# Patient Record
Sex: Female | Born: 1965
Health system: Western US, Academic
[De-identification: ages and names within clinical notes are randomized; demographics above are authoritative.]

---

## 2018-12-03 ENCOUNTER — Ambulatory Visit: Payer: PRIVATE HEALTH INSURANCE

## 2018-12-03 ENCOUNTER — Inpatient Hospital Stay
Admit: 2018-12-03 | Discharge: 2018-12-03 | Disposition: A | Discharge status: Home / Self Care | Payer: PRIVATE HEALTH INSURANCE | Source: Home / Self Care

## 2018-12-03 DIAGNOSIS — R1013 Epigastric pain: Secondary | ICD-10-CM

## 2018-12-03 LAB — Lipase: LIPASE: 38 U/L (ref 9–63)

## 2018-12-03 LAB — Basic Metabolic Panel
ANION GAP: 14 mmol/L (ref 8–19)
GFR ESTIMATE FOR NON-AFRICAN AMERICAN: 54 mL/min/{1.73_m2} (ref 135–146)

## 2018-12-03 LAB — Hepatic Funct Panel: ALBUMIN FORHEPFUNCTPNL: 4.4 g/dL (ref 3.9–5.0)

## 2018-12-03 LAB — B-Type Natriuretic Peptide: BNP: 20 pg/mL (ref <100)

## 2018-12-03 LAB — CK, Total: CREATINE PHOSPHOKINASE, TOTAL: 123 U/L (ref 38–282)

## 2018-12-03 LAB — Differential Automated: LYMPHOCYTE PERCENT, AUTO: 37.8 (ref 0.00–0.10)

## 2018-12-03 LAB — Troponin I: TROPONIN I: <0.04 ng/mL (ref <0.1)

## 2018-12-03 LAB — Prothrombin Time Panel: PROTHROMBIN TIME: 12.5 s (ref 11.5–14.4)

## 2018-12-03 LAB — APTT: APTT: 32.2 s (ref 24.4–36.2)

## 2018-12-03 LAB — CBC: PLATELET COUNT, AUTO: 464 10*3/uL — ABNORMAL HIGH (ref 143–398)

## 2018-12-03 MED ORDER — ALUM & MAG HYDROXIDE-SIMETH 400-400-40 MG/5ML PO SUSP
10 mL | Freq: Four times a day (QID) | ORAL | 0 refills | Status: SS | PRN
Start: 2018-12-03 — End: 2019-06-22

## 2018-12-03 MED ADMIN — ONDANSETRON HCL 4 MG/2ML IJ SOLN: 4 mg | INTRAVENOUS | @ 16:00:00 | Stop: 2018-12-03 | NDC 00641607825

## 2018-12-03 MED ADMIN — ALUM & MAG HYDROXIDE-SIMETH 400-400-40 MG/5ML PO SUSP: 30 mL | ORAL | @ 16:00:00 | Stop: 2018-12-03 | NDC 00121176230

## 2018-12-03 MED ADMIN — LIDOCAINE VISCOUS HCL 2 % MT SOLN: 15 mL | ORAL | @ 16:00:00 | Stop: 2018-12-03 | NDC 50383077517

## 2018-12-03 MED ADMIN — MORPHINE SULFATE (PF) 4 MG/ML IV SOLN: 4 mg | INTRAVENOUS | @ 16:00:00 | Stop: 2018-12-03 | NDC 00641612525

## 2018-12-03 NOTE — ED Notes
COLLECTIVE?NOTIFICATION?12/03/2018 06:57?Donna Gross?MRN: [id number]    [name]:   AVW:[id number]  Account [id number]  Billing Account [id number]      Criteria Met      6 Visits in 180 Days    2 Visits in 30 Days    Security and Safety  No recent Security Events currently on file    ED Care Guidelines  There are currently no ED Care Guidelines for this patient. Please check your facility's medical records system.            E.D. Visit Count (12 mo.)  Facility Visits   [facility] [place] 6   [name] 10   [facility] 1   [place] [place] 15   Total 32   Note: Visits indicate total known visits.      Recent Emergency Department Visit Summary  Showing 10 most recent visits out of 32 in the past 12 months  Date Facility [facility] Type Diagnoses or Chief Complaint   Dec 03, 2018 [facility] [place] [facility]     Dec 01, 2018 [name] [place] Emergency  Chief Complaint: CP, SOB, BACK PAIN    Nov 20, 2018 [facility]. [place] [facility]  Chief Complaint: CHEST PAIN    Oct 29, 2018 [facility] [name] [place] Emergency  Chief Complaint: CHEST, STOMACH, AND BACK PAIN    Sep 22, 2018 [facility] [place]. [place] [facility]  Chief Complaint: CHEST PAIN,SOB    Aug 02, 2018 [facility] [name] [place] Emergency  Chief Complaint: CHEST [facility] PAIN    Jul 01, 2018 [facility] [place] [facility]      Chronic obstructive pulmonary disease w (acute) exacerbation      Hyperlipidemia, unspecified      Ileostomy status      Athscl heart disease of native coronary artery w/o ang pctrs      Nicotine dependence, cigarettes, uncomplicated      Pure hypercholesterolemia, unspecified      Chest pain, unspecified      Jun 21, 2018 [name] [place] Emergency  Chief Complaint: CHEST PAIN    Jun 17, 2018 [facility] [place]. [place] Emergency Allergy status to analgesic agent status      Other chronic pain      Gastro-esophageal reflux disease without esophagitis      Other specified postprocedural states      Other chest pain      Nicotine dependence, cigarettes, uncomplicated      Chronic obstructive pulmonary disease, unspecified      Fibromyalgia      Personal history of peptic ulcer disease      Anxiety disorder, unspecified      Jun 09, 2018 [facility] [place] [facility]      Chronic obstructive pulmonary disease, unspecified      Anxiety disorder, unspecified      Nicotine dependence, cigarettes, uncomplicated      Chest pain, unspecified      Dyspnea, unspecified      Fibromyalgia      Acquired absence of other specified parts of digestive tract      Other chest pain      Allergy status to analgesic agent status      Colostomy status  Recent Inpatient Visit Summary  Date [facility] Type Diagnoses or Chief Complaint   Nov 20, 2018 [facility] [name] [place] Telemetry      Other chest pain      Colostomy status      Gastro-esophageal reflux disease without esophagitis      Major depressive disorder, single episode, unspecified      Other specified counseling      Chronic obstructive pulmonary disease, unspecified      Nicotine dependence, cigarettes, uncomplicated      Gastric ulcer, unsp as acute or chronic, w/o hemor or perf      Allergy status to oth drug/meds/biol subst status      Anxiety disorder, unspecified      Sep 22, 2018 [facility] [place]. [place] Tele[facility]      Essential (primary) hypertension      Major depressive disorder, single episode, unspecified      Generalized anxiety disorder      Emphysema, unspecified      Fibromyalgia      Unspecified asthma, uncomplicated      Pure hypercholesterolemia, unspecified      Nicotine dependence, cigarettes, uncomplicated      Gastro-esophageal reflux disease without esophagitis      Other chest pain Aug 02, 2018 [facility] [place] [place] Tele[facility]      Adult hypertrophic pyloric stenosis      Personal history of peptic ulcer disease      Diaphragmatic hernia without obstruction or gangrene      Nicotine dependence, cigarettes, uncomplicated      Colostomy status      Chronic obstructive pulmonary disease w (acute) exacerbation      Unspecified abdominal pain      Essential (primary) hypertension      Gastritis, unspecified, without bleeding      Gastro-esophageal reflux disease without esophagitis      Apr 17, 2018 [facility] [place] [place] Tele[facility]      Oth disorders of electrolyte and fluid balance, NEC      Anxiety disorder, unspecified      Gastritis, unspecified, without bleeding      Major depressive disorder, single episode, unspecified      Essential (primary) hypertension      Peptic ulc, site unsp, unsp as ac or chr, w/o hemor or perf      Chest pain, unspecified      Other chronic pain      Gastro-esophageal reflux disease without esophagitis      Acute ischemic heart disease, unspecified      Feb 18, 2018 [facility] [place] [facility]      Acute kidney failure, unspecified      Anemia, unspecified      Alcohol-induced chronic pancreatitis      Unspecified chronic gastritis without bleeding      Dehydration      Deficiency of other specified B group vitamins      Acute pancreatitis without necrosis or infection, unsp      Personal history of peptic ulcer disease      Dvrtclos of sm int w/o perforation or abscess w/o bleeding          Care Team  [facility] Phone Fax Service Dates   Coy Saunas , M.D. Family Medicine   Current    Brain Hilts , M.D. Family Medicine   Current    [facility] [phone]  [phone] Aug 21, 2018 - Current    [facility] Primary [facility]   Current    [facility] Primary Care [phone] Aug 21, 2018 - Current      Collective Portal This patient has registered at the [facility] Emergency Department   For more information visit: [email or web address]   PLEASE NOTE:     1.   Any care recommendations and other clinical information are provided as guidelines or for historical purposes only, and providers should exercise their own clinical judgment when providing care.    2.   You may only use this information for purposes of treatment, payment or health care operations activities, and subject to the limitations of applicable Collective Policies.    3.   You should consult directly with the organization that provided a care guideline or other clinical history with any questions about additional information or accuracy or completeness of information provided.    ? 2019 [place], [employer]. - [email or web address]

## 2018-12-04 NOTE — ED Provider Notes
[facility]  Emergency Department Service Report    Donna Gross 52 y.o. female , presents with Chest Pain and Abdominal Pain      Triage   Arrived on 12/03/2018 at 6:57 AM   Arrived by Walk-in [14]    ED Triage Vitals [12/03/18 0700]   Temp Temp Source BP Heart Rate Resp SpO2 O2 Device Pain Score Weight   36.8 ???C (98.2 ???F) Oral 118/92 86 16 98 % None (Room air) Ten --       Pre hospital care:       Allergies   Allergen Reactions   ??? Ibuprofen      GI discomfort         History   The history is provided by the patient and medical records.   Chest Pain   The chest pain began 5 - 7 days ago. Chest pain occurs intermittently. The chest pain is unchanged. At its most intense, the chest pain is at 5/10. The chest pain is currently at 5/10. The severity of the chest pain is moderate. The quality of the pain is described as aching. The pain radiates to the epigastrium. Exacerbated by: eating. Primary symptoms include abdominal pain. Pertinent negatives for primary symptoms include no fever, no fatigue, no syncope, no shortness of breath, no cough, no wheezing, no palpitations, no nausea, no vomiting, no dizziness and no altered mental status.   Pertinent negatives for associated symptoms include no claudication, no diaphoresis, no lower extremity edema, no near-syncope, no numbness, no orthopnea, no paroxysmal nocturnal dyspnea and no weakness. She tried nothing for the symptoms. Risk factors include smoking/tobacco exposure.   Her past medical history is significant for COPD, hypertension and stimulant use.   Procedure history is positive for echocardiogram and exercise treadmill test.              Past Medical History:   Diagnosis Date   ??? Anxiety    ??? Depression    ??? Emphysema, unspecified (HCC/RAF)    ??? Fibromyalgia    ??? Gastritis    ??? GERD (gastroesophageal reflux disease)    ??? Pancreatitis         History reviewed. No pertinent surgical history.     Past Family History family history includes Diabetes in her brother and father; Heart disease in her mother.     Past Social History   she reports that she has been smoking. She does not have any smokeless tobacco history on file. She reports that she does not drink alcohol, use drugs, or engage in sexual activity.     Review of Systems   Constitutional: Negative for diaphoresis, fatigue and fever.   Respiratory: Negative for cough, shortness of breath and wheezing.    Cardiovascular: Positive for chest pain. Negative for palpitations, orthopnea, claudication, syncope and near-syncope.   Gastrointestinal: Positive for abdominal pain. Negative for nausea and vomiting.   Neurological: Negative for dizziness, weakness and numbness.   All other systems reviewed and are negative.      Physical Exam   Physical Exam  Vitals signs and nursing note reviewed.   Constitutional:       General: She is in acute distress.      Appearance: She is well-developed.   HENT:      Head: Normocephalic and atraumatic.   Eyes:      Conjunctiva/sclera: Conjunctivae normal.   Neck:      Musculoskeletal: Normal range of motion and neck supple.  Cardiovascular:      Rate and Rhythm: Normal rate and regular rhythm.   Pulmonary:      Effort: Pulmonary effort is normal. No respiratory distress.   Abdominal:      Palpations: Abdomen is soft.      Tenderness: There is no abdominal tenderness.      Comments: Abdominal surgical scars     Musculoskeletal: Normal range of motion.   Skin:     General: Skin is warm and dry.   Neurological:      Mental Status: She is alert.      Comments: Moving all four extremities         ED Course          Laboratory Results     Labs Reviewed   CBC (PERFORMABLE) - Abnormal; Notable for the following components:       Result Value    Red Cell Distribution Width-SD 51.7 (*)     Platelet Count, Auto 464 (*)     Mean Platelet Volume 8.7 (*)     All other components within normal limits   BNP - Normal   PROTHROMBIN TIME PANEL - Normal APTT - Normal   CK, TOTAL - Normal   LIPASE - Normal   HEPATIC FUNCT PANEL - Normal   CBC & AUTO DIFFERENTIAL    Narrative:     The following orders were created for panel order CBC & Plt & Diff.  Procedure                               Abnormality         Status                     ---------                               -----------         ------                     HYQ[[id number]]                          Abnormal            Final result               Differential, Automated[[id number]]                          Final result                 Please view results for these tests on the individual orders.   BASIC METABOLIC PANEL   TROPONIN I   DIFFERENTIAL, AUTOMATED (PERFORMABLE)       Imaging Results     XR chest ap portable (1 view)   Final Result by Albin Fischer, MD (12/14 0837)   IMPRESSION:   Heart size is normal. Mediastinal contours are unremarkable.   Mild hyperinflation. No acute airspace consolidation or edema.   No pleural effusion or pneumothorax.   No acute osseous abnormality.      Signed by: Albin Fischer   12/03/2018 8:37 AM          Administered Medications     Medication Administration from 12/03/2018 0657 to 12/03/2018 1610  Date/Time Order Dose Route Action Action by Comments     12/03/2018 0740 aluminum-magnesium hydroxide-simethicone 400-400-40 mg/5 mL susp 30 mL 30 mL Oral Given Dalia Heading, RN      12/03/2018 0740 lidocaine viscous 2% oral soln 15 mL 15 mL Oral Given Dalia Heading, RN      12/03/2018 0740 morphine PF 4 mg/mL inj 4 mg 4 mg IV Push Given Dalia Heading, RN      12/03/2018 0740 ondansetron 4 mg/2 mL inj 4 mg 4 mg IV Push Given Dalia Heading, RN           Procedures   Procedures    MDM     My differential diagnosis for Epigastric abdominal pain includes Anginal equivalent, Bowel Obstruction, Cholecystitis, Pancreatitis, Gastritis, Peptic Ulcer Disease, Abdominal Aortic Aneurysm, etc.    Cardiac work up does not demonstrate any evidence of arrhythmias and coronary ischemia.  Trop x 1 was normal.  EKG read by me in real time as no arrhythmias.    Heart Score 0-3 presents low risk for adverse cardiac event (.9-1.7%).     BMP is without electrolyte abnormalities.  No e/o infection.  Afebrile. No significant leukocytosis.  Hemoglobin is stable.  No evidence of active hemorrhage.    LFT's and lipase are normal arguing against hepatobiliary disease and pancreatitis.    CXR read by me in real time as no acute disease.    Patient has a non-surgical abdomen on serial exams. CT Scan of the abdomen and pelvis is not indicated on ER visit to exclude emergent disease process.    All laboratories and tests have been reviewed by me.  This patient has been serially re-evaluated and examined by me during their time in the ER.  The patient is pain free, ambulatory, and tolerating PO on discharge.     I believe that this patient is appropriate for timely outpatient follow up with a primary doctor.  This patient has agreed to adhere to the follow up plan.  They have also agreed to return to the ER immediately if any concerns or worsening symptoms.    Data Reviewed/Counseling: I have reviewed the patient's vital signs, nursing notes, old medical records and outside studies. I had a detailed discussion with the patient regarding the historical points, exam findings, and any diagnostic results supporting the discharge diagnosis. I also discussed lab results, radiology results, the need for outpatient follow-up and the need to return to the ED if symptoms worsen or if there are any questions or concerns that arise at home.          Clinical Impression     1. Abdominal pain, acute, epigastric        Prescriptions     Discharge Medication List as of 12/03/2018  9:27 AM      START taking these medications    Details   aluminum-magnesium hydroxide-simethicone 400-400-40 mg/5 mL suspension Take 10 mLs by mouth every six (6) hours as needed., Starting Sat 12/03/2018, Print Disposition and Follow-up   Disposition: Discharge [1]    No future appointments.    Follow up with:  [facility]  [address] [place] [address]  [phone]    Call in 2 days  for follow up      Return precautions are specified on After Visit Summary.                 Fonnie Mu., MD  12/03/18 [phone]

## 2019-03-09 ENCOUNTER — Ambulatory Visit: Payer: PRIVATE HEALTH INSURANCE

## 2019-03-09 ENCOUNTER — Inpatient Hospital Stay
Admit: 2019-03-09 | Discharge: 2019-03-10 | Disposition: A | Discharge status: Other Acute Inpatient Hospital | Payer: PRIVATE HEALTH INSURANCE | Source: Home / Self Care

## 2019-03-09 DIAGNOSIS — K267 Chronic duodenal ulcer without hemorrhage or perforation: Secondary | ICD-10-CM

## 2019-03-09 DIAGNOSIS — K269 Duodenal ulcer, unspecified as acute or chronic, without hemorrhage or perforation: Secondary | ICD-10-CM

## 2019-03-09 LAB — CBC: NUCLEATED RBC%, AUTOMATED: 0 (ref 0.00–0.00)

## 2019-03-09 LAB — Magnesium: MAGNESIUM: 2.2 meq/L — ABNORMAL HIGH (ref 1.4–1.9)

## 2019-03-09 LAB — Basic Metabolic Panel
ANION GAP: 17 mmol/L (ref 8–19)
CHLORIDE: 98 mmol/L (ref 96–106)

## 2019-03-09 LAB — Extra Red Top (Plastic)

## 2019-03-09 LAB — Troponin I: TROPONIN INTERPRETATION: NEGATIVE ng/mL (ref <0.1)

## 2019-03-09 LAB — Phosphorus: PHOSPHORUS: 4.4 mg/dL (ref 2.3–4.4)

## 2019-03-09 LAB — Hepatic Funct Panel: TOTAL PROTEIN: 7.7 g/dL (ref 6.1–8.2)

## 2019-03-09 LAB — Extra Light Blue Top

## 2019-03-09 LAB — Lipase: LIPASE: 26 U/L (ref 9–63)

## 2019-03-09 MED ORDER — SUCRALFATE 1 G PO TABS
1 g | ORAL_TABLET | Freq: Four times a day (QID) | ORAL | 1 refills | Status: SS
Start: 2019-03-09 — End: 2019-06-24

## 2019-03-09 MED ORDER — PANTOPRAZOLE SODIUM 40 MG PO TBEC
40 mg | ORAL_TABLET | Freq: Two times a day (BID) | ORAL | 1 refills | Status: AC
Start: 2019-03-09 — End: ?

## 2019-03-09 MED ADMIN — LACTATED RINGERS IV BOLUS: 1000 mL | INTRAVENOUS | @ 18:00:00 | Stop: 2019-03-09 | NDC 00338011704

## 2019-03-09 MED ADMIN — ONDANSETRON HCL 4 MG/2ML IJ SOLN: 4 mg | INTRAVENOUS | @ 18:00:00 | Stop: 2019-03-09 | NDC 00641607825

## 2019-03-09 MED ADMIN — PANTOPRAZOLE SODIUM 40 MG IV SOLR: 80 mg | INTRAVENOUS | @ 18:00:00 | Stop: 2019-03-09

## 2019-03-09 MED ADMIN — HYDROMORPHONE HCL 1 MG/ML IJ SOLN: .5 mg | INTRAVENOUS | @ 21:00:00 | Stop: 2019-03-09 | NDC 00409128331

## 2019-03-09 MED ADMIN — LIDOCAINE VISCOUS HCL 2 % MT SOLN: 15 mL | ORAL | @ 18:00:00 | Stop: 2019-03-09 | NDC 50383077517

## 2019-03-09 MED ADMIN — ALUM & MAG HYDROXIDE-SIMETH 400-400-40 MG/5ML PO SUSP: 30 mL | ORAL | @ 18:00:00 | Stop: 2019-03-09 | NDC 00121176230

## 2019-03-09 MED ADMIN — HYDROMORPHONE HCL 1 MG/ML IJ SOLN: .5 mg | INTRAVENOUS | @ 19:00:00 | Stop: 2019-03-09 | NDC 00409128331

## 2019-03-09 MED ADMIN — OXYCODONE HCL 5 MG PO TABS: 10 mg | ORAL | @ 23:00:00 | Stop: 2019-03-09

## 2019-03-09 MED ADMIN — HYDROMORPHONE HCL 1 MG/ML IJ SOLN: .5 mg | INTRAVENOUS | @ 23:00:00 | Stop: 2019-03-09 | NDC 00409128331

## 2019-03-09 MED ADMIN — OXYCODONE HCL 5 MG/5ML PO SOLN: 10 mg | ORAL | @ 23:00:00 | Stop: 2019-03-10

## 2019-03-09 MED ADMIN — HYDROMORPHONE HCL 1 MG/ML IJ SOLN: .5 mg | INTRAVENOUS | @ 18:00:00 | Stop: 2019-03-09 | NDC 00409128331

## 2019-03-09 NOTE — ED Notes
Report from [place], RN for one hour lunch coverage.

## 2019-03-09 NOTE — ED Notes
COLLECTIVE?NOTIFICATION?03/09/2019 10:21?Donna Gross?MRN: [id number]    [facility] Donna Gross's patient encounter information:   AVW:?[id number]  Account [id number]  Billing Account [id number]      Criteria Met      6 Visits in 180 Days    History of Sepsis Dx    2 Visits in 30 Days    Security and Safety  No recent Security Events currently on file    ED Care Guidelines  There are currently no ED Care Guidelines for this patient. Please check your facility's medical records system.    Flags      History of Sepsis - Patient has received Gross diagnosis of Sepsis from an acute or post-acute setting. Apply appropriate clinical planning practices; to learn more visit [email or web address] / Attributed By: [employer] / Attributed On: 12/22/2018           E.D. Visit Count (12 mo.)  Facility Visits   Donna Gross - [place] 2   Donna Gross 11   Donna Gross 2   Donna Gross 5   Total 20   Note: Visits indicate total known visits.      Recent Emergency Department Visit Summary  Showing 10 most recent visits out of 20 in the past 12 months  Date Facility [facility] Type Diagnoses or Chief Complaint   Mar 09, 2019 [facility]. [place] Emergency     Feb 18, 2019 Donna [facility] Gross Donna Gross. [place] Emergency  Chief Complaint: CHEST PAIN,STOMACH PAIN, BACK PAIN    Feb 05, 2019 Donna Gross [facility] [place]. [place] Emergency      Other long term (current) drug therapy      Peptic ulc, site unsp, unsp as ac or chr, w/o hemor or perf      Allergy status to analgesic agent status      Personal history of other diseases of the digestive system      Colostomy status      Epigastric pain      Gastro-esophageal reflux disease without esophagitis      Chronic obstructive pulmonary disease, unspecified      Jan 14, 2019 Donna Gross [place] Emergency  Chief Complaint: ABD PAIN    Dec 31, 2018 Donna Gross Emergency Unspecified abdominal pain      Peptic ulc, site unsp, unsp as ac or chr, w/o hemor or perf      Chronic obstructive pulmonary disease, unspecified      Chest pain, unspecified      Dec 03, 2018 Donna Gross [place] Emergency      1. Epigastric pain      2. Chest Pain      3. 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  Donna Gross Emergency  Chief Complaint: CHEST PAIN    Oct 29, 2018 Donna Gross [facility] Donna Gross. [place] Emergency      Personal history of peptic ulcer disease      Colostomy status      Other specified postprocedural states      Allergy status to analgesic agent status      Nicotine dependence, cigarettes, uncomplicated      Other chest pain      Unspecified abdominal pain      Low back pain      Gastro-esophageal reflux disease without esophagitis      Heart failure, unspecified      Sep 22, 2018 Donna Gross [facility] Emergency  Chief Complaint: CHEST PAIN,SOB        Recent Inpatient Visit Summary  Date Facility [facility] Type Diagnoses or Chief Complaint   Feb 18, 2019 Donna Gross [facility] Donna Gross. [place] Tele[facility]      Anemia, unspecified      Hyperlipidemia, unspecified      Chronic pain syndrome      Acute ischemic heart disease, unspecified      Other long term (current) drug therapy      Nicotine dependence, cigarettes, uncomplicated      Other specified disorders of bladder      Colostomy status      Atherosclerosis of aorta      Bipolar disorder, unspecified Nov 20, 2018 Donna Gross [facility] [place]. [place] [facility]      Other chest pain      Colostomy status      Gastro-esophageal reflux disease without esophagitis      Major depressive disorder, single episode, unspecified      Other specified counseling      Chronic obstructive pulmonary disease, unspecified      Nicotine dependence, cigarettes, uncomplicated      Gastric ulcer, unsp as acute or chronic, w/o hemor or perf      Allergy status to oth drug/meds/biol subst status      Anxiety disorder, unspecified      Sep 22, 2018 [facility] [place]. [place] Tele[facility]      Essential (primary) hypertension      Major depressive disorder, single episode, unspecified      Generalized anxiety disorder      Emphysema, unspecified      Fibromyalgia      Unspecified asthma, uncomplicated      Pure hypercholesterolemia, unspecified      Nicotine dependence, cigarettes, uncomplicated      Gastro-esophageal reflux disease without esophagitis      Other chest pain      Aug 02, 2018 Donna Gross [facility] [place]. [place] Telemetry      Adult hypertrophic pyloric stenosis      Personal history of peptic ulcer disease      Diaphragmatic hernia without obstruction or gangrene      Nicotine dependence, cigarettes, uncomplicated      Colostomy status      Chronic obstructive pulmonary disease w (acute) exacerbation      Unspecified abdominal pain      Essential (primary) hypertension      Gastritis, unspecified, without bleeding      Gastro-esophageal reflux disease without esophagitis      Apr 17, 2018 Donna Gross [facility] [place] [facility]      Oth disorders of electrolyte and fluid balance, NEC      Anxiety disorder, unspecified  Gastritis, unspecified, without bleeding      Major depressive disorder, single episode, unspecified      Essential (primary) hypertension      Peptic ulc, site unsp, unsp as ac or chr, w/o hemor or perf      Chest pain, unspecified      Other chronic pain Gastro-esophageal reflux disease without esophagitis      Acute ischemic heart disease, unspecified          Care Team  [facility] Phone Fax Service Dates   Coy Saunas , M.D. Family Medicine [phone] Nov 20, 2018 - Current    Brain Hilts , M.D. Family Medicine   Current    [facility] Primary Care [phone] Nov 20, 2018 - Current    [facility] Primary Care   Current    [place] [facility] Primary Care   Current      Collective Portal  This patient has registered at the [facility] Emergency Department   For more information visit: [email or web address]   PLEASE NOTE:     1.   Any care recommendations and other clinical information are provided as guidelines or for historical purposes only, and providers should exercise their own clinical judgment when providing care.    2.   You may only use this information for purposes of treatment, payment or health care operations activities, and subject to the limitations of applicable Collective Policies.    3.   You should consult directly with the organization that provided Gross care guideline or other clinical history with any questions about additional information or accuracy or completeness of information provided.    ? 2020 [place], [employer]. - [email or web address]

## 2019-03-09 NOTE — ED Notes
Patient transported to CT 

## 2019-03-09 NOTE — ED Provider Notes
[facility] [place]  Emergency Department Service Report    Donna Gross 53 y.o. female , presents with Abdominal Pain      Triage   Arrived on 03/09/2019 at 10:21 AM   Arrived by Car [5]    ED Triage Vitals [03/09/19 1027]   Temp Temp Source BP Heart Rate Resp SpO2 O2 Device Pain Score Weight   36.4 ???C (97.5 ???F) Oral 100/89 (!) 106 20 100 % None (Room air) Ten 59.4 kg (131 lb)       Pre hospital care:       Allergies   Allergen Reactions   ??? Hydrocodone Other (See Comments)     ''burns a hole in stomach''   ??? Ibuprofen Other (See Comments)     GI discomfort    ''hurts my stomach''       History   53 yo F with duodenal ulcer presents with 1.5 weeks of acute on chronic constant diffuse abdominal pain particularly after taking PO.  Unable to take even liquids given pain and nausea/vomiting.  + NBNB vomiting.  Denies cough, fever, CP, presyncope/syncnope, dysuria, melena, hematochezia.  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  Recently discharged from [facility] where EGD was performed but unable to perform surgery.  Referred to [facility] for surgical intervention.  Seen by Dr. Ave Filter of [facility] who referred her to [facility] ED for admission.      EGD 01/23/19 [facility] hospital)  1. Chronic duodenal  Bulb ulcer  2. Erythema and deformity of the pylorus  3. Antral gastritis  4. Hiatal hernia               Past Medical History:   Diagnosis Date   ??? Anxiety    ??? Depression    ??? Emphysema, unspecified (HCC/RAF)    ??? Fibromyalgia    ??? Gastritis    ??? GERD (gastroesophageal reflux disease)    ??? Pancreatitis         History reviewed. No pertinent surgical history.     Past Family History   family history includes Diabetes in her brother and father; Heart disease in her mother.                 Past Social History   she reports that she has been smoking. She has never used smokeless tobacco. She reports that she does not drink alcohol, use drugs, or engage in sexual activity.     Review of Systems   Constitutional: Negative for chills, diaphoresis, fatigue and fever.   Respiratory: Negative for apnea, cough, choking, chest tightness, shortness of breath, wheezing and stridor.    Cardiovascular: Negative for chest pain, palpitations and leg swelling.   Gastrointestinal: Positive for abdominal pain and nausea. Negative for abdominal distention, blood in stool, constipation, diarrhea and vomiting.   Genitourinary: Negative for decreased urine volume, difficulty urinating, dysuria, flank pain, frequency, hematuria and urgency.   Musculoskeletal: Negative for arthralgias, back pain, gait problem, joint swelling, myalgias, neck pain and neck stiffness.   Skin: Negative for color change, pallor, rash and wound.   Neurological: Negative for dizziness, tremors, seizures, syncope, facial asymmetry, speech difficulty, weakness, light-headedness, numbness and headaches.       Physical Exam   Physical Exam  Vitals signs and nursing note reviewed.   Constitutional:       General: She is in acute distress.  Appearance: Normal appearance. She is well-developed. She is not ill-appearing, toxic-appearing or diaphoretic.   HENT:      Head: Normocephalic and atraumatic.   Eyes:      General: No scleral icterus.        Right eye: No discharge.         Left eye: No discharge.      Conjunctiva/sclera: Conjunctivae normal.   Neck:      Musculoskeletal: Normal range of motion and neck supple.      Thyroid: No thyromegaly.      Vascular: No JVD.      Trachea: No tracheal deviation.   Cardiovascular:      Rate and Rhythm: Normal rate and regular rhythm.      Pulses: Normal pulses.      Heart sounds: Normal heart sounds. No murmur. No friction rub. No gallop.    Pulmonary:      Effort: Pulmonary effort is normal. No respiratory distress.      Breath sounds: Normal breath sounds. No stridor. No wheezing, rhonchi or rales.   Chest:      Chest wall: No tenderness.   Abdominal:      General: Bowel sounds are normal. There is no distension.      Palpations: Abdomen is soft. There is no mass.      Tenderness: There is abdominal tenderness. There is no right CVA tenderness, left CVA tenderness, guarding or rebound.      Hernia: No hernia is present.      Comments: Diffuse tenderness   Musculoskeletal: Normal range of motion.         General: No swelling, tenderness, deformity or signs of injury.      Right lower leg: No edema.      Left lower leg: No edema.   Skin:     General: Skin is warm and dry.      Capillary Refill: Capillary refill takes less than 2 seconds.      Coloration: Skin is not jaundiced or pale.      Findings: No bruising, erythema, lesion or rash.   Neurological:      General: No focal deficit present.      Mental Status: She is alert and oriented to person, place, and time. Mental status is at baseline.         ED Course     ED Course as of Mar 08 1642   Thu Mar 09, 2019   1249 Discussed with surgery - changed CT to without IV contrast and with PO only given AKI.    [GL]   1438 normal sinus rhythm without evidence of ischemia.   ECG 12 lead [GL]   1438 Discussed with surgery whether surgery is technically too complicated for community hospital which would be a likely indication for admission to his hospital.  [facility] unable to do surgery due to complicated nature of surgery.     [GL]   1600 Per surgery, no indication for emergent surgery during this hospitalization.     [GL]   1642 Plan to transfer for likely pre-renal AKI and pain control.     [GL]      ED Course User Index  [GL] Leonides Schanz., MD       Laboratory Results     Labs Reviewed   BASIC METABOLIC PANEL - Abnormal; Notable for the following components:       Result Value    Creatinine 1.65 (*)  All other components within normal limits   CBC - Abnormal; Notable for the following components:    White Blood Cell Count 11.57 (*) MCH Concentration 30.9 (*)     Red Cell Distribution Width-SD 50.2 (*)     Platelet Count, Auto 532 (*)     Mean Platelet Volume 8.3 (*)     All other components within normal limits   HEPATIC FUNCT PANEL - Abnormal; Notable for the following components:    Alanine Aminotransferase 7 (*)     All other components within normal limits   MAGNESIUM - Abnormal; Notable for the following components:    Magnesium 2.2 (*)     All other components within normal limits   LIPASE - Normal   PHOSPHORUS - Normal   TROPONIN I   EXTRA TUBES    Narrative:     The following orders were created for panel order Extra Tubes.  Procedure                               Abnormality         Status                     ---------                               -----------         ------                     Extra Velda Shell QMV[[id number]]                             Final result               Extra Red Top (Plastic)[[id number]]                          Final result                 Please view results for these tests on the individual orders.   EXTRA LIGHT BLUE TOP   EXTRA RED TOP (PLASTIC)   TYPE AND SCREEN       Imaging Results     CT abd+pelvis wo contrast   Final Result by Cephus Shelling., MD (03/19 1304)   IMPRESSION:      No acute findings in the abdomen or pelvis. No evidence for free air or contrast extravasation from bowel.            Signed by: Burman Nieves   03/09/2019 1:04 PM      XR chest ap portable (1 view)   Final Result by Cephus Shelling., MD (03/19 1215)   IMPRESSION:        No large pneumoperitoneum.   Normal cardiomediastinal silhouette.   Hyperinflated lungs. Bronchial wall thickening likely secondary to airways disease. No consolidation.   No pleural effusions. No pneumothorax.    No acute osseous abnormalities.      Dictated by: Jerline Pain   03/09/2019 11:20 AM      I, Burman Nieves, M.D., have reviewed this radiological study personally and I am in full agreement with the findings of the report presented here. Signed by: Burman Nieves   03/09/2019 12:15 PM  Administered Medications     Medication Administration from 03/09/2019 1021 to 03/09/2019 1643       Date/Time Order Dose Route Action Action by Comments     03/09/2019 1059 HYDROmorphone 1 mg/mL inj 0.5 mg 0.5 mg IV Push Given Malissa Hippo, RN      03/09/2019 1355 lactated ringers IV soln bolus 1,000 mL 0 mL Intravenous Stopped Malissa Hippo, RN      03/09/2019 1100 lactated ringers IV soln bolus 1,000 mL 1,000 mL Intravenous New Bag/ Syringe/ Cartridge Malissa Hippo, RN      03/09/2019 1100 pantoprazole inj 80 mg 80 mg IV Push Given Malissa Hippo, RN      03/09/2019 1100 ondansetron 4 mg/2 mL inj 4 mg 4 mg IV Push Given Malissa Hippo, RN      03/09/2019 1100 aluminum-magnesium hydroxide-simethicone 400-400-40 mg/5 mL susp 30 mL 30 mL Oral Given Malissa Hippo, RN      03/09/2019 1100 lidocaine viscous 2% oral soln 15 mL 15 mL Oral Given Malissa Hippo, RN      03/09/2019 1154 HYDROmorphone 1 mg/mL inj 0.5 mg 0.5 mg IV Push Given Malissa Hippo, RN      03/09/2019 1411 HYDROmorphone 1 mg/mL inj 0.5 mg 0.5 mg IV Push Given Malissa Hippo, RN      03/09/2019 1615 oxyCODONE tab 10 mg 10 mg Oral Not Given Joya Gaskins, RN      03/09/2019 1615 oxyCODONE 5 mg/5 mL soln 10 mg 10 mg Oral Not Given Joya Gaskins, RN      03/09/2019 1625 HYDROmorphone 1 mg/mL inj 0.5 mg 0.5 mg IV Push Given Joya Gaskins, RN           Procedures   Procedures    MDM  Number of Diagnoses or Management Options  Duodenal ulcer:   Diagnosis management comments: Given epigastric/diffuse abdominal pain and clinical history, symptoms likely related to duodenal ulcer.  But given patient's comorbidities and epigastric pain, will rule out atypical ACS with trop/EKG.  General surgery already bedside.    After discussion with general surgery who is aware patient unable to be admitted here if stable given her insurance, plan for labs, imaging and symptom control prior to disposition decision.      Data Reviewed/Counseling: I have reviewed the patient's vital signs, nursing notes and old medical records. I had a detailed discussion with the patient regarding the historical points, exam findings, and any diagnostic results supporting the admit diagnosis. I also discussed lab results, radiology results, the need for further workup and treatment in the hospital and the need for transfer to another facility.        Consult: I consulted with surgery regarding consult and need to see patient in the ER and they will see the patient in the ER.      Clinical Impression     1. Duodenal ulcer        Prescriptions     New Prescriptions    No medications on file       Disposition and Follow-up   Disposition: Transfer to Another Facility [2]    No future appointments.    Follow up with:  No follow-up provider specified.    Return precautions are specified on After Visit Summary.                 Leonides Schanz., MD  03/09/19 [phone]

## 2019-03-09 NOTE — ED Notes
Pt provided oral contrast and given instruction to drink 1 cup/ 15 mins.

## 2019-03-09 NOTE — Progress Notes
GENERAL SURGERY CLINIC NOTE    PATIENT: Donna Gross  MRN: [id number]  DOB: July 08, 1966  DATE OF SERVICE: 03/09/2019     PRIMARY CARE PROVIDER: [facility], [place]  REFERRING PROVIDER: Shelly Flatten., MD    Subjective:     History of Present Illness:  Donna Gross is a 53 y.o. 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   She is here for eval of peptic ulcer after recent DC from outside hospital.     # Abdominal pain  - has had ulcers for 7 years, recently worse  - recently admitted to [facility] hospital, for pain with eating/moving, pain wakes her up at night, vomiting, has lost 22 lb weight loss in 2 months  - admitted for IV pain control, DC'd 3/3  - EGD with duodenal ulcer (results below), H.pylori negative  - was told ulcer repair couldn't be down at [facility], so was referred to [facility] for surgical eval  - has been trying omeprazole and pantoprazole, viscous lidocaine, mylanta, bismuth - hasn't been helping  - reports hasn't eaten in 4 days  - says pain is constant, is in distress    Past History:  Past Medical History:   Diagnosis Date   ??? Anxiety    ??? Depression    ??? Emphysema, unspecified (HCC/RAF)    ??? Fibromyalgia    ??? Gastritis    ??? GERD (gastroesophageal reflux disease)    ??? Pancreatitis         Past Surgical History:  No past surgical history on file.    Family History:   Family History   Problem Relation Age of Onset   ??? Heart disease Mother    ??? Diabetes Brother    ??? Diabetes Father        Allergies:   Allergies   Allergen Reactions   ??? Hydrocodone Other (See Comments)     ''burns a hole in stomach''   ??? Ibuprofen Other (See Comments)     GI discomfort    ''hurts my stomach''        Medications:   Current Outpatient Medications   Medication Sig   ??? aluminum-magnesium hydroxide-simethicone 400-400-40 mg/5 mL suspension Take 10 mLs by mouth every six (6) hours as needed.   ??? atorvastatin 40 mg tablet Take 1 tablet (40 mg total) by mouth daily.   ??? bismuth subsalicylate 262 mg chewable tablet Chew 2 tablets (524 mg total) by mouth three (3) times daily before meals and at bedtime.   ??? fluoxetine 10 mg capsule Take 1 capsule (10 mg total) by mouth daily.   ??? hydromorphone 4 mg tablet Take 1 tablet (4 mg total) by mouth every four (4) hours as needed (pain).  Max Daily Amount: 24 mg   ??? metronidazole 500 mg tablet Take 1 tablet (500 mg total) by mouth every six (6) hours.   ??? nicotine 7 mg/24 hr patch Place 1 patch (7 mg total) onto the skin daily.   ??? ondansetron 4 mg tablet Take 1 tablet (4 mg total) by mouth every eight (8) hours as needed for Nausea.   ??? pantoprazole 40 mg DR tablet Take 1 tablet (40 mg total) by mouth two (2) times daily.   ??? quetiapine 100 mg tablet Take 1 tablet (100 mg total) by mouth two (2) times daily.   ??? simethicone 80 mg chewable  tablet Chew 1 tablet (80 mg total) by mouth four (4) times daily as needed for Flatulence.   ??? tetracycline 500 mg capsule Take 1 capsule (500 mg total) by mouth four (4) times daily.     No current facility-administered medications for this visit.        Review of Systems: Review of systems was negative except as noted per HPI.    Objective:     Physical Exam:    Vital Signs: BP 103/74  ~ Pulse (!) 112  ~ Temp 36.2 ???C (97.1 ???F) (Oral)  ~ Ht 5' 4'' (1.626 m)  ~ Wt 131 lb (59.4 kg)  ~ SpO2 95%  ~ BMI 22.49 kg/m???       Gen: alert, appropriate. Appears uncomfortable  HEENT: NCAT, EOMI, MMM  RESP: Non labored respirations, no audible wheezes, no accessory muscle use  ABD: soft, non-distended. +epigastric tenderness. Vertical midline scar from prior surgery.   EXTR: warm and well perfused  NEURO: grossly intact, ambulating hunched over due to pain  PSYCH: normal mood and affect    Studies:  EGD 01/23/19 [facility] hospital)  1. Chronic duodenal  Bulb ulcer 2. Erythema and deformity of the pylorus  3. Antral gastritis  4. Hiatal hernia    H.pylori biopsy negative    Assessment/Plan:     # persistent abdominal pain 2/2 chronic duodenal ulcer  # PO intolerance and unintentional weight loss 2/2 pain  - check gastrin today (currently fasting) to rule out Earna Coder syndrome  - refilled pantoprazole today  - trial sucralfate 1g QID for active ulcer  - pt to obtain prior operative reports from previous ulcer surgeries  - urgent clinic follow up to be coordinated with Dr. [name]    - pt in moderate pain, tachycardia with borderline hypotension, hasn't eaten in 4 days, will refer to ED for IV hydration and pain control.   - please consult general surgery for evaluation   - if drawing labs, please check gastrin level today if patient has not already obtained lab draw prior to ED arrival.     Patient seen and discussed with attending physician Dr. Ave Filter, who agrees with the note above unless otherwise stated in addendum.    Author: Edilia Bo, MD 03/09/2019 9:06 AM , PGY2

## 2019-03-09 NOTE — ED Notes
Pt not agreeable to transfer.  MD notified. Per MD Dr. Shayne Alken, we should wait for surgery Recommendation before transfer.  Transfer AF not signed.  FC aware

## 2019-03-09 NOTE — ED Notes
Pt drank 2 cups oral contrast. Unable to tolerate more due to abdominal pain. CT tech and MD notified.

## 2019-03-09 NOTE — ED Notes
Patient Transfer acknowledgment Form signed  Peer to Peer complete, accepted by Dr. Caryn Section  Pending repatriation to [facility]  Bed#515. ETA [id number]  Clinicals sent to [phone]  RN/ [facility] notified

## 2019-03-10 MED ADMIN — HYDROMORPHONE HCL 1 MG/ML IJ SOLN: .5 mg | INTRAVENOUS | @ 03:00:00 | Stop: 2019-03-10 | NDC 00409128331

## 2019-03-10 MED ADMIN — ONDANSETRON HCL 4 MG/2ML IJ SOLN: 4 mg | INTRAVENOUS | @ 03:00:00 | Stop: 2019-03-10 | NDC 00641607825

## 2019-03-10 NOTE — ED Notes
Report given to Sandra, RN.

## 2019-03-10 NOTE — ED Notes
Report given to Rose, RN

## 2019-03-10 NOTE — ED Notes
Pt updated on plan of care

## 2019-03-10 NOTE — ED Notes
Report to [place], RN [facility].

## 2019-03-10 NOTE — ED Notes
Pt reports tolerable amount of pain at this time. Pt updated on plan of care (awaiting ambulance transport to [facility])

## 2019-03-14 ENCOUNTER — Telehealth: Payer: PRIVATE HEALTH INSURANCE

## 2019-03-14 NOTE — Telephone Encounter
Call Back Request    MD:  Dr. Ave Filter    Reason for call back: Pt states Dr. Ave Filter sent her to ED because of the pain she was in on 03/09/19. The ED could not admit her due to her insurance coverage. Pt states our office needs to send an authorization to her insurance plan stating she needs surgery and hospitalization.     Thank You!    Any Symptoms:  [x]  Yes  []  No      ? If yes, what symptoms are you experiencing:  pain  o Duration of symptoms (how long):  ongoing  o Have you taken medication for symptoms (OTC or Rx):      Patient or caller has been notified of the 24-48 hour turnaround time.

## 2019-04-05 ENCOUNTER — Ambulatory Visit: Payer: PRIVATE HEALTH INSURANCE

## 2019-05-12 ENCOUNTER — Telehealth: Payer: PRIVATE HEALTH INSURANCE

## 2019-05-12 NOTE — Telephone Encounter
Good Morning,  Call Back Request    MD:  Dr. Ave Filter    Reason for call back: Pt is following up on her auth for sx. Please give pt a CB with status.     Thank you.  CBN: [phone]    Any Symptoms:  []  Yes  [x]  No      ? If yes, what symptoms are you experiencing:    o Duration of symptoms (how long):    o Have you taken medication for symptoms (OTC or Rx):      Patient or caller has been notified of the 24-48 hour turnaround time.

## 2019-05-12 NOTE — Telephone Encounter
Call Back Request    MD:  Dr. Ave Filter    Reason for call back: Patient requesting a call back sooner rather than later. Per patient, she really needs to have her surgery. Please assist.    Thank you      Any Symptoms:  []  Yes  [x]  No      ? If yes, what symptoms are you experiencing:    o Duration of symptoms (how long):    o Have you taken medication for symptoms (OTC or Rx):      Patient or caller has been notified of the 24-48 hour turnaround time.

## 2019-05-16 NOTE — Telephone Encounter
Call Back Request    MD:  Dr. Ave Filter    Reason for call back: Pt is calling to follow up on this request. Per Christie Beckers., she is still waiting on information and will call the pt when Berkley Harvey is approved.    Thank You!    Any Symptoms:  []  Yes  [x]  No      ? If yes, what symptoms are you experiencing:    o Duration of symptoms (how long):    o Have you taken medication for symptoms (OTC or Rx):      Patient or caller has been notified of the 24-48 hour turnaround time.

## 2019-05-18 NOTE — Telephone Encounter
Message to Practice/Provider    MD: Ave Filter    Message: pt wants to know status of sx autho. She was info autho request had been sent to insurance, but insurance info pt that is was not. Pt will call Victorino Dike directly.     Thank you    Return call is not being requested by the patient or caller.    Patient or caller has been notified of the 24-48 hour processing turnaround time if applicable.

## 2019-05-18 NOTE — Telephone Encounter
Spoke with patient advised she doesn't want to come in for another office visit to consult with Dr.Chen she is suffering. I advised Victorino Dike is out until Monday 05/22/19, However I would double check with her and I my self or Victorino Dike will call patient back.

## 2019-05-22 ENCOUNTER — Telehealth: Payer: PRIVATE HEALTH INSURANCE

## 2019-05-22 NOTE — Telephone Encounter
Message to Practice/Provider    MD: Dr. [name]    Message: Pt called back with insurance info, phone # [phone] push #[phone] for referral.    Thank You!    Return call is not being requested by the patient or caller.    Patient or caller has been notified of the 24-48 hour processing turnaround time if applicable.

## 2019-05-22 NOTE — Telephone Encounter
Call Back Request    MD:  Dr. [name]    Reason for call back: Pt states she is throwing up and can't eat or sleep and really needs this surgery. Pt spoke with her insurance company and they state they have not received an authorization request for surgery with Dr. [name]. Pt is following up. CBN- [phone].    Thank You!    Any Symptoms:  [x]  Yes  []  No      o If yes, what symptoms are you experiencing:  Vomiting, loss of appetite  o Duration of symptoms (how long):  ongoing  o Have you taken medication for symptoms (OTC or Rx):      Patient or caller has been notified of the 24-48 hour turnaround time.

## 2019-05-25 NOTE — Telephone Encounter
Call Back Request    MD: Dr. [name]    Reason for call back: Patient requesting to speak Victorino Dike in regards to her authorization for her consult. Please assist    Any Symptoms:  []  Yes  [x]  No      ? If yes, what symptoms are you experiencing:    o Duration of symptoms (how long):    o Have you taken medication for symptoms (OTC or Rx):      Patient or caller has been notified of the 24-48 hour turnaround time.

## 2019-05-25 NOTE — Telephone Encounter
auth submitted for Consult with Dr. [name] to [facility]

## 2019-06-01 IMAGING — MG MAMMO BREAST SCREENING BILATERAL
8 of 12 series · 8 of 28 positions shown · non-contrast
Comparison: none

This is a summary report. The complete report is available in the patient's medical record. If you cannot access the medical record, please contact the sending organization for a detailed fax or copy.
EXAM:
Mammogram breast screening tomosynthesis bilateral
REASON FOR EXAM: Breast cancer screening, avg risk, <15% lifetime risk
HISTORY: Patient is 53 y.o. Family medical history includes diabetes in father, heart disease in father, and hypertension in father. No relevant hormone history has been documented for this patient. Surgical history includes appendectomy and hernia repair. Medical history includes thyroid disease.
LMP: No LMP recorded. Patient is postmenopausal.
BREAST COMPOSITION:
The breasts have scattered areas of fibroglandular density.

[R CC]
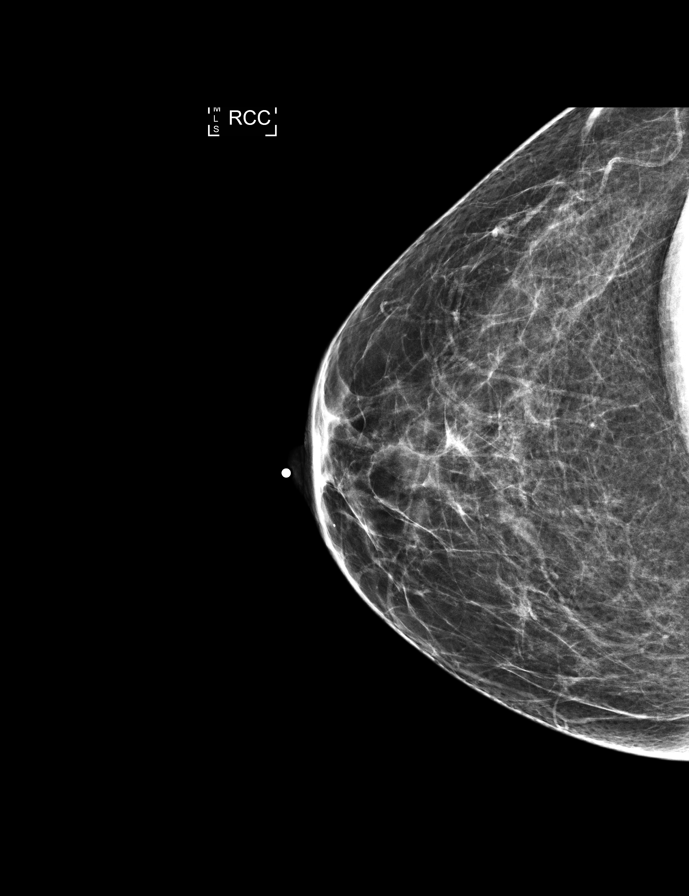

[L CC synth-2D]
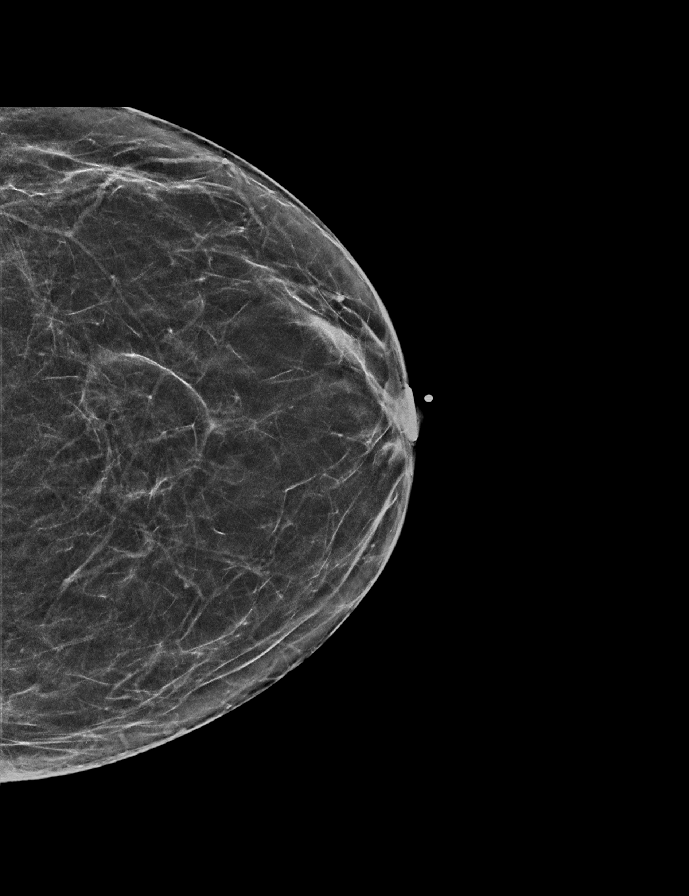

[R MLO]
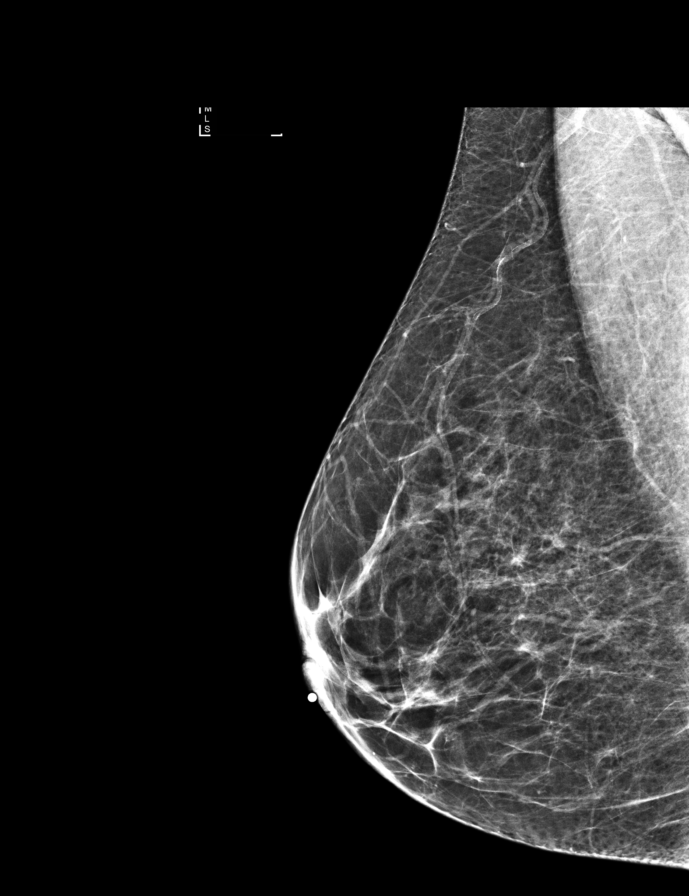

[R MLO synth-2D]
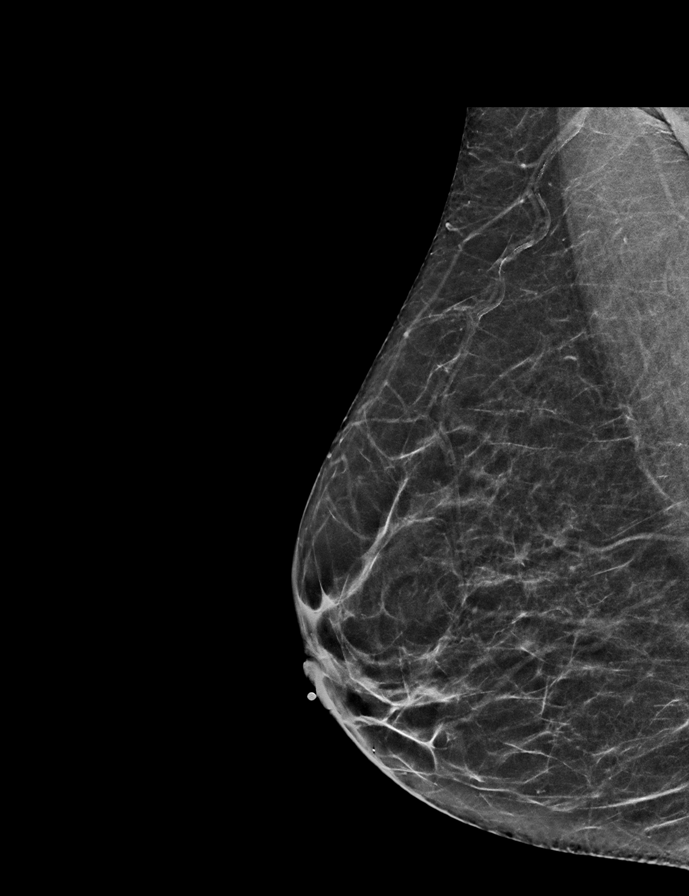

[L CC]
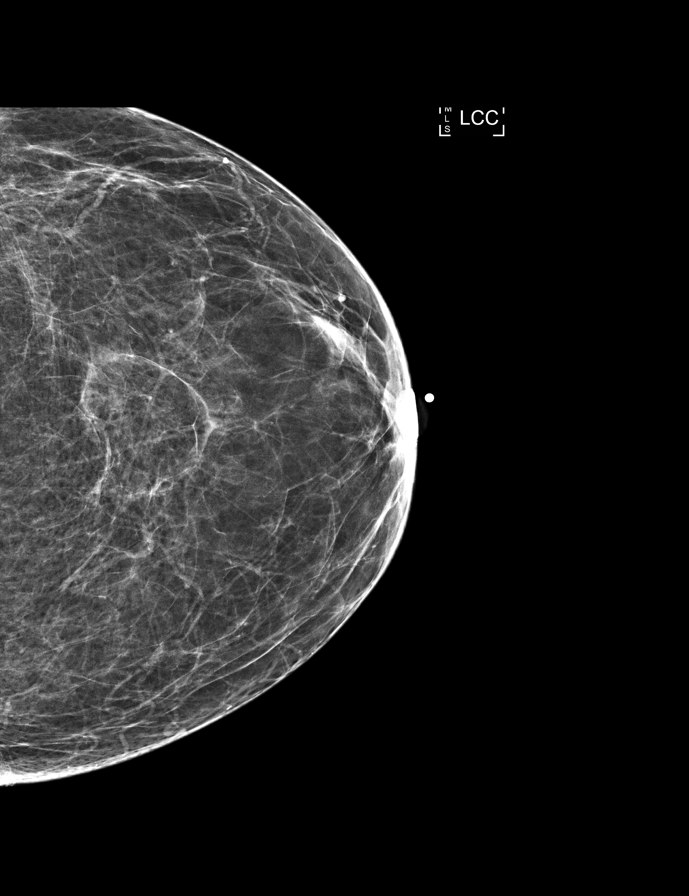

[L MLO synth-2D]
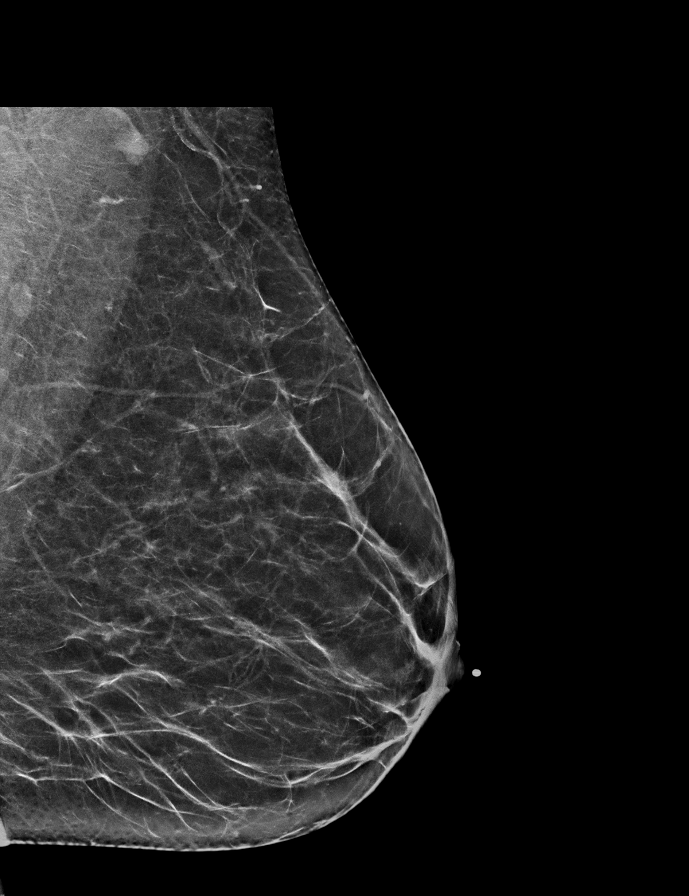

[R CC synth-2D]
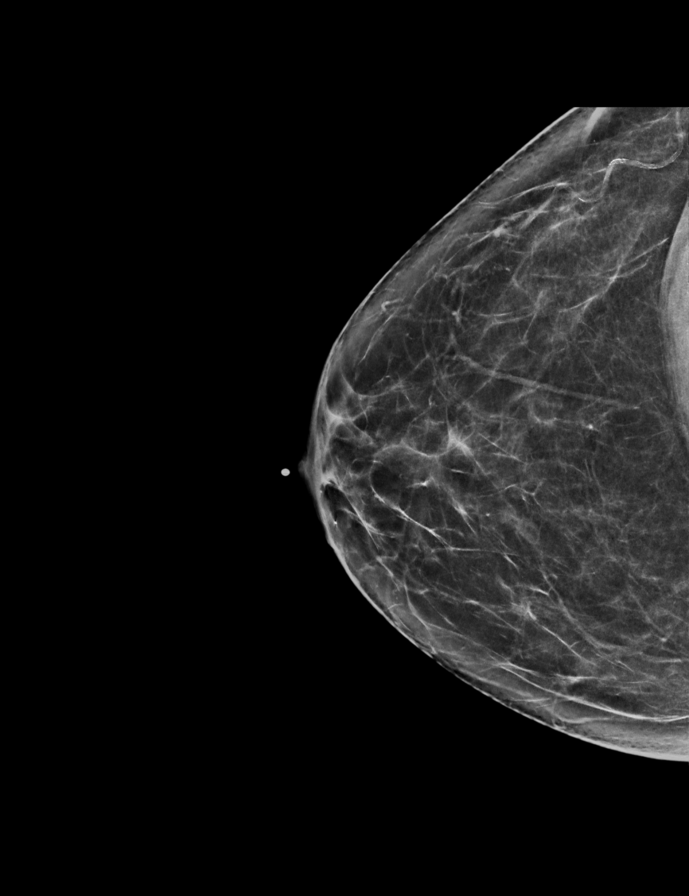

[L MLO]
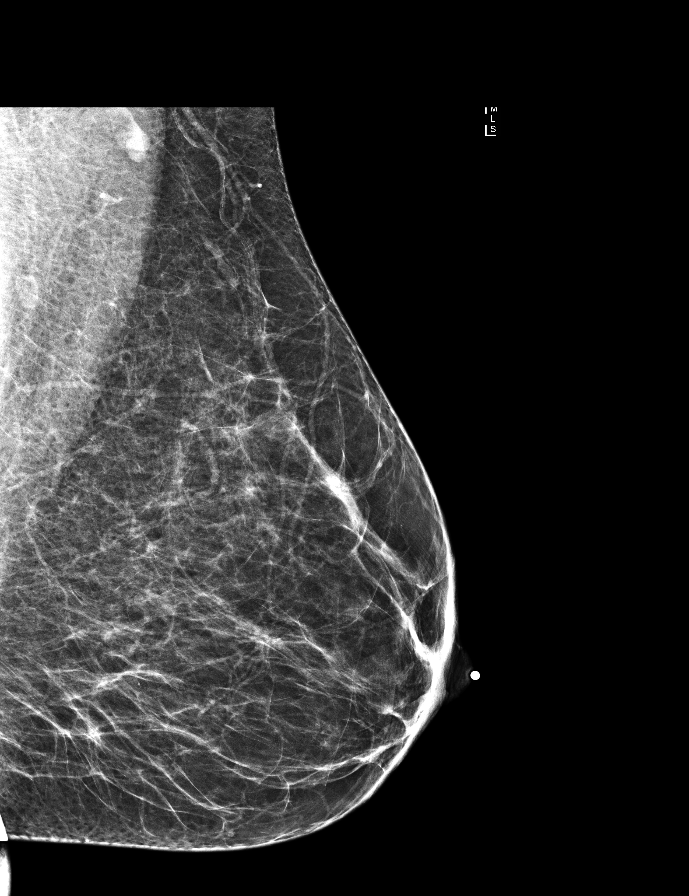

[8 of 28 positions shown; findings below may reference images not displayed]

FINDINGS: Computer-aided detection was utilized by the radiologist in the interpretation of this examination. This procedure was performed using Tomosynthesis.
There are no suspicious masses, calcifications or areas of architectural distortion. No evidence of malignancy.
FILMS COMPARED:
02/24/2017 DIGITAL MAMMO DIAGNOSTIC [id number]
02/24/2017 Mammogram breast diagnostic tomosynthesis bilateral
03/08/2018 SCREEN MAMMO DIR DIGITAL BIALT
IMPRESSION: No mammographic evidence of malignancy.
BI-RADS CATEGORY:
Overall: 1 - Negative
RECOMMENDATION:
- Routine Screening Mammogram in 1 Yr.
LOCATION CODE: 1
Is the patient pregnant?
No

## 2019-06-02 ENCOUNTER — Ambulatory Visit: Payer: PRIVATE HEALTH INSURANCE

## 2019-06-02 DIAGNOSIS — K267 Chronic duodenal ulcer without hemorrhage or perforation: Secondary | ICD-10-CM

## 2019-06-02 NOTE — Consults
GENERAL SURGERY CONSULTATION     PATIENT: Donna Gross  MRN: [id number]  DOB: Jan 10, 1966  DATE OF SERVICE: 06/02/2019     PRIMARY CARE PROVIDER: [facility]    Subjective:     History of Present Illness:  Donna Gross is a 53 y.o. female with complex peptic ulcer history. 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 Went to the OR with Dr. Ave Filter for ex lap, takedown of colocutaneous fistula, takedown of ileostomy with illeal-colic anastomosis, external component release. She was discharged after a prolonged hospital stay - major issues included severe pain requiring PCEA and PCA, c-diff, seroma - to a nursing facility.     She has since been lost to follow-up in our system.     She has had persistent epigastric pain and underwent EGD in February that showed a chronic ulcer in the duodenal bulb.     The patient is now here to discuss possible surgical intervention.      Past Medical History:   Past Medical History:   Diagnosis Date   ??? Anxiety    ??? Depression    ??? Emphysema, unspecified (HCC/RAF)    ??? Fibromyalgia    ??? Gastritis    ??? GERD (gastroesophageal reflux disease)    ??? Pancreatitis        Past Surgical History: No past surgical history on file.    Scheduled Meds:  Current Outpatient Medications   Medication Sig   ??? aluminum-magnesium hydroxide-simethicone 400-400-40 mg/5 mL suspension Take 10 mLs by mouth every six (6) hours as needed.   ??? atorvastatin 40 mg tablet Take 1 tablet (40 mg total) by mouth daily.   ??? bismuth subsalicylate 262 mg chewable tablet Chew 2 tablets (524 mg total) by mouth three (3) times daily before meals and at bedtime.   ??? fluoxetine 10 mg capsule Take 1 capsule (10 mg total) by mouth daily.   ??? hydromorphone 4 mg tablet Take 1 tablet (4 mg total) by mouth every four (4) hours as needed (pain).  Max Daily Amount: 24 mg   ??? metronidazole 500 mg tablet Take 1 tablet (500 mg total) by mouth every six (6) hours.   ??? nicotine 7 mg/24 hr patch Place 1 patch (7 mg total) onto the skin daily.   ??? ondansetron 4 mg tablet Take 1 tablet (4 mg total) by mouth every eight (8) hours as needed for Nausea.   ??? quetiapine 100 mg tablet Take 1 tablet (100 mg total) by mouth two (2) times daily.   ??? simethicone 80 mg chewable tablet Chew 1 tablet (80 mg total) by mouth four (4) times daily as needed for Flatulence.   ??? sucralfate 1 g tablet Take 1 tablet (1 g total) by mouth four (4) times daily.   ??? tetracycline 500 mg capsule Take 1 capsule (500 mg total) by mouth four (4) times daily.     No current facility-administered medications for this visit.        Allergies: Hydrocodone and Ibuprofen    Family History:   Family History   Problem Relation Age of Onset   ???  Heart disease Mother    ??? Diabetes Brother    ??? Diabetes Father        Social History:   Social History     Socioeconomic History   ??? Marital status: Single     Spouse name: Not on file   ??? Number of children: Not on file   ??? Years of education: Not on file   ??? Highest education level: Not on file   Occupational History   ??? Not on file   Social Needs   ??? Financial resource strain: Not on file   ??? Food insecurity:     Worry: Not on file     Inability: Not on file   ??? Transportation needs:     Medical: Not on file     Non-medical: Not on file   Tobacco Use   ??? Smoking status: Current Every Day Smoker   ??? Smokeless tobacco: Never Used   Substance and Sexual Activity   ??? Alcohol use: No   ??? Drug use: No   ??? Sexual activity: Never   Lifestyle   ??? Physical activity:     Days per week: Not on file Minutes per session: Not on file   ??? Stress: Not on file   Relationships   ??? Social connections:     Talks on phone: Not on file     Gets together: Not on file     Attends religious service: Not on file     Active member of club or organization: Not on file     Attends meetings of clubs or organizations: Not on file     Relationship status: Not on file   Other Topics Concern   ??? Not on file   Social History Narrative   ??? Not on file       Review of Systems: A 14 point review of systems was reviewed with the patient. In addition to the pertinent positives noted above. The remainder of the review of systems is negative.      Objective:     Physical Exam:  Last Recorded Vital Signs:    06/02/19 1017   BP: 105/73   Pulse: 88   Temp: 36.8 ???C (98.2 ???F)   SpO2: 93%     Body mass index is 24.22 kg/m???.   Vitals:    06/02/19 1017   Weight: 141 lb 1.6 oz (64 kg)   Height: 5' 4'' (1.626 m)       Vitals:    06/02/19 1017   Weight: 141 lb 1.6 oz (64 kg)   Height: 5' 4'' (1.626 m)       General: The pateint is a well-appearing, well-nourished, in no acute distress.  Head: Normocephalic, atraumatic.   Eyes: Extraocular movements are intact.   Nose: The nose is midline. Mouth and lips: There are no intraoral or cutaneous lesions. Neck: Trachea midline.  Heart: SR  Chest:  There are no adventitious breath sounds.  Abdomen: Multiple prior incisions, feeding tube sites, drain sites; she describes pain with gentle palpation over the left upper quadrant  Extremities:  No clubbing, cyanosis, or edema.    Laboratory Review:  Lab Results   Component Value Date    WBC 11.57 (H) 03/09/2019    HGB 12.9 03/09/2019    HCT 41.8 03/09/2019    MCV 92.7 03/09/2019    PLT 532 (H) 03/09/2019     No results found for this or any previous visit (from the past  24 hour(s)).    Imaging Review: CT abdomen/pelvis in March of this year without acute process    Assessment/Plan:     Donna Gross is a 53 y.o. female with multiple prior surgeries that stemmed from recurrent peptic ulcer disease. She has still not had an actual ulcer procedure. She is taking her PPI consistently. It is unclear if her pain on exam today is related to her ulcer vs. A function of her multiple prior surgeries.     - After lengthy discussion, we have offered the patient an antrectomy, vagotomy, with reconstructive procedure (B2 vs. RNY, to be determined intraoperative based on anatomy), and possible hiatal hernia repair. While this may be achievable laparoscopically there is a high probability it will need to be an open operation.  - Will need acute pain consultation for epidural anesthesia +/- additional adjuncts such as PCA, ketamine gtt etc.        Author: Earl Lites. Merlene Morse, MD 06/02/2019 10:58 AM  General Surgery  [facility]      Addendum: I have seen and examined the patient. I discussed in detail indications and treatment options. I attended the patient's consultation visit with the resident physician. I have reviewed the resident's report and agree with the documented findings and plan of care.     Discussed implications of surgery, complexity, and risks at length. Has had truly refractory ulcer disease and would benefit from a vagotomy and antrectomy along with a hiatal hernia repair if needed. Would attempt to access this from LUQ lap to facilitate the hiatal repair and vagotomy but have a low threshold to reoperate open if needed. We discussed clearly and in detail that pain and left abdominal wall pain may not be consistent with ulcer disease. However, the constellation of symptoms and findings and not inconsistent.   By:  Dineen Kid [name], M.D.

## 2019-06-05 ENCOUNTER — Ambulatory Visit: Payer: PRIVATE HEALTH INSURANCE

## 2019-06-05 ENCOUNTER — Inpatient Hospital Stay: Payer: PRIVATE HEALTH INSURANCE

## 2019-06-05 DIAGNOSIS — K267 Chronic duodenal ulcer without hemorrhage or perforation: Secondary | ICD-10-CM

## 2019-06-06 ENCOUNTER — Ambulatory Visit: Payer: PRIVATE HEALTH INSURANCE

## 2019-06-14 ENCOUNTER — Ambulatory Visit: Payer: PRIVATE HEALTH INSURANCE

## 2019-06-14 DIAGNOSIS — K267 Chronic duodenal ulcer without hemorrhage or perforation: Secondary | ICD-10-CM

## 2019-06-19 ENCOUNTER — Institutional Professional Consult (permissible substitution): Payer: PRIVATE HEALTH INSURANCE

## 2019-06-19 DIAGNOSIS — K267 Chronic duodenal ulcer without hemorrhage or perforation: Secondary | ICD-10-CM

## 2019-06-20 ENCOUNTER — Inpatient Hospital Stay: Admit: 2019-06-20 | Payer: PRIVATE HEALTH INSURANCE | Source: Home / Self Care

## 2019-06-20 ENCOUNTER — Ambulatory Visit: Payer: PRIVATE HEALTH INSURANCE

## 2019-06-20 DIAGNOSIS — R1013 Epigastric pain: Secondary | ICD-10-CM

## 2019-06-20 LAB — COVID-19 PCR: COVID-19 PCR: NOT DETECTED

## 2019-06-20 LAB — Hepatic Funct Panel: ALBUMIN FORHEPFUNCTPNL: 4.4 g/dL (ref 3.9–5.0)

## 2019-06-20 LAB — Amylase: AMYLASE: 75 U/L (ref 31–124)

## 2019-06-20 LAB — Prothrombin Time Panel: INR: 1 s (ref 11.5–14.4)

## 2019-06-20 LAB — Lipase: LIPASE: 16 U/L (ref 9–63)

## 2019-06-20 LAB — Extra Light Green Top

## 2019-06-20 LAB — UA,Dipstick: BILIRUBIN: NEGATIVE (ref 5.0–8.0)

## 2019-06-20 LAB — UA,Microscopic: WBCS: 1 {cells}/uL (ref 0–22)

## 2019-06-20 LAB — Basic Metabolic Panel
UREA NITROGEN: 15 mg/dL (ref 7–22)
UREA NITROGEN: 15 mg/dL (ref 7–22)

## 2019-06-20 LAB — Differential Automated: IMMATURE GRANULOCYTES%: 0.4 (ref 0.00–0.04)

## 2019-06-20 LAB — Troponin I: TROPONIN INTERPRETATION: NEGATIVE ng/mL (ref <0.1)

## 2019-06-20 LAB — CBC: HEMATOCRIT: 38.7 (ref 34.9–45.2)

## 2019-06-20 MED ADMIN — ALUM & MAG HYDROXIDE-SIMETH 400-400-40 MG/5ML PO SUSP: 30 mL | ORAL | @ 19:00:00 | Stop: 2019-06-20 | NDC 00121176230

## 2019-06-20 MED ADMIN — HYDROMORPHONE HCL 1 MG/ML IJ SOLN: 1 mg | INTRAVENOUS | @ 22:00:00 | Stop: 2019-06-20 | NDC 00409128331

## 2019-06-20 MED ADMIN — ONDANSETRON HCL 4 MG/2ML IJ SOLN: 4 mg | INTRAVENOUS | @ 19:00:00 | Stop: 2019-06-20 | NDC 60505613005

## 2019-06-20 MED ADMIN — LIDOCAINE VISCOUS HCL 2 % MT SOLN: 15 mL | ORAL | @ 19:00:00 | Stop: 2019-06-20 | NDC 50383077517

## 2019-06-20 MED ADMIN — HYDROMORPHONE HCL 1 MG/ML IJ SOLN: 1 mg | INTRAVENOUS | @ 19:00:00 | Stop: 2019-06-20 | NDC 00409128331

## 2019-06-20 MED ADMIN — PANTOPRAZOLE SODIUM 40 MG IV SOLR: 40 mg | INTRAVENOUS | @ 19:00:00 | Stop: 2019-06-20 | NDC 00143928410

## 2019-06-20 MED ADMIN — ONDANSETRON HCL 4 MG/2ML IJ SOLN: 4 mg | INTRAVENOUS | @ 22:00:00 | Stop: 2019-06-20 | NDC 60505613005

## 2019-06-20 MED ADMIN — SODIUM CHLORIDE 0.9 % IV BOLUS: 1000 mL | INTRAVENOUS | @ 19:00:00 | Stop: 2019-06-20 | NDC 00338004904

## 2019-06-20 NOTE — ED Notes
Report received from Saralyn Pilar, [place]. Care assumed for lunch relief.

## 2019-06-20 NOTE — ED Notes
I introduced myself to patient/family. Notified of ED delays and plan of care. Armband checked for positive identification. Pt placed in hospital gown. Side rails up. Call light within reach. Pt informed to notify RN of any changes or worsening of symptoms.  Lights dimmed, no additional needs at this time.

## 2019-06-20 NOTE — ED Notes
COLLECTIVE?NOTIFICATION?06/20/2019 11:02?Donna Gross?MRN: [id number]    [name]:   YQM:?[id number]  Account [id number]  Billing Account [id number]      Criteria Met      6 Visits in 180 Days    History of Sepsis Dx    Security and Safety  No recent Security Events currently on file    ED Care Guidelines  There are currently no ED Care Guidelines for this patient. Please check your facility's medical records system.    Flags      History of Sepsis - Patient has received a diagnosis of Sepsis from an acute or post-acute setting. Apply appropriate clinical planning practices; to learn more visit [email or web address] / Attributed By: [facility] / Attributed On: 12/22/2018           E.D. Visit Count (12 mo.)  Facility Visits   [facility] [place] 2   [name] 7   [facility] 3   [facility] 2   Total 14   Note: Visits indicate total known visits.      Recent Emergency Department Visit Summary  Showing 10 most recent visits out of 14 in the past 12 months  Date Facility [facility] Type Diagnoses or Chief Complaint   Jun 20, 2019 [facility] [place] [facility]     Mar 09, 2019 [facility] [place] [facility]      1. Duodenal ulcer, unspecified as acute or chronic, without hemo      2. Abdominal Pain      Feb 18, 2019 [name] [place] [facility]  Chief Complaint: CHEST PAIN,STOMACH PAIN, BACK PAIN    Feb 05, 2019 [facility] [place] [place] [facility]      Other long term (current) drug therapy      Peptic ulcer, site unspecified, unspecified as acute or chron      Allergy status to analgesic agent status      Personal history of other diseases of the digestive system      Colostomy status      Epigastric pain      Gastro-esophageal reflux disease without esophagitis      Chronic obstructive pulmonary disease, unspecified Jan 14, 2019 [name] [place] [facility]  Chief Complaint: ABD PAIN    Dec 31, 2018 [facility] [place] [facility]      Unspecified abdominal pain      Peptic ulcer, site unspecified, unspecified as acute or chron      Chronic obstructive pulmonary disease, unspecified      Chest pain, unspecified      Dec 03, 2018 [facility] [place] [place] [facility]      1. Epigastric pain      2. Chest Pain      3. 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 [place] [facility]  Chief Complaint: CHEST PAIN    Oct 29, 2018 [name] [place] [facility]      Personal history of peptic ulcer disease      Colostomy status      Other specified postprocedural states      Allergy status to analgesic agent status      Nicotine dependence, cigarettes, uncomplicated      Other chest pain      Unspecified abdominal pain      Low back pain      Gastro-esophageal reflux disease without esophagitis      Heart failure, unspecified          Recent Inpatient Visit Summary  Date Facility [facility] Type Diagnoses or Chief Complaint   Feb 18, 2019 [facility] [place] [facility]      Anemia, unspecified      Hyperlipidemia, unspecified      Chronic pain syndrome      Acute ischemic heart disease, unspecified      Other long term (current) drug therapy      Nicotine dependence, cigarettes, uncomplicated      Other specified disorders of bladder      Colostomy status      Atherosclerosis of aorta Bipolar disorder, unspecified      Nov 20, 2018 [facility] [place]. [place] [facility]      Other chest pain      Colostomy status      Gastro-esophageal reflux disease without esophagitis      Major depressive disorder, single episode, unspecified      Other specified counseling      Chronic obstructive pulmonary disease, unspecified      Nicotine dependence, cigarettes, uncomplicated      Gastric ulcer, unspecified as acute or chronic, without hemor      Allergy status to other drugs, medicaments and biological sub      Anxiety disorder, unspecified      Sep 22, 2018 [facility] [place]. [place] [facility]      Essential (primary) hypertension      Major depressive disorder, single episode, unspecified      Generalized anxiety disorder      Emphysema, unspecified      Fibromyalgia      Unspecified asthma, uncomplicated      Pure hypercholesterolemia, unspecified      Nicotine dependence, cigarettes, uncomplicated      Gastro-esophageal reflux disease without esophagitis      Other chest pain      Aug 02, 2018 [facility] [place]. [place] [facility]      Adult hypertrophic pyloric stenosis      Personal history of peptic ulcer disease      Diaphragmatic hernia without obstruction or gangrene      Nicotine dependence, cigarettes, uncomplicated      Colostomy status      Chronic obstructive pulmonary disease with (acute) exacerbati      Unspecified abdominal pain      Essential (primary) hypertension      Gastritis, unspecified, without bleeding      Gastro-esophageal reflux disease without esophagitis          Care Team  [facility] Phone Fax Service Dates   Coy Saunas , M.D. Family Medicine [phone] Current    PEREZ, JOSE , M.D.  Family Medicine   Current    [facility] Saunas [facility]  [phone] Nov 20, 2018 - Current    [facility] Primary Care   Current    [name] Primary Care   Current [facility] Primary Care [phone] Aug 21, 2018 - Current      [facility] Portal  This patient has registered at the [facility] Emergency Department   For more information visit: [email or web address]   PLEASE NOTE:     1.   Any care recommendations and other clinical information are provided as guidelines or for historical purposes only, and providers should exercise their own clinical judgment when providing care.    2.   You may only use this information for purposes of treatment, payment or health care operations activities, and subject to the limitations of applicable [employer] Policies.    3.   You should consult directly with the organization that provided a care guideline or other clinical history with any questions about additional information or accuracy or completeness of information provided.    ? 2020 [place], [employer]. - [email or web address]

## 2019-06-20 NOTE — ED Provider Notes
[facility]  Emergency Department Service Report    Donna Gross 53 y.o. female , presents with Abdominal Pain      Triage   Arrived on 06/20/2019 at 11:02 AM   Arrived by Walk-in [14]    ED Triage Vitals   Temp Temp Source BP Heart Rate Resp SpO2 O2 Device Pain Score Weight   06/20/19 1103 06/20/19 1103 06/20/19 1103 06/20/19 1103 06/20/19 1103 06/20/19 1103 -- 06/20/19 1109 --   36.7 ???C (98 ???F) Forehead 141/87 78 20 98 %  Ten        Pre hospital care:       Allergies   Allergen Reactions   ??? Hydrocodone Other (See Comments)     ''burns a hole in stomach''   ??? Ibuprofen Other (See Comments)     GI discomfort    ''hurts my stomach''       History   Donna Gross is a 53 y.o. female who presents to the ED for evaluation of abdominal pain. Patient has ulcer disease and is scheduled for surgery tomorrow. Patient is unable to take any medications for pain due to surgery scheduled. Reports that her pain is in the upper quadrant, described as pressure, radiates to back and chest and has worsened. Associated brown diarrhea, nausea and vomiting yesterday and this morning. Patient is unable to tolerate any food. Denies fever. Patient is requesting admittance to hospital for pain.       The history is provided by the patient. No language interpreter was used.            Past Medical History:   Diagnosis Date   ??? Anxiety    ??? Depression    ??? Emphysema, unspecified (HCC/RAF)    ??? Fibromyalgia    ??? Gastritis    ??? GERD (gastroesophageal reflux disease)    ??? Pancreatitis         Past Surgical History:   Procedure Laterality Date   ??? ABDOMINAL SURGERY     ??? COLON SURGERY          Past Family History   family history includes Diabetes in her brother and father; Heart disease in her mother.                 Past Social History   she reports that she has been smoking cigarettes. She has been smoking about 0.25 packs per day. She uses smokeless tobacco. She reports that she does not drink alcohol, use drugs, or engage in sexual activity.     Review of Systems   Constitutional: Negative for fever.   HENT: Negative for sore throat.    Eyes: Negative for visual disturbance.   Respiratory: Negative for cough and shortness of breath.    Cardiovascular: Positive for chest pain.   Gastrointestinal: Positive for abdominal pain, diarrhea, nausea and vomiting.   Genitourinary: Negative for dysuria.   Musculoskeletal: Positive for back pain.   Skin: Negative for rash.   Neurological: Negative for headaches.       Physical Exam   Physical Exam  Vitals signs and nursing note reviewed.   Constitutional:       Appearance: She is well-developed.   HENT:      Head: Normocephalic and atraumatic.   Eyes:      Conjunctiva/sclera: Conjunctivae normal.   Neck:      Musculoskeletal: Neck supple.   Pulmonary:      Effort: Pulmonary effort is normal.   Abdominal:  Tenderness: There is no abdominal tenderness.   Musculoskeletal: Normal range of motion.   Skin:     General: Skin is warm and dry.   Neurological:      Mental Status: She is alert and oriented to person, place, and time.         ED Course          Laboratory Results   Labs Reviewed - No data to display    Imaging Results     No orders to display       Administered Medications     Medication Administration from 06/20/2019 1102 to 06/20/2019 1152     None          Procedures   Procedures    MDM  Donna Gross presented with ***  Review of records reveals: ***  ED Course and plan of care: ***      Clinical Impression     1. Acute epigastric pain        Prescriptions     New Prescriptions    No medications on file       Disposition and Follow-up   Disposition: Admit [3]    No future appointments.    Follow up with:  No follow-up provider specified.    Return precautions are specified on After Visit Summary.      The documentation on this chart was performed by Georgia Baria, scribed for Gwyndolyn Kaufman., MD    06/20/2019 11:57 AM     ***

## 2019-06-20 NOTE — H&P
INPATIENT H&P / GENERAL SURGERY / [facility] [phone]    PATIENT:  Donna Gross  MRN:  [id number]  DOB:  [birth year]  DATE OF SERVICE:  06/20/2019    GENERAL SURGERY ATTENDING PHYSICIAN:  Dr. [name]  PRIMARY CARE PHYSICIAN:  [facility], MD    HPI:  Donna Gross is a 53 y.o. female with complex peptic ulcer history who presents to ED with 3 days of N/V and abdominal pain.  She is scheduled for surgery tomorrow with Drs. Chen and [place].    Per patient she has had acute on chronic abdominal pain since 2014.  Most recently the last few days she started to have nausea and emesis that associated with upper quadrant, described as pressure, radiates to back and chest and has worsened.  Also complaints of diarrhea and unable to tolerate solid food however is staying hydrated.    The patients complex surgical hx includes:   ???  08/26/2013: Ex lap, repair of duodenal perf (between 2nd/3rd portions), graham patch  ???  09/01/2014: Ex lap, primary repair of pyloric ulcer with graham patch, g-tube, j-tube  ???  09/10/2014: Ex lap, identification of hole in the distal transverse colon, diverting ileostomy, replacement of J tube  ???  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 Went to the OR with Dr. Ave Filter for ex lap, takedown of colocutaneous fistula, takedown of ileostomy with illeal-colic anastomosis, external component release. She was discharged after a prolonged hospital stay - major issues included severe pain requiring PCEA and PCA, c-diff, seroma - to a nursing facility.   ???  She has since been lost to follow-up in our system.   ???  She has had persistent epigastric pain and underwent EGD in February that showed a chronic ulcer in the duodenal bulb.     She has still not had an actual ulcer procedure. She is taking her PPI consistently. She currently denies fever, chills, constipation, chest pain, SOB, recent illnesses.      ALLERGIES:   Allergies Allergen Reactions   ??? Hydrocodone Other (See Comments)     ''burns a hole in stomach''   ??? Ibuprofen Other (See Comments)     GI discomfort    ''hurts my stomach''       MEDICATIONS:  No current facility-administered medications for this encounter.      Current Outpatient Medications   Medication Sig   ??? aluminum-magnesium hydroxide-simethicone 400-400-40 mg/5 mL suspension Take 10 mLs by mouth every six (6) hours as needed.   ??? atorvastatin 40 mg tablet Take 1 tablet (40 mg total) by mouth daily.   ??? bismuth subsalicylate 262 mg chewable tablet Chew 2 tablets (524 mg total) by mouth three (3) times daily before meals and at bedtime.   ??? fluoxetine 10 mg capsule Take 1 capsule (10 mg total) by mouth daily.   ??? hydromorphone 4 mg tablet Take 1 tablet (4 mg total) by mouth every four (4) hours as needed (pain).  Max Daily Amount: 24 mg   ??? metronidazole 500 mg tablet Take 1 tablet (500 mg total) by mouth every six (6) hours.   ??? nicotine 7 mg/24 hr patch Place 1 patch (7 mg total) onto the skin daily.   ??? ondansetron 4 mg tablet Take 1 tablet (4 mg total) by mouth every eight (8) hours as needed for Nausea.   ??? quetiapine 100 mg tablet Take 1 tablet (100 mg total) by mouth two (  2) times daily.   ??? simethicone 80 mg chewable tablet Chew 1 tablet (80 mg total) by mouth four (4) times daily as needed for Flatulence.   ??? sucralfate 1 g tablet Take 1 tablet (1 g total) by mouth four (4) times daily.   ??? tetracycline 500 mg capsule Take 1 capsule (500 mg total) by mouth four (4) times daily.       PAST MEDICAL HISTORY:   Past Medical History:   Diagnosis Date   ??? Anxiety    ??? Depression    ??? Emphysema, unspecified (HCC/RAF)    ??? Fibromyalgia    ??? Gastritis    ??? GERD (gastroesophageal reflux disease)    ??? Pancreatitis        PAST SURGICAL HISTORY:  Past Surgical History:   Procedure Laterality Date   ??? ABDOMINAL SURGERY     ??? COLON SURGERY         FAMILY HISTORY:  Family History   Problem Relation Age of Onset ??? Heart disease Mother    ??? Diabetes Brother    ??? Diabetes Father    ??? Anesthesia problems Neg Hx    ??? Malignant hypertension Neg Hx    ??? Hypotension Neg Hx    ??? Malignant hyperthermia Neg Hx    ??? Pseudochol deficiency Neg Hx        SOCIAL HISTORY:  Social History     Tobacco Use   ??? Smoking status: Current Every Day Smoker     Packs/day: 0.25     Types: Cigarettes   ??? Smokeless tobacco: Current User   ??? Tobacco comment: 30 years    Substance Use Topics   ??? Alcohol use: No   ??? Drug use: No       REVIEW OF SYSTEMS: A 14 point review of systems has been completed and are negative, except for what is noted in the HPI.     PHYSICAL EXAM:  Vital Signs:  Temp:  [36.7 ???C (98 ???F)] 36.7 ???C (98 ???F)  Heart Rate:  [72-79] 72  Resp:  [16-20] 16  BP: (105-141)/(85-89) 105/85  SpO2:  [96 %-98 %] 96 %  There were no vitals filed for this visit.      GENERAL: Comfortable, NAD.  HEENT: Normocephalic, atraumatic, conjunctiva/corneas clear, moist mucous membranes, no oral ulcerations/lesions.  CARDIOVASCULAR: Regular rate and rhythm  PULMONARY: Normal respiratory excursions, not tachypneic, no labored breathing.   GI: Multiple prior incisions, feeding tube sites, drain sites; she describes pain with gentle palpation over the left upper quadrant  EXT: WWP, no c/c/e  NEURO: A&Ox3, moving all ext.    DETAILED I&O:  No intake/output data recorded.    Intake/Output Summary (Last 24 hours) at 06/20/2019 1655  Last data filed at 06/20/2019 1529  Gross per 24 hour   Intake 1000 ml   Output ???   Net 1000 ml        LAB REVIEW:    WBC/Hgb/Hct/Plts:  8.34/12.4/38.7/478 (06/30 1149)   Na/K/Cl/CO2/BUN/Cr/glu:  137/4.5/101/22/15/1.19/97 (06/30 1149)   Lab Results   Component Value Date    ALT 13 06/20/2019    AST 23 06/20/2019    ALKPHOS 95 06/20/2019    BILITOT <0.2 06/20/2019     PT/INR/APTT/Fib:  13.0/1.0/--/-- (06/30 1150)         MICRO:  Recent Labs     06/20/19  1149   WBC 8.34     Recent Results (from the past 336 hour(s)) COVID-19 PCR, Nasopharyngeal  Collection Time: 06/19/19  3:47 PM   Result Value Ref Range    Specimen Type Respiratory, Upper     COVID-19 PCR Not Detected Not Detected       IMAGING/STUDIES:   Below images were reviewed with surgical team.  Xr Kub Portable (1 View)    Result Date: 06/20/2019  XR KUB 1V PORTABLE CLINICAL HISTORY: pt feel distended, n/v. COMPARISON: CT abdomen and pelvis dated March 09, 2019.     IMPRESSION: Moderate stool burden. Normal bowel gas pattern. No intraperitoneal free air. No acute osseous abnormality. I, Quentin Cornwall, M.D., have reviewed this radiological study personally and I am in full agreement with the findings of the report presented here. Dictated by: Carolina Cellar   06/20/2019 4:12 PM Signed by: Quentin Cornwall   06/20/2019 4:31 PM    ASSESSMENT/PLAN:  Maikayla Beggs is a 53 y.o. female with multiple prior surgeries that stemmed from recurrent peptic ulcer disease who presents to ED with 3 days of N/V and abdominal pain.  She is scheduled for surgery tomorrow with Drs. Chen and [place].  She has had acute on chronic abdominal pain and has truly refractory ulcer disease .    Given scheduled surgery tomorrow, patient will be admitted for observation and hydrated.      - Patient to admit to surgery for observation  - Plan for OR tomorrow: laparoscopic hiatal repair and vagotomy, possible open.  - Clear liquid diet  - NPO at midnight  - Restart home medications  - Please page [phone] (surgery, [place] team) with any questions.     The patient was examined and discussed with General Surgery team & Surgical Attending Dr. [name] who agrees with assessment and plans listed above.    Author:  Katrinka Blazing. Tinslee Klare, NP  4:55 PM 06/20/2019  Division of General Surgery  [facility]

## 2019-06-21 ENCOUNTER — Ambulatory Visit: Payer: PRIVATE HEALTH INSURANCE

## 2019-06-21 LAB — Basic Metabolic Panel
ANION GAP: 11 mmol/L (ref 8–19)
CALCIUM: 9.2 mg/dL (ref 8.6–10.4)

## 2019-06-21 LAB — Magnesium: MAGNESIUM: 1.9 meq/L (ref 1.4–1.9)

## 2019-06-21 LAB — Phosphorus: PHOSPHORUS: 3.2 mg/dL (ref 2.3–4.4)

## 2019-06-21 MED ADMIN — SODIUM CHLORIDE 0.9 % IR SOLN: @ 22:00:00 | Stop: 2019-06-21 | NDC 00338004804

## 2019-06-21 MED ADMIN — CEFAZOLIN SODIUM 1 G IJ SOLR: @ 17:00:00 | Stop: 2019-06-21 | NDC 00143992490

## 2019-06-21 MED ADMIN — KETAMINE HCL 10 MG/ML IJ SOLN: @ 17:00:00 | Stop: 2019-06-21 | NDC 42023011310

## 2019-06-21 MED ADMIN — SODIUM CHLORIDE 0.9 % IV SOLN: @ 18:00:00 | Stop: 2019-06-21 | NDC 08252000014

## 2019-06-21 MED ADMIN — ROCURONIUM BROMIDE 50 MG/5ML IV SOLN: INTRAVENOUS | @ 17:00:00 | Stop: 2019-06-21 | NDC 63323042605

## 2019-06-21 MED ADMIN — METHYLENE BLUE 0.5 % IV SOLN: @ 22:00:00 | Stop: 2019-06-21 | NDC 00517037405

## 2019-06-21 MED ADMIN — ONDANSETRON HCL 4 MG/2ML IJ SOLN: INTRAVENOUS | @ 22:00:00 | Stop: 2019-06-21 | NDC 60505613005

## 2019-06-21 MED ADMIN — FENTANYL CITRATE (PF) 100 MCG/2ML IJ SOLN: INTRAVENOUS | @ 22:00:00 | Stop: 2019-06-21 | NDC 00641602725

## 2019-06-21 MED ADMIN — HYDROMORPHONE HCL 1 MG/ML IJ SOLN: INTRAVENOUS | @ 18:00:00 | Stop: 2019-06-21 | NDC 00409128331

## 2019-06-21 MED ADMIN — LIDOCAINE HCL (CARDIAC) PF 100 MG/5ML IV SOSY: INTRAVENOUS | @ 17:00:00 | Stop: 2019-06-21 | NDC 00409132305

## 2019-06-21 MED ADMIN — PROPOFOL 200 MG/20ML IV EMUL: @ 23:00:00 | Stop: 2019-06-21 | NDC 63323026929

## 2019-06-21 MED ADMIN — PCEA FENTANYL-BUPIVACAINE: 250 mL | EPIDURAL | @ 23:00:00 | Stop: 2019-06-22 | NDC 63323047237

## 2019-06-21 MED ADMIN — ROCURONIUM BROMIDE 50 MG/5ML IV SOLN: INTRAVENOUS | @ 19:00:00 | Stop: 2019-06-21 | NDC 63323042605

## 2019-06-21 MED ADMIN — HYDROMORPHONE HCL 1 MG/ML IJ SOLN: .4 mg | INTRAVENOUS | @ 13:00:00 | Stop: 2019-06-21 | NDC 00409128331

## 2019-06-21 MED ADMIN — MIDAZOLAM HCL 10 MG/10ML IJ SOLN: INTRAVENOUS | @ 17:00:00 | Stop: 2019-06-21 | NDC 00409258705

## 2019-06-21 MED ADMIN — CEFAZOLIN SODIUM 1 G IJ SOLR: @ 19:00:00 | Stop: 2019-06-21 | NDC 00143992490

## 2019-06-21 MED ADMIN — BUPIVACAINE .1%/FENTANYL 2MCG EPIDURAL ANES: 250 mL | EPIDURAL | @ 22:00:00 | Stop: 2019-06-21 | NDC 63323047237

## 2019-06-21 MED ADMIN — PLASMA-LYTE A IV SOLN: INTRAVENOUS | @ 17:00:00 | Stop: 2019-06-21 | NDC 00338022104

## 2019-06-21 MED ADMIN — HYDROMORPHONE HCL 1 MG/ML IJ SOLN: INTRAVENOUS | @ 22:00:00 | Stop: 2019-06-21 | NDC 00409128331

## 2019-06-21 MED ADMIN — CYCLOBENZAPRINE HCL 5 MG PO TABS: 10 mg | ORAL | @ 04:00:00 | Stop: 2019-06-21 | NDC 68084075325

## 2019-06-21 MED ADMIN — HYDROMORPHONE HCL 1 MG/ML IJ SOLN: .4 mg | INTRAVENOUS | @ 09:00:00 | Stop: 2019-06-21 | NDC 00409128331

## 2019-06-21 MED ADMIN — HYDROMORPHONE HCL 1 MG/ML IJ SOLN: .2 mg | INTRAVENOUS | @ 07:00:00 | Stop: 2019-06-21 | NDC 00409128331

## 2019-06-21 MED ADMIN — HYDROMORPHONE HCL 1 MG/ML IJ SOLN: .2 mg | INTRAVENOUS | @ 05:00:00 | Stop: 2019-06-21 | NDC 00409128331

## 2019-06-21 MED ADMIN — HYDROMORPHONE HCL 1 MG/ML IJ SOLN: .2 mg | INTRAVENOUS | @ 03:00:00 | Stop: 2019-06-21 | NDC 00409128331

## 2019-06-21 MED ADMIN — ROCURONIUM BROMIDE 50 MG/5ML IV SOLN: INTRAVENOUS | @ 20:00:00 | Stop: 2019-06-21 | NDC 63323042605

## 2019-06-21 MED ADMIN — PROPOFOL 200 MG/20ML IV EMUL: @ 17:00:00 | Stop: 2019-06-21 | NDC 63323026929

## 2019-06-21 MED ADMIN — ROCURONIUM BROMIDE 50 MG/5ML IV SOLN: INTRAVENOUS | @ 18:00:00 | Stop: 2019-06-21 | NDC 63323042605

## 2019-06-21 MED ADMIN — SUGAMMADEX SODIUM 200 MG/2ML IV SOLN: @ 22:00:00 | Stop: 2019-06-21 | NDC 00006542312

## 2019-06-21 MED ADMIN — DEXTROSE-SODIUM CHLORIDE 5-0.45 % IV SOLN: 100 mL/h | INTRAVENOUS | @ 01:00:00 | Stop: 2019-06-21 | NDC 00338008504

## 2019-06-21 MED ADMIN — LIDOCAINE-EPINEPHRINE 1.5 %-1:200000 IJ SOLN: @ 17:00:00 | Stop: 2019-06-21 | NDC 63323048837

## 2019-06-21 MED ADMIN — HYDROMORPHONE HCL 1 MG/ML IJ SOLN: INTRAVENOUS | @ 17:00:00 | Stop: 2019-06-21 | NDC 00409128331

## 2019-06-21 MED ADMIN — HYDROMORPHONE HCL 1 MG/ML IJ SOLN: .2 mg | INTRAVENOUS | @ 01:00:00 | Stop: 2019-06-21 | NDC 00409128331

## 2019-06-21 MED ADMIN — PROPOFOL 200 MG/20ML IV EMUL: @ 22:00:00 | Stop: 2019-06-21 | NDC 63323026929

## 2019-06-21 MED ADMIN — DEXTROSE-SODIUM CHLORIDE 5-0.45 % IV SOLN: 100 mL/h | INTRAVENOUS | @ 11:00:00 | Stop: 2019-06-21 | NDC 00338008504

## 2019-06-21 MED ADMIN — HYDROMORPHONE HCL 1 MG/ML IJ SOLN: .2 mg | INTRAVENOUS | @ 15:00:00 | Stop: 2019-06-21 | NDC 00409128331

## 2019-06-21 MED ADMIN — DEXTROSE 5 %/LACTATED RINGERS: 75 mL/h | INTRAVENOUS | Stop: 2019-06-26 | NDC 00338012504

## 2019-06-21 MED ADMIN — CEFAZOLIN SODIUM 1 G IJ SOLR: @ 21:00:00 | Stop: 2019-06-21 | NDC 00143992490

## 2019-06-21 MED ADMIN — BUPIVACAINE-EPINEPHRINE (PF) 0.5% -1:200000 IJ SOLN: @ 22:00:00 | Stop: 2019-06-21 | NDC 63323046237

## 2019-06-21 MED ADMIN — DEXAMETHASONE SODIUM PHOSPHATE 4 MG/ML IJ SOLN: INTRAVENOUS | @ 19:00:00 | Stop: 2019-06-21 | NDC 67457042312

## 2019-06-21 MED ADMIN — FENTANYL CITRATE (PF) 100 MCG/2ML IJ SOLN: INTRAVENOUS | @ 17:00:00 | Stop: 2019-06-21 | NDC 00641602725

## 2019-06-21 MED ADMIN — HEPARIN (PORCINE) IN NACL 25000-0.45 UT/250ML-% IV SOLN: @ 20:00:00 | Stop: 2019-06-21 | NDC 63323051774

## 2019-06-21 MED ADMIN — ROCURONIUM BROMIDE 50 MG/5ML IV SOLN: INTRAVENOUS | @ 21:00:00 | Stop: 2019-06-21 | NDC 63323042605

## 2019-06-21 MED ADMIN — QUETIAPINE FUMARATE 100 MG PO TABS: 100 mg | ORAL | @ 04:00:00 | Stop: 2019-07-03 | NDC 00904664061

## 2019-06-21 MED ADMIN — HYDROMORPHONE HCL 1 MG/ML IJ SOLN: .4 mg | INTRAVENOUS | @ 11:00:00 | Stop: 2019-06-21 | NDC 00409128331

## 2019-06-21 MED ADMIN — HYDROMORPHONE HCL 1 MG/ML IJ SOLN: INTRAVENOUS | @ 21:00:00 | Stop: 2019-06-21 | NDC 00409128331

## 2019-06-21 MED ADMIN — ONDANSETRON HCL 4 MG/2ML IJ SOLN: 4 mg | INTRAVENOUS | @ 07:00:00 | Stop: 2019-07-03 | NDC 60505613005

## 2019-06-21 NOTE — Progress Notes
INPATIENT H&P / GENERAL SURGERY / [place] [facility] [phone]    PATIENT:  Donna Gross  MRN:  [id number]  DOB:  22-Nov-1966  DATE OF SERVICE:  06/21/2019    GENERAL SURGERY ATTENDING PHYSICIAN:  Dr. [name]  PRIMARY CARE PHYSICIAN:  [facility] MD    HPI:  Donna Gross is a 53 y.o. female with complex peptic ulcer history who presents to ED with 3 days of N/V and abdominal pain.  She is scheduled for surgery tomorrow with Drs. Chen and [place].    Per patient she has had acute on chronic abdominal pain since 2014.  Most recently the last few days she started to have nausea and emesis that associated with upper quadrant, described as pressure, radiates to back and chest and has worsened.  Also complaints of diarrhea and unable to tolerate solid food however is staying hydrated.    The patients complex surgical hx includes:   ?  08/26/2013: Ex lap, repair of duodenal perf (between 2nd/3rd portions), graham patch  ?  09/01/2014: Ex lap, primary repair of pyloric ulcer with graham patch, g-tube, j-tube  ?  09/10/2014: Ex lap, identification of hole in the distal transverse colon, diverting ileostomy, replacement of J tube  ?  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 Went to the OR with Dr. Ave Filter for ex lap, takedown of colocutaneous fistula, takedown of ileostomy with illeal-colic anastomosis, external component release. She was discharged after a prolonged hospital stay - major issues included severe pain requiring PCEA and PCA, c-diff, seroma - to a nursing facility.   ?  She has since been lost to follow-up in our system.   ?  She has had persistent epigastric pain and underwent EGD in February that showed a chronic ulcer in the duodenal bulb.     She has still not had an actual ulcer procedure. She is taking her PPI consistently. She currently denies fever, chills, constipation, chest pain, SOB, recent illnesses.      Interval Events/Subjective: 06/21/19: Pt with nausea but no emesis overnight.  Has c/o of constant abdominal pain. Pt has been NPO for surgery.    Temp:  [36 ?C (96.8 ?F)-36.8 ?C (98.2 ?F)] 36.3 ?C (97.4 ?F)  Heart Rate:  [66-91] 66  Resp:  [16-20] 16  BP: (105-141)/(73-89) 124/84  NBP Mean:  [88-95] 94  SpO2:  [92 %-99 %] 92 %       I/O last 3 completed shifts:  In: 1725 [P.O.:480; I.V.:245; IV Piggyback:1000]  Out: -     Diets/Supplements/Feeds   Diet    Diet NPO     Start Date/Time: 06/21/19 0000      Number of Occurrences: Until Specified       Scheduled Meds:  ? plasma-lyte-A       ? [MAR Hold] atorvastatin  40 mg Oral Daily   ? [MAR Hold] QUEtiapine  100 mg Oral BID       PRN Meds:  [MAR Hold] acetaminophen, [MAR Hold] albuterol, [MAR Hold] HYDROmorphone, [MAR Hold] HYDROmorphone, [MAR Hold] ondansetron    Exam:  Current BP 124/84  ~ Pulse 66  ~ Temp 36.3 ?C (97.4 ?F) (Forehead)  ~ Resp 16  ~ SpO2 (!) 92%   Gen: NAD  HEENT: NCAT, EOMI, MMM, neck supple  CV: RRR, nl s1 s2  Pulm: Normal respiratory excursions, not tachypneic, no labored breathing.   Abd: Multiple prior incisions, feeding tube sites, drain sites; she describes  pain with gentle palpation over the left upper quadrant  GU: Voiding  Ext: warm, well perfused, no cyanosis, clubbing, or edema  Neuro: A&Ox3  Drains: none  Output by Drain (mL) 06/19/19 0701 - 06/19/19 1900 06/19/19 1901 - 06/20/19 0700 06/20/19 0701 - 06/20/19 1900 06/20/19 1901 - 06/21/19 0700 06/21/19 0701 - 06/21/19 7829   Patient has no LDAs of requested type attached.        Labs:  Recent Labs     06/20/19  1149   HGB 12.4   HCT 38.7   PLT 478*     Recent Labs     06/20/19  1150   PT 13.0   INR 1.0       Recent Labs     06/21/19  0441 06/20/19  1149   CREAT 1.10 1.19   BUN 13 15   NA 138 137   K 4.0 4.5   CL 101 101   CO2 26 22   GLUCOSE 97 97   CALCIUM 9.2 10.0     Recent Labs     06/21/19  0441   MG 1.9   PHOS 3.2     Recent Labs     06/21/19  0441 06/20/19  1149   GLUCOSE 97 97       Recent Labs     06/20/19 1149   TOTPRO 7.7   ALBUMIN 4.4     No results for input(s): PREALBUMIN in the last 72 hours.   Recent Labs     06/20/19  1149   BILITOT <0.2   BILICON <0.2   AST 23   ALT 13   ALKPHOS 95     Recent Labs     06/20/19  1150   INR 1.0     Recent Labs     06/20/19  1149   AMYLASE 75   LIPASE 16       Micro:   Recent Labs     06/20/19  1149   WBC 8.34       Recent Results (from the past 336 hour(s))   COVID-19 PCR, Nasopharyngeal    Collection Time: 06/19/19  3:47 PM   Result Value Ref Range    Specimen Type Respiratory, Upper     COVID-19 PCR Not Detected Not Detected     Imaging:  Xr Kub Portable (1 View)    Result Date: 06/20/2019  IMPRESSION: Moderate stool burden. Normal bowel gas pattern. No intraperitoneal free air. No acute osseous abnormality. I, Quentin Cornwall, M.D., have reviewed this radiological study personally and I am in full agreement with the findings of the report presented here. Dictated by: Carolina Cellar   06/20/2019 4:12 PM Signed by: Quentin Cornwall   06/20/2019 4:31 PM    Assessment/Plan:  Donna Gross is a 53 y.o. female with multiple prior surgeries that stemmed from recurrent peptic ulcer disease who presents to ED with 3 days of N/V and abdominal pain.  She is scheduled for surgery on 7/1 with Drs. Chen and [place].  She has had acute on chronic abdominal pain and has truly refractory ulcer disease.  She is presently Day of Surgery.     #Neuro  - Pain Management: PRN tylenol, dilaudid IVP  - Activity as tolerated    #Pulmonary   - Pulm Toileting: Incentive spirometer     #GI  - Bowel regimen: on hold while NPO/pre-op  - Nutrition orders: Diet NPO, maintain IVF     #GU:   -  Voiding    #ID/ Heme  - WBC and HCT stable  - ABX: none     #PPx   - SCDs pre-op    #Disposition  - D/C home when tolerating regular diet and pain is controlled on PO pain regimen.  - No home health needs anticipated.  - Please page [phone] (surgery, [place] team) with any questions.  - Code Status: Full Code Patient seen with General Surgery Team and plan of care discussed with Attending, Alonna Buckler., MD, who is in agreement.     Author:    Katrinka Blazing. Tayli Buch, NP  8:42 AM 06/21/2019  Division of General Surgery  [facility]

## 2019-06-21 NOTE — ED Notes
Report given to melanie rn 

## 2019-06-21 NOTE — Other
Patients Clinical Goal:   Clinical Goal(s) for the Shift: VSS, safety, pain control, NPO at midnight  Identify possible barriers to advancing the care plan: n/a  Stability of the patient: Moderately Stable - low risk of patient condition declining or worsening   End of Shift Summary:     Review of Systems:  Gen: calm, cooperative; NAD  Neuro: aox4  CV: BP/HR WNL  Resp: Remains on RA with O2 sat >93%.  GI: c/o constant ABD pain. Medicated w/ dilaudid IVP q2h. Also had n/v last night. Medicated w/ zofran x1.  GU: Voids spontaneously.  Diet: NPO since midnight    BP 120/73  ~ Pulse 73  ~ Temp 36 C (96.8 F) (Axillary)  ~ Resp 20  ~ SpO2 97%     Plan of Care:  IP Preop checklist started. CHG bath done this AM.  Pt going for surgery at 0830.   Pain mgmt. Nausea mgmt.   Otherwise, VSS  Pt free from falls and any injuries. Hourly rounding done.  Will continue to monitor and continue plan of care. Will endorse to oncoming RN.

## 2019-06-21 NOTE — H&P
UPDATED H&P REQUIREMENT    For [facility] [place] [facility] and [name]    WHAT IS THE STATUS OF THE PATIENT'S MOST CURRENT HISTORY AND PHYSICAL?   - The most current H&P was performed within the past 24 hours. No additional updated H&P documentation is necessary.     REFER TO MEDICAL STAFF POLICIES REGARDING PRE-PROCEDURE HISTORY AND PHYSICAL EXAMINATION AND UPDATED H&P REQUIREMENTS BELOW:    [place] [facility] and [name] Medical Staff Policy 200 - For Patients Undergoing Procedures Requiring Moderate or Deep Sedation, General Anesthesia or Regional Anesthesia    Contents of a History and Physical Examination (H&P):    The H&P shall consist of chief complaint, history of present illness, allergies and medications, relevant social and family history, past medical history, review of systems and physical examination, and assessment and plan appropriate to the patient's age.    For Patients Undergoing Procedures Requiring Moderate or Deep Sedation, General Anesthesia or Regional Anesthesia:    1. An H&P shall be performed within 24 hours prior to the procedure by a qualified member of the medical staff or designee with appropriate privileges, except as noted in item 2 below.    2. If a complete history and physical was performed within thirty (30) calendar days prior to the patient's admission to the Medical Center for elective surgery, a member of the medical staff assumes the responsibility for the accuracy of the clinical information and will need to document in the medical record within twenty-four (24) hours of admission and prior to surgery or major invasive procedure, that they either attest that the history and physical has been reviewed and accepted, or document an update of the original history and physical relevant to the patient's current clinical status.    3. Providing an H&P for  patients undergoing surgery under local anesthesia is at the discretion of the Attending Physician.     4. When a procedure is performed by a dentist, podiatrist or other practitioner who is not privileged to perform an H&P, the anesthesiologist's assessment immediately prior to the procedure will constitute the 24 hour re-assessment.The dentist, podiatrist or other practitioner who is not privileged to perform an H&P will document the history and physical relevant to the procedure.    5. If the H&P and the written informed consent for the surgery or procedure are not recorded in the patient's medical record prior to surgery, the operation shall not be performed unless the attending physician states in writing that such a delay could lead to an adverse event or irreversible damage to the patient.    6. The above requirements shall not preclude the rendering of emergency medical or surgical care to a patient in dire circumstances.

## 2019-06-21 NOTE — OR Nursing
The patient's daughter was called and updated at the start of the procedure.

## 2019-06-21 NOTE — OR Nursing
Patient's daughter Corporate treasurer) updated by phone call.

## 2019-06-21 NOTE — H&P
UPDATED H&P REQUIREMENT    For [facility] [place] [facility] and [name]    WHAT IS THE STATUS OF THE PATIENT'S MOST CURRENT HISTORY AND PHYSICAL?   - The most current H&P was performed within the past 24 hours. No additional updated H&P documentation is necessary.     REFER TO MEDICAL STAFF POLICIES REGARDING PRE-PROCEDURE HISTORY AND PHYSICAL EXAMINATION AND UPDATED H&P REQUIREMENTS BELOW:    [place] [facility] and [name] Medical Staff Policy 200 - For Patients Undergoing Procedures Requiring Moderate or Deep Sedation, General Anesthesia or Regional Anesthesia    Contents of a History and Physical Examination (H&P):    The H&P shall consist of chief complaint, history of present illness, allergies and medications, relevant social and family history, past medical history, review of systems and physical examination, and assessment and plan appropriate to the patient's age.    For Patients Undergoing Procedures Requiring Moderate or Deep Sedation, General Anesthesia or Regional Anesthesia:    1. An H&P shall be performed within 24 hours prior to the procedure by a qualified member of the medical staff or designee with appropriate privileges, except as noted in item 2 below.    2. If a complete history and physical was performed within thirty (30) calendar days prior to the patient's admission to the Medical Center for elective surgery, a member of the medical staff assumes the responsibility for the accuracy of the clinical information and will need to document in the medical record within twenty-four (24) hours of admission and prior to surgery or major invasive procedure, that they either attest that the history and physical has been reviewed and accepted, or document an update of the original history and physical relevant to the patient's current clinical status.    3. Providing an H&P for  patients undergoing surgery under local anesthesia is at the discretion of the Attending Physician.     4. When a procedure is performed by a dentist, podiatrist or other practitioner who is not privileged to perform an H&P, the anesthesiologist's assessment immediately prior to the procedure will constitute the 24 hour re-assessment.The dentist, podiatrist or other practitioner who is not privileged to perform an H&P will document the history and physical relevant to the procedure.    5. If the H&P and the written informed consent for the surgery or procedure are not recorded in the patient's medical record prior to surgery, the operation shall not be performed unless the attending physician states in writing that such a delay could lead to an adverse event or irreversible damage to the patient.    6. The above requirements shall not preclude the rendering of emergency medical or surgical care to a patient in dire circumstances.

## 2019-06-21 NOTE — Interdisciplinary
Safe Transition Progression Note  utilizing BOOST (Better Outcomes by Optimizing Safe Transitions)    Name: Donna Gross  MRN: [id number]  DOB: 1966/07/31    Date of Admit:     Anticipated Date of Discharge: 06/22/2019    Anticipated Disposition:      Primary Care Provider: [facility], [facility]  Attending Physician: Alonna Buckler., MD  Physician Team: Surgery    LACE+ Score: LACE+ Score: 30    Total Score Risk Stratification   0-28 Minimal Risk   29-58 Moderate Risk   59-78 High Risk   79-90 Highest Risk     BOOST Elements Best Practice / Interventions Status   RN   P1 # Problems with Medications Teach back needs indicated for high risk medication(s) [x]  N/A  []  Insulin   []  Anticoagulants    []  Opioids  []  Immunosuppressive drugs    Teach back completed for [x]  N/A  []  Insulin   []  Anticoagulants: ____  []  Opioids:____  []  Immunosuppressive drugs: ____  []  Pending    Requested for DM educator consultation [x]  N/A  []  Yes  []  No    Discharge medication received  [x]  N/A  []  Yes  []  Pending    Provided patient education on high risk medication for discharge [x]  N/A  []  Yes  []  Pending    Completed assessment of primary learner's understanding on high risk discharge medications [x]  N/A  []  Yes  []  No   P2 # Psychological [place] Suicide Risk Assessment No risk    Requested for SW consultation for other needs: [x]  N/A  []  Patient: _____    []  Insufficient care giver   []  Meals at home  []  Financial needs    []  Transportation to appointments    []  Higher care needs   P3 # Principal Diagnosis SpO2 post ambulation off oxygen documented (w/in 48hrs of discharge) [x]  N/A   []  Yes: ___% at ___ (date) ____ (time)    []  No    Educated provided for [x]  N/A         []  Drain: _____    []  Enteral feeding    []  Foley catheter  []  Leg bag    []  PICC      []  PortACath   []  Oxygen  []   PleurX  []  Wound Vac  []  No   P4 # Physical Limitations Requested for PT consultation from MD [x]  N/A  []  Yes  []  No Requested for SLP consultation from MD [x]  N/A  []  Yes  []  No    Confirmed discharge DME delivery to bedside [x]  N/A  []  FWW  []  Wheel chair  []  Cane  []  Crutches  []  AVAP  []  Nebulizer   []  Portable oxygen  []  Wound Vac  []  No    Identified transportation for discharge [x]  Family/friend  []  Taxi/Uber   []  Ambulance []  ACLS  []  RN  []  RT   []  Non-emergent transportation   []  Other: _____   P5 # Health Literacy Primary learner is  []  Patient  []  Other: Name: __________  Relationship: __________  Contact number: ________   P6 # Patient Support Identified primary caregiver []  Patient []  Name:______& relationship of other: ______   MD/NP   P1 # Problems with Medications Preferred Pharmacy   [facility] [place], [place] - [id number]  [phone] [place] [address]  Pre-authorization for discharge medication ordered  []  N/A []  Yes for ______      Verified pre-authorization approval []  N/A []  Yes for ______    []  No _______   P3 # Principal Diagnosis Anticipated discharge destination []  Home []  Facility    Completed orders for medical care needs []  N/A  []  HH RN    []  HH Med Rec  []  Home IV antibiotics   []  HH IV care     []  HH labs    []  HH TPN  []  HH enteral feeding  []  HH drain care   []  HH urinary catheter  []  HH wound care []  PT/OT  []  Other:_______  []  Pending    U Breathe Navigator consultation requested for []  N/A  []  COPD  []  Pneumonia    []  Bronchiectasis  []  Cystic fibrosis  []  Asthma  []  Reactive airway disease  []  Other chronic lung disease  []  Pending    Entered orders for DME []  N/A   []  Portable oxygen /concentrator    []  Nebulizer            []  AVAP    []  Suction equipment  []  PleurX supplies  []  Wound Vac    []  Other: _______  []  Pending    Hospice consultation requested []  N/A  []  Yes  []  No ______   P4 # Physical Limitations DME order filled for []  N/A  []  FWW  []  Wheel chair  []  Cane    []  Crutches  []  3-in-1 Bedside commode []  Tub bench  []  Bed  []  Other:______  []  Pending   P6 # Patient Support Order placed for HH SW []  N/A  []  Yes  []  No _____    Order placed for Home Health Aide []  N/A  []  Yes    P7 # Prior Hospitalizations Identified unplanned readmission within 30 days []  Yes  []  No    Submitted referral for Ambulatory Care Coordination for patients with [place] PCP []  N/A  []  Yes    P8 # Palliative Care Submitted referral for Palliative Care team for  []  N/A  []  Uncontrolled symptoms    []  Need of discussion for care goal   []  No ____   Inpatient Clinical Pharmacist   P1 # Problems with Medications Consultation provided   (Polypharmacy >15 or high risk medications upon discharge) []  Yes for high risk medication: ___ (name)  []  Medication reconciliation upon admission  []  Discharge medication reconciliation  []  Pending    Case Management   P3 # Principal Diagnosis & Disposition place identified []  Home  []  Family: ____________    []  Friend: __________   []  Acute rehab  []  Drug rehab  []  Rehab    []  LTAC  []  SNF  []  Subacute   []  Recuperative care []  Shelter  []  Hospital: ____  []   RR  []  NPH   P4 # Physical Limitations Submitted for HH services for []  N/A   []  HH RN         []  HH Med Rec  []  Home IV antibiotics   []  HH IV care   []  HH labs                     []  HH TPN     []  HH enteral feeding  []  HH drain care   []   HH urinary catheter  []  HH wound care   []  PT ? OT    []   Other _____  []  No    Arranged outpatient dialysis center []  N/A  []  Setup  []  Resume    []  Chair time location  []  No    Submitted DME for approval for []  N/A  []  Portable oxygen /concentrator   []  Nebulizer     []  AVAP    []  Suction equipment []  PleurX supplies   []  Wound Vac  []  Other _____  []  No     Requested DME for delivery to home []  N/A []  FWW ? Wheel chair  []  Cane   []  Crutches  []  3-in-1 Bedside commode  []  Tub bench  []  Bed   []  Oxygen concentrator  []  PleurX supplies  []  No     Requested DME for delivery to hospital []  N/A  []  FWW  []  Wheel chair  []  Cane  []  Crutches    []  3-in-1 Bedside commode   []  Tub bench   []  Portable oxygen   []  Wound Vac  []  No     Submitted approval for discharge transportation by []  N/A  []  Ambulance  []  ACLS  []  RT    []  RN   []  Non-emergent transportation   []  No   P3 # Principal Diagnosis &   P4 # Physical Limitations  P6 # Patient Support Identified barriers for care at home []  N/A  []  Patient _____   []  Family refusal _____  []  Insufficient care giver   []  Conservatorship  []  Meals at home  []  Financial needs    []  Stairs at home  []  Transportation to appointments  []  Transportation for discharge  []  Higher care needs    []  Other:_____   P8 # Palliative Care Verified consent for hospice []  N/A  []  Yes  []  No   Designer, fashion/clothing # Psychological Consultation provided []  N/A  []  Yes  []  Pending    P6 # Patient Support Consulted for []  N/A  []  Housing  []  Financial concerns   []  Other:_____   RT   P3 #Principal Diagnosis Consultation provided []  N/A  []  COPD  []  Pneumonia    []  Bronchiectasis  []  Cystic fibrosis  []  Asthma  []  Reactive airway disease  []  Other chronic lung disease  []  Pending    Discharge DME requested []  AVAP  []  Nebulizer  []  Pending   P6 # Patient Support U Breathe Navigator indicated for []  N/A  []  Breathing treatment needs upon discharge  []  DME for breathing treatment upon discharge   PT/OT   P4 # Physical Limitations Discharge recommendation   ,      Discharge DME recommended

## 2019-06-21 NOTE — Progress Notes
[facility] worker, international aid - Emergency Department Medication History Review    I reviewed the home medication list for Donna Gross, (MRN: [id number]) which has been updated by Tylene Fantasia, PhT (pharmacy technician). Medication names, doses, and instructions were reviewed for accuracy. Additional medication notes were added to the home medication list for clarification and are documented in yellow under each medication entry. The final medication reconciliation will be completed by the floor clinical pharmacist upon admission to the inpatient unit.    Kathi Ludwig, PharmD. 06/20/2019, 8:13 PM  Clinical Pharmacist - Emergency Department  Ext. [phone]

## 2019-06-22 DIAGNOSIS — K279 Peptic ulcer, site unspecified, unspecified as acute or chronic, without hemorrhage or perforation: Secondary | ICD-10-CM

## 2019-06-22 LAB — Magnesium: MAGNESIUM: 2.3 meq/L — ABNORMAL HIGH (ref 1.4–1.9)

## 2019-06-22 LAB — CBC: NUCLEATED RBC%, AUTOMATED: 0 (ref 31.5–35.5)

## 2019-06-22 LAB — Basic Metabolic Panel
ANION GAP: 13 mmol/L (ref 8–19)
GFR ESTIMATE FOR NON-AFRICAN AMERICAN: 38 mL/min/{1.73_m2} (ref 8.6–10.4)

## 2019-06-22 LAB — Differential Automated: ABSOLUTE NEUT COUNT: 10.37 10*3/uL — ABNORMAL HIGH (ref 1.80–6.90)

## 2019-06-22 LAB — Phosphorus: PHOSPHORUS: 3.6 mg/dL (ref 2.3–4.4)

## 2019-06-22 MED ADMIN — SODIUM CHLORIDE 0.9 % IV BOLUS: 500 mL | INTRAVENOUS | @ 23:00:00 | Stop: 2019-06-22 | NDC 00338954304

## 2019-06-22 MED ADMIN — ACETAMINOPHEN 10 MG/ML IV SOLN: 1000 mg | INTRAVENOUS | @ 15:00:00 | Stop: 2019-06-23

## 2019-06-22 MED ADMIN — PANTOPRAZOLE SODIUM 40 MG IV SOLR: 40 mg | INTRAVENOUS | @ 04:00:00 | Stop: 2019-06-28 | NDC 00143928410

## 2019-06-22 MED ADMIN — HYDROMORPHONE HCL 1 MG/ML IJ SOLN: .4 mg | INTRAVENOUS | @ 17:00:00 | Stop: 2019-06-22 | NDC 00409128331

## 2019-06-22 MED ADMIN — HYDROMORPHONE HCL 1 MG/ML IJ SOLN: .4 mg | INTRAVENOUS | @ 13:00:00 | Stop: 2019-06-22 | NDC 00409128331

## 2019-06-22 MED ADMIN — HYDROMORPHONE HCL 1 MG/ML IJ SOLN: .4 mg | INTRAVENOUS | @ 15:00:00 | Stop: 2019-06-22 | NDC 00409128331

## 2019-06-22 MED ADMIN — PCEA FENTANYL-BUPIVACAINE: 250 mL | EPIDURAL | @ 20:00:00 | Stop: 2019-06-25 | NDC 63323047237

## 2019-06-22 MED ADMIN — PCEA FENTANYL-BUPIVACAINE: 250 mL | EPIDURAL | @ 02:00:00 | Stop: 2019-06-22

## 2019-06-22 MED ADMIN — HYDROMORPHONE HCL 1 MG/ML IJ SOLN: .4 mg | INTRAVENOUS | @ 10:00:00 | Stop: 2019-06-22 | NDC 00409128331

## 2019-06-22 MED ADMIN — QUETIAPINE FUMARATE 100 MG PO TABS: 100 mg | ORAL | @ 17:00:00 | Stop: 2019-07-03

## 2019-06-22 MED ADMIN — HYDROMORPHONE HCL 1 MG/ML IJ SOLN: .4 mg | INTRAVENOUS | @ 08:00:00 | Stop: 2019-06-22 | NDC 00409128331

## 2019-06-22 MED ADMIN — QUETIAPINE FUMARATE 100 MG PO TABS: 100 mg | ORAL | @ 04:00:00 | Stop: 2019-07-03

## 2019-06-22 MED ADMIN — HYDROMORPHONE HCL 1 MG/ML IJ SOLN: .4 mg | INTRAVENOUS | @ 20:00:00 | Stop: 2019-06-22 | NDC 00409128331

## 2019-06-22 MED ADMIN — SODIUM CHLORIDE 0.9 % IV BOLUS: 500 mL | INTRAVENOUS | @ 19:00:00 | Stop: 2019-06-22 | NDC 00338954304

## 2019-06-22 MED ADMIN — PIPERACILLIN-TAZOBACTAM 3.375 G/50 ML RTU: 3.375 g | INTRAVENOUS | @ 03:00:00 | Stop: 2019-06-22 | NDC 00206886102

## 2019-06-22 MED ADMIN — DEXTROSE 5 %/LACTATED RINGERS: 75 mL/h | INTRAVENOUS | @ 11:00:00 | Stop: 2019-06-26 | NDC 00338012504

## 2019-06-22 MED ADMIN — HYDROMORPHONE 10 MG/50 ML PCA SYRINGE (503B): 50 mL | INTRAVENOUS | @ 22:00:00 | Stop: 2019-06-25 | NDC 70092111450

## 2019-06-22 MED ADMIN — HYDROMORPHONE HCL 1 MG/ML IJ SOLN: .4 mg | INTRAVENOUS | @ 06:00:00 | Stop: 2019-06-22 | NDC 00409128331

## 2019-06-22 MED ADMIN — ACETAMINOPHEN 10 MG/ML IV SOLN: 1000 mg | INTRAVENOUS | @ 23:00:00 | Stop: 2019-06-23 | NDC 43825010201

## 2019-06-22 MED ADMIN — PIPERACILLIN-TAZOBACTAM 3.375 GM/50 ML RTU (EXT 4HR): 3.375 g | INTRAVENOUS | @ 15:00:00 | Stop: 2019-06-23 | NDC 00206886102

## 2019-06-22 MED ADMIN — PIPERACILLIN-TAZOBACTAM 3.375 GM/50 ML RTU (EXT 4HR): 3.375 g | INTRAVENOUS | @ 07:00:00 | Stop: 2019-06-23 | NDC 00206886102

## 2019-06-22 NOTE — Other
Patients Clinical Goal:   Clinical Goal(s) for the Shift: Monitor vss, comfort, safety, pain management   Identify possible barriers to advancing the care plan: None  Stability of the patient: Moderately Stable - low risk of patient condition declining or worsening   End of Shift Summary:   Pt is a 53 yo female w/hx of multiple prior surgeries d/t PUD, presenting with N/V and abdominal pain,    Pt is A/Ox4   Pt on 2L of O2, satting in the mid 90's    Pt is BMAT 2    Plan of Care Response and Outcomes (explanation of POC effectiveness or alterations made):   Continue to monitor JP drain output and NGT output    Continue abx for possible aspiration PNA    Encourage incentive spirometer    Pain management    Maintain IVF    Change in Status or Condition (concerning observations, events or assessments, and interventions):   N/A    Interdisciplinary Communication (team member communication):    Dr. Einar Crow regarding pain management     Psychosocial Communication (family of patient issues potentially affecting care):   N/A    Problem/Goal Progression (in relation to specific goals of care or hospitalization):   Laparoscopic hiatal repair and vagotomy done 06/21/19   JP drains x2 present, bloody output noted    4 lap sites noted   Foley in place   NGT in place w/LCS, 150 ml output, dark. CXR ordered to confirm placement    PCEA in place, pt c/o insufficient pain relief, Dr. Einar Crow notified, Dilaudid ordered and administered with some relief, endorsed to surgery that pain management is not sufficient     BOOST 8 P's Complete Or Pending BOOST #s  Refer to D.A.N. for specific details and progress.

## 2019-06-22 NOTE — Other
Patients Clinical Goal:   Clinical Goal(s) for the Shift: Safety, pain control, stable vital signs.  Identify possible barriers to advancing the care plan: none  Stability of the patient: Moderately Stable - low risk of patient condition declining or worsening   End of Shift Summary:      Received on bed at time of report, awake and alert x 4.   No signs of distress noted.   Side rails x 2 in place, call light within reach.   NSR on the cardiac monitor. 95% to 96% on 3 liters oxygen via nasal cannula.   PCEA in progress via pump.   NGT in place to low continuous wall suction, brown output noted in small amounts.   Kept NPO. Oral hygiene encouraged.   Assisted with her hygiene needs.   IV antibiotics given per order.   IV fluids continue at 75 ml/hour.   Seen by the Medical Resident, new orders noted.   Refused IV Tylenol, requesting an additional IV Dilaudid, MD notified.   Report given to [place] at 1100 H

## 2019-06-22 NOTE — Other
Patients Clinical Goal:   Clinical Goal(s) for the Shift: VSS, safety, prepare for Sx  Identify possible barriers to advancing the care plan: N/A  Stability of the patient: Moderately Unstable - medium risk of patient condition declining or worsening    End of Shift Summary:     During this shift:  0730 A&Ox4, calm and cooperative, able to make needs known. SpO2 >93% on RA. Abdomen soft and non distended. IVF running per order. Voiding. Skin intact. Last BM 6/30 . BMAT 3. Pt left to Sx via gurney in stable condition    1830 Received Pt from PACU in stable condition. SpO2 >93% on 2L per NC. Pt controlled with epidural, catheter c/d/i. PT remains NPO. Lt nare NGT to on LCWS. Rt and Lt abdomen JP drains in place, c/d/i.  Denies any N/V, or constipation. Received ABX Zosyn. Voiding Via F/C. Otherwise, no acute events during this shift, VSS, all needs met, will endorse to oncoming RN.   Pending Labs/Tests/Procedures/Treatments: Xray in AM    POC: Fall precautions, wound care, pain management, aspiration precautions,  DCP: D/C home pending clinical improvement. Pending workup.

## 2019-06-22 NOTE — Nursing Note
Patient endorse to Donna Gross, patient endore that patient in lcws via ngt, old blood dark red  noted as output, patient on PCEA , dressing dry,intact and clean, wpatient with 4 lap sited, dressing intact and clean, with bilateral jp left and right of the abdomen.Marland Kitchen

## 2019-06-22 NOTE — Progress Notes
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]  POSTOPERATIVE DAY #:1    Subjective:     Analgesic Therapy:    Catheter type: thoracic epidural catheter (T8-9)                                                                                                                                  Systemic analgesics:    Pain Meds (240h ago, onward)     Start     Ordered    06/22/19 1215  bupivacaine PF 0.1 %, fentaNYL 4 mcg/mL in sodium chloride 0.9% 250 mL epidural  (fentanyl-bupivacaine epidural)  Continuous     Question Answer Comment   If analgesia inadequate/pain >4/10, give clinical dose of ___ (mL) 4    And raise basal infusion by ___ mL/hr; MR x1 in 1 hour 1        06/22/19 1147    06/22/19 0815  acetaminophen IV inj 1,000 mg  Every 8 hours provider specified     Question Answer Comment   Is this patient currently on the Cystectomy ERAS protocol? No    Is the patient NPO? Yes    Does the patient have respiratory depression from opioids? No    Does the patient have postoperative ileus or opioid-induced ileus? Yes    Is the patient at least 54 years old? No    Does the patient have severe hepatic impairment or severe active liver disease? No        06/22/19 0747    06/21/19 2252  HYDROmorphone 1 mg/mL inj 0.4 mg  Every 2 hours PRN      06/21/19 2253    06/21/19 1609  HYDROmorphone 1 mg/mL inj 0.1 mg  (Hydromorphone IV)  Every 10 min PRN,   Status:  Discontinued      06/21/19 1609    06/21/19 1609  HYDROmorphone 1 mg/mL inj 0.2 mg  (Hydromorphone IV)  Every 10 min PRN,   Status:  Discontinued      06/21/19 1609    06/21/19 1609  HYDROmorphone 1 mg/mL inj 0.4 mg  (Hydromorphone IV) Every 10 min PRN,   Status:  Discontinued      06/21/19 1609    06/21/19 1522  bupivacaine-EPINEPHrine PF 0.5%-1:200000 inj  As needed for,   Status:  Discontinued      06/21/19 1522    06/21/19 0945  fentaNYL (PF) 4 mcg/mL, bupivacaine PF 0.1 % in sodium chloride 0.9% 250 mL epidural  (fentanyl-bupivacaine epidural)  Continuous,   Status:  Discontinued     Question Answer Comment   If  analgesia inadequate/pain >4/10, give clinical dose of ___ (mL) 4    And raise basal infusion by ___ mL/hr; MR x1 in 1 hour 1        06/21/19 0912    06/21/19 0945  fentaNYL 4 mcg/mL, bupivacaine PF 0.1 % in sodium chloride 0.9% 250 mL epidural  (fentanyl-bupivacaine epidural)  Continuous,   Status:  Discontinued     Question Answer Comment   If analgesia inadequate/pain >4/10, give clinical dose of ___ (mL) 4    And raise basal infusion by ___ mL/hr; MR x1 in 1 hour 1        06/21/19 0916    06/21/19 0830  HYDROmorphone 1 mg/mL inj 0.2 mg  (Hydromorphone Bolus (Bag or Injection))  Once      06/21/19 0800    06/21/19 0021  HYDROmorphone 1 mg/mL inj 0.2 mg  (Hydromorphone Bolus (Bag or Injection))  Every 2 hours PRN,   Status:  Discontinued      06/21/19 0022    06/21/19 0021  HYDROmorphone 1 mg/mL inj 0.4 mg  Every 2 hours PRN,   Status:  Discontinued      06/21/19 0022    06/20/19 1703  acetaminophen tab 500 mg  Every 4 hours PRN,   Status:  Discontinued      06/20/19 1703    06/20/19 1702  HYDROmorphone 1 mg/mL inj 0.2 mg  (Hydromorphone Bolus (Bag or Injection))  Every 2 hours PRN,   Status:  Discontinued      06/20/19 1702    06/20/19 1515  HYDROmorphone 1 mg/mL inj 1 mg  STAT      06/20/19 1504    06/20/19 1145  HYDROmorphone 1 mg/mL inj 1 mg  STAT      06/20/19 1140    Signed and Held  acetaminophen IV inj 1,000 mg  Once     Question Answer Comment   Is this patient currently on the Cystectomy ERAS protocol? No    Is the patient NPO? Yes    Does the patient have respiratory depression from opioids? Yes Does the patient have postoperative ileus or opioid-induced ileus? Yes    Is the patient at least 54 years old? No    Does the patient have severe hepatic impairment or severe active liver disease? Yes        Signed and Held                                                                                           Anticoagulants: none    Recent events: Yes Patient having pain overnight, started on dilaudid 0.4mg  IVP q 2 h    History of Present Illness:    Pain    Pain location: Abdomen and Back pain    Pain level at Rest: 10       Pain level with Activity: 10    Description: Sharp     Worse with: Movement     Review of Systems:     Itching: 0   Nausea: 0  (Itching and Nausea: 0=none, 1=present, not treated;2=relieved by med;3=treated, not relieved)    Urination:  Foley in        Ability to Sleep: Poor     Ambulation: Not attempting      Diet:  NPO     Evidence of orthostasis: No    Flatus: No    Bowel Movement: No    Objective:     Physical Exam:    BP 102/71 (Patient Position: Lying)  ~ Pulse 85  ~ Temp 37 ???C (98.6 ???F) (Temporal)  ~ Resp 16  ~ SpO2 97%     I/O last 3 completed shifts:  In: 2185 [P.O.:240; I.V.:1945]  Out: 1510 [Urine:1245; Emesis/GI output:150; Drains:115]  No intake/output data recorded.    Catheter site:  Clean, Dry and Intact    Catheter site depth at skin: 13 cm    Catheter site tender to palpation: No    Neuro/MSK:       Sensory block present to cold/sharp on bilaterally T10 -T3 level    Gait and station: Unable to examine    Lower extremity strength:  Normal     Sedation: 0  (0 = alert, 1 = drowsy, eyes open, 2 = drowsy, eyes closed, arousable, 3 = unarousable    Mood/Affect:  Agitated from pain    Laboratory Data:      Lab Results   Component Value Date    WBC 13.32 (H) 06/22/2019    HGB 10.3 (L) 06/22/2019    HCT 33.4 (L) 06/22/2019    MCV 92.5 06/22/2019    PLT 364 06/22/2019       Assessment:     Davielle Lingelbach is a 53 y.o. female. The patient  has a past medical history of Anxiety, Depression, Emphysema, unspecified (HCC/RAF), Fibromyalgia, Gastritis, GERD (gastroesophageal reflux disease), and Pancreatitis..     S/p Procedure(s):  LAPAROSCOPY ; VAGOTOMY AND ANTRECTOMY, GASTROJEJUNOSTOMY AND HIATAL HERNIA REPAIR     Patient reports poor pain control, epidural pump noted to be stopped and alarming due to empty bag, patient reports she had notified her nurse of this around 9 am. We asked new nurse to bring new bag from the pharmacy. Bolus of lidocaine 1% with epi 6 mL in divided doses given. Patient reported much improved pain control after lidocaine with good level. BP dropped to 70's/40's with infusion so will give 500 mL fluid bolus as ordered by surgery team.    Plan/ Recommendation:   > Epidural infusion increased to 8 mL/hr.  > Continue thoracic epidural catheter until POD 4-5 or until patient taking PO meds.  > recommend increasing dilaudid for breakthrough to every 1 hr.  > Encourage PO/IV supplemental analgesia   > Encourage OOB and ambulation  > All questions answered  > Further management per primary  > Thank you for allowing [place] to participate in the care of this patient. Please call pager [phone] / phone (7AM- 3PM) [phone] for any questions/concerns.      Author:  Tye Savoy. Leavy Cella, MD 06/22/2019 11:48 AM

## 2019-06-22 NOTE — Progress Notes
INPATIENT H&P / GENERAL SURGERY / [facility] [phone]    PATIENT:  Donna Gross  MRN:  [id number]  DOB:  July 11, 1966  DATE OF SERVICE:  06/22/2019    GENERAL SURGERY ATTENDING PHYSICIAN:  Dr. [name]  PRIMARY CARE PHYSICIAN:  [facility] MD    HPI:  Donna Gross is a 53 y.o. female with complex peptic ulcer history who presented to ED with 3 days of N/V and abdominal pain on 06/20/19. She was admitted the night before scheduled surgery. She is now POD1 s/p scheduled laparoscopic vagotomy and antrectomy, gastrojejunostomy, and hiatal hernia repair (06/21/19).     Interval Events/Subjective:    06/21/19: Pt with nausea but no emesis overnight.  Has c/o of constant abdominal pain. Pt has been NPO for surgery.  06/22/19: NGT left in place after surgery. Patient enduring moderate post-operative pain. Was evaluated without evidence of acute pathology, bleed, or infection. Started on Dilaudid prn for post-operative pain (has chronic pain and moderate opioid requirements at home) in addition to existing PCEA. Patient continues to have pain this morning that is under improved control. Denies nausea or vomiting.    Temp:  [36.6 ???C (97.8 ???F)-37.7 ???C (99.8 ???F)] 37 ???C (98.6 ???F)  Heart Rate:  [71-99] 85  Resp:  [14-25] 16  BP: (94-151)/(62-96) 102/71  NBP Mean:  [73-110] 82  SpO2:  [93 %-99 %] 97 %  PEEP (setting):  [5 cmH2O-10 cmH2O] 5 cmH2O    I/O last 3 completed shifts:  In: 2185 [P.O.:240; I.V.:1945]  Out: 1510 [Urine:1245; Emesis/GI output:150; Drains:115]    Diets/Supplements/Feeds   Diet    Diet NPO     Start Date/Time: 06/21/19 0000      Number of Occurrences: Until Specified       Scheduled Meds:  ??? acetaminophen  1,000 mg Intravenous Q8H   ??? pantoprazole  40 mg IV Push Q24H   ??? piperacillin/tazobactam  3.375 g Intravenous Q8H   ??? QUEtiapine  100 mg Oral BID       PRN Meds:  albuterol, HYDROmorphone, naloxone, ondansetron    Exam:  Current BP 102/71 (Patient Position: Lying)  ~ Pulse 85  ~ Temp 37 ???C (98.6 ???F) (Temporal)  ~ Resp 16  ~ SpO2 97%   Gen: NAD  HEENT: NCAT, EOMI, MMM, neck supple. NGT in place, secured, with bilious output.   CV: RRR, nl s1 s2  Pulm: Normal respiratory excursions, not tachypneic, no labored breathing.   Abd: Soft, nondistended. Multiple prior incisions, feeding tube sites, drain sites. Has mild expected abdominal tenderness especially around incision sites. Incisions clean/dry/intact with dressings in place. Has two abdominal JP drains with scant serosanguineous output.  GU: Foley catheter in place draining clear yellow urine.  Ext: warm, well perfused, no cyanosis, clubbing, or edema  Neuro: A&Ox3  Drains: abdominal JPx2. Both with scant serosanguineous output.  Output by Drain (mL) 06/20/19 0701 - 06/20/19 1900 06/20/19 1901 - 06/21/19 0700 06/21/19 0701 - 06/21/19 1900 06/21/19 1901 - 06/22/19 0700 06/22/19 0701 - 06/22/19 0934   Surgical Drain 1 Right Abdomen JP   40 25    Surgical Drain 2 Left Abdomen JP   10 40         Labs:  Recent Labs     06/20/19  1149   HGB 12.4   HCT 38.7   PLT 478*     Recent Labs     06/20/19  1150   PT 13.0   INR 1.0  Recent Labs     06/21/19  0441 06/20/19  1149   CREAT 1.10 1.19   BUN 13 15   NA 138 137   K 4.0 4.5   CL 101 101   CO2 26 22   GLUCOSE 97 97   CALCIUM 9.2 10.0     Recent Labs     06/21/19  0441   MG 1.9   PHOS 3.2     Recent Labs     06/21/19  0441 06/20/19  1149   GLUCOSE 97 97       Recent Labs     06/20/19  1149   TOTPRO 7.7   ALBUMIN 4.4     No results for input(s): PREALBUMIN in the last 72 hours.   Recent Labs     06/20/19  1149   BILITOT <0.2   BILICON <0.2   AST 23   ALT 13   ALKPHOS 95     Recent Labs     06/20/19  1150   INR 1.0     Recent Labs     06/20/19  1149   AMYLASE 75   LIPASE 16       Micro:   Recent Labs     06/20/19  1149   WBC 8.34       Recent Results (from the past 336 hour(s))   COVID-19 PCR, Nasopharyngeal    Collection Time: 06/19/19  3:47 PM   Result Value Ref Range    Specimen Type Respiratory, Upper COVID-19 PCR Not Detected Not Detected     Imaging:  Xr Kub Portable (1 View)    Result Date: 06/20/2019  IMPRESSION: Moderate stool burden. Normal bowel gas pattern. No intraperitoneal free air. No acute osseous abnormality. I, Quentin Cornwall, M.D., have reviewed this radiological study personally and I am in full agreement with the findings of the report presented here. Dictated by: Carolina Cellar   06/20/2019 4:12 PM Signed by: Quentin Cornwall   06/20/2019 4:31 PM    Xr Chest For Feeding Tube Placement (1 View)    Result Date: 06/22/2019  IMPRESSION: Epidural catheter is present. An enteric tube coursing below diaphragm with the tip projecting over mid gastric body. Upper abdominal surgical drains. Allowing for low lung volume the cardiomediastinal silhouette is stable. Mild interstitial pulmonary edema and vascular congestion. Left chest and abdominal wall moderate subcutaneous emphysema. Signed by: Benjamine Sprague   06/22/2019 9:12 AM    Assessment/Plan:  Donna Gross is a 53 y.o. female with multiple prior surgeries that stemmed from recurrent peptic ulcer disease for which she now presents. She is now 1 Day Post-Op s/p scheduled laparoscopic vagotomy and antrectomy, gastrojejunostomy, and hiatal hernia repair (06/21/19).      #Neuro  - Pain Management: IV Tylenol 1000mg  q8h, Fentanyl PCEA, Dilaudid 0.4mg  q2h prn  - Acute Pain consulted, appreciate recs  - Activity as tolerated    #Pulmonary   - Pulm Toileting: Incentive spirometer     #GI  - Bowel regimen: on hold while NPO  - Ulcer ppx: Protonix 40mg  IV QDay  - Nutrition orders: Diet NPO, maintain IVF  - F/u AM BMP     #GU:   - Foley catheter in place    #ID/ Heme: Pt had large aspiration event during induction of anesthesia prior to surgery. She is being treated with Zosyn empirically to prevent pneumonitis/pneumonia.  - f/u AM CBC  - ABX: Zosyn IV    #PPx   - SCDs    #  Disposition  - D/C home when tolerating regular diet and pain is controlled on PO pain regimen. - No home health needs anticipated.  - Please page [phone] (surgery, [place] team) with any questions.  - Code Status: Prior    Patient seen with General Surgery Team and plan of care discussed with Attending, Alonna Buckler., MD, who is in agreement.     Author:    Remi Deter, MD  9:34 AM 06/22/2019  Division of General Surgery  [facility]

## 2019-06-22 NOTE — Op Note
DATE OF OPERATION:  06/21/2019      PREOPERATIVE DIAGNOSIS:  1. Refractory duodenal ulcer.  2. Status post prior exploratory laparotomy and Graham patch.  3. Status post prior laparotomy for colonic fistula.  4. Status post diverting ileostomy.  5. Status post prior abdominal wall component separation.  6. Hiatal hernia.  7. Chronic abdominal pain.    POSTOPERATIVE DIAGNOSIS:  1. Refractory duodenal ulcer.  2. Status post prior exploratory laparotomy and Graham patch.  3. Status post prior laparotomy for colonic fistula.  4. Status post diverting ileostomy.  5. Status post prior abdominal wall component separation.  6. Hiatal hernia.  7. Chronic abdominal pain.  8. Posterior perforated duodenal ulcer with perforation into the head of the pancreas.  9. Extensive adhesions of the entirety of the visceral block to the anterior abdominal wall.    NAME OF OPERATION:  1. Exploratory laparoscopy.  2. Extensive adhesiolysis of the entirety of the visceral block to the anterior abdominal wall including the entirety of the small bowel, stomach, colon, liver, gallbladder with intra-loop adhesions.  3. Atonic, dilated, obstructed stomach with gastric outlet obstruction from a posterior perforated duodenal ulcer.  4. Laparoscopic complete Kocherization of the duodenum and laparoscopic mobilization of the right hepatic flexure.      SURGEON:  Dineen Kid. [name], MD [phone])      COSURGEON:  Dr. Lenis Dickinson.    ASSISTANT:  Dr. Emeline General    INDICATIONS:  Donna Gross is a very pleasant 53 year old woman. She is well known to the General Surgery Service at [facility]. She has had a very complex abdominal operative history with multiple laparotomies, perforated ulcer, longstanding chronic ulcer disease, prior complications related to this, including a transverse colonic fistula, subsequent transverse colectomy and fistula takedown, prior diverting ostomy, and a priorclosure of the abdominal wall with anterior component separation, presenting with refractory peptic ulcer disease. This has been very symptomatic with chronic abdominal pain. It is uncertain whether this is due to the ulcer disease or due to the multiple prior operations and hostile abdomen with extensive adhesions, but endoscopy has demonstrated nonresolution of a significant duodenal ulcer. She has had p.o. intolerance and has been admitted several times for pain and p.o. intolerance. We had scheduled her for surgery with the understanding that addressing her ulcer would be high risk, but given her quality of life, inability to eat and maintain her weight, and extensive pain, it was reasonable to perform a definitive acid-reducing operation. We discussed performing a vagotomy antrectomy. She had a likely component of a hiatal hernia and we discussed repairing this at the same time. We discussed the risks associated with this, including the extensive risk of damage to adjacent viscera, especially given that she had a prior colonic fistula, multiple prior operations, adherence to the portal structures, a prior Graham patch and closure of an anterior perforated duodenal ulcer, and otherwise a hostile abdomen; however, she was in need of definitive management of this ulcer disease. We discussed performing a vagotomy and antrectomy, likely repair her hiatal hernia at the same time. We discussed the possibility of anastomotic leakage, bile reflux, dumping syndrome, and ongoing abdominal pain. She was admitted the day before surgery with a gastric outlet obstruction. She had vomiting, nausea and p.o. intolerance, as well as pain. On induction of anesthesia, despite being n.p.o., she still vomited with rapid sequence induction of 1 L of bilious fluid with clear evidence of stasis. After this, we were able to suction an additional liter of  fluid with a nasogastric tube, consistent with her gastric outlet obstruction. She understood all of the risks associated with this operation and consented for surgery.    OPERATIVE FINDINGS:  The patient had extensive adhesions of the entirety of the visceral block to the abdominal wall. There were extensive adhesions of the stomach, duodenum, colon, transverse colon, small bowel to the entirety of the undersurface of the abdominal wall, and then intra-loop adhesions to themselves. We, however, were able to lyse these adhesions in the entirety of the visceral block laparoscopically. The duodenum was clearly narrowed with evidence of firm scarring in this area. The perforation, however, was posterior and perforated into the head of the pancreas with a chronic ulcer. The back wall of the ulcer was the head of the pancreas. The stomach was also found to be very boggy, over double the size with clear evidence of atony of the distal body, antrum, and pylorus. We performed a subtotal gastrectomy, resection of the first portion of the duodenum all the way to the transition to the second portion of the duodenum. We then performed a Billroth II gastrojejunostomy and hiatal hernia repair at the time of the truncal vagotomy.    DESCRIPTION OF PROCEDURE:  After informed consent was obtained, the patient was taken to the operating room and placed supine on the operating room table. Rapid sequence induction was performed, but even with these measures, the patient still vomited over a liter of chronic bilious output. She was rapidly intubated and her airway suctioned through the endotracheal tube with minimal overt aspiration. Anesthesia then placed a nasogastric tube and decompressed an additional liter. We then prepped and draped this patient in standard fashion. A dose of cefoxitin was administered prior to the incision. A Foley catheter was placed for bladder decompression. We began with a 5 mm incision in the left upper abdomen through the rectus complex immediately subcostal. We immediately encountered adhesions, however, these were quite filmy in the left upper quadrant and we were able to drop this down laterally toward the sidewall. We could visualize the small bowel loops adherent to the peritoneum. These were taken down with filmy adhesions to the left upper quadrant lateral wall, allowing [place] to place an additional 5 mm trocar. Then, using the LigaSure and a combination of LigaSure and sharp dissection, we then meticulously lysed the undersurface of the abdominal wall. The entirety of the visceral block was stuck to the anterior abdominal wall, but millimeter by millimeter, we were able to safely drop down the small bowel and transverse colon from the undersurface of the anterior abdominal wall. We placed additional trocars in the right upper subcostal quadrant and in the left upper subcostal position, allowing [place] to then dissect in the caudad direction. We cleared off the undersurface of the abdominal wall toward the midline. This became quite tenacious in the right lower quadrant. There were extensive adhesions to the prior ostomy site. We, however, were able to completely free this, releasing the undersurface of the wall. We placed an additional port to the right of the umbilicus and then placed an additional port in the left lateral abdominal wall. We continued our dissection all the way down to the pelvis, dropping down adhesions from the loops of small bowel to the anterior abdominal wall. On the right side, the right colon and appendix were released from the peritoneum, and on the left side, the sigmoid colon was dropped down. There were multiple interloop adhesions between these loops and eventually these were  all freed in their entirety to facilitate the small bowel reconstruction. We then turned our attention to the hiatus. The stomach was visualized and was very enlarged. The proximal stomach was relatively normal-appearing. The mid to distal stomach, however, was very dilated and boggy. The pylorus and duodenum were identified and were quite firm with evidence of a gastric outlet obstruction. The nasogastric tube had decompressed the stomach and this revealed that this distal stomach was boggy from chronic obstruction. The area of the prior duodenal perforation had been patched with omentum. We then took down the lesser curvature, opening up the pars flaccida. This was continued up to the right crus of the diaphragm. The common hepatic artery was visualized and then the hepatic artery proper and porta. The duodenum was adhesed to the porta with multiple dense bands, likely from the prior [place] patch to the porta. The gallbladder was very enlarged, but decompressed. This was densely fused to the duodenum, also likely from the prior perforation. We meticulously dissected the right crus of the diaphragm. The proximal stomach was otherwise relatively normal in appearance. On the greater curvature, we took down the short gastrics. The spleen was plastered to the lateral abdominal wall. The entirety of the splenic artery including its upper arcade vessels were preserved. The short gastrics were taken, allowing [place] to expose the left crus. The peritoneum overlying the left and right crus were opened, exposing the hiatus and allowing [place] to enter into the hiatus. Hernia sac was present and this was reduced down. We then identified the anterior vagus nerve. We isolated this for approximately 2 cm, well separating this from the esophagus and then this was divided and sent to Pathology. The posterior vagus was identified adherent to the posterior wall of the esophagus along the left crus. This was isolated. Any potential branches including a potential criminal nerve of Gretta Arab were interrogated, dissected well up into the hiatus. There was a branch coming off this posterior vagus and both of these were divided with the LigaSure for a segment of 2 cm and sent to Pathology for confirmation. Once this was completed, the esophagus was completely circumferentially mobilized and released from the pleura and pericardium and aorta. We then ensured adequate mobilization of the esophagus and then closed the hiatus with 0 Ethibond pledgeted sutures with 2 sequential sutures to reapproximate the hiatus. Once this was completed, we then dissected out the celiac   axis. The celiac axis was able to be skeletonized completely. The posterior wall of the stomach was dissected off the pancreas. The greater curvature had been dissected free. The left gastric artery was carefully preserved. The right gastric artery was divided in the lesser curvature mesenteric supply. We then upsized the left lateral trocar to a 12 mm port. An Endo-GIA purple load stapler was used to transect the stomach at the angularis at a point that the stomach was clearly still vascularized and viable without the atonic distention noted in the distal body and antrum. We then dissected this distally toward the pylorus. Around the pylorus this became densely scarred from the prior [facility] patch and was also fused to the head of the pancreas. We dissected directly along the head of the pancreas and off the porta itself on the lateral aspect. This required dissecting the gallbladder free from the duodenum. This was densely fused. We then dissected laterally Kocherizing the duodenum. The right hepatic flexure was also dissected. We separated the duodenum from the right hepatic flexure. The hepatic flexure and transverse  colon were dissected away from the pylorus and greater curvature of the stomach. This was fused and we encountered scar from the prior transverse colectomy. The C curve of the duodenum was visualized. The second portion of the duodenum and third portion of the duodenum were tethered into the first portion of the duodenum, which was very firm and scarred with overlying scarring. In order to safely identify this, we dissected from the fourth portion of the duodenum to the ligament of Treitz. We then skeletonized the superior mesenteric artery and vein, passing over the third portion of the duodenum. The third portion and second portion of the duodenum were then identified and there was a conglomerate mass of the first portion of the duodenum with the head of the pancreas. We then separated this from the gallbladder completely, dissecting the gallbladder neck and cystic duct from the scarred pylorus. The prior omental patch confounded some of this anatomy. We then used the argon beam coagulator to help dissect through some of this scar. On the cephalad aspect, we were able to separate this from the gallbladder and porta on the inferior aspect. This was densely fused to the head of the pancreas. We therefore lifted up the antrum and we could visualize the hepatic artery proper and then the course of the GDA. We dissected in the interface between the head of the pancreas and the posterior wall of the first portion of the duodenum, just above the plane of the PDA. We used the argon beam coagulator to help separate this, erring on the side of the duodenal side to not injure the head of the pancreas or the common bile duct. We then lifted this off and entered into the posterior wall where the first portion of the duodenum had a very large ulcer measuring at least 2 cm. The posterior wall of the ulcer was the head of the pancreas itself. We were able to traverse through this, dissecting from distal and proximal until we lifted this off the head of the pancreas itself. There was a 2 cm hole in the posterior wall of the duodenum with extensive scarring with the ulcer bed being the head of the pancreas itself. Once this was freed and elevated the 1st portion of the duodenum, we then used the Endo-GIA purple load 45 mm stapler to transect the duodenum. There was a separate small duodenal defect in the posterior lip of the first to second portion of the duodenum from the area of adherence to the pancreatic head. This was freed to obtain an adequate posterior wall and then a 3-0 PDS V-Loc suture was used to suture the duodenal stump closed in running fashion. We then used an additional 3-0 PDS V-Loc suture to Lembert the entirety of the duodenal stump suture line with viable edges and an adequate closure away from the ulcer bed. The antrectomy specimen was then moved out of the field. We then irrigated the area. The head of the pancreas was preserved. The plane of the common bile duct was below. The portal structures were then examined and the cystic duct and neck of the gallbladder were all able to be well delineated. We then turned to the ligament of Treitz. We mobilized the small bowel all the way distally down into the pelvis, completely mobilizing over 100 cm of proximal small bowel. The remaining distal small bowel had been overtly freed, but only the portions that were adherent to the anterior abdominal wall to minimize overt risk of enterotomy. We then  brought the jejunum up to the stomach in an isoperistaltic fashion. This was approximated with 2-0 Vicryl suture. A gastrotomy was made and an enterotomy was made, and an Endo-GIA purple load stapler was used to create a side-to-side stapled gastrojejunostomy anastomosis on the posterior wall of the stomach. We then used 3-0 Vicryl sutures to close the common channel and elevate this up. An additional fire of the Endo-GIA purple load stapler was used to close   this common enterotomy. 3-0 PDS V-Loc suture was used to Lembert the staple line. Once this was completed, we then occluded the afferent and efferent limbs and performed a leak test using methylene blue instilled through the nasogastric tube. There was no evidence of any leak with insufflation and distention of the stomach to 200 cc. There was no evidence of any leakage. Once this was completed, we had upsized the right lateral abdominal port to a 12 mm port to facilitate stapling of the common channel. We then passed a 19 round Jackson-Pratt drain from this port site and placed this against the duodenal stump and over the port and underneath the left lobe of the liver. We passed another 19 round Jackson-Pratt drain from the left lateral port site, placed behind the gastrojejunostomy. Once this was completed, we then made an incision using the 5 mm transrectus left-sided port site. We carried this down through the subcutaneous tissue. The anterior rectus sheath was identified. The rectus muscle split. The posterior sheath was opened. Alexis wound protector was placed in the stomach remnant. The stomach antrum and pylorus and duodenum were extracted. There was a clear ulcer in the posterior wall of the duodenum with this area of perforation. We then sent this to Pathology. We irrigated the abdomen. The posterior rectus sheath was closed with 2-0 PDS suture. The anterior rectus sheath was separately closed with 2-0 PDS suture. Skin was closed with 4-0 Monocryl suture. We then returned laparoscopically. The undersurface of this extraction site was completely closed. Abdominal wall was entirely freed throughout. Both Jackson-Pratt drains were in excellent position. There was excellent hemostasis. We irrigated. We suctioned out all irrigation. We then removed all ports under direct visualization. Skin incisions were closed with 4-0 Monocryl suture in interrupted subcuticular fashion. The sponge, needle, and instrument counts were all correct at the end the case. The patient was awakened from anesthesia, taken to recovery room in stable condition.    COMPLICATIONS:  None.    ESTIMATED BLOOD LOSS:  Minimal.    SPECIMENS:  1. Anterior vagus.  2. Posterior vagus. 3. Antrum and perforated posterior duodenum with chronic ulcer.     Please include a complexity modifier 22. This operation required significantly more operative time and complexity, requiring 6 hours of operative time with 4 hours devoted to the complexity with extensive adhesions throughout the entirety of the abdomen, the multiple prior surgeries, the scarring to the head of the pancreas and porta, all requiring significant operative time and complexity. Dr. Charlane Ferretti served as a co-surgeon for this case given the absence of a qualified resident for this operation.      Dineen Kid [name], MD [phone])        DCC/MODL CONF#: [id number]  D: 06/22/2019 07:56:07 T: 06/22/2019 09:00:27 DOCUMENT: [id number]

## 2019-06-23 LAB — Basic Metabolic Panel
GFR ESTIMATE FOR AFRICAN AMERICAN: 69 mL/min/{1.73_m2} (ref 96–106)
GLUCOSE: 92 mg/dL (ref 65–99)

## 2019-06-23 LAB — Differential Automated: ABSOLUTE IMMATURE GRAN COUNT: 0.14 10*3/uL — ABNORMAL HIGH (ref 0.00–0.04)

## 2019-06-23 LAB — CBC: RED BLOOD CELL COUNT: 2.89 x10E6/uL — ABNORMAL LOW (ref 3.96–5.09)

## 2019-06-23 LAB — Magnesium: MAGNESIUM: 1.8 meq/L (ref 1.4–1.9)

## 2019-06-23 LAB — Phosphorus: PHOSPHORUS: 2.4 mg/dL (ref 2.3–4.4)

## 2019-06-23 MED ADMIN — QUETIAPINE FUMARATE 100 MG PO TABS: 100 mg | ORAL | @ 03:00:00 | Stop: 2019-07-03

## 2019-06-23 MED ADMIN — ACETAMINOPHEN 10 MG/ML IV SOLN: 1000 mg | INTRAVENOUS | @ 21:00:00 | Stop: 2019-06-24 | NDC 43825010201

## 2019-06-23 MED ADMIN — PIPERACILLIN-TAZOBACTAM 3.375 GM/50 ML RTU (EXT 4HR): 3.375 g | INTRAVENOUS | @ 16:00:00 | Stop: 2019-06-23 | NDC 00206886102

## 2019-06-23 MED ADMIN — QUETIAPINE FUMARATE 100 MG PO TABS: 100 mg | ORAL | @ 15:00:00 | Stop: 2019-07-03

## 2019-06-23 MED ADMIN — ACETAMINOPHEN 10 MG/ML IV SOLN: 1000 mg | INTRAVENOUS | @ 07:00:00 | Stop: 2019-06-23

## 2019-06-23 MED ADMIN — PANTOPRAZOLE SODIUM 40 MG IV SOLR: 40 mg | INTRAVENOUS | @ 04:00:00 | Stop: 2019-06-28

## 2019-06-23 MED ADMIN — PCEA FENTANYL-BUPIVACAINE: 250 mL | EPIDURAL | @ 13:00:00 | Stop: 2019-06-25 | NDC 63323047237

## 2019-06-23 MED ADMIN — DEXTROSE 5 %/LACTATED RINGERS: 75 mL/h | INTRAVENOUS | @ 03:00:00 | Stop: 2019-06-26 | NDC 00338012504

## 2019-06-23 MED ADMIN — PIPERACILLIN-TAZOBACTAM 3.375 GM/50 ML RTU (EXT 4HR): 3.375 g | INTRAVENOUS | @ 08:00:00 | Stop: 2019-06-23 | NDC 00206886102

## 2019-06-23 MED ADMIN — PIPERACILLIN-TAZOBACTAM 3.375 GM/50 ML RTU (EXT 4HR): 3.375 g | INTRAVENOUS | Stop: 2019-06-23 | NDC 00206886102

## 2019-06-23 MED ADMIN — HYDROMORPHONE 10 MG/50 ML PCA SYRINGE (503B): 50 mL | INTRAVENOUS | @ 13:00:00 | Stop: 2019-06-25 | NDC 70092111450

## 2019-06-23 MED ADMIN — DEXTROSE 5 %/LACTATED RINGERS: 75 mL/h | INTRAVENOUS | @ 16:00:00 | Stop: 2019-06-26 | NDC 00338012504

## 2019-06-23 MED ADMIN — ACETAMINOPHEN 10 MG/ML IV SOLN: 1000 mg | INTRAVENOUS | @ 15:00:00 | Stop: 2019-06-24 | NDC 43825010201

## 2019-06-23 MED ADMIN — DIPHENHYDRAMINE HCL 50 MG/ML IJ SOLN: 12.5 mg | INTRAVENOUS | @ 23:00:00 | Stop: 2019-06-23 | NDC 63323066401

## 2019-06-23 NOTE — Progress Notes
INPATIENT H&P / GENERAL SURGERY / [facility] [phone]    PATIENT:  Donna Gross  MRN:  [id number]  DOB:  1966/07/11  DATE OF SERVICE:  06/23/2019    GENERAL SURGERY ATTENDING PHYSICIAN:  Dr. [name]  PRIMARY CARE PHYSICIAN:  [facility], MD    HPI:  Donna Gross is a 53 y.o. female with complex peptic ulcer history who presented to ED with 3 days of N/V and abdominal pain on 06/20/19. She was admitted the night before scheduled surgery. She is now POD1 s/p scheduled laparoscopic vagotomy and antrectomy, gastrojejunostomy, and hiatal hernia repair (06/21/19).     Interval Events/Subjective:    06/21/19: Pt with nausea but no emesis overnight.  Has c/o of constant abdominal pain. Pt has been NPO for surgery.  06/22/19: NGT left in place after surgery. Patient enduring moderate post-operative pain. Was evaluated without evidence of acute pathology, bleed, or infection. Started on Dilaudid prn for post-operative pain (has chronic pain and moderate opioid requirements at home) in addition to existing PCEA. Patient continues to have pain this morning that is under improved control. Denies nausea or vomiting.  06/23/19: Required 500cc NS bolus x 2 yesterday for rising Cr and SBPs to low 100s. Also started IV tylenol and Dilaudid PCA for pain control. Pt reports pain well controlled this morning. Denies N/V, fevers, or chills. Requiring nasal cannula to maintain O2 sats>92%, but pt denies SOB, respiratory distress, or chest pain.    Temp:  [36.8 ?C (98.3 ?F)-37.7 ?C (99.9 ?F)] 37.1 ?C (98.8 ?F)  Heart Rate:  [78-117] 95  Resp:  [15-18] 18  BP: (93-127)/(62-89) 107/73  NBP Mean:  [73-96] 84  SpO2:  [90 %-99 %] 93 %       I/O last 3 completed shifts:  In: 650 [I.V.:50; IV Piggyback:600]  Out: 2595 [Urine:1800; Emesis/GI output:630; Drains:165]    Diets/Supplements/Feeds   Diet    Diet NPO     Start Date/Time: 06/21/19 0000      Number of Occurrences: Until Specified       Scheduled Meds: ? acetaminophen  1,000 mg Intravenous Q8H   ? pantoprazole  40 mg IV Push Q24H   ? piperacillin/tazobactam  3.375 g Intravenous Q8H   ? QUEtiapine  100 mg Oral BID       PRN Meds:  albuterol, HYDROmorphone **AND** sodium chloride **AND** naloxone, ondansetron    Exam:  Current BP 107/73 (Patient Position: Sitting)  ~ Pulse 95  ~ Temp 37.1 ?C (98.8 ?F) (Oral)  ~ Resp 18  ~ SpO2 93%   Gen: NAD  HEENT: NCAT, EOMI, MMM, neck supple. NGT in place, secured, to LCWS with bilious output.   CV: RRR, nl s1 s2  Pulm: Normal respiratory excursions, not tachypneic, no labored breathing.   Abd: Soft, nondistended. Multiple prior incisions, feeding tube sites, drain sites. Has mild expected incisional tenderness, no abdominal tenderness. Incisions clean/dry/intact with dressings in place. Has two abdominal JP drains with scant serosanguineous output.  GU: Foley catheter in place draining clear yellow urine.  Ext: warm, well perfused, no cyanosis, clubbing, or edema  Neuro: A&Ox3  Drains: abdominal JPx2. Both with scant serosanguineous output.  Output by Drain (mL) 06/21/19 0701 - 06/21/19 1900 06/21/19 1901 - 06/22/19 0700 06/22/19 0701 - 06/22/19 1900 06/22/19 1901 - 06/23/19 0700 06/23/19 0701 - 06/23/19 1154   Surgical Drain 1 Right Abdomen JP 40 25 30 25     Surgical Drain 2 Left Abdomen JP 10 40 25  20         Labs:  Recent Labs     06/23/19  0603 06/22/19  0913   HGB 8.3* 10.3*   HCT 26.7* 33.4*   PLT 322 364     No results for input(s): APTT, PT, INR in the last 72 hours.    Recent Labs     06/23/19  0603 06/22/19  0913 06/21/19  0441   CREAT 1.06 1.54* 1.10   BUN 14 21 13    NA 137 138 138   K 4.6 4.6 4.0   CL 104 102 101   CO2 20 23 26    GLUCOSE 92 93 97   CALCIUM 8.4* 8.2* 9.2     Recent Labs     06/23/19  0603 06/22/19  0913 06/21/19  0441   MG 1.8 2.3* 1.9   PHOS 2.4 3.6 3.2     Recent Labs     06/23/19  0603 06/22/19  0913 06/21/19  0441   GLUCOSE 92 93 97 No results for input(s): TOTPRO, ALBUMIN in the last 72 hours.  No results for input(s): PREALBUMIN in the last 72 hours.   No results for input(s): BILITOT, BILICON, AST, ALT, ALKPHOS, LDH in the last 72 hours.  No results for input(s): INR in the last 72 hours.  No results for input(s): AMYLASE, LIPASE in the last 72 hours.    Micro:   Recent Labs     06/23/19  0603 06/22/19  0913   WBC 14.21* 13.32*       Recent Results (from the past 336 hour(s))   COVID-19 PCR, Nasopharyngeal    Collection Time: 06/19/19  3:47 PM   Result Value Ref Range    Specimen Type Respiratory, Upper     COVID-19 PCR Not Detected Not Detected     Imaging:  Xr Kub Portable (1 View)    Result Date: 06/20/2019  IMPRESSION: Moderate stool burden. Normal bowel gas pattern. No intraperitoneal free air. No acute osseous abnormality. I, Quentin Cornwall, M.D., have reviewed this radiological study personally and I am in full agreement with the findings of the report presented here. Dictated by: Carolina Cellar   06/20/2019 4:12 PM Signed by: Quentin Cornwall   06/20/2019 4:31 PM    Xr Chest For Feeding Tube Placement (1 View)    Result Date: 06/22/2019  IMPRESSION: Epidural catheter is present. An enteric tube coursing below diaphragm with the tip projecting over mid gastric body. Upper abdominal surgical drains. Allowing for low lung volume the cardiomediastinal silhouette is stable. Mild interstitial pulmonary edema and vascular congestion. Left chest and abdominal wall moderate subcutaneous emphysema. Signed by: Benjamine Sprague   06/22/2019 9:12 AM    Assessment/Plan:  Donna Gross is a 53 y.o. female with multiple prior surgeries that stemmed from recurrent peptic ulcer disease for which she now presents. She is now 2 Days Post-Op s/p scheduled laparoscopic vagotomy and antrectomy, gastrojejunostomy, and hiatal hernia repair (06/21/19) c/b aspiration during induction of anesthesia for which patient is on empiric IV abx. Mild AKI noted yesterday now resolved. Patient is otherwise recovering well without acute issues.     #Neuro  - Pain Management: IV Tylenol 1000mg  q8h, Fentanyl PCEA, Dilaudid PCA  - Acute Pain consulted, appreciate recs  - Activity as tolerated    #Pulmonary   - Pulm Toileting: Incentive spirometer     #GI  - Continue NGT to LCWS  - Bowel regimen: on hold while NPO  - Ulcer ppx: Protonix  40mg  IV QDay  - Nutrition orders: Diet NPO, maintain IVF  - Daily BMP     #GU:   - AKI resolved in response to NS bolus x2, likely pre-renal from peri-operative underresuscitation  - Foley catheter in place      #ID/ Heme: Pt had large aspiration event during induction of anesthesia prior to surgery. She is being treated with Zosyn empirically to prevent pneumonitis/pneumonia.  - Daily CBC  - ABX: Zosyn IV x 5 days (7/1-7/6)    #PPx   - SCDs  - OOB to chair today and walking as tolerated    #Disposition  - D/C home when tolerating regular diet and pain is controlled on PO pain regimen.  - No home health needs anticipated.  - Please page [phone] (surgery, [place] team) with any questions.  - Code Status: Prior    Patient seen with General Surgery Team and plan of care discussed with Attending, Alonna Buckler., MD, who is in agreement.     Author:    Remi Deter, MD  11:54 AM 06/23/2019  Division of General Surgery  [facility]

## 2019-06-23 NOTE — Other
Patients Clinical Goal:   Clinical Goal(s) for the Shift: VSS, pain management, drain care, foley care, treat/prevent infection, safety and comfort, patient education  Identify possible barriers to advancing the care plan: none  Stability of the patient: Moderately Stable - low risk of patient condition declining or worsening   End of Shift Summary:     Hx: multiple prior surgeries d/t PUD    Problems: N/V and abdominal pain s/p  s/p scheduled laparoscopic vagotomy and antrectomy, gastrojejunostomy, and hiatal hernia repair (06/21/19), possible Aspiration PNA       Plan of Care Response and Outcomes (explanation of POC effectiveness or alterations made):        PCA and PCEA in progress via pump.   Patinet refused Tylenol IV, states it does not work, wants to speak with MD about it - will endorse to day RN    Abdomina R drain output 25 cc, bloody    Abdominal L drain output 20 cc, bloody    NSR to ST on cardiac monitor.    NGT in place to low continuous wall suction, brown output, 230 cc brown/black output overnight   Kept NPO. Oral hygiene encouraged.   Foley in place - foley care provided   Continuous pulse oxymetry - 90% to 96% overnight requiring 4L-5L NC   Encouraged use of IS   Continued Zosyn IVPB for possible aspiration PNA     PT eval is pending     Change in Status or Condition (concerning observations, events or assessments, and interventions):     None    Interdisciplinary Communication (team member communication):     None    Psychosocial Communication (family of patient issues potentially affecting care):    None    Problem/Goal Progression (in relation to specific goals of care or hospitalization):    D/c home when tolerating diet and pain is well managed     BOOST 8 P's Complete Or Pending BOOST #s  Refer to D.A.N. for specific details and progress.

## 2019-06-23 NOTE — Other
Patients Clinical Goal:   Clinical Goal(s) for the Shift: Safety, pain control, stable vital signs.  Identify possible barriers to advancing the care plan: None  Stability of the patient: Moderately Stable - low risk of patient condition declining or worsening   End of Shift Summary:     ??? Vss, a/o x4, no sign of distress noted throughout shift  ??? Remains NPO, oral care encouraged  ??? Pain managed with PCEA and PCA pump, see MAR  ??? Per patient pain is well controlled  ??? Foley catheter in place draining to gravity  ??? mIVF D5LR @ 78ml/hr  ??? On 3L via NC sating between 93% - 99%; remains on continuous pulse ox   ??? JP drain x2, sanguineous output   ??? NGT to low intermittent suction; dark brown-green output  ??? POC: d/c home when tolerating diet and pain is controlled on po pain regimen,

## 2019-06-23 NOTE — Progress Notes
NUTRITION IN-DEPTH SCREEN (Adult)    Admit Date: 06/22/2019     Date of Birth: 1966-05-03 Gender: female MRN: [id number]     Date of Screening 06/23/2019   Subjective: Pt.is NPO.Per pt/RN, she has no N/V or diarrhea.now.Pt.has an NGT for suction and may have surgery.Per pt.,she has no food allergy and takes regular diet at home.   Problems: Active Problems:    Acute epigastric pain POA: Yes    Peptic ulcer disease POA: Yes       Past Medical History:   Diagnosis Date    Anxiety     Depression     Emphysema, unspecified (HCC/RAF)     Fibromyalgia     Gastritis     GERD (gastroesophageal reflux disease)     Pancreatitis     Past Surgical History:   Procedure Laterality Date    ABDOMINAL SURGERY      COLON SURGERY           Anthropometrics     Height: 162.6 cm (5' 4'')(on 06/02/2019)  Admit Weight: (No adm wt.) (06/23/19 1558) Last 5 recorded weights:  Weights 12/26/2015 03/09/2019 03/09/2019 06/02/2019 06/23/2019   Weight 49.9 kg 59.4 kg 59.4 kg 64 kg (No Data)                       Usual Weight: 59.4 kg (131 lb)(on 03/09/2019 per record.)         Allergies   Hydrocodone and Ibuprofen     Cultural / Religious / Ethnic Food Preferences   None       Nutrition Prior to Admission   Follows a regular diet per pt.     Nutrition Risk Factors      Acuity Level: 1-No risk identified        Diet Orders     Diets/Supplements/Feeds   Diet    Diet NPO     Start Date/Time: 06/21/19 0000      Number of Occurrences: Until Specified        Impression   PO % consumed: NPO  Impression: Pt currently NPO     Diet Education   No diet education needs at this time  Is NPO.    FDI Target Drugs: No          Nutrition Care Plan   Plan: Trend weights, Monitor days NPO    Comments: Monitor advancement of diet If NPO is 5 days,will refer to the RD.      Next Follow-up by 06/28/19    Author:  Clotilde Dieter, DTR, pager [phone]  06/23/2019 4:03 PM

## 2019-06-23 NOTE — Progress Notes
Pharmaceutical Services ? Admission Medication Reconciliation Note      Patient Name: Donna Gross  Medical Record Number: [id number]  Admit date: 06/22/2019 11:44 AM    Age: 53 y.o.  Sex: female  Allergies: Hydrocodone and Ibuprofen  Height:   Most recent documented height   06/02/19 1.626 m (5' 4'')     Actual Weight:   Most recent documented weight   06/02/19 64 kg   03/09/19 59.4 kg     Diagnosis: The patient is currently admitted with the following concerns/issues: Active Problems:    Acute epigastric pain POA: Yes    Peptic ulcer disease POA: Yes      Reported Medication History   Pharmacy technician, Lorenda Ishihara, spoke with the patient and utilized outside records activity to update the home medication list for this hospital admission..     PTA Medication List (discrepancies are noted)   Prior to Admission medications as of 06/23/19 1530   Medication Sig Notes Last Dose   albuterol (2.5 mg/84mL) 0.083% nebulizer solution 2.5 mg, Nebulization, Every 8 hours PRN  continued inhaler 06/18/2019   albuterol 90 mcg/act inhaler 2 puffs, Inhalation, Every 8 hours PRN  not re-ordered 06/19/2019   aluminum-magnesium hydroxide-simethicone (ALMACONE) 200-200-20 mg/5 mL suspension 5 mLs, Oral, 3 times daily  not re-ordered    clobetasol 0.05% cream Topical, 3 times daily PRN  not re-ordered 06/19/2019   cyclobenzaprine 10 mg tablet 10 mg, Oral, 2 times daily PRN  not re-ordered    fluticasone-salmeterol (WIXELA INHUB) 250-50 mcg/dose diskus 1 puff, Inhalation, 2 times daily PRN   06/18/2019   ipratropium-albuterol 20-100 mcg/act inhaler 2 puffs, Inhalation, 3 times daily PRN  continued albuterol     lidocaine viscous 2% oral solution 5 mLs, Swish & Spit, 3 times daily not re-ordered    PANTOPRAZOLE SODIUM PO 40 mg, Oral, Daily  re-ordered IV push  06/20/2019   promethazine 6.25 mg/5 mL solution 6.25 mg, Oral, Every 6 hours PRN Ordered ondansetron     quetiapine 100 mg tablet 100 mg, Oral, 2 times daily Re-ordered Discharge Prescription Preference:   [facility] [place], [place] - [phone]  [phone] [place] [address]    Medication Changes   The following medication changes were made when updating the medication list and are included in above PTA home medication list:  Medications added:  1. Albuterol 2.5mg  neb q8h prn SOB   2. Albuterol 59mcg/act - 2 puff q8h prn SOB   3. Almacone + Lidocaine TID   4. Clobetasol 0.05% cream TID prn rash   5. Cyclobenzaprine 10mg  BID prn muscle spasm   6. Wixela 1 puff BID   7. Combivent 2 puff BID prn SOB   8. Pantoprazole 40mg  daily   9. Promethazine 6.25mg  q6h prn cough  Medication removed:    10. Atorvastatin 40mg  daily - pt not taking, last prescribed in 2017   11. Bismuth subsalicylate - per pt not taking   12. Fluoxetine 10mg  daily - per pt not taking, no recent pickup history   13. Hydromorphone 4mg  q4n prn pain - not taking, not picked up in last 12 months per cures   14. Metronidazole 500mg  q6h - prescribed in 2017, therapy likely completed   15. Nicotine patch -  Per pt not using   16. Ondansetron 4mg  q8h prn nausea - per pt not taking   17. Simethicone 80mg  - per pt not taking   18.  Sucralfate 1 gram qid - per pt not taking   19. Tetracycline qid - last prescribed in 2017       Reconciliation  The assessment and reconciliation of admission and inpatient orders with PTA medication list is complete. Consider re-ordering the following home medications which differ from inpatient orders if clinically indicated:  20. Advair 1 puff BID - pt was compliant at home, please resume if pt is clinically indicated      The patient's medications have been reviewed and the PTA med list is updated.     Slevin Gunby Orson Slick Annice Pih) Albin Felling, PharmD. BCPS  Clinical Pharmacist, Med Surg       Deloras Reichard Kennon Rounds, PharmD, 06/23/2019, 3:18 PM

## 2019-06-23 NOTE — Other
Patients Clinical Goal:   Clinical Goal(s) for the Shift: VSS, pain management, respiratory hygiene, OOB to chair  Identify possible barriers to advancing the care plan: n/a  Stability of the patient: Moderately Unstable - medium risk of patient condition declining or worsening    End of Shift Summary:     Review of Systems:   A&Ox4, calm and cooperative, able to make needs known. SpO2 >93% on RA. Abdomen softand non distended. IVF running per order. Voiding. Skin intact. Last BM 6/30. BMAT 3. Pt left to Sx via gurney in stable condition    During this shift:  Pt c/o abdominal, back pain controlled with Tylenol IV, PCEA, and Dilaudid PCA with relief.  Epidural catheter c/d/i. Encouraged to use incentive spironmeter=1013ms. PT remains NPO. Lt nare NGT to on LCWS with greenish brown output. Rt and Lt abdomen JP drains in place, c/d/i. Received IV ABX Zosyn. Voiding Via F/C. Pt requested to get up OOB later in the afternoon. Benadryl IVP administered for generalized itching. Now resting in bed stable condition, otherwise, no acute events during this shift, VSS, all needs met, will endorse to oncoming RN.   Pending Labs/Tests/Procedures/Treatments: N/A    POC: Fall precautions, pain management, Strict I/Os, Incentive spironmeter  DCP: D/C home pending clinical improvement. Pending workup.

## 2019-06-23 NOTE — Progress Notes
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 [K26.7]  POSTOPERATIVE DAY #:2    Subjective:     Analgesic Therapy:    Catheter type: thoracic epidural catheter (T8-9)                                                                                                                                  Systemic analgesics:    Pain Meds (240h ago, onward)     Start     Ordered    06/22/19 1215  bupivacaine PF 0.1 %, fentaNYL 4 mcg/mL in sodium chloride 0.9% 250 mL epidural  (fentanyl-bupivacaine epidural)  Continuous     Question Answer Comment   If analgesia inadequate/pain >4/10, give clinical dose of ___ (mL) 4    And raise basal infusion by ___ mL/hr; MR x1 in 1 hour 1        06/22/19 1147    06/22/19 0815  acetaminophen IV inj 1,000 mg  Every 8 hours provider specified     Question Answer Comment   Is this patient currently on the Cystectomy ERAS protocol? No    Is the patient NPO? Yes    Does the patient have respiratory depression from opioids? No    Does the patient have postoperative ileus or opioid-induced ileus? Yes    Is the patient at least 54 years old? No    Does the patient have severe hepatic impairment or severe active liver disease? No        06/22/19 0747    06/21/19 2252  HYDROmorphone 1 mg/mL inj 0.4 mg  Every 2 hours PRN      06/21/19 2253    06/21/19 1609  HYDROmorphone 1 mg/mL inj 0.1 mg  (Hydromorphone IV)  Every 10 min PRN,   Status:  Discontinued      06/21/19 1609    06/21/19 1609  HYDROmorphone 1 mg/mL inj 0.2 mg  (Hydromorphone IV)  Every 10 min PRN,   Status:  Discontinued      06/21/19 1609    06/21/19 1609  HYDROmorphone 1 mg/mL inj 0.4 mg  (Hydromorphone IV) Every 10 min PRN,   Status:  Discontinued      06/21/19 1609    06/21/19 1522  bupivacaine-EPINEPHrine PF 0.5%-1:200000 inj  As needed for,   Status:  Discontinued      06/21/19 1522    06/21/19 0945  fentaNYL (PF) 4 mcg/mL, bupivacaine PF 0.1 % in sodium chloride 0.9% 250 mL epidural  (fentanyl-bupivacaine epidural)  Continuous,   Status:  Discontinued     Question Answer Comment   If  analgesia inadequate/pain >4/10, give clinical dose of ___ (mL) 4    And raise basal infusion by ___ mL/hr; MR x1 in 1 hour 1        06/21/19 0912    06/21/19 0945  fentaNYL 4 mcg/mL, bupivacaine PF 0.1 % in sodium chloride 0.9% 250 mL epidural  (fentanyl-bupivacaine epidural)  Continuous,   Status:  Discontinued     Question Answer Comment   If analgesia inadequate/pain >4/10, give clinical dose of ___ (mL) 4    And raise basal infusion by ___ mL/hr; MR x1 in 1 hour 1        06/21/19 0916    06/21/19 0830  HYDROmorphone 1 mg/mL inj 0.2 mg  (Hydromorphone Bolus (Bag or Injection))  Once      06/21/19 0800    06/21/19 0021  HYDROmorphone 1 mg/mL inj 0.2 mg  (Hydromorphone Bolus (Bag or Injection))  Every 2 hours PRN,   Status:  Discontinued      06/21/19 0022    06/21/19 0021  HYDROmorphone 1 mg/mL inj 0.4 mg  Every 2 hours PRN,   Status:  Discontinued      06/21/19 0022    06/20/19 1703  acetaminophen tab 500 mg  Every 4 hours PRN,   Status:  Discontinued      06/20/19 1703    06/20/19 1702  HYDROmorphone 1 mg/mL inj 0.2 mg  (Hydromorphone Bolus (Bag or Injection))  Every 2 hours PRN,   Status:  Discontinued      06/20/19 1702    06/20/19 1515  HYDROmorphone 1 mg/mL inj 1 mg  STAT      06/20/19 1504    06/20/19 1145  HYDROmorphone 1 mg/mL inj 1 mg  STAT      06/20/19 1140    Signed and Held  acetaminophen IV inj 1,000 mg  Once     Question Answer Comment   Is this patient currently on the Cystectomy ERAS protocol? No    Is the patient NPO? Yes    Does the patient have respiratory depression from opioids? Yes Does the patient have postoperative ileus or opioid-induced ileus? Yes    Is the patient at least 54 years old? No    Does the patient have severe hepatic impairment or severe active liver disease? Yes        Signed and Held                                                                                           Anticoagulants: none    Recent events: Patient doing much better today, able to move around in bed easily, much more comfortable.    History of Present Illness:    Pain    Pain location: Abdomen and Back pain    Pain level at Rest: 4       Pain level with Activity:6    Description: Sharp     Worse with: Movement     Review of Systems:     Itching: 0   Nausea: 0  (Itching and Nausea: 0=none, 1=present, not treated;2=relieved by med;3=treated, not relieved)  Urination: Foley in        Ability to Sleep: Good    Ambulation: Not attempting      Diet:  ice chips     Evidence of orthostasis: No    Flatus: No    Bowel Movement: No    Objective:     Physical Exam:    BP 107/73 (Patient Position: Sitting)  ~ Pulse 95  ~ Temp 37.1 ?C (98.8 ?F) (Oral)  ~ Resp 18  ~ SpO2 93%     I/O last 3 completed shifts:  In: 650 [I.V.:50; IV Piggyback:600]  Out: 2595 [Urine:1800; Emesis/GI output:630; Drains:165]  I/O this shift:  In: -   Out: 450 [Urine:450]    Catheter site:  Clean, Dry and Intact    Catheter site depth at skin: 13 cm    Catheter site tender to palpation: No    Neuro/MSK:       Sensory block present to cold/sharp on bilaterally T10 -T3 level    Gait and station: Unable to examine    Lower extremity strength:  Normal     Sedation: 0  (0 = alert, 1 = drowsy, eyes open, 2 = drowsy, eyes closed, arousable, 3 = unarousable    Mood/Affect:  Agitated from pain    Laboratory Data:      Lab Results   Component Value Date    WBC 14.21 (H) 06/23/2019    HGB 8.3 (L) 06/23/2019    HCT 26.7 (L) 06/23/2019    MCV 92.4 06/23/2019    PLT 322 06/23/2019       Assessment: Donna Gross is a 53 y.o. female. The patient  has a past medical history of Anxiety, Depression, Emphysema, unspecified (HCC/RAF), Fibromyalgia, Gastritis, GERD (gastroesophageal reflux disease), and Pancreatitis..     S/p Procedure(s):  LAPAROSCOPY ; VAGOTOMY AND ANTRECTOMY, GASTROJEJUNOSTOMY AND HIATAL HERNIA REPAIR     Patient reports good pain control.    Plan/ Recommendation:     > Continue thoracic epidural catheter at current settings until POD 4-5 or until patient taking PO meds.  > Continue dilaudid PCA  > Encourage PO/IV supplemental analgesia   > Encourage OOB and ambulation  > All questions answered  > Further management per primary  > Thank you for allowing [place] to participate in the care of this patient. Please call pager [phone] / phone (7AM- 3PM) [phone] for any questions/concerns.      Author:  Tye Savoy. Leavy Cella, MD 06/23/2019 11:47 AM

## 2019-06-24 ENCOUNTER — Ambulatory Visit: Payer: PRIVATE HEALTH INSURANCE

## 2019-06-24 LAB — Basic Metabolic Panel
CHLORIDE: 100 mmol/L (ref 96–106)
CHLORIDE: 100 mmol/L (ref 96–106)

## 2019-06-24 LAB — Differential Automated: IMMATURE GRANULOCYTES%: 0.9 (ref 0.00–0.10)

## 2019-06-24 LAB — CBC: RED BLOOD CELL COUNT: 2.91 x10E6/uL — ABNORMAL LOW (ref 3.96–5.09)

## 2019-06-24 LAB — Magnesium: MAGNESIUM: 1.3 meq/L — ABNORMAL LOW (ref 1.4–1.9)

## 2019-06-24 LAB — Phosphorus: PHOSPHORUS: 2.7 mg/dL (ref 2.3–4.4)

## 2019-06-24 MED ADMIN — ONDANSETRON HCL 4 MG/2ML IJ SOLN: 4 mg | INTRAVENOUS | @ 23:00:00 | Stop: 2019-06-24 | NDC 60505613005

## 2019-06-24 MED ADMIN — PCEA FENTANYL-BUPIVACAINE: 250 mL | EPIDURAL | @ 09:00:00 | Stop: 2019-06-25 | NDC 63323047237

## 2019-06-24 MED ADMIN — DIPHENHYDRAMINE IVPB: 25 mg | INTRAVENOUS | @ 05:00:00 | Stop: 2019-06-24 | NDC 63323066401

## 2019-06-24 MED ADMIN — PIPERACILLIN-TAZOBACTAM 3.375 GM/50 ML RTU (EXT 4HR): 3.375 g | INTRAVENOUS | @ 16:00:00 | Stop: 2019-06-27 | NDC 00206886102

## 2019-06-24 MED ADMIN — ACETAMINOPHEN 10 MG/ML IV SOLN: 1000 mg | INTRAVENOUS | @ 15:00:00 | Stop: 2019-06-25 | NDC 43825010201

## 2019-06-24 MED ADMIN — QUETIAPINE FUMARATE 100 MG PO TABS: 100 mg | ORAL | @ 15:00:00 | Stop: 2019-07-03

## 2019-06-24 MED ADMIN — SODIUM CHLORIDE 0.9 % IV SOLN: 10 mL/h | INTRAVENOUS | @ 09:00:00 | Stop: 2019-06-25

## 2019-06-24 MED ADMIN — ACETAMINOPHEN 10 MG/ML IV SOLN: 1000 mg | INTRAVENOUS | @ 05:00:00 | Stop: 2019-06-24 | NDC 43825010201

## 2019-06-24 MED ADMIN — DEXTROSE 5 %/LACTATED RINGERS: 75 mL/h | INTRAVENOUS | @ 09:00:00 | Stop: 2019-06-26 | NDC 00338012504

## 2019-06-24 MED ADMIN — PIPERACILLIN-TAZOBACTAM 3.375 GM/50 ML RTU (EXT 4HR): 3.375 g | INTRAVENOUS | @ 02:00:00 | Stop: 2019-06-27 | NDC 00206886102

## 2019-06-24 MED ADMIN — MAGNESIUM SULFATE 4 GM/100ML IV SOLN: 4 g | INTRAVENOUS | @ 15:00:00 | Stop: 2019-06-24 | NDC 00409672923

## 2019-06-24 MED ADMIN — ACETAMINOPHEN 10 MG/ML IV SOLN: 1000 mg | INTRAVENOUS | @ 15:00:00 | Stop: 2019-06-24

## 2019-06-24 MED ADMIN — PCEA FENTANYL-BUPIVACAINE: 250 mL | EPIDURAL | @ 12:00:00 | Stop: 2019-06-25

## 2019-06-24 MED ADMIN — QUETIAPINE FUMARATE 100 MG PO TABS: 100 mg | ORAL | @ 04:00:00 | Stop: 2019-07-03

## 2019-06-24 MED ADMIN — PANTOPRAZOLE SODIUM 40 MG IV SOLR: 40 mg | INTRAVENOUS | @ 05:00:00 | Stop: 2019-06-28 | NDC 00143928410

## 2019-06-24 MED ADMIN — ACETAMINOPHEN 10 MG/ML IV SOLN: 1000 mg | INTRAVENOUS | @ 23:00:00 | Stop: 2019-06-25 | NDC 43825010201

## 2019-06-24 MED ADMIN — HYDROMORPHONE 10 MG/50 ML PCA SYRINGE (503B): 50 mL | INTRAVENOUS | @ 11:00:00 | Stop: 2019-06-25 | NDC 70092111450

## 2019-06-24 MED ADMIN — PIPERACILLIN-TAZOBACTAM 3.375 GM/50 ML RTU (EXT 4HR): 3.375 g | INTRAVENOUS | @ 08:00:00 | Stop: 2019-06-27 | NDC 00206886102

## 2019-06-24 NOTE — Progress Notes
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 [K26.7]  POSTOPERATIVE DAY #:3    Subjective:     Analgesic Therapy:    Catheter type: thoracic epidural catheter (T8-9)                                                                                                                                  Systemic analgesics:    Pain Meds (240h ago, onward)     Start     Ordered    06/22/19 1215  bupivacaine PF 0.1 %, fentaNYL 4 mcg/mL in sodium chloride 0.9% 250 mL epidural  (fentanyl-bupivacaine epidural)  Continuous     Question Answer Comment   If analgesia inadequate/pain >4/10, give clinical dose of ___ (mL) 4    And raise basal infusion by ___ mL/hr; MR x1 in 1 hour 1        06/22/19 1147    06/22/19 0815  acetaminophen IV inj 1,000 mg  Every 8 hours provider specified     Question Answer Comment   Is this patient currently on the Cystectomy ERAS protocol? No    Is the patient NPO? Yes    Does the patient have respiratory depression from opioids? No    Does the patient have postoperative ileus or opioid-induced ileus? Yes    Is the patient at least 54 years old? No    Does the patient have severe hepatic impairment or severe active liver disease? No        06/22/19 0747    06/21/19 2252  HYDROmorphone 1 mg/mL inj 0.4 mg  Every 2 hours PRN      06/21/19 2253    06/21/19 1609  HYDROmorphone 1 mg/mL inj 0.1 mg  (Hydromorphone IV)  Every 10 min PRN,   Status:  Discontinued      06/21/19 1609    06/21/19 1609  HYDROmorphone 1 mg/mL inj 0.2 mg  (Hydromorphone IV)  Every 10 min PRN,   Status:  Discontinued      06/21/19 1609    06/21/19 1609  HYDROmorphone 1 mg/mL inj 0.4 mg  (Hydromorphone IV) Every 10 min PRN,   Status:  Discontinued      06/21/19 1609    06/21/19 1522  bupivacaine-EPINEPHrine PF 0.5%-1:200000 inj  As needed for,   Status:  Discontinued      06/21/19 1522    06/21/19 0945  fentaNYL (PF) 4 mcg/mL, bupivacaine PF 0.1 % in sodium chloride 0.9% 250 mL epidural  (fentanyl-bupivacaine epidural)  Continuous,   Status:  Discontinued     Question Answer Comment   If  analgesia inadequate/pain >4/10, give clinical dose of ___ (mL) 4    And raise basal infusion by ___ mL/hr; MR x1 in 1 hour 1        06/21/19 0912    06/21/19 0945  fentaNYL 4 mcg/mL, bupivacaine PF 0.1 % in sodium chloride 0.9% 250 mL epidural  (fentanyl-bupivacaine epidural)  Continuous,   Status:  Discontinued     Question Answer Comment   If analgesia inadequate/pain >4/10, give clinical dose of ___ (mL) 4    And raise basal infusion by ___ mL/hr; MR x1 in 1 hour 1        06/21/19 0916    06/21/19 0830  HYDROmorphone 1 mg/mL inj 0.2 mg  (Hydromorphone Bolus (Bag or Injection))  Once      06/21/19 0800    06/21/19 0021  HYDROmorphone 1 mg/mL inj 0.2 mg  (Hydromorphone Bolus (Bag or Injection))  Every 2 hours PRN,   Status:  Discontinued      06/21/19 0022    06/21/19 0021  HYDROmorphone 1 mg/mL inj 0.4 mg  Every 2 hours PRN,   Status:  Discontinued      06/21/19 0022    06/20/19 1703  acetaminophen tab 500 mg  Every 4 hours PRN,   Status:  Discontinued      06/20/19 1703    06/20/19 1702  HYDROmorphone 1 mg/mL inj 0.2 mg  (Hydromorphone Bolus (Bag or Injection))  Every 2 hours PRN,   Status:  Discontinued      06/20/19 1702    06/20/19 1515  HYDROmorphone 1 mg/mL inj 1 mg  STAT      06/20/19 1504    06/20/19 1145  HYDROmorphone 1 mg/mL inj 1 mg  STAT      06/20/19 1140    Signed and Held  acetaminophen IV inj 1,000 mg  Once     Question Answer Comment   Is this patient currently on the Cystectomy ERAS protocol? No    Is the patient NPO? Yes    Does the patient have respiratory depression from opioids? Yes Does the patient have postoperative ileus or opioid-induced ileus? Yes    Is the patient at least 54 years old? No    Does the patient have severe hepatic impairment or severe active liver disease? Yes        Signed and Held                                                                                           Anticoagulants: none    Recent events: Patient doing much better today, able to move around in bed easily, much more comfortable.    History of Present Illness:    Pain    Pain location: Abdomen and Back pain    Pain level at Rest: 4       Pain level with Activity:6    Description: Sharp     Worse with: Movement     Review of Systems:     Itching: 0   Nausea: 0  (Itching and Nausea: 0=none, 1=present, not treated;2=relieved by med;3=treated, not relieved)  Urination: Normal       Ability to Sleep: Good    Ambulation: Able      Diet:  ice chips     Evidence of orthostasis: No    Flatus: No    Bowel Movement: No    Objective:     Physical Exam:    BP 118/84  ~ Pulse (!) 103  ~ Temp 36.9 ???C (98.4 ???F) (Oral)  ~ Resp 18  ~ Ht 1.626 m (5' 4'') Comment: on 06/02/2019 ~ SpO2 (!) 92%  ~ BMI 24.22 kg/m???     I/O last 3 completed shifts:  In: 1173.1 [I.V.:1153.1; Other:20]  Out: 4654 [Urine:3550; Emesis/GI output:980; Drains:124]  I/O this shift:  In: -   Out: 410 [Urine:350; Emesis/GI output:50; Drains:10]    Catheter site:  Clean, Dry and Intact    Catheter site depth at skin: 13 cm    Catheter site tender to palpation: No    Neuro/MSK:       Sensory block present to cold/sharp on bilaterally T10 -T3 level    Gait and station: Unable to examine    Lower extremity strength:  Normal     Sedation: 0  (0 = alert, 1 = drowsy, eyes open, 2 = drowsy, eyes closed, arousable, 3 = unarousable    Mood/Affect:  Bright     Laboratory Data:      Lab Results   Component Value Date    WBC 13.70 (H) 06/24/2019    HGB 8.3 (L) 06/24/2019    HCT 26.6 (L) 06/24/2019    MCV 91.4 06/24/2019    PLT 325 06/24/2019       Assessment: Donna Gross is a 53 y.o. female. The patient  has a past medical history of Anxiety, Depression, Emphysema, unspecified (HCC/RAF), Fibromyalgia, Gastritis, GERD (gastroesophageal reflux disease), and Pancreatitis..     S/p Procedure(s):  LAPAROSCOPY ; VAGOTOMY AND ANTRECTOMY, GASTROJEJUNOSTOMY AND HIATAL HERNIA REPAIR     Patient reports good pain control.    Plan/ Recommendation:     > Continue thoracic epidural catheter at current settings until POD 4-5 or until patient taking PO meds.  > Continue dilaudid PCA  > Encourage PO/IV supplemental analgesia   > Encourage OOB and ambulation  > All questions answered  > Further management per primary  > Thank you for allowing [place] to participate in the care of this patient. Please call pager [phone] / phone (7AM- 3PM) [phone] for any questions/concerns.      Author:  Tye Savoy. Leavy Cella, MD 06/24/2019 10:54 AM

## 2019-06-24 NOTE — Other
Patients Clinical Goal: ''Being able to sleep''  Clinical Goal(s) for the Shift: VSS, cardiac monitoring and pulse ox monitoring, PCEA/PCA, drain care, foley care, comfort, rest, safety, sleep hygiene  Identify possible barriers to advancing the care plan: None  Stability of the patient: Moderately Unstable - medium risk of patient condition declining or worsening    End of Shift Summary: Received pt A/Ox4, on cardiac monitor showing SR. Pt on continuous o2 monitoring sating above 93% on 4L. Pt did not tolerate titration to RA and desats to low 80s. Pain controlled w/ PCEA bupivacaine/fentanyl 07/23/09/3, PCA dilaudid 0/0.4/10/2. IVF D5/LR infusing @ 80ml/hr. JPs on R and L to bulb suction, dressing on R JP changed due to drainage on previous R dressing. Pt voiding clear yellow urine via foley. Last BM 06/20/2019, pt not passing gas. NGT to LCWS, bilious drainage noted. Pt tolerated sitting up in bed, plan is to ambulate today. Safety precautions maintained. All pt needs attended to. Call light within reach. Plan of care for ambulation today. Will endorse plan of care.

## 2019-06-24 NOTE — Progress Notes
INPATIENT H&P / GENERAL SURGERY / [place] [facility] [phone]    PATIENT:  Donna Gross  MRN:  [id number]  DOB:  06/24/1966  DATE OF SERVICE:  06/24/2019    GENERAL SURGERY ATTENDING PHYSICIAN:  Dr. [name]  PRIMARY CARE PHYSICIAN:  [facility], MD    HPI:  Donna Gross is a 53 y.o. female with complex peptic ulcer history who presented to ED with 3 days of N/V and abdominal pain on 06/20/19. She was admitted the night before scheduled surgery. She is now POD1 s/p scheduled laparoscopic vagotomy and antrectomy, gastrojejunostomy, and hiatal hernia repair (06/21/19).     Interval Events/Subjective:    06/21/19: Pt with nausea but no emesis overnight.  Has c/o of constant abdominal pain. Pt has been NPO for surgery.  06/22/19: NGT left in place after surgery. Patient enduring moderate post-operative pain. Was evaluated without evidence of acute pathology, bleed, or infection. Started on Dilaudid prn for post-operative pain (has chronic pain and moderate opioid requirements at home) in addition to existing PCEA. Patient continues to have pain this morning that is under improved control. Denies nausea or vomiting.  06/23/19: Required 500cc NS bolus x 2 yesterday for rising Cr and SBPs to low 100s. Also started IV tylenol and Dilaudid PCA for pain control. Pt reports pain well controlled this morning. Denies N/V, fevers, or chills. Requiring nasal cannula to maintain O2 sats>92%, but pt denies SOB, respiratory distress, or chest pain.  06/24/19: No acute events. Pulled NGT this morning herself confusing it for nasal cannula. NGT not replaced. Denies passing gas or having BM. O2 requirements decreasing, on 1L NC as of this AM. Complained of itching yesterdayrequiring IV Benadryl x 2. Denies fevers, chills, N/V, or new/worsening abdominal pain other than incisional tenderness.    Temp:  [36.7 ???C (98 ???F)-37.4 ???C (99.4 ???F)] 36.7 ???C (98 ???F)  Heart Rate:  [90-104] 98  Resp:  [18-23] 20  BP: (90-146)/(74-86) 114/74 NBP Mean:  [81-103] 87  SpO2:  [92 %-93 %] 93 %       I/O last 3 completed shifts:  In: 1173.1 [I.V.:1153.1; Other:20]  Out: 4654 [Urine:3550; Emesis/GI output:980; Drains:124]    Diets/Supplements/Feeds   Diet    Diet NPO     Start Date/Time: 06/21/19 0000      Number of Occurrences: Until Specified       Scheduled Meds:  ? acetaminophen  1,000 mg Intravenous Q8H   ? pantoprazole  40 mg IV Push Q24H   ? piperacillin/tazobactam  3.375 g Intravenous Q8H   ? QUEtiapine  100 mg Oral BID       PRN Meds:  albuterol, HYDROmorphone **AND** sodium chloride **AND** naloxone, ondansetron    Exam:  Current BP 114/74  ~ Pulse 98  ~ Temp 36.7 ?C (98 ?F) (Oral)  ~ Resp 20  ~ Ht 1.626 m (5' 4'') Comment: on 06/02/2019 ~ SpO2 93%  ~ BMI 24.22 kg/m?   Gen: NAD  HEENT: NCAT, EOMI, MMM, neck supple. NGT in place initially on rounds, later noted to be removed. NGT output bilious though noted to be lighter and more yellow than days prior.   CV: RRR, nl s1 s2  Pulm: Normal respiratory excursions, not tachypneic, no labored breathing.   Abd: Soft, nondistended. Multiple prior incisions, feeding tube sites, drain sites. Has mild expected incisional tenderness, no abdominal tenderness. Incisions clean/dry/intact with dressings in place. Has two abdominal JP drains with scant serosanguineous output.  GU: Foley catheter in place  draining clear yellow urine.  Ext: warm, well perfused, no cyanosis, clubbing, or edema  Neuro: A&Ox3  Drains: abdominal JPx2. Both with scant serosanguineous output.  Output by Drain (mL) 06/22/19 0701 - 06/22/19 1900 06/22/19 1901 - 06/23/19 0700 06/23/19 0701 - 06/23/19 1900 06/23/19 1901 - 06/24/19 0700 06/24/19 0701 - 06/24/19 1353   Surgical Drain 1 Right Abdomen JP 30 25 25 19 15    Surgical Drain 2 Left Abdomen JP 25 20 20 15 15         Labs:  Recent Labs     06/24/19  0524 06/23/19  0603 06/22/19  0913   HGB 8.3* 8.3* 10.3*   HCT 26.6* 26.7* 33.4*   PLT 325 322 364 No results for input(s): APTT, PT, INR in the last 72 hours.    Recent Labs     06/24/19  0524 06/23/19  0603 06/22/19  0913   CREAT 0.84 1.06 1.54*   BUN 7 14 21    NA 135 137 138   K 4.1 4.6 4.6   CL 100 104 102   CO2 26 20 23    GLUCOSE 84 92 93   CALCIUM 8.9 8.4* 8.2*     Recent Labs     06/24/19  0524 06/23/19  0603 06/22/19  0913   MG 1.3* 1.8 2.3*   PHOS 2.7 2.4 3.6     Recent Labs     06/24/19  0524 06/23/19  0603 06/22/19  0913   GLUCOSE 84 92 93       No results for input(s): TOTPRO, ALBUMIN in the last 72 hours.  No results for input(s): PREALBUMIN in the last 72 hours.   No results for input(s): BILITOT, BILICON, AST, ALT, ALKPHOS, LDH in the last 72 hours.  No results for input(s): INR in the last 72 hours.  No results for input(s): AMYLASE, LIPASE in the last 72 hours.    Micro:   Recent Labs     06/24/19  0524 06/23/19  0603 06/22/19  0913   WBC 13.70* 14.21* 13.32*       Recent Results (from the past 336 hour(s))   COVID-19 PCR, Nasopharyngeal    Collection Time: 06/19/19  3:47 PM   Result Value Ref Range    Specimen Type Respiratory, Upper     COVID-19 PCR Not Detected Not Detected     Imaging:  Xr Chest Ap Portable (1 View)    Result Date: 06/24/2019  IMPRESSION:  Epidural catheter is present. NG/OG is removed. Upper abdominal surgical drains. Allowing for low lung volume the cardiomediastinal silhouette is stable. Mild interstitial pulmonary edema and vascular congestion. Interval increase in small left more than right bilateral pleural effusions with associated atelectasis/consolidation. Decrease in left lateral chest wall subcutaneous emphysema. Signed by: Floy Sabina   06/24/2019 11:42 AM    Xr Kub Portable (1 View)    Result Date: 06/20/2019  IMPRESSION: Moderate stool burden. Normal bowel gas pattern. No intraperitoneal free air. No acute osseous abnormality. I, Quentin Cornwall, M.D., have reviewed this radiological study personally and I am in full agreement with the findings of the report presented here. Dictated by: Carolina Cellar   06/20/2019 4:12 PM Signed by: Quentin Cornwall   06/20/2019 4:31 PM    Xr Chest For Feeding Tube Placement (1 View)    Result Date: 06/22/2019  IMPRESSION: Epidural catheter is present. An enteric tube coursing below diaphragm with the tip projecting over mid gastric body. Upper abdominal surgical drains. Allowing for low  lung volume the cardiomediastinal silhouette is stable. Mild interstitial pulmonary edema and vascular congestion. Left chest and abdominal wall moderate subcutaneous emphysema. Signed by: Benjamine Sprague   06/22/2019 9:12 AM    Assessment/Plan:  Jasmon Mattice is a 54 y.o. female with multiple prior surgeries that stemmed from recurrent peptic ulcer disease for which she now presents. She is now 3 Days Post-Op s/p scheduled laparoscopic vagotomy and antrectomy, gastrojejunostomy, and hiatal hernia repair (06/21/19) c/b aspiration during induction of anesthesia for which patient is on empiric IV abx. Pts O2 requirements have continued to decrease in post-operative setting, on 1L NC as of this AM. CXR (06/24/19) shows mild pleural effusions with atelectasis as well as pulmonary congestion, but no focal consolidations to suggest pneumonia/pneumonitis. Mild post-op AKI resolved. Patient is otherwise recovering well without acute issues.      #Neuro  - Pain Management: IV Tylenol 1000mg  q8h, Fentanyl PCEA, Dilaudid PCA  - Acute Pain consulted, appreciate recs  - Activity as tolerated    #Pulmonary   - Pulm Toileting: Incentive spirometer   - CXR done to evaluate shortness of breath    #GI  - D/c NGT  - Bowel regimen: on hold while NPO  - Ulcer ppx: Protonix 40mg  IV QDay  - Nutrition orders: Diet NPO, maintain IVF  - Daily BMP   - Replete electrolytes prn    #GU:   - AKI resolved in response to NS bolus x2, likely pre-renal from peri-operative underresuscitation  - Foley catheter in place #ID/ Heme: Pt had large aspiration event during induction of anesthesia prior to surgery. She is being treated with Zosyn empirically to prevent pneumonitis/pneumonia.  - Daily CBC  - ABX: Zosyn IV x 5 days (7/1-7/6)    #PPx   - SCDs  - OOB to chair today and walking as tolerated    #Disposition  - D/C home when tolerating regular diet and pain is controlled on PO pain regimen.  - No home health needs anticipated.  - Please page [phone] (surgery, [place] team) with any questions.  - Code Status: Prior    Patient seen with General Surgery Team and plan of care discussed with Attending, Alonna Buckler., MD, who is in agreement.     Author:    Remi Deter, MD  1:53 PM 06/24/2019  Division of General Surgery  [facility]

## 2019-06-24 NOTE — Other
Patients Clinical Goal:   Clinical Goal(s) for the Shift: vss, cardiac and spO2 monitoring, pain management, NGT care, JP drain care, foley care, rest, comfort and safety  Identify possible barriers to advancing the care plan:   Stability of the patient: Moderately Stable - low risk of patient condition declining or worsening   End of Shift Summary:   Received pt A&O x 4.  VSS.  SR-ST on the monitor.  On cont pulse ox, saturation >92% on 4LNC.  Encouraged use of incentive spirometer and aerobika device.  Surgical sites with steri strips, CDI.  JP drains in place with serosanguinous output.  D5LR at 42ml/hr.  Pt accidentally removed NGT, thinking it was nasal cannula, MD Dann informed with no new orders for ngt insertion.  PCEA fentanyl-bupivicaine and PCA dilaudid for pain management.  Educated pt use of PCA pump with understanding.  Tolerating ice chips.  Foley catheter in place, foley care provided.  Pt was seen by PT.  BMAT 3 with FWW.  Encouraged ambulation however pt states she is tired and weak despite education.  Continue with pain management, JP drain care and ambulation.  Safety measures in place.  Call light within reach.     1730-Pt c/o back itchiness, assessed area and saw that PCEA catheter is out.  Spoke to MD Luciana Axe and said to apply tegaderm to tip of catheter and let surgical team know.  Paged MD Fernanda Drum, awaiting response.  Endorsed to [employer] to resume care.     BP 116/76  ~ Pulse 92  ~ Temp 36.8 C (98.2 F) (Oral)  ~ Resp 16  ~ Ht 1.626 m (5' 4'') Comment: on 06/02/2019 ~ SpO2 (!) 92%  ~ BMI 24.22 kg/m

## 2019-06-24 NOTE — Consults
Physical Therapy Evaluation      PATIENT: Donna Gross  MRN: [id number]  DOB: 1966-04-05    ADMIT DATE: 06/22/2019       Date of Evaluation: 06/24/2019    Problems: Active Problems:    Acute epigastric pain POA: Yes    Peptic ulcer disease POA: Yes       Past Medical History:   Diagnosis Date   ??? Anxiety    ??? Depression    ??? Emphysema, unspecified (HCC/RAF)    ??? Fibromyalgia    ??? Gastritis    ??? GERD (gastroesophageal reflux disease)    ??? Pancreatitis     Past Surgical History:   Procedure Laterality Date   ??? ABDOMINAL SURGERY     ??? COLON SURGERY          Relevant Hospital Course: Patient is a 53 y/o female with complex peptic ulcer history. 09/05/2013 - Ex. Lap., repair of duodenal perf., 09/01/2014 - ex. Lap., primary repair of pyloric ulcer, 09/10/2014 - ex. Lap., identify hole in distal transverse colon, diverting ileostomy, 11/2015 - ostomy prolapse, 01/2019 - EGD showed chronic ulcer in duodenal bulb. Patient presented to [facility] on 06/20/2019 with 3 days of N/V and abdominal pain and surgery on 06/21/2019 (ex. Lap, extensive adhesiolysis of the visceral block).    Patient Stated Goal: to go home     Living Arrangements   Type of Home: House  Home Layout: One level, Stairs to enter without rails  # Stairs to enter: 2  Bathroom Shower/Tub: Medical sales representative: Midwife: Grab bars in shower  Home Equipment: 4-wheeled walker    Prior Level of Function   Level of Independence: Independent, Community ambulation, 4-wheeled walker(Does not use 4WW all the time)  Lives With: Daughter, Tax adviser)  Support Available: Family  # of hours available: Family able to help after work  ADL Assistance: Independent  Homemaking Assistance: Independent  Vocation: On disability  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, [place], Distance  Hearing: Within Education administrator: Driven by Others    Precautions   Precautions: Dance movement psychotherapist;Check Labs  Orthotic: None  Current Activity Order: Activity as tolerated Weight Bearing Status: Not Applicable      GENERAL EVALUATION   Patient Presentation: In bed;IV;PCEA;PCA;SCDs;Foley catheter;Cardiac monitor;Oxygen;Pulse ox;Drain    Bed Mobility   Supine Scooting: Not Performed(HOB elevated)  Rolling: Not Performed  Supine to Sit: Stand by Assist;to Left  Sit to Supine: Stand by Assist    Functional Mobility   Sit to Stand: Stand by Assist  Ambulation: Contact Guard Assist;Verbal Cueing  Ambulation Distance (Feet): 51ft  Gait Pattern: Shuffled;Decreased weight shift;Decreased stride length;Decreased pace;Narrow Base of Support  Assistive Device: Front wheeled walker  Wheelchair: Not Performed  Stairs: Not Performed         LE Assessment   RLE Assessment: Within Functional Limits                 LLE Assessment: Within Functional Limits                 Sensation   Sensation: Grossly intact    Cognition   Cognition: Within Defined Limits  Safety Awareness: Good awareness of safety precautions  Barriers to Learning: None    Neurological Evaluation (if indicated)   Neuro Deficits: No    Pain Assessment   Patient complains of pain: Yes  Pain Quality: Aching  Pain Scale Used: Numeric Pain Scale  Pain Intensity: 8/10;at rest  Pain Location:  Abdomen;Incision  Action Taken: Nursing notified;Patient premedicated                             Patient Status   Activity Tolerance: Fair  Oxygen Needs: Nasal cannula  Flow (& FiO2): 5L/min  Response to Treatment: Dizzy;with activity;Fatigued;Pain;Resolved with rest;Nursing notified(1st BP 103/92 (supine), 2nd BP 114/85 (seated), 3rd BP 114/74 (end of session, supine))  Compliance with Precautions: Good  Call light in reach: Yes  Presentation post treatment: In bed;Lines/drains intact;On oxygen;On cardiac monitor;Bed alarm on;Pulse Ox  Comments: Patient demonstrated fair overall mobility. Patient reported ''feeling whoozy'' when seated at the EOB and resolved when educated with diaphramtic breathing.  Patient demonstrated slow gait speed. Patient's SpO2 desat to 68% when ambulating and returned to sitting at EOB and performed diaphramtatic breathing. SpO2 resat to 92%. Patient demonstrated decrease activity tolerance and gait endurance. Patient reported feeling tired at the end of the session and returned to bed.    Interdisciplinary Communication   Interdisciplinary Communication: Nurse(Joan)      ASSESSMENT   Rehab Potential: Fair  Inpatient Recommendation: PT treatment  Problem List: Decreased bed mobility;Decreased transfers;Decreased gait;Stairs;Decreased strength;Decreased endurance;Decreased activity tolerance;Fall risk;Discharge needs  Treatment Plan: Bed mobility training;Transfer training;Gait training;Stair training;Therapeutic exercise;Balance training;Discharge planning  Frequency: 3-5 x/week  Duration (days): 30  Progress Note Due Date: 07/01/19      Goals Discussed With: Patient    Short Term Goals to be achieved in: 7 days  Pt will perform bed mobility: with supervision  Pt will ambulate: 11-30 feet;with FWW;with stand by assist;with verbal cues  Pt will go up/down stairs: 1-2 stairs;with stand by assist    Long Term Goals to be achieved in: 30 days  Pt will perform bed mobility: independently  Pt will ambulate: 101-150 feet;with FWW;with supervision    PT Recommendations   Discharge Recommendation: Physical Therapy;1-3 x/week  Type of therapy: Home Health  Supervision Recommended on Discharge: 12-24 hrs/day;Initially, wean supervision as tolerated  Discharge Equipment Recommended: Agricultural consultant ordered: Not applicable    AM-PAC   AM-PAC Basic Mobility t-Scale Score: 39.67  AM-PAC Basic Mobility CMS 'G Code' Modifier: CK    Evaluation Completed by: Eligha Bridegroom, PT,  06/24/2019

## 2019-06-25 LAB — Phosphorus: PHOSPHORUS: 3.2 mg/dL (ref 2.3–4.4)

## 2019-06-25 LAB — Differential Automated: ABSOLUTE IMMATURE GRAN COUNT: 0.04 10*3/uL (ref 0.00–0.04)

## 2019-06-25 LAB — CBC: RED BLOOD CELL COUNT: 3.1 x10E6/uL — ABNORMAL LOW (ref 3.96–5.09)

## 2019-06-25 LAB — Basic Metabolic Panel
GFR ESTIMATE FOR AFRICAN AMERICAN: 89 mL/min/{1.73_m2} (ref 65–99)
TOTAL CO2: 28 mmol/L (ref 20–30)

## 2019-06-25 LAB — Magnesium: MAGNESIUM: 1.4 meq/L (ref 1.4–1.9)

## 2019-06-25 MED ADMIN — SODIUM CHLORIDE 0.9 % IV SOLN: 10 mL/h | INTRAVENOUS | @ 08:00:00 | Stop: 2019-06-25 | NDC 00338004904

## 2019-06-25 MED ADMIN — HYDROMORPHONE 10 MG/50 ML PCA SYRINGE (503B): 50 mL | INTRAVENOUS | @ 21:00:00 | Stop: 2019-06-27 | NDC 70092111450

## 2019-06-25 MED ADMIN — AQUAPHOR EX OINT: TOPICAL | @ 18:00:00 | Stop: 2019-07-03

## 2019-06-25 MED ADMIN — HYDROMORPHONE 10 MG/50 ML PCA SYRINGE (503B): 50 mL | INTRAVENOUS | @ 17:00:00 | Stop: 2019-06-27 | NDC 70092111450

## 2019-06-25 MED ADMIN — ACETAMINOPHEN 10 MG/ML IV SOLN: 1000 mg | INTRAVENOUS | @ 15:00:00 | Stop: 2019-06-25 | NDC 43825010201

## 2019-06-25 MED ADMIN — DIPHENHYDRAMINE IVPB: 25 mg | INTRAVENOUS | @ 10:00:00 | Stop: 2019-06-25 | NDC 63323066401

## 2019-06-25 MED ADMIN — HYDROXYZINE HCL 25 MG PO TABS: 25 mg | ORAL | @ 09:00:00 | Stop: 2019-06-25

## 2019-06-25 MED ADMIN — PIPERACILLIN-TAZOBACTAM 3.375 GM/50 ML RTU (EXT 4HR): 3.375 g | INTRAVENOUS | @ 16:00:00 | Stop: 2019-06-27 | NDC 00206886102

## 2019-06-25 MED ADMIN — DEXTROSE 5 %/LACTATED RINGERS: 75 mL/h | INTRAVENOUS | @ 01:00:00 | Stop: 2019-06-26 | NDC 00338012504

## 2019-06-25 MED ADMIN — DEXTROSE 5 %/LACTATED RINGERS: 75 mL/h | INTRAVENOUS | @ 13:00:00 | Stop: 2019-06-26 | NDC 00338012504

## 2019-06-25 MED ADMIN — QUETIAPINE FUMARATE 100 MG PO TABS: 100 mg | ORAL | @ 15:00:00 | Stop: 2019-07-03 | NDC 00904664061

## 2019-06-25 MED ADMIN — PIPERACILLIN-TAZOBACTAM 3.375 GM/50 ML RTU (EXT 4HR): 3.375 g | INTRAVENOUS | @ 01:00:00 | Stop: 2019-06-27 | NDC 00206886102

## 2019-06-25 MED ADMIN — HYDROMORPHONE 10 MG/50 ML PCA SYRINGE (503B): 50 mL | INTRAVENOUS | @ 01:00:00 | Stop: 2019-06-25 | NDC 70092111450

## 2019-06-25 MED ADMIN — QUETIAPINE FUMARATE 100 MG PO TABS: 100 mg | ORAL | @ 03:00:00 | Stop: 2019-07-03

## 2019-06-25 MED ADMIN — PANTOPRAZOLE SODIUM 40 MG IV SOLR: 40 mg | INTRAVENOUS | @ 04:00:00 | Stop: 2019-06-28

## 2019-06-25 MED ADMIN — ACETAMINOPHEN 10 MG/ML IV SOLN: 1000 mg | INTRAVENOUS | @ 05:00:00 | Stop: 2019-06-25 | NDC 43825010201

## 2019-06-25 MED ADMIN — AQUAPHOR EX OINT: TOPICAL | @ 23:00:00 | Stop: 2019-07-03

## 2019-06-25 MED ADMIN — PIPERACILLIN-TAZOBACTAM 3.375 GM/50 ML RTU (EXT 4HR): 3.375 g | INTRAVENOUS | @ 08:00:00 | Stop: 2019-06-27 | NDC 00206886102

## 2019-06-25 NOTE — Progress Notes
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 [K26.7]  POSTOPERATIVE DAY #:4    Subjective:     Analgesic Therapy:    Catheter type: thoracic epidural catheter (T8-9)                                                                                                                                  Systemic analgesics:    Pain Meds (240h ago, onward)     Start     Ordered    06/22/19 1215  bupivacaine PF 0.1 %, fentaNYL 4 mcg/mL in sodium chloride 0.9% 250 mL epidural  (fentanyl-bupivacaine epidural)  Continuous     Question Answer Comment   If analgesia inadequate/pain >4/10, give clinical dose of ___ (mL) 4    And raise basal infusion by ___ mL/hr; MR x1 in 1 hour 1        06/22/19 1147    06/22/19 0815  acetaminophen IV inj 1,000 mg  Every 8 hours provider specified     Question Answer Comment   Is this patient currently on the Cystectomy ERAS protocol? No    Is the patient NPO? Yes    Does the patient have respiratory depression from opioids? No    Does the patient have postoperative ileus or opioid-induced ileus? Yes    Is the patient at least 54 years old? No    Does the patient have severe hepatic impairment or severe active liver disease? No        06/22/19 0747    06/21/19 2252  HYDROmorphone 1 mg/mL inj 0.4 mg  Every 2 hours PRN      06/21/19 2253    06/21/19 1609  HYDROmorphone 1 mg/mL inj 0.1 mg  (Hydromorphone IV)  Every 10 min PRN,   Status:  Discontinued      06/21/19 1609    06/21/19 1609  HYDROmorphone 1 mg/mL inj 0.2 mg  (Hydromorphone IV)  Every 10 min PRN,   Status:  Discontinued      06/21/19 1609    06/21/19 1609  HYDROmorphone 1 mg/mL inj 0.4 mg  (Hydromorphone IV) Every 10 min PRN,   Status:  Discontinued      06/21/19 1609    06/21/19 1522  bupivacaine-EPINEPHrine PF 0.5%-1:200000 inj  As needed for,   Status:  Discontinued      06/21/19 1522    06/21/19 0945  fentaNYL (PF) 4 mcg/mL, bupivacaine PF 0.1 % in sodium chloride 0.9% 250 mL epidural  (fentanyl-bupivacaine epidural)  Continuous,   Status:  Discontinued     Question Answer Comment   If  analgesia inadequate/pain >4/10, give clinical dose of ___ (mL) 4    And raise basal infusion by ___ mL/hr; MR x1 in 1 hour 1        06/21/19 0912    06/21/19 0945  fentaNYL 4 mcg/mL, bupivacaine PF 0.1 % in sodium chloride 0.9% 250 mL epidural  (fentanyl-bupivacaine epidural)  Continuous,   Status:  Discontinued     Question Answer Comment   If analgesia inadequate/pain >4/10, give clinical dose of ___ (mL) 4    And raise basal infusion by ___ mL/hr; MR x1 in 1 hour 1        06/21/19 0916    06/21/19 0830  HYDROmorphone 1 mg/mL inj 0.2 mg  (Hydromorphone Bolus (Bag or Injection))  Once      06/21/19 0800    06/21/19 0021  HYDROmorphone 1 mg/mL inj 0.2 mg  (Hydromorphone Bolus (Bag or Injection))  Every 2 hours PRN,   Status:  Discontinued      06/21/19 0022    06/21/19 0021  HYDROmorphone 1 mg/mL inj 0.4 mg  Every 2 hours PRN,   Status:  Discontinued      06/21/19 0022    06/20/19 1703  acetaminophen tab 500 mg  Every 4 hours PRN,   Status:  Discontinued      06/20/19 1703    06/20/19 1702  HYDROmorphone 1 mg/mL inj 0.2 mg  (Hydromorphone Bolus (Bag or Injection))  Every 2 hours PRN,   Status:  Discontinued      06/20/19 1702    06/20/19 1515  HYDROmorphone 1 mg/mL inj 1 mg  STAT      06/20/19 1504    06/20/19 1145  HYDROmorphone 1 mg/mL inj 1 mg  STAT      06/20/19 1140    Signed and Held  acetaminophen IV inj 1,000 mg  Once     Question Answer Comment   Is this patient currently on the Cystectomy ERAS protocol? No    Is the patient NPO? Yes    Does the patient have respiratory depression from opioids? Yes Does the patient have postoperative ileus or opioid-induced ileus? Yes    Is the patient at least 54 years old? No    Does the patient have severe hepatic impairment or severe active liver disease? Yes        Signed and Held                                                                                           Anticoagulants: none    Recent events: Epidural catheter noted by the nurse to be disconnected last night. Infusion stopped and epidural capped.    History of Present Illness:    Pain    Pain location: Abdomen and Back pain    Pain level at Rest: 5       Pain level with Activity:7    Description: Sharp     Worse with: Movement     Review of Systems:     Itching: 2   Nausea: 0  (Itching and Nausea: 0=none, 1=present, not treated;2=relieved by med;3=treated, not relieved)  Urination: Normal       Ability to Sleep: Good    Ambulation: Able      Diet:  ice chips     Evidence of orthostasis: No    Flatus: No    Bowel Movement: No    Objective:     Physical Exam:    BP 108/68  ~ Pulse 76  ~ Temp 37.3 ???C (99.2 ???F) (Axillary)  ~ Resp 18  ~ Ht 1.626 m (5' 4'') Comment: on 06/02/2019 ~ SpO2 95%  ~ BMI 24.22 kg/m???     I/O last 3 completed shifts:  In: 3216.5 [P.O.:300; I.V.:2796.5; Other:20; IV Piggyback:100]  Out: 5399 [Urine:5050; Emesis/GI output:250; Drains:99]  I/O this shift:  In: 350 [I.V.:350]  Out: -     Catheter site:  Noted to be out of the skin    Catheter site depth at skin: 0    Catheter site tender to palpation: No    Neuro/MSK:       Sensory block not assessed    Gait and station: Unable to examine    Lower extremity strength:  Normal     Sedation: 0  (0 = alert, 1 = drowsy, eyes open, 2 = drowsy, eyes closed, arousable, 3 = unarousable    Mood/Affect:  Bright     Laboratory Data:      Lab Results   Component Value Date    WBC 9.80 06/25/2019    HGB 8.8 (L) 06/25/2019    HCT 28.2 (L) 06/25/2019    MCV 91.0 06/25/2019    PLT 355 06/25/2019       Assessment: Donna Gross is a 53 y.o. female. The patient  has a past medical history of Anxiety, Depression, Emphysema, unspecified (HCC/RAF), Fibromyalgia, Gastritis, GERD (gastroesophageal reflux disease), and Pancreatitis..     S/p Procedure(s):  LAPAROSCOPY ; VAGOTOMY AND ANTRECTOMY, GASTROJEJUNOSTOMY AND HIATAL HERNIA REPAIR     Patient reports good pain control.    Plan/ Recommendation:   1. Epidural noted to be no longer in back, tip intact, no erythema or bleeding.  2. Continue dilaudid PCA, recommend transitioning to oxycodone 5/10/15mg  q4hrs when patient able to take PO meds.  3. Nurse advised.  4. Thank you for the consult.> Further management per primary  > Thank you for allowing [place] to participate in the care of this patient. Please call pager [phone] / phone (7AM- 3PM) [phone] for any questions/concerns.      Author:  Tye Savoy. Leavy Cella, MD 06/25/2019 10:54 AM

## 2019-06-25 NOTE — Nursing Note
@  03:00 Took over patient care from [place], [place]  @06 :00 Pain level 8/10, sleepy, instructed on pain mgt, use of pca, oxygen 4 liters via n/c, 02 saturation 97%, no nausea,  iv fluids  infusing at 75 ml/hr,  right jp drain to bulb suction - 10 ml output, left jp drain to bulb suction, 5 ml output, foley output 2300 ml yellow urine, abdominal incision-steri strips dry/intact ,fall precautions in place, bed alarm on, call light within reach

## 2019-06-25 NOTE — Other
Patients Clinical Goal: ''Find out when I can eat something''  Clinical Goal(s) for the Shift: VSS, cardiac monitoring, o2 monitoring, safety, sleep hygiene, safety, rest  Identify possible barriers to advancing the care plan:   Stability of the patient: Moderately Unstable - medium risk of patient condition declining or worsening    End of Shift Summary:  Pt A/Ox4, on cardiac monitoring showing SR to ST in the low 100s. Pt on 4L on continuous pulse ox on nasal cannula. PIV patent, infusing TKO and PCA dilaudid on R arm and IVF on L arm. Dressing over R JP CDI and L JP showing previous drainage. Incision sites on abd closed to steri strips. Pt voiding clear yellow urine via foley. Paged MD to dc foley since PCEA was pulled out by pt on AM shift. Per Surgery team, will place order to dc foley upon assessment this morning. Paged anesthesia team regarding pt removing PCEA partially, per MD will remove line this AM, MD ok w/ PCEA capped, placed new tegaderm over site. Last BM 06/20/2019, passing gas. Tolerating 50ccs of ice chips hourly. BMAT 3, ambulates w/ a walker. Safety precautions maintained. All pt needs attended to. Call light within reach. Plan of care for PCEA line removal today and ambulation. Endorsed care to RN Dita at 0300.

## 2019-06-25 NOTE — Other
Patients Clinical Goal:   Clinical Goal(s) for the Shift: VSS, Safety/Fall Prevention, Pain Mgmt, Comfort/Rest  Identify possible barriers to advancing the care plan: None.  Stability of the patient: Moderately Stable - low risk of patient condition declining or worsening   End of Shift Summary:      Pt AOx4, calm and cooperative, resting in bed with no signs of distress.    PIV CDI, infusing IVF at 33mL/hr.   Pt ambulating with steady gait, stand by assist. Ambulated to bathroom and then around unit.   Epidural removed by MD Luciana Axe in am.   PCA Hydromorphone in place for pain mgmt. Settings 0 basal, 0.4 bolus, interval of 20 minutes, max of 3x/hr.   Foley catheter dc'd. Pt voiding well.   Tele monitoring, NSR. ST with activity.  Pulse ox monitoring, 02 Sat WNL on 4L supplemental 02.  2 JPs to L and R abdomen, draining minimal serosanguinous output. Lap sites with steristrips CDI. Skin intact other than sx sites.  VSS BP 94/63  ~ Pulse 92  ~ Temp 37.1 C (98.8 F) (Oral)  ~ Resp 18  ~ Ht 1.626 m (5' 4'') Comment: on 06/02/2019 ~ SpO2 98%  ~ BMI 24.22 kg/m   Call light in reach, will endorse care to oncoming RN.

## 2019-06-25 NOTE — Progress Notes
INPATIENT H&P / GENERAL SURGERY / [place] [facility] [phone]    PATIENT:  Donna Gross  MRN:  [id number]  DOB:  Apr 22, 1966  DATE OF SERVICE:  06/25/2019    GENERAL SURGERY ATTENDING PHYSICIAN:  Dr. [name]  PRIMARY CARE PHYSICIAN:  [facility], MD    HPI:  Donna Gross is a 53 y.o. female with complex peptic ulcer history who presented to ED with 3 days of N/V and abdominal pain on 06/20/19. She was admitted the night before scheduled surgery. She is now POD4 s/p scheduled laparoscopic vagotomy and antrectomy, gastrojejunostomy, and hiatal hernia repair (06/21/19).     Interval Events/Subjective:    06/21/19: Pt with nausea but no emesis overnight.  Has c/o of constant abdominal pain. Pt has been NPO for surgery.  06/22/19: NGT left in place after surgery. Patient enduring moderate post-operative pain. Was evaluated without evidence of acute pathology, bleed, or infection. Started on Dilaudid prn for post-operative pain (has chronic pain and moderate opioid requirements at home) in addition to existing PCEA. Patient continues to have pain this morning that is under improved control. Denies nausea or vomiting.  06/23/19: Required 500cc NS bolus x 2 yesterday for rising Cr and SBPs to low 100s. Also started IV tylenol and Dilaudid PCA for pain control. Pt reports pain well controlled this morning. Denies N/V, fevers, or chills. Requiring nasal cannula to maintain O2 sats>92%, but pt denies SOB, respiratory distress, or chest pain.  06/24/19: No acute events. Pulled NGT this morning herself confusing it for nasal cannula. NGT not replaced. Denies passing gas or having BM. O2 requirements decreasing, on 1L NC as of this AM. Complained of itching yesterdayrequiring IV Benadryl x 2. Denies fevers, chills, N/V, or new/worsening abdominal pain other than incisional tenderness.  06/25/19: No acute events. Denies N/V. Denies BM or flatus. Reports abdominal pain remains severe but is improving overall. Reports sleeping for 2 hrs at a time overnight, waking in 10/10 pain, and pushing pain button. Reports bilateral shoulder and calf pruritis. Reports extremely painful bump on midline upper back. Reports new onset numbness on back of L hand beginning this morning, located adjacent to sites of antibiotic injections. Reports feeling hungry and is enthusiastic about returning to eating.    Temp:  [36.7 ?C (98 ?F)-37.3 ?C (99.2 ?F)] 37.3 ?C (99.2 ?F)  Heart Rate:  [80-98] 90  Resp:  [16-20] 18  BP: (114-139)/(72-91) 135/72  NBP Mean:  [87-104] 92  SpO2:  [92 %-96 %] 94 %       I/O last 3 completed shifts:  In: 3216.5 [P.O.:300; I.V.:2796.5; Other:20; IV Piggyback:100]  Out: 5399 [Urine:5050; Emesis/GI output:250; Drains:99]    Diets/Supplements/Feeds   Diet    Diet NPO Except for: Ice Chips and Meds, Sips     Start Date/Time: 06/24/19 1440      Number of Occurrences: Until Specified       Scheduled Meds:  ? enoxaparin  40 mg Subcutaneous QHS   ? mineral oil-hydrophilic petrolatum   Topical 6x Daily   ? pantoprazole  40 mg IV Push Q24H   ? piperacillin/tazobactam  3.375 g Intravenous Q8H   ? QUEtiapine  100 mg Oral BID       PRN Meds:  albuterol, HYDROmorphone **AND** sodium chloride **AND** naloxone, ondansetron    Exam:  Current BP 135/72  ~ Pulse 90  ~ Temp 37.3 ?C (99.2 ?F) (Axillary)  ~ Resp 18  ~ Ht 5' 4'' (1.626 m) Comment: on 06/02/2019 ~  SpO2 94%  ~ BMI 24.22 kg/m?   Gen: NAD  HEENT: NCAT, EOMI, MMM, neck supple. No NGT in place.   CV: RRR, nl s1 s2  Pulm: Normal respiratory excursions, not tachypneic, no labored breathing.   Abd: Soft, non-distended. Multiple prior incisions, feeding tube sites, drain sites. Has mild expected incisional tenderness, and diffuse abdominal tenderness. Incisions clean/dry/intact with dressings in place. Has two abdominal JP drains with scant serosanguineous output.  GU: Foley catheter in place draining clear yellow urine.  Ext: warm, well perfused, no cyanosis, clubbing, or edema. Raised, extremely tender, non-erythematous 2cm diameter bump with central scab on upper midline back at site of previous attempted epidural placement. No rash or excoriations on shoulders. Dry skin and non-bleeding excoriations on bilateral calves. Numbness on back of L hand, between MCP joint line and wrist, extending full width of hand. Moving legs well bilaterally.  Neuro: A&Ox3  Drains: abdominal JPx2. Both with scant serosanguineous output.  Output by Drain (mL) 06/23/19 0701 - 06/23/19 1900 06/23/19 1901 - 06/24/19 0700 06/24/19 0701 - 06/24/19 1900 06/24/19 1901 - 06/25/19 0700 06/25/19 0701 - 06/25/19 1020   Surgical Drain 1 Right Abdomen JP 25 19 20 15     Surgical Drain 2 Left Abdomen JP 20 15 20 10          Labs:  Recent Labs     06/25/19  1610 06/24/19  0524 06/23/19  0603   HGB 8.8* 8.3* 8.3*   HCT 28.2* 26.6* 26.7*   PLT 355 325 322     No results for input(s): APTT, PT, INR in the last 72 hours.    Recent Labs     06/25/19  0639 06/24/19  0524 06/23/19  0603   CREAT 0.86 0.84 1.06   BUN 4* 7 14   NA 141 135 137   K 4.6 4.1 4.6   CL 101 100 104   CO2 28 26 20    GLUCOSE 84 84 92   CALCIUM 9.3 8.9 8.4*     Recent Labs     06/25/19  0639 06/24/19  0524 06/23/19  0603   MG 1.4 1.3* 1.8   PHOS 3.2 2.7 2.4     Recent Labs     06/25/19  0639 06/24/19  0524 06/23/19  0603   GLUCOSE 84 84 92       No results for input(s): TOTPRO, ALBUMIN in the last 72 hours.  No results for input(s): PREALBUMIN in the last 72 hours.   No results for input(s): BILITOT, BILICON, AST, ALT, ALKPHOS, LDH in the last 72 hours.  No results for input(s): INR in the last 72 hours.  No results for input(s): AMYLASE, LIPASE in the last 72 hours.    Micro:   Recent Labs     06/25/19  0639 06/24/19  0524 06/23/19  0603   WBC 9.80 13.70* 14.21*       Recent Results (from the past 336 hour(s))   COVID-19 PCR, Nasopharyngeal    Collection Time: 06/19/19  3:47 PM   Result Value Ref Range    Specimen Type Respiratory, Upper COVID-19 PCR Not Detected Not Detected     Imaging:  Xr Chest Ap Portable (1 View)    Result Date: 06/24/2019  IMPRESSION:  Epidural catheter is present. NG/OG is removed. Upper abdominal surgical drains. Allowing for low lung volume the cardiomediastinal silhouette is stable. Mild interstitial pulmonary edema and vascular congestion. Interval increase in small left more than right bilateral pleural  effusions with associated atelectasis/consolidation. Decrease in left lateral chest wall subcutaneous emphysema. Signed by: Floy Sabina   06/24/2019 11:42 AM    Xr Kub Portable (1 View)    Result Date: 06/20/2019  IMPRESSION: Moderate stool burden. Normal bowel gas pattern. No intraperitoneal free air. No acute osseous abnormality. I, Quentin Cornwall, M.D., have reviewed this radiological study personally and I am in full agreement with the findings of the report presented here. Dictated by: Carolina Cellar   06/20/2019 4:12 PM Signed by: Quentin Cornwall   06/20/2019 4:31 PM    Xr Chest For Feeding Tube Placement (1 View)    Result Date: 06/22/2019  IMPRESSION: Epidural catheter is present. An enteric tube coursing below diaphragm with the tip projecting over mid gastric body. Upper abdominal surgical drains. Allowing for low lung volume the cardiomediastinal silhouette is stable. Mild interstitial pulmonary edema and vascular congestion. Left chest and abdominal wall moderate subcutaneous emphysema. Signed by: Benjamine Sprague   06/22/2019 9:12 AM    Assessment/Plan:  Donna Gross is a 53 y.o. female with multiple prior surgeries that stemmed from recurrent peptic ulcer disease for which she now presents. She is now 4 Days Post-Op s/p scheduled laparoscopic vagotomy and antrectomy, gastrojejunostomy, and hiatal hernia repair (06/21/19) c/b aspiration during induction of anesthesia for which patient is on empiric IV abx. Pts O2 requirements have continued to decrease in post-operative setting, on 1L NC as of this AM. CXR (06/24/19) shows mild pleural effusions with atelectasis as well as pulmonary congestion, but no focal consolidations to suggest pneumonia/pneumonitis. Mild post-op AKI resolved. Patient is otherwise recovering well without acute issues.      #Neuro  - Pain Management: IV Tylenol 1000mg  q8h, Fentanyl PCEA, Dilaudid PCA  - Acute Pain consulted, appreciate recs  - Activity as tolerated    #Pulmonary   - Pulm Toileting: Incentive spirometer   - CXR done to evaluate shortness of breath    #GI  - Bowel regimen: Sips of clears as tolerated   - Ulcer ppx: Protonix 40mg  IV QDay  - Nutrition orders: Diet NPO Except for: Ice Chips and Meds, Sips, maintain IVF  - Daily BMP   - Replete electrolytes prn    #GU:   - AKI resolved in response to NS bolus x2, likely pre-renal from peri-operative underresuscitation  - Foley catheter in place    #ID/ Heme: Pt had large aspiration event during induction of anesthesia prior to surgery. She is being treated with Zosyn empirically to prevent pneumonitis/pneumonia.  - Daily CBC  - ABX: Zosyn IV x 5 days (7/1-7/6)    #Ext:  - monitor raised, painful bump on midline upper back  - give Aquaphor for pruritic zones on shoulders and calves  - monitor numbness on back of L hand    #PPx   - SCDs  - OOB to chair today and walking as tolerated    #Disposition  - D/C home when tolerating regular diet and pain is controlled on PO pain regimen.  - No home health needs anticipated.  - Please page [phone] (surgery, [place] team) with any questions.  - Code Status: Prior    Patient seen with General Surgery Team and plan of care discussed with Attending, Alonna Buckler., MD, who is in agreement.     AuthorClaris Gower Anayla Giannetti  10:20 AM 06/25/2019  Division of General Surgery  [facility]

## 2019-06-26 LAB — Magnesium: MAGNESIUM: 1.2 meq/L — ABNORMAL LOW (ref 1.4–1.9)

## 2019-06-26 LAB — Basic Metabolic Panel
ANION GAP: 6 mmol/L — ABNORMAL LOW (ref 8–19)
GLUCOSE: 86 mg/dL (ref 65–99)

## 2019-06-26 LAB — CBC: MEAN CORPUSCULAR HEMOGLOBIN: 28.4 pg (ref 26.4–33.4)

## 2019-06-26 LAB — Phosphorus: PHOSPHORUS: 3.9 mg/dL (ref 2.3–4.4)

## 2019-06-26 LAB — Differential Automated: LYMPHOCYTE PERCENT, AUTO: 19.5 (ref 0.00–0.50)

## 2019-06-26 MED ADMIN — AQUAPHOR EX OINT: TOPICAL | @ 04:00:00 | Stop: 2019-07-03

## 2019-06-26 MED ADMIN — AQUAPHOR EX OINT: TOPICAL | @ 23:00:00 | Stop: 2019-07-03

## 2019-06-26 MED ADMIN — AQUAPHOR EX OINT: TOPICAL | @ 13:00:00 | Stop: 2019-07-03

## 2019-06-26 MED ADMIN — AQUAPHOR EX OINT: TOPICAL | @ 15:00:00 | Stop: 2019-07-03

## 2019-06-26 MED ADMIN — AQUAPHOR EX OINT: TOPICAL | @ 19:00:00 | Stop: 2019-07-03

## 2019-06-26 MED ADMIN — PIPERACILLIN-TAZOBACTAM 3.375 GM/50 ML RTU (EXT 4HR): 3.375 g | INTRAVENOUS | Stop: 2019-06-27 | NDC 00206886102

## 2019-06-26 MED ADMIN — QUETIAPINE FUMARATE 100 MG PO TABS: 100 mg | ORAL | @ 15:00:00 | Stop: 2019-07-03

## 2019-06-26 MED ADMIN — HYDROMORPHONE 10 MG/50 ML PCA SYRINGE (503B): 50 mL | INTRAVENOUS | @ 18:00:00 | Stop: 2019-06-27 | NDC 70092111450

## 2019-06-26 MED ADMIN — PIPERACILLIN-TAZOBACTAM 3.375 GM/50 ML RTU (EXT 4HR): 3.375 g | INTRAVENOUS | @ 07:00:00 | Stop: 2019-06-27 | NDC 00206886102

## 2019-06-26 MED ADMIN — MAGNESIUM SULFATE 5-6 GM IVPB: 6 g | INTRAVENOUS | @ 13:00:00 | Stop: 2019-06-26 | NDC 63323006411

## 2019-06-26 MED ADMIN — PIPERACILLIN-TAZOBACTAM 3.375 GM/50 ML RTU (EXT 4HR): 3.375 g | INTRAVENOUS | @ 15:00:00 | Stop: 2019-06-27 | NDC 00206886102

## 2019-06-26 MED ADMIN — QUETIAPINE FUMARATE 100 MG PO TABS: 100 mg | ORAL | @ 04:00:00 | Stop: 2019-07-03 | NDC 00904664061

## 2019-06-26 MED ADMIN — DEXTROSE 5 %/LACTATED RINGERS: 75 mL/h | INTRAVENOUS | @ 14:00:00 | Stop: 2019-06-26 | NDC 00338012504

## 2019-06-26 MED ADMIN — AQUAPHOR EX OINT: TOPICAL | Stop: 2019-07-03

## 2019-06-26 MED ADMIN — QUETIAPINE FUMARATE 100 MG PO TABS: 100 mg | ORAL | @ 04:00:00 | Stop: 2019-07-03

## 2019-06-26 MED ADMIN — HYDROMORPHONE 10 MG/50 ML PCA SYRINGE (503B): 50 mL | INTRAVENOUS | @ 07:00:00 | Stop: 2019-06-27 | NDC 70092111450

## 2019-06-26 MED ADMIN — ENOXAPARIN SODIUM 40 MG/0.4ML SC SOLN: 40 mg | SUBCUTANEOUS | @ 04:00:00 | Stop: 2019-06-29 | NDC 60505079204

## 2019-06-26 MED ADMIN — DEXTROSE 5 %/LACTATED RINGERS: 75 mL/h | INTRAVENOUS | Stop: 2019-06-26 | NDC 00338012504

## 2019-06-26 MED ADMIN — PANTOPRAZOLE SODIUM 40 MG IV SOLR: 40 mg | INTRAVENOUS | @ 04:00:00 | Stop: 2019-06-28 | NDC 00143928410

## 2019-06-26 NOTE — Progress Notes
06/26/19 1103   Time Calculation   Start Time 1103   Doc Time (min) 12 min   Patient not seen due to Other (Comment)       Attempted to see pt for PT treatment, pt received in bed, daughter at bedside.  Pt requested to defer PT as daughter with limited time to visit.  Plan to follow up later today as able.  Recommend pt be OOB to chair daily and up ambulating in hallway with RN staff TID.

## 2019-06-26 NOTE — Other
Patients Clinical Goal:   Clinical Goal(s) for the Shift: VSS, Safety/Fall Prevention, Pain Mgmt, Comfort/Rest  Identify possible barriers to advancing the care plan: None.  Stability of the patient: Moderately Stable - low risk of patient condition declining or worsening   End of Shift Summary:      Pt AOx4, calm and cooperative, resting in bed with no signs of distress.    PIV CDI, infusing PCA for pain mgmt. Settings 0 basal, 0.4 bolus, interval of 20 minutes, max of 3x/hr.   Pt ambulating with steady gait, stand by assist. Ambulated around unit with PT.   Pt voiding well.   Tele monitoring dc'd. Pulse ox monitoring, 02 Sat WNL on 2L supplemental 02.   2 JPs to L and R abdomen, draining minimal serosanguinous output. Lap sites with steristrips CDI.   Pt advanced to clear liquid diet, tolerating well, no N/V.   VSS BP 143/98  ~ Pulse 89  ~ Temp 37.1 C (98.8 F) (Oral)  ~ Resp 16  ~ Ht 1.626 m (5' 4'') Comment: on 06/02/2019 ~ SpO2 100%  ~ BMI 24.22 kg/m    Call light in reach, will endorse care to oncoming RN.

## 2019-06-26 NOTE — Progress Notes
INPATIENT H&P / GENERAL SURGERY / [facility] [phone]    PATIENT:  Donna Gross  MRN:  [id number]  DOB:  Dec 21, 1966  DATE OF SERVICE:  06/26/2019    GENERAL SURGERY ATTENDING PHYSICIAN:  Dr. [name]  PRIMARY CARE PHYSICIAN:  [facility], MD    HPI:  Vincenzina Jagoda is a 53 y.o. female with complex peptic ulcer history who presented to ED with 3 days of N/V and abdominal pain on 06/20/19. She was admitted the night before scheduled surgery. She is now POD4 s/p scheduled laparoscopic vagotomy and antrectomy, gastrojejunostomy, and hiatal hernia repair (06/21/19).     Interval Events/Subjective:    06/21/19: Pt with nausea but no emesis overnight.  Has c/o of constant abdominal pain. Pt has been NPO for surgery.  06/22/19: NGT left in place after surgery. Patient enduring moderate post-operative pain. Was evaluated without evidence of acute pathology, bleed, or infection. Started on Dilaudid prn for post-operative pain (has chronic pain and moderate opioid requirements at home) in addition to existing PCEA. Patient continues to have pain this morning that is under improved control. Denies nausea or vomiting.  06/23/19: Required 500cc NS bolus x 2 yesterday for rising Cr and SBPs to low 100s. Also started IV tylenol and Dilaudid PCA for pain control. Pt reports pain well controlled this morning. Denies N/V, fevers, or chills. Requiring nasal cannula to maintain O2 sats>92%, but pt denies SOB, respiratory distress, or chest pain.  06/24/19: No acute events. Pulled NGT this morning herself confusing it for nasal cannula. NGT not replaced. Denies passing gas or having BM. O2 requirements decreasing, on 1L NC as of this AM. Complained of itching yesterdayrequiring IV Benadryl x 2. Denies fevers, chills, N/V, or new/worsening abdominal pain other than incisional tenderness.  06/25/19: No acute events. Denies N/V. Denies BM or flatus. Reports abdominal pain remains severe but is improving overall and well controlled with current PCA. Reports sleeping for 2 hrs at a time overnight, waking in 10/10 pain, using PCA with relief. Reports bilateral shoulder and calf pruritis. Reports extremely painful bump on midline upper back. Reports new onset numbness on back of L hand beginning this morning, located around area of IV trials. Reports feeling hungry and is enthusiastic about returning to eating.  06/26/19: No acute events. Denies N/V. Tolerated 500cc/shift clears yesterday without issue. Now passing gas, up and walking, no bowel movements. Continues to have abdominal pain, improved overall. Remains on Dilaudid PCA.    Temp:  [36.2 ???C (97.2 ???F)-37.1 ???C (98.8 ???F)] 37.1 ???C (98.8 ???F)  Heart Rate:  [90-95] 93  Resp:  [16-18] 16  BP: (94-131)/(63-89) 131/82  NBP Mean:  [73-102] 98  SpO2:  [94 %-98 %] 98 %       I/O last 3 completed shifts:  In: 2848.6 [P.O.:950; I.V.:1898.6]  Out: 4520.5 [Urine:4475; Drains:45.5]    Diets/Supplements/Feeds   Diet    Diet clear liquid     Start Date/Time: 06/26/19 0820      Number of Occurrences: Until Specified       Scheduled Meds:  ??? enoxaparin  40 mg Subcutaneous QHS   ??? mineral oil-hydrophilic petrolatum   Topical 6x Daily   ??? pantoprazole  40 mg IV Push Q24H   ??? piperacillin/tazobactam  3.375 g Intravenous Q8H   ??? QUEtiapine  100 mg Oral BID       PRN Meds:  albuterol, HYDROmorphone **AND** sodium chloride **AND** naloxone, ondansetron    Exam:  Current BP 131/82  ~  Pulse 93  ~ Temp 37.1 ???C (98.8 ???F) (Oral)  ~ Resp 16  ~ Ht 1.626 m (5' 4'') Comment: on 06/02/2019 ~ SpO2 98%  ~ BMI 24.22 kg/m???   Gen: NAD  HEENT: NCAT, EOMI, MMM, neck supple. No NGT in place.   CV: RRR, nl s1 s2  Pulm: Normal respiratory excursions, not tachypneic, no labored breathing.   Abd: Soft, non-distended. Multiple prior incisions, feeding tube sites, drain sites. Has mild expected incisional tenderness, and diffuse mild abdominal tenderness. Incisions clean/dry/intact with dressings in place. Has two abdominal JP drains with scant serosanguineous output.  GU: Voiding spontaneously  Ext: warm, well perfused, no cyanosis, clubbing, or edema. Raised, extremely tender, non-erythematous 2cm diameter bump with central scab on upper midline back at site of previous attempted epidural placement. No rash or excoriations on shoulders. Dry skin and non-bleeding excoriations on bilateral calves. Numbness on back of L hand, between MCP joint line and wrist, extending full width of hand. Moving legs well bilaterally.  Neuro: A&Ox3  Drains: abdominal JPx2. Both with scant serosanguineous output.  Output by Drain (mL) 06/24/19 0701 - 06/24/19 1900 06/24/19 1901 - 06/25/19 0700 06/25/19 0701 - 06/25/19 1900 06/25/19 1901 - 06/26/19 0700 06/26/19 0701 - 06/26/19 1549   Surgical Drain 1 Right Abdomen JP 20 15 5  7.5    Surgical Drain 2 Left Abdomen JP 20 10 3 5          Labs:  Recent Labs     06/26/19  0247 06/25/19  0639 06/24/19  0524   HGB 7.8* 8.8* 8.3*   HCT 24.8* 28.2* 26.6*   PLT 365 355 325     No results for input(s): APTT, PT, INR in the last 72 hours.    Recent Labs     06/26/19  0247 06/25/19  0639 06/24/19  0524   CREAT 0.81 0.86 0.84   BUN 5* 4* 7   NA 137 141 135   K 3.7 4.6 4.1   CL 100 101 100   CO2 31* 28 26   GLUCOSE 86 84 84   CALCIUM 8.5* 9.3 8.9     Recent Labs     06/26/19  0247 06/25/19  0639 06/24/19  0524   MG 1.2* 1.4 1.3*   PHOS 3.9 3.2 2.7     Recent Labs     06/26/19  0247 06/25/19  0639 06/24/19  0524   GLUCOSE 86 84 84       No results for input(s): TOTPRO, ALBUMIN in the last 72 hours.  No results for input(s): PREALBUMIN in the last 72 hours.   No results for input(s): BILITOT, BILICON, AST, ALT, ALKPHOS, LDH in the last 72 hours.  No results for input(s): INR in the last 72 hours.  No results for input(s): AMYLASE, LIPASE in the last 72 hours.    Micro:   Recent Labs     06/26/19  0247 06/25/19  0639 06/24/19  0524   WBC 9.25 9.80 13.70*       Recent Results (from the past 336 hour(s)) COVID-19 PCR, Nasopharyngeal    Collection Time: 06/19/19  3:47 PM   Result Value Ref Range    Specimen Type Respiratory, Upper     COVID-19 PCR Not Detected Not Detected     Imaging:  Xr Chest Ap Portable (1 View)    Result Date: 06/24/2019  IMPRESSION:  Epidural catheter is present. NG/OG is removed. Upper abdominal surgical drains. Allowing for low lung volume  the cardiomediastinal silhouette is stable. Mild interstitial pulmonary edema and vascular congestion. Interval increase in small left more than right bilateral pleural effusions with associated atelectasis/consolidation. Decrease in left lateral chest wall subcutaneous emphysema. Signed by: Floy Sabina   06/24/2019 11:42 AM    Xr Kub Portable (1 View)    Result Date: 06/20/2019  IMPRESSION: Moderate stool burden. Normal bowel gas pattern. No intraperitoneal free air. No acute osseous abnormality. I, Quentin Cornwall, M.D., have reviewed this radiological study personally and I am in full agreement with the findings of the report presented here. Dictated by: Carolina Cellar   06/20/2019 4:12 PM Signed by: Quentin Cornwall   06/20/2019 4:31 PM    Xr Chest For Feeding Tube Placement (1 View)    Result Date: 06/22/2019  IMPRESSION: Epidural catheter is present. An enteric tube coursing below diaphragm with the tip projecting over mid gastric body. Upper abdominal surgical drains. Allowing for low lung volume the cardiomediastinal silhouette is stable. Mild interstitial pulmonary edema and vascular congestion. Left chest and abdominal wall moderate subcutaneous emphysema. Signed by: Benjamine Sprague   06/22/2019 9:12 AM    Assessment/Plan:  Ayat Drenning is a 53 y.o. female with multiple prior surgeries that stemmed from recurrent peptic ulcer disease for which she now presents. She is now 5 Days Post-Op s/p scheduled laparoscopic vagotomy and antrectomy, gastrojejunostomy, and hiatal hernia repair (06/21/19) c/b aspiration during induction of anesthesia for which patient is on empiric IV abx. Pts O2 requirements have continued to decrease in post-operative setting. CXR (06/24/19) shows mild pleural effusions with atelectasis as well as pulmonary congestion, but no focal consolidations or infiltrates to suggest pneumonia/pneumonitis. Mild post-op AKI resolved. Patient is otherwise recovering well without acute issues.     #Neuro  - Pain Management: IV Tylenol 1000mg  q8h, Dilaudid PCA (0.4 max q20 min)  - Acute Pain consulted, appreciate recs  -- removal of fentanyl PCEA by patient, acute pain to remove catheter today  - Activity as tolerated    #Pulmonary   - Pulm Toileting: Incentive spirometer     #GI  - Bowel regimen: Sips of clears as tolerated   - Ulcer ppx: Protonix 40mg  IV QDay  - Nutrition orders: Diet clear liquid  - Daily BMP   - Replete electrolytes prn    #GU:   - AKI resolved in response to NS bolus x2, likely pre-renal from peri-operative underresuscitation  - Foley catheter in place- removed today    #ID/ Heme: Pt had large aspiration event during induction of anesthesia prior to surgery. She is being treated with Zosyn empirically to prevent pneumonitis/pneumonia.  - Daily CBC  - ABX: Zosyn IV x 5 days (7/1-7/6)    #Ext:  - monitor raised, painful bump on midline upper back  - give Aquaphor for pruritic zones on shoulders and calves  - monitor numbness on back of L hand  - zofran PRN itching as regular Nalbuphine for itching is not available due to shortage    #PPx   - SCDs  - OOB to chair today and walking as tolerated    #Disposition  - D/C home when tolerating regular diet and pain is controlled on PO pain regimen.  - No home health needs anticipated.  - Please page [phone] (surgery, [place] [facility]) with any questions.  - Code Status: Full Code    Patient seen with General Surgery Team and plan of care discussed with Attending, Alonna Buckler., MD, who is in agreement.     Author:  Remi Deter, MD 3:49 PM 06/26/2019  Division of General Surgery  [facility]

## 2019-06-26 NOTE — Progress Notes
Physical Therapy Treatment #2      PATIENT: Donna Gross  MRN: [id number]    Treatment Date: 06/26/2019    Patient Presentation: In bed;IV;PCA;Oxygen;Pulse ox;Bed alarm on;Drain(JP drain x2 abdomen)    Pertinent Updates: PCEA removed this morning; Labs today: H/H 7.8/24.8    Precautions   Precautions: Fall risk;Monitor Vitals;Check Labs  Orthotic: None  Current Activity Order: Activity as tolerated  Weight Bearing Status: Not Applicable  Additional Weight Bearing Status: Not Applicable    Cognition   Cognition: Within Defined Limits  Safety Awareness: Good awareness of safety precautions  Barriers to Learning: None    Bed Mobility   Supine Scooting: Not Performed  Rolling: Independent;to Right;Side Rails;Verbal Cueing  Supine to Sit: Stand by Assist  Sit to Supine: Not Performed(2/2 pt left sitting up in chair post- PT)    Functional Mobility   Sit to Stand: Stand by Assist  Ambulation: Stand by Assist  Ambulation Distance (Feet): >32feet  Gait Pattern: Decreased stride length;Decreased arm swing;Decreased pace;Decreased heel-toe  Assistive Device: None  Wheelchair: Not Performed  Stairs: Not Performed;Unable(2/2 multiple lines including PCA and O2)   Pain Assessment   Patient complains of pain: Yes  Pain Quality: Aching  Pain Scale Used: Numeric Pain Scale  Pain Intensity: 8/10  Pain Location: Abdomen;Incision  Action Taken: Pain mgmt education;Positioning                        Patient Status   Activity Tolerance: Good  Oxygen Needs: Nasal cannula  Flow (& FiO2): 4L  Response to Treatment: Tolerated treatment well(O2 sats 93-96% on 4LNC post- ambulation)  Compliance with Precautions: Good  Call light in reach: Yes  Presentation post treatment: Up in chair;On oxygen;Lines/drains intact;Pulse Ox  Comments: Pt agreeable for therapy on second attempt, demonstrating much improved endurance and overall activity tolerance.  Pt ambulating well without AD, slow but steady gait.  Recommend pt be OOB to chair with assist for all meals and up ambulating in hallway with RN staff TID.  Discussed practicing stairs at next session, pt reporting not concerned with stairs and will have help upon dc.  Pt reports that she feels she is moving well, more concerned about lowering O2 needs and pain med doses.    Interdisciplinary Communication   Interdisciplinary Communication: Nurse([place])    Treatment Plan   Continue PT Treatment Plan with Focus on: Gait training;Stair training;Therapeutic exercise    PT Recommendations   Discharge Recommendation: Physical Therapy;1-3 x/week  Type of therapy: Home Health  Supervision Recommended on Discharge: 12-24 hrs/day;Initially, wean supervision as tolerated  Discharge Equipment Recommended: None  Equipment ordered: Not applicable    Treatment Completed by: Rise Mu, PT

## 2019-06-26 NOTE — Other
Patients Clinical Goal:   Clinical Goal(s) for the Shift: pain control and comfort  Identify possible barriers to advancing the care plan:   Stability of the patient: Moderately Unstable - medium risk of patient condition declining or worsening    End of Shift Summary: pt remained alert and oriented for the rest of this shift. VSS, NSR/ST on the monitor. Respirations are even and unlabored, on 4LNC sating at 94%. Pain well controlled by pca dilaudid with settings of 0.4/20/3. IV site is CDI and patent. IV abx infusing well. NPO with sips of 500cc last night; tolerating well. Voiding w/o difficulty; last BM 06/25/2019 and passing gas. JP drains in place with serosang output. BmAT 4 with standby assistance. Lap sites are CDI, no acute events occurred overnight; pt remained stable. Call light within reach, will endorse plan of care to the upcoming shift.    BP 109/76  ~ Pulse 90  ~ Temp 36.2 C (97.2 F) (Oral)  ~ Resp 16  ~ Ht 1.626 m (5' 4'') Comment: on 06/02/2019 ~ SpO2 98%  ~ BMI 24.22 kg/m

## 2019-06-27 LAB — Magnesium: MAGNESIUM: 1.6 meq/L (ref 1.4–1.9)

## 2019-06-27 LAB — Basic Metabolic Panel
ANION GAP: 13 mmol/L (ref 8–19)
POTASSIUM: 3.5 mmol/L — ABNORMAL LOW (ref 3.6–5.3)

## 2019-06-27 LAB — Differential Automated: IMMATURE GRANULOCYTES%: 0.5 (ref 1.30–3.40)

## 2019-06-27 LAB — CBC: WHITE BLOOD CELL COUNT: 7.86 10*3/uL (ref 4.16–9.95)

## 2019-06-27 LAB — Phosphorus: PHOSPHORUS: 3.2 mg/dL (ref 2.3–4.4)

## 2019-06-27 MED ADMIN — HYDROMORPHONE HCL 1 MG/ML IJ SOLN: .2 mg | INTRAVENOUS | @ 20:00:00 | Stop: 2019-06-28 | NDC 00409128331

## 2019-06-27 MED ADMIN — POTASSIUM CHLORIDE ER 8 MEQ PO TBCR: 8 meq | ORAL | @ 20:00:00 | Stop: 2019-06-27 | NDC 00245531501

## 2019-06-27 MED ADMIN — HYDROMORPHONE 10 MG/50 ML PCA SYRINGE (503B): 50 mL | INTRAVENOUS | @ 12:00:00 | Stop: 2019-06-27 | NDC 70092111450

## 2019-06-27 MED ADMIN — AQUAPHOR EX OINT: TOPICAL | @ 03:00:00 | Stop: 2019-07-03

## 2019-06-27 MED ADMIN — AQUAPHOR EX OINT: TOPICAL | @ 16:00:00 | Stop: 2019-07-03

## 2019-06-27 MED ADMIN — AQUAPHOR EX OINT: TOPICAL | @ 12:00:00 | Stop: 2019-07-03

## 2019-06-27 MED ADMIN — CYCLOBENZAPRINE HCL 5 MG PO TABS: 5 mg | ORAL | @ 20:00:00 | Stop: 2019-06-28 | NDC 68084075325

## 2019-06-27 MED ADMIN — QUETIAPINE FUMARATE 100 MG PO TABS: 100 mg | ORAL | @ 04:00:00 | Stop: 2019-07-03 | NDC 00904664061

## 2019-06-27 MED ADMIN — QUETIAPINE FUMARATE 100 MG PO TABS: 100 mg | ORAL | @ 03:00:00 | Stop: 2019-07-03

## 2019-06-27 MED ADMIN — OXYCODONE HCL 5 MG PO TABS: 15 mg | ORAL | @ 23:00:00 | Stop: 2019-06-27 | NDC 00406055262

## 2019-06-27 MED ADMIN — QUETIAPINE FUMARATE 100 MG PO TABS: 100 mg | ORAL | @ 16:00:00 | Stop: 2019-07-03

## 2019-06-27 MED ADMIN — ACETAMINOPHEN 325 MG PO TABS: 650 mg | ORAL | @ 20:00:00 | Stop: 2019-06-28 | NDC 00904677361

## 2019-06-27 MED ADMIN — AQUAPHOR EX OINT: TOPICAL | Stop: 2019-07-03

## 2019-06-27 MED ADMIN — AQUAPHOR EX OINT: TOPICAL | @ 22:00:00 | Stop: 2019-07-03

## 2019-06-27 MED ADMIN — ACETAMINOPHEN 325 MG PO TABS: 650 mg | ORAL | @ 13:00:00 | Stop: 2019-06-28

## 2019-06-27 MED ADMIN — HYDROMORPHONE HCL 1 MG/ML IJ SOLN: .2 mg | INTRAVENOUS | @ 22:00:00 | Stop: 2019-06-28 | NDC 00409128331

## 2019-06-27 MED ADMIN — PANTOPRAZOLE SODIUM 40 MG IV SOLR: 40 mg | INTRAVENOUS | @ 03:00:00 | Stop: 2019-06-28

## 2019-06-27 MED ADMIN — ENOXAPARIN SODIUM 40 MG/0.4ML SC SOLN: 40 mg | SUBCUTANEOUS | @ 03:00:00 | Stop: 2019-06-29 | NDC 60505079204

## 2019-06-27 MED ADMIN — OXYCODONE HCL 5 MG PO TABS: 15 mg | ORAL | @ 18:00:00 | Stop: 2019-06-27 | NDC 00406055262

## 2019-06-27 NOTE — Other
Patients Clinical Goal:   Clinical Goal(s) for the Shift: pain control and comfort  Identify possible barriers to advancing the care plan:   Stability of the patient: Moderately Stable - low risk of patient condition declining or worsening   End of Shift Summary: pt remained alert and oriented for the rest of this shift. VSS. NSR/ST on the monitor. Respirations even and unlabored. O2 sats at 95% on 2LNC. Tolerating a CLD, voiding w/o difficulty. Passing gas but no BM during this shift. Right and left JP drains in place with sero sang output. Lap sites are CDI. BMAT 4. No acute events occurred overnight; pt remained stable. Call light within reach, will endorse plan of care to the upcoming shift.    BP 144/83  ~ Pulse 80  ~ Temp 37.7 C (99.8 F) (Oral)  ~ Resp 17  ~ Ht 1.626 m (5' 4'') Comment: on 06/02/2019 ~ SpO2 93%  ~ BMI 24.22 kg/m

## 2019-06-27 NOTE — Other
Patients Clinical Goal:   Clinical Goal(s) for the Shift: will remain hemodynamically stable, pain control, ambulate, wean off O2   Identify possible barriers to advancing the care plan: none  Stability of the patient: Moderately Stable - low risk of patient condition declining or worsening   End of Shift Summary:     Pre procedure Diagnose: N/V and abdominal pain on 06/20/19.    Allergies: Hydrocodone, Ibuprofen     Procedure: POD # 6 of  laparoscopic vagotomy and antrectomy, gastrojejunostomy, and hiatal hernia repair on 06/21/19 by D. Chen     Review of Systems:  Gen: NAD. Denies fever or chills  Neuro: AOx4. Follow commands.   CV: Nonmonitored. Denies any CP, palpitations, dizziness, light headedness  Resp:  O2 sat >92% on 1L. Desat to 86-87% on RA. Hx of COPD and smoker. Encouraged to use IS. Lungs CTA. Denies any SOB, wheezing, cough.  GI: Diet advanced to mechanical soft diet, tolerating. Denies any abd pain, n/v/d/c. + flatus. LBM ?Marland Kitchen Continent.   GU: Voids spontaneously without difficulty. Denies any dysuria, frequency, urgency, difficulty voiding.  Pain:dPCA discontinued this AM. Pain moderately controlled with Dilaudid 0.2 prn and Oxycodone 15 mg PO.  BMAT:  4, standby assist. Encouraged to ambulate in hallway  Skin: lap site with steri strip. R JP in place.   Safety: All needs met. Call light within reach. Bed in low position. Bed alarm on. Safety precaution reinforced.     IV/Drain:  #L FA #22  # R abdominal JP output: 10 ml      Procedures/Test/Consults  none       Overview of Vitals/Critical Labs  .BP 122/61  ~ Pulse 95  ~ Temp 36.6 C (97.8 F) (Oral)  ~ Resp 16  ~ Ht 1.626 m (5' 4'')  ~ Wt 67.4 kg (148 lb 9.4 oz)  ~ SpO2 (!) 92%  ~ BMI 25.51 kg/m     Review of Discharge Planning  Will d/c home when hemodynamically stable      Teaching Patient  #Pain regimen   #ambulation and IS usage     Plan of Care    #Pain management    1900: Nursing care endorsed to night shift RN

## 2019-06-27 NOTE — Progress Notes
Physical Therapy Treatment #3      PATIENT: Donna Gross  MRN: [id number]    Treatment Date: 06/27/2019    Patient Presentation: In bed;HLIV;Oxygen;Pulse ox;Drain;Bed alarm on(JP drain x2 abdomen)    Pertinent Updates: PCA discontinued just prior to PT, O2 being weaned    Precautions   Precautions: Fall risk;Monitor Vitals;Check Labs  Orthotic: None  Current Activity Order: Activity as tolerated  Weight Bearing Status: Not Applicable  Additional Weight Bearing Status: Not Applicable    Cognition   Cognition: Within Defined Limits  Safety Awareness: Good awareness of safety precautions  Barriers to Learning: None    Bed Mobility   Supine Scooting: Independent  Rolling: Independent;to Left;Side Rails;Verbal Cueing  Supine to Sit: Independent;to Left  Sit to Supine: Independent    Functional Mobility   Sit to Stand: Supervised  Ambulation: Stand by Assist;Supervised  Ambulation Distance (Feet): >360feet  Gait Pattern: Decreased stride length;Decreased arm swing;Decreased pace;Decreased heel-toe  Assistive Device: None  Wheelchair: Not Performed  Stairs: Minimum Assist;Contact Guard Assist  Stair Management Technique: Step to pattern;Forwards  Number of Stairs: 4(stairs in stairwell)     Pain Assessment   Patient complains of pain: Yes  Pain Quality: Aching  Pain Scale Used: Numeric Pain Scale  Pain Intensity: 9/10  Pain Location: Abdomen;Incision  Action Taken: Patient premedicated;Positioning                        Patient Status   Activity Tolerance: Good  Oxygen Needs: Nasal cannula  Flow (& FiO2): 1L- increased to 1.5L for ambulation  Response to Treatment: Tolerated treatment well(O2 sats 78-80% upon return to room but noted O2 tank out of oxygen upon return to room- RN notified; HR 140s post- ambulation)  Compliance with Precautions: Good  Call light in reach: Yes  Presentation post treatment: In bed;On oxygen;Lines/drains intact;Pulse Ox;Bed alarm on Comments: Pt requiring some encouragement for participation, voicing frustration regarding O2 requirements and having to wear NC.  Pt encouraged to mobilize by RN and PT.  Pt demonstrating excellent overall mobility, ambulating well without AD.  Reiterated recommendation for chair for all meals and ambulation in hallway with RN staff at least TID.    Interdisciplinary Communication   Interdisciplinary Communication: Nurse(Liz)    Treatment Plan   Continue PT Treatment Plan with Focus on: Gait training;Stair training;Therapeutic exercise    PT Recommendations   Discharge Recommendation: Physical Therapy;1-3 x/week  Type of therapy: Home Health  Supervision Recommended on Discharge: 12-24 hrs/day;Initially, wean supervision as tolerated  Discharge Equipment Recommended: None  Equipment ordered: Not applicable    Treatment Completed by: Rise Mu, PT

## 2019-06-27 NOTE — Progress Notes
INPATIENT H&P / GENERAL SURGERY / [facility] [id number]    PATIENT:  Donna Gross  MRN:  [id number]  DOB:  May 08, 1966  DATE OF SERVICE:  06/27/2019    GENERAL SURGERY ATTENDING PHYSICIAN:  Dr. [name]  PRIMARY CARE PHYSICIAN:  [facility]    HPI:  Kaede Clendenen is a 53 y.o. female with complex peptic ulcer history who presented to ED with 3 days of N/V and abdominal pain on 06/20/19. She was admitted the night before scheduled surgery. She is s/p scheduled laparoscopic vagotomy and antrectomy, gastrojejunostomy, and hiatal hernia repair (06/21/19).     Interval Events/Subjective:    06/21/19: Pt with nausea but no emesis overnight.  Has c/o of constant abdominal pain. Pt has been NPO for surgery.  06/22/19: NGT left in place after surgery. Patient enduring moderate post-operative pain. Was evaluated without evidence of acute pathology, bleed, or infection. Started on Dilaudid prn for post-operative pain (has chronic pain and moderate opioid requirements at home) in addition to existing PCEA. Patient continues to have pain this morning that is under improved control. Denies nausea or vomiting.  06/23/19: Required 500cc NS bolus x 2 yesterday for rising Cr and SBPs to low 100s. Also started IV tylenol and Dilaudid PCA for pain control. Pt reports pain well controlled this morning. Denies N/V, fevers, or chills. Requiring nasal cannula to maintain O2 sats>92%, but pt denies SOB, respiratory distress, or chest pain.  06/24/19: No acute events. Pulled NGT this morning herself confusing it for nasal cannula. NGT not replaced. Denies passing gas or having BM. O2 requirements decreasing, on 1L NC as of this AM. Complained of itching yesterdayrequiring IV Benadryl x 2. Denies fevers, chills, N/V, or new/worsening abdominal pain other than incisional tenderness.  06/25/19: No acute events. Denies N/V. Denies BM or flatus. Reports abdominal pain remains severe but is improving overall and well controlled with current PCA. Reports sleeping for 2 hrs at a time overnight, waking in 10/10 pain, using PCA with relief. Reports bilateral shoulder and calf pruritis. Reports extremely painful bump on midline upper back. Reports new onset numbness on back of L hand beginning this morning, located around area of IV trials. Reports feeling hungry and is enthusiastic about returning to eating.  06/26/19: No acute events. Denies N/V. Tolerated 500cc/shift clears yesterday without issue. Now passing gas, up and walking, no bowel movements. Continues to have abdominal pain, improved overall. Remains on Dilaudid PCA.  06/27/19: NAEON. Pt slept well with restart of home seroquel. Walking with PT. Passing gas. No BM yesterday. Pain is stable. Pt continues to be on nasal cannula, reports she does not use oxygen at home. Does use nebulized breathing treatments at home prn.    Temp:  [36.6 ???C (97.8 ???F)-37.7 ???C (99.8 ???F)] 36.6 ???C (97.8 ???F)  Heart Rate:  [80-109] 95  Resp:  [16-17] 16  BP: (117-144)/(61-98) 122/61  NBP Mean:  [80-111] 80  SpO2:  [92 %-100 %] 92 %       I/O last 3 completed shifts:  In: 2312 [P.O.:1130; I.V.:1182]  Out: 3907.5 [Urine:3875; Drains:32.5]    Diets/Supplements/Feeds   Diet    Diet mechanical soft     Start Date/Time: 06/27/19 0820      Number of Occurrences: Until Specified       Scheduled Meds:  ??? acetaminophen  650 mg Oral Q6H   ??? cyclobenzaprine  5 mg Oral TID   ??? enoxaparin  40 mg Subcutaneous QHS   ???  mineral oil-hydrophilic petrolatum   Topical 6x Daily   ??? pantoprazole  40 mg IV Push Q24H   ??? QUEtiapine  100 mg Oral BID       PRN Meds:  albuterol, HYDROmorphone, [DISCONTINUED] HYDROmorphone **AND** [DISCONTINUED] sodium chloride **AND** naloxone, ondansetron, oxyCODONE **OR** oxyCODONE **OR** oxyCODONE    Exam:  Current BP 122/61  ~ Pulse 95  ~ Temp 36.6 ???C (97.8 ???F) (Oral)  ~ Resp 16  ~ Ht 1.626 m (5' 4'') Comment: on 06/02/2019 ~ SpO2 (!) 92%  ~ BMI 24.22 kg/m???   Gen: NAD HEENT: NCAT, EOMI, MMM, neck supple. No NGT in place.   CV: RRR, nl s1 s2  Pulm: Normal respiratory excursions, not tachypneic, no labored breathing.   Abd: Soft, non-distended. Multiple prior incisions, feeding tube sites, drain sites. Has mild expected incisional tenderness, and diffuse mild abdominal tenderness. Incisions clean/dry/intact. Has two abdominal JP drains with scant serosanguineous output.  GU: Voiding spontaneously  Ext: warm, well perfused, no cyanosis, clubbing, or edema. Raised, extremely tender, non-erythematous 2cm diameter bump with central scab on upper midline back at site of previous attempted epidural placement. No rash or excoriations on shoulders. Dry skin and non-bleeding excoriations on bilateral calves. Numbness on back of L hand, between MCP joint line and wrist, extending full width of hand. Moving legs well bilaterally.  Neuro: A&Ox3  Drains: abdominal JPx2. Both with scant serosanguineous output.  Output by Drain (mL) 06/25/19 0701 - 06/25/19 1900 06/25/19 1901 - 06/26/19 0700 06/26/19 0701 - 06/26/19 1900 06/26/19 1901 - 06/27/19 0700 06/27/19 0701 - 06/27/19 1432   Surgical Drain 1 Right Abdomen JP 5 7.5 7.5 5    Surgical Drain 2 Left Abdomen JP 3 5 5  2.5         Labs:  Recent Labs     06/27/19  0727 06/26/19  0247 06/25/19  0639   HGB 8.2* 7.8* 8.8*   HCT 25.9* 24.8* 28.2*   PLT 440* 365 355     No results for input(s): APTT, PT, INR in the last 72 hours.    Recent Labs     06/27/19  0727 06/26/19  0247 06/25/19  0639   CREAT 0.73 0.81 0.86   BUN 5* 5* 4*   NA 141 137 141   K 3.5* 3.7 4.6   CL 102 100 101   CO2 26 31* 28   GLUCOSE 80 86 84   CALCIUM 8.8 8.5* 9.3     Recent Labs     06/27/19  0727 06/26/19  0247 06/25/19  0639   MG 1.6 1.2* 1.4   PHOS 3.2 3.9 3.2     Recent Labs     06/27/19  0727 06/26/19  0247 06/25/19  0639   GLUCOSE 80 86 84       No results for input(s): TOTPRO, ALBUMIN in the last 72 hours.  No results for input(s): PREALBUMIN in the last 72 hours. No results for input(s): BILITOT, BILICON, AST, ALT, ALKPHOS, LDH in the last 72 hours.  No results for input(s): INR in the last 72 hours.  No results for input(s): AMYLASE, LIPASE in the last 72 hours.    Micro:   Recent Labs     06/27/19  0727 06/26/19  0247 06/25/19  0639   WBC 7.86 9.25 9.80       Recent Results (from the past 336 hour(s))   COVID-19 PCR, Nasopharyngeal    Collection Time: 06/19/19  3:47 PM  Result Value Ref Range    Specimen Type Respiratory, Upper     COVID-19 PCR Not Detected Not Detected     Imaging:  Xr Chest Ap Portable (1 View)    Result Date: 06/24/2019  IMPRESSION:  Epidural catheter is present. NG/OG is removed. Upper abdominal surgical drains. Allowing for low lung volume the cardiomediastinal silhouette is stable. Mild interstitial pulmonary edema and vascular congestion. Interval increase in small left more than right bilateral pleural effusions with associated atelectasis/consolidation. Decrease in left lateral chest wall subcutaneous emphysema. Signed by: Floy Sabina   06/24/2019 11:42 AM    Xr Kub Portable (1 View)    Result Date: 06/20/2019  IMPRESSION: Moderate stool burden. Normal bowel gas pattern. No intraperitoneal free air. No acute osseous abnormality. I, Quentin Cornwall, M.D., have reviewed this radiological study personally and I am in full agreement with the findings of the report presented here. Dictated by: Carolina Cellar   06/20/2019 4:12 PM Signed by: Quentin Cornwall   06/20/2019 4:31 PM    Xr Chest For Feeding Tube Placement (1 View)    Result Date: 06/22/2019  IMPRESSION: Epidural catheter is present. An enteric tube coursing below diaphragm with the tip projecting over mid gastric body. Upper abdominal surgical drains. Allowing for low lung volume the cardiomediastinal silhouette is stable. Mild interstitial pulmonary edema and vascular congestion. Left chest and abdominal wall moderate subcutaneous emphysema. Signed by: Benjamine Sprague   06/22/2019 9:12 AM Assessment/Plan:  Saleen Peden is a 53 y.o. female with multiple prior surgeries that stemmed from recurrent peptic ulcer disease for which she now presents. She is now 6 Days Post-Op s/p scheduled laparoscopic vagotomy and antrectomy, gastrojejunostomy, and hiatal hernia repair (06/21/19) c/b aspiration during induction of anesthesia for which patient is on empiric IV abx. Pts O2 requirements have continued to decrease in post-operative setting. CXR (06/24/19) shows mild pleural effusions with atelectasis as well as pulmonary congestion, but no focal consolidations or infiltrates to suggest pneumonia/pneumonitis. Mild post-op AKI resolved. Patient is otherwise recovering well without acute issues.     #Neuro  - Pain Management: PO tylenol, oxy s/s, dilaudid breakthrough  - Acute Pain consulted, appreciate recs  - Activity as tolerated    #Pulmonary   - Pulm Toileting: Incentive spirometer   - Consult RT to evaluate given ongoing O2 requirements and pt does not use O2 at home    #GI  - Bowel regimen: Sips of clears as tolerated   - Ulcer ppx: Protonix 40mg  IV QDay  - Nutrition orders: Diet mechanical soft  - Daily BMP   - Replete electrolytes prn    #GU:   - voiding sponataneously    #ID/ Heme: Pt had large aspiration event during induction of anesthesia prior to surgery. She was treated with Zosyn (7/1-6) empirically to prevent pneumonitis/pneumonia.  - Daily CBC    #Ext:  - monitor raised, painful bump on midline upper back  - give Aquaphor for pruritic zones on shoulders and calves  - monitor numbness on back of L hand  - zofran PRN itching as regular Nalbuphine for itching is not available due to shortage    #PPx   - SCDs  - OOB to chair today and walking as tolerated    #Disposition  - D/C home when tolerating regular diet and pain is controlled on PO pain regimen.  - No home health needs anticipated.  - Please page [phone] (surgery, [place] team) with any questions.  - Code Status: Full Code Patient seen  with General Surgery Team and plan of care discussed with Attending, Alonna Buckler., MD, who is in agreement.     Author:    Remi Deter, MD  2:32 PM 06/27/2019  Division of General Surgery  [facility]

## 2019-06-28 LAB — Basic Metabolic Panel
POTASSIUM: 3.9 mmol/L (ref 3.6–5.3)
SODIUM: 141 mmol/L (ref 135–146)

## 2019-06-28 LAB — Phosphorus: PHOSPHORUS: 3.7 mg/dL (ref 2.3–4.4)

## 2019-06-28 LAB — Magnesium: MAGNESIUM: 1.6 meq/L (ref 1.4–1.9)

## 2019-06-28 LAB — Differential Automated: MONOCYTE PERCENT, AUTO: 10.8 (ref 0.20–0.80)

## 2019-06-28 LAB — CBC: WHITE BLOOD CELL COUNT: 8.3 10*3/uL (ref 4.16–9.95)

## 2019-06-28 MED ADMIN — ACETAMINOPHEN 500 MG PO TABS: 1000 mg | ORAL | @ 23:00:00 | Stop: 2019-06-29 | NDC 00904673061

## 2019-06-28 MED ADMIN — ACETAMINOPHEN 325 MG PO TABS: 650 mg | ORAL | @ 07:00:00 | Stop: 2019-06-28 | NDC 00904677361

## 2019-06-28 MED ADMIN — CYCLOBENZAPRINE HCL 5 MG PO TABS: 5 mg | ORAL | @ 12:00:00 | Stop: 2019-06-28 | NDC 68084075325

## 2019-06-28 MED ADMIN — POLYETHYLENE GLYCOL 3350 17 G PO PACK: 17 g | ORAL | @ 15:00:00 | Stop: 2019-06-29 | NDC 00904693181

## 2019-06-28 MED ADMIN — ONDANSETRON HCL 4 MG/2ML IJ SOLN: 4 mg | INTRAVENOUS | @ 22:00:00 | Stop: 2019-07-03 | NDC 60505613005

## 2019-06-28 MED ADMIN — AQUAPHOR EX OINT: TOPICAL | @ 21:00:00 | Stop: 2019-07-03

## 2019-06-28 MED ADMIN — OXYCODONE HCL 5 MG PO TABS: 20 mg | ORAL | Stop: 2019-06-29 | NDC 00406055262

## 2019-06-28 MED ADMIN — HYDROMORPHONE HCL 1 MG/ML IJ SOLN: .2 mg | INTRAVENOUS | Stop: 2019-06-28 | NDC 00409128331

## 2019-06-28 MED ADMIN — HYDROMORPHONE HCL 1 MG/ML IJ SOLN: .4 mg | INTRAVENOUS | @ 07:00:00 | Stop: 2019-06-28 | NDC 00409128331

## 2019-06-28 MED ADMIN — METHOCARBAMOL 500 MG PO TABS: 1000 mg | ORAL | @ 21:00:00 | Stop: 2019-06-29 | NDC 70010075401

## 2019-06-28 MED ADMIN — CYCLOBENZAPRINE HCL 5 MG PO TABS: 5 mg | ORAL | @ 18:00:00 | Stop: 2019-06-28 | NDC 68084075325

## 2019-06-28 MED ADMIN — AQUAPHOR EX OINT: TOPICAL | @ 03:00:00 | Stop: 2019-07-03

## 2019-06-28 MED ADMIN — PANTOPRAZOLE SODIUM 40 MG IV SOLR: 40 mg | INTRAVENOUS | @ 03:00:00 | Stop: 2019-06-28 | NDC 00143928410

## 2019-06-28 MED ADMIN — AQUAPHOR EX OINT: TOPICAL | @ 12:00:00 | Stop: 2019-07-03

## 2019-06-28 MED ADMIN — OXYCODONE HCL 5 MG PO TABS: 20 mg | ORAL | @ 03:00:00 | Stop: 2019-06-29 | NDC 00406055262

## 2019-06-28 MED ADMIN — OXYCODONE HCL 5 MG PO TABS: 20 mg | ORAL | @ 18:00:00 | Stop: 2019-06-29 | NDC 00406055262

## 2019-06-28 MED ADMIN — HYDROMORPHONE HCL 1 MG/ML IJ SOLN: .4 mg | INTRAVENOUS | @ 12:00:00 | Stop: 2019-06-28 | NDC 00409128331

## 2019-06-28 MED ADMIN — OXYCODONE HCL 5 MG PO TABS: 10 mg | ORAL | @ 21:00:00 | Stop: 2019-06-29 | NDC 00406055262

## 2019-06-28 MED ADMIN — PANTOPRAZOLE SODIUM 40 MG PO TBEC: 40 mg | ORAL | @ 15:00:00 | Stop: 2019-06-29 | NDC 68084081309

## 2019-06-28 MED ADMIN — QUETIAPINE FUMARATE 100 MG PO TABS: 100 mg | ORAL | @ 03:00:00 | Stop: 2019-07-03 | NDC 00904664061

## 2019-06-28 MED ADMIN — HYDROMORPHONE HCL 1 MG/ML IJ SOLN: .4 mg | INTRAVENOUS | @ 22:00:00 | Stop: 2019-06-29 | NDC 00409128331

## 2019-06-28 MED ADMIN — QUETIAPINE FUMARATE 100 MG PO TABS: 100 mg | ORAL | @ 15:00:00 | Stop: 2019-07-03 | NDC 00904664061

## 2019-06-28 MED ADMIN — HYDROMORPHONE HCL 1 MG/ML IJ SOLN: .4 mg | INTRAVENOUS | @ 15:00:00 | Stop: 2019-06-29 | NDC 00409128331

## 2019-06-28 MED ADMIN — OXYCODONE HCL 5 MG PO TABS: 20 mg | ORAL | @ 14:00:00 | Stop: 2019-06-29 | NDC 00406055262

## 2019-06-28 MED ADMIN — LIDOCAINE 5 % EX PTCH: 1 | TRANSDERMAL | Stop: 2019-07-03 | NDC 00591352530

## 2019-06-28 MED ADMIN — ACETAMINOPHEN 325 MG PO TABS: 650 mg | ORAL | @ 03:00:00 | Stop: 2019-06-28

## 2019-06-28 MED ADMIN — AQUAPHOR EX OINT: TOPICAL | @ 22:00:00 | Stop: 2019-07-03

## 2019-06-28 MED ADMIN — OXYCODONE HCL 5 MG PO TABS: 20 mg | ORAL | @ 09:00:00 | Stop: 2019-06-29 | NDC 00406055262

## 2019-06-28 MED ADMIN — DOCUSATE SODIUM 100 MG PO CAPS: 100 mg | ORAL | @ 15:00:00 | Stop: 2019-06-29 | NDC 00904645561

## 2019-06-28 MED ADMIN — ACETAMINOPHEN 325 MG PO TABS: 650 mg | ORAL | @ 12:00:00 | Stop: 2019-06-28 | NDC 00904677361

## 2019-06-28 MED ADMIN — AQUAPHOR EX OINT: TOPICAL | @ 15:00:00 | Stop: 2019-07-03

## 2019-06-28 MED ADMIN — ACETAMINOPHEN 325 MG PO TABS: 650 mg | ORAL | @ 18:00:00 | Stop: 2019-06-28 | NDC 00904677361

## 2019-06-28 MED ADMIN — LIDOCAINE 5 % EX PTCH: 1 | TRANSDERMAL | @ 12:00:00 | Stop: 2019-07-03

## 2019-06-28 MED ADMIN — HYDROMORPHONE HCL 1 MG/ML IJ SOLN: .4 mg | INTRAVENOUS | @ 03:00:00 | Stop: 2019-06-28 | NDC 00409128331

## 2019-06-28 MED ADMIN — ENOXAPARIN SODIUM 40 MG/0.4ML SC SOLN: 40 mg | SUBCUTANEOUS | @ 03:00:00 | Stop: 2019-06-29 | NDC 60505079204

## 2019-06-28 MED ADMIN — CYCLOBENZAPRINE HCL 5 MG PO TABS: 5 mg | ORAL | @ 03:00:00 | Stop: 2019-06-28 | NDC 68084075325

## 2019-06-28 NOTE — Nursing Note
Rounded patient around 2000,patient breathing slightly shallow, encouraged her deep breathing exercise and  incentive spirometer use , patient stated she can not do the spirometer becauses she is  in pain,and feeling upset for stopping her PCA.  explained to patient risk and benefits and use and purpose of the incentive spirometer , Patient thinks nurses forcing her to do the spirometer and continue to refuse.

## 2019-06-28 NOTE — Consults
Ubreathe Consult Initial Evaluation and Enrollment    Patient Info    Name:  Donna Gross, Donna Gross     DOB: 05-28-1966   MRN: [id number]   Admission Date: 06/22/2019     Unit: 4NW  Primary Learner(s): patient                      Current Insurance Provider: [employer]  PCP: [facility]   Pulmonologist: unknown  Reason for Consult:  evaluate patient with COPD for post-operative oxygen requirements    History     Chief Complaint   Patient presents with   ??? Abdominal Pain     Pt reports worsening epigastric pain since last night. Pt is scheduled to have vagotomy, antrectomy, and gastojejunostomy with Dr [name] tomorrow with possible hiatal and ventral hernia repair      LACE+ Score: 35  Active Problems: s/p laparoscopic vagotomy and antrectomy, gastrojejunostomy, and hiatal hernia repair (06/21/19). Patient requiring O2 after surgery, possibly due to aspiration.  Past Medical History:   Diagnosis Date   ??? Anxiety    ??? Depression    ??? Emphysema, unspecified (HCC/RAF)    ??? Fibromyalgia    ??? Gastritis    ??? GERD (gastroesophageal reflux disease)    ??? Pancreatitis        Past Surgical History:   Procedure Laterality Date   ??? ABDOMINAL SURGERY     ??? COLON SURGERY         Current Facility-Administered Medications   Medication Dose Route Frequency   ??? acetaminophen tab 1,000 mg  1,000 mg Oral Q8H   ??? [EXPIRED] albuterol 90 mcg/act inh 2 puff  2 puff Inhalation Q4H PRN   ??? docusate cap 100 mg  100 mg Oral BID   ??? enoxaparin 40 mg/0.4 mL inj 40 mg  40 mg Subcutaneous QHS   ??? HYDROmorphone 1 mg/mL inj 0.4 mg  0.4 mg IV Push Q6H PRN   ??? lidocaine 5% patch 1 patch  1 patch Transdermal Q24H   ??? methocarbamol tab 1,000 mg  1,000 mg Oral QID   ??? mineral oil-hydrophilic petrolatum (Aquaphor) oint   Topical 6x Daily   ??? naloxone 0.4 mg/mL inj 0.1 mg  0.1 mg IV Push PRN   ??? ondansetron 4 mg/2 mL inj 4 mg  4 mg IV Push Q8H PRN   ??? oxyCODONE tab 10 mg  10 mg Oral Q4H PRN    Or   ??? oxyCODONE tab 15 mg  15 mg Oral Q4H PRN    Or ??? oxyCODONE tab 20 mg  20 mg Oral Q4H PRN   ??? oxyCODONE tab 10 mg  10 mg Oral Q4H   ??? pantoprazole DR tab 40 mg  40 mg Oral Daily   ??? polyethylene glycol pwd pkt 17 g  17 g Oral BID   ??? QUEtiapine tab 100 mg  100 mg Oral BID   ??? senna tab 1 tablet  1 tablet Oral QHS   ??? [DISCONTINUED] acetaminophen tab 650 mg  650 mg Oral Q6H   ??? [DISCONTINUED] cyclobenzaprine tab 5 mg  5 mg Oral TID   ??? [DISCONTINUED] HYDROmorphone 1 mg/mL inj 0.2 mg  0.2 mg IV Push Q2H PRN   ??? [DISCONTINUED] HYDROmorphone 1 mg/mL inj 0.4 mg  0.4 mg IV Push Q2H PRN   ??? [DISCONTINUED] naloxone 0.4 mg/mL inj 0.1 mg  0.1 mg IV Push PRN   ??? [DISCONTINUED] oxyCODONE tab 10  mg  10 mg Oral Q6H PRN   ??? [DISCONTINUED] oxyCODONE tab 15 mg  15 mg Oral Q6H PRN   ??? [DISCONTINUED] oxyCODONE tab 20 mg  20 mg Oral Q8H   ??? [DISCONTINUED] oxyCODONE tab 20 mg  20 mg Oral Q8H   ??? [DISCONTINUED] oxyCODONE tab 5 mg  5 mg Oral Q6H PRN   ??? [DISCONTINUED] pantoprazole inj 40 mg  40 mg IV Push Q24H   ??? [DISCONTINUED] polyethylene glycol pwd pkt 17 g  17 g Oral Daily   ??? [DISCONTINUED] senna tab 1 tablet  1 tablet Oral QHS PRN        Allergies   Allergen Reactions   ??? Hydrocodone Other (See Comments)     ''burns a hole in stomach''   ??? Ibuprofen Other (See Comments)     GI discomfort    ''hurts my stomach''        reports that she has been smoking cigarettes. She has been smoking about 0.25 packs per day. She uses smokeless tobacco. She reports that she does not drink alcohol or use drugs.    Family History   Problem Relation Age of Onset   ??? Heart disease Mother    ??? Diabetes Brother    ??? Diabetes Father    ??? Anesthesia problems Neg Hx    ??? Malignant hypertension Neg Hx    ??? Hypotension Neg Hx    ??? Malignant hyperthermia Neg Hx    ??? Pseudochol deficiency Neg Hx         Pulmonary History: Patient has history of COPD/emphysema, but no PFTs in chart.  Patient unable to remember if she has had PFTs. Reports that she used to smoke a pack a day for 30 years up until 6 years ago, but smokes 1 or 2 cigarettes a day since then.     Immunizations   There is no immunization history on file for this patient.    Smoking History:   Social History     Tobacco Use   Smoking Status Current Every Day Smoker   ??? Packs/day: 0.25   ??? Types: Cigarettes   Smokeless Tobacco Current User   Tobacco Comment    30 years         Home O2: none    Previous Hospital Admissions in Past 12 Months: multiple hospitalizations for abdominal surgeries  Number of Exacerbations in Past 24 Months: patient is a poor historian, but reports she has been hospitalized many times for COPD exacerbations.  Last PFT:   PFT Results     No lab values to display.         COPD GOLD Stage: unknown  History of Asthma? no  Attended Pulmonary Rehab? no    Current Home Respiratory Therapy Regimen    Home Respiratory Medications:   ? Wixela (fluticasone-salmeterol 250-50) one puff BID  ? Combivent respimat PRN  ? Duonebs PRN via home nebulizer  ? Albuterol HFA PRN for shortness of breath    How often do you use your rescue inhaler per week, on average? daily  How quickly does your rescue inhaler medication work for you? Pretty quickly  Do you use a spacer? No    Health Literacy Assessment    Prior Education on Disease Pathology and Respiratory Medications: patient has minimal understanding of COPD and management  Health Confidence Score (???How confident are you that you can control and manage most of your health problems????): 8/10  If your HCS is <  7, what would it take to increase your score? n/a            Physical Examination    Vitals:    06/28/19 0347 06/28/19 0559 06/28/19 0729 06/28/19 1345   BP: 114/75  107/67 97/68   Patient Position:       Pulse: 88 78 90 78   Resp: 17  18 18    Temp: 36.8 ???C (98.2 ???F)  36.9 ???C (98.5 ???F) 37.1 ???C (98.8 ???F)   TempSrc: Oral  Oral Oral   SpO2: 94% 96% 94% 94%   Weight:       Height: Lung Sounds: diminished bilaterally, some fine crackles in the bases         Cough Strength: strong  Secretions: none  VBG/ABG: none  Current O2 requirements: 2 LPM NC  Recent Imaging: 06/24/2019:   XR CHEST AP 1V PORTABLE    ???  CLINICAL HISTORY: Shortness of breath.  ???  COMPARISON: 06/21/2019.  ???  ???  IMPRESSION:    ???  Epidural catheter is present.  NG/OG is removed.  Upper abdominal surgical drains.  Allowing for low lung volume the cardiomediastinal silhouette is stable.  Mild interstitial pulmonary edema and vascular congestion.  Interval increase in small left more than right bilateral pleural effusions with associated atelectasis/consolidation. Decrease in left lateral chest wall subcutaneous emphysema.  ???  PLAN  -recommend starting patient on Advair (250-50) while in hospital: 1 puff BID, as patient recently started on Wixela at home  -patient may benefit from duonebs or combivent on a frequency while here (patient uses a few times a day at home to control symptoms)  -provide education to improve patient's knowledge of home COPD management      Alfonzo Beers, BSRT, RRT, RCP  [place] Navigator, RT III  Any questions, feel free to call [phone] or page [phone]

## 2019-06-28 NOTE — Other
Patients Clinical Goal: rest, safety ,comfort  Clinical Goal(s) for the Shift: rest, safety , comfort  Identify possible barriers to advancing the care plan: none  Stability of the patient: Moderately Stable - low risk of patient condition declining or worsening   End of Shift Summary: received patient from day shift RN. Patient is alert and oriented x4. 1-2 L NC. BMAT 4. Ambulates to bathroom . Voids with no difficulty. Receives Dilaudid IV and oxycodone 20 mg prn for pain. Pain managed throughout the shift. Tolerating mechanical soft diet. Last BM 7/ 5. Passing gas. Cont pulse ox. Left JP drain 3 ml output. Steri strips to abd. Right dry dressing noted from old JP removal. Bed in lowest position. Call light in reach will cont to monitor .

## 2019-06-28 NOTE — Progress Notes
NUTRITION IN-DEPTH SCREEN (Adult)    Admit Date: 06/22/2019     Date of Birth: 1966/05/18 Gender: female MRN: [id number]     Date of Screening 06/28/2019   Subjective: Pt resting at time of visit, reports tolerating po's, denies N/V/diarrhea, last BM on 7/5. Pt requesting a blueberry muffin and jello for a snack. Reports no other nutritional issues or concerns at this time.   Problems: Active Problems:    Acute epigastric pain POA: Yes    Peptic ulcer disease POA: Yes       Past Medical History:   Diagnosis Date   ??? Anxiety    ??? Depression    ??? Emphysema, unspecified (HCC/RAF)    ??? Fibromyalgia    ??? Gastritis    ??? GERD (gastroesophageal reflux disease)    ??? Pancreatitis     Past Surgical History:   Procedure Laterality Date   ??? ABDOMINAL SURGERY     ??? COLON SURGERY           Anthropometrics     Height: 162.6 cm (5' 4'')(on 06/02/2019)  Admit Weight: (No adm wt.) (06/23/19 1558) Last 5 recorded weights:  Weights 03/09/2019 03/09/2019 06/02/2019 06/23/2019 06/27/2019   Weight 59.4 kg 59.4 kg 64 kg (No Data) 67.4 kg            IBW: 54.4 kg (120 lb)  % Ideal Body Weight: 124 %  BMI (Calculated): 25.6    Usual Weight: 59.4 kg (131 lb)(Per chart)  % Usual Weight: 113 %     Wt Readings from Last 10 Encounters:   06/27/19 67.4 kg (148 lb 9.4 oz)   06/02/19 64 kg (141 lb 1.6 oz)   03/09/19 59.4 kg (131 lb)   03/09/19 59.4 kg (131 lb)   12/26/15 49.9 kg (110 lb)        Allergies   Hydrocodone and Ibuprofen     Cultural / Religious / Ethnic Food Preferences   None       Nutrition Prior to Admission   Follows a regular diet per pt.     Nutrition Risk Factors      Acuity Level: 1-No risk identified        Diet Orders     Diets/Supplements/Feeds   Diet    Diet mechanical soft     Start Date/Time: 06/27/19 0820      Number of Occurrences: Until Specified        Impression   PO % consumed: 76 to 100%x2, 26-50%  Impression: Variable intake, Diet tolerated well with fair intake, Pt/family requesting snacks and supplements Fair po's, denies N/V/diarrhea, last BM on 7/5. Wt gain noted from previous visit   Diet Education   No diet education needs at this time      FDI Target Drugs: No          Nutrition Care Plan   Plan: Continue with diet as ordered, Trend weights, Monitor tolerance to diet    Comments: Monitor advancement of diet If NPO is 5 days,will refer to the RD.  Will follow per policy.    Next Follow-up by 07/03/19    Author:  Orlin Hilding, DTR, pager [phone]  06/28/2019 11:25 AM

## 2019-06-28 NOTE — Progress Notes
INPATIENT H&P / GENERAL SURGERY / [facility] [phone]    PATIENT:  Donna Gross  MRN:  [id number]  DOB:  [birth year]  DATE OF SERVICE:  06/28/2019    GENERAL SURGERY ATTENDING PHYSICIAN:  Dr. [name]  PRIMARY CARE PHYSICIAN:  [facility], [facility]    HPI:  Donna Gross is a 53 y.o. female with complex peptic ulcer history who presented to ED with 3 days of N/V and abdominal pain on 06/20/19. She was admitted the night before scheduled surgery. She is s/p scheduled laparoscopic vagotomy and antrectomy, gastrojejunostomy, and hiatal hernia repair (06/21/19).     Interval Events/Subjective:    06/21/19: Pt with nausea but no emesis overnight.  Has c/o of constant abdominal pain. Pt has been NPO for surgery.  06/22/19: NGT left in place after surgery. Patient enduring moderate post-operative pain. Was evaluated without evidence of acute pathology, bleed, or infection. Started on Dilaudid prn for post-operative pain (has chronic pain and moderate opioid requirements at home) in addition to existing PCEA. Patient continues to have pain this morning that is under improved control. Denies nausea or vomiting.  06/23/19: Required 500cc NS bolus x 2 yesterday for rising Cr and SBPs to low 100s. Also started IV tylenol and Dilaudid PCA for pain control. Pt reports pain well controlled this morning. Denies N/V, fevers, or chills. Requiring nasal cannula to maintain O2 sats>92%, but pt denies SOB, respiratory distress, or chest pain.  06/24/19: No acute events. Pulled NGT this morning herself confusing it for nasal cannula. NGT not replaced. Denies passing gas or having BM. O2 requirements decreasing, on 1L NC as of this AM. Complained of itching yesterdayrequiring IV Benadryl x 2. Denies fevers, chills, N/V, or new/worsening abdominal pain other than incisional tenderness.  06/25/19: No acute events. Denies N/V. Denies BM or flatus. Reports abdominal pain remains severe but is improving overall and well controlled with current PCA. Reports sleeping for 2 hrs at a time overnight, waking in 10/10 pain, using PCA with relief. Reports bilateral shoulder and calf pruritis. Reports extremely painful bump on midline upper back. Reports new onset numbness on back of L hand beginning this morning, located around area of IV trials. Reports feeling hungry and is enthusiastic about returning to eating.  06/26/19: No acute events. Denies N/V. Tolerated 500cc/shift clears yesterday without issue. Now passing gas, up and walking, no bowel movements. Continues to have abdominal pain, improved overall. Remains on Dilaudid PCA.  06/27/19: NAEON. Pt slept well with restart of home seroquel. Walking with PT. Passing gas. No BM yesterday. Pain is stable. Pt continues to be on nasal cannula, reports she does not use oxygen at home. Does use nebulized breathing treatments at home prn.  06/28/19: NAEON. Pt denies N/V. Tolerating mech soft diet.  Passing flatus. No BM last 24 hours.  Cont to endorse incisional pain.     Temp:  [36.6 ???C (97.8 ???F)-36.9 ???C (98.5 ???F)] 36.9 ???C (98.5 ???F)  Heart Rate:  [78-95] 90  Resp:  [16-18] 18  BP: (107-138)/(61-89) 107/67  NBP Mean:  [80-104] 80  SpO2:  [92 %-96 %] 94 %       I/O last 3 completed shifts:  In: 752.8 [P.O.:750; I.V.:2.8]  Out: 3125.5 [Urine:3100; Drains:25.5]    Diets/Supplements/Feeds   Diet    Diet mechanical soft     Start Date/Time: 06/27/19 0820      Number of Occurrences: Until Specified       Scheduled Meds:  ??? acetaminophen  650 mg Oral Q6H   ??? cyclobenzaprine  5 mg Oral TID   ??? docusate  100 mg Oral BID   ??? enoxaparin  40 mg Subcutaneous QHS   ??? lidocaine  1 patch Transdermal Q24H   ??? mineral oil-hydrophilic petrolatum   Topical 6x Daily   ??? oxyCODONE  20 mg Oral Q8H   ??? pantoprazole  40 mg Oral Daily   ??? polyethylene glycol  17 g Oral BID   ??? QUEtiapine  100 mg Oral BID   ??? senna  1 tablet Oral QHS       PRN Meds:  HYDROmorphone, [DISCONTINUED] HYDROmorphone **AND** [DISCONTINUED] sodium chloride **AND** naloxone, ondansetron, oxyCODONE **OR** oxyCODONE **OR** oxyCODONE    Exam:  Current BP 107/67  ~ Pulse 90  ~ Temp 36.9 ???C (98.5 ???F) (Oral)  ~ Resp 18  ~ Ht 1.626 m (5' 4'')  ~ Wt 67.4 kg (148 lb 9.4 oz)  ~ SpO2 94%  ~ BMI 25.51 kg/m???   Gen: NAD  HEENT: NCAT, EOMI, MMM, neck supple. No NGT in place.   CV: RRR, nl s1 s2  Pulm: Normal respiratory excursions, not tachypneic, no labored breathing.   Abd: Soft, non-distended. Multiple prior incisions, feeding tube sites, drain sites. Has mild expected incisional tenderness, and diffuse mild abdominal tenderness. Incisions clean/dry/intact. Has two abdominal JP drains with scant serosanguineous output.  GU: Voiding spontaneously  Ext: warm, well perfused, no cyanosis, clubbing, or edema. Raised, extremely tender, non-erythematous 2cm diameter bump with central scab on upper midline back at site of previous attempted epidural placement. No rash or excoriations on shoulders. Dry skin and non-bleeding excoriations on bilateral calves. Numbness on back of L hand, between MCP joint line and wrist, extending full width of hand. Moving legs well bilaterally.  Neuro: A&Ox3  Drains: abdominal JPx2. Both with serosanguineous output.  Output by Drain (mL) 06/26/19 0701 - 06/26/19 1900 06/26/19 1901 - 06/27/19 0700 06/27/19 0701 - 06/27/19 1900 06/27/19 1901 - 06/28/19 0700 06/28/19 0701 - 06/28/19 0805   Surgical Drain 1 Right Abdomen JP 7.5 5 10 3          Labs:  Recent Labs     06/28/19  0444 06/27/19  0727 06/26/19  0247   HGB 8.1* 8.2* 7.8*   HCT 25.9* 25.9* 24.8*   PLT 513* 440* 365     No results for input(s): APTT, PT, INR in the last 72 hours.    Recent Labs     06/28/19  0444 06/27/19  0727 06/26/19  0247   CREAT 0.86 0.73 0.81   BUN 9 5* 5*   NA 141 141 137   K 3.9 3.5* 3.7   CL 102 102 100   CO2 25 26 31*   GLUCOSE 77 80 86   CALCIUM 8.8 8.8 8.5*     Recent Labs     06/28/19  0444 06/27/19  0727 06/26/19  0247   MG 1.6 1.6 1.2*   PHOS 3.7 3.2 3.9 Recent Labs     06/28/19  0444 06/27/19  0727 06/26/19  0247   GLUCOSE 77 80 86       No results for input(s): TOTPRO, ALBUMIN in the last 72 hours.  No results for input(s): PREALBUMIN in the last 72 hours.   No results for input(s): BILITOT, BILICON, AST, ALT, ALKPHOS, LDH in the last 72 hours.  No results for input(s): INR in the last 72 hours.  No results for input(s): AMYLASE, LIPASE in  the last 72 hours.    Micro:   Recent Labs     06/28/19  0444 06/27/19  0727 06/26/19  0247   WBC 8.30 7.86 9.25       Recent Results (from the past 336 hour(s))   COVID-19 PCR, Nasopharyngeal    Collection Time: 06/19/19  3:47 PM   Result Value Ref Range    Specimen Type Respiratory, Upper     COVID-19 PCR Not Detected Not Detected     Imaging:  Xr Chest Ap Portable (1 View)    Result Date: 06/24/2019  IMPRESSION:  Epidural catheter is present. NG/OG is removed. Upper abdominal surgical drains. Allowing for low lung volume the cardiomediastinal silhouette is stable. Mild interstitial pulmonary edema and vascular congestion. Interval increase in small left more than right bilateral pleural effusions with associated atelectasis/consolidation. Decrease in left lateral chest wall subcutaneous emphysema. Signed by: Floy Sabina   06/24/2019 11:42 AM    Xr Kub Portable (1 View)    Result Date: 06/20/2019  IMPRESSION: Moderate stool burden. Normal bowel gas pattern. No intraperitoneal free air. No acute osseous abnormality. I, Quentin Cornwall, M.D., have reviewed this radiological study personally and I am in full agreement with the findings of the report presented here. Dictated by: Carolina Cellar   06/20/2019 4:12 PM Signed by: Quentin Cornwall   06/20/2019 4:31 PM    Xr Chest For Feeding Tube Placement (1 View)    Result Date: 06/22/2019  IMPRESSION: Epidural catheter is present. An enteric tube coursing below diaphragm with the tip projecting over mid gastric body. Upper abdominal surgical drains. Allowing for low lung volume the cardiomediastinal silhouette is stable. Mild interstitial pulmonary edema and vascular congestion. Left chest and abdominal wall moderate subcutaneous emphysema. Signed by: Benjamine Sprague   06/22/2019 9:12 AM    Assessment/Plan:  Donna Gross is a 53 y.o. female with multiple prior surgeries that stemmed from recurrent peptic ulcer disease for which she now presents. She is now 7 Days Post-Op s/p scheduled laparoscopic vagotomy and antrectomy, gastrojejunostomy, and hiatal hernia repair (06/21/19) c/b aspiration during induction of anesthesia for which patient is on empiric IV abx. Pts O2 requirements have continued to decrease in post-operative setting. CXR (06/24/19) shows mild pleural effusions with atelectasis as well as pulmonary congestion, but no focal consolidations or infiltrates to suggest pneumonia/pneumonitis. Mild post-op AKI resolved. Patient is otherwise recovering well without acute issues.     #Neuro  - Pain Management: Scheduled oxy 20 mg q8h, oxy sliding scale 10/05/19 q4h, Dilaudid 0.4mg  q6h prn.  - Consult chronic pain if patient not plugged in for assistance with outpatient pain management/weaning  - Activity as tolerated    #Pulmonary   - Pulm Toileting: Incentive spirometer   - Consult RT to evaluate given ongoing O2 requirements and pt does not use O2 at home    #GI  - Bowel regimen: colace BID, miralax BID, senna qPM  - Ulcer ppx: Protonix 40mg  PO QDay  - Nutrition orders: Diet mechanical soft    #GU:   - voiding sponataneously  - Daily BMP   - Replete electrolytes prn    #ID/ Heme: Pt had large aspiration event during induction of anesthesia prior to surgery. She was treated with Zosyn (7/1-6) empirically to prevent pneumonitis/pneumonia.  - Daily CBC    #Ext:  - monitor raised, painful bump on midline upper back  - give Aquaphor for pruritic zones on shoulders and calves  - monitor numbness on back of L hand -  zofran PRN itching as regular Nalbuphine for itching is not available due to shortage    #PPx   - SCDs  - OOB to chair today and walking as tolerated    #Disposition  - D/C home when tolerating regular diet and pain is controlled on PO pain regimen.  - No home health needs anticipated.  - Please page [phone] (surgery, [place] [facility]) with any questions.  - Code Status: Full Code    Patient seen with General Surgery Team and plan of care discussed with Attending, Alonna Buckler., MD, who is in agreement.     Author:    Katrinka Blazing. Ryonna Cimini, NP  8:05 AM 06/28/2019  Division of General Surgery  [facility]

## 2019-06-29 ENCOUNTER — Ambulatory Visit: Payer: PRIVATE HEALTH INSURANCE

## 2019-06-29 LAB — Magnesium: MAGNESIUM: 1.7 meq/L (ref 1.4–1.9)

## 2019-06-29 LAB — Basic Metabolic Panel
CREATININE: 1.15 mg/dL (ref 0.60–1.30)
GFR ESTIMATE FOR AFRICAN AMERICAN: 62 mL/min/{1.73_m2} (ref 3.6–5.3)

## 2019-06-29 LAB — Phosphorus: PHOSPHORUS: 4.2 mg/dL (ref 2.3–4.4)

## 2019-06-29 LAB — CBC: ABSOLUTE NUCLEATED RBC COUNT: 0 10*3/uL (ref 0.00–0.00)

## 2019-06-29 LAB — Tissue Exam

## 2019-06-29 MED ADMIN — IPRATROPIUM-ALBUTEROL 20-100 MCG/ACT IN AERS: 1 | RESPIRATORY_TRACT | @ 19:00:00 | Stop: 2019-06-29

## 2019-06-29 MED ADMIN — IPRATROPIUM BROMIDE 0.02 % IN SOLN: 500 ug | RESPIRATORY_TRACT | @ 16:00:00 | Stop: 2019-06-30 | NDC 00487980101

## 2019-06-29 MED ADMIN — ACETAMINOPHEN 500 MG PO TABS: 1000 mg | ORAL | @ 16:00:00 | Stop: 2019-06-29

## 2019-06-29 MED ADMIN — AQUAPHOR EX OINT: TOPICAL | @ 17:00:00 | Stop: 2019-07-03

## 2019-06-29 MED ADMIN — IPRATROPIUM BROMIDE 0.02 % IN SOLN: 500 ug | RESPIRATORY_TRACT | Stop: 2019-06-30 | NDC 00487980101

## 2019-06-29 MED ADMIN — QUETIAPINE FUMARATE 100 MG PO TABS: 100 mg | ORAL | @ 16:00:00 | Stop: 2019-07-03

## 2019-06-29 MED ADMIN — HYDROMORPHONE HCL 1 MG/ML IJ SOLN: .2 mg | INTRAVENOUS | @ 15:00:00 | Stop: 2019-06-29 | NDC 00409128331

## 2019-06-29 MED ADMIN — IPRATROPIUM-ALBUTEROL 20-100 MCG/ACT IN AERS: 1 | RESPIRATORY_TRACT | Stop: 2019-07-03

## 2019-06-29 MED ADMIN — AQUAPHOR EX OINT: TOPICAL | @ 11:00:00 | Stop: 2019-07-03

## 2019-06-29 MED ADMIN — METHOCARBAMOL 500 MG PO TABS: 1000 mg | ORAL | @ 06:00:00 | Stop: 2019-06-29 | NDC 70010075401

## 2019-06-29 MED ADMIN — ALBUTEROL SULFATE 2.5 MG/0.5ML IN NEBU: 2.5 mg | RESPIRATORY_TRACT | @ 11:00:00 | Stop: 2019-06-30 | NDC 00487990130

## 2019-06-29 MED ADMIN — PANTOPRAZOLE SODIUM 40 MG PO TBEC: 40 mg | ORAL | @ 16:00:00 | Stop: 2019-06-29

## 2019-06-29 MED ADMIN — ALBUTEROL SULFATE 2.5 MG/0.5ML IN NEBU: 2.5 mg | RESPIRATORY_TRACT | @ 19:00:00 | Stop: 2019-06-30

## 2019-06-29 MED ADMIN — OXYCODONE HCL 5 MG PO TABS: 10 mg | ORAL | @ 06:00:00 | Stop: 2019-06-29

## 2019-06-29 MED ADMIN — ALUM & MAG HYDROXIDE-SIMETH 400-400-40 MG/5ML PO SUSP: 10 mL | ORAL | @ 20:00:00 | Stop: 2019-06-29 | NDC 00121176230

## 2019-06-29 MED ADMIN — IPRATROPIUM BROMIDE 0.02 % IN SOLN: 500 ug | RESPIRATORY_TRACT | @ 19:00:00 | Stop: 2019-06-30

## 2019-06-29 MED ADMIN — OXYCODONE HCL 5 MG PO TABS: 20 mg | ORAL | @ 04:00:00 | Stop: 2019-06-29 | NDC 00406055262

## 2019-06-29 MED ADMIN — IPRATROPIUM-ALBUTEROL 20-100 MCG/ACT IN AERS: 1 | RESPIRATORY_TRACT | @ 01:00:00 | Stop: 2019-06-29 | NDC 00597002402

## 2019-06-29 MED ADMIN — LIDOCAINE 5 % EX PTCH: 1 | TRANSDERMAL | @ 01:00:00 | Stop: 2019-07-03 | NDC 00591352530

## 2019-06-29 MED ADMIN — AQUAPHOR EX OINT: TOPICAL | @ 01:00:00 | Stop: 2019-07-03

## 2019-06-29 MED ADMIN — LIDOCAINE VISCOUS HCL 2 % MT SOLN: 10 mL | ORAL | @ 20:00:00 | Stop: 2019-06-29 | NDC 50383077517

## 2019-06-29 MED ADMIN — LIDOCAINE 5 % EX PTCH: 1 | TRANSDERMAL | @ 13:00:00 | Stop: 2019-07-03

## 2019-06-29 MED ADMIN — OXYCODONE HCL 5 MG PO TABS: 10 mg | ORAL | @ 06:00:00 | Stop: 2019-06-29 | NDC 00406055262

## 2019-06-29 MED ADMIN — OXYCODONE HCL 5 MG PO TABS: 20 mg | ORAL | @ 10:00:00 | Stop: 2019-06-29 | NDC 00406055262

## 2019-06-29 MED ADMIN — HYDROMORPHONE HCL 1 MG/ML IJ SOLN: .4 mg | INTRAVENOUS | @ 23:00:00 | Stop: 2019-07-02 | NDC 00409128331

## 2019-06-29 MED ADMIN — IOHEXOL 350 MG/ML IV SOLN: 125 mL | INTRAVENOUS | @ 16:00:00 | Stop: 2019-06-29 | NDC 00407141476

## 2019-06-29 MED ADMIN — DOCUSATE SODIUM 100 MG PO CAPS: 100 mg | ORAL | @ 04:00:00 | Stop: 2019-06-29 | NDC 00904645561

## 2019-06-29 MED ADMIN — OXYCODONE HCL 5 MG/5ML PO SOLN: 10 mg | ORAL | @ 20:00:00 | Stop: 2019-06-29

## 2019-06-29 MED ADMIN — ALBUTEROL SULFATE 2.5 MG/0.5ML IN NEBU: 2.5 mg | RESPIRATORY_TRACT | Stop: 2019-06-30 | NDC 00487990130

## 2019-06-29 MED ADMIN — AQUAPHOR EX OINT: TOPICAL | @ 21:00:00 | Stop: 2019-07-03

## 2019-06-29 MED ADMIN — HYDROMORPHONE HCL 1 MG/ML IJ SOLN: .4 mg | INTRAVENOUS | @ 11:00:00 | Stop: 2019-06-29 | NDC 00409128331

## 2019-06-29 MED ADMIN — POLYETHYLENE GLYCOL 3350 17 G PO PACK: 17 g | ORAL | @ 04:00:00 | Stop: 2019-06-29 | NDC 00904693181

## 2019-06-29 MED ADMIN — SENNOSIDES 8.6 MG PO TABS: 1 | ORAL | @ 04:00:00 | Stop: 2019-06-29 | NDC 00904652261

## 2019-06-29 MED ADMIN — MENTHOL 5 MG MT LOZG: 5 mg | OROMUCOSAL | @ 15:00:00 | Stop: 2019-07-03 | NDC 41528000066

## 2019-06-29 MED ADMIN — IPRATROPIUM BROMIDE 0.02 % IN SOLN: 500 ug | RESPIRATORY_TRACT | @ 06:00:00 | Stop: 2019-06-30 | NDC 00487980101

## 2019-06-29 MED ADMIN — ALBUTEROL SULFATE 2.5 MG/0.5ML IN NEBU: 2.5 mg | RESPIRATORY_TRACT | @ 16:00:00 | Stop: 2019-06-30 | NDC 00487990130

## 2019-06-29 MED ADMIN — ACETAMINOPHEN 10 MG/ML IV SOLN: 1000 mg | INTRAVENOUS | @ 23:00:00 | Stop: 2019-06-30 | NDC 43825010201

## 2019-06-29 MED ADMIN — DEXTROSE 5 %/0.45 % NACL: 50 mL/h | INTRAVENOUS | @ 23:00:00 | Stop: 2019-07-03 | NDC 00338008504

## 2019-06-29 MED ADMIN — OXYCODONE HCL 5 MG/5ML PO SOLN: 10 mg | ORAL | @ 16:00:00 | Stop: 2019-06-29

## 2019-06-29 MED ADMIN — POLYETHYLENE GLYCOL 3350 17 G PO PACK: 17 g | ORAL | @ 16:00:00 | Stop: 2019-06-29

## 2019-06-29 MED ADMIN — QUETIAPINE FUMARATE 100 MG PO TABS: 100 mg | ORAL | @ 04:00:00 | Stop: 2019-07-03 | NDC 00904664061

## 2019-06-29 MED ADMIN — ACETAMINOPHEN 500 MG PO TABS: 1000 mg | ORAL | @ 07:00:00 | Stop: 2019-06-29

## 2019-06-29 MED ADMIN — METHOCARBAMOL 500 MG PO TABS: 1000 mg | ORAL | @ 11:00:00 | Stop: 2019-06-29

## 2019-06-29 MED ADMIN — DOCUSATE SODIUM 100 MG PO CAPS: 100 mg | ORAL | @ 16:00:00 | Stop: 2019-06-29

## 2019-06-29 MED ADMIN — PANTOPRAZOLE SODIUM 40 MG IV SOLR: 40 mg | INTRAVENOUS | @ 23:00:00 | Stop: 2019-06-30 | NDC 00143928410

## 2019-06-29 MED ADMIN — IPRATROPIUM-ALBUTEROL 20-100 MCG/ACT IN AERS: 1 | RESPIRATORY_TRACT | @ 12:00:00 | Stop: 2019-06-29

## 2019-06-29 MED ADMIN — METHOCARBAMOL 500 MG PO TABS: 1000 mg | ORAL | @ 21:00:00 | Stop: 2019-06-29

## 2019-06-29 MED ADMIN — FLUTICASONE-SALMETEROL 250-50 MCG/DOSE IN AEPB: 1 | RESPIRATORY_TRACT | @ 05:00:00 | Stop: 2019-07-03

## 2019-06-29 MED ADMIN — OXYCODONE HCL 5 MG/5ML PO SOLN: 20 mg | ORAL | @ 15:00:00 | Stop: 2019-06-29 | NDC 66689040150

## 2019-06-29 MED ADMIN — HYDROMORPHONE HCL 1 MG/ML IJ SOLN: .4 mg | INTRAVENOUS | @ 05:00:00 | Stop: 2019-06-29 | NDC 00409128331

## 2019-06-29 MED ADMIN — OXYCODONE HCL 5 MG PO TABS: 10 mg | ORAL | @ 01:00:00 | Stop: 2019-06-29 | NDC 00406055262

## 2019-06-29 MED ADMIN — FLUTICASONE-SALMETEROL 250-50 MCG/DOSE IN AEPB: 1 | RESPIRATORY_TRACT | @ 16:00:00 | Stop: 2019-07-03 | NDC 00378932132

## 2019-06-29 MED ADMIN — HYDROMORPHONE HCL 1 MG/ML IJ SOLN: .4 mg | INTRAVENOUS | @ 17:00:00 | Stop: 2019-06-29 | NDC 00409128331

## 2019-06-29 MED ADMIN — METHOCARBAMOL 500 MG PO TABS: 1000 mg | ORAL | @ 01:00:00 | Stop: 2019-06-29 | NDC 70010075401

## 2019-06-29 MED ADMIN — AQUAPHOR EX OINT: TOPICAL | @ 20:00:00 | Stop: 2019-07-03

## 2019-06-29 MED ADMIN — ENOXAPARIN SODIUM 40 MG/0.4ML SC SOLN: 40 mg | SUBCUTANEOUS | @ 04:00:00 | Stop: 2019-06-29 | NDC 60505079204

## 2019-06-29 MED ADMIN — ALBUTEROL SULFATE 2.5 MG/0.5ML IN NEBU: 2.5 mg | RESPIRATORY_TRACT | @ 06:00:00 | Stop: 2019-06-30 | NDC 00487990130

## 2019-06-29 MED ADMIN — IPRATROPIUM BROMIDE 0.02 % IN SOLN: 500 ug | RESPIRATORY_TRACT | @ 11:00:00 | Stop: 2019-06-30 | NDC 00487980101

## 2019-06-29 MED ADMIN — OXYCODONE HCL 5 MG PO TABS: 10 mg | ORAL | @ 11:00:00 | Stop: 2019-06-29 | NDC 00406055262

## 2019-06-29 MED ADMIN — AQUAPHOR EX OINT: TOPICAL | @ 04:00:00 | Stop: 2019-07-03

## 2019-06-29 MED ADMIN — OXYCODONE HCL 5 MG PO TABS: 10 mg | ORAL | @ 11:00:00 | Stop: 2019-06-29

## 2019-06-29 NOTE — Progress Notes
06/28/19 1753   Time Calculation   Start Time 1130   Patient not seen due to Patient deferred treatment   Pt declined PT treatment citing that this was not a good time.  Encouraged pt to participate and explained importance of mobility but pt continued to decline.  Will follow up as schedule permits.

## 2019-06-29 NOTE — Progress Notes
INPATIENT H&P / GENERAL SURGERY / [facility] [phone]    PATIENT:  Donna Gross  MRN:  [id number]  DOB:  08-26-1966  DATE OF SERVICE:  06/29/2019    GENERAL SURGERY ATTENDING PHYSICIAN:  Dr. [name]  PRIMARY CARE PHYSICIAN:  [facility]    HPI:  Deisha Mosby is a 53 y.o. female with complex peptic ulcer history who presented to ED with 3 days of N/V and abdominal pain on 06/20/19. She was admitted the night before scheduled surgery. She is s/p scheduled laparoscopic vagotomy and antrectomy, gastrojejunostomy, and hiatal hernia repair (06/21/19).     Interval Events/Subjective:    06/21/19: Pt with nausea but no emesis overnight.  Has c/o of constant abdominal pain. Pt has been NPO for surgery.  06/22/19: NGT left in place after surgery. Patient enduring moderate post-operative pain. Was evaluated without evidence of acute pathology, bleed, or infection. Started on Dilaudid prn for post-operative pain (has chronic pain and moderate opioid requirements at home) in addition to existing PCEA. Patient continues to have pain this morning that is under improved control. Denies nausea or vomiting.  06/23/19: Required 500cc NS bolus x 2 yesterday for rising Cr and SBPs to low 100s. Also started IV tylenol and Dilaudid PCA for pain control. Pt reports pain well controlled this morning. Denies N/V, fevers, or chills. Requiring nasal cannula to maintain O2 sats>92%, but pt denies SOB, respiratory distress, or chest pain.  06/24/19: No acute events. Pulled NGT this morning herself confusing it for nasal cannula. NGT not replaced. Denies passing gas or having BM. O2 requirements decreasing, on 1L NC as of this AM. Complained of itching yesterdayrequiring IV Benadryl x 2. Denies fevers, chills, N/V, or new/worsening abdominal pain other than incisional tenderness.  06/25/19: No acute events. Denies N/V. Denies BM or flatus. Reports abdominal pain remains severe but is improving overall and well controlled with current PCA. Reports sleeping for 2 hrs at a time overnight, waking in 10/10 pain, using PCA with relief. Reports bilateral shoulder and calf pruritis. Reports extremely painful bump on midline upper back. Reports new onset numbness on back of L hand beginning this morning, located around area of IV trials. Reports feeling hungry and is enthusiastic about returning to eating.  06/26/19: No acute events. Denies N/V. Tolerated 500cc/shift clears yesterday without issue. Now passing gas, up and walking, no bowel movements. Continues to have abdominal pain, improved overall. Remains on Dilaudid PCA.  06/27/19: NAEON. Pt slept well with restart of home seroquel. Walking with PT. Passing gas. No BM yesterday. Pain is stable. Pt continues to be on nasal cannula, reports she does not use oxygen at home. Does use nebulized breathing treatments at home prn.  06/28/19: NAEON. Pt denies N/V. Tolerating mech soft diet.  Passing flatus. No BM last 24 hours.  Cont to endorse incisional pain.   06/29/19: NAEON. AFVSS on 2L NC. No respiratory distress. New complaint of difficulty swallowing, pain with swallowing and chest/epigastric pain. Reports only occurs with swallowing, particularly food/liquids but not saliva. Denies chest pressure, burning, radiation of pain, regurgitation, acid reflux symptoms. Refusing PO meds and only wants IV meds and IVF. Crying, frustrated. Otherwise, abdominal pain continues to improve.     Temp:  [36.9 ???C (98.4 ???F)-37.7 ???C (99.8 ???F)] 37.5 ???C (99.5 ???F)  Heart Rate:  [78-108] 108  Resp:  [16-19] 16  BP: (90-122)/(66-70) 122/66  NBP Mean:  [75-82] 82  SpO2:  [93 %-95 %] 93 %  I/O last 3 completed shifts:  In: 600 [P.O.:600]  Out: 1860 [Urine:1850; Drains:10]    Diets/Supplements/Feeds   Diet    Diet blenderized liquid     Start Date/Time: 06/29/19 4540      Number of Occurrences: Until Specified       Scheduled Meds:  ??? acetaminophen  1,000 mg Oral Q8H   ??? albuterol  2.5 mg Nebulization Q4H ??? docusate  100 mg Oral BID   ??? enoxaparin  40 mg Subcutaneous QHS   ??? fluticasone-salmeterol  1 puff Inhalation BID   ??? [COMPLETED] iohexol  125 mL Intravenous Once   ??? ipratropium  0.5 mg Nebulization Q4H   ??? ipratropium-albuterol  1 puff Inhalation QID   ??? lidocaine  1 patch Transdermal Q24H   ??? methocarbamol  1,000 mg Oral QID   ??? mineral oil-hydrophilic petrolatum   Topical 6x Daily   ??? oxyCODONE  10 mg Oral Q4H   ??? pantoprazole  40 mg Oral Daily   ??? polyethylene glycol  17 g Oral BID   ??? QUEtiapine  100 mg Oral BID   ??? senna  1 tablet Oral QHS       PRN Meds:  HYDROmorphone, menthol, [DISCONTINUED] HYDROmorphone **AND** [DISCONTINUED] sodium chloride **AND** naloxone, ondansetron, oxyCODONE **OR** oxyCODONE **OR** oxyCODONE    Exam:  Current BP 122/66  ~ Pulse (!) 108  ~ Temp 37.5 ???C (99.5 ???F) (Axillary)  ~ Resp 16  ~ Ht 1.626 m (5' 4'')  ~ Wt 67.4 kg (148 lb 9.4 oz)  ~ SpO2 93%  ~ BMI 25.51 kg/m???   Gen: NAD  HEENT: NCAT, EOMI, MMM, neck supple. No NGT in place.   CV: RRR, nl s1 s2  Pulm: Normal respiratory excursions, not tachypneic, no labored breathing.   Abd: Soft, non-distended. Multiple prior incisions, feeding tube sites, drain sites. Has mild expected incisional tenderness, and diffuse mild abdominal tenderness. Incisions clean/dry/intact. Has right abdominal JP drain with scant serosanguineous output.  GU: Voiding spontaneously  Ext: warm, well perfused, no cyanosis, clubbing, or edema. Raised, extremely tender, non-erythematous 2cm diameter bump with central scab on upper midline back at site of previous attempted epidural placement. No rash or excoriations on shoulders. Dry skin and non-bleeding excoriations on bilateral calves. Numbness on back of L hand, between MCP joint line and wrist, extending full width of hand. Moving legs well bilaterally.  Neuro: A&Ox3  Drains: abdominal JPx1 on right with serosanguineous output.  Output by Drain (mL) 06/27/19 0701 - 06/27/19 1900 06/27/19 1901 - 06/28/19 0700 06/28/19 0701 - 06/28/19 1900 06/28/19 1901 - 06/29/19 0700 06/29/19 0701 - 06/29/19 0931   Surgical Drain 1 Right Abdomen JP 10 3  7          Labs:  Recent Labs     06/29/19  0458 06/28/19  0444 06/27/19  0727   HGB 8.1* 8.1* 8.2*   HCT 26.0* 25.9* 25.9*   PLT 593* 513* 440*     No results for input(s): APTT, PT, INR in the last 72 hours.    Recent Labs     06/29/19  0458 06/28/19  0444 06/27/19  0727   CREAT 1.15 0.86 0.73   BUN 14 9 5*   NA 140 141 141   K 4.6 3.9 3.5*   CL 101 102 102   CO2 23 25 26    GLUCOSE 107* 77 80   CALCIUM 8.9 8.8 8.8     Recent Labs     06/29/19  1610 06/28/19  0444 06/27/19  0727   MG 1.7 1.6 1.6   PHOS 4.2 3.7 3.2     Recent Labs     06/29/19  0458 06/28/19  0444 06/27/19  0727   GLUCOSE 107* 77 80       No results for input(s): TOTPRO, ALBUMIN in the last 72 hours.  No results for input(s): PREALBUMIN in the last 72 hours.   No results for input(s): BILITOT, BILICON, AST, ALT, ALKPHOS, LDH in the last 72 hours.  No results for input(s): INR in the last 72 hours.  No results for input(s): AMYLASE, LIPASE in the last 72 hours.    Micro:   Recent Labs     06/29/19  0458 06/28/19  0444 06/27/19  0727   WBC 11.94* 8.30 7.86       Recent Results (from the past 336 hour(s))   COVID-19 PCR, Nasopharyngeal    Collection Time: 06/19/19  3:47 PM   Result Value Ref Range    Specimen Type Respiratory, Upper     COVID-19 PCR Not Detected Not Detected     Imaging:  Ct Abd+pelvis W Contrast    Result Date: 06/29/2019  IMPRESSION: Postsurgical changes. No drainable fluid collections demonstrated. No bowel obstruction. There is a small globular hyperdensity noted adjacent to the gastroesophageal junction, indeterminate origin but probably postsurgical material. Signed by: Billee Cashing   06/29/2019 9:26 AM    Xr Chest Ap Portable (1 View)    Result Date: 06/24/2019  IMPRESSION:  Epidural catheter is present. NG/OG is removed. Upper abdominal surgical drains. Allowing for low lung volume the cardiomediastinal silhouette is stable. Mild interstitial pulmonary edema and vascular congestion. Interval increase in small left more than right bilateral pleural effusions with associated atelectasis/consolidation. Decrease in left lateral chest wall subcutaneous emphysema. Signed by: Floy Sabina   06/24/2019 11:42 AM    Xr Kub Portable (1 View)    Result Date: 06/20/2019  IMPRESSION: Moderate stool burden. Normal bowel gas pattern. No intraperitoneal free air. No acute osseous abnormality. I, Quentin Cornwall, M.D., have reviewed this radiological study personally and I am in full agreement with the findings of the report presented here. Dictated by: Carolina Cellar   06/20/2019 4:12 PM Signed by: Quentin Cornwall   06/20/2019 4:31 PM    Xr Chest For Feeding Tube Placement (1 View)    Result Date: 06/22/2019  IMPRESSION: Epidural catheter is present. An enteric tube coursing below diaphragm with the tip projecting over mid gastric body. Upper abdominal surgical drains. Allowing for low lung volume the cardiomediastinal silhouette is stable. Mild interstitial pulmonary edema and vascular congestion. Left chest and abdominal wall moderate subcutaneous emphysema. Signed by: Benjamine Sprague   06/22/2019 9:12 AM    Assessment/Plan:  Phoenix Wilkie is a 53 y.o. female with multiple prior surgeries that stemmed from recurrent peptic ulcer disease for which she now presents. She is now 8 Days Post-Op s/p scheduled laparoscopic vagotomy and antrectomy, gastrojejunostomy, and hiatal hernia repair (06/21/19) c/b aspiration during induction of anesthesia for which patient is on empiric IV abx. Pts O2 requirements have continued to decrease in post-operative setting. CXR (06/24/19) shows mild pleural effusions with atelectasis as well as pulmonary congestion, but no focal consolidations or infiltrates to suggest pneumonia/pneumonitis. Mild post-op AKI resolved. Pt has been recovering well but with new chest/epigastric pain with swallowing that is of unclear etiology and slight leukocytosis that is new.     #Neuro  - Pain Management: Scheduled oxy 10  mg q4h, oxy sliding scale 10/05/19 q4h, Dilaudid 0.4mg  q6h prn.  - Consult chronic pain if patient not plugged in for assistance with outpatient pain management/weaning  - Activity as tolerated    #Cardiac  - ECG for new onset chest pain    #Pulmonary   - Pulm Toileting: Incentive spirometer   - duonebs, combivent QID, per RT    #GI  - Bowel regimen: colace BID, miralax BID, senna qPM  - Ulcer ppx: Protonix 40mg  PO QDay  - Nutrition orders: Diet blenderized liquid   - CT Abd/pelvis w/contrast this AM to evaluate new abdominal pain and rising mild leukocytosis    #GU:   - voiding sponataneously  - Daily BMP   - Replete electrolytes prn    #ID/ Heme:  #Acute blood loss anemia, No POA   Pt had large aspiration event during induction of anesthesia prior to surgery. She was treated with Zosyn (7/1-6) empirically to prevent pneumonitis/pneumonia.  - Daily CBC    #Ext:  - monitor raised, painful bump on midline upper back  - give Aquaphor for pruritic zones on shoulders and calves  - monitor numbness on back of L hand  - zofran PRN itching as regular Nalbuphine for itching is not available due to shortage    #PPx   - SCDs  - OOB to chair today and walking as tolerated    #Disposition  - D/C home when tolerating regular diet and pain is controlled on PO pain regimen.  - No home health needs anticipated.  - Please page [phone] (surgery, [place] team) with any questions.  - Code Status: Full Code    Patient seen with General Surgery Team and plan of care discussed with Attending, Alonna Buckler., MD, who is in agreement.     Author:    Remi Deter, MD  9:31 AM 06/29/2019  Division of General Surgery  [facility]

## 2019-06-29 NOTE — Other
Patients Clinical Goal: pain management  Clinical Goal(s) for the Shift: VSS, hemodynamic stability, fall prevention, wound care, drain care, fall prevention, pain management, rest, anxiety management  Identify possible barriers to advancing the care plan: pain management  Stability of the patient: Moderately Stable - low risk of patient condition declining or worsening   End of Shift Summary:   Diagnosis  LAPAROSCOPY ; VAGOTOMY AND ANTRECTOMY, GASTROJEJUNOSTOMY AND HIATAL HERNIA REPAIR     Procedures/Test/Consults  8.30>8.1/25.9<513  K3.9  BUN 9  Cr 0.86  Mg 1.6    Review of Systems:     Neuro: A/O x 4, PERLA, no neuro deficits   HEENT:wdl  CV: Nonmonitored. Denies CP/vertigo/palpitations pulses +2, V/S wnl  Resp: 2L O2 via NC sat >95%.  Denies any SOB, course lungs bilaterally       OVERVIEW OF VITALS:BP 90/67  ~ Pulse 82  ~ Temp 36.9 C (98.4 F) (Oral)  ~ Resp 18  ~ Ht 1.626 m (5' 4'')  ~ Wt 67.4 kg (148 lb 9.4 oz)  ~ SpO2 93%  ~ BMI 25.51 kg/m     GI: Continent. Last BM _07/04/2019  Abdominal pain uncontrolled, c/o nausea occasionally nausea, Tolerating PO intake        Diet; Mechanical soft   GU: Continent, UOP wdl  Musc: generalized weakness  Pain: pain managed by BTP IVP Dilaudid (patient prefers), oxycodone 10mg /methocarbamol/tylenol scheduled and prn oxycodone po  BMAT: 4  Skin: WDL    Lines/Drains  PIV 22g RW  JP R abdomen 5cc output       Plan of care DW pt/family  Fall prevention, pain management, wound care, toileting, encourage activity

## 2019-06-29 NOTE — Other
Patients Clinical Goal:   Clinical Goal(s) for the Shift: VSS: Pain well Controlled.   Identify possible barriers to advancing the care plan:Pain  Stability of the patient: Moderately Unstable - medium risk of patient condition declining or worsening    End of Shift Summary: Received patient from day shift RN. Patient is currently in no acute distress, pain moderately controlled on currently pain regimen. Patient started complaining of pain in chest area while swallowing when taking her PO medications this AM. MD notified and orders placed for lozenge. JP drain with minimal output. Continuous pulse oximetry. Started on nebs this shift.Will continue to monitor the patient for safety and stability.

## 2019-06-30 ENCOUNTER — Telehealth: Payer: PRIVATE HEALTH INSURANCE

## 2019-06-30 LAB — Basic Metabolic Panel
GFR ESTIMATE FOR AFRICAN AMERICAN: >89 mL/min/{1.73_m2} (ref 8–19)
GLUCOSE: 83 mg/dL (ref 65–99)

## 2019-06-30 LAB — Magnesium: MAGNESIUM: 1.6 meq/L (ref 1.4–1.9)

## 2019-06-30 LAB — CBC: WHITE BLOOD CELL COUNT: 8.92 10*3/uL (ref 4.16–9.95)

## 2019-06-30 LAB — Phosphorus: PHOSPHORUS: 3.8 mg/dL (ref 2.3–4.4)

## 2019-06-30 MED ADMIN — ALBUTEROL SULFATE 2.5 MG/0.5ML IN NEBU: 2.5 mg | RESPIRATORY_TRACT | @ 06:00:00 | Stop: 2019-06-30

## 2019-06-30 MED ADMIN — ALUM & MAG HYDROXIDE-SIMETH 400-400-40 MG/5ML PO SUSP: 10 mL | ORAL | @ 18:00:00 | Stop: 2019-07-03 | NDC 00121176230

## 2019-06-30 MED ADMIN — ACETAMINOPHEN 10 MG/ML IV SOLN: 1000 mg | INTRAVENOUS | @ 16:00:00 | Stop: 2019-06-30 | NDC 43825010201

## 2019-06-30 MED ADMIN — ACETAMINOPHEN 10 MG/ML IV SOLN: 1000 mg | INTRAVENOUS | @ 08:00:00 | Stop: 2019-06-30 | NDC 43825010201

## 2019-06-30 MED ADMIN — LIDOCAINE VISCOUS HCL 2 % MT SOLN: 10 mL | ORAL | @ 18:00:00 | Stop: 2019-06-30 | NDC 50383077517

## 2019-06-30 MED ADMIN — ACETAMINOPHEN 10 MG/ML IV SOLN: 1000 mg | INTRAVENOUS | @ 23:00:00 | Stop: 2019-07-01 | NDC 43825010201

## 2019-06-30 MED ADMIN — IPRATROPIUM BROMIDE 0.02 % IN SOLN: 500 ug | RESPIRATORY_TRACT | @ 06:00:00 | Stop: 2019-06-30

## 2019-06-30 MED ADMIN — HYDROMORPHONE HCL 1 MG/ML IJ SOLN: .4 mg | INTRAVENOUS | @ 20:00:00 | Stop: 2019-07-02 | NDC 00409128331

## 2019-06-30 MED ADMIN — LIDOCAINE 5 % EX PTCH: 1 | TRANSDERMAL | @ 12:00:00 | Stop: 2019-07-03

## 2019-06-30 MED ADMIN — HYDROMORPHONE HCL 1 MG/ML IJ SOLN: .4 mg | INTRAVENOUS | @ 08:00:00 | Stop: 2019-07-02 | NDC 00409128331

## 2019-06-30 MED ADMIN — HYDROMORPHONE HCL 1 MG/ML IJ SOLN: .4 mg | INTRAVENOUS | @ 16:00:00 | Stop: 2019-07-02 | NDC 00409128331

## 2019-06-30 MED ADMIN — HYDROMORPHONE HCL 1 MG/ML IJ SOLN: .4 mg | INTRAVENOUS | @ 04:00:00 | Stop: 2019-07-02 | NDC 00409128331

## 2019-06-30 MED ADMIN — FLUTICASONE-SALMETEROL 250-50 MCG/DOSE IN AEPB: 1 | RESPIRATORY_TRACT | @ 06:00:00 | Stop: 2019-07-03 | NDC 00378932132

## 2019-06-30 MED ADMIN — AQUAPHOR EX OINT: TOPICAL | @ 16:00:00 | Stop: 2019-07-03

## 2019-06-30 MED ADMIN — OXYCODONE HCL 5 MG/5ML PO SOLN: 20 mg | ORAL | @ 13:00:00 | Stop: 2019-07-03 | NDC 66689040150

## 2019-06-30 MED ADMIN — AQUAPHOR EX OINT: TOPICAL | @ 12:00:00 | Stop: 2019-07-03

## 2019-06-30 MED ADMIN — FLUTICASONE-SALMETEROL 250-50 MCG/DOSE IN AEPB: 1 | RESPIRATORY_TRACT | @ 18:00:00 | Stop: 2019-07-03 | NDC 00378932132

## 2019-06-30 MED ADMIN — QUETIAPINE FUMARATE 100 MG PO TABS: 100 mg | ORAL | @ 16:00:00 | Stop: 2019-07-03 | NDC 00904664061

## 2019-06-30 MED ADMIN — ONDANSETRON HCL 4 MG/2ML IJ SOLN: 4 mg | INTRAVENOUS | @ 23:00:00 | Stop: 2019-07-03 | NDC 60505613005

## 2019-06-30 MED ADMIN — LIDOCAINE 5 % EX PTCH: 1 | TRANSDERMAL | Stop: 2019-07-03 | NDC 00591352530

## 2019-06-30 MED ADMIN — AQUAPHOR EX OINT: TOPICAL | @ 22:00:00 | Stop: 2019-07-03

## 2019-06-30 MED ADMIN — AQUAPHOR EX OINT: TOPICAL | Stop: 2019-07-03

## 2019-06-30 MED ADMIN — AQUAPHOR EX OINT: TOPICAL | @ 19:00:00 | Stop: 2019-07-03

## 2019-06-30 MED ADMIN — PANTOPRAZOLE SODIUM 40 MG IV SOLR: 40 mg | INTRAVENOUS | @ 16:00:00 | Stop: 2019-07-03 | NDC 00143928410

## 2019-06-30 MED ADMIN — QUETIAPINE FUMARATE 100 MG PO TABS: 100 mg | ORAL | @ 05:00:00 | Stop: 2019-07-03

## 2019-06-30 MED ADMIN — HYDROMORPHONE HCL 1 MG/ML IJ SOLN: .4 mg | INTRAVENOUS | @ 12:00:00 | Stop: 2019-07-02 | NDC 00409128331

## 2019-06-30 MED ADMIN — OXYCODONE HCL 5 MG/5ML PO SOLN: 20 mg | ORAL | @ 18:00:00 | Stop: 2019-07-03 | NDC 66689040150

## 2019-06-30 MED ADMIN — METHOCARBAMOL IVPB: 750 mg | INTRAVENOUS | @ 16:00:00 | Stop: 2019-07-01 | NDC 55150022310

## 2019-06-30 MED ADMIN — OXYCODONE HCL 5 MG/5ML PO SOLN: 20 mg | ORAL | @ 22:00:00 | Stop: 2019-07-03 | NDC 66689040150

## 2019-06-30 MED ADMIN — CITRATE OF MAGNESIA PO SOLN: 148 mL | ORAL | @ 19:00:00 | Stop: 2019-06-30 | NDC 00869016638

## 2019-06-30 MED ADMIN — BENZOCAINE 20 % MT AERO: 1 | OROMUCOSAL | @ 22:00:00 | Stop: 2019-07-03 | NDC 00699310002

## 2019-06-30 MED ADMIN — IPRATROPIUM-ALBUTEROL 20-100 MCG/ACT IN AERS: 1 | RESPIRATORY_TRACT | @ 18:00:00 | Stop: 2019-07-03 | NDC 00597002402

## 2019-06-30 MED ADMIN — DEXTROSE 5 %/0.45 % NACL: 50 mL/h | INTRAVENOUS | @ 12:00:00 | Stop: 2019-07-03 | NDC 00338008504

## 2019-06-30 MED ADMIN — AQUAPHOR EX OINT: TOPICAL | @ 06:00:00 | Stop: 2019-07-03

## 2019-06-30 MED ADMIN — IPRATROPIUM-ALBUTEROL 20-100 MCG/ACT IN AERS: 1 | RESPIRATORY_TRACT | @ 06:00:00 | Stop: 2019-07-03 | NDC 00597002402

## 2019-06-30 MED ADMIN — BISACODYL 10 MG RE SUPP: 10 mg | RECTAL | @ 18:00:00 | Stop: 2019-07-01

## 2019-06-30 MED ADMIN — ENOXAPARIN SODIUM 40 MG/0.4ML SC SOLN: 40 mg | SUBCUTANEOUS | @ 03:00:00 | Stop: 2019-07-03 | NDC 60505079204

## 2019-06-30 NOTE — Telephone Encounter
Post Op Follow up Phone Call     Patient is s/p surgery. The patient's pain is well controlled with oxycodone, weaning off Narcotics. Eating and drinking today. Denies Nausea or emesis. Passing flatus, last BM today. Urinating without any problems. Afebrile.    Incisions: Clean, dry and intact.     Drains: n/a    Concerns: The patients concerns were addressed and will call back for any additional questions.     Future Appointments   Date Time Provider [place]   07/14/2019  1:30 PM Mardene Sayer., MD SUR GEN 102 SURGERY       All questions and concerns were answered, and the patient verbalized understanding.       Peterson [place], [place]  General Surgery

## 2019-06-30 NOTE — Progress Notes
06/29/19 1729   Time Calculation   Start Time 1435   Pt declined initial tx visit at 11:30am.  She stated that she was not benefiting from her tx sessions and having more pain as a result of it but he agreed to consider session in the afternoon.  When we returned in the afternoon, she refused to participate.  She also noted that the MD was aware of her pain.

## 2019-06-30 NOTE — Nursing Note
Pt A&Ox4  Unable to wean 02, sats 88% on RA, 92-95%on 2L 02  Encouraged IS use  Not monitored, tachy at times  Not wanting to eat meals today due to pain in throat / chest, given lidocaine and antacid elixir as ordered. Pt states minimal relief from this, declining PO meds.   EKG and CT abdo / pelvis done  Voiding no issues  Last BM 06/27/19  Lap sites to abdomen + JP x 1 with no output, otherwise skin intact  Requesting IV dilaudid often, frequency changed from q6hr to q4hr    Visited by sister today

## 2019-06-30 NOTE — Progress Notes
INPATIENT H&P / GENERAL SURGERY / [facility] [phone]    PATIENT:  Donna Gross  MRN:  [id number]  DOB:  1966/09/24  DATE OF SERVICE:  06/30/2019    GENERAL SURGERY ATTENDING PHYSICIAN:  Dr. [name]  PRIMARY CARE PHYSICIAN:  [facility], MD    HPI:  Donna Gross is a 53 y.o. female with complex peptic ulcer history who presented to ED with 3 days of N/V and abdominal pain on 06/20/19. She was admitted the night before scheduled surgery. She is s/p scheduled laparoscopic vagotomy and antrectomy, gastrojejunostomy, and hiatal hernia repair (06/21/19).     Interval Events/Subjective:    06/21/19: Pt with nausea but no emesis overnight.  Has c/o of constant abdominal pain. Pt has been NPO for surgery.  06/22/19: NGT left in place after surgery. Patient enduring moderate post-operative pain. Was evaluated without evidence of acute pathology, bleed, or infection. Started on Dilaudid prn for post-operative pain (has chronic pain and moderate opioid requirements at home) in addition to existing PCEA. Patient continues to have pain this morning that is under improved control. Denies nausea or vomiting.  06/23/19: Required 500cc NS bolus x 2 yesterday for rising Cr and SBPs to low 100s. Also started IV tylenol and Dilaudid PCA for pain control. Pt reports pain well controlled this morning. Denies N/V, fevers, or chills. Requiring nasal cannula to maintain O2 sats>92%, but pt denies SOB, respiratory distress, or chest pain.  06/24/19: No acute events. Pulled NGT this morning herself confusing it for nasal cannula. NGT not replaced. Denies passing gas or having BM. O2 requirements decreasing, on 1L NC as of this AM. Complained of itching yesterdayrequiring IV Benadryl x 2. Denies fevers, chills, N/V, or new/worsening abdominal pain other than incisional tenderness.  06/25/19: No acute events. Denies N/V. Denies BM or flatus. Reports abdominal pain remains severe but is improving overall and well controlled with current PCA. Reports sleeping for 2 hrs at a time overnight, waking in 10/10 pain, using PCA with relief. Reports bilateral shoulder and calf pruritis. Reports extremely painful bump on midline upper back. Reports new onset numbness on back of L hand beginning this morning, located around area of IV trials. Reports feeling hungry and is enthusiastic about returning to eating.  06/26/19: No acute events. Denies N/V. Tolerated 500cc/shift clears yesterday without issue. Now passing gas, up and walking, no bowel movements. Continues to have abdominal pain, improved overall. Remains on Dilaudid PCA.  06/27/19: NAEON. Pt slept well with restart of home seroquel. Walking with PT. Passing gas. No BM yesterday. Pain is stable. Pt continues to be on nasal cannula, reports she does not use oxygen at home. Does use nebulized breathing treatments at home prn.  06/28/19: NAEON. Pt denies N/V. Tolerating mech soft diet.  Passing flatus. No BM last 24 hours.  Cont to endorse incisional pain.   06/29/19: NAEON. AFVSS on 2L NC. No respiratory distress. New complaint of difficulty swallowing, pain with swallowing and chest/epigastric pain. Reports only occurs with swallowing, particularly food/liquids but not saliva. Denies chest pressure, burning, radiation of pain, regurgitation, acid reflux symptoms. Refusing PO meds and only wants IV meds and IVF. Crying, frustrated. Otherwise, abdominal pain continues to improve.   06/30/19: NAEO. Pt c/o of continued pain with swallowing with mild improvement with viscous lidocaine and hurricane spray.  Pt is overwhelmed and frustrated with care, nursing, and PT.      Temp:  [36.4 ???C (97.6 ???F)-37.6 ???C (99.6 ???F)] 36.7 ???C (98 ???F)  Heart Rate:  [68-109] 80  Resp:  [15-17] 16  BP: (116-143)/(77-86) 125/79  NBP Mean:  [89-104] 94  SpO2:  [89 %-97 %] 93 %       I/O last 3 completed shifts:  In: 668.3 [I.V.:668.3]  Out: 2307 [Urine:2300; Drains:7]    Diets/Supplements/Feeds   Diet Diet blenderized liquid     Start Date/Time: 06/29/19 1610      Number of Occurrences: Until Specified       Scheduled Meds:  ??? acetaminophen  1,000 mg Intravenous Q8H   ??? acetaminophen  1,000 mg Intravenous Q8H   ??? enoxaparin  40 mg Subcutaneous QHS   ??? fluticasone-salmeterol  1 puff Inhalation BID   ??? ipratropium-albuterol  1 puff Inhalation QID   ??? lidocaine  1 patch Transdermal Q24H   ??? mineral oil-hydrophilic petrolatum   Topical 6x Daily   ??? pantoprazole  40 mg IV Push Daily   ??? QUEtiapine  100 mg Oral BID       PRN Meds:  albuterol, benzocaine, HYDROmorphone, ipratropium, menthol, [DISCONTINUED] HYDROmorphone **AND** [DISCONTINUED] sodium chloride **AND** naloxone, ondansetron, oxyCODONE **OR** oxyCODONE **OR** oxyCODONE    Exam:  Current BP 125/79  ~ Pulse 80  ~ Temp 36.7 ???C (98 ???F) (Oral)  ~ Resp 16  ~ Ht 1.626 m (5' 4'')  ~ Wt 62.4 kg (137 lb 9.6 oz)  ~ SpO2 93%  ~ BMI 23.62 kg/m???   Gen: NAD  HEENT: NCAT, EOMI, MMM, neck supple. No NGT in place.   CV: RRR, nl s1 s2  Pulm: Normal respiratory excursions, not tachypneic, no labored breathing.   Abd: Soft, non-distended. Multiple prior incisions, feeding tube sites, drain sites. Has mild expected incisional tenderness, and diffuse mild abdominal tenderness. Incisions clean/dry/intact. Has right abdominal JP drain with scant serosanguineous output.  GU: Voiding spontaneously  Ext: warm, well perfused, no cyanosis, clubbing, or edema. Raised, extremely tender, non-erythematous 2cm diameter bump with central scab on upper midline back at site of previous attempted epidural placement. No rash or excoriations on shoulders. Dry skin and non-bleeding excoriations on bilateral calves. Numbness on back of L hand, between MCP joint line and wrist, extending full width of hand. Moving legs well bilaterally.  Neuro: A&Ox3  Drains: abdominal JPx1 on right with serosanguineous output.  Output by Drain (mL) 06/28/19 0701 - 06/28/19 1900 06/28/19 1901 - 06/29/19 0700 06/29/19 0701 - 06/29/19 1900 06/29/19 1901 - 06/30/19 0700 06/30/19 0701 - 06/30/19 0816   Surgical Drain 1 Right Abdomen JP  7 0 0         Labs:  Recent Labs     06/30/19  0644 06/29/19  0458 06/28/19  0444   HGB 8.1* 8.1* 8.1*   HCT 26.3* 26.0* 25.9*   PLT 664* 593* 513*     No results for input(s): APTT, PT, INR in the last 72 hours.    Recent Labs     06/29/19  0458 06/28/19  0444   CREAT 1.15 0.86   BUN 14 9   NA 140 141   K 4.6 3.9   CL 101 102   CO2 23 25   GLUCOSE 107* 77   CALCIUM 8.9 8.8     Recent Labs     06/29/19  0458 06/28/19  0444   MG 1.7 1.6   PHOS 4.2 3.7     Recent Labs     06/29/19  0458 06/28/19  0444   GLUCOSE 107* 77  No results for input(s): TOTPRO, ALBUMIN in the last 72 hours.  No results for input(s): PREALBUMIN in the last 72 hours.   No results for input(s): BILITOT, BILICON, AST, ALT, ALKPHOS, LDH in the last 72 hours.  No results for input(s): INR in the last 72 hours.  No results for input(s): AMYLASE, LIPASE in the last 72 hours.    Micro:   Recent Labs     06/30/19  0644 06/29/19  0458 06/28/19  0444   WBC 8.92 11.94* 8.30       Recent Results (from the past 336 hour(s))   COVID-19 PCR, Nasopharyngeal    Collection Time: 06/19/19  3:47 PM   Result Value Ref Range    Specimen Type Respiratory, Upper     COVID-19 PCR Not Detected Not Detected     Imaging:  Ct Abd+pelvis W Contrast    Result Date: 06/29/2019  IMPRESSION: Postsurgical changes. No drainable fluid collections demonstrated. No bowel obstruction. There is a small globular hyperdensity noted adjacent to the gastroesophageal junction, indeterminate origin but probably postsurgical material. Signed by: Billee Cashing   06/29/2019 9:26 AM    Xr Chest Ap Portable (1 View)    Result Date: 06/29/2019  IMPRESSION: Normal cardiac silhouette. Improved aeration within the lung parenchyma with improved bibasilar opacities, likely atelectasis. Small left greater than right pleural effusions. Abdominal surgical drain. No significant osseous abnormalities. Signed by: Huel Coventry   06/29/2019 8:18 PM    Xr Chest Ap Portable (1 View)    Result Date: 06/24/2019  IMPRESSION:  Epidural catheter is present. NG/OG is removed. Upper abdominal surgical drains. Allowing for low lung volume the cardiomediastinal silhouette is stable. Mild interstitial pulmonary edema and vascular congestion. Interval increase in small left more than right bilateral pleural effusions with associated atelectasis/consolidation. Decrease in left lateral chest wall subcutaneous emphysema. Signed by: Floy Sabina   06/24/2019 11:42 AM    Xr Kub Portable (1 View)    Result Date: 06/20/2019  IMPRESSION: Moderate stool burden. Normal bowel gas pattern. No intraperitoneal free air. No acute osseous abnormality. I, Quentin Cornwall, M.D., have reviewed this radiological study personally and I am in full agreement with the findings of the report presented here. Dictated by: Carolina Cellar   06/20/2019 4:12 PM Signed by: Quentin Cornwall   06/20/2019 4:31 PM    Xr Chest For Feeding Tube Placement (1 View)    Result Date: 06/22/2019  IMPRESSION: Epidural catheter is present. An enteric tube coursing below diaphragm with the tip projecting over mid gastric body. Upper abdominal surgical drains. Allowing for low lung volume the cardiomediastinal silhouette is stable. Mild interstitial pulmonary edema and vascular congestion. Left chest and abdominal wall moderate subcutaneous emphysema. Signed by: Benjamine Sprague   06/22/2019 9:12 AM    Assessment/Plan:  Donna Gross is a 53 y.o. female with multiple prior surgeries that stemmed from recurrent peptic ulcer disease for which she now presents. She is now 9 Days Post-Op s/p scheduled laparoscopic vagotomy and antrectomy, gastrojejunostomy, and hiatal hernia repair (06/21/19) c/b aspiration during induction of anesthesia for which patient is on empiric IV abx. Pts O2 requirements have continued to decrease in post-operative setting. CXR (06/24/19) shows mild pleural effusions with atelectasis as well as pulmonary congestion, but no focal consolidations or infiltrates to suggest pneumonia/pneumonitis. Mild post-op AKI resolved. Pt has been recovering well but with new chest/epigastric pain with swallowing that is of unclear etiology and slight leukocytosis that is new.     #Neuro  -  Pain Management:     - Chronic Pain to see patient. Goal to transition to oral regimen by tomorrow.    - Patient is currently on IV tylenol and IV robaxin ATC, lidocaine patch daily, Oxy solution SS PRN, dilaudid IVP q4 hours    - Pain with swallowing: Hurricane spray and viscous lidocaine spray PRN   - Activity as tolerated  - PT/OT eval  - SW eval    #Pulmonary   - Pulm Toileting: Incentive spirometer   - duonebs, combivent QID, per RT  - Wean O2 as tolerated    #GI  - Bowel regimen: colace BID, miralax BID, senna qPM  - Ulcer ppx: Protonix 40mg  QDay  - Nutrition orders: Diet blenderized liquid  Dietary nutrition supplements Breakfast, Lunch, Dinner       #GU:   - voiding sponataneously  - Daily BMP   - Replete electrolytes prn    #ID/ Heme:  #Acute blood loss anemia, No POA   Pt had large aspiration event during induction of anesthesia prior to surgery. She was treated with Zosyn (7/1-6) empirically to prevent pneumonitis/pneumonia.  - Daily CBC    #Ext:  - monitor raised, painful bump on midline upper back  - give Aquaphor for pruritic zones on shoulders and calves  - monitor numbness on back of L hand  - zofran PRN itching as regular Nalbuphine for itching is not available due to shortage    #PPx   - SCDs  - OOB to chair today and walking as tolerated    #Disposition  - D/C home vs SNF when tolerating regular diet and pain is controlled on PO pain regimen.  - No home health needs anticipated.  - Please page [phone] (surgery, [place] team) with any questions.  - Code Status: Full Code    Patient seen with General Surgery Team and plan of care discussed with Attending, Alonna Buckler., MD, who is in agreement.     Author:    Katrinka Blazing. Sherea Liptak, NP  8:16 AM 06/30/2019  Division of General Surgery  [facility]

## 2019-06-30 NOTE — Progress Notes
Physical Therapy Treatment      PATIENT: Donna Gross  MRN: [id number]    Treatment Date: 06/30/2019    Patient Presentation: In bed;Oxygen;IV;Bed alarm on;Pulse ox(RN present to [facility] for PT session)    Pertinent Updates: Per chart, plan for d/c home when tolerating reg diet and pain controlled on PO pain regimen.  CT A/P yesterday:  ''No drainable fluid collections demonstrated. No bowel obstruction.''    Precautions   Precautions: Fall risk;Monitor Vitals;Check Labs  Orthotic: None  Current Activity Order: Ambulate;in hall  Weight Bearing Status: Not Applicable  Additional Weight Bearing Status: Not Applicable    Cognition   Cognition: Within Defined Limits  Safety Awareness: Good awareness of safety precautions  Barriers to Learning: None    Bed Mobility   Supine Scooting: Not Performed  Rolling: Independent;to Left  Supine to Sit: Independent;to Left  Sit to Supine: Independent    Functional Mobility   Sit to Stand: Independent  Ambulation: Supervised  Ambulation Distance (Feet): 350 ft x2  Gait Pattern: Within Functional Limits  Assistive Device: None  Wheelchair: Not Performed  Stairs: Declined     Neurological (if indicated)   Neuro Deficits: No      Pain Assessment   Patient complains of pain: Yes  Pain Quality: Aching;Burning  Pain Scale Used: Numeric Pain Scale  Pain Intensity: 8/10  Pain Location: Throat  Action Taken: Nursing notified       Patient Status   Activity Tolerance: Good  Oxygen Needs: Nasal cannula  Flow (& FiO2): 1L/min when initially ambulating.  SaO2 dropped to 88%.  Increased 2L/min and SaO2 stabilized to low 90s.  We reduced O2 to 1L/min for last 160 ft.  SaO2 dropped to 85-86% with ambulation, recovered low 90s with rest break and cues for breathing.     Response to Treatment: Tolerated treatment well;Vital signs changes (Comment);with activity;Resolved with rest;Nursing notified(During ambulation pt was mildly tachycardic with intermittent HR increased to 130s.) Compliance with Precautions: Good  Call light in reach: Yes  Presentation post treatment: In bed;Side rails up;Bed alarm on;Pulse Ox;Lines/drains intact  Comments: Pt readily accepted encouragement to walk 2-3x/day with RN and supp O2,  and to sit in chair at least 1x/day.  She agreed to this plan.     Interdisciplinary Communication   Interdisciplinary Communication: Nurse    Treatment Plan   Continue PT Treatment Plan with Focus on: Stair training;Discharge planning;Gait training;Therapeutic exercise    PT Recommendations   Discharge Recommendation: Physical Therapy;1-3 x/week  Type of therapy: Home Health  Supervision Recommended on Discharge: 8-12 hrs/day;Initially, wean supervision as tolerated  Discharge concerns: Requires supervision for mobility  Discharge Equipment Recommended: None  Equipment ordered: Not applicable      Treatment Completed by: Stan Head    I have reviewed and agree with the treatment performed, the context of this note and the PT charges entered.    Jacklynn Ganong, PT

## 2019-06-30 NOTE — Consults
IP CM ACTIVE DISCHARGE PLANNING  Department of Care Coordination      Admit UEAV:[id number]  Anticipated Date of Discharge: 07/01/2019    Following BJ:YNWG, Dineen Kid., MD        Disposition     Home with Home Health  [address] [place] [address]     Dispo: Per surgery team, plan to DC with [facility] (SN;PT;OT;SW; Med Management) pending medical stability    Home Health Coordination Status (if applicable)     Referral sent-out to providers (via [facility]) (3/6), Order written (2/6)    Referral sent to [place] [facility] contracted agency:     [facility]  Phone: [phone]  Fax: [phone]    Mcneil Sober, RN, BSN, PHN,  06/30/2019

## 2019-06-30 NOTE — Other
Patients Clinical Goal:   Clinical Goal(s) for the Shift: VSS, Pain mgnt, Safety and Comfort  Identify possible barriers to advancing the care plan:   Stability of the patient: Moderately Unstable - medium risk of patient condition declining or worsening    End of Shift Summary:   No acute events overnight.     Vital signs stable with 1L NC and RA after 6AM, BMATx4.  Neuro vascular status intact. Pupils equal and reactive, extraocular eye movement intact, left and right eye both normal. Voiding adequate amount and flatulence present.    Pt continuously refuses to take any PO meds due to pain in throat and chest. Pain managed with hydromorphone 0.4mg  IVP, and after 2100, pt states pain still at 9/10 but no obvious visible singes of distress or resting comfortably eyes closed in bed. JP output -- none     Plan for ''D/C home when tolerating regular diet and pain is controlled on PO pain regimen.''  Educated patient on the medication prescribed. Explained the purpose, side effects, and administration schedule. Provided medication handout for [employer] for better pain management.    Plan of care and safety discussed and verbalized understanding. All needs attended, pt safety measures maintained, bed in lowest position, bed alarm on, call light in reach at all times, report given to next shift.Blood pressure 125/78, pulse 68, temperature 36.4 C (97.6 F), temperature source Oral, resp. rate 16, height 1.626 m (5' 4''), weight 62.4 kg (137 lb 9.6 oz), SpO2 97 %.

## 2019-06-30 NOTE — Consults
Contacted pt to discuss reports she has been upset with nursing, overall care.  Pt stated other than a couple nurses who she felt pushed her too hard care has been very good.  She added that her physicians have been exceptional and that she would ''recommend [place] to anyone''.  Pt reported she hopes to return home where her mother, sister and daughter are poised to assist her as needed.  Also pointed out that SNF placement is also being discussed by the team as a possible dispo given her slow recovery progression.  Again pt reported her desire to discharge home and plans to ''walk twice today'' and increase PO intake to allow for a home dispo.  Encouragement and supportive counseling provided to pt.  Discussed the above with NP Melissa and CM Cleon Gustin

## 2019-07-01 LAB — Basic Metabolic Panel
CREATININE: 0.85 mg/dL (ref 0.60–1.30)
POTASSIUM: 4.4 mmol/L (ref 3.6–5.3)

## 2019-07-01 LAB — Magnesium: MAGNESIUM: 1.7 meq/L (ref 1.4–1.9)

## 2019-07-01 LAB — CBC: MCH CONCENTRATION: 31.1 g/dL — ABNORMAL LOW (ref 31.5–35.5)

## 2019-07-01 LAB — Phosphorus: PHOSPHORUS: 3.3 mg/dL (ref 2.3–4.4)

## 2019-07-01 MED ADMIN — OXYCODONE HCL 5 MG/5ML PO SOLN: 20 mg | ORAL | @ 18:00:00 | Stop: 2019-07-03 | NDC 66689040150

## 2019-07-01 MED ADMIN — FLUTICASONE-SALMETEROL 250-50 MCG/DOSE IN AEPB: 1 | RESPIRATORY_TRACT | @ 18:00:00 | Stop: 2019-07-03 | NDC 00378932132

## 2019-07-01 MED ADMIN — AQUAPHOR EX OINT: TOPICAL | @ 19:00:00 | Stop: 2019-07-03

## 2019-07-01 MED ADMIN — HYDROMORPHONE HCL 1 MG/ML IJ SOLN: .4 mg | INTRAVENOUS | @ 21:00:00 | Stop: 2019-07-02 | NDC 00409128331

## 2019-07-01 MED ADMIN — BENZOCAINE 20 % MT AERO: 1 | OROMUCOSAL | @ 18:00:00 | Stop: 2019-07-03 | NDC 00699310002

## 2019-07-01 MED ADMIN — PANTOPRAZOLE SODIUM 40 MG IV SOLR: 40 mg | INTRAVENOUS | @ 15:00:00 | Stop: 2019-07-03

## 2019-07-01 MED ADMIN — ENOXAPARIN SODIUM 40 MG/0.4ML SC SOLN: 40 mg | SUBCUTANEOUS | @ 04:00:00 | Stop: 2019-07-03 | NDC 60505079204

## 2019-07-01 MED ADMIN — HYDROMORPHONE HCL 1 MG/ML IJ SOLN: .4 mg | INTRAVENOUS | @ 02:00:00 | Stop: 2019-07-02 | NDC 00409128331

## 2019-07-01 MED ADMIN — METHOCARBAMOL IVPB: 750 mg | INTRAVENOUS | @ 07:00:00 | Stop: 2019-07-01

## 2019-07-01 MED ADMIN — LIDOCAINE 5 % EX PTCH: 1 | TRANSDERMAL | @ 01:00:00 | Stop: 2019-07-03 | NDC 00591352530

## 2019-07-01 MED ADMIN — METHOCARBAMOL IVPB: 750 mg | INTRAVENOUS | @ 13:00:00 | Stop: 2019-07-01 | NDC 55150022310

## 2019-07-01 MED ADMIN — IPRATROPIUM-ALBUTEROL 20-100 MCG/ACT IN AERS: 1 | RESPIRATORY_TRACT | @ 13:00:00 | Stop: 2019-07-03 | NDC 00597002402

## 2019-07-01 MED ADMIN — AQUAPHOR EX OINT: TOPICAL | @ 13:00:00 | Stop: 2019-07-03

## 2019-07-01 MED ADMIN — METHOCARBAMOL IVPB: 750 mg | INTRAVENOUS | @ 02:00:00 | Stop: 2019-07-01 | NDC 55150022310

## 2019-07-01 MED ADMIN — IPRATROPIUM-ALBUTEROL 20-100 MCG/ACT IN AERS: 1 | RESPIRATORY_TRACT | @ 01:00:00 | Stop: 2019-07-03 | NDC 00597002402

## 2019-07-01 MED ADMIN — FLUTICASONE-SALMETEROL 250-50 MCG/DOSE IN AEPB: 1 | RESPIRATORY_TRACT | @ 05:00:00 | Stop: 2019-07-03

## 2019-07-01 MED ADMIN — IPRATROPIUM-ALBUTEROL 20-100 MCG/ACT IN AERS: 1 | RESPIRATORY_TRACT | @ 05:00:00 | Stop: 2019-07-03

## 2019-07-01 MED ADMIN — QUETIAPINE FUMARATE 100 MG PO TABS: 100 mg | ORAL | @ 15:00:00 | Stop: 2019-07-03 | NDC 00904664061

## 2019-07-01 MED ADMIN — ACETAMINOPHEN 10 MG/ML IV SOLN: 1000 mg | INTRAVENOUS | @ 08:00:00 | Stop: 2019-07-01 | NDC 43825010201

## 2019-07-01 MED ADMIN — METHOCARBAMOL IVPB: 750 mg | INTRAVENOUS | @ 07:00:00 | Stop: 2019-07-01 | NDC 55150022310

## 2019-07-01 MED ADMIN — OXYCODONE HCL 5 MG/5ML PO SOLN: 20 mg | ORAL | @ 13:00:00 | Stop: 2019-07-03 | NDC 66689040150

## 2019-07-01 MED ADMIN — OXYCODONE HCL 5 MG/5ML PO SOLN: 20 mg | ORAL | @ 04:00:00 | Stop: 2019-07-03 | NDC 66689040150

## 2019-07-01 MED ADMIN — AQUAPHOR EX OINT: TOPICAL | @ 04:00:00 | Stop: 2019-07-03

## 2019-07-01 MED ADMIN — HYDROMORPHONE HCL 1 MG/ML IJ SOLN: .4 mg | INTRAVENOUS | @ 11:00:00 | Stop: 2019-07-02 | NDC 00409128331

## 2019-07-01 MED ADMIN — OXYCODONE HCL 5 MG/5ML PO SOLN: 20 mg | ORAL | @ 22:00:00 | Stop: 2019-07-03 | NDC 66689040150

## 2019-07-01 MED ADMIN — IPRATROPIUM-ALBUTEROL 20-100 MCG/ACT IN AERS: 1 | RESPIRATORY_TRACT | @ 18:00:00 | Stop: 2019-07-03 | NDC 00597002402

## 2019-07-01 MED ADMIN — QUETIAPINE FUMARATE 100 MG PO TABS: 100 mg | ORAL | @ 04:00:00 | Stop: 2019-07-03 | NDC 00904664061

## 2019-07-01 MED ADMIN — HYDROMORPHONE HCL 1 MG/ML IJ SOLN: .4 mg | INTRAVENOUS | @ 15:00:00 | Stop: 2019-07-02 | NDC 00409128331

## 2019-07-01 MED ADMIN — LIDOCAINE 5 % EX PTCH: 1 | TRANSDERMAL | @ 13:00:00 | Stop: 2019-07-03

## 2019-07-01 MED ADMIN — AQUAPHOR EX OINT: TOPICAL | @ 15:00:00 | Stop: 2019-07-03

## 2019-07-01 MED ADMIN — HYDROMORPHONE HCL 1 MG/ML IJ SOLN: .4 mg | INTRAVENOUS | @ 07:00:00 | Stop: 2019-07-02 | NDC 00409128331

## 2019-07-01 MED ADMIN — AQUAPHOR EX OINT: TOPICAL | @ 01:00:00 | Stop: 2019-07-03

## 2019-07-01 MED ADMIN — DEXTROSE 5 %/0.45 % NACL: 50 mL/h | INTRAVENOUS | @ 20:00:00 | Stop: 2019-07-03 | NDC 00338008504

## 2019-07-01 MED ADMIN — AQUAPHOR EX OINT: TOPICAL | @ 21:00:00 | Stop: 2019-07-03

## 2019-07-01 MED ADMIN — ALUM & MAG HYDROXIDE-SIMETH 400-400-40 MG/5ML PO SUSP: 10 mL | ORAL | @ 04:00:00 | Stop: 2019-07-03 | NDC 00121176230

## 2019-07-01 MED ADMIN — HYDROMORPHONE HCL 1 MG/ML IJ SOLN: .4 mg | INTRAVENOUS | @ 07:00:00 | Stop: 2019-07-02

## 2019-07-01 MED ADMIN — SUCRALFATE 1 GM/10ML PO SUSP: 1 g | ORAL | @ 21:00:00 | Stop: 2019-07-03 | NDC 68094004361

## 2019-07-01 MED ADMIN — SUCRALFATE 1 GM/10ML PO SUSP: 1 g | ORAL | @ 18:00:00 | Stop: 2019-07-01 | NDC 68094004361

## 2019-07-01 MED ADMIN — ACETAMINOPHEN 10 MG/ML IV SOLN: 1000 mg | INTRAVENOUS | @ 15:00:00 | Stop: 2019-07-01 | NDC 43825010201

## 2019-07-01 MED ADMIN — OXYCODONE HCL 5 MG/5ML PO SOLN: 20 mg | ORAL | @ 09:00:00 | Stop: 2019-07-03 | NDC 66689040150

## 2019-07-01 NOTE — Other
Patients Clinical Goal:   Clinical Goal(s) for the Shift: VSS, continuous pulse ox, comfort, safety, pain management  Identify possible barriers to advancing the care plan: none  Stability of the patient: Moderately Stable - low risk of patient condition declining or worsening   End of Shift Summary: Pt A/Ox4. CPOx on 1L NC sating >92%. Desat to 88% on RA, attempted to titrate x2. BMAT 4. Pt complaining of abdominal pain, administered prn oxycodone and dilaudid IV per order. Pt able to tolerate majority of blenderized diet, experienced some nausea, gave zofran x1. Pt burping, pt did not have a BM, passed gas after administering mag citrate. Will continue to monitor. Pt voiding clear, yellow urine with adequate output. Encouraged incentive spirometer. PT completed and tolerated well. Call light within reach, bed alarm on, will endorse to oncoming nurse.    BP 106/77  ~ Pulse 98  ~ Temp 37.1 C (98.8 F) (Oral)  ~ Resp 16  ~ Ht 1.626 m (5' 4'')  ~ Wt 62.4 kg (137 lb 9.6 oz)  ~ SpO2 95%  ~ BMI 23.62 kg/m

## 2019-07-01 NOTE — Other
Patients Clinical Goal:   Clinical Goal(s) for the Shift: vss, pain mgmt, cont spO2, rest, comfort & safety  Identify possible barriers to advancing the care plan:   Stability of the patient: Moderately Stable - low risk of patient condition declining or worsening   End of Shift Summary:    A&O x4, resting comfortably with no signs of distress noted.   Tolerating diet, no c/o nausea.   VSS, on cont pulse ox monitoring - 1L NC sating wnl.   BMAT x4, voiding clear yellow urine, Pt experiencing watery loose stools d/t mag citrate.   Pain managed w/PRN - oxycodone & dilaudid.    PIV infusing, cdi   Call light w/in reach, all needs met; will endorse plan to oncoming nurse.   BP 116/71  ~ Pulse 90  ~ Temp 36.8 C (98.2 F) (Oral)  ~ Resp 16  ~ Ht 1.626 m (5' 4'')  ~ Wt 62.4 kg (137 lb 9.6 oz)  ~ SpO2 97%  ~ BMI 23.62 kg/m

## 2019-07-01 NOTE — Progress Notes
SURGERY / [place] [facility] [phone]  Patient: Donna Gross  MRN: [id number]  Date of Service: 07/01/2019  Hospital Day: 9  PCP: [facility], [place]    HPI: Donna Gross is a 53 y.o. female with complex peptic ulcer history who presented to ED with 3 days of N/V and abdominal pain on 06/20/19. She was admitted the night before scheduled surgery. She is s/p scheduled laparoscopic vagotomy and antrectomy, gastrojejunostomy, and hiatal hernia repair (06/21/19).   ???  Interval Events/Subjective:    06/21/19: Pt with nausea but no emesis overnight.  Has c/o of constant abdominal pain. Pt has been NPO for surgery.  06/22/19: NGT left in place after surgery. Patient enduring moderate post-operative pain. Was evaluated without evidence of acute pathology, bleed, or infection. Started on Dilaudid prn for post-operative pain (has chronic pain and moderate opioid requirements at home) in addition to existing PCEA. Patient continues to have pain this morning that is under improved control. Denies nausea or vomiting.  06/23/19: Required 500cc NS bolus x 2 yesterday for rising Cr and SBPs to low 100s. Also started IV tylenol and Dilaudid PCA for pain control. Pt reports pain well controlled this morning. Denies N/V, fevers, or chills. Requiring nasal cannula to maintain O2 sats>92%, but pt denies SOB, respiratory distress, or chest pain.  06/24/19: No acute events. Pulled NGT this morning herself confusing it for nasal cannula. NGT not replaced. Denies passing gas or having BM. O2 requirements decreasing, on 1L NC as of this AM. Complained of itching yesterdayrequiring IV Benadryl x 2. Denies fevers, chills, N/V, or new/worsening abdominal pain other than incisional tenderness.  06/25/19: No acute events. Denies N/V. Denies BM or flatus. Reports abdominal pain remains severe but is improving overall and well controlled with current PCA. Reports sleeping for 2 hrs at a time overnight, waking in 10/10 pain, using PCA with relief. Reports bilateral shoulder and calf pruritis. Reports extremely painful bump on midline upper back. Reports new onset numbness on back of L hand beginning this morning, located around area of IV trials. Reports feeling hungry and is enthusiastic about returning to eating.  06/26/19: No acute events. Denies N/V. Tolerated 500cc/shift clears yesterday without issue. Now passing gas, up and walking, no bowel movements. Continues to have abdominal pain, improved overall. Remains on Dilaudid PCA.  06/27/19: NAEON. Pt slept well with restart of home seroquel. Walking with PT. Passing gas. No BM yesterday. Pain is stable. Pt continues to be on nasal cannula, reports she does not use oxygen at home. Does use nebulized breathing treatments at home prn.  06/28/19: NAEON. Pt denies N/V. Tolerating mech soft diet.  Passing flatus. No BM last 24 hours.  Cont to endorse incisional pain.   06/29/19: NAEON. AFVSS on 2L NC. No respiratory distress. New complaint of difficulty swallowing, pain with swallowing and chest/epigastric pain. Reports only occurs with swallowing, particularly food/liquids but not saliva. Denies chest pressure, burning, radiation of pain, regurgitation, acid reflux symptoms. Refusing PO meds and only wants IV meds and IVF. Crying, frustrated. Otherwise, abdominal pain continues to improve.   06/30/19: NAEO. Pt c/o of continued pain with swallowing with mild improvement with viscous lidocaine and hurricane spray.  Pt is overwhelmed and frustrated with care, nursing, and PT.  07/01/19: NAEO. Patient continues to have throat pain, pain elsewhere improving and requiring less narcotics.     Interval Events/Subjective:      Temp:  [36.3 ???C (97.3 ???F)-37.1 ???C (98.8 ???F)] 36.3 ???C (97.4 ???F)  Heart Rate:  [77-99] 94  Resp:  [16-17] 16  BP: (97-116)/(61-77) 108/67  NBP Mean:  [73-86] 81  SpO2:  [94 %-100 %] 94 %       I/O last 3 completed shifts:  In: 2256.3 [P.O.:360; I.V.:1368.3; Other:528] Out: 3325 [Urine:3325]    Diets/Supplements/Feeds   Diet    Diet mechanical soft     Start Date/Time: 07/01/19 1340      Number of Occurrences: Until Specified   Nourishments    Dietary nutrition supplements Breakfast, Lunch, Dinner     Start Date/Time: 06/30/19 0850      Number of Occurrences: Until Specified       Scheduled Meds:  ??? enoxaparin  40 mg Subcutaneous QHS   ??? fluticasone-salmeterol  1 puff Inhalation BID   ??? ipratropium-albuterol  1 puff Inhalation QID   ??? lidocaine  1 patch Transdermal Q24H   ??? mineral oil-hydrophilic petrolatum   Topical 6x Daily   ??? pantoprazole  40 mg IV Push Daily   ??? QUEtiapine  100 mg Oral BID   ??? sucralfate  1 g Oral Q6H       PRN Meds:  albuterol, aluminum-magnesium hydroxide-simethicone **AND** [COMPLETED] lidocaine viscous, benzocaine, HYDROmorphone, ipratropium, menthol, [DISCONTINUED] HYDROmorphone **AND** [DISCONTINUED] sodium chloride **AND** naloxone, ondansetron, oxyCODONE **OR** oxyCODONE **OR** oxyCODONE    Exam:  Current BP 108/67  ~ Pulse 94  ~ Temp 36.3 ???C (97.4 ???F) (Oral)  ~ Resp 16  ~ Ht 5' 4'' (1.626 m)  ~ Wt 137 lb 9.6 oz (62.4 kg)  ~ SpO2 94%  ~ BMI 23.62 kg/m???   Gen: NAD  HEENT: NCAT, EOMI, MMM, neck supple. No NGT in place.   CV: RRR, nl s1 s2  Pulm: Normal respiratory excursions, not tachypneic, no labored breathing.   Abd: Soft, non-distended. Multiple prior incisions, feeding tube sites, drain sites. Has mild expected incisional tenderness, and diffuse mild abdominal tenderness. Incisions clean/dry/intact.  GU: Voiding spontaneously  Ext: warm, well perfused, no cyanosis, clubbing, or edema. Raised, extremely tender, non-erythematous 2cm diameter bump with central scab on upper midline back at site of previous attempted epidural placement. No rash or excoriations on shoulders. Dry skin and non-bleeding excoriations on bilateral calves. Numbness on back of L hand, between MCP joint line and wrist, extending full width of hand. Moving legs well bilaterally.  Neuro: A&Ox3  Drains: D/C'd  Output by Drain (mL) 06/29/19 0701 - 06/29/19 1900 06/29/19 1901 - 06/30/19 0700 06/30/19 0701 - 06/30/19 1900 06/30/19 1901 - 07/01/19 0700 07/01/19 0701 - 07/01/19 1548   Patient has no LDAs of requested type attached.        Labs:  Recent Labs     07/01/19  0019 06/30/19  0644 06/29/19  0458   HGB 8.7* 8.1* 8.1*   HCT 28.0* 26.3* 26.0*   PLT 676* 664* 593*     No results for input(s): APTT, PT, INR in the last 72 hours.    Recent Labs     07/01/19  0019 06/30/19  0644 06/29/19  0458   CREAT 0.85 0.77 1.15   BUN 7 8 14    NA 138 141 140   K 4.4 4.4 4.6   CL 101 101 101   CO2 20 26 23    GLUCOSE 71 83 107*   CALCIUM 9.4 9.2 8.9     Recent Labs     07/01/19  0019 06/30/19  0644 06/29/19  0458   MG 1.7 1.6 1.7   PHOS 3.3  3.8 4.2     Recent Labs     07/01/19  0019 06/30/19  0644 06/29/19  0458   GLUCOSE 71 83 107*       No results for input(s): TOTPRO, ALBUMIN in the last 72 hours.  No results for input(s): PREALBUMIN in the last 72 hours.   No results for input(s): BILITOT, BILICON, AST, ALT, ALKPHOS, LDH in the last 72 hours.  No results for input(s): INR in the last 72 hours.  No results for input(s): AMYLASE, LIPASE in the last 72 hours.    Micro:   Recent Labs     07/01/19  0019 06/30/19  0644 06/29/19  0458   WBC 12.83* 8.92 11.94*       Recent Results (from the past 336 hour(s))   COVID-19 PCR, Nasopharyngeal    Collection Time: 06/19/19  3:47 PM   Result Value Ref Range    Specimen Type Respiratory, Upper     COVID-19 PCR Not Detected Not Detected     Imaging:  Ct Abd+pelvis W Contrast    Result Date: 06/29/2019  IMPRESSION: Postsurgical changes. No drainable fluid collections demonstrated. No bowel obstruction. There is a small globular hyperdensity noted adjacent to the gastroesophageal junction, indeterminate origin but probably postsurgical material. Signed by: Billee Cashing   06/29/2019 9:26 AM Xr Chest Ap Portable (1 View)    Result Date: 06/29/2019  IMPRESSION: Normal cardiac silhouette. Improved aeration within the lung parenchyma with improved bibasilar opacities, likely atelectasis. Small left greater than right pleural effusions. Abdominal surgical drain. No significant osseous abnormalities. Signed by: Huel Coventry   06/29/2019 8:18 PM    Xr Chest Ap Portable (1 View)    Result Date: 06/24/2019  IMPRESSION:  Epidural catheter is present. NG/OG is removed. Upper abdominal surgical drains. Allowing for low lung volume the cardiomediastinal silhouette is stable. Mild interstitial pulmonary edema and vascular congestion. Interval increase in small left more than right bilateral pleural effusions with associated atelectasis/consolidation. Decrease in left lateral chest wall subcutaneous emphysema. Signed by: Floy Sabina   06/24/2019 11:42 AM    Xr Kub Portable (1 View)    Result Date: 06/20/2019  IMPRESSION: Moderate stool burden. Normal bowel gas pattern. No intraperitoneal free air. No acute osseous abnormality. I, Quentin Cornwall, M.D., have reviewed this radiological study personally and I am in full agreement with the findings of the report presented here. Dictated by: Carolina Cellar   06/20/2019 4:12 PM Signed by: Quentin Cornwall   06/20/2019 4:31 PM    Xr Chest For Feeding Tube Placement (1 View)    Result Date: 06/22/2019  IMPRESSION: Epidural catheter is present. An enteric tube coursing below diaphragm with the tip projecting over mid gastric body. Upper abdominal surgical drains. Allowing for low lung volume the cardiomediastinal silhouette is stable. Mild interstitial pulmonary edema and vascular congestion. Left chest and abdominal wall moderate subcutaneous emphysema. Signed by: Benjamine Sprague   06/22/2019 9:12 AM      Assessment/Plan:  Donna Gross is a 53 y.o. female with multiple prior surgeries that stemmed from recurrent peptic ulcer disease for which she now presents. She is now 9 Days Post-Op s/p scheduled laparoscopic vagotomy and antrectomy, gastrojejunostomy, and hiatal hernia repair (06/21/19) c/b aspiration during induction of anesthesia for which patient is on empiric IV abx. Pts O2 requirements have continued to decrease in post-operative setting. CXR (06/24/19) shows mild pleural effusions with atelectasis as well as pulmonary congestion, but no focal consolidations or infiltrates to suggest pneumonia/pneumonitis. Mild post-op  AKI resolved. Pt has been recovering well but with new chest/epigastric pain with swallowing that is of unclear etiology and slight leukocytosis that is new.    -Advanced to mechanical soft today.     #Neuro  -???Pain Management:     - Chronic Pain to see patient. Goal to transition to oral regimen by tomorrow.    - Patient is currently on IV tylenol and IV robaxin ATC, lidocaine patch daily, Oxy solution SS PRN, dilaudid IVP q4 hours    - Pain with swallowing: Hurricane spray and viscous lidocaine spray PRN   - Activity as tolerated  - PT/OT eval  - SW eval  ???  #Pulmonary   - Pulm Toileting: Incentive spirometer   - duonebs, combivent QID, per RT  - Wean O2 as tolerated, currently on 1LNC.  ???  #GI  -Started sucralfate to attempt at alleviating throat pain.  - Bowel regimen: colace BID, miralax BID, senna qPM  - Ulcer ppx: Protonix 40mg  QDay  - Nutrition orders: Diet blenderized liquid  Dietary nutrition supplements Breakfast, Lunch, Dinner   ???  ???  #GU:   - voiding sponataneously  - Daily BMP   - Replete electrolytes prn  ???  #ID/ Heme:  #Acute blood loss anemia, No POA   Pt had large aspiration event during induction of anesthesia prior to surgery. She was treated with Zosyn (7/1-6) empirically to prevent pneumonitis/pneumonia.  - Daily CBC  ???  #Ext:  - monitor raised, painful bump on midline upper back  - give Aquaphor for pruritic zones on shoulders and calves  - monitor numbness on back of L hand - zofran PRN itching as regular Nalbuphine for itching is not available due to shortage  ???  #PPx   -???SCDs  - OOB to chair today and walking as tolerated  ???  #Disposition  -???D/C home vs SNF when tolerating regular diet and pain is controlled on PO pain regimen.  - No home health needs anticipated.  - Please page [phone] (surgery, [place] team) with any questions.  - Code Status: Full Code    Patient seen with General Surgery Team and plan of care discussed with Attending, Alonna Buckler., MD, who is in agreement.     Author:    Quenton Fetter. Vonita Moss, MD  3:48 PM 07/01/2019  Division of General Surgery  [facility]

## 2019-07-01 NOTE — Consults
LCSW received a SW consult for a 53 year old female.  Pt spoke with the SWer on service yesterday and denied any additional issues at this time -Please review note dated 7.10 .   LCSW will remain available as needed.

## 2019-07-02 LAB — CBC: MEAN CORPUSCULAR VOLUME: 93.1 fL (ref 79.3–98.6)

## 2019-07-02 LAB — Phosphorus: PHOSPHORUS: 4.5 mg/dL — ABNORMAL HIGH (ref 2.3–4.4)

## 2019-07-02 LAB — Basic Metabolic Panel
GFR ESTIMATE FOR NON-AFRICAN AMERICAN: 63 mL/min/{1.73_m2} (ref 7–22)
UREA NITROGEN: 5 mg/dL — ABNORMAL LOW (ref 7–22)

## 2019-07-02 LAB — Magnesium: MAGNESIUM: 1.7 meq/L (ref 1.4–1.9)

## 2019-07-02 MED ORDER — SUCRALFATE 1 GM/10ML PO SUSP
1 g | Freq: Three times a day (TID) | ORAL | 3 refills | 8.00000 days | Status: AC
Start: 2019-07-02 — End: ?
  Filled 2019-07-03: qty 300, 8d supply, fill #0

## 2019-07-02 MED ORDER — LIDOCAINE 5 % EX PTCH
1 | MEDICATED_PATCH | Freq: Every day | TRANSDERMAL | 0 refills | 30.00 days | Status: AC
Start: 2019-07-02 — End: ?

## 2019-07-02 MED ORDER — MENTHOL 5 MG MT LOZG
1 | LOZENGE | OROMUCOSAL | 0 refills | 5.00 days | Status: AC | PRN
Start: 2019-07-02 — End: ?

## 2019-07-02 MED ORDER — HYDROMORPHONE HCL 2 MG PO TABS
2 mg | ORAL_TABLET | ORAL | 0 refills | 5.00 days | Status: SS | PRN
Start: 2019-07-02 — End: 2019-07-15
  Filled 2019-07-03: qty 30, 5d supply, fill #0

## 2019-07-02 MED ORDER — OXYCODONE HCL 5 MG/5ML PO SOLN
20 mg | ORAL | 0 refills | 2 days | Status: AC | PRN
Start: 2019-07-02 — End: 2019-07-11
  Filled 2019-07-03: qty 240, 2d supply, fill #0

## 2019-07-02 MED ORDER — ALUM & MAG HYDROXIDE-SIMETH 400-400-40 MG/5ML PO SUSP
10 mL | ORAL | 3 refills | 6.00 days | Status: AC | PRN
Start: 2019-07-02 — End: ?

## 2019-07-02 MED ORDER — NALOXONE HCL 4 MG/0.1ML NA LIQD
2 refills | 30.00 days | Status: AC
Start: 2019-07-02 — End: ?

## 2019-07-02 MED ADMIN — IPRATROPIUM-ALBUTEROL 20-100 MCG/ACT IN AERS: 1 | RESPIRATORY_TRACT | @ 18:00:00 | Stop: 2019-07-03 | NDC 00597002402

## 2019-07-02 MED ADMIN — DEXTROSE 5 %/0.45 % NACL: 50 mL/h | INTRAVENOUS | @ 11:00:00 | Stop: 2019-07-03 | NDC 00338008504

## 2019-07-02 MED ADMIN — IPRATROPIUM-ALBUTEROL 20-100 MCG/ACT IN AERS: 1 | RESPIRATORY_TRACT | @ 14:00:00 | Stop: 2019-07-03

## 2019-07-02 MED ADMIN — OXYCODONE HCL 5 MG/5ML PO SOLN: 20 mg | ORAL | @ 19:00:00 | Stop: 2019-07-03 | NDC 66689040150

## 2019-07-02 MED ADMIN — OXYCODONE HCL 5 MG/5ML PO SOLN: 20 mg | ORAL | @ 03:00:00 | Stop: 2019-07-03 | NDC 66689040150

## 2019-07-02 MED ADMIN — AQUAPHOR EX OINT: TOPICAL | @ 05:00:00 | Stop: 2019-07-03

## 2019-07-02 MED ADMIN — SUCRALFATE 1 GM/10ML PO SUSP: 1 g | ORAL | @ 19:00:00 | Stop: 2019-07-03 | NDC 68094004361

## 2019-07-02 MED ADMIN — HYDROMORPHONE HCL 2 MG PO TABS: 2 mg | ORAL | @ 17:00:00 | Stop: 2019-07-03 | NDC 42858030125

## 2019-07-02 MED ADMIN — SUCRALFATE 1 GM/10ML PO SUSP: 1 g | ORAL | @ 08:00:00 | Stop: 2019-07-03 | NDC 68094004361

## 2019-07-02 MED ADMIN — FLUTICASONE-SALMETEROL 250-50 MCG/DOSE IN AEPB: 1 | RESPIRATORY_TRACT | @ 04:00:00 | Stop: 2019-07-03 | NDC 00378932132

## 2019-07-02 MED ADMIN — MENTHOL 5 MG MT LOZG: 5 mg | OROMUCOSAL | @ 19:00:00 | Stop: 2019-07-03 | NDC 41528000066

## 2019-07-02 MED ADMIN — SUCRALFATE 1 GM/10ML PO SUSP: 1 g | ORAL | @ 14:00:00 | Stop: 2019-07-03 | NDC 68094004361

## 2019-07-02 MED ADMIN — IPRATROPIUM-ALBUTEROL 20-100 MCG/ACT IN AERS: 1 | RESPIRATORY_TRACT | @ 04:00:00 | Stop: 2019-07-03 | NDC 00597002402

## 2019-07-02 MED ADMIN — OXYCODONE HCL 5 MG/5ML PO SOLN: 20 mg | ORAL | @ 08:00:00 | Stop: 2019-07-03 | NDC 66689040150

## 2019-07-02 MED ADMIN — QUETIAPINE FUMARATE 100 MG PO TABS: 100 mg | ORAL | @ 03:00:00 | Stop: 2019-07-03 | NDC 00904664061

## 2019-07-02 MED ADMIN — HYDROMORPHONE HCL 1 MG/ML IJ SOLN: .4 mg | INTRAVENOUS | @ 05:00:00 | Stop: 2019-07-02 | NDC 00409128331

## 2019-07-02 MED ADMIN — FLUTICASONE-SALMETEROL 250-50 MCG/DOSE IN AEPB: 1 | RESPIRATORY_TRACT | @ 16:00:00 | Stop: 2019-07-03 | NDC 00378932132

## 2019-07-02 MED ADMIN — AQUAPHOR EX OINT: TOPICAL | @ 14:00:00 | Stop: 2019-07-03

## 2019-07-02 MED ADMIN — LIDOCAINE 5 % EX PTCH: 1 | TRANSDERMAL | @ 01:00:00 | Stop: 2019-07-03 | NDC 00591352530

## 2019-07-02 MED ADMIN — HYDROMORPHONE HCL 1 MG/ML IJ SOLN: .4 mg | INTRAVENOUS | @ 01:00:00 | Stop: 2019-07-02 | NDC 00409128331

## 2019-07-02 MED ADMIN — ALUM & MAG HYDROXIDE-SIMETH 400-400-40 MG/5ML PO SUSP: 10 mL | ORAL | @ 20:00:00 | Stop: 2019-07-03 | NDC 00121176230

## 2019-07-02 MED ADMIN — HYDROMORPHONE HCL 1 MG/ML IJ SOLN: .4 mg | INTRAVENOUS | @ 11:00:00 | Stop: 2019-07-02 | NDC 00409128331

## 2019-07-02 MED ADMIN — OXYCODONE HCL 5 MG/5ML PO SOLN: 20 mg | ORAL | @ 15:00:00 | Stop: 2019-07-03 | NDC 66689040150

## 2019-07-02 MED ADMIN — LIDOCAINE 5 % EX PTCH: 1 | TRANSDERMAL | @ 14:00:00 | Stop: 2019-07-03

## 2019-07-02 MED ADMIN — AQUAPHOR EX OINT: TOPICAL | @ 01:00:00 | Stop: 2019-07-03

## 2019-07-02 MED ADMIN — PANTOPRAZOLE SODIUM 40 MG IV SOLR: 40 mg | INTRAVENOUS | @ 15:00:00 | Stop: 2019-07-03 | NDC 00143928410

## 2019-07-02 MED ADMIN — QUETIAPINE FUMARATE 100 MG PO TABS: 100 mg | ORAL | @ 15:00:00 | Stop: 2019-07-03 | NDC 00904664061

## 2019-07-02 MED ADMIN — MENTHOL 5 MG MT LOZG: 5 mg | OROMUCOSAL | @ 01:00:00 | Stop: 2019-07-03 | NDC 41528000066

## 2019-07-02 MED ADMIN — SUCRALFATE 1 GM/10ML PO SUSP: 1 g | ORAL | @ 01:00:00 | Stop: 2019-07-03 | NDC 68094004361

## 2019-07-02 MED ADMIN — AQUAPHOR EX OINT: TOPICAL | @ 19:00:00 | Stop: 2019-07-03

## 2019-07-02 MED ADMIN — MENTHOL 5 MG MT LOZG: 5 mg | OROMUCOSAL | @ 11:00:00 | Stop: 2019-07-03 | NDC 41528000066

## 2019-07-02 MED ADMIN — ONDANSETRON HCL 4 MG/2ML IJ SOLN: 4 mg | INTRAVENOUS | @ 03:00:00 | Stop: 2019-07-03 | NDC 60505613005

## 2019-07-02 MED ADMIN — IPRATROPIUM-ALBUTEROL 20-100 MCG/ACT IN AERS: 1 | RESPIRATORY_TRACT | @ 01:00:00 | Stop: 2019-07-03 | NDC 00597002402

## 2019-07-02 MED ADMIN — HYDROMORPHONE HCL 2 MG PO TABS: 2 mg | ORAL | @ 22:00:00 | Stop: 2019-07-03 | NDC 42858030125

## 2019-07-02 MED ADMIN — ENOXAPARIN SODIUM 40 MG/0.4ML SC SOLN: 40 mg | SUBCUTANEOUS | @ 03:00:00 | Stop: 2019-07-03 | NDC 60505079204

## 2019-07-02 MED ADMIN — AQUAPHOR EX OINT: TOPICAL | @ 17:00:00 | Stop: 2019-07-03

## 2019-07-02 NOTE — Other
Patients Clinical Goal:   Clinical Goal(s) for the Shift: painmgmt, vss, o2 sats, tolerate diet, comfort/safety/fall/sleep hygiene measures  Identify possible barriers to advancing the care plan:   Stability of the patient: Moderately Stable - low risk of patient condition declining or worsening   End of Shift Summary: pt a/o x4, pain managed with po oxycodone 20 mg q4hrs and ivp dilaudid 0.4mg  prn q4hrs, ivf@50ml /hr, zofran given x1,  poor appetite due to throat pain, o2 sats>93% on RA but did desat@88 % but during sleep, up to void bm x 1, call light within reach, comfort/safety/fall/sleep hygiene measures provided, will cont' to monitor until endorsing poc to oncoming RN BP 101/64 (Patient Position: Lying)  ~ Pulse 94  ~ Temp 37.1 C (98.8 F) (Oral)  ~ Resp 16  ~ Ht 1.626 m (5' 4'')  ~ Wt 62.4 kg (137 lb 9.6 oz)  ~ SpO2 93%  ~ BMI 23.62 kg/m

## 2019-07-02 NOTE — Discharge Summary
DISCHARGE SUMMARY/ GENERAL SURGERY  PATIENT:  Donna Gross MRN:  [id number]  DOB:  1966-10-19    Attending Physician: Alonna Buckler., MD  Primary Care Physician:  [facility], [place]    Admission Date:  06/22/2019  Discharge Date:  No discharge date for patient encounter.     Admission Diagnosis:    Acute epigastric pain [R10.13]   Peptic ulcer disease  Chronic pain  Discharge Diagnosis:   Acute epigastric pain [R10.13]   Peptic ulcer disease  Chronic pain    Surgery Performed:   scheduled laparoscopic vagotomy and antrectomy, gastrojejunostomy, and hiatal hernia repair (06/21/19)    Complications:   Aspiration event prior to surgery    Consultations:   Acute Pain Service     Chronic Conditions:  Past Medical History:   Diagnosis Date   ??? Anxiety    ??? Depression    ??? Emphysema, unspecified (HCC/RAF)    ??? Fibromyalgia    ??? Gastritis    ??? GERD (gastroesophageal reflux disease)    ??? Pancreatitis        HPI: Donna Gross is a 53 y.o. female with a past medical history of     Brief Hospital Course:     On 06/21/2019 the patient was taken to the operating room at [facility] and underwent above mentioned general surgery. Please refer to operation report for details. The patient tolerated the operation well and was admitted for standard postoperative management and monitoring.  The pt had an epidural placed with additional PCA for post-operative pain control. The patient was transferred to 4NW unit in stable condition. Perioperative course was complicated by aspiration event during induction of anesthesia prior to surgery. She was treated with Zosyn (7/1-6) empirically to prevent pneumonitis/pneumonia. She was started on DVT prophylaxis, a bowel regimen, and appropriate home medications were restarted. Pt was initially NPO with NGT in place, however, pulled out own NGT on POD4. Diet was slowly advanced from clears to regular diet, which patient was tolerating by POD 7. Hospital course complicated by mild post operative AKI that resolved with fluids and persistent pain requirements as well as the development of a sore throat. Patient initially refused all po medications, however, we able to transition to primarily liquid po pain medications. Per patient preference patient remained on mechanical soft due to sore throat which did improve with benzocaine spray, sucralfate, and viscous lidocaine. Dilaudid PCA was discontinued and patient was transitioned to dilaudid q4 IV and oxycodone 20 mg q4. On POD 11 patient was weaned from dilaudid IV and transitioned to dilaudid po q4 with oxycodone q4. Patient was breathing easily on RA, was walking on own, eating mechanical soft diet, with well healing abdominal incisions and improved abdominal pain. Patient and team were in agreement that she was stable for discharge home.     Physical Exam:  BP 98/66  ~ Pulse 72  ~ Temp 35.7 ???C (96.3 ???F) (Oral)  ~ Resp 18  ~ Ht 5' 4'' (1.626 m)  ~ Wt 137 lb 9.6 oz (62.4 kg)  ~ SpO2 93%  ~ BMI 23.62 kg/m???   HEENT: NCAT, EOMI, MMM, neck supple.   CV: RRR, nl s1 s2  Pulm: Normal respiratory excursions, not tachypneic, no labored breathing.   Abd: Soft, non-distended. Multiple prior incisions, feeding tube sites, drain sites. Has mild expected incisional tenderness, and diffuse mild abdominal tenderness. Incisions clean/dry/intact.  GU: Voiding spontaneously  Ext: warm, well perfused, no cyanosis, clubbing, or edema.  No rash or excoriations on shoulders. Dry skin and non-bleeding excoriations on bilateral calves improved with aquaphor. Numbness on back of L hand, between MCP joint line and wrist, extending full width of hand. Moving legs well bilaterally.  Neuro: A&Ox3    Discharge Labs:  WBC/Hgb/Hct/Plts:  10.14/7.9/27.1/799 (07/12 0050)   Na/K/Cl/CO2/BUN/Cr/glu:  140/5.0/105/21/5/1.02/129 (07/12 0050)  Lab Results   Component Value Date    ALT 13 06/20/2019 AST 23 06/20/2019    ALKPHOS 95 06/20/2019    BILITOT <0.2 06/20/2019       Imaging/Studies:  Ct Abd+pelvis W Contrast    Result Date: 06/29/2019  CT ABD+PELVIS W CONTRAST CLINICAL HISTORY: recent abdominal surgery, new leukocytosis, Post-op. COMPARISON: March 09, 2019 December 20, 2015 TECHNIQUE: On a multirow-detector CT scanner, a volumetric contrast enhanced scan was performed through the abdomen and pelvis. CONTRAST: iohexol (Omnipaque) 350 mg/mL inj 125 mL EXAM DOSAGE: The patient received the following exposure event(s) during this study, and the dose reference values for each are as shown (CTDIvol in mGy, DLP in mGy-cm). Note that the values are not patient dose but numbers generated from scan acquisition factors based on 32 cm (L) and/or 16 cm (S) phantoms and may substantially under-estimate or over-estimate actual patient dose based on patient size and other factors. PreMonitoring, CTDI(L): 4, DLP: 3.9;Monitoring, CTDI(L): 4, DLP: 3.9;Abd/Pel wc, CTDI(L): 14.6, DLP: 738.1 FINDINGS: Lung bases: Small left and trace right pleural effusion, with atelectasis at the lung bases.. Liver: Unremarkable. Gallbladder and bile ducts: Distended gallbladder without pericholecystic inflammatory change. Mild intrahepatic biliary dilation with common bile duct tapering to the ampulla, not significant change from CT December 20, 2015.Marland Kitchen Spleen: Unremarkable. Pancreas: Unremarkable. Adrenals: Unremarkable. Kidneys and ureters: Unremarkable. Bowel: Status post antrectomy, gastrojejunostomy, and hiatal hernia repair. Surgical drain adjacent to the stomach from right lower quadrant approach. Globular hyperdensity noted at the gastroesophageal junction (5-28) no fluid collections. Nondilated bowel. The distal esophagus is mildly thickened.. Bladder: Unremarkable. Reproductive organs: Unremarkable. Lymph nodes: Unremarkable. Peritoneum: Mild mesenteric edema. Vessels: Moderate atherosclerotic vascular calcifications including coronary calcifications. Abdominal wall: Postsurgical changes in the anterior abdominal wall and mild subcutaneous edema. Bones: No suspicious osseous lesions..     IMPRESSION: Postsurgical changes. No drainable fluid collections demonstrated. No bowel obstruction. There is a small globular hyperdensity noted adjacent to the gastroesophageal junction, indeterminate origin but probably postsurgical material. Signed by: Billee Cashing   06/29/2019 9:26 AM    Xr Chest Ap Portable (1 View)    Result Date: 06/29/2019  XR CHEST AP 1V PORTABLE   06/29/2019 6:10 AM COMPARISON: 06/24/2019 0851 hours HISTORY: difficulty swallowing s/p hiatal hernia repair     IMPRESSION: Normal cardiac silhouette. Improved aeration within the lung parenchyma with improved bibasilar opacities, likely atelectasis. Small left greater than right pleural effusions. Abdominal surgical drain. No significant osseous abnormalities. Signed by: Huel Coventry   06/29/2019 8:18 PM    Xr Chest Ap Portable (1 View)    Result Date: 06/24/2019  XR CHEST AP 1V PORTABLE  CLINICAL HISTORY: Shortness of breath. COMPARISON: 06/21/2019.     IMPRESSION:  Epidural catheter is present. NG/OG is removed. Upper abdominal surgical drains. Allowing for low lung volume the cardiomediastinal silhouette is stable. Mild interstitial pulmonary edema and vascular congestion. Interval increase in small left more than right bilateral pleural effusions with associated atelectasis/consolidation. Decrease in left lateral chest wall subcutaneous emphysema. Signed by: Floy Sabina   06/24/2019 11:42 AM    Xr Kub Portable (1 View)    Result Date: 06/20/2019  XR KUB 1V PORTABLE CLINICAL HISTORY: pt feel distended, n/v. COMPARISON: CT abdomen and pelvis dated March 09, 2019.     IMPRESSION: Moderate stool burden. Normal bowel gas pattern. No intraperitoneal free air. No acute osseous abnormality. I, Quentin Cornwall, M.D., have reviewed this radiological study personally and I am in full agreement with the findings of the report presented here. Dictated by: Carolina Cellar   06/20/2019 4:12 PM Signed by: Quentin Cornwall   06/20/2019 4:31 PM    Xr Chest For Feeding Tube Placement (1 View)    Result Date: 06/22/2019  EXAM:  XR CHEST FOR FEEDING TUBE PLACEMENT 1V  06/21/2019 COMPARISON: March 09, 2019 INDICATION:   Tube Position     IMPRESSION: Epidural catheter is present. An enteric tube coursing below diaphragm with the tip projecting over mid gastric body. Upper abdominal surgical drains. Allowing for low lung volume the cardiomediastinal silhouette is stable. Mild interstitial pulmonary edema and vascular congestion. Left chest and abdominal wall moderate subcutaneous emphysema. Signed by: Benjamine Sprague   06/22/2019 9:12 AM      Discharge Medications:   Current Discharge Medication List      START taking these medications    Details   lidocaine 5% patch Place 1 patch onto the skin daily 12 hours on 12 hours off.Rob Bunting: 30 patch, Refills: 0      menthol 5 mg lozenge Use as directed 1 lozenge (5 mg total) in the mouth or throat every four (4) hours as needed for Cough or Sore Throat.  Qty: 30 lozenge, Refills: 0         CONTINUE these medications which have NOT CHANGED    Details   albuterol (2.5 mg/61mL) 0.083% nebulizer solution Take 2.5 mg by nebulization every eight (8) hours as needed (SOB) .      albuterol 90 mcg/act inhaler Inhale 2 puffs every eight (8) hours as needed (SOB).      aluminum-magnesium hydroxide-simethicone (ALMACONE) 200-200-20 mg/5 mL suspension Take 5 mLs by mouth three (3) times daily.      clobetasol 0.05% cream Apply topically three (3) times daily as needed (rash).      cyclobenzaprine 10 mg tablet Take 10 mg by mouth two (2) times daily as needed for Muscle spasms.      fluticasone-salmeterol (WIXELA INHUB) 250-50 mcg/dose diskus Inhale 1 puff two (2) times daily as needed (SOB). ipratropium-albuterol 20-100 mcg/act inhaler Inhale 2 puffs three (3) times daily as needed (SOB).      lidocaine viscous 2% oral solution Swish and spit 5 mLs three (3) times daily.      PANTOPRAZOLE SODIUM PO Take 40 mg by mouth daily.      promethazine 6.25 mg/5 mL solution Take 6.25 mg by mouth every six (6) hours as needed (cough).      quetiapine 100 mg tablet Take 1 tablet (100 mg total) by mouth two (2) times daily.            Also given:  Oxycodone 20 mg q4 hours PRN severe pain  Dilaudid 2 mg po q4 hours PRN breakthrough pain  Naloxone nasal spray PRN    Disposition: Home.    Disposition: Home.      Discharge Condition: Stable    Diet: Regular (Ok for mechanical soft as tolerated/ per patient preference)    Activity/ Restrictions: Ambulate as tolerated.  No heavy lifting over 5-10 pounds for 4-6 weeks, and may increase activity as tolerated.  May shower once dressings are removed  but do not submerge your incisions in a bath or pool.    Return Precautions: As listed on After Visit Summary (AVS), but also including:      - Onset of severe, persistent pain not relieved by medication and rest.      - Difficulty obtaining medications      - Any new onset of or increased weakness, numbness or tingling      - Persistent chills; new onset of fever > 101 degrees F, or night sweats      - Any redness, swelling, drainage, heat, or pain around your incision      - Any new onset of chest pain or shortness of breath      - If applicable, any clogging or dislodgement of your surgical drain.    Post-Discharge Referrals: None    Post-Discharge Appointments:   The patient was asked to follow up with surgeon for a postoperative visit at [facility] clinic within 1-2 weeks.  Please, call [phone] to set up a follow up appointment.      Additional appointments should be made with:   Follow up with your primary care doctor in 2-3 weeks.    The patient was seen and examined by the General Surgery team. Above plan was discussed with chief resident and attending physician, Alonna Buckler., MD, who were in agreement with the plan.    Author: Malachi Carl. O'Neil-Tennant, MD  07/02/2019 1:17 PM  Division of General Surgery

## 2019-07-02 NOTE — Other
Patients Clinical Goal:   Clinical Goal(s) for the Shift: VSS, continuous pulse ox, rest, comfort, safety, control loose stools  Identify possible barriers to advancing the care plan: none  Stability of the patient: Moderately Stable - low risk of patient condition declining or worsening   End of Shift Summary: Pt A/Ox4. BMAT 4. On continuous pulse ox at 1L NC at 91%. Pt on mechanical soft diet with no n/v. Pt voiding clear yellow urine. Pt had 5/6 episodes of loose stools during shift, Dr. Ward Chatters aware, no need to collect stool due to pt receiving mag sulfate yesterday. PIV infusing D5 1/2NS at 23ml/hr. Pt continues to have abdominal and throat pain, administered prn oxycodone and dilaudid IV per order. Try to weep pt off IV dilaudid and plan for DC tomorrow. Call light within reach, bed alarm on, will endorse to oncoming nurse.    BP 109/76  ~ Pulse (!) 107  ~ Temp 37.1 C (98.8 F) (Oral)  ~ Resp 16  ~ Ht 1.626 m (5' 4'')  ~ Wt 62.4 kg (137 lb 9.6 oz)  ~ SpO2 95%  ~ BMI 23.62 kg/m

## 2019-07-03 ENCOUNTER — Ambulatory Visit: Payer: PRIVATE HEALTH INSURANCE

## 2019-07-03 DIAGNOSIS — G8918 Other acute postprocedural pain: Secondary | ICD-10-CM

## 2019-07-03 NOTE — Other
Patients Clinical Goal:   Clinical Goal(s) for the Shift: vss, safety, pain management, plan for dc home   Identify possible barriers to advancing the care plan:   Stability of the patient: Moderately Stable - low risk of patient condition declining or worsening   End of Shift Summary:     AAOx4, calm, cooperative  Pain managed adequately (per the pt) with oral dilaudid and oxycodone throughout this shift, no IVP dilaudid given  Pt c/o sore throat, PRNs given with minimal relief. Pt states it has improved since surgery and she believes it will continue to get better after discharge  Pt able to ambulate freely in room, BMAT 4 and appears steady  Medications sent to [place], daughter picked up prior to picking pt up at [address] lobby.  All questions addressed at this time, PIV removed without issues, and all valuables with the pt  BP 98/66  ~ Pulse 72  ~ Temp 35.7 C (96.3 F) (Oral)  ~ Resp 18  ~ Ht 1.626 m (5' 4'')  ~ Wt 62.4 kg (137 lb 9.6 oz)  ~ SpO2 93%  ~ BMI 23.62 kg/m

## 2019-07-05 NOTE — Progress Notes
Physical Therapy  Discharge Summary    PATIENT: Donna Gross  MRN: [id number]  DOB: 1966-03-30      Date:  07/05/2019   Therapist: Adriana Reams, PT          Patient has been seen for:  Bed mobility training;Transfer training;Gait training;Stair training;Patient and/or family education;Discharge planning    Objective     See Daily Progress Notes for functional levels     Patient showing progress in: Bed mobility training;Transfer training;Gait training;Stair training;Patient and/or family education;Discharge planning    Assessment     Goals met: No(2/3 goals met)    Reason Goal(s) Not Met: Weakness         Goals:  Short Term Goals to be achieved in: 7 days  Pt will perform bed mobility: with supervision  Pt will ambulate: 11-30 feet, with FWW, with stand by assist, with verbal cues  Pt will go up/down stairs: 1-2 stairs, with stand by assist    Continue present treatment plan: No         Discontinue PT at this time (Comment);Pt discharged from hospital    Updated Discharge Recommendations:  Discharge Recommendation: Physical Therapy;1-3 x/week  Type of therapy: Home Health  Supervision Recommended on Discharge: 8-12 hrs/day;Initially, wean supervision as tolerated  Discharge concerns: Requires supervision for mobility  Discharge Equipment Recommended: None  Equipment ordered: Not applicable    AM-PAC   AM-PAC Basic Mobility t-Scale Score: 47.4  AM-PAC Basic Mobility CMS 'G Code' Modifier: CJ

## 2019-07-10 MED ORDER — OXYCODONE HCL 5 MG/5ML PO SOLN
20 mg | ORAL | 0 refills | Status: AC | PRN
Start: 2019-07-10 — End: 2019-07-15

## 2019-07-12 ENCOUNTER — Inpatient Hospital Stay
Admit: 2019-07-12 | Discharge: 2019-07-12 | Disposition: A | Discharge status: Home / Self Care | Payer: PRIVATE HEALTH INSURANCE | Source: Home / Self Care

## 2019-07-12 ENCOUNTER — Inpatient Hospital Stay: Admit: 2019-07-12 | Payer: PRIVATE HEALTH INSURANCE | Source: Home / Self Care

## 2019-07-12 ENCOUNTER — Ambulatory Visit: Payer: PRIVATE HEALTH INSURANCE

## 2019-07-12 ENCOUNTER — Telehealth: Payer: PRIVATE HEALTH INSURANCE

## 2019-07-12 DIAGNOSIS — R1310 Dysphagia, unspecified: Secondary | ICD-10-CM

## 2019-07-12 DIAGNOSIS — R109 Unspecified abdominal pain: Secondary | ICD-10-CM

## 2019-07-12 LAB — Lipase: LIPASE: 114 U/L — ABNORMAL HIGH (ref 9–63)

## 2019-07-12 LAB — Extra Light Green Top

## 2019-07-12 LAB — Basic Metabolic Panel
ANION GAP: 16 mmol/L (ref 8–19)
CHLORIDE: 102 mmol/L (ref 96–106)

## 2019-07-12 LAB — Prothrombin Time Panel: INR: 1 s (ref 11.5–14.4)

## 2019-07-12 LAB — Hepatic Funct Panel: ALKALINE PHOSPHATASE: 107 U/L (ref 37–113)

## 2019-07-12 LAB — Amylase: AMYLASE: 184 U/L — ABNORMAL HIGH (ref 31–124)

## 2019-07-12 LAB — Differential Automated: LYMPHOCYTE PERCENT, AUTO: 40.6 (ref 0.00–0.50)

## 2019-07-12 LAB — CBC: RED BLOOD CELL COUNT: 3.82 x10E6/uL — ABNORMAL LOW (ref 3.96–5.09)

## 2019-07-12 MED ORDER — ONDANSETRON HCL 4 MG PO TABS
4 mg | ORAL_TABLET | Freq: Four times a day (QID) | ORAL | 0 refills | 8.00000 days | Status: SS | PRN
Start: 2019-07-12 — End: ?

## 2019-07-12 MED ORDER — DICYCLOMINE HCL 20 MG PO TABS
10 mg | ORAL_TABLET | Freq: Two times a day (BID) | ORAL | 0 refills | 23.00000 days | Status: SS
Start: 2019-07-12 — End: ?

## 2019-07-12 MED ADMIN — LIDOCAINE VISCOUS HCL 2 % MT SOLN: 20 mL | ORAL | @ 18:00:00 | Stop: 2019-07-12 | NDC 50383077517

## 2019-07-12 MED ADMIN — SODIUM CHLORIDE 0.9 % IV BOLUS: 1000 mL | INTRAVENOUS | @ 22:00:00 | Stop: 2019-07-12 | NDC 00338004904

## 2019-07-12 MED ADMIN — SODIUM CHLORIDE 0.9 % IV BOLUS: 1000 mL | INTRAVENOUS | @ 18:00:00 | Stop: 2019-07-12 | NDC 00338004904

## 2019-07-12 MED ADMIN — FENTANYL CITRATE (PF) 100 MCG/2ML IJ SOLN: 50 ug | INTRAVENOUS | @ 18:00:00 | Stop: 2019-07-12 | NDC 00641602725

## 2019-07-12 MED ADMIN — ONDANSETRON HCL 4 MG/2ML IJ SOLN: 4 mg | INTRAVENOUS | @ 18:00:00 | Stop: 2019-07-12 | NDC 60505613005

## 2019-07-12 MED ADMIN — HYDROMORPHONE HCL 1 MG/ML IJ SOLN: .5 mg | INTRAVENOUS | @ 22:00:00 | Stop: 2019-07-12 | NDC 00409128331

## 2019-07-12 MED ADMIN — IOHEXOL 350 MG/ML IV SOLN: 100 mL | INTRAVENOUS | Stop: 2019-07-13 | NDC 00407141491

## 2019-07-12 NOTE — ED Provider Notes
[facility]  Emergency Department Service Report    Jennie Hannay 53 y.o. female , presents with Post-op Problem      Triage   Arrived on 07/12/2019 at 10:50 AM   Arrived by Walk-in [14]    ED Triage Vitals   Temp Temp Source BP Heart Rate Resp SpO2 O2 Device Pain Score Weight   07/12/19 1050 07/12/19 1050 07/12/19 1059 07/12/19 1059 07/12/19 1059 07/12/19 1059 07/12/19 1059 07/12/19 1059 --   36 ???C (96.8 ???F) Temporal 126/86 91 18 99 % None (Room air) Ten        Pre hospital care:       Allergies   Allergen Reactions   ??? Hydrocodone Other (See Comments)     ''burns a hole in stomach''   ??? Ibuprofen Other (See Comments)     GI discomfort    ''hurts my stomach''       History   Patient is a 53 y.o. female with hx of anxiety, emphysema, fibromyalgia, gastritis, GERD and pancreatitis who presents to the ED with complaint of acute onset throat pain for 1 week. Pain is described as feeling like something is stuck in her throat and is worsened with eating. Associated diarrhea, emesis and abdominal pain. Patient reports she had surgery 20 days ago for hernia repair and a stomach ulcer. Patient has been eating soft foot with no relief. Denies DM and HTN.     The history is provided by the patient. No language interpreter was used.   Sore Throat   This is a new problem. The current episode started in the past 7 days. The problem occurs constantly. The problem has been unchanged. Associated symptoms include abdominal pain, nausea and vomiting. The symptoms are aggravated by eating.            Past Medical History:   Diagnosis Date   ??? Anxiety    ??? Depression    ??? Emphysema, unspecified (HCC/RAF)    ??? Fibromyalgia    ??? Gastritis    ??? GERD (gastroesophageal reflux disease)    ??? Pancreatitis         Past Surgical History:   Procedure Laterality Date   ??? ABDOMINAL SURGERY     ??? COLON SURGERY          Past Family History   family history includes Diabetes in her brother and father; Heart disease in her mother. Past Social History   she reports that she has been smoking cigarettes. She has been smoking about 0.25 packs per day. She uses smokeless tobacco. She reports that she does not drink alcohol, use drugs, or engage in sexual activity.     Review of Systems   Gastrointestinal: Positive for abdominal pain, nausea and vomiting.   All other systems reviewed and are negative.      Physical Exam   Physical Exam  PHYSICAL EXAM:   VITAL SIGNS: Reviewed by me.  GENERAL: Well developed and appropriate, well appearing, in mild distress.  HEENT: Atraumatic. Pupils reactive. EOMI. Oropharynx is clear. No nystagmus or photophobia.  NECK: Soft and supple.   CHEST: Good clear breath sounds bilaterally.   HEART: Regular rate and rhythm, no murmurs.  ABDOMEN: Soft, diffusely tender, non-distended. Good bowel sounds. No rebound or guarding.  BACK:  No tenderness.  EXTREMITIES: Good pulses and cap refill. Motor intact. Grossly neurovascularly intact.  NEURO: Awake and Alert. Appropriate. Nonfocal.  SKIN: No rashes. Warm. Dry.  PSYCH: Normal Mood, normal  affect.      ED Course          Laboratory Results   Labs Reviewed - No data to display    Imaging Results     No orders to display       Administered Medications     Medication Administration from 07/12/2019 1050 to 07/12/2019 1109     None          Procedures   Procedures    MDM    Clinical Impression   No diagnosis found.    Prescriptions     New Prescriptions    No medications on file       Disposition and Follow-up   Disposition:     Future Appointments   Date Time Provider Department Center   07/14/2019  1:30 PM Alonna Buckler., MD SUR GEN 102 SURGERY       Follow up with:  No follow-up provider specified.    Return precautions are specified on After Visit Summary.      The documentation on this chart was performed by Antwain Caliendo, scribed for Delma Officer., MD    07/12/2019 11:13 AM     ***

## 2019-07-12 NOTE — ED Notes
COLLECTIVE?NOTIFICATION?07/12/2019 10:50?Donna Gross?MRN: [id number]    [name]:   XBJ:[id number]  Account [id number]  Billing Account [id number]      Criteria Met      History of Sepsis Dx    Security and Safety  No recent Security Events currently on file    ED Care Guidelines  There are currently no ED Care Guidelines for this patient. Please check your facility's medical records system.    Flags      History of Sepsis - Patient has received a diagnosis of Sepsis from an acute or post-acute setting. Apply appropriate clinical planning practices; to learn more visit [email or web address] / Attributed By: [facility] / Attributed On: 12/22/2018           E.D. Visit Count (12 mo.)  Facility Visits   [facility] [place] 1   [name] 7   [facility] 3   [facility] [facility] 1   Total 12   Note: Visits indicate total known visits.      Recent Emergency Department Visit Summary  Showing 10 most recent visits out of 12 in the past 12 months  Date Facility [facility] Type Diagnoses or Chief Complaint   Jul 12, 2019 [facility] [place] [facility]     Mar 09, 2019 [facility] [place] [facility]      1. Duodenal ulcer, unspecified as acute or chronic, without hemo      2. Abdominal Pain      Feb 18, 2019 [name] [place] [facility]  Chief Complaint: CHEST PAIN,STOMACH PAIN, BACK PAIN    Feb 05, 2019 [facility] [place] [place] [facility]      Other long term (current) drug therapy      Peptic ulcer, site unspecified, unspecified as acute or chron      Allergy status to analgesic agent status      Personal history of other diseases of the digestive system      Colostomy status      Epigastric pain      Gastro-esophageal reflux disease without esophagitis      Chronic obstructive pulmonary disease, unspecified      Jan 14, 2019 [name] [place] [facility]  Chief Complaint: ABD PAIN    Dec 31, 2018 [facility] [place] [facility]      Unspecified abdominal pain      Peptic ulcer, site unspecified, unspecified as acute or chron      Chronic obstructive pulmonary disease, unspecified      Chest pain, unspecified      Dec 03, 2018 [facility]. [place] [facility]      1. Epigastric pain      2. Chest Pain      3. 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 [place] [facility]  Chief Complaint: CHEST PAIN    Oct 29, 2018 [name] [place] [facility]      Personal history of peptic ulcer disease      Colostomy status      Other specified postprocedural states      Allergy status to analgesic agent status      Nicotine dependence, cigarettes, uncomplicated      Other chest pain      Unspecified abdominal pain      Low back pain      Gastro-esophageal reflux disease without esophagitis      Heart failure, unspecified          Recent Inpatient Visit Summary  Date Facility [facility] Type Diagnoses or Chief Complaint   Jun 20, 2019 [facility] [place] [place] [facility]      2. 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 Peptic ulcer, site unspecified, unspecified as acute or chron      3. Epigastric pain      4. Abdominal Pain      Feb 18, 2019 [name] [place] Tele[facility]      Anemia, unspecified      Hyperlipidemia, unspecified      Chronic pain syndrome      Acute ischemic heart disease, unspecified Other long term (current) drug therapy      Nicotine dependence, cigarettes, uncomplicated      Other specified disorders of bladder      Colostomy status      Atherosclerosis of aorta      Bipolar disorder, unspecified      Nov 20, 2018 [facility] [place] [place] Tele[facility]      Other chest pain      Colostomy status      Gastro-esophageal reflux disease without esophagitis      Major depressive disorder, single episode, unspecified      Other specified counseling      Chronic obstructive pulmonary disease, unspecified      Nicotine dependence, cigarettes, uncomplicated      Gastric ulcer, unspecified as acute or chronic, without hemor      Allergy status to other drugs, medicaments and biological sub      Anxiety disorder, unspecified      Sep 22, 2018 [facility] [place] [place] [facility]      Essential (primary) hypertension      Major depressive disorder, single episode, unspecified      Generalized anxiety disorder      Emphysema, unspecified      Fibromyalgia      Unspecified asthma, uncomplicated      Pure hypercholesterolemia, unspecified      Nicotine dependence, cigarettes, uncomplicated      Gastro-esophageal reflux disease without esophagitis      Other chest pain      Aug 02, 2018 [facility] [place]. [place] Tele[facility]      Adult hypertrophic pyloric stenosis      Personal history of peptic ulcer disease      Diaphragmatic hernia without obstruction or gangrene      Nicotine dependence, cigarettes, uncomplicated      Colostomy status      Chronic obstructive pulmonary disease with (acute) exacerbati      Unspecified abdominal pain      Essential (primary)  hypertension      Gastritis, unspecified, without bleeding      Gastro-esophageal reflux disease without esophagitis          Care Team  [facility] Phone Fax Service Dates   Coy Saunas , M.D. Family Medicine [phone] Current    Brain Hilts , M.D. Family Medicine   Current Coy Saunas [facility]  [phone] Nov 20, 2018 - Current    [facility] Primary Care   Current    [facility]   Current    [facility] Primary Care [phone] Aug 21, 2018 - Current      Collective Portal  This patient has registered at the [facility] Emergency Department   For more information visit: [email or web address]   PLEASE NOTE:     1.   Any care recommendations and other clinical information are provided as guidelines or for historical purposes only, and providers should exercise their own clinical judgment when providing care.    2.   You may only use this information for purposes of treatment, payment or health care operations activities, and subject to the limitations of applicable [employer] Policies.    3.   You should consult directly with the organization that provided a care guideline or other clinical history with any questions about additional information or accuracy or completeness of information provided.    ? 2020 [place], [employer]. - [email or web address]

## 2019-07-12 NOTE — H&P
INPATIENT CONSULT/ GENERAL SURGERY / [place] [facility]ICE [phone]    PATIENT:  Donna Gross  MRN:  [id number]  DOB:  Aug 20, 1966  DATE OF SERVICE:  07/12/2019    GENERAL SURGERY ATTENDING PHYSICIAN:  Prentice Docker, MD  REFERRING PHYSICIAN:  Elsie Stain., MD        LOS: 0 days     REASON FOR CONSULT: neck pain, emesis, recently post-operative    HPI:  Donna Gross is a 53 y.o. female with a past medical history complex peptic ulcer disease s/p multiple surgeries now s/p scheduled laparoscopic vagotomy and antrectomy, gastrojejunostomy, and hiatal hernia repair (06/21/19; POD21). Patient was discharged 07/02/19 in good condition after resolution of swallowing pain that patient first reported on POD8 (06/29/19). She presents now with complaints of right neck pain and emesis.    Pt reports being discharged 10 days ago. Reports she has pain in her right lateral neck since immediately after surgery and prior to discharge that have caused pain with swallowing and sensation like food gets stuck in her right neck. Knows food is not stuck there but it feels like it is. The pain is dull and present at all times but is worsened by eating or drinking. Does not describe sensation of food feeling like it gets stuck in her throat/chest. She is able to swallow and get food down but then it ''comes back up''. Her emesis is usually just the food she has swallowed. Patient reports pain was getting better in the hospital prior to discharge to the point she was comfortable getting home. Since discharge, pt reports only eating soup, jello, and other limited chew items at home. Pt thinks the pain now is the same as what she was experiencing during her post-operative hospitalization that caused pain with swallowing.     Has had prior EGD to diagnose PUD, but has never had endoscopy to look at her throat. No interventions or study since recent discharge.    Patient also reports still having pain in LUQ near her stomach, though no pain to touch. This has been improving significantly since surgery and patient feels this is tolerable and is not of concern to her. Denies fevers, chills. Stools are soft, occasionally runny. No constipation. No issues with voiding. Has recently started walking on her block short distances and is tolerating this well. No other complaints at this time.    Patient reports taking recently prescribed oxycodone, dilaudid and sucralfate in addition to taking regular home meds: protonix, inhaler, muscle relaxer.    General surgery consulted to evaluate patient given recently post-operative with similar complaint.    ALLERGIES:   Allergies   Allergen Reactions   ??? Hydrocodone Other (See Comments)     ''burns a hole in stomach''   ??? Ibuprofen Other (See Comments)     GI discomfort    ''hurts my stomach''       MEDICATIONS:  No current facility-administered medications for this encounter.      Current Outpatient Medications   Medication Sig   ??? albuterol (2.5 mg/65mL) 0.083% nebulizer solution Take 2.5 mg by nebulization every eight (8) hours as needed (SOB) .   ??? albuterol 90 mcg/act inhaler Inhale 2 puffs every eight (8) hours as needed (SOB).   ??? aluminum-magnesium hydroxide-simethicone (ALMACONE) 200-200-20 mg/5 mL suspension Take 5 mLs by mouth three (3) times daily.   ??? aluminum-magnesium hydroxide-simethicone 400-400-40 mg/5 mL suspension Take 10 mLs by mouth every four (4) hours as needed.   ??? clobetasol 0.05%  cream Apply topically three (3) times daily as needed (rash).   ??? cyclobenzaprine 10 mg tablet Take 10 mg by mouth two (2) times daily as needed for Muscle spasms.   ??? dicyclomine 20 mg tablet Take 0.5 tablets (10 mg total) by mouth two (2) times daily.   ??? fluticasone-salmeterol (WIXELA INHUB) 250-50 mcg/dose diskus Inhale 1 puff two (2) times daily as needed (SOB).   ??? HYDROmorphone 2 mg tablet Take 1 tablet (2 mg total) by mouth every four (4) hours as needed for Severe Pain (Pain Scale 7-10) (please try oxycodone first (wait at least 1 hour after oxycodone administration), only if not yet due for next oxycodone. 2nd line). Max Daily Amount: 12 mg   ??? ipratropium-albuterol 20-100 mcg/act inhaler Inhale 2 puffs three (3) times daily as needed (SOB).   ??? lidocaine 5% patch Place 1 patch onto the skin daily 12 hours on 12 hours off.Marland Kitchen   ??? lidocaine viscous 2% oral solution Swish and spit 5 mLs three (3) times daily.   ??? menthol 5 mg lozenge Use as directed 1 lozenge (5 mg total) in the mouth or throat every four (4) hours as needed for Cough or Sore Throat.   ??? Naloxone HCl 4 MG/0.1ML LIQD Call 911. Administer a single spray intranasally into one nostril for opioid overdose. May repeat in 3 minutes if patient is not breathing.Marland Kitchen   ??? ondansetron 4 mg tablet Take 1 tablet (4 mg total) by mouth every six (6) hours as needed for Nausea.   ??? oxyCODONE 5 mg/5 mL solution Take 20 mLs (20 mg total) by mouth every four (4) hours as needed. Max Daily Amount: 120 mg   ??? PANTOPRAZOLE SODIUM PO Take 40 mg by mouth daily.   ??? promethazine 6.25 mg/5 mL solution Take 6.25 mg by mouth every six (6) hours as needed (cough).   ??? quetiapine 100 mg tablet Take 1 tablet (100 mg total) by mouth two (2) times daily.   ??? sucralfate 1 g/10 mL suspension Take 10 mLs (1 g total) by mouth three (3) times daily with meals and at bedtime.       PAST MEDICAL HISTORY:   Past Medical History:   Diagnosis Date   ??? Anxiety    ??? Depression    ??? Emphysema, unspecified (HCC/RAF)    ??? Fibromyalgia    ??? Gastritis    ??? GERD (gastroesophageal reflux disease)    ??? Pancreatitis        PAST SURGICAL HISTORY:  Past Surgical History:   Procedure Laterality Date   ??? ABDOMINAL SURGERY     ??? COLON SURGERY         FAMILY HISTORY:  Family History   Problem Relation Age of Onset   ??? Heart disease Mother    ??? Diabetes Brother    ??? Diabetes Father    ??? Anesthesia problems Neg Hx    ??? Malignant hypertension Neg Hx    ??? Hypotension Neg Hx    ??? Malignant hyperthermia Neg Hx ??? Pseudochol deficiency Neg Hx        SOCIAL HISTORY:  Social History     Tobacco Use   ??? Smoking status: Current Every Day Smoker     Packs/day: 0.25     Types: Cigarettes   ??? Smokeless tobacco: Current User   ??? Tobacco comment: 30 years    Substance Use Topics   ??? Alcohol use: No   ??? Drug use: No  REVIEW OF SYSTEMS: A 14 point review of systems has been completed and are negative, except for what is noted in the HPI.     PHYSICAL EXAM:  Vital Signs:  Temp:  [36 ???C (96.8 ???F)-37.1 ???C (98.7 ???F)] 36.8 ???C (98.2 ???F)  Heart Rate:  [63-91] 71  Resp:  [18-23] 18  BP: (126-134)/(75-102) 134/75  NBP Mean:  [110-112] 110  SpO2:  [98 %-100 %] 100 %  Vitals:    07/12/19 1439   Weight: 137 lb (62.1 kg)         GENERAL: Comfortable, NAD.  HEENT: Normocephalic, atraumatic, conjunctiva/corneas clear, moist mucous membranes, no oral ulcerations/lesions. Neck is supple, soft, no masses or lesions, and nontender to palpation.  CARDIOVASCULAR: Regular rate and rhythm, no ectopy on monitor.  PULMONARY: Normal respiratory excursions, not tachypneic, no labored breathing. No adventitious breath sounds.  GI: Non-distended, soft, nontender, no guarding, no rebound. Incisions from prior surgery clean/dry/intact, nontender to palpation.  EXT: WWP, no c/c/e  NEURO: A&Ox3, moving all ext.  CENTRAL LINE:  None  DRAINS: None    DETAILED I&O:  No intake/output data recorded.  No intake or output data in the 24 hours ending 07/12/19 1506     LAB REVIEW:    WBC/Hgb/Hct/Plts:  6.51/10.5/33.6/810 (07/22 1118)   Na/K/Cl/CO2/BUN/Cr/glu:  136/5.0/102/18/11/0.93/80 (07/22 1118)   Lab Results   Component Value Date    ALT 14 07/12/2019    AST 31 07/12/2019    ALKPHOS 107 07/12/2019    BILITOT <0.2 07/12/2019     PT/INR/APTT/Fib:  12.8/1.0/--/-- (07/22 1118)         MICRO:  Recent Labs     07/12/19  1118   WBC 6.51     No results found for this or any previous visit (from the past 336 hour(s)).    IMAGING/STUDIES: Below images were reviewed with surgical team.  Xr Abdomen Ap+erect+supine+pa Chest (3 Views)    Result Date: 07/12/2019  XR ABD AP ERECT SUPINE W PA CHEST CLINICAL HISTORY: pain. COMPARISON: Chest radiograph from 06/29/2019. FINDINGS: Bowel sutures noted in the upper abdomen. Bowel gas pattern is nonobstructive. No intraperitoneal free air. Normal cardiomediastinal silhouette. Atherosclerotic calcification of the thoracic aorta. Linear atelectasis in the left base. Nodular opacity projecting the left base, most consistent with nipple shadow. No consolidation. Interval resolution of left pleural effusion. No pneumothorax. No acute osseous abnormalities. Atherosclerotic vascular calcification in the abdominal aorta and iliac vessels      IMPRESSION: Postsurgical changes in the upper abdomen. Normal abdominal bowel gas pattern. No intraperitoneal free air. No acute cardiopulmonary disease. Signed by: Albin Fischer   07/12/2019 12:05 PM    Ct Abd+pelvis W Contrast    Result Date: 06/29/2019  CT ABD+PELVIS W CONTRAST CLINICAL HISTORY: recent abdominal surgery, new leukocytosis, Post-op. COMPARISON: March 09, 2019 December 20, 2015 TECHNIQUE: On a multirow-detector CT scanner, a volumetric contrast enhanced scan was performed through the abdomen and pelvis. CONTRAST: iohexol (Omnipaque) 350 mg/mL inj 125 mL EXAM DOSAGE: The patient received the following exposure event(s) during this study, and the dose reference values for each are as shown (CTDIvol in mGy, DLP in mGy-cm). Note that the values are not patient dose but numbers generated from scan acquisition factors based on 32 cm (L) and/or 16 cm (S) phantoms and may substantially under-estimate or over-estimate actual patient dose based on patient size and other factors. PreMonitoring, CTDI(L): 4, DLP: 3.9;Monitoring, CTDI(L): 4, DLP: 3.9;Abd/Pel wc, CTDI(L): 14.6, DLP: 738.1 FINDINGS: Lung bases: Small left  and trace right pleural effusion, with atelectasis at the lung bases.. Liver: Unremarkable. Gallbladder and bile ducts: Distended gallbladder without pericholecystic inflammatory change. Mild intrahepatic biliary dilation with common bile duct tapering to the ampulla, not significant change from CT December 20, 2015.Marland Kitchen Spleen: Unremarkable. Pancreas: Unremarkable. Adrenals: Unremarkable. Kidneys and ureters: Unremarkable. Bowel: Status post antrectomy, gastrojejunostomy, and hiatal hernia repair. Surgical drain adjacent to the stomach from right lower quadrant approach. Globular hyperdensity noted at the gastroesophageal junction (5-28) no fluid collections. Nondilated bowel. The distal esophagus is mildly thickened.. Bladder: Unremarkable. Reproductive organs: Unremarkable. Lymph nodes: Unremarkable. Peritoneum: Mild mesenteric edema. Vessels: Moderate atherosclerotic vascular calcifications including coronary calcifications. Abdominal wall: Postsurgical changes in the anterior abdominal wall and mild subcutaneous edema. Bones: No suspicious osseous lesions..     IMPRESSION: Postsurgical changes. No drainable fluid collections demonstrated. No bowel obstruction. There is a small globular hyperdensity noted adjacent to the gastroesophageal junction, indeterminate origin but probably postsurgical material. Signed by: Billee Cashing   06/29/2019 9:26 AM    Xr Chest Ap Portable (1 View)    Result Date: 06/29/2019  XR CHEST AP 1V PORTABLE   06/29/2019 6:10 AM COMPARISON: 06/24/2019 0851 hours HISTORY: difficulty swallowing s/p hiatal hernia repair     IMPRESSION: Normal cardiac silhouette. Improved aeration within the lung parenchyma with improved bibasilar opacities, likely atelectasis. Small left greater than right pleural effusions. Abdominal surgical drain. No significant osseous abnormalities. Signed by: Huel Coventry   06/29/2019 8:18 PM    Xr Chest Ap Portable (1 View)    Result Date: 06/24/2019  XR CHEST AP 1V PORTABLE  CLINICAL HISTORY: Shortness of breath. COMPARISON: 06/21/2019.     IMPRESSION:  Epidural catheter is present. NG/OG is removed. Upper abdominal surgical drains. Allowing for low lung volume the cardiomediastinal silhouette is stable. Mild interstitial pulmonary edema and vascular congestion. Interval increase in small left more than right bilateral pleural effusions with associated atelectasis/consolidation. Decrease in left lateral chest wall subcutaneous emphysema. Signed by: Floy Sabina   06/24/2019 11:42 AM    Xr Kub Portable (1 View)    Result Date: 06/20/2019  XR KUB 1V PORTABLE CLINICAL HISTORY: pt feel distended, n/v. COMPARISON: CT abdomen and pelvis dated March 09, 2019.     IMPRESSION: Moderate stool burden. Normal bowel gas pattern. No intraperitoneal free air. No acute osseous abnormality. I, Quentin Cornwall, M.D., have reviewed this radiological study personally and I am in full agreement with the findings of the report presented here. Dictated by: Carolina Cellar   06/20/2019 4:12 PM Signed by: Quentin Cornwall   06/20/2019 4:31 PM    Xr Chest For Feeding Tube Placement (1 View)    Result Date: 06/22/2019  EXAM:  XR CHEST FOR FEEDING TUBE PLACEMENT 1V  06/21/2019 COMPARISON: March 09, 2019 INDICATION:   Tube Position     IMPRESSION: Epidural catheter is present. An enteric tube coursing below diaphragm with the tip projecting over mid gastric body. Upper abdominal surgical drains. Allowing for low lung volume the cardiomediastinal silhouette is stable. Mild interstitial pulmonary edema and vascular congestion. Left chest and abdominal wall moderate subcutaneous emphysema. Signed by: Benjamine Sprague   06/22/2019 9:12 AM      ASSESSMENT/PLAN:  Porcia Baldonado is a 53 y.o. female with past medical history complex peptic ulcer disease s/p multiple surgeries now s/p scheduled laparoscopic vagotomy and antrectomy, gastrojejunostomy, and hiatal hernia repair (06/21/19; POD21). She presents now with complaints of right neck pain and emesis which patient also reported 8 days after surgery but  which resolved prior to discharge. This is of unclear etiology with no clear relationship to prior surgery and cannot be elicited on exam.    We recommend the following:   -  ***  - Please page [phone] (surgery, [place] team) with any questions.     The patient was examined and discussed with General Surgery team & Surgical Attending [name] who agrees with assessment and plans listed above.    Author:  Remi Deter, MD  3:06 PM 07/12/2019  Division of General Surgery  [facility]

## 2019-07-12 NOTE — Telephone Encounter
Called the patient back - she was in the ED this am for nausea and vomiting - unable to tolerate PO because she feels food is getting stuck. She was given Zofran and Lidocaine and discharged - patient called the office and in severe pain 10/10 and cannot eat or drink - advised her to go back to the ED and have surgery evaluate her - informed the team and ED.

## 2019-07-12 NOTE — ED Provider Notes
[facility]  Emergency Department Service Report    Donna Gross 53 y.o. female , presents with Emesis      Triage   Arrived on 07/12/2019 at 2:35 PM   Arrived by Walk-in [14]    ED Triage Vitals   Temp Temp Source BP Heart Rate Resp SpO2 O2 Device Pain Score Weight   07/12/19 1436 07/12/19 1436 07/12/19 1436 07/12/19 1436 07/12/19 1436 07/12/19 1436 07/12/19 1436 07/12/19 1440 07/12/19 1439   36.8 ???C (98.2 ???F) Oral 134/75 71 18 100 % None (Room air) Ten 62.1 kg (137 lb)       Pre hospital care:       Allergies   Allergen Reactions   ??? Hydrocodone Other (See Comments)     ''burns a hole in stomach''   ??? Ibuprofen Other (See Comments)     GI discomfort    ''hurts my stomach''       History   Patient is a 53 y.o. female with hx of anxiety, depression, emphysema, fibromyalgia, gastritis, GERD, pancreatitis, and ulcer and hernia repair 21 days ago (discharged 10 days ago) who presents to the ED with complaint of acute onset non-bloody, non-bilious vomiting for the past 2 days. Vomiting is frequent, preventing the patient from eating or drinking and no alleviating or exacerbating factors are noted. Associated sx include pain when swallowing, LUQ abdominal pain, and R sided anterior neck pain. Patient denies fever, cough, and SOB. Patient was seen here this morning and discharged but sent back by surgery due to inability to tolerate POs.      The history is provided by the patient. No language interpreter was used.   Emesis    This is a new problem. The current episode started 2 days ago. The problem occurs 5 to 10 times per day. The problem has not changed since onset.The emesis has an appearance of stomach contents. There has been no fever. Associated symptoms include abdominal pain (LUQ). Pertinent negatives include no fever.            Past Medical History:   Diagnosis Date   ??? Anxiety    ??? Depression    ??? Emphysema, unspecified (HCC/RAF)    ??? Fibromyalgia    ??? Gastritis ??? GERD (gastroesophageal reflux disease)    ??? Pancreatitis         Past Surgical History:   Procedure Laterality Date   ??? ABDOMINAL SURGERY     ??? COLON SURGERY          Past Family History   family history includes Diabetes in her brother and father; Heart disease in her mother.                 Past Social History   she reports that she has been smoking cigarettes. She has been smoking about 0.25 packs per day. She uses smokeless tobacco. She reports that she does not drink alcohol, use drugs, or engage in sexual activity.     Review of Systems   Constitutional: Negative for fever.   Gastrointestinal: Positive for abdominal pain (LUQ), nausea and vomiting.   Musculoskeletal: Positive for neck pain (R side).   All other systems reviewed and are negative.      Physical Exam   Physical Exam  Vitals signs and nursing note reviewed.   Constitutional:       General: She is not in acute distress.     Appearance: She is well-developed.   HENT:  Head: Normocephalic and atraumatic.   Eyes:      Conjunctiva/sclera: Conjunctivae normal.   Neck:      Musculoskeletal: Normal range of motion and neck supple.   Cardiovascular:      Rate and Rhythm: Normal rate and regular rhythm.   Pulmonary:      Effort: Pulmonary effort is normal. No respiratory distress.      Breath sounds: No stridor.      Comments: Able to swallow and tolerate secretions  Abdominal:      Palpations: Abdomen is soft.      Tenderness: There is abdominal tenderness (epigastric). There is no guarding or rebound.      Comments: Multiple surgical scars   Musculoskeletal: Normal range of motion.   Lymphadenopathy:      Cervical: No cervical adenopathy.   Skin:     General: Skin is warm and dry.   Neurological:      Mental Status: She is alert.      Sensory: No sensory deficit.      Comments: Moving all four extremities.   Psychiatric:         Behavior: Behavior normal.         ED Course          Laboratory Results   Labs Reviewed - No data to display Imaging Results     No orders to display       Administered Medications     Medication Administration from 07/12/2019 1436 to 07/12/2019 1606       Date/Time Order Dose Route Action Action by Comments     07/12/2019 1526 sodium chloride 0.9% IV soln bolus 1,000 mL 1,000 mL Intravenous New Bag/ Syringe/ Cartridge Avie Echevaria [place], [place]      07/12/2019 1528 HYDROmorphone 1 mg/mL inj 0.5 mg 0.5 mg IV Push Given Myrtie Hawk, RN           Procedures   Procedures    MDM  Number of Diagnoses or Management Options     Amount and/or Complexity of Data Reviewed  Decide to obtain previous medical records or to obtain history from someone other than the patient: yes  Review and summarize past medical records: yes  Discuss the patient with other providers: yes    ED Course:  Nursing note reviewed.  Previous medical records were obtained and reviewed by myself. They reveal a history of anxiety, depression, emphysema, fibromyalgia, gastritis, GERD, pancreatitis, and ulcer and hernia repair 21 days ago (discharged 10 days ago).    1501: I visited the patient to obtain history and perform the physical exam. IV access was secured.  Orders placed for CT neck w/ contrast.  Patient will be administered HYDROmorphone 1 mg/mL inj 0.5 mg and IV fluids 1L.    Radiology Results (as interpreted by me):   CT chest w/ contrast shows ***    1620: On reassessment, the patient remains grossly hemodynamically stable.    3:21 PM Consult with general surgery. Spoke to Dr. Vonita Moss about the patient???s presenting symptoms and exam findings.     3:34 PM Consult with ENT. Spoke to Dr. Katrinka Blazing about the patient???s presenting symptoms and exam findings.     Medical Decision Making:  Donna Gross presents to the ED today with chief complaint of emesis.  On exam, the patient exhibits epigastric abdominal tenderness, otherwise normal exam.     Given the patient's physical exam, laboratory analysis, and imaging studies, my impression is the patient is experiencing an  episode of odynophagia and peptic ulcer disease. Given the above findings and discussion, I strongly believe the patient will greatly benefit from an inpatient admission for further evaluation and treatment.    Data Reviewed/Counseling: I have reviewed the patient's vital signs, nursing notes and old medical records. I had a detailed discussion with the patient regarding the historical points, exam findings, and any diagnostic results supporting the admit diagnosis. I also discussed radiology results and the need for further workup and treatment in the hospital.          Clinical Impression     1. Odynophagia    2. Peptic ulcer disease        Prescriptions     New Prescriptions    No medications on file       Disposition and Follow-up   Disposition: Observation [10]    Future Appointments   Date Time Provider Department Center   07/14/2019  1:30 PM Alonna Buckler., MD SUR GEN 102 SURGERY       Follow up with:  No follow-up provider specified.    Return precautions are specified on After Visit Summary.          The documentation on this chart was performed by Pam Drown, scribed for Elsie Stain., MD    07/12/2019 3:11 PM     ***

## 2019-07-12 NOTE — ED Notes
Pt to CT

## 2019-07-12 NOTE — ED Notes
COLLECTIVE?NOTIFICATION?07/12/2019 14:35?Alethia Berthold?MRN: [id number]    [name]:   IHK:[id number]  Account [id number]  Billing Account [id number]      Criteria Met      6 Visits in 180 Days    History of Sepsis Dx    2 Visits in 30 Days    Security and Safety  No recent Security Events currently on file    ED Care Guidelines  There are currently no ED Care Guidelines for this patient. Please check your facility's medical records system.    Flags      History of Sepsis - Patient has received a diagnosis of Sepsis from an acute or post-acute setting. Apply appropriate clinical planning practices; to learn more visit [email or web address] / Attributed By: [facility] / Attributed On: 12/22/2018           E.D. Visit Count (12 mo.)  Facility Visits   [facility] [place] 1   [name] 7   [facility] 4   [facility] [place] 1   Total 13   Note: Visits indicate total known visits.      Recent Emergency Department Visit Summary  Showing 10 most recent visits out of 13 in the past 12 months  Date Facility [facility] Type Diagnoses or Chief Complaint   Jul 12, 2019 [facility] [place] [facility]      1. Emesis      Jul 12, 2019 [facility] [place]. [place] [facility]      1. Post-op Problem      1. Unspecified abdominal pain      Mar 09, 2019 [facility]. [place] [facility]      1. Duodenal ulcer, unspecified as acute or chronic, without hemo      2. Abdominal Pain      Feb 18, 2019 [name] [place] [facility]  Chief Complaint: CHEST PAIN,STOMACH PAIN, BACK PAIN    Feb 05, 2019 [facility] [place]. [place] [facility]      Other long term (current) drug therapy      Peptic ulcer, site unspecified, unspecified as acute or chron      Allergy status to analgesic agent status      Personal history of other diseases of the digestive system      Colostomy status      Epigastric pain Gastro-esophageal reflux disease without esophagitis      Chronic obstructive pulmonary disease, unspecified      Jan 14, 2019 [name] [place] [facility]  Chief Complaint: ABD PAIN    Dec 31, 2018 [facility] [place] [facility]      Unspecified abdominal pain      Peptic ulcer, site unspecified, unspecified as acute or chron      Chronic obstructive pulmonary disease, unspecified      Chest pain, unspecified      Dec 03, 2018 [facility] [place] [facility]      1. Epigastric pain      2. Chest Pain      3. 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 [place] [facility]  Chief Complaint: CHEST PAIN        Recent Inpatient Visit Summary  Date Facility [facility] Type Diagnoses or Chief Complaint   Jun 20, 2019 [facility] [place] [facility]      2. 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 Peptic ulcer, site unspecified, unspecified as acute or chron      3. Epigastric pain      4. Abdominal Pain      Feb 18, 2019 [name] [place] [facility]      Anemia, unspecified      Hyperlipidemia, unspecified      Chronic pain syndrome      Acute ischemic heart disease, unspecified      Other long term (current) drug therapy      Nicotine dependence, cigarettes, uncomplicated      Other specified disorders of bladder      Colostomy status      Atherosclerosis of aorta      Bipolar disorder, unspecified Nov 20, 2018 [facility] [place]. [place] [facility]      Other chest pain      Colostomy status      Gastro-esophageal reflux disease without esophagitis      Major depressive disorder, single episode, unspecified      Other specified counseling      Chronic obstructive pulmonary disease, unspecified      Nicotine dependence, cigarettes, uncomplicated      Gastric ulcer, unspecified as acute or chronic, without hemor      Allergy status to other drugs, medicaments and biological sub      Anxiety disorder, unspecified      Sep 22, 2018 [facility] [place]. [place] [facility]      Essential (primary) hypertension      Major depressive disorder, single episode, unspecified      Generalized anxiety disorder      Emphysema, unspecified      Fibromyalgia      Unspecified asthma, uncomplicated      Pure hypercholesterolemia, unspecified      Nicotine dependence, cigarettes, uncomplicated      Gastro-esophageal reflux disease without esophagitis      Other chest pain      Aug 02, 2018 [facility] [place]. [place] [facility]      Adult hypertrophic pyloric stenosis      Personal history of peptic ulcer disease      Diaphragmatic hernia without obstruction or gangrene      Nicotine dependence, cigarettes, uncomplicated      Colostomy status      Chronic obstructive pulmonary disease with (acute) exacerbati      Unspecified abdominal pain      Essential (primary) hypertension      Gastritis, unspecified, without bleeding      Gastro-esophageal reflux disease without esophagitis          Care Team  [facility] Phone Fax Service Dates   Coy Saunas , M.D. Family Medicine [phone] Current    PEREZ, JOSE , M.D.  Family Medicine   Current    [facility] Saunas [facility]  [phone] Nov 20, 2018 - Current    [facility] Primary Care   Current    [facility] Primary [facility]   Current    [facility] Primary Care [phone] Aug 21, 2018 - Current      [facility] Portal  This patient has registered at the [facility] Emergency Department   For more information visit: [email or web address]   PLEASE NOTE:     1.   Any care recommendations and other clinical information are provided as guidelines or for historical purposes only, and providers should exercise their own clinical judgment when providing care.    2.   You may only use this information for purposes of treatment, payment or health care operations activities, and subject to the limitations of applicable [employer] Policies.    3.   You should consult directly with the organization that provided a care guideline or other clinical history with any questions about additional information or accuracy or completeness of information provided.    ? 2020 [place], [employer] - [employer]

## 2019-07-12 NOTE — Telephone Encounter
Call Back Request    MD:  Bridgett Larsson    Reason for call back:  Pt is requesting to discuss symptoms with Dr. Bridgett Larsson. Pt is aware she has a scheduled appt for Friday, 07/14/19. Please assist and advise. Thank you.    Any Symptoms:  [x]  Yes  []  No      o If yes, what symptoms are you experiencing:  Difficulty swallowing / unable to eat / pain and weakness - pain scale: 10/10  o Duration of symptoms (how long):  X 10 days  o Have you taken medication for symptoms (OTC or Rx):  Medications given by Dr. Bridgett Larsson.     Patient or caller has been notified of the 24-48 hour turnaround time.

## 2019-07-12 NOTE — ED Notes
Initial contact:    Pt to bed 8 with C/O post op problem   Had surgery for hernia and ulcer on 7/1  Was DC'd on 7/12  Has been having pain in throat for a few days now  States she has been unable to keep any food or liquids down, vomits everytime after eating   Also reports abd and back pain   Rates pain 10/10  Does endorse SOB   Denies any cough or fever, last tested for COVID on 7/1, negative   Denis any diarrhea   Pt A/Ox4 and in NAD  VSS

## 2019-07-12 NOTE — ED Notes
Pt returned after being Bent earlier today   Reports still being unable to eat or drink W/O vomiting   Was told by surgeon to come back into ED

## 2019-07-12 NOTE — ED Notes
Patient was given detailed verbal and written discharge instructions concerning their stay in the emergency room. Patient was encouraged to read the hand-outs that were given to them and to refer to them for information concerning their diagnosis and treatment. Patient educated on prescriptions provided in ED and instructed to take all medications as prescribed. Patient verbalizes understanding and is agreeable to discharge with no further questions. Patient instructed to return to the emergency room for any new or worsening symptoms. Patient departed ED in no apparent distress.

## 2019-07-12 NOTE — ED Notes
Report received from Ashley RN for 1 hour lunch.

## 2019-07-13 ENCOUNTER — Ambulatory Visit: Payer: PRIVATE HEALTH INSURANCE

## 2019-07-13 LAB — CBC: RED CELL DISTRIBUTION WIDTH-CV: 15.7 — ABNORMAL HIGH (ref 11.1–15.5)

## 2019-07-13 LAB — Basic Metabolic Panel
ANION GAP: 15 mmol/L (ref 8–19)
CHLORIDE: 108 mmol/L — ABNORMAL HIGH (ref 96–106)
GFR ESTIMATE FOR AFRICAN AMERICAN: 77 mL/min/{1.73_m2} (ref 8–19)

## 2019-07-13 LAB — COVID-19 PCR: COVID-19 PCR: NOT DETECTED

## 2019-07-13 MED ADMIN — HYDROMORPHONE HCL 2 MG PO TABS: 2 mg | ORAL | @ 21:00:00 | Stop: 2019-07-15 | NDC 42858030125

## 2019-07-13 MED ADMIN — BARIUM SULFATE 40 % PO PSTE: 230 mL | ORAL | @ 18:00:00 | Stop: 2019-07-13 | NDC 32909012522

## 2019-07-13 MED ADMIN — LIDOCAINE HCL 4 % EX SOLN: 50 mL | TOPICAL | @ 14:00:00 | Stop: 2019-07-13 | NDC 00054350547

## 2019-07-13 MED ADMIN — BARIUM SULFATE 60 % PO CREA: ORAL | @ 18:00:00 | Stop: 2019-07-13 | NDC 32909077001

## 2019-07-13 MED ADMIN — SUCRALFATE 1 GM/10ML PO SUSP: 1 g | ORAL | @ 17:00:00 | Stop: 2019-07-15 | NDC 68094004361

## 2019-07-13 MED ADMIN — ZZ IMS TEMPLATE: 150 mg | ORAL | @ 16:00:00 | Stop: 2019-07-15 | NDC 00904664061

## 2019-07-13 MED ADMIN — SUCRALFATE 1 GM/10ML PO SUSP: 1 g | ORAL | Stop: 2019-07-15 | NDC 68094004361

## 2019-07-13 MED ADMIN — HYDROMORPHONE HCL 2 MG PO TABS: 2 mg | ORAL | @ 17:00:00 | Stop: 2019-07-15 | NDC 42858030125

## 2019-07-13 MED ADMIN — LACTATED RINGERS IV BOLUS: 500 mL | INTRAVENOUS | @ 14:00:00 | Stop: 2019-07-13 | NDC 00338011704

## 2019-07-13 MED ADMIN — OXYCODONE HCL 5 MG/5ML PO SOLN: 20 mg | ORAL | @ 22:00:00 | Stop: 2019-07-15 | NDC 66689040150

## 2019-07-13 MED ADMIN — OXYCODONE HCL 5 MG/5ML PO SOLN: 20 mg | ORAL | @ 03:00:00 | Stop: 2019-07-15 | NDC 66689040150

## 2019-07-13 MED ADMIN — LACTATED RINGERS IV BOLUS: 500 mL | INTRAVENOUS | @ 08:00:00 | Stop: 2019-07-13 | NDC 00338011704

## 2019-07-13 MED ADMIN — OXYCODONE HCL 5 MG/5ML PO SOLN: 20 mg | ORAL | @ 09:00:00 | Stop: 2019-07-15 | NDC 66689040150

## 2019-07-13 MED ADMIN — LIDOCAINE 5 % EX PTCH: 1 | TRANSDERMAL | Stop: 2019-07-15 | NDC 00591352530

## 2019-07-13 MED ADMIN — OXYMETAZOLINE HCL 0.05 % NA SOLN: 2 | NASAL | @ 14:00:00 | Stop: 2019-07-13 | NDC 11523116706

## 2019-07-13 MED ADMIN — LIDOCAINE 5 % EX PTCH: 1 | TRANSDERMAL | @ 03:00:00 | Stop: 2019-07-15 | NDC 00591352530

## 2019-07-13 MED ADMIN — PANTOPRAZOLE SODIUM 40 MG IV SOLR: 40 mg | INTRAVENOUS | @ 04:00:00 | Stop: 2019-07-15 | NDC 00143928410

## 2019-07-13 MED ADMIN — LIDOCAINE 5 % EX PTCH: 1 | TRANSDERMAL | @ 16:00:00 | Stop: 2019-07-15 | NDC 00591352530

## 2019-07-13 MED ADMIN — BARIUM SULFATE 40 % PO SUSR: ORAL | @ 18:00:00 | Stop: 2019-07-13 | NDC 32909010510

## 2019-07-13 MED ADMIN — LIDOCAINE 5 % EX PTCH: 1 | TRANSDERMAL | @ 13:00:00 | Stop: 2019-07-15

## 2019-07-13 MED ADMIN — SUCRALFATE 1 GM/10ML PO SUSP: 1 g | ORAL | @ 02:00:00 | Stop: 2019-07-15 | NDC 68094004361

## 2019-07-13 MED ADMIN — SUCRALFATE 1 GM/10ML PO SUSP: 1 g | ORAL | @ 04:00:00 | Stop: 2019-07-15 | NDC 68094004361

## 2019-07-13 MED ADMIN — LIDOCAINE 5 % EX PTCH: 1 | TRANSDERMAL | @ 16:00:00 | Stop: 2019-07-15

## 2019-07-13 MED ADMIN — OXYCODONE HCL 5 MG/5ML PO SOLN: 20 mg | ORAL | @ 16:00:00 | Stop: 2019-07-15 | NDC 66689040150

## 2019-07-13 MED ADMIN — OXYCODONE HCL 5 MG/5ML PO SOLN: 10 mg | ORAL | Stop: 2019-07-13 | NDC 66689040150

## 2019-07-13 MED ADMIN — ZZ IMS TEMPLATE: 150 mg | ORAL | @ 04:00:00 | Stop: 2019-07-15 | NDC 00904664061

## 2019-07-13 NOTE — ED Notes
Verbal report given to [facility] RN for continuity of pt care at [place] has been made aware of any overdue medications or outstanding orders, and denies any further questions

## 2019-07-13 NOTE — Nursing Note
BP77/55 (63), HR 84, SpO2 94% RA. Asymptomatics. Paged Dr.Zheng.

## 2019-07-13 NOTE — Nursing Note
Pt is alert and oriented X 4, ambulates with a stand by assist, gait steady, no dizziness. 1L LR bolus given for low BP. Pt voided 550 ml without difficulty. Pt stated that she has less pain when taking liquids. She prefers liquid diet.     0646- LR 500 ml bolus given.     Head and Neck MD at the bedside examines the pt, lidocaine and oxymetazoline given for the procedure.     Per MD pt can eat breakfast after 0730.         BP 93/55  ~ Pulse 85  ~ Temp 36 C (96.8 F) (Oral)  ~ Resp 16  ~ Wt 62.1 kg (137 lb)  ~ SpO2 96%  ~ BMI 23.52 kg/m

## 2019-07-13 NOTE — Consults
SPRITUAL CARE CONSULTATION NOTE    PATIENT:  Donna Gross  MRN:  [id number]     Patient Info        Religious/Spiritual Identity:        Catholic       Last Anointed Date:                 Baptised:                 Spiritual Care Visit Details              Date of Visit:  07/13/19  Time of Visit:  1335  Visited with Patient   Visit length 30 Minutes   Referral source Self-initiated   Reason for visit Initial visit/assessment      Spiritual Assessment     Spiritual practices & resources Family/Friends, Personal faith/Spiritual beliefs, Meaningful personal beliefs and values, Prayer, Spiritual reflection/discussion(Pt enjoys spiritual and theological discussions)   Areas of spiritual/emotional distress Adjustment to illness/hospitalization, Emotional/Spiritual weariness/fatigue, Concerns for health and healing, Need for processing feelings/emotions, Grief and/or Loss(Pt shared her challenges with on-going medical issues and the sense of her loss of 'normal'.)   Distressful feelings     Indicators of spiritual wellbeing Able to receive love and support, Takes initiative in own spiritual life, Trust in [place]...   Expressions of spiritual wellbeing Expresses desire to get well, Expresses gratitude      Plan     Spiritual care intervention Active Listening, Addressed emotional concerns/distress, Building trust, Addressed spiritual concerns/distress, Blessing offered, Explored feelings about God/the Transcendent, Explored feelings related to present illness, Introduction to chaplain services, Life review, Ministry of presence, Prayer, Offered words of comfort/encouragement, Pastoral Conversation   Outcomes (per patient/family) Appreciated visit   Spiritual care plans Continue to visit as needed   Additional comments        Recommendation       Author:  Sharol Given 07/13/2019 3:05 PM  Contact info: SM pager: [phone] ext: [phone]

## 2019-07-13 NOTE — Consults
Speech Language Pathology  Modified Barium Swallow Study (MBSS)    PATIENT: Donna Gross  MRN: [id number]  DOB: 06-26-1966     Date of Evaluation: 07/13/2019  Evaluation Start Time: 11:10 a.m.    Problems: Principal Problem:    Odynophagia POA: Yes       Past Medical History:   Diagnosis Date   ??? Anxiety    ??? Depression    ??? Emphysema, unspecified (HCC/RAF)    ??? Fibromyalgia    ??? Gastritis    ??? GERD (gastroesophageal reflux disease)    ??? Pancreatitis     Past Surgical History:   Procedure Laterality Date   ??? ABDOMINAL SURGERY     ??? COLON SURGERY            Patient Status     Background Relevant to Treatment Diagnosis: 53 year old female with a history of complex peptic ulcer???disease s/p multiple surgeries,???now???s/p laparoscopic vagotomy and antrectomy, gastrojejunostomy, and hiatal hernia repair (06/21/19; POD21).???Patient was discharged 07/02/19 in good condition???after resolution of swallowing pain that patient first reported on POD8 (06/29/19).???She presents now with complaints of right-sided???neck pain, pain with swallowing, and mild nausea. An MBSS was recommended per H&N for post-operative odynophagia. H&N notes are currently not available, pending flexible laryngoscopy.   ???  Previous Level of Function: The pt reported, ''Since the surgery I have pain in my chest and my throat. I have an achy and burning sensation there too. They told me I vomited when I was intubated, so they thought the burning sensation was from the vomiting. So at home I was eating just soup and jello, on my own, no one told me to do that. This morning I ate some oatmeal and it felt stuck in my throat (pointing to the right side pharyngeal recesses) and some of it came back up from my esophagus into my mouth.''    Nutrition/Hydration Intake: Mechanical soft textures;Thin liquids    Pulmonary Status: Room air.  CT Chest 07/12/19: ''Lungs: No consolidation or suspicious pulmonary nodules. Diffuse moderate centrilobular and paraseptal emphysema. Bilateral diffuse reticular opacities with basilar predominance consistent with scarring/fibrotic changes. Bilateral cylindrical and varicoid bronchiectasis. Bibasilar atelectasis. Stable mild distal esophageal thickening query inflammation.'' (See MD report for full details).    Patient Presentation: Alert;Responsive    Pain Assessment     Patient complains of pain: No. Pt denied odynophagia during this study.   Pt reported she received pain meds, but without meds she rates her odynphagia as a 9.    Materials Administered     Materials Administered: Thin liquid;Puree;Solid  Thin Liquid: Sip;Serial sips via cup  Puree: Teaspoon amounts  Solid: Lorna Doone cookie  Solid Portion Size: 1/2 piece  The pt fed herself.     Pt. Position During Exam     Seated upright in chair.     Results     Oral Peripheral Exam     Performed with six feet physical distancing due to current COVID-19 pandemic guidelines.     Oral Preparatory Stage     Oral Preparatory Stage: Abnormal findings  Findings: Reduced bolus formation with difficulty holding the bolus resulted with some premature spillage of liquids into the vallecula that spilled down towards the pyriform sinuses.    Oral Phase     Oral Phase: Abnormal findings  Findings: During lingual initiation of the swallow, some of the puree bolus spilled over the base of tongue into the vallecula.   Oral cavity residue: Trace amounts occurred with all  consistencies.    Pharyngeal Phase     Pharyngeal Phase: Notable findings  Delayed Pharyngeal Response: No  Reduced Tongue Base Contraction: No  Epiglottis Fully Deflected: Yes  Vallecular Residue: Yes, scant amount with serial sips of thin liquids.  Pyriform Sinus Residue: Scant amount with thin liquids. (Difficult to visualize the pyriform sinus space at rest)  Reduced Laryngeal Elevation: No  Apparent Prominent Cricopharyngeus: No  Reduced UES Opening: No  Penetration During the Swallow Occurred with: Thin liquid with serial sips of thin liquids in scant amounts.  Other: There is a small protrusion arising on the anterior esophagus at the C5 level and was non-obstructive to bolus flow.    Esophageal Screen: Barium contrast was visualized in the esophagus 1 minute after the last swallow. (Image sent to PACS).     Anterior Posterior View     Anterior-Posterior View: Performed with thin liquids, chin elevated.  Findings: Unremarkable    Behavioral Adaptations     Not performed.    Impressions     Lower Airway Protection:   ??? Materials aspirated: None during this study.  ??? Materials swallowed WITHOUT aspiration: Thin liquids, puree and solid food.    Swallow Efficiency:  ??? Efficient oral pharyngeal phase of swallow with liquids and food.    Sensation:  ??? No indication of sensory issues.    Esophageal Screen:   ??? The esophagus was screened during this MBSS. A considerable amount of contrast was visualized in the esophagus 1 minute after swallowing. This test cannot rule out aspiration that may occur from material backing up from the esophagus into the pharynx and larynx that might occur during the course of a meal, because the number of swallows is limited to reduced radiation exposure. Additionally, inpatients may be positioned in a supine position after a meal, which could facilitate backflow to the pharynx, from material that has not cleared the esophagus.    Treatment Plan     Patient is a Candidate for Swallowing Treatment: No.    Recommendations     ??? From an oropharyngeal point of view only, recommend regular textures, thin liquids.  ??? However, esophageal dysfunction is indicated given that contrast remained in the esophagus one minute after the last swallow. GI work-up of esophageal function is recommended.     Discussed with Dr. Sherrilee Gilles and RN Cammy Copa.    Education     Patient Education: Yes. Topic: Evaluation results and Evaluation recommendations  Person Taught: patient  Barrier: None  Needs: Knows well Teaching Method: Verbal instruction; Audiovisual Outcome: Able to repeat information and/or demonstrate what was taught.    G-codes (if applicable)   N/A      Evaluation Completed by: Shella Maxim, SLP [phone],  07/13/2019

## 2019-07-13 NOTE — ED Notes
Per Dr. Emmit Alexanders patient is not stable for transfer, needs continuity of care. Dx: odynophagia. FC aware.  Faxed faesheet and clinicals to [place] at [phone].  Received a call from [facility] and clinicals given. She gave auth/tracking # for OBS - [id number]. FC updated.

## 2019-07-13 NOTE — Consults
PATIENT:  Donna Gross  MRN:  [id number]  DOB:  11-16-1966  DATE OF SERVICE:  07/13/2019    Reason for consultation: Odynophagia, dysphagia    HPI: Evolett Somarriba is a 53 y.o. female with PMH of complex peptic ulcer disease with multiple surgeries recently s/p laparoscopic vagotomy and antrectomy, gastrojejunostomy, and hiatal hernia repair (06/21/19). She reported new odynophagia and R neck pain after surgery about 1 week after surgery and presented to ED on 7/22 due to persistent symptoms.    Of note, patient had episode of large-volume emesis after RSI induction for her recent surgery (7/1) with subsequent atraumatic intubation without complication.    She endorses R neck pain is worse when swallowing. Also endorses sticking sensation when swallowing anything except thin liquids. Endorses mild dysphonia since surgery which has been improving. Denies weight loss. Denies otalgia. Denies new head/neck masses. Endorses chronic reflux symptoms including heartburn, tasting acid.    PMH:   Past Medical History:   Diagnosis Date   ??? Anxiety    ??? Depression    ??? Emphysema, unspecified (HCC/RAF)    ??? Fibromyalgia    ??? Gastritis    ??? GERD (gastroesophageal reflux disease)    ??? Pancreatitis      PSH:   Past Surgical History:   Procedure Laterality Date   ??? ABDOMINAL SURGERY     ??? COLON SURGERY       Family history:   Family History   Problem Relation Age of Onset   ??? Heart disease Mother    ??? Diabetes Brother    ??? Diabetes Father    ??? Anesthesia problems Neg Hx    ??? Malignant hypertension Neg Hx    ??? Hypotension Neg Hx    ??? Malignant hyperthermia Neg Hx    ??? Pseudochol deficiency Neg Hx      Current medications:   Scheduled Meds:  ??? lidocaine  1 patch Transdermal Daily   ??? lidocaine  1 patch Transdermal Q24H   ??? pantoprazole  40 mg IV Push Q24H   ??? QUEtiapine  150 mg Oral BID   ??? sucralfate  1 g Oral TID w/meals & QHS     Continuous Infusions:  PRN Meds:.acetaminophen, albuterol, aluminum-magnesium hydroxide-simethicone, HYDROmorphone, menthol, ondansetron, oxyCODONE **OR** oxyCODONE, prochlorperazine  Allergies: Hydrocodone and Ibuprofen   Social history:   Social History     Socioeconomic History   ??? Marital status: Single     Spouse name: Not on file   ??? Number of children: Not on file   ??? Years of education: Not on file   ??? Highest education level: Not on file   Occupational History   ??? Not on file   Social Needs   ??? Financial resource strain: Not on file   ??? Food insecurity:     Worry: Not on file     Inability: Not on file   ??? Transportation needs:     Medical: Not on file     Non-medical: Not on file   Tobacco Use   ??? Smoking status: Current Every Day Smoker     Packs/day: 0.25     Types: Cigarettes   ??? Smokeless tobacco: Current User   ??? Tobacco comment: 30 years    Substance and Sexual Activity   ??? Alcohol use: No   ??? Drug use: No   ??? Sexual activity: Never   Lifestyle   ??? Physical activity:     Days per week: Not on file  Minutes per session: Not on file   ??? Stress: Not on file   Relationships   ??? Social connections:     Talks on phone: Not on file     Gets together: Not on file     Attends religious service: Not on file     Active member of club or organization: Not on file     Attends meetings of clubs or organizations: Not on file     Relationship status: Not on file   Other Topics Concern   ??? Not on file   Social History Narrative   ??? Not on file       ROS: Negative except as per HPI  Objective:    Physical Examination:  Vitals:  BP (!) 89/66 Comment: RN, Abby notified, ~ Pulse 66  ~ Temp 37 ???C (98.6 ???F) (Oral)  ~ Resp 16  ~ Wt 137 lb (62.1 kg)  ~ SpO2 97%  ~ BMI 23.52 kg/m???    General: NAD  Cranial Nerves:    I: Deferred    II: Intact    III/IV/VI: EOM grossly intact    V: Deferred    VII: HB 1/6    VIII: Hearing grossly intact    IX/X: Palate raise symmetric, voice strong    XI: Deferred    XII: Tongue midline  Ears: No external abnormalities, no otorrhea  Nose: Dorsum midline Oral Cavity/Oropharynx: MMM, FOM soft, BOT soft, no oropharyngeal masses/lesions  Neck: Trachea midline, neck soft no LAD  Cardiovascular: No cyanosis/pallor  Respiratory: Breathing comfortably on RA  Neuro: AAO    Flexible fiberoptic nasopharyngolaryngoscopy: bilateral nasal cavities, oropharynx, hypopharynx, and larynx were examined and positive findings are as follows. Slight L septal deviation with bony spur, nasopharynx, oropharynx, hypopharynx within normal limits, No glottic masses/lesions, piriform sinuses/vallecula clear, Bilateral vocal cords are mobile.     CT Neck w/ con 07/12/19  IMPRESSION:  ???  No neck mass or cervical lymphadenopathy.   ???  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   ???  Partially imaged nonspecific right mastoid effusion.     MBSS 07/13/19  Impressions   ???  Lower Airway Protection:   ??? Materials aspirated: None during this study.  ??? Materials swallowed WITHOUT aspiration: Thin liquids, puree and solid food.  ???  Swallow Efficiency:  ??? Efficient oral pharyngeal phase of swallow with liquids and food.  ???  Sensation:  ??? No indication of sensory issues.  ???  Esophageal Screen:   ??? The esophagus was screened during this MBSS. A considerable amount of contrast was visualized in the esophagus 1 minute after swallowing. This test cannot rule out aspiration that may occur from material backing up from the esophagus into the pharynx and larynx that might occur during the course of a meal, because the number of swallows is limited to reduced radiation exposure. Additionally, inpatients may be positioned in a supine position after a meal, which could facilitate backflow to the pharynx, from material that has not cleared the esophagus.      Labs:  Recent Labs     07/13/19  0852 07/12/19  1118   WBC 6.88 6.51   HGB 8.4* 10.5*   HCT 28.0* 33.6*   PLT 601* 810*   INR  --  1.0       Assessment/Plan: Aileana Hodder is a 53 y.o. female with complex hx of recurrent duodenal ulcers requiring multiple surgeries -  last on 06/21/19 with episode of large volume emesis at time of induction. She reports new R sided neck/throat pain and associated odynophagia/dysphagia.     No evidence of upper aerodigestive abnormality on imaging/examination which would account for pain/odynophagia. Further work-up regarding retained esophageal contrast visualized on MBSS per primary team.     Recommend conservative management of symptoms.    The plan has been discussed with the attending physician, Dr. Beverly Milch, who is in agreement.    Author:  Colan Neptune, MD 07/13/2019 2:14 PM  [phone] (RR) [phone] [facility])

## 2019-07-13 NOTE — ED Notes
Consent/acknowledgement of patient's Non Medicare Rights was obtained on 7.22.20 1928.    The Non-Medicare Outpatient Observation Notice was signed by the patient after explanation. A copy was provided to patient after verbal understanding of notice.    ''You're a hospital outpatient receiving observation services. You are not an inpatient because you may have an unstable or uncertain condition potentially serious enough to warrant close observation, but not so serious as to warrant inpatient admission to the hospital. Observation services may include the use of a bed, monitoring by nursing and other staff, and any other services that are reasonable and necessary to safely evaluate your condition or determine the need for a possible inpatient admission to the hospital.    Being an outpatient may affect what you pay in a hospital:  * When you're a hospital outpatient, your observation stay is processed by your insurance carrier as part of your outpatient benefits, even though you may stay overnight in the hospital.  * You  may contact your insurance your insurance carrier for additional information about your outpatient coverage.    Observation services may affect coverage and payment of your care after you leave the hospital:  * While on observation status, your care is being provided on an outpatient basis which may affect your health care coverage reimbursement.  * If you have any questions about your observation services, ask the hospital staff member giving you this notice or the doctor providing your hospital care. You can ask to speak with someone from the Department of Care Coordination as well.''

## 2019-07-13 NOTE — Nursing Note
BP 79/54 (62), HR 70 after LR 500 ml bolus. Paged surgery team. Pt denies dizziness, chest pain or discomfort, no SOB. Encourage PO intakes.

## 2019-07-13 NOTE — ED Notes
Pt rates pain 9/10   States that pain medication that are ordered are not sufficient   Reports taking 20mg  Oxy at home   Requesting PRN Dilaudid     Pt given medication per [facility]  Tolerated well

## 2019-07-13 NOTE — ED Notes
Pt updated on plan of care  Surgery came to bedside to discuss pain management plan with pt  Pt given medication per [facility]  Tolerated well   Denies any further needs

## 2019-07-13 NOTE — ED Notes
Pt updated on new medication orders   Does not agree with oral Dilaudid   MD Cabri paged to discuss pain management plan with pt

## 2019-07-13 NOTE — Progress Notes
SURGERY / [place] [facility] [phone]  Patient: Donna Gross  MRN: [id number]  Date of Service: 07/13/2019  Hospital Day: 0  PCP: [facility]    HPI: 53 y.o. female with a history of complex peptic ulcer disease s/p multiple surgeries, now s/p laparoscopic vagotomy and antrectomy, gastrojejunostomy, and hiatal hernia repair (06/21/19; POD21). Patient was discharged 07/02/19 in good condition after resolution of swallowing pain that patient first reported on POD8 (06/29/19). She presents now with complaints of right-sided neck pain, pain with swallowing, and mild nausea.    Interval Events/Subjective:    Patient was hypotensive to 70s/50s, but asx. Patient s/p 1L bolus w/ good response.    Otherwise, continues to endorse R throat pain.     Temp:  [36 ???C (96.8 ???F)-37.1 ???C (98.7 ???F)] 36 ???C (96.8 ???F)  Heart Rate:  [57-91] 85  Resp:  [16-23] 16  BP: (74-150)/(54-102) 93/55  NBP Mean:  [62-112] 68  SpO2:  [95 %-100 %] 96 %       I/O last 3 completed shifts:  In: 2340 [P.O.:840; IV Piggyback:1500]  Out: 1000 [Urine:1000]    Diets/Supplements/Feeds   Diet    Diet mechanical soft     Start Date/Time: 07/12/19 2040      Number of Occurrences: Until Specified       Scheduled Meds:  ??? lactated ringers       ??? lidocaine  1 patch Transdermal Daily   ??? lidocaine  1 patch Transdermal Q24H   ??? pantoprazole  40 mg IV Push Q24H   ??? QUEtiapine  150 mg Oral BID   ??? sodium chloride       ??? sucralfate  1 g Oral TID w/meals & QHS       PRN Meds:  acetaminophen, albuterol, aluminum-magnesium hydroxide-simethicone, HYDROmorphone, menthol, ondansetron, oxyCODONE **OR** oxyCODONE, prochlorperazine    Exam:  Current BP 93/55  ~ Pulse 85  ~ Temp 36 ???C (96.8 ???F) (Oral)  ~ Resp 16  ~ Wt 137 lb (62.1 kg)  ~ SpO2 96%  ~ BMI 23.52 kg/m???   Gen: NAD  HEENT: NCAT, EOMI, MMM, neck supple w/ mild tender to palpation over R lateral neck  CV: RRR on monitor  Pulm: nonlabored breathing  GU: Voiding Ext: warm, well perfused, no cyanosis, clubbing, or edema  Neuro: A&Ox3  Drains: N/A  Output by Drain (mL) 07/11/19 0701 - 07/11/19 1900 07/11/19 1901 - 07/12/19 0700 07/12/19 0701 - 07/12/19 1900 07/12/19 1901 - 07/13/19 0700 07/13/19 0701 - 07/13/19 0815   Patient has no LDAs of requested type attached.        Labs:  Recent Labs     07/12/19  1118   HGB 10.5*   HCT 33.6*   PLT 810*     Recent Labs     07/12/19  1118   PT 12.8   INR 1.0       Recent Labs     07/12/19  1118   CREAT 0.93   BUN 11   NA 136   K 5.0   CL 102   CO2 18*   GLUCOSE 80   CALCIUM 9.8     No results for input(s): ICALCOR, MG, PHOS in the last 72 hours.  Recent Labs     07/12/19  1118   GLUCOSE 80       Recent Labs     07/12/19  1118   TOTPRO 8.4*   ALBUMIN 4.2  No results for input(s): PREALBUMIN in the last 72 hours.   Recent Labs     07/12/19  1118   BILITOT <0.2   BILICON <0.2   AST 31   ALT 14   ALKPHOS 107     Recent Labs     07/12/19  1118   INR 1.0     Recent Labs     07/12/19  1118   AMYLASE 184*   LIPASE 114*       Micro:   Recent Labs     07/12/19  1118   WBC 6.51       Recent Results (from the past 336 hour(s))   COVID-19 PCR, Nasopharyngeal    Collection Time: 07/12/19  8:03 PM   Result Value Ref Range    Specimen Type Respiratory, Upper     COVID-19 PCR Not Detected Not Detected     Imaging:  Xr Abdomen Ap+erect+supine+pa Chest (3 Views)    Result Date: 07/12/2019  IMPRESSION: Postsurgical changes in the upper abdomen. Normal abdominal bowel gas pattern. No intraperitoneal free air. No acute cardiopulmonary disease. Signed by: Albin Fischer   07/12/2019 12:05 PM    Ct Neck W Contrast    Result Date: 07/12/2019  IMPRESSION: No neck mass or cervical lymphadenopathy. 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 Partially imaged nonspecific right mastoid effusion. I, Kerry Fort, M.D., have personally reviewed this radiological study and agree with the report above. Dictated by: Jackelyn Hoehn   07/12/2019 5:18 PM Signed by: Kerry Fort   07/12/2019 5:38 PM    Ct Chest W Contrast    Result Date: 07/12/2019  IMPRESSION: 1. No acute findings within the chest. There is an aberrant right subclavian artery which does not appear to have mass effect on the esophagus. 2. Diffuse centrilobular and paraseptal emphysema with bibasilar predominant reticular opacities, consistent with scarring/fibrotic changes. 3. Status post antrectomy, gastrojejunostomy, and hiatal hernia repair. Stable mild distal esophageal thickening query inflammation. Interval removal of left upper quadrant drainage catheter. 4. Redemonstration of distended gallbladder with biliary dilatation, grossly stable. 5. Triple-vessel severe atherosclerotic coronary artery disease. 6. Interval resolution of bilateral pleural effusions. I, Lane Hacker, M.D., have reviewed this radiological study personally and I am in full agreement with the findings of the report presented here. Dictated by: Donnelly Angelica   07/12/2019 5:16 PM Signed by: Lane Hacker   07/12/2019 5:39 PM    Ct Abd+pelvis W Contrast    Result Date: 06/29/2019  IMPRESSION: Postsurgical changes. No drainable fluid collections demonstrated. No bowel obstruction. There is a small globular hyperdensity noted adjacent to the gastroesophageal junction, indeterminate origin but probably postsurgical material. Signed by: Billee Cashing   06/29/2019 9:26 AM    Xr Chest Ap Portable (1 View)    Result Date: 06/29/2019  IMPRESSION: Normal cardiac silhouette. Improved aeration within the lung parenchyma with improved bibasilar opacities, likely atelectasis. Small left greater than right pleural effusions. Abdominal surgical drain. No significant osseous abnormalities. Signed by: Huel Coventry   06/29/2019 8:18 PM    Xr Chest Ap Portable (1 View)    Result Date: 06/24/2019  IMPRESSION:  Epidural catheter is present. NG/OG is removed. Upper abdominal surgical drains. Allowing for low lung volume the cardiomediastinal silhouette is stable. Mild interstitial pulmonary edema and vascular congestion. Interval increase in small left more than right bilateral pleural effusions with associated atelectasis/consolidation. Decrease in left lateral chest wall subcutaneous emphysema. Signed by:  Burtis Junes Bedayat   06/24/2019 11:42 AM    Xr Kub Portable (1 View)    Result Date: 06/20/2019  IMPRESSION: Moderate stool burden. Normal bowel gas pattern. No intraperitoneal free air. No acute osseous abnormality. I, Quentin Cornwall, M.D., have reviewed this radiological study personally and I am in full agreement with the findings of the report presented here. Dictated by: Carolina Cellar   06/20/2019 4:12 PM Signed by: Quentin Cornwall   06/20/2019 4:31 PM    Xr Chest For Feeding Tube Placement (1 View)    Result Date: 06/22/2019  IMPRESSION: Epidural catheter is present. An enteric tube coursing below diaphragm with the tip projecting over mid gastric body. Upper abdominal surgical drains. Allowing for low lung volume the cardiomediastinal silhouette is stable. Mild interstitial pulmonary edema and vascular congestion. Left chest and abdominal wall moderate subcutaneous emphysema. Signed by: Benjamine Sprague   06/22/2019 9:12 AM      Assessment/Plan:  Donna Gross is a 53 y.o. female with a history of complex peptic ulcer disease s/p multiple surgeries, now s/p laparoscopic vagotomy and antrectomy, gastrojejunostomy, and hiatal hernia repair (06/21/19). Patient was discharged home 7/12 in stable condition. She now presents with several weeks of right-sided neck pain, pain with swallowing, and mild nausea.  She is presently  continue endorse R sided neck pain; thus here for w/u and mgmt of her odynophagia.  - Mechanical soft diet, continue home medications including PPI and symptomatic relief for odynophagia  - Flexible laryngoscopy by [facility] Neck in the AM  - MBSS by SLP ordered - Pain Management:    - Chronic pain consulted given continued post-operative opioid use, will appreciate recs.   - Tylenol 325mg  q6H PRN   - Dilaudid 2mg  q6H PRN   - Oxy 15/20 q6H PRN   - Maalox q6H PRN   - Menthol lozenge 5mg  q4H PRN   - Lidocaine patch  - Nausea mgmt:   - Zofran 4mg  q8H PRN   - Prochlorperazine 5mg  q4H PRN    #PPx   - SCDs    #Disposition  - D/C home when pain is controlled on PO pain regimen and pending work-up of odynophagia  - No home health needs anticipated.  - Please page [phone] (surgery, [place] team) with any questions.  - Code Status: Full Code    Patient seen with General Surgery Team and plan of care discussed with Attending, Alonna Buckler., MD, who is in agreement.     Author:    Carmin Richmond, MD  8:15 AM 07/13/2019  Division of General Surgery  [facility]

## 2019-07-13 NOTE — ED Notes
Pt departed ED in NAD   All belongings to unit with pt   Pt transported by tech, non monitored   VSS prior to depature

## 2019-07-14 ENCOUNTER — Ambulatory Visit: Payer: PRIVATE HEALTH INSURANCE

## 2019-07-14 MED ORDER — OXYCODONE HCL 5 MG/5ML PO SOLN
0 refills | Status: AC
Start: 2019-07-14 — End: 2019-07-15
  Filled 2019-07-15: qty 60, 5d supply, fill #0

## 2019-07-14 MED ORDER — OXYCODONE HCL 5 MG PO TABS
ORAL_TABLET | 0 refills | 5.00000 days | Status: AC
Start: 2019-07-14 — End: ?

## 2019-07-14 MED ORDER — OXYCODONE HCL 5 MG PO TABS
ORAL_TABLET | 0 refills | 10.00 days | Status: AC
Start: 2019-07-14 — End: 2019-07-15

## 2019-07-14 MED ORDER — HYDROMORPHONE HCL 2 MG PO TABS
2 mg | ORAL_TABLET | Freq: Four times a day (QID) | ORAL | 0 refills | 5.00 days | Status: AC | PRN
Start: 2019-07-14 — End: ?
  Filled 2019-07-15: qty 20, 5d supply, fill #0

## 2019-07-14 MED ADMIN — LIDOCAINE 5 % EX PTCH: 1 | TRANSDERMAL | @ 04:00:00 | Stop: 2019-07-15

## 2019-07-14 MED ADMIN — HYDROMORPHONE HCL 2 MG PO TABS: 2 mg | ORAL | @ 19:00:00 | Stop: 2019-07-15 | NDC 42858030125

## 2019-07-14 MED ADMIN — HYDROMORPHONE HCL 2 MG PO TABS: 2 mg | ORAL | @ 13:00:00 | Stop: 2019-07-15 | NDC 42858030125

## 2019-07-14 MED ADMIN — DIATRIZOATE MEGLUMINE & SODIUM 66-10 % PO SOLN: 120 mL | ORAL | @ 16:00:00 | Stop: 2019-07-14 | NDC 00270044540

## 2019-07-14 MED ADMIN — OXYCODONE HCL 5 MG/5ML PO SOLN: 20 mg | ORAL | @ 10:00:00 | Stop: 2019-07-15 | NDC 66689040150

## 2019-07-14 MED ADMIN — OXYCODONE HCL 5 MG/5ML PO SOLN: 20 mg | ORAL | @ 04:00:00 | Stop: 2019-07-15 | NDC 66689040150

## 2019-07-14 MED ADMIN — HYDROMORPHONE HCL 2 MG PO TABS: 2 mg | ORAL | @ 07:00:00 | Stop: 2019-07-15 | NDC 42858030125

## 2019-07-14 MED ADMIN — HYDROMORPHONE HCL 2 MG PO TABS: 2 mg | ORAL | @ 01:00:00 | Stop: 2019-07-15 | NDC 42858030125

## 2019-07-14 MED ADMIN — SUCRALFATE 1 GM/10ML PO SUSP: 1 g | ORAL | @ 04:00:00 | Stop: 2019-07-15 | NDC 68094004361

## 2019-07-14 MED ADMIN — ZZ IMS TEMPLATE: 150 mg | ORAL | @ 16:00:00 | Stop: 2019-07-15 | NDC 00904664061

## 2019-07-14 MED ADMIN — SUCRALFATE 1 GM/10ML PO SUSP: 1 g | ORAL | @ 16:00:00 | Stop: 2019-07-15 | NDC 68094004361

## 2019-07-14 MED ADMIN — PANTOPRAZOLE SODIUM 40 MG IV SOLR: 40 mg | INTRAVENOUS | @ 04:00:00 | Stop: 2019-07-15

## 2019-07-14 MED ADMIN — SUCRALFATE 1 GM/10ML PO SUSP: 1 g | ORAL | @ 02:00:00 | Stop: 2019-07-15

## 2019-07-14 MED ADMIN — DEXTROSE-NACL 5-0.9 % IV SOLN: 75 mL/h | INTRAVENOUS | @ 07:00:00 | Stop: 2019-07-14 | NDC 00338008904

## 2019-07-14 MED ADMIN — OXYCODONE HCL 5 MG/5ML PO SOLN: 20 mg | ORAL | @ 16:00:00 | Stop: 2019-07-15 | NDC 66689040150

## 2019-07-14 MED ADMIN — SUCRALFATE 1 GM/10ML PO SUSP: 1 g | ORAL | @ 20:00:00 | Stop: 2019-07-15 | NDC 68094004361

## 2019-07-14 MED ADMIN — LIDOCAINE 5 % EX PTCH: 1 | TRANSDERMAL | @ 16:00:00 | Stop: 2019-07-15

## 2019-07-14 MED ADMIN — ZZ IMS TEMPLATE: 150 mg | ORAL | @ 04:00:00 | Stop: 2019-07-15 | NDC 00904664061

## 2019-07-14 NOTE — Consults
Contacted pt to discuss CSW consult stating pt unable to pay for pain meds.  Pt reported this isn't a problem as her [employer] insurance covers them.  She stated the issue is that she only received ''half'' her Oxy prescription at [facility] following a recent admission there and wants to make sure she receives pain medication upon dc from this current [facility] hospitalization .  Pt also requesting ''drops for (my) throat'' which were apparently also promised to her at some point.  Provided the above info to surgical NP Melissa who will follow up.  Pt's daughter helps her pick up her prescriptions normally when she is at home.  Encouragement and supportive counseling offered to pt.  Will follow as needed

## 2019-07-14 NOTE — Progress Notes
NUTRITION IN-DEPTH SCREEN (Adult)    Admit Date:      Date of Birth: 07/06/1966 Gender: female MRN: [id number]     Date of Screening 07/14/2019   Subjective: Pt resting upon visit and reports a good appetite, tolerating po's, denies N/V/diarrhea. States she likes to eat her meals slowly throughout the day. Reports she tolerated oatmeal, muffin, coffee and a grilled cheese sandwich for breakfast this am. States she had her procedure today waiting to hear from surgery. Requested assistance with lunch order selecting [place] on white, with L&T, Ensure chocolate ONS, apple and grape juice, and beef broth.    Problems: Principal Problem:    Odynophagia POA: Yes       Past Medical History:   Diagnosis Date   ??? Anxiety    ??? Depression    ??? Emphysema, unspecified (HCC/RAF)    ??? Fibromyalgia    ??? Gastritis    ??? GERD (gastroesophageal reflux disease)    ??? Pancreatitis     Past Surgical History:   Procedure Laterality Date   ??? ABDOMINAL SURGERY     ??? COLON SURGERY           Anthropometrics     Height: 162.6 cm (5' 4'')(Obtained from chart)  Admit Weight: 62.1 kg (137 lb) (07/12/19 1439) Last 5 recorded weights:  Weights 06/02/2019 06/23/2019 06/27/2019 06/30/2019 07/12/2019   Weight 64 kg (No Data) 67.4 kg 62.4 kg 62.1 kg            IBW: 54.4 kg (120 lb)  % Ideal Body Weight: 114 %  BMI (Calculated): 23.6    Usual Weight: (fluctuates per pt)        Wt Readings from Last 10 Encounters:   07/12/19 62.1 kg (137 lb)   06/30/19 62.4 kg (137 lb 9.6 oz)   06/02/19 64 kg (141 lb 1.6 oz)   03/09/19 59.4 kg (131 lb)   03/09/19 59.4 kg (131 lb)   12/26/15 49.9 kg (110 lb)        Allergies   Hydrocodone and Ibuprofen     Cultural / Religious / Ethnic Food Preferences   None       Nutrition Prior to Admission   Pt follows regular diet at home, likes to eat slowly     Nutrition Risk Factors   Moderate Nutrition Risk Factors: < 50% usual oral intake x 4 days  Acuity Level: 2-Moderate risk        Diet Orders     Diets/Supplements/Feeds   Diet Diet regular     Start Date/Time: 07/14/19 1030      Number of Occurrences: Until Specified        Impression   PO % consumed: 76 to 100%, 51-75%  Impression: Variable intake, Diet tolerated well with fair intake, Pt/family requesting snacks and supplements  Fair po's recorded, tolerating po's, denies N/V/diarrhea, MBSS complete with recommendations for regular textures, thin liquids, and GI workup of esophageal workup noted.   Diet Education   No diet education needs at this time      FDI Target Drugs: No          Nutrition Care Plan   Plan: Continue with diet as ordered, Trend weights, Monitor tolerance to diet     Monitor po's   Will follow per policy.       Next Follow-up by 07/19/19    Author:  Orlin Hilding, DTR, pager [phone]  07/14/2019 1:00 PM

## 2019-07-14 NOTE — Other
Patients Clinical Goal:   Clinical Goal(s) for the Shift:  pain mgmt, vss, npo after MN, ivf, comfort/sag/fall/sleep hygiene measures  Identify possible barriers to advancing the care plan:   Stability of the patient: Moderately Stable - low risk of patient condition declining or worsening   End of Shift Summary: pt a/o x4, vss, pain managed with po oxycodone & dilaudid alternating, pt is npo after mn with IVF infusing at 41ml/hr,  Up to bathroom with standby assist, call light within reach, comfort/saftey/fall/sleep hygiene measures provided, will cont' to monitor until ensdorsin gpoc toonocming RN at bedside report, pending this am procedure BP 120/74  ~ Pulse 77  ~ Temp 36.6 C (97.8 F) (Oral)  ~ Resp 20  ~ Wt 62.1 kg (137 lb)  ~ SpO2 98%  ~ BMI 23.52 kg/m

## 2019-07-14 NOTE — Other
Patients Clinical Goal:   Clinical Goal(s) for the Shift: vss, safety, barium swallow, pain management  Identify possible barriers to advancing the care plan:   Stability of the patient: Moderately Stable - low risk of patient condition declining or worsening   End of Shift Summary:      AAOx4, calm, cooperative   Modified barium swallow test done today, GI consult done and pending [place] upper gi tomorrow am. Will be NPO at midnight, pt educated   Pain managed with oxy and dilaudid PO ATC, same as her home regimen   Ambulated freely to restroom with minimal standby assist   All questions addressed at this time, all care will be endorsed to night RN   BP 107/76  ~ Pulse 78  ~ Temp 36.8 C (98.2 F) (Oral)  ~ Resp 16  ~ Wt 62.1 kg (137 lb)  ~ SpO2 96%  ~ BMI 23.52 kg/m

## 2019-07-14 NOTE — Progress Notes
SURGERY / [place] [facility] [phone]  Patient: Donna Gross  MRN: [id number]  Date of Service: 07/14/2019  Hospital Day: 0  PCP: [facility]    HPI: 53 y.o. female with a history of complex peptic ulcer disease s/p multiple surgeries, now s/p laparoscopic vagotomy and antrectomy, gastrojejunostomy, and hiatal hernia repair (06/21/19; POD21). Patient was discharged 07/02/19 in good condition after resolution of swallowing pain that patient first reported on POD8 (06/29/19). She presents now with complaints of right-sided neck pain, pain with swallowing, and mild nausea.     Interval Events/Subjective:    7/23: Patient was hypotensive to 70s/50s, but asx. Patient s/p 1L bolus w/ good response.  7/24: No BP issues overnight. AFVSS. Will get upper GI series today, probable discharge after that pending chronic pain reccs.    Temp:  [36.4 ???C (97.6 ???F)-37 ???C (98.6 ???F)] 36.6 ???C (97.8 ???F)  Heart Rate:  [66-80] 77  Resp:  [16-20] 20  BP: (85-120)/(63-78) 120/74  NBP Mean:  [89] 89  SpO2:  [94 %-98 %] 98 %       I/O last 3 completed shifts:  In: 2060 [P.O.:1560; IV Piggyback:500]  Out: 1700 [Urine:1700]    Diets/Supplements/Feeds   Diet    Diet NPO     Start Date/Time: 07/14/19 0000      Number of Occurrences: 1 Days       Scheduled Meds:  ??? lidocaine  1 patch Transdermal Daily   ??? lidocaine  1 patch Transdermal Q24H   ??? pantoprazole  40 mg IV Push Q24H   ??? QUEtiapine  150 mg Oral BID   ??? sucralfate  1 g Oral TID w/meals & QHS       PRN Meds:  acetaminophen, albuterol, aluminum-magnesium hydroxide-simethicone, HYDROmorphone, menthol, ondansetron, oxyCODONE **OR** oxyCODONE, prochlorperazine    Exam:  Current BP 120/74  ~ Pulse 77  ~ Temp 36.6 ???C (97.8 ???F) (Oral)  ~ Resp 20  ~ Wt 62.1 kg (137 lb)  ~ SpO2 98%  ~ BMI 23.52 kg/m???   Gen: NAD  HEENT: NCAT, EOMI, MMM, neck supple w/ mild tender to palpation over R lateral neck  CV: RRR on monitor  Pulm: nonlabored breathing  GU: Voiding Ext: warm, well perfused, no cyanosis, clubbing, or edema  Neuro: A&Ox3  Drains: N/A  Output by Drain (mL) 07/12/19 0701 - 07/12/19 1900 07/12/19 1901 - 07/13/19 0700 07/13/19 0701 - 07/13/19 1900 07/13/19 1901 - 07/14/19 0700 07/14/19 0701 - 07/14/19 0725   Patient has no LDAs of requested type attached.        Labs:  Recent Labs     07/13/19  0852 07/12/19  1118   HGB 8.4* 10.5*   HCT 28.0* 33.6*   PLT 601* 810*     Recent Labs     07/12/19  1118   PT 12.8   INR 1.0       Recent Labs     07/13/19  0852 07/12/19  1118   CREAT 0.97 0.93   BUN 10 11   NA 142 136   K 4.2 5.0   CL 108* 102   CO2 19* 18*   GLUCOSE 96 80   CALCIUM 8.8 9.8     No results for input(s): ICALCOR, MG, PHOS in the last 72 hours.  Recent Labs     07/13/19  0852 07/12/19  1118   GLUCOSE 96 80       Recent Labs     07/12/19  1118   TOTPRO 8.4*   ALBUMIN 4.2     No results for input(s): PREALBUMIN in the last 72 hours.   Recent Labs     07/12/19  1118   BILITOT <0.2   BILICON <0.2   AST 31   ALT 14   ALKPHOS 107     Recent Labs     07/12/19  1118   INR 1.0     Recent Labs     07/12/19  1118   AMYLASE 184*   LIPASE 114*       Micro:   Recent Labs     07/13/19  0852 07/12/19  1118   WBC 6.88 6.51       Recent Results (from the past 336 hour(s))   COVID-19 PCR, Nasopharyngeal    Collection Time: 07/12/19  8:03 PM   Result Value Ref Range    Specimen Type Respiratory, Upper     COVID-19 PCR Not Detected Not Detected     Imaging:  Xr Abdomen Ap+erect+supine+pa Chest (3 Views)    Result Date: 07/12/2019  IMPRESSION: Postsurgical changes in the upper abdomen. Normal abdominal bowel gas pattern. No intraperitoneal free air. No acute cardiopulmonary disease. Signed by: Albin Fischer   07/12/2019 12:05 PM    Ct Neck W Contrast    Result Date: 07/12/2019  IMPRESSION: No neck mass or cervical lymphadenopathy. 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 Partially imaged nonspecific right mastoid effusion. I, Kerry Fort, M.D., have personally reviewed this radiological study and agree with the report above. Dictated by: Jackelyn Hoehn   07/12/2019 5:18 PM Signed by: Kerry Fort   07/12/2019 5:38 PM    Ct Chest W Contrast    Result Date: 07/12/2019  IMPRESSION: 1. No acute findings within the chest. There is an aberrant right subclavian artery which does not appear to have mass effect on the esophagus. 2. Diffuse centrilobular and paraseptal emphysema with bibasilar predominant reticular opacities, consistent with scarring/fibrotic changes. 3. Status post antrectomy, gastrojejunostomy, and hiatal hernia repair. Stable mild distal esophageal thickening query inflammation. Interval removal of left upper quadrant drainage catheter. 4. Redemonstration of distended gallbladder with biliary dilatation, grossly stable. 5. Triple-vessel severe atherosclerotic coronary artery disease. 6. Interval resolution of bilateral pleural effusions. I, Lane Hacker, M.D., have reviewed this radiological study personally and I am in full agreement with the findings of the report presented here. Dictated by: Donnelly Angelica   07/12/2019 5:16 PM Signed by: Lane Hacker   07/12/2019 5:39 PM    Ct Abd+pelvis W Contrast    Result Date: 06/29/2019  IMPRESSION: Postsurgical changes. No drainable fluid collections demonstrated. No bowel obstruction. There is a small globular hyperdensity noted adjacent to the gastroesophageal junction, indeterminate origin but probably postsurgical material. Signed by: Billee Cashing   06/29/2019 9:26 AM    Xr Chest Ap Portable (1 View)    Result Date: 06/29/2019  IMPRESSION: Normal cardiac silhouette. Improved aeration within the lung parenchyma with improved bibasilar opacities, likely atelectasis. Small left greater than right pleural effusions. Abdominal surgical drain. No significant osseous abnormalities. Signed by: Huel Coventry   06/29/2019 8:18 PM    Xr Chest Ap Portable (1 View)    Result Date: 06/24/2019  IMPRESSION:  Epidural catheter is present. NG/OG is removed. Upper abdominal surgical drains. Allowing for low lung volume the cardiomediastinal silhouette is stable. Mild interstitial pulmonary edema and vascular congestion. Interval increase in small left more than  right bilateral pleural effusions with associated atelectasis/consolidation. Decrease in left lateral chest wall subcutaneous emphysema. Signed by: Floy Sabina   06/24/2019 11:42 AM    Xr Kub Portable (1 View)    Result Date: 06/20/2019  IMPRESSION: Moderate stool burden. Normal bowel gas pattern. No intraperitoneal free air. No acute osseous abnormality. I, Quentin Cornwall, M.D., have reviewed this radiological study personally and I am in full agreement with the findings of the report presented here. Dictated by: Carolina Cellar   06/20/2019 4:12 PM Signed by: Quentin Cornwall   06/20/2019 4:31 PM    Xr Chest For Feeding Tube Placement (1 View)    Result Date: 06/22/2019  IMPRESSION: Epidural catheter is present. An enteric tube coursing below diaphragm with the tip projecting over mid gastric body. Upper abdominal surgical drains. Allowing for low lung volume the cardiomediastinal silhouette is stable. Mild interstitial pulmonary edema and vascular congestion. Left chest and abdominal wall moderate subcutaneous emphysema. Signed by: Benjamine Sprague   06/22/2019 9:12 AM      Assessment/Plan:  Federica Allport is a 53 y.o. female with a history of complex peptic ulcer disease s/p multiple surgeries, now s/p laparoscopic vagotomy and antrectomy, gastrojejunostomy, and hiatal hernia repair (06/21/19). Patient was discharged home 7/12 in stable condition. She now presents with several weeks of right-sided neck pain, pain with swallowing, and mild nausea.  She is presently  continue endorse R sided neck pain; thus here for w/u and mgmt of her odynophagia.  - Mechanical soft diet, continue home medications including PPI and symptomatic relief for odynophagia  - Flexible laryngoscopy was negative  - MBSS by SLP showed retained barium contents, ordered upper GI study for toay  - Pain Management:    - Chronic pain consulted given continued post-operative opioid use, will appreciate recs.   - Tylenol 325mg  q6H PRN   - Dilaudid 2mg  q6H PRN   - Oxy 15/20 q6H PRN   - Maalox q6H PRN   - Menthol lozenge 5mg  q4H PRN   - Lidocaine patch  - Nausea mgmt:   - Zofran 4mg  q8H PRN   - Prochlorperazine 5mg  q4H PRN    #PPx   - SCDs    #Disposition  - D/C home when pain is controlled on PO pain regimen and pending work-up of odynophagia  - No home health needs anticipated.  - Please page [phone] (surgery, [place] team) with any questions.  - Code Status: Full Code    Patient seen with General Surgery Team and plan of care discussed with Attending, Alonna Buckler., MD, who is in agreement.     Author:    Veryl Speak. Rica Records, MD  7:25 AM 07/14/2019  Division of General Surgery  [facility]

## 2019-07-14 NOTE — Consults
PATIENT:  Donna Gross  MRN:  [id number]  DOB:  1966/01/25  DATE OF SERVICE:  07/14/2019  .  ATTENDING PHYSICIAN: No att. providers found   PRIMARY CARE PROVIDER: [facility], [facility]    Chief complaint:   Chief Complaint   Patient presents with   ??? Emesis     Pt was seen here earlier and discharged. Pt was sent back to ED by surgery due to N/V and unable to keep anything down. Please page surgery [phone]. Pt reports LUQ pain. Pt had ulcer & hernia repair on 7/1        BACKGROUND PAIN HISTORY:    Donna Gross is a 53 y.o. female with a PMH of PUD s/p multiple surgeries, now s/p lap vagotomy and antrectomy, gastrojejunostomy, and hiatal hernia repair on 06/21/2019, admitted with odynophagia.    We are consulted by the primary team (attending: Dr. No att. providers found) for an opinion regarding the management of this patient's acute on chronic pain.    CURRENT PAIN PRESENTATION (last 24 hours):    The pain is in her right neck and throat. It is worse with swallowing. A/w mild nausea. Tolerating a mechanical soft diet, but difficult to feed due to pain. Continues to have pain despite neg upper GI series and no acute findings on CT chest/neck and unable to wean opioid medications.    CURRENT PAIN REGIMEN (inpatient):    Lidocaine 5% patch  Tylenol 325 mg Q 6 hours PRN  Dilaudid 2 mg Q6 hours PRN  Oxycodone soln 15/20 mg Q6 hours PRN     Home Regimen:    None    Past Treatment History:    Opioids:    NSAIDs:    Muscle relaxants:    Neuropathic pain medications:    Other:    CURES report (last checked on 07/14/2019):    _________________________________    Past Medical History:   Diagnosis Date   ??? Anxiety    ??? Depression    ??? Emphysema, unspecified (HCC/RAF)    ??? Fibromyalgia    ??? Gastritis    ??? GERD (gastroesophageal reflux disease)    ??? Pancreatitis         Past Surgical History:   Procedure Laterality Date   ??? ABDOMINAL SURGERY     ??? COLON SURGERY            Family History   Problem Relation Age of Onset ??? Heart disease Mother    ??? Diabetes Brother    ??? Diabetes Father    ??? Anesthesia problems Neg Hx    ??? Malignant hypertension Neg Hx    ??? Hypotension Neg Hx    ??? Malignant hyperthermia Neg Hx    ??? Pseudochol deficiency Neg Hx          Social History     Socioeconomic History   ??? Marital status: Single     Spouse name: Not on file   ??? Number of children: Not on file   ??? Years of education: Not on file   ??? Highest education level: Not on file   Occupational History   ??? Not on file   Social Needs   ??? Financial resource strain: Not on file   ??? Food insecurity:     Worry: Not on file     Inability: Not on file   ??? Transportation needs:     Medical: Not on file     Non-medical: Not on file  Tobacco Use   ??? Smoking status: Current Every Day Smoker     Packs/day: 0.25     Types: Cigarettes   ??? Smokeless tobacco: Current User   ??? Tobacco comment: 30 years    Substance and Sexual Activity   ??? Alcohol use: No   ??? Drug use: No   ??? Sexual activity: Never   Lifestyle   ??? Physical activity:     Days per week: Not on file     Minutes per session: Not on file   ??? Stress: Not on file   Relationships   ??? Social connections:     Talks on phone: Not on file     Gets together: Not on file     Attends religious service: Not on file     Active member of club or organization: Not on file     Attends meetings of clubs or organizations: Not on file     Relationship status: Not on file   Other Topics Concern   ??? Not on file   Social History Narrative   ??? Not on file          Current Facility-Administered Medications   Medication Dose Route Frequency   ??? acetaminophen 32 mg/ml liquid 325 mg  325 mg Oral Q6H PRN   ??? albuterol 90 mcg/act inh 2 puff  2 puff Inhalation Q8H PRN   ??? aluminum-magnesium hydroxide-simethicone 400-400-40 mg/5 mL susp 10 mL  10 mL Oral Q6H PRN   ??? HYDROmorphone tab 2 mg  2 mg Oral Q6H PRN   ??? lidocaine 5% patch 1 patch  1 patch Transdermal Daily   ??? lidocaine 5% patch 1 patch  1 patch Transdermal Q24H ??? menthol lozenge 5 mg  1 lozenge Mouth/Throat Q4H PRN   ??? ondansetron 4 mg/2 mL inj 4 mg  4 mg IV Push Q8H PRN   ??? oxyCODONE 5 mg/5 mL soln 15 mg  15 mg Oral Q6H PRN    Or   ??? oxyCODONE 5 mg/5 mL soln 20 mg  20 mg Oral Q6H PRN   ??? pantoprazole inj 40 mg  40 mg IV Push Q24H   ??? prochlorperazine 10 mg/2 mL inj 5 mg  5 mg IV Push Q4H PRN   ??? QUEtiapine tab 150 mg  150 mg Oral BID   ??? sucralfate 1 g/10 mL susp 1 g  1 g Oral TID w/meals & QHS     Current Outpatient Medications   Medication Sig   ??? albuterol (2.5 mg/28mL) 0.083% nebulizer solution Take 2.5 mg by nebulization every eight (8) hours as needed (SOB) .   ??? albuterol 90 mcg/act inhaler Inhale 2 puffs every eight (8) hours as needed (SOB).   ??? aluminum-magnesium hydroxide-simethicone (ALMACONE) 200-200-20 mg/5 mL suspension Take 5 mLs by mouth three (3) times daily.   ??? aluminum-magnesium hydroxide-simethicone 400-400-40 mg/5 mL suspension Take 10 mLs by mouth every four (4) hours as needed.   ??? clobetasol 0.05% cream Apply topically three (3) times daily as needed (rash).   ??? cyclobenzaprine 10 mg tablet Take 10 mg by mouth two (2) times daily as needed for Muscle spasms.   ??? dicyclomine 20 mg tablet Take 0.5 tablets (10 mg total) by mouth two (2) times daily.   ??? fluticasone-salmeterol (WIXELA INHUB) 250-50 mcg/dose diskus Inhale 1 puff two (2) times daily as needed (SOB).   ??? HYDROmorphone 2 mg tablet Take 1 tablet (2 mg total) by mouth every six (6)  hours as needed for Severe Pain (Pain Scale 7-10) (please try oxycodone first (wait at least 1 hour after oxycodone administration), only if not yet due for next oxycodone. 2nd line). Max Daily Amount: 8 mg   ??? ipratropium-albuterol 20-100 mcg/act inhaler Inhale 2 puffs three (3) times daily as needed (SOB).   ??? lidocaine 5% patch Place 1 patch onto the skin daily 12 hours on 12 hours off.Marland Kitchen   ??? lidocaine viscous 2% oral solution Swish and spit 5 mLs three (3) times daily. ??? menthol 5 mg lozenge Use as directed 1 lozenge (5 mg total) in the mouth or throat every four (4) hours as needed for Cough or Sore Throat.   ??? Naloxone HCl 4 MG/0.1ML LIQD Call 911. Administer a single spray intranasally into one nostril for opioid overdose. May repeat in 3 minutes if patient is not breathing.Marland Kitchen   ??? ondansetron 4 mg tablet Take 1 tablet (4 mg total) by mouth every six (6) hours as needed for Nausea.   ??? oxyCODONE 5 mg tablet Take 1-3 tabs (5-15 mg total) by mouth every 6-8 hours as needed for pain.Marland Kitchen   ??? PANTOPRAZOLE SODIUM PO Take 40 mg by mouth daily.   ??? promethazine 6.25 mg/5 mL solution Take 6.25 mg by mouth every six (6) hours as needed (cough).   ??? quetiapine 100 mg tablet Take 1 tablet (100 mg total) by mouth two (2) times daily.   ??? sucralfate 1 g/10 mL suspension Take 10 mLs (1 g total) by mouth three (3) times daily with meals and at bedtime.       Allergies    Hydrocodone and Ibuprofen      Review of Systems:    General ROS: negative for chills, fatigue or fever  Psychological ROS: negative for anxiety, depression and insomnia  Ophthalmic ROS: negative for blurry vision, decreased vision or double vision  ENT ROS: negative for vertigo, visual changes or vocal changes  Allergy and Immunology ROS: negative for nasal congestion, nasal discharge or postnasal drip  Hematological and Lymphatic ROS: negative for bleeding problems, blood clots or bruising  Endocrine ROS: negative for mood swings, palpitations or polydipsia/polyuria  Breast ROS: negative for lumps, discharge or erythema  Respiratory ROS: no cough, shortness of breath, or wheezing  Cardiovascular ROS: no chest pain, palpitations or dyspnea on exertion  Gastrointestinal ROS: no abdominal pain, change in bowel habits, or black or bloody stools  Genito-Urinary ROS: no dysuria, trouble voiding, or hematuria  Musculoskeletal ROS: positive for pain in neck. No joint erythema or swelling Neurological ROS: no dizziness, fainting, seizures, tremors, TIA or stroke symptoms  Dermatological ROS: negative for hair changes, nail changes and pruritus      Objective:     Vitals: BP 128/77  ~ Pulse 91  ~ Temp 36 ???C (96.8 ???F) (Oral)  ~ Resp 16  ~ Ht 1.626 m (5' 4'') Comment: Obtained from chart ~ Wt 62.1 kg (137 lb)  ~ SpO2 94%  ~ BMI 23.52 kg/m???  Body mass index is 23.52 kg/m???.       Constitutional: alert and  not in acute distress  Mental Status: Alert and oriented to person, place, and time.  Recent and remote memory are intact.   Eyes: Orbits, eyelids, conjunctivae and sclera are normal in appearance. Pupils are equal, round, reactive to light with extraocular movements intact.  ENT: mucous membranes moist, oral and nasal cavities without lesions, uvula in midline  Cardiac: regular rate and rhythm and peripheral pulses  strong and equal  Chest: clear to auscultation bilaterally  Abdomen: soft, nontender, nondistended  Lymph nodes: no enlarged lymph nodes in bilaterally axillae  Skin: Inspection of the head and neck, trunk and extremities is normal.  Extremity: Examination of the bilateral elbows, wrists, knees and ankles reveals no crepitus or swelling. Joints are stable and range of motion is normal.  Neurologic: Cranial nerves II-XII grossly intact.      Labs:     Lab Results   Component Value Date    WBC 6.88 07/13/2019    HGB 8.4 (L) 07/13/2019    HCT 28.0 (L) 07/13/2019    MCV 90.3 07/13/2019    PLT 601 (H) 07/13/2019       Lab Results   Component Value Date    CREAT 0.97 07/13/2019    BUN 10 07/13/2019    NA 142 07/13/2019    K 4.2 07/13/2019    CL 108 (H) 07/13/2019    CO2 19 (L) 07/13/2019           Imaging:     All available imaging and studies were reviewed.    07/14/2019 [place] upper GI:  Scout radiograph findings: Normal bowel gas pattern. Previously administered oral contrast is in the colon and rectum. Surgical sutures in the left upper quadrant.  ??? Esophageal caliber: Normal with no focal narrowing.  Esophageal motility: Normal.  Hiatal hernia: None. Status post hiatal hernia repair.  Gastroesophageal reflux: None observed.  ???  Stomach: Post surgical changes from gastrojejunostomy.  Visualized proximal small bowel: Normally distended with normal fold pattern. Rapid appropriate transit of contrast.  ???  ???  IMPRESSION:   ???  Status post gastrojejunostomy and hiatal hernia repair with normal esophageal motility and transit of contrast into the gastric remnant and jejunum.    07/12/2019 CT chest:  Lungs: No consolidation or suspicious pulmonary nodules. Diffuse moderate centrilobular and paraseptal emphysema. Bilateral diffuse reticular opacities with basilar predominance consistent with scarring/fibrotic changes. Bilateral cylindrical and   varicoid bronchiectasis. Bibasilar atelectasis.  ???  Lymph nodes: No mediastinal, hilar, or axillary lymphadenopathy.  ???  Pleura: Interval resolution of left small pleural effusion. No residual effusions. No pneumothorax.  ???  Cardiovascular: Normal heart size. Normal pericardium. No pericardial effusion. Triple-vessel severe atherosclerotic coronary artery disease. Moderate calcifications of the aorta and branch vessels. Aortic valve calcifications. There is an aberrant right   subclavian artery which does not appear to have mass effect on the esophagus.  ???  Osseous: No suspicious osseous lesions.  ???  Upper abdomen: Status post antrectomy, gastrojejunostomy, and hiatal hernia repair. Stable distal esophageal thickening. Lobular hyperdensity is again noted at the GE junction, stable. Redemonstration of distended gallbladder with stable, partially   visualized pericholecystic changes. Mild intrahepatic biliary dilatation, stable. Interval removal of left upper quadrant drainage catheter. Bilateral extrarenal pelvises of the kidneys. Midline anterior laparotomy scar. Atherosclerotic calcifications   narrow the  proximal SMA.  ???  ??? IMPRESSION:  ???  1. No acute findings within the chest. There is an aberrant right subclavian artery which does not appear to have mass effect on the esophagus.  2. Diffuse centrilobular and paraseptal emphysema with bibasilar predominant reticular opacities, consistent with scarring/fibrotic changes.  3. Status post antrectomy, gastrojejunostomy, and hiatal hernia repair. Stable mild distal esophageal thickening query inflammation. Interval removal of left upper quadrant drainage catheter.  4. Redemonstration of distended gallbladder with biliary dilatation, grossly stable.   5. Triple-vessel severe atherosclerotic coronary artery disease.  6. Interval resolution  of bilateral pleural effusions.    CT neck 07/12/2019:  The airway including the nasopharynx, oropharynx, larynx and trachea is unremarkable. The epiglottis is normal, not enlarged. The vocal cords are symmetric.   ???  The thyroid, submandibular and parotid glands are unremarkable. The visualized brain and orbits appear normal. The paranasal sinuses are clear. Partially imaged nonspecific right mastoid effusion.   ???  No neck masses. There is no lymphadenopathy by CT size criteria.  ???  Vascular calcifications involving the bilateral carotid bulbs without high-grade stenosis.   ???  Aberrant origin of the right subclavian artery.   ???  There are mild degenerative changes of the cervical spine without high-grade canal or neural foraminal stenosis.  ???  ???  See concurrently dictated CT chest for additional findings below the thoracic inlet.  ???  Pulmonary apices demonstrate centrilobular and paraseptal emphysema.  ???  ???  IMPRESSION:  ???  No neck mass or cervical lymphadenopathy.   ???  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   ???  Partially imaged nonspecific right mastoid effusion.       ???  Assessment & Plan:     Haeli Gerlich is a 53 y.o. female with a PMH of PUD s/p multiple surgeries, now s/p lap vagotomy and antrectomy, gastrojejunostomy, and hiatal hernia repair on 06/21/2019, admitted with odynophagia.     Patient is 23 days postop with negative work up so far. Opioids medications are not indicated for chronic pain. Recommend short course at discharge. If patient continues to be unable to wean opioids, recommend starting buprenorphine in the outpatient setting.  Recommendations:  - Continue non-opioid adjuvants including Lidocaine 5% patch and Tylenol 325 mg Q 6 hours PRN  - Consider starting Robaxin 500 mg TID  - Continue Dilaudid 2 mg Q6 hours PRN. Wean as tolerated  - Continue Oxycodone soln 15/20 mg Q6 hours PRN. Wean as tolerated  - Recommend outpatient referral with pain management and addiction medicine if pt continues to have post-surgical pain requiring opioids    - Recommend holding parameters for opioids: RR<12 or sedation.  - Please contact Chronic Pain pager [phone] for questions.  Our recommendations for plan of care were reviewed with the primary team.  This patient was seen and discussed with attending Dr. Carmin Richmond.  Author:  Harle Battiest. Evette Cristal, MD 07/14/2019 2:27 PM

## 2019-07-14 NOTE — Discharge Summary
DISCHARGE SUMMARY/ GENERAL SURGERY / [name]  PATIENT:  Donna Gross MRN:  [id number]  DOB:  Nov 15, 1966    Attending Physician: Alonna Buckler., MD  Primary Care Physician:  [facility]    Admission Date:  07/12/19    Discharge Date:  07/15/19     Admission Diagnosis:    Peptic ulcer disease [K27.9]  Odynophagia [R13.10]     Discharge Diagnosis:    Peptic ulcer disease [K27.9]  Odynophagia [R13.10]    Surgery Performed:   None    Consultations:   Acute Pain Service    Chronic Conditions:  Past Medical History:   Diagnosis Date   ??? Anxiety    ??? Depression    ??? Emphysema, unspecified (HCC/RAF)    ??? Fibromyalgia    ??? Gastritis    ??? GERD (gastroesophageal reflux disease)    ??? Pancreatitis        HPI: Donna Gross is a 53 y.o. female with history of complex peptic ulcer disease s/p multiple surgeries, now s/p laparoscopic vagotomy and antrectomy, gastrojejunostomy, and hiatal hernia repair (06/21/19; POD21). Patient was discharged 07/02/19 in good condition after resolution of swallowing pain that patient first reported on POD8 (06/29/19). She presents now with complaints of right-sided neck pain, pain with swallowing, and mild nausea.  ???  Patient describes developing pain in her right lateral neck immediately after surgery. This pain is dull, constant, and exacerbated with swallowing solids or liquids; she also has the sensation of food getting stuck in her mid-neck after PO intake. Her initial reflux symptoms have been well-controlled since surgery and she does not having the feeling of food being stuck in her chest. With effort she is able to swallow solids and liquids, but they sometimes are regurgitated after swallowing. Her emesis typically appears like the food she had just ingested. Her symptoms had been improving up until discharge; since then she has had worsening symptoms necessitating intake of only limited-chew items such as soup, Jello, and mashed potatoes. She has undergone EGD in the past but never laryngoscopy or any other evaluation for her throat symptoms. With regards to her abdomen, her pain has been improving since surgery and remains tolerable on oral opioids (oxycodone, Dilaudid, and sucralfate). She has not had fevers/chills and is having consistent soft (occasionally runny) BMs. She is voiding without issue. She has recently started walking around her block at home without issue. She is also taking her regular home PPI, albuterol inhaler, and muscle relaxant.  ???  In ED, vitals and labs were WNL aside from mildly elevated amylase/lipase. Abdominal XR was WNL.    Brief Hospital Course:     Patient was admitted to hospital on 07/12/19 for odynophagia, right sided neck pain, and mild nausea that returned and worsened after her previous discharge. She was scoped by ENT which showed no abnormalities. She had a modified barium swallow which only showed retained contents in esophagus. Finally she underwent an upper GI follow-through which again did not show any abnormalities.  Overnight on 07/12/19 she had two episodes of hypotension to 70's/50's which resolved with IVF bolus of 1L and 500cc. Blood pressure was stable since 07/13/19.  She had significant opioid needs throughout the hospital stay, and chronic pain was consulted to set up outpatient f/u and give recommendations for discharge.  On 7/24 after all study results came back as normal the patient reported improvement in her symptoms and a desire to leave. As she was medically stable  with no objective abnormalities, and pain controlled on the inpatient regimen, she was deemed safe for discharge.    Physical Exam:  BP 128/77  ~ Pulse 91  ~ Temp 36 ???C (96.8 ???F) (Oral)  ~ Resp 16  ~ Ht 1.626 m (5' 4'') Comment: Obtained from chart ~ Wt 62.1 kg (137 lb)  ~ SpO2 94%  ~ BMI 23.52 kg/m???   General: No acute distress.   Neuro: Alert & oriented x 4.   HEENT: Oropharynx moist without exudates. Cardiovascular: Regular rate and rhythm.     Chest: Clear to auscultation bilaterally.  GI: Abdomen soft, non-tender, non-distended  GU:  Voiding without difficulty.   Extremities: Moving all four extremities, no peripheral edema noted.   Incisions c/d/i.       Discharge Medications:   Current Discharge Medication List      CONTINUE these medications which have CHANGED    Details   oxyCODONE 5 mg/5 mL solution Take 5 to15 ml (5-15mg ) by mouth every 4-8 hours as needed for pain.Rob Bunting: 473 mL, Refills: 0         CONTINUE these medications which have NOT CHANGED    Details   albuterol (2.5 mg/76mL) 0.083% nebulizer solution Take 2.5 mg by nebulization every eight (8) hours as needed (SOB) .      albuterol 90 mcg/act inhaler Inhale 2 puffs every eight (8) hours as needed (SOB).      aluminum-magnesium hydroxide-simethicone (ALMACONE) 200-200-20 mg/5 mL suspension Take 5 mLs by mouth three (3) times daily.      aluminum-magnesium hydroxide-simethicone 400-400-40 mg/5 mL suspension Take 10 mLs by mouth every four (4) hours as needed.  Qty: 355 mL, Refills: 3      clobetasol 0.05% cream Apply topically three (3) times daily as needed (rash).      cyclobenzaprine 10 mg tablet Take 10 mg by mouth two (2) times daily as needed for Muscle spasms.      dicyclomine 20 mg tablet Take 0.5 tablets (10 mg total) by mouth two (2) times daily.  Qty: 10 tablet, Refills: 0      fluticasone-salmeterol (WIXELA INHUB) 250-50 mcg/dose diskus Inhale 1 puff two (2) times daily as needed (SOB).      HYDROmorphone 2 mg tablet Take 1 tablet (2 mg total) by mouth every four (4) hours as needed for Severe Pain (Pain Scale 7-10) (please try oxycodone first (wait at least 1 hour after oxycodone administration), only if not yet due for next oxycodone. 2nd line). Max Daily Amount: 12 mg  Qty: 30 tablet, Refills: 0    Associated Diagnoses: Acute epigastric pain      ipratropium-albuterol 20-100 mcg/act inhaler Inhale 2 puffs three (3) times daily as needed (SOB).      lidocaine 5% patch Place 1 patch onto the skin daily 12 hours on 12 hours off.Rob Bunting: 30 patch, Refills: 0      lidocaine viscous 2% oral solution Swish and spit 5 mLs three (3) times daily.      menthol 5 mg lozenge Use as directed 1 lozenge (5 mg total) in the mouth or throat every four (4) hours as needed for Cough or Sore Throat.  Qty: 30 lozenge, Refills: 0      Naloxone HCl 4 MG/0.1ML LIQD Call 911. Administer a single spray intranasally into one nostril for opioid overdose. May repeat in 3 minutes if patient is not breathing.Rob Bunting: 2 each, Refills: 2    Comments: Pharmacist may  substitute non-preferred auto-injector or self-injector formulations, if needed.      ondansetron 4 mg tablet Take 1 tablet (4 mg total) by mouth every six (6) hours as needed for Nausea.  Qty: 12 tablet, Refills: 0      PANTOPRAZOLE SODIUM PO Take 40 mg by mouth daily.      promethazine 6.25 mg/5 mL solution Take 6.25 mg by mouth every six (6) hours as needed (cough).      quetiapine 100 mg tablet Take 1 tablet (100 mg total) by mouth two (2) times daily.      sucralfate 1 g/10 mL suspension Take 10 mLs (1 g total) by mouth three (3) times daily with meals and at bedtime.  Qty: 420 mL, Refills: 3    Associated Diagnoses: Acute epigastric pain              Disposition: Home.      Discharge Condition: Stable    Diet: Regular      Activity/ Restrictions: Ambulate as tolerated.     Return Precautions: As listed on After Visit Summary (AVS), but also including:      - Onset of severe, persistent pain not relieved by medication and rest.      - Difficulty obtaining medications      - Any new onset of or increased weakness, numbness or tingling      - Persistent chills; new onset of fever > 101 degrees F, or night sweats      - Any redness, swelling, drainage, heat, or pain around your incision      - Any new onset of chest pain or shortness of breath       Post-Discharge Referrals: None Post-Discharge Appointments:   The patient was asked to follow up for a clinic visit at [facility] in 2 weeks.      Additional appointments should be made with:   Follow up with your primary care doctor in 2 weeks.     The patient was seen and examined by the General Surgery team.  Above plan was discussed with chief resident and attending physician, Alonna Buckler., MD, who were in agreement with the plan.    Author: Veryl Speak. [employer]   07/14/2019 1:13 PM  Division of Thoracic Surgery  [facility]

## 2019-07-14 NOTE — Other
Patients Clinical Goal:   Clinical Goal(s) for the Shift: stable vital signs, pain management, safety and comfort  Identify possible barriers to advancing the care plan: underlying condition  Stability of the patient: Moderately Stable - low risk of patient condition declining or worsening   End of Shift Summary: Patient is alert and oriented x4, BMAT 4 with steady gait, tolerating her diet, voiding well, BM today, pain controlled, discharge ordered, patient given instructions, she verbalized understanding, IV removed, patient ambulated to daughter's car accompanied by care partner per her request, patient will pick up her discharge meds at [address] [facility] per her request.

## 2019-08-09 ENCOUNTER — Inpatient Hospital Stay
Admit: 2019-08-09 | Discharge: 2019-08-09 | Disposition: A | Discharge status: Home / Self Care | Payer: PRIVATE HEALTH INSURANCE | Source: Home / Self Care

## 2019-08-09 ENCOUNTER — Ambulatory Visit: Payer: PRIVATE HEALTH INSURANCE

## 2019-08-09 DIAGNOSIS — K921 Melena: Secondary | ICD-10-CM

## 2019-08-09 DIAGNOSIS — R109 Unspecified abdominal pain: Secondary | ICD-10-CM

## 2019-08-09 LAB — Prothrombin Time Panel: PROTHROMBIN TIME: 12.5 s (ref 11.5–14.4)

## 2019-08-09 LAB — Differential Automated: EOSINOPHIL PERCENT, AUTO: 2.4 (ref 1.30–3.40)

## 2019-08-09 LAB — Basic Metabolic Panel
POTASSIUM: 4.5 mmol/L (ref 3.6–5.3)
UREA NITROGEN: 12 mg/dL (ref 7–22)

## 2019-08-09 LAB — CBC: ABSOLUTE NUCLEATED RBC COUNT: 0 10*3/uL (ref 0.00–0.00)

## 2019-08-09 LAB — Extra Light Green Top

## 2019-08-09 LAB — Amylase: AMYLASE: 142 U/L — ABNORMAL HIGH (ref 31–124)

## 2019-08-09 LAB — Hepatic Funct Panel: BILIRUBIN,TOTAL: 0.2 mg/dL (ref 0.1–1.2)

## 2019-08-09 LAB — Lipase: LIPASE: 76 U/L — ABNORMAL HIGH (ref 9–63)

## 2019-08-09 MED ORDER — LANSOPRAZOLE 30 MG PO CPDR
30 mg | ORAL_CAPSULE | Freq: Every day | ORAL | 0 refills | Status: AC
Start: 2019-08-09 — End: ?

## 2019-08-09 MED ORDER — OXYCODONE-ACETAMINOPHEN 5-325 MG PO TABS
1 | ORAL_TABLET | ORAL | 0 refills | Status: AC | PRN
Start: 2019-08-09 — End: ?

## 2019-08-09 MED ADMIN — MORPHINE SULFATE (PF) 4 MG/ML IV SOLN: 4 mg | INTRAVENOUS | @ 19:00:00 | Stop: 2019-08-09 | NDC 00641612525

## 2019-08-09 MED ADMIN — SODIUM CHLORIDE 0.9 % IV BOLUS: 1000 mL | INTRAVENOUS | @ 19:00:00 | Stop: 2019-08-09 | NDC 00338004904

## 2019-08-09 MED ADMIN — ACETAMINOPHEN 325 MG PO TABS: 650 mg | ORAL | @ 22:00:00 | Stop: 2019-08-10

## 2019-08-09 MED ADMIN — CETIRIZINE HCL 10 MG PO TABS: 10 mg | ORAL | @ 20:00:00 | Stop: 2019-08-10 | NDC 51079059720

## 2019-08-09 MED ADMIN — IOHEXOL 350 MG/ML IV SOLN: 125 mL | INTRAVENOUS | @ 21:00:00 | Stop: 2019-08-09 | NDC 00407141476

## 2019-08-09 MED ADMIN — ONDANSETRON HCL 4 MG/2ML IJ SOLN: 4 mg | INTRAVENOUS | @ 19:00:00 | Stop: 2019-08-09 | NDC 60505613005

## 2019-08-09 NOTE — ED Notes
Pt reporting itching to left forearm, states '' I think it's the morphine.'' Pt scratching at IV site.  Redness noted to left forearm/ IV site. MD Dabby notified. Will medicate per MD order.

## 2019-08-09 NOTE — Progress Notes
DATE OF SERVICE: 08/09/2019    ATTENDING PHYSICIAN: Stana Bunting. Carmin Richmond, MD    REFERRING PHYSICIAN: Kelle Darting,*    CHIEF COMPLAINT: abdominal pain and back pain    BACKGROUND PAIN HISTORY: The patient is a 53 y.o. female with a past medical history significant for anxiety, depression, emphysema, CAD, and PUD s/p multiple surgeries, now s/p lap vagotomy and antrectomy, gastrojejunostomy, and hiatal hernia repair on 06/21/2019, recently admitted with right neck pain, odynophagia, and nausea 07/12/19. During that admission, patient was scoped by ENT which showed no abnormalities. She had a modified barium swallow which only showed retained contents in esophagus. Finally she underwent an upper GI follow-through which again did not show any abnormalities. Patient's pain improved and she was discharged 7/24.  Dr. Charm Barges, Josem Kaufmann,* has requested that Dr. Stana Bunting. Carmin Richmond, MD of the [facility] render an opinion regarding further evaluation and possible management of abdominal pain.     Today, patient reports worsening diffuse abdominal pain that started over 7 years ago and has progressively worsened since her last surgery 07/12/19. Patient also reports longstanding history (10+ years) of mid to lower back pain without radiation.    CURRENT PAIN PRESENTATION:    Patient reports onset of diffuse abdominal pain over 7 years ago, associated with a very complex abdominal operative history with multiple laparotomies, perforated ulcer, longstanding chronic ulcer disease, prior complications related to this, including a transverse colonic fistula, subsequent transverse colectomy and fistula takedown, prior diverting ostomy, and a prior closure of the abdominal wall with anterior component separation. Patient still with chronic abdominal pain - uncertain whether due to refractory PUD vs hostile abdomen with extensive adhesions. Given nonresolution of significant duodenal ulcer on last endoscopy, patient underwent definitive acid-reducing operation and is s/p lap vagotomy and antrectomy, gastrojejunostomy, and hiatal hernia repair on 06/21/2019. Patient discharged 7/12 and readmitted 7/22 with right neck pain, odynophagia, and nausea, for which workup with CT neck, CT chest, and barium swallow was unremarkable.    Today, patient reports worsening diffuse abdominal pain since discharge that she describes as constant and sharp in nature. Pain score on VAS is a 10/10. Food worsens the abdominal pain, and she has been unable to keep solids and liquids down since discharge. She reports the feeling of food getting ''stuck'' in her esophagus. 1-2 days ago, patient also noted blood in her stool, which is a new finding for her. She reports associated fatigue but denies lightheadedness and dizziness. Patient reports that the pain medication she was discharged with (oxycodone and dilaudid) helped the pain a little bit but did not solve the problem.    Regarding patient's back pain, patient reports chronic bilateral low back pain without radiation over the last 10 years. This pain is a constant pain, described as sharp in nature, rated 8-9/10 on VAS in severity. The pain is worse with all movement, only improved with rest.    Current pain treatment:  none    Treatment history includes:     Procedure history: no history injections    Opioid medications: oxycodone, dilaudid    NSAIDs: contraindicated in setting of PUD    Muscle Relaxants: flexeril 10 mg BID prn    Neuropathic pain medications: none    Other pharmacologic treatment: none    Physical Therapy: none    Home exercise program: none    Chiropractor: none    Acupuncture: none    Other non-pharmacologic treatment: none    CURES Report:  Last reviewed on 08/09/2019         Report is consistent with stated usage.    PAST MEDICAL HISTORY:    Past Medical History:   Diagnosis Date   ??? Anxiety ??? Depression    ??? Emphysema, unspecified (HCC/RAF)    ??? Fibromyalgia    ??? Gastritis    ??? GERD (gastroesophageal reflux disease)    ??? Pancreatitis        PAST SURGICAL HISTORY:     Past Surgical History:   Procedure Laterality Date   ??? ABDOMINAL SURGERY     ??? COLON SURGERY         CURRENT MEDICATIONS:    Current Outpatient Medications   Medication Sig   ??? albuterol (2.5 mg/26mL) 0.083% nebulizer solution Take 2.5 mg by nebulization every eight (8) hours as needed (SOB) .   ??? albuterol 90 mcg/act inhaler Inhale 2 puffs every eight (8) hours as needed (SOB).   ??? aluminum-magnesium hydroxide-simethicone (ALMACONE) 200-200-20 mg/5 mL suspension Take 5 mLs by mouth three (3) times daily.   ??? aluminum-magnesium hydroxide-simethicone 400-400-40 mg/5 mL suspension Take 10 mLs by mouth every four (4) hours as needed.   ??? clobetasol 0.05% cream Apply topically three (3) times daily as needed (rash).   ??? cyclobenzaprine 10 mg tablet Take 10 mg by mouth two (2) times daily as needed for Muscle spasms.   ??? dicyclomine 20 mg tablet Take 0.5 tablets (10 mg total) by mouth two (2) times daily.   ??? fluticasone-salmeterol (WIXELA INHUB) 250-50 mcg/dose diskus Inhale 1 puff two (2) times daily as needed (SOB).   ??? HYDROmorphone 2 mg tablet Take 1 tablet (2 mg total) by mouth every six (6) hours as needed for Severe Pain (Pain Scale 7-10) (please try oxycodone first (wait at least 1 hour after oxycodone administration), only if not yet due for next oxycodone. 2nd line). Max Daily Amount: 8 mg   ??? ipratropium-albuterol 20-100 mcg/act inhaler Inhale 2 puffs three (3) times daily as needed (SOB).   ??? lidocaine 5% patch Place 1 patch onto the skin daily 12 hours on 12 hours off.Marland Kitchen   ??? lidocaine viscous 2% oral solution Swish and spit 5 mLs three (3) times daily.   ??? menthol 5 mg lozenge Use as directed 1 lozenge (5 mg total) in the mouth or throat every four (4) hours as needed for Cough or Sore Throat. ??? Naloxone HCl 4 MG/0.1ML LIQD Call 911. Administer a single spray intranasally into one nostril for opioid overdose. May repeat in 3 minutes if patient is not breathing.Marland Kitchen   ??? ondansetron 4 mg tablet Take 1 tablet (4 mg total) by mouth every six (6) hours as needed for Nausea.   ??? oxyCODONE 5 mg tablet Take 1-3 tabs (5-15 mg total) by mouth every 6-8 hours as needed for pain.Marland Kitchen   ??? PANTOPRAZOLE SODIUM PO Take 40 mg by mouth daily.   ??? promethazine 6.25 mg/5 mL solution Take 6.25 mg by mouth every six (6) hours as needed (cough).   ??? sucralfate 1 g/10 mL suspension Take 10 mLs (1 g total) by mouth three (3) times daily with meals and at bedtime.   ??? quetiapine 100 mg tablet Take 1 tablet (100 mg total) by mouth two (2) times daily.     No current facility-administered medications for this visit.         ALLERGIES:     Allergies   Allergen Reactions   ??? Hydrocodone Other (See Comments)     ''  burns a hole in stomach''   ??? Ibuprofen Other (See Comments)     GI discomfort    ''hurts my stomach''       FAMILY HISTORY:    Family History   Problem Relation Age of Onset   ??? Heart disease Mother    ??? Diabetes Brother    ??? Diabetes Father    ??? Anesthesia problems Neg Hx    ??? Malignant hypertension Neg Hx    ??? Hypotension Neg Hx    ??? Malignant hyperthermia Neg Hx    ??? Pseudochol deficiency Neg Hx        SOCIAL HISTORY:    Social History     Socioeconomic History   ??? Marital status: Single     Spouse name: Not on file   ??? Number of children: Not on file   ??? Years of education: Not on file   ??? Highest education level: Not on file   Occupational History   ??? Not on file   Social Needs   ??? Financial resource strain: Not on file   ??? Food insecurity:     Worry: Not on file     Inability: Not on file   ??? Transportation needs:     Medical: Not on file     Non-medical: Not on file   Tobacco Use   ??? Smoking status: Current Every Day Smoker     Packs/day: 0.25     Types: Cigarettes   ??? Smokeless tobacco: Current User ??? Tobacco comment: 30 years    Substance and Sexual Activity   ??? Alcohol use: No   ??? Drug use: No   ??? Sexual activity: Never   Lifestyle   ??? Physical activity:     Days per week: Not on file     Minutes per session: Not on file   ??? Stress: Not on file   Relationships   ??? Social connections:     Talks on phone: Not on file     Gets together: Not on file     Attends religious service: Not on file     Active member of club or organization: Not on file     Attends meetings of clubs or organizations: Not on file     Relationship status: Not on file   Other Topics Concern   ??? Not on file   Social History Narrative   ??? Not on file        REVIEW OF SYSTEMS:    General ROS: negative for fatigue; negative for chills, fever  Psychological ROS: negative for anxiety, depression and insomnia  Ophthalmic ROS: negative for blurry vision, decreased vision or double vision  ENT ROS: negative for vertigo, visual changes or vocal changes  Allergy and Immunology ROS: negative for nasal congestion, nasal discharge or postnasal drip  Hematological and Lymphatic ROS: negative for bleeding problems, blood clots or bruising  Endocrine ROS: negative for mood swings, palpitations or polydipsia/polyuria  Breast ROS: negative for lumps, discharge or erythema  Respiratory ROS: no cough, shortness of breath, or wheezing  Cardiovascular ROS: no chest pain, palpitations or dyspnea on exertion  Gastrointestinal ROS: no abdominal pain, change in bowel habits, or black or bloody stools  Genito-Urinary ROS: no dysuria, trouble voiding, or hematuria  Musculoskeletal ROS: positive for pain in back and abdomen. No joint erythema or swelling  Neurological ROS: no dizziness, fainting, seizures, tremors, TIA or stroke symptoms  Dermatological ROS: negative for hair changes, nail changes  and pruritus      PHYSICAL EXAM:    Vitals:    08/09/19 0941   BP: 144/79   BP Location: Left arm   Patient Position: Sitting   Cuff Size: Regular   Pulse: 84 Temp: 36.2 ???C (97.1 ???F)   TempSrc: Forehead   SpO2: 98%   Weight: 64 kg (141 lb)   Height: 1.626 m (5' 4'')        Constitutional: healthy,  alert and  not in acute distress  Mental Status: Alert and oriented to time, place and person.  Recent and remote memory are intact. Mood and affect are normal.  Eyes: Orbits, eyelids, conjunctivae and sclera are normal in appearance. Pupils are equal, round, reactive to light with extraocular movements intact.  ENT: mucous membranes moist, oral and nasal cavities without lesions, uvula in midline  Cardiac: regular rate and rhythm and peripheral pulses strong and equal.  Abdomen: soft, diffusely tender, voluntary guarding. No masses or hepatosplenomegaly noted. Bowel sounds active.  Skin: Inspection of the head and neck, trunk and extremities is normal.  Lymphatic: negative for enlarged lymph nodes in the axillae bilaterally.    Musculoskeletal:     Gait and station are normal.   Inspection of the lumbar spine reveals no scoliosis.   Pain with palpation of the thoracic facets and thoracic intervertebral spaces.    Pain with palpation with lumbar facets and lumbar intervertebral spaces. Facet loading is positive bilaterally.    No pain with palpation of the bilateral sacroiliac joints. Patrick???s, Gaenslen???s, and Yeoman's tests are negative.   No pain with palpation of the bilateral trochanteric bursae.   No pain with palpation of the bilateral piriformis muscles.  Negative piriformis stretch bilaterally.  No pain with internal and external rotation of bilateral hips.    Straight leg raising was negative at 70 degrees bilaterally.    Range of motion of the lumbar spine is 90 degrees in anterior flexion without pain, 20 degrees in extension with pain, 20 degrees of left lateral rotation with pain, 20 degrees of right lateral rotation with pain.   There are no palpable trigger points in the muscles of the low back.     Head/Neck: Inspection for the neck reveals a supple, nontender cervical spine without pain on palpation of the cervical intervertebral spaces and facets.   Range of motion of the cervical spine is 40 degrees in anterior flexion without pain, 60 degrees in extension without pain, 80 degrees of left lateral rotation without pain, 80 degrees of right lateral rotation without pain.    Facet loading negative bilaterally.    Spurling's test negative bilaterally.  There are no palpable trigger points in the muscles of the head and neck.     Extremity: Examination of the bilateral elbows, wrists, knees and ankles reveals no crepitus or swelling. Joints are stable and range of motion is normal.    Neurologic: Cranial nerves II-XII grossly intact. Deep tendon reflexes are 2+/4 at the bilateral biceps, triceps, brachioradialis, patellar and Achilles tendons. Motor strength is 5/5 at the bilateral upper and lower extremity flexors and extensors. Sensation is normal in bilateral upper and lower extremities.    IMAGING/STUDIES:     07/14/2019 [place] upper GI:  [facility] radiograph findings: Normal bowel gas pattern. Previously administered oral contrast is in the colon and rectum. Surgical sutures in the left upper quadrant.  ???  Esophageal caliber: Normal with no focal narrowing.  Esophageal motility: Normal.  Hiatal hernia: None. Status post  hiatal hernia repair.  Gastroesophageal reflux: None observed.  ???  Stomach: Post surgical changes from gastrojejunostomy.  Visualized proximal small bowel: Normally distended with normal fold pattern. Rapid appropriate transit of contrast.  ???  IMPRESSION:   ???  Status post gastrojejunostomy and hiatal hernia repair with normal esophageal motility and transit of contrast into the gastric remnant and jejunum.  ???    07/12/2019 CT chest:  Lungs: No consolidation or suspicious pulmonary nodules. Diffuse moderate centrilobular and paraseptal emphysema. Bilateral diffuse reticular opacities with basilar predominance consistent with scarring/fibrotic changes. Bilateral cylindrical and   varicoid bronchiectasis. Bibasilar atelectasis.  ???  Lymph nodes: No mediastinal, hilar, or axillary lymphadenopathy.  ???  Pleura: Interval resolution of left small pleural effusion. No residual effusions. No pneumothorax.  ???  Cardiovascular: Normal heart size. Normal pericardium. No pericardial effusion. Triple-vessel severe atherosclerotic coronary artery disease. Moderate calcifications of the aorta and branch vessels. Aortic valve calcifications. There is an aberrant right  ???subclavian artery which does not appear to have mass effect on the esophagus.  ???  Osseous: No suspicious osseous lesions.  ???  Upper abdomen: Status post antrectomy, gastrojejunostomy, and hiatal hernia repair. Stable distal esophageal thickening. Lobular hyperdensity is again noted at the GE junction, stable. Redemonstration of distended gallbladder with stable, partially   visualized pericholecystic changes. Mild intrahepatic biliary dilatation, stable. Interval removal of left upper quadrant drainage catheter. Bilateral extrarenal pelvises of the kidneys. Midline anterior laparotomy scar. Atherosclerotic calcifications   narrow the ???proximal SMA.  ???  ???  IMPRESSION:  ???  1. No acute findings within the chest. There is an aberrant right subclavian artery which does not appear to have mass effect on the esophagus.  2. Diffuse centrilobular and paraseptal emphysema with bibasilar predominant reticular opacities, consistent with scarring/fibrotic changes.  3. Status post antrectomy, gastrojejunostomy, and hiatal hernia repair. Stable mild distal esophageal thickening query inflammation. Interval removal of left upper quadrant drainage catheter.  4. Redemonstration of distended gallbladder with biliary dilatation, grossly stable.   5. Triple-vessel severe atherosclerotic coronary artery disease. 6. Interval resolution of bilateral pleural effusions.  ???    CT neck 07/12/2019:  The airway including the nasopharynx, oropharynx, larynx and trachea is unremarkable. The epiglottis is normal, not enlarged. The vocal cords are symmetric.   ???  The thyroid, submandibular and parotid glands are unremarkable. The visualized brain and orbits appear normal. The paranasal sinuses are clear. Partially imaged nonspecific right mastoid effusion.   ???  No neck masses. There is no lymphadenopathy by CT size criteria.  ???  Vascular calcifications involving the bilateral carotid bulbs without high-grade stenosis.   ???  Aberrant origin of the right subclavian artery.   ???  There are mild degenerative changes of the cervical spine without high-grade canal or neural foraminal stenosis.  ???  ???  See concurrently dictated CT chest for additional findings below the thoracic inlet.  ???  Pulmonary apices demonstrate centrilobular and paraseptal emphysema.  ???  ???  IMPRESSION:  ???  No neck mass or cervical lymphadenopathy.   ???  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   ???  Partially imaged nonspecific right mastoid effusion.     All available studies and imaging have been reviewed.    IMPRESSION:     The patient is a 53 y.o. female with a past medical history significant for anxiety, depression, emphysema, CAD, and PUD s/p multiple surgeries, now s/p lap vagotomy  and antrectomy, gastrojejunostomy, and hiatal hernia repair on 06/21/2019, with recent admission for right neck pain, odynophagia, and nausea 7/22-7/24/20 with unremarkable workup (CT neck, CT chest, barium swallow), now presenting with diffuse abdominal pain, inability to tolerate po due to nausea, vomiting, and regurgitation, and clay-colored stools and hematochezia.     Differential diagnosis for patient's diffuse abdominal pain includes perforated ulcer, peptic ulcer disease, SBO secondary to known adhesions, acute cholecystitis, pancreatitis. Patient may be candidate for sympathetic blockade in future, but need to rule out acute pathology first.    Differential diagnosis for clay-colored stool and hematochezia includes lower GI bleed, hemorrhoids, anal fissure, upper GI bleed.    Differential diagnosis for dysphagia, odynophagia includes achalasia, esophageal dysmotility, esophagitis, esophageal stricture.    Recommend patient be seen in ED for workup of dysphagia, odynophagia, food intolerance, diffuse abdominal pain, possible GI bleed.    PLAN:     Interventions:    - none indicated at this time    Imaging:    - none indicated    Medications:    - no new medications indicated    Referrals:    - ED    Follow-up:    - Return to clinic after ED workup    [place] M. Aeschlimann, MD   Date: 08/09/2019  Time: 10:03 AM    ______________________________________________________________________    I performed a history and physical exam of the patient and discussed the management with the fellow. I reviewed the fellow's notes and agree with the documented findings and the plan of care.     Stana Bunting. Carmin Richmond, MD   Date: 08/09/2019  Time: 2:57 PM

## 2019-08-09 NOTE — ED Notes
Patient reports not true allergy to norco. Per patient ''I have had it before''. Patient request prescription for norco or oxycodone for pain. Patient presented nurse with empty bottle of dilaudid and norco.

## 2019-08-09 NOTE — ED Notes
Patient transported to CT In no acute distress.

## 2019-08-09 NOTE — ED Provider Notes
[facility]  Emergency Department Service Report    Donna Gross 53 y.o. female , presents with Blood In Stool and Achalasia      Triage   Arrived on 08/09/2019 at 11:05 AM   Arrived by Car [5]    ED Triage Vitals   Temp Temp Source BP Heart Rate Resp SpO2 O2 Device Pain Score Weight   08/09/19 1106 08/09/19 1106 08/09/19 1106 08/09/19 1106 08/09/19 1106 08/09/19 1106 08/09/19 1106 08/09/19 1120 08/09/19 1106   36.5 ???C (97.7 ???F) Oral 131/79 72 16 100 % None (Room air) Ten 63.5 kg (140 lb)       Pre hospital care:       Allergies   Allergen Reactions   ??? Hydrocodone Other (See Comments)     ''burns a hole in stomach''   ??? Ibuprofen Other (See Comments)     GI discomfort    ''hurts my stomach''       History   Patient is a 53 y.o. 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 Blood in stool is intermittent, streaking, bright red, and no alleviating or exacerbating factors are noted. Associated sx include diffuse abdominal pain, difficulty swallowing, nausea, non-bloody, non-bilious vomiting, and difficulty tolerating any POs. Patient denies hematemesis, fever. Patient was referred to the ED from pain management.      The history is provided by the patient. No language interpreter was used.   Rectal Bleeding    The current episode started 2 days ago. The onset was sudden. The problem occurs rarely. The problem has been unchanged. The pain is moderate. There was no prior successful therapy. There was no prior unsuccessful therapy. Associated symptoms include abdominal pain (diffuse), nausea and vomiting. Pertinent negatives include no fever and no diarrhea. She has been behaving normally.            Past Medical History:   Diagnosis Date   ??? Anxiety    ??? Depression ??? Emphysema, unspecified (HCC/RAF)    ??? Fibromyalgia    ??? Gastritis    ??? GERD (gastroesophageal reflux disease)    ??? Pancreatitis         Past Surgical History:   Procedure Laterality Date   ??? ABDOMINAL SURGERY     ??? COLON SURGERY          Past Family History   family history includes Diabetes in her brother and father; Heart disease in her mother.                 Past Social History   she reports that she has been smoking cigarettes. She has been smoking about 0.25 packs per day. She uses smokeless tobacco. She reports that she does not drink alcohol, use drugs, or engage in sexual activity.     Review of Systems   Constitutional: Negative for fever.   HENT: Positive for trouble swallowing.    Gastrointestinal: Positive for abdominal pain (diffuse), blood in stool, hematochezia, nausea and vomiting. Negative for diarrhea.   All other systems reviewed and are negative.      Physical Exam   Physical Exam  Vitals signs and nursing note reviewed.   Constitutional:       Appearance: Normal appearance.   HENT:  Head: Normocephalic.   Eyes:      Extraocular Movements: Extraocular movements intact.      Conjunctiva/sclera: Conjunctivae normal.   Cardiovascular:      Rate and Rhythm: Normal rate and regular rhythm.   Pulmonary:      Effort: Pulmonary effort is normal.      Breath sounds: Normal breath sounds.   Abdominal:      Palpations: Abdomen is soft.      Tenderness: There is abdominal tenderness (diffuse).   Musculoskeletal: Normal range of motion.   Skin:     General: Skin is warm.   Neurological:      General: No focal deficit present.      Mental Status: She is alert.   Psychiatric:         Mood and Affect: Mood normal.         Behavior: Behavior normal.         ED Course          Laboratory Results     Labs Reviewed   BASIC METABOLIC PANEL - Abnormal; Notable for the following components:       Result Value    Total CO2 19 (*)     All other components within normal limits LIPASE - Abnormal; Notable for the following components:    Lipase 76 (*)     All other components within normal limits   AMYLASE - Abnormal; Notable for the following components:    Amylase 142 (*)     All other components within normal limits   CBC (PERFORMABLE) - Abnormal; Notable for the following components:    Hemoglobin 11.3 (*)     Mean Corpuscular Hemoglobin 26.3 (*)     MCH Concentration 31.1 (*)     Red Cell Distribution Width-SD 48.5 (*)     Red Cell Distribution Width-CV 15.9 (*)     Platelet Count, Auto 452 (*)     Mean Platelet Volume 8.6 (*)     All other components within normal limits   PROTHROMBIN TIME PANEL - Normal   HEPATIC FUNCT PANEL - Normal   RAINBOW DRAW TO LABORATORY    Narrative:     The following orders were created for panel order [place] Draw to Laboratory.  Procedure                               Abnormality         Status                     ---------                               -----------         ------                     Extra Burna Mortimer ZOX[[id number]]                            Final result                 Please view results for these tests on the individual orders.   CBC & AUTO DIFFERENTIAL    Narrative:     The following orders were created for panel order CBC & Plt & Diff.  Procedure  Abnormality         Status                     ---------                               -----------         ------                     ZOX[[id number]]                          Abnormal            Final result               Differential, Automated[[id number]]                          Final result                 Please view results for these tests on the individual orders.   EXTRA LIGHT GREEN TOP   DIFFERENTIAL, AUTOMATED (PERFORMABLE)   POCT URINALYSIS DIPSTICK       Imaging Results     CT abd+pelvis w contrast   Preliminary Result by Raelyn Ensign., MD (08/19 1427)   IMPRESSION:      1.  No evidence of bowel obstruction. 2.  Stable gallbladder distention with trace pericholecystic fluid. Stable mild central intrahepatic biliary dilatation.         THIS IS A PRELIMINARY REPORT THAT HAS NOT BEEN REVIEWED BY AN ATTENDING RADIOLOGIST.      Dictated by: Corlis Hove   08/09/2019 2:23 PM          Administered Medications     Medication Administration from 08/09/2019 1106 to 08/09/2019 1429       Date/Time Order Dose Route Action Action by Comments     08/09/2019 1206 sodium chloride 0.9% IV soln bolus 1,000 mL 1,000 mL Intravenous New Bag/ Syringe/ Cartridge Bonita Quin Stanton, Isabela      08/09/2019 1206 ondansetron 4 mg/2 mL inj 4 mg 4 mg IV Push Given Dan Humphreys, RN      08/09/2019 1206 morphine PF 4 mg/mL inj 4 mg 4 mg IV Push Given Dan Humphreys, RN      08/09/2019 1244 cetirizine tab 10 mg 10 mg Oral Given Malissa Hippo, RN      08/09/2019 1400 iohexol (Omnipaque) 350 mg/mL inj 125 mL 125 mL Intravenous Given Bradly Chris L           Procedures   Procedures      MDM    Patient progress: improved    Patient presenting with abdominal pain and streaks of blood in stool ?ulcer vs hemorrhoids. Patient is hemodynamically stable, hemoglobin improved compared to baseline. Patient seen by case manager who will arrange for outpatient followup for EGD and colonoscopy. No e/o pancreatitis, hepatitis, cholecystitis on metabolic panel. No e/o UTI on urinalysis. No e/o appendicitis, diverticulitis, perforation or obstruction on CT. Patient improved with IV medications and hydration. Patient tol po's and pain is controled. Outpatient follow up with pmd.      I have reviewed the patient's vital signs and nursing notes and any labs or imaging studies that were performed in the ER. I had a detailed discussion with the patient  regarding the historical points, exam findings and any diagnostic results supporting the discharge diagnosis. I also discussed the need for outpatient follow up and the need to return to the ED if symptoms worsen or if there are any questions or concerns that arise at home.  The patient is well appearing, tolerating PO's and will follow up with PMD.    Consults: N/A    Clinical Impression     1. Abdominal pain, acute    2. Blood in stool        Prescriptions     New Prescriptions    LANSOPRAZOLE 30 MG DR CAPSULE    Take 1 capsule (30 mg total) by mouth daily.       Disposition and Follow-up   Disposition: Discharge [1]    No future appointments.    Follow up with:  GI    In 3 days  for further evaluation and treatment      Return precautions are specified on After Visit Summary.          The documentation on this chart was performed by Pam Drown, scribed for Tera Mater., MD    08/09/2019 11:35 AM     All scribe entries and documentation made by the scribe were entered at my direction.  I have reviewed this medical record and agree to the accuracy and completeness of the content entered by the scribe.  The documentation recorded by the scribe accurately reflects the service I personally performed and the decisions made by me.    Tera Mater., MD 2:31 PM 08/09/2019             Tera Mater., MD  08/09/19 [phone]

## 2019-08-09 NOTE — ED Notes
Report given to  Ashleigh RN for lunch break. Patient introduced to nurse and notified of plan of care. Patient in no acute distress.

## 2019-08-09 NOTE — ED Notes
IV insertion attempted x 1 on left AC and x 1 on right AC without success.

## 2019-08-09 NOTE — ED Notes
COLLECTIVE?NOTIFICATION?08/09/2019 11:05?Alethia Berthold?MRN: [id number]    [facility] [name]:   JXB:[id number]  Account [id number]  Billing Account [id number]      Criteria Met      History of Sepsis Dx    2 Visits in 30 Days    Security and Safety  No recent Security Events currently on file    ED Care Guidelines  There are currently no ED Care Guidelines for this patient. Please check your facility's medical records system.    Flags      History of Sepsis - Patient has received a diagnosis of Sepsis from an acute or post-acute setting. Apply appropriate clinical planning practices; to learn more visit [email or web address] / Attributed By: [employer] / Attributed On: 12/22/2018           E.D. Visit Count (12 mo.)  Facility Visits   [facility] [place] 1   [name] 7   [facility] 4   [facility] 1   Total 13   Note: Visits indicate total known visits.      Recent Emergency Department Visit Summary  Showing 10 most recent visits out of 13 in the past 12 months  Date Facility [facility] Type Diagnoses or Chief Complaint   Aug 09, 2019 [facility] [place] [facility]     Jul 31, 2019 [name] [place] [facility]  Chief Complaint: CHEST PAIN/STOMACH PAIN/ BACK PAIN    Jul 12, 2019 [facility]. [place] [facility]      1. Post-op Problem      1. Unspecified abdominal pain      Mar 09, 2019 [facility] [place]. [place] [facility]      1. Duodenal ulcer, unspecified as acute or chronic, without hemo      2. Abdominal Pain      Feb 18, 2019 [name] [place] [facility]  Chief Complaint: CHEST PAIN,STOMACH PAIN, BACK PAIN    Feb 05, 2019 [facility] [place] [facility]      Other long term (current) drug therapy      Peptic ulcer, site unspecified, unspecified as acute or chron      Allergy status to analgesic agent status Personal history of other diseases of the digestive system      Colostomy status      Epigastric pain      Gastro-esophageal reflux disease without esophagitis      Chronic obstructive pulmonary disease, unspecified      Jan 14, 2019 [name] [place] [facility]  Chief Complaint: ABD PAIN    Dec 31, 2018 [facility] [place] [facility]      Unspecified abdominal pain      Peptic ulcer, site unspecified, unspecified as acute or chron      Chronic obstructive pulmonary disease, unspecified      Chest pain, unspecified      Dec 03, 2018 [facility]. [place] [facility]      1. Epigastric pain      2. Chest Pain      3. 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 [place] [facility]      2. 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 Peptic ulcer, site unspecified, unspecified as acute or chron      3. Epigastric pain      4. Abdominal Pain      Feb 18, 2019 [name] [place] Tele[facility]      Anemia, unspecified      Hyperlipidemia, unspecified      Chronic pain syndrome      Acute ischemic heart disease, unspecified      Other long term (current) drug therapy      Nicotine dependence, cigarettes, uncomplicated      Other specified disorders of bladder      Colostomy status      Atherosclerosis of aorta      Bipolar disorder, unspecified Nov 20, 2018 [facility] [place]. [place] [facility]      Other chest pain      Colostomy status      Gastro-esophageal reflux disease without esophagitis      Major depressive disorder, single episode, unspecified      Other specified counseling      Chronic obstructive pulmonary disease, unspecified      Nicotine dependence, cigarettes, uncomplicated      Gastric ulcer, unspecified as acute or chronic, without hemor      Allergy status to other drugs, medicaments and biological sub      Anxiety disorder, unspecified      Sep 22, 2018 [facility]. [place] [facility]      Essential (primary) hypertension      Major depressive disorder, single episode, unspecified      Generalized anxiety disorder      Emphysema, unspecified      Fibromyalgia      Unspecified asthma, uncomplicated      Pure hypercholesterolemia, unspecified      Nicotine dependence, cigarettes, uncomplicated      Gastro-esophageal reflux disease without esophagitis      Other chest pain          Care Team  [facility] Phone Fax Service Dates   Coy Saunas , M.D. Family Medicine [phone] Nov 20, 2018 - Current    Brain Hilts , M.D. Family Medicine   Current    [facility] Primary Care [phone] Nov 20, 2018 - Current    [facility] Primary Care   Current    [facility] Primary Care   Current      Collective Portal  This patient has registered at the [facility] Emergency Department   For more information visit: [email or web address]   PLEASE NOTE:     1.   Any care recommendations and other clinical information are provided as guidelines or for historical purposes only, and providers should exercise their own clinical judgment when providing care.    2.  You may only use this information for purposes of treatment, payment or health care operations activities, and subject to the limitations of applicable [employer].    3.   You should consult directly with the organization that provided a care guideline or other clinical history with any questions about additional information or accuracy or completeness of information provided.    ? 2020 [employer] - https://craig.com/

## 2019-08-09 NOTE — ED Notes
ultrasound guided IV requested at this time.

## 2019-08-09 NOTE — ED Notes
Patient transported back to room In no acute distress.

## 2019-08-09 NOTE — ED Notes
Referred by Dr Mosie Lukes re: GI. Spoke with patient at bedside, checked ID and confirmed demographics. Verbalized understanding re: referral process based on insurance coverage. Patient given CM contact info for further assistance.     Called out to [facility], spoke with Freda Munro. Clinicals faxed to Discharge Planner fax # [phone] to assist in auth and scheduling of OPT GI. Email sent out to facilitate scheduling of MD appointment to [facility] 913-[email or web address] Referral in progress.    MD Referral      Patient Name: Donna, Gross   MRN: [id number]   Request: GI   [place]   Diagnosis: Blood In Stool and Achalasia   Contact Number: [phone]   Time Frame: 3 days    Note: Pt states, recent [place] admission   related to GI issues

## 2019-08-11 ENCOUNTER — Telehealth: Payer: PRIVATE HEALTH INSURANCE

## 2019-08-11 NOTE — Telephone Encounter
PDL Call to Practice    Reason for Call: Patient is asking to speak with doctor or doctor's nurse regarding her symptoms.  MD: Bridgett Larsson    Appointment Related?  []  Yes  [x]  No     If yes; n/a  Date:n/a  Time: n/a    Call warm transferred to PDL: [x]  Yes  []  No    Call Received by Practice Representative: Peter Congo

## 2019-08-11 NOTE — Telephone Encounter
Unable to eat - immediate emesis - burping then emesis. With liquid and food - constantly nauseated - only able to tolerate water - taking Protonix BID and she is also having bright red  bloody bowel movements 2x/day -     The patient is losing weight, and very weak. Advised her to follow up with her GI doctor for work up and potential EGD colonoscopy    She denies hemorrhoids or constipation - Will discuss with Dr. Bridgett Larsson

## 2019-08-17 ENCOUNTER — Telehealth: Payer: PRIVATE HEALTH INSURANCE

## 2019-08-17 NOTE — Telephone Encounter
Donna Gross,     RE: Donna Gross, Donna Gross [id number]    [facility] ~ PMD on 08/16/19 @ 3:15 pm. [ previously scheduled ]   [address], [place] [address]  [phone]    Casimiro Needle. [facility] Office   P: [phone]   F: [phone]   ------------------------------------------------------------------------------------------------------------------  From: Gholson, Ma Linda [email or web address]     MD Referral      Patient Name: Donna Gross, Donna Gross   MRN: [id number]   Request: GI   [place]   Diagnosis: Blood In Stool and Achalasia   Contact Number: [phone]   Time Frame: 3 days    Note: Pt states, recent [place] admission   related to GI issues      Thank you!       Kind Regards,  [place], [place]  Team Lead Emergency Dept Senior Case Manager  Department of Care Coordination and Clinical Social Work  [facility]  [place] [address]     Phone        [phone]   Email        [phone]  Pager       [phone]

## 2019-10-02 IMAGING — RF FL ESOPHAGUS BARIUM SWALLOW
8 series · 15 of 24 positions shown · non-contrast
Comparison: none

Reflux,chokes on solids and liquids,dysphagia,pharyngo-esophageal phase,ENT Dr Ferienhaus ordered barium swallow,negative endoscopy and colonoscopy 4 yrs ago
Exam:Barium swallow.
REASON FOR EXAM: Dysphagia.

[Series 1: esophogr 1 · 2 of 13 frames shown]
[frame 1/13]
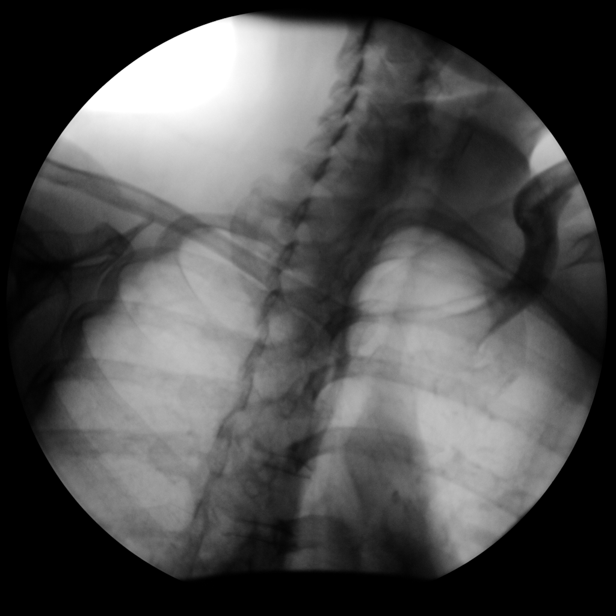
[frame 7/13]
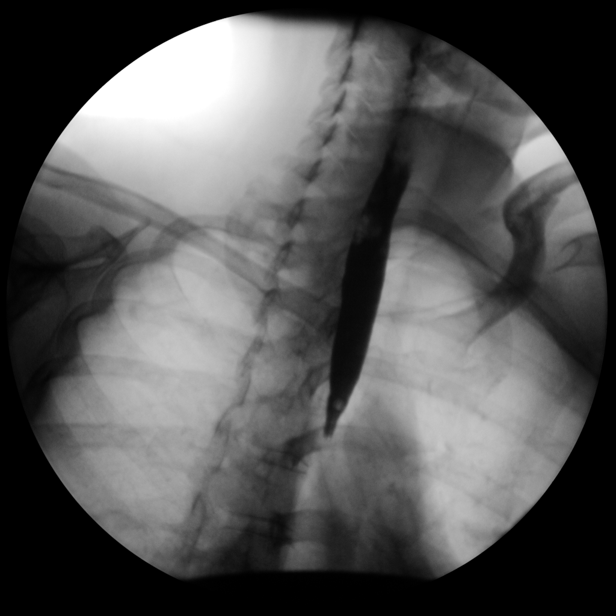

[Series 2: esophogr 2 · 2 of 13 frames shown]
[frame 7/13]
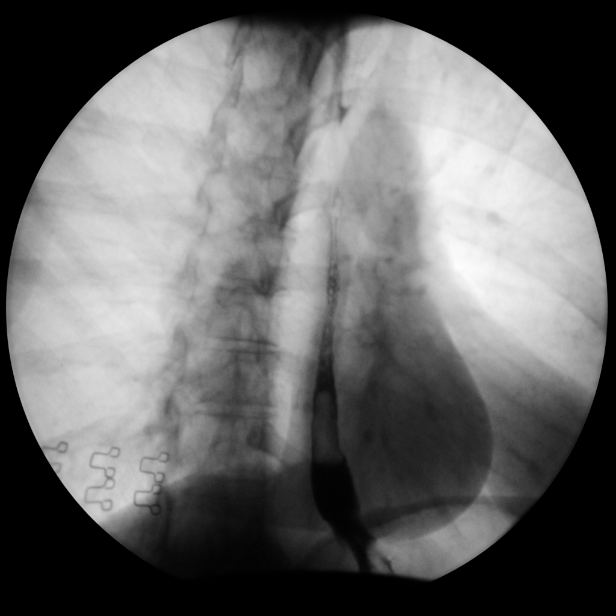
[frame 12/13]
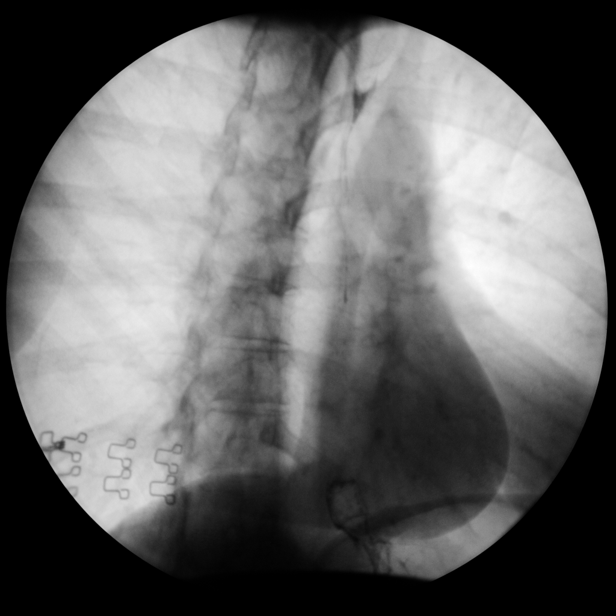

[Series 3: esophogr 3 · 1 of 2 frames shown]
[frame 2/2]
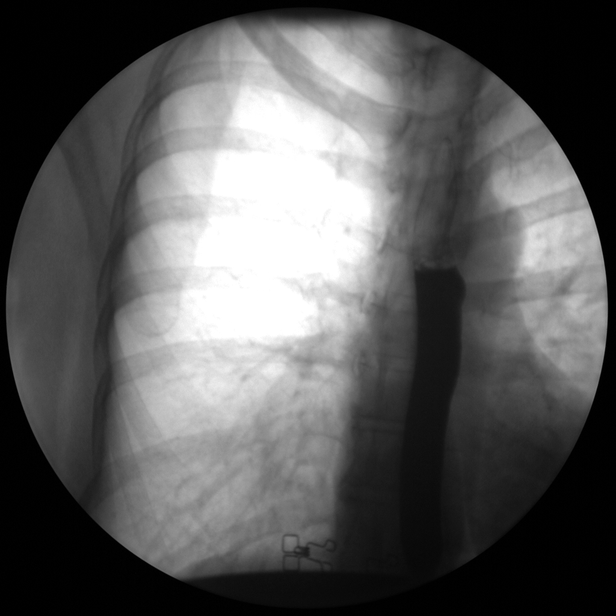

[Series 4: esophogr 4 · 2 acquisitions, 2 frames shown]
[im 1/2]
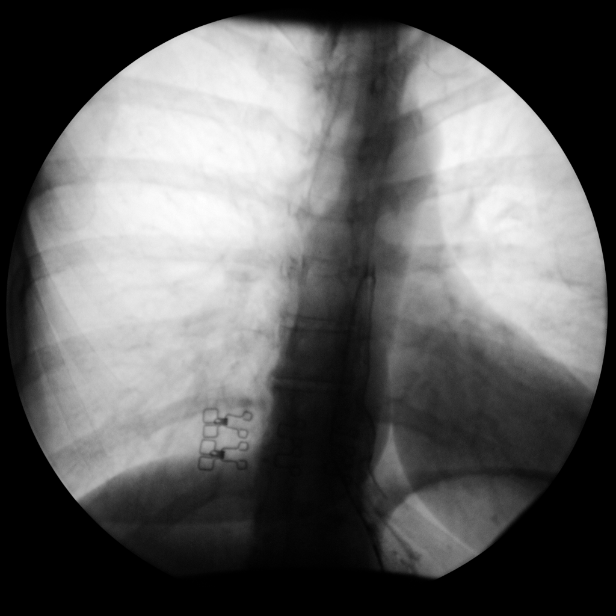
[im 2/2]
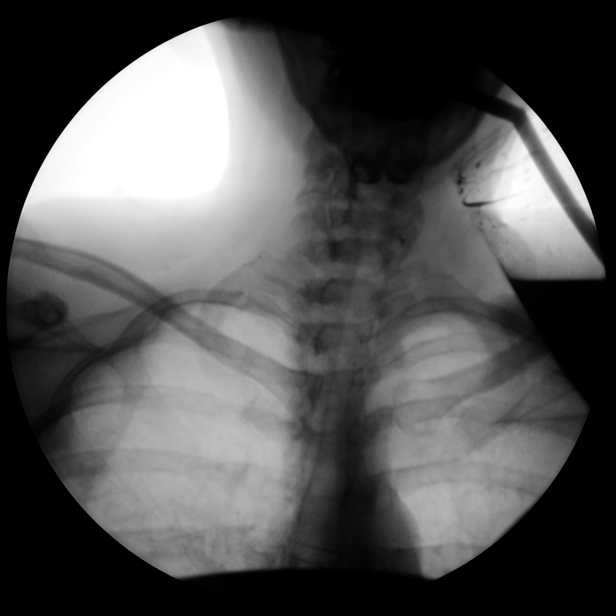

[Series 5: esophogr 5 · 3 of 8 frames shown]
[frame 2/8]
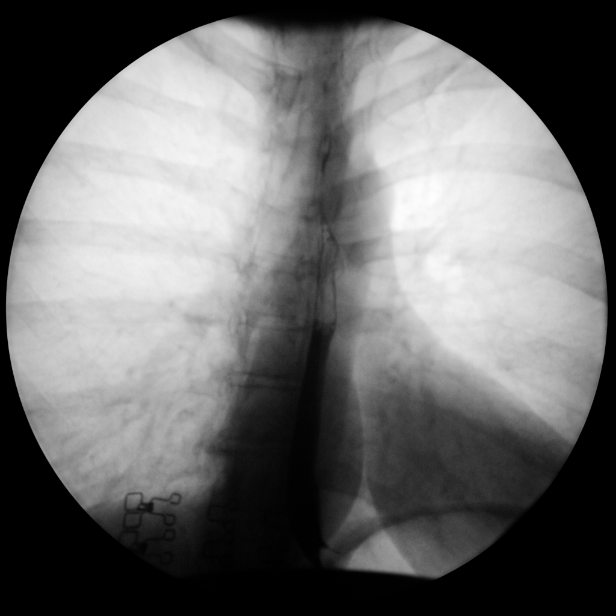
[frame 4/8]
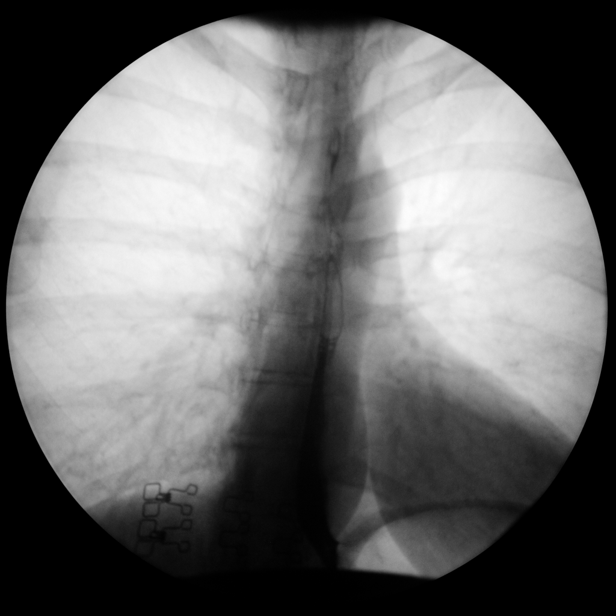
[frame 7/8]
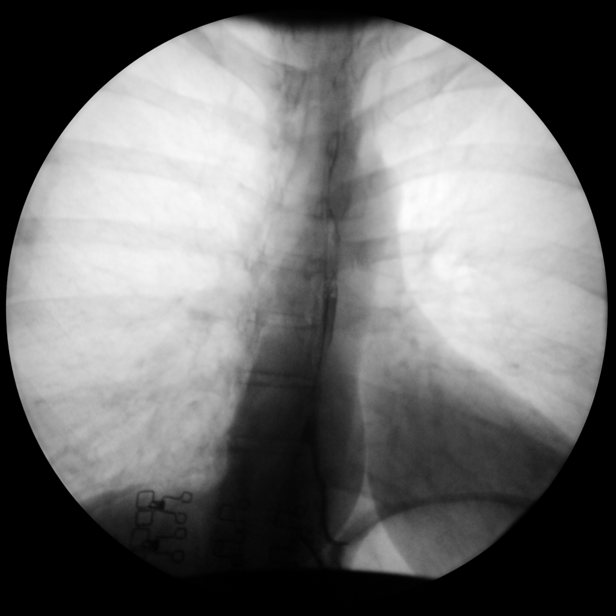

[Series 6: esophogr 6 · 1 of 12 frames shown]
[frame 2/12]
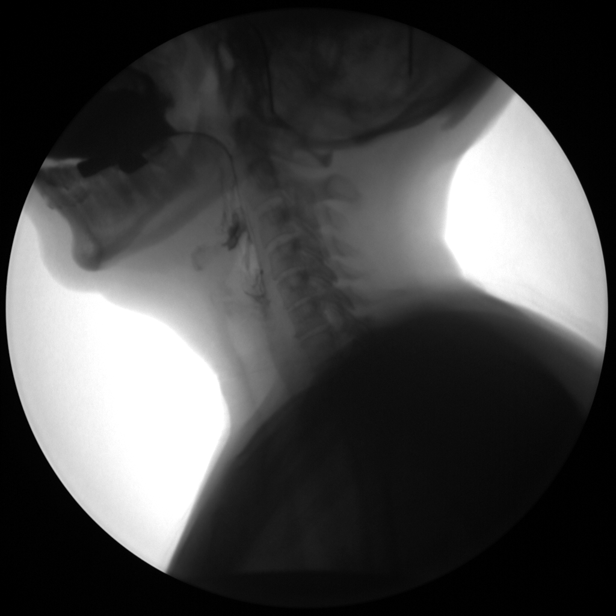

[Series 7: esophogr 7 · 2 of 3 frames shown]
[frame 1/3]
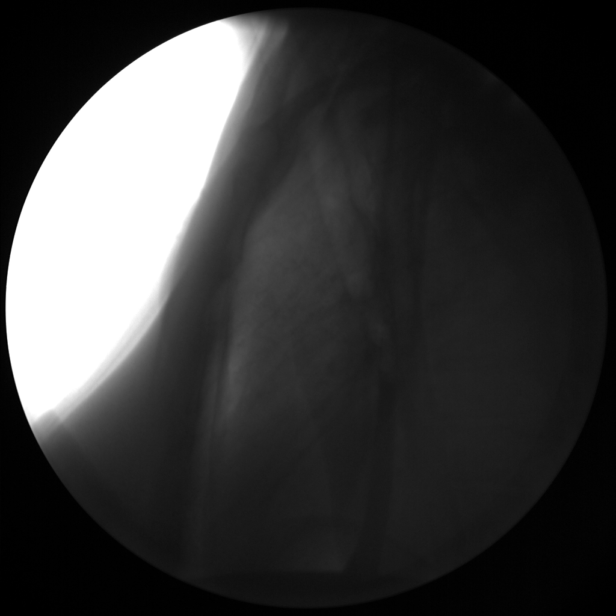
[frame 3/3]
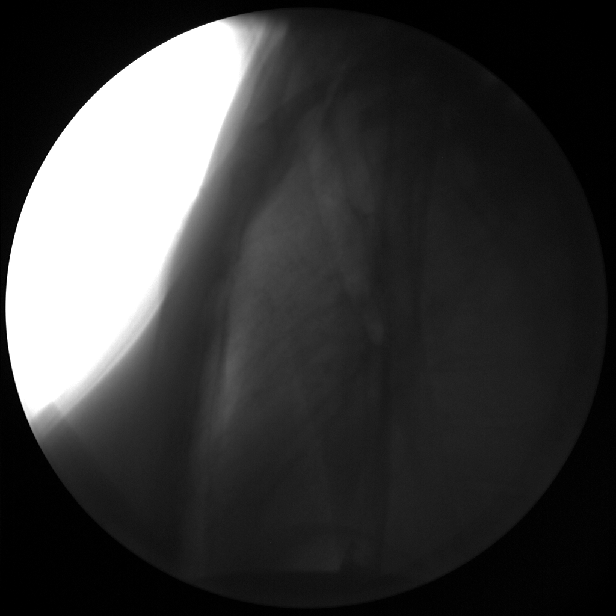

[Series 8: esophogr 8 · 2 of 16 frames shown]
[frame 8/16]
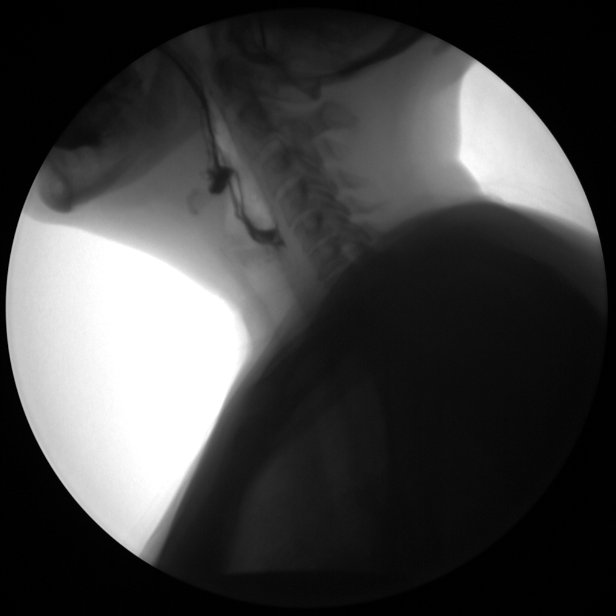
[frame 14/16]
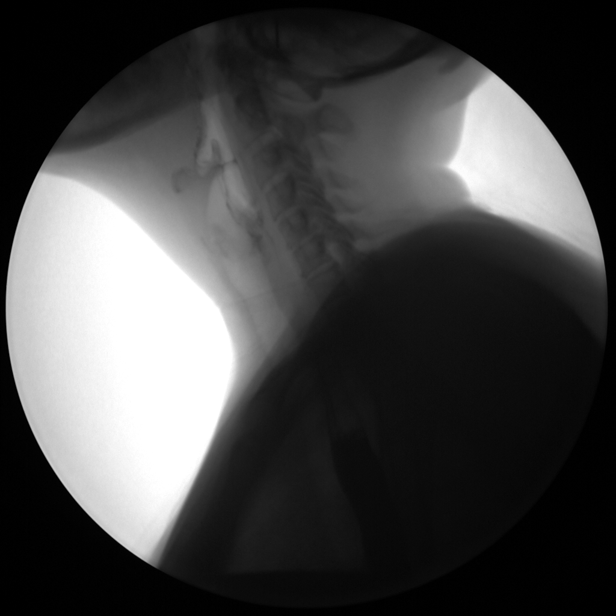

[15 of 24 positions shown; findings below may reference images not displayed]

FINDINGS: An air contrast barium swallow was performed. There is normal esophageal peristalsis. No mucosal or submucosal mass is identified. There is normal ending through the gastroesophageal junction. No esophageal diverticulum is noted.
IMPRESSION: Normal air-contrast barium swallow
Location:1
Is the patient pregnant?
No

## 2019-10-17 ENCOUNTER — Telehealth: Payer: PRIVATE HEALTH INSURANCE

## 2019-10-17 NOTE — Telephone Encounter
[facility] Flu Sx Appointment Message    Patient has confirmed having the flu like symptoms:       ? Cough []       ? Respiratory issues []      ? Fatigue []      ? Fever or chills []      ? Muscle or body aches []      ? Headache []      ? New loss of taste or smell []      ? Sore throat []      ? Congestion []      ? Nausea or Vomiting [x]      ? Diarrhea [x]        Message: Pt is requesting to be seen asap. Pt has been experiencing the following symptom since surgery back in July; pain when eating, nausea, vomiting, diarrhea and bloody stool. Pt advises she has been tested for COVID-19 and has tested negative.          Routed to practice admin pool

## 2019-10-17 NOTE — Telephone Encounter
Good Morning,  Call Back Request    MD:  Bridgett Larsson    Reason for call back: Constance Holster from pt's PCP office is requesting for a CPT code for a follow up apt with the dr so she can request a referral for pt to see dr. Please assist and advise.    *Please reach out to her ASAP since she is working on the referral right now.    Thank you.  CBN: [phone] ext: [phone]    Any Symptoms:  []  Yes  [x]  No      ? If yes, what symptoms are you experiencing:    o Duration of symptoms (how long):    o Have you taken medication for symptoms (OTC or Rx):      Patient or caller has been notified of the 24-48 hour turnaround time.

## 2019-10-23 NOTE — Telephone Encounter
PDL Call to Practice    Reason for Call: pt calling about scheduling a return visit, due to symptoms since sx in July.   MD: Bridgett Larsson    Appointment Related?  []  Yes  [x]  No     If yes;  Date:  Time:    Call warm transferred to PDL: []  Yes  [x]  No    Call Received by Practice Representative: Vicky, she info autho is in system. Also to message office for NP to review & schedule.    Thank you

## 2019-10-24 NOTE — Telephone Encounter
Hi Vicky,    There are her symptoms from surgery - she has been having nausea and diarrhea - we can bring her in :) Thank you   Reply by: Lindell Noe

## 2019-10-31 ENCOUNTER — Ambulatory Visit: Payer: PRIVATE HEALTH INSURANCE

## 2019-11-14 ENCOUNTER — Ambulatory Visit: Payer: PRIVATE HEALTH INSURANCE

## 2019-11-24 ENCOUNTER — Ambulatory Visit: Payer: PRIVATE HEALTH INSURANCE

## 2019-11-24 DIAGNOSIS — R1310 Dysphagia, unspecified: Secondary | ICD-10-CM

## 2019-11-25 NOTE — Progress Notes
DATE OF SERVICE:  11/24/2019     Donna Gross is a very pleasant 53 year old woman. She is very well known to me. 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 She had a prior exploratory laparotomy and Phillip Heal patch, prior laparotomy for colonic fistula, prior diverting ileostomy, prior abdominal wall component separation. We found a posterior perforated duodenal ulcer with perforation into the head of the pancreas. We performed a partial gastrectomy, Billroth-II reconstruction. We Kocherized the duodenum and hepatic flexure. We performed extensive adhesiolysis after performing a vagotomy and antrectomy. She recovered from this operation. She, over the past few months, thought, has dealt with her issues of chronic pain. She, however, has had also some dysphagia. She reports that she is losing weight and although food goes down and she has bowel movements and she does not have any significant digestive issues or pain, she feels like food hangs up in her upper esophagus in the upper esophageal sphincter pharyngeal region.    PHYSICAL EXAMINATION:  Abdomen: Otherwise benign. General: She is otherwise well appearing but is under underweight with a BMI of 20.    PLAN:  I ordered miss mesh and upper GI fluoroscopy, as well as esophageal manometry. This will help [place] to see if there is any underlying motility issue that can account for some of her symptoms. Once these studies are completed, she will follow up with me.      Monia Sabal Bridgett Larsson, MD [phone])        DCC/MODL CONF#: [id number]  D: 11/24/2019 12:38:11 T: 11/25/2019 04:08:49 DOCUMENT: [id number]

## 2019-12-13 IMAGING — DX XR CHEST 2 VIEWS
1 series · 2 of 2 positions shown · non-contrast
Comparison: none

Dyspnea,started methotrexate 5 wks ago
TWO VIEW CHEST.
INDICATION: dyspnea

[Series 6934: PA · U · 0.19mm/px · 2 of 2 slices shown]
[im 1/2]
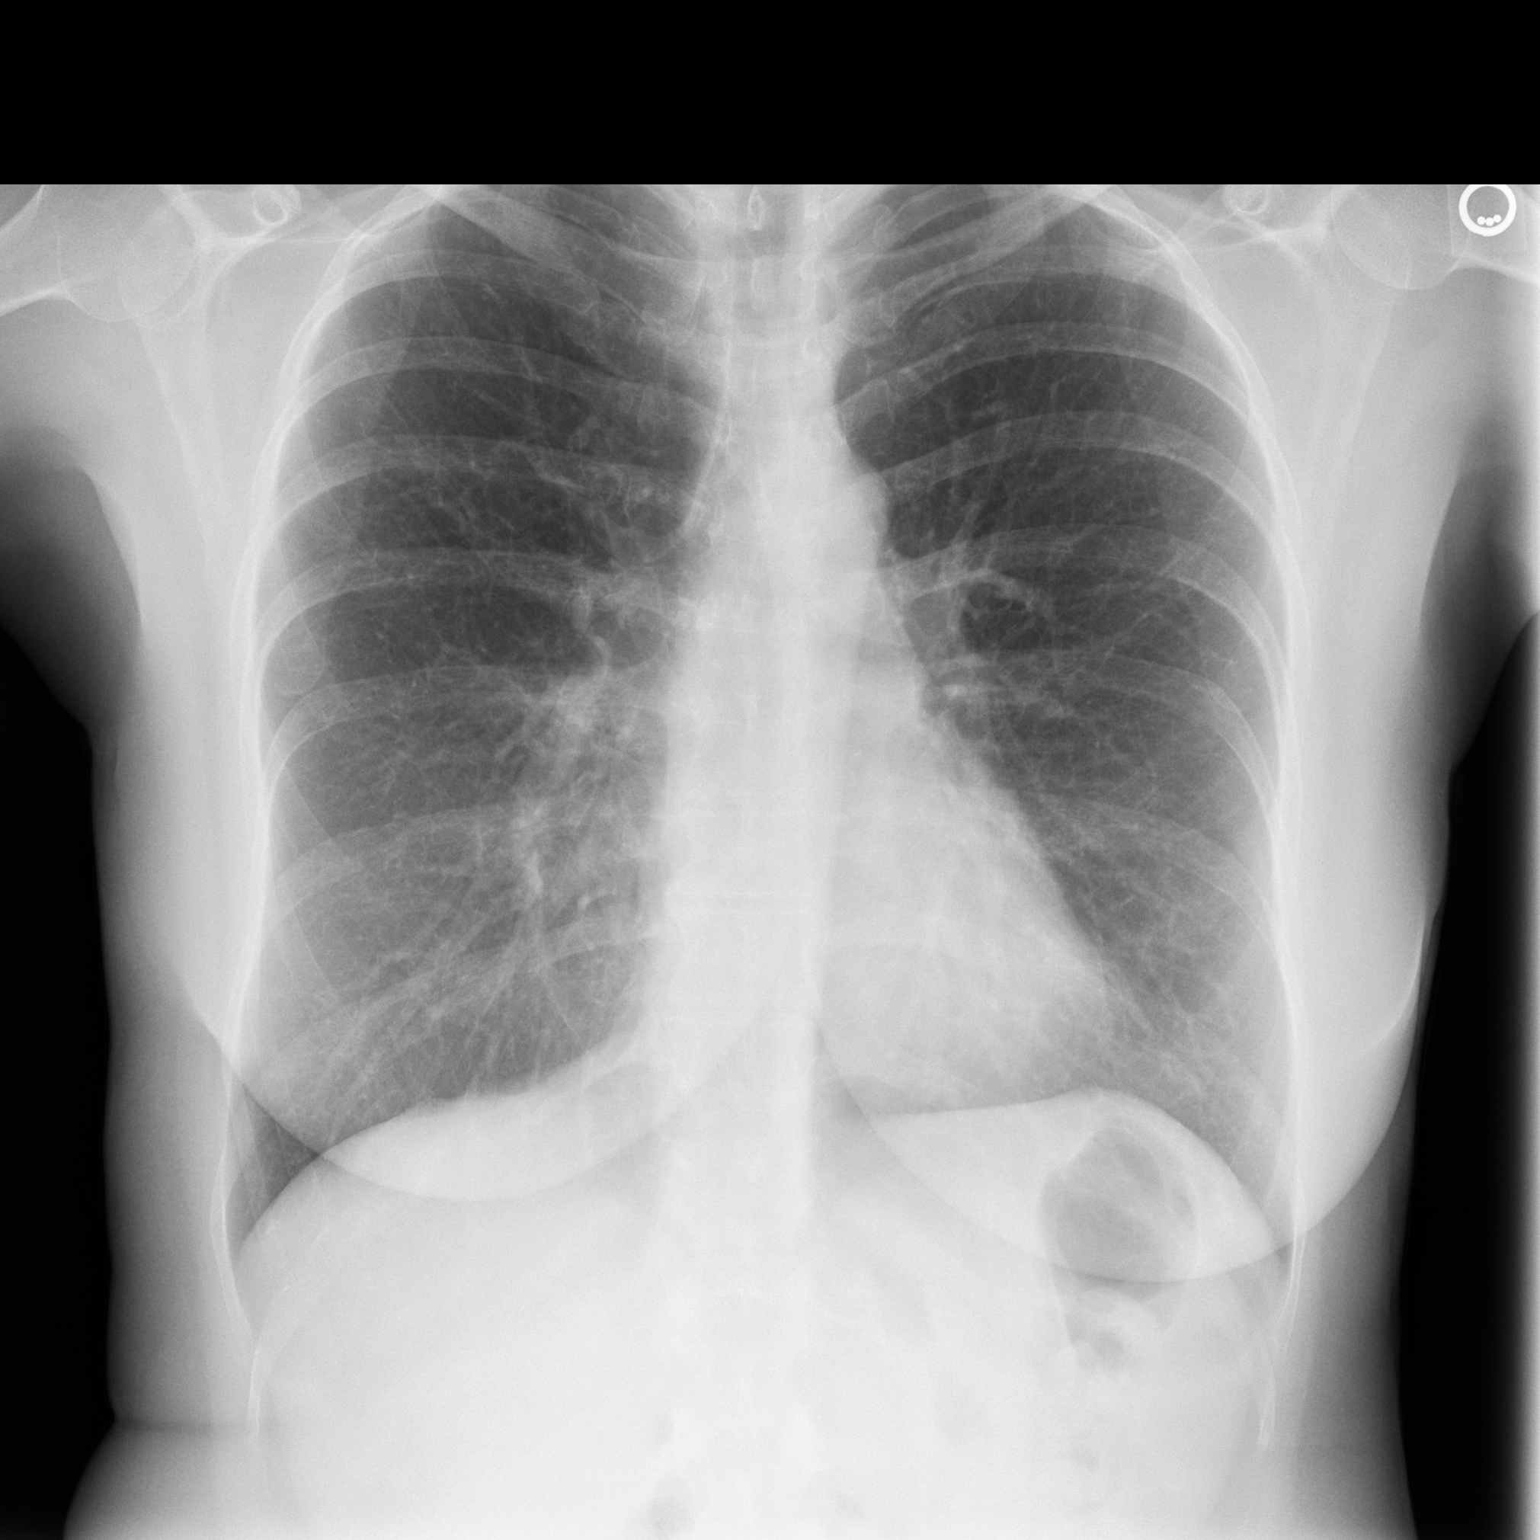
[im 2/2]
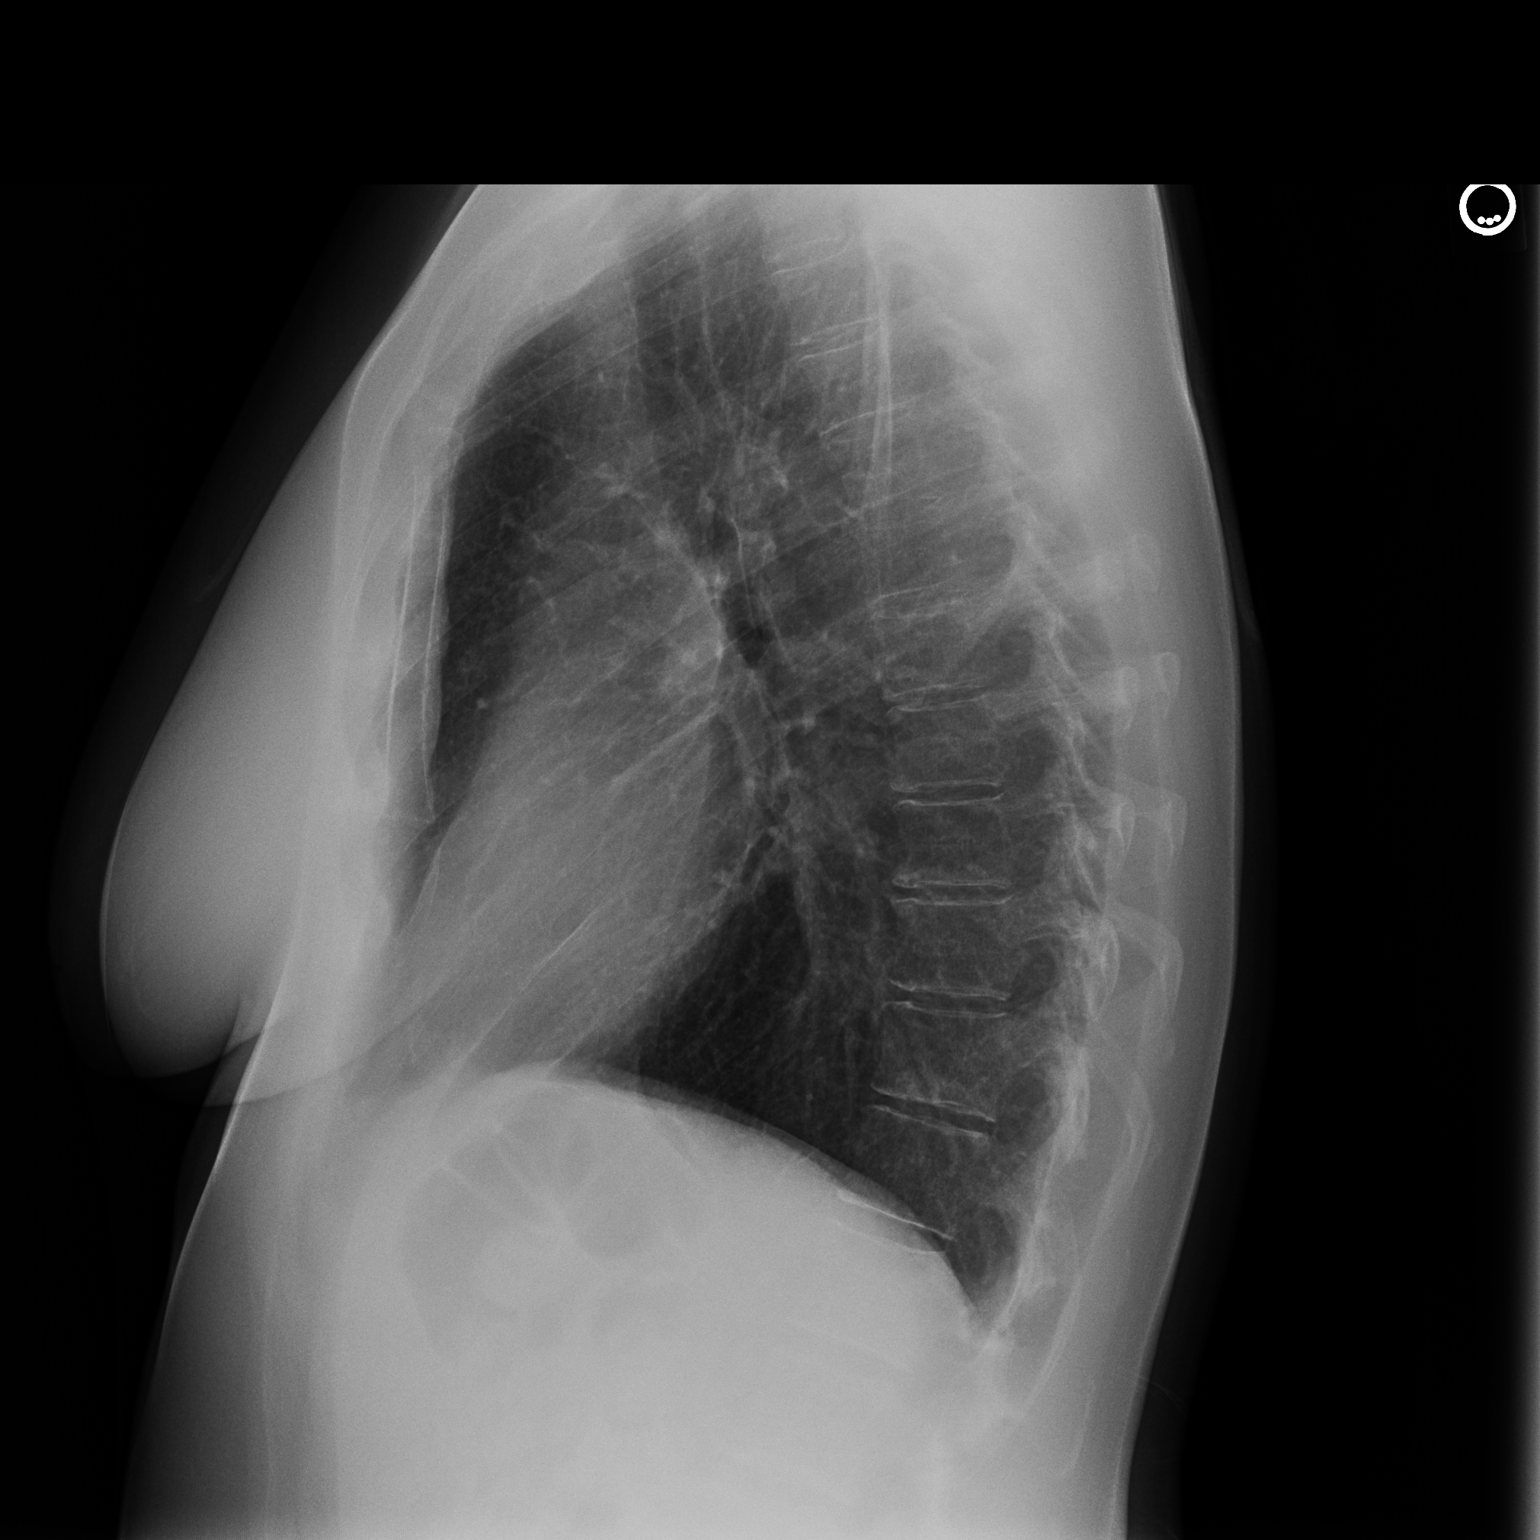

[2 of 2 positions shown; findings below may reference images not displayed]

FINDINGS: Frontal and lateral chest radiographs are compared to prior study 11/30/2017.
Heart size and mediastinal contours are stable.  Lungs and pleural spaces remain clear.  No pneumothorax.  Osseous structures are intact.
IMPRESSION: 
IMPRESSION: No acute cardiopulmonary process or significant interval change from previous study.
Location 12
Is the patient pregnant?
No

## 2019-12-25 ENCOUNTER — Telehealth: Payer: PRIVATE HEALTH INSURANCE

## 2019-12-25 DIAGNOSIS — Z01812 Encounter for preprocedural laboratory examination: Secondary | ICD-10-CM

## 2020-01-05 ENCOUNTER — Ambulatory Visit: Payer: PRIVATE HEALTH INSURANCE

## 2020-01-09 ENCOUNTER — Ambulatory Visit: Payer: PRIVATE HEALTH INSURANCE

## 2020-01-19 ENCOUNTER — Telehealth: Payer: PRIVATE HEALTH INSURANCE

## 2020-01-19 NOTE — Telephone Encounter
Message to Practice/Provider    MD: Prentice Docker C.,MD    Message: PCP office is requesting last progress note to be faxed pt has a follow up with PCP office this upcoming Tuesday 01/23/20.CB# [phone]  FX#1 [phone]  FX # [phone]      Return call is not being requested by the patient or caller.    Patient or caller has been notified of the 24-48 hour processing turnaround time if applicable.

## 2020-01-19 NOTE — Telephone Encounter
faxed

## 2020-01-22 ENCOUNTER — Telehealth: Payer: PRIVATE HEALTH INSURANCE

## 2020-01-22 DIAGNOSIS — Z01818 Encounter for other preprocedural examination: Secondary | ICD-10-CM

## 2020-01-22 NOTE — Telephone Encounter
MOTILITY CONFIRMATIONS    TEST: ESOPHAGEAL MANOMETRY   DOS: 01/30/20 at 9:30 am-Confirmed      [x]  CALLED PATIENT TWO DAYS PRIOR, DATE: 01/22/20  [x]  FEMALE NURSE OK  [x]  ORDER/REFERRAL ATTACHED TO MOTILITY APPOINTMENT & REFERRING MD INDICATED  [x]  VERIFY MOTILITY TEST SCHEDULED MATCH TEST(S) ORDERED  [x]  VERIFY PATIENT HAS BEEN FINANCIALLY CLEARED  [x]  VERIFY COVID TEST SCHEDULED/PENDED/PATIENT CLEARED    [x]  REVIEWED/VERIFIED  PATIENT HAS INSTRUCTIONS-2/1 read instr's & sent via airmail    []  24 pH - if OFF acid suppression, must be off medications as instructed by ordering physician and NPO after midnight   [x]  Esophageal Manometry - NPO after midnight   []  ADULT - Anorectal Manometry (2 saline fleet enemas at least 2 hrs prior to procedure; may have a light breakfast)  []  PEDS - For children under 14 years old - Administer (1) 4.5 oz Fleet mineral oil enema the night before the appointment and (1) mineral oil enema early in the morning on the day of the appointment  []  PEDS - For children 10 years or older  Administer (1) SALINE enema the night before and 1 SALINE enema the morning of the appointment    [x] Appointment notes entered (TEST/COVID CLEAR/COVID QV/INSTR REVIEWED/EMAILED/FINANCIAL CEARANCE

## 2020-01-23 ENCOUNTER — Ambulatory Visit: Payer: PRIVATE HEALTH INSURANCE

## 2020-01-23 ENCOUNTER — Inpatient Hospital Stay: Admit: 2020-01-23 | Payer: PRIVATE HEALTH INSURANCE | Source: Home / Self Care

## 2020-01-23 DIAGNOSIS — R112 Nausea with vomiting, unspecified: Secondary | ICD-10-CM

## 2020-01-23 DIAGNOSIS — E162 Hypoglycemia, unspecified: Secondary | ICD-10-CM

## 2020-01-23 LAB — UA,Microscopic: RBCS: 4 {cells}/uL (ref 0–11)

## 2020-01-23 LAB — Glucose,POC
GLUCOSE,POC: 65 mg/dL (ref 65–99)
GLUCOSE,POC: 140 mg/dL — ABNORMAL HIGH (ref 65–99)

## 2020-01-23 LAB — Amylase: AMYLASE: 117 U/L (ref 31–124)

## 2020-01-23 LAB — Basic Metabolic Panel
CHLORIDE: 109 mmol/L — ABNORMAL HIGH (ref 96–106)
TOTAL CO2: 21 mmol/L (ref 20–30)

## 2020-01-23 LAB — Prothrombin Time Panel: INR: 0.9 s (ref 11.5–14.4)

## 2020-01-23 LAB — Expedited COVID-19 and Influenza A B PCR: INFLUENZA B PCR: NOT DETECTED

## 2020-01-23 LAB — Hepatic Funct Panel: ALANINE AMINOTRANSFERASE: 18 U/L (ref 8–64)

## 2020-01-23 LAB — Differential Automated: ABSOLUTE BASO COUNT: 0.02 10*3/uL (ref 0.00–0.10)

## 2020-01-23 LAB — Lipase: LIPASE: 55 U/L (ref 9–63)

## 2020-01-23 LAB — Extra Light Green Top

## 2020-01-23 LAB — UA,Dipstick

## 2020-01-23 LAB — CBC: RED BLOOD CELL COUNT: 3.73 x10E6/uL — ABNORMAL LOW (ref 3.96–5.09)

## 2020-01-23 MED ADMIN — ONDANSETRON HCL 4 MG/2ML IJ SOLN: 4 mg | INTRAVENOUS | @ 19:00:00 | Stop: 2020-01-23 | NDC 60505613005

## 2020-01-23 MED ADMIN — DEXTROSE-NACL 5-0.9 % IV SOLN: 100 mL/h | INTRAVENOUS | @ 23:00:00 | Stop: 2020-02-22 | NDC 00338008904

## 2020-01-23 MED ADMIN — DIPHENHYDRAMINE HCL 50 MG/ML IJ SOLN: 25 mg | INTRAVENOUS | @ 17:00:00 | Stop: 2020-01-23 | NDC 63323066401

## 2020-01-23 MED ADMIN — PANTOPRAZOLE SODIUM 40 MG IV SOLR: 40 mg | INTRAVENOUS | @ 16:00:00 | Stop: 2020-01-23 | NDC 00008092355

## 2020-01-23 MED ADMIN — MORPHINE SULFATE (PF) 4 MG/ML IV SOLN: 4 mg | INTRAVENOUS | @ 16:00:00 | Stop: 2020-01-23 | NDC 00641612525

## 2020-01-23 MED ADMIN — ONDANSETRON HCL 4 MG/2ML IJ SOLN: 4 mg | INTRAVENOUS | @ 23:00:00 | Stop: 2020-02-22 | NDC 60505613005

## 2020-01-23 MED ADMIN — SODIUM CHLORIDE 0.9 % IV BOLUS: 1000 mL | INTRAVENOUS | @ 16:00:00 | Stop: 2020-01-23 | NDC 00338004904

## 2020-01-23 MED ADMIN — MORPHINE SULFATE (PF) 2 MG/ML IV SOLN: 1 mg | INTRAVENOUS | @ 23:00:00 | Stop: 2020-01-24 | NDC 00409189001

## 2020-01-23 MED ADMIN — DIPHENHYDRAMINE HCL 25 MG PO CAPS: 50 mg | ORAL | Stop: 2020-02-22 | NDC 00904530661

## 2020-01-23 MED ADMIN — ONDANSETRON HCL 4 MG/2ML IJ SOLN: 4 mg | INTRAVENOUS | @ 16:00:00 | Stop: 2020-01-23 | NDC 60505613005

## 2020-01-23 MED ADMIN — HYDROMORPHONE HCL 1 MG/ML IJ SOLN: 1 mg | INTRAVENOUS | @ 19:00:00 | Stop: 2020-01-23 | NDC 00409128331

## 2020-01-23 NOTE — ED Notes
Pt. Blood sugar noted to bed 40. Pt. Awake, alert and oriented. Pt. Reports she has not been able to eat because it feels like food gets stuck in her throat. Pt. Reports she is able to tolerate and swallow liquids. Will repeat sugar after ingestion of apple juice and continue to monitor.

## 2020-01-23 NOTE — ED Notes
COLLECTIVE?NOTIFICATION?01/23/2020 07:53?Donna Gross?MRN: [id number]    [name]:   QMV:[id number]  Account [id number]  Billing Account [id number]      Criteria Met      2 Visits in 30 Days    History of Sepsis Dx    6 Visits in 180 Days    Security and Safety  No recent Security Events currently on file    ED Care Guidelines  There are currently no ED Care Guidelines for this patient. Please check your facility's medical records system.    Flags      History of Sepsis - Patient has received a diagnosis of Sepsis from an acute or post-acute setting. Apply appropriate clinical planning practices; to learn more visit [email or web address] / Attributed By: [facility] / Attributed On: 12/22/2018           E.D. Visit Count (12 mo.)  Facility Visits   Donna Gross [facility] 8   [facility] 4   [address] [place] 1   Total 13   Note: Visits indicate total known visits.     Recent Emergency Department Visit Summary  Showing 10 most recent visits out of 13 in the past 12 months  Date Facility [facility] Type Diagnoses or Chief Complaint   Jan 23, 2020 [facility] [place] [facility]     Jan 20, 2020 [facility] [place] [place] [facility]  Chief Complaint: chest pain, and stomach pain, back pain    Dec 31, 2019 [name]  Chief Complaint: ABD PAIN    Dec 10, 2019 [facility] - [facility] Donna Gross. [place] [facility]      Major depressive disorder, single episode, unspecified      Nicotine dependence, other tobacco product, uncomplicated      Other long term (current) drug therapy      Acute gastritis without bleeding      Anemia, unspecified      Generalized abdominal pain      Chronic obstructive pulmonary disease, unspecified      Allergy status to analgesic agent      Oct 08, 2019 [facility] - [facility] [place] [place] [facility]      Nausea with vomiting, unspecified      Essential (primary) hypertension Chronic obstructive pulmonary disease, unspecified      Epigastric pain      Other long term (current) drug therapy      Allergy status to analgesic agent      Nicotine dependence, other tobacco product, uncomplicated      Sep 24, 2019 [facility] - [facility] [place] [place] [facility]  Chief Complaint: CHEST PAIN / BACK PAIN    Aug 12, 2019 Donna - [facility] Donna Gross. [place] [facility]      Chronic obstructive pulmonary disease, unspecified      Allergy status to analgesic agent      Epigastric pain      Other specified postprocedural states      Aug 09, 2019 [facility] [place] [facility]      1. Unspecified abdominal pain      1. Blood In Stool      2. Achalasia      2. Melena      Jul 31, 2019 [facility] - [facility]      Nausea with vomiting, unspecified      Other specified disorders of bladder  Atherosclerosis of aorta      Elevated blood-pressure reading, without diagnosis of hypertension      Other chest pain      Generalized abdominal pain      Diarrhea, unspecified      Jul 12, 2019 [facility] [place] [place] [facility]      1. Post-op Problem      1. Unspecified abdominal pain          Recent Inpatient Visit Summary  Date Facility [facility] Type Diagnoses or Chief Complaint   Jan 20, 2020 [facility] - [facility] Donna [facility]. [place] Tele[facility]      Procedure and treatment not carried out because of patient's decision for other reasons      Long term (current) use of opiate analgesic      Major depressive disorder, single episode, unspecified      Acute pancreatitis without necrosis or infection, unspecified      Long term (current) use of aspirin      Other long term (current) drug therapy      Gastro-esophageal reflux disease without esophagitis      Family history of ischemic heart disease and other diseases of the circulatory system      Allergy status to analgesic agent      Chronic obstructive pulmonary disease, unspecified      Sep 24, 2019 Donna - [facility] [place]. Donna Gross Telemetry Anemia, unspecified      Chest pain, unspecified      Spondylolisthesis, lumbosacral region      Left lower quadrant pain      Peptic ulcer, site unspecified, unspecified as acute or chronic, without hemorrhage or perforation      Other disorders of electrolyte and fluid balance, not elsewhere classified      Hyperlipidemia, unspecified      Other chest pain      Essential (primary) hypertension      Chronic obstructive pulmonary disease, unspecified      Jun 20, 2019 [facility] [place] [facility]      2. 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 Peptic ulcer, site unspecified, unspecified as acute or chronic, without hemorrhage or perforation      3. Epigastric pain      4. Abdominal Pain      Feb 18, 2019 Donna - [facility] Donna Gross. [place] [facility]      Anemia, unspecified      Hyperlipidemia, unspecified      Chronic pain syndrome      Acute ischemic heart disease, unspecified      Other long term (current) drug therapy      Nicotine dependence, cigarettes, uncomplicated      Other specified disorders of bladder      Colostomy status      Atherosclerosis of aorta      Bipolar disorder, unspecified          Care Team  [facility] Phone Fax Service Dates   BUTLER, Josem Kaufmann, M.D. Family Medicine [phone] Nov 20, 2018 - Current    PEREZ, [name], M.D. Family Medicine   Current    [facility]  [phone] Nov 20, 2018 - Current    [facility] Primary Care   Current     [facility] Primary Care   Current    [facility] Primary Care [phone] Aug 21, 2018 - Current      Collect[facility] Portal  This patient has registered at the [facility] Emergency Department   For more information visit: [email or web address]   PLEASE NOTE: 1.   Any care recommendations and other clinical information are provided as guidelines or for historical purposes only, and providers should exercise their own clinical judgment when providing care.    2.   You may only use this information for purposes of treatment, payment or health care operations activities, and subject to the limitations of applicable [employer] Policies.    3.   You should consult directly with the organization that provided a care guideline or other clinical history with any questions about additional information or accuracy or completeness of information provided.    ? 2021 [place], [employer]. - [email or web address]

## 2020-01-23 NOTE — H&P
PATIENT:Donna Gross  ZOX:[id number]  DOB:01-20-1966  DATE OF SERVICE:01/23/2020    CC: Abdominal pain, regurgitation, dysphagia    HPI:  Donna Gross is a 54 y.o. female with a history of refractory duodenal ulcer. Donna Gross had a prior exploratory laparotomy and Cheree Ditto patch, prior laparotomy for colonic fistula, prior diverting ileostomy, prior abdominal wall component separation. We found a posterior perforated duodenal ulcer with perforation into the head of the pancreas. We performed a partial gastrectomy, Billroth-II reconstruction. We Kocherized the duodenum and hepatic flexure. We performed extensive adhesiolysis after performing a vagotomy and antrectomy. Donna Gross recovered from this operation. Donna Gross, over the past few months, thought, has dealt with her issues of chronic pain. Donna Gross, however, has had also some dysphagia. Donna Gross reports that Donna Gross is losing weight and although food goes down and Donna Gross has bowel movements and Donna Gross does not have any significant digestive issues or pain, Donna Gross feels like food hangs up in her upper esophagus in the upper esophageal sphincter pharyngeal region.     Past Medical History:  Past Medical History:   Diagnosis Date   ? Anxiety    ? Depression    ? Emphysema, unspecified (HCC/RAF)    ? Fibromyalgia    ? Gastritis    ? GERD (gastroesophageal reflux disease)    ? Pancreatitis         Past Surgical History:  Past Surgical History:   Procedure Laterality Date   ? ABDOMINAL SURGERY     ? COLON SURGERY          Medications:    Current Facility-Administered Medications:   ?  acetaminophen IV inj 1,000 mg, 1,000 mg, Intravenous, Q8H, Belachew, Wossen, MD  ?  dextrose 5%/0.9% NaCl IV soln, 100 mL/hr, Intravenous, Continuous, Belachew, Wossen, MD, Last Rate: 100 mL/hr at 01/23/20 1436, 100 mL/hr at 01/23/20 1436  ?  diphenhydrAMINE cap 50 mg, 50 mg, Oral, Q6H PRN, Belachew, Wossen, MD, 50 mg at 01/23/20 1552 ?  morphine PF 2 mg/mL inj 1 mg, 1 mg, IV Push, Q4H PRN, Bridgette Habermann, MD, 1 mg at 01/23/20 1437  ?  ondansetron ODT tab disolv 4 mg, 4 mg, Oral, Q6H PRN **OR** ondansetron 4 mg/2 mL inj 4 mg, 4 mg, Intravenous, Q6H PRN, Belachew, Wossen, MD, 4 mg at 01/23/20 1437    Current Outpatient Medications:   ?  albuterol (2.5 mg/32mL) 0.083% nebulizer solution, Take 2.5 mg by nebulization every eight (8) hours as needed (SOB) ., Disp: , Rfl:   ?  albuterol 90 mcg/act inhaler, Inhale 2 puffs every eight (8) hours as needed (SOB)., Disp: , Rfl:   ?  aluminum-magnesium hydroxide-simethicone (ALMACONE) 200-200-20 mg/5 mL suspension, Take 5 mLs by mouth three (3) times daily., Disp: , Rfl:   ?  aluminum-magnesium hydroxide-simethicone 400-400-40 mg/5 mL suspension, Take 10 mLs by mouth every four (4) hours as needed., Disp: 355 mL, Rfl: 3  ?  clobetasol 0.05% cream, Apply topically three (3) times daily as needed (rash)., Disp: , Rfl:   ?  cyclobenzaprine 10 mg tablet, Take 10 mg by mouth two (2) times daily as needed for Muscle spasms., Disp: , Rfl:   ?  dicyclomine 20 mg tablet, Take 0.5 tablets (10 mg total) by mouth two (2) times daily., Disp: 10 tablet, Rfl: 0  ?  fluticasone-salmeterol (WIXELA INHUB) 250-50 mcg/dose diskus, Inhale 1 puff two (2) times daily as needed (SOB)., Disp: , Rfl:   ?  HYDROmorphone 2 mg tablet, Take 1 tablet (2 mg total) by  mouth every six (6) hours as needed for Severe Pain (Pain Scale 7-10) (please try oxycodone first (wait at least 1 hour after oxycodone administration), only if not yet due for next oxycodone. 2nd line). Max Daily Amount: 8 mg, Disp: 20 tablet, Rfl: 0  ?  ipratropium-albuterol 20-100 mcg/act inhaler, Inhale 2 puffs three (3) times daily as needed (SOB)., Disp: , Rfl:   ?  lansoprazole 30 mg DR capsule, Take 1 capsule (30 mg total) by mouth daily., Disp: 30 capsule, Rfl: 0 ?  lidocaine 5% patch, Place 1 patch onto the skin daily 12 hours on 12 hours off.., Disp: 30 patch, Rfl: 0  ?  lidocaine viscous 2% oral solution, Swish and spit 5 mLs three (3) times daily., Disp: , Rfl:   ?  menthol 5 mg lozenge, Use as directed 1 lozenge (5 mg total) in the mouth or throat every four (4) hours as needed for Cough or Sore Throat., Disp: 30 lozenge, Rfl: 0  ?  Naloxone HCl 4 MG/0.1ML LIQD, Call 911. Administer a single spray intranasally into one nostril for opioid overdose. May repeat in 3 minutes if patient is not breathing.., Disp: 2 each, Rfl: 2  ?  ondansetron 4 mg tablet, Take 1 tablet (4 mg total) by mouth every six (6) hours as needed for Nausea., Disp: 12 tablet, Rfl: 0  ?  oxyCODONE 5 mg tablet, Take 1-3 tabs (5-15 mg total) by mouth every 6-8 hours as needed for pain.., Disp: 60 tablet, Rfl: 0  ?  oxyCODONE-acetaminophen 5-325 mg tablet, Take 1 tablet by mouth every four (4) hours as needed. Max Daily Amount: 6 tablets, Disp: 5 tablet, Rfl: 0  ?  PANTOPRAZOLE SODIUM PO, Take 40 mg by mouth daily., Disp: , Rfl:   ?  promethazine 6.25 mg/5 mL solution, Take 6.25 mg by mouth every six (6) hours as needed (cough)., Disp: , Rfl:   ?  quetiapine 100 mg tablet, Take 1 tablet (100 mg total) by mouth two (2) times daily., Disp: , Rfl:   ?  sucralfate 1 g/10 mL suspension, Take 10 mLs (1 g total) by mouth three (3) times daily with meals and at bedtime., Disp: 420 mL, Rfl: 3    Allergies:  Hydrocodone and Ibuprofen     Family History  Family History   Problem Relation Age of Onset   ? Heart disease Mother    ? Diabetes Brother    ? Diabetes Father    ? Anesthesia problems Neg Hx    ? Malignant hypertension Neg Hx    ? Hypotension Neg Hx    ? Malignant hyperthermia Neg Hx    ? Pseudochol deficiency Neg Hx         Social History:  Social History     Socioeconomic History   ? Marital status: Single     Spouse name: Not on file   ? Number of children: Not on file   ? Years of education: Not on file ? Highest education level: Not on file   Occupational History   ? Not on file   Social Needs   ? Financial resource strain: Not on file   ? Food insecurity     Worry: Not on file     Inability: Not on file   ? Transportation needs     Medical: Not on file     Non-medical: Not on file   Tobacco Use   ? Smoking status: Current Every Day Smoker  Packs/day: 0.25     Types: Cigarettes   ? Smokeless tobacco: Current User   ? Tobacco comment: 30 years    Substance and Sexual Activity   ? Alcohol use: No   ? Drug use: No   ? Sexual activity: Never   Lifestyle   ? Physical activity     Days per week: Not on file     Minutes per session: Not on file   ? Stress: Not on file   Relationships   ? Social Wellsite geologist on phone: Not on file     Gets together: Not on file     Attends religious service: Not on file     Active member of club or organization: Not on file     Attends meetings of clubs or organizations: Not on file     Relationship status: Not on file   Other Topics Concern   ? Not on file   Social History Narrative   ? Not on file        Review of Systems:  Pertinent items are noted in HPI.    Vitals:  Last Recorded Vital Signs:    01/23/20 1500   BP: 122/75   Pulse: 68   Resp: 20   Temp:    SpO2: 97%       Physical Exam:  GENERAL: Well appearing in no apparent distress  RESP: Breathing comfortably on room air  ABD: Abdomen is soft, non distended, mildly tender without rebound or guarding  EXT: Extremities warm and well perfused without edema.    Labs:  BMP  Recent Labs     01/23/20  0828   NA 141   K 4.6   CL 109*   CO2 21   BUN 16   CREAT 1.15   GLUCOSE 40*   CALCIUM 9.4     LFT  Recent Labs     01/23/20  0828   TOTPRO 7.2   ALBUMIN 4.4   BILITOT <0.2   BILICON <0.2   ALT 18   AST 26   ALKPHOS 112   AMYLASE 117   LIPASE 55     CBC  Recent Labs     01/23/20  0828   WBC 6.22   HGB 9.1*   HCT 30.5*   MCV 81.8   PLT 480*     Coags  Recent Labs     01/23/20  0828   INR 0.9   PT 12.0       Imaging: XR chest ap portable (1 view)   Final Result by Olene Floss., MD [id number])   IMPRESSION:        A streaky left basilar opacity is favored to be atelectatic.            Signed by: Rubye Oaks   01/23/2020 9:43 AM           Microbiology:  Recent Results (from the past 120 hour(s))   Expedited COVID-19 and Influenza A B PCR, Respiratory Upper    Collection Time: 01/23/20 12:46 PM    Specimen: Nasopharyngeal; Respiratory, Upper   Result Value Ref Range    Specimen Type Respiratory, Upper     COVID-19 PCR Detected (A) Not Detected    Influenza A PCR Not Detected Not Detected    Influenza B PCR Not Detected Not Detected       Assessment/Plan:  Antonae Ports is a 54 y.o. female with a history of  peptic ulcer?disease s/p multiple surgeries,?now?s/p laparoscopic vagotomy and antrectomy, gastrojejunostomy, and hiatal hernia repair (06/21/19)    Recommend esophagus burger study, this was ordered to be completed as an outpatient, but can have it performed inpatient to expedite workup.  GI consult for endoscopy.    Author:    Madie Reno, MD  PGY-5 ~ General Surgery  01/23/2020 3:55 PM

## 2020-01-23 NOTE — ED Notes
Pt. Tolerating PO intake of juice well.  Blood sugar rechecked, value is 65.  Pt. Provided additional juice.  Will updated Dr. Barbaraann Barthel for additional orders.

## 2020-01-23 NOTE — H&P
HOSPITALIST HISTORY AND PHYSICAL    DATE OF SERVICE: 01/23/2020  ADMISSION DATE:       ADMITTING FACILITY:  [facility] Emergency Department  [address]  [place] [address]  Phone: [phone]    ADMITTING TEAM: Medicine Hospitalist  ATTENDING PHYSICIAN: Bridgette Habermann, MD  PMD: [facility]    CC: Abdominal Pain (N/V, and CP, stating she had ulcer surgery in july here, and ever since then she is not well, unbale to tolerate POs, throat swells after eating annd hard to breathe )    HPI:   Donna Gross is a 54 y.o. female presenting with ***    ED COURSE:  ED Triage Vitals [01/23/20 0756]   Temp Temp Source BP Heart Rate Resp SpO2 O2 Device Pain Score Weight   36.9 ?C (98.5 ?F) Oral 139/73 86 18 96 % None (Room air) Ten --       REVIEW OF SYSTEMS:  A complete review of 14 systems was performed. Additional symptoms were otherwise negative and/or non-contributory, except as listed above.    PREVIOUS RECORDS:  {records review:21682::''I have reviewed the patient's prior records in [facility], and incorporated details as relevant in the HPI and note as below.''}    PAST MEDICAL HISTORY:  She has a past medical history of Anxiety, Depression, Emphysema, unspecified (HCC/RAF), Fibromyalgia, Gastritis, GERD (gastroesophageal reflux disease), and Pancreatitis.    PAST SURGICAL HISTORY:  She has a past surgical history that includes Abdominal surgery and Colon surgery.    IMMUNIZATIONS:    There is no immunization history on file for this patient.    SOCIAL HISTORY:  She reports that she has been smoking cigarettes. She has been smoking about 0.25 packs per day. She uses smokeless tobacco. She reports that she does not drink alcohol or use drugs.    FAMILY HISTORY:  Her family history includes Diabetes in her brother and father; Heart disease in her mother.    ALLERGIES:  Hydrocodone and Ibuprofen    MEDICATIONS: No outpatient medications have been marked as taking for the 01/23/20 encounter [facility] Encounter).       VITALS:  Temp:  [36.9 ?C (98.5 ?F)] 36.9 ?C (98.5 ?F)  Heart Rate:  [62-86] 69  Resp:  [16-18] 16  BP: (125-139)/(72-79) 125/79  NBP Mean:  [88-92] 92  SpO2:  [95 %-98 %] 95 %     Weight:   Oxygen Therapy  SpO2: 95 %  O2 Device: None (Room air)     No intake/output data recorded.    PHYSICAL EXAM:  General: {appearance:315021::''alert, well appearing, and in no distress''}.  Head: {pe head:310326::''Atraumatic, normocephalic''}  Eyes: {pe eye exam abnormal findings:315209::''pupils equal and reactive, extraocular eye movements intact'',''sclera anicteric''}.  Oropharynx: {mouth:315326::''mucous membranes moist, pharynx normal without lesions''}.  Neck: {pe neck:315327::''supple, no significant adenopathy''}.  Heart: {heart exam:315510::''normal rate, regular rhythm, normal S1, S2, no murmurs, rubs, clicks or gallops''}. Peripheral pulses: {pe heart pulses peds:310345::''normal''}  Lungs: {chest:315033::''normal work of breathing'',''clear to auscultation, no wheezes, rales or rhonchi, symmetric air entry''}.  Abdomen: {abd exam:315920::''soft, nontender, nondistended, no masses or organomegaly''}  MSK: {msk exam:315950::''no joint tenderness, deformity or swelling''}.  Skin: {skin exam:315960::''normal coloration and turgor, no rashes, no suspicious skin lesions noted''}.  Neuro: {neuro:315902::''alert, oriented, normal speech, no focal findings or movement disorder noted''}    LABS:  I have reviewed the clinically relevant labs.    IMAGING:  I have reviewed the clinically relevant imaging.  ***  STUDIES:  I have reviewed the clinically relevant studies.  ***    ASSESSMENT:  Donna Gross is a 54 y.o. female presenting with ***    The following problems are being addressed in this hospitalization:  Current Hospital Problems           POA    Intractable nausea and vomiting Yes          1. ***    PLAN:  *** VTE Prophylaxis: {Blank single:19197::''warfarin'',''sequential compression devices (SCDs)'',''heparin subcu'',''ambulation'',''enoxaparin''}  Central Lines: {Central Lines:24299::''none''}. Tubes/Drains: {tube type:21611::''none''}. Airways: {Blank single:19197::''endotracheal tube'',''tracheostomy ([place] ***)'',''tracheostomy ([place] ***)'',''none''}  Diet: Diet NPO  Activity: {Blank multiple:19196::''out of bed TID with assist'',''inspiratory spirometer 10 times per hours while awake'',''as tolerated''}  Precautions: {Blank single:19197::''Contact isolation'',''Droplet isolation'',''Negative pressure isolation'',''Spore/C. diff isolation'',''No infectious isolation''}, {Blank multiple:19196::''standard'',''seizure'',''aspiration'',''fall''} precautions.    CODE STATUS:  Prior, Primary Emergency Contact: Donna Gross,Donna Gross, Home Phone: [phone]    DISPOSITION:  {admit:19197::''Observation status.'',''Admit to inpatient. I expect the patient will need to be hospitalized for at least two midnight based on the issues discussed above.''} Expected post-hospitalization disposition will be to {disposition:18248}.    I spent a cumulative *** minutes involved in direct patient care (''face to face time''), when I started caring for the patient at ***, until ***.    AUTHOR:  Bridgette Habermann, MD  01/23/2020 at 2:15 PM    CC:  [facility]  [address] / [place] [address]  Office: [phone]  Fax: None

## 2020-01-23 NOTE — ED Provider Notes
[facility]  Emergency Department Service Report    Crosby Bevan 54 y.o. female , presents with Abdominal Pain      Triage   Arrived on 01/23/2020 at 7:53 AM   Arrived by Walk-in [14]    ED Triage Vitals [01/23/20 0756]   Temp Temp Source BP Heart Rate Resp SpO2 O2 Device Pain Score Weight   36.9 ?C (98.5 ?F) Oral 139/73 86 18 96 % None (Room air) Ten --       Pre hospital care:       Allergies   Allergen Reactions   ? Hydrocodone Other (See Comments)     ''burns a hole in stomach''   ? Ibuprofen Other (See Comments)     GI discomfort    ''hurts my stomach''       History   Ladaja Yusupov is a 54 y.o. female who presents to the ED for evaluation of abd pain, chest pain, dyspnea, regurgitation of food and vomiting everytime she tries to eat for the past 6 months since her laparoscopic abdominal surgery.  She has been in and out of different EDs for the same symptoms but here today because it's ''overwhelming'', she can't eat, only water stays down.  Her doctor called her about doing a procedure to look down her throat but doesn't know the status of that.  She is taking is protonix.  Symptoms are intermittent only when she swallows food but are moderate.  Also hx of COPD.                 Past Medical History:   Diagnosis Date   ? Anxiety    ? Depression    ? Emphysema, unspecified (HCC/RAF)    ? Fibromyalgia    ? Gastritis    ? GERD (gastroesophageal reflux disease)    ? Pancreatitis         Past Surgical History:   Procedure Laterality Date   ? ABDOMINAL SURGERY     ? COLON SURGERY          Past Family History   family history includes Diabetes in her brother and father; Heart disease in her mother.                 Past Social History   she reports that she has been smoking cigarettes. She has been smoking about 0.25 packs per day. She uses smokeless tobacco. She reports that she does not drink alcohol, use drugs, or engage in sexual activity.     Review of Systems Constitutional: Positive for unexpected weight change (lost 25 pounds in 6 months). Negative for fever.   HENT: Negative for congestion and sore throat.    Eyes: Negative for visual disturbance.   Respiratory: Positive for cough and shortness of breath. Negative for chest tightness.    Cardiovascular: Positive for chest pain.   Gastrointestinal: Positive for abdominal pain, diarrhea, nausea and vomiting.   Genitourinary: Negative for dysuria.   Musculoskeletal: Negative for back pain and neck pain.   Skin: Negative for rash.   Neurological: Negative for dizziness.   Psychiatric/Behavioral: Positive for dysphoric mood.       Physical Exam   Physical Exam  Vitals signs and nursing note reviewed.   Constitutional:       Appearance: Normal appearance. She is well-developed.   HENT:      Head: Normocephalic and atraumatic.   Eyes:      Conjunctiva/sclera: Conjunctivae normal.   Neck:  Musculoskeletal: Neck supple.   Cardiovascular:      Rate and Rhythm: Normal rate.   Pulmonary:      Effort: Pulmonary effort is normal.   Abdominal:      General: There is no distension.      Tenderness: There is abdominal tenderness (mild diffuse, soft and nondistended).   Musculoskeletal: Normal range of motion.   Skin:     General: Skin is warm and dry.   Neurological:      General: No focal deficit present.      Mental Status: She is alert and oriented to person, place, and time.   Psychiatric:         Mood and Affect: Mood normal.         Behavior: Behavior normal.         ED Course          Laboratory Results     Labs Reviewed   BASIC METABOLIC PANEL - Abnormal; Notable for the following components:       Result Value    Chloride 109 (*)     Glucose 40 (*)     All other components within normal limits   UA,DIPSTICK - Abnormal; Notable for the following components:    Specific Gravity 1.033 (*)     Ketones Trace (*)     Protein Trace (*)     All other components within normal limits UA,MICROSCOPIC - Abnormal; Notable for the following components:    Hyaline Casts 3-5/LPF (*)     All other components within normal limits   CBC (PERFORMABLE) - Abnormal; Notable for the following components:    Red Blood Cell Count 3.73 (*)     Hemoglobin 9.1 (*)     Hematocrit 30.5 (*)     Mean Corpuscular Hemoglobin 24.4 (*)     MCH Concentration 29.8 (*)     Red Cell Distribution Width-SD 50.1 (*)     Red Cell Distribution Width-CV 17.1 (*)     Platelet Count, Auto 480 (*)     Mean Platelet Volume 8.7 (*)     All other components within normal limits   POCT GLUCOSE - Abnormal; Notable for the following components:    Glucose, Point of Care 140 (*)     All other components within normal limits   PROTHROMBIN TIME PANEL - Normal   HEPATIC FUNCT PANEL - Normal   LIPASE - Normal   AMYLASE - Normal   POCT GLUCOSE - Normal   RAINBOW DRAW TO LABORATORY    Narrative:     The following orders were created for panel order [place] Draw to Laboratory.  Procedure                               Abnormality         Status                     ---------                               -----------         ------                     Extra Burna Mortimer WGN[[id number]]  Final result                 Please view results for these tests on the individual orders.   CBC & AUTO DIFFERENTIAL    Narrative:     The following orders were created for panel order CBC & Plt & Diff.  Procedure                               Abnormality         Status                     ---------                               -----------         ------                     ZOX[[id number]]                          Abnormal            Final result               Differential, Automated[[id number]]                          Final result                 Please view results for these tests on the individual orders.   URINALYSIS W/REFLEX TO CULTURE    Narrative:     The following orders were created for panel order Urinalysis w/Reflex to Culture. Procedure                               Abnormality         Status                     ---------                               -----------         ------                     UA,Dipstick[[id number]]                  Abnormal            Final result               UA,Microscopic[[id number]]               Abnormal            Final result                 Please view results for these tests on the individual orders.   EXTRA LIGHT GREEN TOP   DIFFERENTIAL, AUTOMATED (PERFORMABLE)   POCT URINE PREGNANCY       Imaging Results     XR chest ap portable (1 view)   Final Result by Olene Floss., MD [phone])   IMPRESSION:        A streaky left basilar opacity is favored to be  atelectatic.            Signed by: Rubye Oaks   01/23/2020 9:43 AM          Administered Medications     Medication Administration from 01/23/2020 0753 to 01/23/2020 1223       Date/Time Order Dose Route Action Action by Comments     01/23/2020 0828 ondansetron 4 mg/2 mL inj 4 mg 4 mg Intravenous Given Waynetta Pean, RN      01/23/2020 1053 sodium chloride 0.9% IV soln bolus 1,000 mL 0 mL Intravenous Stopped Delora Fuel, RN      01/23/2020 [id number] sodium chloride 0.9% IV soln bolus 1,000 mL 1,000 mL Intravenous New Bag/ Syringe/ [address] Waynetta Pean, [place]      01/23/2020 6213 pantoprazole inj 40 mg 40 mg IV Push Given Waynetta Pean, RN      01/23/2020 0865 morphine PF 4 mg/mL inj 4 mg 4 mg IV Push Given Waynetta Pean, RN      01/23/2020 7846 diphenhydrAMINE 50 mg/mL inj 25 mg 25 mg IV Push Given Alyssa Grove, RN      01/23/2020 1107 HYDROmorphone 1 mg/mL inj 1 mg 1 mg IV Push Given Waynetta Pean, RN      01/23/2020 1107 ondansetron 4 mg/2 mL inj 4 mg 4 mg Intravenous Given Corder, Christi, RN           Procedures   Procedures    MDM    Alethia Berthold presented with vomiting and dysphagia Review of records reveals: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   ED Course and plan of care: patient evaluated by surgery Dr. [name] 11/24/2019 regarding these symptoms and he ordered upper GI fluoroscopy as well as esophageal manometry with plan to f/u with the patient after the tests are done.  Patient here today because symptoms are getting worse and she can't keep anything down.  Labs done and show hypoglycemia with gluc 40, wbc 6, hgb 9.1, cr 1.1, LFTS normal, UA without infection but with tr ketones.  She was given IV protonix, NS bolus, morphine and dilaudid for pain.  On reassessement patient still has a lot of pain and not able to tolerate solid foods.  Will admit for treatment of n/v.        Clinical Impression     1. Intractable nausea and vomiting    2. Hypoglycemia        Prescriptions     New Prescriptions    No medications on file       Disposition and Follow-up   Disposition:     Future Appointments   Date Time Provider Department Center   01/30/2020  9:30 AM [facility], NURSE/CLINICAL SUPPORT [facility] MEDICINE       Follow up with:  No follow-up provider specified.    Return precautions are specified on After Visit Summary.                 Vernard Gambles A., MD  01/23/20 1459

## 2020-01-23 NOTE — ED Notes
Pt taken to [place]. States pain is controlled and is ok to go to [place] at this time, requesting to be medicated for pain upon return.

## 2020-01-23 NOTE — ED Notes
Phone provided to pt. To speak with daughter [name].

## 2020-01-23 NOTE — ED Notes
Report called to Irene RN

## 2020-01-23 NOTE — ED Notes
Pt reports itchiness around IV site after Morphine. Pt requesting Benadryl. Dr Barbaraann Barthel notified.

## 2020-01-23 NOTE — ED Notes
Pt. In [place] when returned from 1 hour lunch break.  Plan to start IV fluids and complete EKG on pt. Return. Pt. Declined pain medications prior to transport to [place].

## 2020-01-23 NOTE — ED Notes
Pt c/o increase itchiness post morphine administration, hospitalist made aware, pending new orders

## 2020-01-24 ENCOUNTER — Ambulatory Visit: Payer: PRIVATE HEALTH INSURANCE

## 2020-01-24 LAB — D-Dimer
D-DIMER STAGO: 0.37 ug{FEU}/mL (ref <0.60)
D-DIMER STAGO: 0.34 ug{FEU}/mL (ref <0.60)

## 2020-01-24 LAB — Lactate Dehydrogenase
LACTATE DEHYDROGENASE: 218 U/L (ref 125–256)
LACTATE DEHYDROGENASE: 177 U/L (ref 125–256)

## 2020-01-24 LAB — CBC: MEAN CORPUSCULAR HEMOGLOBIN: 24 pg — ABNORMAL LOW (ref 26.4–33.4)

## 2020-01-24 LAB — Glucose,POC
GLUCOSE,POC: 117 mg/dL — ABNORMAL HIGH (ref 65–99)
GLUCOSE,POC: 108 mg/dL — ABNORMAL HIGH (ref 65–99)
GLUCOSE,POC: 108 mg/dL — ABNORMAL HIGH (ref 65–99)
GLUCOSE,POC: 87 mg/dL (ref 65–99)

## 2020-01-24 LAB — Phosphorus: PHOSPHORUS: 3.8 mg/dL (ref 2.3–4.4)

## 2020-01-24 LAB — Magnesium: MAGNESIUM: 1.5 meq/L (ref 1.4–1.9)

## 2020-01-24 LAB — Differential Automated: MONOCYTE PERCENT, AUTO: 7.3 (ref 0.00–0.04)

## 2020-01-24 LAB — Comprehensive Metabolic Panel
ALKALINE PHOSPHATASE: 90 U/L (ref 37–113)
POTASSIUM: 4.6 mmol/L (ref 3.6–5.3)

## 2020-01-24 LAB — C-Reactive Protein: C-REACTIVE PROTEIN: <0.3 mg/dL (ref <0.8)

## 2020-01-24 MED ADMIN — NICOTINE 14 MG/24HR TD PT24: 14 mg | TRANSDERMAL | @ 01:00:00 | Stop: 2020-02-22

## 2020-01-24 MED ADMIN — ACETAMINOPHEN 10 MG/ML IV SOLN: 1000 mg | INTRAVENOUS | @ 01:00:00 | Stop: 2020-01-24 | NDC 24201010024

## 2020-01-24 MED ADMIN — LIDOCAINE 5 % EX PTCH: 1 | TRANSDERMAL | @ 02:00:00 | Stop: 2020-02-23 | NDC 00591352530

## 2020-01-24 MED ADMIN — QUETIAPINE FUMARATE 100 MG PO TABS: 100 mg | ORAL | @ 07:00:00 | Stop: 2020-02-23 | NDC 00904664061

## 2020-01-24 MED ADMIN — ENOXAPARIN SODIUM 40 MG/0.4ML SC SOLN: 40 mg | SUBCUTANEOUS | @ 16:00:00 | Stop: 2020-01-31 | NDC 60505079204

## 2020-01-24 MED ADMIN — DICYCLOMINE HCL 10 MG PO CAPS: 10 mg | ORAL | @ 07:00:00 | Stop: 2020-01-31 | NDC 60687036901

## 2020-01-24 MED ADMIN — DEXTROSE-NACL 5-0.9 % IV SOLN: 100 mL/h | INTRAVENOUS | @ 07:00:00 | Stop: 2020-02-22 | NDC 00338008904

## 2020-01-24 MED ADMIN — HYDROMORPHONE HCL 1 MG/ML IJ SOLN: .4 mg | INTRAVENOUS | @ 02:00:00 | Stop: 2020-01-24 | NDC 00409128331

## 2020-01-24 MED ADMIN — OXYCODONE HCL 5 MG PO TABS: 5 mg | ORAL | @ 20:00:00 | Stop: 2020-01-31 | NDC 00406055262

## 2020-01-24 MED ADMIN — ONDANSETRON HCL 4 MG/2ML IJ SOLN: 4 mg | INTRAVENOUS | @ 04:00:00 | Stop: 2020-02-22 | NDC 60505613005

## 2020-01-24 MED ADMIN — PANTOPRAZOLE SODIUM 40 MG PO TBEC: 40 mg | ORAL | @ 17:00:00 | Stop: 2020-01-31 | NDC 50268063915

## 2020-01-24 MED ADMIN — OXYCODONE HCL 5 MG PO TABS: 5 mg | ORAL | @ 14:00:00 | Stop: 2020-01-31 | NDC 00406055262

## 2020-01-24 MED ADMIN — SUCRALFATE 1 GM/10ML PO SUSP: 1 g | ORAL | @ 07:00:00 | Stop: 2020-02-23 | NDC 68094004361

## 2020-01-24 MED ADMIN — ACETAMINOPHEN 10 MG/ML IV SOLN: 1000 mg | INTRAVENOUS | @ 07:00:00 | Stop: 2020-01-24 | NDC 24201010024

## 2020-01-24 MED ADMIN — METHOCARBAMOL 500 MG PO TABS: 500 mg | ORAL | @ 04:00:00 | Stop: 2020-02-23 | NDC 60687055901

## 2020-01-24 NOTE — Interdisciplinary
Safe Transition Progression Note  utilizing BOOST (Better Outcomes by Optimizing Safe Transitions)    Name: Donna Gross  MRN: [id number]  DOB: September 20, 1966    Date of Admit:   Anticipated Date of Discharge: 01/25/2020    Anticipated Disposition:  TBD    Primary Care Provider: [facility]  Attending Physician: Bridgette Habermann, MD  Physician Team: Surgery    LACE+ Score: LACE+ Score: 47    Total Score Risk Stratification   0-28 Minimal Risk   29-58 Moderate Risk   59-78 High Risk   79-90 Highest Risk     BOOST Elements Best Practice / Interventions Status   RN   P1 # Problems with Medications Teach back needs indicated for high risk medication(s) []  N/A  []  Insulin   []  Anticoagulants    [x]  Opioids  []  Immunosuppressive drugs    Teach back completed for []  N/A  []  Insulin   []  Anticoagulants: ____  [x]  Opioids:Oxycodone, Dilaudid   []  Immunosuppressive drugs: ____  []  Pending    Requested for DM educator consultation [x]  N/A  []  Yes  []  No    Discharge medication received  [x]  N/A  []  Yes  []  Pending    Provided patient education on high risk medication for discharge [x]  N/A  []  Yes  []  Pending    Completed assessment of primary learner's understanding on high risk discharge medications [x]  N/A  []  Yes  []  No   P2 # Psychological [place] Suicide Risk Assessment No risk    Requested for SW consultation for other needs: [x]  N/A  []  Patient: _____    []  Insufficient care giver   []  Meals at home  []  Financial needs    []  Transportation to appointments    []  Higher care needs   P3 # Principal Diagnosis SpO2 post ambulation off oxygen documented (w/in 48hrs of discharge) [x]  N/A   []  Yes: ___% at ___ (date) ____ (time)    []  No    Educated provided for [x]  N/A         []  Drain: _____    []  Enteral feeding    []  Foley catheter  []  Leg bag    []  PICC      []  PortACath   []  Oxygen  []   PleurX  []  Wound Vac  []  No P4 # Physical Limitations Requested for PT consultation from MD [x]  N/A  []  Yes  []  No    Requested for SLP consultation from MD [x]  N/A  []  Yes  []  No    Confirmed discharge DME delivery to bedside [x]  N/A  []  FWW  []  Wheel chair  []  Cane  []  Crutches  []  AVAP  []  Nebulizer   []  Portable oxygen  []  Wound Vac  []  No    Identified transportation for discharge [x]  Family/friend  []  Taxi/Uber   []  Ambulance []  ACLS  []  RN  []  RT   []  Non-emergent transportation   []  Other: _____   P5 # Health Literacy Primary learner is  [x]  Patient  []  Other: Name: __________  Relationship: __________  Contact number: ________   P6 # Patient Support Identified primary caregiver [x]  Patient []  Name:______& relationship of other: ______   MD/NP   P1 # Problems with Medications Preferred Pharmacy   [facility] [place], [place] - [phone]  [phone] [place] [address]  Pre-authorization for discharge medication ordered  []  N/A []  Yes for ______      Verified pre-authorization approval []  N/A []  Yes for ______    []  No _______   P3 # Principal Diagnosis Anticipated discharge destination []  Home []  Facility    Completed orders for medical care needs []  N/A  []  HH RN    []  HH Med Rec  []  Home IV antibiotics   []  HH IV care     []  HH labs    []  HH TPN  []  HH enteral feeding  []  HH drain care   []  HH urinary catheter  []  HH wound care []  PT/OT  []  Other:_______  []  Pending    U Breathe Navigator consultation requested for []  N/A  []  COPD  []  Pneumonia    []  Bronchiectasis  []  Cystic fibrosis  []  Asthma  []  Reactive airway disease  []  Other chronic lung disease  []  Pending    Entered orders for DME []  N/A   []  Portable oxygen /concentrator    []  Nebulizer            []  AVAP    []  Suction equipment  []  PleurX supplies  []  Wound Vac    []  Other: _______  []  Pending    Hospice consultation requested []  N/A  []  Yes  []  No ______ P4 # Physical Limitations DME order filled for []  N/A  []  FWW  []  Wheel chair  []  Cane    []  Crutches  []  3-in-1 Bedside commode   []  Tub bench  []  Bed  []  Other:______  []  Pending   P6 # Patient Support Order placed for [facility] []  N/A  []  Yes  []  No _____    Order placed for Home Health Aide []  N/A  []  Yes    P7 # Prior Hospitalizations Identified unplanned readmission within 30 days []  Yes  []  No    Submitted referral for Ambulatory Care Coordination for patients with [place] PCP []  N/A  []  Yes    P8 # Palliative Care Submitted referral for Palliative Care team for  []  N/A  []  Uncontrolled symptoms    []  Need of discussion for care goal   []  No ____   Inpatient Clinical Pharmacist   P1 # Problems with Medications Consultation provided   (Polypharmacy >15 or high risk medications upon discharge) []  Yes for high risk medication: ___ (name)  []  Medication reconciliation upon admission  []  Discharge medication reconciliation  []  Pending    Case Management   P3 # Principal Diagnosis & Disposition place identified []  Home  []  Family: ____________    []  Friend: __________   []  Acute rehab  []  Drug rehab  []  Rehab    []  LTAC  []  SNF  []  Subacute   []  Recuperative care []  Shelter  []  Hospital: ____  []   RR  []  NPH   P4 # Physical Limitations Submitted for [facility] services for []  N/A   []  HH RN         []  HH Med Rec  []  Home IV antibiotics   []  HH IV care   []  HH labs                     []  HH TPN     []  HH enteral feeding  []  HH drain care   []   HH urinary catheter  []  HH wound care   []  PT ? OT    []   Other _____  []  No    Arranged outpatient dialysis center []  N/A  []  Setup  []  Resume    []  Chair time location  []  No    Submitted DME for approval for []  N/A  []  Portable oxygen /concentrator   []  Nebulizer     []  AVAP    []  Suction equipment []  PleurX supplies   []  Wound Vac  []  Other _____  []  No     Requested DME for delivery to home []  N/A []  FWW ? Wheel chair  []  Cane []  Crutches  []  3-in-1 Bedside commode  []  Tub bench  []  Bed   []  Oxygen concentrator  []  PleurX supplies  []  No     Requested DME for delivery to hospital []  N/A  []  FWW  []  Wheel chair  []  Cane  []  Crutches    []  3-in-1 Bedside commode   []  Tub bench   []  Portable oxygen   []  Wound Vac  []  No     Submitted approval for discharge transportation by []  N/A  []  Ambulance  []  ACLS  []  RT    []  RN   []  Non-emergent transportation   []  No   P3 # Principal Diagnosis &   P4 # Physical Limitations  P6 # Patient Support Identified barriers for care at home []  N/A  []  Patient _____   []  Family refusal _____  []  Insufficient care giver   []  Conservatorship  []  Meals at home  []  Financial needs    []  Stairs at home  []  Transportation to appointments  []  Transportation for discharge  []  Higher care needs    []  Other:_____   P8 # Palliative Care Verified consent for hospice []  N/A  []  Yes  []  No   Designer, fashion/clothing # Psychological Consultation provided []  N/A  []  Yes  []  Pending    P6 # Patient Support Consulted for []  N/A  []  Housing  []  Financial concerns   []  Other:_____   RT   P3 #Principal Diagnosis Consultation provided []  N/A  []  COPD  []  Pneumonia    []  Bronchiectasis  []  Cystic fibrosis  []  Asthma  []  Reactive airway disease  []  Other chronic lung disease  []  Pending    Discharge DME requested []  AVAP  []  Nebulizer  []  Pending   P6 # Patient Support U Breathe Navigator indicated for []  N/A  []  Breathing treatment needs upon discharge  []  DME for breathing treatment upon discharge   PT/OT   P4 # Physical Limitations Discharge recommendation   ,      Discharge DME recommended

## 2020-01-24 NOTE — Progress Notes
Received patient from ED. Throat/chest/Abdominal pain still persistent. Morphine not effective and gets itchy. Requested dilaudid from MD; pending order. Tolerating clear liquids. BMAT 4. Aware to call RN for assistance.

## 2020-01-24 NOTE — Consults
Inpatient Gastroenterology Consultation    Date of service: 01/24/2020  Primary attending: Ozzie Hoyle., MD   Consult attending: Dr. Elita Boone    Reason for consult: dysphagia    History of present illness:  Donna Gross is a 54 y.o. female with h/o refractory and perforated duodenal ulcer s/p transverse colectomy, colocutaneous fistula and diverting ileostomy and more recent lap vagotomy, partial gastrectomy, antrectomy, Billroth II reconstruction, and hiatal hernia repair (06/2019) admitted with N/V and dysphagia    Pt states that since surgery in 06/2019 has had issues with solid food getting stuck in the upper throat leading to vomiting and tachycardia. Occurs with eating solids but not liquids. Hungry but doesn't eat d/t these episodes. Weight has been stable 135-140 lbs since surgery. Did not have these issues prior to surgery. Has chronic abdominal pain, which is unchanged. BMs are brown, formed.    Past medical history:  Past Medical History:   Diagnosis Date   ? Anxiety    ? Depression    ? Emphysema, unspecified (HCC/RAF)    ? Fibromyalgia    ? Gastritis    ? GERD (gastroesophageal reflux disease)    ? Pancreatitis        Past surgical history:  Past Surgical History:   Procedure Laterality Date   ? ABDOMINAL SURGERY     ? COLON SURGERY         Allergies: Hydrocodone, Ibuprofen, and Morphine    Home medications:   Prior to Admission medications    Medication Sig Start Date End Date Taking? Authorizing Provider   albuterol 90 mcg/act inhaler Inhale 2 puffs every eight (8) hours as needed (SOB).   Yes [provider]   albuterol (2.5 mg/68mL) 0.083% nebulizer solution Take 2.5 mg by nebulization every eight (8) hours as needed (SOB) .    [provider]   aluminum-magnesium hydroxide-simethicone (ALMACONE) 200-200-20 mg/5 mL suspension Take 5 mLs by mouth three (3) times daily.    [provider] aluminum-magnesium hydroxide-simethicone 400-400-40 mg/5 mL suspension Take 10 mLs by mouth every four (4) hours as needed. 07/02/19   Modesto Charon., MD   clobetasol 0.05% cream Apply topically three (3) times daily as needed (rash).    [provider]   cyclobenzaprine 10 mg tablet Take 10 mg by mouth two (2) times daily as needed for Muscle spasms.    [provider]   dicyclomine 20 mg tablet Take 0.5 tablets (10 mg total) by mouth two (2) times daily. 07/12/19   Delma Officer., MD   fluticasone-salmeterol [facility] IN[facility]UB) 250-50 mcg/dose diskus Inhale 1 puff two (2) times daily as needed (SOB).    [provider]   HYDROmorphone 2 mg tablet Take 1 tablet (2 mg total) by mouth every six (6) hours as needed for Severe Pain (Pain Scale 7-10) (please try oxycodone first (wait at least 1 hour after oxycodone administration), only if not yet due for next oxycodone. 2nd line). Max Daily Amount: 8 mg 07/14/19   Dejesus, Katrinka Blazing., NP   ipratropium-albuterol 20-100 mcg/act inhaler Inhale 2 puffs three (3) times daily as needed (SOB).    [provider]   lansoprazole 30 mg DR capsule Take 1 capsule (30 mg total) by mouth daily. 08/09/19   Dabby, Jamison Oka., MD   lidocaine 5% patch Place 1 patch onto the skin daily 12 hours on 12 hours off.. 07/02/19   Alonna Buckler., MD   lidocaine viscous 2% oral solution Swish and  spit 5 mLs three (3) times daily.    [provider]   menthol 5 mg lozenge Use as directed 1 lozenge (5 mg total) in the mouth or throat every four (4) hours as needed for Cough or Sore Throat. 07/02/19   Alonna Buckler., MD   Naloxone HCl 4 MG/0.1ML LIQD Call 911. Administer a single spray intranasally into one nostril for opioid overdose. May repeat in 3 minutes if patient is not breathing.. 07/02/19   Modesto Charon., MD   ondansetron 4 mg tablet Take 1 tablet (4 mg total) by mouth every six (6) hours as needed for Nausea. 07/12/19   Delma Officer., MD oxyCODONE 5 mg tablet Take 1-3 tabs (5-15 mg total) by mouth every 6-8 hours as needed for pain.. 07/14/19   Dejesus, Katrinka Blazing., NP   oxyCODONE-acetaminophen 5-325 mg tablet Take 1 tablet by mouth every four (4) hours as needed. Max Daily Amount: 6 tablets 08/09/19   Dabby, Jamison Oka., MD   PANTOPRAZOLE SODIUM PO Take 40 mg by mouth daily.    [provider]   promethazine 6.25 mg/5 mL solution Take 6.25 mg by mouth every six (6) hours as needed (cough).    [provider]   quetiapine 100 mg tablet Take 1 tablet (100 mg total) by mouth two (2) times daily. 12/27/15   Deniece Ree, NP   sucralfate 1 g/10 mL suspension Take 10 mLs (1 g total) by mouth three (3) times daily with meals and at bedtime. 07/02/19   Modesto Charon., MD        Current medications:  acetaminophen, 1,000 mg, Intravenous, Q8H  dicyclomine, 10 mg, Oral, BID  enoxaparin, 40 mg, Subcutaneous, Daily  lidocaine, 1 patch, Transdermal, Q24H  methocarbamol, 500 mg, Oral, TID  nicotine, 1 patch, Transdermal, Daily  pantoprazole, 40 mg, Oral, BID  QUEtiapine, 100 mg, Oral, BID  sucralfate, 1 g, Oral, TID w/meals & QHS  ? dextrose 5%/0.9% NaCl 100 mL/hr (01/24/20 0540)     PRN medications: aluminum-magnesium hydroxide-simethicone, cyclobenzaprine, diphenhydrAMINE, fluticasone-salmeterol, HYDROmorphone, HYDROmorphone, ipratropium-albuterol, ondansetron ODT **OR** ondansetron injection/IVPB, oxyCODONE    Family History:  Family History   Problem Relation Age of Onset   ? Heart disease Mother    ? Diabetes Brother    ? Diabetes Father    ? Anesthesia problems Neg Hx    ? Malignant hypertension Neg Hx    ? Hypotension Neg Hx    ? Malignant hyperthermia Neg Hx    ? Pseudochol deficiency Neg Hx        Social history:  Social History     Tobacco Use   ? Smoking status: Current Every Day Smoker     Packs/day: 0.25     Types: Cigarettes   ? Smokeless tobacco: Current User   ? Tobacco comment: 30 years    Substance Use Topics   ? Alcohol use: No ? Drug use: No       Review of systems:  A 14- point review of systems was negative except where noted in the HPI.    Physical exam:  Vitals:  Temp:  [36.1 ?C (97 ?F)-37.1 ?C (98.7 ?F)] 36.4 ?C (97.5 ?F)   BP: (104-155)/(63-82) 104/63   Heart Rate:  [58-69] 69   Resp:  [16-24] 18   SpO2:  [93 %-98 %] 97 %     General: multiple episodes of vomiting up food, uncomfortable-appearing  HEENT: anicteric sclera, MMM, no thrush  Pulm: CTAB, no wheezes  or rhonchi  CV: RRR no murmurs  Abd: soft, diffuse mild TTP, nondistended, no rebound or guarding  Ext: no peripheral edema, no clubbing or cyanosis   Neuro: alert and oriented, no focal neurologic deficits  Skin: wwp, no rashes    Laboratory data:  Recent Labs     01/23/20  0828   WBC 6.22   NEUTPCT 66.0   HGB 9.1*   HCT 30.5*   PLT 480*   MCV 81.8     Recent Labs     01/23/20  0828   NA 141   K 4.6   CL 109*   CO2 21   ANIONGAP 11   BUN 16   CREAT 1.15     Recent Labs     01/23/20  0828   MG 1.5   CALCIUM 9.4   PHOS 3.8      Recent Labs     01/23/20  0828   TOTPRO 7.2   ALBUMIN 4.4   BILITOT <0.2   BILICON <0.2   ALT 18   AST 26   ALKPHOS 112     Lab Results   Component Value Date    PT 12.0 01/23/2020    INR 0.9 01/23/2020    APTT 32.2 12/03/2018     No results found for: FE, TIBC, TRNSFERR, FEBINDSAT, FERRITIN  No results found for: VITAMINB12, FOLATE    Radiology:  CT Neck 06/2019  IMPRESSION:  No neck mass or cervical lymphadenopathy.   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   Partially imaged nonspecific right mastoid effusion.     Esophagram 06/2019  IMPRESSION:   Status post gastrojejunostomy and hiatal hernia repair with normal esophageal motility and transit of contrast into the gastric remnant and jejunum.    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 Nondilated bowel with no wall thickening Normal appendix.   IMPRESSION:  1.  No evidence of bowel obstruction. 2.  Stable gallbladder distention with trace pericholecystic fluid. Stable mild central intrahepatic biliary dilatation.    Abdominal US 01/23/20  IMPRESSION:  1.  As compared to the CT of 08/09/2019, common bile duct distention appears more pronounced, wall gallbladder distention and mild pancreatic ductal enlargement do not appear significantly changed.  2.  A small amount of nonobstructive sludge layers in the gallbladder neck. No visible debris is seen within the common bile duct, however the distal duct is not well-visualized.    Endoscopy:  EGD 01/2019        Impression:  Kaiesha Masud is a 54 y.o. female with h/o refractory and perforated duodenal ulcer s/p transverse colectomy, colocutaneous fistula and diverting ileostomy and more recent lap vagotomy, partial gastrectomy, antrectomy, Billroth II reconstruction, and hiatal hernia repair (06/2019) admitted with N/V and dysphagia    # dysphagia  # N/V  # aberrant right subclavian artery  # s/p vagotomy  Pt p/w upper esophageal dysphagia to solids only with associated N/V after eating that started after vagotomy, partial gastrectomy, antrectomy and hiatal hernia repair in 06/2019. Also found to have an aberrant right subclavian artery with a retroesophageal course, which is a rare cause of dysphagia known as dysphagia lusoria. Although this extrinsic compression was not seen on esophagram in 06/2019. DDx also includes esophageal dysmotility or gastroparesis 2/2 vagotomy or further vagal nerve injury or anastomotic ulcer(s) or inflammation causing obstruction, although the latter less likely with unremarkable imaging. Recommend  further evaluation with esophagram (burger study if possible given dysphagia w/ solids only). Can consider EGD to evaluate for structural esophageal causes, although unlikely to be revealing.    Recommendations:  ? Mechanical soft diet  ? Nutrition consult, likely will need Boost/Ensure TID  ? Esophagram burger study if able ? Tentatively plan for EGD Friday  ? PPI IV BID  ? Trial of reglan 10mg  q8h  ? We will continue to follow. Please call if questions.    Recommendations discussed with primary team. Patient seen and discussed with my attending Dr. Elita Boone who agrees with above impressions and recommendations.     Janene Harvey. Agnes Lawrence, MD  GI Fellow, [phone] (if after 5pm or weekend, page on-call GI fellow)  01/24/2020 8:30 AM

## 2020-01-24 NOTE — Progress Notes
Medicine Progress Note     PMD: [facility], MD  DATE OF SERVICE: 01/24/2020  HOSPITAL DAY: 0  CHIEF COMPLAINT: Abdominal Pain (N/V, and CP, stating she had ulcer surgery in july here, and ever since then she is not well, unbale to tolerate POs, throat swells after eating annd hard to breathe )    Last 24 Hour/Overnight Events:   None    Subjective/Review of Systems:   Pt reports long standing N/V, PO intolerance since her last surgery 7/20 that has been worsening. She has been to multiple ERs recently for these symptoms. Liquids mostly ok. Solids go down, then she feels chest pressure and palpitations followed by a sensation of swelling in her upper esophagus, pharynx and then generally vomits thick white contents. Occasionally she will have sudden diarrhea with eating.     Regarding COVID, she notes that her 2 daughters (1 she lives with, 1 she is around frequently) are both negative. She doesn't believe she has COVID. Her last exposure was going to [facility] on Saturday. Denies fever, chills, myalgias, congestion, sore through, cough, SOB, new GI symptoms, rash.     Medications:   Scheduled:  dicyclomine, 10 mg, Oral, BID  enoxaparin, 40 mg, Subcutaneous, Daily  lidocaine, 1 patch, Transdermal, Q24H  methocarbamol, 500 mg, Oral, TID  metoclopramide, 10 mg, IV Push, TID AC  nicotine, 1 patch, Transdermal, Daily  pantoprazole, 40 mg, Oral, BID  QUEtiapine, 100 mg, Oral, BID  sucralfate, 1 g, Oral, TID w/meals & QHS  PRN:  aluminum-magnesium hydroxide-simethicone, cyclobenzaprine, diphenhydrAMINE, fluticasone-salmeterol, HYDROmorphone, HYDROmorphone, ipratropium-albuterol, ondansetron ODT **OR** ondansetron injection/IVPB, oxyCODONE  Infusions:  ? dextrose 5%/0.9% NaCl 100 mL/hr (01/24/20 0540)       Physical Exam:   Temp:  [36.1 ?C (97 ?F)-37.1 ?C (98.7 ?F)] 36.8 ?C (98.2 ?F)  Heart Rate:  [63-69] 68  Resp:  [16-18] 18  BP: (104-155)/(61-82) 123/66  NBP Mean:  [77-102] 83 SpO2:  [93 %-97 %] 95 %  I/O: I/O last 2 completed shifts:  In: 3466.7 [P.O.:960; I.V.:1506.7; IV Piggyback:1000]  Out: 1500 [Urine:1500]  Const: NAD  Eyes: Sclerae anicteric  ENMT: No oral lesions, MMM  CV: RRR, no murmurs, no edema  Resp: CTAB, no accessory muscle use  GI: Soft, TTP throughout worse in epigastrium. Non distended. normoactive BS. Multiple old scars present  Skin: No rashes or nodules  Psych: A+Ox3    Data:   I have reviewed all of the labs from today. Pertinent labs include:   CBC  Recent Labs     01/24/20  1407 01/23/20  0828   WBC 5.77 6.22   HGB 7.6* 9.1*   HCT 25.3* 30.5*   MCV 79.8 81.8   PLT 413* 480*     BMP  Recent Labs     01/24/20  1406 01/23/20  0828   NA 142 141   K 4.6 4.6   CL 109* 109*   CO2 23 21   BUN 7 16   CREAT 1.05 1.15   CALCIUM 8.8 9.4   MG  --  1.5   PHOS  --  3.8   Glucose: 40->83    LFT  Recent Labs     01/24/20  1406 01/23/20  0828   TOTPRO 5.9* 7.2   ALBUMIN 3.6* 4.4   BILITOT 0.2 <0.2   BILICON  --  <0.2   ALT 67* 18   AST 82* 26   ALKPHOS 90 112   AMYLASE  --  117   LIPASE  --  55     Coags  Recent Labs     01/23/20  0828   INR 0.9   PT 12.0       I have reviewed the studies from today. Pertinent study results include:   2/2: RUQ [place]  1.  As compared to the CT of 08/09/2019, common bile duct distention appears more pronounced, wall gallbladder distention and mild pancreatic ductal enlargement do not appear significantly changed.  2.  A small amount of nonobstructive sludge layers in the gallbladder neck. No visible debris is seen within the common bile duct, however the distal duct is not well-visualized.    2/2 KUB: Normal exam    2/2 CXR: Streaky left basilar opacity is favored to be atelectatic    07/12/19: CTneck w contrast  No neck mass or cervical lymphadenopathy.   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   Partially imaged nonspecific right mastoid effusion. I have reviewed the electrocardiogram from today. It showed   2/2: Sinus, HR 63, nl axix, short PR, no ischemic changes  Problem-Based Assessment and Plan:   Donna Gross is a 54 y.o. female with history of emphysema and h/o complex peptic ulcer disease s/p multiple surgeries, last in July 2020 (laparoscopic vagotomy and antrectomy, gastrojejunostomy, and hiatal hernia repair c/b ongoing nausea, vomiting, dysphagia, poor po tolerance for which follows with general surgery  (last seen 12/20) who presents for same found to be hypoglycemic to 40 and COVID positive.    #Intractable N/V, dysphagia, PO intolerance after multiple abdominal surgeries for peptic ulcer disease. Ddx includes structural causes such as result of hiatal hernia repair, dysphagia lusoria (2/2 aberrant retroesophageal right subclavian, noted by GI team), motility disorder as result of vagotomy, autonomic dysfunction after vagotomy (given sensation of swelling and sweating after taking food), ongoing ulcerative disease/gastritis  -Per GI and Gen Surg, pt would benefit from esophagus burger study (given symptoms occur with solids mainly). Will reorder for tomorrow  -GI will work to coordinate endoscopy for likely Friday  -Nutrition consult  -Mechanical soft diet, boost supplements  -Reglan 10mg  IV Q8 for motility  -Continue home GI regimen  -Appreciate GI and gen surg consult    #COVID Positive. Risk factors include ongoing smoking and h/o emphysema  Symptom onset: NA  Test positive: 2/2  Admit: 2/2  -Pt asymptomatic, not requiring oxygen with no infiltrate on CXR. No COVID specific therapies indicated. Will continue to monitor    #Palpitations with eating  -Place on tele to monitor while eating    #Acute on Chronic Anemia. Borderline microcytic  -Will send iron studies, retic, B12, Folate    #Transaminitis. New. Continue to monitor. F/u hep panel    #COPD. Advair BID, ipratropium-albuterol MDI prn #Tobacco use. Nicotine patch. 5 minutes spent on smoking cessation counseling. Pt has been cutting down, not ready to quit    #Chronic pain on chronic narcotics, POA  #Fibromyalgia  - Tylenol PO  - Methocarbamol, cyclobenzaprine prn  - pt reported pain regimen: oxy 5mg  q6h mod pain, hydromorphone 2mg  q6h prn for severe pain  - IV dilaudid for BTP    #Depression/Anxiety. Seroquel. Clarify if patient taking fluoxetine  #GERD/Gastritis. PPI, Sucralfate    Anxiety, Depression, Emphysema, unspecified (HCC/RAF), Fibromyalgia, Gastritis, GERD (gastroesophageal reflux disease), and Pancreatitis.    Inpatient Checklist  Orders Placed This Encounter      Diet mechanical soft  Dietary nutrition supplements Breakfast, Lunch, Dinner; Boost Glucose Control Chocolate, Boost Breeze [employer], Boost Glucose Control Vanilla, Boost Pudding Chocolate    DVT Prophylaxis: enoxaparin  GI Prophylaxis: indicated due to patient is on chronic acid suppression therapy as an outpatient: Proton pump inhibitor  Central Lines: none  Tubes/Drains: none    Disposition  ? Expected date of discharge: < 3 days  ? [place] status: [place] [employer]  ? Dispo destination: Home  ? DC medication needs: NA  ? DME needs: None    Code Status  Full Code,  Primary Emergency Contact: [name],[name], Home Phone: [phone]    Author  Ozzie Hoyle, MD  01/24/2020 at 3:42 PM

## 2020-01-24 NOTE — Progress Notes
PATIENT:Donna Gross  ZOX:[id number]  DOB:Sep 16, 1966  DATE OF SERVICE:01/24/2020    CC: Abdominal pain, regurgitation, dysphagia    HPI:  Donna Gross is a 54 y.o. female with a history of refractory duodenal ulcer. She had a prior exploratory laparotomy and Donna Gross patch, prior laparotomy for colonic fistula, prior diverting ileostomy, prior abdominal wall component separation. We found a posterior perforated duodenal ulcer with perforation into the head of the pancreas. We performed a partial gastrectomy, Billroth-II reconstruction. We Kocherized the duodenum and hepatic flexure. We performed extensive adhesiolysis after performing a vagotomy and antrectomy. She recovered from this operation. She, over the past few months, thought, has dealt with her issues of chronic pain. She, however, has had also some dysphagia. She reports that she is losing weight and although food goes down and she has bowel movements and she does not have any significant digestive issues or pain, she feels like food hangs up in her upper esophagus in the upper esophageal sphincter pharyngeal region.     Interval Events;  01/24/20; Took in 900cc po clears. Awaiting burger study today.    Past Medical History:  Past Medical History:   Diagnosis Date   ? Anxiety    ? Depression    ? Emphysema, unspecified (HCC/RAF)    ? Fibromyalgia    ? Gastritis    ? GERD (gastroesophageal reflux disease)    ? Pancreatitis         Past Surgical History:  Past Surgical History:   Procedure Laterality Date   ? ABDOMINAL SURGERY     ? COLON SURGERY          Medications:    Current Facility-Administered Medications:   ?  aluminum-magnesium hydroxide-simethicone 400-400-40 mg/5 mL susp 15 mL, 15 mL, Oral, Q6H PRN, Belachew, Wossen, MD  ?  cyclobenzaprine tab 10 mg, 10 mg, Oral, BID PRN, Belachew, Wossen, MD  ?  dextrose 5%/0.9% NaCl IV soln, 100 mL/hr, Intravenous, Continuous, Belachew, Wossen, MD, Last Rate: 100 mL/hr at 01/24/20 0540, 100 mL/hr at 01/24/20 0540 ?  dicyclomine cap 10 mg, 10 mg, Oral, BID, Belachew, Wossen, MD, 10 mg at 01/24/20 0839  ?  diphenhydrAMINE cap 50 mg, 50 mg, Oral, Q6H PRN, Belachew, Wossen, MD, 50 mg at 01/23/20 1552  ?  enoxaparin 40 mg/0.4 mL inj 40 mg, 40 mg, Subcutaneous, Daily, Belachew, Wossen, MD, 40 mg at 01/24/20 0826  ?  fluticasone-salmeterol 250-50 mcg/dose diskus 1 puff, 1 puff, Inhalation, BID PRN, Belachew, Wossen, MD  ?  HYDROmorphone 1 mg/mL inj 0.2 mg, 0.2 mg, IV Push, Q6H PRN, Belachew, Wossen, MD  ?  HYDROmorphone tab 2 mg, 2 mg, Oral, Q6H PRN, Bridgette Habermann, MD, 2 mg at 01/24/20 0839  ?  ipratropium-albuterol 20-100 mcg/act inh 1 puff, 1 puff, Inhalation, QID PRN, Belachew, Wossen, MD  ?  lidocaine 5% patch 1 patch, 1 patch, Transdermal, Q24H, Bridgette Habermann, MD, Stopped at 01/24/20 0528  ?  methocarbamol tab 500 mg, 500 mg, Oral, TID, Belachew, Wossen, MD, 500 mg at 01/24/20 0524  ?  nicotine 14 mg/24 hr patch 14 mg, 1 patch, Transdermal, Daily, Belachew, Wossen, MD, 14 mg at 01/24/20 0981  ?  ondansetron ODT tab disolv 4 mg, 4 mg, Oral, Q6H PRN **OR** ondansetron 4 mg/2 mL inj 4 mg, 4 mg, Intravenous, Q6H PRN, Belachew, Wossen, MD, 4 mg at 01/23/20 2002  ?  oxyCODONE tab 5 mg, 5 mg, Oral, Q6H PRN, Bridgette Habermann, MD, 5 mg at 01/24/20 0552  ?  pantoprazole DR tab 40 mg, 40 mg, Oral, BID, Belachew, Wossen, MD, 40 mg at 01/24/20 1610  ?  QUEtiapine tab 100 mg, 100 mg, Oral, BID, Belachew, Wossen, MD, 100 mg at 01/24/20 9604  ?  sucralfate 1 g/10 mL susp 1 g, 1 g, Oral, TID w/meals & QHS, Belachew, Wossen, MD, 1 g at 01/24/20 5409    Allergies:  Hydrocodone, Ibuprofen, and Morphine     Family History  Family History   Problem Relation Age of Onset   ? Heart disease Mother    ? Diabetes Brother    ? Diabetes Father    ? Anesthesia problems Neg Hx    ? Malignant hypertension Neg Hx    ? Hypotension Neg Hx    ? Malignant hyperthermia Neg Hx    ? Pseudochol deficiency Neg Hx         Social History:  Social History Socioeconomic History   ? Marital status: Single     Spouse name: Not on file   ? Number of children: Not on file   ? Years of education: Not on file   ? Highest education level: Not on file   Occupational History   ? Not on file   Social Needs   ? Financial resource strain: Not on file   ? Food insecurity     Worry: Not on file     Inability: Not on file   ? Transportation needs     Medical: Not on file     Non-medical: Not on file   Tobacco Use   ? Smoking status: Current Every Day Smoker     Packs/day: 0.25     Types: Cigarettes   ? Smokeless tobacco: Current User   ? Tobacco comment: 30 years    Substance and Sexual Activity   ? Alcohol use: No   ? Drug use: No   ? Sexual activity: Never   Lifestyle   ? Physical activity     Days per week: Not on file     Minutes per session: Not on file   ? Stress: Not on file   Relationships   ? Social Wellsite geologist on phone: Not on file     Gets together: Not on file     Attends religious service: Not on file     Active member of club or organization: Not on file     Attends meetings of clubs or organizations: Not on file     Relationship status: Not on file   Other Topics Concern   ? Not on file   Social History Narrative   ? Not on file        Review of Systems:  Pertinent items are noted in HPI.    Vitals:  Last Recorded Vital Signs:    01/24/20 0845   BP: 111/61   Pulse: 65   Resp: 16   Temp: 36.4 ?C (97.5 ?F)   SpO2: 95%       Physical Exam:  GENERAL: Well appearing in no apparent distress  RESP: Breathing comfortably on room air  ABD: Abdomen is soft, non distended, mildly tender without rebound or guarding  EXT: Extremities warm and well perfused without edema.    Labs:  BMP  Recent Labs     01/23/20  0828   NA 141   K 4.6   CL 109*   CO2 21   BUN 16   CREAT 1.15   GLUCOSE  40*   CALCIUM 9.4   MG 1.5   PHOS 3.8     LFT  Recent Labs     01/23/20  0828   TOTPRO 7.2   ALBUMIN 4.4   BILITOT <0.2   BILICON <0.2   ALT 18   AST 26   ALKPHOS 112   AMYLASE 117 LIPASE 55     CBC  Recent Labs     01/23/20  0828   WBC 6.22   HGB 9.1*   HCT 30.5*   MCV 81.8   PLT 480*     Coags  Recent Labs     01/23/20  0828   INR 0.9   PT 12.0       Imaging:  [place] abd right upper quadrant portable   Final Result by Olene Floss., MD (02/02 1400)   IMPRESSION:   1.  As compared to the CT of 08/09/2019, common bile duct distention appears more pronounced, wall gallbladder distention and mild pancreatic ductal enlargement do not appear significantly changed.   2.  A small amount of nonobstructive sludge layers in the gallbladder neck. No visible debris is seen within the common bile duct, however the distal duct is not well-visualized.      Signed by: Rubye Oaks   01/23/2020 2:00 PM      XR abdomen ap+supine+erect (2 views)   Final Result by Olene Floss., MD (02/02 1242)   IMPRESSION:     Normal examination               Signed by: Rubye Oaks   01/23/2020 12:42 PM      XR chest ap portable (1 view)   Final Result by Olene Floss., MD [phone])   IMPRESSION:        A streaky left basilar opacity is favored to be atelectatic.            Signed by: Rubye Oaks   01/23/2020 9:43 AM           Microbiology:  Recent Results (from the past 120 hour(s))   Expedited COVID-19 and Influenza A B PCR, Respiratory Upper    Collection Time: 01/23/20 12:46 PM    Specimen: Nasopharyngeal; Respiratory, Upper   Result Value Ref Range    Specimen Type Respiratory, Upper     COVID-19 PCR Detected (A) Not Detected    Influenza A PCR Not Detected Not Detected    Influenza B PCR Not Detected Not Detected       Assessment/Plan:  Donna Gross is a 54 y.o. female with a history of peptic ulcer?disease s/p multiple surgeries,?now?s/p laparoscopic vagotomy and antrectomy, gastrojejunostomy, and hiatal hernia repair (06/21/19) Recommend esophagus burger study, this was ordered to be completed as an outpatient, but can have it performed inpatient to expedite workup.  GI consult for endoscopy.    Author:    Madie Reno, MD  PGY-5 ~ General Surgery  01/24/2020 9:23 AM

## 2020-01-24 NOTE — Other
Patients Clinical Goal:   Clinical Goal(s) for the Shift: VSS, Pain, NV Control, NPO after MN   Identify possible barriers to advancing the care plan: none  Stability of the patient: Moderately Stable - low risk of patient condition declining or worsening   End of Shift Summary:     Patient stable overnight on room air, VSS. Complains of nausea managed with Zofran 4 mg IV x 1 dose. No emesis this shift. Tolerated a clear liquid diet with crackers. NPO except medications since midnight for a planned esophagus burger study. D5NS infusing at 100 mL/hr. Complains of abdomen/chest/throat pains managed with scheduled Robaxin, Tylenol, and Lidocaine patch. Patient slept for most of the night. Had Oxycodone 5 mg this am. No acute changes or events overnight. BP 104/63  ~ Pulse 69  ~ Temp 36.4 C (97.5 F) (Oral)  ~ Resp 18  ~ Wt 61.2 kg (135 lb)  ~ SpO2 97%  ~ BMI 23.17 kg/m

## 2020-01-25 ENCOUNTER — Ambulatory Visit: Payer: PRIVATE HEALTH INSURANCE

## 2020-01-25 LAB — Glucose,POC
GLUCOSE,POC: 107 mg/dL — ABNORMAL HIGH (ref 65–99)
GLUCOSE,POC: 101 mg/dL — ABNORMAL HIGH (ref 65–99)
GLUCOSE,POC: 100 mg/dL — ABNORMAL HIGH (ref 65–99)
GLUCOSE,POC: 128 mg/dL — ABNORMAL HIGH (ref 65–99)

## 2020-01-25 LAB — Ferritin
FERRITIN: 21 ng/mL (ref 8–180)
FERRITIN: 25 ng/mL (ref 8–180)

## 2020-01-25 LAB — CBC
HEMOGLOBIN: 7.6 g/dL — ABNORMAL LOW (ref 11.6–15.2)
HEMOGLOBIN: 8.3 g/dL — ABNORMAL LOW (ref 11.6–15.2)

## 2020-01-25 LAB — Folate,Serum: FOLATE,SERUM: 15.5 ng/mL (ref 8.1–30.4)

## 2020-01-25 LAB — Comprehensive Metabolic Panel
ASPARTATE AMINOTRANSFERASE: 203 U/L — ABNORMAL HIGH (ref 13–47)
UREA NITROGEN: 10 mg/dL (ref 7–22)

## 2020-01-25 LAB — CK, Total: CREATINE PHOSPHOKINASE, TOTAL: 96 U/L (ref 38–282)

## 2020-01-25 LAB — Iron & Iron Binding Capacity: IRON BINDING CAPACITY: 466 ug/dL (ref 262–502)

## 2020-01-25 LAB — Vitamin B12: VITAMIN B12: 301 pg/mL (ref 254–1060)

## 2020-01-25 LAB — COVID-19, Influenza A/B, RSV PCR: RSV PCR: NOT DETECTED

## 2020-01-25 LAB — Differential Automated: ABSOLUTE IMMATURE GRAN COUNT: 0.01 10*3/uL (ref 0.00–0.04)

## 2020-01-25 LAB — Acute Hepatitis Panel: HEPATITIS B SURFACE ANTIGEN: NONREACTIVE

## 2020-01-25 LAB — Reticulocyte Count: RETICULOCYTE COUNT, AUTO: 1.39 (ref 2.6–15.8)

## 2020-01-25 MED ADMIN — METOCLOPRAMIDE HCL 5 MG/ML IJ SOLN: 10 mg | INTRAVENOUS | @ 02:00:00 | Stop: 2020-02-24 | NDC 00409341401

## 2020-01-25 MED ADMIN — TRAZODONE HCL 100 MG PO TABS: 100 mg | ORAL | @ 08:00:00 | Stop: 2020-02-24 | NDC 60687045401

## 2020-01-25 MED ADMIN — OXYCODONE HCL 5 MG PO TABS: 5 mg | ORAL | @ 06:00:00 | Stop: 2020-01-31 | NDC 00406055262

## 2020-01-25 MED ADMIN — SODIUM CHLORIDE 0.9 % IV BOLUS: 1000 mL | INTRAVENOUS | @ 08:00:00 | Stop: 2021-01-24 | NDC 00338004904

## 2020-01-25 MED ADMIN — CYANOCOBALAMIN 1000 MCG/ML IJ SOLN: 1000 ug | INTRAMUSCULAR | @ 20:00:00 | Stop: 2020-02-29 | NDC 00517003125

## 2020-01-25 MED ADMIN — SOD BICARB-CITRIC AC-SIMETH 2.21-1.53-0.04 G PO PACK: ORAL | @ 20:00:00 | Stop: 2020-01-25 | NDC 10361079301

## 2020-01-25 MED ADMIN — BARIUM SULFATE 98 % PO SUSR: 135 mL | ORAL | @ 20:00:00 | Stop: 2020-01-25 | NDC 32909076401

## 2020-01-25 MED ADMIN — DEXTROSE 5 %/0.45 % NACL: 75 mL/h | INTRAVENOUS | @ 01:00:00 | Stop: 2020-01-27 | NDC 00338008504

## 2020-01-25 MED ADMIN — PANTOPRAZOLE SODIUM 40 MG PO TBEC: 40 mg | ORAL | @ 04:00:00 | Stop: 2020-01-31 | NDC 50268063915

## 2020-01-25 NOTE — Other
Patients Clinical Goal:   Clinical Goal(s) for the Shift: VSS, Pain Control, N/V Management, NPO after MN   Identify possible barriers to advancing the care plan: none  Stability of the patient: Moderately Unstable - medium risk of patient condition declining or worsening    End of Shift Summary:     Patient remained stable overnight. Slight hypotensive 85/56 (66), s/p 1L NS, 94/60 (72), asymptomatic. Afebrile. Remains on room air. Complains of nausea managed with scheduled Reglan Q8H and Zofran PRN. Continues to have abdomen pain managed with Oxycodone 5 mg X 2, and scheduled Robaxin, and heat therapy. Made NPO except meds at midnight for a planned burger study/barium swallow. D5 1/2 NS infusing at 75 mL/hr. No acute changes or events overnight. BP 124/68  ~ Pulse 56  ~ Temp 36.3 C (97.3 F) (Axillary)  ~ Resp 18  ~ Wt 61.2 kg (135 lb)  ~ SpO2 97%  ~ BMI 23.17 kg/m

## 2020-01-25 NOTE — Progress Notes
Pharmaceutical Services  Admission Medication Reconciliation Note      Patient Name: Donna Gross  Medical Record Number: [id number]  Admit date:        Age: 54 y.o.  Sex: female  Allergies: Hydrocodone, Ibuprofen, and Morphine  Height:   Most recent documented height   11/24/19 1.626 m (5' 4'')     Actual Weight:   Most recent documented weight   01/24/20 61.2 kg   11/24/19 55.3 kg     Diagnosis: The patient is currently admitted with the following concerns/issues: Active Problems:    Intractable nausea and vomiting POA: Yes      Reported Medication History   I spoke with the patient and utilized outside records activity to update the home medication list for this hospital admission.    PTA Medication List (discrepancies are noted)   Prior to Admission medications as of 01/25/20 0856   Medication Sig Notes Last Dose   albuterol (2.5 mg/60mL) 0.083% nebulizer solution 2.5 mg, Nebulization, Every 8 hours PRN  Continued on Combivent    albuterol 90 mcg/act inhaler 2 puffs, Inhalation, Every 8 hours PRN  continued    aluminum-magnesium hydroxide-simethicone 400-400-40 mg/5 mL suspension Take 10 mLs by mouth every four (4) hours as needed for Indigestion .  continued    clobetasol 0.05% cream Topical, 3 times daily PRN  PRN med not reordered    FLUoxetine 40 mg capsule 40 mg, Oral, Daily  continued    fluticasone-salmeterol (WIXELA INHUB) 250-50 mcg/dose diskus 1 puff, Inhalation, 2 times daily PRN  continued    HYDROcodone-acetaminophen 5-325 mg tablet 1 tablet, Oral, Every 6 hours PRN  Pain managed with hydromorphone and oxycodone     ipratropium-albuterol 20-100 mcg/act inhaler 2 puffs, Inhalation, 3 times daily PRN  Continued as QID    lidocaine viscous 2% oral solution 5 mLs, Swish & Spit, Every 4 hours PRN  Not reordered - please reorder if needed Naloxone HCl 4 MG/0.1ML LIQD Call 911. Administer a single spray intranasally into one nostril for opioid overdose. May repeat in 3 minutes if patient is not breathing. PRN med not reordered    ondansetron 4 mg tablet 4 mg, Oral, Every 6 hours PRN continued    pantoprazole 40 mg DR tablet 40 mg, Oral, Daily  continued    promethazine 6.25 mg/5 mL solution 6.25 mg, Oral, Every 6 hours PRN  PRN med not reordered    quetiapine 100 mg tablet 100 mg, Oral, 2 times daily  continued    traZODone 100 mg tablet 100 mg, Oral, Every night at bedtime  continued    sucralfate 1 g/10 mL suspension 1 g, Oral, 3 times daily with meals & at bedtime  continued          Discharge Prescription Preference:   [facility] [place], [place] - [phone]  [phone] [place] [address]    Medication Changes   The following medication changes were made when updating the medication list and are included in above PTA home medication list:  Removed Almacone (pt using max strength instead)   Removed cyclobenzaprine (per pt not taking )   Removed dicyclomine (per pt, not taking)  Removed hydromorphone 2mg  (old rx, not found on recent CURES)  Removed lansoprazole (taking pantoprazole)   Removed lidocaine 5% patch (pt does not use, last filled 06/2019)  Removed menthol lozenges (pt does not use, last filled 06/2019)   Removed oxycodone 5mg  (  old rx, not found on recent CURES)   Removed oxycodone-acetaminophen (old rx, 5 days supply in 09/2019)  Added fluoxetine 40mg  daily (Added per pt)   Added trazodone 100mg  daily at bedtime (Added per pt)     Reconciliation  The assessment and reconciliation of admission and inpatient orders with PTA medication list is complete. Consider re-ordering the following home medications which differ from inpatient orders if clinically indicated:  Discontinue cyclobenzaprine 10mg  BID PRN muscle spasm - pt not using PTA, and has not needed during hospital stay Regarding patient's request for ''benzodiazepines'', we have confirmed outside pharmacy record and CURES that pt did not take any benzodiazepines PTA. The only medications that provide anxiolytics effects are trazodone, quetiapine and fluoxetine, which all of them have been resumed upon admission.       Lilyan Punt, CPhT   Medication Reconciliation Pharmacy Technician   [phone]    Giavanni Zeitlin Freddi Che) Albin Felling, PharmD. BCPS  Clinical Pharmacist, Med Surg  Ext: [phone]

## 2020-01-25 NOTE — Consults
NVG, EGD tomorrow, dc weekend, TBD

## 2020-01-25 NOTE — Consults
NUTRITION ASSESSMENT (Adult)    Admit Date: 01/24/2020     Date of Birth: Feb 27, 1966 Gender: female MRN: [id number]     Date of Assessment: 01/25/2020   Status: Assessment   Indication: Uncontrolled nausea/vomiting for 4 days or more   Subjective: Pt reports no chewing difficulty, she explains when she swallows the food it feels stuck in her esophagus. Afterwards she will cough and vomit the food. This began after her surgery in July 2020. She's able to tolerate water/liquids. She did not consume oral supplements PTA and has not tried any in-house yet.  During that time she was 142-145lb then lost wt to the 120s lb, but she's uncertain of the timeframe.    Problems: Active Problems:    Intractable nausea and vomiting POA: Yes    Dysphagia POA: Yes       Per MD note 2/02: Donna Gross is a 54 y.o. female with history of complex peptic ulcer disease s/p multiple surgeries, last in July 2020 (laparoscopic vagotomy and antrectomy, gastrojejunostomy, and hiatal hernia repair c/b ongoing nausea, vomiting, dysphagia, poor po tolerance for which follows with general surgery  (last seen 12/20) who presents for same found to be hypoglycemic to 40 and COVID positive.    Past Medical History:   Diagnosis Date   ? Anxiety    ? Depression    ? Emphysema, unspecified (HCC/RAF)    ? Fibromyalgia    ? Gastritis    ? GERD (gastroesophageal reflux disease)    ? Pancreatitis     Past Surgical History:   Procedure Laterality Date   ? ABDOMINAL SURGERY     ? COLON SURGERY             Data   Intake/Outputs: I/O last 2 completed shifts:  In: 2836.3 [P.O.:840; I.V.:996.3; IV Piggyback:1000]  Out: -     Pertinent Medications:   Scheduled Meds:  ? cyanocobalamin  1,000 mcg Intramuscular Q7 Days   ? dicyclomine  10 mg Oral BID   ? enoxaparin  40 mg Subcutaneous Daily   ? FLUoxetine  40 mg Oral Daily   ? lidocaine  1 patch Transdermal Q24H   ? methocarbamol  500 mg Oral TID   ? metoclopramide  10 mg IV Push TID AC ? nicotine  1 patch Transdermal Daily   ? pantoprazole  40 mg Oral BID   ? QUEtiapine  100 mg Oral BID   ? sucralfate  1 g Oral TID w/meals & QHS   ? traZODone  100 mg Oral QHS     Continuous Infusions:  ? dextrose 5%/0.45% NaCl 75 mL/hr (01/25/20 0809)     PRN Meds:.acetaminophen, aluminum-magnesium hydroxide-simethicone, cyclobenzaprine, diphenhydrAMINE, fluticasone-salmeterol, HYDROmorphone, HYDROmorphone, ipratropium-albuterol, melatonin, ondansetron ODT **OR** ondansetron injection/IVPB, oxyCODONE     FDI Target Drugs: No      Pertinent Labs:   Recent Labs     01/25/20  0717 01/24/20  1407 01/24/20  1407 01/24/20  1406 01/23/20  0828 01/23/20  0828   NA 140  --   --  142  --  141   K 4.7  --   --  4.6  --  4.6   BUN 10  --   --  7  --  16   CREAT 1.06  --   --  1.05  --  1.15   GLUCOSE 88  --   --  83  --  40*   MG  --   --   --   --   --  1.5   CALCIUM 8.4*  --   --  8.8  --  9.4   PHOS  --   --   --   --   --  3.8   ALBUMIN 3.6*  --   --  3.6*   < > 4.4   CRP  --   --  <0.3  --   --   --    WBC 6.60   < > 5.77  --   --  6.22   HGB 8.3*   < > 7.6*  --   --  9.1*   HCT 28.6*   < > 25.3*  --   --  30.5*   FE  --   --   --   --   --  20*   VITAMINB12  --   --   --  301  --   --    BILITOT <0.2  --   --  0.2  --  <0.2   AST 203*  --   --  82*  --  26   ALT 145*  --   --  67*  --  18   ALKPHOS 115*  --   --  90  --  112   LIPASE  --   --   --   --   --  55   AMYLASE  --   --   --   --   --  117    < > = values in this interval not displayed.     01/24/20: vitamin B12 301(wnl)     Accu-Chek:   Recent Labs     01/23/20  2139 01/24/20  0523 01/24/20  1229 01/24/20  1724 01/24/20  2129 01/25/20  0537   GLUCOSEPOC 108* 108* 87 107* 101* 100*       Respiratory Status / O2 Device: None (Room air)    Pressure Injury:   none    Additional data:  N/A     Diet Info   ? Allergies:   Hydrocodone, Ibuprofen, and Morphine  ? Cultural/Ethnic/Religious/Other Food Preferences:  None ? Nutrition prior to admit:  Able to tolerate liquids, difficulty tolerating solids. Poor PO intake.  ? Current diet order:     Diets/Supplements/Feeds   Diet    Diet NPO Except for: [employer] and Meds     Start Date/Time: 01/25/20 0010      Number of Occurrences: Until Specified     Order Questions:     ? Except for Ice Chips and Meds   Nourishments    Dietary nutrition supplements Breakfast, Lunch, Dinner; Boost Glucose Control Chocolate, Boost Breeze [place], Boost Glucose Control [place], Boost Pudding Chocolate     Start Date/Time: 01/24/20 1550      Number of Occurrences: Until Specified     Order Questions:     ? Meal: Breakfast     ? Meal: Lunch     ? Meal: Dinner     ? Supplement: Boost Glucose Control Chocolate     ? Supplement: Boost Breeze [employer]     ? Supplement: Boost Glucose Control Vanilla     ? Supplement: Boost Pudding Chocolate     ? PO % consumed: NPO  ? Parenteral Nutrition: N/A  ? Enteral Nutrition: N/A  ? Other caloric sources: D5 NS @ 73ml/hr provides 306kcal/day    Anthropometrics:  Height:    Admit Weight: 59.4 kg (131 lb) (01/23/20 1617) Last  5 recorded weights:  Weights 08/09/2019 08/09/2019 11/24/2019 01/23/2020 01/24/2020   Weight 64 kg 63.5 kg 55.3 kg 59.4 kg 61.2 kg            IBW: 54.4 kg (120 lb)  % Ideal Body Weight: 113 %       Usual Weight: 65.8 kg (145 lb)(Per pt report.)  % Usual Weight: 93 %     Wt Readings from Last 15 Encounters:   01/24/20 61.2 kg (135 lb)   11/24/19 55.3 kg (122 lb)   08/09/19 63.5 kg (140 lb)   08/09/19 64 kg (141 lb)   07/12/19 62.1 kg (137 lb)   06/30/19 62.4 kg (137 lb 9.6 oz)   06/02/19 64 kg (141 lb 1.6 oz)   03/09/19 59.4 kg (131 lb)   03/09/19 59.4 kg (131 lb)   12/26/15 49.9 kg (110 lb)      Estimated Nutrition Needs   1836 kcal/day (30 kcal/kg)  73-86g/day protein (1.2-1.4g/kg)  Based on wt 61.2kg     Diet Education   Not appropriate at present   Pt NPO.        Malnutrition Assessment Malnutrition in the Context of: Chronic Illness/Mild-Moderate Inflammation   Energy Intake: < or = 75% of estimated energy requirement for > or =1 month  Weight Loss: Unable to assess    Nutrition-Focused Physical Exam: 01/25/2020  Not performed: in an effort to limit contact and mitigate the spread of COVID-19.        Patient meets criteria for: Unable to complete Malnutrition assessment at this time    Nutrition Assessment   Anthropometrics: Body mass index is 23.17 kg/m?Marland Kitchen Normal. Pt reports ~20lb wt loss but unable to specify timeframe. Wts per EMR are variable.    Nutrition: NPO. Pt order for esophageal burger study.    Tolerance/GI: Per RN flowsheet: Abdomen Inspection: Nondistended. Pt complains of intractable vomiting.    Labs: Elevated LFTs    Meds: cyanocobalamin, reglan, D5 NS @ 71ml/hr provides 306kcal/day     Recommendations / Care Plan      -When medically feasible, recommend advance to regular diet.  -Recommend add multivitamin with minerals to ensure micronutrient adequacy.   -Recommend d/c Boost Glucose Control. Add Ensure Enlive (350kcal and 20g protein per serving) x3/day for greater kcal/protein. Continue trial of Boost Pudding upon diet advancement.    RD to follow.    Author:  Sherley Bounds, RD, pager [phone]   01/25/2020 10:38 AM

## 2020-01-25 NOTE — Progress Notes
Inpatient Gastroenterology Progress Note    Date of service: 01/25/2020  Primary attending: Ozzie Hoyle., MD    Reason for consult: oropharyngeal dysphagia    Interval Events:  - switched to mechanical soft diet  - seen by nutrition who recommended Ensure supplementation  - burger esophagram unrevealing    Subjective:  Had the sensation of food getting stuck in the upper throat during the burger esophagram.    14 point ROS performed with pertinent positives/negatives listed above.    Medications:  cyanocobalamin, 1,000 mcg, Intramuscular, Q7 Days  dicyclomine, 10 mg, Oral, BID  enoxaparin, 40 mg, Subcutaneous, Daily  ferric gluconate, 125 mg, Intravenous, Daily  FLUoxetine, 40 mg, Oral, Daily  lidocaine, 1 patch, Transdermal, Q24H  methocarbamol, 500 mg, Oral, TID  metoclopramide, 10 mg, IV Push, TID AC  nicotine, 1 patch, Transdermal, Daily  pantoprazole, 40 mg, Oral, BID  QUEtiapine, 100 mg, Oral, BID  sucralfate, 1 g, Oral, TID w/meals & QHS  traZODone, 100 mg, Oral, QHS  ? dextrose 5%/0.45% NaCl 75 mL/hr (01/25/20 0809)       PRN medications: acetaminophen, aluminum-magnesium hydroxide-simethicone, cyclobenzaprine, diphenhydrAMINE, fluticasone-salmeterol, HYDROmorphone, HYDROmorphone, ipratropium-albuterol, melatonin, ondansetron ODT **OR** ondansetron injection/IVPB, oxyCODONE Diet: Dietary nutrition supplements Breakfast, Lunch, Dinner; Boost Glucose Control Chocolate, Boost Breeze [employer], Boost Glucose Control Vanilla, Boost Pudding Chocolate  Diet mechanical soft    Allergies: Hydrocodone, Ibuprofen, and Morphine     Objective:  Vitals:  Temp:  [35.9 ?C (96.7 ?F)-37 ?C (98.6 ?F)] 35.9 ?C (96.7 ?F)   BP: (85-124)/(56-73) 97/66   Heart Rate:  [56-84] 72   Resp:  [16-18] 16   SpO2:  [93 %-97 %] 97 %  I/O last 3 completed shifts:  In: 4822.9 [P.O.:1320; I.V.:2502.9; IV Piggyback:1000]  Out: 1500 [Urine:1500]     General: NAD, well-appearing  HEENT: anicteric sclera, MMM, no thrush Pulm: normal respiratory effort  Abd: soft, diffuse mild TTP, nondistended, no rebound or guarding  Ext: no peripheral edema, no clubbing or cyanosis   Neuro: alert and oriented, no focal neurologic deficits  Skin: wwp, no rashes    Labs:  Recent Labs     01/25/20  0717 01/25/20  0008 01/24/20  1407 01/24/20  1406 01/23/20  0828   WBC 6.60 8.36 5.77  --  6.22   HGB 8.3* 7.6* 7.6*  --  9.1*   HCT 28.6* 26.0* 25.3*  --  30.5*   MCV 83.6 82.0 79.8  --  81.8   PLT 368 376 413*  --  480*   NA 140  --   --  142 141   K 4.7  --   --  4.6 4.6   CL 109*  --   --  109* 109*   CO2 22  --   --  23 21   BUN 10  --   --  7 16   CREAT 1.06  --   --  1.05 1.15   ALBUMIN 3.6*  --   --  3.6* 4.4   BILITOT <0.2  --   --  0.2 <0.2   ALKPHOS 115*  --   --  90 112   AST 203*  --   --  82* 26   ALT 145*  --   --  67* 18   INR  --   --   --   --  0.9       Radiology:  CT Neck 06/2019  IMPRESSION:  No neck mass or  cervical lymphadenopathy.   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   Partially imaged nonspecific right mastoid effusion.   ?  Esophagram 06/2019  IMPRESSION:   Status post gastrojejunostomy and hiatal hernia repair with normal esophageal motility and transit of contrast into the gastric remnant and jejunum.  ?  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 Nondilated bowel with no wall thickening Normal appendix.   IMPRESSION:  1. ?No evidence of bowel obstruction.  2. ?Stable gallbladder distention with trace pericholecystic fluid. Stable mild central intrahepatic biliary dilatation.  ?  Abdominal US 01/23/20  IMPRESSION:  1. ?As compared to the CT of 08/09/2019, common bile duct distention appears more pronounced, wall gallbladder distention and mild pancreatic ductal enlargement do not appear significantly changed. 2. ?A small amount of nonobstructive sludge layers in the gallbladder neck. No visible debris is seen within the common bile duct, however the distal duct is not well-visualized.    Esophagus Donna Gross 01/25/20  IMPRESSION:    Ingestion of barium contrast demonstrated normal motility without stasis. Adherent remnant barium sandwich coated the esophagus, which cleared easily following ingestion of water. Small hiatal hernia present.  ?  Endoscopy:  EGD 01/2019        Impression:  Donna Gross is a 54 y.o. female with h/o refractory and perforated duodenal ulcer s/p transverse colectomy, colocutaneous fistula and diverting ileostomy and more recent lap vagotomy, partial gastrectomy, antrectomy, Billroth II reconstruction, and hiatal hernia repair (06/2019) admitted with N/V and dysphagia  ?  # oropharyngeal dysphagia  # N/V  # aberrant right subclavian artery  # s/p vagotomy  Pt p/w throat/upper esophageal dysphagia to solids only with associated N/V after eating that started after vagotomy, partial gastrectomy, antrectomy and hiatal hernia repair in 06/2019. Also found to have an aberrant right subclavian artery with a retroesophageal course, which is a rare cause of dysphagia known as dysphagia lusoria, although this extrinsic compression was not seen on burger esophagram. DDx also includes esophageal dysmotility or gastroparesis 2/2 vagotomy or further vagal nerve injury or anastomotic ulcer(s) or inflammation causing obstruction, although the latter less likely with unremarkable imaging. Also consideration is functional dysphagia given symptoms during relatively normal burger esophagram, although this is a diagnosis of exclusion. Plan for EGD tomorrow to evaluate for structural esophageal causes, although unlikely to be revealing.    Recommendations:  ? EGD tomorrow  ? NPO at midnight  ? Ensure Enlive TIDWM  ? PPI IV BID  ? Trial of reglan 10mg  IV TID before meals ? We will continue to follow. Please call if questions.    Recommendations discussed with primary team. Patient seen and discussed with my attending Dr. Elita Boone who agrees with above impressions and recommendations.     Donna Gross. Agnes Lawrence, MD  GI Fellow, [phone] (if after 5pm or weekend, page on-call GI fellow)  01/25/2020 3:27 PM

## 2020-01-26 ENCOUNTER — Ambulatory Visit: Payer: PRIVATE HEALTH INSURANCE

## 2020-01-26 ENCOUNTER — Telehealth: Payer: PRIVATE HEALTH INSURANCE

## 2020-01-26 DIAGNOSIS — Z01818 Encounter for other preprocedural examination: Secondary | ICD-10-CM

## 2020-01-26 LAB — Comprehensive Metabolic Panel
ALBUMIN: 3.6 g/dL — ABNORMAL LOW (ref 3.9–5.0)
CHLORIDE: 106 mmol/L (ref 96–106)

## 2020-01-26 LAB — CBC: WHITE BLOOD CELL COUNT: 4.52 10*3/uL (ref 4.16–9.95)

## 2020-01-26 LAB — B-Type Natriuretic Peptide: BNP: 368 pg/mL — ABNORMAL HIGH (ref <100)

## 2020-01-26 LAB — Differential Automated: ABSOLUTE IMMATURE GRAN COUNT: 0.01 10*3/uL (ref 0.00–0.04)

## 2020-01-26 LAB — Lactate Dehydrogenase: LACTATE DEHYDROGENASE: 360 U/L — ABNORMAL HIGH (ref 125–256)

## 2020-01-26 MED ADMIN — FERRIC GLUCONATE IVPB 100 ML: 125 mg | INTRAVENOUS | @ 01:00:00 | Stop: 2020-01-30 | NDC 00338004918

## 2020-01-26 MED ADMIN — GADOBUTROL 1 MMOL/ML IV SOLN: 7.5 mL | INTRAVENOUS | @ 07:00:00 | Stop: 2020-01-26 | NDC 50419032511

## 2020-01-26 MED ADMIN — DEXTROSE 5 %/0.45 % NACL: 75 mL/h | INTRAVENOUS | @ 07:00:00 | Stop: 2020-01-26 | NDC 00338008504

## 2020-01-26 MED ADMIN — HYDROMORPHONE HCL 1 MG/ML IJ SOLN: .2 mg | INTRAVENOUS | @ 04:00:00 | Stop: 2020-01-31 | NDC 00409128331

## 2020-01-26 MED ADMIN — MULTI-VITAMINS PO TABS: 1 | ORAL | @ 01:00:00 | Stop: 2020-02-24 | NDC 00904053961

## 2020-01-26 NOTE — Consults
SPRITUAL CARE CONSULTATION NOTE    PATIENT:  Donna Gross  MRN:  [id number]     Patient Info        Religious/Spiritual Identity:        Catholic       Last Anointed Date:                 Baptised:                 Spiritual Care Visit Details              Date of Visit:  01/26/20  Time of Visit:  1508  Visited with Patient(Phone Visit)   Visit length 15 Minutes   Referral source Self-initiated   Reason for visit Spiritual/Emotional support, Initial visit/assessment      Spiritual Assessment     Spiritual practices & resources Family/Friends   Areas of spiritual/emotional distress Concerns about suffering   Distressful feelings     Indicators of spiritual wellbeing Expresses...   Expressions of spiritual wellbeing Expresses gratitude, Expresses courage      Plan     Spiritual care intervention Blessing offered, Offered words of comfort/encouragement, Introduction to chaplain services, Explored feelings related to present illness   Outcomes (per patient/family) Appreciated visit   Spiritual care plans Continue to visit as needed   Additional comments        Recommendation          Author:  Melinda Crutch 01/26/2020 3:27 PM  Contact info: SM pager: [phone] ext: [phone]

## 2020-01-26 NOTE — Progress Notes
Medicine Progress Note     PMD: [facility], [facility]  DATE OF SERVICE: 01/26/2020  HOSPITAL DAY: 2  CHIEF COMPLAINT: Abdominal Pain (N/V, and CP, stating she had ulcer surgery in july here, and ever since then she is not well, unbale to tolerate POs, throat swells after eating annd hard to breathe )    Last 24 Hour/Overnight Events:   None    Subjective/Review of Systems:   Pt tolerated broth, crackers and Ensure yesterday without significant N/V. Chronic abdominal pain stable. Denies fever, chills, loss of taste, smell, cough, SOB    Medications:   Scheduled:  cyanocobalamin, 1,000 mcg, Intramuscular, Q7 Days  ferric gluconate, 125 mg, Intravenous, Daily  FLUoxetine, 40 mg, Oral, Daily  lidocaine, 1 patch, Transdermal, Q24H  metoclopramide, 10 mg, IV Push, TID AC  multivitamin, 1 tablet, Oral, Daily  nicotine, 1 patch, Transdermal, Daily  pantoprazole, 40 mg, Oral, BID  sucralfate, 1 g, Oral, TID w/meals & QHS  traZODone, 100 mg, Oral, QHS  PRN:  acetaminophen, aluminum-magnesium hydroxide-simethicone, diphenhydrAMINE, fluticasone-salmeterol, HYDROmorphone, ipratropium-albuterol, lidocaine viscous, melatonin, ondansetron ODT **OR** ondansetron injection/IVPB, oxyCODONE  Infusions:      Physical Exam:   Temp:  [35.8 ?C (96.5 ?F)-36.8 ?C (98.3 ?F)] 36 ?C (96.8 ?F)  Heart Rate:  [60-89] 60  Resp:  [16-18] 18  BP: (103-126)/(52-72) 126/70  NBP Mean:  [71-87] 87  SpO2:  [90 %-92 %] 92 %  I/O: I/O last 2 completed shifts:  In: 1247.5 [P.O.:680; I.V.:567.5]  Out: 1900 [Urine:1900]  Const: NAD  Eyes: Sclerae anicteric  ENMT: No oral lesions, MMM  CV: RRR, no murmurs, no edema  Resp: CTAB, no accessory muscle use  GI: Soft, TTP throughout worse in epigastrium. Non distended. normoactive BS. Multiple old scars present  Skin: No rashes or nodules  Psych: A+Ox3    Data:   I have reviewed all of the labs from today. Pertinent labs include:   CBC  Recent Labs     01/26/20  0726 01/25/20  0717 01/25/20  0008 WBC 4.52 6.60 8.36   HGB 7.4* 8.3* 7.6*   HCT 24.7* 28.6* 26.0*   MCV 80.2 83.6 82.0   PLT 368 368 376     BMP  Recent Labs     01/26/20  0726 01/25/20  0717 01/24/20  1406   NA 140 140 142   K 4.5 4.7 4.6   CL 106 109* 109*   CO2 24 22 23    BUN 11 10 7    CREAT 1.06 1.06 1.05   CALCIUM 8.7 8.4* 8.8   Glucose: 80    LFT  Recent Labs     01/26/20  0726 01/25/20  0717 01/24/20  1406   TOTPRO 5.9* 6.0* 5.9*   ALBUMIN 3.6* 3.6* 3.6*   BILITOT <0.2 <0.2 0.2   ALT 232* 145* 67*   AST 288* 203* 82*   ALKPHOS 146* 115* 90     Coags  No results for input(s): INR, PT, APTT in the last 72 hours.    I have reviewed the studies from today. Pertinent study results include:   CXR pending    2/5 MRCP  Mild biliary and pancreatic ductal dilatation, similar in appearance dating back to 2016, without a detectable cause of obstruction.    2/4: Burger esophagram  Esophageal caliber: Normal with no focal narrowing.  Intraluminal filling defects: None.  Esophageal motility: Normal.  Swallowing of barium coated sandwich: Normal with no stasis. There was adherent  remnant barium sandwich coating the esophagus, which cleared rapidly following ingestion of water.  Hiatal hernia: Small.  Gastroesophageal reflux: None observed.  IMPRESSION:    Ingestion of barium contrast demonstrated normal motility without stasis. Adherent remnant barium sandwich coated the esophagus, which cleared easily following ingestion of water. Small hiatal hernia present..    2/2: RUQ [place]  1.  As compared to the CT of 08/09/2019, common bile duct distention appears more pronounced, wall gallbladder distention and mild pancreatic ductal enlargement do not appear significantly changed.  2.  A small amount of nonobstructive sludge layers in the gallbladder neck. No visible debris is seen within the common bile duct, however the distal duct is not well-visualized.    2/2 CXR: Streaky left basilar opacity is favored to be atelectatic    07/12/19: CTneck w contrast No neck mass or cervical lymphadenopathy.   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   Partially imaged nonspecific right mastoid effusion.     I have reviewed the electrocardiogram from today. It showed   2/2: Sinus, HR 63, nl axix, short PR, no ischemic changes  Problem-Based Assessment and Plan:   Donna Gross is a 54 y.o. female with history of emphysema, active smoker and h/o complex peptic ulcer disease s/p multiple surgeries, last in July 2020 (laparoscopic vagotomy and antrectomy, gastrojejunostomy, and hiatal hernia repair) c/b ongoing nausea, vomiting, dysphagia, poor po tolerance presents for N/V PO intolerance and found to be COVID positive.     #Intractable N/V, dysphagia, PO intolerance after multiple abdominal surgeries for peptic ulcer disease. Ddx includes structural causes such as result of hiatal hernia repair, dysphagia lusoria (2/2 aberrant retroesophageal right subclavian, noted by GI team), motility disorder as result of vagotomy, autonomic dysfunction after vagotomy (given sensation of swelling and sweating after taking food), ongoing ulcerative disease/gastritis  -Burger esophagram without obvious structural or motility abnormalities  -Plan for EGD today  -Work up increasing LFTs below  -Appreciate Nutrition consult: MVI, Ensure Enlive  -Mechanical soft diet, ADAT  -Reglan 10mg  IV Q8 for motility, monitor Qtc intermittently  -Continue home GI regimen  -Appreciate GI and gen surg consult    #COVID Positive. Risk factors for severe disease include ongoing smoking and h/o emphysema. Discussed repeat COVID test result negative with microbiology. Likely low level colonization detected on first test. Would not consider first test to be a false positive. Discussed this with patient. Today O2 sat in low 90s, pt still asymptomatic from symptom perspective. LFTs also increasing, possible DILI, but possibly 2/2 COVID Symptom onset: Query 2/5 if hypoxia COVID related  Test positive: 2/2  Admit: 2/2  -Given hypoxia, will work up below. Will resend COVID and inflammatory labs and CXR  -Would consider Dex given hypoxia and convalescent plasma. Would likely withold remdesivir given acutely rising LFTs for now    #Transaminitis. Per GI, suspect Dili. MRCP negative for structural etiology. Also consider COVID however pt otherwise asymptomatic.  -Hep panel negative  -Will DC Seroquel, methocarbamol for now. Can consider stopping fluoxetine temporarily (pt started this over the last two months)    #Mild hypoxia. Query 2/2 COVID. Will also evaluate for other causes such as volume overload, aspiration  -check BNP, repeat CXR, send procal  -TTE if BNP elevated  -Will stop maintenance fluids, can consider trial of gentle diuresis    #Palpitations with eating  -Place on tele to monitor while eating    #Acute on Chronic  Anemia. Iron deficient. B12 low normal, Folate wnl. Hypoproliferative  -f/u MMA, homocysteine  -B12 IM x1 2/4  -Ferric Gluconate 125 IV daily x 5 days (2/4-)    #COPD. Advair BID, ipratropium-albuterol MDI prn    #Tobacco use. Nicotine patch.      #Chronic pain on chronic narcotics, POA  #Fibromyalgia  - Tylenol PO  - oxycodone, dilaudid     #Depression/Anxiety. Hold seroquel. Continue fluoxetine for now  #GERD/Gastritis. PPI, Sucralfate    #Insomnia. Trazodone, melatonin prn    Inpatient Checklist  Orders Placed This Encounter      Diet NPO Except for: Ice Chips and Meds      Dietary nutrition supplements Breakfast, Lunch, Dinner; Ensure [employer], Ensure [employer], Ensure [employer] Strawberry    DVT Prophylaxis: enoxaparin    GI Prophylaxis: indicated due to patient is on chronic acid suppression therapy as an outpatient: Proton pump inhibitor  Central Lines: none  Tubes/Drains: none    Disposition  ? Expected date of discharge: < 3 days  ? Insurance status: [place] [employer] [place] [employer]  ? Dispo destination: Home ? DC medication needs: NA  ? DME needs: None    Code Status  Full Code,  Primary Emergency Contact: [name],[name], Home Phone: [phone]    Author  Ozzie Hoyle, MD  01/26/2020 at 2:20 PM

## 2020-01-26 NOTE — Progress Notes
Medicine Progress Note     PMD: [facility]  DATE OF SERVICE: 01/25/2020  HOSPITAL DAY: 1  CHIEF COMPLAINT: Abdominal Pain (N/V, and CP, stating she had ulcer surgery in july here, and ever since then she is not well, unbale to tolerate POs, throat swells after eating annd hard to breathe )    Last 24 Hour/Overnight Events:   None    Subjective/Review of Systems:   Pt tolerated some PO yesterday with ongoing N/V. Chronic abdominal pain. No fever, chills, loss of taste, smell, myalgias. Discussed discrepant covid results.      Medications:   Scheduled:  cyanocobalamin, 1,000 mcg, Intramuscular, Q7 Days  ferric gluconate, 125 mg, Intravenous, Daily  FLUoxetine, 40 mg, Oral, Daily  lidocaine, 1 patch, Transdermal, Q24H  metoclopramide, 10 mg, IV Push, TID AC  nicotine, 1 patch, Transdermal, Daily  pantoprazole, 40 mg, Oral, BID  sucralfate, 1 g, Oral, TID w/meals & QHS  traZODone, 100 mg, Oral, QHS  PRN:  acetaminophen, aluminum-magnesium hydroxide-simethicone, diphenhydrAMINE, fluticasone-salmeterol, HYDROmorphone, ipratropium-albuterol, lidocaine viscous, melatonin, ondansetron ODT **OR** ondansetron injection/IVPB, oxyCODONE  Infusions:  ? dextrose 5%/0.45% NaCl         Physical Exam:   Temp:  [35.9 ?C (96.7 ?F)-37 ?C (98.6 ?F)] 36.6 ?C (97.8 ?F)  Heart Rate:  [56-84] 78  Resp:  [16-18] 16  BP: (85-124)/(56-73) 104/60  NBP Mean:  [66-85] 74  SpO2:  [92 %-97 %] 92 %  I/O: I/O last 2 completed shifts:  In: 2836.3 [P.O.:840; I.V.:996.3; IV Piggyback:1000]  Out: -   Const: NAD  Eyes: Sclerae anicteric  ENMT: No oral lesions, MMM  CV: RRR, no murmurs, no edema  Resp: CTAB, no accessory muscle use  GI: Soft, TTP throughout worse in epigastrium. Non distended. normoactive BS. Multiple old scars present  Skin: No rashes or nodules  Psych: A+Ox3    Data:   I have reviewed all of the labs from today. Pertinent labs include:   CBC  Recent Labs     01/25/20  0717 01/25/20  0008 01/24/20  1407 WBC 6.60 8.36 5.77   HGB 8.3* 7.6* 7.6*   HCT 28.6* 26.0* 25.3*   MCV 83.6 82.0 79.8   PLT 368 376 413*     BMP  Recent Labs     01/25/20  0717 01/24/20  1406 01/23/20  0828   NA 140 142 141   K 4.7 4.6 4.6   CL 109* 109* 109*   CO2 22 23 21    BUN 10 7 16    CREAT 1.06 1.05 1.15   CALCIUM 8.4* 8.8 9.4   MG  --   --  1.5   PHOS  --   --  3.8   Glucose: 40->83    LFT  Recent Labs     01/25/20  0717 01/24/20  1406 01/23/20  0828   TOTPRO 6.0* 5.9* 7.2   ALBUMIN 3.6* 3.6* 4.4   BILITOT <0.2 0.2 <0.2   BILICON  --   --  <0.2   ALT 145* 67* 18   AST 203* 82* 26   ALKPHOS 115* 90 112   AMYLASE  --   --  117   LIPASE  --   --  55     Coags  Recent Labs     01/23/20  0828   INR 0.9   PT 12.0       I have reviewed the studies from today. Pertinent study results include:  2/4: Burger esophagram  Esophageal caliber: Normal with no focal narrowing.  Intraluminal filling defects: None.  Esophageal motility: Normal.  Swallowing of barium coated sandwich: Normal with no stasis. There was adherent remnant barium sandwich coating the esophagus, which cleared rapidly following ingestion of water.  Hiatal hernia: Small.  Gastroesophageal reflux: None observed.  IMPRESSION:    Ingestion of barium contrast demonstrated normal motility without stasis. Adherent remnant barium sandwich coated the esophagus, which cleared easily following ingestion of water. Small hiatal hernia present..    2/2: RUQ [place]  1.  As compared to the CT of 08/09/2019, common bile duct distention appears more pronounced, wall gallbladder distention and mild pancreatic ductal enlargement do not appear significantly changed.  2.  A small amount of nonobstructive sludge layers in the gallbladder neck. No visible debris is seen within the common bile duct, however the distal duct is not well-visualized.    2/2 KUB: Normal exam    2/2 CXR: Streaky left basilar opacity is favored to be atelectatic    07/12/19: CTneck w contrast  No neck mass or cervical lymphadenopathy. 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   Partially imaged nonspecific right mastoid effusion.     I have reviewed the electrocardiogram from today. It showed   2/2: Sinus, HR 63, nl axix, short PR, no ischemic changes  Problem-Based Assessment and Plan:   Donna Gross is a 54 y.o. female with history of emphysema and h/o complex peptic ulcer disease s/p multiple surgeries, last in July 2020 (laparoscopic vagotomy and antrectomy, gastrojejunostomy, and hiatal hernia repair) c/b ongoing nausea, vomiting, dysphagia, poor po tolerance for which follows with general surgery  (last seen 12/20) who presents for same found to be hypoglycemic to 40 and COVID positive.     #Intractable N/V, dysphagia, PO intolerance after multiple abdominal surgeries for peptic ulcer disease. Ddx includes structural causes such as result of hiatal hernia repair, dysphagia lusoria (2/2 aberrant retroesophageal right subclavian, noted by GI team), motility disorder as result of vagotomy, autonomic dysfunction after vagotomy (given sensation of swelling and sweating after taking food), ongoing ulcerative disease/gastritis  -Burger esophagram without obvious structural or motility abnormalities  -Plan for EGD tomorrow, NPO at midnight  -Work up increasing LFTs below  -Appreciate Nutrition consult: Add MVI, Ensure Enlive  -Mechanical soft diet, boost supplements  -Reglan 10mg  IV Q8 for motility, monitor Qtc intermittently  -Continue home GI regimen  -Appreciate GI and gen surg consult    #COVID Positive. Risk factors for severe disease include ongoing smoking and h/o emphysema. Discussed repeat COVID test result negative with microbiology. Likely low level colonization detected on first test. Would not consider first test to be a false positive. Discussed this with patient. Will continue to monitor for symptoms.   Symptom onset: NA  Test positive: 2/2  Admit: 2/2 -Pt asymptomatic, not requiring oxygen with no infiltrate on CXR. No COVID specific therapies indicated. Will continue to monitor    #Palpitations with eating  -Place on tele to monitor while eating    #Acute on Chronic Anemia. Iron deficient. B12 low normal  -f/u MMA, homocysteine  -B12 IM x1 2/4  -Ferric Gluconate 125 IV daily x 5 days    #Transaminitis. Suspect Dili. Less likely obstructive but with worsening CBD dilation. Also consider COVID however pt otherwise asymptomatic.  -F/u hep panel  -Will DC Seroquel, methocarbamol for now. Review meds with GI. Obtain MRCP given increased  CBD dilation    #COPD. Advair BID, ipratropium-albuterol MDI prn    #Tobacco use. Nicotine patch.      #Chronic pain on chronic narcotics, POA  #Fibromyalgia  - Tylenol PO  - oxycodone, dilaudid     #Depression/Anxiety. Hold seroquel. Continue fluoxetine for now  #GERD/Gastritis. PPI, Sucralfate    #Insomnia. Trazodone, melatonin prn    Inpatient Checklist  Orders Placed This Encounter      Diet mechanical soft      Diet NPO Except for: Ice Chips and Meds      Dietary nutrition supplements Breakfast, Lunch, Dinner; Boost Glucose Control Chocolate, Boost Breeze [employer], Boost Glucose Control Vanilla, Boost Pudding Chocolate    DVT Prophylaxis: enoxaparin (hold in AM for endoscopy)  GI Prophylaxis: indicated due to patient is on chronic acid suppression therapy as an outpatient: Proton pump inhibitor  Central Lines: none  Tubes/Drains: none    Disposition  ? Expected date of discharge: < 3 days  ? Insurance status: [place] [employer] [place] [employer]  ? Dispo destination: Home  ? DC medication needs: NA  ? DME needs: None    Code Status  Full Code,  Primary Emergency Contact: [name],[name], Home Phone: [phone]    Author  Ozzie Hoyle, MD  01/25/2020 at 4:44 PM

## 2020-01-26 NOTE — Other
Patients Clinical Goal:   Clinical Goal(s) for the Shift: Pain management, comfort and rest.  Identify possible barriers to advancing the care plan: None.  Stability of the patient: Moderately Unstable - medium risk of patient condition declining or worsening    End of Shift Summary:   Pt. Alert and oriented, BMAT 4, room air, able to voice concerns and needs.  Pt VSS, afebrile, no reports of any chest pain or N/V during shift.  Pt reported abdominal pain, pain was managed with IV Dilaudid.  Pt remained safe during shift, all needs were met, all precautions remained in place.

## 2020-01-27 ENCOUNTER — Ambulatory Visit: Payer: PRIVATE HEALTH INSURANCE

## 2020-01-27 LAB — D-Dimer: D-DIMER STAGO: 0.35 ug{FEU}/mL (ref <0.60)

## 2020-01-27 LAB — C-Reactive Protein: C-REACTIVE PROTEIN: 3 mg/dL — ABNORMAL HIGH (ref <0.8)

## 2020-01-27 LAB — Procalcitonin: PROCALCITONIN: 1 ug/L — ABNORMAL HIGH (ref <0.10)

## 2020-01-27 LAB — Ferritin: FERRITIN: 65 ng/mL (ref 8–180)

## 2020-01-27 LAB — Prothrombin Time Panel: INR: 1 s (ref 11.5–14.4)

## 2020-01-27 LAB — Homocysteine, Total: HOMOCYSTEINE, TOTAL: 18 umol/L — ABNORMAL HIGH (ref 5–14)

## 2020-01-27 LAB — HIV-1/2 Ag/Ab 4th Generation with Reflex Confirmation: HIV-1/2 AG/AB 4TH GENERATION WITH REFLEX CONFIRMATION: NONREACTIVE

## 2020-01-27 LAB — COVID-19, Influenza A/B, RSV PCR: INFLUENZA A PCR: NOT DETECTED

## 2020-01-27 MED ADMIN — SUCCINYLCHOLINE CHLORIDE 20 MG/ML IJ SOLN: Stop: 2020-01-27 | NDC 00409662902

## 2020-01-27 MED ADMIN — PLASMA-LYTE A IV SOLN: INTRAVENOUS | @ 02:00:00 | Stop: 2020-01-27

## 2020-01-27 MED ADMIN — PLASMA-LYTE A IV SOLN: INTRAVENOUS | Stop: 2020-01-27 | NDC 00338022104

## 2020-01-27 MED ADMIN — DEXAMETHASONE SODIUM PHOSPHATE 4 MG/ML IJ SOLN: INTRAVENOUS | Stop: 2020-01-27 | NDC 67457042312

## 2020-01-27 MED ADMIN — PROPOFOL 200 MG/20ML IV EMUL: Stop: 2020-01-27 | NDC 63323026929

## 2020-01-27 MED ADMIN — HYDROMORPHONE HCL 1 MG/ML IJ SOLN: .5 mg | INTRAVENOUS | @ 07:00:00 | Stop: 2020-01-27 | NDC 00409128331

## 2020-01-27 MED ADMIN — LIDOCAINE HCL (PF) 1 % IJ SOLN: Stop: 2020-01-27 | NDC 63323049257

## 2020-01-27 MED ADMIN — HYDROMORPHONE HCL 1 MG/ML IJ SOLN: .2 mg | INTRAVENOUS | @ 18:00:00 | Stop: 2020-01-27 | NDC 00409128331

## 2020-01-27 NOTE — Consults
IP CM ACTIVE DISCHARGE PLANNING  Department of Care Coordination    Admit [id number]  Anticipated Date of Discharge: 01/29/2020    Following LN:ZVJK, Barry Dienes., MD    COVID 19   Date of Symptom Onset: N/A  Date of First Positive Test: 01/23/2020  Per IDR: nausea, vomiting, PO intolerance, endoscopy today, LFTs worsening, hypoxic today  DCP: home, DME O2?  EDD: this weekend vs early next week    Disposition     Home, Durable Medical Equipment  [address] [place] [address], [place] [address]     DME or RT Equipment Status (if applicable)     Order pending (1/6)    Non-medical Transportation Arrangement Status (if applicable)     Patient/family secured    Lyn Records,  01/26/2020

## 2020-01-27 NOTE — Other
Patients Clinical Goal:   Clinical Goal(s) for the Shift: Pain control, VSS, nausea/vomiting control, EGD, safety   Identify possible barriers to advancing the care plan: None   Stability of the patient: Moderately Unstable - medium risk of patient condition declining or worsening    End of Shift Summary:   Patient aox4, BMAT 4, 2LNC post procedure. VSS, c/o 9/10 constant abdominal pain. Given PRN Dilaudid and lidocaine patch. Confirms nausea, but no vomiting this shift. No falls. Covid swab sent to lab, EGD complete, awaiting echo and CXR. Patient updated on POC, no BM today. Now on mechanical soft diet, voiding regularly. Tele d/c'd cont. Pulse ox.

## 2020-01-27 NOTE — Progress Notes
Inpatient Gastroenterology Progress Note    Date of service: 01/27/2020  Primary attending: Earlean Polka, MD    Reason for consult: oropharyngeal dysphagia    Interval Events:  - EGD unrevealing  - seroquel, vitamin B12, dicyclomine d/c'd  - AST 288 -> 75, ALT 232 -> 168, AP stable at 146    Subjective:  After EGD, able to finish plate of food but had diffuse abdominal pain and bloating. Has been eating mechanic soft diet, which doesn't cause dysphagia. Also tolerating nutrient supplements.    14 point ROS performed with pertinent positives/negatives listed above.    Medications:  enoxaparin, 40 mg, Subcutaneous, Daily  ferric gluconate, 125 mg, Intravenous, Daily  FLUoxetine, 40 mg, Oral, Daily  HYDROmorphone, 0.2 mg, IV Push, Once  lidocaine, 1 patch, Transdermal, Q24H  metoclopramide, 10 mg, IV Push, TID AC  multivitamin, 1 tablet, Oral, Daily  nicotine, 1 patch, Transdermal, Daily  pantoprazole, 40 mg, Oral, BID  sucralfate, 1 g, Oral, TID w/meals & QHS  traZODone, 100 mg, Oral, QHS      PRN medications: acetaminophen, aluminum-magnesium hydroxide-simethicone, diphenhydrAMINE, fluticasone-salmeterol, HYDROmorphone, ipratropium-albuterol, lidocaine viscous, melatonin, ondansetron ODT **OR** ondansetron injection/IVPB, oxyCODONE Diet: Dietary nutrition supplements Breakfast, Lunch, Dinner; Ensure [employer], Ensure [employer], Ensure [employer] Strawberry  Diet mechanical soft    Allergies: Hydrocodone, Ibuprofen, and Morphine     Objective:  Vitals:  Temp:  [35.6 ?C (96 ?F)-36.3 ?C (97.3 ?F)] 35.6 ?C (96 ?F)   BP: (102-160)/(63-98) 134/97   Heart Rate:  [58-83] 66   Resp:  [14-18] 16   SpO2:  [92 %-99 %] 95 %  I/O last 3 completed shifts:  In: 1547.5 [P.O.:680; I.V.:867.5]  Out: 2650 [Urine:2650]     General: NAD, well-appearing  HEENT: anicteric sclera, MMM, no thrush  Pulm: normal respiratory effort  Abd: soft, diffuse mild TTP, mildly distended, no rebound or guarding Ext: no peripheral edema, no clubbing or cyanosis   Neuro: alert and oriented, no focal neurologic deficits  Skin: wwp, no rashes    Labs:  Recent Labs     01/26/20  0726 01/25/20  0717 01/25/20  0008 01/24/20  1406 01/24/20  1406   WBC 4.52 6.60 8.36   < >  --    HGB 7.4* 8.3* 7.6*   < >  --    HCT 24.7* 28.6* 26.0*   < >  --    MCV 80.2 83.6 82.0   < >  --    PLT 368 368 376   < >  --    NA 140 140  --   --  142   K 4.5 4.7  --   --  4.6   CL 106 109*  --   --  109*   CO2 24 22  --   --  23   BUN 11 10  --   --  7   CREAT 1.06 1.06  --   --  1.05   ALBUMIN 3.6* 3.6*  --   --  3.6*   BILITOT <0.2 <0.2  --   --  0.2   ALKPHOS 146* 115*  --   --  90   AST 288* 203*  --   --  82*   ALT 232* 145*  --   --  67*    < > = values in this interval not displayed.       Radiology:  CT Neck 06/2019  IMPRESSION:  No neck mass or cervical  lymphadenopathy.   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   Partially imaged nonspecific right mastoid effusion.   ?  Esophagram 06/2019  IMPRESSION:   Status post gastrojejunostomy and hiatal hernia repair with normal esophageal motility and transit of contrast into the gastric remnant and jejunum.  ?  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 Nondilated bowel with no wall thickening Normal appendix.   IMPRESSION:  1. ?No evidence of bowel obstruction.  2. ?Stable gallbladder distention with trace pericholecystic fluid. Stable mild central intrahepatic biliary dilatation.  ?  Abdominal US 01/23/20  IMPRESSION:  1. ?As compared to the CT of 08/09/2019, common bile duct distention appears more pronounced, wall gallbladder distention and mild pancreatic ductal enlargement do not appear significantly changed.  2. ?A small amount of nonobstructive sludge layers in the gallbladder neck. No visible debris is seen within the common bile duct, however the distal duct is not well-visualized. Esophagus [name] 01/25/20  IMPRESSION:    Ingestion of barium contrast demonstrated normal motility without stasis. Adherent remnant barium sandwich coated the esophagus, which cleared easily following ingestion of water. Small hiatal hernia present.  ?  Endoscopy:  EGD 01/2019        EGD 01/26/20  Anatomy consistent with Billroth II with patent anastomosis, no ulcers    Impression:  Donna Gross is a 54 y.o. female with h/o refractory and perforated duodenal ulcer s/p transverse colectomy, colocutaneous fistula and diverting ileostomy and more recent lap vagotomy, partial gastrectomy, antrectomy, Billroth II reconstruction, and hiatal hernia repair (06/2019) admitted with N/V and dysphagia  ?  # oropharyngeal dysphagia  # N/V  # aberrant right subclavian artery  # s/p vagotomy  Pt p/w throat/upper esophageal dysphagia to solids with associated N/V only after eating that began after surgery in 06/2019. 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 Also unlikely to be a structural cause given neg EGD and esophageal burger study. DDx also includes esophageal dysmotility or gastroparesis 2/2 vagotomy or further vagal nerve injury. Also consideration is functional dysphagia given symptoms during relatively normal burger esophagram, although this is a diagnosis of exclusion. Overall, patient appears to be tolerating mechanical soft diet with improved symptoms of dysphagia. Rest of w/u can be deferred as an outpatient as well as treatment options, such as TCA, given elevated LFTs most likely 2/2 DILI (dicyclomine > seroquel).    Recommendations:  ? Daily LFTs  ? If discharges, needs repeat LFTs in 1-2 wks with PCP  ? Ensure Enlive TIDWM  ? Decrease to PPI daily ? Trial of reglan 10mg  TID before meals - if no improvement in symptoms, ok to d/c  ? Outpatient GI f/u with Dr. Elita Boone in 2-4 wks for consideration of manometry  ? We will sign off. Please call if questions.    Recommendations discussed with primary team. Patient seen and discussed with my attending Dr. Elita Boone who agrees with above impressions and recommendations.     Janene Harvey. Agnes Lawrence, MD  GI Fellow, [phone] (if after 5pm or weekend, page on-call GI fellow)  01/27/2020 9:21 AM

## 2020-01-27 NOTE — Other
Patients Clinical Goal:   Clinical Goal(s) for the Shift: Monitor vss, comfort, safety  Identify possible barriers to advancing the care plan: None  Stability of the patient: Moderately Stable - low risk of patient condition declining or worsening   End of Shift Summary:   Pt is a 54 yo female w/hx of emphysema, smoking, PUD s/p multiple surgeries, ongoing N/V, dysphagia, poor PO intake, p/w N/V, PO intolerance and found to be COVID positive    Pt is A/Ox4   Pt is on 2L of O2 via NC, satting in the low-to-mid 90's    Plan of Care Response and Outcomes (explanation of POC effectiveness or alterations made):   Continue with pain and nausea management    Possible remdesivir and dexamethasone if pt becomes hypoxic    GI consult ongoing   Echo pending today      Change in Status or Condition (concerning observations, events or assessments, and interventions):   N/A    Interdisciplinary Communication (team member communication):    N/A    Psychosocial Communication (family of patient issues potentially affecting care):   N/A    Problem/Goal Progression (in relation to specific goals of care or hospitalization):   EGD done 2/5   Pt tolerated meal post EGD, though reported pain, states this occurs after every meal, addressed with Oxycodone    Pt reported that the current pain regimen is not adequate for her pain, requested increase in frequency of medication, notified Dr. Serafina Mitchell, spot dose provided     BOOST 8 P's Complete Or Pending BOOST #s  Refer to D.A.N. for specific details and progress.

## 2020-01-27 NOTE — Progress Notes
Patient in [facility] for EGD. This case is treated as Positive Covid 19. She was Covid (+) on 01/23/20 and Covid (-) on 01/24/20. Alert and oriented x 4. Able to signed consent for EGD and Anesthesia. Patient is intubated will proceed with scheduled EGD.

## 2020-01-27 NOTE — Progress Notes
EGD completed, no biopsy taken. Normal EGD. Verbal report given to floor nurse Jetta Lout)

## 2020-01-27 NOTE — Procedures
PATIENT NAME:       Donna Gross, Donna Gross  DATE OF BIRTH:       01/20/1966  RECORD NUMBER:      [id number]  DATE/TIME OF PROCEDURE:     01/26/2020 / 03:14:00 PM  ENDOSCOPIST:       Riccardo Dubin, MD  REFERRING PHYSICIAN:        FELLOW:           INDICATIONS FOR EXAMINATION:      Dysphagia               PROCEDURE PERFORMED:             UPPER GI ENDOSCOPY - standard EGD    MEDICATIONS:    General anesthesia    PROCEDURE TECHNIQUE:  Patient's medications, allergies, past medical, surgical, social and family histories were reviewed and updated as appropriate. A discussion of informed consent was had with the patient and/or the patient's family prior to the procedure, including   sedation.  The alternatives, benefits and risks of  the procedure including but not limited to perforation, hemorrhage, infection, adverse drug reaction and aspiration were discussed    Procedure Details:     Informed consent was obtained for the procedure, including sedation.  Risks of perforation, hemorrhage, infection, adverse drug reaction and aspiration were discussed. The patient was placed in position.  Based on the pre-procedure assessment, including   review of the patient's medical history, medications, allergies, and review of systems, the patient had been deemed to be an appropriate candidate for conscious sedation; the patient was therefore sedated with the medications listed.  The patient was   monitored continuously with pulse oximetry, blood pressure monitoring, and direct observations.      The #[id number] (E-6) was introduced and passed without difficulty to third part of duodenum. A careful inspection was made as the endoscope was withdrawn.    Findings and interventions are described below:    TOTAL WITHDRAWL TIME:    TOTAL INSERTION TIME:      EXTENT OF EXAM:  third part of duodenum            INSTRUMENTS:   #[id number] (E-6)  TECHNICAL DIFFICULTY:  No   LIMITATIONS:      None  TOLERANCE:   Good  VISUALIZATION:  Good    FINDINGS: Esophagus: normal mucosa, no mass, no ulcer, no esophagitis, no stricture.   Stomach: anatomy consistent with Billroth II, patent anastomosis  Duodenum: normal    ESTIMATED BLOOD LOSS:   None     DIAGNOSIS:  Esophagus: normal mucosa, no mass, no ulcer, no esophagitis, no stricture.   Stomach: anatomy consistent with Billroth II, patent anastomosis  Duodenum: normal    RECOMMENDATIONS:  Return to floor          This electronic signature authenticates all electronic and/or handwritten documentation, including orders, generated by the signer during the episode of care contained in this record.  01/27/2020 11:48:47 AM By Riccardo Dubin

## 2020-01-27 NOTE — Other
Patients Clinical Goal:   Clinical Goal(s) for the Shift: safety  Identify possible barriers to advancing the care plan: none  Stability of the patient: Moderately Stable - low risk of patient condition declining or worsening   End of Shift Summary:   AAO x4, in no apparent distress.  Reported 9-10/10 abdominal pain. Medicated with IV Dilaudid/ po Oxycodone.  Afebrile, vitals stable. Denies any shortness of breath. O2 sat 95-97% on 1lnc.  H/H 7.4/24.7, Ferric Gluconate administered.  On mechanical soft diet. Denies any swallowing problems.  Ambulated ad lib with steady gait.  Vitals:    01/27/20 0536 01/27/20 0841 01/27/20 1250 01/27/20 1630   BP: 109/63 134/97 109/76 102/65   Pulse: 59 66 68 73   Resp: 16 16 16 16    Temp: 36 C (96.8 F) (!) 35.6 C (96 F) 36.1 C (96.9 F) 36.2 C (97.2 F)   TempSrc: Axillary  Axillary Axillary   SpO2: 96% 95% 97% 97%   Weight:

## 2020-01-27 NOTE — Progress Notes
Medicine Progress Note     PMD: [facility], MD  DATE OF SERVICE: 01/27/2020  HOSPITAL DAY: 3  CHIEF COMPLAINT: Abdominal Pain (N/V, and CP, stating she had ulcer surgery in july here, and ever since then she is not well, unbale to tolerate POs, throat swells after eating annd hard to breathe )    Last 24 Hour/Overnight Events:   Underwent EGD on 2/05 which revealed normal esophagus, stomach anatomy consistent with Billroth II, patent anastomosis, and normal esophagus.    Subjective/Review of Systems:   Patient reports persistent, chronic abdominal pain, as well as nausea but no vomiting. No fever, chills, shortness of breath. No new constitutional, cardiac, respiratory, or GI complaints.    Medications:   Scheduled:  enoxaparin, 40 mg, Subcutaneous, Daily  ferric gluconate, 125 mg, Intravenous, Daily  FLUoxetine, 40 mg, Oral, Daily  lidocaine, 1 patch, Transdermal, Q24H  metoclopramide, 10 mg, IV Push, TID AC  multivitamin, 1 tablet, Oral, Daily  nicotine, 1 patch, Transdermal, Daily  pantoprazole, 40 mg, Oral, BID  sucralfate, 1 g, Oral, TID w/meals & QHS  traZODone, 100 mg, Oral, QHS  PRN:  acetaminophen, aluminum-magnesium hydroxide-simethicone, diphenhydrAMINE, fluticasone-salmeterol, HYDROmorphone, ipratropium-albuterol, lidocaine viscous, melatonin, ondansetron ODT **OR** ondansetron injection/IVPB, oxyCODONE  Infusions:      Physical Exam:   Temp:  [35.6 ?C (96 ?F)-36.3 ?C (97.3 ?F)] 36.1 ?C (96.9 ?F)  Heart Rate:  [58-83] 68  Resp:  [14-18] 16  BP: (102-160)/(63-98) 109/76  NBP Mean:  [76-109] 87  SpO2:  [95 %-99 %] 97 %  I/O: I/O last 2 completed shifts:  In: 300 [I.V.:300]  Out: 1850 [Urine:1850]  Const: NAD  Eyes: Sclerae anicteric  ENMT: No oral lesions, MMM  CV: RRR, no murmurs, no edema  Resp: CTAB, no accessory muscle use  GI: Soft, TTP throughout worse in epigastrium. Non distended. normoactive BS. Multiple old scars present  Skin: No rashes or nodules  Psych: A+Ox3 Data:   I have reviewed all of the labs from today. Pertinent labs include:   CBC  Recent Labs     01/26/20  0726 01/25/20  0717 01/25/20  0008   WBC 4.52 6.60 8.36   HGB 7.4* 8.3* 7.6*   HCT 24.7* 28.6* 26.0*   MCV 80.2 83.6 82.0   PLT 368 368 376     BMP  Recent Labs     01/26/20  0726 01/25/20  0717   NA 140 140   K 4.5 4.7   CL 106 109*   CO2 24 22   BUN 11 10   CREAT 1.06 1.06   CALCIUM 8.7 8.4*   Glucose: 80    LFT  Recent Labs     01/26/20  0726 01/25/20  0717   TOTPRO 5.9* 6.0*   ALBUMIN 3.6* 3.6*   BILITOT <0.2 <0.2   ALT 232* 145*   AST 288* 203*   ALKPHOS 146* 115*     Coags  No results for input(s): INR, PT, APTT in the last 72 hours.    I have reviewed the studies from today. Pertinent study results include:   CXR pending    2/5 MRCP  Mild biliary and pancreatic ductal dilatation, similar in appearance dating back to 2016, without a detectable cause of obstruction.    2/4: Burger esophagram  Esophageal caliber: Normal with no focal narrowing.  Intraluminal filling defects: None.  Esophageal motility: Normal.  Swallowing of barium coated sandwich: Normal with no stasis. There was adherent  remnant barium sandwich coating the esophagus, which cleared rapidly following ingestion of water.  Hiatal hernia: Small.  Gastroesophageal reflux: None observed.  IMPRESSION:    Ingestion of barium contrast demonstrated normal motility without stasis. Adherent remnant barium sandwich coated the esophagus, which cleared easily following ingestion of water. Small hiatal hernia present..    2/2: RUQ [place]  1.  As compared to the CT of 08/09/2019, common bile duct distention appears more pronounced, wall gallbladder distention and mild pancreatic ductal enlargement do not appear significantly changed.  2.  A small amount of nonobstructive sludge layers in the gallbladder neck. No visible debris is seen within the common bile duct, however the distal duct is not well-visualized. 2/2 CXR: Streaky left basilar opacity is favored to be atelectatic    07/12/19: CTneck w contrast  No neck mass or cervical lymphadenopathy.   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   Partially imaged nonspecific right mastoid effusion.     I have reviewed the electrocardiogram from today. It showed   2/2: Sinus, HR 63, nl axix, short PR, no ischemic changes  Problem-Based Assessment and Plan:   Donna Gross is a 54 y.o. female with history of emphysema, active smoker and h/o complex peptic ulcer disease s/p multiple surgeries, last in July 2020 (laparoscopic vagotomy and antrectomy, gastrojejunostomy, and hiatal hernia repair) c/b ongoing nausea, vomiting, dysphagia, poor po tolerance presents for N/V PO intolerance and found to be COVID positive.     #Intractable N/V, dysphagia, PO intolerance after multiple abdominal surgeries for peptic ulcer disease. Ddx includes structural causes such as result of hiatal hernia repair, dysphagia lusoria (2/2 aberrant retroesophageal right subclavian, noted by GI team), motility disorder as result of vagotomy, autonomic dysfunction after vagotomy (given sensation of swelling and sweating after taking food), ongoing ulcerative disease/gastritis  -Burger esophagram without obvious structural or motility abnormalities  -EGD 2/05 revealed normal esophagus, stomach anatomy consistent with Billroth II, patent anastomosis, and normal esophagus  -Work up increasing LFTs below  -Appreciate Nutrition consult: MVI, Ensure Enlive  -Mechanical soft diet- to remain on this diet on discharge per GI recs  -Reglan 10mg  IV Q8 for motility, monitor Qtc intermittently  -Continue home GI regimen  -Appreciate GI and gen surg consult #COVID Positive. Risk factors for severe disease include ongoing smoking and h/o emphysema. Discussed repeat COVID test result negative with microbiology. Likely low level colonization detected on first test. Third test negative now as well.  Symptom onset: Query 2/5 if hypoxia COVID related  Test positive: 2/2  Admit: 2/2  -2nd repeat COVID-19 pcr negative. Afebrile, SpO2 97% on 1L- can likely be weaned to room air today. Hold COVID-directed therapies.    #Transaminitis. Per GI, suspect Dili. MRCP negative for structural etiology. Also consider COVID however pt otherwise asymptomatic.  -Hep panel negative  -Will DC Seroquel, methocarbamol for now. Can consider stopping fluoxetine temporarily (pt started this over the last two months)  -Repeat LFTs daily     #Mild hypoxia. Query 2/2 COVID, though 2nd and 3rd covid-19 pcr's negative. Will also evaluate for other causes such as volume overload, aspiration  -BNP elevated, TTE pending.  -CXR with atelectasis but no consolidations, encourage IS  -Will consider trial of gentle diuresis pending TTE results    #Palpitations with eating  -Place on tele to monitor while eating    #Acute on Chronic Anemia. Iron deficient. B12 low normal, Folate wnl. Hypoproliferative  -f/u  MMA, homocysteine  -B12 IM x1 2/4  -Ferric Gluconate 125 IV daily x 5 days (2/4-)    #COPD. Advair BID, ipratropium-albuterol MDI prn    #Tobacco use. Nicotine patch.      #Chronic pain on chronic narcotics, POA  #Fibromyalgia  - Tylenol PO  - oxycodone, dilaudid     #Depression/Anxiety. Hold seroquel. Continue fluoxetine for now  #GERD/Gastritis. PPI, Sucralfate    #Insomnia. Trazodone, melatonin prn    Inpatient Checklist  Orders Placed This Encounter      Diet mechanical soft      Dietary nutrition supplements Breakfast, Lunch, Dinner; Ensure [employer], Ensure [employer], Ensure [employer] Strawberry    DVT Prophylaxis: enoxaparin GI Prophylaxis: indicated due to patient is on chronic acid suppression therapy as an outpatient: Proton pump inhibitor  Central Lines: none  Tubes/Drains: none    Disposition  ? Expected date of discharge: < 3 days  ? Insurance status: [place] [employer]  ? Dispo destination: Home  ? DC medication needs: NA  ? DME needs: None    Code Status  Full Code,  Primary Emergency Contact: [name],[name], Home Phone: [phone]     Total time spent 40 minutes, including > 50% of this time evaluating patient and discussing plan of care with patient at bedside, discussing/coordinating care with nurse and case manager and consultants.  ?  Author  Earlean Polka, MD  01/27/2020 at 3:21 PM

## 2020-01-27 NOTE — Progress Notes
Patient: Donna Gross  MRN: [id number]  Date of Service: 01/27/2020  Hospital Day: 3  PCP: [facility]    ID: Donna Gross is a 54 y.o. female with a history of peptic ulcer?disease s/p multiple surgeries as follows:  1. Ex lap, diverting ileostomy at [facility] 07/2014 for duodenal/colonic perforation  2. Ex lap, colocutaneous fistula takedown, ileostomy takedown, abdominal wall reconstruction 12/13/2015 for prolapse  3. Laparoscopic vagotomy and antrectomy, gastrojejunostomy, and hiatal hernia repair 06/21/19 for medically refractor peptic ulcer disease    She recovered from this operation. She, over the past few months, thought, has dealt with her issues of chronic pain. She, however, has had also some dysphagia. She reports that she is losing weight and although food goes down and she has bowel movements and she does not have any significant digestive issues or pain, she feels like food hangs up in her upper esophagus in the upper esophageal sphincter pharyngeal region.    Admitted 01/23/2020 for persistent dysphagia, acute on chronic abdominal pain.    Interval events:  [place] w distended gallbladder, +sludge  MRCP dilated CBD and pancreatic duct, persistent since 2016  Burger study normal  EGD unremarkable    Medications:  ? enoxaparin  40 mg Subcutaneous Daily   ? ferric gluconate  125 mg Intravenous Daily   ? FLUoxetine  40 mg Oral Daily   ? lidocaine  1 patch Transdermal Q24H   ? metoclopramide  10 mg IV Push TID AC   ? multivitamin  1 tablet Oral Daily   ? nicotine  1 patch Transdermal Daily   ? pantoprazole  40 mg Oral BID   ? sucralfate  1 g Oral TID w/meals & QHS   ? traZODone  100 mg Oral QHS     acetaminophen, aluminum-magnesium hydroxide-simethicone, diphenhydrAMINE, fluticasone-salmeterol, HYDROmorphone, ipratropium-albuterol, lidocaine viscous, melatonin, ondansetron ODT **OR** ondansetron injection/IVPB, oxyCODONE    Vitals: Temp:  [36 ?C (96.8 ?F)-36.6 ?C (97.8 ?F)] 36 ?C (96.8 ?F)  Heart Rate:  [58-86] 59  Resp:  [14-18] 16  BP: (102-160)/(63-98) 109/63  NBP Mean:  [76-103] 76  SpO2:  [90 %-99 %] 96 %  PEEP (setting):  [5 cmH2O] 5 cmH2O    I/Os:    Intake/Output Summary (Last 24 hours) at 01/27/2020 0621  Last data filed at 01/26/2020 1730  Gross per 24 hour   Intake 867.5 ml   Output 2650 ml   Net -1782.5 ml       Labs:  BMP  Recent Labs     01/26/20  0726 01/25/20  0717 01/24/20  1406   NA 140 140 142   K 4.5 4.7 4.6   CL 106 109* 109*   CO2 24 22 23    BUN 11 10 7    CREAT 1.06 1.06 1.05   GLUCOSE 80 88 83   CALCIUM 8.7 8.4* 8.8     LFTs  Recent Labs     01/26/20  0726 01/25/20  0717 01/24/20  1406   TOTPRO 5.9* 6.0* 5.9*   ALBUMIN 3.6* 3.6* 3.6*   BILITOT <0.2 <0.2 0.2   ALT 232* 145* 67*   AST 288* 203* 82*   ALKPHOS 146* 115* 90     CBC  Recent Labs     01/26/20  0726 01/25/20  0717 01/25/20  0008   WBC 4.52 6.60 8.36   HGB 7.4* 8.3* 7.6*   HCT 24.7* 28.6* 26.0*   MCV 80.2 83.6 82.0   PLT  368 368 376     Coags  No results for input(s): INR, PT, APTT in the last 72 hours.    Imaging:  Reviewed    Micro:  Reviewed    Assessment/Plan:  Donna Gross is a 54 y.o. female with a history of peptic ulcer?disease s/p multiple surgeries as follows:  1. Ex lap, diverting ileostomy at [facility] 07/2014 for duodenal/colonic perforation  2. Ex lap, colocutaneous fistula takedown, ileostomy takedown, abdominal wall reconstruction 12/13/2015 for prolapse  3. Laparoscopic vagotomy and antrectomy, gastrojejunostomy, and hiatal hernia repair 06/21/19 for medically refractor peptic ulcer disease    Reviewed imaging.  No clear source of symptoms on extensive workup.  Consider nutrition consult to counsel patient about post-gastrectomy diet and dumping syndrome.  Follow up with Dr. [name] as an outpatient.  General surgery will sign off.    Author:    Madie Reno, MD  PGY-5 ~ General Surgery  [phone]  01/27/2020 6:21 AM The patient was examined and discussed with Surgical Attending who agrees with assessment and plans listed above.

## 2020-01-28 LAB — Differential Automated: MONOCYTE PERCENT, AUTO: 8 (ref 0.00–0.04)

## 2020-01-28 LAB — CBC: RED BLOOD CELL COUNT: 3.26 x10E6/uL — ABNORMAL LOW (ref 3.96–5.09)

## 2020-01-28 LAB — Comprehensive Metabolic Panel
BILIRUBIN,TOTAL: <0.2 mg/dL (ref 0.1–1.2)
TOTAL PROTEIN: 6.1 g/dL (ref 6.1–8.2)

## 2020-01-28 MED ORDER — PROMETHAZINE HCL 6.25 MG/5ML PO SOLN
6.25 mg | Freq: Four times a day (QID) | ORAL | PRN
Start: 2020-01-28 — End: ?

## 2020-01-28 MED ORDER — OXYCODONE HCL 5 MG PO TABS
5 mg | ORAL_TABLET | Freq: Four times a day (QID) | ORAL | 0 refills | PRN
Start: 2020-01-28 — End: ?
  Filled 2020-01-30: qty 20, 5d supply, fill #0

## 2020-01-28 MED ORDER — NICOTINE 14 MG/24HR TD PT24
1 | MEDICATED_PATCH | Freq: Every day | TRANSDERMAL | 0 refills
Start: 2020-01-28 — End: ?

## 2020-01-28 MED ORDER — QUETIAPINE FUMARATE 100 MG PO TABS
100 mg | Freq: Two times a day (BID) | ORAL
Start: 2020-01-28 — End: ?

## 2020-01-28 MED ORDER — MULTI-VITAMINS PO TABS
1 | ORAL_TABLET | Freq: Every day | ORAL | 0 refills | 30.00000 days
Start: 2020-01-28 — End: ?

## 2020-01-28 MED ORDER — LANSOPRAZOLE 30 MG PO CPDR
30 mg | ORAL_CAPSULE | Freq: Every day | ORAL | 0 refills | 45.00000 days
Start: 2020-01-28 — End: ?

## 2020-01-28 MED ORDER — ONDANSETRON 4 MG PO TBDP
4 mg | ORAL_TABLET | Freq: Four times a day (QID) | ORAL | 0 refills | 8.00000 days | PRN
Start: 2020-01-28 — End: ?

## 2020-01-28 MED ADMIN — OXYCODONE HCL 5 MG PO TABS: 10 mg | ORAL | @ 22:00:00 | Stop: 2020-02-04 | NDC 00406055262

## 2020-01-28 MED ADMIN — HYDROMORPHONE HCL 1 MG/ML IJ SOLN: .5 mg | INTRAVENOUS | @ 04:00:00 | Stop: 2020-01-28 | NDC 00409128331

## 2020-01-28 MED ADMIN — OXYCODONE HCL 5 MG PO TABS: 10 mg | ORAL | @ 17:00:00 | Stop: 2020-02-04 | NDC 00406055262

## 2020-01-28 MED ADMIN — HYDROMORPHONE HCL 1 MG/ML IJ SOLN: .5 mg | INTRAVENOUS | @ 20:00:00 | Stop: 2020-02-04 | NDC 00409128331

## 2020-01-28 NOTE — Other
Patients Clinical Goal:   Clinical Goal(s) for the Shift: monitor vss, comfort, safety  Identify possible barriers to advancing the care plan: None  Stability of the patient: Moderately Stable - low risk of patient condition declining or worsening   End of Shift Summary:   Pt is a 54 yo female w/hx of emphysema, smoking, PUD s/p multiple surgeries, ongoing N/V, dysphagia, poor PO intake, p/w N/V, PO intolerance and found to be COVID positive    Pt is A/Ox4   Pt is on 2L of O2 via NC, satting in the low-to-mid 90's    Plan of Care Response and Outcomes (explanation of POC effectiveness or alterations made):   Continue with pain and nausea management    GI consult ongoing, to be DCd on mechanical soft diet     Change in Status or Condition (concerning observations, events or assessments, and interventions):   N/A    Interdisciplinary Communication (team member communication):    N/A    Psychosocial Communication (family of patient issues potentially affecting care):   N/A    Problem/Goal Progression (in relation to specific goals of care or hospitalization):   Pt reported that the current pain regimen is not adequate for her pain, requested increase in frequency of medication, notified Dr. Wesley Blas, spot dose provided     BOOST 8 P's Complete Or Pending BOOST #s  Refer to D.A.N. for specific details and progress.

## 2020-01-28 NOTE — Progress Notes
Medicine Progress Note     PMD: [facility]  DATE OF SERVICE: 01/28/2020  HOSPITAL DAY: 4  CHIEF COMPLAINT: Abdominal Pain (N/V, and CP, stating she had ulcer surgery in july here, and ever since then she is not well, unbale to tolerate POs, throat swells after eating annd hard to breathe )    Last 24 Hour/Overnight Events:   - LFTs downtrending.  - Weaned off of supplemental O2 at rest, SpO2 92-95% on room air. Reports dyspnea when ambulating from bed to bathroom.    Subjective/Review of Systems:   Patient reports persistent, chronic abdominal pain, as well as nausea but no vomiting. No fever, chills. No new constitutional, cardiac, respiratory, or GI complaints.    Medications:   Scheduled:  enoxaparin, 40 mg, Subcutaneous, Daily  ferric gluconate, 125 mg, Intravenous, Daily  FLUoxetine, 40 mg, Oral, Daily  lidocaine, 1 patch, Transdermal, Q24H  metoclopramide, 10 mg, IV Push, TID AC  multivitamin, 1 tablet, Oral, Daily  nicotine, 1 patch, Transdermal, Daily  pantoprazole, 40 mg, Oral, BID  sucralfate, 1 g, Oral, TID w/meals & QHS  traZODone, 100 mg, Oral, QHS  PRN:  acetaminophen, aluminum-magnesium hydroxide-simethicone, fluticasone-salmeterol, HYDROmorphone, ipratropium-albuterol, lidocaine viscous, melatonin, ondansetron ODT **OR** ondansetron injection/IVPB, oxyCODONE, oxyCODONE  Infusions:      Physical Exam:   Temp:  [36.1 ?C (96.9 ?F)-36.6 ?C (97.8 ?F)] 36.4 ?C (97.6 ?F)  Heart Rate:  [55-77] 70  Resp:  [16-18] 16  BP: (97-109)/(60-89) 97/60  NBP Mean:  [72-96] 72  SpO2:  [92 %-97 %] 96 %  I/O: I/O last 2 completed shifts:  In: 680 [P.O.:680]  Out: 1200 [Urine:1200]  Const: NAD  Eyes: Sclerae anicteric  ENMT: No oral lesions, MMM  CV: RRR, no murmurs, no edema  Resp: normal work of breathing, no accessory muscle use  GI: Soft, TTP throughout worse in epigastrium. Non distended. normoactive BS. Multiple old scars present  Skin: No rashes or nodules  Psych: A+Ox3    Data: I have reviewed all of the labs from today. Pertinent labs include:   CBC  Recent Labs     01/28/20  0615 01/26/20  0726   WBC 9.10 4.52   HGB 7.9* 7.4*   HCT 26.7* 24.7*   MCV 81.9 80.2   PLT 357 368     BMP  Recent Labs     01/28/20  0615 01/27/20  1458 01/26/20  0726   NA 141 138 140   K 4.3 4.4 4.5   CL 105 101 106   CO2 24 26 24    BUN 10 13 11    CREAT 0.99 1.15 1.06   CALCIUM 9.2 9.6 8.7   Glucose: 80    LFT  Recent Labs     01/28/20  0615 01/27/20  1458 01/26/20  0726   TOTPRO 6.1 6.9 5.9*   ALBUMIN 3.6* 4.3 3.6*   BILITOT <0.2 <0.2 <0.2   ALT 109* 168* 232*   AST 41 75* 288*   ALKPHOS 116* 146* 146*     Coags  Recent Labs     01/27/20  1458   INR 1.0   PT 12.9       I have reviewed the studies from today. Pertinent study results include:     2/05 ERCP:  FINDINGS:   Esophagus: normal mucosa, no mass, no ulcer, no esophagitis, no stricture.   Stomach: anatomy consistent with Billroth II, patent anastomosis  Duodenum: normal    2/5 MRCP  Mild biliary and pancreatic ductal dilatation, similar in appearance dating back to 2016, without a detectable cause of obstruction.    2/4: Burger esophagram  Esophageal caliber: Normal with no focal narrowing.  Intraluminal filling defects: None.  Esophageal motility: Normal.  Swallowing of barium coated sandwich: Normal with no stasis. There was adherent remnant barium sandwich coating the esophagus, which cleared rapidly following ingestion of water.  Hiatal hernia: Small.  Gastroesophageal reflux: None observed.  IMPRESSION:    Ingestion of barium contrast demonstrated normal motility without stasis. Adherent remnant barium sandwich coated the esophagus, which cleared easily following ingestion of water. Small hiatal hernia present..    2/2: RUQ [place]  1.  As compared to the CT of 08/09/2019, common bile duct distention appears more pronounced, wall gallbladder distention and mild pancreatic ductal enlargement do not appear significantly changed. 2.  A small amount of nonobstructive sludge layers in the gallbladder neck. No visible debris is seen within the common bile duct, however the distal duct is not well-visualized.    2/2 CXR: Streaky left basilar opacity is favored to be atelectatic    07/12/19: CTneck w contrast  No neck mass or cervical lymphadenopathy.   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   Partially imaged nonspecific right mastoid effusion.     I have reviewed the electrocardiogram from today. It showed   2/2: Sinus, HR 63, nl axix, short PR, no ischemic changes    Problem-Based Assessment and Plan:   Donna Gross is a 54 y.o. female with history of emphysema, active smoker and h/o complex peptic ulcer disease s/p multiple surgeries, last in July 2020 (laparoscopic vagotomy and antrectomy, gastrojejunostomy, and hiatal hernia repair) c/b ongoing nausea, vomiting, dysphagia, poor po tolerance presents for N/V PO intolerance and found to be COVID positive.     #Intractable N/V, dysphagia, PO intolerance after multiple abdominal surgeries for peptic ulcer disease. Ddx includes structural causes such as result of hiatal hernia repair, dysphagia lusoria (2/2 aberrant retroesophageal right subclavian, noted by GI team), motility disorder as result of vagotomy, autonomic dysfunction after vagotomy (given sensation of swelling and sweating after taking food), ongoing ulcerative disease/gastritis  -Burger esophagram without obvious structural or motility abnormalities  -EGD 2/05 revealed normal esophagus, stomach anatomy consistent with Billroth II, patent anastomosis, and normal esophagus  -Work up of transaminitis below, now downtrending  -Appreciate Nutrition consult: MVI, Ensure Enlive  -Mechanical soft diet- to remain on this diet on discharge per GI recs  -Reglan 10mg  IV Q8 for motility, monitor Qtc intermittently  -Continue home GI regimen -Appreciate GI and gen surg consult    #COVID Positive. Risk factors for severe disease include ongoing smoking and h/o emphysema. Discussed repeat COVID test result negative with microbiology. Likely low level colonization detected on first test. Third test negative now as well.  Symptom onset: Query 2/5 if hypoxia COVID related  Test positive: 2/2  Admit: 2/2  -2nd and 3rd repeat COVID-19 pcr negative. Afebrile, no hypoxia at this time. Hold COVID-directed therapies.    #Transaminitis. Per GI, suspect Dili. MRCP negative for structural etiology. Also consider COVID however pt otherwise asymptomatic. Dowtnrending.  -Hep panel negative  -Will DC Seroquel, methocarbamol for now.   -Repeat LFTs daily   -LFTs 1-2 weeks after discharge    #Mild hypoxia. Query 2/2 COVID, though 2nd and 3rd covid-19 pcr's negative. TTE shows normal EF and diastolic function, IVC without evidence of overload.   -  Encourage IS, ambualtion    #Palpitations with eating  -Place on tele to monitor while eating    #Acute on Chronic Anemia. Iron deficient. B12 low normal, Folate wnl. Hypoproliferative  -f/u MMA, homocysteine high  -B12 IM x1 2/4  -Ferric Gluconate 125 IV daily x 5 days (2/4-)    #COPD. Advair BID, ipratropium-albuterol MDI prn    #Tobacco use. Nicotine patch.      #Chronic pain on chronic narcotics, POA  #Fibromyalgia  - Tylenol PO  - oxycodone, dilaudid     #Depression/Anxiety. Hold seroquel. Continue fluoxetine for now  #GERD/Gastritis. PPI, Sucralfate    #Insomnia. Trazodone, melatonin prn    Inpatient Checklist  Orders Placed This Encounter      Diet mechanical soft      Dietary nutrition supplements Breakfast, Lunch, Dinner; Ensure [employer], Ensure [employer], Ensure [employer] Strawberry    DVT Prophylaxis: enoxaparin    GI Prophylaxis: indicated due to patient is on chronic acid suppression therapy as an outpatient: Proton pump inhibitor  Central Lines: none  Tubes/Drains: none    Disposition ? Expected date of discharge: < 3 days  ? [place] [employer] [place] [employer]  ? Dispo destination: Home  ? DC medication needs: NA  ? DME needs: None    Code Status  Full Code,  Primary Emergency Contact: [name],[name], Home Phone: [phone]     Total time spent 40 minutes, including > 50% of this time evaluating patient and discussing plan of care with patient at bedside, discussing/coordinating care with nurse and case manager and consultants.  ?  Author  Earlean Polka, MD  01/28/2020 at 2:26 PM

## 2020-01-29 ENCOUNTER — Telehealth: Payer: PRIVATE HEALTH INSURANCE

## 2020-01-29 LAB — Comprehensive Metabolic Panel
ALBUMIN: 4 g/dL (ref 3.9–5.0)
SODIUM: 137 mmol/L (ref 135–146)

## 2020-01-29 LAB — Differential Automated: NEUTROPHIL PERCENT, AUTO: 59.3 (ref 0.20–0.80)

## 2020-01-29 LAB — Cocci IgG/IgM EIA: COCCIDIOIDES IGM EIA: <0.15 (ref <0.150)

## 2020-01-29 LAB — CBC: WHITE BLOOD CELL COUNT: 9.84 10*3/uL (ref 4.16–9.95)

## 2020-01-29 MED ORDER — METOCLOPRAMIDE HCL 10 MG PO TABS
10 mg | ORAL_TABLET | Freq: Three times a day (TID) | ORAL | 0 refills | 30.00000 days | Status: AC
Start: 2020-01-29 — End: ?
  Filled 2020-01-30: qty 90, 30d supply, fill #0

## 2020-01-29 MED ORDER — OXYCODONE HCL 5 MG PO TABS
5 mg | ORAL_TABLET | Freq: Four times a day (QID) | ORAL | 0 refills | 5.00000 days | Status: AC | PRN
Start: 2020-01-29 — End: ?

## 2020-01-29 MED ORDER — MULTI-VITAMINS PO TABS
1 | ORAL_TABLET | Freq: Every day | ORAL | 0 refills | 30.00000 days | Status: AC
Start: 2020-01-29 — End: ?
  Filled 2020-01-30: qty 30, 30d supply, fill #0

## 2020-01-29 MED ORDER — ONDANSETRON 4 MG PO TBDP
4 mg | ORAL_TABLET | Freq: Four times a day (QID) | ORAL | 0 refills | 5.00000 days | Status: AC | PRN
Start: 2020-01-29 — End: ?
  Filled 2020-01-30: qty 20, 5d supply, fill #0

## 2020-01-29 MED ADMIN — OXYCODONE HCL 5 MG PO TABS: 10 mg | ORAL | @ 07:00:00 | Stop: 2020-02-04 | NDC 00406055262

## 2020-01-29 MED ADMIN — FLUTICASONE-SALMETEROL 250-50 MCG/DOSE IN AEPB: 1 | RESPIRATORY_TRACT | @ 06:00:00 | Stop: 2020-02-03 | NDC 00378932132

## 2020-01-29 MED ADMIN — HYDROMORPHONE HCL 1 MG/ML IJ SOLN: .5 mg | INTRAVENOUS | @ 02:00:00 | Stop: 2020-02-04 | NDC 00409128331

## 2020-01-29 MED ADMIN — HYDROMORPHONE HCL 1 MG/ML IJ SOLN: .2 mg | INTRAVENOUS | @ 21:00:00 | Stop: 2020-01-29 | NDC 00409128331

## 2020-01-29 NOTE — Other
Patients Clinical Goal:   Clinical Goal(s) for the Shift: VSS, Pain N/V management   Identify possible barriers to advancing the care plan: none   Stability of the patient: Moderately Unstable - medium risk of patient condition declining or worsening    End of Shift Summary:     Problems: Dysphagia, Intractable nausea and vomiting.      A/Ox4, VSS, Afebrile    1L NC Desat 85-88% on room air while sleeping, mid 90's when awake     Chronic abdomen pain managed with Oxycodone 10 mg X 1 and Dilaudid 0.5 mg X 1 IVP for BTP    Scheduled Reglan Q8H for motility   No emesis overnight    No acute changes or events overnight    BP 94/62  ~ Pulse 75  ~ Temp 36.3 C (97.3 F) (Axillary)  ~ Resp 12  ~ Wt 61.2 kg (135 lb)  ~ SpO2 93%  ~ BMI 23.17 kg/m    Possible D/C home today or tomorrow with plans to follow up with Dr. [name] outpatient

## 2020-01-29 NOTE — Progress Notes
Pharmaceutical Services  Discharge Medication Reconciliation Note    Patient Name: Donna Gross  Medical Record Number: [id number]  Admit date: 01/24/2020 3:53 PM    Age: 54 y.o.  Sex: female  Allergies: Hydrocodone, Ibuprofen, and Morphine    Discharge Medication List:     Changes To My Medications        START taking these medications        Dose Instructions   metoclopramide 10 mg tablet  Commonly known as: Reglan   10 mg   Take 1 tablet (10 mg total) by mouth three (3) times daily before meals.     multivitamin tablet   1 tablet   Take 1 tablet by mouth daily.     ondansetron ODT 4 mg disintegrating tablet  Commonly known as: Zofran ODT   4 mg   Dissolve 1 tablet (4 mg total) on or under the tongue every six (6) hours as needed for Nausea or Vomiting.            CHANGE how you take these medications        Dose Instructions   aluminum-magnesium hydroxide-simethicone 400-400-40 mg/5 mL suspension  What changed: reasons to take this   10 mL   Take 10 mLs by mouth every four (4) hours as needed.     oxyCODONE 5 mg tablet  What changed:   how much to take  how to take this  when to take this  reasons to take this  additional instructions   5 mg   Take 1 tablet (5 mg total) by mouth every six (6) hours as needed for Moderate Pain (Pain Scale 4-6) or Severe Pain (Pain Scale 7-10). Max Daily Amount: 20 mg            CONTINUE taking these medications        Dose Instructions   clobetasol 0.05% cream  Commonly known as: Temovate    Apply topically three (3) times daily as needed (rash).     FLUoxetine 40 mg capsule  Commonly known as: PROzac   40 mg   Take 40 mg by mouth daily.     HYDROcodone-acetaminophen 5-325 mg tablet  Commonly known as: Norco   1 tablet   Take 1 tablet by mouth every six (6) hours as needed for Severe Pain (Pain Scale 7-10).     ipratropium-albuterol 20-100 mcg/act inhaler  Commonly known as: Respimat   2 puff   Inhale 2 puffs three (3) times daily as needed (SOB). lidocaine viscous 2% oral solution  Commonly known as: Xylocaine   5 mL   Swish and spit 5 mLs every four (4) hours as needed (indigestions) .     Naloxone HCl 4 MG/0.1ML Liqd    Call 911. Administer a single spray intranasally into one nostril for opioid overdose. May repeat in 3 minutes if patient is not breathing..     pantoprazole 40 mg DR tablet  Commonly known as: Protonix   40 mg   Take 40 mg by mouth daily .     promethazine 6.25 mg/5 mL solution  Commonly known as: Phenergan   6.25 mg   Take 6.25 mg by mouth every six (6) hours as needed (cough).     QUEtiapine 100 mg tablet  Commonly known as: SEROquel   100 mg   Take 1 tablet (100 mg total) by mouth two (2) times daily.     sucralfate 1 g/10 mL suspension  Commonly known as: Carafate  1 g   Take 10 mLs (1 g total) by mouth three (3) times daily with meals and at bedtime.     traZODone 100 mg tablet   100 mg   Take 100 mg by mouth at bedtime.     WIXELA INHUB 250-50 mcg/dose diskus  Generic drug: fluticasone-salmeterol   1 puff   Inhale 1 puff two (2) times daily as needed (SOB).            STOP taking these medications        STOP taking these medications   albuterol (2.5 mg/9mL) 0.083% nebulizer solution  Commonly known as: Proventil, Ventolin                Prescriptions        These medications were sent to [facility] ([facility]) [phone])  [address], [place] [address]      Hours: Mon-Fri 8AM-6PM, Saturdays & Holidays 8AM-5PM (Closed 1PM-2PM for lunch); Closed Sundays Phone: [phone]   metoclopramide 10 mg tablet  multivitamin tablet  ondansetron ODT 4 mg disintegrating tablet  oxyCODONE 5 mg tablet           Reconciliation  I have reviewed and reconciled the discharge medication orders with both inpatient orders and PTA medication list. No issues requiring follow up at this time.    Alberteen Spindle, PharmD  01/29/2020, 12:41 PM

## 2020-01-29 NOTE — Other
Patients Clinical Goal:   Clinical Goal(s) for the Shift: vss, safety, comfort, n/v and pain management  Identify possible barriers to advancing the care plan: None  Stability of the patient: Moderately Stable - low risk of patient condition declining or worsening   End of Shift Summary:      A/o x4, no signs of sob or distress    Continuous pulse ox monitoring sating above 93% on room air while resting   Pulse oximeter w/probes and mask delivered to bedside, explained to patient how to use it   Assisted pt with downloading the app and creating an account on mychart   Discharge medications given to patient upon discharge    All belonging sent home with patient   Discharge instructions provided and all questions answered

## 2020-01-29 NOTE — Telephone Encounter
Donna Gross- [id number] has been scheduled     PCP- 2/11 at 12:45pm  Dr. Donella Stade   Telephone encounter     General Surgery- 3/2 at 3:30pm Dr. Prentice Docker   Video Visit     Gastro  Dr. Riccardo Dubin (pending Auth)  [place] under [place] [employer] [place], referral request has been faxed to [facility]    Confirmed w/pt at [phone] activation code was sent via text; she will sign up for [facility]   Appt letters will be mailed out       [facility]

## 2020-01-29 NOTE — Other
Patients Clinical Goal:   Clinical Goal(s) for the Shift: safe mobility, O2 sat >93%, monitor s/s of infection, pain mgmt  Identify possible barriers to advancing the care plan: None  Stability of the patient: Moderately Unstable - medium risk of patient condition declining or worsening    End of Shift Summary:   PRECAUTIONS:  None  VS:  Stable, afebrile. Off covid meds.  PAIN:  Abd pain (greatest after eating) 7-10/10 managed with oxycodone 10mg  x 2 and dilaudid 0.5 IV x 2  NEURO:  A&O x 4 with good affect, able to mobilize all extremities with full sensation.  CARDIO:  Non-monitored, stable HR, regular rhythm  RESP:  O2 sat 94-95% on RA while at rest and 89-93% on RA while ambulating. Pt continues to c/o DOE. Lung sounds clear/diminished.   GI / GU:  LBM 2/6. Tolerating MS diet with fair appetite, though c/o pain after eating. Voiding with no issues  SKIN:  No issues  AMB:  BMAT 4, ambulatory in room  LINES/DRAINS:  Right arm PIV flushed and saline locked.    Endorsements For Oncoming Shift   Pt with 1 + covid test on 2/2 and 2 negative after. Per protocol, pt to remain on enhanced droplet/contact precautions x 10 days from last + test (until 2/12).    Plan Of Care  Continue to monitor respiratory function with goal >92% on RA. Continue pain management. DC home when medically stable.

## 2020-01-29 NOTE — Consults
INPATIENT CLINICAL CASE MANAGER COVID19 PROGRESS NOTE      Pt's clinical course and dispo plan reviewed during Interdisciplinary Rounds.      Plan: Continue to monitor the patient.    Stable, anticipated dc today, pending removal of O2 and ambulatory O2 sat documented.       COVID Assessment:      Date tested COVID-19 POSITIVE:  01/23/2020     (2/3 & 2/5 negative)         Onset of Symptoms: 01/23/2020                                      Risk factors for exposure include:              []  No risks identified                [x]  Chronic illness/ immunocompromised                      []  Homeless                []  Any Travel, especially high risk area's within the last 2 months,  []  Research scientist (physical sciences).  []  Recent contact with confirmed COVID(+) patient, family members, and/ or private caregiver.   []  Resides at [facility] university [facility] with other residents/ healthcare staff /students who tested positive for COVID 19 or lives in a community (high risk) environment.     Last Fever:      afebrile                                                                   Last administration of an Antipyretic:   []  N/A    [x]  Acetaminophen 01/27/2020    []  Ibuprophen   []  Ketoralac   []  ASA                                                                                         Oxygenation status:    97% 1L N/C            Therapies:     []  Remdesivir  []  Leronlimab  [x]  Corticosteroids ~  [x]  Anticoagulants []  Anti-infectives                                                                                                    DISCHARGE FACILITY TRANSFER/PLACEMENT  INFORMATION:        Living Environment and Community Support/Resources:   [x]  Home                        [x]  Family   []  Facility                      []  Friends  []  Conserved or DPOA          Anticipated DC Date /Location:      Home HHA:  [x] No      []  Yes:    DME: [x] Pulse Ox              []  O2 Portable bedside delivery            []   Home O2 Concentrator delivered to home            []   Other:    [x]   Afebrile for 24 hours w/o use of antipyretics, resolving symptoms, medical clearance to transfer,     [x]  Patients with severe to critical illness or who are severely immunocompromised  ? At least 10 days and up to 20 days have passed since symptoms first appeared and  ? At least 24 hours have passed since last fever without the use of fever-reducing medications and  ? Symptoms (e.g., cough, shortness of breath) have improved  ? Consider consultation with infection control experts  Severe Illness: Those with a respiratory frequency >30 breaths per minute, SpO2 <94% on room air at sea level (or, for patients with chronic hypoxemia, a decrease from baseline of >3%), ratio of arterial partial pressure of oxygen to fraction of inspired oxygen (PaO2/FiO2) <300 mmHg, or lung infiltrates >50%    Transportation:    [x]  Case Manager verified Enhanced Droplet Precautions:   [x]  Required    []  NOT required     [x]  Private vehicle      Magazine features editor MSN  Inpatient Clinical Case Manager   Department of Clinical Care Coordination and Social Services  [facility]   [address]  [place], [place] [address]    M: [phone]   O: #[id number]  E:  [email or web address]

## 2020-01-29 NOTE — Telephone Encounter
01/29/20-I called patient because I did not see she her COVID test appt. Per pt, she was admitted to [facility] on 01/23/20. She stated she did call and spoke with a female. We never got the message. Patient will call back to reschedule when better.

## 2020-01-29 NOTE — Consults
FINAL DISCHARGE MULTIDISCIPLINARY NOTE  Department of Care Coordination      Admit [phone]  Anticipated Date of Discharge: 01/29/2020    Following WU:[id number], Lindwood Qua, MD    Home [address] [place] [address]  [place] [address]      DISCHARGE INFORMATION:     Discharge Address: [address], [place] [address]    Individual(s) notified of discharge plan:  Contact Name: Bernise, Sylvain Relationship: Self   Contact Number(s): [phone]      Is patient/family informed of discharge?: Yes Is patient/family agreeable of discharge destination?: Yes     Support Systems: Family     Final Discharge Needs: Pulse Oximeter  Pulse Oximeter Provided: Yes  Current Living Situation: Lives w/Family    Patient Transfer Criteria from [facility] Discharge Rules for Patients with Laboratory Confirmed COVID-19    (home' includes non-congregate/non-healthcare settings)  At least 10 days have passed since symptom onset AND no fever x 24 hours without the use of fever reducing medications AND symptoms have improved.  ? No restrictions. Patient is considered non-infectious for the purpose of discharge with continued home isolation for a total of 20 days since the onset of symptoms (if known) or FIRST positive COVID test result.    Less than 10 days since symptom onset, still febrile, or no improvement in symptoms  ? The patient is considered infectious for the purpose of discharge with continued home isolation for a total of 20 days since the onset of symptoms (if known) or the FIRST positive COVID test result.  ? Provide home isolation instructions   ? Advise any non-hospitalized household members to self-quarantine for at least 7-10 days after last contact with this patient.   ? Nursing to provide home quarantine instructions, face masks, and gloves prior to discharge.    If the patient lives with others and is not able to adequately self-isolate, advise: ? The quarantine period of all household members will be extended to 14 days after the end of the patient?s isolation period  Transportation to home by private conveyance or non-medical transport (do not use public transportation, no rideshare/taxi)   ? Patient should wear a surgical mask   ? The driver should wear a surgical mask (isolation gown is optional, if available)   ? Patient and driver should maintain a 6 ft distance at all times   ? Vehicle should be disinfected with approved disinfectant (e.g. disinfectant wipes) after transport and not used/aired out for 2 hours.  HitProtect.dk, 2020.   Zou et al. NEJM March 08, 2019, Karl Pock et al (2020) International Journal of Antimicrobial Agents, March 2020                                                                                                                                         Current evidence suggests that SARS-CoV-2 may  remain viable for hours to days on surfaces made from a variety of materials.                   Use of alcohol-based wipes or spray containing at least 70% alcohol to disinfect touch screens.                                  ?Prepare a bleach solution by mixing:                                         ?5 tablespoons (1/3rd cup) bleach per gallon of room temperature water or                                        ?4 teaspoons bleach per quart of room temperature water                                   ?Bleach solutions will be effective for disinfection up to 24 hours.                                     [facility] ([facility]), Division of Viral Diseases, July 09/2019                                                                    https://harris-mcgee.org/.html Disposition of Patients with COVID-19:  Patients can be discharged from the healthcare facility whenever clinically indicated.  If discharged to home:  ? Isolation should be maintained at home if the patient returns home before discontinuation of Transmission-Based Precautions.   ? The decision to send the patient home should be made in consultation with the patient?s clinical care team and local or state public health departments.   ? It should include considerations of the home?s suitability for and patient?s ability to adhere to home isolation recommendations.   ? Guidance on implementing home care of persons who do not require hospitalization and the discontinuation of home isolation for persons with COVID-19 is available.      Zou et al. NEJM March 08, 2019, Gautret et al [phone]). International Journal of Antimicrobial Agents, March NoiseShare.com.ee      Holli Humbles RN MSN  Inpatient Clinical Case Manager   Department of Clinical Care Coordination and Social Services  [facility]   [address]  [place], [place] [address]    M: [phone]   O: #[id number]  E:  [email or web address]

## 2020-01-30 ENCOUNTER — Ambulatory Visit: Payer: PRIVATE HEALTH INSURANCE

## 2020-01-30 LAB — MTB-Quantiferon Gold Plus ELISA: MTB-QUANTIFERON GOLD PLUS ELISA: NEGATIVE

## 2020-01-31 NOTE — Discharge Summary
Discharge Summary           Name: Donna Gross MRN: [id number] DOB: [birth year]   Admit Date:   01/24/2020   D/C Date: 01/29/2020 LOS:  LOS: 5 days    AdmittingAttending Bridgette Habermann, MD Discharge Attending Earlean Polka, MD    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 female with history of complex peptic ulcer disease s/p multiple surgeries, last in July 2020 (laparoscopic vagotomy and antrectomy, gastrojejunostomy, and hiatal hernia repair c/b ongoing nausea, vomiting, dysphagia, poor po tolerance for which follows with general surgery  (last seen 11/24/19) who presents for same. Pt reports she's has chronic abdominal pain which she states worsened since her surgery in July 2020. Pt states she feels food gets stuck in her esophagus worsening her nausea and often causing nbnb vomiting which limits her po intake and is causing her to loose weight. She states she can mainly tolerate liquids. She is passing gas and having BMs. She notes she was recently at [facility] for same. She denies any infectious s/s. No fever, chills, dyspnea, any dysuria or diarrhea.   ?  In ED, avss, sao2 98% on RA. Labs significant for glucose 40. COVID pcr positive. CXR with streaky LLL opacity likely atelectasis. 2 view abd xray normal. RUQ [place] with GB distention, similar to 07/2019; new small amount of sludge layers at gb neck, increased CBD 1.1, prior 7mm. Gen surg consulted, no surgical intervention indicated, recommend advance diet as tolerated, inpatient esophagus burger study and GI consult for endoscopy.[facility] Course: General Surgery and GI teams consulted on admission. Patient underwent Burger esophagram without obvious structural or motility abnormalities. MRCP was performed and showed mild biliary and pancreatic ductal dilatation, similar in appearance dating back to 2016, without a detectable cause of obstruction. EGD was performed and revealed normal esophagus, stomach anatomy consistent with Billroth II, patent anastomosis, and normal duodenum. Patient developed transaminitis, which was thought to be DILI. Methocarbamol was discontinued, seroquel held- Hep panel negative, LFTs downtrended. Patient tolerated mechanical soft diet, and her nausea and pain were well controlled.    Patient's 2/02 covid-19 pcr was positive. She was asymptomatic. COVID-19 pcr repeated on 2/03 and 2/05 and were negative. First test though to represent low level colonization. She was advised on 10-day quarantine for mild disease.    TTE was performed and showed normal EF, normal diastolic function. Patient's home inhalers were continued. SpO2 at rest and with exertion normal on room air prior to discharge.     Procedures & Operations  Performed:     2/05 ERCP:  FINDINGS:   Esophagus: normal mucosa, no mass, no ulcer, no esophagitis, no stricture.   Stomach: anatomy consistent with Billroth II, patent anastomosis  Duodenum: normal    Relevant Imaging Studies (most recent results only):     2/5 MRCP  Mild biliary and pancreatic ductal dilatation, similar in appearance dating back to 2016, without a detectable cause of obstruction.  ?  2/4: Burger esophagram  Esophageal caliber: Normal with no focal narrowing.  Intraluminal filling defects: None.  Esophageal motility: Normal.  Swallowing of barium coated sandwich: Normal with no stasis. There was adherent remnant barium sandwich coating the esophagus, which cleared rapidly following ingestion of water.  Hiatal hernia: Small.  Gastroesophageal reflux: None observed.  IMPRESSION: ? Ingestion of barium contrast demonstrated normal motility without stasis. Adherent remnant barium sandwich coated the esophagus, which cleared easily following ingestion of water. Small hiatal hernia present..  ?  2/2: RUQ [place]  1. ?As compared to the CT of 08/09/2019, common bile duct distention appears more pronounced, wall gallbladder distention and mild pancreatic ductal enlargement do not appear significantly changed.  2. ?A small amount of nonobstructive sludge layers in the gallbladder neck. No visible debris is seen within the common bile duct, however the distal duct is not well-visualized.  ?  2/2 CXR: Streaky left basilar opacity is favored to be atelectatic    Relevant labs:   BMP:   Lab Results   Component Value Date/Time    NA 137 01/29/2020 01:42 PM    K 4.3 01/29/2020 01:42 PM    CL 101 01/29/2020 01:42 PM    CO2 27 01/29/2020 01:42 PM    BUN 13 01/29/2020 01:42 PM    CREAT 1.03 01/29/2020 01:42 PM    CALCIUM 9.3 01/29/2020 01:42 PM    GLUCOSE 88 01/29/2020 01:42 PM       CBC:   Lab Results   Component Value Date/Time    WBC 9.84 01/29/2020 01:42 PM    HGB 8.5 (L) 01/29/2020 01:42 PM    HCT 29.2 (L) 01/29/2020 01:42 PM    PLT 448 (H) 01/29/2020 01:42 PM        Micro  2/02 COVID-19 pcr positive  2/03 COVID-19 pcr negative  2/05 COVID-19 pcr negative    Physical Exam: BP 100/66  ~ Pulse 78  ~ Temp 36.3 ?C (97.4 ?F) (Axillary)  ~ Resp 17  ~ Wt 61.2 kg (135 lb)  ~ SpO2 94%  ~ BMI 23.17 kg/m?     General appearance: alert, chronically ill appearing, in no distress  Neck: no JVD and supple, symmetrical, trachea midline  Lungs: normal work of breathing, no accessory muscle use  Heart: regular rate and rhythm, S1, S2 normal, no murmur, click, rub or gallop  Abdomen:soft, multiple old scars present, mild diffuse tenderness without rebound or guarding  Extremities: extremities normal, atraumatic, no cyanosis or edema  Skin: Skin color, texture, turgor normal. No rashes or lesions  Neurologic: Grossly normal Outpatient Provider To-Do List   - LFTs in 1-2 weeks  - Follow up with GI in 2-4 weeks for consideration of manometry  - Follow up with General Surgery Dr. [name]       Pending labs   Unresulted Labs (From admission, onward)     Start     Ordered    01/25/20 0820  Methylmalonic Acid,Serum  Once,   Routine      01/25/20 0815  Discharge medications High-risk medications      Medication List      START taking these medications    metoclopramide 10 mg tablet  Commonly known as: Reglan  Take 1 tablet (10 mg total) by mouth three (3) times daily before meals.     multivitamin tablet  Take 1 tablet by mouth daily.     ondansetron ODT 4 mg disintegrating tablet  Commonly known as: Zofran ODT  Dissolve 1 tablet (4 mg total) on or under the tongue every six (6) hours as needed for Nausea or Vomiting.        CHANGE how you take these medications    aluminum-magnesium hydroxide-simethicone 400-400-40 mg/5 mL suspension  Take 10 mLs by mouth every four (4) hours as needed.  What changed: reasons to take this     oxyCODONE 5 mg tablet  Take 1 tablet (5 mg total) by mouth every six (6) hours as needed for Moderate Pain (Pain Scale 4-6) or Severe Pain (Pain Scale 7-10). Max Daily Amount: 20 mg  What changed:   ? how much to take  ? how to take this  ? when to take this  ? reasons to take this  ? additional instructions        CONTINUE taking these medications    clobetasol 0.05% cream  Commonly known as: Temovate     FLUoxetine 40 mg capsule  Commonly known as: PROzac     HYDROcodone-acetaminophen 5-325 mg tablet  Commonly known as: Norco     ipratropium-albuterol 20-100 mcg/act inhaler  Commonly known as: Respimat     lidocaine viscous 2% oral solution  Commonly known as: Xylocaine     Naloxone HCl 4 MG/0.1ML Liqd  Call 911. Administer a single spray intranasally into one nostril for opioid overdose. May repeat in 3 minutes if patient is not breathing..     pantoprazole 40 mg DR tablet  Commonly known as: Protonix promethazine 6.25 mg/5 mL solution  Commonly known as: Phenergan     QUEtiapine 100 mg tablet  Commonly known as: SEROquel  Take 1 tablet (100 mg total) by mouth two (2) times daily.     sucralfate 1 g/10 mL suspension  Commonly known as: Carafate  Take 10 mLs (1 g total) by mouth three (3) times daily with meals and at bedtime.     traZODone 100 mg tablet     WIXELA INHUB 250-50 mcg/dose diskus  Generic drug: fluticasone-salmeterol        STOP taking these medications    albuterol (2.5 mg/36mL) 0.083% nebulizer solution  Commonly known as: Proventil, Ventolin           Where to Get Your Medications      These medications were sent to [facility] ([facility]) [phone])  [address] Room [place] [address]    Hours: Mon-Fri 8AM-6PM, Saturdays & Holidays 8AM-5PM (Closed 1PM-2PM for lunch); Closed Sundays Phone: [phone] ?   metoclopramide 10 mg tablet  ? multivitamin tablet  ? ondansetron ODT 4 mg disintegrating tablet  ? oxyCODONE 5 mg tablet      This patient does not have coumadin on their discharge med list    Controlled meds:   Controlled Medications     Opioid Agonists Disp Start End     oxyCODONE 5 mg tablet    20 tablet 01/29/2020     Sig - Route: Take 1 tablet (5 mg total) by mouth every six (6) hours as  needed for Moderate Pain (Pain Scale 4-6) or Severe Pain (Pain Scale 7-10). Max Daily Amount: 20 mg - Oral    Earliest Fill Date: 01/29/2020    Opioid Combinations Disp Start End     HYDROcodone-acetaminophen 5-325 mg tablet          Sig - Route: Take 1 tablet by mouth every six (6) hours as needed for Severe Pain (Pain Scale 7-10). - Oral    Class: Historical Med           Nutrition recommendations & malnutrition assessment   -When medically feasible, recommend advance to regular diet.  -Recommend add multivitamin with minerals to ensure micronutrient adequacy. -Recommend d/c Boost Glucose Control. Add Ensure Enlive (350kcal and 20g protein per serving) x3/day for greater kcal/protein. Continue trial of Boost Pudding upon diet advancement.    RD to follow.    Author:  Sherley Bounds, RD, pager [phone]   01/25/2020 10:38 AM    Level of Malnutrition: Unable to complete Malnutrition assessment at this time           Discharge diet orders    Diet mechanical soft   As directed           Rehab assessment   No PT evaluation this encounter             Discharge activity orders    Activity as tolerated   As directed           Requested appointments Scheduled appointments      We will schedule follow up appointment(s) and call you with appointment   time(s)   As directed      Clinic/Test/Procedure to be scheduled: PCP  Doctor Preference (If applicable):[facility], [facility]  Time Frame:1 week, Covid +, isolate until 02/02/20  Reason for Appointment/Procedure: post-hospitalization follow-up for   abdominal pain    Clinic/Test/Procedure to be scheduled:GS  Doctor Preference (If applicable):Chen  Time Frame:1-2 weeks  Reason for Appointment/Procedure:post-hospitalization follow-up for s/p ex   lap, abd pain    Clinic/Test/Procedure to be scheduled:GI  Doctor Preference (If applicable):sassi  Time Frame:Blank single:2-3 weeks  Reason for Appointment/Procedure:post-hospitalization follow-up for   dysphagia, manometry planning     Future Appointments   Date Time Provider Department Center   02/20/2020  3:30 PM Alonna Buckler., MD SUR GEN 102 SURGERY        Case Manager/Home Health assessment   Discharge Information  Discharge Address: [address] [place], [place] [address] [id number]/05/21 1809)  Contact Name: Porscha, Axley (01/29/20 1311)  Relationship: Self (01/29/20 1311)  Contact Number(s): [phone] (01/29/20 1311)  Is patient/family informed of discharge?: Yes (01/29/20 1311)  Is patient/family agreeable of discharge destination?: Yes (01/29/20 1311) Support Systems: Family (01/29/20 1311)  Final Discharge Needs: Pulse Oximeter (01/29/20 1311)  Pulse Oximeter Provided: Yes (01/29/20 1311)                         Home Health orders   There are no active discharge home health orders for this encounter.      Goals of Care Note (if new for this encounter)         LACE+ Risk Stratification    Total Score Risk Stratification   0-28 Minimal Risk   29-58 Moderate Risk   59-78 High Risk   79-90 Highest Risk     Readmission Score  53       Total Score        15 Urgent Admission    6 Length of Stay    3 ED Visits in Previous 6 Months    29 Charlson Score (by age & number of urgent admissions)        Criteria that do not apply:    Female Patient    Discharge Institution    Alternative Level of Care Status    Elective Admission in Previous Year          I have seen and examined the patient on their discharge day. I have reviewed, edited, and agree with the above discharge summary. More than 30 minutes was personally spent on discharge planning on the day of discharge in coordination of care, counseling the patient and preparation of discharge.    Earlean Polka, MD  01/30/2020 4:15 PM

## 2020-02-01 ENCOUNTER — Ambulatory Visit: Payer: PRIVATE HEALTH INSURANCE

## 2020-02-01 DIAGNOSIS — Z23 Encounter for immunization: Secondary | ICD-10-CM

## 2020-02-06 IMAGING — NM Imaging study
1 series · 6 of 6 positions shown · non-contrast
Comparison: none

Exam: Nuclear medicine gastric emptying study.
REASON FOR EXAM: Esophageal reflux
TECHNIQUE: A conventional nuclear medicine gastric imaging study was performed after the oral administration of 3.1 millicuries of sulfur colloid in scrambled eggs.  The T1 half emptying time was subsequently measured.

[Series 1000: gastric emptying · 2.70mm/px · 6 of 90 frames shown]
[frame 8/90]
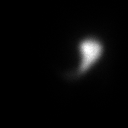
[frame 23/90]
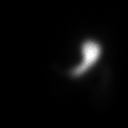
[frame 38/90]
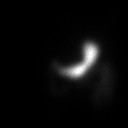
[frame 53/90]
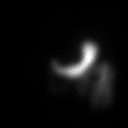
[frame 68/90]
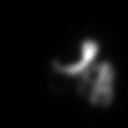
[frame 83/90]
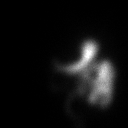

[6 of 6 positions shown; findings below may reference images not displayed]

FINDINGS: A T1 half time of 69 minutes is calculated.
IMPRESSION: Normal study.
Location:1
Is the patient pregnant?
Unknown

## 2020-02-20 ENCOUNTER — Telehealth: Payer: PRIVATE HEALTH INSURANCE

## 2020-02-20 DIAGNOSIS — R1310 Dysphagia, unspecified: Secondary | ICD-10-CM

## 2020-03-12 NOTE — Progress Notes
DATE OF SERVICE:  02/20/2020     Donna Gross is a very pleasant 55 year old woman. She is very well-known to me. She had refractory ulcer disease and in July of 2020, I performed a laparoscopic vagotomy and antrectomy with a gastrojejunostomy and hiatal hernia repair. She had extensive adhesions of her intraabdominal cavity with complete encasement of the entirety of her visceral block, we meticulously adhesiolysed every last bit of her small intestine and large intestine in order to perform this vagotomy and antrectomy for this severe refractory ulcer. She has had ongoing complaints of nausea, vomiting, and dysphagia along with chronic abdominal pain. She does feel that food gets stuck in her esophagus from time to time. She otherwise has been eating. She has been admitted to the hospital several times, each time with no evidence of any specific obstruction. We have followed her on each of these admissions. She has no anatomic source for her workups and for her symptoms and most of this has been deemed to be functional. We had discussed nutrition consultation for a diet, but otherwise there are no further surgical issues that we identified. She did have an upper GI swallow in 02/04, demonstrating normal motility without stasis. There was no evidence of any hiatal hernia. No reflux and otherwise no dysmotility noted. She was referred to me for a followup appointment given her discharge from the hospital. She otherwise reports that she is doing relatively fine with regard to eating. She does continue to have ongoing nausea and abdominal complaints. These are consistent with her chronic issues, but much-improved from her preoperative state with her peptic ulcer disease. I discussed with [name] that, at this point, she does not have any specific anatomic abnormalities that we are concerned about. She definitely has had multiple operations and has dealt with chronic abdominal pain for several years. Her studies demonstrate, though, that she has adequate passage of these and, overall, she reports that her symptoms have been improved.     I discussed with her that she should continue to follow up with her gastroenterology team and otherwise may contact me should any issues arise.      Dineen Kid [name], MD [phone])        DCC/MODL CONF#: [id number]  D: 03/11/2020 17:30:30 T: 03/12/2020 08:33:31 DOCUMENT: [id number]

## 2020-03-12 NOTE — Progress Notes
Dictated.   Upper GI with no problems with passage. Given symptoms, will try carafate for symptoms. Formal manometry may evaluate for esophageal dysmotility.

## 2020-06-03 IMAGING — MG MAMMO BREAST SCREENING BILATERAL
8 of 12 series · 8 of 28 positions shown · non-contrast
Comparison: none

This is a summary report. The complete report is available in the patient's medical record. If you cannot access the medical record, please contact the sending organization for a detailed fax or copy.
EXAM:
Mammogram breast screening tomosynthesis bilateral
REASON FOR EXAM: screening
HISTORY: Patient is 54 y.o. Family medical history includes diabetes in father, heart disease in father, and hypertension in father. Hormone history includes estrogen replacement therapy. Surgical history includes appendectomy and hernia repair. Medical history includes thyroid disease.
LMP: No LMP recorded. Patient is postmenopausal.
BREAST COMPOSITION:
The breasts have scattered areas of fibroglandular density.

[L CC synth-2D]
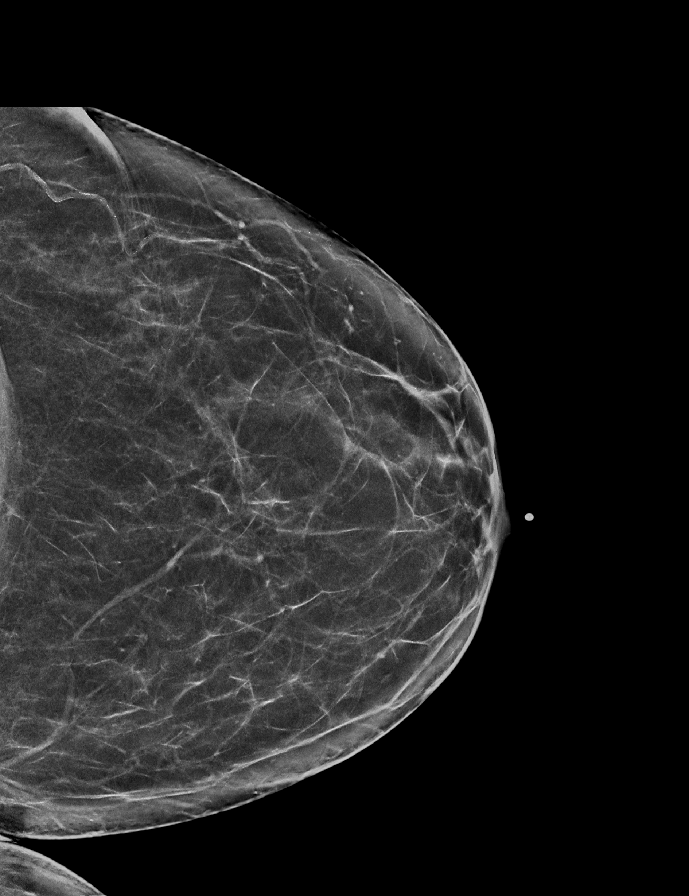

[R CC]
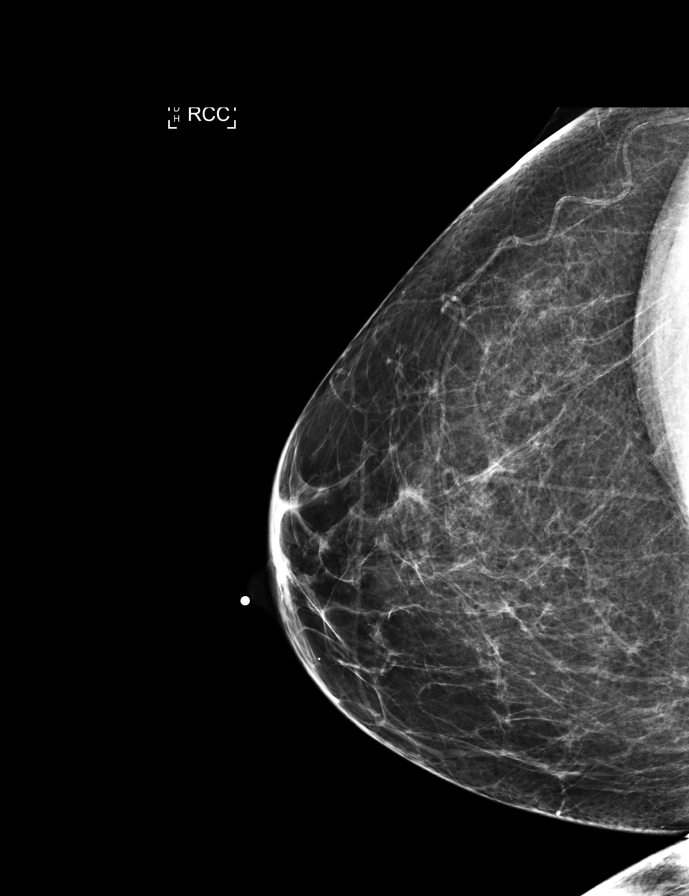

[L CC]
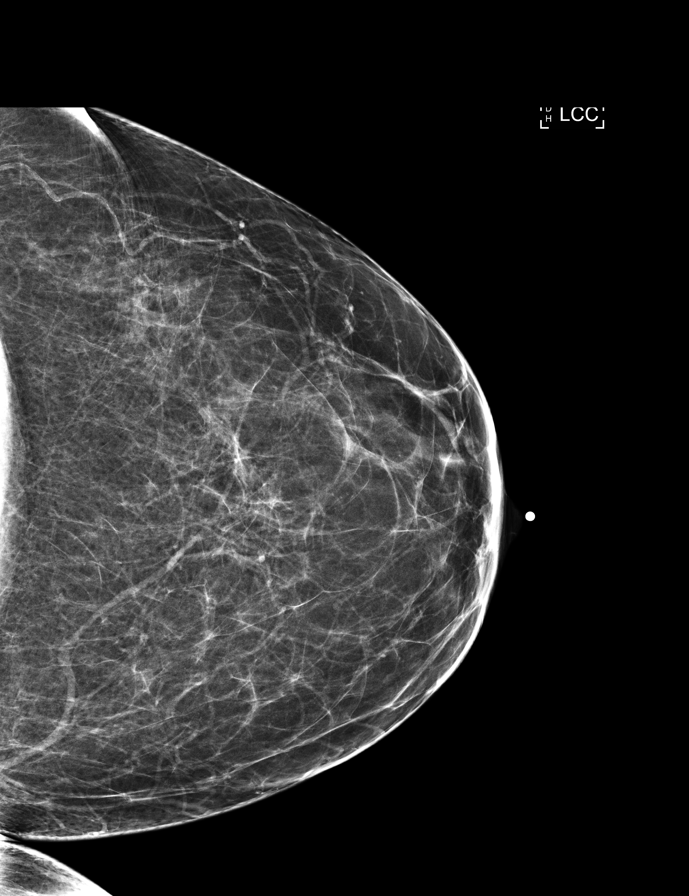

[R CC synth-2D]
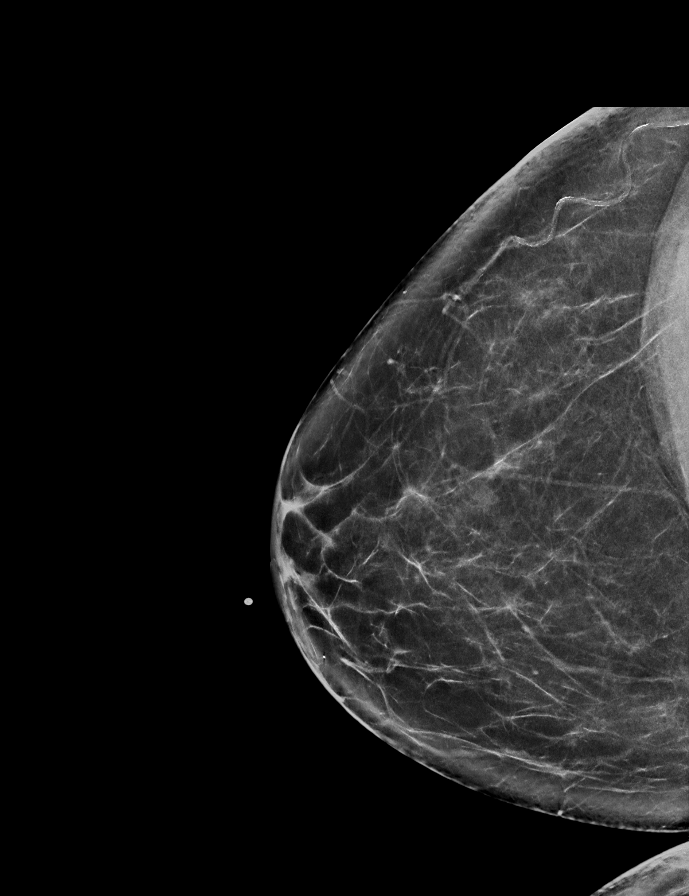

[R MLO synth-2D]
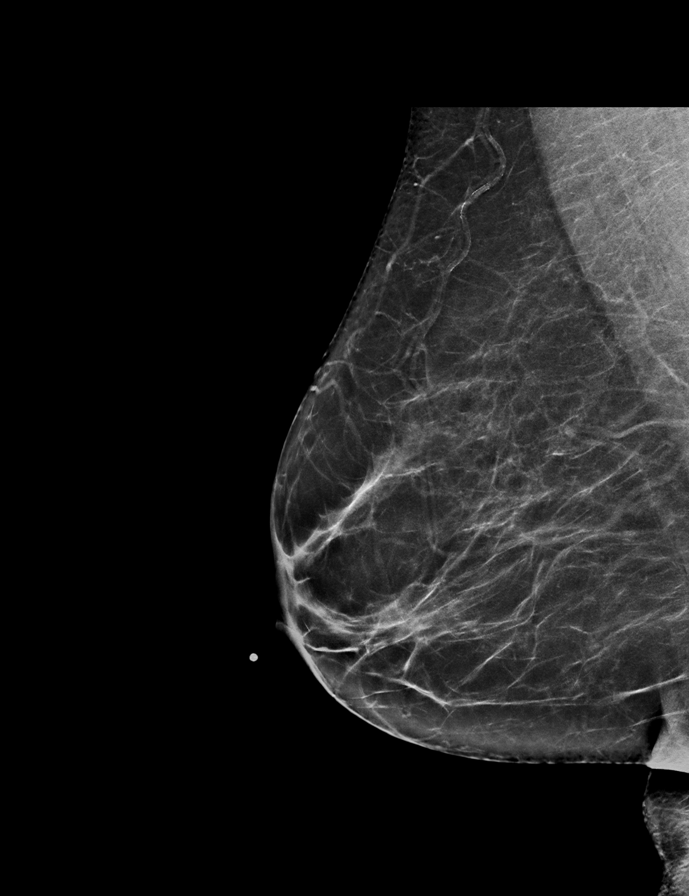

[L MLO synth-2D]
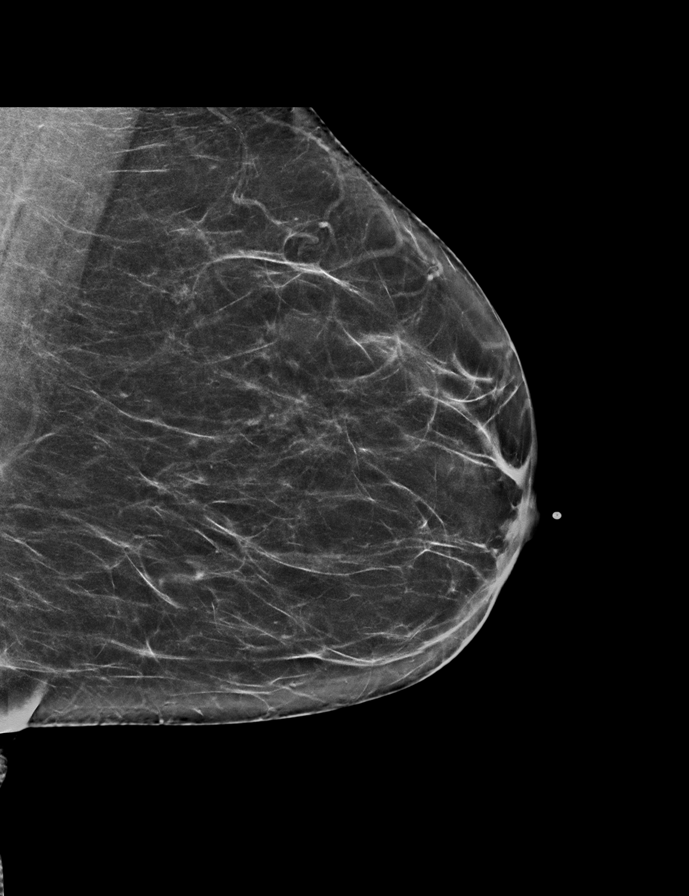

[L MLO]
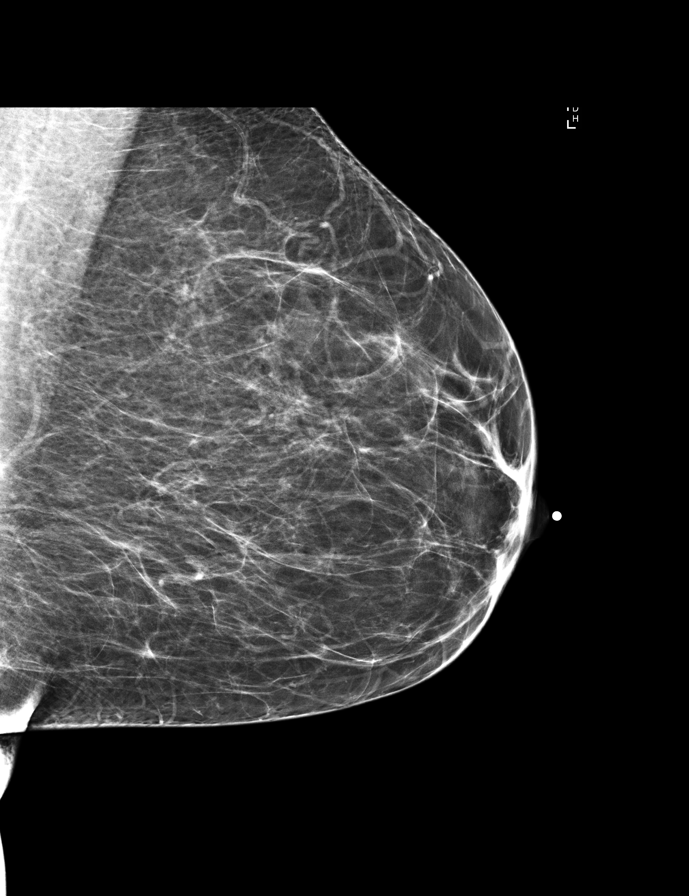

[R MLO]
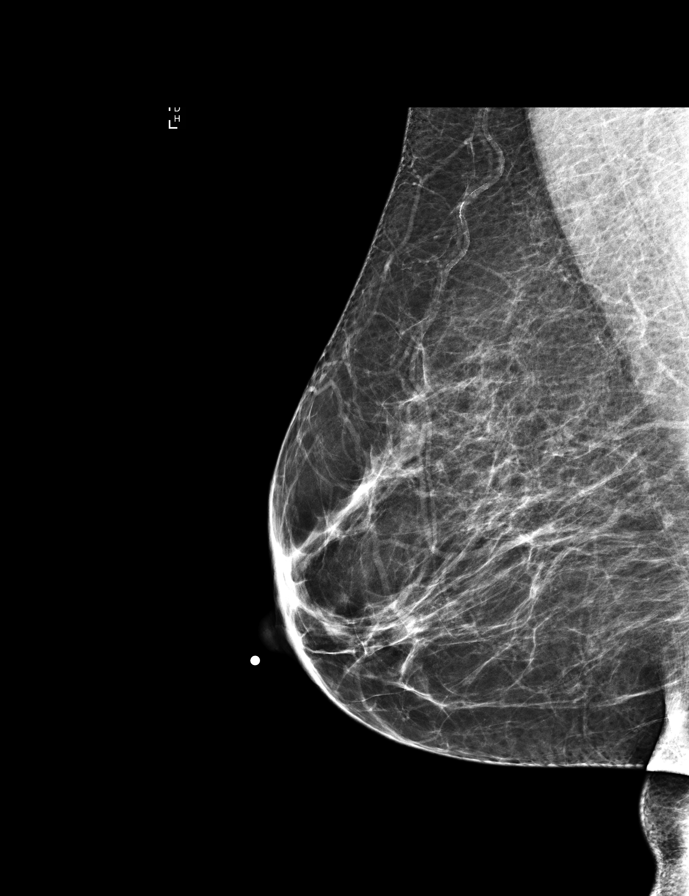

[8 of 28 positions shown; findings below may reference images not displayed]

FINDINGS: Computer-aided detection was utilized by the radiologist in the interpretation of this examination. This procedure was performed using Tomosynthesis.
There is no evidence of suspicious masses, calcifications, or other abnormal findings.
FILMS COMPARED:
02/24/2017 DIGITAL MAMMO DIAGNOSTIC S5HST
02/24/2017 Mammogram breast diagnostic tomosynthesis bilateral
03/08/2018 SCREEN MAMMO DIR DIGITAL BIALT
06/01/2019 Mammogram breast screening tomosynthesis bilateral
IMPRESSION: No mammographic evidence of malignancy.
BI-RADS CATEGORY:
Overall: 2 - Benign
RECOMMENDATION:
- Routine Screening Mammogram in 1 Yr.
LOCATION CODE: 1
Is the patient pregnant?
No

## 2020-11-23 ENCOUNTER — Inpatient Hospital Stay
Admit: 2020-11-23 | Discharge: 2020-11-23 | Disposition: A | Discharge status: Home / Self Care | Payer: PRIVATE HEALTH INSURANCE | Source: Home / Self Care

## 2020-11-23 DIAGNOSIS — R1319 Other dysphagia: Secondary | ICD-10-CM

## 2020-11-23 DIAGNOSIS — R1013 Epigastric pain: Secondary | ICD-10-CM

## 2020-11-23 LAB — Differential Automated: ABSOLUTE NEUT COUNT: 2.57 10*3/uL (ref 1.80–6.90)

## 2020-11-23 LAB — CBC: ABSOLUTE NUCLEATED RBC COUNT: 0 10*3/uL (ref 0.00–0.00)

## 2020-11-23 LAB — Amylase: AMYLASE: 84 U/L (ref 31–124)

## 2020-11-23 LAB — Bilirubin,Conjugated: BILIRUBIN,CONJUGATED: <0.2 mg/dL (ref <=0.3)

## 2020-11-23 LAB — Basic Metabolic Panel
CREATININE: 1.08 mg/dL (ref 0.60–1.30)
GLUCOSE: 98 mg/dL (ref 65–99)

## 2020-11-23 LAB — Troponin I: TROPONIN I: <0.04 ng/mL (ref <0.05)

## 2020-11-23 LAB — Extra Light Green Top

## 2020-11-23 LAB — Aspartate Aminotransferase: ASPARTATE AMINOTRANSFERASE: 44 U/L (ref 13–62)

## 2020-11-23 LAB — Alanine Aminotransferase: ALANINE AMINOTRANSFERASE: 50 U/L (ref 8–70)

## 2020-11-23 LAB — Lipase: LIPASE: 29 U/L (ref 13–69)

## 2020-11-23 LAB — Albumin: ALBUMIN: 4.7 g/dL (ref 3.9–5.0)

## 2020-11-23 LAB — Alkaline Phosphatase: ALKALINE PHOSPHATASE: 135 U/L — ABNORMAL HIGH (ref 37–113)

## 2020-11-23 MED ORDER — ONDANSETRON 4 MG PO TBDP
4 mg | ORAL_TABLET | Freq: Four times a day (QID) | ORAL | 0 refills | Status: AC | PRN
Start: 2020-11-23 — End: ?

## 2020-11-23 MED ADMIN — ONDANSETRON HCL 4 MG/2ML IJ SOLN: 4 mg | INTRAVENOUS | @ 15:00:00 | Stop: 2020-11-23 | NDC 60505613005

## 2020-11-23 MED ADMIN — HYDROMORPHONE HCL 1 MG/ML IJ SOLN: .5 mg | INTRAVENOUS | @ 15:00:00 | Stop: 2020-11-23 | NDC 00409128331

## 2020-11-23 MED ADMIN — SODIUM CHLORIDE 0.9 % IV BOLUS: 500 mL | INTRAVENOUS | @ 15:00:00 | Stop: 2020-11-23 | NDC 00338954304

## 2020-11-23 NOTE — ED Provider Notes
[facility]  Emergency Department Service Report    Donna Gross 54 y.o. female , presents with Dysphagia      Triage   Arrived on 11/23/2020 at 6:17 AM   Arrived by Walk-in [14]    ED Triage Vitals [11/23/20 0621]   Temp Temp src BP Heart Rate Resp SpO2 O2 Device Pain Score Weight   36.8 ???C (98.3 ???F) -- 124/91 63 16 97 % None (Room air) Ten --       Pre hospital care:       Allergies   Allergen Reactions   ??? Hydrocodone Other (See Comments)     ''burns a hole in stomach''  Tolerates hydromorphone   ??? Ibuprofen Other (See Comments)     GI discomfort    ''hurts my stomach''   ??? Morphine Itching     Tolerates hydromorphone       History   Patient with difficulty swallowing; ongoing for more than one year; has had previous surgeries due to complicated peptic ulcers; now endorsing persistent epigastric abdominal pain, throbbing, moderate, worse with eating; better previously with oxycodone; no fevers or chills noted    The history is provided by the patient.            Past Medical History:   Diagnosis Date   ??? Anxiety    ??? Depression    ??? Emphysema, unspecified (HCC/RAF)    ??? Fibromyalgia    ??? Gastritis    ??? GERD (gastroesophageal reflux disease)    ??? Pancreatitis         Past Surgical History:   Procedure Laterality Date   ??? ABDOMINAL SURGERY     ??? COLON SURGERY          Past Family History   family history includes Diabetes in her brother and father; Heart disease in her mother.                 Past Social History   she reports that she has been smoking cigarettes. She has been smoking about 0.25 packs per day. She uses smokeless tobacco. She reports that she does not drink alcohol, does not use drugs, and does not engage in sexual activity.     Review of Systems   All other systems reviewed and are negative.      Physical Exam   Physical Exam  Vitals and nursing note reviewed.   Constitutional:       Appearance: Normal appearance. She is normal weight.   HENT:      Head: Normocephalic and atraumatic.      Right Ear: Tympanic membrane, ear canal and external ear normal.      Left Ear: Tympanic membrane, ear canal and external ear normal.      Nose: Nose normal.      Mouth/Throat:      Mouth: Mucous membranes are moist.      Pharynx: Oropharynx is clear.   Eyes:      Extraocular Movements: Extraocular movements intact.      Conjunctiva/sclera: Conjunctivae normal.      Pupils: Pupils are equal, round, and reactive to light.   Cardiovascular:      Rate and Rhythm: Normal rate and regular rhythm.      Pulses: Normal pulses.      Heart sounds: Normal heart sounds.   Pulmonary:      Effort: Pulmonary effort is normal.      Breath sounds: Normal breath sounds.  Abdominal:      General: Abdomen is flat. Bowel sounds are normal.      Palpations: Abdomen is soft.      Tenderness: There is abdominal tenderness (Epigastric).   Musculoskeletal:         General: Normal range of motion.      Cervical back: Normal range of motion and neck supple.   Skin:     General: Skin is warm.      Capillary Refill: Capillary refill takes less than 2 seconds.   Neurological:      General: No focal deficit present.      Mental Status: She is alert and oriented to person, place, and time. Mental status is at baseline.   Psychiatric:         Mood and Affect: Mood normal.         Behavior: Behavior normal.         Thought Content: Thought content normal.         Judgment: Judgment normal.         ED Course          Laboratory Results     Labs Reviewed   ALKALINE PHOSPHATASE - Abnormal; Notable for the following components:       Result Value    Alkaline Phosphatase 135 (*)     All other components within normal limits   CBC (PERFORMABLE) - Abnormal; Notable for the following components:    Red Cell Distribution Width-SD 52.2 (*)     Red Cell Distribution Width-CV 16.0 (*)     Mean Platelet Volume 8.4 (*)     All other components within normal limits   ALT (SGPT) - Normal   AST (SGOT) - Normal   BILIRUBIN,CONJ - Normal   ALBUMIN - Normal   LIPASE - Normal   AMYLASE - Normal   TROPONIN I - Normal   CBC & AUTO DIFFERENTIAL    Narrative:     The following orders were created for panel order CBC & Plt & Diff.  Procedure                               Abnormality         Status                     ---------                               -----------         ------                     ZOX[[id number]]                          Abnormal            Final result               Differential, Automated[[id number]]                          Final result                 Please view results for these tests on the individual orders.   BASIC METABOLIC PANEL   DIFFERENTIAL, AUTOMATED (PERFORMABLE)   RAINBOW DRAW TO LABORATORY  Narrative:     The following orders were created for panel order Rainbow Draw to Laboratory Clinton Gallant).  Procedure                               Abnormality         Status                     ---------                               -----------         ------                     Extra Burna Mortimer ZOX[[id number]]                            In process                   Please view results for these tests on the individual orders.   EXTRA LIGHT GREEN TOP       Imaging Results     No orders to display       Administered Medications     Medication Administration from 11/23/2020 0617 to 11/23/2020 8119       Date/Time Order Dose Route Action Action by Comments     11/23/2020 0726 ondansetron 4 mg/2 mL inj 4 mg 4 mg Intravenous Given Tempie Hoist, RN      11/23/2020 0726 HYDROmorphone 1 mg/mL inj 0.5 mg 0.5 mg IV Push Given Tempie Hoist, RN      11/23/2020 0726 sodium chloride 0.9% IV soln bolus 500 mL 500 mL Intravenous New Bag/ Syringe/ Cartridge Tempie Hoist, RN           Procedures   Procedural Sedation  Procedures    MDM  Data Reviewed/Counseling: I have reviewed the patient's vital signs and nursing notes. I had a detailed discussion with the patient regarding the historical points, exam findings, and any diagnostic results supporting the discharge diagnosis. I also discussed lab results, the need for outpatient follow-up and the need to return to the ED if symptoms worsen or if there are any questions or concerns that arise at home.    Patient presents with c/o dysphagia; extensive h/o the same; previously seen by Dr. [name]; notes reviewed; suspected to be functional; I ordered a CBC on this patient to assess for acute anemia and to determine the WBC as possible evidence of acute bacterial infection.   I ordered a basic metabolic panel on this patient to assess for hyperglycemia, acute renal failure, e/o dehydration, and to assess for possible other metabolic abnormalities such as hyponatremia, hyperkalemia, amongst others.   I ordered a hepatic panel and a lipase to assess for acute hepatitis, pancreatitis, biliary colic, biliary obstruction, amongst others. Given IVFs, IV zofran and IV dilaudid and is feeling moderately better; normal serum WBC noted; normal lipase; No evidence of a surgical abdomen noted at this time and while I considered a CT abdomen/pelvis, I do not currently feel it is necessary based on the patient's physical exam and clinical history and review of any labs that were ordered.  Patient is otherwise well appearing; feel it is reasonable to discharge the patient home at this time with close  outpatient follow up given and strict return precautions provided.       Clinical Impression     1. Abdominal pain, acute, epigastric    2. Esophageal dysphagia        Prescriptions     New Prescriptions    ONDANSETRON ODT 4 MG DISINTEGRATING TABLET    Take 1 tablet (4 mg total) by mouth every six (6) hours as needed for Nausea or Vomiting.       Disposition and Follow-up   Disposition: Discharge [1]    No future appointments.    Follow up with:  [facility]  [address] [place] [address]  [phone]    In 1 week        Return precautions are specified on After Visit Summary.                 Pablo Ledger, MD  11/23/20 [phone]

## 2020-11-23 NOTE — ED Notes
COLLECTIVE?NOTIFICATION?11/23/2020 06:17?Donna Gross?MRN: [id number]    [facility] [name]:   BMW:[id number]  Account [id number]  Billing Account [id number]      Criteria Met      2 Visits in 30 Days    Security and Safety  No recent Security Events currently on file    ED Care Guidelines  There are currently no ED Care Guidelines for this patient. Please check your facility's medical records system.            E.D. Visit Count (12 mo.)  Facility Visits   [facility] 5   [facility] 1   [address] [facility] 3   Total 9   Note: Visits indicate total known visits.     Recent Emergency Department Visit Summary  Date Facility [facility] Type Diagnoses or Chief Complaint   Nov 23, 2020 [facility] [place] [facility]     Nov 19, 2020 [name] [place] [facility]  Chief Complaint: N/V/ABD PAIN/CHEST PAIN    Jul 29, 2020 [facility] Donna Gross. [place] [facility]  Chief Complaint: CHEST PAIN    Jul 28, 2020 [name] [place] [facility]  Chief Complaint: CHEST PAIN/BACK PAIN/BACK PAIN    May 11, 2020 [facility] Donna Gross. [place] [facility]  Chief Complaint: STOMACH PAIN; CHEST PAIN; BACK PAIN    Feb 18, 2020 [facility] Donna. [facility] Emergency      Major depressive disorder, single episode, unspecified      Other chest pain      Other long term (current) drug therapy      Gastro-esophageal reflux disease without esophagitis      Anxiety disorder, unspecified      Chronic obstructive pulmonary disease, unspecified      Unspecified abdominal pain      Jan 20, 2020 [facility] Donna. [facility] Emergency  Chief Complaint: chest pain, and stomach pain, back pain    Dec 31, 2019 [name] [place] [facility]  Chief Complaint: ABD PAIN    Dec 10, 2019 [facility] Gross Emergency      Major depressive disorder, single episode, unspecified      Nicotine dependence, other tobacco product, uncomplicated      Other long term (current) drug therapy      Acute gastritis without bleeding      Anemia, unspecified      Generalized abdominal pain      Chronic obstructive pulmonary disease, unspecified      Allergy status to analgesic agent          Recent Inpatient Visit Summary  Date Facility [facility] Type Diagnoses or Chief Complaint   Jan 25, 2020 [facility] XRay      1. Hypoglycemia, unspecified      2. Nausea with vomiting, unspecified      Jan 20, 2020 [facility] [place] Telemetry      Procedure and treatment not carried out because of patient's decision for other reasons      Long term (current) use of opiate analgesic      Major depressive disorder, single episode, unspecified      Acute pancreatitis without necrosis or infection, unspecified      Long term (current) use of aspirin      Other long term (current) drug therapy  Gastro-esophageal reflux disease without esophagitis      Family history of ischemic heart disease and other diseases of the circulatory system      Allergy status to analgesic agent      Chronic obstructive pulmonary disease, unspecified          Care Team  [facility] Phone Fax Service Dates   BUTLER, Josem Kaufmann, M.D. Family Medicine [phone] Current    [facility] Case Management [phone]  Current    PEREZ, [name], M.D. Family Medicine   Current      Collective Portal  This patient has registered at the [facility] Emergency Department   For more information visit: [email or web address]   PLEASE NOTE:     1.   Any care recommendations and other clinical information are provided as guidelines or for historical purposes only, and providers should exercise their own clinical judgment when providing care.    2.   You may only use this information for purposes of treatment, payment or health care operations activities, and subject to the limitations of applicable Collective Policies.    3.   You should consult directly with the organization that provided a care guideline or other clinical history with any questions about additional information or accuracy or completeness of information provided.    ? 2021 [place], [employer]. - [email or web address]

## 2020-11-23 NOTE — ED Notes
Report received from Karrie RN to assume care of Pt.

## 2020-11-23 NOTE — ED Notes
26F c/o dysphagia x5 days. Pt states she hasn't been eating well because she feels like vomiting everytime she swallows food. Denies CP, SOB.

## 2020-12-14 ENCOUNTER — Inpatient Hospital Stay
Admit: 2020-12-14 | Discharge: 2020-12-14 | Disposition: A | Discharge status: Home / Self Care | Payer: PRIVATE HEALTH INSURANCE | Source: Home / Self Care

## 2020-12-14 ENCOUNTER — Ambulatory Visit: Payer: PRIVATE HEALTH INSURANCE

## 2020-12-14 DIAGNOSIS — R112 Nausea with vomiting, unspecified: Secondary | ICD-10-CM

## 2020-12-14 DIAGNOSIS — R109 Unspecified abdominal pain: Secondary | ICD-10-CM

## 2020-12-14 LAB — Differential Automated: BASOPHIL PERCENT, AUTO: 0.6 (ref 1.30–3.40)

## 2020-12-14 LAB — Basic Metabolic Panel
CHLORIDE: 108 mmol/L — ABNORMAL HIGH (ref 96–106)
POTASSIUM: 5.1 mmol/L (ref 3.6–5.3)

## 2020-12-14 LAB — Amylase: AMYLASE: 97 U/L (ref 31–124)

## 2020-12-14 LAB — Prothrombin Time Panel: PROTHROMBIN TIME: 12.2 s (ref 11.5–14.4)

## 2020-12-14 LAB — Extra Light Green Top

## 2020-12-14 LAB — Hepatic Funct Panel: ALKALINE PHOSPHATASE: 100 U/L (ref 37–113)

## 2020-12-14 LAB — CBC: RED BLOOD CELL COUNT: 3.89 x10E6/uL — ABNORMAL LOW (ref 3.96–5.09)

## 2020-12-14 LAB — Troponin I: TROPONIN I: <0.04 ng/mL (ref <0.05)

## 2020-12-14 LAB — Lipase: LIPASE: 42 U/L (ref 13–69)

## 2020-12-14 MED ORDER — ONDANSETRON 4 MG PO TBDP
4 mg | ORAL_TABLET | Freq: Four times a day (QID) | ORAL | 0 refills | Status: AC | PRN
Start: 2020-12-14 — End: ?

## 2020-12-14 MED ORDER — OXYCODONE-ACETAMINOPHEN 5-325 MG PO TABS
1 | ORAL_TABLET | Freq: Three times a day (TID) | ORAL | 0 refills | Status: AC | PRN
Start: 2020-12-14 — End: ?

## 2020-12-14 MED ADMIN — HYDROMORPHONE HCL 2 MG/ML IJ SOLN: 1 mg | INTRAVENOUS | @ 13:00:00 | Stop: 2020-12-14 | NDC 00409336511

## 2020-12-14 MED ADMIN — SODIUM CHLORIDE 0.9 % IV BOLUS: 1000 mL | INTRAVENOUS | @ 13:00:00 | Stop: 2020-12-14 | NDC 00338004904

## 2020-12-14 MED ADMIN — OXYCODONE-ACETAMINOPHEN 5-325 MG PO TABS: 1 | ORAL | @ 15:00:00 | Stop: 2020-12-14 | NDC 00406051223

## 2020-12-14 MED ADMIN — IOHEXOL 350 MG/ML IV SOLN: 100 mL | INTRAVENOUS | @ 14:00:00 | Stop: 2020-12-14 | NDC 00407141491

## 2020-12-14 MED ADMIN — ONDANSETRON HCL 4 MG/2ML IJ SOLN: 4 mg | INTRAVENOUS | @ 12:00:00 | Stop: 2020-12-14 | NDC 60505613000

## 2020-12-14 NOTE — ED Notes
COLLECTIVE?NOTIFICATION?12/14/2020 03:43?Donna Gross?MRN: [id number]    [name]:   KZS:?[id number]  Account [id number]  Billing Account [id number]      Criteria Met      2 Visits in 30 Days    6 Visits in 180 Days    Security and Safety  No recent Security Events currently on file    ED Care Guidelines  There are currently no ED Care Guidelines for this patient. Please check your facility's medical records system.            E.D. Visit Count (12 mo.)  Facility Visits   [name] 4   [facility] [place] 2   [address] [facility] [facility] 4   Total 10   Note: Visits indicate total known visits.     Recent Emergency Department Visit Summary  Date Facility [facility] Type Diagnoses or Chief Complaint   Dec 14, 2020 [facility] [place] [facility]     Dec 06, 2020 [name] [place] [facility]  Chief Complaint: CHEST PAIN-ABD PAIN    Nov 23, 2020 [facility] [place] [facility]      1. Epigastric pain      2. Other dysphagia      3. Dysphagia      Nov 19, 2020 [name] [place] [facility]  Chief Complaint: N/V/ABD PAIN/CHEST PAIN    Jul 29, 2020 [name] [place] Emergency  Chief Complaint: CHEST PAIN    Jul 28, 2020 [name] [place] [facility]  Chief Complaint: CHEST PAIN/BACK PAIN/BACK PAIN    May 11, 2020 [name] [place] Emergency  Chief Complaint: STOMACH PAIN; CHEST PAIN; BACK PAIN    Feb 18, 2020 [facility] [place] [facility]      Major depressive disorder, single episode, unspecified      Other chest pain      Other long term (current) drug therapy      Gastro-esophageal reflux disease without esophagitis      Anxiety disorder, unspecified      Chronic obstructive pulmonary disease, unspecified      Unspecified abdominal pain      Jan 20, 2020 [facility] [place] [facility]  Chief Complaint: chest pain, and stomach pain, back pain Dec 31, 2019 [name] [place] [facility]  Chief Complaint: ABD PAIN        Recent Inpatient Visit Summary  Date Facility [facility] Type Diagnoses or Chief Complaint   Dec 06, 2020 [name] [place] [facility]  Chief Complaint: CHEST PAIN-ABD PAIN    Jan 25, 2020 [facility] XRay      1. Hypoglycemia, unspecified      2. Nausea with vomiting, unspecified      Jan 20, 2020 [facility] [place] Tele[facility]      Procedure and treatment not carried out because of patient's decision for other reasons      Long term (current) use of opiate analgesic      Major depressive disorder, single episode, unspecified      Acute pancreatitis without necrosis or infection, unspecified      Long term (current) use of aspirin      Other long term (current) drug therapy      Gastro-esophageal reflux  disease without esophagitis      Family history of ischemic heart disease and other diseases of the circulatory system      Allergy status to analgesic agent      Chronic obstructive pulmonary disease, unspecified          Care Team  [facility] Specialty Phone Fax Service Dates   BUTLER, Josem Kaufmann, M.D. Family Medicine   Current    [facility] Case Management [phone]  Current    PEREZ, [name], M.D. Family Medicine   Current      Collective Portal  This patient has registered at the [facility] Emergency Department   For more information visit: [email or web address]   PLEASE NOTE:     1.   Any care recommendations and other clinical information are provided as guidelines or for historical purposes only, and providers should exercise their own clinical judgment when providing care.    2.   You may only use this information for purposes of treatment, payment or health care operations activities, and subject to the limitations of applicable Collective Policies.    3.   You should consult directly with the organization that provided a care guideline or other clinical history with any questions about additional information or accuracy or completeness of information provided.    ? 2021 [place]. - [email or web address]

## 2020-12-14 NOTE — ED Provider Notes
[facility]  Emergency Department Service Report    Donna Gross   Emergency Provider Note:  12/14/2020 54 y.o. female presents with Nausea (pt with difficulty swallowing when eating, causing nausea vomitting and abd pain. ) and Abdominal Pain    Triage   Arrived on 12/14/2020 at 3:43 AM  Arrived by Walk-in [14]    History   Chief Complaint:   Nausea (pt with difficulty swallowing when eating, causing nausea vomitting and abd pain. ) and Abdominal Pain    History of Present Illness:   [name] is a 54 y.o. female who presents to the ED for evaluation of abdominal pain, R sided for 1 day, constant aching nonradiating no alleviating factors.  Assoc w/ vomiting nonbloody and nonbilious. No diarrhea. LBM yesterday. No fever/ cough/ CP /SOB.  No sick contacts/ travel.   Hx GERD/ fibromyalgia/ bipolar/ pancreatitis/ PUD.  Prior laparscopic vagotomy / antrectomy w/ gastrojejunustom and hiatal hernia repair.  Had lysis of adhesions performed by Dr. [name] 2020.  Chronic dysphagia.     Recent admission to [facility] from 12/17-12/19 for choledocholithiasis.  Had CT A/P performed there.  Covid/ flu negative on 12/18.  Had general surgery and GI consultations.    IMPRESSION:  Similar to findings on MRCP on 11/20/2020, the gallbladder is moderately distended with layering stones and sludge. There is moderate intrahepatic and extrahepatic biliary dilatation, increased compared to prior CT on 07/28/2020 but similar to MRI on 11/20/2020. The common bile duct is dilated, measuring up to 12 mm (it was measured as 14 mm on prior MRI 11/20/2020, however this may be due to difference in technique). There is diffuse prominence of the pancreatic duct to 4 mm without visualization of focal pancreatic lesion.    Past Medical History:  Past Medical History:   Diagnosis Date   ??? Anxiety    ??? Depression    ??? Emphysema, unspecified (HCC/RAF)    ??? Fibromyalgia    ??? Gastritis    ??? GERD (gastroesophageal reflux disease)    ??? Pancreatitis         Past Surgical History:  Past Surgical History:   Procedure Laterality Date   ??? ABDOMINAL SURGERY     ??? COLON SURGERY         Medications:   Previous Medications    ALUMINUM-MAGNESIUM HYDROXIDE-SIMETHICONE 400-400-40 MG/5 ML SUSPENSION    Take 10 mLs by mouth every four (4) hours as needed.    CLOBETASOL 0.05% CREAM    Apply topically three (3) times daily as needed (rash).    FLUOXETINE 40 MG CAPSULE    Take 40 mg by mouth daily.    FLUTICASONE-SALMETEROL (WIXELA INHUB) 250-50 MCG/DOSE DISKUS    Inhale 1 puff two (2) times daily as needed (SOB).    HYDROCODONE-ACETAMINOPHEN 5-325 MG TABLET    Take 1 tablet by mouth every six (6) hours as needed for Severe Pain (Pain Scale 7-10).    IPRATROPIUM-ALBUTEROL 20-100 MCG/ACT INHALER    Inhale 2 puffs three (3) times daily as needed (SOB).    LIDOCAINE VISCOUS 2% ORAL SOLUTION    Swish and spit 5 mLs every four (4) hours as needed (indigestions) .    METOCLOPRAMIDE 10 MG TABLET    Take 1 tablet (10 mg total) by mouth three (3) times daily before meals.    MULTIVITAMIN TABLET    Take 1 tablet by mouth daily.    NALOXONE HCL 4 MG/0.1ML LIQD    Call 911. Administer  a single spray intranasally into one nostril for opioid overdose. May repeat in 3 minutes if patient is not breathing.Marland Kitchen    ONDANSETRON ODT 4 MG DISINTEGRATING TABLET    Dissolve 1 tablet (4 mg total) on or under the tongue every six (6) hours as needed for Nausea or Vomiting.    ONDANSETRON ODT 4 MG DISINTEGRATING TABLET    Take 1 tablet (4 mg total) by mouth every six (6) hours as needed for Nausea or Vomiting.    OXYCODONE 5 MG TABLET    Take 1 tablet (5 mg total) by mouth every six (6) hours as needed for Moderate Pain (Pain Scale 4-6) or Severe Pain (Pain Scale 7-10). Max Daily Amount: 20 mg    PANTOPRAZOLE 40 MG DR TABLET    Take 40 mg by mouth daily .    PROMETHAZINE 6.25 MG/5 ML SOLUTION    Take 6.25 mg by mouth every six (6) hours as needed (cough).    QUETIAPINE 100 MG TABLET    Take 1 tablet (100 mg total) by mouth two (2) times daily.    SUCRALFATE 1 G/10 ML SUSPENSION    Take 10 mLs (1 g total) by mouth three (3) times daily with meals and at bedtime.    TRAZODONE 100 MG TABLET    Take 100 mg by mouth at bedtime.       Allergies:   Hydrocodone, Ibuprofen, and Morphine     Social History:   Social History     Socioeconomic History   ??? Marital status: Single     Spouse name: Not on file   ??? Number of children: Not on file   ??? Years of education: Not on file   ??? Highest education level: Not on file   Occupational History   ??? Not on file   Tobacco Use   ??? Smoking status: Current Every Day Smoker     Packs/day: 0.25     Types: Cigarettes   ??? Smokeless tobacco: Current User   ??? Tobacco comment: 30 years    Substance and Sexual Activity   ??? Alcohol use: No   ??? Drug use: No   ??? Sexual activity: Never   Other Topics Concern   ??? Not on file   Social History Narrative   ??? Not on file     Social Determinants of Health     Financial Resource Strain: Not on file   Physical Activity: Not on file   Stress: Not on file       Family History:  family history includes Diabetes in her brother and father; Heart disease in her mother.    Review of Systems:   Review of Systems  All systems reviewed, negative except as described above       Physical Exam   VITAL SIGNS: BP 110/72 (Patient Position: Sitting)  ~ Pulse 80  ~ Temp 36.6 ???C (97.8 ???F) (Oral)  ~ Resp 18  ~ Wt 66.7 kg (147 lb)  ~ SpO2 96%  ~ BMI 25.23 kg/m???   Pulse oximetry is interpreted by myself as normal   General: No acute distress. Alert and awake.  HEENT: Normocephalic. Atraumatic. Pupils equal  Neck:  Supple Trachea is midline              Chest: Lungs clear to auscultation without crackles/ wheezes  Cardiovascular: Regular rate and rhythm without murmurs, rubs or gallops.    Abdomen: Soft, nontender, prior scars of abdomen.    Skin:  Warm Pink and well-perfused extremities with brisk capillary refill.  Extremities: No cyanosis, clubbing or edema. Normal joint range of motion.  Neurologic: Alert &  No deficits noted.        RECORDS REVIEWED:   I have reviewed patient's available medical record, nursing notes and information.    Laboratory Results   Labs:  Labs Reviewed   BASIC METABOLIC PANEL - Abnormal; Notable for the following components:       Result Value    Chloride 108 (*)     Glucose 136 (*)     All other components within normal limits   CBC (PERFORMABLE) - Abnormal; Notable for the following components:    Red Blood Cell Count 3.89 (*)     Hemoglobin 10.9 (*)     MCH Concentration 30.7 (*)     Red Cell Distribution Width-SD 51.9 (*)     Platelet Count, Auto 403 (*)     Mean Platelet Volume 9.0 (*)     All other components within normal limits   PROTHROMBIN TIME PANEL - Normal   HEPATIC FUNCT PANEL - Normal   LIPASE - Normal   AMYLASE - Normal   TROPONIN I - Normal   RAINBOW DRAW TO LABORATORY    Narrative:     The following orders were created for panel order [place] Draw to Laboratory.  Procedure                               Abnormality         Status                     ---------                               -----------         ------                     Extra Burna Mortimer ZOX[[id number]]                            Final result                 Please view results for these tests on the individual orders.   CBC & AUTO DIFFERENTIAL    Narrative:     The following orders were created for panel order CBC & Plt & Diff.  Procedure                               Abnormality         Status                     ---------                               -----------         ------                     WJX[[id number]]                          Abnormal  Final result               Differential, Automated[[id number]]                          Final result                 Please view results for these tests on the individual orders.   EXTRA LIGHT GREEN TOP   DIFFERENTIAL, AUTOMATED (PERFORMABLE)   URINALYSIS W/REFLEX TO CULTURE    Narrative:     The following orders were created for panel order Urinalysis w/Reflex to Culture.  Procedure                               Abnormality         Status                     ---------                               -----------         ------                     UA,Dipstick[[id number]]                                                                 UA,Microscopic[[id number]]                                                                Please view results for these tests on the individual orders.   UA,DIPSTICK   UA,MICROSCOPIC         Imaging Results   ECG:  NSR 75 no ST elevations/ depressions, my interpretation    Rhythm Strip Interpretation  12/14/2020  5:51 AM  Normal sinus rhythm rate of 87 bpm.      Radiology:  No orders to display         Administered Medications     Medication Administration from 12/14/2020 0343 to 12/14/2020 0551       Date/Time Order Dose Route Action Action by Comments     12/14/2020 0429 HYDROmorphone 2 mg/mL inj 1 mg 1 mg IV Push Given Monico Hoar, RN      12/14/2020 0428 ondansetron 4 mg/2 mL inj 4 mg 4 mg Intravenous Given Monico Hoar, RN      12/14/2020 0429 sodium chloride 0.9% IV soln bolus 1,000 mL 1,000 mL Intravenous New Bag/ Syringe/ Cartridge Monico Hoar, RN             Procedures   Observation time:  Patient has no family history  that is relevant to this complaint.     Patient first seen at 31.     Observation began at 0400 and was necessary in order to determine improvement and to perform serial exams, labs, imaging, analgesics  Upon re-evaluation, observation  revealed that the patient should be discharged.  Medically cleared for discharge at ***      Total time of observation: 3+ hours.      ED Course and Medical Decision Making   Deshay Kirstein is a 54 y.o. female who presents with R sided abdominal pain and vomiting.    CBC was ordered to evaluate leukocytosis, leukopenia, presence of anemia, and quantitative evaluation of platelets.BMP was ordered to evaluate electrolyte abnormality such as hypo or hyper natremia, renal function, blood glucose level, and degree of acidosis or alkalosis. LFTs ordered to evaluate liver function, lipase, to check pancreatitis. Coags sent to evaluate coagulopathy.      Afebrile, No leukocytosis WBC 7.2   Nonspecific anemia Hb 10.  No significant hyperkalemia/ hypokalemia. No renal failure/ hepatic failure. Doubt ACS/ CVA.   No significant hypoglycemia/ hyperglycemia    IV dilaudid/ zofran/ IV fluids  No vomiting in ER.        Clinical Impression     1. Right sided abdominal pain    2. Nausea and vomiting, intractability of vomiting not specified, unspecified vomiting type          Disposition and Follow-up   Disposition:        Prescriptions     New Prescriptions    No medications on file             All scribe entries and documentation made by the scribe were entered at my direction.  I have reviewed this medical record and agree to the accuracy and completeness of the content entered by the scribe.  The documentation recorded by the scribe accurately reflects the service I personally performed and the decisions made by me.

## 2020-12-27 DIAGNOSIS — K8050 Calculus of bile duct without cholangitis or cholecystitis without obstruction: Secondary | ICD-10-CM

## 2020-12-27 IMAGING — US US PELVIS TRANSVAGINAL
1 series · 12 of 12 positions shown · non-contrast
Comparison: none

[Series 1: us pelvis transvaginal · 12 of 12 slices shown]
[im 1/12]
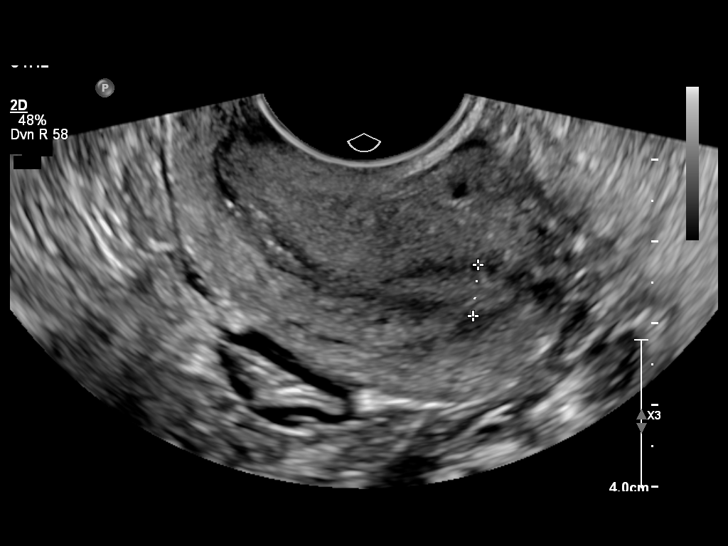
[im 2/12]
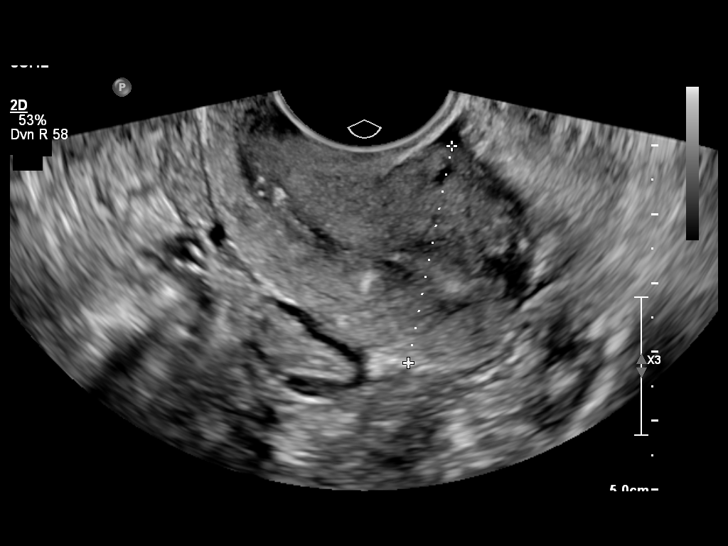
[im 3/12]
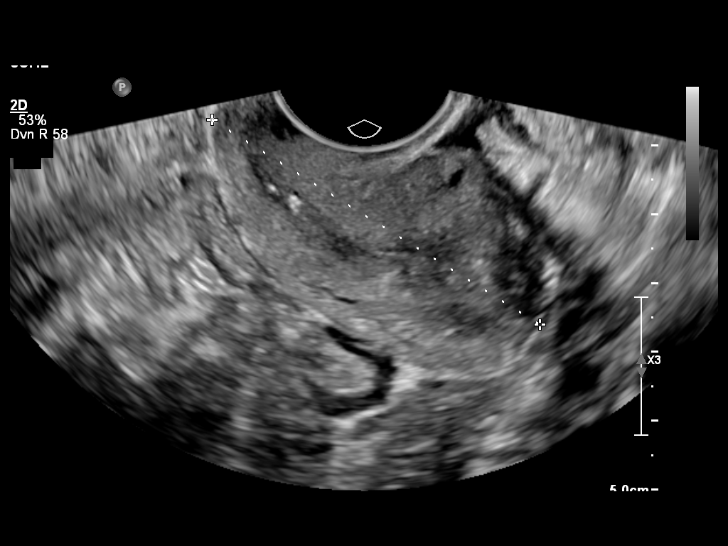
[im 4/12]
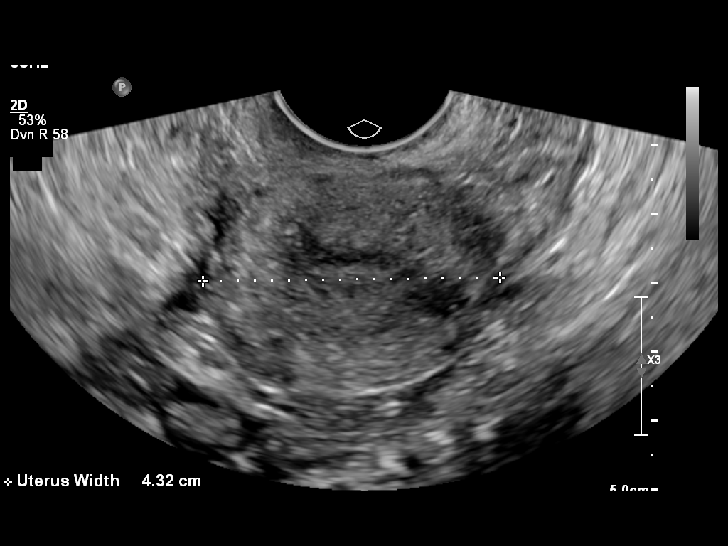
[im 5/12]
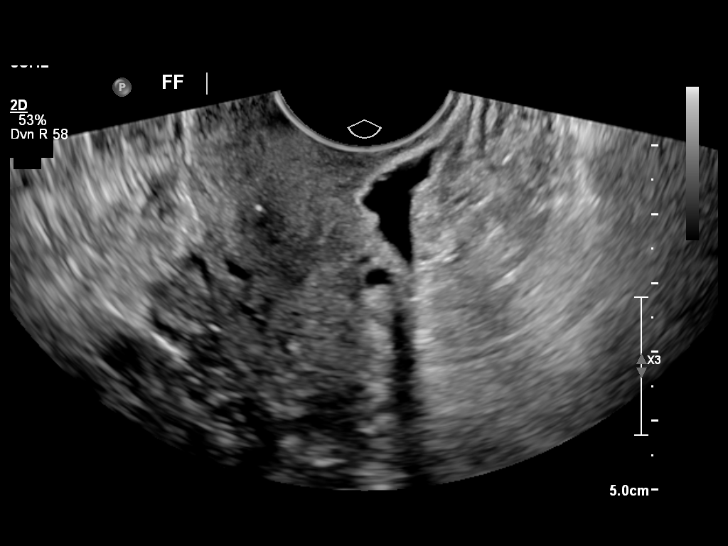
[im 6/12]
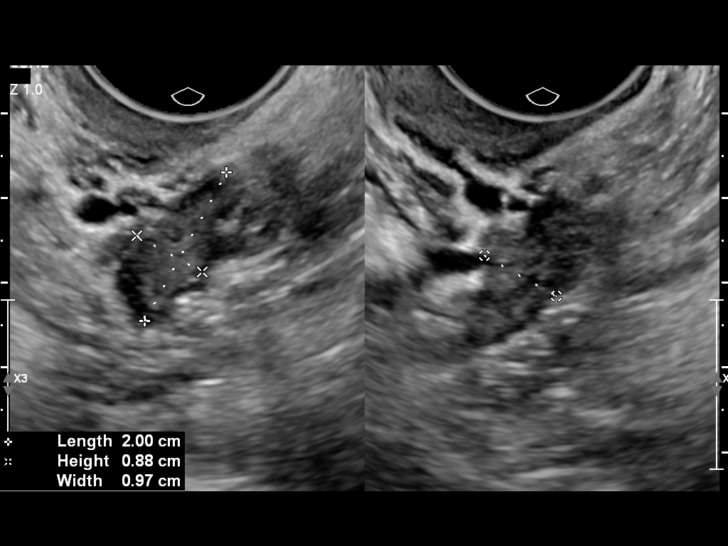
[im 7/12]
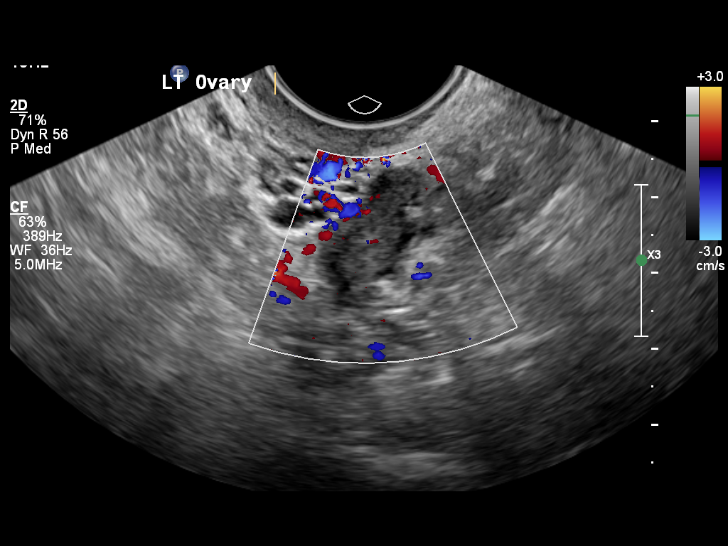
[im 8/12]
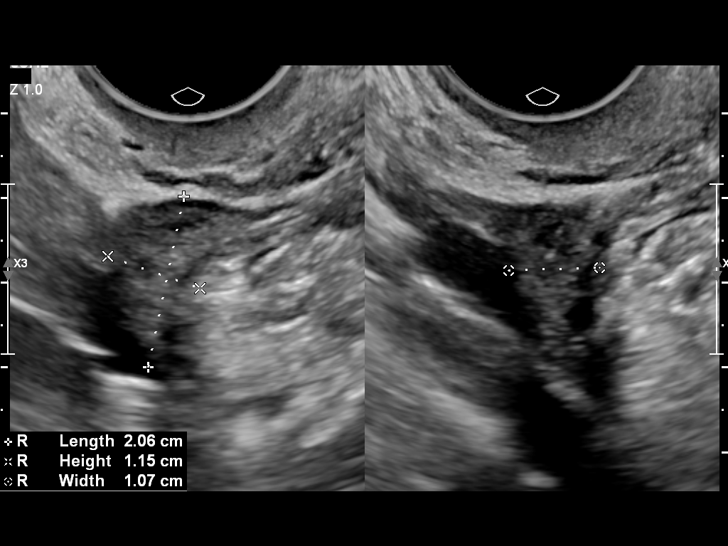
[im 9/12]
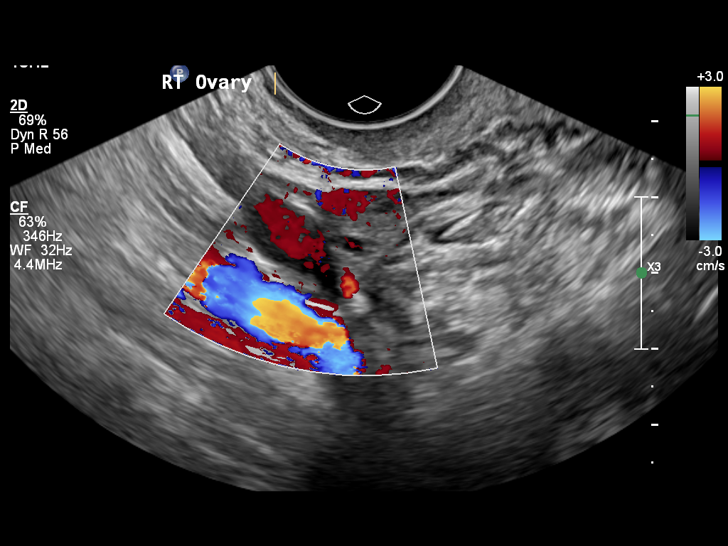
[im 10/12]
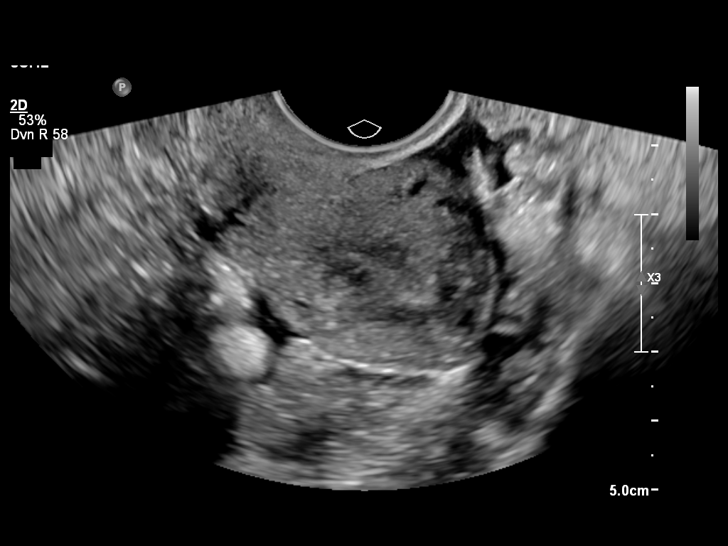
[im 11/12]
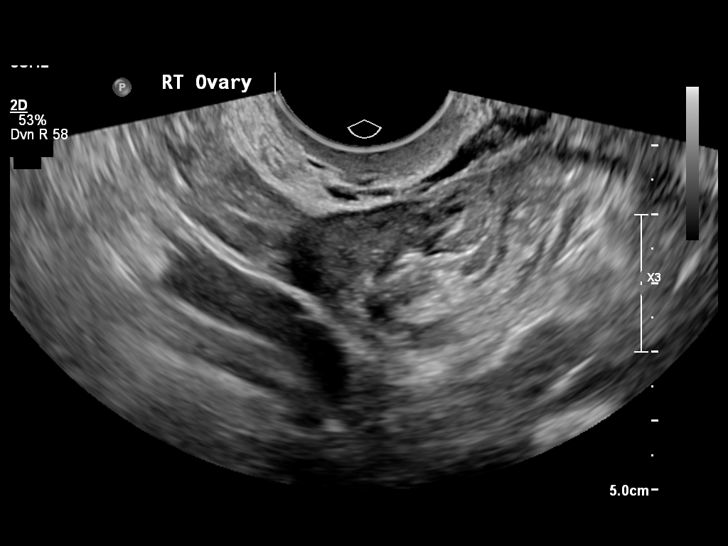
[im 12/12]
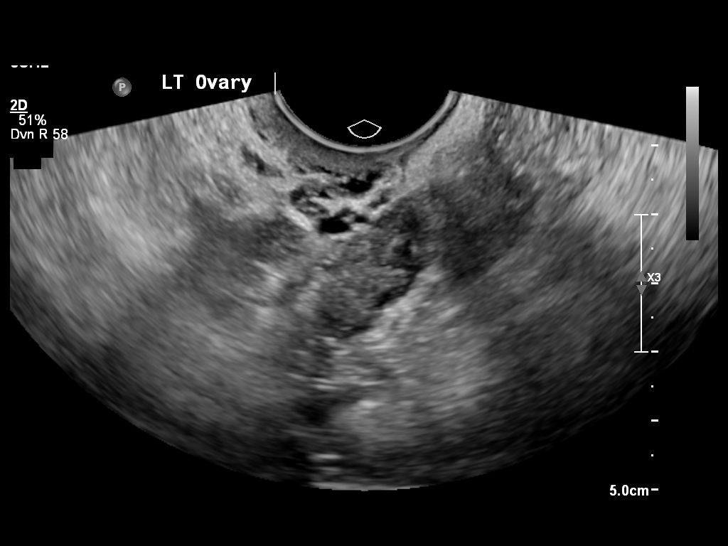

[12 of 12 positions shown; findings below may reference images not displayed]

This is a summary report. The complete report is available in the patient's medical record. If you cannot access the medical record, please contact the sending organization for a detailed fax or copy.
Uterus measures 5.6 cm long, 3.2 cm high, and 4.3 cm wide and is retroverted. Endometrium measures 0.63 cm, which is slightly thickened for a postmenopausal patient.  Right ovary measures 1.9 x 1 x 1.1 cm. Left ovary measures 2 x 0.97 x 0.88 cm. Blood flow noted in both ovaries. Transvaginal gyn ultrasound performed due to PMB.

## 2020-12-27 NOTE — ED Notes
COLLECTIVE?NOTIFICATION?12/27/2020 12:16?Alethia Berthold?MRN: [id number]    [name]:   LKG:?[id number]  Account [id number]  Billing Account [id number]      Criteria Met      2 Visits in 30 Days    6 Visits in 180 Days    Security and Safety  No recent Security Events currently on file    ED Care Guidelines  There are currently no ED Care Guidelines for this patient. Please check your facility's medical records system.            E.D. Visit Count (12 mo.)  Facility Visits   [name] 4   [facility] 3   [address] [facility] 4   Total 11   Note: Visits indicate total known visits.     Recent Emergency Department Visit Summary  Showing 10 most recent visits out of 11 in the past 12 months  Date Facility [facility] Type Diagnoses or Chief Complaint   Dec 27, 2020 [facility] [place] [facility]     Dec 14, 2020 [facility] [place] [facility]      1. Unspecified abdominal pain      2. Abdominal Pain      2. Nausea with vomiting, unspecified      3. Nausea      Dec 06, 2020 [name] [place] [facility]  Chief Complaint: CHEST PAIN-ABD PAIN    Nov 23, 2020 [facility] [place] [facility]      1. Epigastric pain      2. Other dysphagia      3. Dysphagia      Nov 19, 2020 [name] [place] [facility]  Chief Complaint: N/V/ABD PAIN/CHEST PAIN    Jul 29, 2020 [name] [place] [facility]  Chief Complaint: CHEST PAIN    Jul 28, 2020 [name] [place] [facility]  Chief Complaint: CHEST PAIN/BACK PAIN/BACK PAIN    May 11, 2020 [name] [place] [facility]  Chief Complaint: STOMACH PAIN; CHEST PAIN; BACK PAIN    Feb 18, 2020 [facility] [place] [place] [facility]      Major depressive disorder, single episode, unspecified      Other chest pain      Other long term (current) drug therapy      Gastro-esophageal reflux disease without esophagitis      Anxiety disorder, unspecified      Chronic obstructive pulmonary disease, unspecified      Unspecified abdominal pain      Jan 20, 2020 [facility] [place] [facility]  Chief Complaint: chest pain, and stomach pain, back pain        Recent Inpatient Visit Summary  Date Facility [facility] Type Diagnoses or Chief Complaint   Dec 06, 2020 [name]  Chief Complaint: CHEST PAIN-ABD PAIN    Jan 25, 2020 [facility] XRay      1. Hypoglycemia, unspecified      2. Nausea with vomiting, unspecified      Jan 20, 2020 [facility] [place] Telemetry      Procedure and treatment not carried out because of patient's decision for other reasons      Long term (current) use of opiate analgesic      Major depressive  disorder, single episode, unspecified      Acute pancreatitis without necrosis or infection, unspecified      Long term (current) use of aspirin      Other long term (current) drug therapy      Gastro-esophageal reflux disease without esophagitis      Family history of ischemic heart disease and other diseases of the circulatory system      Allergy status to analgesic agent      Chronic obstructive pulmonary disease, unspecified          Care Team  [facility] Phone Fax Service Dates   BUTLER, Josem Kaufmann, M.D. Family Medicine   Current    Sela Hilding, M.D. Family Medicine   Current      Collective Portal  This patient has registered at the [facility] Emergency Department   For more information visit: [email or web address]   PLEASE NOTE:     1.   Any care recommendations and other clinical information are provided as guidelines or for historical purposes only, and providers should exercise their own clinical judgment when providing care.    2.   You may only use this information for purposes of treatment, payment or health care operations activities, and subject to the limitations of applicable Collective Policies.    3.   You should consult directly with the organization that provided a care guideline or other clinical history with any questions about additional information or accuracy or completeness of information provided.    ? 2022 [place], [employer] - [email or web address]

## 2020-12-27 NOTE — ED Provider Notes
[facility]  Emergency Department Service Report    Donna Gross 55 y.o. female , presents with Abdominal Pain      Triage   Arrived on 12/27/2020 at 12:16 PM   Arrived by Car [5]    ED Triage Vitals   Temp Temp Source BP Heart Rate Resp SpO2 O2 Device Pain Score Weight   12/27/20 1225 12/27/20 1225 12/27/20 1225 12/27/20 1225 12/27/20 1225 12/27/20 1225 12/27/20 1225 12/27/20 1245 12/27/20 1245   36.6 ???C (97.9 ???F) Oral 125/80 61 16 97 % None (Room air) Ten 66.7 kg (147 lb)       Pre hospital care:       Allergies   Allergen Reactions   ??? Hydrocodone Other (See Comments)     ''burns a hole in stomach''  Tolerates hydromorphone   ??? Ibuprofen Other (See Comments)     GI discomfort    ''hurts my stomach''   ??? Morphine Itching     Tolerates hydromorphone       History   The history is provided by the patient. No language interpreter was used.   Abdominal Pain  The primary symptoms of the illness include abdominal pain and vomiting. The current episode started more than 2 days ago. The onset of the illness was sudden. The problem has been gradually worsening.   The abdominal pain began more than 2 days ago. The pain came on suddenly. The abdominal pain has been gradually worsening since its onset. The abdominal pain is generalized. The severity of the abdominal pain is 9/10. The abdominal pain is relieved by nothing. The abdominal pain is exacerbated by eating.   The emesis contains stomach contents.   The illness is associated with eating.          Patient is a 55 y.o. female with hx of gastritis, GERD, and pancreaitis who presents to the ED with complaint of constant, right sided abdominal pain with accompanied symptoms of swelling and vomiting for the past couple of weeks. Pt notices it primarily after eating and states it feels as if there something stuck in her throat. Pt has been seen twice already for same reasons. Pt states that she has gallstones and needs surgery. Was admitted twice for cholecystitis at [facility]  Past Medical History:   Diagnosis Date   ??? Anxiety    ??? Depression    ??? Emphysema, unspecified (HCC/RAF)    ??? Fibromyalgia    ??? Gastritis    ??? GERD (gastroesophageal reflux disease)    ??? Pancreatitis         Past Surgical History:   Procedure Laterality Date   ??? ABDOMINAL SURGERY     ??? COLON SURGERY          Past Family History   family history includes Diabetes in her brother and father; Heart disease in her mother.                 Past Social History   she reports that she has been smoking cigarettes. She has been smoking about 0.25 packs per day. She uses smokeless tobacco. She reports that she does not drink alcohol, does not use drugs, and does not engage in sexual activity.     Review of Systems   Constitutional: Positive for appetite change.   Gastrointestinal: Positive for abdominal pain and vomiting.   All other systems reviewed and are negative.      Physical Exam   Physical Exam  Vitals  and nursing note reviewed.   Constitutional:       General: She is not in acute distress.     Appearance: She is well-developed.   HENT:      Head: Normocephalic and atraumatic.   Eyes:      Conjunctiva/sclera: Conjunctivae normal.   Cardiovascular:      Rate and Rhythm: Normal rate and regular rhythm.   Pulmonary:      Effort: Pulmonary effort is normal. No respiratory distress.      Breath sounds: Normal breath sounds.   Abdominal:      Palpations: Abdomen is soft.      Tenderness: There is abdominal tenderness in the right upper quadrant. There is no guarding or rebound.   Musculoskeletal:         General: Normal range of motion.      Cervical back: Normal range of motion and neck supple.   Lymphadenopathy:      Cervical: No cervical adenopathy.   Skin:     General: Skin is warm and dry.   Neurological:      Mental Status: She is alert.      Sensory: No sensory deficit.      Comments: Moving all four extremities.   Psychiatric:         Behavior: Behavior normal.       ED Course          Laboratory Results     Labs Reviewed   BASIC METABOLIC PANEL - Abnormal; Notable for the following components:       Result Value    Creatinine 1.44 (*)     Urea Nitrogen 28 (*)     All other components within normal limits   CBC (PERFORMABLE) - Abnormal; Notable for the following components:    Red Cell Distribution Width-SD 50.3 (*)     Red Cell Distribution Width-CV 15.6 (*)     Platelet Count, Auto 425 (*)     Mean Platelet Volume 9.1 (*)     All other components within normal limits   DIFFERENTIAL, AUTOMATED (PERFORMABLE) - Abnormal; Notable for the following components:    Absolute Lymphocyte Count 3.97 (*)     All other components within normal limits   ALT (SGPT) - Normal   AST (SGOT) - Normal   BILIRUBIN,TOTAL - Normal   ALKALINE PHOSPHATASE - Normal   ALBUMIN - Normal   LIPASE - Normal   AMYLASE - Normal   APTT - Normal   PROTHROMBIN TIME PANEL - Normal   CBC & AUTO DIFFERENTIAL    Narrative:     The following orders were created for panel order CBC & Plt & Diff.  Procedure                               Abnormality         Status                     ---------                               -----------         ------                     XBJ[[id number]]  Abnormal            Final result               Differential, Automated[[id number]]      Abnormal            Final result                 Please view results for these tests on the individual orders.   RAINBOW DRAW TO LABORATORY    Narrative:     The following orders were created for panel order [facility], [place] Gallant).  Procedure                               Abnormality         Status                     ---------                               -----------         ------                     Extra Velda Shell ZOX[[id number]]                             Final result               Extra Burna Mortimer WJX[[id number]]                            Final result                 Please view results for these tests on the individual orders. EXTRA LIGHT BLUE TOP   EXTRA LIGHT GREEN TOP       Imaging Results     No orders to display       Administered Medications     Medication Administration from 12/27/2020 1216 to 12/28/2020 1435       Date/Time Order Dose Route Action Action by Comments     12/27/2020 1656 ondansetron 4 mg/2 mL inj 4 mg 4 mg Intravenous Given Juan Quam, [place]      12/27/2020 1656 HYDROmorphone 1 mg/mL inj 1 mg 1 mg IV Push Given Juan Quam, RN      12/27/2020 1908 sodium chloride 0.9% IV soln bolus 1,000 mL 0 mL Intravenous Stopped Audree Bane, RN      12/27/2020 1656 sodium chloride 0.9% IV soln bolus 1,000 mL 1,000 mL Intravenous New Bag/ Syringe/ Cartridge Fabio Asa, Isaac Bliss, RN           Procedures   Procedural Sedation  Procedures    MDM  ED Course:  Nursing note reviewed.  Previous medical records were obtained and reviewed by myself. They reveal a history of gastritis, GERD, and pancreaitis .    I visited the patient to obtain history and perform the physical exam.   IV access was secured.   Orders placed for CBC, BMP, and POCT.     Laboratory Results (as interpreted by me):      On reassessment, the patient remains grossly hemodynamically stable.      Medical Decision Making:  Donna Duster  Gross presents to the ED today with chief complaint of abdominal pan.  On exam, the patient exhibits RUQ abdominal tenderness.  Differential diagnosis includes .      Given the large differential diagnosis for Donna Gross, the decision making in this case is of high complexity.      After evaluating all of the data points in this case, the presentation of Donna Gross is NOT consistent with abdominal aortic aneurysm, aortic dissection, mesenteric ischemia, bowel perforation, small bowel obstruction, volvulus, acute coronary syndrome, appendicitis, cholecystitis, diverticulitis, pyelonephritis.    Patient presents with abdominal pain. Afebrile, normal pulse ox. Labs within normal limits. Urine shows no evidence of infection. Radiology studies shows no acute intra-abdominal pathology. Patient markedly improved in ER with IV pain medications, IV antiemetics, and IV hydration. Advise close follow up with primary physician.          FOR DISCHARGE Given the patient's physical exam, laboratory analysis, and imaging studies, my impression is the patient is experiencing an episode of .  Therefore, I feel comfortable discharging the patient home with close outpatient follow up.    Discharge Instructions:  I advised the patient to follow up with their PCP on an outpatient basis over the next 24-48 hours, and recommended prompt  return to the emergency department if they have additional concerns or note worsening symptoms.  The patient indicates understanding of these issues and agrees with the plan.  A copy of the ED  workup results was provided to the patient as well.    FOR ADMIT Given the patient's physical exam, laboratory analysis, and imaging studies, my impression is the patient is experiencing an episode of . Given the above findings and discussion, I strongly believe the patient will greatly benefit from an inpatient admission for further evaluation and treatment.  Clinical Impression     1. Biliary colic        Prescriptions     Discharge Medication List as of 12/27/2020  6:23 PM          Disposition and Follow-up   Disposition: Discharge [1]    No future appointments.    Follow up with:  Shelly Flatten., MD  [address], [address]  [place] [address]  [phone]            Return precautions are specified on After Visit Summary.    The documentation on this chart was performed by Pete Glatter, scribed for Elsie Stain., MD    12/27/2020 4:03 PM     All scribe entries and documentation made by the scribe were entered at my direction.  I have reviewed this medical record and agree to the accuracy and completeness of the content entered by the scribe.  The documentation recorded by the scribe accurately reflects the service I personally performed and the decisions made by me.    Odella Aquas, MD 2:36 PM 12/28/2020                 Elsie Stain., MD  12/28/20 [phone]

## 2020-12-28 ENCOUNTER — Inpatient Hospital Stay
Admit: 2020-12-28 | Discharge: 2020-12-28 | Disposition: A | Discharge status: Home / Self Care | Payer: PRIVATE HEALTH INSURANCE | Source: Home / Self Care

## 2020-12-28 LAB — Amylase: AMYLASE: 118 U/L (ref 31–124)

## 2020-12-28 LAB — Lipase: LIPASE: 37 U/L (ref 13–69)

## 2020-12-28 LAB — Differential Automated: IMMATURE GRANULOCYTES%: 0.1 (ref 1.80–6.90)

## 2020-12-28 LAB — Albumin: ALBUMIN: 4.6 g/dL (ref 3.9–5.0)

## 2020-12-28 LAB — CBC: MEAN CORPUSCULAR VOLUME: 87.8 fL (ref 79.3–98.6)

## 2020-12-28 LAB — Extra Light Blue Top

## 2020-12-28 LAB — Aspartate Aminotransferase: ASPARTATE AMINOTRANSFERASE: 27 U/L (ref 13–62)

## 2020-12-28 LAB — Alanine Aminotransferase: ALANINE AMINOTRANSFERASE: 15 U/L (ref 8–70)

## 2020-12-28 LAB — Basic Metabolic Panel
GFR ESTIMATE FOR AFRICAN AMERICAN: 47 mL/min/{1.73_m2} (ref 65–99)
TOTAL CO2: 20 mmol/L (ref 20–30)

## 2020-12-28 LAB — APTT: APTT: 33.6 s (ref 24.4–36.2)

## 2020-12-28 LAB — Extra Light Green Top

## 2020-12-28 LAB — Prothrombin Time Panel: INR: 1 s (ref 11.5–14.4)

## 2020-12-28 LAB — Bilirubin,Total: BILIRUBIN,TOTAL: <0.2 mg/dL (ref 0.1–1.2)

## 2020-12-28 LAB — Alkaline Phosphatase: ALKALINE PHOSPHATASE: 89 U/L (ref 37–113)

## 2020-12-28 MED ORDER — OXYCODONE HCL 5 MG PO TABS
5 mg | ORAL_TABLET | Freq: Four times a day (QID) | ORAL | 0 refills | Status: AC | PRN
Start: 2020-12-28 — End: ?

## 2020-12-28 MED ORDER — ONDANSETRON 4 MG PO TBDP
4 mg | ORAL_TABLET | Freq: Four times a day (QID) | ORAL | 0 refills | Status: AC | PRN
Start: 2020-12-28 — End: ?

## 2020-12-28 MED ADMIN — SODIUM CHLORIDE 0.9 % IV BOLUS: 1000 mL | INTRAVENOUS | @ 01:00:00 | Stop: 2020-12-28 | NDC 00338004904

## 2020-12-28 MED ADMIN — HYDROMORPHONE HCL 1 MG/ML IJ SOLN: 1 mg | INTRAVENOUS | @ 01:00:00 | Stop: 2020-12-28 | NDC 00409128303

## 2020-12-28 MED ADMIN — ONDANSETRON HCL 4 MG/2ML IJ SOLN: 4 mg | INTRAVENOUS | @ 01:00:00 | Stop: 2020-12-28 | NDC 60505613000

## 2020-12-31 ENCOUNTER — Telehealth: Payer: PRIVATE HEALTH INSURANCE

## 2020-12-31 NOTE — Telephone Encounter
Ccalled pt, informed her Berkley Harvey has been recieveed

## 2020-12-31 NOTE — Telephone Encounter
Call Back Request      Reason for call back: pt is asking if office has received her authorization for her appointment on 1/13?    Any Symptoms:  []  Yes  []  No       If yes, what symptoms are you experiencing:    o Duration of symptoms (how long):    o Have you taken medication for symptoms (OTC or Rx):      Patient or caller has been notified of the 24-48 hour turnaround time.

## 2021-01-01 DIAGNOSIS — K8050 Calculus of bile duct without cholangitis or cholecystitis without obstruction: Secondary | ICD-10-CM

## 2021-01-01 NOTE — Progress Notes
GENERAL SURGERY CONSULTATION     PATIENT: Donna Gross  MRN: [id number]  DOB: Aug 10, 1966  DATE OF SERVICE: 01/01/2021     PRIMARY CARE PROVIDER: [facility], [place]    Subjective:     History of Present Illness:  Donna Gross is a 55 y.o. female with a history of refractory ulcer disease s/p lap vagotomy and antrectomy with GJ and hiatal hernia repair with LOA, GERD, fibromyalgia, pancreatitis, who presents for right sided abdominal pain***.    ***    The patient is now here to discuss possible surgical intervention.      Past Medical History:   Past Medical History:   Diagnosis Date   ??? Anxiety    ??? Depression    ??? Emphysema, unspecified (HCC/RAF)    ??? Fibromyalgia    ??? Gastritis    ??? GERD (gastroesophageal reflux disease)    ??? Pancreatitis        Past Surgical History:   Past Surgical History:   Procedure Laterality Date   ??? ABDOMINAL SURGERY     ??? COLON SURGERY         Scheduled Meds:  Current Outpatient Medications   Medication Sig   ??? aluminum-magnesium hydroxide-simethicone 400-400-40 mg/5 mL suspension Take 10 mLs by mouth every four (4) hours as needed. (Patient taking differently: Take 10 mLs by mouth every four (4) hours as needed for Indigestion .)   ??? clobetasol 0.05% cream Apply topically three (3) times daily as needed (rash).   ??? FLUoxetine 40 mg capsule Take 40 mg by mouth daily.   ??? fluticasone-salmeterol (WIXELA INHUB) 250-50 mcg/dose diskus Inhale 1 puff two (2) times daily as needed (SOB).   ??? HYDROcodone-acetaminophen 5-325 mg tablet Take 1 tablet by mouth every six (6) hours as needed for Severe Pain (Pain Scale 7-10).   ??? ipratropium-albuterol 20-100 mcg/act inhaler Inhale 2 puffs three (3) times daily as needed (SOB).   ??? lidocaine viscous 2% oral solution Swish and spit 5 mLs every four (4) hours as needed (indigestions) .   ??? metoclopramide 10 mg tablet Take 1 tablet (10 mg total) by mouth three (3) times daily before meals.   ??? multivitamin tablet Take 1 tablet by mouth daily.   ??? Naloxone HCl 4 MG/0.1ML LIQD Call 911. Administer a single spray intranasally into one nostril for opioid overdose. May repeat in 3 minutes if patient is not breathing.Marland Kitchen   ??? ondansetron ODT 4 mg disintegrating tablet Dissolve 1 tablet (4 mg total) on or under the tongue every six (6) hours as needed for Nausea or Vomiting.   ??? ondansetron ODT 4 mg disintegrating tablet Take 1 tablet (4 mg total) by mouth every six (6) hours as needed for Nausea or Vomiting.   ??? ondansetron ODT 4 mg disintegrating tablet Take 1 tablet (4 mg total) by mouth every six (6) hours as needed for Nausea or Vomiting.   ??? oxyCODONE 5 mg tablet Take 1 tablet (5 mg total) by mouth every six (6) hours as needed for Moderate Pain (Pain Scale 4-6) or Severe Pain (Pain Scale 7-10). Max Daily Amount: 20 mg   ??? oxyCODONE-acetaminophen 5-325 mg tablet Take 1 tablet by mouth every eight (8) hours as needed for Severe Pain (Pain Scale 7-10). Max Daily Amount: 3 tablets   ??? pantoprazole 40 mg DR tablet Take 40 mg by mouth daily .   ??? promethazine 6.25 mg/5 mL solution Take 6.25 mg by mouth every six (6) hours as  needed (cough).   ??? quetiapine 100 mg tablet Take 1 tablet (100 mg total) by mouth two (2) times daily.   ??? sucralfate 1 g/10 mL suspension Take 10 mLs (1 g total) by mouth three (3) times daily with meals and at bedtime.   ??? traZODone 100 mg tablet Take 100 mg by mouth at bedtime.     No current facility-administered medications for this visit.       Allergies: Hydrocodone, Ibuprofen, and Morphine    Family History:   Family History   Problem Relation Age of Onset   ??? Heart disease Mother    ??? Diabetes Brother    ??? Diabetes Father    ??? Anesthesia problems Neg Hx    ??? Malignant hypertension Neg Hx    ??? Hypotension Neg Hx    ??? Malignant hyperthermia Neg Hx    ??? Pseudochol deficiency Neg Hx        Social History:   Social History     Socioeconomic History   ??? Marital status: Single     Spouse name: Not on file   ??? Number of children: Not on file   ??? Years of education: Not on file   ??? Highest education level: Not on file   Occupational History   ??? Not on file   Tobacco Use   ??? Smoking status: Current Every Day Smoker     Packs/day: 0.25     Types: Cigarettes   ??? Smokeless tobacco: Current User   ??? Tobacco comment: 30 years    Substance and Sexual Activity   ??? Alcohol use: No   ??? Drug use: No   ??? Sexual activity: Never   Other Topics Concern   ??? Not on file   Social History Narrative   ??? Not on file     Social Determinants of Health     Physical Activity: Not on file   Stress: Not on file   Financial Resource Strain: Not on file       Review of Systems: A 14 point review of systems was reviewed with the patient. In addition to the pertinent positives noted above. The remainder of the review of systems is negative.      Objective:     Physical Exam:  There were no vitals filed for this visit.  There is no height or weight on file to calculate BMI. There were no vitals filed for this visit.  There were no vitals filed for this visit.    General: The pateint is a well-appearing, well-nourished, in no acute distress.The patient is awake, alert, oriented, and answers questions appropriately.   Head: Normocephalic, atraumatic.   Eyes: Extraocular movements are intact. There is no scleral icterus.   Nose: The nose is midline. Mouth and lips: There are no intraoral or cutaneous lesions.   Neck: Trachea midline.  Heart: SR  Chest:  There are no adventitious breath sounds.  Abdomen: Soft and non-tender with no ventral hernia.   Extremities:  No clubbing, cyanosis, or edema.    Laboratory Review:  Lab Results   Component Value Date    WBC 8.97 12/27/2020    HGB 11.7 12/27/2020    HCT 36.7 12/27/2020    MCV 87.8 12/27/2020    PLT 425 (H) 12/27/2020     No results found for this or any previous visit (from the past 24 hour(s)).    Imaging Review: CT abd+pelvis w contrast    Result Date: 12/14/2020  IMPRESSION: No acute process in the abdomen or pelvis. Signed by: Karena Addison   12/14/2020 6:22 AM        Assessment/Plan:     Jeweliana Dudgeon is a 55 y.o. female with a history of refractory ulcer disease s/p lap vagotomy and antrectomy with GJ and hiatal hernia repair with LOA, GERD, fibromyalgia, pancreatitis, who presents for right sided abdominal pain***.    - ***  - ***    Author: Eda Paschal. Hargun Spurling, MD 01/01/2021 11:19 AM  General Surgery  [facility]    I spent 45 minutes face-to-face with the patient, care coordination, reviewing imaging, pertinent lab findings, and medical decision making. Over half in the discussion of the diagnosis and the discussed risks and benefits of surgery and alternative options.

## 2021-01-02 ENCOUNTER — Ambulatory Visit: Payer: PRIVATE HEALTH INSURANCE

## 2021-01-12 ENCOUNTER — Ambulatory Visit: Payer: PRIVATE HEALTH INSURANCE

## 2021-01-12 ENCOUNTER — Inpatient Hospital Stay
Admit: 2021-01-12 | Discharge: 2021-01-12 | Disposition: A | Discharge status: Home / Self Care | Payer: PRIVATE HEALTH INSURANCE | Source: Home / Self Care

## 2021-01-12 DIAGNOSIS — R109 Unspecified abdominal pain: Principal | ICD-10-CM

## 2021-01-12 LAB — Extra Light Green Top

## 2021-01-12 LAB — Basic Metabolic Panel
GLUCOSE: 100 mg/dL — ABNORMAL HIGH (ref 65–99)
POTASSIUM: 4.4 mmol/L (ref 3.6–5.3)

## 2021-01-12 LAB — Differential Automated: NEUTROPHIL PERCENT, AUTO: 63.3 (ref 0.20–0.80)

## 2021-01-12 LAB — Amylase: AMYLASE: 155 U/L — ABNORMAL HIGH (ref 31–124)

## 2021-01-12 LAB — Lipase: LIPASE: 27 U/L (ref 13–69)

## 2021-01-12 LAB — CBC: RED CELL DISTRIBUTION WIDTH-SD: 54.7 fL — ABNORMAL HIGH (ref 36.9–48.3)

## 2021-01-12 LAB — Hepatic Funct Panel: ALANINE AMINOTRANSFERASE: 16 U/L (ref 8–70)

## 2021-01-12 LAB — Prothrombin Time Panel: INR: 0.9 s (ref 11.5–14.4)

## 2021-01-12 MED ORDER — OXYCODONE-ACETAMINOPHEN 5-325 MG PO TABS
1 | ORAL_TABLET | ORAL | 0 refills | Status: AC | PRN
Start: 2021-01-12 — End: ?

## 2021-01-12 MED ORDER — ONDANSETRON 4 MG PO TBDP
4 mg | ORAL_TABLET | Freq: Four times a day (QID) | ORAL | 0 refills | Status: AC | PRN
Start: 2021-01-12 — End: ?

## 2021-01-12 MED ADMIN — ACETAMINOPHEN 325 MG PO TABS: 650 mg | ORAL | @ 17:00:00 | Stop: 2021-01-12

## 2021-01-12 MED ADMIN — FENTANYL CITRATE (PF) 100 MCG/2ML IJ SOLN: 50 ug | INTRAVENOUS | @ 17:00:00 | Stop: 2021-01-12 | NDC 00641602701

## 2021-01-12 MED ADMIN — SODIUM CHLORIDE 0.9 % IV BOLUS: 1000 mL | INTRAVENOUS | @ 16:00:00 | Stop: 2021-01-12 | NDC 00338004904

## 2021-01-12 MED ADMIN — IODIXANOL 320 MG/ML IV SOLN: 100 mL | INTRAVENOUS | @ 17:00:00 | Stop: 2021-01-12 | NDC 00407222317

## 2021-01-12 MED ADMIN — ONDANSETRON HCL 4 MG/2ML IJ SOLN: 4 mg | INTRAVENOUS | @ 16:00:00 | Stop: 2021-01-12 | NDC 60505613000

## 2021-01-12 NOTE — ED Notes
COLLECTIVE?NOTIFICATION?01/12/2021 07:02?Donna Gross?MRN: [id number]    [name]:   XBJ:?[id number]  Account [id number]  Billing Account [id number]      Criteria Met      6 Visits in 180 Days    2 Visits in 30 Days    Security and Safety  No recent Security Events currently on file    ED Care Guidelines  There are currently no ED Care Guidelines for this patient. Please check your facility's medical records system.            E.D. Visit Count (12 mo.)  Facility Visits   [facility] 4   [facility] 1   [facility] 4   [address] [place] 3   Total 12   Note: Visits indicate total known visits.     Recent Emergency Department Visit Summary  Showing 10 most recent visits out of 12 in the past 12 months  Date Facility [facility] Type Diagnoses or Chief Complaint   Jan 12, 2021 [facility] [place] [facility]     Jan 11, 2021 [facility] [place] [facility]  Chief Complaint: Seizure    Dec 27, 2020 [facility] [place] [facility]      1. Abdominal Pain      1. Calculus of bile duct without cholangitis or cholecystitis without obstruction      Dec 14, 2020 [facility] [place] [facility]      1. Unspecified abdominal pain      2. Abdominal Pain      2. Nausea with vomiting, unspecified      3. Nausea      Dec 06, 2020 [name] [place] [facility]  Chief Complaint: CHEST PAIN-ABD PAIN    Nov 23, 2020 [facility] [place] [facility]      1. Epigastric pain      2. Other dysphagia      3. Dysphagia      Nov 19, 2020 [name] [place] [facility]  Chief Complaint: N/V/ABD PAIN/CHEST PAIN    Jul 29, 2020 [name] [place] [facility]  Chief Complaint: CHEST PAIN    Jul 28, 2020 [name] [place] [facility]  Chief Complaint: CHEST PAIN/BACK PAIN/BACK PAIN    May 11, 2020 [name]  Chief Complaint: STOMACH PAIN; CHEST PAIN; BACK PAIN        Recent Inpatient Visit Summary  Date Facility [facility] Type Diagnoses or Chief Complaint   Dec 06, 2020 [name] [place] [facility]  Chief Complaint: CHEST PAIN-ABD PAIN    Jan 25, 2020 [facility] XRay      1. Hypoglycemia, unspecified      2. Nausea with vomiting, unspecified      Jan 20, 2020 [facility] [place] Tele[facility]      Procedure and treatment not carried out because of patient's decision for other reasons      Long term (current) use of opiate analgesic      Major depressive disorder, single episode, unspecified      Acute pancreatitis without necrosis or infection, unspecified      Long term (current) use of aspirin      Other long term (current) drug therapy  Gastro-esophageal reflux disease without esophagitis      Family history of ischemic heart disease and other diseases of the circulatory system      Allergy status to analgesic agent      Chronic obstructive pulmonary disease, unspecified          Care Team  [facility] Phone Fax Service Dates   BUTLER, Josem Kaufmann, M.D. Family Medicine   Current    Sela Hilding, M.D. Family Medicine   Current      Collective Portal  This patient has registered at the [facility] Emergency Department   For more information visit: [email or web address]   PLEASE NOTE:     1.   Any care recommendations and other clinical information are provided as guidelines or for historical purposes only, and providers should exercise their own clinical judgment when providing care.    2.   You may only use this information for purposes of treatment, payment or health care operations activities, and subject to the limitations of applicable Collect[employer] Policies.    3.   You should consult directly with the organization that provided a care guideline or other clinical history with any questions about additional information or accuracy or completeness of information provided.    ? 2022 [place], [employer]. - [email or web address]

## 2021-01-12 NOTE — ED Provider Notes
[facility]  Emergency Department Service Report    Donna Gross 55 y.o. female , presents with Abdominal Pain      Triage   Arrived on 01/12/2021 at 7:02 AM   Arrived by Walk-in [14]    ED Triage Vitals   Temp Temp Source BP Heart Rate Resp SpO2 O2 Device Pain Score Weight   01/12/21 0705 01/12/21 0705 01/12/21 0705 01/12/21 0705 01/12/21 0705 01/12/21 0705 01/12/21 0705 01/12/21 0707 01/12/21 0707   36.2 ???C (97.1 ???F) Oral 122/75 67 20 96 % None (Room air) Ten 67.6 kg (149 lb)       Pre hospital care:       Allergies   Allergen Reactions   ??? Hydrocodone Other (See Comments)     ''burns a hole in stomach''  Tolerates hydromorphone   ??? Ibuprofen Other (See Comments)     GI discomfort    ''hurts my stomach''   ??? Morphine Itching     Tolerates hydromorphone       History   Patient with h/o gallstones and hernia presenting with abdominal pain, nausea, worse after eating.    The history is provided by the patient.   Abdominal Pain  The primary symptoms of the illness include abdominal pain, nausea and vomiting. The primary symptoms of the illness do not include fever or diarrhea. The current episode started more than 2 days ago. The onset of the illness was gradual. The problem has not changed since onset.  The abdominal pain began more than 2 days ago. The pain came on gradually. The abdominal pain has been unchanged since its onset. The abdominal pain is generalized. The abdominal pain does not radiate. The severity of the abdominal pain is 3/10. The abdominal pain is relieved by nothing.   The patient has not had a change in bowel habit.            Past Medical History:   Diagnosis Date   ??? Anxiety    ??? Depression    ??? Emphysema, unspecified (HCC/RAF)    ??? Fibromyalgia    ??? Gastritis    ??? GERD (gastroesophageal reflux disease)    ??? Pancreatitis         Past Surgical History:   Procedure Laterality Date   ??? ABDOMINAL SURGERY     ??? COLON SURGERY          Past Family History   family history includes Diabetes in her brother and father; Heart disease in her mother.                 Past Social History   she reports that she has been smoking cigarettes. She has been smoking about 0.25 packs per day. She uses smokeless tobacco. She reports that she does not drink alcohol, does not use drugs, and does not engage in sexual activity.     Review of Systems   Constitutional: Negative for fever.   Gastrointestinal: Positive for abdominal pain, nausea and vomiting. Negative for diarrhea.   All other systems reviewed and are negative.      Physical Exam   Physical Exam  Vitals and nursing note reviewed.   Constitutional:       Appearance: Normal appearance.   HENT:      Head: Normocephalic.   Eyes:      Extraocular Movements: Extraocular movements intact.      Conjunctiva/sclera: Conjunctivae normal.   Cardiovascular:      Rate and Rhythm:  Normal rate and regular rhythm.   Pulmonary:      Effort: Pulmonary effort is normal.      Breath sounds: Normal breath sounds.   Abdominal:      Palpations: Abdomen is soft.      Tenderness: There is abdominal tenderness in the right upper quadrant and right lower quadrant.      Comments: Multiple scars, RUQ and RLQ tenderness   Musculoskeletal:         General: Normal range of motion.   Skin:     General: Skin is warm.   Neurological:      General: No focal deficit present.      Mental Status: She is alert.   Psychiatric:         Mood and Affect: Mood normal.         Behavior: Behavior normal.         ED Course          Laboratory Results     Labs Reviewed   BASIC METABOLIC PANEL - Abnormal; Notable for the following components:       Result Value    Chloride 108 (*)     Glucose 100 (*)     All other components within normal limits   AMYLASE - Abnormal; Notable for the following components:    Amylase 155 (*)     All other components within normal limits   CBC (PERFORMABLE) - Abnormal; Notable for the following components:    Red Blood Cell Count 3.86 (*)     Hemoglobin 10.6 (*)     MCH Concentration 29.5 (*)     Red Cell Distribution Width-SD 54.7 (*)     Red Cell Distribution Width-CV 15.9 (*)     Mean Platelet Volume 8.7 (*)     All other components within normal limits   PROTHROMBIN TIME PANEL - Normal   HEPATIC FUNCT PANEL - Normal   LIPASE - Normal   RAINBOW DRAW TO LABORATORY    Narrative:     The following orders were created for panel order [facility] Draw to Laboratory.  Procedure                               Abnormality         Status                     ---------                               -----------         ------                     Extra Burna Mortimer ZOX[[id number]]                            Final result                 Please view results for these tests on the individual orders.   CBC & AUTO DIFFERENTIAL    Narrative:     The following orders were created for panel order CBC & Plt & Diff.  Procedure  Abnormality         Status                     ---------                               -----------         ------                     ZOX[[id number]]                          Abnormal            Final result               Differential, Automated[[id number]]                          Final result                 Please view results for these tests on the individual orders.   EXTRA LIGHT GREEN TOP   DIFFERENTIAL, AUTOMATED (PERFORMABLE)   POCT URINALYSIS DIPSTICK       Imaging Results     CT abd+pelvis w contrast   Final Result by Cephus Shelling., MD (01/23 8119)   IMPRESSION:         1. Postsurgical changes of gastrojejunostomy without evidence for bowel obstruction.      2. Stable appearance of moderate intrahepatic biliary ductal dilatation and dilatation of the common bile duct since 2016.               Signed by: Burman Nieves   01/12/2021 9:38 AM          Administered Medications     Medication Administration from 01/12/2021 1478 to 01/12/2021 1302       Date/Time Order Dose Route Action Action by Comments     01/12/2021 0809 ondansetron 4 mg/2 mL inj 4 mg 4 mg Intravenous Given Tempie Hoist, RN      01/12/2021 0950 sodium chloride 0.9% IV soln bolus 1,000 mL 0 mL Intravenous Stopped Tempie Hoist, RN      01/12/2021 2956 sodium chloride 0.9% IV soln bolus 1,000 mL 1,000 mL Intravenous New Bag/ Syringe/ Cartridge Tempie Hoist, RN      01/12/2021 2130 acetaminophen tab 650 mg 650 mg Oral Not Given Jodi Mourning I, RN      01/12/2021 0845 fentaNYL (PF) 100 mcg/2 mL inj 50 mcg 50 mcg IV Push Given Tempie Hoist, RN      01/12/2021 0907 iodixanol (Visipaque) 320 mg/mL inj 100 mL 100 mL Intravenous Given [facility]           Procedures   Procedural Sedation  Procedures    MDM    Patient progress: improved    Patient presenting with abdominal pain. No e/o pancreatitis, hepatitis, cholecystitis on metabolic panel. Biliary dilatation stable on CT. No e/o UTI on urinalysis. No e/o appendicitis, diverticulitis, perforation or obstruction on CT. Patient improved with IV medications and hydration. Patient tol po's and pain is controled. Outpatient follow up with pmd.      I have reviewed the patient's vital signs and nursing notes and any labs or imaging studies that were performed in the ER. I had a detailed discussion with the patient regarding the  historical points, exam findings and any diagnostic results supporting the discharge diagnosis. I also discussed the need for outpatient follow up and the need to return to the ED if symptoms worsen or if there are any questions or concerns that arise at home.  The patient is well appearing, tolerating PO's and will follow up with PMD.    Consults: N/A    Clinical Impression     1. Abdominal pain, acute        Prescriptions     Discharge Medication List as of 01/12/2021 10:03 AM          Disposition and Follow-up   Disposition: Discharge [1]    Future Appointments   Date Time Provider Department Center   01/16/2021  9:45 AM Shelly Flatten., MD SUR GEN [facility] SURGERY       Follow up with:  Shelly Flatten., MD  [address]  [place] [address]  [phone]    In 1 week  for further evaluation and treatment      Return precautions are specified on After Visit Summary.                 Tera Mater., MD  01/12/21 1302

## 2021-01-14 NOTE — Progress Notes
GENERAL SURGERY CONSULTATION     PATIENT: Donna Gross  MRN: [id number]  DOB: Nov 12, 1966  DATE OF SERVICE: 01/13/2021     PRIMARY CARE PROVIDER: [facility]    Subjective:     History of Present Illness:  Donna Gross is a 55 y.o. female with a history of refractory ulcer disease s/p lap vagotomy and antrectomy with GJ and hiatal hernia repair with LOA, GERD, fibromyalgia, pancreatitis, who presents for right sided abdominal pain***.    ***    The patient is now here to discuss possible surgical intervention.      Past Medical History:   Past Medical History:   Diagnosis Date   ??? Anxiety    ??? Depression    ??? Emphysema, unspecified (HCC/RAF)    ??? Fibromyalgia    ??? Gastritis    ??? GERD (gastroesophageal reflux disease)    ??? Pancreatitis        Past Surgical History:   Past Surgical History:   Procedure Laterality Date   ??? ABDOMINAL SURGERY     ??? COLON SURGERY         Scheduled Meds:  Current Outpatient Medications   Medication Sig   ??? aluminum-magnesium hydroxide-simethicone 400-400-40 mg/5 mL suspension Take 10 mLs by mouth every four (4) hours as needed. (Patient taking differently: Take 10 mLs by mouth every four (4) hours as needed for Indigestion .)   ??? clobetasol 0.05% cream Apply topically three (3) times daily as needed (rash).   ??? FLUoxetine 40 mg capsule Take 40 mg by mouth daily.   ??? fluticasone-salmeterol (WIXELA INHUB) 250-50 mcg/dose diskus Inhale 1 puff two (2) times daily as needed (SOB).   ??? HYDROcodone-acetaminophen 5-325 mg tablet Take 1 tablet by mouth every six (6) hours as needed for Severe Pain (Pain Scale 7-10).   ??? ipratropium-albuterol 20-100 mcg/act inhaler Inhale 2 puffs three (3) times daily as needed (SOB).   ??? lidocaine viscous 2% oral solution Swish and spit 5 mLs every four (4) hours as needed (indigestions) .   ??? metoclopramide 10 mg tablet Take 1 tablet (10 mg total) by mouth three (3) times daily before meals.   ??? multivitamin tablet Take 1 tablet by mouth daily.   ??? Naloxone HCl 4 MG/0.1ML LIQD Call 911. Administer a single spray intranasally into one nostril for opioid overdose. May repeat in 3 minutes if patient is not breathing.Marland Kitchen   ??? ondansetron ODT 4 mg disintegrating tablet Take 1 tablet (4 mg total) by mouth every six (6) hours as needed for Nausea or Vomiting.   ??? oxyCODONE 5 mg tablet Take 1 tablet (5 mg total) by mouth every six (6) hours as needed for Moderate Pain (Pain Scale 4-6) or Severe Pain (Pain Scale 7-10). Max Daily Amount: 20 mg   ??? oxyCODONE-acetaminophen 5-325 mg tablet Take 1 tablet by mouth every four (4) hours as needed. Max Daily Amount: 6 tablets   ??? pantoprazole 40 mg DR tablet Take 40 mg by mouth daily .   ??? promethazine 6.25 mg/5 mL solution Take 6.25 mg by mouth every six (6) hours as needed (cough).   ??? quetiapine 100 mg tablet Take 1 tablet (100 mg total) by mouth two (2) times daily.   ??? sucralfate 1 g/10 mL suspension Take 10 mLs (1 g total) by mouth three (3) times daily with meals and at bedtime.   ??? traZODone 100 mg tablet Take 100 mg by mouth at bedtime.  No current facility-administered medications for this visit.       Allergies: Hydrocodone, Ibuprofen, and Morphine    Family History:   Family History   Problem Relation Age of Onset   ??? Heart disease Mother    ??? Diabetes Brother    ??? Diabetes Father    ??? Anesthesia problems Neg Hx    ??? Malignant hypertension Neg Hx    ??? Hypotension Neg Hx    ??? Malignant hyperthermia Neg Hx    ??? Pseudochol deficiency Neg Hx        Review of Systems: A 14 point review of systems was reviewed with the patient. In addition to the pertinent positives noted above. The remainder of the review of systems is negative.      Objective:     Physical Exam:  There were no vitals filed for this visit.  There is no height or weight on file to calculate BMI. There were no vitals filed for this visit.  There were no vitals filed for this visit.    General: NAD  Head: Normocephalic, atraumatic.   Eyes: EOMI  Nose: Wearing mask  Heart: Regular rate  Chest:  Normal WOB  Abdomen: Soft, ***.   Extremities:  No clubbing, cyanosis, or edema.    Laboratory Review:  Lab Results   Component Value Date    WBC 8.02 01/12/2021    HGB 10.6 (L) 01/12/2021    HCT 35.9 01/12/2021    MCV 93.0 01/12/2021    PLT 363 01/12/2021     No results found for this or any previous visit (from the past 24 hour(s)).    Imaging Review: CT abd+pelvis w contrast    Result Date: 01/12/2021  IMPRESSION: 1. Postsurgical changes of gastrojejunostomy without evidence for bowel obstruction. 2. Stable appearance of moderate intrahepatic biliary ductal dilatation and dilatation of the common bile duct since 2016. Signed by: Burman Nieves   01/12/2021 9:38 AM        Assessment/Plan:     Donna Gross is a 55 y.o. female with a history of refractory ulcer disease s/p lap vagotomy and antrectomy with GJ and hiatal hernia repair with LOA, GERD, fibromyalgia, pancreatitis, who presents for right sided abdominal pain***.    - ***  - ***    Author: Eda Paschal. Eriko Economos, MD 01/13/2021 6:14 PM  General Surgery  [facility]

## 2021-01-16 ENCOUNTER — Ambulatory Visit: Payer: PRIVATE HEALTH INSURANCE

## 2021-01-16 DIAGNOSIS — R1011 Right upper quadrant pain: Secondary | ICD-10-CM

## 2021-01-16 IMAGING — MR MRI BRAIN W WO CONTRAST
15 series · 46 of 48 positions shown · IV contrast (agent unspecified)
Comparison: none

EXAM: MRI BRAIN WITHOUT AND WITH CONTRAST
CLINICAL HISTORY: Disorientation, unspecified.
PROCEDURE: Multiplanar and multisequence MRI images of the brain were obtained prior to and after the administration of intravenous contrast material. Informed consent is obtained.

[Series 1: survey · sagittal · 1.6mm · 1.62mm/px · 10 of 128 slices shown]
[im 1/128]
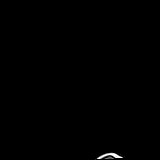
[im 12/128]
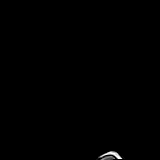
[im 24/128]
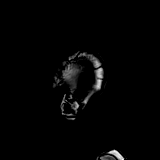
[im 35/128]
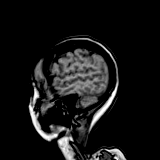
[im 47/128]
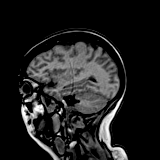
[im 58/128]
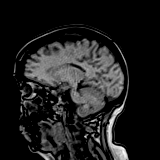
[im 70/128]
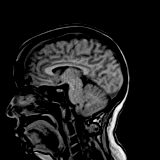
[im 93/128]
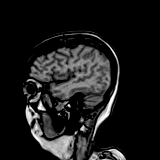
[im 104/128]
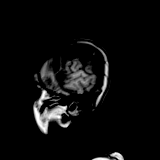
[im 128/128]
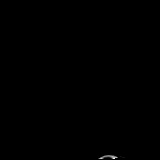

[Series 2: survey_mpr_sag · sagittal · 1.6mm · 1.60mm/px · 1 of 5 slices shown]
[im 1/5]
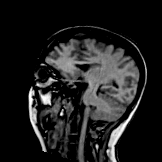

[Series 3: survey_mpr_cor · coronal · 1.6mm · 1.60mm/px · 1 of 3 slices shown]
[im 1/3]
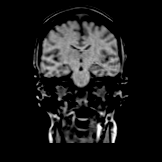

[Series 4: survey_mpr_tra · axial · 1.6mm · 1.60mm/px · 1 of 3 slices shown]
[im 1/3]
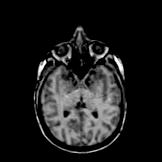

[Series 5: T1 · sagittal · 4.0mm · 0.72mm/px · 3 of 27 slices shown (1 of 2)]
[im 1/27]
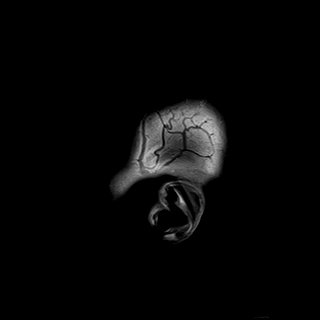
[im 14/27]
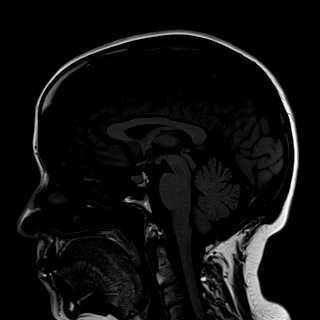
[im 27/27]
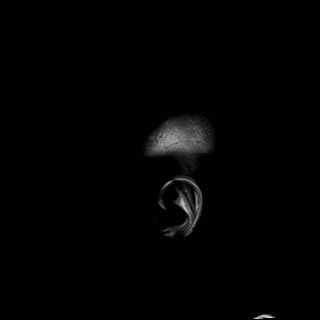

[Series 6: ep2d_diff_(id)_trace_p2_tracew · axial · 4.0mm · 0.60mm/px · z∈[-87,+60]mm · 3 of 32 slices shown]
[im 1/32]
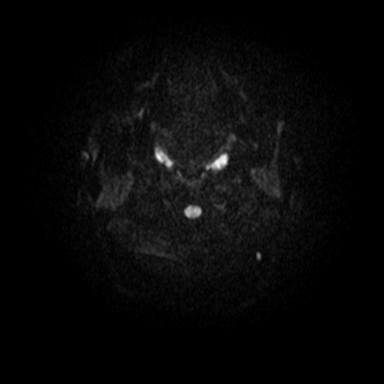
[im 16/32]
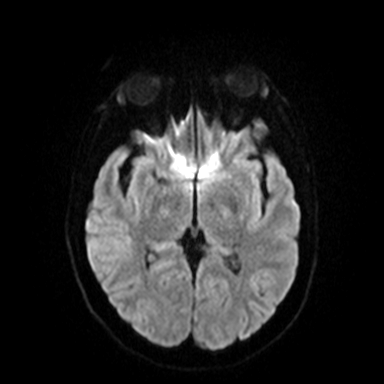
[im 32/32]
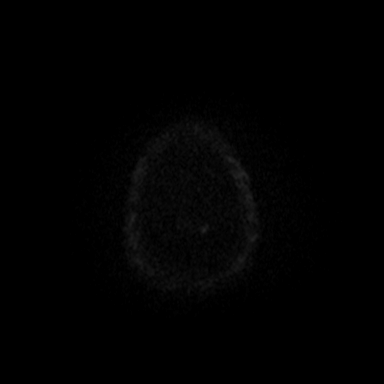

[Series 7: ep2d_diff_(id)_trace_p2_adc · axial · 4.0mm · 0.60mm/px · z∈[-87,+60]mm · 3 of 32 slices shown]
[im 1/32]
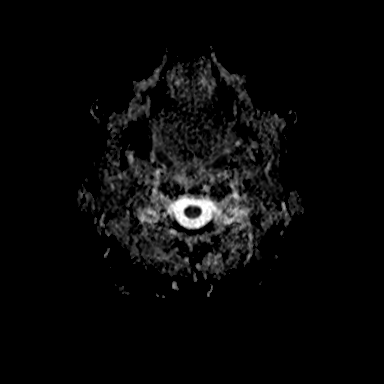
[im 16/32]
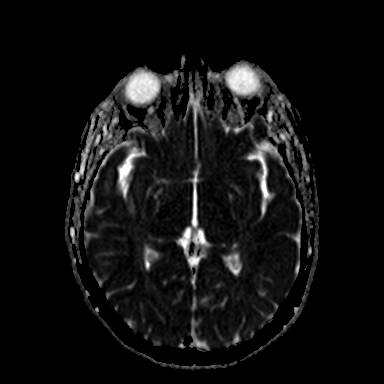
[im 32/32]
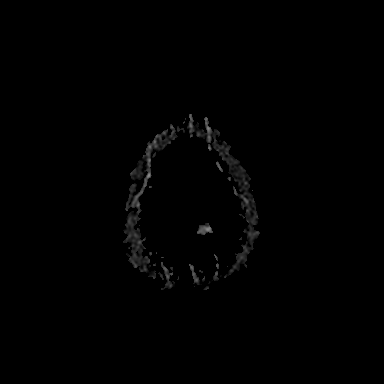

[Series 8: ep2d_diff_(id)_trace_p2_exp · axial · 4.0mm · 0.60mm/px · z∈[-87,+60]mm · 3 of 32 slices shown]
[im 1/32]
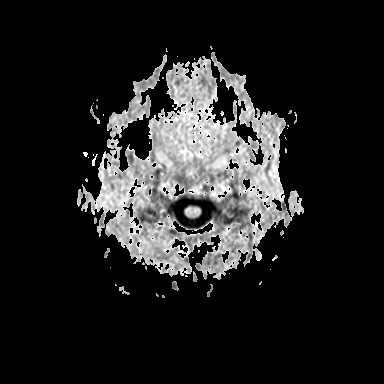
[im 16/32]
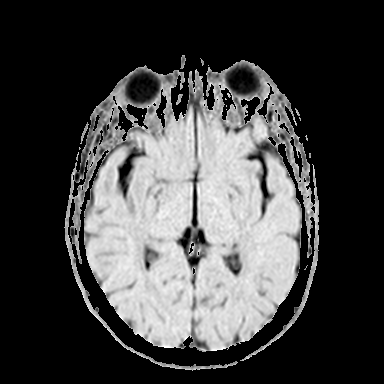
[im 32/32]
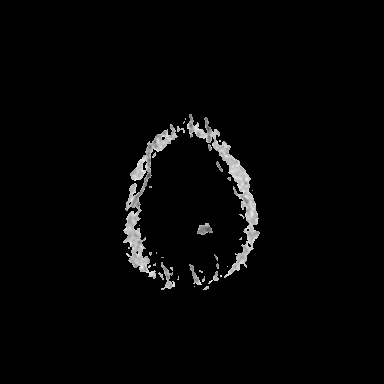

[Series 9: T2 · axial · 4.0mm · 0.51mm/px · z∈[-87,+61]mm · 3 of 32 slices shown]
[im 1/32]
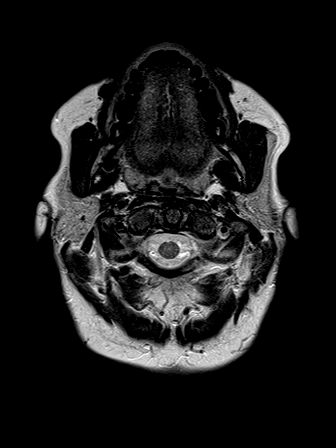
[im 16/32]
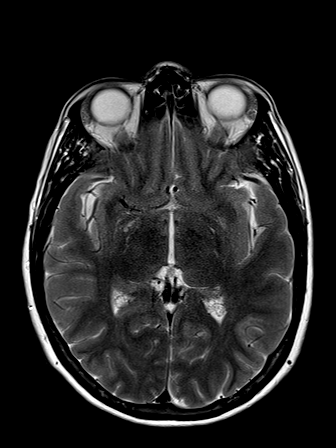
[im 32/32]
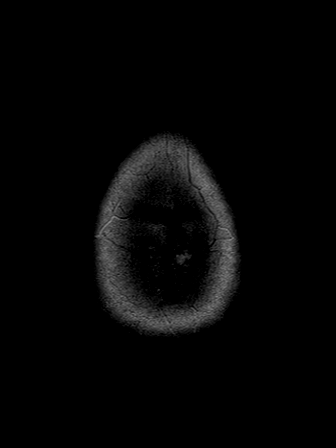

[Series 10: FLAIR · axial · 4.0mm · 0.72mm/px · z∈[-87,+60]mm · 3 of 32 slices shown]
[im 1/32]
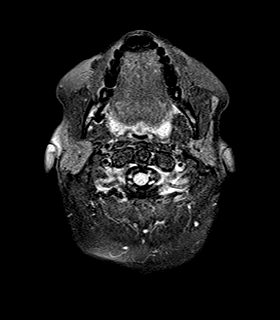
[im 16/32]
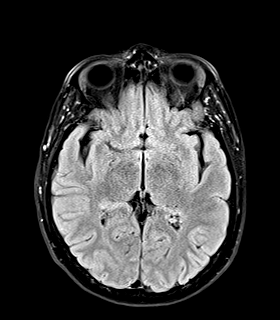
[im 32/32]
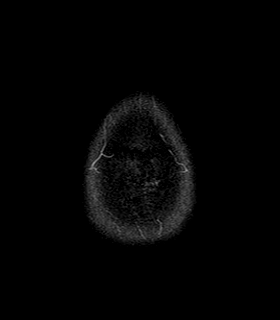

[Series 11: T1 · axial · 4.0mm · 0.72mm/px · z∈[-87,+60]mm · 3 of 32 slices shown (2 of 2)]
[im 1/32]
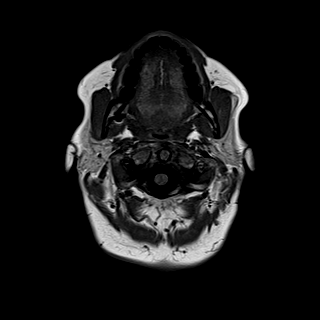
[im 16/32]
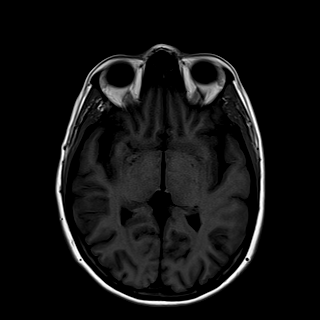
[im 32/32]
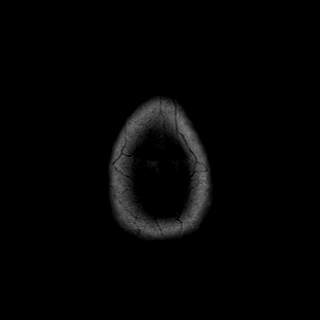

[Series 12: GRE · axial · 4.0mm · 0.45mm/px · z∈[-87,+60]mm · 3 of 32 slices shown]
[im 1/32]
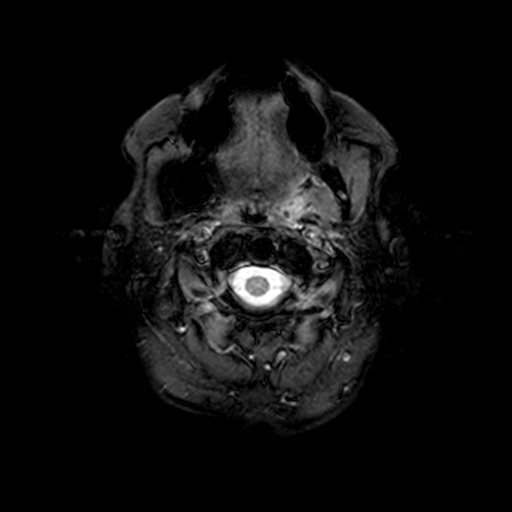
[im 16/32]
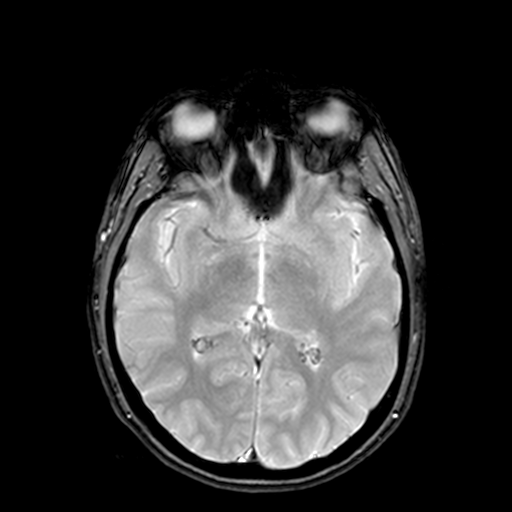
[im 32/32]
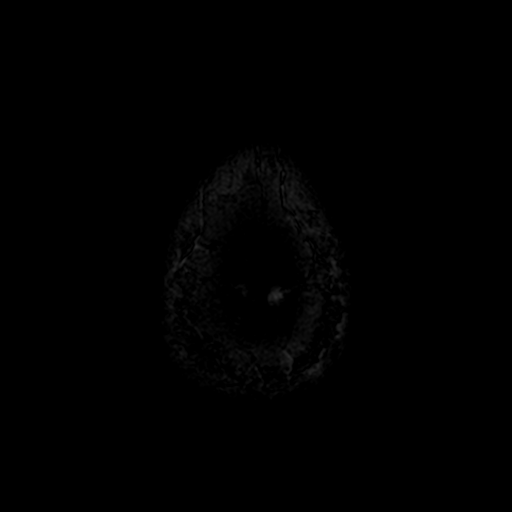

[Series 13: T1 post-contrast · axial · 4.0mm · 0.72mm/px · z∈[-87,+60]mm · 3 of 32 slices shown (1 of 3)]
[im 1/32]
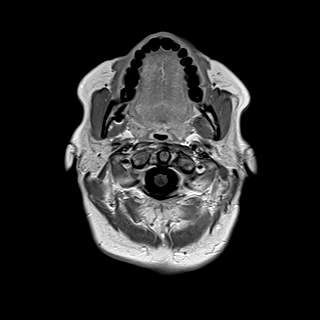
[im 16/32]
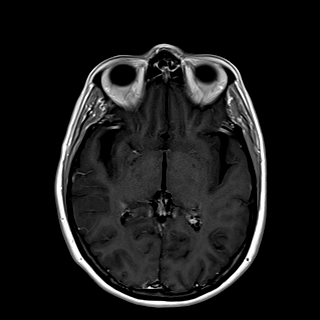
[im 32/32]
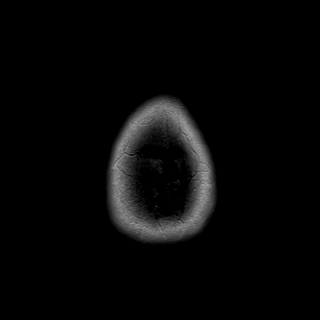

[Series 14: T1 post-contrast · coronal · 4.0mm · 0.72mm/px · 3 of 32 slices shown (2 of 3)]
[im 1/32]
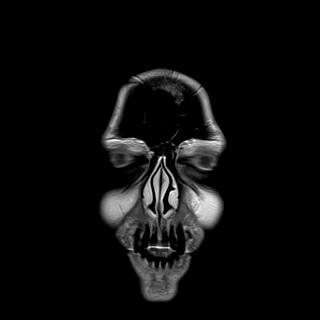
[im 16/32]
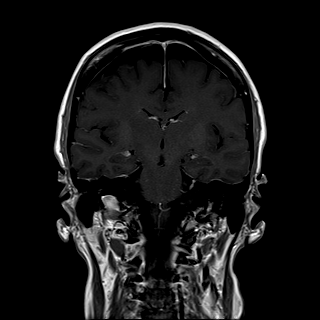
[im 32/32]
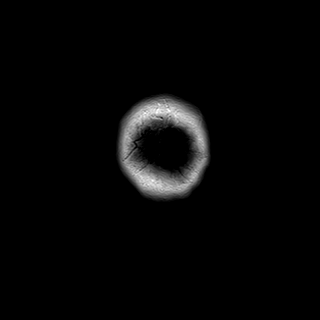

[Series 15: T1 post-contrast · sagittal · 4.0mm · 0.72mm/px · 3 of 27 slices shown (3 of 3)]
[im 1/27]
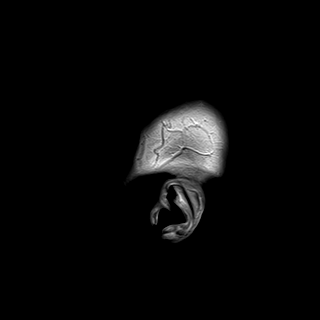
[im 14/27]
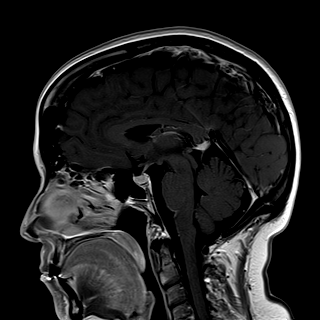
[im 27/27]
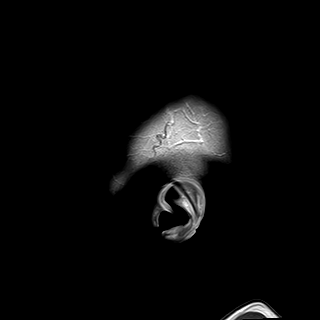

[46 of 48 positions shown; findings below may reference images not displayed]

FINDINGS: The ventricles, sulci and cisterns are within the range for normal taking into consideration patient's age. There is no evidence for a large mass or mass effect. There is no midline shift. Gray matter - white matter positions are normal. No abnormal extraaxial fluid collection is seen. No diffusion abnormality is noted to suggest acute to subacute infarction. Axial gradient echo images fail to demonstrate any abnormal areas of signal loss within the brain parenchyma.
No diffusion abnormality is noted to suggest acute to subacute infarction. Major intracranial vascular structures show a normal flow-related void. Cerebellar tonsils are normal in position. The pituitary gland is normal in size. No significant periventricular or subcortical white matter T2 or FLAIR signal hyperintensities are noted, entirely nonspecific, but in this age group, likely related to chronic small vessel changes. There is no evidence for acute to subacute infarction by diffusion imaging.
After contrast administration, no abnormal enhancement is seen within the brain parenchyma. No abnormal meningeal enhancement is demonstrated.
There is complete opacification of the left maxillary sinus. There is suggestion of fluid level. This can be seen with acute sinusitis. The remainder of the paranasal sinuses are grossly clear. The mastoid air cells are grossly clear. No calvarial abnormality.
IMPRESSION: 1. Essentially negative MRI evaluation of the brain as outlined above.
2. Left maxillary mucosal sinus disease. There is suggestion of fluid level. This can be seen with acute sinusitis.
Location 1
Is the patient pregnant?
No

## 2021-01-16 NOTE — Addendum Note
Addended by: Corinna Gab on: 01/16/2021 10:30 AM     Modules accepted: Orders

## 2021-01-17 ENCOUNTER — Ambulatory Visit: Payer: PRIVATE HEALTH INSURANCE | Attending: Acute Care

## 2021-01-18 ENCOUNTER — Inpatient Hospital Stay: Payer: PRIVATE HEALTH INSURANCE

## 2021-01-18 ENCOUNTER — Inpatient Hospital Stay
Admit: 2021-01-18 | Discharge: 2021-01-19 | Disposition: A | Discharge status: Home / Self Care | Payer: PRIVATE HEALTH INSURANCE | Source: Home / Self Care

## 2021-01-18 DIAGNOSIS — R1011 Right upper quadrant pain: Secondary | ICD-10-CM

## 2021-01-18 DIAGNOSIS — K828 Other specified diseases of gallbladder: Secondary | ICD-10-CM

## 2021-01-18 LAB — Prothrombin Time Panel: PROTHROMBIN TIME: 12.4 s (ref 11.5–14.4)

## 2021-01-18 LAB — Lipase: LIPASE: 30 U/L (ref 13–69)

## 2021-01-18 LAB — Differential Automated: EOSINOPHIL PERCENT, AUTO: 1.8 (ref 0.20–0.80)

## 2021-01-18 LAB — CBC: ABSOLUTE NUCLEATED RBC COUNT: 0 10*3/uL (ref 0.00–0.00)

## 2021-01-18 LAB — Basic Metabolic Panel
GFR ESTIMATE FOR AFRICAN AMERICAN: 69 mL/min/{1.73_m2} (ref 96–106)
SODIUM: 143 mmol/L (ref 135–146)

## 2021-01-18 LAB — Amylase: AMYLASE: 88 U/L (ref 31–124)

## 2021-01-18 LAB — Hepatic Funct Panel: BILIRUBIN,CONJUGATED: <0.2 mg/dL (ref <=0.3)

## 2021-01-18 LAB — Extra Light Green Top

## 2021-01-18 MED ADMIN — HYDROMORPHONE HCL 2 MG/ML IJ SOLN: .5 mg | INTRAVENOUS | @ 23:00:00 | Stop: 2021-01-18 | NDC 00409336511

## 2021-01-18 MED ADMIN — ONDANSETRON HCL 4 MG/2ML IJ SOLN: 4 mg | INTRAVENOUS | @ 23:00:00 | Stop: 2021-01-18 | NDC 60505613000

## 2021-01-18 MED ADMIN — SODIUM CHLORIDE 0.9 % IV BOLUS: 1000 mL | INTRAVENOUS | @ 23:00:00 | Stop: 2021-01-19 | NDC 00338004904

## 2021-01-18 NOTE — ED Notes
COLLECTIVE?NOTIFICATION?01/18/2021 09:55?Donna Gross?MRN: [id number]    [name]:   AVW:[id number]  Account [id number]  Billing Account [id number]      Criteria Met      2 Visits in 30 Days    6 Visits in 180 Days    Security and Safety  No recent Security Events currently on file    ED Care Guidelines  There are currently no ED Care Guidelines for this patient. Please check your facility's medical records system.            E.D. Visit Count (12 mo.)  Facility Visits   [facility] 4   [facility] 1   [facility] [place] 5   [address] 4   Total 14   Note: Visits indicate total known visits.     Recent Emergency Department Visit Summary  Showing 10 most recent visits out of 14 in the past 12 months  Date Facility [facility] Type Diagnoses or Chief Complaint   Jan 18, 2021 [facility] [place] [facility]     Jan 17, 2021 [name] [place] [facility]  Chief Complaint: CHEST PAIN; ABD PAIN; BACK PAIN    Jan 12, 2021 [facility] [place] [facility]      1. Unspecified abdominal pain      2. Abdominal Pain      Jan 11, 2021 [facility] [place] [facility]      -1. Headache, unspecified      -1. Dizziness and giddiness      0. Unspecified convulsions      5. Calculus of gallbladder without cholecystitis without obstruction      6. Dehydration      7. Acute kidney failure, unspecified      8. Peptic ulcer, site unspecified, unspecified as acute or chronic, without hemorrhage or perforation      9. Gastritis, unspecified, without bleeding      10. Gastro-esophageal reflux disease without esophagitis      11. Chronic pain syndrome      Dec 27, 2020 [facility] [place] [facility]      1. Abdominal Pain      1. Calculus of bile duct without cholangitis or cholecystitis without obstruction      Dec 14, 2020 [facility]. [place] [facility]      1. Unspecified abdominal pain 2. Abdominal Pain      2. Nausea with vomiting, unspecified      3. Nausea      Dec 06, 2020 [name] [place] [facility]  Chief Complaint: CHEST PAIN-ABD PAIN    Nov 23, 2020 [facility] [place] [facility]      1. Epigastric pain      2. Other dysphagia      3. Dysphagia      Nov 19, 2020 [name] [place] [facility]  Chief Complaint: N/V/ABD PAIN/CHEST PAIN    Jul 29, 2020 [name]  Chief Complaint: CHEST PAIN        Recent Inpatient Visit Summary  Date Facility [facility] Type Diagnoses or Chief Complaint   Dec 06, 2020 [name] [place] [facility]  Chief Complaint: CHEST PAIN-ABD PAIN    Jan 25, 2020 [facility]  1. Hypoglycemia, unspecified      2. Nausea with vomiting, unspecified      Jan 20, 2020 [facility] [place] [place] [facility]      Procedure and treatment not carried out because of patient's decision for other reasons      Long term (current) use of opiate analgesic      Major depressive disorder, single episode, unspecified      Acute pancreatitis without necrosis or infection, unspecified      Long term (current) use of aspirin      Other long term (current) drug therapy      Gastro-esophageal reflux disease without esophagitis      Family history of ischemic heart disease and other diseases of the circulatory system      Allergy status to analgesic agent      Chronic obstructive pulmonary disease, unspecified          Care Team  [facility] Special[facility] Phone Fax Service Dates   BUTLER, Josem Kaufmann, M.D. Family Medicine   Current    Sela Hilding, M.D. Family Medicine   Current      Collective Portal  This patient has registered at the [facility] Emergency Department   For more information visit: [email or web address]   PLEASE NOTE:     1.   Any care recommendations and other clinical information are provided as guidelines or for historical purposes only, and providers should exercise their own clinical judgment when providing care.    2.   You may only use this information for purposes of treatment, payment or health care operations activities, and subject to the limitations of applicable [employer] Policies.    3.   You should consult directly with the organization that provided a care guideline or other clinical history with any questions about additional information or accuracy or completeness of information provided.    ? 2022 [place], [employer] - [email or web address]

## 2021-01-18 NOTE — ED Provider Notes
[facility]  Emergency Department Service Report    Donna Gross 55 y.o. female , presents with Abdominal Pain      Triage   Arrived on 01/18/2021 at 9:55 AM   Arrived by Walk-in [14]    ED Triage Vitals   Temp Temp Source BP Heart Rate Resp SpO2 O2 Device Pain Score Weight   01/18/21 0958 01/18/21 0958 01/18/21 0958 01/18/21 0958 01/18/21 0958 01/18/21 0958 01/18/21 0958 01/18/21 1011 01/18/21 1012   36.1 ???C (96.9 ???F) Forehead 137/74 66 18 98 % None (Room air) Ten 67.5 kg (148 lb 13 oz)       Pre hospital care:       Allergies   Allergen Reactions   ??? Hydrocodone Other (See Comments)     ''burns a hole in stomach''  Tolerates hydromorphone   ??? Ibuprofen Other (See Comments)     GI discomfort    ''hurts my stomach''   ??? Morphine Itching     Tolerates hydromorphone       History   Patient has been previously diagnosed with gallstones now presenting with worsening right upper quadrant pain sharp nonradiating for the past 2 weeks.  Patient states he has been gradually worsening.  Patient also has nausea and vomiting daily denies any diarrhea or fever.  Patient states she is set up for surgery in a couple of weeks but the pain is persistent and worsening and wants to get checked to see if she can be evaluated sooner.               Past Medical History:   Diagnosis Date   ??? Anxiety    ??? Depression    ??? Emphysema, unspecified (HCC/RAF)    ??? Fibromyalgia    ??? Gastritis    ??? GERD (gastroesophageal reflux disease)    ??? Pancreatitis         Past Surgical History:   Procedure Laterality Date   ??? ABDOMINAL SURGERY     ??? COLON SURGERY          Past Family History   family history includes Diabetes in her brother and father; Heart disease in her mother.                 Past Social History   she reports that she has been smoking cigarettes. She has been smoking about 0.25 packs per day. She uses smokeless tobacco. She reports that she does not drink alcohol, does not use drugs, and does not engage in sexual activity.     Review of Systems   Constitutional: Negative for fever.   Gastrointestinal: Positive for abdominal pain, nausea and vomiting.   All other systems reviewed and are negative.      Physical Exam   Physical Exam  Vitals and nursing note reviewed.   Constitutional:       General: She is in acute distress.      Appearance: She is well-developed.   HENT:      Head: Normocephalic and atraumatic.   Eyes:      Conjunctiva/sclera: Conjunctivae normal.   Cardiovascular:      Rate and Rhythm: Normal rate and regular rhythm.   Pulmonary:      Effort: Pulmonary effort is normal. No respiratory distress.      Breath sounds: Normal breath sounds.   Abdominal:      Palpations: Abdomen is soft.      Tenderness: There is abdominal tenderness in the  right upper quadrant. There is no guarding or rebound. Negative signs include [name]'s sign.   Musculoskeletal:         General: Normal range of motion.      Cervical back: Normal range of motion and neck supple.   Lymphadenopathy:      Cervical: No cervical adenopathy.   Skin:     General: Skin is warm and dry.   Neurological:      Mental Status: She is alert.      Sensory: No sensory deficit.      Comments: Moving all four extremities.   Psychiatric:         Behavior: Behavior normal.         ED Course          Laboratory Results     Labs Reviewed   BASIC METABOLIC PANEL - Abnormal; Notable for the following components:       Result Value    Chloride 108 (*)     All other components within normal limits   CBC (PERFORMABLE) - Abnormal; Notable for the following components:    Red Blood Cell Count 3.90 (*)     Hemoglobin 10.8 (*)     MCH Concentration 30.9 (*)     Red Cell Distribution Width-SD 53.4 (*)     Red Cell Distribution Width-CV 16.3 (*)     Platelet Count, Auto 401 (*)     Mean Platelet Volume 8.9 (*)     All other components within normal limits   PROTHROMBIN TIME PANEL - Normal   HEPATIC FUNCT PANEL - Normal   LIPASE - Normal   AMYLASE - Normal   UA,DIPSTICK,POC - Normal   RAINBOW DRAW TO LABORATORY    Narrative:     The following orders were created for panel order [place] Draw to Laboratory.  Procedure                               Abnormality         Status                     ---------                               -----------         ------                     Extra Burna Mortimer ZOX[[id number]]                            Final result                 Please view results for these tests on the individual orders.   CBC & AUTO DIFFERENTIAL    Narrative:     The following orders were created for panel order CBC & Plt & Diff.  Procedure                               Abnormality         Status                     ---------                               -----------         ------  NFA[[id number]]                          Abnormal            Final result               Differential, Automated[[id number]]                          Final result                 Please view results for these tests on the individual orders.   EXTRA LIGHT GREEN TOP   DIFFERENTIAL, AUTOMATED (PERFORMABLE)   POCT URINALYSIS DIPSTICK       Imaging Results     [place] abd complete   Final Result by Joetta Manners E., DO (01/29 1648)   IMPRESSION:      1.  Findings are favored to represent tumefactive sludge in the gallbladder. No evidence of acute cholecystitis. However, while there is no color Doppler flow evident within the suspected sludge, would recommend follow-up MRCP to exclude possible    neoplastic process.   2.  Mild intra- and extrahepatic biliary ductal dilation, present since 2016. Common bile duct enlarged to 1.4 cm, previously 1.1 cm on January 23, 2020.   3.  Dilated main pancreatic duct measuring 3 mm, present since at least 2020.      I, Joetta Manners, M.D., have reviewed this radiological study personally and I am in full agreement with the findings of the report presented here.      Dictated by: Kelton Pillar   01/18/2021 4:32 PM      Signed by: Joetta Manners   01/18/2021 4:48 PM Administered Medications     Medication Administration from 01/18/2021 0955 to 01/18/2021 1701       Date/Time Order Dose Route Action Action by Comments     01/18/2021 1444 HYDROmorphone 2 mg/mL inj 0.5 mg 0.5 mg IV Push Given Kary Kos, RN      01/18/2021 1444 ondansetron 4 mg/2 mL inj 4 mg 4 mg Intravenous Given Kary Kos, RN      01/18/2021 1443 sodium chloride 0.9% IV soln bolus 1,000 mL 1,000 mL Intravenous New Bag/ Syringe/ Cartridge Kary Kos, RN      01/18/2021 1645 ondansetron 4 mg/2 mL inj 4 mg 4 mg Intravenous Given Ala Dach, RN      01/18/2021 1645 HYDROmorphone 2 mg/mL inj 0.5 mg 0.5 mg IV Push Given Ala Dach, RN           Procedures   Procedural Sedation  Procedures  Observation time:  Patient has no family history that is relevant to this complaint.   Patient first seen at 1046.   Observation began at 1056 and was necessary in order to determine etiology of abdominal pain.   Upon re-evaluation, observation revealed that the patient should be discharged.     Patient medically cleared for discharge at 1654  Total time of observation was greater than 3 hours.       MDM  Number of Diagnoses or Management Options     Amount and/or Complexity of Data Reviewed  Clinical lab tests: ordered and reviewed  Tests in the radiology section of CPT???: ordered and reviewed  Tests in the medicine section of CPT???: ordered and reviewed  Review and summarize past medical records: yes  Independent visualization of images, tracings, or  specimens: yes      Data Reviewed/Counseling: I have reviewed the patient's vital signs, nursing notes and old medical records. I had a detailed discussion with the patient regarding the historical points, exam findings, and any diagnostic results supporting the discharge diagnosis. I also discussed lab results, radiology results, the need for outpatient follow-up and the need to return to the ED if symptoms worsen or if there are any questions or concerns that arise at home.      Patient presents with abdominal pain. Afebrile, normal pulse ox. Labs within normal limits with no evidence of electrolyte abnormalities such as hyponatremia, hyperglycemia. Labs show no evidence of pancreatitis or hepatitis. Urine shows no evidence of infection. Radiology studies, ultrasound abdomen performed to evaluate for biliary pathology or cholecystitis and shows gallbladder sludge. Patient markedly improved in ER with IV pain medications, IV antiemetics, and IV hydration. Advise close follow up with primary physician     Clinical Impression     1. Abdominal pain, acute, right upper quadrant    2. Gallbladder sludge        Prescriptions     New Prescriptions    ONDANSETRON ODT 4 MG DISINTEGRATING TABLET    Take 1 tablet (4 mg total) by mouth every six (6) hours as needed for Nausea or Vomiting.    OXYCODONE-ACETAMINOPHEN 5-325 MG TABLET    Take 2 tablets by mouth every four (4) hours as needed. Max Daily Amount: 12 tablets       Disposition and Follow-up   Disposition: Discharge [1]    No future appointments.    Follow up with:  Kelle Darting, MD  [address]  [place] [address]  [phone]    In 1 week        Return precautions are specified on After Visit Summary.                 Karren Burly., MD  01/18/21 1701

## 2021-01-19 LAB — UA,Microscopic: WBCS HPF: 0 {cells}/[HPF] (ref 0–4)

## 2021-01-19 LAB — UA,Dipstick: BLOOD: NEGATIVE (ref 5.0–8.0)

## 2021-01-19 LAB — UA,Dipstick,POC: PROTEIN UR,POC: NEGATIVE (ref 5.0–8.0)

## 2021-01-19 MED ORDER — ONDANSETRON 4 MG PO TBDP
4 mg | ORAL_TABLET | Freq: Four times a day (QID) | ORAL | 0 refills | Status: AC | PRN
Start: 2021-01-19 — End: ?

## 2021-01-19 MED ORDER — ONDANSETRON 4 MG PO TBDP
4 mg | ORAL_TABLET | Freq: Four times a day (QID) | ORAL | 0 refills | 8.00000 days | Status: AC | PRN
Start: 2021-01-19 — End: ?

## 2021-01-19 MED ORDER — OXYCODONE-ACETAMINOPHEN 5-325 MG PO TABS
2 | ORAL_TABLET | ORAL | 0 refills | Status: AC | PRN
Start: 2021-01-19 — End: ?

## 2021-01-19 MED ADMIN — ONDANSETRON HCL 4 MG/2ML IJ SOLN: 4 mg | INTRAVENOUS | @ 01:00:00 | Stop: 2021-01-19 | NDC 60505613000

## 2021-01-19 MED ADMIN — HYDROMORPHONE HCL 2 MG/ML IJ SOLN: .5 mg | INTRAVENOUS | @ 01:00:00 | Stop: 2021-01-19 | NDC 00409336511

## 2021-01-19 NOTE — ED Notes
Pt advised to FU with PMD within 2 days and to return to ED for worsening symptoms. Pt Aox4. No work of breath or accessory muscle use noted. Speaks in full sentences, RR even/unlabored. Skin warm and dry. Denies CP, SOB and dizziness. +CSM. Pt provided AVS and RX and verbalized understanding. Pt stable at time of DC.

## 2021-01-19 NOTE — ED Notes
Report received from [facility]. Pt reports return of abdominal pain s/p [place]. Requesting pain medication. MD aware, awaiting orders. NS infusing into right hand IV, site unremarkable.

## 2021-01-24 ENCOUNTER — Telehealth: Payer: PRIVATE HEALTH INSURANCE

## 2021-01-24 NOTE — Telephone Encounter
Results Request - The patient would like to discuss the results of their recent tests.     1) What type of test(s)? MRI gallbladder    2) When was it performed? 02/3     3) Where was it performed? [facility]    If [facility], are results available in [place]? No  If outside facility, what is their phone number?   Phone [phone]  Fax [phone]    Patient or caller has been notified of the 24-48 hour turnaround time.

## 2021-01-24 NOTE — Telephone Encounter
Called pt and told her we haven't received any MRI results from [facility]. Pt stated she will call them and have them fax results over to [place].

## 2021-01-24 NOTE — Telephone Encounter
Message to Practice/Provider      Message: Pt called regarding message below, advises she reached out to the imaging center and they will be faxing over the report within 24 hours. Pt will call back next week to confirm its received.     Return call is not being requested by the patient or caller.    Patient or caller has been notified of the 24-48 hour processing turnaround time if applicable.    Sue Lush F, [place]     [place]   01/24/21 9:22 AM  Note  Called pt and told her we haven't received any MRI results from [facility]. Pt stated she will call them and have them fax results over to [place].

## 2021-02-03 NOTE — Progress Notes
GENERAL SURGERY CONSULTATION     PATIENT: Donna Gross  MRN: [id number]  DOB: [birth year]  DATE OF SERVICE: 02/06/2021     PRIMARY CARE PROVIDER: Kelle Darting, MD    Subjective:     History of Present Illness:  Donna Gross is a 55 y.o. female with a history of refractory ulcer disease s/p lap vagotomy and antrectomy with GJ and hiatal hernia repair with LOA, GERD, fibromyalgia, pancreatitis, who presents for right sided abdominal pain.    Patient reports going to [facility] twice in 11/2020 for RUQ abdominal pain an was diagnosed with choledocholithiasis each time. The surgeon there offered to operate on her, but she wanted to have her operation done at [facility] if she were to have a surgery to remove he gallbladder. Whenever she eats, she gets the pain, so she started eating just 1 meal a day. Then she suffered a seizure d/t hypoglycemia over the weekend, BG 64 per patient report.     The patient is now here to discuss possible surgical intervention.      2/17: Recent MRI 01/23/21 with cholelithiasis, no e/o cholecystitis or choledocholithiasis. Reports continued abdominal pain. She has pain with eating, and was only able to drink an Ensure this morning.     Past Medical History:   Past Medical History:   Diagnosis Date   ??? Anxiety    ??? Depression    ??? Emphysema, unspecified (HCC/RAF)    ??? Fibromyalgia    ??? Gastritis    ??? GERD (gastroesophageal reflux disease)    ??? Pancreatitis        Past Surgical History:   Past Surgical History:   Procedure Laterality Date   ??? ABDOMINAL SURGERY     ??? COLON SURGERY         Scheduled Meds:  Current Outpatient Medications   Medication Sig   ??? aluminum-magnesium hydroxide-simethicone 400-400-40 mg/5 mL suspension Take 10 mLs by mouth every four (4) hours as needed. (Patient taking differently: Take 10 mLs by mouth every four (4) hours as needed for Indigestion .)   ??? clobetasol 0.05% cream Apply topically three (3) times daily as needed (rash).   ??? FLUoxetine 40 mg capsule Take 40 mg by mouth daily.   ??? fluticasone-salmeterol (WIXELA INHUB) 250-50 mcg/dose diskus Inhale 1 puff two (2) times daily as needed (SOB).   ??? HYDROcodone-acetaminophen 5-325 mg tablet Take 1 tablet by mouth every six (6) hours as needed for Severe Pain (Pain Scale 7-10).   ??? ipratropium-albuterol 20-100 mcg/act inhaler Inhale 2 puffs three (3) times daily as needed (SOB).   ??? lidocaine viscous 2% oral solution Swish and spit 5 mLs every four (4) hours as needed (indigestions) .   ??? metoclopramide 10 mg tablet Take 1 tablet (10 mg total) by mouth three (3) times daily before meals.   ??? multivitamin tablet Take 1 tablet by mouth daily.   ??? Naloxone HCl 4 MG/0.1ML LIQD Call 911. Administer a single spray intranasally into one nostril for opioid overdose. May repeat in 3 minutes if patient is not breathing.Marland Kitchen   ??? ondansetron ODT 4 mg disintegrating tablet Take 1 tablet (4 mg total) by mouth every six (6) hours as needed for Nausea or Vomiting.   ??? ondansetron ODT 4 mg disintegrating tablet Take 1 tablet (4 mg total) by mouth every six (6) hours as needed for Nausea or Vomiting.   ??? ondansetron ODT 4 mg disintegrating tablet Take 1 tablet (4 mg total) by mouth every six (  6) hours as needed for Nausea or Vomiting.   ??? oxyCODONE 5 mg tablet Take 1 tablet (5 mg total) by mouth every six (6) hours as needed for Moderate Pain (Pain Scale 4-6) or Severe Pain (Pain Scale 7-10). Max Daily Amount: 20 mg   ??? oxyCODONE-acetaminophen 5-325 mg tablet Take 1 tablet by mouth every four (4) hours as needed. Max Daily Amount: 6 tablets   ??? oxyCODONE-acetaminophen 5-325 mg tablet Take 2 tablets by mouth every four (4) hours as needed. Max Daily Amount: 12 tablets   ??? oxyCODONE-acetaminophen 5-325 mg tablet Take 2 tablets by mouth every four (4) hours as needed. Max Daily Amount: 12 tablets   ??? pantoprazole 40 mg DR tablet Take 40 mg by mouth daily .   ??? promethazine 6.25 mg/5 mL solution Take 6.25 mg by mouth every six (6) hours as needed (cough). ??? quetiapine 100 mg tablet Take 1 tablet (100 mg total) by mouth two (2) times daily.   ??? sucralfate 1 g/10 mL suspension Take 10 mLs (1 g total) by mouth three (3) times daily with meals and at bedtime.   ??? traZODone 100 mg tablet Take 100 mg by mouth at bedtime.     No current facility-administered medications for this visit.       Allergies: Hydrocodone, Ibuprofen, and Morphine    Family History:   Family History   Problem Relation Age of Onset   ??? Heart disease Mother    ??? Diabetes Brother    ??? Diabetes Father    ??? Anesthesia problems Neg Hx    ??? Malignant hypertension Neg Hx    ??? Hypotension Neg Hx    ??? Malignant hyperthermia Neg Hx    ??? Pseudochol deficiency Neg Hx        Review of Systems: A 14 point review of systems was reviewed with the patient. In addition to the pertinent positives noted above. The remainder of the review of systems is negative.      Objective:     Physical Exam:  Last Recorded Vital Signs:    02/06/21 0905   BP: 129/81   Pulse: 68   Temp: 36.4 ???C (97.5 ???F)   SpO2: 96%     Body mass index is 25.29 kg/m???.   Vitals:    02/06/21 0905   Weight: 66.8 kg (147 lb 5.1 oz)   Height: 1.626 m (5' 4'')       Vitals:    02/06/21 0905   Weight: 66.8 kg (147 lb 5.1 oz)   Height: 1.626 m (5' 4'')       General: NAD  Head: Normocephalic, atraumatic.   Eyes: EOMI  Nose: Wearing mask  Heart: Regular rate  Chest:  Normal WOB  Abdomen: Soft, mildly distended on right side. Numerous well-healed scars - midline incision, prior laparoscopy scars. TTP in epigastrium. Negative [name]'s.   Extremities:  No clubbing, cyanosis, or edema.    Laboratory Review:  Lab Results   Component Value Date    WBC 7.33 01/18/2021    HGB 10.8 (L) 01/18/2021    HCT 35.0 01/18/2021    MCV 89.7 01/18/2021    PLT 401 (H) 01/18/2021     No results found for this or any previous visit (from the past 24 hour(s)).    Imaging Review: [place] abd complete    Result Date: 01/18/2021  IMPRESSION: 1.  Findings are favored to represent tumefactive sludge in the gallbladder. No evidence of acute cholecystitis. However, while  there is no color Doppler flow evident within the suspected sludge, would recommend follow-up MRCP to exclude possible neoplastic process. 2.  Mild intra- and extrahepatic biliary ductal dilation, present since 2016. Common bile duct enlarged to 1.4 cm, previously 1.1 cm on January 23, 2020. 3.  Dilated main pancreatic duct measuring 3 mm, present since at least 2020. I, Joetta Manners, M.D., have reviewed this radiological study personally and I am in full agreement with the findings of the report presented here. Dictated by: Kelton Pillar   01/18/2021 4:32 PM Signed by: Joetta Manners   01/18/2021 4:48 PM    CT abd+pelvis w contrast    Result Date: 01/12/2021  IMPRESSION: 1. Postsurgical changes of gastrojejunostomy without evidence for bowel obstruction. 2. Stable appearance of moderate intrahepatic biliary ductal dilatation and dilatation of the common bile duct since 2016. Signed by: Burman Nieves   01/12/2021 9:38 AM    MRI A/P W/WO CONTRAST 01/23/21        Assessment/Plan:     Donna Gross is a 55 y.o. female with a history of refractory ulcer disease s/p lap vagotomy and antrectomy with GJ and hiatal hernia repair with LOA, GERD, fibromyalgia, pancreatitis, who presents for right sided abdominal pain. Patient was given dx of choledocholithiasis at [facility] though on CT, no obvious stones.     Repeat MRCP w/ cholelithiasis, no e/o choledocholithiasis or cholecystisis. Continues to have abdominal pain, N/V, though more epigastric and negative [name]'s. Discussed that cholecystectomy may or may not alleviate these symptoms, based on imaging findings and exam; and that surgery could contribute to further adhesions which may worsen sx. Would recommend further workup to rule out alternative causes of abdominal pain prior to proceeding with operative management. Would recommend referral to gastroenterologist to further evaluate GI symptoms, consideration of repeat endoscopy. Will also order HIDA scan to evaluate gallbladder.    1. Right upper quadrant abdominal pain  - Recommend referral to GI for further workup of abdominal pain, consider repeat upper endoscopy   - NM Hepatobiliary Hida; Future  - F/u after imaging     Author: [name]. Ave Filter, MD 02/06/2021 9:46 AM  General Surgery  [facility]

## 2021-02-06 ENCOUNTER — Ambulatory Visit: Payer: PRIVATE HEALTH INSURANCE

## 2021-02-06 DIAGNOSIS — K8020 Calculus of gallbladder without cholecystitis without obstruction: Secondary | ICD-10-CM

## 2021-02-10 ENCOUNTER — Inpatient Hospital Stay
Admit: 2021-02-10 | Discharge: 2021-02-10 | Disposition: A | Discharge status: Home / Self Care | Payer: PRIVATE HEALTH INSURANCE | Source: Home / Self Care

## 2021-02-10 ENCOUNTER — Ambulatory Visit: Payer: PRIVATE HEALTH INSURANCE

## 2021-02-10 DIAGNOSIS — K828 Other specified diseases of gallbladder: Secondary | ICD-10-CM

## 2021-02-10 DIAGNOSIS — R1011 Right upper quadrant pain: Secondary | ICD-10-CM

## 2021-02-10 DIAGNOSIS — G8929 Other chronic pain: Secondary | ICD-10-CM

## 2021-02-10 LAB — Amylase: AMYLASE: 111 U/L (ref 31–124)

## 2021-02-10 LAB — Albumin: ALBUMIN: 4 g/dL (ref 3.9–5.0)

## 2021-02-10 LAB — Bilirubin,Conjugated: BILIRUBIN,CONJUGATED: <0.2 mg/dL (ref <=0.3)

## 2021-02-10 LAB — UA,Microscopic: RBCS HPF: 0 {cells}/[HPF] (ref 0–2)

## 2021-02-10 LAB — Aspartate Aminotransferase: ASPARTATE AMINOTRANSFERASE: 20 U/L (ref 13–62)

## 2021-02-10 LAB — Extra Light Green Top

## 2021-02-10 LAB — UA,Dipstick: PH,URINE: 5.5 (ref 5.0–8.0)

## 2021-02-10 LAB — Basic Metabolic Panel
CREATININE: 1.14 mg/dL (ref 0.60–1.30)
GLUCOSE: 130 mg/dL — ABNORMAL HIGH (ref 65–99)

## 2021-02-10 LAB — CBC: NEUTROPHILS ABS (PRELIM): 3.33 10*3/uL (ref 143–398)

## 2021-02-10 LAB — Lipase: LIPASE: 39 U/L (ref 13–69)

## 2021-02-10 LAB — Alkaline Phosphatase: ALKALINE PHOSPHATASE: 87 U/L (ref 37–113)

## 2021-02-10 LAB — Alanine Aminotransferase: ALANINE AMINOTRANSFERASE: 12 U/L (ref 8–70)

## 2021-02-10 LAB — Differential Automated: ABSOLUTE IMMATURE GRAN COUNT: 0.01 10*3/uL (ref 0.00–0.04)

## 2021-02-10 MED ORDER — ONDANSETRON 4 MG PO TBDP
4 mg | ORAL_TABLET | Freq: Four times a day (QID) | ORAL | 0 refills | Status: AC | PRN
Start: 2021-02-10 — End: ?

## 2021-02-10 MED ORDER — OXYCODONE-ACETAMINOPHEN 5-325 MG PO TABS
1 | ORAL_TABLET | Freq: Four times a day (QID) | ORAL | 0 refills | Status: AC | PRN
Start: 2021-02-10 — End: ?

## 2021-02-10 MED ADMIN — SODIUM CHLORIDE 0.9 % IV BOLUS: 1000 mL | INTRAVENOUS | @ 17:00:00 | Stop: 2021-02-10 | NDC 00338004904

## 2021-02-10 MED ADMIN — FENTANYL CITRATE (PF) 100 MCG/2ML IJ SOLN: 50 ug | INTRAVENOUS | @ 17:00:00 | Stop: 2021-02-10 | NDC 00641602701

## 2021-02-10 MED ADMIN — OXYCODONE-ACETAMINOPHEN 5-325 MG PO TABS: 1 | ORAL | @ 22:00:00 | Stop: 2021-02-10 | NDC 00406051262

## 2021-02-10 MED ADMIN — HYDROMORPHONE HCL 1 MG/ML IJ SOLN: 1 mg | INTRAVENOUS | @ 19:00:00 | Stop: 2021-02-10 | NDC 00409128331

## 2021-02-10 MED ADMIN — ONDANSETRON HCL 4 MG/2ML IJ SOLN: 4 mg | INTRAVENOUS | @ 17:00:00 | Stop: 2021-02-10 | NDC 60505613000

## 2021-02-10 NOTE — ED Notes
Report received from [facility].

## 2021-02-10 NOTE — ED Notes
Verbal report received from Susa Raring  for continuity of pt care in emergency department for lunch break

## 2021-02-10 NOTE — ED Notes
Surgery at bedside.

## 2021-02-10 NOTE — Consults
MD Referral/Dr. Bernadene Bell       Patient Name: Donna Gross, Donna Gross    MRN: [id number]   Request: GI - patient is connected to [place] (Dr. Ave Filter- general surgery). Would like [facility]    Insurance: [employer]    Diagnosis: Upper abdominal pain    Contact Number: [phone] or [phone]   Time Frame: one week       Thank you!     Confirmed patient demographics at bedside, all questions addressed. Patient verbalized understanding.Provided ED CM contact number.   Email sent to [facility] to arrange followup appointment.

## 2021-02-10 NOTE — Consults
GENERAL SURGERY CONSULTATION NOTE    PATIENT: Donna Gross  MRN: [id number]  DOB: 04-15-1966  DATE OF SERVICE: 02/10/2021   ATTENDING: Shelly Flatten., MD    Subjective:     Reason for Consult: Abdominal pain, query biliary etiology    History of Present Illness:  Donna Gross is a 55 y.o. female with a history of peptic ulcer disease, known cholelithiasis, and extensive past surgical history including Cheree Ditto patch, ileostomy, abdominal hernia repair, and most recently vagotomy, antrectomy, gastrojejunostomy, and extensive lysis of adhesions (06/2019) who presents with persistent abdominal pain. Donna Gross reports that the pain is postprandial with each meal and is primarily in the epigastrium and right abdomen (also has tenderness in the lower abdomen). The pain intense and unabating. The pain is associated with ''swelling'' and distention in the right abdomen. No significant nausea/vomiting. No fever or chills.     Patient was recently seen in general surgery clinic for evaluation of these symptoms and possible cholecystectomy. Donna Gross has had multiple studies that demonstrate cholelithiasis without evidence of cholecystitis. She also has a chronically dilated common bile duct and dilated intrahepatic bile ducts; however, multiple MRCPs have failed to demonstrate a stricture, choledocholithiasis, or other obstructive cause of the ductal dilation.    Past History:  Past Medical History:   Diagnosis Date    Anxiety     Depression     Emphysema, unspecified (HCC/RAF)     Fibromyalgia     Gastritis     GERD (gastroesophageal reflux disease)     Pancreatitis         Past Surgical History:   Procedure Laterality Date    ABDOMINAL SURGERY      COLON SURGERY         Family History:   Family History   Problem Relation Age of Onset    Heart disease Mother     Diabetes Brother     Diabetes Father     Anesthesia problems Neg Hx     Malignant hypertension Neg Hx     Hypotension Neg Hx     Malignant hyperthermia Neg Hx Pseudochol deficiency Neg Hx        Social History:   Social History     Socioeconomic History    Marital status: Single     Spouse name: Not on file    Number of children: Not on file    Years of education: Not on file    Highest education level: Not on file   Occupational History    Not on file   Tobacco Use    Smoking status: Current Every Day Smoker     Packs/day: 0.25     Types: Cigarettes    Smokeless tobacco: Current User    Tobacco comment: 30 years    Substance and Sexual Activity    Alcohol use: No    Drug use: No    Sexual activity: Never   Other Topics Concern    Not on file   Social History Narrative    Not on file     Social Determinants of Health     Financial Resource Strain: Not on file   Physical Activity: Not on file   Stress: Not on file       Allergies:   Allergies   Allergen Reactions    Hydrocodone Other (See Comments)     ''burns a hole in stomach''  Tolerates hydromorphone    Ibuprofen Other (See Comments)  GI discomfort    ''hurts my stomach''    Morphine Itching     Tolerates hydromorphone        Medications:   Home medications:   Prior to Admission medications    Medication Sig Start Date End Date Taking? Authorizing Provider   aluminum-magnesium hydroxide-simethicone 400-400-40 mg/5 mL suspension Take 10 mLs by mouth every four (4) hours as needed.  Patient taking differently: Take 10 mLs by mouth every four (4) hours as needed for Indigestion . 07/02/19   Modesto Charon., MD   clobetasol 0.05% cream Apply topically three (3) times daily as needed (rash).    [provider]   FLUoxetine 40 mg capsule Take 40 mg by mouth daily.    [provider]   fluticasone-salmeterol (WIXELA INHUB) 250-50 mcg/dose diskus Inhale 1 puff two (2) times daily as needed (SOB).    [provider]   HYDROcodone-acetaminophen 5-325 mg tablet Take 1 tablet by mouth every six (6) hours as needed for Severe Pain (Pain Scale 7-10).    [provider]   ipratropium-albuterol 20-100 mcg/act inhaler Inhale 2 puffs three (3) times daily as needed (SOB).    [provider]   lidocaine viscous 2% oral solution Swish and spit 5 mLs every four (4) hours as needed (indigestions) .    [provider]   metoclopramide 10 mg tablet Take 1 tablet (10 mg total) by mouth three (3) times daily before meals. 01/29/20   Earlean Polka, MD   multivitamin tablet Take 1 tablet by mouth daily. 01/29/20   Earlean Polka, MD   Naloxone HCl 4 MG/0.1ML LIQD Call 911. Administer a single spray intranasally into one nostril for opioid overdose. May repeat in 3 minutes if patient is not breathing.. 07/02/19   Modesto Charon., MD   ondansetron ODT 4 mg disintegrating tablet Take 1 tablet (4 mg total) by mouth every six (6) hours as needed for Nausea or Vomiting. 01/12/21   Dabby, Jamison Oka., MD   ondansetron ODT 4 mg disintegrating tablet Take 1 tablet (4 mg total) by mouth every six (6) hours as needed for Nausea or Vomiting. 01/18/21   Halem, Illene Silver., MD   ondansetron ODT 4 mg disintegrating tablet Take 1 tablet (4 mg total) by mouth every six (6) hours as needed for Nausea or Vomiting. 01/18/21   Halem, Illene Silver., MD   oxyCODONE 5 mg tablet Take 1 tablet (5 mg total) by mouth every six (6) hours as needed for Moderate Pain (Pain Scale 4-6) or Severe Pain (Pain Scale 7-10). Max Daily Amount: 20 mg 12/27/20   Elsie Stain., MD   oxyCODONE-acetaminophen 5-325 mg tablet Take 1 tablet by mouth every four (4) hours as needed. Max Daily Amount: 6 tablets 01/12/21   Dabby, Jamison Oka., MD   oxyCODONE-acetaminophen 5-325 mg tablet Take 2 tablets by mouth every four (4) hours as needed. Max Daily Amount: 12 tablets 01/18/21   Halem, Illene Silver., MD   oxyCODONE-acetaminophen 5-325 mg tablet Take 2 tablets by mouth every four (4) hours as needed. Max Daily Amount: 12 tablets 01/18/21   Halem, Illene Silver., MD   pantoprazole 40 mg DR tablet Take 40 mg by mouth daily .    [provider]   promethazine 6.25 mg/5 mL solution Take 6.25 mg by mouth every six (6) hours as needed (cough).    [provider]   quetiapine 100 mg tablet Take 1 tablet (100 mg total) by mouth  two (2) times daily. 12/27/15   Deniece Ree, NP   sucralfate 1 g/10 mL suspension Take 10 mLs (1 g total) by mouth three (3) times daily with meals and at bedtime. 07/02/19   Modesto Charon., MD   traZODone 100 mg tablet Take 100 mg by mouth at bedtime.    [provider]       Review of Systems: Review of systems was negative except as noted per HPI.    Objective:     Physical Exam:    Vital Signs: BP 116/77  ~ Pulse 68  ~ Temp 36.4 ???C (97.6 ???F) (Oral)  ~ Resp 18  ~ SpO2 93%       Gen: NAD, alert, appropriate  HEENT: NCAT, EOMI, oropharynx clear with MMM  CV: RRR  RESP: Breathing unlabored, CTAB  ABD: Soft, mild distention, mildly tender in the epigastrium, right, and lower abdomen. Negative [name]'s sign. Surgical incisions well-healed. No palpable fascial defects.  EXTR: WWP  Skin: no jaundice, rashes, or excoriations  Neuro: no gross focal deficits    Labs:    Recent Results (from the past 24 hour(s))   ED INFORMATION EXCHANGE    Collection Time: 02/10/21  7:23 AM   Result Value Ref Range    Emer. Dept. Info Exchange - Care Plan      Emer. Dept. Engineer, mining. Dept. Info Exchange - 30 day Visit Count 7     Emer. Dept. Info Exchange - 180 day Visit Count 13    Basic Metabolic Panel    Collection Time: 02/10/21  7:56 AM   Result Value Ref Range    Sodium 138 135 - 146 mmol/L    Potassium 4.6 3.6 - 5.3 mmol/L    Chloride 106 96 - 106 mmol/L    Total CO2 19 (L) 20 - 30 mmol/L    Anion Gap 13 8 - 19 mmol/L    Glucose 130 (H) 65 - 99 mg/dL    Creatinine 4.54 0.98 - 1.30 mg/dL    GFR Estimate for African American 63 See GFR Additional Information mL/min/1.81m2    GFR Estimate for Non-African American 54 See GFR Additional Information mL/min/1.80m2    GFR Additional Information See Comment     Urea Nitrogen 17 7 - 22 mg/dL Calcium 9.5 8.6 - 11.9 mg/dL   ALT (SGPT)    Collection Time: 02/10/21  7:56 AM   Result Value Ref Range    Alanine Aminotransferase 12 8 - 70 U/L   AST (SGOT)    Collection Time: 02/10/21  7:56 AM   Result Value Ref Range    Aspartate Aminotransferase 20 13 - 62 U/L   Bilirubin,Conj    Collection Time: 02/10/21  7:56 AM   Result Value Ref Range    Bilirubin,Conjugated <0.2 <=0.3 mg/dL   Alkaline Phosphatase    Collection Time: 02/10/21  7:56 AM   Result Value Ref Range    Alkaline Phosphatase 87 37 - 113 U/L   Albumin    Collection Time: 02/10/21  7:56 AM   Result Value Ref Range    Albumin 4.0 3.9 - 5.0 g/dL   Lipase    Collection Time: 02/10/21  7:56 AM   Result Value Ref Range    Lipase 39 13 - 69 U/L   Amylase    Collection Time: 02/10/21  7:56 AM   Result Value Ref Range    Amylase 111 31 - 124  U/L   Extra Burna Mortimer Top    Collection Time: 02/10/21  7:56 AM   Result Value Ref Range    Extra Tube Performed    CBC    Collection Time: 02/10/21  7:56 AM   Result Value Ref Range    White Blood Cell Count 6.19 4.16 - 9.95 x10E3/uL    Red Blood Cell Count 3.91 (L) 3.96 - 5.09 x10E6/uL    Hemoglobin 11.0 (L) 11.6 - 15.2 g/dL    Hematocrit 16.1 09.6 - 45.2 %    Mean Corpuscular Volume 89.8 79.3 - 98.6 fL    Mean Corpuscular Hemoglobin 28.1 26.4 - 33.4 pg    MCH Concentration 31.3 (L) 31.5 - 35.5 g/dL    Red Cell Distribution Width-SD 52.1 (H) 36.9 - 48.3 fL    Red Cell Distribution Width-CV 15.8 (H) 11.1 - 15.5 %    Platelet Count, Auto 367 143 - 398 x10E3/uL    Mean Platelet Volume 8.4 (L) 9.3 - 13.0 fL    Nucleated RBC%, automated 0.0 No Ref. Range %    Absolute Nucleated RBC Count 0.00 0.00 - 0.00 x10E3/uL    Neutrophil Abs (Prelim) 3.33 See Absolute Neut Ct. x10E3/uL   Differential, Automated    Collection Time: 02/10/21  7:56 AM   Result Value Ref Range    Neutrophil Percent, Auto 53.8 No Ref. Range %    Lymphocyte Percent, Auto 38.4 No Ref. Range %    Monocyte Percent, Auto 4.7 No Ref. Range %    Eosinophil Percent, Auto 2.4 No Ref. Range %    Basophil Percent, Auto 0.5 No Ref. Range %    Immature Granulocytes% 0.2 No Reference Range %    Absolute Neut Count 3.33 1.80 - 6.90 x10E3/uL    Absolute Lymphocyte Count 2.38 1.30 - 3.40 x10E3/uL    Absolute Mono Count 0.29 0.20 - 0.80 x10E3/uL    Absolute Eos Count 0.15 0.00 - 0.50 x10E3/uL    Absolute Baso Count 0.03 0.00 - 0.10 x10E3/uL    Absolute Immature Gran Count 0.01 0.00 - 0.04 x10E3/uL   UA,Dipstick    Collection Time: 02/10/21  8:02 AM    Specimen: Clean Catch, Midstream; Urine   Result Value Ref Range    Urine Color Light-Yellow      Specific Gravity 1.023 1.005 - 1.030    pH,Urine 5.5 5.0 - 8.0    Blood Negative Negative    Bilirubin Negative Negative    Ketones Negative Negative    Glucose Negative Negative    Protein Negative Negative    Leukocyte Esterase Negative Negative    Nitrite Negative Negative   UA,Microscopic    Collection Time: 02/10/21  8:02 AM    Specimen: Clean Catch, Midstream; Urine   Result Value Ref Range    RBC per uL 1 0 - 11 cells/uL    WBC per uL 4 0 - 22 cells/uL    RBC per HPF 0 0 - 2 cells/HPF    WBC per HPF 1 0 - 4 cells/HPF    Squamous Epi Cells 7 0 - 17 cells/uL       Studies:  [place] (2/21):  FINDINGS:     Pancreas: Partially visualized and unremarkable. Mild prominence of the main pancreatic duct measuring 2.8 mm.     Liver: Normal in size and homogeneous in echogenicity.      Focal liver lesions: None.     Portal vein: Normal, hepatopetal flow.     Gallbladder:  Normally distended, containing tumefactive sludge, similar in appearance to prior ultrasound. No gallbladder wall thickening. No sonographic [name]'s sign.     Bile ducts: Mild intra- and extrahepatic biliary ductal dilation. The common bile duct measures up to 9 mm, previously 14 mm.     Right kidney: Normal in size and cortical thickness. No hydronephrosis.     Ascites: None in the upper abdomen.     Visualized proximal aorta and IVC: Atherosclerosis of the abdominal aorta. Unremarkable IVC.     MEASUREMENTS:  Liver: 15.3 cm   Common Duct: 8.0 mm   Right Kidney: 10.1 cm   Aorta: 1.8 cm         IMPRESSION:     1. Similar appearance of the gallbladder with echogenic intraluminal substance, favored to represent tumefactive sludge. However, while is no Doppler color flow identified, follow-up MRCP is recommended to exclude gallbladder neoplasia. No sonographic   evidence for acute cholecystitis.     2. Mild intra and extrahepatic biliary ductal dilatation. Interval decrease in size of the common bile duct measuring 8 mm on today's exam, previously 14 mm on prior ultrasound of 01/18/2021.    MRCP (2/3):      Assessment/Plan:     Donna Gross is a 55 y.o. female with a history of refractory ulcer disease s/p lap vagotomy and antrectomy with GJ and hiatal hernia repair with LOA, GERD, fibromyalgia, pancreatitis, who presents with persistent postprandial abdominal pain.     Patient is afebrile, hemodynamically normal, and without leukocytosis. No evidence of cholecystitis on imaging, and she does not exhibit an acute abdomen on exam requiring urgent surgical intervention. The etiology of her pain is unclear. We recommend further workup of her symptoms prior to proceeding with any operative management.    - no acute surgical intervention indicated  - if patient is unable to tolerate oral intake or stay hydrated, recommend admission to medicine for further workup of her abdominal pain  - recommend gastroenterology referral (or consultation if admitted) for EGD, H pylori stool antigen, and HIDA scan to evaluate for gallbladder emptying/biliary dyskinesia    Patient seen and examined by and plan developed with the attending physician, Dr. Ave Filter.      Author: Tyrone Apple. , MD 02/10/2021 12:36 PM

## 2021-02-10 NOTE — ED Provider Notes
[facility]  Emergency Department Service Report    Donna Gross 55 y.o. female , presents with Abdominal Pain      Triage   Arrived on 02/10/2021 at 7:23 AM   Arrived by Walk-in [14]    ED Triage Vitals   Temp Temp Source BP Heart Rate Resp SpO2 O2 Device Pain Score Weight   02/10/21 0725 02/10/21 0900 02/10/21 0725 02/10/21 0725 02/10/21 0725 02/10/21 0725 02/10/21 0725 02/10/21 0900 --   36.5 ???C (97.7 ???F) Oral 124/76 70 18 98 % None (Room air) Ten        Pre hospital care:       Allergies   Allergen Reactions   ??? Hydrocodone Other (See Comments)     ''burns a hole in stomach''  Tolerates hydromorphone   ??? Ibuprofen Other (See Comments)     GI discomfort    ''hurts my stomach''   ??? Morphine Itching     Tolerates hydromorphone       History   Patient is a 55 y.o. female with hx of anxiety, depression, emphysema, fibromyalgia, gastritis, GERD, and pancreatitis who presents to the ED with complaint of right sided abdominal pain x several days. Sx is constant, gradually worsening, mild with no alleviating factors. Associated nausea. Reports that she is followed by Dr. Ave Filter for possible cholecystectomy.       The history is provided by the patient. No language interpreter was used.   Abdominal Pain  The primary symptoms of the illness include abdominal pain and nausea. Episode onset: several days. The onset of the illness was gradual. The problem has not changed since onset.           Past Medical History:   Diagnosis Date   ??? Anxiety    ??? Depression    ??? Emphysema, unspecified (HCC/RAF)    ??? Fibromyalgia    ??? Gastritis    ??? GERD (gastroesophageal reflux disease)    ??? Pancreatitis         Past Surgical History:   Procedure Laterality Date   ??? ABDOMINAL SURGERY     ??? COLON SURGERY          Past Family History   family history includes Diabetes in her brother and father; Heart disease in her mother.                 Past Social History   she reports that she has been smoking cigarettes. She has been smoking about 0.25 packs per day. She uses smokeless tobacco. She reports that she does not drink alcohol, does not use drugs, and does not engage in sexual activity.     Review of Systems   Gastrointestinal: Positive for abdominal pain and nausea.   All other systems reviewed and are negative.      Physical Exam   Physical Exam  Vitals and nursing note reviewed.   Constitutional:       General: She is in acute distress (mild).      Appearance: She is well-developed.   HENT:      Head: Normocephalic and atraumatic.   Eyes:      Conjunctiva/sclera: Conjunctivae normal.   Cardiovascular:      Rate and Rhythm: Normal rate and regular rhythm.   Pulmonary:      Effort: Pulmonary effort is normal. No respiratory distress.      Breath sounds: Normal breath sounds.   Abdominal:      Palpations: Abdomen  is soft.      Tenderness: There is abdominal tenderness (mild RUQ). There is no guarding or rebound.      Comments: Prior surgical incisions     Musculoskeletal:         General: Normal range of motion.      Cervical back: Normal range of motion and neck supple.   Lymphadenopathy:      Cervical: No cervical adenopathy.   Skin:     General: Skin is warm and dry.   Neurological:      Mental Status: She is alert.      Sensory: No sensory deficit.      Comments: Moving all four extremities.   Psychiatric:         Behavior: Behavior normal.         ED Course          Laboratory Results     Labs Reviewed   BASIC METABOLIC PANEL - Abnormal; Notable for the following components:       Result Value    Total CO2 19 (*)     Glucose 130 (*)     All other components within normal limits   CBC (PERFORMABLE) - Abnormal; Notable for the following components:    Red Blood Cell Count 3.91 (*)     Hemoglobin 11.0 (*)     MCH Concentration 31.3 (*)     Red Cell Distribution Width-SD 52.1 (*)     Red Cell Distribution Width-CV 15.8 (*)     Mean Platelet Volume 8.4 (*)     All other components within normal limits   ALT (SGPT) - Normal   AST (SGOT) - Normal   BILIRUBIN,CONJ - Normal   ALKALINE PHOSPHATASE - Normal   ALBUMIN - Normal   LIPASE - Normal   AMYLASE - Normal   UA,DIPSTICK - Normal   UA,MICROSCOPIC - Normal   RAINBOW DRAW TO LABORATORY    Narrative:     The following orders were created for panel order [facility] Draw to Laboratory Clinton Gallant).  Procedure                               Abnormality         Status                     ---------                               -----------         ------                     Extra Burna Mortimer NUU[[id number]]                            Final result                 Please view results for these tests on the individual orders.   CBC & AUTO DIFFERENTIAL    Narrative:     The following orders were created for panel order CBC & Plt & Diff.  Procedure                               Abnormality  Status                     ---------                               -----------         ------                     JYN[[id number]]                          Abnormal            Final result               Differential, Automated[[id number]]                          Final result                 Please view results for these tests on the individual orders.   URINALYSIS W/REFLEX TO CULTURE    Narrative:     The following orders were created for panel order Urinalysis w/Reflex to Culture.  Procedure                               Abnormality         Status                     ---------                               -----------         ------                     UA,Dipstick[[id number]]                  Normal              Final result               UA,Microscopic[[id number]]               Normal              Final result                 Please view results for these tests on the individual orders.   EXTRA LIGHT GREEN TOP   DIFFERENTIAL, AUTOMATED (PERFORMABLE)       Imaging Results     [place] abd right upper quadrant non-vascular   Final Result by Gean Maidens., MD (02/21 1032)   IMPRESSION:      1. Similar appearance of the gallbladder with echogenic intraluminal substance, favored to represent tumefactive sludge. However, while is no Doppler color flow identified, follow-up MRCP is recommended to exclude gallbladder neoplasia. No sonographic    evidence for acute cholecystitis.      2. Mild intra and extrahepatic biliary ductal dilatation. Interval decrease in size of the common bile duct measuring 8 mm on today's exam, previously 14 mm on prior ultrasound of 01/18/2021.      Signed by: Council Mechanic   02/10/2021 10:32 AM          Administered Medications     Medication Administration  from 02/10/2021 0723 to 02/10/2021 1750       Date/Time Order Dose Route Action Action by Comments     02/10/2021 1429 sodium chloride 0.9% IV soln bolus 1,000 mL 0 mL Intravenous Stopped Rinard, Reather Converse, RN      02/10/2021 0859 sodium chloride 0.9% IV soln bolus 1,000 mL 1,000 mL Intravenous New Bag/ Syringe/ Cartridge Harrietta Guardian, RN      02/10/2021 0858 fentaNYL (PF) 100 mcg/2 mL inj 50 mcg 50 mcg IV Push Given Harrietta Guardian, RN      02/10/2021 0853 ondansetron 4 mg/2 mL inj 4 mg 4 mg Intravenous Given Harrietta Guardian, RN      02/10/2021 1103 HYDROmorphone 1 mg/mL inj 1 mg 1 mg IV Push Given Harrietta Guardian, RN      02/10/2021 1420 oxyCODONE-acetaminophen 5-325 mg tab 1 tablet 1 tablet Oral Given Rinard, Reather Converse, RN           Procedures   Procedural Sedation  Procedures    MDM  ED Course:  Nursing note reviewed.  Previous medical records were obtained and reviewed by myself. They reveal a history of anxiety, depression, emphysema, fibromyalgia, gastritis, GERD, and pancreatitis.    1610: I visited the patient to obtain history and perform the physical exam.   IV access was secured.   Orders placed for [place] abd RUQ non-vascular and laboratory analysis.   Patient will be administered IV fluid bolus, fentanyl (PF) 100 mcg/2 mL inj 50 mcg, and ondansetron 4 mg/2 mL inj 4 mg.      Laboratory Results:  UA, dipstick: unremarkable  BMP: decreased total CO2 (19), increased glucose (130), otherwise wnl  CBC: decreased RBC (3.91), decreased HGB (11.0), decreased MCHC (31.3)      Consult with surgery. Spoke to [facility] about the patient???s presenting symptoms and exam findings. They request [place] and labs. They will see the patient in the ER.      Medical Decision Making:  Kathaleya Warrick presents to the ED today with chief complaint of right sided abdominal pain.  On exam, the patient exhibits RUQ ttp and mild distress.          Clinical Impression     1. Abdominal pain, chronic, right upper quadrant    2. Gallbladder sludge        Prescriptions     Discharge Medication List as of 02/10/2021  2:25 PM          Disposition and Follow-up   Disposition: Discharge [1]    Future Appointments   Date Time Provider Department Center   02/21/2021 10:00 AM SM NM 01-NUCLEAR MEDICINE NM [facility]       Follow up with:  Kelle Darting, MD  [address]  [place] [address]  [phone]    Schedule an appointment as soon as possible for a visit   CONSIDER GI REFERRAL    [facility] Emergency Department  [address]  [employer] [place] [address]  [phone]    As needed, If symptoms worsen      Return precautions are specified on After Visit Summary.    The documentation on this chart was performed by Loleta Rose, scribed for Lelon Mast., MD    02/10/2021 10:11 AM     All scribe entries and documentation made by the scribe were entered at my direction.  I have reviewed this medical record and agree to the accuracy and completeness of the content  entered by the scribe.  The documentation recorded by the scribe accurately reflects the service I personally performed and the decisions made by me.    Lelon Mast., MD 5:50 PM 02/10/2021                 Lelon Mast., MD  02/10/21 1750

## 2021-02-10 NOTE — ED Notes
COLLECTIVE?NOTIFICATION?02/10/2021 07:23?Alethia Berthold?MRN: [id number]    [name]:   AVW:[id number]  Account [id number]  Billing Account [id number]      Criteria Met      2 Visits in 30 Days    6 Visits in 180 Days    Security and Safety  No recent Security Events currently on file    ED Care Guidelines  There are currently no ED Care Guidelines for this patient. Please check your facility's medical records system.            E.D. Visit Count (12 mo.)  Facility Visits   [facility] 3   [facility] 1   [facility] 6   [facility]   Total 14   Note: Visits indicate total known visits.     Recent Emergency Department Visit Summary  Showing 10 most recent visits out of 14 in the past 12 months  Date Facility [facility] Type Diagnoses or Chief Complaint   Feb 10, 2021 [facility] [place] [facility]     Jan 18, 2021 [facility] [place] [facility]      1. Right upper quadrant pain      2. Other specified diseases of gallbladder      3. Abdominal Pain      Jan 17, 2021 [name]  Chief Complaint: CHEST PAIN; ABD PAIN; BACK PAIN    Jan 12, 2021 [facility] [place] [facility]      1. Unspecified abdominal pain      2. Abdominal Pain      Jan 11, 2021 [facility] [place] [facility]      -1. Headache, unspecified      -1. Dizziness and giddiness      0. Unspecified convulsions      5. Calculus of gallbladder without cholecystitis without obstruction      6. Dehydration      7. Acute kidney failure, unspecified      8. Peptic ulcer, site unspecified, unspecified as acute or chronic, without hemorrhage or perforation      9. Gastritis, unspecified, without bleeding      10. Gastro-esophageal reflux disease without esophagitis      11. Chronic pain syndrome      Dec 27, 2020 [facility]. [place] [facility]      1. Abdominal Pain      1. Calculus of bile duct without cholangitis or cholecystitis without obstruction      Dec 14, 2020 [facility]. [place] Emergency      1. Unspecified abdominal pain      2. Abdominal Pain      2. Nausea with vomiting, unspecified      3. Nausea      Dec 06, 2020 [name] [place] [facility]  Chief Complaint: CHEST PAIN-ABD PAIN    Nov 23, 2020 [facility]      1. Epigastric pain      2. Other dysphagia      3. Dysphagia      Nov 19, 2020 [name] [place] [facility]  Chief Complaint: N/V/ABD PAIN/CHEST PAIN        Recent Inpatient Visit Summary  Date Facility [facility] Type Diagnoses or Chief Complaint   Dec 06, 2020 [name] [place] [facility]  Chief Complaint: CHEST PAIN-ABD PAIN        Care Team  [facility] Phone Fax Service Dates   Kelle Darting, M.D. Family Medicine   Current    Sela Hilding, M.D. Family Medicine   Current      Collective Portal  This patient has registered at the [facility] Emergency Department   For more information visit: [email or web address]   PLEASE NOTE:     1.   Any care recommendations and other clinical information are provided as guidelines or for historical purposes only, and providers should exercise their own clinical judgment when providing care.    2.   You may only use this information for purposes of treatment, payment or health care operations activities, and subject to the limitations of applicable Collective Policies.    3.   You should consult directly with the organization that provided a care guideline or other clinical history with any questions about additional information or accuracy or completeness of information provided.    ? 2022 [place], [employer]. - [email or web address]

## 2021-02-10 NOTE — ED Notes
Patient transported to [facility] 

## 2021-02-10 NOTE — ED Notes
Pt requesting food   Pt informed that she is to remain NPO until a diet is ordered by MD  Unable eo page admitting team regarding pt's diet due to no MD being assigned  Pt made aware  VSS  Denies any further needs

## 2021-02-13 ENCOUNTER — Telehealth: Payer: PRIVATE HEALTH INSURANCE

## 2021-02-13 NOTE — Telephone Encounter
Good Morning,     RE: Jahdai, Padovano [id number]     PMD ~  Dola Argyle, NP on 02/14/21 @ 10:15 am. [ TELEPHONE ]   [address] [place], [place] [address]  [phone]     Called Ms. Riecke @ [phone] spoke w/her direclry she is confimred.    [employer]   P: [phone]   F: [phone]     ---------------------------------------------------------------------------------------------------  From: Tovar, Myaha A. @mednet .[email or web address]>   Sent: Monday, February 10, 2021 2:02 PM  To: DOM [phone]-Staff @[email or web address] .[email or web address]>  Cc: [facility] ED Case Coordination @[email or web address] .[email or web address]>  Subject: [facility] EMERGENCY DEPARTMENT/[place]     MD Referral/Dr. Bernadene Bell       Patient Name: Satia, Winger    MRN: [id number]   Request: GI - patient is connected to [facility] (Dr. Ave Filter- general surgery). Would like [facility]    Insurance: [place] [employer] /[employer]    Diagnosis: Upper abdominal pain    Contact Number: [phone] or [phone]   Time Frame: one week       Thank you!       Myaha Dagmar Hait, BSN, RN  Emergency Department Case Manager  Department of Care Coordination and Clinical Social Work   [facility]  [address] [place], [place] [address]  Phone: [phone]  Pager: [phone]

## 2021-02-21 ENCOUNTER — Inpatient Hospital Stay: Payer: PRIVATE HEALTH INSURANCE

## 2021-02-21 DIAGNOSIS — K8020 Calculus of gallbladder without cholecystitis without obstruction: Secondary | ICD-10-CM

## 2021-02-21 MED ADMIN — TECHNETIUM TC 99M MEBROFENIN IV KIT: 5 | INTRAVENOUS | @ 18:00:00 | Stop: 2021-02-21 | NDC 45567045501

## 2021-02-24 ENCOUNTER — Telehealth: Payer: PRIVATE HEALTH INSURANCE | Attending: Student in an Organized Health Care Education/Training Program

## 2021-02-24 NOTE — Progress Notes
Patient Consent to Telehealth   The patient agreed to participate in the video visit prior to joining the visit.      Gastroenterology Clinic Note  ATTENDING: Mariam Dollar, MD   PATIENT: Donna Gross  MRN: [id number]  DOB: 1966/11/27  DATE OF SERVICE: 02/24/2021  REFERRING PROVIDER: Shelly Flatten., MD  PRIMARY CARE PROVIDER: Kelle Darting, MD     REASON FOR REFERRAL/CHIEF COMPLAINT:   Abdominal pain, bloating.    History of Present Illness:  Donna Gross is a 55 y.o. female with history of peptic ulcer disease s/p multiple surgeries, last in July 2020 (laparoscopic vagotomy and antrectomy, gastrojejunostomy, and hiatal hernia repair c/b ongoing nausea, vomiting, dysphagia, poor po tolerance for which follows with general surgery ???(last seen 11/24/19) who presents for same who presents for evaluation of 3 months of right upper quadrant. She reports daily abdominal pain that is worsened by eating. She reports nausea. Occasional vomiting. She moves bowel daily. She reports a foul smell to her stool. No blood in her stool. She reports bloating after eating.     Prior work up 01/2020:   RUQ [place] with GB distention, similar to 07/2019; new small amount of sludge layers at gb neck, increased CBD 1.1, prior 7mm.  MRCP was performed and showed mild biliary and pancreatic ductal dilatation, similar in appearance dating back to 2016, without a detectable cause of obstruction.    EGD (? A year ago) was performed and revealed normal esophagus, stomach anatomy consistent with Billroth II, patent anastomosis, and normal duodenum  Patient developed transaminitis, which was thought to be DILI. Methocarbamol was discontinued, seroquel held- Hep panel negative, LFTs downtrended. Patient tolerated mechanical soft diet, and her nausea and pain were well controlled.    HIDA scan (02/2021): Incomplete study:???  1. Non-visualization of the gallbladder on initial 60 minute imaging. Non-specific, with cholecystitis not excluded. 2. Patient is allergic to morphine. Since morphine augmentation could not be used the patient was advised to return for delayed imaging to check for gallbladder visualization. However the patient did not return and no delayed images could be obtained.     Last colonoscopy - possibly 2020 . Per her report possibly had a small polyp but was told can repeat in 1- years.     She has mild anemia.       Past Medical & Surgical History:  Patient Active Problem List   Diagnosis   ??? Acute chest pain   ??? Intestinal stoma prolapse (HCC/RAF)   ??? Duodenal ulcer   ??? Acute epigastric pain   ??? Peptic ulcer disease   ??? Odynophagia   ??? Intractable nausea and vomiting   ??? Dysphagia      Past Medical History:   Diagnosis Date   ??? Anxiety    ??? Depression    ??? Emphysema, unspecified (HCC/RAF)    ??? Fibromyalgia    ??? Gastritis    ??? GERD (gastroesophageal reflux disease)    ??? Pancreatitis      Past Surgical History:   Procedure Laterality Date   ??? ABDOMINAL SURGERY     ??? COLON SURGERY         Relevant Family History:    []   Family history of colorectal cancer    []   Family history of GI cancer (not CRC)   []   Family history of IBD   []   Family history of celiac disease  [x]   No relevant family history of GI illness or malignancy  Relevant Social History:    Social History     Tobacco Use   ??? Smoking status: Current Every Day Smoker     Packs/day: 0.25     Types: Cigarettes   ??? Smokeless tobacco: Current User   ??? Tobacco comment: 30 years    Substance Use Topics   ??? Alcohol use: No   ??? Drug use: No          MEDICATIONS:     Current Outpatient Medications   Medication Sig   ??? aluminum-magnesium hydroxide-simethicone 400-400-40 mg/5 mL suspension Take 10 mLs by mouth every four (4) hours as needed. (Patient taking differently: Take 10 mLs by mouth every four (4) hours as needed for Indigestion .)   ??? clobetasol 0.05% cream Apply topically three (3) times daily as needed (rash).   ??? FLUoxetine 40 mg capsule Take 40 mg by mouth daily.   ??? fluticasone-salmeterol (WIXELA INHUB) 250-50 mcg/dose diskus Inhale 1 puff two (2) times daily as needed (SOB).   ??? HYDROcodone-acetaminophen 5-325 mg tablet Take 1 tablet by mouth every six (6) hours as needed for Severe Pain (Pain Scale 7-10).   ??? ipratropium-albuterol 20-100 mcg/act inhaler Inhale 2 puffs three (3) times daily as needed (SOB).   ??? lidocaine viscous 2% oral solution Swish and spit 5 mLs every four (4) hours as needed (indigestions) .   ??? metoclopramide 10 mg tablet Take 1 tablet (10 mg total) by mouth three (3) times daily before meals.   ??? multivitamin tablet Take 1 tablet by mouth daily.   ??? Naloxone HCl 4 MG/0.1ML LIQD Call 911. Administer a single spray intranasally into one nostril for opioid overdose. May repeat in 3 minutes if patient is not breathing.Marland Kitchen   ??? ondansetron ODT 4 mg disintegrating tablet Take 1 tablet (4 mg total) by mouth every six (6) hours as needed for Nausea or Vomiting.   ??? oxyCODONE 5 mg tablet Take 1 tablet (5 mg total) by mouth every six (6) hours as needed for Moderate Pain (Pain Scale 4-6) or Severe Pain (Pain Scale 7-10). Max Daily Amount: 20 mg   ??? oxyCODONE-acetaminophen 5-325 mg tablet Take 1 tablet by mouth every six (6) hours as needed for Severe Pain (Pain Scale 7-10). Max Daily Amount: 4 tablets   ??? pantoprazole 40 mg DR tablet Take 40 mg by mouth daily .   ??? promethazine 6.25 mg/5 mL solution Take 6.25 mg by mouth every six (6) hours as needed (cough).   ??? quetiapine 100 mg tablet Take 1 tablet (100 mg total) by mouth two (2) times daily.   ??? sucralfate 1 g/10 mL suspension Take 10 mLs (1 g total) by mouth three (3) times daily with meals and at bedtime.   ??? traZODone 100 mg tablet Take 100 mg by mouth at bedtime.     No current facility-administered medications for this visit.       Allergies :   Allergies   Allergen Reactions   ??? Hydrocodone Other (See Comments)     ''burns a hole in stomach''  Tolerates hydromorphone   ??? Ibuprofen Other (See Comments)     GI discomfort    ''hurts my stomach''   ??? Morphine Itching     Tolerates hydromorphone       PHYSICAL EXAM   There were no vitals filed for this visit.   Body mass index is 25.23 kg/m???.   Gen: No acute distress, answers questions appropriately.  HEENT: Anicteric sclera  Neck:  Trachea midline, good range of motion  Pulm: No respiratory distress  Neuro: Alert and oriented  Skin: Normal appearing  Full exam not possible due to the telehealth nature of the exam.      Lab Review:  Last CBC:   Results for orders placed or performed during the hospital encounter of 02/10/21   CBC   Result Value Ref Range    White Blood Cell Count 6.19 4.16 - 9.95 x10E3/uL    Red Blood Cell Count 3.91 (L) 3.96 - 5.09 x10E6/uL    Hemoglobin 11.0 (L) 11.6 - 15.2 g/dL    Hematocrit 16.1 09.6 - 45.2 %    Mean Corpuscular Volume 89.8 79.3 - 98.6 fL    Mean Corpuscular Hemoglobin 28.1 26.4 - 33.4 pg    MCH Concentration 31.3 (L) 31.5 - 35.5 g/dL    Red Cell Distribution Width-SD 52.1 (H) 36.9 - 48.3 fL    Red Cell Distribution Width-CV 15.8 (H) 11.1 - 15.5 %    Platelet Count, Auto 367 143 - 398 x10E3/uL    Mean Platelet Volume 8.4 (L) 9.3 - 13.0 fL    Nucleated RBC%, automated 0.0 No Ref. Range %    Absolute Nucleated RBC Count 0.00 0.00 - 0.00 x10E3/uL    Neutrophil Abs (Prelim) 3.33 See Absolute Neut Ct. x10E3/uL   Differential, Automated   Result Value Ref Range    Neutrophil Percent, Auto 53.8 No Ref. Range %    Lymphocyte Percent, Auto 38.4 No Ref. Range %    Monocyte Percent, Auto 4.7 No Ref. Range %    Eosinophil Percent, Auto 2.4 No Ref. Range %    Basophil Percent, Auto 0.5 No Ref. Range %    Immature Granulocytes% 0.2 No Reference Range %    Absolute Neut Count 3.33 1.80 - 6.90 x10E3/uL    Absolute Lymphocyte Count 2.38 1.30 - 3.40 x10E3/uL    Absolute Mono Count 0.29 0.20 - 0.80 x10E3/uL    Absolute Eos Count 0.15 0.00 - 0.50 x10E3/uL    Absolute Baso Count 0.03 0.00 - 0.10 x10E3/uL    Absolute Immature Gran Count 0.01 0.00 - 0.04 x10E3/uL CBC & Plt & Diff    Narrative    The following orders were created for panel order CBC & Plt & Diff.  Procedure                               Abnormality         Status                     ---------                               -----------         ------                     EAV[[id number]]                          Abnormal            Final result               Differential, Automated[[id number]]                          Final result  Please view results for these tests on the individual orders.      Results for orders placed or performed during the hospital encounter of 01/23/20   CBC   Result Value Ref Range    White Blood Cell Count 8.36 4.16 - 9.95 x10E3/uL    Red Blood Cell Count 3.17 (L) 3.96 - 5.09 x10E6/uL    Hemoglobin 7.6 (L) 11.6 - 15.2 g/dL    Hematocrit 16.1 (L) 34.9 - 45.2 %    Mean Corpuscular Volume 82.0 79.3 - 98.6 fL    Mean Corpuscular Hemoglobin 24.0 (L) 26.4 - 33.4 pg    MCH Concentration 29.2 (L) 31.5 - 35.5 g/dL    Red Cell Distribution Width-SD 50.9 (H) 36.9 - 48.3 fL    Red Cell Distribution Width-CV 17.1 (H) 11.1 - 15.5 %    Platelet Count, Auto 376 143 - 398 x10E3/uL    Mean Platelet Volume 8.6 (L) 9.3 - 13.0 fL    Nucleated RBC%, automated 0.0 No Ref. Range %    Absolute Nucleated RBC Count 0.00 0.00 - 0.00 x10E3/uL     Last CMP:   Results for orders placed or performed during the hospital encounter of 01/23/20   Comprehensive Metabolic Panel   Result Value Ref Range    Sodium 137 135 - 146 mmol/L    Potassium 4.3 3.6 - 5.3 mmol/L    Chloride 101 96 - 106 mmol/L    Total CO2 27 20 - 30 mmol/L    Anion Gap 9 8 - 19 mmol/L    Glucose 88 65 - 99 mg/dL    Creatinine 0.96 0.45 - 1.30 mg/dL    GFR Estimate for African American 71 See GFR Additional Information mL/min/1.69m2    GFR Estimate for Non-African American 62 See GFR Additional Information mL/min/1.82m2    GFR Additional Information See Comment     Urea Nitrogen 13 7 - 22 mg/dL    Calcium 9.3 8.6 - 40.9 mg/dL    Total Protein 6.8 6.1 - 8.2 g/dL    Albumin 4.0 3.9 - 5.0 g/dL    Bilirubin,Total <8.1 0.1 - 1.2 mg/dL    Alkaline Phosphatase 114 (H) 37 - 113 U/L    Aspartate Aminotransferase 26 13 - 47 U/L    Alanine Aminotransferase 82 (H) 8 - 64 U/L       I personally reviewed these labs today.     Imaging/GI Studies:  Last Colonoscopy:  , Last EGD:   Results for orders placed or performed during the hospital encounter of 01/23/20   UPPER GASTROINTESTINAL ENDOSCOPY    Transcription    PATIENT NAME:       Vertz, Yona  DATE OF BIRTH:       Nov 09, 1966  RECORD NUMBER:      [id number]  DATE/TIME OF PROCEDURE:     01/26/2020 / 03:14:00 PM  ENDOSCOPIST:       Riccardo Dubin, MD  REFERRING PHYSICIAN:        FELLOW:           INDICATIONS FOR EXAMINATION:      Dysphagia               PROCEDURE PERFORMED:             UPPER GI ENDOSCOPY - standard EGD    MEDICATIONS:    General anesthesia    PROCEDURE TECHNIQUE:  Patient's medications, allergies, past medical, surgical, social and family histories were reviewed and updated as appropriate. A discussion  of informed consent was had with the patient and/or the patient's family prior to the procedure, including   sedation.  The alternatives, benefits and risks of  the procedure including but not limited to perforation, hemorrhage, infection, adverse drug reaction and aspiration were discussed    Procedure Details:     Informed consent was obtained for the procedure, including sedation.  Risks of perforation, hemorrhage, infection, adverse drug reaction and aspiration were discussed. The patient was placed in position.  Based on the pre-procedure assessment, including   review of the patient's medical history, medications, allergies, and review of systems, the patient had been deemed to be an appropriate candidate for conscious sedation; the patient was therefore sedated with the medications listed.  The patient was   monitored continuously with pulse oximetry, blood pressure monitoring, and direct observations.      The #[id number] (E-6) was introduced and passed without difficulty to third part of duodenum. A careful inspection was made as the endoscope was withdrawn.    Findings and interventions are described below:    TOTAL WITHDRAWL TIME:    TOTAL INSERTION TIME:      EXTENT OF EXAM:  third part of duodenum            INSTRUMENTS:   #[id number] (E-6)  TECHNICAL DIFFICULTY:  No   LIMITATIONS:      None  TOLERANCE:   Good  VISUALIZATION:  Good    FINDINGS:   Esophagus: normal mucosa, no mass, no ulcer, no esophagitis, no stricture.   Stomach: anatomy consistent with Billroth II, patent anastomosis  Duodenum: normal    ESTIMATED BLOOD LOSS:   None     DIAGNOSIS:  Esophagus: normal mucosa, no mass, no ulcer, no esophagitis, no stricture.   Stomach: anatomy consistent with Billroth II, patent anastomosis  Duodenum: normal    RECOMMENDATIONS:  Return to floor          This electronic signature authenticates all electronic and/or handwritten documentation, including orders, generated by the signer during the episode of care contained in this record.  01/27/2020 11:48:47 AM By Riccardo Dubin      , Last CT abd/pelvis w/ contrast:   Results for orders placed or performed during the hospital encounter of 01/12/21   CT abd+pelvis w contrast    Narrative    CT ABD+PELVIS W CONTRAST    CLINICAL HISTORY: r/o obstruction / hernia.    COMPARISON: Prior CTs of the abdomen and pelvis dating back to 12/10/2015 and most recently 12/14/2020    TECHNIQUE: On a multirow-detector CT scanner, a volumetric contrast enhanced scan is reconstructed to 5 mm images through the abdomen and pelvis.    CONTRAST: iodixanol (Visipaque) 320 mg/mL inj 100 mL ;    EXAM DOSAGE: The patient received the following exposure event(s) during this study, and the dose reference values for each are as shown (CTDIvol in mGy, DLP in mGy-cm). Note that the values are not patient dose but numbers generated from scan   acquisition factors based on 32 cm (L) and/or 16 cm (S) phantoms and may substantially under-estimate or over-estimate actual patient dose based on patient size and other factors. PreMonitoring, CTDI(L): 0.8, DLP: 0.8;Monitoring, CTDI(L): 3.9, DLP:   3.9;[id number]_BMI_less_than_35, CTDI(L): 9.6, DLP: 449.6    FINDINGS:    Lung bases: Atelectasis of the lung bases..  Liver: Unremarkable.  Gallbladder and bile ducts: There is similar appearance of moderate intrahepatic biliary ductal dilatation and dilatation of the common bile duct. No evidence for common  bile duct stones..  Spleen: Unremarkable.  Pancreas: Stable appearance of mild dilatation of the pancreatic duct..  Adrenals: Unremarkable.  Kidneys and ureters: Mild hydronephrosis of both kidneys is likely secondary to a distended urinary bladder..  Bowel: Status post gastrojejunostomy and hernia repair. No evidence for bowel obstruction. Normal appendix..  Bladder: Unremarkable.  Reproductive organs: Unremarkable.  Lymph nodes: Unremarkable.  Peritoneum: Unremarkable.  Vessels: Unremarkable.  Abdominal wall: Atherosclerotic calcifications of the abdominal aorta without evidence for aneurysm.Marland Kitchen  Bones: Multilevel degenerative changes of the lumbar spine of the spine..      Impression    IMPRESSION:      1. Postsurgical changes of gastrojejunostomy without evidence for bowel obstruction.    2. Stable appearance of moderate intrahepatic biliary ductal dilatation and dilatation of the common bile duct since 2016.          Signed by: Burman Nieves   01/12/2021 9:38 AM    and Last abdominal ultrasound:   Results for orders placed or performed during the hospital encounter of 01/18/21   [place] abd complete    Narrative    [place] ABD COMPLETE     CLINICAL HISTORY: RUQ pain.    COMPARISON: CT abdomen/pelvis dated 01/12/2021.    TECHNIQUE: Real time grayscale and color Doppler images of the abdominal organs were obtained using a curved transducer.    FINDINGS:    Pancreas: Partially visualized. Dilated main pancreatic duct measuring 3 mm, present since at least 2020.    Liver: Normal in size and homogeneous in echogenicity.     Focal liver lesions: None.  1  Portal vein: Normal, hepatopetal flow.    Gallbladder: Suspected tumefactive sludge in the gallbladder. Per ultrasound technician this hyperechoic masslike area is mobile and there is no color Doppler flow present in the suspected sludge. No distension, no gallbladder wall thickening, or   sonographic [name]'s sign.    Bile ducts: Moderate intra- and extrahepatic biliary ductal dilation, present since 2016. Common bile duct enlarged to 1.4 cm, previously 1.1 cm on January 23, 2020.    Kidneys: Normal in size with normal cortical thickness and echogenicity. No hydronephrosis.    Spleen: Normal in size.    Ascites: None in the upper abdomen.    Visualized proximal aorta and IVC: Unremarkable.    MEASUREMENTS:  Liver: 12.6 cm   Common Duct: 1.4 cm  Right kidney: 10.1 cm   Left Kidney: 10.1 cm  Spleen: 6.0 cm   Aorta: 1.8 cm       Impression    IMPRESSION:    1.  Findings are favored to represent tumefactive sludge in the gallbladder. No evidence of acute cholecystitis. However, while there is no color Doppler flow evident within the suspected sludge, would recommend follow-up MRCP to exclude possible   neoplastic process.  2.  Mild intra- and extrahepatic biliary ductal dilation, present since 2016. Common bile duct enlarged to 1.4 cm, previously 1.1 cm on January 23, 2020.  3.  Dilated main pancreatic duct measuring 3 mm, present since at least 2020.    I, Joetta Manners, M.D., have reviewed this radiological study personally and I am in full agreement with the findings of the report presented here.    Dictated by: Kelton Pillar   01/18/2021 4:32 PM    Signed by: Joetta Manners   01/18/2021 4:48 PM       [x]  I independently reviewed and interpreted  Imaging/colonoscopy report.   []  No relevant prior imaging/colonoscopy.  VISIT DIAGNOSES/PROBLEMS ADDRESSED ON 02/24/2021: Encounter Diagnosis   Name Primary?   ??? Right upper quadrant abdominal pain Yes          Medical Decision Making addressed on 02/24/2021:      Number and Complexity of Problems Addressed at the Encounter:   Level 4:   [x]   1 or more chronic illness with exacerbation, progression, or side effects of treatment  []   2 or more stable chronic illness  [x]   1 undiagnosed new problem with uncertain diagnosis  []   1 acute illness with systemic symptoms  []   1 acute complicated injury   Level 5:   []   1 or more chronic illness with severe exacerbation, progression, or side effects of treatment  []   1 acute or chronic illness or injury that poses a threat to life or bodily function     Review of Data: I have (select 1 out of 3 categories for level 4; 2 out of 3 categories for level 5)  []  Reviewed/ordered []  1 []  2 [x]  ? 3 unique laboratory, radiology, and/or diagnostic tests noted below   []  I have reviewed tests, documents or independent historian(s):  [x]  Reviewed/ordered ? 3 unique laboratory, radiology, and/or diagnostic tests noted    []  Reviewed prior external notes and incorporated into patient assessment as noted  []  I have independently interpreted test performed by other physician(s) as noted   []  Discussed management or test interpretation with external provider(s) as noted       Risk of Complication and or Morbidity or Mortality of Patient Management including Social Determinants of Health:   [x]   I deem the above diagnoses to have a risk of complication, morbidity or mortality of []  Minimal   []  Low (3)     [x]  Moderate (4)   []  Severe (5)  []  The diagnosis or treatment of said conditions is significantly limited by social determinants of health as noted.     Assessment & Plan :   Sharea Guinther is a 55 y.o. female with history of peptic ulcer disease s/p multiple surgeries, last in July 2020 (laparoscopic vagotomy and antrectomy, gastrojejunostomy, and hiatal hernia repair c/b ongoing nausea, vomiting, dysphagia, poor po tolerance for which follows with general surgery ???(last seen 11/24/19) who presents for same who presents for evaluation of 3 months of right upper quadrant. She reports daily abdominal pain that is worsened by eating. She reports nausea. Occasional vomiting. She moves bowel daily. She reports a foul smell to her stool. No blood in her stool. She reports bloating after eating. DDX for her symptomatology is broad and includes anastomotic ulcers given prior surgery and PUD, biliary colic given post-prandial nature, musculoskeletal, functional, etc. Unfortunately, exam is very limited given telehealth nature of the visit. She is followed by surgery closely. She previously had a negative endoscopy a year ago but her symptoms are more recent.     - EGD for further evaluation of anemia and abdominal pain  - Check stool H pylori  - Get record of colonoscopy  - Repeat HIDA scan  - Follow up with surgery  - Next visit for in person visit     The above plan/recommendation(s) were discussed with the patient. The patient had all questions answered satisfactorily and is in agreement with this recommended plan of care.    Thank you for involving [place] in the care of this patient.  Please do not hesitate to contact us if you have any questions or concerns.  Sincerely,      Mariam Dollar, MD  02/24/2021 5:38 PM

## 2021-02-25 ENCOUNTER — Ambulatory Visit: Payer: PRIVATE HEALTH INSURANCE

## 2021-03-02 ENCOUNTER — Inpatient Hospital Stay
Admit: 2021-03-02 | Discharge: 2021-03-02 | Disposition: A | Discharge status: Home / Self Care | Payer: PRIVATE HEALTH INSURANCE | Source: Home / Self Care

## 2021-03-02 ENCOUNTER — Ambulatory Visit: Payer: PRIVATE HEALTH INSURANCE

## 2021-03-02 DIAGNOSIS — R1013 Epigastric pain: Secondary | ICD-10-CM

## 2021-03-02 LAB — Basic Metabolic Panel: CREATININE: 1.11 mg/dL (ref 0.60–1.30)

## 2021-03-02 LAB — Alanine Aminotransferase: ALANINE AMINOTRANSFERASE: 24 U/L (ref 8–70)

## 2021-03-02 LAB — Differential Automated: EOSINOPHIL PERCENT, AUTO: 2.5 (ref 1.30–3.40)

## 2021-03-02 LAB — Alkaline Phosphatase: ALKALINE PHOSPHATASE: 99 U/L (ref 37–113)

## 2021-03-02 LAB — Aspartate Aminotransferase: ASPARTATE AMINOTRANSFERASE: 30 U/L (ref 13–62)

## 2021-03-02 LAB — CBC: ABSOLUTE NUCLEATED RBC COUNT: 0 10*3/uL (ref 0.00–0.00)

## 2021-03-02 LAB — Lipase: LIPASE: 51 U/L (ref 13–69)

## 2021-03-02 LAB — Albumin: ALBUMIN: 4.2 g/dL (ref 3.9–5.0)

## 2021-03-02 LAB — Amylase: AMYLASE: 127 U/L — ABNORMAL HIGH (ref 31–124)

## 2021-03-02 LAB — Bilirubin,Conjugated: BILIRUBIN,CONJUGATED: <0.2 mg/dL (ref <=0.3)

## 2021-03-02 MED ADMIN — SODIUM CHLORIDE 0.9 % IV BOLUS: 1000 mL | INTRAVENOUS | @ 16:00:00 | Stop: 2021-03-02

## 2021-03-02 MED ADMIN — HYDROMORPHONE HCL 1 MG/ML IJ SOLN: 1 mg | INTRAMUSCULAR | @ 17:00:00 | Stop: 2021-03-02

## 2021-03-02 NOTE — ED Notes
Pt left unit ambulatory with steady gait, states she plans to have a friend pick her up and drive her to a different hospital for pain management, Pt states her pain has improved to 5/10 at a tolerable level for now but she knows from experience that it will wear off quickly. Pt remains agitated and upset that she was not provided a prescription for pain medicine. Pt encouraged to seek immediate followup with her PMD and surgeon for continuous pain management as needed.

## 2021-03-02 NOTE — ED Notes
Dr. Dyane Dustman at bedside speaking with pt regarding results and plan for discharge home. Pt upset and agitated regarding no pain medication while in ED and no prescriptions provided at discharge. Pt states she used to have percocet at home but was only given a small amount and is already out. Pt requesting refill, Dr. Dyane Dustman recommending follow up with outpatient doctors including Dr. Ave Filter (surgeon) whom she has been seeing for ongoing gallbladder issues

## 2021-03-02 NOTE — ED Notes
Per lab, green top (chem panel) tube hemolyzed and unable to be resulted. Will send phlebotomist for assistance with redraw. MD aware

## 2021-03-02 NOTE — ED Notes
Pt returned from [place], lab notified of need for phlebotomist for lab draw

## 2021-03-02 NOTE — ED Notes
Pt tearful, verbalizing frustration around pain not being treated since arrival to ED. States she has no medication to take at home for pain because her doctors won't prescribe anything more. Multiple meds seen on med list filled end of February. Refusing lab draw ''until someone gives her medication''. Plan of care discussed, reassurance provided and assured pt that I would remind Dr. Dyane Dustman about her reported pain but it would be appreciated if she cooperated with lab draw at this time for complete assessment. Pt agreed to cooperate, MD updated.

## 2021-03-02 NOTE — ED Notes
Pt ambulatory to bathroom with steady gait, requesting to speak with MD again prior to discharge, requesting prescription for pain medication  - Dr. Dyane Dustman aware

## 2021-03-02 NOTE — ED Provider Notes
[facility]  Emergency Department Service Report    Donna Gross 55 y.o. female , presents with Abdominal Pain      Triage   Arrived on 03/02/2021 at 7:15 AM   Arrived by Car [5]    ED Triage Vitals   Temp Temp Source BP Heart Rate Resp SpO2 O2 Device Pain Score Weight   03/02/21 0719 03/02/21 0719 03/02/21 0719 03/02/21 0719 03/02/21 0719 03/02/21 0719 03/02/21 0719 03/02/21 0730 --   36.4 ???C (97.5 ???F) Oral 128/74 58 18 99 % None (Room air) Eight        Pre hospital care:       Allergies   Allergen Reactions   ??? Hydrocodone Other (See Comments)     ''burns a hole in stomach''  Tolerates hydromorphone   ??? Ibuprofen Other (See Comments)     GI discomfort    ''hurts my stomach''   ??? Morphine Itching     Tolerates hydromorphone       History   The history is provided by the patient.   Abdominal Pain  The primary symptoms of the illness include abdominal pain. The current episode started 6 to 12 hours ago. The onset of the illness was gradual. The problem has not changed since onset.  The abdominal pain began 6 to 12 hours ago. The pain came on gradually. The abdominal pain is located in the epigastric region and RUQ. The abdominal pain radiates to the back.   The illness is associated with eating. The patient states that she believes she is currently not pregnant. The patient has not had a change in bowel habit. Symptoms associated with the illness do not include chills. Significant associated medical issues include PUD, GERD and gallstones.            Past Medical History:   Diagnosis Date   ??? Anxiety    ??? Depression    ??? Emphysema, unspecified (HCC/RAF)    ??? Fibromyalgia    ??? Gastritis    ??? GERD (gastroesophageal reflux disease)    ??? Pancreatitis         Past Surgical History:   Procedure Laterality Date   ??? ABDOMINAL SURGERY     ??? COLON SURGERY          Past Family History   family history includes Diabetes in her brother and father; Heart disease in her mother.                 Past Social History she reports that she has been smoking cigarettes. She has been smoking about 0.25 packs per day. She uses smokeless tobacco. She reports that she does not drink alcohol, does not use drugs, and does not engage in sexual activity.     Review of Systems   Constitutional: Negative for chills.   Gastrointestinal: Positive for abdominal pain.   All other systems reviewed and are negative.      Physical Exam   Physical Exam  Vitals and nursing note reviewed.   Constitutional:       Appearance: Normal appearance. She is normal weight.   HENT:      Head: Normocephalic and atraumatic.      Right Ear: Tympanic membrane, ear canal and external ear normal.      Left Ear: Tympanic membrane, ear canal and external ear normal.      Nose: Nose normal.      Mouth/Throat:      Mouth: Mucous membranes  are moist.      Pharynx: Oropharynx is clear.   Eyes:      Extraocular Movements: Extraocular movements intact.      Conjunctiva/sclera: Conjunctivae normal.      Pupils: Pupils are equal, round, and reactive to light.   Cardiovascular:      Rate and Rhythm: Normal rate and regular rhythm.      Pulses: Normal pulses.      Heart sounds: Normal heart sounds.   Pulmonary:      Effort: Pulmonary effort is normal.      Breath sounds: Normal breath sounds.   Abdominal:      General: Abdomen is flat. Bowel sounds are normal.      Palpations: Abdomen is soft.      Tenderness: There is abdominal tenderness.   Musculoskeletal:         General: Normal range of motion.      Cervical back: Normal range of motion and neck supple.   Skin:     General: Skin is warm.      Capillary Refill: Capillary refill takes less than 2 seconds.   Neurological:      General: No focal deficit present.      Mental Status: She is alert and oriented to person, place, and time. Mental status is at baseline.   Psychiatric:         Mood and Affect: Mood normal.         Behavior: Behavior normal.         Thought Content: Thought content normal.         Judgment: Judgment normal.         ED Course          Laboratory Results     Labs Reviewed   AMYLASE - Abnormal; Notable for the following components:       Result Value    Amylase 127 (*)     All other components within normal limits   CBC (PERFORMABLE) - Abnormal; Notable for the following components:    Hemoglobin 11.2 (*)     MCH Concentration 30.2 (*)     Red Cell Distribution Width-SD 52.4 (*)     Red Cell Distribution Width-CV 15.6 (*)     Platelet Count, Auto 402 (*)     Mean Platelet Volume 8.6 (*)     All other components within normal limits   ALT (SGPT) - Normal   AST (SGOT) - Normal   BILIRUBIN,CONJ - Normal   ALKALINE PHOSPHATASE - Normal   ALBUMIN - Normal   LIPASE - Normal   CBC & AUTO DIFFERENTIAL    Narrative:     The following orders were created for panel order CBC & Plt & Diff.  Procedure                               Abnormality         Status                     ---------                               -----------         ------                     NWG[[id number]]  Abnormal            Final result               Differential, Automated[[id number]]                          Final result                 Please view results for these tests on the individual orders.   BASIC METABOLIC PANEL   DIFFERENTIAL, AUTOMATED (PERFORMABLE)       Imaging Results     [place] abd right upper quadrant non-vascular   Final Result by Syliva Overman., MD (03/13 0900)   IMPRESSION:   Cholelithiasis. The common bile duct is dilated and there is mild intrahepatic ductal dilatation which may reflect a more distal obstructing stone.                  Signed by: Celso Amy   03/02/2021 9:00 AM          Administered Medications     Medication Administration from 03/02/2021 0715 to 03/03/2021 1445       Date/Time Order Dose Route Action Action by Comments     03/02/2021 0749 sodium chloride 0.9% IV soln bolus 1,000 mL 1,000 mL Intravenous Not Given Reesing, Sharion Settler, RN MD aware, no IV acesss     03/02/2021 1020 HYDROmorphone 1 mg/mL inj 1 mg 1 mg Intramuscular Not Given Reesing, Sharion Settler, RN documented on override pull. pt medicated with 1mg  IM dilaudid at 1020     03/02/2021 1020 HYDROmorphone 2 mg/mL inj 1 mg  Given Reesing, Sharion Settler, RN           Procedures   Procedural Sedation  Procedures    MDM  Data Reviewed/Counseling: I have reviewed the patient's vital signs and nursing notes. I had a detailed discussion with the patient regarding the historical points, exam findings, and any diagnostic results supporting the discharge diagnosis. I also discussed lab results, radiology results, the need for outpatient follow-up and the need to return to the ED if symptoms worsen or if there are any questions or concerns that arise at home.    I ordered a CBC on this patient to assess for acute anemia and to determine the WBC as possible evidence of acute bacterial infection.   I ordered a basic metabolic panel on this patient to assess for hyperglycemia, acute renal failure, e/o dehydration, and to assess for possible other metabolic abnormalities such as hyponatremia, hyperkalemia, amongst others.   I ordered a hepatic panel and a lipase to assess for acute hepatitis, pancreatitis, biliary colic, biliary obstruction, amongst others. RUQ [place] ordered and shows e/o gallstones, no e/o acute cholecystitis; normal LFTs noted; of note, patient with multiple ED visits for the same; given dilaudid for pain control; Patient is otherwise well appearing; feel it is reasonable to discharge the patient home at this time with close outpatient follow up given and strict return precautions provided.       Clinical Impression     1. Abdominal pain, acute, epigastric        Prescriptions     Discharge Medication List as of 03/02/2021 10:06 AM          Disposition and Follow-up   Disposition: Discharge [1]    No future appointments.    Follow up with:  Kelle Darting, MD  [address]  [place]  [place] [address]  [phone]    In 1 week        Return precautions are specified on After Visit Summary.                 Pablo Ledger, MD  03/03/21 1445

## 2021-03-02 NOTE — ED Notes
IV and lab draw attempt unsuccessful, Tiffany RN at bedside to assist. Pt states she is a difficult lab draw. Warm packs applied. MD aware

## 2021-03-02 NOTE — ED Notes
COLLECTIVE?NOTIFICATION?03/02/2021 07:15?Donna Gross?MRN: [id number]    [name]:   WJX:?[id number]  Account [id number]  Billing Account [id number]      Criteria Met      2 Visits in 30 Days    6 Visits in 180 Days    Security and Safety  No recent Security Events currently on file    ED Care Guidelines  There are currently no ED Care Guidelines for this patient. Please check your facility's medical records system.            E.D. Visit Count (12 mo.)  Facility Visits   [facility] 2   [facility] [place] 1   [facility] [place] 7   [address] [place] 4   Total 14   Note: Visits indicate total known visits.     Recent Emergency Department Visit Summary  Showing 10 most recent visits out of 14 in the past 12 months  Date Facility [facility] Type Diagnoses or Chief Complaint   Mar 02, 2021 [facility] [place] [facility]     Feb 10, 2021 [facility] [place] [facility]      1. Abdominal Pain      1. Right upper quadrant pain      2. Other chronic pain      Jan 18, 2021 [facility] [place] [facility]      1. Right upper quadrant pain      2. Other specified diseases of gallbladder      3. Abdominal Pain      Jan 17, 2021 [name] [place] [facility]  Chief Complaint: CHEST PAIN; ABD PAIN; BACK PAIN    Jan 12, 2021 [facility] [place] [facility]      1. Unspecified abdominal pain      2. Abdominal Pain      Jan 11, 2021 [facility] [place] [facility]      -1. Headache, unspecified      -1. Dizziness and giddiness      0. Unspecified convulsions      5. Calculus of gallbladder without cholecystitis without obstruction      6. Dehydration      7. Acute kidney failure, unspecified      8. Peptic ulcer, site unspecified, unspecified as acute or chronic, without hemorrhage or perforation      9. Gastritis, unspecified, without bleeding      10. Gastro-esophageal reflux disease without esophagitis      11. Chronic pain syndrome      Dec 27, 2020 [name] [place] [facility]      1. Abdominal Pain      1. Calculus of bile duct without cholangitis or cholecystitis without obstruction      Dec 14, 2020 [facility]      1. Unspecified abdominal pain      2. Abdominal Pain      2. Nausea with vomiting, unspecified      3. Nausea      Dec 06, 2020 [name] [place] [facility]  Chief Complaint: CHEST PAIN-ABD PAIN    Nov 23, 2020 [facility]      1. Epigastric pain      2. Other dysphagia      3. Dysphagia          Recent  Inpatient Visit Summary  Date Facility [facility] Type Diagnoses or Chief Complaint   Dec 06, 2020 [name] [facility]  Chief Complaint: CHEST PAIN-ABD PAIN        Care Team  [facility] Specialty Phone Fax Service Dates   Kelle Darting, M.D. Family Medicine   Current    Sela Hilding, M.D. Family Medicine   Current      Collective Portal  This patient has registered at the [facility]   For more information visit: [email or web address]   PLEASE NOTE:     1.   Any care recommendations and other clinical information are provided as guidelines or for historical purposes only, and providers should exercise their own clinical judgment when providing care.    2.   You may only use this information for purposes of treatment, payment or health care operations activities, and subject to the limitations of applicable Collective Policies.    3.   You should consult directly with the organization that provided a care guideline or other clinical history with any questions about additional information or accuracy or completeness of information provided.    ? 2022 [place], [employer]. - [email or web address]

## 2021-03-04 ENCOUNTER — Ambulatory Visit: Payer: PRIVATE HEALTH INSURANCE

## 2021-03-10 IMAGING — CT CT CHEST HIGH RESOLUTION
2 of 6 series · 7 of 36 positions shown, 9 images · non-contrast
Comparison: none

HIGH-RESOLUTION CT OF THE CHEST WITHOUT CONTRAST
CLINICAL INDICATION:  Sjogren syndrome with keratoconjunctivitis
Shortness of breath
Cough, unspecified
REFERENCE:  None
TECHNIQUE: Thin collimation noncontrast imaging was obtained through the chest during supine and prone positioning.

[Series 2: 1mm x 10mm · axial · 0.46mm/px · z∈[-264,-34]mm · 6 of 31 slices shown, 8 images]
[im 4/31  mediastinal]
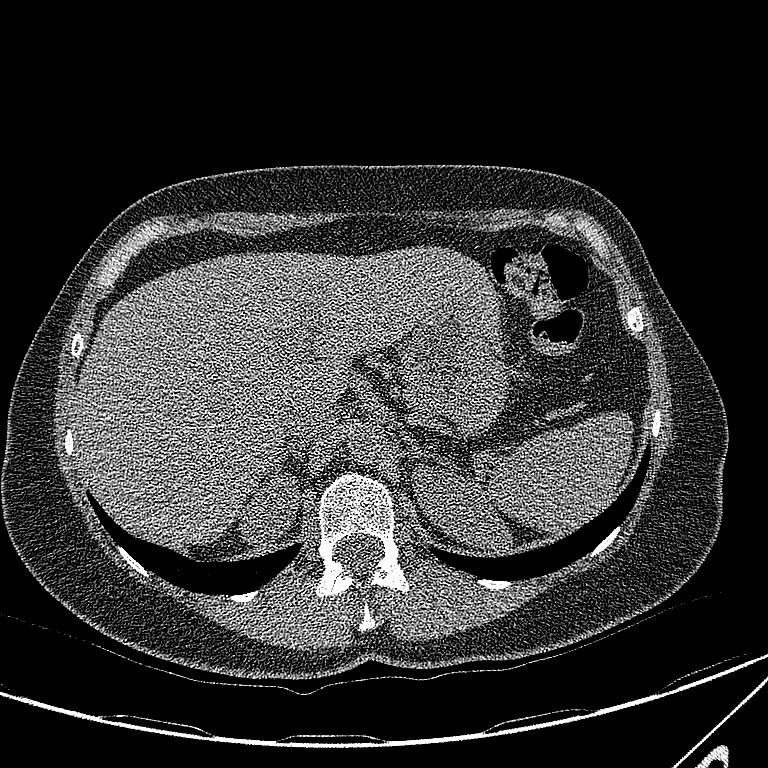
[im 4/31  lung]
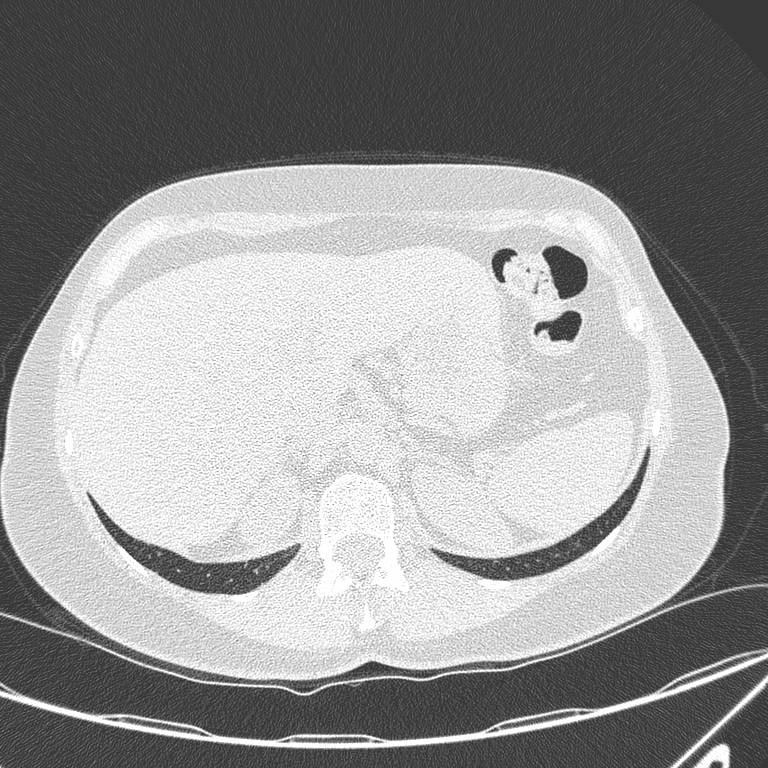
[im 9/31  lung]
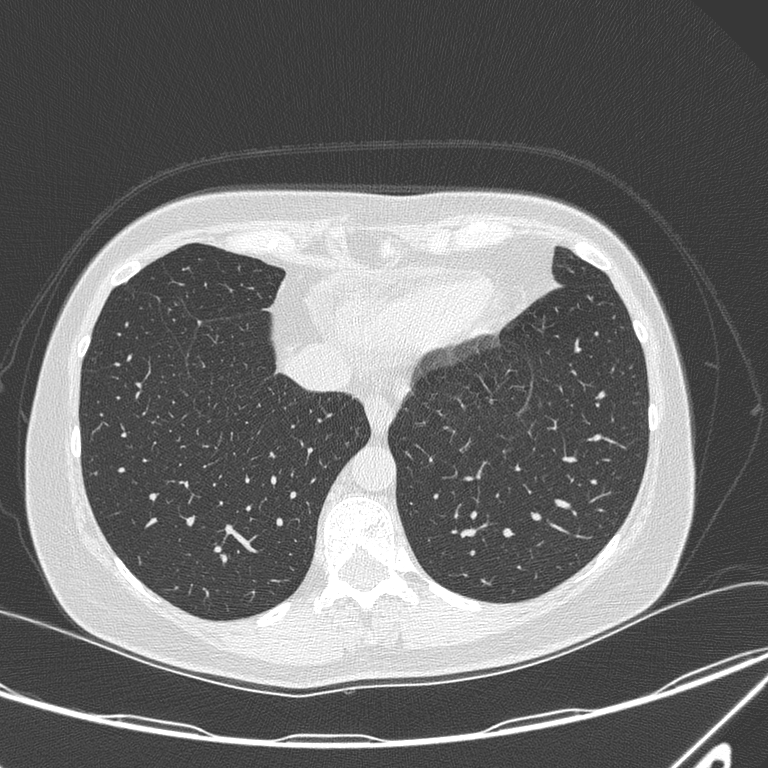
[im 13/31  lung]
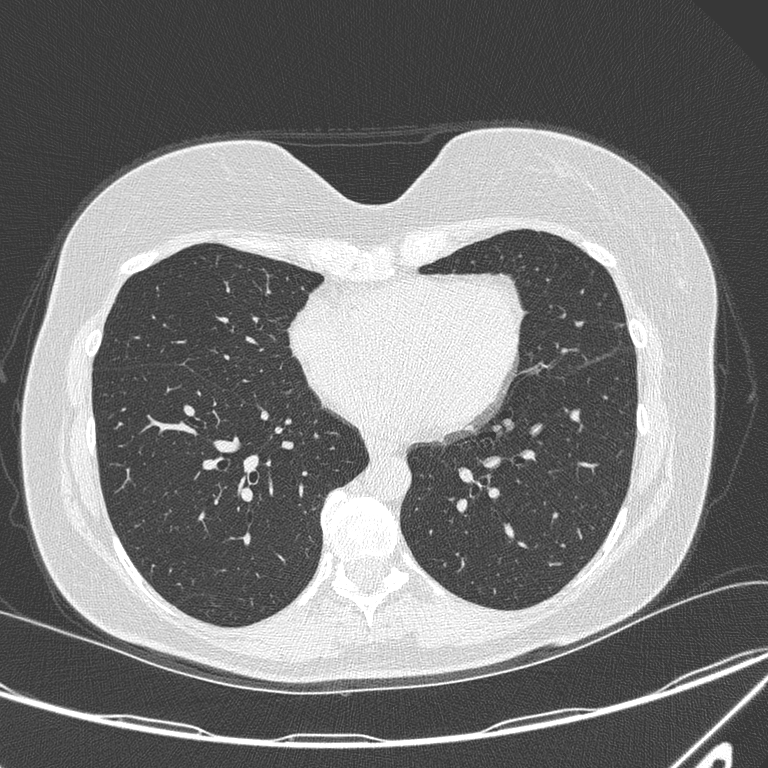
[im 18/31  lung]
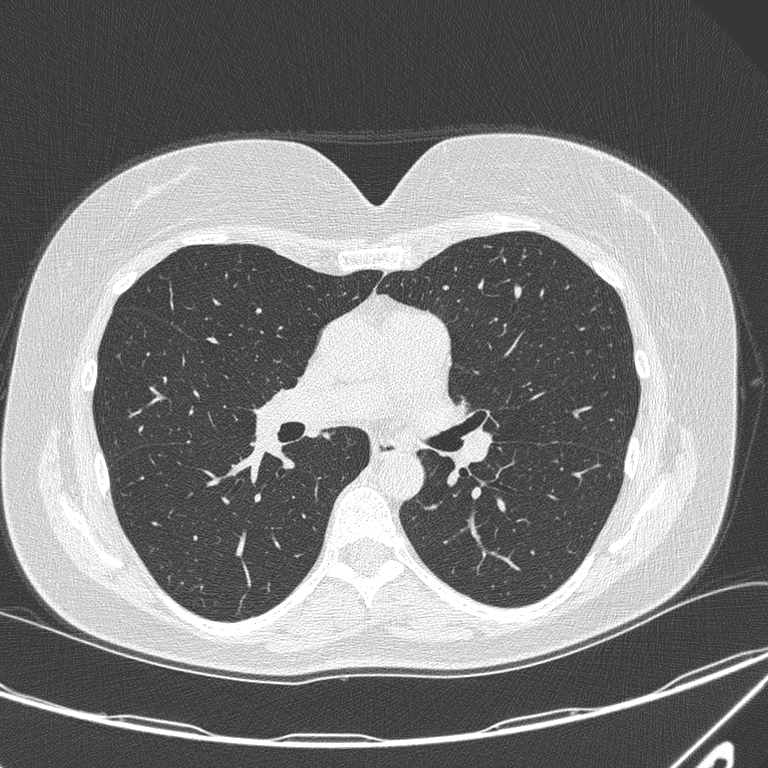
[im 23/31  mediastinal]
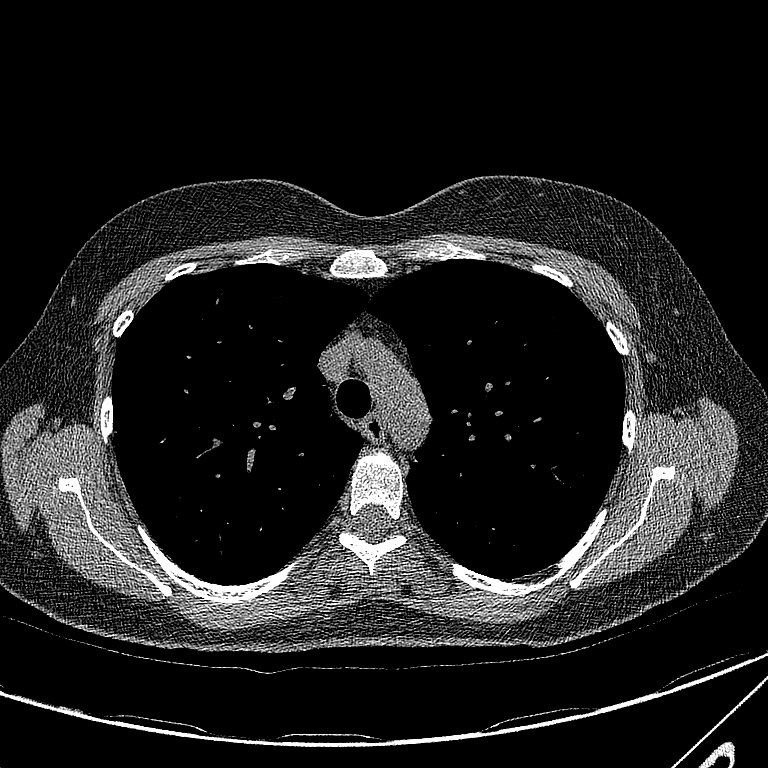
[im 23/31  lung]
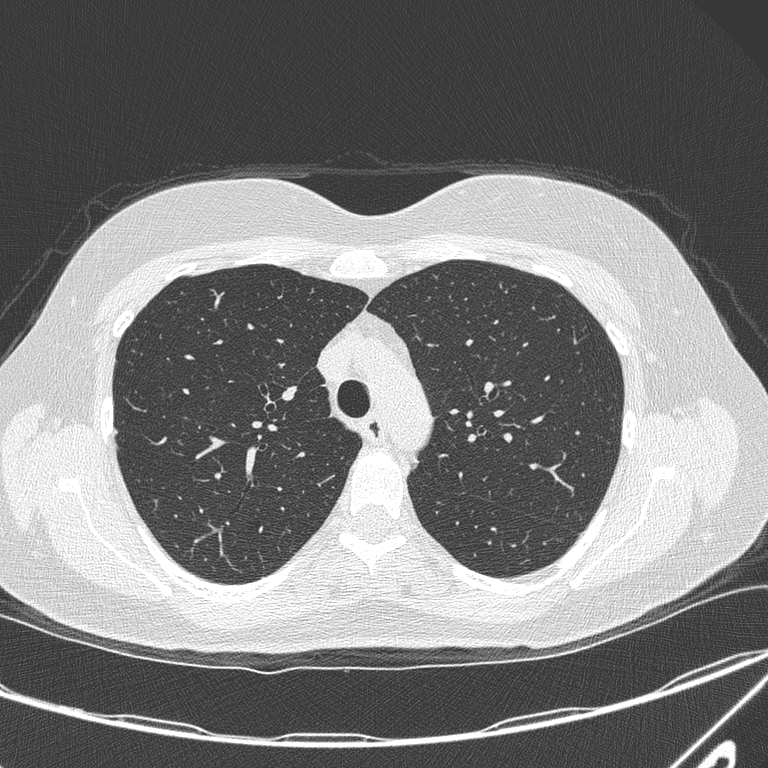
[im 27/31  lung]
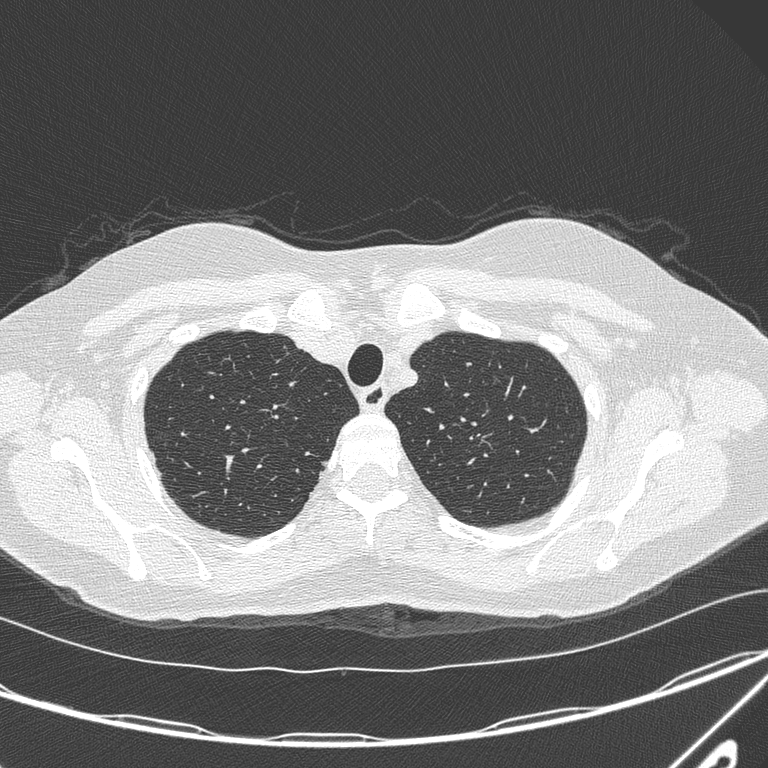

[mpr, coronal, coronal · coronal · 0.46mm/px · 1 of 111 slices shown]
[im 56/111  lung]
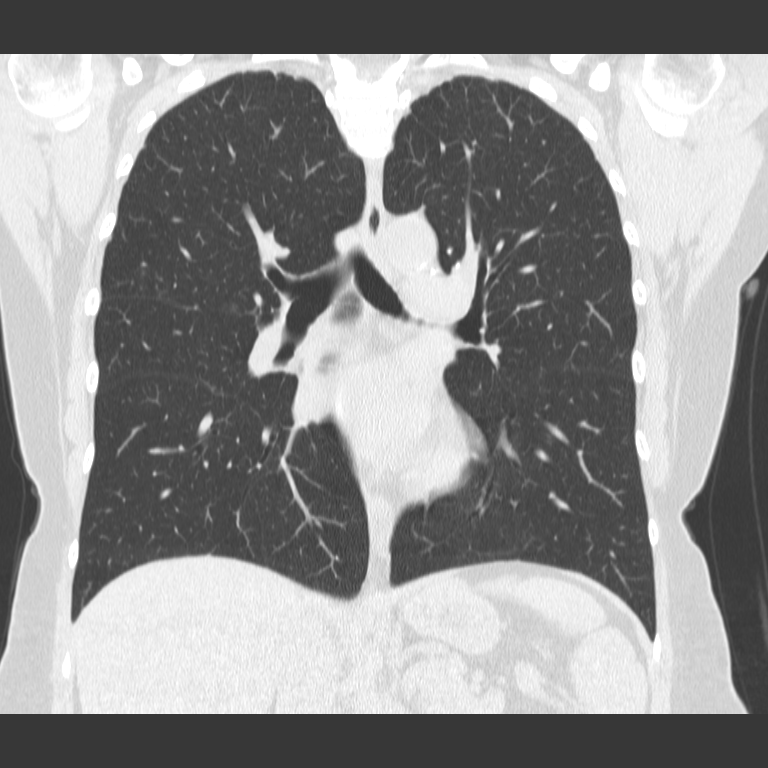

[7 of 36 positions shown; findings below may reference images not displayed]

FINDINGS: THORACIC INLET: Within normal limits.
HEART AND MEDIASTINUM: The heart is normal in size. Small pericardial effusion. Normal caliber great vessels. No pathologically enlarged mediastinal or hilar lymph nodes are detected.
AIRWAY: The tracheobronchial tree is well aerated throughout.
LUNGS:  The lungs demonstrate: 1.1 cm irregular nodular density within the lingula on a single transaxial image and poorly visualized on reformatted images.. There is pleural thickening of the fissure on the left. 2 mm nodule right lung base on image 21/31 No significant interstitial lung disease. No effusions or infiltrates.
UPPER ABDOMEN: Limited imaging of the upper abdomen demonstrates:
no acute abnormalities. Evaluation of the upper slightly limited by suboptimal resolution.
OSSEOUS STRUCTURES: Intact bony structures and surrounding soft tissue structures.  No soft tissue mass or axillary adenopathy is present.
IMPRESSION: No significant interstitial lung disease.
11 mm nodular density within the lingula could be due to breathing motion and misregistration. Standard CT scan of thorax with contrast is advised for further evaluation.
All CT scans at this facility use dose modulation, iterative reconstruction, and/or weight-based dosing when appropriate to reduce radiation dose to as low as reasonably achievable.
LOCATION CODE: 1
Is the patient pregnant?
No

## 2021-03-11 ENCOUNTER — Ambulatory Visit: Payer: PRIVATE HEALTH INSURANCE

## 2021-03-11 ENCOUNTER — Telehealth: Payer: PRIVATE HEALTH INSURANCE

## 2021-03-11 DIAGNOSIS — Z01812 Encounter for preprocedural laboratory examination: Secondary | ICD-10-CM

## 2021-03-11 NOTE — Telephone Encounter
MPU Procedure Checklist:     _0   COVID Order has been pended and sent to MD performing procedure to sign.     _1  Open access patient- Pharmacy on file has been confirmed and bowel prep medication has been pended.    Prep instructions have been delivered to patient via:  _2  Email to __________  _3  Fax to ____________  _4  Mail to Address on file  _5  Sent via letter on Mychart  _6  Preps provided by the office      Encounter sent to FCU team if the following applies:  _7  Procedure scheduled on: 03/18/21  _8  Changes have been made to current procedure scheduled.       MAC Script    Patient has been advised of method of anesthesia that will be used in this procedure.  If MAC used, MAC script has been advised.  If IVCS used, pt given option of MAC.     _9  Pt has been offered all options and is requesting to be scheduled with MAC. Patient will pay $200 if authorization is denied.    _10  Pt has been scheduled in [place] on sedation day (if insurance applicable).      _11  Patient has requested to be scheduled with conscious sedation in one of our community Medical procedure units.

## 2021-03-11 NOTE — Telephone Encounter
Good morning,    Pt has an EGD procedure scheduling on 03/18/21. Please send for authorization. Thank you.

## 2021-03-11 NOTE — Telephone Encounter
Pt requested to be scheduled for EGD.  Called MPU, scheduled on 03/18/21 at 9am, with dr. Lucia Estelle.    Called pt back, LVM informing of EGD schedule. Will send mychart msg with instructions.

## 2021-03-12 ENCOUNTER — Ambulatory Visit: Payer: PRIVATE HEALTH INSURANCE

## 2021-03-16 ENCOUNTER — Institutional Professional Consult (permissible substitution): Payer: PRIVATE HEALTH INSURANCE

## 2021-03-16 DIAGNOSIS — Z01812 Encounter for preprocedural laboratory examination: Secondary | ICD-10-CM

## 2021-03-17 LAB — COVID-19 PCR

## 2021-03-17 NOTE — Telephone Encounter
Reply by: Rowland Lathe  Auth request was submitted and approved

## 2021-03-18 ENCOUNTER — Inpatient Hospital Stay
Admit: 2021-03-18 | Discharge: 2021-03-18 | Disposition: A | Discharge status: Home / Self Care | Payer: PRIVATE HEALTH INSURANCE | Source: Home / Self Care | Attending: Gastroenterology

## 2021-03-18 DIAGNOSIS — R1013 Epigastric pain: Secondary | ICD-10-CM

## 2021-03-18 DIAGNOSIS — R1011 Right upper quadrant pain: Secondary | ICD-10-CM

## 2021-03-18 DIAGNOSIS — R112 Nausea with vomiting, unspecified: Secondary | ICD-10-CM

## 2021-03-18 MED ORDER — SUCRALFATE 1 GM/10ML PO SUSP
1 g | Freq: Three times a day (TID) | ORAL | 3 refills | Status: AC
Start: 2021-03-18 — End: ?

## 2021-03-18 MED ADMIN — PROPOFOL 200 MG/20ML IV EMUL (ANES): INTRAVENOUS | @ 17:00:00 | Stop: 2021-03-18

## 2021-03-18 MED ADMIN — LIDOCAINE HCL (PF) 1 % IJ SOLN: INTRAVENOUS | @ 17:00:00 | Stop: 2021-03-18 | NDC 63323049257

## 2021-03-18 MED ADMIN — PROPOFOL 200 MG/20ML IV EMUL: INTRAVENOUS | @ 17:00:00 | Stop: 2021-03-18 | NDC 63323026929

## 2021-03-18 MED ADMIN — SODIUM CHLORIDE 0.9 % IV SOLN: 10 mL/h | INTRAVENOUS | @ 17:00:00 | Stop: 2021-03-18 | NDC 00338004904

## 2021-03-18 NOTE — H&P
Out-Patient Gastroenterology Pre-Procedure Note      PATIENT: Donna Gross  MRN: [id number]  DOB: September 02, 1966  DATE OF SERVICE: 03/18/2021    PRIMARY CARE PROVIDER: Kelle Darting, MD      History of Present Illness/Chief Complaint:  Donna Gross is a 55 y.o. female who presents for egd for RUQ abd pain, hx of vagotomy/antrectomy/gastrojejunostomy for refractory PUD and HH repair c/b N/V, poor PO tolerance. Follows with GI Dr. Ladene Artist.       Review of Systems:  A complete ROS was performed. Pertinent positives and negatives are in the HPI.  Remainder of 14 systems is negative.    Past Medical History:    Past Medical History:   Diagnosis Date   ??? Anxiety    ??? Depression    ??? Emphysema, unspecified (HCC/RAF)    ??? Emphysema, unspecified (HCC/RAF)    ??? Fibromyalgia    ??? Gastritis    ??? GERD (gastroesophageal reflux disease)    ??? Pancreatitis        Past Surgical History:    Past Surgical History:   Procedure Laterality Date   ??? ABDOMINAL SURGERY     ??? COLON SURGERY         Social History:   Social History     Socioeconomic History   ??? Marital status: Single     Spouse name: Not on file   ??? Number of children: Not on file   ??? Years of education: Not on file   ??? Highest education level: Not on file   Occupational History   ??? Not on file   Tobacco Use   ??? Smoking status: Current Every Day Smoker     Years: 10.00     Types: Cigarettes   ??? Smokeless tobacco: Current User   ??? Tobacco comment: 1 cig per day   Substance and Sexual Activity   ??? Alcohol use: No   ??? Drug use: No   ??? Sexual activity: Never   Other Topics Concern   ??? Not on file   Social History Narrative   ??? Not on file     Social Determinants of Health     Financial Resource Strain: Not on file   Physical Activity: Not on file   Stress: Not on file       Family History:   Family History   Problem Relation Age of Onset   ??? Heart disease Mother    ??? Diabetes Brother    ??? Diabetes Father    ??? Anesthesia problems Neg Hx    ??? Malignant hypertension Neg Hx    ??? Hypotension Neg Hx    ??? Malignant hyperthermia Neg Hx    ??? Pseudochol deficiency Neg Hx        Medications:  Medications that the patient states to be currently taking   Medication Sig   ??? FLUoxetine 40 mg capsule Take 40 mg by mouth daily.   ??? ondansetron ODT 4 mg disintegrating tablet Take 1 tablet (4 mg total) by mouth every six (6) hours as needed for Nausea or Vomiting.   ??? pantoprazole 40 mg DR tablet Take 40 mg by mouth daily .       Allergies:   is allergic to hydrocodone, ibuprofen, and morphine.    Physical Exam:   Vitals: BP 122/79  ~ Pulse 63  ~ Temp 36.4 ???C (97.5 ???F) (Forehead)  ~ Resp 17  ~ Ht 1.626 m (5' 4'')  ~ Wt 66.7 kg (  147 lb)  ~ SpO2 96%  ~ BMI 25.23 kg/m???   Normal Exam                                     Additional Findings  [x]  General  [x]  HEENT  [x]  Heart / CVS  [x]  Lungs / Respiratory  [x]  Abdomen        Assessment / Plan :   Donna Gross is a 55 y.o. female who presents for egd for RUQ abd pain, hx of vagotomy/antrectomy/gastrojejunostomy for PUD and HH repair c/b N/V, poor PO tolerance. Follows with GI Dr. Ladene Artist.     Procedure: [x]  EGD  []  Colonoscopy  []  PEG  []  Enteroscopy  []  Flexible Sigmoidoscopy                     []  Other:    Level of sedation intended for procedure: []  Moderate  [x]  MAC  []  Anesthesia    Informed Consent Obtained Including Risks, Benefits & Alternatives:  [x]  Yes    Informed Consent Obtained For Sedation Including Risks, Benefits & Alternatives: [x]  Yes      Airway Assessment:    Uvula Visualized: [x]  Yes  []  Partially  []  Not at all  Neck ROM: [x]  Normal  []  Limited  Sleep apnea confirmed by sleep study or on CPAP at home: [x]  No  []  Yes      ASA Classification: ASA 3 - Patient with moderate systemic disease with functional limitations    I have reviewed the history and physical and have determined Donna Gross to be an appropriate candidate to undergo the planned procedure with sedation and analgesia.     I have reviewed the history and physical and have determined Donna Gross to be an appropriate candidate to undergo the planned procedure with sedation.  Patient is cleared for discharge once all discharge criteria is met.  Thank you for allowing me to follow your patient.  Please do not hesitate to call with any questions.    Author: Fransisco Beau, MD, MPH 03/18/2021 9:41 AM

## 2021-03-18 NOTE — H&P
UPDATED H&P REQUIREMENT    For [facility] and [name]    WHAT IS THE STATUS OF THE PATIENT'S MOST CURRENT HISTORY AND PHYSICAL?   - The most current H&P was performed within the past 24 hours. No additional updated H&P documentation is necessary.     REFER TO MEDICAL STAFF POLICIES REGARDING PRE-PROCEDURE HISTORY AND PHYSICAL EXAMINATION AND UPDATED H&P REQUIREMENTS BELOW:    [facility] and [name] Medical Staff Policy 200 - For Patients Undergoing Procedures Requiring Moderate or Deep Sedation, General Anesthesia or Regional Anesthesia    Contents of a History and Physical Examination (H&P):    The H&P shall consist of chief complaint, history of present illness, allergies and medications, relevant social and family history, past medical history, review of systems and physical examination, and assessment and plan appropriate to the patient's age.    For Patients Undergoing Procedures Requiring Moderate or Deep Sedation, General Anesthesia or Regional Anesthesia:    1. An H&P shall be performed within 24 hours prior to the procedure by a qualified member of the medical staff or designee with appropriate privileges, except as noted in item 2 below.    2. If a complete history and physical was performed within thirty (30) calendar days prior to the patient's admission to the Medical Center for elective surgery, a member of the medical staff assumes the responsibility for the accuracy of the clinical information and will need to document in the medical record within twenty-four (24) hours of admission and prior to surgery or major invasive procedure, that they either attest that the history and physical has been reviewed and accepted, or document an update of the original history and physical relevant to the patient's current clinical status.    3. Providing an H&P for  patients undergoing surgery under local anesthesia is at the discretion of the Attending Physician.     4. When a procedure is performed by a dentist, podiatrist or other practitioner who is not privileged to perform an H&P, the anesthesiologist's assessment immediately prior to the procedure will constitute the 24 hour re-assessment.The dentist, podiatrist or other practitioner who is not privileged to perform an H&P will document the history and physical relevant to the procedure.    5. If the H&P and the written informed consent for the surgery or procedure are not recorded in the patient's medical record prior to surgery, the operation shall not be performed unless the attending physician states in writing that such a delay could lead to an adverse event or irreversible damage to the patient.    6. The above requirements shall not preclude the rendering of emergency medical or surgical care to a patient in dire circumstances.

## 2021-03-18 NOTE — Procedures
PATIENT NAME:       Donna Gross, Donna Gross  DATE OF BIRTH:       12-13-1966  RECORD NUMBER:      [id number]  DATE/TIME OF PROCEDURE:     03/18/2021 / 09:33:00 AM  ENDOSCOPIST:       Earnestine Leys, MD  REFERRING PHYSICIAN:        FELLOW:           INDICATIONS FOR EXAMINATION:      egd for RUQ abd pain, hx of vagotomy/antrectomy/gastrojejunostomy for refractory PUD and HH repair c/b N/V, poor PO tolerance. Follows with GI Dr. Ladene Artist.               PROCEDURE PERFORMED:             UPPER GI ENDOSCOPY - diagnostic    MEDICATIONS:    MAC anesthesia    PROCEDURE TECHNIQUE:  Informed consent obtained for the procedure including risks, benefits and alternatives and the risk of those alternatives. Informed consent obtained for sedation including risks, benefits and alternatives and the risk of those alternatives.    EKG, pulse, pulse oximetry and blood pressure were monitored throughout the procedure.     The endoscope was passed with ease through the mouth under direct visualization; it was extended to the 2nd portion of the duodenum. The scope was withdrawn and the mucosa was carefully examined. The views were excellent. The patient's toleration of the   procedure was excellent.    Total Withdrawl Time:   Total Insertion Time:                              EXTENT OF EXAM:  jejunum            INSTRUMENTS:   GIF [id number]  TECHNICAL DIFFICULTY:  No   LIMITATIONS:      none  TOLERANCE:   Good  VISUALIZATION:  Good    FINDINGS:   Esophagus: No esophagitis. 3cm sliding hiatal hernia. EGF/Zline 36cm, Pinch at 39cm.  Stomach: Anatomy consistent with antrectomy and gastrojejunal anatomy. Gastric body with diffuse linear erythema/mild gastritis, No ulcers or erosions. Bile reflux noted during exam.  Gastrojejunal anastomosis without stricture, easily traversable. No ulcers.   Jejeunum: Jejunum just proximal to anastomosis with cobblestoning and blunting of mucosa, biopsied. More proximal jejunum after 10cm with normal mucosa.    ESTIMATED BLOOD LOSS:   None     DIAGNOSIS:  Esophagus: No esophagitis. 3cm sliding hiatal hernia. EGF/Zline 36cm, Pinch at 39cm.  Stomach: Anatomy consistent with antrectomy and gastrojejunal anatomy. Gastric body with diffuse linear erythema/mild gastritis, No ulcers or erosions. Bile reflux noted during exam.  Gastrojejunal anastomosis without stricture, easily traversable. No ulcers.   Jejeunum: Jejunum just proximal to anastomosis with cobblestoning and blunting of mucosa, biopsied. More proximal jejunum after 10cm with normal mucosa.    RECOMMENDATIONS:  Await biopsy results.  Continue PPI daily.  Take sucralfate four times a day for bile acid reflux - with meals and at bedtime.   Follow-up with referring GI physician Dr. Ladene Artist and surgeon Dr. Ave Filter.            This electronic signature authenticates all electronic and/or handwritten documentation, including orders, generated by the signer during the episode of care contained in this record.  03/18/2021 10:15:36 AM By Earnestine Leys

## 2021-03-20 LAB — Tissue Exam

## 2021-04-08 ENCOUNTER — Ambulatory Visit: Payer: PRIVATE HEALTH INSURANCE

## 2021-04-09 ENCOUNTER — Inpatient Hospital Stay
Admit: 2021-04-09 | Discharge: 2021-04-17 | Disposition: A | Discharge status: Skilled Nursing Facility | Payer: PRIVATE HEALTH INSURANCE | Source: Home / Self Care

## 2021-04-09 ENCOUNTER — Ambulatory Visit: Payer: PRIVATE HEALTH INSURANCE

## 2021-04-09 DIAGNOSIS — R7989 Other specified abnormal findings of blood chemistry: Secondary | ICD-10-CM

## 2021-04-09 DIAGNOSIS — E861 Hypovolemia: Secondary | ICD-10-CM

## 2021-04-09 DIAGNOSIS — R7401 Transaminitis: Secondary | ICD-10-CM

## 2021-04-09 DIAGNOSIS — Z720 Tobacco use: Secondary | ICD-10-CM

## 2021-04-09 DIAGNOSIS — S098XXA Other specified injuries of head, initial encounter: Secondary | ICD-10-CM

## 2021-04-09 DIAGNOSIS — I9589 Other hypotension: Secondary | ICD-10-CM

## 2021-04-09 LAB — CK, Total
CREATINE PHOSPHOKINASE, TOTAL: 111 U/L (ref 38–282)
CREATINE PHOSPHOKINASE, TOTAL: 115 U/L (ref 38–282)

## 2021-04-09 LAB — Blood Culture Detection
BLOOD CULTURE FINAL STATUS: NEGATIVE
BLOOD CULTURE FINAL STATUS: NEGATIVE
BLOOD CULTURE FINAL STATUS: POSITIVE — CR
BLOOD CULTURE FINAL STATUS: POSITIVE — CR
BLOOD CULTURE FINAL STATUS: POSITIVE — CR
BLOOD CULTURE FINAL STATUS: NEGATIVE
BLOOD CULTURE FINAL STATUS: NEGATIVE
BLOOD CULTURE FINAL STATUS: NEGATIVE
BLOOD CULTURE PRELIMINARY STATUS: NEGATIVE

## 2021-04-09 LAB — Drug Screen,serum: ACETAMINOPHEN,SERUM: 29 ug/mL — ABNORMAL HIGH (ref 10–20)

## 2021-04-09 LAB — APTT: APTT: 27.3 s (ref 24.4–36.2)

## 2021-04-09 LAB — Differential Automated
ABSOLUTE EOS COUNT: 0.04 10*3/uL (ref 0.00–0.50)
ABSOLUTE NEUT COUNT: 3.9 10*3/uL (ref 1.80–6.90)

## 2021-04-09 LAB — Basic Metabolic Panel
ANION GAP: 13 mmol/L (ref 8–19)
POTASSIUM: 3.9 mmol/L (ref 3.6–5.3)

## 2021-04-09 LAB — Prothrombin Time Panel: PROTHROMBIN TIME: 13.3 s (ref 11.5–14.4)

## 2021-04-09 LAB — CBC
MEAN CORPUSCULAR HEMOGLOBIN: 26.9 pg (ref 26.4–33.4)
NEUTROPHILS ABS (PRELIM): 8.9 10*3/uL (ref 143–398)

## 2021-04-09 LAB — Hepatic Funct Panel
ALBUMIN FORHEPFUNCTPNL: 3.8 g/dL — ABNORMAL LOW (ref 3.9–5.0)
TOTAL PROTEIN: 6.4 g/dL (ref 6.1–8.2)

## 2021-04-09 LAB — Blood Gases, venous,POC: PCO2,VENOUS,POC: 33 mmHg — ABNORMAL LOW (ref 37–65)

## 2021-04-09 LAB — Lipase: LIPASE: 51 U/L (ref 13–69)

## 2021-04-09 LAB — Sepsis Lactate Protocol: BLOOD LACTATE: 26 mg/dL — ABNORMAL HIGH (ref 5–18)

## 2021-04-09 LAB — Troponin I: TROPONIN I: <0.04 ng/mL (ref <0.04)

## 2021-04-09 LAB — Procalcitonin: PROCALCITONIN: 12.22 ug/L — CR (ref <0.10)

## 2021-04-09 LAB — Acute Hepatitis Panel: HCV ANTIBODY SCREEN: NONREACTIVE

## 2021-04-09 LAB — Sepsis Lactate Repeat: BLOOD LACTATE: 20 mg/dL — ABNORMAL HIGH (ref 5–18)

## 2021-04-09 LAB — Expedited COVID-19 and Influenza A B PCR: INFLUENZA A PCR: NOT DETECTED

## 2021-04-09 LAB — Comprehensive Metabolic Panel
POTASSIUM: 4.3 mmol/L (ref 3.6–5.3)
TOTAL PROTEIN: 6.5 g/dL (ref 6.1–8.2)

## 2021-04-09 LAB — UA,Microscopic: WBCS HPF: 1 {cells}/[HPF] (ref 0–4)

## 2021-04-09 LAB — Glucose,POC: GLUCOSE,POC: 127 mg/dL — ABNORMAL HIGH (ref 65–99)

## 2021-04-09 LAB — Amylase: AMYLASE: 64 U/L (ref 31–124)

## 2021-04-09 LAB — Bacterial Blood Culture Workup

## 2021-04-09 LAB — Blood Lactate: BLOOD LACTATE: 14 mg/dL (ref 5–18)

## 2021-04-09 LAB — UA,Dipstick

## 2021-04-09 MED ADMIN — METOCLOPRAMIDE HCL 10 MG PO TABS: 10 mg | ORAL | @ 19:00:00 | Stop: 2021-05-09 | NDC 51079088801

## 2021-04-09 MED ADMIN — AMPICILLIN-SULBACTAM 3 GM IVPB: 3 g | INTRAVENOUS | @ 11:00:00 | Stop: 2021-04-09 | NDC 00338004938

## 2021-04-09 MED ADMIN — QUETIAPINE FUMARATE 100 MG PO TABS: 100 mg | ORAL | @ 12:00:00 | Stop: 2021-04-09

## 2021-04-09 MED ADMIN — GADOBUTROL 1 MMOL/ML IV SOLN: 7.5 mL | INTRAVENOUS | @ 11:00:00 | Stop: 2021-04-09 | NDC 50419032511

## 2021-04-09 MED ADMIN — HYDROMORPHONE HCL 2 MG PO TABS: 4 mg | ORAL | @ 10:00:00 | Stop: 2021-04-09 | NDC 42858030125

## 2021-04-09 MED ADMIN — NICOTINE 14 MG/24HR TD PT24: 14 mg | TRANSDERMAL | @ 16:00:00 | Stop: 2021-04-12 | NDC 00536589588

## 2021-04-09 MED ADMIN — PANTOPRAZOLE SODIUM 40 MG PO TBEC: 40 mg | ORAL | @ 16:00:00 | Stop: 2021-04-12 | NDC 50268063911

## 2021-04-09 MED ADMIN — METOCLOPRAMIDE HCL 10 MG PO TABS: 10 mg | ORAL | @ 13:00:00 | Stop: 2021-04-12 | NDC 51079088801

## 2021-04-09 MED ADMIN — OXYCODONE HCL 5 MG PO TABS: 5 mg | ORAL | @ 16:00:00 | Stop: 2021-04-12 | NDC 68084035411

## 2021-04-09 MED ADMIN — OXYCODONE HCL 5 MG PO TABS: 5 mg | ORAL | @ 23:00:00 | Stop: 2021-04-12 | NDC 68084035411

## 2021-04-09 MED ADMIN — LACTATED RINGERS IV SOLN: 75 mL/h | INTRAVENOUS | @ 17:00:00 | Stop: 2021-04-12 | NDC 00338011704

## 2021-04-09 MED ADMIN — OXYCODONE HCL 5 MG PO TABS: 5 mg | ORAL | @ 10:00:00 | Stop: 2021-04-12 | NDC 68084035411

## 2021-04-09 MED ADMIN — TRAZODONE HCL 100 MG PO TABS: 100 mg | ORAL | @ 12:00:00 | Stop: 2021-04-12

## 2021-04-09 MED ADMIN — MULTI-VITAMINS PO TABS: 1 | ORAL | @ 19:00:00 | Stop: 2021-04-12

## 2021-04-09 MED ADMIN — AMPICILLIN-SULBACTAM 3 GM IVPB: 3 g | INTRAVENOUS | @ 17:00:00 | Stop: 2021-04-09 | NDC 00338004938

## 2021-04-09 MED ADMIN — ONDANSETRON 4 MG PO TBDP: 4 mg | ORAL | @ 10:00:00 | Stop: 2021-04-09 | NDC 57237007710

## 2021-04-09 MED ADMIN — QUETIAPINE FUMARATE 100 MG PO TABS: 100 mg | ORAL | Stop: 2021-04-09

## 2021-04-09 MED ADMIN — SODIUM CHLORIDE 0.9% IV SOLN (250 ML): 10 mL/h | INTRAVENOUS | @ 12:00:00 | Stop: 2021-04-17 | NDC 00338004902

## 2021-04-09 MED ADMIN — PIPERACILLIN-TAZOBACTAM 3.375 G/50 ML RTU: 3.375 g | INTRAVENOUS | @ 20:00:00 | Stop: 2021-04-09 | NDC 00206886101

## 2021-04-09 MED ADMIN — ONDANSETRON HCL 4 MG/2ML IJ SOLN: 4 mg | INTRAVENOUS | @ 19:00:00 | Stop: 2021-04-12 | NDC 60505613000

## 2021-04-09 MED ADMIN — SUCRALFATE 1 GM/10ML PO SUSP: 1 g | ORAL | @ 19:00:00 | Stop: 2021-04-12

## 2021-04-09 MED ADMIN — SODIUM CHLORIDE 0.9 % IV BOLUS (SEPSIS): 2030 mL | INTRAVENOUS | @ 06:00:00 | Stop: 2021-04-09 | NDC 00338004904

## 2021-04-09 MED ADMIN — HYDROMORPHONE HCL 1 MG/ML IJ SOLN: .5 mg | INTRAVENOUS | @ 13:00:00 | Stop: 2021-04-12 | NDC 00409128331

## 2021-04-09 MED ADMIN — HYDROMORPHONE HCL 1 MG/ML IJ SOLN: .5 mg | INTRAVENOUS | @ 19:00:00 | Stop: 2021-04-12 | NDC 00409128331

## 2021-04-09 NOTE — ED Notes
COLLECTIVE?NOTIFICATION?04/08/2021 21:10?Donna Gross?MRN: [id number]    Donna Gross's patient encounter information:   XBJ:[id number]  Account [id number]  Billing Account [id number]      Criteria Met      6 Visits in 180 Days    2 Visits in 30 Days    Security and Safety  No recent Security Events currently on file    ED Care Guidelines  There are currently no ED Care Guidelines for this patient. Please check your facility's medical records system.            E.D. Visit Count (12 mo.)  Facility Visits   [facility] 2   [facility] 1   Donna Gross 8   [name] [place] 6   Total 17   Note: Visits indicate total known visits.     Recent Emergency Department Visit Summary  Showing 10 most recent visits out of 17 in the past 12 months  Date Facility [facility] Type Diagnoses or Chief Complaint   Apr 08, 2021 Donna Gross. [place] [facility]     Apr 03, 2021 Donna Gross [facility] [facility]  Chief Complaint: CHEST PAIN, HEADACHE, VOMITING    Mar 09, 2021 Donna Gross [facility]  Chief Complaint: CHEST;ABD PAIN/ VOMITTING    Mar 02, 2021 [facility] [place] [facility]      1. Abdominal Pain      1. Epigastric pain      Feb 10, 2021 [facility] [place] [facility]      1. Abdominal Pain      1. Right upper quadrant pain      2. Other chronic pain      Jan 18, 2021 [facility] [place] [facility]      1. Right upper quadrant pain      2. Other specified diseases of gallbladder      3. Abdominal Pain      Jan 17, 2021 Donna Gross [place] [facility]  Chief Complaint: CHEST PAIN; ABD PAIN; BACK PAIN    Jan 12, 2021 [facility]      1. Unspecified abdominal pain      2. Abdominal Pain      Jan 11, 2021 [facility]      -1. Headache, unspecified      -1. Dizziness and giddiness      0. Unspecified convulsions      5. Calculus of gallbladder without cholecystitis without obstruction      6. Dehydration      7. Acute kidney failure, unspecified      8. Peptic ulcer, site unspecified, unspecified as acute or chronic, without hemorrhage or perforation      9. Gastritis, unspecified, without bleeding      10. Gastro-esophageal reflux disease without esophagitis      11. Chronic pain syndrome      Dec 27, 2020 [facility]      1. Abdominal Pain      1. Calculus of bile duct without cholangitis or cholecystitis without obstruction          Recent Inpatient Visit Summary  Date Facility [facility] Type Diagnoses or Chief Complaint   Dec 06, 2020 [name]  Chief Complaint: CHEST PAIN-ABD  PAIN        Care Team  [facility] Phone Fax Service Dates   BUTLER, Josem Kaufmann, M.D. Family Medicine   Current    Sela Hilding, M.D. Family Medicine   Current      Collective Portal  This patient has registered at the [facility] Emergency Department   For more information visit: [email or web address]   PLEASE NOTE:     1.   Any care recommendations and other clinical information are provided as guidelines or for historical purposes only, and providers should exercise their own clinical judgment when providing care.    2.   You may only use this information for purposes of treatment, payment or health care operations activities, and subject to the limitations of applicable Collective Policies.    3.   You should consult directly with the organization that provided a care guideline or other clinical history with any questions about additional information or accuracy or completeness of information provided.    ? 2022 [place], [employer] - [email or web address]
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
## 2021-04-09 NOTE — Other
Patients Clinical Goal:   Clinical Goal(s) for the Shift: Safety, nausea, and pain management  Identify possible barriers to advancing the care plan: none  Stability of the patient: Moderately Stable - low risk of patient condition declining or worsening   End of Shift Summary: Patient is alert an oriented x4, bmat 3, sat are in the low 90's on room air. Skin Intact. NPO except sips of water. Pain and nausea controlled

## 2021-04-09 NOTE — ED Notes
MD at bedside. 

## 2021-04-09 NOTE — ED Notes
Went to MRI via gurney.

## 2021-04-09 NOTE — Nursing Note
CRITICAL CARE TRANSPORT RN NOTE    4:19 AM  Patient transported from ED to on monitor (in SR). On room air. Vital signs stable. Tolerated transport well and was uneventful. All belongings with patient.  Settled into room.  Report given to Donna Gross, primary RN. All questions answered.    Astrid Drafts [place], [place]  04/09/2021  4:19 AM    s

## 2021-04-09 NOTE — ED Notes
Report given to [place],

## 2021-04-09 NOTE — ED Notes
Lab bedside. Pending blood cultures collected.

## 2021-04-09 NOTE — ED Notes
Pt in [place]. Covid PCR to be collected on return.

## 2021-04-10 DIAGNOSIS — K8050 Calculus of bile duct without cholangitis or cholecystitis without obstruction: Secondary | ICD-10-CM

## 2021-04-10 DIAGNOSIS — I9589 Other hypotension: Secondary | ICD-10-CM

## 2021-04-10 DIAGNOSIS — K279 Peptic ulcer, site unspecified, unspecified as acute or chronic, without hemorrhage or perforation: Secondary | ICD-10-CM

## 2021-04-10 DIAGNOSIS — A419 Sepsis, unspecified organism: Secondary | ICD-10-CM

## 2021-04-10 DIAGNOSIS — R7881 Bacteremia: Secondary | ICD-10-CM

## 2021-04-10 DIAGNOSIS — R6520 Severe sepsis without septic shock: Secondary | ICD-10-CM

## 2021-04-10 DIAGNOSIS — B9620 Unspecified Escherichia coli [E. coli] as the cause of diseases classified elsewhere: Secondary | ICD-10-CM

## 2021-04-10 LAB — Comprehensive Metabolic Panel
ALANINE AMINOTRANSFERASE: 314 U/L — ABNORMAL HIGH (ref 8–70)
ALANINE AMINOTRANSFERASE: 314 U/L — ABNORMAL HIGH (ref 8–70)

## 2021-04-10 LAB — CBC: HEMATOCRIT: 28.6 — ABNORMAL LOW (ref 34.9–45.2)

## 2021-04-10 LAB — Bacterial Blood Culture Workup

## 2021-04-10 LAB — Differential Automated: ABSOLUTE NEUT COUNT: 6.14 10*3/uL (ref 1.80–6.90)

## 2021-04-10 MED ADMIN — HYDROMORPHONE HCL 1 MG/ML IJ SOLN: .5 mg | INTRAVENOUS | @ 16:00:00 | Stop: 2021-04-12 | NDC 00409128331

## 2021-04-10 MED ADMIN — HYDROMORPHONE HCL 1 MG/ML IJ SOLN: .5 mg | INTRAVENOUS | @ 05:00:00 | Stop: 2021-04-12 | NDC 00409128331

## 2021-04-10 MED ADMIN — SUCRALFATE 1 GM/10ML PO SUSP: 1 g | ORAL | @ 21:00:00 | Stop: 2021-04-12 | NDC 50268073211

## 2021-04-10 MED ADMIN — SUCRALFATE 1 GM/10ML PO SUSP: 1 g | ORAL | @ 16:00:00 | Stop: 2021-04-12 | NDC 69339014819

## 2021-04-10 MED ADMIN — NICOTINE POLACRILEX 2 MG MT GUM: 2 mg | ORAL | @ 04:00:00 | Stop: 2021-04-12 | NDC 00536302923

## 2021-04-10 MED ADMIN — QUETIAPINE FUMARATE 100 MG PO TABS: 100 mg | ORAL | @ 16:00:00 | Stop: 2021-04-12 | NDC 00904664061

## 2021-04-10 MED ADMIN — ONDANSETRON HCL 4 MG/2ML IJ SOLN: 4 mg | INTRAVENOUS | @ 21:00:00 | Stop: 2021-04-12 | NDC 60505613000

## 2021-04-10 MED ADMIN — NICOTINE 14 MG/24HR TD PT24: 14 mg | TRANSDERMAL | @ 02:00:00 | Stop: 2021-04-12 | NDC 00536589588

## 2021-04-10 MED ADMIN — SUCRALFATE 1 GM/10ML PO SUSP: 1 g | ORAL | @ 01:00:00 | Stop: 2021-04-12

## 2021-04-10 MED ADMIN — METOCLOPRAMIDE HCL 10 MG PO TABS: 10 mg | ORAL | @ 21:00:00 | Stop: 2021-04-12 | NDC 51079088801

## 2021-04-10 MED ADMIN — ONDANSETRON HCL 4 MG/2ML IJ SOLN: 4 mg | INTRAVENOUS | @ 01:00:00 | Stop: 2021-04-12 | NDC 60505613000

## 2021-04-10 MED ADMIN — NICOTINE 14 MG/24HR TD PT24: 14 mg | TRANSDERMAL | @ 16:00:00 | Stop: 2021-04-12 | NDC 00536589588

## 2021-04-10 MED ADMIN — MULTI-VITAMINS PO TABS: 1 | ORAL | @ 16:00:00 | Stop: 2021-04-12 | NDC 00904053961

## 2021-04-10 MED ADMIN — QUETIAPINE FUMARATE 100 MG PO TABS: 100 mg | ORAL | @ 04:00:00 | Stop: 2021-04-12 | NDC 00904664061

## 2021-04-10 MED ADMIN — TRAZODONE HCL 100 MG PO TABS: 100 mg | ORAL | @ 04:00:00 | Stop: 2021-04-12 | NDC 00904686961

## 2021-04-10 MED ADMIN — ONDANSETRON HCL 4 MG/2ML IJ SOLN: 4 mg | INTRAVENOUS | @ 16:00:00 | Stop: 2021-04-12 | NDC 00641607801

## 2021-04-10 MED ADMIN — HYDROMORPHONE HCL 1 MG/ML IJ SOLN: .5 mg | INTRAVENOUS | @ 01:00:00 | Stop: 2021-04-12 | NDC 00409128331

## 2021-04-10 MED ADMIN — OXYCODONE HCL 5 MG PO TABS: 5 mg | ORAL | Stop: 2021-04-12 | NDC 68084035411

## 2021-04-10 MED ADMIN — PIPERACILLIN-TAZOBACTAM 3.375 GM/50 ML RTU (EXT 4HR): 3.375 g | INTRAVENOUS | @ 11:00:00 | Stop: 2021-04-10 | NDC 00206886101

## 2021-04-10 MED ADMIN — PIPERACILLIN-TAZOBACTAM 3.375 GM/50 ML RTU (EXT 4HR): 3.375 g | INTRAVENOUS | @ 04:00:00 | Stop: 2021-04-10 | NDC 00206886101

## 2021-04-10 MED ADMIN — METOCLOPRAMIDE HCL 10 MG PO TABS: 10 mg | ORAL | @ 13:00:00 | Stop: 2021-04-12 | NDC 51079088801

## 2021-04-10 MED ADMIN — PIPERACILLIN-TAZOBACTAM 3.375 GM/50 ML RTU (EXT 4HR): 3.375 g | INTRAVENOUS | @ 22:00:00 | Stop: 2021-04-10 | NDC 00206886101

## 2021-04-10 MED ADMIN — LACTATED RINGERS IV SOLN: 75 mL/h | INTRAVENOUS | @ 11:00:00 | Stop: 2021-04-12 | NDC 00338011704

## 2021-04-10 MED ADMIN — FLUOXETINE HCL 40 MG PO CAPS: 40 mg | ORAL | @ 22:00:00 | Stop: 2021-04-12 | NDC 68084010111

## 2021-04-10 MED ADMIN — FLUOXETINE HCL 40 MG PO CAPS: 40 mg | ORAL | @ 04:00:00 | Stop: 2021-04-12 | NDC 68084010111

## 2021-04-10 MED ADMIN — HYDROMORPHONE HCL 1 MG/ML IJ SOLN: .5 mg | INTRAVENOUS | @ 21:00:00 | Stop: 2021-04-12 | NDC 00409128331

## 2021-04-10 MED ADMIN — FLUOXETINE HCL 40 MG PO CAPS: 40 mg | ORAL | @ 16:00:00 | Stop: 2021-04-12

## 2021-04-10 MED ADMIN — SUCRALFATE 1 GM/10ML PO SUSP: 1 g | ORAL | @ 04:00:00 | Stop: 2021-04-12 | NDC 50268073211

## 2021-04-10 MED ADMIN — METOCLOPRAMIDE HCL 10 MG PO TABS: 10 mg | ORAL | Stop: 2021-04-12 | NDC 51079088801

## 2021-04-10 MED ADMIN — PANTOPRAZOLE SODIUM 40 MG PO TBEC: 40 mg | ORAL | @ 16:00:00 | Stop: 2021-04-12 | NDC 50268063911

## 2021-04-10 MED ADMIN — PROMETHAZINE HCL 12.5 MG PO TABS: 12.5 mg | ORAL | Stop: 2021-04-12 | NDC 68084015411

## 2021-04-10 NOTE — Other
Patients Clinical Goal:   Clinical Goal(s) for the Shift: pain management, safety, vss  Identify possible barriers to advancing the care plan:   Stability of the patient: Moderately Unstable - medium risk of patient condition declining or worsening    End of Shift Summary: Pt AOx4. Adequate O2 Sat on 2LNC. Cont pulse ox. SR on the monitor. VSS. Pain controlled with PRN dilaudid. BMAT 4. Up to toilet. Skin intact. No acute distress noted. Pending cdiff r/o. Call light within reach, bed in lowest position, bed alarm on. Will cont plan of care.    BP 102/63  ~ Pulse 68  ~ Temp 37.7 ???C (99.8 ???F) (Oral)  ~ Resp 13  ~ Wt 67.6 kg (149 lb)  ~ SpO2 96%  ~ BMI 25.58 kg/m???     GERIATRICS END OF SHIFT - DISCHARGE MARKERS of Instability Checklist  Nursing assessment:     Indicator   Cutoff Check if indicator needs to be addressed (meets cutoff)   Situation/Action/Comment   O2 requirement New since admission [x]     PO intake Less than 50% []     Urination None in past 8 hours/last shift []     Foley New since admission []     Pain Present  [x]     Bowel movements  <1 in 2 days or >3 in 24 hours []     Delirium Unable to follow simple commands or participate with PT/OT []     Mobility Unable to stand and/or walk to bathroom []         BOOST / SAFE TRANSITION - DISCHARGE INDICATORS    Indicator Check if indicator has red ''P''    Situation/Action/Comment     Problems with Medications  []     Psychological  []     Principal Diagnosis  []     Physical Limitations  []     Poor Health Literacy  []     Patient Support  []     Prior Hospitalizations  []     Palliative Care  []       DELIRIUM PREVENTION  Protocols Strategies Check if implemented Comments   Risk factors >3 present- High risk []     Purposeful orientation Reorient, purposeful orienting conversation  Familiar objects in room []   []     Therapeutic activities Volunteer visit  Cognitive stimulation activities: games, reading, music []   []     Vision & hearing Assistance with: Leisure centre manager  Assistance with: hearing aids/hearing amplifier []   []     Feeding & hydration Assistance with feeding  Assistance with dentures []   []     Sleep hygiene Shades/blinds/lights on during day, limit naps during day  Quiet environment, consolidate care []     Mobilization BMAT 3-4: Ambulate TID  BMAT 2: OOB daily to chair for meals ? 2 hours, each time  BMAT 1: OOB to cardiac chair daily for meals ? 2 hours, each time [x]   []   []     Pain Non-narcotics ATC  Non-pharmacological: oil/aromatherapy, massage, music []   []     Maintain safety Fall precautions, volunteer visit, family at bedside, tele sitter, constant observer [x]     Manage agitation Redirect with calm, gentle voice and avoid confrontation  Avoid restraints and use alternative to restraints  Doll, music or animal therapy, as appropriate  Volunteer for companionship if safe and appropriate []   []   []   []       DELIRIUM: CAM (+)  Protocols Strategies Check if implemented Comments   New-onset MD contacted  Delirium order-set initiated  Bladder scan to R/O retention  Assess stool impaction  Medication reviewed with pharmacist []   []   []   []   []  MD Name:   Existing Manage and prevent further delirium []         Report received from home prior to transfer.  Patient received from at time of 1950, accompanied by transporter.    On arrival, patient oriented x4, able to verbalize needs.  Vital signs noted Blood pressure 102/63, pulse 68, temperature 37.7 ???C (99.8 ???F), temperature source Oral, resp. rate 13, weight 67.6 kg (149 lb), SpO2 96 %.  Patient is on cardiac monitor.  Heart rhythm is normal sinus rhythm with none for ectopy.  Patient is on room air  Skin assessment done with 2nd RN Rose, noted skin intact  Patient is on isolation.    Patient has no home medications brought into hospital.  The patient was oriented/educated to call light, bed, and surroundings.  Call light in reach.  Fall prevention education provided.

## 2021-04-10 NOTE — Nursing Note
0800 pt a+ox4, bed in lowest position, call light within reach, bed alarm on. Denies pain or discomfort at this time,  Will continue to monitor.    0845 IV dilaudid given for pain to head and neck. Will continue to monitor.    1500 r/o cdiff isolation DC'd per MD Daphane Shepherd. Tele DC'd monitor room notified.

## 2021-04-10 NOTE — Nursing Note
1855--Pt to be transferred to 5[facility] #[id number].  Report called to [facility], RN.    1900--Paged and spoke with Lucious Groves, on call MD, regarding need for the team to remove the cardiac monitor order since Dr. Daphane Shepherd downgraded pt to nonmonitoring today.  MD to remove the CR monitoring order.

## 2021-04-10 NOTE — Progress Notes
Pharmaceutical Services - Admission Medication Reconciliation Note      Patient Name: Donna Gross  Medical Record Number: [id number]  Admit date: 04/09/2021 12:39 AM    Age: 55 y.o.  Sex: female  Allergies: Hydrocodone, Ibuprofen, and Morphine  Height:   Most recent documented height   03/18/21 1.626 m (5' 4'')     Actual Weight:   Most recent documented weight   04/08/21 67.6 kg   03/18/21 66.7 kg     Diagnosis: The patient is currently admitted with the following concerns/issues: Active Problems:    Arterial hypotension POA: Yes    Hypotension POA: Yes      Reported Medication History   I spoke with the patient at bedside, used Surescripts (outpatient Rx fill history database), and used the Outside Records Activity to update the home medication list for this hospital admission.    PTA Medication List (discrepancies are noted)   Prior to Admission medications as of 04/10/21 1524   Medication Sig Notes Last Dose   aluminum-magnesium hydroxide-simethicone 400-400-40 mg/5 mL suspension 10 mLs, Oral, Every 4 hours PRN  Patient taking differently: Take 10 mLs by mouth every four (4) hours as needed for Indigestion .      clobetasol 0.05% cream Topical, 3 times daily PRN      FLUoxetine 40 mg capsule 40 mg, Oral, Daily   Past Week at Unknown time   fluticasone-salmeterol (WIXELA INHUB) 250-50 mcg/dose diskus 1 puff, Inhalation, 2 times daily PRN      ipratropium-albuterol 20-100 mcg/act inhaler 2 puffs, Inhalation, 3 times daily PRN      lidocaine viscous 2% oral solution 5 mLs, Swish & Spit, Every 4 hours PRN      metoclopramide 10 mg tablet 10 mg, Oral, 3 times daily before meals      multivitamin tablet 1 tablet, Oral, Daily      Naloxone HCl 4 MG/0.1ML LIQD Call 911. Administer a single spray intranasally into one nostril for opioid overdose. May repeat in 3 minutes if patient is not breathing.      ondansetron ODT 4 mg disintegrating tablet 4 mg, Oral, Every 6 hours PRN      oxyCODONE 5 mg tablet 5 mg, Oral, Every 6 hours PRN      oxyCODONE-acetaminophen 5-325 mg tablet 1 tablet, Oral, Every 6 hours PRN      pantoprazole 40 mg DR tablet 40 mg, Oral, Daily      quetiapine 100 mg tablet 100 mg, Oral, 2 times daily      sucralfate 1 g/10 mL suspension 1 g, Oral, 3 times daily with meals & at bedtime      traZODone 100 mg tablet 100 mg, Oral, Every night at bedtime            Discharge Prescription Preference:   [id number] [place] - [address] AT corner of [address]  [address] [place] [address]        The patient's medications have been reviewed and the PTA med list is updated.     The reconciliation of admission orders with PTA med list is complete. There are no issues requiring follow up at this time.    Sosha Shepherd A. Fredric Mare, Ilda Basset.D., BCPS  Clinical Pharmacist, Geriatrics  Ext: [phone]    04/10/2021, 3:25 PM
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
## 2021-04-11 ENCOUNTER — Ambulatory Visit: Payer: PRIVATE HEALTH INSURANCE

## 2021-04-11 LAB — Comprehensive Metabolic Panel
ALBUMIN: 3.3 g/dL — ABNORMAL LOW (ref 3.9–5.0)
SODIUM: 137 mmol/L (ref 135–146)

## 2021-04-11 LAB — Bacterial Blood Culture Workup

## 2021-04-11 LAB — COVID-19 PCR: COVID-19 PCR/TMA: NOT DETECTED

## 2021-04-11 LAB — Blood Culture Detection
BLOOD CULTURE FINAL STATUS: POSITIVE — CR
BLOOD CULTURE FINAL STATUS: NEGATIVE
BLOOD CULTURE FINAL STATUS: NEGATIVE
BLOOD CULTURE FINAL STATUS: NEGATIVE
BLOOD CULTURE FINAL STATUS: NEGATIVE

## 2021-04-11 LAB — C difficile PCR: C DIFFICILE PCR: NEGATIVE

## 2021-04-11 LAB — CBC: MCH CONCENTRATION: 31.1 g/dL — ABNORMAL LOW (ref 31.5–35.5)

## 2021-04-11 LAB — Differential Automated: ABSOLUTE IMMATURE GRAN COUNT: 0.02 10*3/uL (ref 0.00–0.04)

## 2021-04-11 LAB — Magnesium: MAGNESIUM: 1.5 meq/L (ref 1.4–1.9)

## 2021-04-11 MED ADMIN — TRAZODONE HCL 100 MG PO TABS: 100 mg | ORAL | @ 03:00:00 | Stop: 2021-04-12 | NDC 60687045411

## 2021-04-11 MED ADMIN — ERTAPENEM IVPB: 1 g | INTRAVENOUS | @ 01:00:00 | Stop: 2021-04-12 | NDC 60505619600

## 2021-04-11 MED ADMIN — SUCRALFATE 1 GM/10ML PO SUSP: 1 g | ORAL | @ 03:00:00 | Stop: 2021-04-12 | NDC 50268073211

## 2021-04-11 MED ADMIN — MULTI-VITAMINS PO TABS: 1 | ORAL | @ 15:00:00 | Stop: 2021-04-12 | NDC 00904053961

## 2021-04-11 MED ADMIN — METOCLOPRAMIDE HCL 10 MG PO TABS: 10 mg | ORAL | @ 13:00:00 | Stop: 2021-04-12

## 2021-04-11 MED ADMIN — LACTATED RINGERS IV SOLN: 75 mL/h | INTRAVENOUS | @ 17:00:00 | Stop: 2021-04-12 | NDC 00338011704

## 2021-04-11 MED ADMIN — METOCLOPRAMIDE HCL 10 MG PO TABS: 10 mg | ORAL | @ 23:00:00 | Stop: 2021-04-12

## 2021-04-11 MED ADMIN — NICOTINE 14 MG/24HR TD PT24: 14 mg | TRANSDERMAL | @ 15:00:00 | Stop: 2021-04-12

## 2021-04-11 MED ADMIN — PANTOPRAZOLE SODIUM 40 MG PO TBEC: 40 mg | ORAL | @ 15:00:00 | Stop: 2021-04-12 | NDC 50268063911

## 2021-04-11 MED ADMIN — SUCRALFATE 1 GM/10ML PO SUSP: 1 g | ORAL | @ 15:00:00 | Stop: 2021-04-12 | NDC 50268073211

## 2021-04-11 MED ADMIN — OXYCODONE HCL 5 MG PO TABS: 5 mg | ORAL | @ 15:00:00 | Stop: 2021-04-12 | NDC 68084035411

## 2021-04-11 MED ADMIN — HYDROMORPHONE HCL 1 MG/ML IJ SOLN: .5 mg | INTRAVENOUS | @ 02:00:00 | Stop: 2021-04-12 | NDC 00409128331

## 2021-04-11 MED ADMIN — HYDROMORPHONE HCL 1 MG/ML IJ SOLN: .5 mg | INTRAVENOUS | @ 13:00:00 | Stop: 2021-04-12 | NDC 00409128331

## 2021-04-11 MED ADMIN — METOCLOPRAMIDE HCL 10 MG PO TABS: 10 mg | ORAL | @ 01:00:00 | Stop: 2021-04-12 | NDC 51079088801

## 2021-04-11 MED ADMIN — LACTATED RINGERS IV SOLN: 75 mL/h | INTRAVENOUS | @ 03:00:00 | Stop: 2021-04-12 | NDC 00338011704

## 2021-04-11 MED ADMIN — FLUOXETINE HCL 40 MG PO CAPS: 40 mg | ORAL | @ 15:00:00 | Stop: 2021-04-12 | NDC 68084010111

## 2021-04-11 MED ADMIN — HYDROMORPHONE HCL 1 MG/ML IJ SOLN: .5 mg | INTRAVENOUS | @ 21:00:00 | Stop: 2021-04-12 | NDC 00409128331

## 2021-04-11 MED ADMIN — ONDANSETRON HCL 4 MG/2ML IJ SOLN: 4 mg | INTRAVENOUS | @ 13:00:00 | Stop: 2021-04-12 | NDC 00641607801

## 2021-04-11 MED ADMIN — QUETIAPINE FUMARATE 100 MG PO TABS: 100 mg | ORAL | @ 03:00:00 | Stop: 2021-04-12 | NDC 00904664061

## 2021-04-11 MED ADMIN — HYDROMORPHONE HCL 1 MG/ML IJ SOLN: .5 mg | INTRAVENOUS | @ 17:00:00 | Stop: 2021-04-12 | NDC 00409128331

## 2021-04-11 MED ADMIN — ONDANSETRON HCL 4 MG/2ML IJ SOLN: 4 mg | INTRAVENOUS | @ 06:00:00 | Stop: 2021-04-12 | NDC 00641607801

## 2021-04-11 MED ADMIN — OXYCODONE HCL 5 MG PO TABS: 5 mg | ORAL | @ 03:00:00 | Stop: 2021-04-16 | NDC 68084035411

## 2021-04-11 MED ADMIN — SUCRALFATE 1 GM/10ML PO SUSP: 1 g | ORAL | @ 01:00:00 | Stop: 2021-04-12 | NDC 50268073211

## 2021-04-11 MED ADMIN — QUETIAPINE FUMARATE 100 MG PO TABS: 100 mg | ORAL | @ 15:00:00 | Stop: 2021-04-12 | NDC 00904664061

## 2021-04-11 MED ADMIN — SUCRALFATE 1 GM/10ML PO SUSP: 1 g | ORAL | @ 23:00:00 | Stop: 2021-04-12

## 2021-04-11 MED ADMIN — HYDROMORPHONE HCL 1 MG/ML IJ SOLN: .5 mg | INTRAVENOUS | @ 06:00:00 | Stop: 2021-04-12 | NDC 00409128331

## 2021-04-11 NOTE — Progress Notes
NUTRITION IN-DEPTH SCREEN (Adult)    Admit Date: 04/09/2021     Date of Birth: Mar 13, 1966 Gender: female MRN: [id number]     Date of Screening 04/11/2021   Subjective: Visited pt at bedside.  Pt observed sitting up in bed at time of RD visit.  States she has been doing well with meals since admission, however currently NPO for ERCP.  States she is hungry and eager to receive food after procedure is complete.  States she would like to receive snacks at 2 PM and 8 PM.  Reports good appetite and PO intake PTA.  Follows regular diet at home.  No problems chewing/swallowing reported.  Reports she suffers from nausea at times, takes zofran.  Reports last BM today.  NKFA reported.  UBW 145#, no significant  weight changes reported PTA.     Problems: Active Problems:    Arterial hypotension POA: Yes    Hypotension POA: Yes       Past Medical History:   Diagnosis Date   ??? Anxiety    ??? Depression    ??? Emphysema, unspecified (HCC/RAF)    ??? Emphysema, unspecified (HCC/RAF)    ??? Fibromyalgia    ??? Gastritis    ??? GERD (gastroesophageal reflux disease)    ??? Pancreatitis     Past Surgical History:   Procedure Laterality Date   ??? ABDOMINAL SURGERY     ??? COLON SURGERY           Anthropometrics     Height:    Admit Weight: 67.6 kg (149 lb) (04/08/21 2137) Last 5 recorded weights:  Weights 01/18/2021 02/06/2021 02/24/2021 03/18/2021 04/08/2021   Weight 67.5 kg 66.8 kg 66.7 kg 66.7 kg 67.6 kg     Adjusted Weight (kg): 57.8      IBW: 54.4 kg (120 lb)  % Ideal Body Weight: 124 %       Usual Weight: 65.8 kg (145 lb)  % Usual Weight: 103 %      Allergies   Hydrocodone, Ibuprofen, and Morphine     Cultural / Religious / Ethnic Food Preferences   None       Nutrition Prior to Admission   regular diet, good appetite and PO intake     Nutrition Risk Factors   Moderate Nutrition Risk Factors:  (N/V unknown length of time.)  Acuity Level: 2-Moderate risk        Diet Orders     Diets/Supplements/Feeds   Diet    Diet NPO Except for: Medications     Start Date/Time: 04/11/21 0840      Number of Occurrences: Until Specified     Order Questions:     ??? Except for Medications     % Meal Eaten (last 7 days)  Date/Time Percent of Meal Eaten (%)   04/10/21 1800 51-75  (regular diet)   04/10/21 1300 51-75  (regular diet)        Impression   PO % consumed: 51 to 75%    Impression: Diet tolerated well with good intake,Overweight (BMI 25-29),Pt selecting balanced meals,Pt/family requesting snacks and supplements     Diet Education   No diet education needs at this time      FDI Target Drugs: No          Nutrition Care Plan   Plan: Will provide snacks (Comment),Trend weights,Monitor adequacy of intake,Monitor tolerance to diet,Monitor days NPO         1.  Recommend resume regular diet when medically able  2.  Upon start of diet, snacks to be sent per pt request   -2 PM ([place] sandwich, potato chips, blueberry muffin)   -8 PM (chocolate chip cookie, blueberry muffin, apple juice)      Next Follow-up by 04/18/21    Author:  Fulton Mole, RD, pager [phone]  04/11/2021 12:47 PM

## 2021-04-11 NOTE — Other
Patients Clinical Goal:   Clinical Goal(s) for the Shift: Maintain safety, stable v/s  Identify possible barriers to advancing the care plan: None  Stability of the patient: Moderately Unstable - medium risk of patient condition declining or worsening    End of Shift Summary: Alert & oriented x 4. On RA. Desaturated to 86 %. Placed on O2 2 L NC/min. Non tele. IVF LR continued at 75 cc/hr.NPO post midnight for ERCP  Today. Pain controlled by oxycodone PO and Dilaudid IV. Slept well. Ambulated to bathroom independently.Will endorse to oncoming RN for continuity of care.  GERIATRICS END OF SHIFT - DISCHARGE MARKERS of Instability Checklist  Nursing assessment:     Indicator   Cutoff Check if indicator needs to be addressed (meets cutoff)   Situation/Action/Comment   O2 requirement New since admission []     PO intake Less than 50% []     Urination None in past 8 hours/last shift []     Foley New since admission []     Pain Present  []     Bowel movements  <1 in 2 days or >3 in 24 hours []  04/10/2021   Delirium Unable to follow simple commands or participate with PT/OT []     Mobility Unable to stand and/or walk to bathroom []  OOB to chair x 0  times.  Ambulated to bathroom..       BOOST / SAFE TRANSITION - DISCHARGE INDICATORS    Indicator Check if indicator has red ''P''    Situation/Action/Comment     Problems with Medications  []     Psychological  []     Principal Diagnosis  []     Physical Limitations  []     Poor Health Literacy  []     Patient Support  []     Prior Hospitalizations  []     Palliative Care  []       DELIRIUM PREVENTION  Protocols Strategies Check if implemented Comments   Risk factors >3 present- High risk []     Purposeful orientation Reorient, purposeful orienting conversation  Familiar objects in room []   []     Therapeutic activities Volunteer visit  Cognitive stimulation activities: games, reading, music []   []     Vision & hearing Assistance with: Leisure centre manager  Assistance with: hearing aids/hearing amplifier []   []     Feeding & hydration Assistance with feeding  Assistance with dentures []   []     Sleep hygiene Shades/blinds/lights on during day, limit naps during day  Quiet environment, consolidate care []     Mobilization BMAT 3-4: Ambulate TID  BMAT 2: OOB daily to chair for meals ? 2 hours, each time  BMAT 1: OOB to cardiac chair daily for meals ? 2 hours, each time []   []   []     Pain Non-narcotics ATC  Non-pharmacological: oil/aromatherapy, massage, music []   []     Maintain safety Fall precautions, volunteer visit, family at bedside, tele sitter, constant observer []     Manage agitation Redirect with calm, gentle voice and avoid confrontation  Avoid restraints and use alternative to restraints  Doll, music or animal therapy, as appropriate  Volunteer for companionship if safe and appropriate []   []   []   []       DELIRIUM: CAM (+)  Protocols Strategies Check if implemented Comments   New-onset MD contacted  Delirium order-set initiated  Bladder scan to R/O retention  Assess stool impaction  Medication reviewed with pharmacist []   []   []   []   []  MD Name:   Existing Manage and prevent further  delirium []

## 2021-04-11 NOTE — Nursing Note
Received pt on bed, alert and awake. On RA, saturating 92%. Non tele. IVF LR at 75 cc/hr.No apparent distress at present. Instructed to use call-light for asking help whenever needed. Call-light within reach. Bed alarm on.

## 2021-04-11 NOTE — Consults
CASE MANAGER ASSESSMENT      Admit [id number]    Date of Initial CM Assessment: 04/11/2021    Problems: Active Problems:    Arterial hypotension POA: Yes    Hypotension POA: Yes       Past Medical History:   Diagnosis Date   ??? Anxiety    ??? Depression    ??? Emphysema, unspecified (HCC/RAF)    ??? Emphysema, unspecified (HCC/RAF)    ??? Fibromyalgia    ??? Gastritis    ??? GERD (gastroesophageal reflux disease)    ??? Pancreatitis     Past Surgical History:   Procedure Laterality Date   ??? ABDOMINAL SURGERY     ??? COLON SURGERY            Primary Care Physician:Butler, Josem Kaufmann, MD  [phone]      NEEDS ASSESSMENT:     Level of Function Prior to Admit: Self Care/Indep. W ADLs    Primary Living Situation: Lives w/Family         Pre-admission Living Situation: Home/Apartment                  Primary Support Systems: Children    Contact Name: [name],[name] Daughter [phone]  [phone] Phone Number: [name],[name] Daughter [phone]  [phone]     DPOA?: Yes DPOA Type: Medical     Bathroom on Main Floor: Yes          Prior Treatments / Services: None                                                               Who is your PCP?: Kelle Darting, MD [phone]    Do you have your Primary Care Doctor's office number?: Yes    How often do you visit your doctor?: Annual    Do you need information/education regarding your medical condition?: No       Verbalized financial concerns?: No       Were you hospitalized in the last 30 days?: Yes        READMIT ASSESSMENT: (IF APPLICABLE)     Is this a planned readmission?: No         Interview is with: Patient        DISCHARGE ASSESSMENT:     Projected Date of Discharge: 04/13/2021    Anticipated Complex D/C?: No    Projected Discharge to: Home    Discharge Address: [address] [place] [address]    Projected Discharge Needs: None            Freedom of Choice: Educated and Provided           Support Identified at Discharge: Child  Name of Discharge Support PersonClemetine Marker Daughter [phone]  [phone] Phone Number: Clemetine Marker Daughter [phone]  [phone]     Who is available to transport you upon discharge?: Family Transportation             Darien Ramus,  04/11/2021

## 2021-04-11 NOTE — Other
Patients Clinical Goal:   Clinical Goal(s) for the Shift: safety, skin integrity, pain management  Identify possible barriers to advancing the care plan: intermittent pain  Stability of the patient: Moderately Unstable - medium risk of patient condition declining or worsening    End of Shift Summary: pt a+ox4, bed in lowest position, call light within reach, bed alarm on. C/o pain throughout shift; meds given; refer to [facility]. No BM this shift. Cdiff collection DC'd. Tele DC'd. covid swab pending for this evening for ERCP tomorrow; plan for NPO after midnight. o2 sat 89-92% on RA. LR at 75cc/hr ongoing. DC to home when stable.

## 2021-04-12 LAB — Antinuclear Ab: ANTINUCLEAR AB: <1:40 {titer}

## 2021-04-12 LAB — Comprehensive Metabolic Panel
ALBUMIN: 3.7 g/dL — ABNORMAL LOW (ref 3.9–5.0)
TOTAL CO2: 24 mmol/L (ref 20–30)

## 2021-04-12 LAB — Bacterial Enteric Pathogen Panel: CAMPYLOBACTER SPECIES: NOT DETECTED

## 2021-04-12 LAB — CBC: RED CELL DISTRIBUTION WIDTH-CV: 15.6 — ABNORMAL HIGH (ref 11.1–15.5)

## 2021-04-12 LAB — Differential Automated: EOSINOPHIL PERCENT, AUTO: 0 (ref 0.00–0.04)

## 2021-04-12 MED ADMIN — IPRATROPIUM-ALBUTEROL 20-100 MCG/ACT IN AERS: 1 | RESPIRATORY_TRACT | @ 20:00:00 | Stop: 2021-05-12

## 2021-04-12 MED ADMIN — OXYCODONE HCL 5 MG PO TABS: 5 mg | ORAL | @ 12:00:00 | Stop: 2021-04-14 | NDC 68084035411

## 2021-04-12 MED ADMIN — HYDROMORPHONE HCL 1 MG/ML IJ SOLN: .3 mg | INTRAVENOUS | @ 03:00:00 | Stop: 2021-04-12 | NDC 00409128331

## 2021-04-12 MED ADMIN — QUETIAPINE FUMARATE 100 MG PO TABS: 100 mg | ORAL | @ 06:00:00 | Stop: 2021-04-17 | NDC 00904664061

## 2021-04-12 MED ADMIN — SUGAMMADEX SODIUM 200 MG/2ML IV SOLN: INTRAVENOUS | @ 02:00:00 | Stop: 2021-04-12 | NDC 00006542312

## 2021-04-12 MED ADMIN — EPHEDRINE SULFATE 50 MG/ML IV SOLN: INTRAVENOUS | @ 02:00:00 | Stop: 2021-04-12 | NDC 51754420004

## 2021-04-12 MED ADMIN — INDOMETHACIN 50 MG RE SUPP: 100 mg | RECTAL | @ 01:00:00 | Stop: 2021-04-12 | NDC 69344010233

## 2021-04-12 MED ADMIN — IPRATROPIUM-ALBUTEROL 20-100 MCG/ACT IN AERS: 1 | RESPIRATORY_TRACT | @ 11:00:00 | Stop: 2021-04-15

## 2021-04-12 MED ADMIN — OXYCODONE HCL 5 MG PO TABS: 5 mg | ORAL | @ 17:00:00 | Stop: 2021-04-14 | NDC 68084035411

## 2021-04-12 MED ADMIN — METOCLOPRAMIDE HCL 10 MG PO TABS: 10 mg | ORAL | @ 05:00:00 | Stop: 2021-04-17 | NDC 51079088801

## 2021-04-12 MED ADMIN — SODIUM CHLORIDE 0.9% IV SOLN (250 ML): 10 mL/h | INTRAVENOUS | @ 05:00:00 | Stop: 2021-04-17 | NDC 00338004902

## 2021-04-12 MED ADMIN — HYDROMORPHONE HCL 1 MG/ML IJ SOLN: .5 mg | INTRAVENOUS | @ 13:00:00 | Stop: 2021-04-13 | NDC 00409128331

## 2021-04-12 MED ADMIN — LACTATED RINGERS IV SOLN: INTRAVENOUS | @ 01:00:00 | Stop: 2021-04-12 | NDC 00338011704

## 2021-04-12 MED ADMIN — DEXAMETHASONE SODIUM PHOSPHATE 4 MG/ML IJ SOLN: INTRAVENOUS | @ 01:00:00 | Stop: 2021-04-12 | NDC 67457042312

## 2021-04-12 MED ADMIN — METOCLOPRAMIDE HCL 10 MG PO TABS: 10 mg | ORAL | @ 13:00:00 | Stop: 2021-04-17 | NDC 51079088801

## 2021-04-12 MED ADMIN — METOCLOPRAMIDE HCL 10 MG PO TABS: 10 mg | ORAL | @ 19:00:00 | Stop: 2021-04-17 | NDC 51079088801

## 2021-04-12 MED ADMIN — QUETIAPINE FUMARATE 100 MG PO TABS: 100 mg | ORAL | @ 16:00:00 | Stop: 2021-04-17 | NDC 00904664061

## 2021-04-12 MED ADMIN — IPRATROPIUM-ALBUTEROL 20-100 MCG/ACT IN AERS: 1 | RESPIRATORY_TRACT | @ 20:00:00 | Stop: 2021-04-15 | NDC 00597002402

## 2021-04-12 MED ADMIN — FLUOXETINE HCL 40 MG PO CAPS: 40 mg | ORAL | @ 16:00:00 | Stop: 2021-04-17 | NDC 68084010111

## 2021-04-12 MED ADMIN — HYDROMORPHONE HCL 1 MG/ML IJ SOLN: .5 mg | INTRAVENOUS | @ 05:00:00 | Stop: 2021-04-13 | NDC 00409128331

## 2021-04-12 MED ADMIN — LIDOCAINE HCL (CARDIAC) 100 MG/5ML IV SOSY: INTRAVENOUS | @ 01:00:00 | Stop: 2021-04-12 | NDC 76329339001

## 2021-04-12 MED ADMIN — EPHEDRINE SULFATE 50 MG/ML IV SOLN: INTRAVENOUS | @ 01:00:00 | Stop: 2021-04-12 | NDC 51754420004

## 2021-04-12 MED ADMIN — SUCCINYLCHOLINE CHLORIDE 20 MG/ML IJ SOLN: INTRAVENOUS | @ 01:00:00 | Stop: 2021-04-12 | NDC 00781341195

## 2021-04-12 MED ADMIN — SUCRALFATE 1 GM/10ML PO SUSP: 1 g | ORAL | @ 13:00:00 | Stop: 2021-04-17 | NDC 69339014819

## 2021-04-12 MED ADMIN — MULTI-VITAMINS PO TABS: 1 | ORAL | @ 16:00:00 | Stop: 2021-04-17 | NDC 00904053961

## 2021-04-12 MED ADMIN — ROCURONIUM BROMIDE 50 MG/5ML IV SOLN: INTRAVENOUS | @ 01:00:00 | Stop: 2021-04-12 | NDC 43066000710

## 2021-04-12 MED ADMIN — HYDROMORPHONE HCL 1 MG/ML IJ SOLN: .5 mg | INTRAVENOUS | @ 19:00:00 | Stop: 2021-04-13 | NDC 00409128331

## 2021-04-12 MED ADMIN — LACTATED RINGERS IV BOLUS: 2000 mL | INTRAVENOUS | @ 03:00:00 | Stop: 2021-04-12 | NDC 00338011704

## 2021-04-12 MED ADMIN — PHENYLEPHRINE HCL 10 MG/ML IV SOLN: INTRAVENOUS | @ 01:00:00 | Stop: 2021-04-12 | NDC 00641618810

## 2021-04-12 MED ADMIN — HYDROMORPHONE HCL 1 MG/ML IJ SOLN: .3 mg | INTRAVENOUS | @ 04:00:00 | Stop: 2021-04-12 | NDC 00409128331

## 2021-04-12 MED ADMIN — SUCRALFATE 1 GM/10ML PO SUSP: 1 g | ORAL | @ 06:00:00 | Stop: 2021-04-17

## 2021-04-12 MED ADMIN — PROPOFOL 200 MG/20ML IV EMUL: INTRAVENOUS | @ 01:00:00 | Stop: 2021-04-12 | NDC 63323026929

## 2021-04-12 MED ADMIN — SUCRALFATE 1 GM/10ML PO SUSP: 1 g | ORAL | @ 19:00:00 | Stop: 2021-04-17 | NDC 50268073211

## 2021-04-12 MED ADMIN — TRAZODONE HCL 100 MG PO TABS: 100 mg | ORAL | @ 06:00:00 | Stop: 2021-04-17 | NDC 00904686961

## 2021-04-12 MED ADMIN — FENTANYL CITRATE (PF) 100 MCG/2ML IJ SOLN: INTRAVENOUS | @ 01:00:00 | Stop: 2021-04-12 | NDC 00409909422

## 2021-04-12 MED ADMIN — PANTOPRAZOLE SODIUM 40 MG PO TBEC: 40 mg | ORAL | @ 16:00:00 | Stop: 2021-05-12 | NDC 50268063911

## 2021-04-12 MED ADMIN — ERTAPENEM IVPB: 1 g | INTRAVENOUS | @ 05:00:00 | Stop: 2021-04-12 | NDC 60505619600

## 2021-04-12 MED ADMIN — ONDANSETRON HCL 4 MG/2ML IJ SOLN: INTRAVENOUS | @ 02:00:00 | Stop: 2021-04-12 | NDC 60505613005

## 2021-04-12 NOTE — Other
Patients Clinical Goal:   Clinical Goal(s) for the Shift: npo, safety   Identify possible barriers to advancing the care plan: safe housing placement, pain control  Stability of the patient: Moderately Stable - low risk of patient condition declining or worsening   End of Shift Summary: Pt axo x4, BP 109/76  ~ Pulse 69  ~ Temp 36.6 C (97.9 F) (Axillary)  ~ Resp 18  ~ Wt 67.6 kg (149 lb)  ~ SpO2 (!) 92%  ~ BMI 25.58 kg/m ,  returned from surgery approx 2130, on 2LNC post op, 02 sats 92%, weaned to [facility],  goal to wean off 02 for goal 88-92%,  with intermittent pain to abdomen and shoulder, controlled with Dilauded IVP x2, Oxycodone PO x1, encouraged PO oxycodone for pain control, ambulates to BR with 1 person standby assist, IV fluids completed, 2L urine output over the night, rec'd 2L bolus in recovery, mild nausea controlled with Reglan, tolerating Clears, wants to eat solids during the day, will endorse care to dayshift RN, fall and aspiration precautions in place.

## 2021-04-12 NOTE — Other
Patients Clinical Goal:   Clinical Goal(s) for the Shift: npo, safety  Identify possible barriers to advancing the care plan:   Stability of the patient: Moderately Unstable - medium risk of patient condition declining or worsening    End of Shift Summary: Patient still off the unit , went down for ERCP. Night shift signed off to give ertapenem when back to the floor. IVPB at bedside.Her pain was well controlled

## 2021-04-12 NOTE — Progress Notes
Patient came direct from the floor accompanied by transport., alert and oriented x 4, able to signed consent for Procedure. Skin is dry and intact. No pain noted.

## 2021-04-12 NOTE — Other
Patients Clinical Goal:   Clinical Goal(s) for the Shift: safety, skin integrity, pain management, tolerate POs  Identify possible barriers to advancing the care plan: intermittent pain  Stability of the patient: Moderately Unstable - medium risk of patient condition declining or worsening    End of Shift Summary: pt a+ox4, bed in lowest position, call light within reach, bed alarm on. Frequently c/o pain 8/10 to abdomen, radiating to L chest and neck. Requests IV dilaudid and PO oxycodone Q6hrs. Tolerated clears today; advanced to full liquids for dinner; if tolerates well, will advance to a soft diet tomorrow. Maintained o2 sat above 88%; placed on 1L NC intermittently as sometimes desats to 85% on RA. BMAT 4. Had BM today. Daughter visited. DC to home when stable.
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
## 2021-04-12 NOTE — Nursing Note
Patient transported to GI with x3 and iv LR@75 

## 2021-04-12 NOTE — H&P
UPDATED H&P REQUIREMENT    For [facility] and [name]    WHAT IS THE STATUS OF THE PATIENT'S MOST CURRENT HISTORY AND PHYSICAL?   - The most current H&P is >24 hours and <30 days, and having examined the patient, I attest that there have been no changes. (This suffices as an update to the H&P).      REFER TO MEDICAL STAFF POLICIES REGARDING PRE-PROCEDURE HISTORY AND PHYSICAL EXAMINATION AND UPDATED H&P REQUIREMENTS BELOW:    [facility] and [name] Medical Staff Policy 200 - For Patients Undergoing Procedures Requiring Moderate or Deep Sedation, General Anesthesia or Regional Anesthesia    Contents of a History and Physical Examination (H&P):    The H&P shall consist of chief complaint, history of present illness, allergies and medications, relevant social and family history, past medical history, review of systems and physical examination, and assessment and plan appropriate to the patient's age.    For Patients Undergoing Procedures Requiring Moderate or Deep Sedation, General Anesthesia or Regional Anesthesia:    1. An H&P shall be performed within 24 hours prior to the procedure by a qualified member of the medical staff or designee with appropriate privileges, except as noted in item 2 below.    2. If a complete history and physical was performed within thirty (30) calendar days prior to the patient's admission to the Medical Center for elective surgery, a member of the medical staff assumes the responsibility for the accuracy of the clinical information and will need to document in the medical record within twenty-four (24) hours of admission and prior to surgery or major invasive procedure, that they either attest that the history and physical has been reviewed and accepted, or document an update of the original history and physical relevant to the patient's current  clinical status.    3. Providing an H&P for patients undergoing surgery under local anesthesia is at the discretion of the Attending Physician.     4. When a procedure is performed by a dentist, podiatrist or other practitioner who is not privileged to perform an H&P, the anesthesiologist's assessment immediately prior to the procedure will constitute the 24 hour re-assessment.The dentist, podiatrist or other practitioner who is not privileged to perform an H&P will document the history and physical relevant to the procedure.    5. If the H&P and the written informed consent for the surgery or procedure are not recorded in the patient's medical record prior to surgery, the operation shall not be performed unless the attending physician states in writing that such a delay could lead to an adverse event or irreversible damage to the patient.    6. The above requirements shall not preclude the rendering of emergency medical or surgical care to a patient in dire circumstances.

## 2021-04-12 NOTE — Nursing Note
2135 Pt arrived from recovery via gurney, AxO x4, on 2LNC, 02 sats 92%, BP 122/91  ~ Pulse 77  ~ Temp 36 ???C (96.8 ???F) (Axillary)  ~ Resp 18  ~ Wt 67.6 kg (149 lb)  ~ SpO2 (!) 92%  ~ BMI 25.58 kg/m??? pain manageable at this time, MD Galoosian paged to release orders. Skin intact, skin check done with 2nd RN, Jilu.
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
## 2021-04-12 NOTE — Nursing Note
0800 pt a+ox4, bed in lowest position, call light within reach, bed alarm on. Will continue to monitor.    2878 C/o pain to R sided abdomen, radiating to chest and back. Declines non-pharmacological interventions at this time. States she will sleep until she can get her next dose of pain meds. Ate 100% of clears tray; no N/V. Will continue to monitor.
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
## 2021-04-12 NOTE — Procedures
[facility]ov[facility] report did not transfer to [facility]. Transcribed here:    PATIENT NAME:       Donna Gross, Donna Gross  DATE OF BIRTH:       1966-10-14  RECORD NUMBER:      [id number]  DATE/TIME OF PROCEDURE:     04/11/2021 / 03:45:00 PM  ENDOSCOPIST:       Mechele Claude, MD  REFERRING PHYSICIAN:        FELLOW:           INDICATIONS FOR EXAMINATION:      History of Billroth II; Choledocholithiasis               PROCEDURE PERFORMED:             ENDOSCOPIC RETROGRADE CHOLANGIOPANCREATOGRAPHY - Sphincterotomy  ENDOSCOPIC RETROGRADE CHOLANGIOPANCREATOGRAPHY - with removal of stone(s) from bile duct  ENDOSCOPIC RETROGRADE CHOLANGIOPANCREATOGRAPHY - Endoscopic catheterization of the biliary ductal system, radiological supervision and interpretation  PUSH / SMALL BOWEL ENTEROSCOPY    MEDICATIONS:    Indomethacin 100mg  PR  General anesthesia    PROCEDURE TECHNIQUE:  After the risks/benefits/alternatives of the procedure were explained in detail (including risks of bleeding, perforation, infection, pancreatitis), adequate sedation was induced.  The therapeutic gastroscope with distal cap was advanced through the mouth, down the esophagus, into the small bowel, and up the afferent (pancreaticobiliary limb) of the post-Billroth II anatomy to the cul-de-sac. It was slowly withdrawn until the ampulla was seen    TOTAL WITHDRAWL TIME:    TOTAL INSERTION TIME:      EXTENT OF EXAM:  Bile duct            INSTRUMENTS:     TECHNICAL DIFFICULTY:  No   LIMITATIONS:        TOLERANCE:   Good  VISUALIZATION:  Good    FINDINGS:   Scout films were unremarkable. The esophagus appeared to be normal. No evidence of esophagitis in the esophagus. The stomach had a partial resection with patent gastrojejunostomy, consistnet with Billroth II anatomy. The jejunum and duodenum mucosa appeared normal with no ulcers or erosions. The jejunum appeared normal. The efferent common limb was intubated >50 cm beyond the stomach and appeared normal. The pancreaticobiliary limb was then deeply intubated. The ampulla was identified and appeared to be at the edge of two medium to large periampullary diverticulae.    Biliary findings: The bile duct was selectively cannulated with a sphincterotome and .035'' jagwire with difficulty. Cholangiogram was obtained. The CBD was dilated measuring ~8-9 mm in diameter. A single moderate sized filling defect was seen in the biliary tree. An adequate sphincterotomy was performed using the B2 tome followed by multiple balloon sweeps with removal of a small gold colored stones and stone fragments. A post-stone removal occlusion cholangiogram showed no residual stones and otherwise normal cholangiogram. Cystic duct filling was seen. The pancreatic duct was not opacified. The procedure was then completed.    The procedure was then completed. The pancreatic duct/pancreaticojejunostomy was not entered.    My supervision was provided during the fluoroscopy used during the procedure. The fluoroscopy images of the bile duct anatomy were obtained, reviewed, and interpreted in real time during the procedure.    ESTIMATED BLOOD LOSS:   None     DIAGNOSIS:  1. Successful ERCP in patient with Billroth II anatomy.  2. Choledocholithiasis, s/p sphincterotomy and balloon sweeps with duct clearance as above.    RECOMMENDATIONS:  1. Watch for complications including bleeding, perforation, and pancreatitis.  2. Trend liver  tests.  3. Aggressive post-procedure hydration with lactated ringers (ordered) to help prevent post-ERCP pancreatitis.  4. Recommend surgical consultation for consideration of cholecystectomy to prevent future complications of gallstone disease.          This electronic signature authenticates all electronic and/or handwritten documentation, including orders, generated by the signer during the episode of care contained in this record.  04/11/2021 06:53:18 PM By Mechele Claude

## 2021-04-13 LAB — Differential Automated: BASOPHIL PERCENT, AUTO: 0.4 (ref 0.00–0.04)

## 2021-04-13 LAB — Comprehensive Metabolic Panel
ANION GAP: 14 mmol/L (ref 8–19)
BILIRUBIN,TOTAL: 0.3 mg/dL (ref 0.1–1.2)

## 2021-04-13 LAB — CBC: ABSOLUTE NUCLEATED RBC COUNT: 0 10*3/uL (ref 0.00–0.00)

## 2021-04-13 MED ORDER — NICOTINE 14 MG/24HR TD PT24
1 | MEDICATED_PATCH | Freq: Every day | TRANSDERMAL | PRN
Start: 2021-04-13 — End: ?

## 2021-04-13 MED ORDER — PROMETHAZINE HCL 12.5 MG PO TABS
12.5 mg | ORAL_TABLET | Freq: Four times a day (QID) | ORAL | 0 refills | PRN
Start: 2021-04-13 — End: ?

## 2021-04-13 MED ORDER — ERTAPENEM IVPB
1 g | Freq: Every day | INTRAVENOUS | 0 refills | 1.00000 days
Start: 2021-04-13 — End: ?

## 2021-04-13 MED ADMIN — IPRATROPIUM-ALBUTEROL 20-100 MCG/ACT IN AERS: 1 | RESPIRATORY_TRACT | @ 06:00:00 | Stop: 2021-04-15 | NDC 00597002402

## 2021-04-13 MED ADMIN — OXYCODONE HCL 5 MG PO TABS: 5 mg | ORAL | @ 16:00:00 | Stop: 2021-04-14 | NDC 68084035411

## 2021-04-13 MED ADMIN — OXYCODONE HCL 5 MG PO TABS: 5 mg | ORAL | @ 05:00:00 | Stop: 2021-04-14 | NDC 68084035411

## 2021-04-13 MED ADMIN — HYDROMORPHONE HCL 1 MG/ML IJ SOLN: .5 mg | INTRAVENOUS | @ 19:00:00 | Stop: 2021-04-15 | NDC 00409128331

## 2021-04-13 MED ADMIN — HYDROMORPHONE HCL 1 MG/ML IJ SOLN: .5 mg | INTRAVENOUS | @ 02:00:00 | Stop: 2021-04-13 | NDC 00409128331

## 2021-04-13 MED ADMIN — ERTAPENEM IVPB: 1 g | INTRAVENOUS | Stop: 2021-04-17 | NDC 60505619600

## 2021-04-13 MED ADMIN — SUCRALFATE 1 GM/10ML PO SUSP: 1 g | ORAL | @ 19:00:00 | Stop: 2021-05-12 | NDC 50268073211

## 2021-04-13 MED ADMIN — SUCRALFATE 1 GM/10ML PO SUSP: 1 g | ORAL | @ 03:00:00 | Stop: 2021-05-12 | NDC 69339014819

## 2021-04-13 MED ADMIN — QUETIAPINE FUMARATE 100 MG PO TABS: 100 mg | ORAL | @ 17:00:00 | Stop: 2021-04-17 | NDC 00904664061

## 2021-04-13 MED ADMIN — MULTI-VITAMINS PO TABS: 1 | ORAL | @ 16:00:00 | Stop: 2021-04-17 | NDC 00904053961

## 2021-04-13 MED ADMIN — LACTATED RINGERS IV BOLUS: 500 mL | INTRAVENOUS | @ 21:00:00 | Stop: 2021-04-13 | NDC 00338011704

## 2021-04-13 MED ADMIN — METOCLOPRAMIDE HCL 10 MG PO TABS: 10 mg | ORAL | @ 19:00:00 | Stop: 2021-05-11 | NDC 51079088801

## 2021-04-13 MED ADMIN — ONDANSETRON HCL 4 MG/2ML IJ SOLN: 4 mg | INTRAVENOUS | @ 05:00:00 | Stop: 2021-04-17 | NDC 60505613000

## 2021-04-13 MED ADMIN — FLUOXETINE HCL 40 MG PO CAPS: 40 mg | ORAL | @ 16:00:00 | Stop: 2021-04-17 | NDC 68084010111

## 2021-04-13 MED ADMIN — METOCLOPRAMIDE HCL 10 MG PO TABS: 10 mg | ORAL | @ 13:00:00 | Stop: 2021-04-17 | NDC 51079088801

## 2021-04-13 MED ADMIN — HYDROMORPHONE HCL 1 MG/ML IJ SOLN: .5 mg | INTRAVENOUS | @ 13:00:00 | Stop: 2021-04-13 | NDC 00409128331

## 2021-04-13 MED ADMIN — IPRATROPIUM-ALBUTEROL 20-100 MCG/ACT IN AERS: 1 | RESPIRATORY_TRACT | @ 14:00:00 | Stop: 2021-04-15

## 2021-04-13 MED ADMIN — IPRATROPIUM-ALBUTEROL 20-100 MCG/ACT IN AERS: 1 | RESPIRATORY_TRACT | @ 01:00:00 | Stop: 2021-05-12 | NDC 00597002402

## 2021-04-13 MED ADMIN — SUCRALFATE 1 GM/10ML PO SUSP: 1 g | ORAL | @ 13:00:00 | Stop: 2021-04-17 | NDC 50268073211

## 2021-04-13 MED ADMIN — IPRATROPIUM-ALBUTEROL 20-100 MCG/ACT IN AERS: 1 | RESPIRATORY_TRACT | @ 20:00:00 | Stop: 2021-04-15 | NDC 00597002402

## 2021-04-13 MED ADMIN — QUETIAPINE FUMARATE 100 MG PO TABS: 100 mg | ORAL | @ 03:00:00 | Stop: 2021-04-17 | NDC 00904664061

## 2021-04-13 MED ADMIN — TRAZODONE HCL 100 MG PO TABS: 100 mg | ORAL | @ 03:00:00 | Stop: 2021-04-17 | NDC 00904686961

## 2021-04-13 MED ADMIN — PANTOPRAZOLE SODIUM 40 MG PO TBEC: 40 mg | ORAL | @ 16:00:00 | Stop: 2021-04-17 | NDC 50268063911

## 2021-04-13 MED ADMIN — METOCLOPRAMIDE HCL 10 MG PO TABS: 10 mg | ORAL | Stop: 2021-04-17 | NDC 51079088801

## 2021-04-13 NOTE — Nursing Note
1920  Rounding on patient during shift change.  No s/s of distress.  Call light in reach.  Noted hydromorphone given at 1834, pt states pain coming down, now down to a 7/10.  Discussed with pt that oxycodone available later if needed.  Will continue to monitor.    1940  Evening medications given now per patient request.

## 2021-04-13 NOTE — Other
Patients Clinical Goal:   Clinical Goal(s) for the Shift: Safety, pain management, no SOB, good sleep  Identify possible barriers to advancing the care plan: Still with nausea  Stability of the patient: Moderately Stable - low risk of patient condition declining or worsening   End of Shift Summary: Patient mostly sleeping overnight. O2 sats dropped to low to mid 80's on room air, O2 sats maintained on 1-1.5 L N/C with O2 sats 88-91%.  Otherwise V/S stable.  No s/s of distress.  Pain controlled with x1 PRN oxycodone and x1 PRN hydromorphone IV.  One PRN dose of Zofran given for nausea overnight.  Pt had x1 soft stool last night.   Pt had slight bloody nose this AM.  Nasal cannula switched to humidified to prevent nose bleed.  Call light in reach.  Bed alarm on.    GERIATRICS END OF SHIFT - DISCHARGE MARKERS of Instability Checklist  Nursing assessment:     Indicator   Cutoff Check if indicator needs to be addressed (meets cutoff)   Situation/Action/Comment   O2 requirement New since admission []     PO intake Less than 50% []     Urination None in past 8 hours/last shift []     Foley New since admission []     Pain Present  []     Bowel movements  <1 in 2 days or >3 in 24 hours []     Delirium Unable to follow simple commands or participate with PT/OT []     Mobility Unable to stand and/or walk to bathroom []  OOB to chair 0 times.  Ambulated 0 feet.       BOOST / SAFE TRANSITION - DISCHARGE INDICATORS    Indicator Check if indicator has red ''P''    Situation/Action/Comment     Problems with Medications  []     Psychological  []     Principal Diagnosis  []     Physical Limitations  []     Poor Health Literacy  []     Patient Support  []     Prior Hospitalizations  []     Palliative Care  []       DELIRIUM PREVENTION  Protocols Strategies Check if implemented Comments   Risk factors >3 present- High risk []     Purposeful orientation Reorient, purposeful orienting conversation  Familiar objects in room []   []     Therapeutic activities Volunteer visit  Cognitive stimulation activities: games, reading, music []   []     Vision & hearing Assistance with: Leisure centre manager  Assistance with: hearing aids/hearing amplifier []   []     Feeding & hydration Assistance with feeding  Assistance with dentures []   []     Sleep hygiene Shades/blinds/lights on during day, limit naps during day  Quiet environment, consolidate care []     Mobilization BMAT 3-4: Ambulate TID  BMAT 2: OOB daily to chair for meals ? 2 hours, each time  BMAT 1: OOB to cardiac chair daily for meals ? 2 hours, each time []   []   []     Pain Non-narcotics ATC  Non-pharmacological: oil/aromatherapy, massage, music []   []     Maintain safety Fall precautions, volunteer visit, family at bedside, tele sitter, constant observer []     Manage agitation Redirect with calm, gentle voice and avoid confrontation  Avoid restraints and use alternative to restraints  Doll, music or animal therapy, as appropriate  Volunteer for companionship if safe and appropriate []   []   []   []       DELIRIUM: CAM (+)  Protocols Strategies Check if  implemented Comments   New-onset MD contacted  Delirium order-set initiated  Bladder scan to R/O retention  Assess stool impaction  Medication reviewed with pharmacist []  []  []  []  [] MD Name:   Existing Manage and prevent further delirium []

## 2021-04-13 NOTE — Nursing Note
0800 pt a+ox4, bed in lowest position, call light within reach, bed alarm on. Denies pain or discomfort at this time, will continue to monitor.    7473 c/o abdominal pain. PO Oxycodone given; will continue to monitor. Explained diet advanced to mech soft; menu given.    1000 rounded with MD Kizzie Ide.
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
## 2021-04-14 LAB — CBC: PLATELET COUNT, AUTO: 352 10*3/uL (ref 143–398)

## 2021-04-14 LAB — Blood Culture Detection
BLOOD CULTURE FINAL STATUS: NEGATIVE
BLOOD CULTURE FINAL STATUS: NEGATIVE
BLOOD CULTURE PRELIMINARY STATUS: NEGATIVE

## 2021-04-14 LAB — Comprehensive Metabolic Panel
ALBUMIN: 3.6 g/dL — ABNORMAL LOW (ref 3.9–5.0)
BILIRUBIN,TOTAL: <0.2 mg/dL (ref 0.1–1.2)

## 2021-04-14 LAB — Lipase: LIPASE: 40 U/L (ref 13–69)

## 2021-04-14 LAB — COVID-19 PCR: COVID-19 PCR/TMA: NOT DETECTED

## 2021-04-14 LAB — Differential Automated: LYMPHOCYTE PERCENT, AUTO: 50.3 (ref 1.30–3.40)

## 2021-04-14 MED ORDER — OXYCODONE HCL 5 MG PO TABS
5 mg | ORAL | 0 refills | PRN
Start: 2021-04-14 — End: ?

## 2021-04-14 MED ORDER — NICOTINE 14 MG/24HR TD PT24
1 | MEDICATED_PATCH | Freq: Every day | TRANSDERMAL | PRN
Start: 2021-04-14 — End: ?

## 2021-04-14 MED ORDER — ERTAPENEM IVPB
1 g | Freq: Every day | INTRAVENOUS | 0 refills | 1.00000 days
Start: 2021-04-14 — End: ?

## 2021-04-14 MED ORDER — PROMETHAZINE HCL 12.5 MG PO TABS
12.5 mg | ORAL_TABLET | Freq: Four times a day (QID) | ORAL | 0 refills | PRN
Start: 2021-04-14 — End: ?

## 2021-04-14 MED ADMIN — HYDROMORPHONE HCL 1 MG/ML IJ SOLN: .5 mg | INTRAVENOUS | @ 04:00:00 | Stop: 2021-04-15 | NDC 00409128331

## 2021-04-14 MED ADMIN — PANTOPRAZOLE SODIUM 40 MG PO TBEC: 40 mg | ORAL | @ 15:00:00 | Stop: 2021-04-17 | NDC 50268063911

## 2021-04-14 MED ADMIN — TRAZODONE HCL 100 MG PO TABS: 100 mg | ORAL | @ 04:00:00 | Stop: 2021-04-17 | NDC 60687045411

## 2021-04-14 MED ADMIN — ONDANSETRON HCL 4 MG/2ML IJ SOLN: 4 mg | INTRAVENOUS | @ 16:00:00 | Stop: 2021-04-17 | NDC 60505613000

## 2021-04-14 MED ADMIN — QUETIAPINE FUMARATE 100 MG PO TABS: 100 mg | ORAL | @ 04:00:00 | Stop: 2021-05-12 | NDC 00904664061

## 2021-04-14 MED ADMIN — HYDROMORPHONE HCL 1 MG/ML IJ SOLN: .5 mg | INTRAVENOUS | @ 15:00:00 | Stop: 2021-04-15 | NDC 00409128331

## 2021-04-14 MED ADMIN — MULTI-VITAMINS PO TABS: 1 | ORAL | @ 15:00:00 | Stop: 2021-04-17 | NDC 00904053961

## 2021-04-14 MED ADMIN — HYDROMORPHONE HCL 1 MG/ML IJ SOLN: .5 mg | INTRAVENOUS | Stop: 2021-04-15 | NDC 00409128331

## 2021-04-14 MED ADMIN — QUETIAPINE FUMARATE 100 MG PO TABS: 100 mg | ORAL | @ 15:00:00 | Stop: 2021-04-17 | NDC 00904664061

## 2021-04-14 MED ADMIN — OXYCODONE HCL 5 MG PO TABS: 5 mg | ORAL | @ 13:00:00 | Stop: 2021-04-14 | NDC 68084035411

## 2021-04-14 MED ADMIN — LACTATED RINGERS IV BOLUS: 500 mL | INTRAVENOUS | @ 07:00:00 | Stop: 2021-04-14

## 2021-04-14 MED ADMIN — ERTAPENEM IVPB: 1 g | INTRAVENOUS | @ 01:00:00 | Stop: 2021-04-17 | NDC 60505619600

## 2021-04-14 MED ADMIN — SUCRALFATE 1 GM/10ML PO SUSP: 1 g | ORAL | @ 19:00:00 | Stop: 2021-05-12 | NDC 50268073211

## 2021-04-14 MED ADMIN — SUCRALFATE 1 GM/10ML PO SUSP: 1 g | ORAL | @ 13:00:00 | Stop: 2021-04-17 | NDC 50268073211

## 2021-04-14 MED ADMIN — IPRATROPIUM-ALBUTEROL 20-100 MCG/ACT IN AERS: 1 | RESPIRATORY_TRACT | @ 05:00:00 | Stop: 2021-05-12 | NDC 00597002402

## 2021-04-14 MED ADMIN — HYDROMORPHONE HCL 1 MG/ML IJ SOLN: .5 mg | INTRAVENOUS | @ 18:00:00 | Stop: 2021-04-14 | NDC 00409128331

## 2021-04-14 MED ADMIN — METOCLOPRAMIDE HCL 10 MG PO TABS: 10 mg | ORAL | @ 13:00:00 | Stop: 2021-04-17 | NDC 51079088801

## 2021-04-14 MED ADMIN — IPRATROPIUM-ALBUTEROL 20-100 MCG/ACT IN AERS: 1 | RESPIRATORY_TRACT | @ 19:00:00 | Stop: 2021-04-15 | NDC 00597002402

## 2021-04-14 MED ADMIN — IPRATROPIUM-ALBUTEROL 20-100 MCG/ACT IN AERS: 1 | RESPIRATORY_TRACT | @ 02:00:00 | Stop: 2021-04-15 | NDC 00597002402

## 2021-04-14 MED ADMIN — METOCLOPRAMIDE HCL 10 MG PO TABS: 10 mg | ORAL | @ 19:00:00 | Stop: 2021-05-11 | NDC 51079088801

## 2021-04-14 MED ADMIN — FLUOXETINE HCL 40 MG PO CAPS: 40 mg | ORAL | @ 15:00:00 | Stop: 2021-04-17 | NDC 68084010111

## 2021-04-14 MED ADMIN — METOCLOPRAMIDE HCL 10 MG PO TABS: 10 mg | ORAL | @ 01:00:00 | Stop: 2021-04-17 | NDC 51079088801

## 2021-04-14 MED ADMIN — METOCLOPRAMIDE HCL 10 MG PO TABS: 10 mg | ORAL | Stop: 2021-04-17 | NDC 51079088801

## 2021-04-14 MED ADMIN — ERTAPENEM IVPB: 1 g | INTRAVENOUS | Stop: 2021-04-17 | NDC 60505619600

## 2021-04-14 MED ADMIN — IPRATROPIUM-ALBUTEROL 20-100 MCG/ACT IN AERS: 1 | RESPIRATORY_TRACT | @ 12:00:00 | Stop: 2021-04-15 | NDC 00597002402

## 2021-04-14 MED ADMIN — LACTATED RINGERS IV BOLUS: 500 mL | INTRAVENOUS | @ 19:00:00 | Stop: 2021-04-14 | NDC 00338011704

## 2021-04-14 MED ADMIN — SUCRALFATE 1 GM/10ML PO SUSP: 1 g | ORAL | @ 04:00:00 | Stop: 2021-04-17 | NDC 50268073211
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
## 2021-04-14 NOTE — Procedures
PATIENT NAME:       Donna Gross, Donna Gross  DATE OF BIRTH:       May 16, 1966  RECORD NUMBER:      [id number]  DATE/TIME OF PROCEDURE:     04/11/2021 / 03:45:00 PM  ENDOSCOPIST:       Mechele Claude, MD  REFERRING PHYSICIAN:        FELLOW:           INDICATIONS FOR EXAMINATION:      History of Billroth II; Choledocholithiasis               PROCEDURE PERFORMED:             ENDOSCOPIC RETROGRADE CHOLANGIOPANCREATOGRAPHY - Sphincterotomy  ENDOSCOPIC RETROGRADE CHOLANGIOPANCREATOGRAPHY - with removal of stone(s) from bile duct  ENDOSCOPIC RETROGRADE CHOLANGIOPANCREATOGRAPHY - Endoscopic catheterization of the biliary ductal system, radiological supervision and interpretation  PUSH / SMALL BOWEL ENTEROSCOPY    MEDICATIONS:    Indomethacin 100mg  PR  General anesthesia    PROCEDURE TECHNIQUE:  After the risks/benefits/alternatives of the procedure were explained in detail (including risks of bleeding, perforation, infection, pancreatitis), adequate sedation was induced.  The therapeutic gastroscope with distal cap was advanced through the   mouth, down the esophagus, into the small bowel, and up the afferent (pancreaticobiliary limb) of the post-Billroth II anatomy to the cul-de-sac. It was slowly withdrawn until the ampulla was seen    TOTAL WITHDRAWL TIME:    TOTAL INSERTION TIME:      EXTENT OF EXAM:  Bile duct            INSTRUMENTS:     TECHNICAL DIFFICULTY:  No   LIMITATIONS:        TOLERANCE:   Good  VISUALIZATION:  Good    FINDINGS:   Scout films were unremarkable. The esophagus appeared to be normal. No evidence of esophagitis in the esophagus. The stomach had a partial resection with patent gastrojejunostomy, consistnet with Billroth II anatomy. The jejunum and duodenum mucosa   appeared normal with no ulcers or erosions. The jejunum appeared normal. The efferent common limb was intubated >50 cm beyond the stomach and appeared normal. The pancreaticobiliary limb was then deeply intubated. The ampulla was identified and appeared to be at the edge of two medium to large periampullary diverticulae.    Biliary findings: The bile duct was selectively cannulated with a sphincterotome and .035'' jagwire with difficulty. Cholangiogram was obtained. The CBD was dilated measuring 8-9 mm in diameter. A single moderate sized filling defect was seen in the   biliary tree. An adequate sphincterotomy was performed using the B2 tome followed by multiple balloon sweeps with removal of a small gold colored stones and stone fragments. A post-stone removal occlusion cholangiogram showed no residual stones and   otherwise normal cholangiogram. Cystic duct filling was seen. The pancreatic duct was not opacified. The procedure was then completed.    The procedure was then completed. The pancreatic duct/pancreaticojejunostomy was not entered.    My supervision was provided during the fluoroscopy used during the procedure. The fluoroscopy images of the bile duct anatomy were obtained, reviewed, and interpreted in real time during the procedure.    ESTIMATED BLOOD LOSS:   None     DIAGNOSIS:  1. Successful ERCP in patient with Billroth II anatomy.  2. Choledocholithiasis, s/p sphincterotomy and balloon sweeps with duct clearance as above.    RECOMMENDATIONS:  1. Watch for complications including bleeding, perforation, and pancreatitis.  2. Trend liver tests.  3. Aggressive  post-procedure hydration with lactated ringers (ordered) to help prevent post-ERCP pancreatitis.  4. Recommend surgical consultation for consideration of cholecystectomy to prevent future complications of gallstone disease.          This electronic signature authenticates all electronic and/or handwritten documentation, including orders, generated by the signer during the episode of care contained in this record.  04/11/2021 06:53:18 PM By Mechele Claude

## 2021-04-14 NOTE — Other
Patients Clinical Goal:   Clinical Goal(s) for the Shift: safety, skin integrity, pain management, advance diet as tolerated  Identify possible barriers to advancing the care plan: intermittent pain  Stability of the patient: Moderately Unstable - medium risk of patient condition declining or worsening    End of Shift Summary: pt a+ox4, bed in lowest position, call light within reach. Bed alarm on. C/o abdominal and neck pain intermittently. Received IV dilaudid and PO oxycodone. Tolerating mech soft diet. No n/v. SBP low, into 80s. 500cc LR bolus given; resolved. No BM this shift. DC to home with daughter when stable.

## 2021-04-14 NOTE — Consults
IP CM ACTIVE DISCHARGE PLANNING  Department of Care Coordination      Admit UEAV:[id number]  Anticipated Date of Discharge: 04/14/2021    Following BJ:YNWG, Grayling Congress., MD    UPDATE:   per patient she was authed at [facility]. CM sent referral to [facility] via [facility]- pending response. CM called [facility] - talked to Dorene Sorrow- verified referral receipt via [employer]- will response if able to accept referral    Today's short update     per Melton Krebs at [facility]: able to accept the patient. CM sent a request to confirm bed assignment at [facility] via [facility]- pending response    Ronnie Doss de [place],  04/14/2021   Karren Cobble la [place] RN-BSN  COVERING Weekend Per Diem Case Manager  Department of Care Coordination and Clinical Social Work  Mobile# [phone]  pager ID number: [phone]
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
## 2021-04-14 NOTE — Other
Patients Clinical Goal:   Clinical Goal(s) for the Shift: Safety, stable v/s, no SOB, pain management, good sleep  Identify possible barriers to advancing the care plan: O2 needs at night, hypotensive episode overnight  Stability of the patient: Moderately Stable - low risk of patient condition declining or worsening   End of Shift Summary: Patient mostly sleeping overnight.  Last night, B/P dropped to  82/39, rechecked at 90/54.   500 LR bolus given and b/p came up to 121/68.  Otherwise V/S stable.   Pain controlled with x1 dose  of hydromorphone last night and x1 dose of oxycodone this morning.  No PRN needed/given for nausea.   Pt had  x1 large loose BM this morning.   AM labs pending.   Call light in reach.   Bed alarm on.      GERIATRICS END OF SHIFT - DISCHARGE MARKERS of Instability Checklist  Nursing assessment:     Indicator   Cutoff Check if indicator needs to be addressed (meets cutoff)   Situation/Action/Comment   O2 requirement New since admission []     PO intake Less than 50% []     Urination None in past 8 hours/last shift []     Foley New since admission []     Pain Present  []     Bowel movements  <1 in 2 days or >3 in 24 hours []     Delirium Unable to follow simple commands or participate with PT/OT []     Mobility Unable to stand and/or walk to bathroom []  OOB to chair 0 times.  Ambulated 0 feet.       BOOST / SAFE TRANSITION - DISCHARGE INDICATORS    Indicator Check if indicator has red ''P''    Situation/Action/Comment     Problems with Medications  []     Psychological  []     Principal Diagnosis  []     Physical Limitations  []     Poor Health Literacy  []     Patient Support  []     Prior Hospitalizations  []     Palliative Care  []       DELIRIUM PREVENTION  Protocols Strategies Check if implemented Comments   Risk factors >3 present- High risk []     Purposeful orientation Reorient, purposeful orienting conversation  Familiar objects in room []   []     Therapeutic activities Volunteer visit  Cognitive stimulation activities: games, reading, music []   []     Vision & hearing Assistance with: Leisure centre manager  Assistance with: hearing aids/hearing amplifier []   []     Feeding & hydration Assistance with feeding  Assistance with dentures []   []     Sleep hygiene Shades/blinds/lights on during day, limit naps during day  Quiet environment, consolidate care []     Mobilization BMAT 3-4: Ambulate TID  BMAT 2: OOB daily to chair for meals ? 2 hours, each time  BMAT 1: OOB to cardiac chair daily for meals ? 2 hours, each time []   []   []     Pain Non-narcotics ATC  Non-pharmacological: oil/aromatherapy, massage, music []   []     Maintain safety Fall precautions, volunteer visit, family at bedside, tele sitter, constant observer []     Manage agitation Redirect with calm, gentle voice and avoid confrontation  Avoid restraints and use alternative to restraints  Doll, music or animal therapy, as appropriate  Volunteer for companionship if safe and appropriate []   []   []   []       DELIRIUM: CAM (+)  Protocols Strategies Check if implemented Comments  New-onset MD contacted  Delirium order-set initiated  Bladder scan to R/O retention  Assess stool impaction  Medication reviewed with pharmacist []   []   []   []   []  MD Name:   Existing Manage and prevent further delirium []

## 2021-04-14 NOTE — Nursing Note
2330 pt hypotensive, asymptomatic, 82/55 (65) HR 64, then laid flat, went up to 90/54(66) HR 63, paged covering MD Jandali information, pending return call. Pt resting in no distress or pain.

## 2021-04-14 NOTE — Nursing Note
1910  Rounding on patient during shift change.  No s/s of distress.  Call light in reach.  Bed alarm on.    2325  Noted low b/p 82/55.  Covering RN Trula Ore to notify covering MD.      2345  Bolus infusing at 250 ml/hr to avoid IV infiltration.  Pt remains asymptomatic.    0150  B/P 121/68 after bolus completed.

## 2021-04-14 NOTE — Nursing Note
0981: received pt in bed, pt aox4, on 1L 02 via NC. Pt reports having pain in the abd, given IVP Dilaudid, BP stable at 126/85. POC reviewed. Call light within reach. Bed alarm on.     1043: BP 90/58, was checking before the pain med, this RN note able to give pain med due to low BP. pt is asymptomatic.. MD Kizzie Ide paged. Pt encouraged to drink more fluids.     1053: MD called back, ok to give pain meds, MD to order IVF.     1101: BP 118/62, pt given IVP dilaudid as ordered.     1208: started LR bolus X started as ordered.

## 2021-04-15 LAB — Differential Automated: BASOPHIL PERCENT, AUTO: 0.4 (ref 1.30–3.40)

## 2021-04-15 LAB — Comprehensive Metabolic Panel
SODIUM: 143 mmol/L (ref 135–146)
TOTAL PROTEIN: 6.5 g/dL (ref 6.1–8.2)

## 2021-04-15 LAB — CBC: HEMATOCRIT: 31 — ABNORMAL LOW (ref 34.9–45.2)

## 2021-04-15 MED ORDER — PROMETHAZINE HCL 12.5 MG PO TABS
12.5 mg | ORAL_TABLET | Freq: Four times a day (QID) | ORAL | 0 refills | PRN
Start: 2021-04-15 — End: ?

## 2021-04-15 MED ORDER — ERTAPENEM IVPB
1 g | Freq: Every day | INTRAVENOUS | 0 refills | 1.00000 days
Start: 2021-04-15 — End: ?

## 2021-04-15 MED ORDER — OXYCODONE HCL 5 MG PO TABS
5 mg | ORAL | 0 refills | PRN
Start: 2021-04-15 — End: ?

## 2021-04-15 MED ADMIN — METOCLOPRAMIDE HCL 10 MG PO TABS: 10 mg | ORAL | @ 14:00:00 | Stop: 2021-04-17 | NDC 51079088801

## 2021-04-15 MED ADMIN — QUETIAPINE FUMARATE 100 MG PO TABS: 100 mg | ORAL | @ 17:00:00 | Stop: 2021-04-17 | NDC 00904664061

## 2021-04-15 MED ADMIN — IPRATROPIUM BROMIDE 0.02 % IN SOLN: 500 ug | RESPIRATORY_TRACT | @ 17:00:00 | Stop: 2021-04-17 | NDC 00487980101

## 2021-04-15 MED ADMIN — IPRATROPIUM BROMIDE 0.02 % IN SOLN: 500 ug | RESPIRATORY_TRACT | @ 21:00:00 | Stop: 2021-04-17 | NDC 00487980101

## 2021-04-15 MED ADMIN — ALBUTEROL SULFATE 2.5 MG/0.5ML IN NEBU: 5 mg | RESPIRATORY_TRACT | @ 21:00:00 | Stop: 2021-04-16 | NDC 00487990130

## 2021-04-15 MED ADMIN — METOCLOPRAMIDE HCL 10 MG PO TABS: 10 mg | ORAL | @ 23:00:00 | Stop: 2021-04-17 | NDC 51079088801

## 2021-04-15 MED ADMIN — SUCRALFATE 1 GM/10ML PO SUSP: 1 g | ORAL | @ 03:00:00 | Stop: 2021-04-17 | NDC 50268073211

## 2021-04-15 MED ADMIN — METOCLOPRAMIDE HCL 10 MG PO TABS: 10 mg | ORAL | @ 18:00:00 | Stop: 2021-04-17 | NDC 51079088801

## 2021-04-15 MED ADMIN — IPRATROPIUM-ALBUTEROL 20-100 MCG/ACT IN AERS: 1 | RESPIRATORY_TRACT | @ 14:00:00 | Stop: 2021-04-15

## 2021-04-15 MED ADMIN — QUETIAPINE FUMARATE 100 MG PO TABS: 100 mg | ORAL | @ 03:00:00 | Stop: 2021-05-12 | NDC 00904664061

## 2021-04-15 MED ADMIN — SUCRALFATE 1 GM/10ML PO SUSP: 1 g | ORAL | @ 18:00:00 | Stop: 2021-05-12 | NDC 50268073211

## 2021-04-15 MED ADMIN — ALBUTEROL SULFATE 2.5 MG/0.5ML IN NEBU: 5 mg | RESPIRATORY_TRACT | @ 17:00:00 | Stop: 2021-04-16 | NDC 00487990130

## 2021-04-15 MED ADMIN — ERTAPENEM IVPB: 1 g | INTRAVENOUS | @ 23:00:00 | Stop: 2021-04-17 | NDC 60505619600

## 2021-04-15 MED ADMIN — TRAZODONE HCL 100 MG PO TABS: 100 mg | ORAL | @ 03:00:00 | Stop: 2021-04-17 | NDC 60687045411

## 2021-04-15 MED ADMIN — HYDROMORPHONE HCL 1 MG/ML IJ SOLN: .5 mg | INTRAVENOUS | @ 21:00:00 | Stop: 2021-04-17 | NDC 00409128331

## 2021-04-15 MED ADMIN — OXYCODONE HCL 5 MG PO TABS: 5 mg | ORAL | @ 03:00:00 | Stop: 2021-04-17 | NDC 68084035411

## 2021-04-15 MED ADMIN — HYDROMORPHONE HCL 1 MG/ML IJ SOLN: .5 mg | INTRAVENOUS | @ 14:00:00 | Stop: 2021-04-15 | NDC 00409128331

## 2021-04-15 MED ADMIN — IPRATROPIUM-ALBUTEROL 20-100 MCG/ACT IN AERS: 1 | RESPIRATORY_TRACT | Stop: 2021-05-12 | NDC 00597002402

## 2021-04-15 MED ADMIN — OXYCODONE HCL 5 MG PO TABS: 5 mg | ORAL | @ 18:00:00 | Stop: 2021-04-17 | NDC 68084035411

## 2021-04-15 MED ADMIN — PANTOPRAZOLE SODIUM 40 MG PO TBEC: 40 mg | ORAL | @ 17:00:00 | Stop: 2021-04-17 | NDC 50268063911

## 2021-04-15 MED ADMIN — FLUOXETINE HCL 40 MG PO CAPS: 40 mg | ORAL | @ 17:00:00 | Stop: 2021-04-17 | NDC 68084010111

## 2021-04-15 MED ADMIN — SUCRALFATE 1 GM/10ML PO SUSP: 1 g | ORAL | @ 14:00:00 | Stop: 2021-04-17 | NDC 50268073211

## 2021-04-15 MED ADMIN — IPRATROPIUM-ALBUTEROL 20-100 MCG/ACT IN AERS: 1 | RESPIRATORY_TRACT | @ 08:00:00 | Stop: 2021-04-15

## 2021-04-15 MED ADMIN — MULTI-VITAMINS PO TABS: 1 | ORAL | @ 17:00:00 | Stop: 2021-04-17 | NDC 00904053961

## 2021-04-15 NOTE — Consults
FINAL DISCHARGE MULTIDISCIPLINARY NOTE  Department of Care Coordination      Admit NFAO:[id number]  Anticipated Date of Discharge: 04/16/2021  Following HQ:IONG, Grayling Congress., MD    Home [address]  [place] [address]    Per MD, patient is medically stable for DC to SNF-Rehab today.  Patient/family (daughter) updated and is agreeable with DC Plan.    See below for details of DC Plan    DISCHARGE INFORMATION:     Discharge Address: HOME ADDRESS:   [address] [place] [address]    Individual(s) notified of discharge plan:  Contact Name: [name] [name] Relationship: Daughter   Contact Number(s): [phone]      Is patient/family informed of discharge?: Yes Is patient/family agreeable of discharge destination?: Yes     Support Systems: Family       Medicare Important Message Provided: Not Applicable             Freedom of Choice: Educated and Provided       Final Discharge Needs: Facility Printmaker (if applicable):   Accepting Facility - Level of Care: Extended Care Facility  SNF Facility Address :  [facility]: [facility]  Accepting Facility Address:   [address] [place],   [place]   [address]  Contact Person: ADMISSIONS COORDINATOR  Phone Number: [phone]  Fax Number: [phone]  Accepting MD: Marlyn Corporal, MD  Accepting MD Number: [id number]  Chart Copied?: Yes  Films Copied?: Not Required  Physician to Physician Communication Completed: Yes  Bedside Nurse to Accepting Nurse Communication Completed: Yes  Copy of the Interfacility Report given to patient/designee?: Yes  Comments: ROOM/BED 14-2;  FOR RN TO RN REPORT, PLEASE CALL:  [phone]     Homeless Discharge (if applicable):   Primary Living Situation: Lives w/Family    Transportation Arrangements (if applicable):   Transfer Date: Placed on Will Call  Time:    Transportation Type: Basic life support vehicle  Transport Company: [employer]  Phone Number: [phone]  Comments:CM to call back to confirm PU time    Other Arrangements (if applicable):    n/a    [facility] Lucill Mauck,  04/16/2021  Herbie Saxon, RN, BSN, MHA Pager: [phone]  Clinical Case Manager, Department of Care Coordination and Clinical Social Work

## 2021-04-15 NOTE — Nursing Note
0800 Pt in bed, AOx4, on 3lpm o2 per NC. Denies pain and discomfort a this time. Call light within reach.Fall precaution observed.    1100 Lowered o2 to 2lpm, saturating at 94%    1340 Pt c/o abdominal pain. IV dilaudid adminsitered. BP 111/69
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
## 2021-04-15 NOTE — Other
Patients Clinical Goal:   Clinical Goal(s) for the Shift: vss, safety, pain management  Identify possible barriers to advancing the care plan:   Stability of the patient: Moderately Unstable - medium risk of patient condition declining or worsening    End of Shift Summary: pt remains aox 4, on 2L 02 via NC. BP 150/77  ~ Pulse 75  ~ Temp 36.2 C (97.2 F) (Oral)  ~ Resp 18  ~ Wt 67.6 kg (149 lb)  ~ SpO2 94%  ~ BMI 25.58 kg/m  pt w/ low BP this AM given 500 cc LR olus. Pain controlled w/ IVP dilaudid. Pt had BM x 1 on this shift. Fair po intake. Call light within reach. Bed alarm on. Will endorse to oncoming RN.

## 2021-04-15 NOTE — Consults
SPRITUAL CARE CONSULTATION NOTE    PATIENT:  Donna Gross  MRN:  [id number]     Patient Info        Religious/Spiritual Identity:        None       Last Anointed Date:                 Baptised:                 Spiritual Care Visit Details              Date of Visit:  04/14/21  Time of Visit:  1600  Visited with Patient   Visit length 15 Minutes   Referral source Self-initiated   Reason for visit Initial visit/assessment      Spiritual Assessment     Spiritual practices & resources Prayer,Spiritual reflection/discussion   Areas of spiritual/emotional distress Adjustment to illness/hospitalization   Distressful feelings     Indicators of spiritual wellbeing Able to give love and support,Able to receive love and support,Trust in God/the Transcendent,Demonstrates resilience,Expresses...   Expressions of spiritual wellbeing Expresses desire to get well,Expresses hope      Plan     Spiritual care intervention Active Listening,Prayer,Addressed spiritual concerns/distress,Building trust   Outcomes (per patient/family) Appreciated visit   Spiritual care plans Continue to visit as needed   Additional comments n/a      Recommendation           Author:  Lerline Valdivia Mujahid-Deen 04/14/2021 5:04 PM  Contact info: SM pager: [phone] ext: [phone]

## 2021-04-15 NOTE — Other
Patients Clinical Goal:   Clinical Goal(s) for the Shift: pain management, BP monitoring  Identify possible barriers to advancing the care plan:   Stability of the patient: Moderately Unstable - medium risk of patient condition declining or worsening    End of Shift Summary: Pt AOx4, on 2lpm o2 per NC. Pain controlled by PO oxycodone or IV dilaudid. Pending DC tomorrow. Call light within reach. Fall precaution. Blood pressure 102/75, pulse 64, temperature 36.5 C (97.7 F), temperature source Oral, resp. rate 18, weight 67.6 kg (149 lb), SpO2 95 %.

## 2021-04-15 NOTE — Nursing Note
1920 - bedside report received from day shift RN. Patient is oriented x 4. NAD, sating 93% on 1L. NAD. Bed alarm on, bed in lowest and locked position, side rails up x 3. Call light in reach    2002 - oxycodone given PRN abdominal pain 8/10

## 2021-04-15 NOTE — Other
Patients Clinical Goal:   Clinical Goal(s) for the Shift: comfort, monitor BP, fall prevention  Identify possible barriers to advancing the care plan: SNF placement  Stability of the patient: Moderately Unstable - medium risk of patient condition declining or worsening    End of Shift Summary: patient is oriented x 4. Non monitored. sating 92% on 2.5 L. Oxycodone given once PRN abdominal pain. Fair po intake. OOB with standby assist. Fall prevention in place, bed alarm on,bed in lowest and locked position, side rails up x 3. Call light in reach    BP 96/69  ~ Pulse 53  ~ Temp 36.1 ???C (97 ???F) (Oral)  ~ Resp 20  ~ Wt 67.6 kg (149 lb)  ~ SpO2 99%  ~ BMI 25.58 kg/m???       Awaiting SNF placement. Will endorse to oncoming RN    GERIATRICS END OF SHIFT - DISCHARGE MARKERS of Instability Checklist  Nursing assessment:     Indicator   Cutoff Check if indicator needs to be addressed (meets cutoff)   Situation/Action/Comment   O2 requirement New since admission []     PO intake Less than 50% []     Urination None in past 8 hours/last shift []     Foley New since admission []     Pain Present  [x]     Bowel movements  <1 in 2 days or >3 in 24 hours []     Delirium Unable to follow simple commands or participate with PT/OT []     Mobility Unable to stand and/or walk to bathroom []  OOB to chair 1 times.  Ambulated  To bathroom       BOOST / SAFE TRANSITION - DISCHARGE INDICATORS    Indicator Check if indicator has red ''P''    Situation/Action/Comment     Problems with Medications  []     Psychological  []     Principal Diagnosis  []     Physical Limitations  []     Poor Health Literacy  []     Patient Support  []     Prior Hospitalizations  []     Palliative Care  []       DELIRIUM PREVENTION  Protocols Strategies Check if implemented Comments   Risk factors >3 present- High risk []     Purposeful orientation Reorient, purposeful orienting conversation  Familiar objects in room []   []     Therapeutic activities Volunteer visit  Cognitive stimulation activities: games, reading, music []   []     Vision & hearing Assistance with: Leisure centre manager  Assistance with: hearing aids/hearing amplifier []   []     Feeding & hydration Assistance with feeding  Assistance with dentures []   []     Sleep hygiene Shades/blinds/lights on during day, limit naps during day  Quiet environment, consolidate care []     Mobilization BMAT 3-4: Ambulate TID  BMAT 2: OOB daily to chair for meals ? 2 hours, each time  BMAT 1: OOB to cardiac chair daily for meals ? 2 hours, each time []   []   []     Pain Non-narcotics ATC  Non-pharmacological: oil/aromatherapy, massage, music []   []     Maintain safety Fall precautions, volunteer visit, family at bedside, tele sitter, constant observer []     Manage agitation Redirect with calm, gentle voice and avoid confrontation  Avoid restraints and use alternative to restraints  Doll, music or animal therapy, as appropriate  Volunteer for companionship if safe and appropriate []   []   []   []       DELIRIUM: CAM (+)  Protocols  Strategies Check if implemented Comments   New-onset MD contacted  Delirium order-set initiated  Bladder scan to R/O retention  Assess stool impaction  Medication reviewed with pharmacist []   []   []   []   []  MD Name:   Existing Manage and prevent further delirium []

## 2021-04-16 LAB — Blood Culture Detection
BLOOD CULTURE FINAL STATUS: NEGATIVE
BLOOD CULTURE FINAL STATUS: NEGATIVE

## 2021-04-16 LAB — Comprehensive Metabolic Panel
CREATININE: 1.1 mg/dL (ref 0.60–1.30)
TOTAL CO2: 25 mmol/L (ref 20–30)

## 2021-04-16 LAB — CBC: NUCLEATED RBC%, AUTOMATED: 0 (ref 11.1–15.5)

## 2021-04-16 LAB — Differential Automated: ABSOLUTE LYMPHOCYTE COUNT: 3.13 10*3/uL (ref 1.30–3.40)

## 2021-04-16 MED ADMIN — SUCRALFATE 1 GM/10ML PO SUSP: 1 g | ORAL | @ 13:00:00 | Stop: 2021-04-17 | NDC 50268073211

## 2021-04-16 MED ADMIN — QUETIAPINE FUMARATE 100 MG PO TABS: 100 mg | ORAL | @ 15:00:00 | Stop: 2021-04-17 | NDC 00904664061

## 2021-04-16 MED ADMIN — METOCLOPRAMIDE HCL 10 MG PO TABS: 10 mg | ORAL | @ 13:00:00 | Stop: 2021-04-17 | NDC 51079088801

## 2021-04-16 MED ADMIN — TRAZODONE HCL 100 MG PO TABS: 100 mg | ORAL | @ 03:00:00 | Stop: 2021-04-17 | NDC 00904686961

## 2021-04-16 MED ADMIN — HYDROMORPHONE HCL 1 MG/ML IJ SOLN: .5 mg | INTRAVENOUS | @ 19:00:00 | Stop: 2021-04-16 | NDC 00409128331

## 2021-04-16 MED ADMIN — IPRATROPIUM BROMIDE 0.02 % IN SOLN: 500 ug | RESPIRATORY_TRACT | @ 20:00:00 | Stop: 2021-04-17 | NDC 00487980101

## 2021-04-16 MED ADMIN — ALBUTEROL SULFATE 2.5 MG/0.5ML IN NEBU: 5 mg | RESPIRATORY_TRACT | @ 05:00:00 | Stop: 2021-04-16

## 2021-04-16 MED ADMIN — ALBUTEROL SULFATE (2.5 MG/3ML) 0.083% IN NEBU: 5 mg | RESPIRATORY_TRACT | @ 20:00:00 | Stop: 2021-04-17

## 2021-04-16 MED ADMIN — OXYCODONE HCL 5 MG PO TABS: 5 mg | ORAL | @ 01:00:00 | Stop: 2021-04-17 | NDC 68084035411

## 2021-04-16 MED ADMIN — OXYCODONE HCL 5 MG PO TABS: 5 mg | ORAL | @ 17:00:00 | Stop: 2021-04-17 | NDC 00406055262

## 2021-04-16 MED ADMIN — HYDROMORPHONE HCL 1 MG/ML IJ SOLN: .5 mg | INTRAVENOUS | @ 13:00:00 | Stop: 2021-04-17 | NDC 00409128331

## 2021-04-16 MED ADMIN — ALBUTEROL SULFATE 2.5 MG/0.5ML IN NEBU: 5 mg | RESPIRATORY_TRACT | @ 16:00:00 | Stop: 2021-04-16

## 2021-04-16 MED ADMIN — ALBUTEROL SULFATE (2.5 MG/3ML) 0.083% IN NEBU: 5 mg | RESPIRATORY_TRACT | @ 19:00:00 | Stop: 2021-04-17

## 2021-04-16 MED ADMIN — HYDROMORPHONE HCL 1 MG/ML IJ SOLN: .5 mg | INTRAVENOUS | @ 04:00:00 | Stop: 2021-04-16 | NDC 00409128331

## 2021-04-16 MED ADMIN — MULTI-VITAMINS PO TABS: 1 | ORAL | @ 15:00:00 | Stop: 2021-04-17 | NDC 00904053961

## 2021-04-16 MED ADMIN — PANTOPRAZOLE SODIUM 40 MG PO TBEC: 40 mg | ORAL | @ 15:00:00 | Stop: 2021-04-17 | NDC 50268063911

## 2021-04-16 MED ADMIN — QUETIAPINE FUMARATE 100 MG PO TABS: 100 mg | ORAL | @ 03:00:00 | Stop: 2021-04-17 | NDC 00904664061

## 2021-04-16 MED ADMIN — ONDANSETRON HCL 4 MG/2ML IJ SOLN: 4 mg | INTRAVENOUS | @ 03:00:00 | Stop: 2021-04-17 | NDC 60505613000

## 2021-04-16 MED ADMIN — FLUOXETINE HCL 40 MG PO CAPS: 40 mg | ORAL | @ 15:00:00 | Stop: 2021-04-17 | NDC 68084010111

## 2021-04-16 MED ADMIN — METOCLOPRAMIDE HCL 10 MG PO TABS: 10 mg | ORAL | @ 18:00:00 | Stop: 2021-04-17 | NDC 51079088801

## 2021-04-16 MED ADMIN — SUCRALFATE 1 GM/10ML PO SUSP: 1 g | ORAL | @ 19:00:00 | Stop: 2021-04-17 | NDC 50268073211

## 2021-04-16 MED ADMIN — SUCRALFATE 1 GM/10ML PO SUSP: 1 g | ORAL | @ 03:00:00 | Stop: 2021-05-12 | NDC 50268073211

## 2021-04-16 MED ADMIN — IPRATROPIUM BROMIDE 0.02 % IN SOLN: 500 ug | RESPIRATORY_TRACT | @ 05:00:00 | Stop: 2021-04-17

## 2021-04-16 MED ADMIN — OXYCODONE HCL 5 MG PO TABS: 5 mg | ORAL | @ 23:00:00 | Stop: 2021-04-17 | NDC 68084035411

## 2021-04-16 MED ADMIN — IPRATROPIUM BROMIDE 0.02 % IN SOLN: 500 ug | RESPIRATORY_TRACT | @ 16:00:00 | Stop: 2021-04-17 | NDC 00487980101

## 2021-04-16 NOTE — Other
Patients Clinical Goal:   Clinical Goal(s) for the Shift: comfort, VSS, pain management,   Identify possible barriers to advancing the care plan: none  Stability of the patient: Moderately Unstable - medium risk of patient condition declining or worsening    End of Shift Summary: patient is oriented x 4. On 2L, sating 94%. IV dilaudid given x 1 for abdominal pain. Fair PO intake. zofran given x 1. Fall prevention in place. Bed alarm on, bed in lowest and locked position, side rails up x 3. Call light in reach.     BP 136/86  ~ Pulse 76  ~ Temp 36.8 ???C (98.3 ???F) (Oral)  ~ Resp 18  ~ Wt 67.6 kg (149 lb)  ~ SpO2 96%  ~ BMI 25.58 kg/m???       Plan: d/c to [facility] today. Will endorse to oncoming RN    GERIATRICS END OF SHIFT - DISCHARGE MARKERS of Instability Checklist  Nursing assessment:     Indicator   Cutoff Check if indicator needs to be addressed (meets cutoff)   Situation/Action/Comment   O2 requirement New since admission []     PO intake Less than 50% []     Urination None in past 8 hours/last shift []     Foley New since admission []     Pain Present  [x]     Bowel movements  <1 in 2 days or >3 in 24 hours []     Delirium Unable to follow simple commands or participate with PT/OT []     Mobility Unable to stand and/or walk to bathroom []  OOB to chair 2 times.  Ambulated to bathroom       BOOST / SAFE TRANSITION - DISCHARGE INDICATORS    Indicator Check if indicator has red ''P''    Situation/Action/Comment     Problems with Medications  []     Psychological  []     Principal Diagnosis  []     Physical Limitations  []     Poor Health Literacy  []     Patient Support  []     Prior Hospitalizations  []     Palliative Care  []       DELIRIUM PREVENTION  Protocols Strategies Check if implemented Comments   Risk factors >3 present- High risk []     Purposeful orientation Reorient, purposeful orienting conversation  Familiar objects in room []   []     Therapeutic activities Volunteer visit  Cognitive stimulation activities: games, reading, music []   []     Vision & hearing Assistance with: Leisure centre manager  Assistance with: hearing aids/hearing amplifier []   []     Feeding & hydration Assistance with feeding  Assistance with dentures []   []     Sleep hygiene Shades/blinds/lights on during day, limit naps during day  Quiet environment, consolidate care []     Mobilization BMAT 3-4: Ambulate TID  BMAT 2: OOB daily to chair for meals ? 2 hours, each time  BMAT 1: OOB to cardiac chair daily for meals ? 2 hours, each time []   []   []     Pain Non-narcotics ATC  Non-pharmacological: oil/aromatherapy, massage, music []   []     Maintain safety Fall precautions, volunteer visit, family at bedside, tele sitter, constant observer []     Manage agitation Redirect with calm, gentle voice and avoid confrontation  Avoid restraints and use alternative to restraints  Doll, music or animal therapy, as appropriate  Volunteer for companionship if safe and appropriate []   []   []   []   DELIRIUM: CAM (+)  Protocols Strategies Check if implemented Comments   New-onset MD contacted  Delirium order-set initiated  Bladder scan to R/O retention  Assess stool impaction  Medication reviewed with pharmacist []   []   []   []   []  MD Name:   Existing Manage and prevent further delirium []

## 2021-04-16 NOTE — Nursing Note
2000 - patient reports abdominal pain 9/10. Oxycodone given @ 1830, and Dilaudid not due until 0100. MD paged.
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
## 2021-04-17 MED ADMIN — ERTAPENEM IVPB: 1 g | INTRAVENOUS | @ 02:00:00 | Stop: 2021-04-17 | NDC 60505619600

## 2021-04-17 MED ADMIN — HYDROMORPHONE HCL 1 MG/ML IJ SOLN: .5 mg | INTRAVENOUS | @ 01:00:00 | Stop: 2021-04-17 | NDC 00409128331

## 2021-04-17 MED ADMIN — METOCLOPRAMIDE HCL 10 MG PO TABS: 10 mg | ORAL | @ 02:00:00 | Stop: 2021-04-17 | NDC 51079088801

## 2021-04-17 NOTE — Other
Patients Clinical Goal:   Clinical Goal(s) for the Shift: comfort,vss  Identify possible barriers to advancing the care plan:   Stability of the patient: Patient stable  End of Shift Summary:     Report given to Sarah prior to discharge to [facility]. [facility].  Patient discharged on 04/16/2021 at time of 1930  Patient transported by EMS company   Verbal report and discharge packet (including MAR report) given to EMTs prior to transport.    Vitals signs upon discharge were Blood pressure 106/56, pulse 72, temperature 36.1 C (97 F), temperature source Oral, resp. rate 18, weight 67.6 kg (149 lb), SpO2 98 %.  Skin condition intact  Patient a&ox4    DPOA aware that patient discharged to facility.    Patient had no home medications that needed to be returned to the DPOA from the hospital pharmacy.    Belongings patient sent with EMT.    Patient stable upon discharge.

## 2021-04-18 ENCOUNTER — Telehealth: Payer: PRIVATE HEALTH INSURANCE

## 2021-04-18 NOTE — Telephone Encounter
Re: patient Donna Gross- [id number]     *spoke to [facility] from Dr. Reuel Derby office, states per MD no need to follow up with Interventional GI* Should we schedule with her regular GI Dr. Mariam Dollar at [facility]? Please advise,    Is the patient being discharged to SNF? Yes  Clinic/Test/Procedure to be scheduled:GI  Doctor Preferences (If applicable): Dr. Selena Batten  Time Frame: Within 1 week  Reason for Appointment/Procedure: Follow up recent hospitalization for Elevated liver enzymes:    Facility Transfer/Placement (if applicable):   Accepting Facility - Level of Care: Extended Care Facility  SNF Facility Address :  Accepting Facility Name: [facility]  Accepting Facility Address:   [address] [place],   [place]   [address]  Contact Person: ADMISSIONS COORDINATOR  Phone Number: [phone]  Fax Number: [phone]  Accepting MD: Marlyn Corporal, MD  Accepting MD Number: [id number]      Haze Justin    Hospitalist Services  P: [phone]

## 2021-04-29 ENCOUNTER — Ambulatory Visit: Payer: PRIVATE HEALTH INSURANCE

## 2021-04-29 NOTE — Progress Notes
General Surgery Progress Note    Subjective:     55 y.o. female with an extensive past surgical history including Donna Gross patch, ileostomy, abdominal hernia repair, and most recently vagotomy, antrectomy, gastrojejunostomy, and extensive lysis of adhesions (06/2019) in the setting of refractory peptic ulcer disease, GERD, fibromyalgia,  pancreatitis, and known symptomatic cholelithiasis and choledocholithiasis who was recently admitted to [facility] after she was found to have E. Coli bacteremia in the setting of transaminitis (AST 1849, ALT 714, ALP 280, Tbili 0.7), RUQ [place] with mild gallbladder thickening, mild extrahepatic biliary dilation with possible sludge/stones in CBD, and MRCP with multiple small filling defects in CBD, now s/p ERCP (04/11/21) who presents for outpatient discussion of elective cholecystectomy.    Objective:         Vitals:There were no vitals taken for this visit. There is no height or weight on file to calculate BMI. There were no vitals filed for this visit.  There were no vitals filed for this visit.  General: NAD, alert and oriented x3  Skin: Clear without evident jaundice.  HEENT: Normocephalic, atraumatic, wearing a mask  Neck: Supple, Trachea midline, no adenopathy  Lungs: Breathing nonlabored, symmetric chest rise, no audible wheeze or stridor.   Heart: Regular rate and rhythm   Abdomen: ***  Extremities: Warm and well perfused, no cyanosis or edema.       Path:  FINAL DIAGNOSIS   Date Value Ref Range Status   03/18/2021   Final      A. STOMACH (BIOPSY):  - Antral mucosa with chronic gastritis without activity  - Negative for Helicobacter pylori    - Negative for intestinal metaplasia    B. DUODENUM (BIOPSY):  - Duodenal mucosa with no reactive epithelial changes  - Negative for active duodenitis, Marsh lesion, eosinophilic infiltrates, granulomas, viral inclusions or parasites            Assessment:     Donna Gross is a 55 y.o. female s/p *** on ***. Patient is doing well. Pain is well controlled and tolerating diet without any issues.       Plan/Recommendations:     - D/C from clinic but may f/u prn ***    [name]. Ave Filter, MD  04/29/2021, 2:30 PM

## 2021-05-15 IMAGING — CT CT CHEST WITH CONTRAST
3 of 4 series · 17 of 29 positions shown, 19 images · IV contrast (agent unspecified)
Comparison: High-resolution chest CT, 03/10/2021.

FINAL REPORT:
CT CHEST WITH CONTRAST
INDICATION: Solitary pulmonary nodule
Sjogren syndrome with keratoconjunctivitis

[Series 2: routine chest · axial · 0.72mm/px · z∈[-222,-2]mm · 6 of 132 slices shown, 8 images]
[im 22/132  mediastinal]
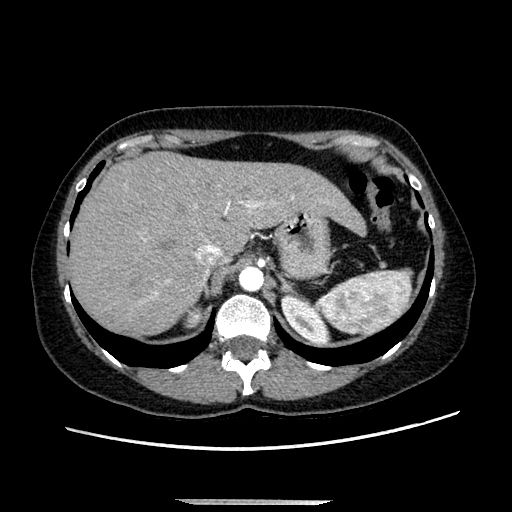
[im 22/132  lung]
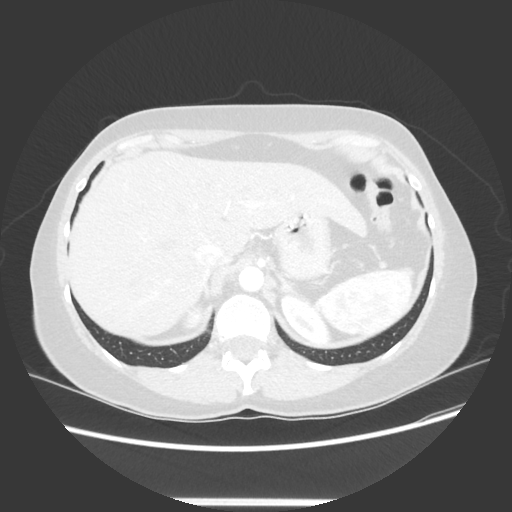
[im 44/132  lung]
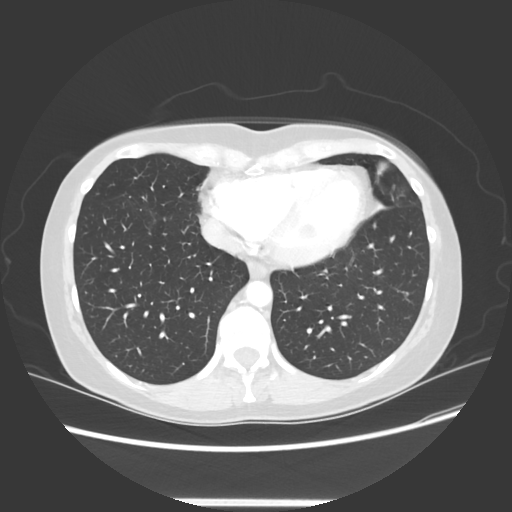
[im 66/132  lung]
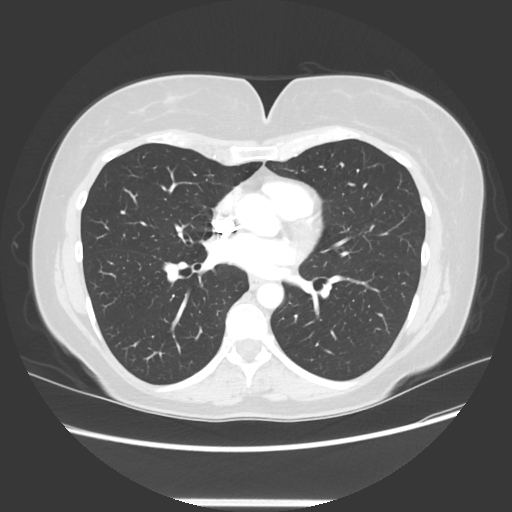
[im 67/132  lung]
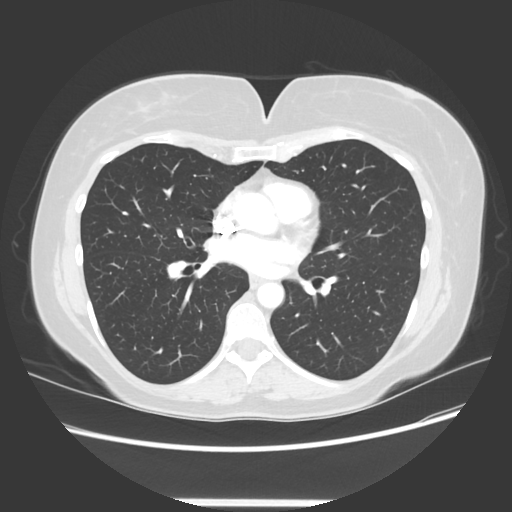
[im 88/132  mediastinal]
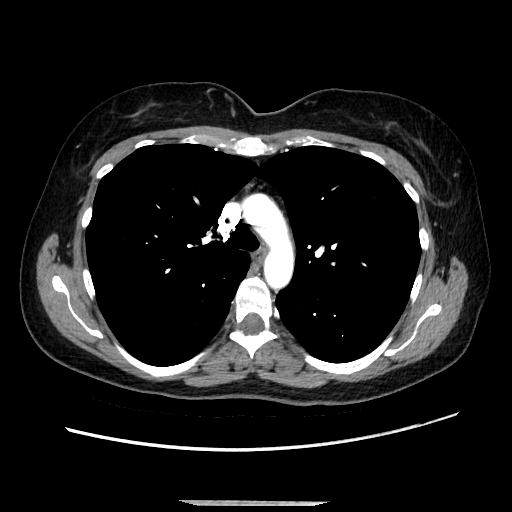
[im 88/132  lung]
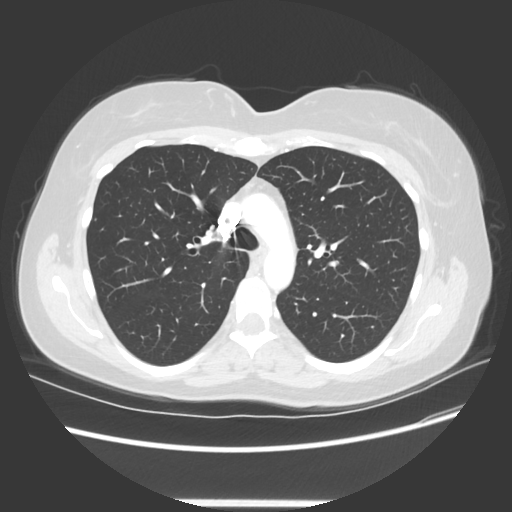
[im 110/132  lung]
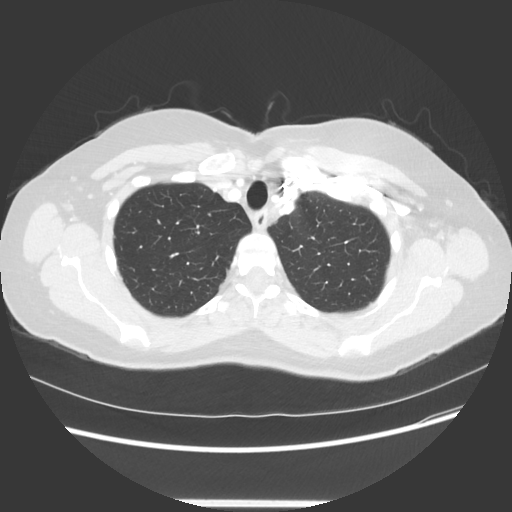

[Series 601: sagittal · sagittal · 0.71mm/px · 8 of 185 slices shown]
[im 19/185  lung]
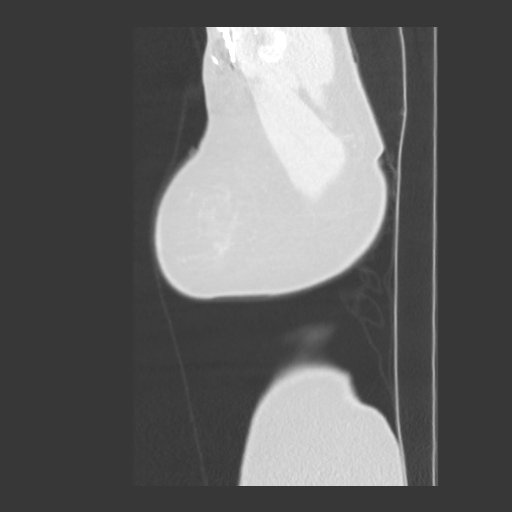
[im 37/185  lung]
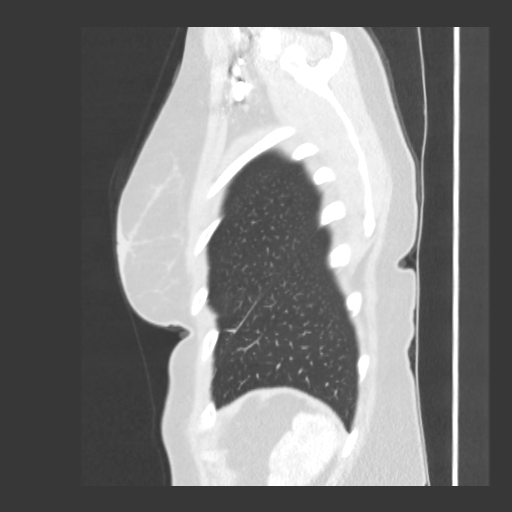
[im 56/185  lung]
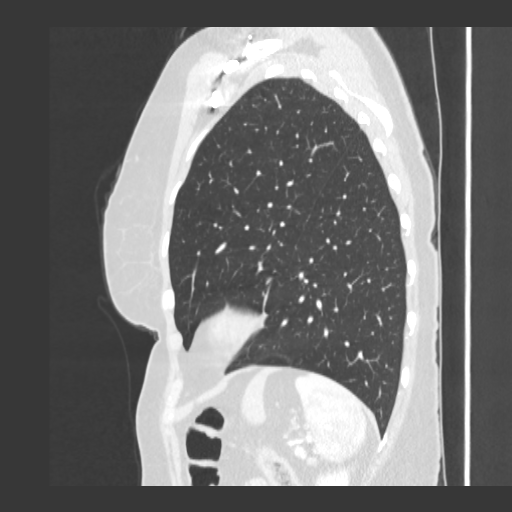
[im 74/185  lung]
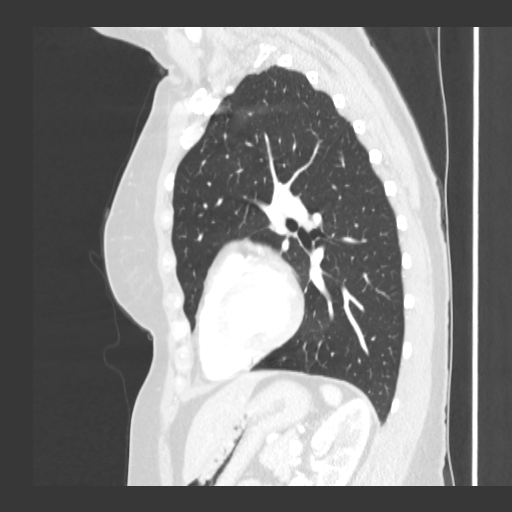
[im 111/185  lung]
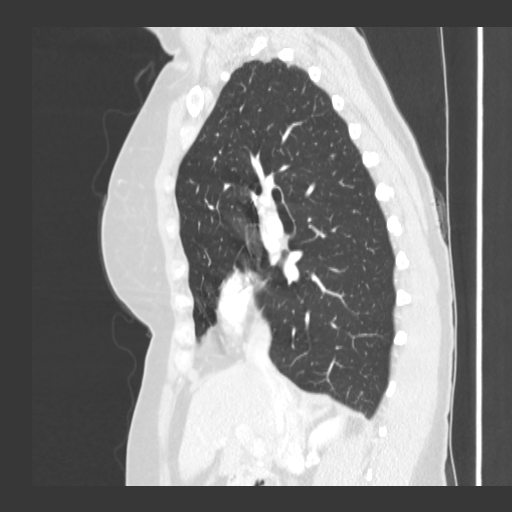
[im 129/185  lung]
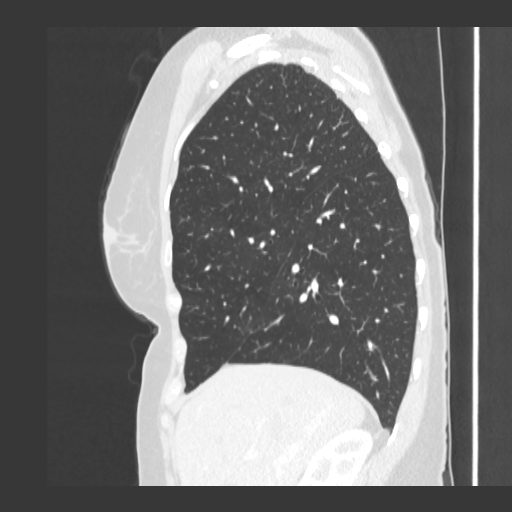
[im 148/185  lung]
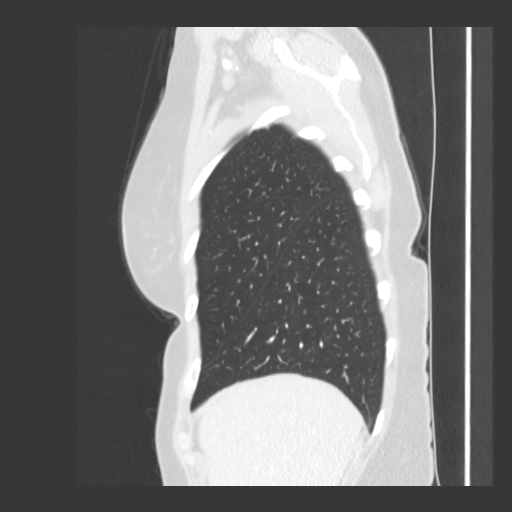
[im 166/185  lung]
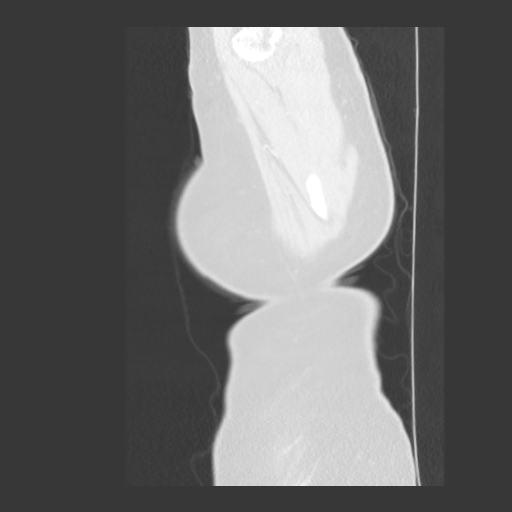

[Series 602: coronal · coronal · 0.72mm/px · 3 of 144 slices shown]
[im 18/144  lung]
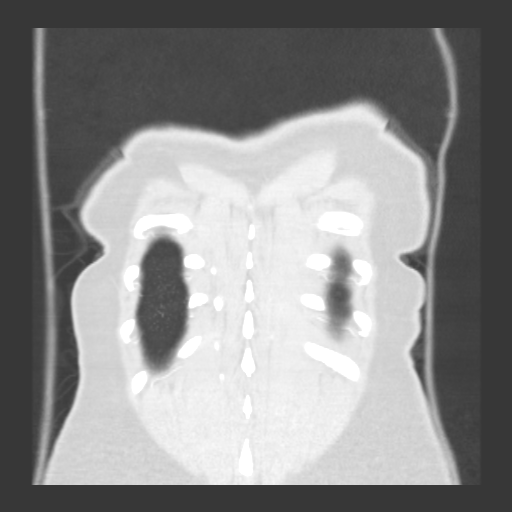
[im 36/144  lung]
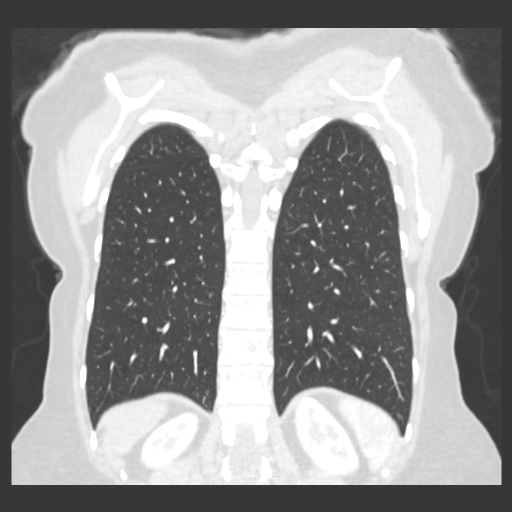
[im 54/144  lung]
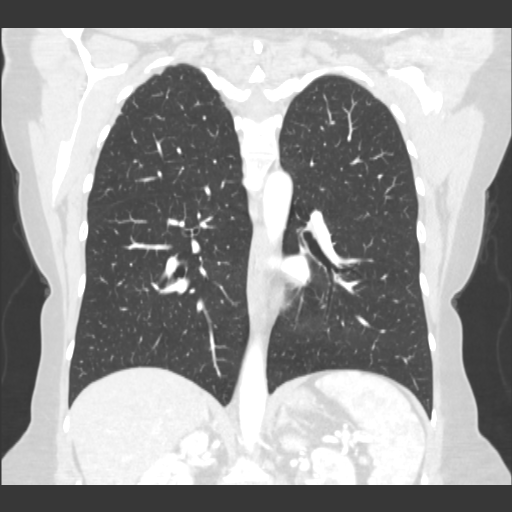

[17 of 29 positions shown; findings below may reference images not displayed]

PROCEDURE:
Axial CT images of the chest were obtained following administration IV contrast. Informed consent for contrast was obtained. Coronal and sagittal reconstruction images were generated and reviewed.
FINDINGS: Minimal subsegmental atelectasis in the lingula shows improved aeration prior exam. Lungs otherwise clear. No nodules. No pleural effusions or pneumothorax. Patent central airways. No airway thickening bronchiectasis.
Heart and great vessels within normal size limits. No pericardial effusion or evidence for thoracic lymphadenopathy. Unremarkable CT appearance of thyroid gland.
Upper abdominal structures unremarkable visualized. Minimal bilateral degenerative changes in spine.
IMPRESSION: Unremarkable chest CT.
No lung nodules or suspicious abnormalities.
Location code: 1
Is the patient pregnant?
No

## 2021-07-14 IMAGING — US US PELVIS TRANSVAGINAL
1 series · 14 of 25 positions shown · non-contrast
Comparison: none

This is a summary report. The complete report is available in the patient's medical record. If you cannot access the medical record, please contact the sending organization for a detailed fax or copy.
INDICATION: Thickened endometrium.
Ultrasound demonstrated uterus that measures 5.4 x 3.2 x 4.1cm, appears normal size and shape.  Endometrium measures 0.56cm, appears thickened for postmenopausal.  Right ovary measures 2.3 x 1.8 x 1.3cm.  Left ovary measures 1.8 x 1.4 x 1.0cm. Both ovaries appear normal size and shape.  Simple appearing cyst seen adjacent to the right ovary that measures 1.7 x 0.9 x 1.1cm, possibly ovarian in nature.  No free fluid seen within the pelvis.  Transvaginal scan performed.

[Series 1: us pelvis transvaginal · 14 of 26 slices shown]
[im 1/26]
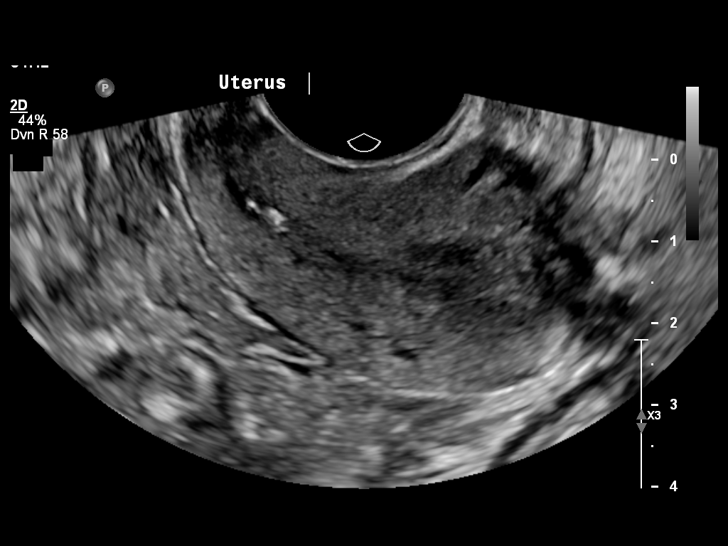
[im 3/26]
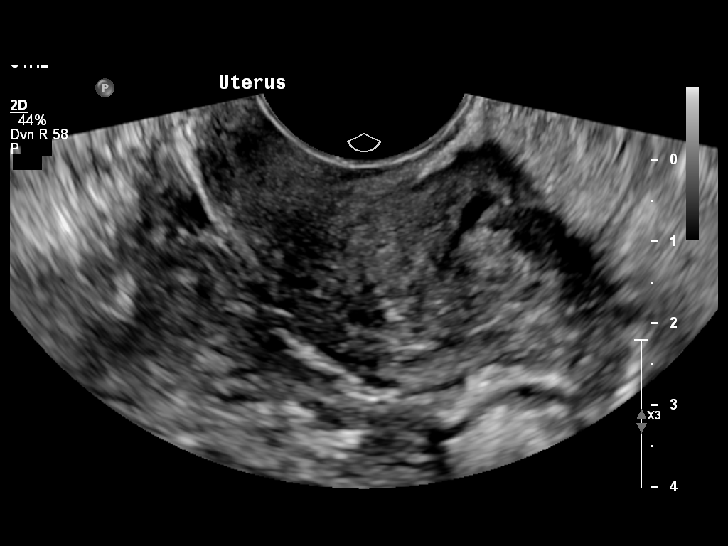
[im 5/26]
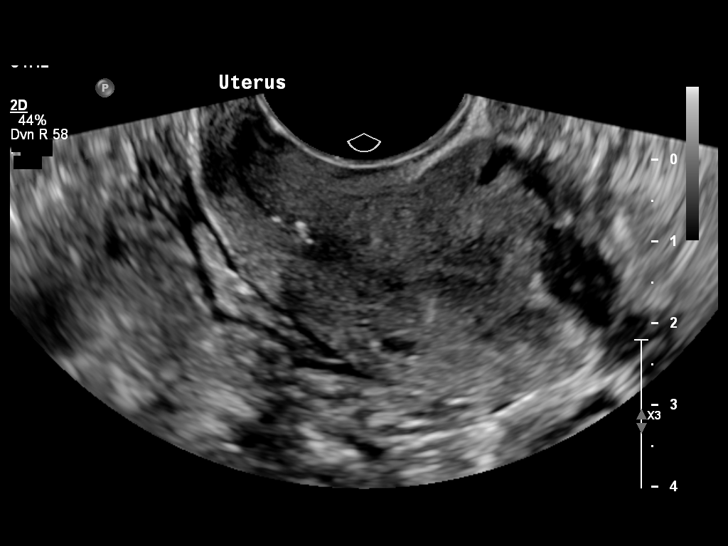
[im 7/26]
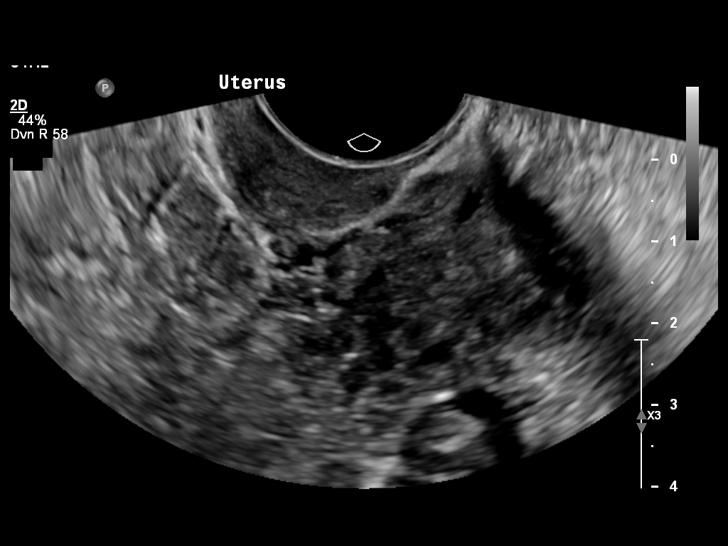
[im 9/26]
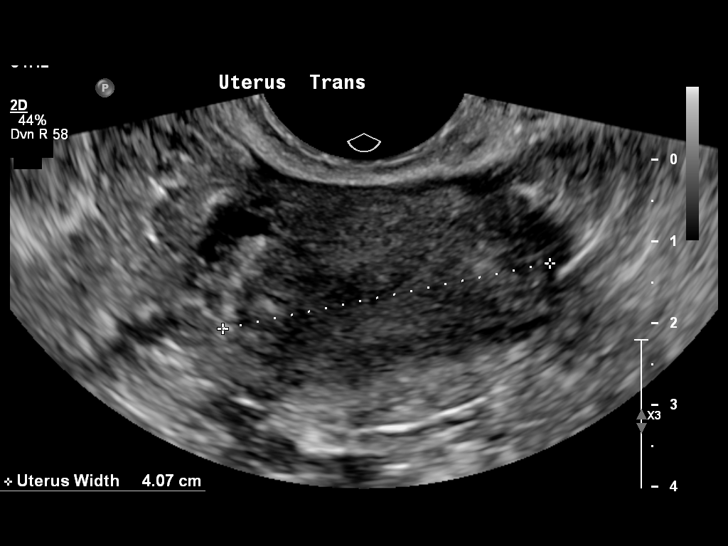
[im 10/26]
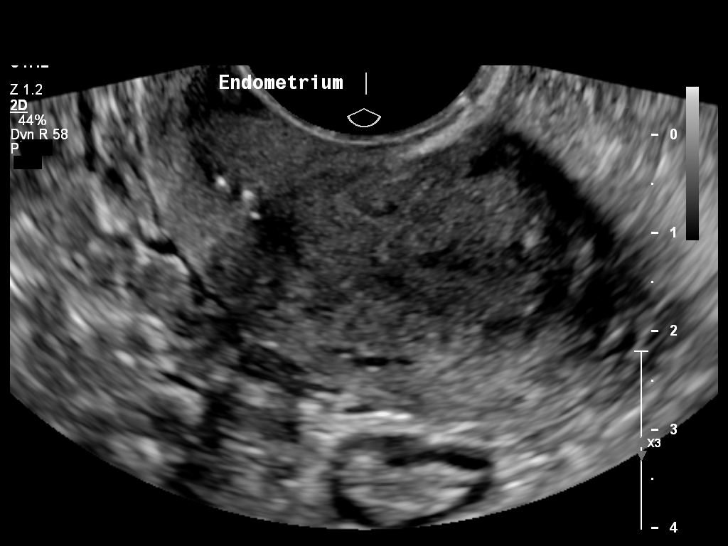
[im 12/26]
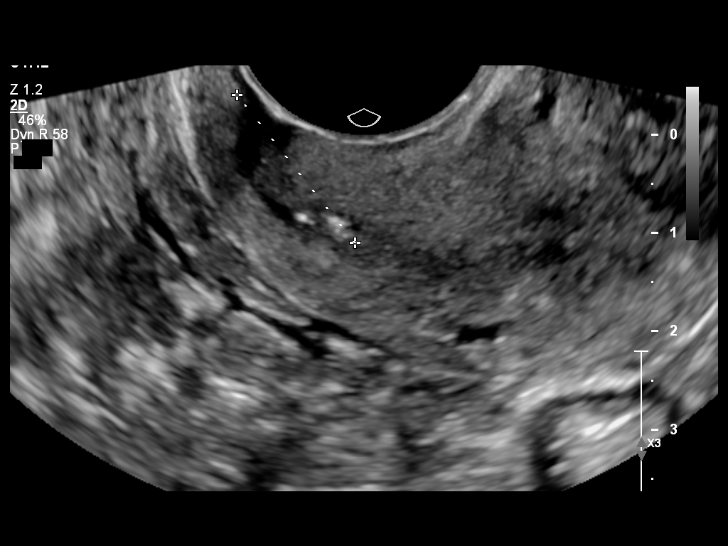
[im 14/26]
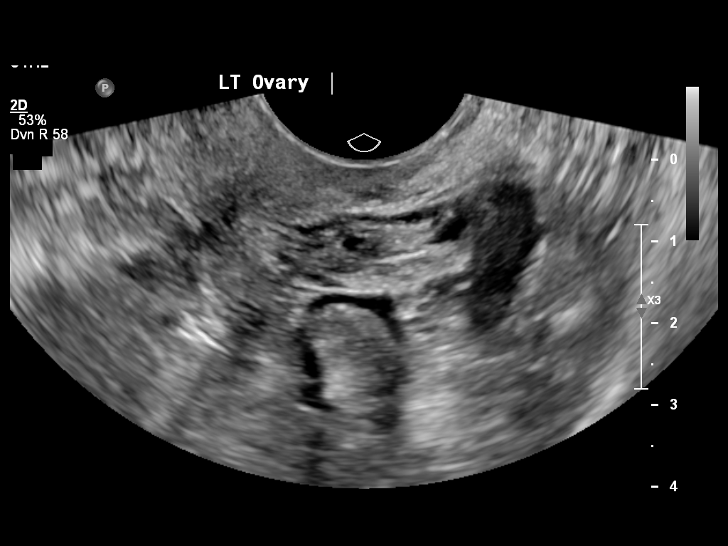
[im 16/26]
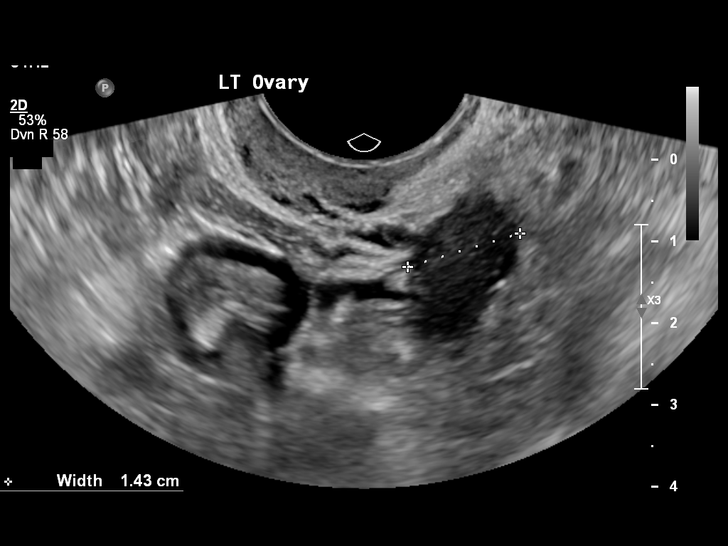
[im 17/26]
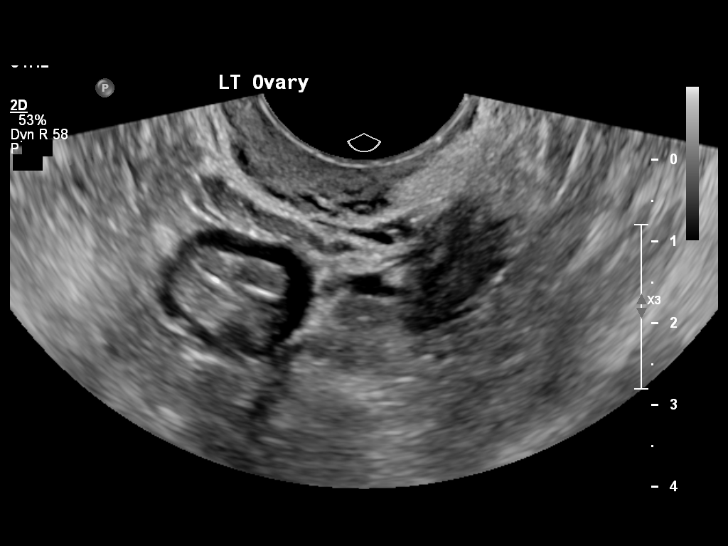
[im 19/26]
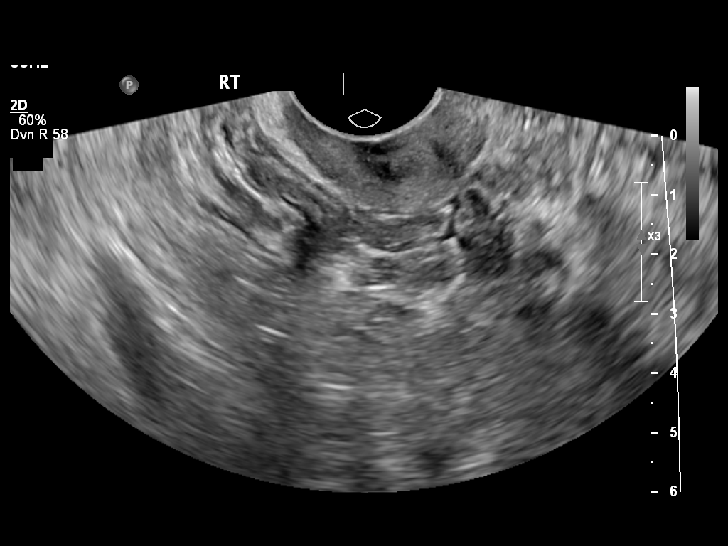
[im 21/26]
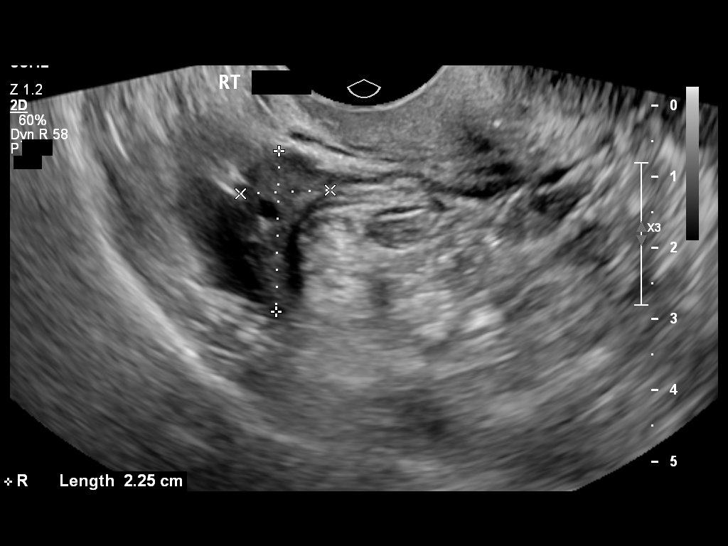
[im 23/26]
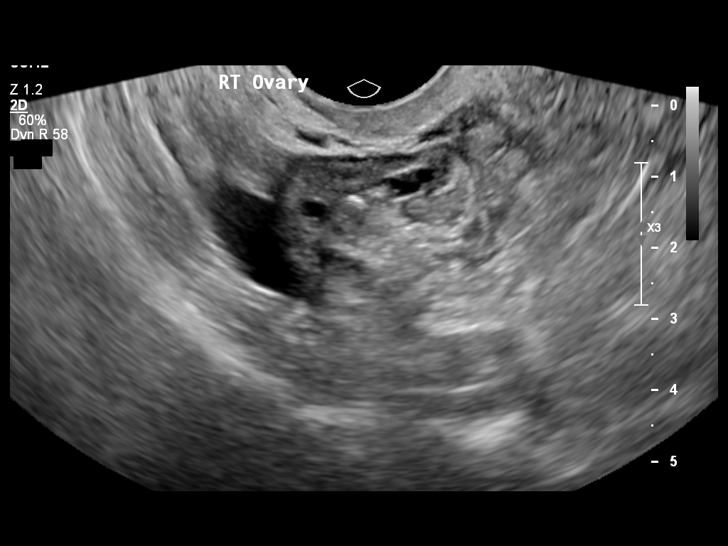
[im 26/26]
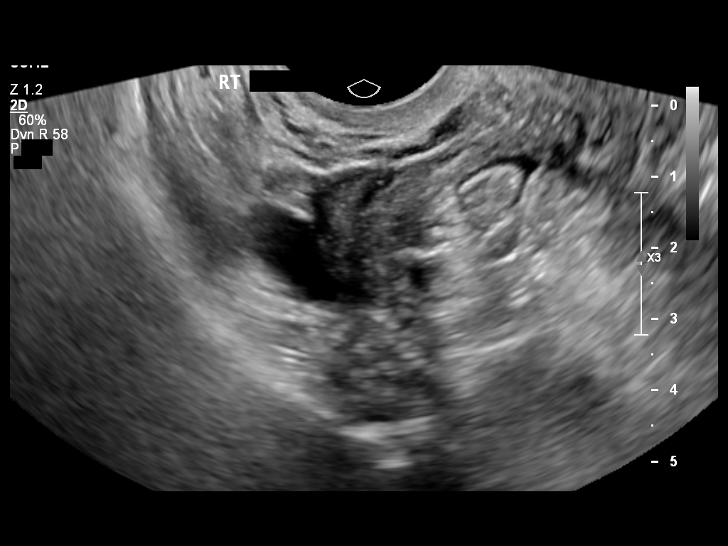

[14 of 25 positions shown; findings below may reference images not displayed]
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
## 2021-11-28 ENCOUNTER — Telehealth: Payer: PRIVATE HEALTH INSURANCE

## 2021-11-28 NOTE — Telephone Encounter
Call Back Request      Reason for call back: Pt requesting to schedule f/u, referral faxed this morning, please confirm it was received and contact pt for scheduling.     Any Symptoms:  []  Yes  [x]  No       If yes, what symptoms are you experiencing:    o Duration of symptoms (how long):    o Have you taken medication for symptoms (OTC or Rx):      If call was taken outside of clinic hours:    [] Patient or caller has been notified that this message was sent outside of normal clinic hours.     [] Patient or caller has been warm transferred to the physician's answering service. If applicable, patient or caller informed to please call back if symptoms progress.  Patient or caller has been notified of the turnaround time of 1-2 business day(s).

## 2021-11-29 IMAGING — MG MAMMO BREAST SCREENING TOMOSYNTHESIS BILATERAL
8 of 12 series · 8 of 28 positions shown · non-contrast
Comparison: 06/03/2020 and 06/01/2019.

This is a summary report. The complete report is available in the patient's medical record. If you cannot access the medical record, please contact the sending organization for a detailed fax or copy.
FINAL REPORT:
HISTORY: 55 years year old Female Encounter for other screening for malignant neoplasm of breast
EXAM: MAMMO BREAST SCREENING TOMOSYNTHESIS BILATERAL

[L MLO]
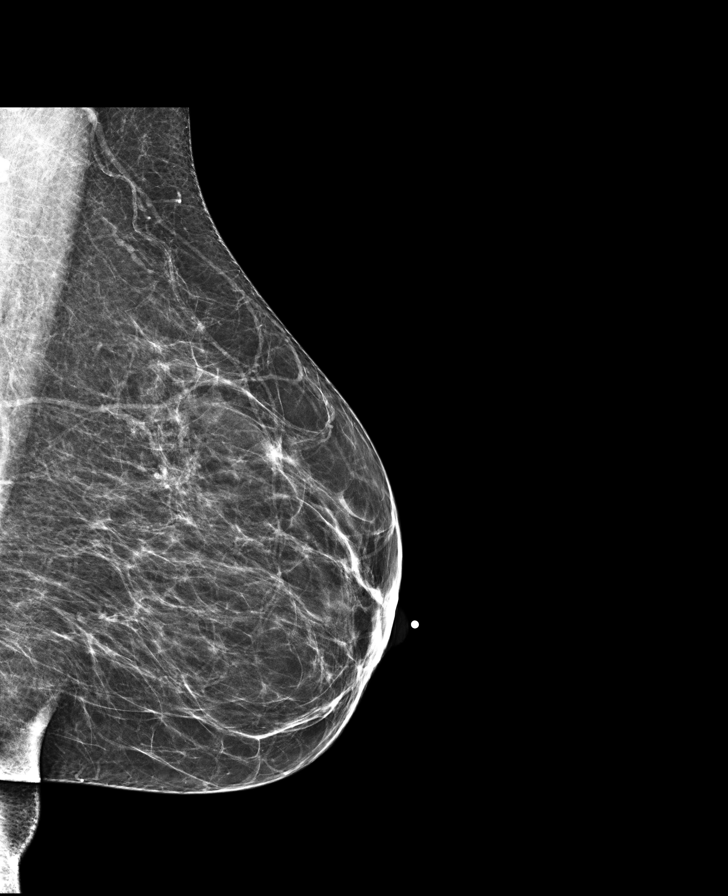

[L MLO synth-2D]
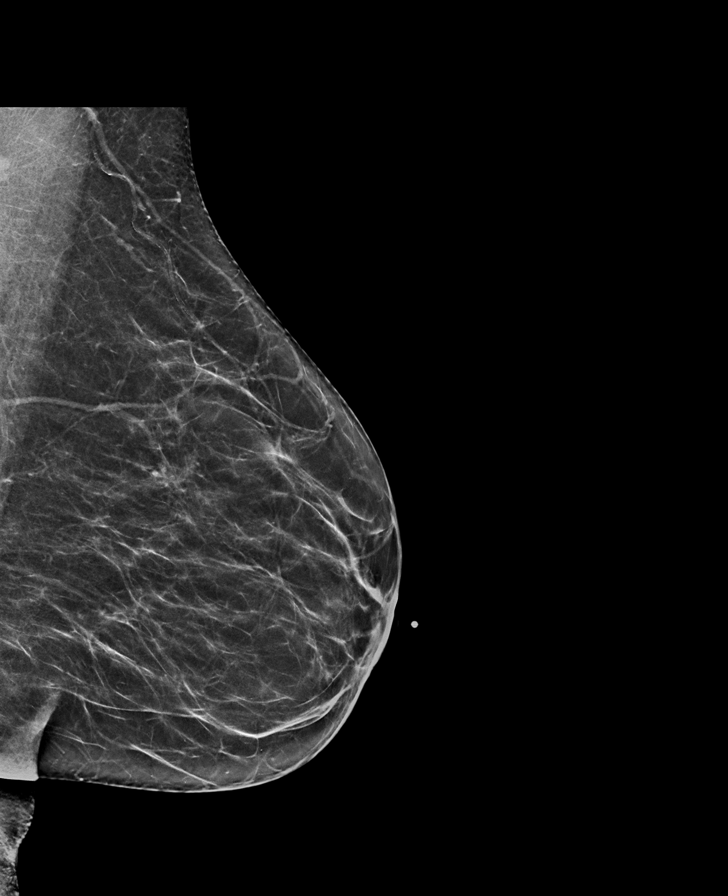

[L CC]
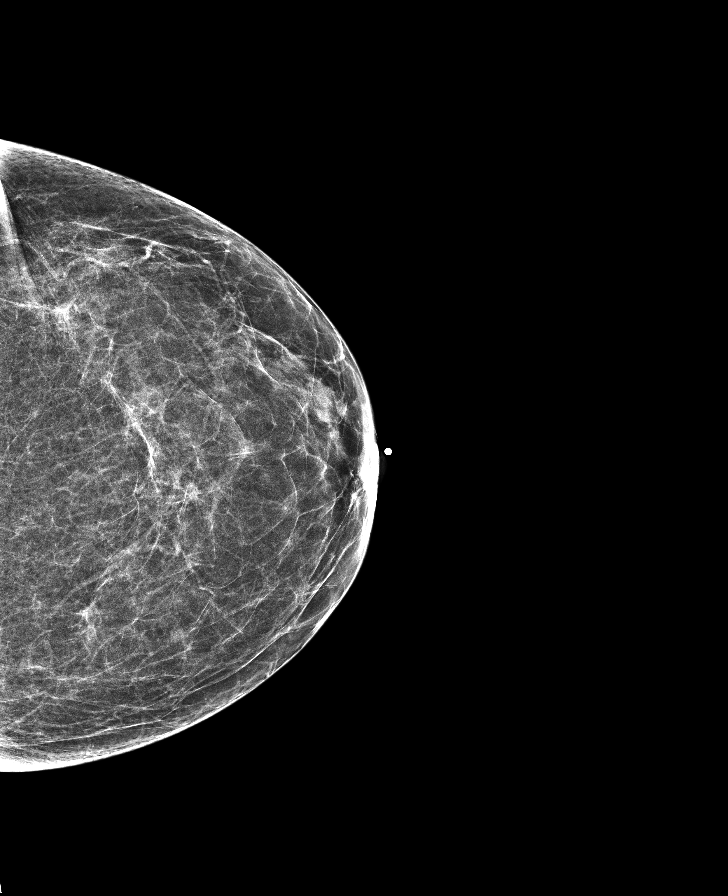

[R MLO]
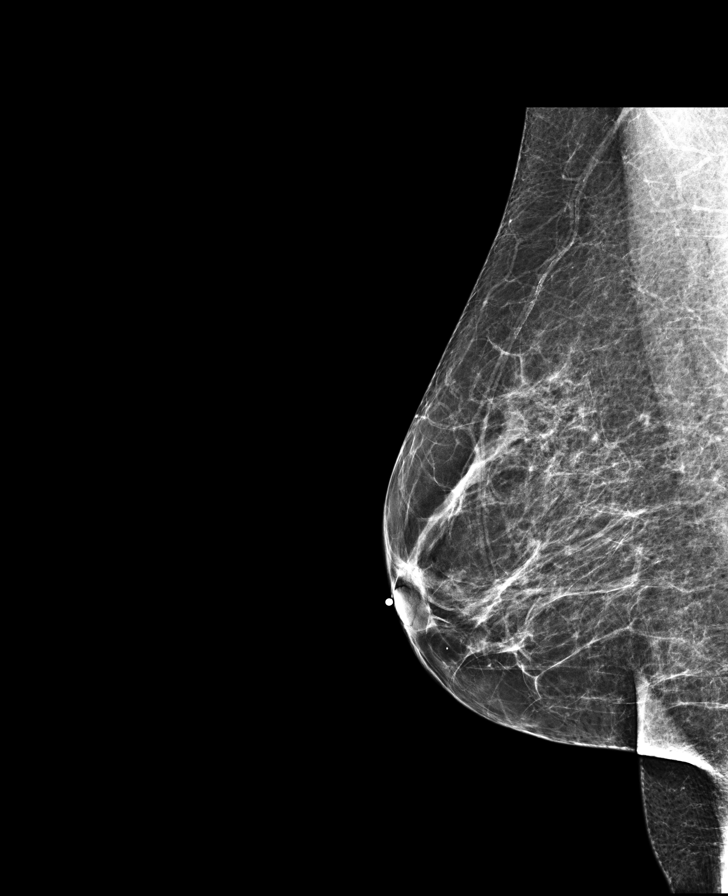

[L CC synth-2D]
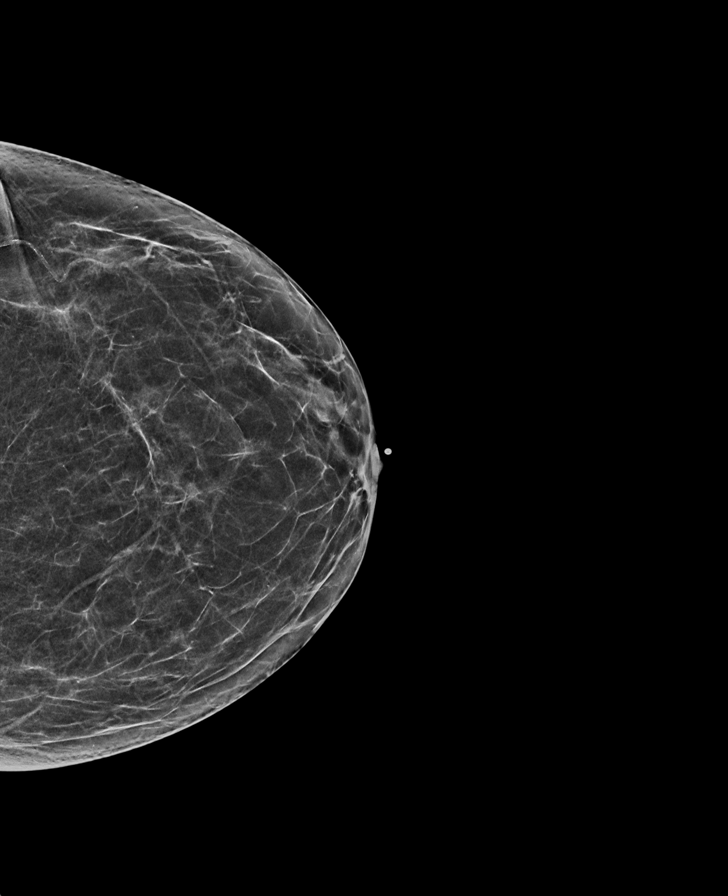

[R CC synth-2D]
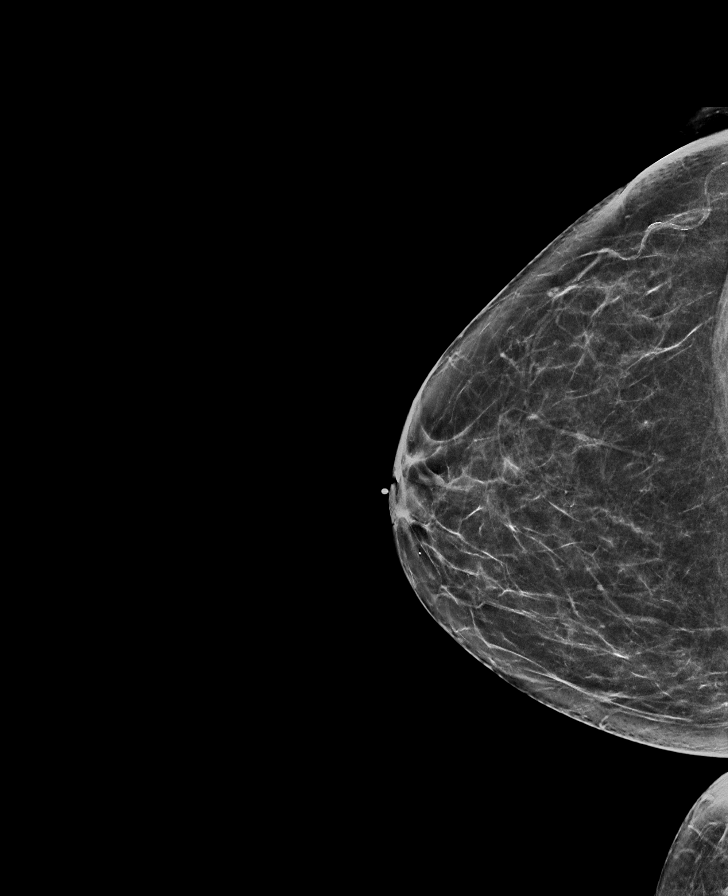

[R MLO synth-2D]
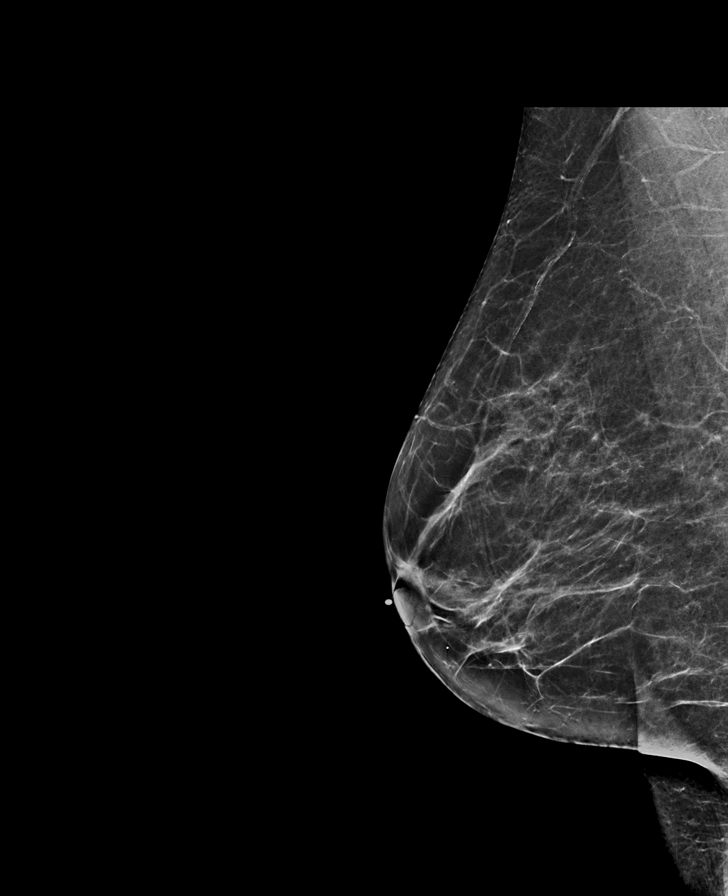

[R CC]
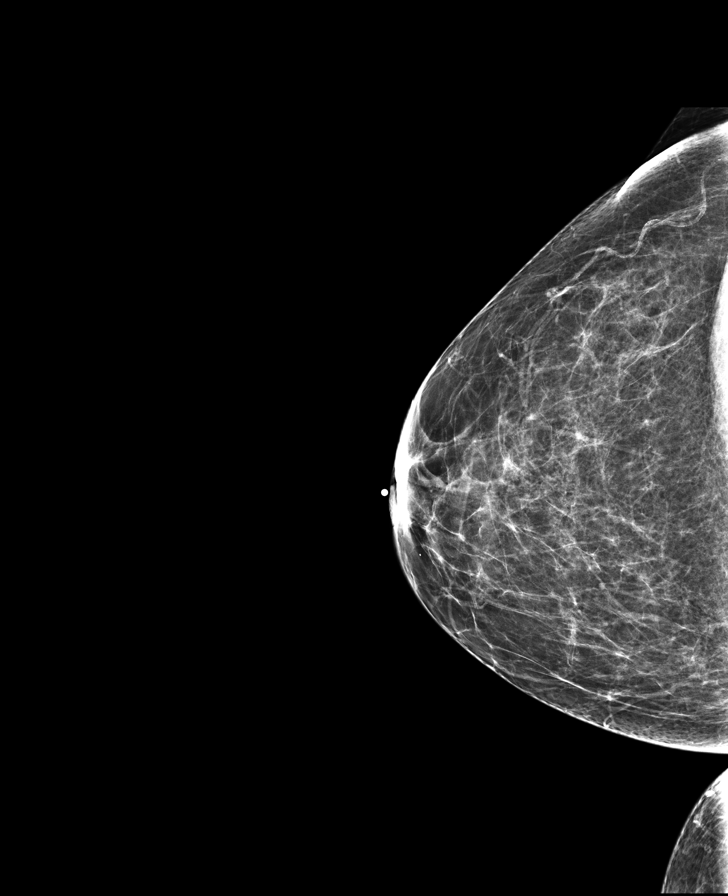

[8 of 28 positions shown; findings below may reference images not displayed]

CAD: CAD was utilized for this examination.
Breast Density: (B) Scattered fibroglandular elements
FINDINGS: No suspicious masses, architectural distortion, or suspicious calcifications.
IMPRESSION: 1. BI-RADS 1, negative mammogram.
2. Recommend routine annual screening mammogram.
The patient will be entered into a reminder system with a target due date for the next mammogram.
Is the patient pregnant?
No

## 2021-12-19 ENCOUNTER — Inpatient Hospital Stay
Admit: 2021-12-19 | Discharge: 2021-12-19 | Disposition: A | Discharge status: Home / Self Care | Payer: PRIVATE HEALTH INSURANCE | Source: Home / Self Care

## 2021-12-19 ENCOUNTER — Non-Acute Institutional Stay: Payer: PRIVATE HEALTH INSURANCE

## 2021-12-19 DIAGNOSIS — R109 Unspecified abdominal pain: Secondary | ICD-10-CM

## 2021-12-19 LAB — Bilirubin,Conjugated: BILIRUBIN,CONJUGATED: <0.2 mg/dL (ref <=0.3)

## 2021-12-19 LAB — Alkaline Phosphatase: ALKALINE PHOSPHATASE: 81 U/L (ref 37–113)

## 2021-12-19 LAB — Aspartate Aminotransferase: ASPARTATE AMINOTRANSFERASE: 19 U/L (ref 13–62)

## 2021-12-19 LAB — CBC: MCH CONCENTRATION: 30.2 g/dL — ABNORMAL LOW (ref 31.5–35.5)

## 2021-12-19 LAB — Extra Light Green Top

## 2021-12-19 LAB — Basic Metabolic Panel
ANION GAP: 13 mmol/L (ref 8–19)
SODIUM: 137 mmol/L (ref 135–146)

## 2021-12-19 LAB — Albumin: ALBUMIN: 4.4 g/dL (ref 3.9–5.0)

## 2021-12-19 LAB — Lipase: LIPASE: 33 U/L (ref 13–69)

## 2021-12-19 LAB — Alanine Aminotransferase: ALANINE AMINOTRANSFERASE: 9 U/L (ref 8–70)

## 2021-12-19 LAB — Amylase: AMYLASE: 93 U/L (ref 31–124)

## 2021-12-19 LAB — Differential Automated: ABSOLUTE IMMATURE GRAN COUNT: 0.02 10*3/uL (ref 0.00–0.04)

## 2021-12-19 MED ORDER — OXYCODONE-ACETAMINOPHEN 5-325 MG PO TABS
2 | ORAL_TABLET | ORAL | 0 refills | Status: AC | PRN
Start: 2021-12-19 — End: ?

## 2021-12-19 MED ADMIN — ONDANSETRON HCL 4 MG/2ML IJ SOLN: 4 mg | INTRAVENOUS | @ 18:00:00 | Stop: 2021-12-19 | NDC 60505613000

## 2021-12-19 MED ADMIN — IOHEXOL 350 MG/ML IV SOLN: 100 mL | INTRAVENOUS | @ 19:00:00 | Stop: 2021-12-19 | NDC 00407141491

## 2021-12-19 MED ADMIN — HYDROMORPHONE HCL 1 MG/ML IJ SOLN: 1 mg | INTRAVENOUS | @ 18:00:00 | Stop: 2021-12-19 | NDC 00409128331

## 2021-12-19 NOTE — ED Notes
Report received from Molly RN.

## 2021-12-19 NOTE — ED Notes
Pt's daughter en route to drive her home

## 2021-12-19 NOTE — ED Notes
Pt returned from CT °

## 2021-12-19 NOTE — ED Provider Notes
[facility]  Emergency Department Service Report    Donna Gross 55 y.o. female , presents with Abdominal Pain      Triage   Arrived on 12/19/2021 at 8:40 AM   Arrived by Walk-in [14]    ED Triage Vitals   Temp Temp Source BP Heart Rate Resp SpO2 O2 Device Pain Score Weight   12/19/21 0841 12/19/21 0841 12/19/21 0841 12/19/21 0841 12/19/21 0841 12/19/21 0841 12/19/21 0841 12/19/21 0846 12/19/21 0841   36.6 ?C (97.8 ?F) Oral 126/81 65 18 98 % None (Room air) Ten 68 kg (150 lb)       Pre hospital care:       Allergies   Allergen Reactions   ? Hydrocodone Other (See Comments)     ''burns a hole in stomach''  Tolerates hydromorphone   ? Ibuprofen Other (See Comments)     GI discomfort    ''hurts my stomach''   ? Morphine Itching     Tolerates hydromorphone       History   Patient h/o chronic abdominal pain presents with right abdominal pain since yesterday.  Endorses nausea, no fever.  Recently seen and evaluated on 12/16/21 for same.  States allergic to Norco, Ibuprofen and Morphine, can only get Dilaudid for pain.  10/10 currently after palpation of abdomen.  Denies dysuria.    The history is provided by the patient. No language interpreter was used.   Abdominal Pain  The primary symptoms of the illness include abdominal pain, nausea and vomiting. The primary symptoms of the illness do not include diarrhea. The current episode started yesterday. The onset of the illness was gradual.            Past Medical History:   Diagnosis Date   ? Anxiety    ? Depression    ? Emphysema, unspecified (HCC/RAF)    ? Emphysema, unspecified (HCC/RAF)    ? Fibromyalgia    ? Gastritis    ? GERD (gastroesophageal reflux disease)    ? Pancreatitis         Past Surgical History:   Procedure Laterality Date   ? ABDOMINAL SURGERY     ? COLON SURGERY          Past Family History   family history includes Diabetes in her brother and father; Heart disease in her mother.                 Past Social History   she reports that she has been smoking cigarettes. She uses smokeless tobacco. She reports that she does not drink alcohol, does not use drugs, and does not engage in sexual activity.     Review of Systems   Gastrointestinal: Positive for abdominal pain, nausea and vomiting. Negative for diarrhea.       Physical Exam   Physical Exam  Vitals and nursing note reviewed.   Constitutional:       Appearance: She is well-developed.   HENT:      Head: Normocephalic and atraumatic.      Mouth/Throat:      Mouth: Mucous membranes are dry.   Eyes:      Pupils: Pupils are equal, round, and reactive to light.   Cardiovascular:      Heart sounds: Normal heart sounds.   Pulmonary:      Breath sounds: Normal breath sounds.   Abdominal:      General: Bowel sounds are normal.      Palpations: Abdomen  is soft.      Comments: Abdominal surgical scars noted, R periumbilical tenderness   Musculoskeletal:         General: Normal range of motion.      Cervical back: Neck supple.   Skin:     General: Skin is dry.      Capillary Refill: Capillary refill takes less than 2 seconds.   Neurological:      General: No focal deficit present.      Mental Status: She is alert and oriented to person, place, and time.      Cranial Nerves: No cranial nerve deficit.      Sensory: No sensory deficit.      Motor: No weakness.      Coordination: Coordination normal.   Psychiatric:         Mood and Affect: Mood normal.         Behavior: Behavior is cooperative.         ED Course          Laboratory Results   Labs Reviewed - No data to display    Imaging Results     No orders to display       Administered Medications     Medication Administration from 12/19/2021 0840 to 12/19/2021 0925     None          Procedures   Procedural Sedation  Procedures    MDM  {AVW:09811}    Clinical Impression   No diagnosis found.      Prescriptions     New Prescriptions    No medications on file       Disposition and Follow-up   Disposition: Refresh note to pull in Disposition    Future Appointments   Date Time Provider Department Center   01/30/2022 11:30 AM Alonna Buckler., MD SUR GEN 102 SURGERY       Follow up with:  No follow-up provider specified.    Return precautions are specified on After Visit Summary.

## 2021-12-19 NOTE — Consults
MD Appointment Request/Dr. Gunnar Bulla EMERGENCY DEPARTMENT/[place]    Patient Name: Donna Gross    MRN: [id number]   Request: pain management    Insurance: [place] [employer] / [facility]    Diagnosis: abdominal pain    Contact Number: [phone]    Time Frame: one week       Thank you!     Email sent to [facility] to arrange followup appointment.

## 2021-12-19 NOTE — ED Notes
COLLECTIVE?NOTIFICATION?12/19/2021 08:40?Donna Gross?MRN: [id number]    [facility] Donna Gross's patient encounter information:   EUM:[id number]  Account [id number]  Billing Account [id number]      Criteria Met      2 Visits in 30 Days    6 Visits in 180 Days    Security and Safety  No Security Events were found.  ED Care Guidelines  There are currently no ED Care Guidelines for this patient. Please check your facility's medical records system.    Flags      [employer] enrolled with [facility] - Patient is currently enrolled in the Medi-Cal Enhanced Care Management Program with [facility]. Hours of operation: Monday-Friday 8am-5pm. Program phone number [phone]. Please call to coordinate care. / Attributed By: [employer] / Attributed On: 12/03/2021         Prescription Drug Data  No Prescription Drug Data was found.    E.D. Visit Count (12 mo.)  Facility Visits   [facility] 1   [facility] 1   [name] 6   [facility]   Total 15   Note: Visits indicate total known visits.     Recent Emergency Department Visit Summary  Showing 10 most recent visits out of 15 in the past 12 months   Date Facility [facility] Type Diagnoses or Chief Complaint    Dec 19, 2021  [facility]  [place]     Dec 16, 2021  [name]  [place]  Chief Complaint: [name] AND ABD PAIN    Nov 08, 2021  [name]  [place]  Chief Complaint: CHEST PAIN    Nov 02, 2021  [name]  [place]  Chief Complaint: CHEST PAIN, AB PAIN ,BACK PAIN, VOMITING    Aug 07, 2021  [name]  [place]  Chief Complaint: CHEST PAIN/ AB PAIN/ VOMITING    Jul 30, 2021  [facility]  [name]  [place]  Emergency  Chief Complaint: DIZZY    Apr 03, 2021  [name]  [place] Emergency  Chief Complaint: CHEST PAIN, HEADACHE, VOMITING    Mar 09, 2021  [name]  [place]  Emergency  Chief Complaint: CHEST;ABD PAIN/ VOMITTING    Mar 02, 2021  [facility].  [place]  Emergency      1. Abdominal Pain      1. Epigastric pain      Feb 10, 2021  [facility]  [place]  Emergency      1. Abdominal Pain      1. Right upper quadrant pain      2. Other chronic pain        Recent Inpatient Visit Summary  Date Facility [facility] Type Diagnoses or Chief Complaint    Nov 08, 2021  [name]  [place]  Medical Surgical  Chief Complaint: ACUTE PANCREATITIS    Aug 07, 2021  [name]  [place]  Medical Surgical  Chief Complaint: CHEST PAIN/ AB PAIN/ VOMITING    Apr 11, 2021  [facility].  [place]  XRay      1. Hypotension, unspecified      1. Other specified abnormal findings of blood chemistry      3. Other specified injuries of head, initial encounter      4. Other hypotension      5. Hypovolemia        Care Team  [facility] Phone Fax Service Dates   BUTLER, Josem Kaufmann, M.D. Family Medicine   Current    Sela Hilding, M.D. Family Medicine   Current      This patient has registered at the [facility] Emergency Department   For more information visit: [email or web address]   PLEASE NOTE:     1.   Any care recommendations and other clinical information are provided as guidelines or for historical purposes only, and providers should exercise their own clinical judgment when providing care.    2.   You may only use this information for purposes of treatment, payment or health care operations activities, and subject to the limitations of applicable Collective Policies.    3.   You should consult directly with the organization that provided a care guideline or other clinical history with any questions about additional information or accuracy or completeness of information provided.    ? 2022 [place], [employer] - [email or web address]

## 2022-01-07 ENCOUNTER — Non-Acute Institutional Stay: Payer: PRIVATE HEALTH INSURANCE

## 2022-01-30 ENCOUNTER — Ambulatory Visit: Payer: PRIVATE HEALTH INSURANCE

## 2022-01-30 ENCOUNTER — Telehealth: Payer: PRIVATE HEALTH INSURANCE

## 2022-01-30 DIAGNOSIS — K8011 Calculus of gallbladder with chronic cholecystitis with obstruction: Secondary | ICD-10-CM

## 2022-01-30 NOTE — Telephone Encounter
PDL Call to Clinic    Reason for Call:patient returning [facility] call.    Appointment Related?  []  Yes  [x]  No     If yes;  Date:  Time:    Call warm transferred to PDL: [x]  Yes  []  No    Call Received by Clinic Representative:    If call not answered/not accepted, call received by Patient Services Representative:

## 2022-01-31 NOTE — Consults
DATE OF SERVICE:  01/30/2022     REASON FOR CONSULTATION:  Gallstone pancreatitis with recurrent episodes of biliary colic.    HISTORY OF PRESENT ILLNESS:  Donna Gross is a very pleasant 56 year old woman very well known to me. She had refractory ulcer disease. She presented with a gastric outlet obstruction. I performed a laparoscopic vagotomy and antrectomy with a gastrojejunostomy and a hiatal hernia repair. This required extensive adhesiolysis of the entirety of the visceral block. We kocherized her duodenum. She recovered from this really otherwise quite well. She had an episode of biliary pancreatitis over a year ago. She underwent ERCP with clearance of her duct. She was then referred for cholecystectomy. Dr. Ave Filter had planned on scheduling her for an open cholecystectomy, but eventually she did not have surgery for this. She had ERCP where they cleared the duct. She then had a repeat admission where she had an episode of recurrent pancreatitis secondary to likely gallstone passage. She is referred again for consideration for cholecystectomy and also to discuss potential hiatal hernia.    PAST MEDICAL HISTORY:  Significant for anxiety, depression, emphysema, fibromyalgia, gastritis, GERD, pancreatitis.    PAST SURGICAL HISTORY:  She had prior colectomy, multiple prior abdominal operations, most recently laparoscopic extensive adhesiolysis of the entirety of the visceral block, hiatal hernia repair and truncal vagotomy and gastrojejunostomy.    ALLERGIES:  Hydrocodone, ibuprofen, morphine.    MEDICATIONS:  Include sucralfate, Maalox, clobetasol, fluoxetine, fluticasone/salmeterol, ipratropium/albuterol, metoclopramide, multivitamins, naloxone, ondansetron, oxycodone, pantoprazole, quetiapine, trazodone.    FAMILY HISTORY:  Significant for mother with heart disease, brother with diabetes, father with diabetes.    SOCIAL HISTORY:  She was accompanied by her daughter. She still smokes. She has a 10-year smoking history, 1 cigarette per day. No alcohol use currently. No drug use.    REVIEW OF SYSTEMS:  Otherwise significant for the biliary symptoms.    OBJECTIVE:  General: On evaluation, patient is comfortable, in no acute distress. Respiratory: Breathing is unlabored. Abdomen: With some asymmetry, but no hernia. She has no current abdominal pain. Extremities: She has no cyanosis, clubbing, or edema.    IMAGING:  CT scan was reviewed, demonstrating no evidence of any abdominal ventral hernias. She does have a gallbladder with gallstones. She has normal stomach with anatomy consistent with her prior gastrojejunostomy. She does not have any significant hiatal hernia that I appreciate on the CT scan, with appropriate positioning of her stomach.     Her endoscopies: Most recent endoscopy was reviewed. There is no evidence of any overt significant hiatal hernia noted.    ASSESSMENT AND PLAN:  This is a very pleasant 56 year old woman with previous episodes of biliary pancreatitis with cholelithiasis. She also had extensive abdominal surgical history with known extensive adhesions. I discussed with Donna Gross that it would be difficult but still possible to perform a laparoscopic cholecystectomy. I would start laparoscopically assuming that we would likely need to lyse adhesions to access the gallbladder, but if this is possible, it would obviate the need for open repair. I am not concerned about any hiatal hernia and discussed with Donna Gross that I am not really excited to readdress the hiatus given the extensive adhesions previously. There are no abdominal wall hernias that need to be addressed at least from her CT scan, but we can assess this intraoperatively at the time of surgery. She understands the risks of surgery, the possibility of converting to an open operation. We will plan on scheduling her for surgery as an inpatient given the  anticipated complexity. We will schedule her for surgery in the near future.      Dineen Kid [name], MD [phone])        DCC/MODL CONF#: [id number]  D: 01/30/2022 15:03:36 T: 01/30/2022 21:01:26 DOCUMENT: [id number]

## 2022-02-03 ENCOUNTER — Non-Acute Institutional Stay: Payer: PRIVATE HEALTH INSURANCE

## 2022-02-04 ENCOUNTER — Non-Acute Institutional Stay: Payer: PRIVATE HEALTH INSURANCE

## 2022-02-04 ENCOUNTER — Inpatient Hospital Stay: Payer: PRIVATE HEALTH INSURANCE

## 2022-02-04 DIAGNOSIS — K8020 Calculus of gallbladder without cholecystitis without obstruction: Secondary | ICD-10-CM

## 2022-02-04 DIAGNOSIS — K8011 Calculus of gallbladder with chronic cholecystitis with obstruction: Secondary | ICD-10-CM

## 2022-02-10 ENCOUNTER — Telehealth: Payer: PRIVATE HEALTH INSURANCE

## 2022-02-10 NOTE — Telephone Encounter
Good Afternoon,  Call Back Request      Reason for call back: Pt spoke with the dr on 2/10 and was told that someone from his office would reach out to her in regards to scheduling sx for a gallbladder removal. I did advise pt on her chart it shows she is scheduled for 5/1 but pt states that is too far out due to her having issues at this time. Pt would like to see if she can get a sooner sx date since she needs to get her gallbladder removed as soon as possible.     Any Symptoms:  [x]  Yes  []  No      o If yes, what symptoms are you experiencing:  Unable to eat, diarrhea  o  Duration of symptoms (how long):    o Have you taken medication for symptoms (OTC or Rx):  no    If call was taken outside of clinic hours:    [] Patient or caller has been notified that this message was sent outside of normal clinic hours.     [] Patient or caller has been warm transferred to the physician's answering service. If applicable, patient or caller informed to please call back if symptoms progress.  Patient or caller has been notified of the turnaround time of 1-2 business day(s).  Thank you.  CBN: [phone]

## 2022-02-10 NOTE — Telephone Encounter
Message to Practice/Provider      Message: Tanya from office let me know that Donna Gross will reach out to patient if there is a sooner date for surgery. Also to let patient know to seek emergency care if needed.     Return call is not being requested by the patient or caller.    Patient or caller has been notified of the turnaround time of 1-2 business day(s).

## 2022-02-13 ENCOUNTER — Inpatient Hospital Stay
Admit: 2022-02-13 | Discharge: 2022-02-15 | Disposition: A | Discharge status: Home / Self Care | Payer: PRIVATE HEALTH INSURANCE | Source: Home / Self Care

## 2022-02-13 ENCOUNTER — Non-Acute Institutional Stay: Payer: PRIVATE HEALTH INSURANCE

## 2022-02-13 DIAGNOSIS — K8020 Calculus of gallbladder without cholecystitis without obstruction: Secondary | ICD-10-CM

## 2022-02-13 LAB — Expedited COVID-19 and Influenza A B PCR: INFLUENZA B PCR: NOT DETECTED

## 2022-02-13 LAB — Lipase: LIPASE: 36 U/L (ref 13–69)

## 2022-02-13 LAB — Comprehensive Metabolic Panel
ASPARTATE AMINOTRANSFERASE: 34 U/L (ref 13–62)
UREA NITROGEN: 13 mg/dL (ref 7–22)

## 2022-02-13 LAB — Differential Automated: NEUTROPHIL PERCENT, AUTO: 48.4 (ref 0.00–0.50)

## 2022-02-13 LAB — CBC: HEMATOCRIT: 40.4 (ref 34.9–45.2)

## 2022-02-13 MED ADMIN — HYDROMORPHONE HCL 1 MG/ML IJ SOLN: .5 mg | INTRAVENOUS | @ 22:00:00 | Stop: 2022-02-13 | NDC 00409128331

## 2022-02-13 MED ADMIN — ACETAMINOPHEN 500 MG PO TABS: 1000 mg | ORAL | @ 23:00:00 | Stop: 2022-02-16

## 2022-02-13 MED ADMIN — LACTATED RINGERS IV BOLUS: 1000 mL | INTRAVENOUS | @ 23:00:00 | Stop: 2022-02-13 | NDC 00338011704

## 2022-02-13 MED ADMIN — HYDROMORPHONE HCL 1 MG/ML IJ SOLN: .5 mg | INTRAVENOUS | @ 19:00:00 | Stop: 2022-02-13 | NDC 00409128331

## 2022-02-13 MED ADMIN — ONDANSETRON HCL 4 MG/2ML IJ SOLN: 4 mg | INTRAVENOUS | @ 22:00:00 | Stop: 2022-02-13 | NDC 60505613000

## 2022-02-13 MED ADMIN — NICOTINE 14 MG/24HR TD PT24: 14 mg | TRANSDERMAL | @ 23:00:00 | Stop: 2022-02-16 | NDC 00536589588

## 2022-02-13 MED ADMIN — ONDANSETRON HCL 4 MG/2ML IJ SOLN: 4 mg | INTRAVENOUS | @ 19:00:00 | Stop: 2022-02-13 | NDC 60505613000

## 2022-02-13 NOTE — ED Provider Notes
[facility]  Emergency Department Service Report    Donna Gross 56 y.o. female , presents with Abdominal Pain      Triage   Arrived on 02/13/2022 at 8:33 AM   Arrived by Walk-in [14]    ED Triage Vitals   Temp Temp Source BP Heart Rate Resp SpO2 O2 Device Pain Score Weight   02/13/22 0835 02/13/22 0835 02/13/22 0835 02/13/22 0835 02/13/22 0835 02/13/22 0835 -- 02/13/22 0846 02/13/22 0846   36.8 ?C (98.2 ?F) Oral 110/72 80 18 96 %  Ten 67.6 kg (149 lb)       Pre hospital care:       Allergies   Allergen Reactions   ? Hydrocodone Other (See Comments)     ''burns a hole in stomach''  Tolerates hydromorphone   ? Ibuprofen Other (See Comments)     GI discomfort    ''hurts my stomach''   ? Morphine Itching     Tolerates hydromorphone       History   HPI     Patient is a 56 y.o. female with hx of COPD, PUD s/p multiple surgeries including laparscopic vagotomy, antrectomy and gastrojejunostomy, cholelithiasis, chronic pancreatitis who presents to the ED with complaint of RUQ abdominal pain for the past 9 days. Patient w/ known cholelithiasis and Pt states her surgeon told her to come to ED to expedite cholecystectomy if pain worsened. Pain is moderate, constant, worsening. Reports associated diarrhea and vomiting x 8 days. Pt hasn't been able to tolerate PO for 8-9 days. No fevers. No dysuria or hematuria. No chest pain. Allergic to morphine. Usually gets dilaudid. Hx provided by patient.      Chiropractor Used? No                        Past Medical History:   Diagnosis Date   ? Anxiety    ? Depression    ? Emphysema, unspecified (HCC/RAF)    ? Emphysema, unspecified (HCC/RAF)    ? Fibromyalgia    ? Gastritis    ? GERD (gastroesophageal reflux disease)    ? Pancreatitis         Past Surgical History:   Procedure Laterality Date   ? ABDOMINAL SURGERY     ? COLON SURGERY          Past Family History   family history includes Diabetes in her brother and father; Heart disease in her mother.                 Past Social History   she reports that she has been smoking cigarettes. She uses smokeless tobacco. She reports that she does not drink alcohol, does not use drugs, and does not engage in sexual activity.       Physical Exam   Physical Exam  Vitals and nursing note reviewed.   Constitutional:       General: She is not in acute distress.     Appearance: She is well-developed.   HENT:      Head: Normocephalic and atraumatic.   Eyes:      Conjunctiva/sclera: Conjunctivae normal.   Cardiovascular:      Rate and Rhythm: Normal rate and regular rhythm.   Pulmonary:      Effort: Pulmonary effort is normal. No respiratory distress.      Breath sounds: Normal breath sounds.   Abdominal:      Palpations: Abdomen  is soft.      Tenderness: There is abdominal tenderness in the right upper quadrant. There is no guarding or rebound.   Musculoskeletal:         General: Normal range of motion.      Cervical back: Normal range of motion and neck supple.   Lymphadenopathy:      Cervical: No cervical adenopathy.   Skin:     General: Skin is warm and dry.   Neurological:      Mental Status: She is alert.      Sensory: No sensory deficit.      Comments: Moving all four extremities.   Psychiatric:         Behavior: Behavior normal.         ED Course          Laboratory Results     Labs Reviewed   EXPEDITED COVID-19 AND INFLUENZA A B PCR, RESPIRATORY UPPER - Abnormal; Notable for the following components:       Result Value    COVID-19 PCR/TMA Detected (*)     All other components within normal limits    Narrative:     Influenza A and Influenza B negative results should be considered presumptive in samples that have a positive COVID-19 result.    Influenza A and Influenza B negative results should be considered presumptive in samples that have a positive COVID-19 result.    This test is intended for in vitro diagnostic use under FDA Emergency Use Authorization only. Results are for the presumptive identification of COVID-19, Influenza A, and Influenza B RNA. The [facility] is certified under the Clinical Laboratory Improvement Amendments of 1988 (CLIA-88) as qualified to perform high complexity clinical laboratory testing.   CBC (PERFORMABLE) - Abnormal; Notable for the following components:    Mean Corpuscular Hemoglobin 25.5 (*)     Red Cell Distribution Width-SD 55.6 (*)     Red Cell Distribution Width-CV 18.9 (*)     Platelet Count, Auto 425 (*)     Mean Platelet Volume 8.9 (*)     All other components within normal limits   LIPASE - Normal   CBC & AUTO DIFFERENTIAL    Narrative:     The following orders were created for panel order CBC & Plt & Diff.  Procedure                               Abnormality         Status                     ---------                               -----------         ------                     ZOX[[id number]]                          Abnormal            Final result               Differential, Automated[[id number]]  Final result                 Please view results for these tests on the individual orders.   COMPREHENSIVE METABOLIC PANEL   DIFFERENTIAL, AUTOMATED (PERFORMABLE)       Imaging Results     [place] abd right upper quadrant non-vascular   Final Result by Albin Fischer, MD (02/24 1007)   IMPRESSION:         1. Sludge and small stones in the gallbladder, no sonographic evidence of acute cholecystitis.   2. Borderline prominence of the common duct and central intrahepatic biliary tree. Hypoechoic structure in the distal common duct seen on single image, question sludge/stones versus artifact. Correlate with bilirubin levels and if indicated MRCP for    further evaluation.            Signed by: Albin Fischer   02/13/2022 10:07 AM          Administered Medications     Medication Administration from 02/13/2022 1914 to 02/13/2022 1418       Date/Time Order Dose Route Action Action by Comments     02/13/2022 1041 PST HYDROmorphone 1 mg/mL inj 0.5 mg 0.5 mg IV Push Given Glenna Fellows, RN --     02/13/2022 1041 PST ondansetron 4 mg/2 mL inj 4 mg 4 mg Intravenous Given Glenna Fellows, RN --     02/13/2022 1406 PST HYDROmorphone 1 mg/mL inj 0.5 mg 0.5 mg IV Push Given Glenna Fellows, RN --     02/13/2022 1406 PST ondansetron 4 mg/2 mL inj 4 mg 4 mg Intravenous Given Glenna Fellows, RN --          Procedures   Procedural Sedation  Procedures    Medical Decision Making   Donna Gross is a 56 y.o. female with hx of COPD, PUD s/p multiple surgeries including laparscopic vagotomy, antrectomy and gastrojejunostomy, cholelithiasis, chronic pancreatitis who presents to the ED with complaint of RUQ abdominal pain for the past 9 days.  I obtained right upper quadrant ultrasound to rule out cholecystitis or choledocho but showed only cholelithiasis.  I obtained labs to rule out transaminitis on pancreatitis which were also interpreted by me as normal.  I consulted  surgery for possible expedited cholecystectomy however they saw the patient determined there is no indication to do an urgent cholecystectomy.  Of note the patient is also COVID positive.  However she has no respiratory symptoms.  Due to p.o. intolerance and continued need for IV pain medication the patient was admitted.    MDM   ED Course:  Nursing note reviewed.  Previous medical records were obtained and reviewed by myself. They reveal a history of COPD, PUD s/p multiple surgeries including laparscopic vagotomy, antrectomy and gastrojejunostomy, cholelithiasis, chronic pancreatitis.    1015: I visited the patient to obtain history and perform the physical exam.   Orders placed for [place] abd RUQ, labs.   Patient will be administered dilaudid and zofran.      Laboratory Results (as interpreted by me):  COVID positive  No electrolyte abnormality or renal dysfunction.  Lipase WNL  No leukocytosis or anemia.      10:48 AM Consult with General Surgery. Spoke to about the patient?s presenting symptoms and exam findings.     Clinical Impression     1. Cholelithiasis          Prescriptions     New Prescriptions    No medications on file  Disposition and Follow-up   Disposition: Observation [10]    Future Appointments   Date Time Provider Department Center   05/05/2022 11:30 AM Alonna Buckler., MD SUR GEN 102 SURGERY       Follow up with:  No follow-up provider specified.    Return precautions are specified on After Visit Summary.    The documentation on this chart was performed by Marijean Bravo, scribed for Cleotis Lema., MD    02/13/2022 10:18 AM     I have reviewed this note as recorded by sr, who acted as medical scribe, and I attest that it is an accurate representation of my H&P and other events of the ED visit except as otherwise noted.               Cleotis Lema., MD  02/13/22 [phone]

## 2022-02-13 NOTE — ED Notes
Patient transported to US at this time.  

## 2022-02-13 NOTE — Consults
INPATIENT CONSULT/ GENERAL SURGERY / [facility] SERVICE [id number]    PATIENT:  Donna Gross  MRN:  [id number]  DOB:  04/13/1966  DATE OF SERVICE:  02/13/2022    GENERAL SURGERY ATTENDING PHYSICIAN:  Brooke Dare  REFERRING PHYSICIAN:  Cleotis Lema., MD        LOS: 0 days     REASON FOR CONSULT: RUQ pain    HPI:  Donna Gross is a 56 y.o. female with a past medical history of COPD, PUD c/b gastric outlet obstruction s/p multiple surgeries including laparscopic vagotomy, antrectomy and gastrojejunostomy (Dr. Prentice Docker), cholelithiasis, and gallstone pancreatitis who presents with 8 days of severe constant RUQ pain and PO intolerance. She was evaluated for cholecystectomy by Dr. [name] on 2/10 and has been booked for surgery in May.     For the last 8 days she has been experiencing constant severe RUQ pain associated with nausea, diarrhea, and anorexia. She has had no appetite and says he has not had any food in this time. She denies fevers, choluria, or acholic stool. She came to the ED today with the hope that she can have her cholecystectomy ahead of her scheduled elective surgery.     Of note, found to be COVID positive in the ED.     ALLERGIES:   Allergies   Allergen Reactions   ? Hydrocodone Other (See Comments)     ''burns a hole in stomach''  Tolerates hydromorphone   ? Ibuprofen Other (See Comments)     GI discomfort    ''hurts my stomach''   ? Morphine Itching     Tolerates hydromorphone       MEDICATIONS:  Current Facility-Administered Medications   Medication Dose Route Frequency   ? [COMPLETED] HYDROmorphone 1 mg/mL inj 0.5 mg  0.5 mg IV Push STAT   ? [COMPLETED] ondansetron 4 mg/2 mL inj 4 mg  4 mg Intravenous Once     Current Outpatient Medications   Medication Sig   ? aluminum-magnesium hydroxide-simethicone 400-400-40 mg/5 mL suspension Take 10 mLs by mouth every four (4) hours as needed. (Patient taking differently: Take 10 mLs by mouth every four (4) hours as needed for Indigestion.)   ? clobetasol 0.05% cream Apply topically three (3) times daily as needed (rash).   ? FLUoxetine 40 mg capsule Take 1 capsule (40 mg total) by mouth daily.   ? fluticasone-salmeterol 250-50 mcg/dose diskus Inhale 1 puff two (2) times daily as needed (SOB).   ? ipratropium-albuterol 20-100 mcg/act inhaler Inhale 2 puffs three (3) times daily as needed (SOB).   ? metoclopramide 10 mg tablet Take 1 tablet (10 mg total) by mouth three (3) times daily before meals.   ? multivitamin tablet Take 1 tablet by mouth daily.   ? Naloxone HCl 4 MG/0.1ML LIQD Call 911. Administer a single spray intranasally into one nostril for opioid overdose. May repeat in 3 minutes if patient is not breathing..   ? ondansetron ODT 4 mg disintegrating tablet Take 1 tablet (4 mg total) by mouth every six (6) hours as needed for Nausea or Vomiting.   ? oxyCODONE 5 mg tablet Take 1 tablet (5 mg total) by mouth every four (4) hours as needed for Moderate Pain (Pain Scale 4-6) or Severe Pain (Pain Scale 7-10). Max Daily Amount: 30 mg   ? oxyCODONE-acetaminophen 5-325 mg tablet Take 2 tablets by mouth every four (4) hours as needed. Max Daily Amount: 12 tablets   ? pantoprazole 40 mg DR tablet Take 1  tablet (40 mg total) by mouth daily.   ? quetiapine 100 mg tablet Take 1 tablet (100 mg total) by mouth two (2) times daily.   ? sucralfate 1 g/10 mL suspension Take 10 mLs (1 g total) by mouth three (3) times daily with meals and at bedtime.   ? traZODone 100 mg tablet Take 1 tablet (100 mg total) by mouth at bedtime.       PAST MEDICAL HISTORY:   Past Medical History:   Diagnosis Date   ? Anxiety    ? Depression    ? Emphysema, unspecified (HCC/RAF)    ? Emphysema, unspecified (HCC/RAF)    ? Fibromyalgia    ? Gastritis    ? GERD (gastroesophageal reflux disease)    ? Pancreatitis        PAST SURGICAL HISTORY:  Past Surgical History:   Procedure Laterality Date   ? ABDOMINAL SURGERY     ? COLON SURGERY         FAMILY HISTORY:  Family History   Problem Relation Age of Onset   ? Heart disease Mother    ? Diabetes Brother    ? Diabetes Father    ? Anesthesia problems Neg Hx    ? Malignant hypertension Neg Hx    ? Hypotension Neg Hx    ? Malignant hyperthermia Neg Hx    ? Pseudochol deficiency Neg Hx        SOCIAL HISTORY:  Social History     Tobacco Use   ? Smoking status: Every Day     Years: 10.00     Types: Cigarettes   ? Smokeless tobacco: Current   ? Tobacco comments:     1 cig per day   Substance Use Topics   ? Alcohol use: No   ? Drug use: No       REVIEW OF SYSTEMS: A 14 point review of systems has been completed and are negative, except for what is noted in the HPI.     PHYSICAL EXAM:  Vital Signs:  Temp:  [36.8 ?C (98.2 ?F)] 36.8 ?C (98.2 ?F)  Heart Rate:  [73-80] 73  Resp:  [18] 18  BP: (94-110)/(65-72) 94/65  NBP Mean:  [85] 85  SpO2:  [94 %-96 %] 94 %  Vitals:    02/13/22 0846   Weight: 149 lb (67.6 kg)         GENERAL: Comfortable, NAD.  HEENT: Normocephalic, atraumatic, conjunctiva/corneas clear, moist mucous membranes, no oral ulcerations/lesions.  CARDIOVASCULAR: Regular rate and rhythm.  PULMONARY: Normal respiratory excursions, not tachypneic, no labored breathing.  GI: Non-distended, soft, Tender to palpation RUQ, negative [name]'s, no guarding, no rebound  EXT: WWP, no c/c/e  NEURO: A&Ox3, moving all ext.  CENTRAL LINE:  None  DRAINS: None    DETAILED I&O:  No intake/output data recorded.  No intake or output data in the 24 hours ending 02/13/22 1325     LAB REVIEW:    WBC/Hgb/Hct/Plts:  6.10/12.8/40.4/425 (02/24 0925)   Na/K/Cl/CO2/BUN/Cr/glu:  135/4.0/101/21/13/1.14/96 (02/24 0925)   Lab Results   Component Value Date    ALT 19 02/13/2022    AST 34 02/13/2022    ALKPHOS 87 02/13/2022    BILITOT 0.3 02/13/2022               MICRO:  Recent Labs     02/13/22  0925   WBC 6.10     Recent Results (from the past 336 hour(s))   Expedited COVID-19 and Influenza  A B PCR, Respiratory Upper    Collection Time: 02/13/22 10:42 AM    Specimen: Nasopharyngeal; Respiratory, Upper   Result Value Ref Range    Specimen Type Respiratory, Upper     COVID-19 PCR/TMA Detected (A) Not Detected    Influenza A PCR Not Detected Not Detected    Influenza B PCR Not Detected Not Detected       IMAGING/STUDIES:   Below images were reviewed with surgical team.  [place] abd right upper quadrant non-vascular    Result Date: 02/13/2022  IMPRESSION: 1. Sludge and small stones in the gallbladder, no sonographic evidence of acute cholecystitis. 2. Borderline prominence of the common duct and central intrahepatic biliary tree. Hypoechoic structure in the distal common duct seen on single image, question sludge/stones versus artifact. Correlate with bilirubin levels and if indicated MRCP for further evaluation. Signed by: Albin Fischer   02/13/2022 10:07 AM         ASSESSMENT/PLAN:  Donna Gross is a 56 y.o. female with a past medical history of COPD, PUD c/b gastric outlet obstruction s/p multiple surgeries including laparscopic vagotomy, antrectomy and gastrojejunostomy (Dr. Prentice Docker), cholelithiasis, and gallstone pancreatitis who presents with 8 days of severe constant RUQ pain and PO intolerance. She was evaluated for cholecystectomy by Dr. [name] on 2/10 and has been booked for surgery in May.    She has no fevers, leukocytosis, US findings of cholecystitis, elevations in LFTs, or pain typical with biliary colic. Based on this clinical picture we do not have suspicion for cholecystitis, choledocholithiasis, or gallstone pancreatitis. Her pain and PO intolerance is not well explained by cholelithiasis, but she does not have any indications for surgery in the acute setting.     We recommend the following:   - PO challenge  - Pain control  - No indications for surgery requiring admission. She should follow up with Dr. [name] for her scheduled elective cholecystectomy as planned.   - Please page [phone] [facility] team) with any questions.     The patient was examined and discussed with General Surgery team & Surgical Attending Dr. Brooke Dare who agrees with assessment and plans listed above.    Author:  Huel Coventry, MD  1:25 PM 02/13/2022  Division of General Surgery  [facility]

## 2022-02-13 NOTE — H&P
FM Inpatient History and Physical    PATIENT:  Donna Gross   MRN:  [id number]  DOB:  1966-01-01  DATE OF SERVICE:  02/13/2022    ATTENDING PHYSICIAN: Elease Hashimoto., MD    SENIOR RESIDENT: Marlinda Mike, MD    RESIDENT PHYSICIAN: Lorrine Kin, MD     PRIMARY CARE PROVIDER: Kelle Darting, MD    REASON FOR ADMISSION:   Chief Complaint   Patient presents with    Abdominal Pain     Pt w/ a planned cholecystectomy, here for abd pain x '' a few days''. Pt states Dr [name] (GI surgeon) told her she could come to ED to have the surgery done if the pain got worse. Pt also having mucous emesis and diarrhea.        Subjective:     Liberti Appleton is a 56 y.o. female with hx COPD, PUD c/b gastric outlet obstruction s/p multiples surgeries including vagotomy,  hernia repair with Dr [name], colon perf s/p colostomy bag s/p reversal, gallstone pancreatitis,  pending cholecystectomy in May, presents to ED complaining of abdominal pain and PO intolerance, found to be Covid positive.     She states she was on her usual state of health until 8 days ago when she started with severe RUQ pain associated with NBNNB emesis . She states she has not eaten for the last 8 days, however she had two Pedialyte popsicles yesterday, and tolerated it well. She also has been  having diarrhea x8 days, non bloody, however she has noted mucus in stool.  For pain at home she has been taking percocet with some improvement.    Additionally, Hx COPD at home oxygen  1-2 L NC,   she has noted new onset dyspnea with walking at home  for the past 5 days, denies CP, orthopnea, PND, LE sweling. She states she has not been Covid vaccinated, no sick contacts exposure.    Has had gallbladder problems for one year, had stone removal per GI 03/2021    Denies Fevers, chills, sick contacts, decreased UOP.  Not COVID vaccinated   Notes new onset dyspnea with walking for the past 5 days, denies CP, orthopnea, PND, LE sweling.    Prior surgeries: abdomnal ulcer removed, hernia repair with Dr [name], colono perf s/p colostomy bag s/p reversal   Allergies: morphine and motrin  Social: smokes 1 cigarette /day x 20 years. Denies etoh , illicit drug use. Lives with daughter, [name].     Extended Emergency Contact Information  Primary Emergency Contact: Madilyn Hook [place]  Home Phone: [phone]  Mobile Phone: [phone]  Relation: Daughter      Review of Systems:  Pertinent items are noted in HPI.     Initial vitals in the ED were:      ED Triage Vitals   Temp Temp Source BP Heart Rate Resp SpO2 O2 Device Pain Score Weight   02/13/22 0835 02/13/22 0835 02/13/22 0835 02/13/22 0835 02/13/22 0835 02/13/22 0835 -- 02/13/22 0846 02/13/22 0846   36.8 ?C (98.2 ?F) Oral 110/72 80 18 96 %  Ten 67.6 kg (149 lb)       While in the ED, patient was given Dilaudid x1. Surg consulted  no indication for lap chole or any other procedure.      Past Medical History:   Diagnosis Date    Anxiety     Depression     Emphysema, unspecified (HCC/RAF)     Emphysema,  unspecified (HCC/RAF)     Fibromyalgia     Gastritis     GERD (gastroesophageal reflux disease)     Pancreatitis       Past Surgical History:   Procedure Laterality Date    ABDOMINAL SURGERY      COLON SURGERY        (Not in a hospital admission)    Allergies: Hydrocodone, Ibuprofen, and Morphine  Family History   Problem Relation Age of Onset    Heart disease Mother     Diabetes Brother     Diabetes Father     Anesthesia problems Neg Hx     Malignant hypertension Neg Hx     Hypotension Neg Hx     Malignant hyperthermia Neg Hx     Pseudochol deficiency Neg Hx      Social History     Socioeconomic History    Marital status: Single   Tobacco Use    Smoking status: Every Day     Years: 10.00     Types: Cigarettes    Smokeless tobacco: Current    Tobacco comments:     1 cig per day   Substance and Sexual Activity    Alcohol use: No    Drug use: No    Sexual activity: Never   Social History Narrative    Lives w/ mom and daughter Objective:     Vital signs in last 24 hours:  Temp:  [36.8 ?C (98.2 ?F)] 36.8 ?C (98.2 ?F)  Heart Rate:  [73-80] 73  Resp:  [18] 18  BP: (94-110)/(65-72) 94/65  NBP Mean:  [85] 85  SpO2:  [94 %-96 %] 94 %         Most recent blood gas:  No results found for: PHART, PCO2ART, PO2ART, BICARBART, BEART    Intake/Output last 2 shifts:  No intake/output data recorded.    No intake/output data recorded.    Physical Exam:    General:   appears stated age, cooperative, Well-appearing and NAD   Skin:   Skin color, texture, turgor normal. No rashes or lesions   Head:   Normocephalic, without obvious abnormality   Eyes:   PERRL, EOM's intact.   Ears:   normal TM's and external ear canals both ears and deferred   Nose:  no discharge, No drainage   Mouth:    MMM   Neck:  soft and supple   Back:  no C-spine, T-spine, or L-spine tenderness, no CVA tenderness.   Lungs:   rhonchi bilaterally, normal repiratory effort and no wheezes or crackles   Heart:   regular rate and rhythm, S1, S2 normal, no murmur, click, rub or gallop. No thrills on palpation.   Abdomen:    several surgical scars located midline, well healed, otherwise, soft, mildly tender to palpation RUQ, no guardiing, no peritoneal reaction, [name] sign negative   GU: Not performed   Extremities:    extremities normal, atraumatic, no cyanosis or edema   Neurologic:   Grossly normal   Psychiatric:   mood and affect are within normal limits, A&O x 4/4          Lines and Drains:  Peripheral IV 22 G Posterior;Right Hand (Active)   Insertion/Exit Site Assessment Clean;No Complications 02/13/22 1013   Line Status Patent 02/13/22 1013   Dressing Status Clean, dry, intact 02/13/22 1013   Dressing Intervention Initial dressing 02/13/22 1013       Laboratory Data:   Lab Results   Component  Value Date    WBC 6.10 02/13/2022    HGB 12.8 02/13/2022    HCT 40.4 02/13/2022    PLT 425 (H) 02/13/2022     Lab Results   Component Value Date    NA 135 02/13/2022    K 4.0 02/13/2022    CL 101 02/13/2022    CO2 21 02/13/2022    BUN 13 02/13/2022    CREAT 1.14 02/13/2022    GLUCOSE 96 02/13/2022      No results found for: GFR  Lab Results   Component Value Date    CALCIUM 9.6 02/13/2022     Lab Results   Component Value Date    PHOS 3.8 01/23/2020     Lab Results   Component Value Date    MG 1.5 04/11/2021     Lab Results   Component Value Date    AST 34 02/13/2022    ALT 19 02/13/2022    ALKPHOS 87 02/13/2022    BILITOT 0.3 02/13/2022    BILICON <0.2 12/19/2021    TOTPRO 7.8 02/13/2022    ALBUMIN 4.8 02/13/2022     Lab Results   Component Value Date    AMYLASE 93 12/19/2021    LIPASE 36 02/13/2022     No results for input(s): TROPONIN, BNP in the last 72 hours.  No results for input(s): PT, INR, APTT in the last 72 hours.  No results for input(s): TSH, HGBA1C in the last 72 hours.  No results for input(s): CHOL, CHOLHDL, CHOLDLCAL, CHOLDLQ, TRIGLY in the last 72 hours.      Microbiology:  @LASTPROC (LAB347])@  No results for input(s): BACULBLD in the last 72 hours.    Invalid input(s):  BACULUR,  BACULRSP    Other Studies:   [place] abd right upper quadrant non-vascular   Final Result by Albin Fischer, MD (02/24 1007)   IMPRESSION:         1. Sludge and small stones in the gallbladder, no sonographic evidence of acute cholecystitis.   2. Borderline prominence of the common duct and central intrahepatic biliary tree. Hypoechoic structure in the distal common duct seen on single image, question sludge/stones versus artifact. Correlate with bilirubin levels and if indicated MRCP for    further evaluation.            Signed by: Albin Fischer   02/13/2022 10:07 AM          Assessment & Plan:   Theadora Noyes is a 56 y.o. female with hx COPD, PUD c/b gastric outlet obstruction s/p multiples surgeries including vagotomy,  hernia repair with Dr [name], colon perf s/p colostomy bag s/p reversal, gallstone pancreatitis,  pending cholecystectomy in May, presents to ED complaining of abdominal pain and PO intolerance, found to be incidentally Covid positive.    #.Concerns for Biliary Colic in uncomplicated gallstone disease, POA  # Hx Cholelithiasis, POA  #Hx gallstone pancreatitis, POA  # Hx PUD c/b gastric outlet obstruction, POA    Pt with known hx cholelithiasis and multiples ED visits for Biliar colic presents  complaining of RUQ abdominal pain for the last 8 days, associated with non bloody emesis and poor po tolerance.  On exam afebrile,  mild ttp on RUQ, however no peritoneal reaction, or guarding, [name] sign negative,  RUQ US findings c/w sludge and small gallstones, no evidence of cholecystitis, also findings c/w hypoechoic structure in the distal common duct seen on single image,c/f sludge/stones versus artifact, however, low concern for obstruction or cholangitis  given she is afebrile, no jaundiced and wbc,  bili and lipase wnl. Low susp for cholecystitis, at this time given exam findings and [place]. Surgery consulted in the ED no surgical intervention indicated . Will admit for pain control.  - s/p 1l bolus LR, may consider   - Pain control  - Acetaminophen 1g q8h PRN mild pain  - Oxycodone 5/10 sliding scale moderate/severe pain  - Dilaudid 0.5 mg IV push q6h for breakthrough pain  - May consider MRCP if worsens, becomes febrile or new leukocytosis or increased bili  - Surgery consulted appreciate recs  - CLD advance as tolerated.    # Covid19 positive, POA  No vaccinated against Covid19, incidentally  Found to be positive to Covid in the ED. On exam no increase wob, on RA. Reports dyspnea when walking at home for the last five days. No meeting criteria for Remdesivir, if worsen may consider starting Remdesivir.  - cardiac monitoring  - Droplets precautions  - f/u Chest X ray    # Diarrhea, POA  Reports diarrhea for the last 8 days, likely viral gastroenteritis, consider C. Diff if worsen low susp given no hx antibiotcs exposure.  - f/u stool studies      # COPD, chronic, POA  # Tobacco use, POA  Hx of chronic COPD, every day smoker for the last 18 years. At home on Duonebs, fluticasone/salmeterol. At baseline on oxygen  1-2L as needed. On presentation on RA, PE no increase wob, bilateral rhonchi. Low concern for COPD exacerbation.  - continue home Ipatropium-albuterol 20-100 mcg inhaler BID  - continue home fluticasone-salmeterol 250-50 mcg/diskus 2 times as needed.    -  Nicotine patch   - f/u chest xray      # Hx chronic anxiety, POA  # Hx depression, POA  Appropriate mood, and good insight.  - continue home Seroquel 100 mg nightly     #Insomnia, POA  - continue home Trazadone    # Hx Fibromyalgia  - pain control as above    #. Skin: none      #FEN/GI: CLD     #Access: PIV    #Prophylaxis: SCD for now pending surgery and possible GI recs       #Social: Lives daughter, Emergency contact:  Extended Emergency Contact Information  Primary Emergency Contact: Madilyn Hook [place]  Home Phone: [phone]  Mobile Phone: [phone]  Relation: Daughter    #Code Status: Full Code    #Dispo: pending pain management.    Discussed with attending, Elease Hashimoto., MD., who agrees with the plan above.    Author:  Lorrine Kin, MD 02/13/2022 4:35 PM    I saw and evaluated the patient and I agree with the resident's findings and plan of care that we developed as outlined in the resident's Admission History and Physical Examination for the 55yo patient with COPD, PUD c/b gastric outlet obstruction s/p multiple surgeries, h/o colon perf s/p colostomy bag s/p reversal, h/o cholelithiasis, anxiety, depression, h/o fibromyalgia presenting with increased abdominal pain, diarrhea and Covid positivity.    Elease Hashimoto, MD.

## 2022-02-13 NOTE — ED Notes
Hospitalist MD team at bedside.

## 2022-02-14 ENCOUNTER — Non-Acute Institutional Stay: Payer: PRIVATE HEALTH INSURANCE

## 2022-02-14 LAB — Magnesium: MAGNESIUM: 1.5 meq/L (ref 1.4–1.9)

## 2022-02-14 LAB — Comprehensive Metabolic Panel
ANION GAP: 10 mmol/L (ref 8–19)
ESTIMATED GFR 2021 CKD-EPI: 63 mL/min/{1.73_m2} (ref 20–30)

## 2022-02-14 LAB — Lipase: LIPASE: 30 U/L (ref 13–69)

## 2022-02-14 LAB — Glucose,POC
GLUCOSE,POC: 80 mg/dL (ref 65–99)
GLUCOSE,POC: 79 mg/dL (ref 65–99)
GLUCOSE,POC: 77 mg/dL (ref 65–99)
GLUCOSE,POC: 84 mg/dL (ref 65–99)

## 2022-02-14 LAB — B-Type Natriuretic Peptide: BNP: 15 pg/mL (ref <100)

## 2022-02-14 LAB — CBC: MEAN CORPUSCULAR VOLUME: 82.9 fL (ref 79.3–98.6)

## 2022-02-14 LAB — Phosphorus: PHOSPHORUS: 3.3 mg/dL (ref 2.3–4.4)

## 2022-02-14 LAB — Differential Automated: ABSOLUTE MONO COUNT: 0.54 10*3/uL (ref 0.20–0.80)

## 2022-02-14 MED ADMIN — OXYCODONE HCL 5 MG PO TABS: 10 mg | ORAL | @ 08:00:00 | Stop: 2022-02-16 | NDC 00406055262

## 2022-02-14 MED ADMIN — LIDOCAINE 5 % EX PTCH: 1 | TRANSDERMAL | @ 06:00:00 | Stop: 2022-03-16 | NDC 00591352511

## 2022-02-14 MED ADMIN — HYDROMORPHONE HCL 1 MG/ML IJ SOLN: .5 mg | INTRAVENOUS | @ 02:00:00 | Stop: 2022-02-14 | NDC 00409128331

## 2022-02-14 MED ADMIN — FLUTICASONE-SALMETEROL 250-50 MCG/ACT IN AEPB: 1 | RESPIRATORY_TRACT | @ 19:00:00 | Stop: 2022-02-20

## 2022-02-14 MED ADMIN — TRAZODONE HCL 100 MG PO TABS: 100 mg | ORAL | @ 07:00:00 | Stop: 2022-02-16 | NDC 60687045411

## 2022-02-14 MED ADMIN — ALUM & MAG HYDROXIDE-SIMETH 400-400-40 MG/5ML PO SUSP: 15 mL | ORAL | @ 18:00:00 | Stop: 2022-02-16 | NDC 00121176230

## 2022-02-14 MED ADMIN — QUETIAPINE FUMARATE 100 MG PO TABS: 100 mg | ORAL | @ 18:00:00 | Stop: 2022-02-16 | NDC 60687034911

## 2022-02-14 MED ADMIN — ACETAMINOPHEN 500 MG PO TABS: 1000 mg | ORAL | @ 18:00:00 | Stop: 2022-02-16

## 2022-02-14 MED ADMIN — FLUOXETINE HCL 40 MG PO CAPS: 40 mg | ORAL | @ 23:00:00 | Stop: 2022-02-16 | NDC 60687065911

## 2022-02-14 MED ADMIN — QUETIAPINE FUMARATE 100 MG PO TABS: 100 mg | ORAL | @ 07:00:00 | Stop: 2022-02-16 | NDC 60687034911

## 2022-02-14 MED ADMIN — OXYCODONE HCL 5 MG PO TABS: 5 mg | ORAL | @ 18:00:00 | Stop: 2022-02-21 | NDC 00406055262

## 2022-02-14 MED ADMIN — FLUTICASONE-SALMETEROL 250-50 MCG/ACT IN AEPB: 1 | RESPIRATORY_TRACT | @ 05:00:00 | Stop: 2022-02-16

## 2022-02-14 MED ADMIN — LIDOCAINE VISCOUS HCL 2 % MT SOLN: 5 mL | ORAL | @ 19:00:00 | Stop: 2022-02-16 | NDC 50383036315

## 2022-02-14 MED ADMIN — OXYCODONE HCL 5 MG PO TABS: 5 mg | ORAL | @ 02:00:00 | Stop: 2022-02-14 | NDC 00406055262

## 2022-02-14 MED ADMIN — ACETAMINOPHEN 500 MG PO TABS: 1000 mg | ORAL | @ 06:00:00 | Stop: 2022-02-16 | NDC 00904673061

## 2022-02-14 MED ADMIN — FLUTICASONE-SALMETEROL 250-50 MCG/ACT IN AEPB: 1 | RESPIRATORY_TRACT | Stop: 2022-02-16

## 2022-02-14 MED ADMIN — LIDOCAINE 5 % EX PTCH: 1 | TRANSDERMAL | @ 18:00:00 | Stop: 2022-02-16

## 2022-02-14 MED ADMIN — HYDROMORPHONE HCL 2 MG PO TABS: 2 mg | ORAL | @ 16:00:00 | Stop: 2022-02-16 | NDC 42858030125

## 2022-02-14 MED ADMIN — PANTOPRAZOLE SODIUM 40 MG PO TBEC: 40 mg | ORAL | @ 18:00:00 | Stop: 2022-02-16 | NDC 68084081311

## 2022-02-14 MED ADMIN — HYDROMORPHONE HCL 1 MG/ML IJ SOLN: .5 mg | INTRAVENOUS | @ 08:00:00 | Stop: 2022-02-20 | NDC 00409128331

## 2022-02-14 MED ADMIN — NICOTINE 14 MG/24HR TD PT24: 14 mg | TRANSDERMAL | @ 18:00:00 | Stop: 2022-02-16 | NDC 00536589588

## 2022-02-14 NOTE — ED Notes
Patient refused to change unto a gown. Patient prefers to change into a gown when she have a room for privacy.    Patient is endorsing pain 9/10. Patient refused to have the tylenol and lidocaine patch.

## 2022-02-14 NOTE — Progress Notes
[facility] Progress Note    Primary Care Provider: Kelle Darting, MD  Attending Physician: Elease Hashimoto., MD  Senior Resident Physician: Marlinda Mike, MD  Junior Resident Physician: Lorrine Kin, MD    Chief complaint: abdominal pain    Subjective:     Overnight events:   - Had abdominal  pain at 8 pm, refused tylenol    - Oxy 10 mg x1, Oxy  5 mg x1   - Dilaudid 0.5 mg IV x2                   Today patient  - Continues to reports abdominal pain  - Refuses Tylenol PO, she requests tylenol IV, states tylenol PO gives her pain  - No SOB, chest pain, no diarrhea while in the hospital, last BM was yesterday before coming         Medications:     Continuous Medications:   Scheduled Medications:    ? acetaminophen  1,000 mg Oral Q8H   ? FLUoxetine  40 mg Oral Daily   ? fluticasone-salmeterol  1 puff Inhalation BID   ? nicotine  1 patch Transdermal Daily   ? pantoprazole  40 mg Oral Daily   ? QUEtiapine  100 mg Oral BID   ? traZODone  100 mg Oral QHS     PRN Medications: HYDROmorphone, ipratropium-albuterol, lidocaine, naloxone, ondansetron injection/IVPB, oxyCODONE **OR** oxyCODONE      Physical Exam:     Vital signs in last 24 hours:   Temp:  [36.7 ?C (98 ?F)-36.8 ?C (98.2 ?F)] 36.7 ?C (98 ?F)  Heart Rate:  [63-80] 70  Resp:  [16-20] 16  BP: (94-117)/(65-97) 104/71  NBP Mean:  [75-101] 81  SpO2:  [92 %-96 %] 92 %     I/O last 2 completed shifts:  In: 100 [P.O.:100]  Out: -     General:   appears stated age, cooperative, Well-appearing and NAD   Skin:   Skin color, texture, turgor normal. No rashes or lesions   Head:   Normocephalic, without obvious abnormality   Eyes:   PERRL, EOM's intact.   Ears:   deferred   Nose:  no discharge, No drainage   Mouth:   MMM   Neck:  soft and supple   Back:  no C-spine, T-spine, or L-spine tenderness, no CVA tenderness.   Lungs:   rhonchi bilaterally, normal repiratory effort and no wheezes or crackles   Heart:   regular rate and rhythm, S1, S2 normal, no murmur, click, rub or gallop. No thrills on palpation.   Abdomen:   several surgical scars located midline, well healed, otherwise, soft, mildly tender to palpation RUQ, no guarding, no peritoneal reaction, [name] sign negative + Bowel sounds   GU: Not performed   Extremities:    extremities normal, atraumatic, no cyanosis or edema   Neurologic:   Grossly normal   Psychiatric:   mood and affect are within normal limits, A&O x 4/4         Laboratory Data:     Recent Labs     02/13/22  0925   WBC 6.10   HGB 12.8   HCT 40.4   MCV 80.6   PLT 425*   NEUTPCT 48.4   NA 135   K 4.0   CL 101   CO2 21   BUN 13   CREAT 1.14   MG 1.5   CALCIUM 9.6     Recent Labs  02/13/22  0925   ALT 19   AST 34   BILITOT 0.3   ALKPHOS 87   ALBUMIN 4.8     No results for input(s): TSH, HGBA1C in the last 72 hours.  No results for input(s): CHOL, CHOLHDL, CHOLDLCAL, CHOLDLQ, TRIGLY in the last 72 hours.  Recent Labs     02/13/22  1728   BNP 15     No results for input(s): PT, INR, APTT in the last 72 hours.  No results for input(s): BACULBLD in the last 72 hours.    Invalid input(s):  Milinda Hirschfeld  Recent Labs     02/14/22  0548 02/14/22  0026 02/13/22  1718   GLUCOSEPOC 77 79 80       Urinalysis:  Invalid input(s): Tori Milks,  UAWBC    Studies:     Imaging:   XR chest ap portable (1 view)   Final Result by Liliane Shi, MD (02/24 1652)   IMPRESSION:     Multiple densities projecting over the chest, likely external, limiting the evaluation.      Normal heart size. Mildly tortuous thoracic aorta with atherosclerotic calcifications.   Normal lung volumes. Bibasilar opacities, right greater than left could represent consolidation or may be due to overlapping shadows. If there is persistent clinical concern repeat chest x-ray without external covering will be helpful.   No pleural effusions. No pneumothorax.    No acute osseous abnormalities.               Signed by: Liliane Shi   02/13/2022 4:52 PM      [place] abd right upper quadrant non-vascular   Final Result by Albin Fischer, MD (02/24 1007)   IMPRESSION:         1. Sludge and small stones in the gallbladder, no sonographic evidence of acute cholecystitis.   2. Borderline prominence of the common duct and central intrahepatic biliary tree. Hypoechoic structure in the distal common duct seen on single image, question sludge/stones versus artifact. Correlate with bilirubin levels and if indicated MRCP for    further evaluation.            Signed by: Albin Fischer   02/13/2022 10:07 AM      XR chest ap portable (1 view)    (Results Pending)       Assessment and Plan:     Donna Gross is a 56 y.o. female  with hx COPD, PUD c/b gastric outlet obstruction s/p multiples surgeries including vagotomy,  hernia repair with Dr [name], colon perf s/p colostomy bag s/p reversal, gallstone pancreatitis,  pending cholecystectomy in May, presents to ED complaining of abdominal pain and PO intolerance, found to be incidentally Covid positive.  ?  #.Concerns for Biliary Colic in uncomplicated gallstone disease, POA  # Hx Cholelithiasis, POA  #Hx gallstone pancreatitis, POA  # Hx PUD c/b gastric outlet obstruction, POA    Pt with known hx cholelithiasis and multiples ED visits for Biliar colic presents  complaining of RUQ abdominal pain for the last 8 days, associated with non bloody emesis and poor po tolerance.  On exam afebrile,  mild ttp on RUQ, however no peritoneal reaction, or guarding, [name] sign negative,  RUQ US findings c/w sludge and small gallstones, no evidence of cholecystitis, also findings c/w hypoechoic structure in the distal common duct seen on single image,c/f sludge/stones versus artifact, however, low concern for obstruction or cholangitis given she is afebrile, no jaundiced and wbc,  bili  and lipase wnl. Low susp for cholecystitis. Surgery consulted, no surgical intervention indicated at this time, also low susp for gallbladder etiology for her pain. Pt refusing oral medications and requesting IV dilaudid, patient extensively counseled about trialing oral medications. Will consult pain management for further pain recs.  - s/p 1L bolus LR, may consider   - Pain control  - Acetaminophen 1g q8h PRN mild pain  - Oxycodone 5/10 sliding scale moderate/severe pain  - Dilaudid 2 mg mg PO  q6h for breakthrough pain  - Trial GI cocktail (Maalox and lidocaine viscous) q6h PRN heartburn  - May consider MRCP if worsens, becomes febrile or new leukocytosis or increased bili  - Surgery consulted appreciate recs, -> signed off  - Pain management consulted appreciate recs -> no indication for IV opiods  ?  # Covid19 positive, POA, stable  No vaccinated against Covid19, incidentally  Found to be positive to Covid in the ED. Reports dyspnea when walking at home for the last five days. Unremarkable chest xray,on RA benign lung exam  No meeting criteria for Remdesivir, if worsen may consider starting Remdesivir.  - cardiac monitoring  - Droplets precautions  ?  # Diarrhea, POA, improving  Reports diarrhea for the last 8 days, likely viral gastroenteritis, consider C. Diff if worsen low susp given no hx antibiotcs exposure. No BM while in the hospital.  - f/u stool studies  ?  ?  # COPD, chronic, POA, stable  # Tobacco use, POA, stable  Hx of chronic COPD, every day smoker for the last 18 years. At home on Duonebs, fluticasone/salmeterol. At baseline on oxygen  1-2L as needed. Continues on  RA, benign lung exam. Low concern for COPD exacerbation.  - continue home Ipatropium-albuterol 20-100 mcg inhaler BID  - continue home fluticasone-salmeterol 250-50 mcg/diskus 2 times as needed.    -  Nicotine patch       # Hx chronic anxiety, POA  # Hx depression, POA  Appropriate mood, and good insight.  - continue home Seroquel 100 mg nightly   ?  #Insomnia, POA  - continue home Trazadone  ?  # Hx Fibromyalgia  - pain control as above  ?  #. Skin: none  ?  ?  #FEN/GI: Regular diet      #Access: PIV    #Prophylaxis: SCD will transition to  Lovenox      #Code: Full Code    DWA Elease Hashimoto., MD who agrees with the above assessment and plan unless otherwise noted in addendum.    Author:  Lorrine Kin, MD 02/14/2022 7:40 AM

## 2022-02-14 NOTE — Interdisciplinary
BOOST (Better Outcomes by Optimizing Safe Transitions)        BOOST Elements Best Practice / Interventions Status   RN   P1 # Problems with Medications   Teach back COMPLETED for High risk medications:   []  Insulin   []  Anticoagulants: ____  []  Opioids:____  []  Immunosuppressive drugs: _______  []  Pending      Teach back on PTA meds and new meds ordered while inpatient []  Completed  []  Pending  []  Needs reinforcement on (medication name/s): ________      Seen by DM educator? [x]  N/A  []  Yes  []  No   []  Pending   P2 # Psychological [place] Suicide Risk Assessment   No risk      Seen by Child psychotherapist?   [x]  N/A    []  Yes   []  No, awaiting for SW   Reason for consult: _____     P3 # Principal Diagnosis Review with patient/caregiver disease-specific education and signs & symptoms to watch for. []  Completed  [x]  Ongoing  []  Needs reinforcement    Identify markers for care needs upon discharge.  []  N/A   []  DME   []  O2 needs   []  presence of foley           []  Other:___      Notify MD of need/s.  []  N/A []  Completed   []  Other:_____          Education provided for: [x]  N/A         []  Drain: _____    []  Enteral feeding    []  Foley catheter  []  Leg bag    []  PICC      []  PortACath   []  Oxygen  []   PleurX    []  Wound Vac  []  Other:____   P4 # Physical Limitations PT consult ordered by MD    []  N/A  []  Yes  [x]  No Evaluated by PT    []  N/A  []  Yes  []  Pending  PT/OT recs:    ,        SLP consult ordered by MD  [x]  N/A  []  Yes  []  No  Evaluated by OT   []  N/A  []  Yes  []  Pending      Discharge DME ordered by MD    []  N/A    []  FWW       []  Portable oxygen           []  Other:___     []  Delivered to bedside    []  Delivered to home  []  Pending delivery    Transportation for discharge  [x]  Family/friend    []  Taxi/Uber   []  Ambulance      []  Notified CM of transport need  []  Pending confirmation   []  Set up and confirmed     P5 # Health Literacy Primary learner:   [x]  Patient   []  Other: Name: __________  Relationship: __________  Contact number: ________ Discharge support  []  Spouse:______  []  Family:_______  []  Caregiver: __________     P6 # Patient Support Identified primary caregiver:   Name: __________  Relationship: __________  Contact number: ________   SW consult requested for:  []  N/A     []  Homelessness  []  Caregiver assistance  []  Other     P7 # Prior Hospitalizations Patient was hospitalized within the last 6 months?   []  N/A []  Yes []  No  Reason for current hospitalization:   []  Missed dialysis  []  Missed MD appointment  []  Medication related   []  Other:______   Check Discharge Instructions for follow-up appointment with medical provider within 7 days of hospital discharge.     Review with patient/caregiver what to do and who to contact in the event of worsening or new symptoms (signs and symptoms to watch for, when to call MD, what number to call, when to call 911).     P8 # Palliative Care 1. Does the patient have uncontrolled symptoms related to their life-limiting illness?  []  N/A []  Yes []  No    2.   Is the patient in need of goals of care discussion?  []  N/A []  Yes []  No        Does the patient have Advance Directive?  []  Yes []  No  If no, request for SW consult.  []  Request placed   If 'YES' to both 1 and 2 - Recommend Palliative Care consult. []  Done    If 'NO' to both 1 and 2 - Continue to monitor for changes    If 'YES' to 1 and 'NO'  to 2 - Recommend appropriate avenue for symptom control.    If 'NO' to 1 and 'YES' to 2 - Encourage primary team to have Goals of Care conversation with the patient.

## 2022-02-14 NOTE — Other
Patient's Clinical Goal:   Clinical Goal(s) for the Shift: pain management, comfort, safety  Identify possible barriers to advancing the care plan: none  Stability of the patient: Moderately Unstable - medium risk of patient condition declining or worsening    Progression of Patient's Clinical Goal:   Problem/Goal:   Abdominal pain, COVID + asymptomatic   Hx - COPD, PUD c/b gastric outlet obstruction s/p multiples surgeries including vagotomy,  hernia repair with Dr [name], colon perf s/p colostomy bag s/p reversal, gallstone pancreatitis,  pending cholecystectomy in May  Response to Plan of Care and any changes in condition (clinical condition, any concerning assessments, events, interventions; response to POC effectiveness or changes made):    Pt a/o x 4, BMAT 3, per pt she is independent but now weak because of her condition.   Complaining on abdominal pain, dilaudid OV and oxycodone prn given.   Continues pulse oximetry - on RA.   Accuchecks q 6 hrs, clear liquid diet.   Stool sample pending   CXR in am pending, previous one was obscured by clothes, now pt has hospital gown.     Interdisciplinary communication (team member communication):        Psychosocial communication (family or patient issues potentially affecting care):        Plan and Disposition (progression toward specific goals of care/hospitalization and discharge):   D/c plan - home.

## 2022-02-14 NOTE — ED Notes
Patient requested for acetaminophen and lidocaine patch for the pain.

## 2022-02-14 NOTE — Progress Notes
INPATIENT CONSULT/ GENERAL SURGERY / [facility] SERVICE [id number]    PATIENT:  Donna Gross  MRN:  [id number]  DOB:  04/05/1966  DATE OF SERVICE:  02/14/2022    GENERAL SURGERY ATTENDING PHYSICIAN:  Brooke Dare  REFERRING PHYSICIAN:  Elease Hashimoto., MD        LOS: 0 days     REASON FOR CONSULT: RUQ pain    HPI:  Donna Gross is a 57 y.o. female with a past medical history of COPD, PUD c/b gastric outlet obstruction s/p multiple surgeries including laparscopic vagotomy, antrectomy and gastrojejunostomy (Dr. Prentice Docker), cholelithiasis, and gallstone pancreatitis who presents with 8 days of severe constant RUQ pain and PO intolerance. She was evaluated for cholecystectomy by Dr. [name] on 2/10 and has been booked for surgery in May.     For the last 8 days she has been experiencing constant severe RUQ pain associated with nausea, diarrhea, and anorexia. She has had no appetite and says he has not had any food in this time. She denies fevers, choluria, or acholic stool. She came to the ED today with the hope that she can have her cholecystectomy ahead of her scheduled elective surgery.     Of note, found to be COVID positive in the ED.     Subjective  2/25: Continues to complain of abdominal pain, tolerating sips of liquids although does say some pain after drinking. Afebrile.     MEDICATIONS:  Current Facility-Administered Medications   Medication Dose Route Frequency    acetaminophen tab 1,000 mg  1,000 mg Oral Q8H    aluminum-magnesium hydroxide-simethicone 400-400-40 mg/5 mL susp 15 mL  15 mL Oral Q6H PRN    FLUoxetine cap 40 mg  40 mg Oral Daily    fluticasone-salmeterol 250-50 mcg/act diskus inhaler 1 puff  1 puff Inhalation BID    [COMPLETED] HYDROmorphone 1 mg/mL inj 0.5 mg  0.5 mg IV Push STAT    [COMPLETED] HYDROmorphone 1 mg/mL inj 0.5 mg  0.5 mg IV Push STAT    HYDROmorphone tab 2 mg  2 mg Oral Q6H PRN    ipratropium-albuterol 20-100 mcg/act inh 2 puff  2 puff Inhalation TID PRN    [COMPLETED] lactated ringers IV soln bolus 1,000 mL  1,000 mL Intravenous Once    lidocaine 5% patch 1 patch  1 patch Transdermal Q24H PRN    naloxone 0.4 mg/mL inj 0.4 mg  0.4 mg IV Push PRN    nicotine 14 mg/24 hr patch 14 mg  1 patch Transdermal Daily    [COMPLETED] ondansetron 4 mg/2 mL inj 4 mg  4 mg Intravenous Once    [COMPLETED] ondansetron 4 mg/2 mL inj 4 mg  4 mg Intravenous Once    ondansetron 4 mg/2 mL inj 4 mg  4 mg Intravenous Q8H PRN    oxyCODONE tab 5 mg  5 mg Oral Q6H PRN    Or    oxyCODONE tab 10 mg  10 mg Oral Q6H PRN    pantoprazole DR tab 40 mg  40 mg Oral Daily    QUEtiapine tab 100 mg  100 mg Oral BID    traZODone tab 100 mg  100 mg Oral QHS    [DISCONTINUED] HYDROmorphone 1 mg/mL inj 0.5 mg  0.5 mg IV Push Q6H PRN    [DISCONTINUED] oxyCODONE tab 10 mg  10 mg Oral Q6H PRN    [DISCONTINUED] oxyCODONE tab 5 mg  5 mg Oral Q6H PRN       PHYSICAL  EXAM:  Vital Signs:  Temp:  [36.7 ?C (98 ?F)-36.8 ?C (98.2 ?F)] 36.8 ?C (98.2 ?F)  Heart Rate:  [63-84] 84  Resp:  [16-20] 16  BP: (94-117)/(65-97) 96/67  NBP Mean:  [75-101] 76  SpO2:  [92 %-95 %] 92 %  Vitals:    02/13/22 2300   Weight: 64.1 kg (141 lb 6.4 oz)   Height: 1.626 m (5' 4'')         GENERAL: Sleeping comfortably   HEENT: Normocephalic, atraumatic, conjunctiva/corneas clear, moist mucous membranes, no oral ulcerations/lesions.  CARDIOVASCULAR: Regular rate   PULMONARY: Normal respiratory excursions, not tachypneic, no labored breathing.  GI: Non-distended, soft, Tender to palpation Right mid abdomen, negative [name]'s, no guarding, no rebound  EXT: WWP, no c/c/e  NEURO: A&Ox3, moving all ext.  CENTRAL LINE:  None  DRAINS: None    DETAILED I&O:  I/O last 3 completed shifts:  In: 100 [P.O.:100]  Out: -     Intake/Output Summary (Last 24 hours) at 02/14/2022 0920  Last data filed at 02/13/2022 2300  Gross per 24 hour   Intake 100 ml   Output --   Net 100 ml        LAB REVIEW:    WBC/Hgb/Hct/Plts:  6.18/11.5/37.4/333 (02/25 0815)   Na/K/Cl/CO2/BUN/Cr/glu:  136/4.2/102/24/11/1.04/73 (02/25 0815)   Lab Results   Component Value Date    ALT 14 02/14/2022    AST 31 02/14/2022    ALKPHOS 74 02/14/2022    BILITOT 0.3 02/14/2022               MICRO:  Recent Labs     02/14/22  0815 02/13/22  0925   WBC 6.18 6.10     Recent Results (from the past 336 hour(s))   Expedited COVID-19 and Influenza A B PCR, Respiratory Upper    Collection Time: 02/13/22 10:42 AM    Specimen: Nasopharyngeal; Respiratory, Upper   Result Value Ref Range    Specimen Type Respiratory, Upper     COVID-19 PCR/TMA Detected (A) Not Detected    Influenza A PCR Not Detected Not Detected    Influenza B PCR Not Detected Not Detected       IMAGING/STUDIES:   Below images were reviewed with surgical team.  XR chest ap portable (1 view)    Result Date: 02/14/2022  IMPRESSION:  No radiographic evidence of acute cardiopulmonary pathology.  Signed by: Celesta Gentile   02/14/2022 9:04 AM    XR chest ap portable (1 view)    Result Date: 02/13/2022  IMPRESSION:  Multiple densities projecting over the chest, likely external, limiting the evaluation. Normal heart size. Mildly tortuous thoracic aorta with atherosclerotic calcifications. Normal lung volumes. Bibasilar opacities, right greater than left could represent consolidation or may be due to overlapping shadows. If there is persistent clinical concern repeat chest x-ray without external covering will be helpful. No pleural effusions. No pneumothorax. No acute osseous abnormalities. Signed by: Liliane Shi   02/13/2022 4:52 PM    [place] abd right upper quadrant non-vascular    Result Date: 02/13/2022  IMPRESSION: 1. Sludge and small stones in the gallbladder, no sonographic evidence of acute cholecystitis. 2. Borderline prominence of the common duct and central intrahepatic biliary tree. Hypoechoic structure in the distal common duct seen on single image, question sludge/stones versus artifact. Correlate with bilirubin levels and if indicated MRCP for further evaluation. Signed by: Albin Fischer 02/13/2022 10:07 AM         ASSESSMENT/PLAN:  Donna Gross is a  56 y.o. female with a past medical history of COPD, PUD c/b gastric outlet obstruction s/p multiple surgeries including laparscopic vagotomy, antrectomy and gastrojejunostomy (Dr. Prentice Docker), cholelithiasis, and gallstone pancreatitis who presents with 8 days of severe constant RUQ pain and PO intolerance. She was evaluated for cholecystectomy by Dr. [name] on 2/10 and has been booked for surgery in May.    She has no fevers, leukocytosis, US findings of cholecystitis, elevations in LFTs, or pain typical with biliary colic. Based on this clinical picture we do not have suspicion for cholecystitis, choledocholithiasis, or gallstone pancreatitis. Her pain and PO intolerance is not well explained by cholelithiasis, but she does not have any indications for surgery in the acute setting.     We recommend the following:   - Very low suspicion for gallbladder etiology for abdominal pain at this time  - Pain/nausea control  - No indications for surgery requiring admission. She should follow up with Dr. [name] for her scheduled elective cholecystectomy as planned.   - General surgery signing off at this time, please re-consult with any new questions or concerns  - Please page [phone] [facility] team) with any questions.     The patient was examined and discussed with General Surgery team & Surgical Attending Dr. Brooke Dare who agrees with assessment and plans listed above.    Author:  Theresa Mulligan, MD, MS  9:20 AM 02/14/2022  Division of General Surgery   Health System    Attending Addendum:  Agree with above, plan was discussed with housestaff

## 2022-02-14 NOTE — ED Notes
Report given to Adil RN for continuity of care. Admitting RN denies further questions.

## 2022-02-14 NOTE — Discharge Summary
Discharge Summary   Name: Donna Gross MRN: [id number] DOB: [birth year]   Admit Date: 02/13/2022 D/C Date: 02/15/2022      LOS:   0 days    Admit Attending: Elease Hashimoto, MD Discharge Attending: Elease Hashimoto., MD    PCP: Kelle Darting, MD Discharge Provider: Lajoyce Corners, MD     Inpatient Care Team/ Consults:    Inpatient Family Medicine - Teaching Service  Surgery   Outpatient Care Team  No care team member to display     Admission Diagnosis:  (reason for admission)  #.Concerns for Biliary Colic in uncomplicated gallstone disease, POA  # Hx Cholelithiasis, POA  #Hx gallstone pancreatitis, POA  # Hx PUD c/b gastric outlet obstruction, POA   # Covid19 positive, POA  #Diarrhea, POA  # COPD, chronic, POA  # Tobacco use, POA  # Hx chronic anxiety, POA  # Hx depression, POA  #Insomnia, POA  # Hx Fibromyalgia Discharge Diagnosis:  (conditions treated during hospitalization)  #.Biliary Colic in uncomplicated gallstone disease, POA  # Hx Cholelithiasis, POA  #Hx gallstone pancreatitis, POA  # Hx PUD c/b gastric outlet obstruction, POA   # Covid19 positive, POA  #Diarrhea, POA  # COPD, chronic, POA  # Tobacco use, POA  # Hx chronic anxiety, POA  # Hx depression, POA  #Insomnia, POA  # Hx Fibromyalgia   Disposition:  Home or Self care    Discharge Condition:  stable       HPI:   Donna Gross is a 56 y.o. female with hx COPD, PUD c/b gastric outlet obstruction s/p multiples surgeries including vagotomy,  hernia repair with Dr [name], colon perf s/p colostomy bag s/p reversal, gallstone pancreatitis,  pending cholecystectomy in May, presents to ED complaining of abdominal pain and PO intolerance, found to be Covid positive.     She states she was on her usual state of health until 8 days ago when she started with severe RUQ pain associated with NBNNB emesis . She states she has not eaten for the last 8 days, however she had two Pedialyte popsicles yesterday, and tolerated it well. She also has been  having diarrhea x8 days, non bloody, however she has noted mucus in stool.  For pain at home she has been taking percocet with some improvement.  ?  Additionally, Hx COPD at home oxygen  1-2 L NC,   she has noted new onset dyspnea with walking at home  for the past 5 days, denies CP, orthopnea, PND, LE sweling. She states she has not been Covid vaccinated, no sick contacts exposure.  ?  Has had gallbladder problems for one year, had stone removal per GI 03/2021  ?  Denies Fevers, chills, sick contacts, decreased UOP.  Not COVID vaccinated   Notes new onset dyspnea with walking for the past 5 days, denies CP, orthopnea, PND, LE sweling.  ?  Prior surgeries: abdomnal ulcer removed, hernia repair with Dr [name], colono perf s/p colostomy bag s/p reversal   Allergies: morphine and motrin  Social: smokes 1 cigarette /day x 20 years. Denies etoh , illicit drug use. Lives with daughter, [name].   ?  Extended Emergency Contact Information  Primary Emergency Contact: Madilyn Hook [place]  Home Phone: [phone]  Mobile Phone: [phone]  Relation: Daughter  ?  ?  Review of Systems:  Pertinent items are noted in HPI.   ?  Initial vitals in the ED were:  ED Triage Vitals   Temp Temp Source BP Heart Rate Resp SpO2 O2 Device Pain Score Weight   02/13/22 0835 02/13/22 0835 02/13/22 0835 02/13/22 0835 02/13/22 0835 02/13/22 0835 -- 02/13/22 0846 02/13/22 0846   36.8 ?C (98.2 ?F) Oral 110/72 80 18 96 % ? Ten 67.6 kg (149 lb)   ?  ?  While in the ED, patient was given Dilaudid x1. Surg consulted  no indication for lap chole or any other procedure.  ?      Hospital Course:    #.Concerns for Biliary Colic in uncomplicated gallstone disease, POA  # Hx Cholelithiasis, POA  #Hx gallstone pancreatitis, POA  # Hx PUD c/b gastric outlet obstruction, POA??  Pt with known hx cholelithiasis and multiples ED visits for Biliar colic presents?with RUQ abdominal pain for the last 8 days, associated with non bloody emesis and poor po tolerance. ?On exam pt remained afebrile, ?mild ttp on RUQ, however no peritoneal reaction, or guarding, [name] sign negative, ?RUQ US findings c/w sludge and small gallstones, no evidence of cholecystitis, also findings c/w?hypoechoic structure in the distal common duct seen on single image,c/f?sludge/stones versus artifact, however, low concern for obstruction or cholangitis given she is afebrile, no jaundiced and wbc, ?bili and lipase wnl. Low susp for cholecystitis. Surgery consulted, no surgical intervention indicated also low susp for gallbladder etiology for her pain. Pt was refusing oral medications and requesting IV dilaudid, patient extensively counseled about trialing oral medications. Pain management verbal recs stated no indication for IV opioids at this time. Of note, pt with loose stools (pending cultures) s/p 1L bolus LR day before discharge. Was HDS at time of d/c/   - Pain control  - Acetaminophen 1g q8h?PRN mild pain  - Oxycodone 5mg  (30 pills prescribed outpatient)  - Surgery consulted appreciate recs, -> signed off  - Pain management consulted appreciate recs -> no indication for IV opiods  ?  # Covid19 positive, POA, stable  No vaccinated against Covid19.?Incidentally?Found to?be positive to Covid in the ED 02/13/22. Reports dyspnea when walking at home for the last five days. Unremarkable chest xray,on RA benign lung exam inpatient. Did not meet criteria for remdesivir.  -?Droplets precautions  - Advised pt to self-isolate at home on discharge  - Encouraged oral hydration  ?  # Diarrhea, POA, improving  Reports diarrhea for the last 8 days, likely viral gastroenteritis. Only 1 BM noted from 02/24-02/26 hospitalization  - Stool studies pending (sent as a precaution)  ?  ?  # COPD, chronic, POA, stable  # Tobacco use, POA, stable  Hx of chronic COPD, every day smoker for the last 18 years. At home on Duonebs, fluticasone/salmeterol.?At baseline on oxygen ?1-2L as needed. Continues on  RA, benign lung exam. Low concern for COPD exacerbation.  - continued home Ipatropium-albuterol 20-100 mcg inhaler BID  - continued home fluticasone-salmeterol 250-50 mcg/diskus 2 times as needed.??  - ?Nicotine patch   ?  ?  # Hx chronic anxiety, POA  # Hx depression, POA  Appropriate mood,?and?good insight.  - continued home Seroquel 100 mg nightly?  ?  #Insomnia, POA  - continued home Trazadone  ?  #?Hx?Fibromyalgia  - pain control as above    Procedures & Operations Performed:   None    Relevant Imaging Studies (most recent results only):   Last CXR: XR chest ap portable (1 view)    Result Date: 02/14/2022  IMPRESSION:  No radiographic evidence of acute cardiopulmonary pathology.  Signed by: Lawson Fiscal  [place]   02/14/2022 9:04 AM    Last CT Chest: No CT Chest in the last 180 days  Last CT Abdomen: No CT Abdomen in the last 180 days  Last KUB: No KUB or XR Abd in the last 180 days    Relevant labs:     CBC:  Lab Results   Component Value Date    WBC 4.54 02/15/2022    HGB 11.0 (L) 02/15/2022    HCT 34.8 (L) 02/15/2022    PLT 347 02/15/2022     LFT:  Lab Results   Component Value Date    TOTPRO 6.1 02/15/2022    ALBUMIN 3.8 (L) 02/15/2022    BILITOT 0.3 02/15/2022    ALKPHOS 69 02/15/2022    AST 25 02/15/2022    ALT 15 02/15/2022       BMP:  Lab Results   Component Value Date    NA 139 02/15/2022    K 3.9 02/15/2022    CL 106 02/15/2022    CO2 24 02/15/2022    BUN 13 02/15/2022    CREAT 1.03 02/15/2022    CALCIUM 9.1 02/15/2022    GLUCOSE 87 02/15/2022       Cal/iCal/MG/Ph:  Lab Results   Component Value Date    CALCIUM 9.1 02/15/2022    MG 1.4 02/15/2022    PHOS 3.4 02/15/2022     Coags:  No results found for: PT, APTT, INR          None        Physical Exam:   BP 94/69  ~ Pulse 87  ~ Temp 36.7 ?C (98 ?F) (Oral)  ~ Resp 16  ~ Ht 1.626 m (5' 4'')  ~ Wt 64.1 kg (141 lb 6.4 oz)  ~ SpO2 93%  ~ BMI 24.27 kg/m?     General appearance: alert, appears stated age and cooperative  Neck: supple, symmetrical, trachea midline  Lungs: clear to auscultation bilaterally  Heart: regular rate and rhythm, S1, S2 normal, no murmur, click, rub or gallop  Abdomen: several surgical scars located midline, well healed otherwise, soft mildly TTP, no guarding, no peritoneal signs, negative murphey signs, + bowel sounds  Extremities: extremities normal, atraumatic, no cyanosis or edema  Neurologic: Grossly normal            Outpatient Provider To-Do List  [ ]  May consider MRCP if worsens, becomes febrile or new leukocytosis or increased bili    [ ]  GI referral    [ ]  Routine Health maintenance including COVID vaccination      LACE+ Score: 47 (02/15/22 1254) Moderate Risk of Readmission        Pending Labs   The following tests have been collected, but not yet resulted at the time of discharge.    Unresulted Labs (From admission, onward)     Start     Ordered    02/13/22 2000  Bact Enteric Pathogen Panel PCR, Stool  [[id number]]  Once,   Routine         02/13/22 1952              The following labs have a preliminary result at the time of discharge.  Please check back if the final results have changed.  Preliminary Resulted Labs (From admission, onward)    None        Discharge Medication List  Coumadin and Opiate Risk Assessment   This patient does not have coumadin on their discharge med list  Controlled meds:   Controlled Medications     Opioid Agonists Disp Start End     oxyCODONE 5 mg tablet    30 tablet 02/15/2022     Sig - Route: Take 1 tablet (5 mg total) by mouth every six (6) hours as needed for Moderate Pain (Pain Scale 4-6) or Severe Pain (Pain Scale 7-10). Max Daily Amount: 20 mg - Oral    Earliest Fill Date: 02/15/2022     oxyCODONE 5 mg tablet (Discontinued)     04/15/2021 02/15/2022    Sig - Route: Take 1 tablet (5 mg total) by mouth every four (4) hours as needed for Moderate Pain (Pain Scale 4-6) or Severe Pain (Pain Scale 7-10). Max Daily Amount: 30 mg - Oral    Class: No Print    Earliest Fill Date: 04/15/2021    Reason for Discontinue: Reorder Opioid Combinations Disp Start End     oxyCODONE-acetaminophen 5-325 mg tablet    15 tablet 12/19/2021     Sig - Route: Take 2 tablets by mouth every four (4) hours as needed. Max Daily Amount: 12 tablets - Oral    Earliest Fill Date: 12/19/2021         Discharge Medication List as of 02/15/2022 11:18 AM      START taking these medications    Details   acetaminophen 500 mg tablet Take 2 tablets (1,000 mg total) by mouth every eight (8) hours., Starting Sun 02/15/2022, Until Tue 03/17/2022, Normal         CONTINUE these medications which have NOT CHANGED    Details   aluminum-magnesium hydroxide-simethicone 400-400-40 mg/5 mL suspension Take 10 mLs by mouth every four (4) hours as needed., Starting Sun 07/02/2019, Normal      clobetasol 0.05% cream Apply topically three (3) times daily as needed (rash)., Historical Med      FLUoxetine 40 mg capsule Take 1 capsule (40 mg total) by mouth daily., Historical Med      fluticasone-salmeterol 250-50 mcg/dose diskus Inhale 1 puff two (2) times daily as needed (SOB)., Historical Med      ipratropium-albuterol 20-100 mcg/act inhaler Inhale 2 puffs three (3) times daily as needed (SOB)., Historical Med      metoclopramide 10 mg tablet Take 1 tablet (10 mg total) by mouth three (3) times daily before meals., Starting Mon 01/29/2020, Normal      multivitamin tablet Take 1 tablet by mouth daily., Starting Mon 01/29/2020, Normal      Naloxone HCl 4 MG/0.1ML LIQD Call 911. Administer a single spray intranasally into one nostril for opioid overdose. May repeat in 3 minutes if patient is not breathing.., Normal      ondansetron ODT 4 mg disintegrating tablet Take 1 tablet (4 mg total) by mouth every six (6) hours as needed for Nausea or Vomiting., Starting Mon 02/10/2021, Normal      oxyCODONE-acetaminophen 5-325 mg tablet Take 2 tablets by mouth every four (4) hours as needed. Max Daily Amount: 12 tablets, Starting Fri 12/19/2021, Normal      pantoprazole 40 mg DR tablet Take 1 tablet (40 mg total) by mouth daily., Historical Med      quetiapine 100 mg tablet Take 1 tablet (100 mg total) by mouth two (2) times daily., Starting 12/27/2015, Until Discontinued, No Print      sucralfate 1 g/10 mL suspension Take 10 mLs (1 g total) by mouth three (3) times daily with meals and at bedtime., Starting Tue 03/18/2021, Normal  traZODone 100 mg tablet Take 1 tablet (100 mg total) by mouth at bedtime., Historical Med      oxyCODONE 5 mg tablet Take 1 tablet (5 mg total) by mouth every four (4) hours as needed for Moderate Pain (Pain Scale 4-6) or Severe Pain (Pain Scale 7-10). Max Daily Amount: 30 mg, Starting Tue 04/15/2021, No Print            Requested Appointments  There are no active discharge follow-up orders for this encounter.   Scheduled Appointments  Future Appointments   Date Time Provider Department Center   05/05/2022 11:30 AM Alonna Buckler., MD SUR [facility] SUR[facility]ERY          Nutrition Recommendations & Malnutrition Assessment   I have seen and examined the patient and agree with the RD assessment detailed below:  Patient meets criteria for:      (current weight 64.1 kg (141 lb 6.4 oz), BMI (Calculated): 24.27;  ,  ). See RD notes for additional details.         The patient was not consulted or assessed by a Registered Dietitian (RD) during this admission.  Discharge Diet Orders: There are no active discharge diet orders for this encounter.   Skin/Wound         Discharge Activity Orders   There are no active discharge activity orders for this encounter.   Rehab Assessment   No PT evaluation this encounter          No OT evaluation this encounter          Case Manager/Home Health Assessment                  Home Health Orders  There are no active discharge home health orders for this encounter.   Goals of Care Note (if new for this encounter)     I have seen and examined the patient on their discharge day. I have reviewed, edited, and agree with the above discharge summary.  Lajoyce Corners, MD  02/15/2022 1:02 PM

## 2022-02-15 ENCOUNTER — Non-Acute Institutional Stay: Payer: PRIVATE HEALTH INSURANCE

## 2022-02-15 LAB — Differential Automated: ABSOLUTE MONO COUNT: 0.5 10*3/uL (ref 0.20–0.80)

## 2022-02-15 LAB — Glucose,POC
GLUCOSE,POC: 85 mg/dL (ref 65–99)
GLUCOSE,POC: 103 mg/dL — ABNORMAL HIGH (ref 65–99)
GLUCOSE,POC: 104 mg/dL — ABNORMAL HIGH (ref 65–99)
GLUCOSE,POC: 88 mg/dL (ref 65–99)
GLUCOSE,POC: 103 mg/dL — ABNORMAL HIGH (ref 65–99)

## 2022-02-15 LAB — CBC: HEMOGLOBIN: 11 g/dL — ABNORMAL LOW (ref 11.6–15.2)

## 2022-02-15 LAB — Comprehensive Metabolic Panel
ESTIMATED GFR 2021 CKD-EPI: 64 mL/min/{1.73_m2} (ref 37–113)
TOTAL PROTEIN: 6.1 g/dL (ref 6.1–8.2)

## 2022-02-15 LAB — Magnesium: MAGNESIUM: 1.4 meq/L (ref 1.4–1.9)

## 2022-02-15 LAB — Phosphorus: PHOSPHORUS: 3.4 mg/dL (ref 2.3–4.4)

## 2022-02-15 MED ORDER — OXYCODONE HCL 5 MG PO TABS
5 mg | ORAL_TABLET | Freq: Four times a day (QID) | ORAL | 0 refills | Status: AC | PRN
Start: 2022-02-15 — End: ?

## 2022-02-15 MED ORDER — ACETAMINOPHEN 500 MG PO TABS
1000 mg | ORAL_TABLET | Freq: Three times a day (TID) | ORAL | 0 refills | Status: AC
Start: 2022-02-15 — End: ?

## 2022-02-15 MED ADMIN — FLUOXETINE HCL 40 MG PO CAPS: 40 mg | ORAL | @ 16:00:00 | Stop: 2022-02-16 | NDC 60687065911

## 2022-02-15 MED ADMIN — FLUTICASONE-SALMETEROL 250-50 MCG/ACT IN AEPB: 1 | RESPIRATORY_TRACT | @ 16:00:00 | Stop: 2022-02-16 | NDC 00054032756

## 2022-02-15 MED ADMIN — ACETAMINOPHEN 500 MG PO TABS: 1000 mg | ORAL | @ 16:00:00 | Stop: 2022-02-16

## 2022-02-15 MED ADMIN — LACTATED RINGERS IV BOLUS: 1000 mL | INTRAVENOUS | @ 01:00:00 | Stop: 2022-02-15 | NDC 00338011704

## 2022-02-15 MED ADMIN — QUETIAPINE FUMARATE 100 MG PO TABS: 100 mg | ORAL | @ 16:00:00 | Stop: 2022-02-16 | NDC 60687034911

## 2022-02-15 MED ADMIN — ACETAMINOPHEN 500 MG PO TABS: 1000 mg | ORAL | Stop: 2022-02-16

## 2022-02-15 MED ADMIN — FLUTICASONE-SALMETEROL 250-50 MCG/ACT IN AEPB: 1 | RESPIRATORY_TRACT | @ 05:00:00 | Stop: 2022-02-16 | NDC 00054032756

## 2022-02-15 MED ADMIN — OXYCODONE HCL 5 MG PO TABS: 10 mg | ORAL | @ 20:00:00 | Stop: 2022-02-16 | NDC 00406055262

## 2022-02-15 MED ADMIN — QUETIAPINE FUMARATE 100 MG PO TABS: 100 mg | ORAL | @ 04:00:00 | Stop: 2022-02-16 | NDC 60687034911

## 2022-02-15 MED ADMIN — ACETAMINOPHEN 500 MG PO TABS: 1000 mg | ORAL | @ 07:00:00 | Stop: 2022-02-16

## 2022-02-15 MED ADMIN — TRAZODONE HCL 100 MG PO TABS: 100 mg | ORAL | @ 04:00:00 | Stop: 2022-02-16 | NDC 60687045411

## 2022-02-15 MED ADMIN — HYDROMORPHONE HCL 2 MG PO TABS: 2 mg | ORAL | @ 04:00:00 | Stop: 2022-02-16 | NDC 42858030125

## 2022-02-15 MED ADMIN — NICOTINE 14 MG/24HR TD PT24: 14 mg | TRANSDERMAL | @ 16:00:00 | Stop: 2022-02-16 | NDC 00536589588

## 2022-02-15 MED ADMIN — HYDROMORPHONE HCL 2 MG PO TABS: 2 mg | ORAL | @ 16:00:00 | Stop: 2022-02-16 | NDC 42858030125

## 2022-02-15 MED ADMIN — OXYCODONE HCL 5 MG PO TABS: 10 mg | ORAL | @ 13:00:00 | Stop: 2022-02-16 | NDC 00406055262

## 2022-02-15 MED ADMIN — PANTOPRAZOLE SODIUM 40 MG PO TBEC: 40 mg | ORAL | @ 16:00:00 | Stop: 2022-02-16 | NDC 68084081311

## 2022-02-15 MED ADMIN — OXYCODONE HCL 5 MG PO TABS: 10 mg | ORAL | @ 02:00:00 | Stop: 2022-02-16 | NDC 00406055262

## 2022-02-15 NOTE — Other
Patient's Clinical Goal:   Clinical Goal(s) for the Shift: VSS, safety, comfort, pain management  Identify possible barriers to advancing the care plan:none  Stability of the patient: Moderately Unstable - medium risk of patient condition declining or worsening    Progression of Patient's Clinical Goal:    Problem/Goal:  -Abdominal pain  -Covid     Response to Plan of Care and any changes in condition:  -A&Ox4, BMAT 3  -Pt remains asymptomatic for Covid  - Last BP after bolus was 114/72  -Pain managed with oxycodone PO, hydromorphone PO  -Stool sample collected and sent for analysis. Loose BM.   -Maintained enhanced droplet precautions  -last BS 85, no coverage needed during the shift    Interdisciplinary communication:  -MD notified for BP of 89/61    Psychosocial communication:  -none    Plan and Disposition:  -Continue pain management  -D/C to home pending medical stability

## 2022-02-15 NOTE — Discharge Instructions
You were admitted due to abdominal pain due biliary colic. We consulted with surgery. There were no indications for surgery.    We recommend following up with your primary care doctor in 1-2 weeks.    You were also found to be COVID positive on admission. Per CDC guidelines below:    ''If you test positive for COVID-19, stay home for at least 5 days and isolate from others in your home. You are currently on     You are likely most infectious during these first 5 days.    Wear a high-quality mask if you must be around others at home and in public.  Do not go places where you are unable to wear a mask. For travel guidance, see CDC?s Travel webpage.  Do not travel.  Stay home and separate from others as much as possible.  Use a separate bathroom, if possible.  Take steps to improve ventilation at home, if possible.  Don?t share personal household items, like cups, towels, and utensils.  Monitor your symptoms. If you have an emergency warning sign (like trouble breathing), seek emergency medical care immediately.  Learn more about what to do if you have COVID-19.  Ending Isolation  End isolation based on how serious your COVID-19 symptoms were. Loss of taste and smell may persist for weeks or months after recovery and need not delay the end of isolation.    If you had no symptoms  You may end isolation after day 5.''

## 2022-02-15 NOTE — Other
Patient's Clinical Goal:   Clinical Goal(s) for the Shift: pain management, comfort, safety  Identify possible barriers to advancing the care plan: none  Stability of the patient: Moderately Unstable - medium risk of patient condition declining or worsening    Progression of Patient's Clinical Goal:   Problem/Goal:   Abdominal pain, COVID + asymptomatic   Hx - COPD, PUD c/b gastric outlet obstruction s/p multiples surgeries including vagotomy,  hernia repair with Dr [name], colon perf s/p colostomy bag s/p reversal, gallstone pancreatitis,  pending cholecystectomy in May  Response to Plan of Care and any changes in condition (clinical condition, any concerning assessments, events, interventions; response to POC effectiveness or changes made):    Pt a/o x 4, BMAT 3, per pt she is independent but now weak because of her condition.   COVID+, asymptomatic  Continues pulse oximetry - on RA.   Complaining on abdominal pain, dilaudid PO, oxycodone prn given.   Accuchecks AC&HS, to monitor BS, no ISS, will be reassessed by primary team for need to continue.   Pt stated having loose stools immediatly after she eats. On regular diet, no nausea/vomiting.   Incontinent of stool       Interdisciplinary communication (team member communication):        Psychosocial communication (family or patient issues potentially affecting care):        Plan and Disposition (progression toward specific goals of care/hospitalization and discharge):   D/c plan - home.

## 2022-02-15 NOTE — Progress Notes
[facility] Progress Note    Primary Care Provider: Kelle Darting, MD  Attending Physician: Elease Hashimoto., MD  Senior Resident Physician: Marlinda Mike, MD  Junior Resident Physician: Lajoyce Corners, MD    Chief complaint: abdominal pain    Subjective:     Overnight events:   - S/p 1L bolus yesterday due to low Bps prior to dilaudid  - Stool cx in process  - Had abdominal  pain at 8 pm, refused tylenol    - Oxy 10 mg x2  - Dilaudid 2 mg PO x1    - Refused ATC tylenol                 Today patient  - ***Continues to reports abdominal pain  - Refuses Tylenol PO, she requests tylenol IV, states tylenol PO gives her pain  - No SOB, chest pain, no diarrhea while in the hospital, last BM was yesterday before coming         Medications:     Continuous Medications:   Scheduled Medications:    ? acetaminophen  1,000 mg Oral Q8H   ? FLUoxetine  40 mg Oral Daily   ? fluticasone-salmeterol  1 puff Inhalation BID   ? nicotine  1 patch Transdermal Daily   ? pantoprazole  40 mg Oral Daily   ? QUEtiapine  100 mg Oral BID   ? traZODone  100 mg Oral QHS     PRN Medications: aluminum-magnesium hydroxide-simethicone, HYDROmorphone, ipratropium-albuterol, lidocaine, lidocaine viscous, naloxone, ondansetron injection/IVPB, oxyCODONE **OR** oxyCODONE      Physical Exam:     Vital signs in last 24 hours:   Temp:  [36.3 ?C (97.3 ?F)-37.1 ?C (98.8 ?F)] 36.4 ?C (97.6 ?F)  Heart Rate:  [67-98] 67  Resp:  [16-20] 18  BP: (89-114)/(61-72) 100/63  NBP Mean:  [68-78] 75  SpO2:  [90 %-95 %] 93 %     I/O last 2 completed shifts:  In: 1400 [P.O.:400; IV Piggyback:1000]  Out: -     General:   appears stated age, cooperative, Well-appearing and NAD   Skin:   Skin color, texture, turgor normal. No rashes or lesions   Head:   Normocephalic, without obvious abnormality   Eyes:   PERRL, EOM's intact.   Ears:   deferred   Nose:  no discharge, No drainage   Mouth:   MMM   Neck:  soft and supple   Back:  no C-spine, T-spine, or L-spine tenderness, no CVA tenderness.   Lungs:   rhonchi bilaterally, normal repiratory effort and no wheezes or crackles   Heart:   regular rate and rhythm, S1, S2 normal, no murmur, click, rub or gallop. No thrills on palpation.   Abdomen:   several surgical scars located midline, well healed, otherwise, soft, mildly tender to palpation RUQ, no guarding, no peritoneal reaction, [name] sign negative + Bowel sounds   GU: Not performed   Extremities:    extremities normal, atraumatic, no cyanosis or edema   Neurologic:   Grossly normal   Psychiatric:   mood and affect are within normal limits, A&O x 4/4         Laboratory Data:     Recent Labs     02/15/22  0454   WBC 4.54   HGB 11.0*   HCT 34.8*   MCV 81.3   PLT 347   NEUTPCT 26.9   NA 139   K 3.9   CL 106   CO2 24  BUN 13   CREAT 1.03   MG 1.4   CALCIUM 9.1     Recent Labs     02/15/22  0454   ALT 15   AST 25   BILITOT 0.3   ALKPHOS 69   ALBUMIN 3.8*     No results for input(s): TSH, HGBA1C in the last 72 hours.  No results for input(s): CHOL, CHOLHDL, CHOLDLCAL, CHOLDLQ, TRIGLY in the last 72 hours.  Recent Labs     02/13/22  1728   BNP 15     No results for input(s): PT, INR, APTT in the last 72 hours.  No results for input(s): BACULBLD in the last 72 hours.    Invalid input(s):  Milinda Hirschfeld  Recent Labs     02/15/22  0457 02/14/22  2221 02/14/22  2220 02/14/22  1748   GLUCOSEPOC 88 103* 104* 85       Urinalysis:  Invalid input(s): Tori Milks,  UAWBC    Studies:     Imaging:   XR chest ap portable (1 view)   Final Result by Erle Crocker., MD (02/25 0904)   IMPRESSION:        No radiographic evidence of acute cardiopulmonary pathology.                Signed by: Celesta Gentile   02/14/2022 9:04 AM      XR chest ap portable (1 view)   Final Result by Liliane Shi, MD (02/24 1652)   IMPRESSION:     Multiple densities projecting over the chest, likely external, limiting the evaluation.      Normal heart size. Mildly tortuous thoracic aorta with atherosclerotic calcifications.   Normal lung volumes. Bibasilar opacities, right greater than left could represent consolidation or may be due to overlapping shadows. If there is persistent clinical concern repeat chest x-ray without external covering will be helpful.   No pleural effusions. No pneumothorax.    No acute osseous abnormalities.               Signed by: Liliane Shi   02/13/2022 4:52 PM      [place] abd right upper quadrant non-vascular   Final Result by Albin Fischer, MD (02/24 1007)   IMPRESSION:         1. Sludge and small stones in the gallbladder, no sonographic evidence of acute cholecystitis.   2. Borderline prominence of the common duct and central intrahepatic biliary tree. Hypoechoic structure in the distal common duct seen on single image, question sludge/stones versus artifact. Correlate with bilirubin levels and if indicated MRCP for    further evaluation.            Signed by: Albin Fischer   02/13/2022 10:07 AM          Assessment and Plan:     Mariabelen Pressly is a 56 y.o. female  with hx COPD, PUD c/b gastric outlet obstruction s/p multiples surgeries including vagotomy,  hernia repair with Dr [name], colon perf s/p colostomy bag s/p reversal, gallstone pancreatitis,  pending cholecystectomy in May, presents to ED complaining of abdominal pain and PO intolerance, found to be incidentally Covid positive.  ?  #.Concerns for Biliary Colic in uncomplicated gallstone disease, POA  # Hx Cholelithiasis, POA  #Hx gallstone pancreatitis, POA  # Hx PUD c/b gastric outlet obstruction, POA    Pt with known hx cholelithiasis and multiples ED visits for Biliar colic presents  complaining of RUQ abdominal pain for  the last 8 days, associated with non bloody emesis and poor po tolerance.  On exam afebrile,  mild ttp on RUQ, however no peritoneal reaction, or guarding, [name] sign negative,  RUQ US findings c/w sludge and small gallstones, no evidence of cholecystitis, also findings c/w hypoechoic structure in the distal common duct seen on single image,c/f sludge/stones versus artifact, however, low concern for obstruction or cholangitis given she is afebrile, no jaundiced and wbc,  bili and lipase wnl. Low susp for cholecystitis. Surgery consulted, no surgical intervention indicated at this time, also low susp for gallbladder etiology for her pain. Pt refusing oral medications and requesting IV dilaudid, patient extensively counseled about trialing oral medications. Pain management with no indications for opioids PO nor IV  - s/p 1L bolus LR, may consider   - Pain control  - Acetaminophen 1g q8h PRN mild pain  - Oxycodone 5/10 sliding scale moderate/severe pain  - Dilaudid 2 mg mg PO  q6h for breakthrough pain  - Trial GI cocktail (Maalox and lidocaine viscous) q6h PRN heartburn  - consider MRCP if worsens, becomes febrile or new leukocytosis or increased bili  - Surgery consulted appreciate recs, -> signed off - NTD  - Pain management consulted appreciate recs -> no indication for IV opiods  ?  # Covid19 positive, POA, stable  No vaccinated against Covid19, incidentally  Found to be positive to Covid in the ED. Reports dyspnea when walking at home for the last five days. Unremarkable chest xray,on RA benign lung exam  No meeting criteria for Remdesivir, if worsen may consider starting Remdesivir.  - cardiac monitoring  - Droplets precautions  ?  # Diarrhea, POA, improving  Reports diarrhea for the last 8 days, likely viral gastroenteritis, consider C. Diff if worsen low susp given no hx antibiotcs exposure. No BM while in the hospital.  - f/u stool studies  ?  ?  # COPD, chronic, POA, stable  # Tobacco use, POA, stable  Hx of chronic COPD, every day smoker for the last 18 years. At home on Duonebs, fluticasone/salmeterol. At baseline on oxygen  1-2L as needed. Continues on  RA, benign lung exam. Low concern for COPD exacerbation.  - continue home Ipatropium-albuterol 20-100 mcg inhaler BID  - continue home fluticasone-salmeterol 250-50 mcg/diskus 2 times as needed.    -  Nicotine patch       # Hx chronic anxiety, POA  # Hx depression, POA  Appropriate mood, and good insight.  - continue home Seroquel 100 mg nightly   ?  #Insomnia, POA  - continue home Trazadone  ?  # Hx Fibromyalgia  - pain control as above  ?  #. Skin: none  ?  ?  #FEN/GI: Regular diet      #Access: PIV    #Prophylaxis: SCD will transition to  Lovenox      #Code: Full Code    DWA Elease Hashimoto., MD who agrees with the above assessment and plan unless otherwise noted in addendum.    Author:  Lajoyce Corners, MD 02/15/2022 7:22 AM

## 2022-02-15 NOTE — Nursing Note
1558: BP 89/61, MD notified. Pt is asymptomatic, pain medications held.    1645: Administered 1L LR bolus.

## 2022-02-15 NOTE — Nursing Note
Review of Systems  N: alert and oriented x4  R: RA  C: Regular  GI: Soft, RUQ tenderness, no distention, last BM 2/25, incontinent  GU: Uses BSC  Skin: Intact  Psychosocial: Anxious, cooperative    Nursing Discharge Note  Donna Gross was admitted for Cholelithiasis [K80.20] on 02/13/22.  Pt is now discharged and going home.  She has a procedure date of 04/20/2022.    Last Vitals  BP 94/69  ~ Pulse 87  ~ Temp 36.7 C (98 F) (Oral)  ~ Resp 16  ~ Ht 1.626 m (5' 4'')  ~ Wt 64.1 kg (141 lb 6.4 oz)  ~ SpO2 93%  ~ BMI 24.27 kg/m      Pain score: 9 of 10, oxycodone 10 mg po administered prior to discharge    Discharge Medications   Acetaminophen   Oxycodone   Electronic prescription handed to pt by this nurse    Patient Education   Reviewed AVS with pt    When to call MD/go to ER/call 911   Side effects/administration instructions reviewed for the following medications: oxycodone   Reminded pt to make appointment(s) to see PMD    Patient was free from injury.  IV site was removed.  All discharge instructions were given per [place]., MD and ample opportunity for questions was given to the patient.    Patient was escorted by staff transporter to lobby to be driven home at [phone].    Carlene Coria, BSN, RN-BC

## 2022-02-16 DIAGNOSIS — M797 Fibromyalgia: Secondary | ICD-10-CM

## 2022-02-16 DIAGNOSIS — K279 Peptic ulcer, site unspecified, unspecified as acute or chronic, without hemorrhage or perforation: Secondary | ICD-10-CM

## 2022-02-16 DIAGNOSIS — K8020 Calculus of gallbladder without cholecystitis without obstruction: Secondary | ICD-10-CM

## 2022-02-16 DIAGNOSIS — R197 Diarrhea, unspecified: Secondary | ICD-10-CM

## 2022-02-16 DIAGNOSIS — J449 Chronic obstructive pulmonary disease, unspecified: Secondary | ICD-10-CM

## 2022-02-16 DIAGNOSIS — R1013 Epigastric pain: Secondary | ICD-10-CM

## 2022-02-16 DIAGNOSIS — U071 COVID-19: Secondary | ICD-10-CM

## 2022-02-16 DIAGNOSIS — G4700 Insomnia, unspecified: Secondary | ICD-10-CM

## 2022-02-17 LAB — Bacterial Enteric Pathogen Panel: SALMONELLA SPECIES: NOT DETECTED

## 2022-03-10 ENCOUNTER — Telehealth: Payer: PRIVATE HEALTH INSURANCE

## 2022-03-10 NOTE — Telephone Encounter
Call Back Request      Reason for call back:     Pt is wondering if she can move up her surgery with Dr. [name]. Pt currently scheduled for surgery on 5/01, pt is wanting a sooner surgery date. Please advise.  CBN: [phone]    Any Symptoms:  []  Yes  [x]  No       If yes, what symptoms are you experiencing:    o Duration of symptoms (how long):    o Have you taken medication for symptoms (OTC or Rx):      If call was taken outside of clinic hours:    [] Patient or caller has been notified that this message was sent outside of normal clinic hours.     [] Patient or caller has been warm transferred to the physician's answering service. If applicable, patient or caller informed to please call back if symptoms progress.  Patient or caller has been notified of the turnaround time of 1-2 business day(s).

## 2022-03-27 MED ADMIN — PHENYLEPHRINE HCL 10 MG/ML IV SOLN: INTRAVENOUS | @ 21:00:00 | Stop: 2022-03-27 | NDC 70121157705

## 2022-03-27 MED ADMIN — DROPERIDOL 2.5 MG/ML IJ SOLN: INTRAVENOUS | @ 22:00:00 | Stop: 2022-03-27 | NDC 00517970225

## 2022-03-27 MED ADMIN — SUCCINYLCHOLINE CHLORIDE 20 MG/ML IJ SOLN: INTRAVENOUS | @ 20:00:00 | Stop: 2022-03-27 | NDC 00781341195

## 2022-03-27 MED ADMIN — HYDROMORPHONE HCL 1 MG/ML IJ SOLN: INTRAVENOUS | @ 22:00:00 | Stop: 2022-03-27 | NDC 00409128331

## 2022-03-27 MED ADMIN — FENTANYL CITRATE (PF) 100 MCG/2ML IJ SOLN: INTRAVENOUS | @ 20:00:00 | Stop: 2022-03-27 | NDC 00409909422

## 2022-03-27 MED ADMIN — ROCURONIUM BROMIDE 50 MG/5ML IV SOLN: INTRAVENOUS | @ 20:00:00 | Stop: 2022-03-27 | NDC 39822420002

## 2022-03-27 MED ADMIN — SCOPOLAMINE 1.5 MG (DELIVERS 1 MG OVER 3 DAYS) PATCH: 1 | TRANSDERMAL | @ 18:00:00 | Stop: 2022-03-30 | NDC 00378647099

## 2022-03-27 MED ADMIN — KETAMINE HCL 10 MG/ML IJ SOLN: INTRAVENOUS | @ 21:00:00 | Stop: 2022-03-27 | NDC 42023011310

## 2022-03-27 MED ADMIN — DEXAMETHASONE SODIUM PHOSPHATE 4 MG/ML IJ SOLN: INTRAVENOUS | @ 21:00:00 | Stop: 2022-03-27 | NDC 67457042312

## 2022-03-27 MED ADMIN — PLASMA-LYTE A IV SOLN: 50 mL/h | INTRAVENOUS | Stop: 2022-03-27

## 2022-03-27 MED ADMIN — DIPHENHYDRAMINE HCL 50 MG/ML IJ SOLN: 25 mg | INTRAVENOUS | Stop: 2022-03-28 | NDC 00641037621

## 2022-03-27 MED ADMIN — DROPERIDOL 2.5 MG/ML IJ SOLN: INTRAVENOUS | @ 21:00:00 | Stop: 2022-03-27

## 2022-03-27 MED ADMIN — PLASMA-LYTE A IV SOLN: INTRAVENOUS | @ 21:00:00 | Stop: 2022-03-27 | NDC 00338022104

## 2022-03-27 MED ADMIN — MIDAZOLAM HCL 10 MG/10ML IJ SOLN: INTRAVENOUS | @ 21:00:00 | Stop: 2022-03-27 | NDC 00409258705

## 2022-03-27 MED ADMIN — FENTANYL CITRATE (PF) 100 MCG/2ML IJ SOLN: INTRAVENOUS | @ 22:00:00 | Stop: 2022-03-27 | NDC 00409909422

## 2022-03-27 MED ADMIN — ACETAMINOPHEN 10 MG/ML IV SOLN: 1000 mg | INTRAVENOUS | @ 23:00:00 | Stop: 2022-03-28 | NDC 63323043441

## 2022-03-27 MED ADMIN — HYDROMORPHONE HCL 1 MG/ML IJ SOLN: .4 mg | INTRAVENOUS | @ 23:00:00 | Stop: 2022-03-28 | NDC 00409128331

## 2022-03-27 MED ADMIN — ROCURONIUM BROMIDE 50 MG/5ML IV SOLN: INTRAVENOUS | @ 22:00:00 | Stop: 2022-03-27 | NDC 39822420002

## 2022-03-27 MED ADMIN — LIDOCAINE HCL (PF) 1 % IJ SOLN: INTRAVENOUS | @ 21:00:00 | Stop: 2022-03-27 | NDC 63323049257

## 2022-03-27 MED ADMIN — ROCURONIUM BROMIDE 50 MG/5ML IV SOLN: INTRAVENOUS | @ 21:00:00 | Stop: 2022-03-27 | NDC 39822420002

## 2022-03-27 MED ADMIN — ONDANSETRON HCL 4 MG/2ML IJ SOLN: INTRAVENOUS | @ 22:00:00 | Stop: 2022-03-27 | NDC 60505613005

## 2022-03-27 MED ADMIN — SODIUM CHLORIDE 0.9 % IR SOLN: @ 21:00:00 | Stop: 2022-03-27 | NDC 00338004804

## 2022-03-27 MED ADMIN — PROPOFOL 200 MG/20ML IV EMUL: INTRAVENOUS | @ 20:00:00 | Stop: 2022-03-27 | NDC 63323026929

## 2022-03-27 MED ADMIN — SUGAMMADEX SODIUM 200 MG/2ML IV SOLN: INTRAVENOUS | @ 22:00:00 | Stop: 2022-03-27 | NDC 00006542312

## 2022-03-27 MED ADMIN — DEXTROSE 5 %/0.45 % NACL/20 MEQ KCL: 75 mL/h | INTRAVENOUS | @ 23:00:00 | Stop: 2022-03-29 | NDC 00338067104

## 2022-03-27 MED ADMIN — LIDOCAINE HCL (CARDIAC) 100 MG/5ML IV SOSY: INTRAVENOUS | @ 20:00:00 | Stop: 2022-03-27 | NDC 76329339001

## 2022-03-27 MED ADMIN — DROPERIDOL 2.5 MG/ML IJ SOLN: INTRAVENOUS | @ 21:00:00 | Stop: 2022-03-27 | NDC 00517970225

## 2022-03-27 MED ADMIN — CEFAZOLIN SODIUM 1 G IJ SOLR: INTRAVENOUS | @ 21:00:00 | Stop: 2022-03-27 | NDC 60505614205

## 2022-03-27 MED ADMIN — BUPIVACAINE HCL (PF) 0.5 % IJ SOLN: @ 22:00:00 | Stop: 2022-03-28 | NDC 00409116201

## 2022-03-27 MED ADMIN — PLASMA-LYTE A IV SOLN: 50 mL/h | INTRAVENOUS | @ 23:00:00 | Stop: 2022-03-27 | NDC 00338022104

## 2022-03-27 MED ADMIN — PROPOFOL 200 MG/20ML IV EMUL: INTRAVENOUS | @ 22:00:00 | Stop: 2022-03-27 | NDC 63323026929

## 2022-03-27 NOTE — H&P
UPDATED H&P REQUIREMENT    For [facility] and [name]    WHAT IS THE STATUS OF THE PATIENT'S MOST CURRENT HISTORY AND PHYSICAL?   - The most current H&P is >24 hours and <30 days, and having examined the patient, I attest that there have been no changes. (This suffices as an update to the H&P).      REFER TO MEDICAL STAFF POLICIES REGARDING PRE-PROCEDURE HISTORY AND PHYSICAL EXAMINATION AND UPDATED H&P REQUIREMENTS BELOW:    [facility] and [name] Medical Staff Policy 200 - For Patients Undergoing Procedures Requiring Moderate or Deep Sedation, General Anesthesia or Regional Anesthesia    Contents of a History and Physical Examination (H&P):    The H&P shall consist of chief complaint, history of present illness, allergies and medications, relevant social and family history, past medical history, review of systems and physical examination, and assessment and plan appropriate to the patient's age.    For Patients Undergoing Procedures Requiring Moderate or Deep Sedation, General Anesthesia or Regional Anesthesia:    1. An H&P shall be performed within 24 hours prior to the procedure by a qualified member of the medical staff or designee with appropriate privileges, except as noted in item 2 below.    2. If a complete history and physical was performed within thirty (30) calendar days prior to the patient's admission to the Medical Center for elective surgery, a member of the medical staff assumes the responsibility for the accuracy of the clinical information and will need to document in the medical record within twenty-four (24) hours of admission and prior to surgery or major invasive procedure, that they either attest that the history and physical has been reviewed and accepted, or document an update of the original history and physical relevant to the patient's current  clinical status.    3. Providing an H&P for patients undergoing surgery under local anesthesia is at the discretion of the Attending Physician.     4. When a procedure is performed by a dentist, podiatrist or other practitioner who is not privileged to perform an H&P, the anesthesiologist's assessment immediately prior to the procedure will constitute the 24 hour re-assessment.The dentist, podiatrist or other practitioner who is not privileged to perform an H&P will document the history and physical relevant to the procedure.    5. If the H&P and the written informed consent for the surgery or procedure are not recorded in the patient's medical record prior to surgery, the operation shall not be performed unless the attending physician states in writing that such a delay could lead to an adverse event or irreversible damage to the patient.    6. The above requirements shall not preclude the rendering of emergency medical or surgical care to a patient in dire circumstances.

## 2022-03-27 NOTE — Brief Op Note
Brief Operative/Procedure Note    Patient: Donna Gross    Date of Operation(s)/Procedure(s): 03/27/2022    Pre-op Diagnosis: Gallstones [K80.20]       Post-op Diagnosis: Same    Operation(s)/Procedure(s):  LAPAROSCOPIC CHOLECYSTECTOMY, LYSIS OF ADHESIONS    Surgeon(s) and Role:     Mardene Sayer., MD - Primary     * Penny Pia., MD - Surgeon Assistant - Resident    Anesthesia Staff and Role:  CRNA: Ames Coupe., CRNA  Anesthesia Resident: Haze Rushing, MD  Anesthesiologist: Harlow Asa., MD    Anesthesia Type:   Regional, General    Pre-Op Medications: Ancef    Intra-op Medications: (Antibiotics, Anticoagulants, Immunosuppressants)  ceFAZolin  dexamethasone    Blood Products: None    Fluids: 1L crystalloid    Estimated Blood Loss: Minimal    Findings: Extremely dense adhesions, >1.5 hours lysis of adhesions. Critical view obtained.    Complications: None; patient tolerated the operation(s)/procedure(s) well.                 Specimens:   ID Type Source Tests Collected by Time   1 : gallbladder Tissue Gallbladder TISSUE EXAM/FOREIGN BODY (AP) Mardene Sayer., MD 03/27/2022 1451     Drains: None         Staff and Role:   Circulating Nurse: Myer Haff, RN; Deitra Mayo, RN  Scrub Person: Annabelle Harman, RN; Vernelle Emerald D  Chaperone: Noel Gerold, MD    Date: 03/27/2022  Time: 3:14 PM

## 2022-03-28 LAB — Basic Metabolic Panel
CALCIUM: 8.9 mg/dL (ref 8.6–10.4)
CALCIUM: 8.9 mg/dL (ref 8.6–10.4)

## 2022-03-28 LAB — Phosphorus: PHOSPHORUS: 3.3 mg/dL (ref 2.3–4.4)

## 2022-03-28 LAB — Magnesium: MAGNESIUM: 1.5 meq/L (ref 1.4–1.9)

## 2022-03-28 LAB — Glucose,POC
GLUCOSE,POC: 129 mg/dL — ABNORMAL HIGH (ref 65–99)
GLUCOSE,POC: 138 mg/dL — ABNORMAL HIGH (ref 65–99)
GLUCOSE,POC: 146 mg/dL — ABNORMAL HIGH (ref 65–99)

## 2022-03-28 LAB — Differential Automated: LYMPHOCYTE PERCENT, AUTO: 18.2 (ref 0.20–0.80)

## 2022-03-28 LAB — CBC: HEMOGLOBIN: 10.1 g/dL — ABNORMAL LOW (ref 11.6–15.2)

## 2022-03-28 MED ADMIN — OXYCODONE HCL 5 MG PO TABS: 10 mg | ORAL | @ 17:00:00 | Stop: 2022-03-31 | NDC 68084035411

## 2022-03-28 MED ADMIN — FLUOXETINE HCL 20 MG PO CAPS: 40 mg | ORAL | @ 16:00:00 | Stop: 2022-03-31 | NDC 60687065911

## 2022-03-28 MED ADMIN — FLUOXETINE HCL 40 MG PO CAPS: 40 mg | ORAL | @ 16:00:00 | Stop: 2022-04-26 | NDC 60687065911

## 2022-03-28 MED ADMIN — FLUOXETINE HCL 40 MG PO CAPS: 40 mg | ORAL | @ 05:00:00 | Stop: 2022-04-26 | NDC 60687065911

## 2022-03-28 MED ADMIN — METHOCARBAMOL 750 MG PO TABS: 750 mg | ORAL | @ 15:00:00 | Stop: 2022-04-04 | NDC 60687056811

## 2022-03-28 MED ADMIN — ACETAMINOPHEN 325 MG PO TABS: 650 mg | ORAL | @ 18:00:00 | Stop: 2022-03-31 | NDC 00904677361

## 2022-03-28 MED ADMIN — ACETAMINOPHEN 325 MG PO TABS: 650 mg | ORAL | @ 13:00:00 | Stop: 2022-03-31 | NDC 00904677361

## 2022-03-28 MED ADMIN — PROCHLORPERAZINE EDISYLATE 10 MG/2ML IJ SOLN: 5 mg | INTRAVENOUS | @ 14:00:00 | Stop: 2022-04-27 | NDC 23155029442

## 2022-03-28 MED ADMIN — ONDANSETRON HCL 4 MG/2ML IJ SOLN: 4 mg | INTRAVENOUS | @ 05:00:00 | Stop: 2022-04-27 | NDC 60505613000

## 2022-03-28 MED ADMIN — HYDROMORPHONE HCL 1 MG/ML IJ SOLN: .4 mg | INTRAVENOUS | @ 03:00:00 | Stop: 2022-03-28 | NDC 00409128331

## 2022-03-28 MED ADMIN — HYDROMORPHONE HCL 1 MG/ML IJ SOLN: .2 mg | INTRAVENOUS | @ 18:00:00 | Stop: 2022-03-29 | NDC 00409128331

## 2022-03-28 MED ADMIN — PANTOPRAZOLE SODIUM 40 MG PO TBEC: 40 mg | ORAL | @ 15:00:00 | Stop: 2022-03-31 | NDC 68084081311

## 2022-03-28 MED ADMIN — ACETAMINOPHEN 325 MG PO TABS: 650 mg | ORAL | @ 07:00:00 | Stop: 2022-03-31 | NDC 00904677361

## 2022-03-28 MED ADMIN — PANTOPRAZOLE SODIUM 40 MG PO TBEC: 40 mg | ORAL | @ 05:00:00 | Stop: 2022-03-31 | NDC 68084081311

## 2022-03-28 MED ADMIN — ONDANSETRON HCL 4 MG/2ML IJ SOLN: 4 mg | INTRAVENOUS | @ 13:00:00 | Stop: 2022-03-31 | NDC 60505613000

## 2022-03-28 MED ADMIN — DEXTROSE 5 %/0.45 % NACL/20 MEQ KCL: 75 mL/h | INTRAVENOUS | @ 18:00:00 | Stop: 2022-03-29 | NDC 00338067104

## 2022-03-28 MED ADMIN — OXYCODONE HCL 5 MG/5ML PO SOLN: 5 mg | ORAL | @ 03:00:00 | Stop: 2022-03-28 | NDC 00121482705

## 2022-03-28 MED ADMIN — DEXTROSE 5 %/0.45 % NACL/20 MEQ KCL: 75 mL/h | INTRAVENOUS | @ 16:00:00 | Stop: 2022-03-29 | NDC 00338067104

## 2022-03-28 MED ADMIN — METHOCARBAMOL 750 MG PO TABS: 750 mg | ORAL | @ 20:00:00 | Stop: 2022-04-04 | NDC 60687056811

## 2022-03-28 MED ADMIN — FLUOXETINE HCL 20 MG PO CAPS: 40 mg | ORAL | @ 05:00:00 | Stop: 2022-04-26 | NDC 60687065911

## 2022-03-28 MED ADMIN — OXYCODONE HCL 5 MG PO TABS: 10 mg | ORAL | @ 09:00:00 | Stop: 2022-03-31 | NDC 68084035411

## 2022-03-28 MED ADMIN — HYDROMORPHONE HCL 1 MG/ML IJ SOLN: .2 mg | INTRAVENOUS | @ 13:00:00 | Stop: 2022-03-29 | NDC 00409128331

## 2022-03-28 MED ADMIN — HYDROMORPHONE HCL 1 MG/ML IJ SOLN: .2 mg | INTRAVENOUS | @ 05:00:00 | Stop: 2022-03-29 | NDC 00409128331

## 2022-03-28 NOTE — Progress Notes
General Surgery Progress Note [facility] [id number]   PATIENT: Donna Gross  MRN: [id number]  DOB: 1966/10/22  DATE OF SERVICE: 03/28/2022    HISTORY OF PRESENT ILLNESS   Curlie Sittner is a 56 y.o. female 1 Day Post-Op from the following procedure(s):  Procedure(s) (LRB):  LAPAROSCOPIC CHOLECYSTECTOMY, LYSIS OF ADHESIONS (N/A)    INTERVAL EVENTS   4/8: NAEON, AFVSS. Having significant pain so did not try any PO. Will add robaxin and encourage IS, ambulation.    OBJECTIVE   VITALS  BP 121/74  ~ Pulse 76  ~ Temp 37 ?C (98.6 ?F) (Temporal)  ~ Resp 20  ~ Ht 1.626 m (5' 4'')  ~ Wt 64.4 kg (141 lb 15.6 oz)  ~ SpO2 (!) 92%  ~ BMI 24.37 kg/m?     INTAKE/OUTPUT  I/O last 3 completed shifts:  In: 1200 [I.V.:1200]  Out: 425 [Urine:400; Blood:25]  Patient Lines/Drains/Airways Status     Active Drains:     None              PHYSICAL EXAM:  GENERAL: Comfortable, NAD.  HEENT: Face symmetric. Clear oropharinx.  CARDIOVASCULAR: Regular rate and rhythm.  PUMONARY: Normal respiratory excursions, not tachypneic, no labored breathing.  ABD: Non-distended, soft, appropriately tender. Incision c/d/i  GU: Voiding  EXT: WWP, no c/c/e  NEURO: A&Ox3, moving all ext.    MEDICATIONS:  Current Facility-Administered Medications   Medication Dose Route Frequency   ? acetaminophen tab 650 mg  650 mg Oral Q6H   ? dextrose 5%/0.45% NaCl/20 mEq KCL IV soln  75 mL/hr Intravenous Continuous   ? enoxaparin 40 mg/0.4 mL inj 40 mg  40 mg Subcutaneous Q24H   ? FLUoxetine cap 40 mg  40 mg Oral Daily   ? HYDROmorphone 1 mg/mL inj 0.2 mg  0.2 mg IV Push Q2H PRN   ? ipratropium-albuterol 20-100 mcg/act inh 2 puff  2 puff Inhalation TID PRN   ? methocarbamol tab 750 mg  750 mg Oral TID   ? ondansetron 4 mg/2 mL inj 4 mg  4 mg Intravenous Q8H PRN   ? oxyCODONE tab 5 mg  5 mg Oral Q6H PRN    Or   ? oxyCODONE tab 10 mg  10 mg Oral Q6H PRN   ? pantoprazole DR tab 40 mg  40 mg Oral Daily   ? prochlorperazine 10 mg/2 mL inj 5 mg  5 mg IV Push Q4H PRN   ? scopolamine 1.5 mg (delivers 1 mg over 3 days) patch 1 patch  1 patch Transdermal Q72H     LAB REVIEW  WBC/Hgb/Hct/Plts:  14.50/10.1/32.3/367 (04/08 0507)  Na/K/Cl/CO2/BUN/Cr/glu:  136/4.4/105/21/9/0.92/126 (04/08 0507)           IMAGING  No recent imaging reviewed.    ASSESSMENT/RECOMMENDATIONS   Donnetta Gillin is a 56 y.o. female h/o multipl eprior operations with extensive adhesions s/p ulcer operation, prior laparoscopy who developed cholelithiasis and chronic cholecystitis 1 Day Post-Op ex laparoscopy, LOA, and lap cholecystecotmy    Plan:  - CLD  - IS, OOB  - Ambulate  - Tylenol, Robaxin, oxycodone for pain    Patient was seen and examined with the Surgery Chief Resident, and the plan discussed with the Surgery Attending, Dr. [name], who agrees with the assessment/plan.    Author:   Letha Cape. Alvino Chapel, MD  03/28/2022  7:32 AM  General Surgery [facility] [phone]

## 2022-03-28 NOTE — Op Note
DATE OF OPERATION:  03/27/2022      PREOPERATIVE DIAGNOSIS:  1. Chronic cholecystitis.  2. Status post multiple abdominal operations including prior partial gastrectomy for perforated ulcer.  3. Status post prior extensive adhesiolysis and kocherization of the duodenum.    POSTOPERATIVE DIAGNOSIS:  1. Chronic cholecystitis.  2. Status post multiple abdominal operations including prior partial gastrectomy for perforated ulcer.  3. Status post prior extensive adhesiolysis and kocherization of the duodenum.   4. Extensive adhesions of the small bowel and colon to the anterior abdominal wall.  5. Extensive adhesions of the liver, duodenum, omentum and colon to the gallbladder.    NAME OF OPERATION:  1. Exploratory laparoscopy.  2. Extensive adhesiolysis of the small bowel, omentum, liver, colon, gallbladder, and stomach.   3. Laparoscopic cholecystectomy.      SURGEON:  Dineen Kid. [name], MD [phone])      ASSISTANT:  Pricilla Loveless, MD [phone])    ANESTHESIA:  General endotracheal anesthesia.    INDICATIONS:  Donna Gross is a very pleasant 56 year old woman. She is very well known to me. I had operated on her twice. She had history of multiple prior operations with extensive adhesions. I performed an ulcer operation for her. We performed a prior laparoscopy and lysed all of her tenacious adhesions to access the abdomen. She had significant adhesions and subsequently developed cholelithiasis with symptomatic cholelithiasis and chronic cholecystitis. The benefits, risks, and alternatives of surgery were discussed with her. She had extensive operations and as such we discussed the possibility that this would need to be performed as an open operation. We discussed that we would attempt this laparoscopically understanding that she would have very tenacious adhesions, but if she safely could tolerate this, this would confer easier recovery and decreased morbidity. She understood the risks of surgery including the possibility of enterotomy, the possible need to convert to an open operation, the possibility of risk to her bile duct, bleeding from the liver, and enteric risk given the extensive prior operation. She understood these considerations and consented for surgery.    OPERATIVE FINDINGS:  The patient had extensive tenacious adhesions of the small bowel, large bowel, omentum, liver and gallbladder, and stomach. We were able to, however, separate these progressively lysing all adhesions with combination of sharp dissection and LigaSure dissection. We eventually identified gallbladder that was distended with chronic cholecystitis. She had stones impacted in the neck of the gallbladder. We were able to perform a safe laparoscopic cholecystectomy after extensive adhesiolysis.    DESCRIPTION OF PROCEDURE:  After informed consent was obtained, the patient was taken to the operating room and placed supine on the operating room table. After general endotracheal anesthesia was initiated, the patient was prepped and draped in standard fashion. Patient had multiple prior laparotomies and multiple prior laparoscopies. We looked at her CT scan and identified that in the right lateral abdominal wall she had less adhesions with no specific bowel underneath the abdominal wall. We used a 5 mm optical trocar and entered into the abdomen through a right lateral incision. We accessed the peritoneal cavity. There were extensive adhesions immediately encountered, but we were able to dissect into an area of filmy adhesions. We created space with the optical trocar, insufflating with dry CO2 gas to a pressure of 15 mmHg. We slowly created a pocket using telescopic dissection taking down some of the areolar adhesions despite this being areolar, these adhesions were really quite tenacious and we did not wish to bluntly dissect any of  this. We inserted an additional 5 mm port in the right upper quadrant. We used a combination of sharp dissection with sharp cutting process of the LigaSure. We identified some areas with small vessels that could be seen that were controlled with the LigaSure. We progressively made a pocket releasing the small bowel and transverse colon from the anterior abdominal wall. This was quite extensive with adhesions encompassing every last square centimeter of the anterior abdominal wall. However, we were able to safely dissect this with thick but areolar adhesions completely progressively releasing this. This allowed [place] to dissect all the way to below the level of the umbilicus in the right side. We then placed an additional 5 mm port in the left periumbilical position. This allowed [place] to then turn our attention and dissect the undersurface of the upper abdominal wall. We lysed adhesions of the colon, small bowel, liver, gallbladder and stomach slowly separating all these structures meticulously using sharp dissection around any small bowel or colon and using some energy against the liver and omentum, we were able to completely clear this to the midline. The transverse colon was able to be released at the hepatic flexure and progressively dropped away. The duodenum had been previously kocherized. The IVC was clearly exposed. We then dissected to the midline. We released small bowel adhesions from the anterior surface taking this down sharply. We placed an 11 mm trocar in the midline. We then placed the patient in reverse Trendelenburg positioning. We were able to elevate up the gallbladder. This was quite distended and had stones impacted at the neck. We used a combination of cautery and the LigaSure to clear the neck of the gallbladder. We created a window behind the neck of the gallbladder isolating the cystic duct. The cystic artery was controlled with the LigaSure. We entered a critical view of the gallbladder. We then milked stones from the cystic duct into the gallbladder. An Endoclip was used to clip the cystic duct. This was then cut. The cystic artery was controlled with the LigaSure. We used a combination of LigaSure dissection, taking the gallbladder off the hepatic bed and cautery as well. We were able to dissect the gallbladder completely free of the liver. This was a distended gallbladder with chronic inflammation. This was then dropped in the EndoCatch bag and removed. The fascial defect was closed with 2-0 PDS suture in running fashion. We returned laparoscopically. The cystic duct had been controlled with clips. The common duct, common hepatic artery, and portal vein could all be visualized and had been skeletonized. The duodenum had been separated from the gallbladder and had been previously kocherized. We ensured hemostasis. We then removed all ports under direct visualization. We closed the midline fascia with 2-0 PDS suture. We closed skin incisions with 4-0 Monocryl suture. Local anesthetic was applied to each of the incisions. The sponge, needle, and instrument counts were all correct at the end of the case. The patient was awakened from anesthesia and taken to recovery room in stable condition.    COMPLICATIONS:  None.    ESTIMATED BLOOD LOSS:  15 cc.    SPECIMENS:  Gallbladder.    COMPLEXITY MODIFIER:  This operation required significantly more operative time and complexity to accomplish with extensive scarring of the gallbladder and the extensive adhesions requiring very meticulous dissection to safely enter into the abdomen with 2-1/2 hours of operative time with an hour and a half devoted to this complexity.      Dineen Kid [name], MD (  [id number])        DCC/MODL CONF#: [id number]  D: 03/27/2022 17:53:12 T: 03/27/2022 18:56:24 DOCUMENT: [id number]

## 2022-03-28 NOTE — Nursing Note
Report given to Jackie RN

## 2022-03-28 NOTE — Other
Patient's Clinical Goal:   Clinical Goal(s) for the Shift: VSS, pain & nauea mgmt, tolerate diet, ambulate  Identify possible barriers to advancing the care plan: pain management, post-op care  Stability of the patient: Moderately Unstable - medium risk of patient condition declining or worsening    Progression of Patient's Clinical Goal: Alert and oriented x4, 1L o2 nasal cannula, BMAT 3. Not passing gas per patient. C/o of post-op pain. Offered Oxycodone but pt refused. Preferred dilaudid 0.2 mg IVP. No IV access due to infiltration. Attempted to establish new access but failed x2. Paged stat RN for assistance. IS at bedside.

## 2022-03-28 NOTE — Other
Patient's Clinical Goal:   Clinical Goal(s) for the Shift: VSS, pain management, monitor for N/V, tolerate and advance diet, fall precaution  Identify possible barriers to advancing the care plan:none  Stability of the patient:Moderately Stable - low risk of patient condition declining or worsening  Progression of Patient's Clinical Goal:  Pt s/p Laparoscopic Cholecystectomy. A+Ox4, cardiac monitored on 1-2L O2 via NC. NSR. VSS, afebrile. BMAT 2-3 w/ gait belt and FWW, unsteady gait. Pt c/o stiffness and pain while attempting to ambulate to bathroom. [place] Dec stedy used. Scheduled Tylenol, Oxy PRN x1 and Dilaudid PRN x2 for pain. Voiding. Clear liquid diet; LBM 4/6. Pt c/o nausea. Zofran PRN given twice. Continous IVF. Lap sites x4 drsg dry and intact.   Plan: pain management + advance diet

## 2022-03-28 NOTE — Nursing Note
Pt A&Ox4, NSR on tele, on 1L NC.   Oob to [facility] w/ gait belt and min assist, ambulation encouraged, teaching provided regarding importance of mobilization after surgery for bowel function and preventing pna, pt stated she cannot tolerate walking at this time.  Pt only tolerating small sips of clears, mIVF cont'd.   Pt denies passing gas.   Abd lap sites x4 w/ gauze/tegaderm, small amount old drainage.   Pain managed w/ oxy prn x1, scheduled methocarbamol.   Report given to [facility], pt transferred to I[facility] rm [id number] w/ all belongings

## 2022-03-29 LAB — Glucose,POC
GLUCOSE,POC: 112 mg/dL — ABNORMAL HIGH (ref 65–99)
GLUCOSE,POC: 91 mg/dL (ref 65–99)
GLUCOSE,POC: 100 mg/dL — ABNORMAL HIGH (ref 65–99)
GLUCOSE,POC: 91 mg/dL (ref 65–99)

## 2022-03-29 MED ADMIN — FLUOXETINE HCL 20 MG PO CAPS: 40 mg | ORAL | @ 16:00:00 | Stop: 2022-03-31 | NDC 00904578561

## 2022-03-29 MED ADMIN — PANTOPRAZOLE SODIUM 40 MG PO TBEC: 40 mg | ORAL | @ 16:00:00 | Stop: 2022-03-31 | NDC 68084081311

## 2022-03-29 MED ADMIN — OXYCODONE HCL 5 MG PO TABS: 10 mg | ORAL | @ 16:00:00 | Stop: 2022-04-04 | NDC 68084035411

## 2022-03-29 MED ADMIN — METHOCARBAMOL 750 MG PO TABS: 750 mg | ORAL | @ 19:00:00 | Stop: 2022-03-31 | NDC 60687056811

## 2022-03-29 MED ADMIN — ENOXAPARIN SODIUM 40 MG/0.4ML IJ SOSY: 40 mg | SUBCUTANEOUS | @ 01:00:00 | Stop: 2022-03-31

## 2022-03-29 MED ADMIN — ACETAMINOPHEN 325 MG PO TABS: 650 mg | ORAL | @ 19:00:00 | Stop: 2022-03-31 | NDC 00904677361

## 2022-03-29 MED ADMIN — MAGNESIUM HYDROXIDE 400 MG/5ML PO SUSP: 30 mL | ORAL | @ 23:00:00 | Stop: 2022-03-31 | NDC 00121043130

## 2022-03-29 MED ADMIN — METHOCARBAMOL 750 MG PO TABS: 750 mg | ORAL | @ 12:00:00 | Stop: 2022-03-31 | NDC 60687056811

## 2022-03-29 MED ADMIN — OXYCODONE HCL 5 MG PO TABS: 10 mg | ORAL | @ 23:00:00 | Stop: 2022-03-31 | NDC 68084035411

## 2022-03-29 MED ADMIN — QUETIAPINE FUMARATE 100 MG PO TABS: 100 mg | ORAL | @ 03:00:00 | Stop: 2022-03-31 | NDC 50268063211

## 2022-03-29 MED ADMIN — ACETAMINOPHEN 325 MG PO TABS: 650 mg | ORAL | @ 06:00:00 | Stop: 2022-03-31 | NDC 00904677361

## 2022-03-29 MED ADMIN — METHOCARBAMOL 750 MG PO TABS: 750 mg | ORAL | @ 03:00:00 | Stop: 2022-04-04 | NDC 60687056811

## 2022-03-29 MED ADMIN — DEXTROSE 5 %/0.45 % NACL/20 MEQ KCL: 75 mL/h | INTRAVENOUS | @ 06:00:00 | Stop: 2022-03-29

## 2022-03-29 MED ADMIN — HYDROMORPHONE HCL 1 MG/ML IJ SOLN: .2 mg | INTRAVENOUS | Stop: 2022-04-04 | NDC 00409128331

## 2022-03-29 MED ADMIN — OXYCODONE HCL 5 MG PO TABS: 10 mg | ORAL | @ 03:00:00 | Stop: 2022-03-31 | NDC 68084035411

## 2022-03-29 MED ADMIN — HYDROMORPHONE HCL 1 MG/ML IJ SOLN: .2 mg | INTRAVENOUS | @ 10:00:00 | Stop: 2022-03-29 | NDC 00409128331

## 2022-03-29 MED ADMIN — ACETAMINOPHEN 325 MG PO TABS: 650 mg | ORAL | @ 01:00:00 | Stop: 2022-03-31 | NDC 00904677361

## 2022-03-29 MED ADMIN — ACETAMINOPHEN 325 MG PO TABS: 650 mg | ORAL | @ 12:00:00 | Stop: 2022-03-31 | NDC 00904677361

## 2022-03-29 MED ADMIN — QUETIAPINE FUMARATE 100 MG PO TABS: 100 mg | ORAL | @ 16:00:00 | Stop: 2022-03-31 | NDC 50268063211

## 2022-03-29 NOTE — Other
Patient's Clinical Goal:   Clinical Goal(s) for the Shift: VS, pain control, wean off O2  Identify possible barriers to advancing the care plan: none  Stability of the patient: Moderately Unstable - medium risk of patient condition declining or worsening    Progression of Patient's Clinical Goal:       Plan of Care   -achs bg  -cardiac and o2 monitoring  -fall precautions    Notable/significant events and interventions  -achs bg ordered, no insulin, MD Bridgett Larsson notified  -diet advanced to regular, ivf dc'd  -pt educated in IS usage, pt verbalized comprehension, IS at bedside and frequently encouraged  -pt walk in hall with assistance  -pt refuse meals, encouraged, refuse to eat, endorsed to oncoming rn to encourage    Additional Notes   Patient is oriented x 4  Patient c/o of abd pain. Pain Managed with meds  BMAT 3. Pt ambulates with assist.     Review of Discharge Planning  Plan/goals for discharge: ongoing  Barriers:Other     Handoff Checklist - Completed at every change of shift  Central Line  no  Foley Catheter  no  High-risk drugs independently verified yes  Sepsis screen reviewed and completed with 2nd RN?  yes  Pressure injury  no  RN/MD Rounding? yes     Evamaria Detore Lolita Cram

## 2022-03-29 NOTE — Progress Notes
General Surgery Progress Note [facility] [id number]   PATIENT: Donna Gross  MRN: [id number]  DOB: 01/30/1966  DATE OF SERVICE: 03/29/2022    HISTORY OF PRESENT ILLNESS   Donna Gross is a 56 y.o. female 2 Days Post-Op from the following procedure(s):  Procedure(s) (LRB):  LAPAROSCOPIC CHOLECYSTECTOMY, LYSIS OF ADHESIONS (N/A)    INTERVAL EVENTS   4/8: NAEON, AFVSS. Having significant pain so did not try any PO. Will add robaxin and encourage IS, ambulation.  4/9: drinking water, feeling nervous to eat. Some nausea, no vomiting. Ambulated well.     OBJECTIVE   VITALS  BP 146/86  ~ Pulse 77  ~ Temp 36.6 ?C (97.8 ?F) (Oral)  ~ Resp 17  ~ Ht 5' 4'' (1.626 m)  ~ Wt 141 lb 15.6 oz (64.4 kg)  ~ SpO2 94%  ~ BMI 24.37 kg/m?     INTAKE/OUTPUT  I/O last 3 completed shifts:  In: 2751.3 [P.O.:50; I.V.:2701.3]  Out: 1300 [Urine:1300]  Patient Lines/Drains/Airways Status     Active Drains:     None              PHYSICAL EXAM:  GENERAL: Comfortable, NAD.  HEENT: Face symmetric. Clear oropharinx.  CARDIOVASCULAR: Regular rate and rhythm.  PUMONARY: Normal respiratory excursions, not tachypneic, no labored breathing.  ABD: Non-distended, soft, appropriately tender. Incision c/d/i, dressing removed. Steri strips in place.   GU: Voiding  EXT: WWP, no c/c/e  NEURO: A&Ox3, moving all ext.    MEDICATIONS:  Current Facility-Administered Medications   Medication Dose Route Frequency   ? acetaminophen tab 650 mg  650 mg Oral Q6H   ? dextrose 5%/0.45% NaCl/20 mEq KCL IV soln  75 mL/hr Intravenous Continuous   ? enoxaparin 40 mg/0.4 mL inj 40 mg  40 mg Subcutaneous Q24H   ? FLUoxetine cap 40 mg  40 mg Oral Daily   ? HYDROmorphone 1 mg/mL inj 0.2 mg  0.2 mg IV Push Q2H PRN   ? ipratropium-albuterol 20-100 mcg/act inh 2 puff  2 puff Inhalation TID PRN   ? methocarbamol tab 750 mg  750 mg Oral TID   ? ondansetron 4 mg/2 mL inj 4 mg  4 mg Intravenous Q8H PRN   ? oxyCODONE tab 5 mg  5 mg Oral Q6H PRN    Or   ? oxyCODONE tab 10 mg  10 mg Oral Q6H PRN   ? pantoprazole DR tab 40 mg  40 mg Oral Daily   ? prochlorperazine 10 mg/2 mL inj 5 mg  5 mg IV Push Q4H PRN   ? QUEtiapine tab 100 mg  100 mg Oral BID   ? scopolamine 1.5 mg (delivers 1 mg over 3 days) patch 1 patch  1 patch Transdermal Q72H     LAB REVIEW                 IMAGING  No recent imaging reviewed.    ASSESSMENT/RECOMMENDATIONS   Briea Mcenery is a 56 y.o. female h/o multipl eprior operations with extensive adhesions s/p ulcer operation, prior laparoscopy who developed cholelithiasis and chronic cholecystitis 2 Days Post-Op ex laparoscopy, LOA, and lap cholecystectomy.     Plan:  - Advance to Regular diet, dc IV fluids  - IS, OOB, Ambulate  - Tylenol, Robaxin, oxycodone SS for pain, dc IV diluadid  - O2 parameters 88-92% (given COPD)    Patient was seen and examined with the Surgery Chief Resident, and the plan discussed with the Surgery Attending, Dr. Prentice Docker  C., MD, who agrees with the assessment/plan.    Author:   Elvin So, MD   S. ''Janae'' Sissy Hoff, MD  [facility], PGY-2  03/29/2022  7:22 AM  General Surgery [place] [phone]

## 2022-03-29 NOTE — Other
Patient's Clinical Goal:   Clinical Goal(s) for the Shift: VS, pain control, wean off O2  Identify possible barriers to advancing the care plan: none  Stability of the patient: Moderately Unstable - medium risk of patient condition declining or worsening    Progression of Patient's Clinical Goal: Lap sites unchanged, hypoactive bowel sounds. Able to wean to 0.5 L NC, intermittently desating overnight when sleeping. IS at bedside and pt encouraged to use when awake. IVF infusing.

## 2022-03-30 LAB — Glucose,POC
GLUCOSE,POC: 98 mg/dL (ref 65–99)
GLUCOSE,POC: 104 mg/dL — ABNORMAL HIGH (ref 65–99)
GLUCOSE,POC: 80 mg/dL (ref 65–99)

## 2022-03-30 MED ADMIN — PANTOPRAZOLE SODIUM 40 MG PO TBEC: 40 mg | ORAL | @ 15:00:00 | Stop: 2022-03-31 | NDC 68084081311

## 2022-03-30 MED ADMIN — ACETAMINOPHEN 325 MG PO TABS: 650 mg | ORAL | @ 20:00:00 | Stop: 2022-03-31 | NDC 00904677361

## 2022-03-30 MED ADMIN — OXYCODONE HCL 5 MG PO TABS: 10 mg | ORAL | @ 12:00:00 | Stop: 2022-03-31 | NDC 68084035411

## 2022-03-30 MED ADMIN — ENOXAPARIN SODIUM 40 MG/0.4ML IJ SOSY: 40 mg | SUBCUTANEOUS | @ 04:00:00 | Stop: 2022-03-31

## 2022-03-30 MED ADMIN — LIDOCAINE 5 % EX PTCH: 2 | TRANSDERMAL | @ 23:00:00 | Stop: 2022-03-31 | NDC 00591352511

## 2022-03-30 MED ADMIN — FLUOXETINE HCL 20 MG PO CAPS: 40 mg | ORAL | @ 15:00:00 | Stop: 2022-03-31 | NDC 00904578561

## 2022-03-30 MED ADMIN — OXYCODONE HCL 5 MG PO TABS: 10 mg | ORAL | @ 05:00:00 | Stop: 2022-03-31 | NDC 68084035411

## 2022-03-30 MED ADMIN — METHOCARBAMOL 750 MG PO TABS: 750 mg | ORAL | @ 04:00:00 | Stop: 2022-03-31 | NDC 60687056811

## 2022-03-30 MED ADMIN — MAGNESIUM HYDROXIDE 400 MG/5ML PO SUSP: 30 mL | ORAL | @ 15:00:00 | Stop: 2022-03-31 | NDC 00121043130

## 2022-03-30 MED ADMIN — QUETIAPINE FUMARATE 100 MG PO TABS: 100 mg | ORAL | @ 15:00:00 | Stop: 2022-03-31 | NDC 50268063211

## 2022-03-30 MED ADMIN — ACETAMINOPHEN 325 MG PO TABS: 650 mg | ORAL | @ 10:00:00 | Stop: 2022-03-31

## 2022-03-30 MED ADMIN — ACETAMINOPHEN 325 MG PO TABS: 650 mg | ORAL | @ 04:00:00 | Stop: 2022-03-31 | NDC 00904677361

## 2022-03-30 MED ADMIN — METHOCARBAMOL 750 MG PO TABS: 750 mg | ORAL | @ 12:00:00 | Stop: 2022-03-31 | NDC 60687056811

## 2022-03-30 MED ADMIN — ACETAMINOPHEN 325 MG PO TABS: 650 mg | ORAL | @ 12:00:00 | Stop: 2022-03-31 | NDC 00904677361

## 2022-03-30 MED ADMIN — QUETIAPINE FUMARATE 100 MG PO TABS: 100 mg | ORAL | @ 04:00:00 | Stop: 2022-03-31 | NDC 50268063211

## 2022-03-30 MED ADMIN — SCOPOLAMINE 1.5 MG (DELIVERS 1 MG OVER 3 DAYS) PATCH: 1 | TRANSDERMAL | @ 18:00:00 | Stop: 2022-03-30

## 2022-03-30 MED ADMIN — METHOCARBAMOL 750 MG PO TABS: 750 mg | ORAL | @ 20:00:00 | Stop: 2022-03-31 | NDC 60687056811

## 2022-03-30 MED ADMIN — DIPHENHYDRAMINE HCL 25 MG PO CAPS: 25 mg | ORAL | @ 23:00:00 | Stop: 2022-03-30 | NDC 00904723761

## 2022-03-30 NOTE — Progress Notes
General Surgery Progress Note [facility] [id number]   PATIENT: Donna Gross  MRN: [id number]  DOB: [birth year]  DATE OF SERVICE: 03/30/2022    HISTORY OF PRESENT ILLNESS   Quinita Kostelecky is a 56 y.o. female 3 Days Post-Op from the following procedure(s):  Procedure(s) (LRB):  LAPAROSCOPIC CHOLECYSTECTOMY, LYSIS OF ADHESIONS (N/A)    INTERVAL EVENTS   4/8: NAEON, AFVSS. Having significant pain so did not try any PO. Will add robaxin and encourage IS, ambulation.  4/9: drinking water, feeling nervous to eat. Some nausea, no vomiting. Ambulated well.   4/10: NAEON, AFVSS. Resting in bed, NAD. Per RN, refusing to eat solid foods as she is scared of pain. Denies nausea or vomiting, however. Encouraged to ambulate and try solid foods today .    OBJECTIVE   VITALS  BP 97/53  ~ Pulse 91  ~ Temp 37 ?C (98.6 ?F) (Oral)  ~ Resp 18  ~ Ht 5' 4'' (1.626 m)  ~ Wt 141 lb 15.6 oz (64.4 kg)  ~ SpO2 93%  ~ BMI 24.37 kg/m?     INTAKE/OUTPUT  I/O last 3 completed shifts:  In: 1720 [P.O.:150; I.V.:1570]  Out: -   Patient Lines/Drains/Airways Status     Active Drains:     None              PHYSICAL EXAM:  GENERAL: Comfortable, NAD.  HEENT: Face symmetric. Clear oropharinx.  CARDIOVASCULAR: Regular rate and rhythm.  PUMONARY: Normal respiratory excursions, not tachypneic, no labored breathing.  ABD: Non-distended, soft, appropriately tender. Incision c/d/i, dressing removed. Steri strips in place.   GU: Voiding  EXT: WWP, no c/c/e  NEURO: A&Ox3, moving all ext.    MEDICATIONS:  Current Facility-Administered Medications   Medication Dose Route Frequency   ? acetaminophen tab 650 mg  650 mg Oral Q6H   ? enoxaparin 40 mg/0.4 mL inj 40 mg  40 mg Subcutaneous Q24H   ? FLUoxetine cap 40 mg  40 mg Oral Daily   ? ipratropium-albuterol 20-100 mcg/act inh 2 puff  2 puff Inhalation TID PRN   ? magnesium hydroxide 400 mg/5 mL susp 30 mL  30 mL Oral Daily   ? methocarbamol tab 750 mg  750 mg Oral TID   ? ondansetron 4 mg/2 mL inj 4 mg  4 mg Intravenous Q8H PRN   ? oxyCODONE tab 5 mg  5 mg Oral Q6H PRN    Or   ? oxyCODONE tab 10 mg  10 mg Oral Q6H PRN   ? pantoprazole DR tab 40 mg  40 mg Oral Daily   ? prochlorperazine 10 mg/2 mL inj 5 mg  5 mg IV Push Q4H PRN   ? QUEtiapine tab 100 mg  100 mg Oral BID     LAB REVIEW                 IMAGING  No recent imaging reviewed.    ASSESSMENT/RECOMMENDATIONS   Mishael Krysiak is a 56 y.o. female h/o multipl eprior operations with extensive adhesions s/p ulcer operation, prior laparoscopy who developed cholelithiasis and chronic cholecystitis 3 Days Post-Op ex laparoscopy, LOA, and lap cholecystectomy.     Patient is recovering well post-operatively. She is AF and HDS.    Plan:  - Advance to Regular diet  - IS, OOB, Ambulate  - O2 parameters 88-92% (given COPD)  - Appropriate for discharge when tolerating regular diet and pain well-controlled on PO.    Patient was seen and examined with the Surgery  Chief Resident, and the plan discussed with the Surgery Attending, Dr. [name], Dineen Kid., MD, who agrees with the assessment/plan.    Author:   Ulyses Amor, MD   03/30/2022  11:02 AM  General Surgery Rudi Heap [phone]

## 2022-03-30 NOTE — Other
Patient's Clinical Goal:   Clinical Goal(s) for the Shift: stable vitals, safety, pai relief, good rest  Identify possible barriers to advancing the care plan: none  Stability of the patient: Moderately Stable - low risk of patient condition declining or worsening   Progression of Patient's Clinical Goal: Slept on and off through the night. Pain managed with po meds. However, patient did request IV pain meds but makde patient aware that MD discontinued the IV med. Only po pain med available. Vitals remain stable and no acute distress note.O2 sated above 90% in room air. Passing gas but no BM yet. Patient did not order dinner and refused to eat. Stated ''I don't have appetites. Don't want to eat.'' ''Will they send me home if I am not eating?'' Instruct patient that MD will make decision for her. BMAT 3-4. Ambulated with standby assist well. Gait steady. Voiding in bathroom. Safety maintained and all needs have addressed. Continue plan of care.

## 2022-03-30 NOTE — Consults
CASE MANAGER ASSESSMENT      Admit PIRJ:[id number]    Date of Initial CM Assessment: 03/30/2022    Problems: Principal Problem:    Cholelithiasis POA: Yes  Active Problems:    Gallstones POA: Yes       Past Medical History:   Diagnosis Date    Anxiety     Depression     Emphysema, unspecified (HCC/RAF)     Emphysema, unspecified (HCC/RAF)     Fibromyalgia     Gastritis     GERD (gastroesophageal reflux disease)     Pancreatitis     Past Surgical History:   Procedure Laterality Date    ABDOMINAL SURGERY      COLON SURGERY            Primary Care Physician:Butler, Josem Kaufmann, MD  [phone]      NEEDS ASSESSMENT:     Level of Function Prior to Admit: Self Care/Indep. W ADLs    Primary Living Situation: Lives w/Family         Pre-admission Living Situation: Home/Apartment     Primary Support Systems: Children, Parent          Does the patient have a Family/Support System member participating in Discharge Planning?: Yes            Bathroom on Main Floor: Yes  Stairs in Home: 0       Prior Treatments / Services: None          Who is your PCP?: Kelle Darting    Do you have your Primary Care Doctor's office number?: Yes         Do you need information/education regarding your medical condition?: No       Verbalized financial concerns?: No       Were you hospitalized in the last 30 days?: No                        DISCHARGE ASSESSMENT:     Projected Date of Discharge: 03/30/2022    Anticipated Complex D/C?: No    Projected Discharge to: Home    Discharge Address: [address]  [place] [address]    Projected Discharge Needs: None      Support Identified at Discharge: Child  Name of Discharge Support Person: Clemetine Marker Phone Number: [phone]     Who is available to transport you upon discharge?: Family Transportation                        Lowella Curb,  03/30/2022

## 2022-03-31 LAB — Glucose,POC
GLUCOSE,POC: 98 mg/dL (ref 65–99)
GLUCOSE,POC: 93 mg/dL (ref 65–99)

## 2022-03-31 MED ORDER — OXYCODONE HCL 5 MG PO TABS
5 mg | ORAL_TABLET | Freq: Four times a day (QID) | ORAL | 0 refills | 4.00000 days | Status: AC | PRN
Start: 2022-03-31 — End: 2022-03-31
  Filled 2022-04-01: qty 30, 4d supply, fill #0

## 2022-03-31 MED ORDER — ACETAMINOPHEN 325 MG PO TABS
650 mg | Freq: Four times a day (QID) | ORAL | PRN
Start: 2022-03-31 — End: ?

## 2022-03-31 MED ORDER — OXYCODONE HCL 5 MG PO TABS
ORAL_TABLET | 0 refills | 4.00000 days | Status: AC
Start: 2022-03-31 — End: ?

## 2022-03-31 MED ORDER — POLYETHYLENE GLYCOL 3350 17 GM/SCOOP PO POWD
17 g | Freq: Every day | ORAL | 0 refills | 30.00 days | Status: AC
Start: 2022-03-31 — End: ?
  Filled 2022-04-01: qty 510, 30d supply, fill #0

## 2022-03-31 MED ORDER — DOCUSATE SODIUM 100 MG PO CAPS
100 mg | ORAL_CAPSULE | Freq: Two times a day (BID) | ORAL | 2 refills | 15.00000 days | Status: AC
Start: 2022-03-31 — End: ?
  Filled 2022-04-01: qty 30, 15d supply, fill #0

## 2022-03-31 MED ORDER — METHOCARBAMOL 750 MG PO TABS
ORAL_TABLET | ORAL | 0 refills | 15.00000 days | Status: AC
Start: 2022-03-31 — End: ?
  Filled 2022-04-01: qty 30, 15d supply, fill #0

## 2022-03-31 MED ADMIN — OXYCODONE HCL 5 MG PO TABS: 10 mg | ORAL | @ 11:00:00 | Stop: 2022-03-31 | NDC 68084035411

## 2022-03-31 MED ADMIN — OXYCODONE HCL 5 MG PO TABS: 10 mg | ORAL | @ 16:00:00 | Stop: 2022-03-31 | NDC 68084035411

## 2022-03-31 MED ADMIN — ACETAMINOPHEN 325 MG PO TABS: 650 mg | ORAL | @ 07:00:00 | Stop: 2022-04-04 | NDC 00904677361

## 2022-03-31 MED ADMIN — ACETAMINOPHEN 325 MG PO TABS: 650 mg | ORAL | @ 01:00:00 | Stop: 2022-03-31 | NDC 71399801401

## 2022-03-31 MED ADMIN — PANTOPRAZOLE SODIUM 40 MG PO TBEC: 40 mg | ORAL | @ 16:00:00 | Stop: 2022-03-31 | NDC 68084081311

## 2022-03-31 MED ADMIN — METHOCARBAMOL 750 MG PO TABS: 750 mg | ORAL | @ 03:00:00 | Stop: 2022-03-31 | NDC 60687056811

## 2022-03-31 MED ADMIN — LIDOCAINE 5 % EX PTCH: 2 | TRANSDERMAL | @ 11:00:00 | Stop: 2022-03-31

## 2022-03-31 MED ADMIN — ENOXAPARIN SODIUM 40 MG/0.4ML IJ SOSY: 40 mg | SUBCUTANEOUS | @ 01:00:00 | Stop: 2022-03-31 | NDC 71288041082

## 2022-03-31 MED ADMIN — QUETIAPINE FUMARATE 100 MG PO TABS: 100 mg | ORAL | @ 03:00:00 | Stop: 2022-03-31 | NDC 50268063211

## 2022-03-31 MED ADMIN — FLUOXETINE HCL 20 MG PO CAPS: 40 mg | ORAL | @ 16:00:00 | Stop: 2022-03-31 | NDC 00904578561

## 2022-03-31 MED ADMIN — DIPHENHYDRAMINE HCL 25 MG PO CAPS: 25 mg | ORAL | @ 16:00:00 | Stop: 2022-03-31 | NDC 00904723761

## 2022-03-31 MED ADMIN — QUETIAPINE FUMARATE 100 MG PO TABS: 100 mg | ORAL | @ 16:00:00 | Stop: 2022-03-31 | NDC 50268063211

## 2022-03-31 MED ADMIN — METHOCARBAMOL 750 MG PO TABS: 750 mg | ORAL | @ 13:00:00 | Stop: 2022-03-31 | NDC 60687056811

## 2022-03-31 MED ADMIN — ACETAMINOPHEN 325 MG PO TABS: 650 mg | ORAL | @ 11:00:00 | Stop: 2022-03-31 | NDC 00904677361

## 2022-03-31 MED ADMIN — OXYCODONE HCL 5 MG PO TABS: 10 mg | ORAL | @ 03:00:00 | Stop: 2022-03-31 | NDC 68084035411

## 2022-03-31 MED ADMIN — MAGNESIUM HYDROXIDE 400 MG/5ML PO SUSP: 30 mL | ORAL | @ 16:00:00 | Stop: 2022-03-31 | NDC 00121043130

## 2022-03-31 NOTE — Other
Patient's Clinical Goal: comfort  Clinical Goal(s) for the Shift: VSS, pain management  Identify possible barriers to advancing the care plan: none  Stability of the patient: Moderately Stable - low risk of patient condition declining or worsening   Progression of Patient's Clinical Goal:     Plan of Care   VSS  Pain management  Fall precautions     Notable/significant events and interventions  Patient complained of generalized itching. Gave one time benadryl.    Additional Notes   Patient is oriented x 4  Patient c/o of back pain. Pain Managed with scheduled tyenol and lidocaine patches  Resp: 2L NC  GI: poor appetite  GU: voiding  BMAT 4. Pt ambulates with weak gait.     Review of Discharge Planning  Plan/goals for discharge: pending   Barriers:None     Handoff Checklist - Completed at every change of shift  Central Line  no  Foley Catheter  no  High-risk drugs independently verified no  Sepsis screen reviewed and completed  yes  Pressure injury  no  RN/MD Rounding? yes

## 2022-03-31 NOTE — Consults
Spoke to pt's daughter Clemetine Marker [phone] who stated that she can provide transportation upon d/c today. Pt's daughter stated she could present around 11a. Requested for RN to call her if she is running late and ETA is needed.     RN and CM updated.    SW remains available for any other needs as they arise prior to d/c.      Vela Prose Bradly Bienenstock) Nyra Market, LCSW  Contract Clinical Social Worker, 2[facility]MS & EOF   Department of Care Coordination & Clinical Social Work   Hours: M-F 8-4:30p  Phone: [phone]  Pager: [phone]

## 2022-03-31 NOTE — Other
Patient's Clinical Goal:   Clinical Goal(s) for the Shift: vss, pain management, free from falls an dinjury  Identify possible barriers to advancing the care plan: pain   Stability of the patient: Moderately Stable - low risk of patient condition declining or worsening   Progression of Patient's Clinical Goal: discharged via wheelchair with family and staff. Medications from pharmacy given. States tolerable pain at incision site. IV removed with tip intact. Dressing applied. Verbalized understanding of discharge and follow up

## 2022-03-31 NOTE — Other
Patient's Clinical Goal:   Clinical Goal(s) for the Shift: vss, pain management, free from falls an dinjury  Identify possible barriers to advancing the care plan: pain  Stability of the patient: Moderately Stable - low risk of patient condition declining or worsening   Progression of Patient's Clinical Goal: patient discharged with family.

## 2022-03-31 NOTE — Discharge Instructions
GENERAL SURGERY DISCHARGE INSTRUCTIONS:  Please, review the following discharge instructions. They will answer many common questions patients have after surgery.    HOW TO REACH [place] FOR ALL OTHER QUESTIONS BESIDES SURGERY APPOINTMENT:  - We are available to you at all times.   - During business hours call our General Surgery Clinic [phone], nurses, residents, and nurse practitioners are available to answer your questions.   - After hours, weekends, and holidays call [facility] Page Operator at [phone] and request the operator to page [phone] [facility] Surgery Team.)  - In case of an emergency, report to your closest Emergency Room or call 911.     DANGER SIGNALS TO WATCH FOR AT HOME  Contact your surgery team immediately if you experience any of the following.   Fever of 101.5 or 38.5 (or higher) or chills  Nausea, vomiting  Pain that is poorly controlled  Increasing shortness of breath  Unrelieved constipation  Chest pain  Calf/leg pain  Increased redness, drainage, bleeding, swelling, or change in color of drainage from your wound site(s) or tube(s)    WHEN CAN YOU RESUME YOUR USUAL ACTIVITIES:  Resume regular activity gradually as tolerated. Avoid running, jogging, swimming, exercising, or contact sports unless cleared by your surgery team.    WALKING: You may walk as much as you can. Walking will improve circulation, increase your feeling of well-being, and prevent pulmonary problems.    LIFTING:  The surgery team may recommend NO HEAVY LIFTING. Do not lift more than 5-10lbs for 4-6 weeks or until your surgeon tells you to do so.     DRIVING: Do not drive while you are on prescription pain medications.      BATHING/INCISIONAL CARE:  Keep the surgical incision clean and dry. You may shower 48 hrs after your surgery. If you go home the next day after surgery, you still have the original operating room dressing in place. Please, remove the top dressing 2 days after surgery. Underneath the top dressing you will likely have Steristrips covering your incisions. Leave the Steri-strips in place. You may shower without covering your incisions.  The steri-strips will fall off on their own in 7-10 days. No bathtubs, swimming, or immersion in water for 3-4 weeks or until cleared by your surgeon.     If you are discharged with an abdominal binder after a hernia repair - recommend wearing binder at all times except showing the first month.  The second month, may remove binder for showering and sleeping.    MEDICATIONS:   You may start taking the medications you took prior to surgery unless directed otherwise by your physician. Should you have questions about this please ask your physician prior to discharge.  You may be taking pain medication whose main side effects are constipation and drowsiness. It is essential for you to maintain an adequate fruit and fluid intake to avoid constipation.  It is important to take the stool softeners and/or laxatives that have been prescribed for while you are taking narcotics for pain relief in order to avoid constipation.  If you should become constipated there are many agents that you can obtain from any pharmacy over the counter. These include:         For mild constipation: Colace, Senna, Milk of Magnesium, Miralax, Dulcolax tablet.         For moderate constipation: Milk of Magnesium, Dulcolax suppository         For severe constipation: Please speak to your primary care physician.  You may also experience nightmares, hallucinations and sweating from your pain medications. Please let you doctor know if these symptoms occur.  Some medications such as Percocet, Vicodin and Norco contain Tylenol. Avoid taking these medications more frequently then directed; too much Tylenol can cause liver damage. Do NOT take over-the-counter products that contain Tylenol/Acetaminophen if you are already on these medications.  DO NOT drive while taking narcotics!    If you are unsure if a medication is safe, ask your physician and/or pharmacist.    DIET: Regular diet    WORK:  Most patients return to work/school within 1-2 weeks.       AFTER SURGERY FOLLOW UP   - Follow up with your surgeon as directed (usually within 1-2 weeks.) To set up an appointment please call the appropriate number below:    The following appointments are schedule:  Future Appointments   Date Time Provider Department Center   04/10/2022 11:30 AM Alonna Buckler., MD SUR GEN [address]        For Dr. [name]: please call [phone].    Follow-up with your primary care physician within 2-3 weeks.     IF YOUR APPOINTMENT IS VIA TELE HEALTH: How to join a video visit  You can conduct Video Visits with your provider on your mobile device. You will need to be signed up for [facility] and will need to download the MyChart/Epic mobile application on your mobile device.    Video Visit Patient Workflow    1. Schedule a Video Visit with [facility] scheduling.    2. Sign up for [email or web address]. Create a Database administrator.    3. Download the MyChart/Epic mobile application on your mobile device. (Available on iOS and Android devices)    4. You will receive an appointment reminder in your [facility]MyChart account. You will receive a reminder text message, if you have opted in to receive text messages.    5. Log into the MyChart app on your mobile device and click on the Appointments  Section.      6. Select your scheduled telemedicine appointment and complete the eCheck-in.        7. Complete the eCheck-in consent.      8. Conduct the visit with your provider.    If you experience any technical problems, call the MyChart Technical Support line at [phone].

## 2022-03-31 NOTE — Other
Patient's Clinical Goal:   Clinical Goal(s) for the Shift: vss, pain management, free from falls an dinjury  Identify possible barriers to advancing the care plan: none  Stability of the patient: Moderately Unstable - medium risk of patient condition declining or worsening    Progression of Patient's Clinical Goal:     VSS. Pt low grade temp of 99.7 and HR in the 110s around 0000. [facility] pager notified, no new orders. Free from falls and injury. Pain managed with meds. No new complaints overnight.

## 2022-03-31 NOTE — Progress Notes
Pharmaceutical Services - Meds to [facility] Discharge Medication Reconciliation and Counseling Note    Patient Name: Donna Gross  Medical Record Number: [id number]  Admit date: 03/28/2022 10:51 AM    Age: 57 y.o.  Sex: female  Allergies: Hydrocodone, Ibuprofen, and Morphine    Preferred Pharmacy:   [id number] [place], [place] - [name] AT corner of [address]  [address] [place] [address]         I reconciled the discharge medications and counseled the patient on all new prescriptions. Discharge prescriptions were delivered to bedside. The purpose, potential side effects, storage, missed doses and any special instructions related to each medication was discussed. Medications continued from home were also reviewed with the patient.    Discharge Medication List from AVS:     Changes To My Medications        START taking these medications        Dose Instructions Notes   acetaminophen 325 mg tablet  Commonly known as: Tylenol   650 mg   Take 2 tablets (650 mg total) by mouth every six (6) hours as needed for Pain.      docusate 100 mg capsule  Commonly known as: docusate   100 mg   Take 1 capsule (100 mg total) by mouth two (2) times daily While on narcotic pain medication.      methocarbamol 750 mg tablet  Commonly known as: Robaxin    Take 1 to 2 tablets by mouth every 6 to 8 hours as needed for pain muscle pain/spasms.      polyethylene glycol powder  Commonly known as: Glycolax   17 g   Mix and drink 17 grams in liquid by mouth daily while on narcotic pain medication.             CHANGE how you take these medications        Dose Instructions Notes   oxyCODONE 5 mg tablet  What changed:   how much to take  how to take this  when to take this  reasons to take this  additional instructions    Take 1 to 2 tablets by mouth every 6 to 8 hours as needed for pain..             CONTINUE taking these medications        Dose Instructions Notes   clobetasol 0.05% cream  Commonly known as: Temovate Apply topically three (3) times daily as needed (rash).      FLUoxetine 40 mg capsule  Commonly known as: PROzac   40 mg   Take 1 capsule (40 mg total) by mouth daily.      fluticasone-salmeterol 250-50 mcg/dose diskus  Commonly known as: Advair   1 puff   Inhale 1 puff two (2) times daily as needed (SOB).      ipratropium-albuterol 20-100 mcg/act inhaler  Commonly known as: Respimat   2 puff   Inhale 2 puffs three (3) times daily as needed (SOB).      metoclopramide 10 mg tablet  Commonly known as: Reglan   10 mg   Take 1 tablet (10 mg total) by mouth three (3) times daily before meals.      multivitamin tablet   1 tablet   Take 1 tablet by mouth daily.      naloxone 4 mg/0.1 mL nasal spray  Commonly known as: Narcan    Call 911. Administer a single spray  intranasally into one nostril for opioid overdose. May repeat in 3 minutes if patient is not breathing..      ondansetron ODT 4 mg disintegrating tablet  Commonly known as: Zofran ODT   4 mg   Take 1 tablet (4 mg total) by mouth every six (6) hours as needed for Nausea or Vomiting.      oxyCODONE-acetaminophen 5-325 mg tablet  Commonly known as: Percocet   2 tablet   Take 2 tablets by mouth every four (4) hours as needed. Max Daily Amount: 12 tablets      pantoprazole 40 mg DR tablet  Commonly known as: Protonix   40 mg   Take 1 tablet (40 mg total) by mouth daily.      QUEtiapine 100 mg tablet  Commonly known as: SEROquel   100 mg   Take 1 tablet (100 mg total) by mouth two (2) times daily.      sucralfate 1 g/10 mL suspension  Commonly known as: Carafate   1 g   Take 10 mLs (1 g total) by mouth three (3) times daily with meals and at bedtime.      traZODone 100 mg tablet   100 mg   Take 1 tablet (100 mg total) by mouth at bedtime.             STOP taking these medications        STOP taking these medications   aluminum-magnesium hydroxide-simethicone 400-400-40 mg/5 mL suspension                Prescriptions        These medications were sent to [facility] ([facility]) [id number])  [address], [place] [address]      Hours: Mon-Fri 8AM-6PM, Saturdays & Holidays 8AM-5PM (Closed 1PM-2PM for lunch); Closed Sundays Phone: [phone]   docusate 100 mg capsule  methocarbamol 750 mg tablet  oxyCODONE 5 mg tablet  polyethylene glycol powder       Information about where to get these medications is not yet available    Ask your nurse or doctor about these medications  acetaminophen 325 mg tablet         [facility], PharmD, 03/31/2022, 1:03 PM

## 2022-04-01 ENCOUNTER — Telehealth: Payer: PRIVATE HEALTH INSURANCE

## 2022-04-01 LAB — Tissue Exam

## 2022-04-01 NOTE — Discharge Summary
DISCHARGE SUMMARY/ GENERAL SURGERY  PATIENT:  Donna Gross MRN:  [id number]  DOB:  [birth year]    Attending Physician: Dr. [name]  Primary Care Physician:  Kelle Darting, MD    Admission Date:  03/28/2022  Discharge Date:  03/31/2022 11:47 AM     Admission Diagnosis:    1.         Chronic cholecystitis.  2.         Status post multiple abdominal operations including prior partial gastrectomy for perforated ulcer.  3.         Status post prior extensive adhesiolysis and kocherization of the duodenum.    Discharge Diagnosis:   1.         Chronic cholecystitis.  2.         Status post multiple abdominal operations including prior partial gastrectomy for perforated ulcer.  3.         Status post prior extensive adhesiolysis and kocherization of the duodenum.   4.         Extensive adhesions of the small bowel and colon to the anterior abdominal wall.  5.         Extensive adhesions of the liver, duodenum, omentum and colon to the gallbladder.    Surgery Performed:   1.         Exploratory laparoscopy.  2.         Extensive adhesiolysis of the small bowel, omentum, liver, colon, gallbladder, and stomach.   3.         Laparoscopic cholecystectomy.    Complications:   None    Consultations:    None    Chronic Conditions:  Past Medical History:   Diagnosis Date   ? Anxiety    ? Depression    ? Emphysema, unspecified (HCC/RAF)    ? Emphysema, unspecified (HCC/RAF)    ? Fibromyalgia    ? Gastritis    ? GERD (gastroesophageal reflux disease)    ? Pancreatitis        HPI: Donna Gross is a 56 y.o. female with hx COPD, PUD c/b gastric outlet obstruction s/p multiples surgeries including vagotomy,  hernia repair with Dr [name], colon perf s/p colostomy bag s/p reversal, gallstone pancreatitis,  pending cholecystectomy in May, presents to ED complaining of abdominal pain and PO intolerance, found to be Covid positive.     She states she was on her usual state of health until 8 days ago when she started with severe RUQ pain associated with NBNNB emesis . She states she has not eaten for the last 8 days, however she had two Pedialyte popsicles yesterday, and tolerated it well. She also has been  having diarrhea x8 days, non bloody, however she has noted mucus in stool.  For pain at home she has been taking percocet with some improvement.  ?  Additionally, Hx COPD at home oxygen  1-2 L NC,   she has noted new onset dyspnea with walking at home  for the past 5 days, denies CP, orthopnea, PND, LE sweling. She states she has not been Covid vaccinated, no sick contacts exposure.  ?  Has had gallbladder problems for one year, had stone removal per GI 03/2021  ?    Brief Hospital Course:     On  03/28/2022 the patient was taken to the operating room at [facility] and underwent above mentioned general surgery. Please refer to operation report for details. The patient tolerated the operation well, without complications,  and was admitted for standard postoperative management and monitoring.  The patient was transferred to med/surg unit in stable condition.  She was started on DVT prophylaxis, a bowel regimen, a clear liquid diet, and appropriate home medications were restarted.  On post-op day 1 the patient was advanced to a regular diet.  The patient had uncontrolled pain and poor appetite an remained for additional 2 days.   By postoperative day 3, the patient was breathing easily on room air, had return of bowel function, was tolerating a diet, and had adequate pain control with oral medication. The patient was deemed stable for discharge.    Physical Exam:  BP 100/70  ~ Pulse 82  ~ Temp 36.7 ?C (98 ?F) (Oral)  ~ Resp 18  ~ Ht 1.626 m (5' 4'')  ~ Wt 64.4 kg (141 lb 15.6 oz)  ~ SpO2 93%  ~ BMI 24.37 kg/m?   General: No acute distress.   Neuro: Alert & oriented x 4.   HEENT: Oropharynx moist without exudates.  Cardiovascular: Regular rate and rhythm.     Pulm: Normal respiratory excursions, not tachypneic, no labored breathing.   GI: Abdomen soft, appropriately-tender, non-distended, incisions w/ steri strips c/d/i  GU:  Voiding without difficulty.   Extremities: Moving all four extremities, no peripheral edema noted.   Surgical Drains:  None    Discharge Labs:         Lab Results   Component Value Date    ALT 15 02/15/2022    AST 25 02/15/2022    ALKPHOS 69 02/15/2022    BILITOT 0.3 02/15/2022       Imaging/Studies:  No imaging has been resulted in the last 30 days    Discharge Medications:   Discharge Medication List as of 03/31/2022 10:11 AM      START taking these medications    Details   acetaminophen 325 mg tablet Take 2 tablets (650 mg total) by mouth every six (6) hours as needed for Pain., Starting Tue 03/31/2022, No Print      docusate 100 mg capsule Take 1 capsule (100 mg total) by mouth two (2) times daily While on narcotic pain medication., Starting Tue 03/31/2022, Until Wed 03/31/2023, Normal      methocarbamol 750 mg tablet Take 1 to 2 tablets by mouth every 6 to 8 hours as needed for pain muscle pain/spasms., Normal      polyethylene glycol powder Mix and drink 17 grams in liquid by mouth daily while on narcotic pain medication., Starting Tue 03/31/2022, Normal         CONTINUE these medications which have CHANGED    Details   oxyCODONE 5 mg tablet Take 1 to 2 tablets by mouth every 6 to 8 hours as needed for pain.., Normal         CONTINUE these medications which have NOT CHANGED    Details   FLUoxetine 40 mg capsule Take 1 capsule (40 mg total) by mouth daily., Historical Med      oxyCODONE-acetaminophen 5-325 mg tablet Take 2 tablets by mouth every four (4) hours as needed. Max Daily Amount: 12 tablets, Starting Fri 12/19/2021, Normal      pantoprazole 40 mg DR tablet Take 1 tablet (40 mg total) by mouth daily., Historical Med      quetiapine 100 mg tablet Take 1 tablet (100 mg total) by mouth two (2) times daily., Starting 12/27/2015, Until Discontinued, No Print      traZODone 100 mg tablet Take 1  tablet (100 mg total) by mouth at bedtime., Historical Med      clobetasol 0.05% cream Apply topically three (3) times daily as needed (rash)., Historical Med      fluticasone-salmeterol 250-50 mcg/dose diskus Inhale 1 puff two (2) times daily as needed (SOB)., Historical Med      ipratropium-albuterol 20-100 mcg/act inhaler Inhale 2 puffs three (3) times daily as needed (SOB)., Historical Med      metoclopramide 10 mg tablet Take 1 tablet (10 mg total) by mouth three (3) times daily before meals., Starting Mon 01/29/2020, Normal      multivitamin tablet Take 1 tablet by mouth daily., Starting Mon 01/29/2020, Normal      Naloxone HCl 4 MG/0.1ML LIQD Call 911. Administer a single spray intranasally into one nostril for opioid overdose. May repeat in 3 minutes if patient is not breathing.., Normal      ondansetron ODT 4 mg disintegrating tablet Take 1 tablet (4 mg total) by mouth every six (6) hours as needed for Nausea or Vomiting., Starting Mon 02/10/2021, Normal      sucralfate 1 g/10 mL suspension Take 10 mLs (1 g total) by mouth three (3) times daily with meals and at bedtime., Starting Tue 03/18/2021, Normal         STOP taking these medications       aluminum-magnesium hydroxide-simethicone 400-400-40 mg/5 mL suspension Comments:   Reason for Stopping:                Disposition: Home.      Discharge Condition: Stable    Diet: Regular      Activity/ Restrictions: Ambulate as tolerated.  No heavy lifting over 5-10 pounds for 4-6 weeks, and may increase activity as tolerated.  May shower once dressings are removed but do not submerge your incisions in a bath or pool.    Return Precautions: As listed on After Visit Summary (AVS), but also including:      - Onset of severe, persistent pain not relieved by medication and rest.      - Difficulty obtaining medications      - Any new onset of or increased weakness, numbness or tingling      - Persistent chills; new onset of fever > 101 degrees F, or night sweats      - Any redness, swelling, drainage, heat, or pain around your incision      - Any new onset of chest pain or shortness of breath      - If applicable, any clogging or dislodgement of your surgical drain.    Post-Discharge Referrals: None     Post-Discharge Appointments:   The patient was asked to follow up with surgeon for a postoperative visit at [facility] clinic within 1-2 weeks.  Please, call [phone] to set up a follow up appointment.      Additional appointments should be made with:   Follow up with your primary care doctor in 2-3 weeks.    The patient was seen and examined by the General Surgery team.  Above plan was discussed with chief resident and attending physician, Dr. [name], who were in agreement with the plan.    Author: Katrinka Blazing. Cesar Alf, NP  03/31/2022 9:01 PM  Division of General Surgery

## 2022-04-01 NOTE — Telephone Encounter
Post Op Follow up Phone Call       Patient is s/p Laparoscopic Cholecystectomy, Lysis Of Adhesions on 03/27/2022.     Left message to call if the patient has any questions or concerns - at [phone]     Future Appointments   Date Time Provider [place]   04/10/2022 11:30 AM Mardene Sayer., MD SUR GEN [place], [place]  General Surgery

## 2022-04-10 ENCOUNTER — Ambulatory Visit: Payer: PRIVATE HEALTH INSURANCE

## 2022-04-20 ENCOUNTER — Inpatient Hospital Stay: Payer: PRIVATE HEALTH INSURANCE

## 2022-04-21 ENCOUNTER — Telehealth: Payer: PRIVATE HEALTH INSURANCE

## 2022-04-22 ENCOUNTER — Inpatient Hospital Stay
Admit: 2022-04-22 | Discharge: 2022-04-28 | Disposition: A | Discharge status: Home / Self Care | Payer: PRIVATE HEALTH INSURANCE | Source: Home / Self Care

## 2022-04-22 ENCOUNTER — Ambulatory Visit: Payer: PRIVATE HEALTH INSURANCE

## 2022-04-22 DIAGNOSIS — R109 Unspecified abdominal pain: Secondary | ICD-10-CM

## 2022-04-22 DIAGNOSIS — K839 Disease of biliary tract, unspecified: Secondary | ICD-10-CM

## 2022-04-22 LAB — Prealbumin: PREALBUMIN: 24.6 mg/dL (ref 14.0–40.0)

## 2022-04-22 LAB — Lipase: LIPASE: 102 U/L — ABNORMAL HIGH (ref 13–69)

## 2022-04-22 LAB — UA,Microscopic: SQUAMOUS EPITHELIAL CELLS: 12 {cells}/uL (ref 0–17)

## 2022-04-22 LAB — Comprehensive Metabolic Panel
ALKALINE PHOSPHATASE: 105 U/L (ref 37–113)
ESTIMATED GFR 2021 CKD-EPI: 81 mL/min/{1.73_m2} (ref 3.6–5.3)

## 2022-04-22 LAB — Expedited COVID-19 and Influenza A B PCR

## 2022-04-22 LAB — Albumin: ALBUMIN: 3.7 g/dL — ABNORMAL LOW (ref 3.9–5.0)

## 2022-04-22 LAB — Extra Lavender Top

## 2022-04-22 LAB — Pregnancy Test,Blood: PREGNANCY TEST,BLOOD: NEGATIVE

## 2022-04-22 LAB — UA,Dipstick

## 2022-04-22 LAB — C-Reactive Protein: C-REACTIVE PROTEIN: 0.5 mg/dL (ref <0.8)

## 2022-04-22 LAB — CBC: MEAN CORPUSCULAR HEMOGLOBIN: 27.4 pg (ref 26.4–33.4)

## 2022-04-22 LAB — Extra Light Green Top

## 2022-04-22 MED ADMIN — IOHEXOL 350 MG/ML IV SOLN: 100 mL | INTRAVENOUS | @ 13:00:00 | Stop: 2022-04-22 | NDC 00407141491

## 2022-04-22 MED ADMIN — OXYCODONE HCL 5 MG PO TABS: 5 mg | ORAL | @ 18:00:00 | Stop: 2022-04-24 | NDC 00406055262

## 2022-04-22 MED ADMIN — QUETIAPINE FUMARATE 100 MG PO TABS: 100 mg | ORAL | @ 17:00:00 | Stop: 2022-04-28

## 2022-04-22 MED ADMIN — PROPOFOL 200 MG/20ML IV EMUL: INTRAVENOUS | @ 20:00:00 | Stop: 2022-04-22 | NDC 63323026929

## 2022-04-22 MED ADMIN — PROCHLORPERAZINE EDISYLATE 10 MG/2ML IJ SOLN: 5 mg | INTRAVENOUS | @ 23:00:00 | Stop: 2022-04-22 | NDC 70860077841

## 2022-04-22 MED ADMIN — ONDANSETRON HCL 4 MG/2ML IJ SOLN: 4 mg | INTRAVENOUS | @ 12:00:00 | Stop: 2022-04-22 | NDC 60505613000

## 2022-04-22 MED ADMIN — METHOCARBAMOL 750 MG PO TABS: 750 mg | ORAL | @ 13:00:00 | Stop: 2022-04-24

## 2022-04-22 MED ADMIN — SODIUM CHLORIDE 0.9 % IV SOLN: 1000 mL | @ 12:00:00 | Stop: 2022-04-22 | NDC 00338004904

## 2022-04-22 MED ADMIN — DEXTROSE 5 %/LACTATED RINGERS: 50 mL/h | INTRAVENOUS | @ 22:00:00 | Stop: 2022-04-28 | NDC 00338012504

## 2022-04-22 MED ADMIN — SODIUM CHLORIDE 0.9 % IV BOLUS: 1000 mL | INTRAVENOUS | @ 12:00:00 | Stop: 2022-04-22

## 2022-04-22 MED ADMIN — PHENYLEPHRINE HCL 10 MG/ML IV SOLN: INTRAVENOUS | @ 20:00:00 | Stop: 2022-04-22 | NDC 00641618810

## 2022-04-22 MED ADMIN — ONDANSETRON HCL 4 MG/2ML IJ SOLN: INTRAVENOUS | @ 21:00:00 | Stop: 2022-04-22 | NDC 60505613005

## 2022-04-22 MED ADMIN — HYDROMORPHONE HCL 1 MG/ML IJ SOLN: .2 mg | INTRAVENOUS | @ 14:00:00 | Stop: 2022-04-26 | NDC 00409128331

## 2022-04-22 MED ADMIN — PIPERACILLIN-TAZOBACTAM 3.375 G/50 ML RTU: 3.375 g | INTRAVENOUS | @ 13:00:00 | Stop: 2022-04-22 | NDC 00206886101

## 2022-04-22 MED ADMIN — ACETAMINOPHEN 325 MG PO TABS: 650 mg | ORAL | @ 19:00:00 | Stop: 2022-04-24

## 2022-04-22 MED ADMIN — FENTANYL CITRATE (PF) 100 MCG/2ML IJ SOLN: 25 ug | INTRAVENOUS | @ 23:00:00 | Stop: 2022-04-22 | NDC 00409909412

## 2022-04-22 MED ADMIN — DEXTROSE 5 %/LACTATED RINGERS: 50 mL/h | INTRAVENOUS | @ 17:00:00 | Stop: 2022-05-22 | NDC 00338012504

## 2022-04-22 MED ADMIN — PLASMA-LYTE A IV SOLN: 50 mL/h | INTRAVENOUS | @ 20:00:00 | Stop: 2022-04-23 | NDC 00338022104

## 2022-04-22 MED ADMIN — HYDROMORPHONE HCL 1 MG/ML IJ SOLN: 1 mg | INTRAVENOUS | @ 12:00:00 | Stop: 2022-04-22 | NDC 00409128331

## 2022-04-22 MED ADMIN — ACETAMINOPHEN 325 MG PO TABS: 650 mg | ORAL | @ 13:00:00 | Stop: 2022-04-24

## 2022-04-22 MED ADMIN — FENTANYL CITRATE (PF) 100 MCG/2ML IJ SOLN: INTRAVENOUS | @ 20:00:00 | Stop: 2022-04-22 | NDC 00409909422

## 2022-04-22 MED ADMIN — METHOCARBAMOL 750 MG PO TABS: 750 mg | ORAL | @ 19:00:00 | Stop: 2022-04-24

## 2022-04-22 MED ADMIN — DEXAMETHASONE SODIUM PHOSPHATE 4 MG/ML IJ SOLN: INTRAVENOUS | @ 20:00:00 | Stop: 2022-04-22 | NDC 67457042312

## 2022-04-22 MED ADMIN — LIDOCAINE HCL (CARDIAC) 100 MG/5ML IV SOSY: INTRAVENOUS | @ 20:00:00 | Stop: 2022-04-22 | NDC 76329339001

## 2022-04-22 MED ADMIN — OXYCODONE HCL 5 MG/5ML PO SOLN: 5 mg | ORAL | @ 23:00:00 | Stop: 2022-04-22 | NDC 00121482705

## 2022-04-22 MED ADMIN — SODIUM CHLORIDE 0.9 % IV BOLUS: 1000 mL | INTRAVENOUS | @ 12:00:00 | Stop: 2022-04-22 | NDC 00338004904

## 2022-04-22 NOTE — Telephone Encounter
PATIENT:  Donna Gross  MRN:  [id number]  DOB:  07-Jan-1966  DATE OF CALL:  04/21/2022   TIME OF CALL:    7:11 PM     I received a page from the operator to call patient Donna Gross.     I spoke with Donna Gross who said she was discharged earlier from an OSH today. She says she is going to make her way to the ED at [facility] to get fully evaluated.    Lincoln Maxin, MD PhD PGY-2  General Surgery

## 2022-04-22 NOTE — ED Notes
Hand off report given to Angelica RN

## 2022-04-22 NOTE — ED Notes
Introduced self to pt.  Pt came in for abdominal pain.  Armband checked and pt privacy maintained.  CI-CARE performed.  Pt shows no signs of distress.  Breathing is regular, even and unlabored.  Pain meds given prior to start of shift.  Explained ED course and delays with pt.  Fall precaution in place and call light placed within reach.  Will continue to monitor pt.     Pt will be transferred to admission bed when available.

## 2022-04-22 NOTE — Other
Patient's Clinical Goal:   Clinical Goal(s) for the Shift: VSS, pain control, IVF, NPO x meds, ERCP, comfort  Identify possible barriers to advancing the care plan: none  Stability of the patient: Moderately Stable - low risk of patient condition declining or worsening   Progression of Patient's Clinical Goal: A&Ox4, VSS on RA. NPO since midnight plan for ERCP. Gave report to the RN taking over in . Patient will be transferred to surgery first then to her room in [id number]. Belongings are with patient.

## 2022-04-22 NOTE — Discharge Instructions
GENERAL SURGERY DISCHARGE INSTRUCTIONS:  Please, review the following discharge instructions. They will answer many common questions patients have after surgery.    HOW TO REACH [place] FOR ALL OTHER QUESTIONS BESIDES SURGERY APPOINTMENT:  - We are available to you at all times.   - During business hours call our General Surgery Clinic [phone], nurses, residents, and nurse practitioners are available to answer your questions.   - After hours, weekends, and holidays call [place] Page Operator at [phone] and request the operator to page [phone] [facility] Surgery Team.)  - In case of an emergency, report to your closest Emergency Room or call 911.     DANGER SIGNALS TO WATCH FOR AT HOME  Contact your surgery team immediately if you experience any of the following.   Fever of 101.5 or 38.5 (or higher) or chills  Nausea, vomiting  Pain that is poorly controlled  Increasing shortness of breath  Unrelieved constipation  Chest pain  Calf/leg pain  Increased redness, drainage, bleeding, swelling, or change in color of drainage from your wound site(s) or tube(s)    WHEN CAN YOU RESUME YOUR USUAL ACTIVITIES:  Resume regular activity gradually as tolerated. Avoid running, jogging, swimming, exercising, or contact sports unless cleared by your surgery team.    WALKING: You may walk as much as you can. Walking will improve circulation, increase your feeling of well-being, and prevent pulmonary problems.    LIFTING:  The surgery team may recommend NO HEAVY LIFTING. Do not lift more than 5-10lbs for 4-6 weeks or until your surgeon tells you to do so.     DRIVING: Do not drive while you are on prescription pain medications.      BATHING/INCISIONAL CARE:  Keep the surgical incision clean and dry. You may shower 48 hrs after your surgery. If you go home the next day after surgery, you still have the original operating room dressing in place. Please, remove the top dressing 2 days after surgery. Underneath the top dressing you will likely have Steristrips covering your incisions. Leave the Steri-strips in place. You may shower without covering your incisions.  The steri-strips will fall off on their own in 7-10 days. No bathtubs, swimming, or immersion in water for 3-4 weeks or until cleared by your surgeon.     If you are discharged with an abdominal binder after a hernia repair - recommend wearing binder at all times except showing the first month.  The second month, may remove binder for showering and sleeping.    MEDICATIONS:   You may start taking the medications you took prior to surgery unless directed otherwise by your physician. Should you have questions about this please ask your physician prior to discharge.  You may be taking pain medication whose main side effects are constipation and drowsiness. It is essential for you to maintain an adequate fruit and fluid intake to avoid constipation.  It is important to take the stool softeners and/or laxatives that have been prescribed for while you are taking narcotics for pain relief in order to avoid constipation.  If you should become constipated there are many agents that you can obtain from any pharmacy over the counter. These include:         For mild constipation: Colace, Senna, Milk of Magnesium, Miralax, Dulcolax tablet.         For moderate constipation: Milk of Magnesium, Dulcolax suppository         For severe constipation: Please speak to your primary care physician.  You may also experience nightmares, hallucinations and sweating from your pain medications. Please let you doctor know if these symptoms occur.  Some medications such as Percocet, Vicodin and Norco contain Tylenol. Avoid taking these medications more frequently then directed; too much Tylenol can cause liver damage. Do NOT take over-the-counter products that contain Tylenol/Acetaminophen if you are already on these medications.  DO NOT drive while taking narcotics!    If you are unsure if a medication is safe, ask your physician and/or pharmacist.    DIET: See Discharge Instructions    WORK:  Most patients return to work/school within 1-2 weeks.     AFTER SURGERY FOLLOW UP   - Follow up with your surgeon as directed in 1 week.To set up an appointment please call the appropriate number below:  - Also make sure that you schedule follow-up with GI to remove your biliary stent   The following appointments are schedule:  No future appointments.     For Dr. Brooke Dare, Dr. [name], Dr. Ave Filter, Dr. Charlane Ferretti, Dr. Meda Coffee: please call [phone].  For Dr. Glade Stanford, please call [phone].  For Pediatric Surgery, please call [phone].   For Dr. Francella Solian, Dr. Marguerita Merles, and Breast Surgery please call [phone].  For Dr. Delice Lesch and Vascular Surgery please call [phone].   For Dr. Ashok Croon, [place], and Endocrine Surgery please call [phone].    Follow-up with your primary care physician within 2-3 weeks.     IF YOUR APPOINTMENT IS VIA TELE HEALTH: How to join a video visit  You can conduct Video Visits with your provider on your mobile device. You will need to be signed up for [facility] and will need to download the MyChart/Epic mobile application on your mobile device.    Video Visit Patient Workflow    1. Schedule a Video Visit with [facility] scheduling.    2. Sign up for [email or web address]myChart. Create a Database administrator.    3. Download the MyChart/Epic mobile application on your mobile device. (Available on iOS and Android devices)    4. You will receive an appointment reminder in your [facility]MyChart account. You will receive a reminder text message, if you have opted in to receive text messages.    5. Log into the MyChart app on your mobile device and click on the Appointments  Section.      6. Select your scheduled telemedicine appointment and complete the eCheck-in.        7. Complete the eCheck-in consent.      8. Conduct the visit with your provider.    If you experience any technical problems, call the MyChart Technical Support line at [phone].      Patient Information Sheet     You are receiving this information sheet because you have had a contrast extravasation.    Although side effects from contrast extravasation are rare, please watch for the following symptoms over the next few days:    Compartment Syndrome  This occurs when contrast used in the procedure leaks from a vein into surrounding tissues; particularly with larger contrast amounts and in areas where there's less room for swelling such as the wrist, foot, or ankle.  Symptoms may include:  Intense pain that increases when using the muscles at the site  Tingling or burning sensations (parathesias) in the skin  Muscle tightness  Numbness or paralysis    If you have any of these symptoms, go to an emergency room immediately.    Skin Ulceration    If you  experience skin ulceration and tissue necrosis (for example: marked discoloration/black tissue or blisters) after the extravasation, you should also be seen immediately by your primary physician or in an urgent care or emergency room if after hours.

## 2022-04-22 NOTE — Op Note
1420: pt received from [facility], drowsy, not in any form of acute distress. Denies pain/ SOB/ nausea. IVF infusing well.    1430: received a call from daughter, updated on pt's stable condition & admission plan.

## 2022-04-22 NOTE — H&P
PRE-PROCEDURE H&P    Indication for Procedure:  Bile leak    Procedure: []  EGD   []  Colonoscopy  []  Enteroscopy  []  Flexible Sigmoidoscopy                         [x]  ERCP  []  EUS  []  Other:    Level of sedation intended for procedure:  []  Moderate  []  Deep  []  MAC  [x]  Anesthesia   Informed Consent Obtained Including Risks, Benefits & Alternatives:  [x]  Yes    Informed Consent Obtained For Sedation Including Risks, Benefits & Alternatives: [x]  Yes    History of Present Illness  Donna Gross is a 56 y.o. female with a past medical history significant for cholecystectomy now with bile leak.    Past Medical History  Past Medical History:   Diagnosis Date   ? Anxiety    ? Depression    ? Emphysema, unspecified (HCC/RAF)    ? Emphysema, unspecified (HCC/RAF)    ? Fibromyalgia    ? Gastritis    ? GERD (gastroesophageal reflux disease)    ? Pancreatitis        Past Surgical History  Past Surgical History:   Procedure Laterality Date   ? ABDOMINAL SURGERY     ? COLON SURGERY         Medications  See medication list    Allergies  Hydrocodone, Ibuprofen, and Morphine    Family History  Family History   Problem Relation Age of Onset   ? Heart disease Mother    ? Diabetes Brother    ? Diabetes Father    ? Anesthesia problems Neg Hx    ? Malignant hypertension Neg Hx    ? Hypotension Neg Hx    ? Malignant hyperthermia Neg Hx    ? Pseudochol deficiency Neg Hx        Social History  Social History     Tobacco Use   ? Smoking status: Every Day     Years: 20.00     Types: Cigarettes   ? Smokeless tobacco: Current   ? Tobacco comments:     1 cig per day   Substance Use Topics   ? Alcohol use: No   ? Drug use: No       Review of Systems  A complete ROS was performed, pertinent positives and negative are in the HPI, remainder of 14 systems is negative    Physical Examination  Vital Signs: BP 115/78 (Patient Position: Lying)  ~ Pulse 68  ~ Temp 36.6 ?C (97.9 ?F) (Oral)  ~ Resp 20  ~ Ht 1.626 m (5' 4'')  ~ Wt 59.6 kg (131 lb 6.3 oz)  ~ SpO2 94%  ~ BMI 22.55 kg/m?   Gen: No acute distress, answers questions appropriately.  HEENT: Anicteric sclera, oropharynx clear, moist mucous membranes  Neck: Trachea midline, good range of motion  Pulm: Clear to auscultation bilaterally, no wheezes, rales, or rhonchi  CV: Regular rate and rhythm, no murmurs, gallops or rubs  Abd: Normoactive bowel sounds, soft, nontender, nondistended, no rebound or guarding  Ext: No peripheral edema, clubbing, or cyanosis   Neuro: Alert and oriented x3, no focal neurologic deficits    Laboratory Data  Lab Results   Component Value Date    WBC 12.79 (H) 04/21/2022    HGB 9.6 (L) 04/21/2022    HCT 31.2 (L) 04/21/2022    MCV 89.1 04/21/2022    PLT  516 (H) 04/21/2022       Lab Results   Component Value Date    CREAT 0.84 04/21/2022    BUN 10 04/21/2022    NA 136 04/21/2022    K 4.6 04/21/2022    CL 100 04/21/2022    CO2 23 04/21/2022       Lab Results   Component Value Date    ALT 11 04/21/2022    AST 30 04/21/2022    ALKPHOS 105 04/21/2022    BILITOT 0.3 04/21/2022        Impression:  1. Bile leak    Recommendations  1.  Proceed with procedure.      ASA Classification: ASA 3 - Patient with moderate systemic disease with functional limitations    I have reviewed the history and physical and have determined Donna Gross to be an appropriate candidate to undergo the planned procedure with sedation and analgesia.    Eloisa Northern   04/22/2022 11:46 AM

## 2022-04-22 NOTE — Progress Notes
Contrast Extravasation    Evaluating Physician: Dalbert Batman Bettyann Birchler    Location: Left AC fossa    Line Position: Peripheral    Type of Contrast: Omnipaque 350    Estimated Amount Extravasated: 50 ml    Local Swelling: Moderate    Skin Color: Erythematous    Distal Pulses: Normal     Distal Sensation and Motor: Normal      Elevation: Yes    Local Treatment: cold compress and warm compress per department protocol    Event: No Severe Events. This was dicussed with Dr. Meda Coffee.    Follow up: Alarming symptoms were explained such as change in the skin color, developing tingling and numbness. Patient acknowledged understanding if any of these symptoms happen, she needs to notify her physician.     Compartment Syndrome and Skin Precautions Provided: Yes     Event related to accession # [id number]

## 2022-04-22 NOTE — H&P
General Surgery Note  PATIENT: Donna Gross  MRN: [id number]  DOB: August 02, 1966  DATE OF SERVICE: 04/21/2022  REASON FOR CONSULT/ADMISSION: c/f cystic duct stump leak  CHIEF COMPLAINT:   Chief Complaint   Patient presents with   ? Abdominal Pain     PT walk in to ED from home co diffuse abd pain 10/10 wo N/V since 03/27/2022. Per PT got D'cd from [facility] this morning for persistent abd pain.  Hx: cholecystectomy on 03/27/22 in [place].      Subjective:   Donna Gross is a 56 y.o. female hx COPD, PUD c/b gastric outlet obstruction s/p multiples surgeries including vagotomy, ?hernia repair with Dr [name], colon perf s/p colostomy bag s/p reversal, gallstone pancreatitis who had a cholecystectomy here on 4/8 was admitted to [facility] on 4/12 for abdominal pain and imaging workup revealed a possible cystic duct stump leak.    Review of outside records:  CT A&P on 4/19 noted subhepatic fluid collection which was drained on 4/21. On 4/25 a tubogram was performed and contrast injected through the drain showed opacification in the duodenum. However the outside facility lacked ERCP capabilities and the patient was discharged and her drain was removed.    Currently the patient is complaining of abdominal pain    Surgical history: abdomnal ulcer removed, hernia repair with Dr [name], colono perf s/p colostomy bag s/p reversal   Social history :smokes 1 cigarette /day x 20 years. Denies etoh , illicit drug use. Lives with daughter, Donna Gross.?  Objective:     Last Recorded Vital Signs:    04/21/22 2240   BP: 137/92   Pulse: (!) 107   Resp: 20   Temp: 36.6 ?C (97.9 ?F)   SpO2: 94%    Body mass index is 22.55 kg/m?Marland Kitchen  Gen - Well developed NAD  HEENT - NC AT  CV - Regular rate and rhythm  Pulm - Normal respiratory effort  Abd - Soft NT ND,   Extrem - No edema, warm, well perfused  Skin - No rashes appreciated  LABS/MICRO:  Blood type: B Positive   Lab Results   Component Value Date    WBC 14.50 (H) 03/28/2022    HGB 10.1 (L) 03/28/2022 HCT 32.3 (L) 03/28/2022    MCV 86.4 03/28/2022    PLT 367 03/28/2022     Lab Results   Component Value Date    NA 136 03/28/2022    K 4.4 03/28/2022    BUN 9 03/28/2022    CREAT 0.92 03/28/2022    GLUCOSE 126 (H) 03/28/2022     Lab Results   Component Value Date    AST 25 02/15/2022    ALT 15 02/15/2022    ALKPHOS 69 02/15/2022    BILICON <0.2 12/19/2021    BILITOT 0.3 02/15/2022    ALBUMIN 3.8 (L) 02/15/2022    TOTPRO 6.1 02/15/2022    LDH 360 (H) 01/26/2020     Lab Results   Component Value Date    AMYLASE 93 12/19/2021    LIPASE 30 02/14/2022      Lab Results   Component Value Date    LACTATE 14 04/09/2021    COVID19QLPCR Detected (A) 02/13/2022    TROPONIN <0.04 04/08/2021      IMAGING:  No imaging has been resulted in the last 24 hours   Assessment & Plan:   Donna Gross is a 56 y.o. female s/p lap chole on 4/8 admitted to OSH and found to have a possible cystic  duct stump leak. Plan to admit to the general surgery service and consult GI for ERCP with possible stenting.    Plan  - Admit to General Surgery Service  - NPO, mIVFs  - IV antibiotics (continue empiric zosyn)  - GI consult in AM for ERCP    #COPD  - O2 goal 88-92    Author:  Tyrone Apple MD, PhD PGY-2 General Surgery  [place] Surgery service pager [phone]  [facility] [address]

## 2022-04-22 NOTE — Nursing Note
No belongings received from [facility]. Pt came from [place]

## 2022-04-22 NOTE — Op Note
----------------------------------------------------------------------------------------------------------------------  (THE FOLLOWING IS FOR NURSING REFERENCE AND HAS BEEN PULLED FROM THE CURRENT CHART. PACU Phone: [phone])    Procedure(s) (LRB):  ENDOSCOPIC RETROGRADE CHOLANGIOPANCREATOGRAPHY W/ STENT PLACEMENT (N/A)  Postoperative complication [T81.9XXA]  Abdominal pain [R10.9]  Treatment Team     Provider Relationship Specialty Contact    Zosa, Erling Cruz, RN Registered Nurse --   [phone]      Alonna Buckler., MD Attending Surgery, General   [id number]    [phone]      Netta Cedars), Surgery - General Team --   [phone]      Christy Sartorius, RCP Respiratory Therapist --   [phone]          __________________________________________________________________________________    History  Past Medical History:   Diagnosis Date   ? Anxiety    ? Depression    ? Emphysema, unspecified (HCC/RAF)    ? Emphysema, unspecified (HCC/RAF)    ? Fibromyalgia    ? Gastritis    ? GERD (gastroesophageal reflux disease)    ? Pancreatitis      Past Surgical History:   Procedure Laterality Date   ? ABDOMINAL SURGERY     ? COLON SURGERY       __________________________________________________________________________________    Labs  Recent Labs     04/21/22  2355 04/21/22  2351   WBC 12.79*  --    HCT 31.2*  --    HGB 9.6*  --    PLT 516*  --    NA  --  136   K  --  4.6   CL  --  100   CO2  --  23   BUN  --  10   CREAT  --  0.84   GLUCOSE  --  96     __________________________________________________________________________________    Vital Signs  Last Recorded Vital Signs:    04/22/22 1445   BP: 146/80   Pulse: 70   Resp: 17   Temp:    SpO2: 93%     @LASTETCO2 @  Temp Readings from Last 1 Encounters:   04/22/22 36.2 ?C (97.2 ?F) (Temporal)       Pain Information (Last Filed)     Score Location Comments Edu?    Patient Asleep None None None        __________________________________________________________________________________    Intake and Output  No intake/output data recorded.  No intake/output data recorded.     __________________________________________________________________________________    IV Drips/Fluids/PCA:   ? dextrose 5%/lactated ringers 75 mL/hr (04/22/22 0947)   ? plasma-lyte-A 50 mL/hr (04/22/22 1316)     __________________________________________________________________________________    Lines, Drains, and Airways     Peripheral IV 20 G Left Arm (Active)     __________________________________________________________________________________    ----------------------------------------------------------------------------------------------------------------------      PACU NursingTransfer Note  2:51 PM, 04/22/2022      Isolation? No    Antibiotics in OR:  Last Antibiotic (last 24 hours)     Date/Time Action Medication Dose Rate    04/22/22 0618 New Bag/ Syringe/ Cartridge    piperacillin/tazobactam 3.375 g in dextrose 50 mL IVPB RTU 3.375 g 100 mL/hr        Last Antiemetic:   Med Administrations and Associated Flowsheet Values (last 4 hours) Showing orders from other encounters    Date/Time Action Medication Dose    04/22/22 1347 Given    ondansetron 4 mg/2 mL inj 4 mg        Acetaminophen given @:  Med Administrations and Associated Flowsheet Values (last 24 hours)     None        Last pain medication:   Pain Meds (last 4 hours) Showing orders from other encounters    Date/Time Action Medication Dose    04/22/22 1327 Given    fentaNYL (PF) 100 mcg/2 mL inj 50 mcg    04/22/22 1237 Given    propofol 200 mg/20 mL inj 200 mg    04/22/22 1222 Given    fentaNYL (PF) 100 mcg/2 mL inj 50 mcg    04/22/22 1119 Given    [MAR Hold] oxyCODONE tab 5 mg (MAR Hold since Wed 04/22/2022 at 1221.Hold Reason: Unreviewed Transfer Orders) 5 mg        Txp Anti-rejection medication:  Anti-rejection meds. (last 24 hours) Showing orders from other encounters    Date/Time Action Medication Dose    04/22/22 1315 Given    dexamethasone 4 mg/mL inj 6 mg          Does the patient use prescription pain medication at home (prior to admission)?Marland KitchenMarland KitchenMarland KitchenNo    Pain level: Acceptable for patient? Yes    Difficult Airway? No    Respiratory is at baseline? No: 2 liters nasal cannula now    Has the patient received Flumazenil or Narcan in PACU? No  (if ''Yes'', must be at least 45 minutes prior to transfer)     Aldrete: 9    Level of Consciousness:  Awake, Alert, Oriented x four (drowsy)    Cardiac Rhythm?  Normal Sinus    Surgical Site: s/p ERCP  Lines, Drains, and Airways     None                Diet: Clear Liquids    Tolerating liquids: Yes    Activity: MAE: Has not ambulated    Voided:  Due to Void    Significant Other Location:  Unknown    Will Transport to: Floor                       With: HA, Escort or Care Partner    Continuity of Care Issues from OR or PACU:   None

## 2022-04-22 NOTE — ED Notes
COLLECTIVE?NOTIFICATION?04/21/2022 22:35?Donna Gross?MRN: [id number]    [name]:   VZD:?[id number]  Account [id number]  Billing Account [id number]      Criteria Met      2 Visits in 30 Days    History of Sepsis Dx    6 Visits in 180 Days    Security and Safety  No Security Events were found.  ED Care Guidelines  There are currently no ED Care Guidelines for this patient. Please check your facility's medical records system.    Flags      History of Sepsis - Patient has received a diagnosis of Sepsis from an acute or post-acute setting. Apply appropriate clinical planning practices; to learn more visit [email or web address] / Attributed By: [employer] / Attributed On: 04/09/2022         Prescription Drug Data  No Prescription Drug Data was found.    E.D. Visit Count (12 mo.)  Facility Visits   [name] 4   [name] 2   [facility] [place] 3   Total 9   Note: Visits indicate total known visits.     Recent Emergency Department Visit Summary  Date Facility [facility] Type Diagnoses or Chief Complaint    Apr 21, 2022  [facility].  [place]     Apr 01, 2022  [name]  [place]  Chief Complaint: ABDOMINAL PAIN    Feb 13, 2022  [facility].  [place]      1. Calculus of gallbladder without cholecystitis without obstruction      3. Abdominal Pain      Dec 19, 2021  [facility].  [place]      1. Unspecified abdominal pain      2. Abdominal Pain      Dec 16, 2021  [name]  [place]  Chief Complaint: [name] AND ABD PAIN    Nov 08, 2021  [name]  [place]  Chief Complaint: CHEST PAIN    Nov 02, 2021  [name]  [place]  Chief Complaint: CHEST PAIN, AB PAIN ,BACK PAIN, VOMITING    Aug 07, 2021  [name]  [place]  Chief Complaint: CHEST PAIN/ AB PAIN/ VOMITING    Jul 30, 2021  [name]  [place]gency      Chronic obstructive pulmonary disease, unspecified      Benign paroxysmal vertigo, unspecified ear      Dizziness and giddiness      Allergy status to analgesic agent      Gastro-esophageal reflux disease without esophagitis      Personal history of other diseases of the digestive system      Anxiety disorder, unspecified      Depression, unspecified      Allergy status to narcotic agent        Recent Inpatient Visit Summary  Date Facility [facility] Type Diagnoses or Chief Complaint    Apr 01, 2022  [facility]  [place]  [place]patient      Unspecified intestinal obstruction, unspecified as to partial versus complete obstruction      Sepsis, unspecified  organism      Hypo-osmolality and hyponatremia      Dehydration      Diaphragmatic hernia without obstruction or gangrene      Acquired absence of other specified parts of digestive tract      Other acute postprocedural pain      Hypokalemia      Peritoneal abscess      Gastro-esophageal reflux disease without esophagitis      Mar 27, 2022  [facility].  [place]gery      2. Pruritus, unspecified      3. Calculus of gallbladder without cholecystitis without obstruction      Nov 08, 2021  [name]  [place]  Medical Surgical  Chief Complaint: ACUTE PANCREATITIS    Aug 07, 2021  [name]  [place]  Chief Complaint: CHEST PAIN/ AB PAIN/ VOMITING      Care Team  [facility] Phone Fax Service Dates   Sela Hilding, M.D. Family Medicine   Current      This patient has registered at the [facility] Emergency Department   For more information visit: [email or web address]   PLEASE NOTE:     1.   Any care recommendations and other clinical information are provided as guidelines or for historical purposes only, and providers should exercise their own clinical judgment when providing care.    2.   You may only use this information for purposes of treatment, payment or health care operations activities, and subject to the limitations of applicable [employer].    3.   You should consult directly with the organization that provided a care guideline or other clinical history with any questions about additional information or accuracy or completeness of information provided.    ? 2023 [place], [employer] - [email or web address]

## 2022-04-22 NOTE — Procedures
PATIENT NAME: Donna Gross, Donna Gross  DATE OF BIRTH: 17-Sep-1966  RECORD NUMBER: [id number]  DATE/TIME OF PROCEDURE: 04/22/2022 / 05:30:00 PM  ENDOSCOPIST: Eloisa Northern, MD  REFERRING PHYSICIAN:   FELLOW:     INDICATIONS FOR EXAMINATION: 56 yo woman with history of CBD stones and gallstones s/p cholecystectomy now with suspected bile leak. ERCP for management of bile leak.               PROCEDURE PERFORMED: ENDOSCOPIC RETROGRADE CHOLANGIOPANCREATOGRAPHY    MEDICATIONS:    General Anesthesia    PROCEDURE TECHNIQUE:  Consent:? After discussion of the potential benefits, risks (bleeding, perforation, pancreatitis and cholangitis), limitations, and alternatives, the patient consented to the procedure.   Preparation:? The patient was found to be fit for deep sedation.   ASA Classification: Class 3 - Patient has moderate systemic disturbance that may or may not be related to the disorder requiring surgery.   Procedure:? The 1T upper endoscope was advanced with ease to the duodenum. The patient's toleration of the procedure was good. The views were good.    Total Withdrawl Time:   Total Insertion Time:                              EXTENT OF EXAM:  Second portion of the duodenum            INSTRUMENTS:   #[id number] (NEW1T)  TECHNICAL DIFFICULTY:  No   LIMITATIONS:      None TOLERANCE:   Good  VISUALIZATION:  Good                                                         FINDINGS:   Luminal: Scout films were unremarkable. The esophagus appeared to be normal. No evidence of esophagitis in the esophagus. The stomach had a partial resection with patent gastrojejunostomy, consistent with Billroth II anatomy. The jejunum and duodenum   mucosa appeared normal with no ulcers or erosions. The jejunum appeared normal. The efferent common limb was intubated >50 cm beyond the stomach and appeared normal. The pancreaticobiliary limb was then deeply intubated. The ampulla was identified and   appeared to be at the edge of two medium to large periampullary diverticulae.    Biliary findings: The ampulla had evidence of prior sphincterotomy. Deep biliary cannulation was then achieved using the Billroth 2 sphincterotome over a guidewire with  difficulty. Cholangiogram was obtained. The CBD was dilated measuring 10 mm in   diameter with a ''S'' shaped CBD. No filling defects were seen. On occlusion cholangiogram, no obvious bile leak was seen. However, given the clinical concern for a bile duct leak following recent cholecystectomy, the decision was made to place a biliary   stent. Due to the ''S'' shaped common bile duct, a 7 Fr stent was elected to be placed as it is more flexible than a larger caliber plastic stent. A 7 Fr x 7 cm double pigtail plastic biliary stent was placed in the transpapillary position. The procedure   was then completed.  ?  The procedure was then completed. The pancreatic duct/pancreaticojejunostomy was not entered.  ?  My supervision was provided during the fluoroscopy used during the procedure. The fluoroscopy images of the bile duct anatomy were obtained, reviewed, and interpreted in real time  during the procedure.    ESTIMATED BLOOD LOSS:   None     DIAGNOSIS:  1. No obvious bile leak seen. But given clinical concern for bile leak following recent cholecystectomy, a 7 Fr biliary stent was placed.  2. Anatomy consistent with Billroth 2.    RECOMMENDATIONS:  1. Watch for complications including bleeding, perforation, and pancreatitis.  2. Consider cross sectional imaging to evaluate for any residual fluid collection from suspected bile leak.  3. Repeat ERCP 2-3 months for stent removal/exchange.            This electronic signature authenticates all electronic and/or handwritten documentation, including orders, generated by the signer during the episode of care contained in this record.  04/22/2022 02:13:45 PM By Eloisa Northern

## 2022-04-22 NOTE — ED Notes
Report Thayer Ohm, RN ( )

## 2022-04-22 NOTE — ED Provider Notes
[facility]  Emergency Department Service Report    Donna Gross 56 y.o. female , presents with Abdominal Pain      Triage   Arrived on 04/21/2022 at 10:35 PM   Arrived by Walk-in [[id number]]    ED Triage Vitals   Temp Temp Source BP Heart Rate Resp SpO2 O2 Device Pain Score Weight   04/21/22 2240 04/21/22 2240 04/21/22 2240 04/21/22 2240 04/21/22 2240 04/21/22 2240 -- 04/21/22 2246 04/21/22 2237   36.6 ?C (97.9 ?F) Oral 137/92 (!) 107 20 94 %  Ten 59.6 kg (131 lb 6.3 oz)       Pre hospital care:       Allergies   Allergen Reactions   ? Hydrocodone Other (See Comments)     ''burns a hole in stomach''  Tolerates hydromorphone   ? Ibuprofen Other (See Comments)     GI discomfort    ''hurts my stomach''   ? Morphine Itching     Tolerates hydromorphone       History   HPI   Donna Gross is a 56 y.o. female w/ hx of GERD, gastritis, pancreatitis, emphysema, fibromyalgia, abdominal surgery and colon surgery who presents to the ED for evaluation of abdominal pain x1 month s/p cholecystectomy done on 03/27/22. Pt reports that pain is diffuse, 10/10 in severity. Pt was recently admitted for persistent abdominal pain at [facility] then discharged this morning.     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 Anxiety    ? Depression    ? Emphysema, unspecified (HCC/RAF)    ? Emphysema, unspecified (HCC/RAF)    ? Fibromyalgia    ? Gastritis    ? GERD (gastroesophageal reflux disease)    ? Pancreatitis         Past Surgical History:   Procedure Laterality Date   ? ABDOMINAL SURGERY     ? COLON SURGERY          Past Family History   family history includes Diabetes in her brother and father; Heart disease in her mother.     Past Social History   she reports that she has been smoking cigarettes. She uses smokeless tobacco. She reports that she does not drink alcohol, does not use drugs, and does not engage in sexual activity.       Physical Exam   Physical Exam    ED Course          Laboratory Results   Labs Reviewed - No data to display    Imaging Results     No orders to display       Administered Medications     Medication Administration from 04/21/2022 2236 to 04/21/2022 2259     None          Procedures   Procedural Sedation  Procedures    Medical Decision Making   Donna Gross is a 56 y.o. female ***    MDM  Clinical Impression   No diagnosis found.      Prescriptions     New Prescriptions    No medications on file       Disposition and Follow-up   Disposition: Refresh note to pull in Disposition    No future appointments.    Follow up with:  No follow-up provider specified.    Return precautions are  specified on After Visit Summary.

## 2022-04-22 NOTE — H&P
UPDATED H&P REQUIREMENT    For [facility] and [name]    WHAT IS THE STATUS OF THE PATIENT'S MOST CURRENT HISTORY AND PHYSICAL?   - The most current H&P was performed within the past 24 hours. No additional updated H&P documentation is necessary.     REFER TO MEDICAL STAFF POLICIES REGARDING PRE-PROCEDURE HISTORY AND PHYSICAL EXAMINATION AND UPDATED H&P REQUIREMENTS BELOW:    [facility] and [name] Medical Staff Policy 200 - For Patients Undergoing Procedures Requiring Moderate or Deep Sedation, General Anesthesia or Regional Anesthesia    Contents of a History and Physical Examination (H&P):    The H&P shall consist of chief complaint, history of present illness, allergies and medications, relevant social and family history, past medical history, review of systems and physical examination, and assessment and plan appropriate to the patient's age.    For Patients Undergoing Procedures Requiring Moderate or Deep Sedation, General Anesthesia or Regional Anesthesia:    1. An H&P shall be performed within 24 hours prior to the procedure by a qualified member of the medical staff or designee with appropriate privileges, except as noted in item 2 below.    2. If a complete history and physical was performed within thirty (30) calendar days prior to the patient's admission to the Medical Center for elective surgery, a member of the medical staff assumes the responsibility for the accuracy of the clinical information and will need to document in the medical record within twenty-four (24) hours of admission and prior to surgery or major invasive procedure, that they either attest that the history and physical has been reviewed and accepted, or document an update of the original history and physical relevant to the patient's current clinical status.    3. Providing an H&P for  patients undergoing surgery under local anesthesia is at the discretion of the Attending Physician.     4. When a procedure is performed by a dentist, podiatrist or other practitioner who is not privileged to perform an H&P, the anesthesiologist's assessment immediately prior to the procedure will constitute the 24 hour re-assessment.The dentist, podiatrist or other practitioner who is not privileged to perform an H&P will document the history and physical relevant to the procedure.    5. If the H&P and the written informed consent for the surgery or procedure are not recorded in the patient's medical record prior to surgery, the operation shall not be performed unless the attending physician states in writing that such a delay could lead to an adverse event or irreversible damage to the patient.    6. The above requirements shall not preclude the rendering of emergency medical or surgical care to a patient in dire circumstances.

## 2022-04-22 NOTE — OR Nursing
atient arriving in [place] 1 from ER. Awake and alert. ID verified verbally and armband. Aware of GI procedure. Anesthesiologist and GI MD talked to pt. Consents obtained.

## 2022-04-22 NOTE — Nursing Note
STAT RN:  Pt needs a PIV for CT contrast.   The existing PIV was infiltrated during CT.  Attempted IV sticks 3 by ED nurse.   Ultrasound guide was used to place # 20 g PIV to the left arm with +dark blood return, using aseptic technique. Pt tolerated the IV insertion well. The IV insertion was done without any issues. Dressing applied and site secured. Notified primary nurse.      Carl Best, RN

## 2022-04-22 NOTE — ED Notes
Report to Chi, RN

## 2022-04-22 NOTE — Consults
[facility] DEPARTMENT OF MEDICINE  DIVISION OF GASTROENTEROLOGY  INTERVENTIONAL ENDOSCOPY SERVICE CONSULT NOTE    PATIENT: Donna Gross  MRN: [id number]  DOB: Nov 20, 1966    ADMISSION DATE: 04/22/2022    HOSPITAL DAY: 0  DATE OF SERVICE: 04/22/2022   ATTENDING ON SERVICE (CONSULT REQUESTOR): Dr. [name]  ATTENDING ON IES TEAM: Dr. Eloisa Northern  REASON FOR CONSULT: Bile leak    HPI/SUBJECTIVE  Donna Gross is a 56 y.o. female with perforated gastric ulcer s/p billroth 2 surgery, recent lap cholecystectomy 04/07, who presented to OSH 04/12 afterwards with worsening upper abdominal pain. Found to have fluid collection in GB fossa that was enlarging on serial imaging s/p IR-guided drain 04/21 (Cx with MDR E-Coli s/p one week of Meropenem till 04/30), tubulogram 04/25 revealed contrast going through bile ducts into the duodenum consistent with bile leak, drain removed 05/02 on discharge day from OSH who presents with persistent abdominal pain. GI consulted for ERCP with stent placement    Patient had chronic symptomatic cholelithiasis and prior obstructing choledocholithiasis that previously required ERCP with sphincterotomy 03/2021. She underwent a lap cholecystectomy here on 03/27/2022 which was challenging due to extensive adhesions from prior surgeries (billroth 2 for perforated gastric ulcer, and partial colectomy for colonic perforation). She did well post-operatively with pain control medications and tolerated PO before discharge on 03/31/2022. One day following her discharge, she presented to OSH for worsening RUQ abdominal pain and nausea. Course as below:    - Initial Labs were notable for mild leukocytosis and normal LFTs.  - RUQ [place] 04/12: GB fluid collection that is causing mild biliary dilation due to extrinsic compression.   - HIDA SCAN 04/13: No bile leak seen  - CT 04/19 for worsening pain and leukocytosis: Enlarging enchanding fluid collection in GB fossa concerning for abscess  - 04/21: CT-guided drain placed: CX with MDR E-Coli. ID consulted and recommended 10 days of Meropenem which was completed on 04/30  - Persistent dark drainage from the drain warranted tubulogram on 04/25: contrast went into the bile ducts then through the ampulla into the duodenum.  - GI consulted and recommended transfer for ERCP to outside hospital.  - Transfer logistics were challenging and patient was eventually discharged 05/02 with instructions to present here.  - Drain was removed yesterday, per patient minimal output over last 24hrs.    In ED here, HDS, Leukocytosis 12.7, stable Hg 9.6. LFTs unremarkable. CT without contrast (contrast leaked through IV) with intrahepatic and extrahepatic dilation and stranding in the RUQ without fluid collection.      MEDS  Scheduled:  acetaminophen, 650 mg, Oral, Q6H  enoxaparin, 40 mg, Subcutaneous, QHS  methocarbamol, 750 mg, Oral, TID  [COMPLETED] piperacillin/tazobactam, 3.375 g, Intravenous, Once **AND** piperacillin/tazobactam, 3.375 g, Intravenous, Q8H  QUEtiapine, 100 mg, Oral, BID    PRN:  fluticasone-salmeterol, HYDROmorphone, ipratropium-albuterol, oxyCODONE    Infusions:  ? dextrose 5%/lactated ringers            PHYSICAL EXAM    Temp:  [36.6 ?C (97.9 ?F)] 36.6 ?C (97.9 ?F)  Heart Rate:  [63-107] 68  Resp:  [18-20] 20  BP: (115-137)/(70-92) 115/78  NBP Mean:  [107] 107  SpO2:  [93 %-94 %] 94 %  BMI Readings from Last 1 Encounters:   04/21/22 22.55 kg/m?       General: NAD  HEENT: NCAT, no conjunctival pallor or scleral icterus  Neck: No LAD  Cardiac: RRR, S1S2  Pulmonary: No respiratory distress  Abd: BS+, soft, ND, TTP  in RUQ and epigastric areas without rebound or guarding.   Ext: Warm, no edema  Neuro: A&Ox3  Vasc: 2+ radial pulses.  Psych: Normal mood    LABS/STUDIES    Labs Independently Interpreted by me today  Lab Results   Component Value Date    WBC 12.79 (H) 04/21/2022    HGB 9.6 (L) 04/21/2022    HCT 31.2 (L) 04/21/2022    MCV 89.1 04/21/2022    PLT 516 (H) 04/21/2022     WBC:   Lab Results Component Value Date/Time    WBC 12.79 (H) 04/21/2022 11:55 PM    WBC 8.7 03/09/2021 12:00 AM      INR   Lab Results   Component Value Date/Time    INR 1.0 04/08/2021 10:20 PM    INR 0.90 03/09/2021 12:00 AM       Sodium:   Lab Results   Component Value Date/Time    NA 136 04/21/2022 11:51 PM    NA 140 03/09/2021 12:00 AM      Creatinine:   Lab Results   Component Value Date/Time    CREAT 0.84 04/21/2022 11:51 PM    CREAT 1.0 03/09/2021 12:00 AM      BUN:   Lab Results   Component Value Date/Time    BUN 10 04/21/2022 11:51 PM    BUN 22 03/09/2021 12:00 AM      Potassium:   Lab Results   Component Value Date/Time    K 4.6 04/21/2022 11:51 PM    K 4.9 03/09/2021 12:00 AM      AST, ALT, and Alk Phos Trend   Lab Results   Component Value Date/Time    AST 30 04/21/2022 11:51 PM    AST 25 02/15/2022 04:54 AM    AST 31 02/14/2022 08:15 AM    AST 43 03/09/2021 12:00 AM    ALT 11 04/21/2022 11:51 PM    ALT 15 02/15/2022 04:54 AM    ALT 14 02/14/2022 08:15 AM    ALT 21 03/09/2021 12:00 AM    ALKPHOS 105 04/21/2022 11:51 PM    ALKPHOS 69 02/15/2022 04:54 AM    ALKPHOS 74 02/14/2022 08:15 AM    ALKPHOS 97 03/09/2021 12:00 AM      Bilirubin Trend:   Lab Results   Component Value Date/Time    BILITOT 0.3 04/21/2022 11:51 PM    BILITOT 0.3 02/15/2022 04:54 AM    BILITOT 0.3 02/14/2022 08:15 AM    BILITOT 0.3 02/13/2022 09:25 AM      Conjugated Bilirubin:   Lab Results   Component Value Date/Time    BILICON <0.2 12/19/2021 09:54 AM        Imaging Tests Independently Interpreted by me Today   Last CT a/p: Results for orders placed during the hospital encounter of 04/21/22    CT abd+pelvis w contrast 04/22/2022 (Final)    Impression  This is essentially a noncontrast enhanced study due to contrast extravasation as described above.  1.  Status post cholecystectomy with similar degree of intrahepatic and extrahepatic biliary dilatation.  2.  Subtle stranding of the hepatorenal fossa/right anterior renal fascia with mild thickening of the hepatic flexure of the colon and second portions of duodenum. No fluid collection. Findings are nonspecific but may be posttreatment/inflammatory  changes secondary to recent subhepatic fluid collection and drainage per EMR.  3.  New small to moderate pericardial effusion.    I, Lou Cal, M.D., have reviewed this radiological study personally and I  am in full agreement with the findings of the report presented here.    Dictated by: Kirk Ruths   04/22/2022 6:39 AM    Signed by: Lou Cal   04/22/2022 8:09 AM     Procedures  Last ERCP:   Results for orders placed or performed during the hospital encounter of 04/08/21   ENDOSCOPIC RETROGRADE CHOLANGIOPANCREATOGRAPHY    Transcription    PATIENT NAME:       Tammen, Tylicia  DATE OF BIRTH:       Feb 03, 1966  RECORD NUMBER:      [id number]  DATE/TIME OF PROCEDURE:     04/11/2021 / 03:45:00 PM  ENDOSCOPIST:       Mechele Claude, MD  REFERRING PHYSICIAN:        FELLOW:           INDICATIONS FOR EXAMINATION:      History of Billroth II; Choledocholithiasis               PROCEDURE PERFORMED:             ENDOSCOPIC RETROGRADE CHOLANGIOPANCREATOGRAPHY - Sphincterotomy  ENDOSCOPIC RETROGRADE CHOLANGIOPANCREATOGRAPHY - with removal of stone(s) from bile duct  ENDOSCOPIC RETROGRADE CHOLANGIOPANCREATOGRAPHY - Endoscopic catheterization of the biliary ductal system, radiological supervision and interpretation  PUSH / SMALL BOWEL ENTEROSCOPY    MEDICATIONS:    Indomethacin 100mg  PR  General anesthesia    PROCEDURE TECHNIQUE:  After the risks/benefits/alternatives of the procedure were explained in detail (including risks of bleeding, perforation, infection, pancreatitis), adequate sedation was induced.  The therapeutic gastroscope with distal cap was advanced through the   mouth, down the esophagus, into the small bowel, and up the afferent (pancreaticobiliary limb) of the post-Billroth II anatomy to the cul-de-sac. It was slowly withdrawn until the ampulla was seen    TOTAL WITHDRAWL TIME:    TOTAL INSERTION TIME:      EXTENT OF EXAM:  Bile duct            INSTRUMENTS:     TECHNICAL DIFFICULTY:  No   LIMITATIONS:        TOLERANCE:   Good  VISUALIZATION:  Good    FINDINGS:   Scout films were unremarkable. The esophagus appeared to be normal. No evidence of esophagitis in the esophagus. The stomach had a partial resection with patent gastrojejunostomy, consistnet with Billroth II anatomy. The jejunum and duodenum mucosa   appeared normal with no ulcers or erosions. The jejunum appeared normal. The efferent common limb was intubated >50 cm beyond the stomach and appeared normal. The pancreaticobiliary limb was then deeply intubated. The ampulla was identified and appeared   to be at the edge of two medium to large periampullary diverticulae.    Biliary findings: The bile duct was selectively cannulated with a sphincterotome and .035'' jagwire with difficulty. Cholangiogram was obtained. The CBD was dilated measuring 8-9 mm in diameter. A single moderate sized filling defect was seen in the   biliary tree. An adequate sphincterotomy was performed using the B2 tome followed by multiple balloon sweeps with removal of a small gold colored stones and stone fragments. A post-stone removal occlusion cholangiogram showed no residual stones and   otherwise normal cholangiogram. Cystic duct filling was seen. The pancreatic duct was not opacified. The procedure was then completed.    The procedure was then completed. The pancreatic duct/pancreaticojejunostomy was not entered.    My supervision was provided during the fluoroscopy used during the procedure. The fluoroscopy images of the bile  duct anatomy were obtained, reviewed, and interpreted in real time during the procedure.    ESTIMATED BLOOD LOSS:   None     DIAGNOSIS:  1. Successful ERCP in patient with Billroth II anatomy.  2. Choledocholithiasis, s/p sphincterotomy and balloon sweeps with duct clearance as above.    RECOMMENDATIONS:  1. Watch for complications including bleeding, perforation, and pancreatitis.  2. Trend liver tests.  3. Aggressive post-procedure hydration with lactated ringers (ordered) to help prevent post-ERCP pancreatitis.  4. Recommend surgical consultation for consideration of cholecystectomy to prevent future complications of gallstone disease.          This electronic signature authenticates all electronic and/or handwritten documentation, including orders, generated by the signer during the episode of care contained in this record.  04/11/2021 06:53:18 PM By Mechele Claude             I reviewed these specialist notes that directly relate to the patient's acute and/or chronic medical problems with documentation of the salient findings, if AVW:[id number] Brendolyn Patty, MD in Emergency Medicine on 04/22/2022.  Fransisco Beau, MD, MPH in Medicine, Gastroenterology on 03/11/2021.  MARK J. Fredric Mare, MD in Surgery, General on 04/21/2022.    I have spoken with the following physicians/teams regarding the patient's care today:  Discussed with primary team regarding plan. Our conclusions were as below    ASSESSMENT & PLAN:  ====================  Mayda Shippee is a 56 y.o. female with perforated gastric ulcer s/p billroth 2 surgery, recent lap cholecystectomy 04/07, who presented to OSH 04/12 afterwards with worsening upper abdominal pain. Found to have fluid collection in GB fossa that was enlarging on serial imaging s/p IR-guided drain 04/21 (Cx with MDR E-Coli s/p one week of Meropenem till 04/30), tubulogram 04/25 revealed contrast going through bile ducts into the duodenum consistent with bile leak, drain removed 05/02 on discharge day from OSH who presents with persistent abdominal pain. GI consulted for ERCP with stent placement    # Cystic Duct Bile Leak  # Billroth 2 Anatomy  # Recent Cholecystectomy 03/27/2022  # Mild Intrahepatic & Extrahepatic biliary dilation  - Bile leak confirmed per GB fossa drain tubulogram at the OSH on 04/14/2022 as contrast went through the bile ducts into the duodenum.  - Had transient drain placed at outside hospital 04/21-05/02.  - CT without evidence of residual fluid collection.  - Discussed the case with surgical colleagues and agreement on pursuing ERCP  - Recommendations:   --> NPO for ERCP today with stent placement.   --> Daily LFTs, D. Bil, INR   --> Hold pharmacologic DVT ppx      Code Status:  Full Code    Author:    Bayard Males 04/22/2022 8:43 AM  GI Fellow, pager [phone]  If after 5pm or on weekends, please page GI fellow on call    Discussed with attending on service, Dr. Eloisa Northern

## 2022-04-22 NOTE — ED Notes
Report received from John RN.

## 2022-04-23 DIAGNOSIS — K651 Peritoneal abscess: Secondary | ICD-10-CM

## 2022-04-23 DIAGNOSIS — Z1612 Extended spectrum beta lactamase (ESBL) resistance: Secondary | ICD-10-CM

## 2022-04-23 DIAGNOSIS — A498 Other bacterial infections of unspecified site: Secondary | ICD-10-CM

## 2022-04-23 LAB — Phosphorus: PHOSPHORUS: 3.9 mg/dL (ref 2.3–4.4)

## 2022-04-23 LAB — Magnesium: MAGNESIUM: 1.6 meq/L (ref 1.4–1.9)

## 2022-04-23 LAB — Comprehensive Metabolic Panel
CREATININE: 0.85 mg/dL (ref 0.60–1.30)
SODIUM: 140 mmol/L (ref 135–146)

## 2022-04-23 LAB — Hepatic Funct Panel: BILIRUBIN,TOTAL: <0.2 mg/dL (ref 0.1–1.2)

## 2022-04-23 LAB — Basic Metabolic Panel: CREATININE: 0.75 mg/dL (ref 0.60–1.30)

## 2022-04-23 LAB — CBC
ABSOLUTE NUCLEATED RBC COUNT: 0 10*3/uL (ref 0.00–0.00)
RED CELL DISTRIBUTION WIDTH-SD: 64.7 fL — ABNORMAL HIGH (ref 36.9–48.3)

## 2022-04-23 MED ADMIN — METHOCARBAMOL 750 MG PO TABS: 750 mg | ORAL | @ 03:00:00 | Stop: 2022-04-24 | NDC 60687056811

## 2022-04-23 MED ADMIN — OXYCODONE HCL 5 MG PO TABS: 5 mg | ORAL | @ 03:00:00 | Stop: 2022-05-22 | NDC 00406055262

## 2022-04-23 MED ADMIN — HYDROMORPHONE HCL 1 MG/ML IJ SOLN: .2 mg | INTRAVENOUS | @ 16:00:00 | Stop: 2022-04-26 | NDC 00409128331

## 2022-04-23 MED ADMIN — ONDANSETRON HCL 4 MG/2ML IJ SOLN: 4 mg | INTRAVENOUS | Stop: 2022-04-28 | NDC 60505613000

## 2022-04-23 MED ADMIN — HYDROMORPHONE HCL 1 MG/ML IJ SOLN: .2 mg | INTRAVENOUS | @ 22:00:00 | Stop: 2022-04-29 | NDC 00409128331

## 2022-04-23 MED ADMIN — ENOXAPARIN SODIUM 40 MG/0.4ML IJ SOSY: 40 mg | SUBCUTANEOUS | @ 03:00:00 | Stop: 2022-04-30

## 2022-04-23 MED ADMIN — DEXTROSE 5 %/LACTATED RINGERS: 50 mL/h | INTRAVENOUS | @ 10:00:00 | Stop: 2022-04-28

## 2022-04-23 MED ADMIN — HYDROMORPHONE HCL 1 MG/ML IJ SOLN: .2 mg | INTRAVENOUS | @ 07:00:00 | Stop: 2022-04-26 | NDC 00409128331

## 2022-04-23 MED ADMIN — DEXTROSE 5 %/LACTATED RINGERS: 50 mL/h | INTRAVENOUS | @ 10:00:00 | Stop: 2022-04-28 | NDC 00338012504

## 2022-04-23 MED ADMIN — ERTAPENEM IVPB: 1 g | INTRAVENOUS | @ 01:00:00 | Stop: 2022-04-25 | NDC 55150028220

## 2022-04-23 MED ADMIN — QUETIAPINE FUMARATE 100 MG PO TABS: 100 mg | ORAL | @ 16:00:00 | Stop: 2022-04-28 | NDC 50268063211

## 2022-04-23 MED ADMIN — HYDROMORPHONE HCL 1 MG/ML IJ SOLN: .2 mg | INTRAVENOUS | Stop: 2022-04-26 | NDC 00409128331

## 2022-04-23 MED ADMIN — ACETAMINOPHEN 325 MG PO TABS: 650 mg | ORAL | @ 07:00:00 | Stop: 2022-04-24 | NDC 00904677361

## 2022-04-23 MED ADMIN — ONDANSETRON HCL 4 MG/2ML IJ SOLN: 4 mg | INTRAVENOUS | @ 16:00:00 | Stop: 2022-04-28 | NDC 60505613000

## 2022-04-23 MED ADMIN — ACETAMINOPHEN 325 MG PO TABS: 650 mg | ORAL | @ 13:00:00 | Stop: 2022-04-24 | NDC 00904677361

## 2022-04-23 MED ADMIN — QUETIAPINE FUMARATE 100 MG PO TABS: 100 mg | ORAL | @ 03:00:00 | Stop: 2022-04-28 | NDC 60687034911

## 2022-04-23 MED ADMIN — METHOCARBAMOL 750 MG PO TABS: 750 mg | ORAL | @ 19:00:00 | Stop: 2022-04-24 | NDC 60687056811

## 2022-04-23 MED ADMIN — ACETAMINOPHEN 325 MG PO TABS: 650 mg | ORAL | @ 19:00:00 | Stop: 2022-04-24 | NDC 00904677361

## 2022-04-23 MED ADMIN — METHOCARBAMOL 750 MG PO TABS: 750 mg | ORAL | @ 13:00:00 | Stop: 2022-04-24 | NDC 60687056811

## 2022-04-23 MED ADMIN — OXYCODONE HCL 5 MG PO TABS: 5 mg | ORAL | @ 21:00:00 | Stop: 2022-04-24 | NDC 00406055262

## 2022-04-23 NOTE — Other
Patient's Clinical Goal:   Clinical Goal(s) for the Shift: VSS, pain control, IVF, NPO x meds, ERCP, comfort  Identify possible barriers to advancing the care plan:   Stability of the patient: Moderately Unstable - medium risk of patient condition declining or worsening    Progression of Patient's Clinical Goal:     Hx:  COPD, PUD c/b gastric outlet obstruction s/p multiples surgeries including vagotomy, hernia repair, colon perf s/p colostomy bag s/p reversal, gallstone pancreatitis     Dx: Post op complication, abdominal pain    cholecystectomy here on 4/8 was admitted to [facility] on 4/12 for abdominal pain and imaging workup revealed a possible cystic duct stump leak.     Response to Plan of Care and any changes in condition (clinical condition, any concerning assessments, events, interventions; response to POC effectiveness or changes made):    Post op, to 4 MN just prior to 5/3 night shift  ENDOSCOPIC RETROGRADE CHOLANGIOPANCREATOGRAPHY W/ STENT PLACEMENT     A&Ox4, non-monitored, SPo2 >98% 2L NC    Pain (abdominal region) controlled with scheduled tylenol, methocarbamol, PRN IV dilaudid Q6    NPO except meds    IV access upper left arm 20G continuous gtt D5 LR @75mL /hr (left hand 20G as well, not preferred by patient)    BMAT 4, voiding via restroom, later primafit for convenience.      Interdisciplinary communication (team member communication):     None?     Psychosocial communication (family or patient issues potentially affecting care):     None     Plan and Disposition (progression toward specific goals of care/hospitalization and discharge):      Pain management   IV antibiotics   Activity as tolerated     Update BOOST

## 2022-04-23 NOTE — Consults
IP CM ACTIVE DISCHARGE PLANNING  Department of Care Coordination      Admit [phone]  Anticipated Date of Discharge: 04/24/2022    Following GN:FAOZ, Dineen Kid., MD      Today's short update     Per notes, pt had ERCP with stent placement yesterday, advance to [facility] today and pain management, on IV antibiotics.    Disposition     Pending plan  Per notes, pt had ERCP with stent placement yesterday, advance to [facility] today and pain management, on IV antibiotics.           Donna Gross,  04/23/2022

## 2022-04-23 NOTE — Consults
Infectious Diseases Consultation    Patient: Donna Gross  MRN: [id number]  DOB: [birth year]  Date of Service: 04/23/2022  Requesting Physician: Alonna Buckler., MD  Reason for Consultation: Abdominal abscess    Chief Complaint: Abdominal pain    History of Present Illness:  Donna Gross is a 56 y.o. female with history of COPD, PUD c/b gastric outlet obstruction s/p multiple surgeries including vagotomy, hernia repair, colonic perforation s-p repair, gallstone pancreatitis s-p cholecystectomy admitted to OSH with abdominal pain found to have possible cystic duct stump leak c/b sub hepatic abscess s-p drainage by IR 4/21 with cultures growing ESBL E coli now transferred to [facility] for HLOC now s-p ERCP currently on ertapenem.    4/21: CT guided drainage of sub hepatic fluid collection at OSH, cultures growing ESBL E coli    Plan from OSH ID note:      Hospital Course (Key Events): Date of Admission 04/22/2022  5/3 Transferred for HLOC, underwent ERCP without clear evidence of biliary leak but stent placed.  5/4: ID consulted regarding duration of antibiotics. Doing well clinically, afebrile, normal WBC, minimal abdominal pain. Repeat CT of the abdomen without evidence of leakage or abscess. Patient has been receiving mero or ertapenem since 4/23.    Antimicrobial History:  Meropenem  Ertapenem    Review of Systems:  A 14-point review of systems was performed and is negative except for as noted above.    Past Medical History:  Past Medical History:   Diagnosis Date   ? Anxiety    ? Depression    ? Emphysema, unspecified (HCC/RAF)    ? Emphysema, unspecified (HCC/RAF)    ? Fibromyalgia    ? Gastritis    ? GERD (gastroesophageal reflux disease)    ? Pancreatitis       Past Surgical History:  Past Surgical History:   Procedure Laterality Date   ? ABDOMINAL SURGERY     ? COLON SURGERY       Allergies:   Allergies   Allergen Reactions   ? Hydrocodone Other (See Comments)     ''burns a hole in stomach''  Tolerates hydromorphone   ? Ibuprofen Other (See Comments)     GI discomfort    ''hurts my stomach''   ? Morphine Itching     Tolerates hydromorphone     Prior to Admission Medications:  Medications Prior to Admission   Medication Sig Dispense Refill Last Dose   ? acetaminophen 325 mg tablet Take 2 tablets (650 mg total) by mouth every six (6) hours as needed for Pain.      ? clobetasol 0.05% cream Apply topically three (3) times daily as needed (rash).      ? docusate 100 mg capsule Take 1 capsule (100 mg total) by mouth two (2) times daily While on narcotic pain medication. 30 capsule 2    ? FLUoxetine 40 mg capsule Take 1 capsule (40 mg total) by mouth daily.      ? fluticasone-salmeterol 250-50 mcg/dose diskus Inhale 1 puff two (2) times daily as needed (SOB).      ? ipratropium-albuterol 20-100 mcg/act inhaler Inhale 2 puffs three (3) times daily as needed (SOB).      ? methocarbamol 750 mg tablet Take 1 to 2 tablets by mouth every 6 to 8 hours as needed for pain muscle pain/spasms. 30 tablet 0    ? metoclopramide 10 mg tablet Take 1 tablet (10 mg total) by mouth three (3) times daily before meals. 90  tablet 0    ? multivitamin tablet Take 1 tablet by mouth daily. 30 tablet 0    ? Naloxone HCl 4 MG/0.1ML LIQD Call 911. Administer a single spray intranasally into one nostril for opioid overdose. May repeat in 3 minutes if patient is not breathing.. 2 each 2    ? ondansetron ODT 4 mg disintegrating tablet Take 1 tablet (4 mg total) by mouth every six (6) hours as needed for Nausea or Vomiting. 12 tablet 0    ? oxyCODONE 5 mg tablet Take 1 to 2 tablets by mouth every 6 to 8 hours as needed for pain.. 30 tablet 0    ? oxyCODONE-acetaminophen 5-325 mg tablet Take 2 tablets by mouth every four (4) hours as needed. Max Daily Amount: 12 tablets 15 tablet 0    ? pantoprazole 40 mg DR tablet Take 1 tablet (40 mg total) by mouth daily.      ? polyethylene glycol powder Mix and drink 17 grams in liquid by mouth daily while on narcotic pain medication. 510 g 0    ? quetiapine 100 mg tablet Take 1 tablet (100 mg total) by mouth two (2) times daily.      ? sucralfate 1 g/10 mL suspension Take 10 mLs (1 g total) by mouth three (3) times daily with meals and at bedtime. 420 mL 3    ? traZODone 100 mg tablet Take 1 tablet (100 mg total) by mouth at bedtime.        Medications:  Scheduled Meds:  ? acetaminophen  650 mg Oral Q6H   ? enoxaparin  40 mg Subcutaneous QHS   ? ertapenem IV  1 g Intravenous Q24H   ? methocarbamol  750 mg Oral TID   ? QUEtiapine  100 mg Oral BID     Continuous Infusions:  ? dextrose 5%/lactated ringers 75 mL/hr (04/23/22 0246)     PRN Meds:.fluticasone-salmeterol, HYDROmorphone, ipratropium-albuterol, ondansetron injection/IVPB, oxyCODONE    Family History:  Family History   Problem Relation Age of Onset   ? Heart disease Mother    ? Diabetes Brother    ? Diabetes Father    ? Anesthesia problems Neg Hx    ? Malignant hypertension Neg Hx    ? Hypotension Neg Hx    ? Malignant hyperthermia Neg Hx    ? Pseudochol deficiency Neg Hx      No relevant family history of infections or immunocompromising conditions.     Social History:  Social History     Tobacco Use   ? Smoking status: Every Day     Years: 20.00     Types: Cigarettes   ? Smokeless tobacco: Current   ? Tobacco comments:     1 cig per day   Substance Use Topics   ? Alcohol use: No   ? Drug use: No     Physical Exam:  Temp:  [36.2 ?C (97.2 ?F)-36.9 ?C (98.4 ?F)] 36.9 ?C (98.4 ?F)  Heart Rate:  [67-100] 67  Resp:  [13-20] 16  BP: (88-150)/(63-98) 116/67  NBP Mean:  [72-110] 81  SpO2:  [93 %-100 %] 98 %  Temp (24hrs), Avg:36.6 ?C (97.8 ?F), Min:36.2 ?C (97.2 ?F), Max:36.9 ?C (98.4 ?F)    Intake and Output:   Last Two Completed Shifts:  I/O last 2 completed shifts:  In: 915 [I.V.:900; Other:15]  Out: -   Vitals:    04/21/22 2237   Weight: 59.6 kg (131 lb 6.3 oz)   Height: 1.626  m (5' 4'')     System Check if normal Positive or additional negative findings   Constit  [x]  General appearance Eyes  [x]  Conj/lids []  Pupils  []  Fundi     HENMT  []  External ears/nose   []  Gross hearing []  Nasal mucosa   []  Lips/teeth/gums []  Oropharynx    []  Mucus membranes []  Head     Neck  []  Inspection/palpation []  Thyroid     Resp  [x]  Effort   [x]  Auscultation []  Crackles  []  Rhonchi  []  Wheezing   CV  [x]  Rhythm/rate   [x]  No murmur   []  No edema   []  JVP non-elevated    Normal pulses:   []  Radial []  Femoral  []  Pedal    Breast  []  Inspection []  Palpation     GI  [x]  No abd masses    [x]  No tenderness   [x]  No rebound/guarding   []  Liver/spleen []  Rectal     GU M: []  Scrotum []  Penis []  Prostate  F:  []  External []  Internal []  Urinary catheter  []  CVA tenderness  []  Suprapubic tenderness   Lymph  []  Cervical []  Supraclavicular []  Axillae []  Groin     MSK Specify site examined:    []  Inspect/palp []  ROM   []  Stability []  Strength/tone         Skin  []  Inspection []  Palpation   []  No rash    Neuro  []  CN2-12 intact grossly   []  Alert and oriented   []  DTR   []  Muscle strength   []  Sensation   []  Gait/balance     Psych  []  Insight/judgement   []  Mood/affect    []  Cognition        Laboratory Data (reviewed):   Recent Labs     04/23/22  0449 04/22/22  1941 04/21/22  2355   WBC 7.47 12.11* 12.79*   HGB 10.4* 9.9* 9.6*   HCT 33.7* 32.8* 31.2*   MCV 88.0 89.9 89.1   PLT 471* 472* 516*     Recent Labs     04/23/22  0449 04/22/22  1941 04/21/22  2351   NA 140 135 136   K 4.6 4.5 4.6   CL 103 102 100   CO2 25 22 23    BUN 7 8 10    CREAT 0.85 0.75 0.84   CALCIUM 9.6 9.2 9.9   MG 1.6  --   --    PHOS 3.9  --   --      estimated creatinine clearance is 63.1 mL/min (based on SCr of 0.85 mg/dL).    Recent Labs     04/23/22  0449 04/22/22  1941 04/22/22  0852 04/21/22  2351   TOTPRO 7.2 7.2  --  7.6   ALBUMIN 3.6* 3.6* 3.7* 3.7*   BILITOT 0.3 <0.2  --  0.3   BILICON  --  <0.2  --   --    ALT 15 16  --  11   AST 34 51  --  30   ALKPHOS 106 107  --  105   LIPASE  --   --   --  102*       Microbiology:   Reviewed    OSH:  4/21 body fluid bacterial E coli    Imaging Reviewed by Me:   CT Abdomen 5/3  1.  Status post cholecystectomy with similar degree of intrahepatic and extrahepatic biliary dilatation.   2.  Subtle stranding of the hepatorenal fossa/right anterior renal fascia with mild thickening of the hepatic flexure of the colon and second portions of duodenum. No fluid collection. Findings are nonspecific but may be posttreatment/inflammatory   changes secondary to recent subhepatic fluid collection and drainage per EMR.  3.  New small to moderate pericardial effusion.      CT ABdomen 4/19    CT Abdomen 4/12      Assessment:   Donna Gross is a 56 y.o. female with history of COPD, PUD c/b gastric outlet obstruction s/p multiple surgeries including vagotomy, hernia repair, colonic perforation s-p repair, gallstone pancreatitis s-p cholecystectomy admitted to OSH with abdominal pain found to have possible cystic duct stump leak c/b sub hepatic abscess s-p drainage by IR 4/21 with cultures growing ESBL E coli now transferred to [facility] for HLOC now s-p ERCP currently on ertapenem.    Problem List:     1. Sub hepatic abscess possibly due to cystic duct stump leak, now s-p IR drainage 4/21 with cultures growing ESBL E coli, now clinically improved, drain removed, no evidence of leak or abscess of ERCP or repeat CT of the abdomen. Plan to continue the ertapenem to complete total 14-day course dated from meropenem 4/23, through 5/6 and then stop.    - Isolation Precautions: None    Recommendations:     1. Continue ertapenem 1gm  IV q24h to complete 14-day course dated from initiation of meropenem (4/23-5/6)  2. Then follow clinically for any evidence of recurrent infection    Thank you for this consultation. We will sign off. Please page [phone] [facility] 2) with any questions.    Author:  Susy Frizzle. Cato Mulligan, MD, PhD 04/23/2022 10:44 AM

## 2022-04-23 NOTE — Other
Patient's Clinical Goal:   Clinical Goal(s) for the Shift: Patient will be hemodynamically stable; Safety and comfort; Pain control  Identify possible barriers to advancing the care plan: None  Stability of the patient: Moderately Stable- low risk of pt condition declining/worsening   Progression of Patient's Clinical Goal:    Problem/Goal:   Postop (Cholangiopancreatography w/ stent placement) complication; worsening abdominal pain; nausea    Response to Plan of Care and any changes in condition   ? AOx4; makes needs known  ? BMATx4 (ambulates and voids independently)  ? Diet advanced from NPO to clear liquid. Still reporting nausea, managed with prn IV Zofran.  ? Abdominal pain was managed with scheduled PO tylenol and PO methocarbamol and PRN IV dilaudid and PRN PO oxycodone.  ? mIVF D5LR @ 75 mL/hr    Interdisciplinary communication   ? None    Psychosocial communication   ? None    Plan and Disposition   ? Continue managing pain  ? Continue IV ertapenem q24 hrs    Preceptor Addendum:    I have seen and assessed the patient. I personally reviewed the patient's flowsheet documentation. I discussed the patient's care with student nurse Ozella Almond, reviewed her notes above and agreed with her assessments as documented.

## 2022-04-23 NOTE — Progress Notes
General Surgery Note  PATIENT: Donna Gross  MRN: [id number]  DOB: 06/29/1966  DATE OF SERVICE: 04/23/2022  REASON FOR CONSULT/ADMISSION: c/f cystic duct stump leak  CHIEF COMPLAINT:   Chief Complaint   Patient presents with   ? Abdominal Pain     PT walk in to ED from home co diffuse abd pain 10/10 wo N/V since 03/27/2022. Per PT got D'cd from [facility] this morning for persistent abd pain.  Hx: cholecystectomy on 03/27/22 in [place].      Subjective:   Donna Gross is a 56 y.o. female hx COPD, PUD c/b gastric outlet obstruction s/p multiples surgeries including vagotomy, ?hernia repair with Dr [name], colon perf s/p colostomy bag s/p reversal, gallstone pancreatitis who had a cholecystectomy here on 4/8 was admitted to [facility] on 4/12 for abdominal pain and imaging workup revealed a possible cystic duct stump leak.    Review of outside records:  CT A&P on 4/19 noted subhepatic fluid collection which was drained on 4/21. On 4/25 a tubogram was performed and contrast injected through the drain showed opacification in the duodenum. However the outside facility lacked ERCP capabilities and the patient was discharged and her drain was removed.    Currently the patient is complaining of abdominal pain    Surgical history: abdomnal ulcer removed, hernia repair with Dr [name], colono perf s/p colostomy bag s/p reversal   Social history :smokes 1 cigarette /day x 20 years. Denies etoh , illicit drug use. Lives with daughter, [name].?      INTERVAL EVENTS/SUBJECTIVE:  5/4: NAEON, AFVSS. ERCP without evidence of leak, stent placed. Overnight, slept well. Endorses improvement in pain. Denies any nausea. Denies passing gas, however. Has not ambulated.    Objective:     Last Recorded Vital Signs:    04/23/22 0300   BP: 130/84   Pulse: (!) 100   Resp: 16   Temp: 36.8 ?C (98.2 ?F)   SpO2: 100%    Body mass index is 22.55 kg/m?Marland Kitchen  Gen - Well developed NAD  HEENT - NC AT  CV - Regular rate and rhythm  Pulm - Normal respiratory effort  Abd - Soft, ND, minimally tender to palpation diffusely  Extrem - No edema, warm, well perfused  Skin - No rashes appreciated  LABS/MICRO:  Blood type: B Positive   Lab Results   Component Value Date    WBC 7.47 04/23/2022    HGB 10.4 (L) 04/23/2022    HCT 33.7 (L) 04/23/2022    MCV 88.0 04/23/2022    PLT 471 (H) 04/23/2022     Lab Results   Component Value Date    NA 140 04/23/2022    K 4.6 04/23/2022    BUN 7 04/23/2022    CREAT 0.85 04/23/2022    GLUCOSE 109 (H) 04/23/2022     Lab Results   Component Value Date    AST 34 04/23/2022    ALT 15 04/23/2022    ALKPHOS 106 04/23/2022    BILICON <0.2 04/22/2022    BILITOT 0.3 04/23/2022    ALBUMIN 3.6 (L) 04/23/2022    TOTPRO 7.2 04/23/2022    LDH 360 (H) 01/26/2020     Lab Results   Component Value Date    AMYLASE 93 12/19/2021    LIPASE 102 (H) 04/21/2022      Lab Results   Component Value Date    LACTATE 14 04/09/2021    COVID19QLPCR Not Detected 04/22/2022    TROPONIN <0.04 04/08/2021  PREGTESTBLD Negative 04/22/2022      IMAGING:  No imaging has been resulted in the last 24 hours   Assessment & Plan:   [name] is a 56 y.o. female s/p lap chole on 4/8 admitted to OSH and found to have a possible cystic duct stump leak with development of subhepatic abscess s/p IR drainage. Transferred to [facility] for ERCP 5/3 which showed no evidence of cystic duct stump leak, stent placed.     Notably, per OSH records, underwent CT-guided drain placement on 4/21 with cultures demonstrating MDR E-coli. ID was consulted and recommended 10 days meropenem until 4/30 but was switched to ertapenem and was continued up until transfer.     Subjectively, the patient states her pain is improved. She is AFVSS. WBC downtrending.    Plan  - Advance to [facility] today  - IV antibiotics for MDR E.coli in OSH, appreciate ID consult on guidance given limited records at OSH.  - Pain control with multimodal PO/IV pain regimen  - OOB  - DVT ppx with lovenox     Darrol Angel, MD PGY1  04/23/2022 7:27 AM

## 2022-04-23 NOTE — Progress Notes
[facility] DEPARTMENT OF MEDICINE  DIVISION OF GASTROENTEROLOGY  INTERVENTIONAL ENDOSCOPY SERVICE PROGRESS NOTE    PATIENT: Donna Gross  MRN: [id number]  DOB: March 27, 1966    ADMISSION DATE: 04/22/2022    HOSPITAL DAY: 1  DATE OF SERVICE: 04/23/2022   ATTENDING ON SERVICE (CONSULT REQUESTOR): Dr. Prentice Docker  ATTENDING ON IES TEAM: Dr. Jaclyn Prime  REASON FOR CONSULT: Bile leak    INITIAL HPI:  Donna Gross is a 56 y.o. female with perforated gastric ulcer s/p billroth 2 surgery, recent lap cholecystectomy 04/07, who presented to OSH 04/12 afterwards with worsening upper abdominal pain. Found to have fluid collection in GB fossa that was enlarging on serial imaging s/p IR-guided drain 04/21 (Cx with MDR E-Coli s/p one week of Meropenem till 04/30), tubulogram 04/25 revealed contrast going through bile ducts into the duodenum consistent with bile leak, drain removed 05/02 on discharge day from OSH who presents with persistent abdominal pain. GI consulted for ERCP with stent placement    Patient had chronic symptomatic cholelithiasis and prior obstructing choledocholithiasis that previously required ERCP with sphincterotomy 03/2021. She underwent a lap cholecystectomy here on 03/27/2022 which was challenging due to extensive adhesions from prior surgeries (billroth 2 for perforated gastric ulcer, and partial colectomy for colonic perforation). She did well post-operatively with pain control medications and tolerated PO before discharge on 03/31/2022. One day following her discharge, she presented to OSH for worsening RUQ abdominal pain and nausea. Course as below:    - Initial Labs were notable for mild leukocytosis and normal LFTs.  - RUQ [place] 04/12: GB fluid collection that is causing mild biliary dilation due to extrinsic compression.   - HIDA SCAN 04/13: No bile leak seen  - CT 04/19 for worsening pain and leukocytosis: Enlarging enhancing fluid collection in GB fossa concerning for abscess  - 04/21: CT-guided drain placed: CX with MDR E-Coli. ID consulted and recommended 10 days of Meropenem which was completed on 04/30  - Persistent dark drainage from the drain warranted tubulogram on 04/25: contrast went into the bile ducts then through the ampulla into the duodenum.  - GI consulted and recommended transfer for ERCP to outside hospital.  - Transfer logistics were challenging and patient was eventually discharged 05/02 with instructions to present here.  - Drain was removed yesterday, per patient minimal output over last 24hrs.    In ED here, HDS, Leukocytosis 12.7, stable Hg 9.6. LFTs unremarkable. CT without contrast (contrast leaked through IV) with intrahepatic and extrahepatic dilation and stranding in the RUQ without fluid collection.    INTERVAL Hx:  05/04: ERCP performed yesterday without evidence of bile leak. A plastic 36F double pigtail stent was placed. Patient reports mild improvement by 10-20% in her upper abdominal pain. HDS      MEDS  Scheduled:  acetaminophen, 650 mg, Oral, Q6H  enoxaparin, 40 mg, Subcutaneous, QHS  ertapenem IV, 1 g, Intravenous, Q24H  methocarbamol, 750 mg, Oral, TID  QUEtiapine, 100 mg, Oral, BID    PRN:  fluticasone-salmeterol, HYDROmorphone, ipratropium-albuterol, ondansetron injection/IVPB, oxyCODONE    Infusions:   dextrose 5%/lactated ringers 75 mL/hr (04/23/22 0246)          PHYSICAL EXAM    Temp:  [36.2 ?C (97.2 ?F)-36.9 ?C (98.4 ?F)] 36.9 ?C (98.4 ?F)  Heart Rate:  [67-100] 67  Resp:  [13-20] 16  BP: (88-150)/(63-98) 116/67  NBP Mean:  [72-110] 81  SpO2:  [93 %-100 %] 98 %  BMI Readings from Last 1 Encounters:   04/21/22 22.55  kg/m?     Gen No acute distress, comfortable, awake, answers questions appropriately  HEENT Anicteric sclera, oropharynx clear, moist mucus membranes  Neck     Trachea midline, good range of motion  CV Regular rhythm, no significant murmurs  Pulm  Clear to auscultation bilaterally, no wheezing, rhonchi, crackles  Abd Normoactive bowel sounds, soft, TTP in RUQ and epigastric area, nondistended, no rebound or guarding  Ext No peripheral edema, cyanosis or clubbing  Neuro Alert and oriented x3, no focal neurologic deficits  Skin Appropriately warm, no gross skin excoriations, no rash or eczema  Psych    Normal affect, no depression or anxiety      LABS/STUDIES    Labs Independently Interpreted by me today  Lab Results   Component Value Date    WBC 7.47 04/23/2022    HGB 10.4 (L) 04/23/2022    HCT 33.7 (L) 04/23/2022    MCV 88.0 04/23/2022    PLT 471 (H) 04/23/2022     WBC:   Lab Results   Component Value Date/Time    WBC 7.47 04/23/2022 04:49 AM    WBC 8.7 03/09/2021 12:00 AM      INR   Lab Results   Component Value Date/Time    INR 1.0 04/08/2021 10:20 PM    INR 0.90 03/09/2021 12:00 AM       Sodium:   Lab Results   Component Value Date/Time    NA 140 04/23/2022 04:49 AM    NA 140 03/09/2021 12:00 AM      Creatinine:   Lab Results   Component Value Date/Time    CREAT 0.85 04/23/2022 04:49 AM    CREAT 1.0 03/09/2021 12:00 AM      BUN:   Lab Results   Component Value Date/Time    BUN 7 04/23/2022 04:49 AM    BUN 22 03/09/2021 12:00 AM      Potassium:   Lab Results   Component Value Date/Time    K 4.6 04/23/2022 04:49 AM    K 4.9 03/09/2021 12:00 AM      AST, ALT, and Alk Phos Trend   Lab Results   Component Value Date/Time    AST 34 04/23/2022 04:49 AM    AST 51 04/22/2022 07:41 PM    AST 30 04/21/2022 11:51 PM    AST 43 03/09/2021 12:00 AM    ALT 15 04/23/2022 04:49 AM    ALT 16 04/22/2022 07:41 PM    ALT 11 04/21/2022 11:51 PM    ALT 21 03/09/2021 12:00 AM    ALKPHOS 106 04/23/2022 04:49 AM    ALKPHOS 107 04/22/2022 07:41 PM    ALKPHOS 105 04/21/2022 11:51 PM    ALKPHOS 97 03/09/2021 12:00 AM      Bilirubin Trend:   Lab Results   Component Value Date/Time    BILITOT 0.3 04/23/2022 04:49 AM    BILITOT <0.2 04/22/2022 07:41 PM    BILITOT 0.3 04/21/2022 11:51 PM    BILITOT 0.3 02/15/2022 04:54 AM      Conjugated Bilirubin:   Lab Results   Component Value Date/Time    BILICON <0.2 04/22/2022 07:41 PM        Imaging Tests Independently Interpreted by me Today   Last CT a/p: Results for orders placed during the hospital encounter of 04/21/22    CT abd+pelvis w contrast 04/22/2022 (Final)    Impression  This is essentially a noncontrast enhanced study due to contrast extravasation as described above.  1.  Status post cholecystectomy with similar degree of intrahepatic and extrahepatic biliary dilatation.  2.  Subtle stranding of the hepatorenal fossa/right anterior renal fascia with mild thickening of the hepatic flexure of the colon and second portions of duodenum. No fluid collection. Findings are nonspecific but may be posttreatment/inflammatory  changes secondary to recent subhepatic fluid collection and drainage per EMR.  3.  New small to moderate pericardial effusion.    I, Lou Cal, M.D., have reviewed this radiological study personally and I am in full agreement with the findings of the report presented here.    Dictated by: Kirk Ruths   04/22/2022 6:39 AM    Signed by: Lou Cal   04/22/2022 8:09 AM     Procedures  ERCP 04/22/2022  1. No obvious bile leak seen. But given clinical concern for bile leak following recent cholecystectomy, a 7 Fr biliary stent was placed.  2. Anatomy consistent with Billroth 2.     I reviewed these specialist notes that directly relate to the patient's acute and/or chronic medical problems with documentation of the salient findings, if ION:GEXBM Brendolyn Patty, MD in Emergency Medicine on 04/22/2022.  Fransisco Beau, MD, MPH in Medicine, Gastroenterology on 03/11/2021.  MARK J. Fredric Mare, MD in Surgery, General on 04/21/2022.    I have spoken with the following physicians/teams regarding the patient's care today:  Discussed with primary team regarding plan. Our conclusions were as below    ASSESSMENT & PLAN:  ====================  Sherrika Weakland is a 56 y.o. female with perforated gastric ulcer s/p billroth 2 surgery, recent lap cholecystectomy 04/07, who presented to OSH 04/12 afterwards with worsening upper abdominal pain. Found to have fluid collection in GB fossa that was enlarging on serial imaging s/p IR-guided drain 04/21 (Cx with MDR E-Coli s/p one week of Meropenem till 04/30), tubulogram 04/25 revealed contrast going through bile ducts into the duodenum consistent with bile leak, drain removed 05/02 on discharge day from OSH who presents with persistent abdominal pain. GI consulted for ERCP with stent placement    # Concern for Cystic Duct Bile Leak s/p CBD stent placement  # Billroth 2 Anatomy  # Recent Cholecystectomy 03/27/2022  # Mild Intrahepatic & Extrahepatic biliary dilation  - Bile leak confirmed per GB fossa drain tubulogram at the OSH on 04/14/2022 as contrast went through the bile ducts into the duodenum.  - Had transient drain placed at outside hospital 04/21-05/02.  - CT on admission without contrast did not reveal any evidence of residual fluid collection.  - Per discussion with surgical colleagues, ERCP with CBD stent placed 05/03. Tolerated procedure well with mild improvement in her pain  - Recommendations:   --> Consider cross sectional imaging with contrast to evaluate for any residual fluid collection from the suspected recent bile leak.   --> Repeat ERCP in 2-3 months for stent removal/exchange   --> Diet as tolerated & empiric antibiotics per primary team         Author:    Bayard Males 04/23/2022 9:30 AM  GI Fellow, pager [phone]  If after 5pm or on weekends, please page GI fellow on call    Discussed with attending on service, Dr. Jaclyn Prime. IES team is signing off    I, Dr. Jaclyn Prime, have seen and examined the patient and agree with the summary and plan as detailed above in the note, assessment, and plan, unless noted otherwise and documented below.  Positive review of systems as stated in the fellow?s note, otherwise all other systems  were reviewed and are negative as pertinent to this consultation. PMH, FH and, SH have been reviewed and are stated when pertinent, otherwise they are non-contributory to the medical care of this patient. The fellow's note was edited to reflect my findings and recommendations.  Please see fellow?s note for additional details.

## 2022-04-23 NOTE — Interdisciplinary
BOOST (Better Outcomes by Optimizing Safe Transitions)        BOOST Elements Best Practice / Interventions Status   RN   P1 # Problems with Medications   Teach back COMPLETED for High risk medications:   []  Insulin   [x]  Anticoagulants: ____  [x]  Opioids:____  []  Immunosuppressive drugs: _______  []  Pending      Teach back on PTA meds and new meds ordered while inpatient []  Completed  [x]  Pending  []  Needs reinforcement on (medication name/s): ________      Seen by DM educator? [x]  N/A  []  Yes  []  No   []  Pending   P2 # Psychological [place] Suicide Risk Assessment   No risk      Seen by Child psychotherapist?   [x]  N/A    []  Yes   []  No, awaiting for SW   Reason for consult: _____     P3 # Principal Diagnosis Review with patient/caregiver disease-specific education and signs & symptoms to watch for. []  Completed  [x]  Ongoing  []  Needs reinforcement    Identify markers for care needs upon discharge.  [x]  N/A   []  DME   []  O2 needs   []  presence of foley           []  Other:___      Notify MD of need/s.  []  N/A []  Completed   []  Other:_____          Education provided for: [x]  N/A         []  Drain: _____    []  Enteral feeding    []  Foley catheter  []  Leg bag    []  PICC      []  PortACath   []  Oxygen  []   PleurX    []  Wound Vac  []  Other:____   P4 # Physical Limitations PT consult ordered by MD    []  N/A  []  Yes  [x]  No Evaluated by PT    [x]  N/A  []  Yes  []  Pending  PT/OT recs:    ,        SLP consult ordered by MD  []  N/A  []  Yes  [x]  No  Evaluated by OT   [x]  N/A  []  Yes  []  Pending      Discharge DME ordered by MD    [x]  N/A    []  FWW       []  Portable oxygen           []  Other:___     []  Delivered to bedside    []  Delivered to home  []  Pending delivery    Transportation for discharge  []  Family/friend    []  Taxi/Uber   []  Ambulance      []  Notified CM of transport need  [x]  Pending confirmation   []  Set up and confirmed     P5 # Health Literacy Primary learner:   [x]  Patient   []  Other: Name: __________  Relationship: __________  Contact number: ________ Discharge support  []  Spouse:______  []  Family:_______  []  Caregiver: __________     P6 # Patient Support Identified primary caregiver:   Name: __________  Relationship: __________  Contact number: ________   SW consult requested for:  []  N/A     []  Homelessness  []  Caregiver assistance  []  Other     P7 # Prior Hospitalizations Patient was hospitalized within the last 6 months?   []  N/A [x]  Yes []  No  Reason for current hospitalization:   []  Missed dialysis  []  Missed MD appointment  []  Medication related   [x]  Other:_post op complication___   Check Discharge Instructions for follow-up appointment with medical provider within 7 days of hospital discharge.     Review with patient/caregiver what to do and who to contact in the event of worsening or new symptoms (signs and symptoms to watch for, when to call MD, what number to call, when to call 911).     P8 # Palliative Care 1. Does the patient have uncontrolled symptoms related to their life-limiting illness?  [x]  N/A []  Yes []  No    2.   Is the patient in need of goals of care discussion?  [x]  N/A []  Yes []  No        Does the patient have Advance Directive?  [x]  Yes []  No  If no, request for SW consult.  []  Request placed   If 'YES' to both 1 and 2 - Recommend Palliative Care consult. []  Done    If 'NO' to both 1 and 2 - Continue to monitor for changes    If 'YES' to 1 and 'NO'  to 2 - Recommend appropriate avenue for symptom control.    If 'NO' to 1 and 'YES' to 2 - Encourage primary team to have Goals of Care conversation with the patient.

## 2022-04-23 NOTE — Other
Patient's Clinical Goal:   Clinical Goal(s) for the Shift: VSS, pain control, IVF, NPO x meds, ERCP, comfort  Identify possible barriers to advancing the care plan: none  Stability of the patient: Moderately Stable - low risk of patient condition declining or worsening   Progression of Patient's Clinical Goal:     Problem/Goal:   Worsening abdominal pain    Response to Plan of Care and any changes in condition (clinical condition, any concerning assessments, events, interventions; response to POC effectiveness or changes made):    VSS, alert and oriented x4, currently sleeping   BMAT 4 at baseline   Patient arrived from ER/procedures this afternoon, patient had ERCP with stent performed   Patient to be NPO for meantime, can have sips of water with medication    Interdisciplinary communication (team member communication):    Patient seen at bedside on unit by Dr. Verta Ellen, who assessed abdomen and briefly spoke with patient     Psychosocial communication (family or patient issues potentially affecting care):    none    Plan and Disposition (progression toward specific goals of care/hospitalization and discharge):   Pain management   IV antibiotics   Activity as tolerated

## 2022-04-24 LAB — Comprehensive Metabolic Panel: CHLORIDE: 106 mmol/L (ref 96–106)

## 2022-04-24 LAB — Phosphorus: PHOSPHORUS: 3.4 mg/dL (ref 2.3–4.4)

## 2022-04-24 LAB — Magnesium: MAGNESIUM: 1.2 meq/L — ABNORMAL LOW (ref 1.4–1.9)

## 2022-04-24 LAB — CBC: MEAN CORPUSCULAR VOLUME: 91 fL (ref 79.3–98.6)

## 2022-04-24 MED ADMIN — OXYCODONE-ACETAMINOPHEN 5-325 MG PO TABS: 2 | ORAL | @ 20:00:00 | Stop: 2022-04-28 | NDC 00406051262

## 2022-04-24 MED ADMIN — ACETAMINOPHEN 325 MG PO TABS: 650 mg | ORAL | @ 01:00:00 | Stop: 2022-04-24 | NDC 00904677361

## 2022-04-24 MED ADMIN — ERTAPENEM IVPB: 1 g | INTRAVENOUS | @ 01:00:00 | Stop: 2022-04-25 | NDC 55150028220

## 2022-04-24 MED ADMIN — OXYCODONE HCL 5 MG PO TABS: 5 mg | ORAL | @ 10:00:00 | Stop: 2022-04-24 | NDC 00406055262

## 2022-04-24 MED ADMIN — OXYCODONE HCL 5 MG PO TABS: 5 mg | ORAL | @ 03:00:00 | Stop: 2022-04-24 | NDC 00406055262

## 2022-04-24 MED ADMIN — METHOCARBAMOL 500 MG PO TABS: 1000 mg | ORAL | @ 19:00:00 | Stop: 2022-04-28 | NDC 60687055911

## 2022-04-24 MED ADMIN — OXYCODONE-ACETAMINOPHEN 5-325 MG PO TABS: 2 | ORAL | @ 15:00:00 | Stop: 2022-04-28 | NDC 00406051262

## 2022-04-24 MED ADMIN — ONDANSETRON HCL 4 MG/2ML IJ SOLN: 4 mg | INTRAVENOUS | @ 03:00:00 | Stop: 2022-04-28 | NDC 60505613000

## 2022-04-24 MED ADMIN — DEXTROSE 5 %/LACTATED RINGERS: 50 mL/h | INTRAVENOUS | @ 01:00:00 | Stop: 2022-05-22 | NDC 00338012504

## 2022-04-24 MED ADMIN — METHOCARBAMOL 750 MG PO TABS: 750 mg | ORAL | @ 14:00:00 | Stop: 2022-04-24 | NDC 60687056811

## 2022-04-24 MED ADMIN — QUETIAPINE FUMARATE 100 MG PO TABS: 100 mg | ORAL | @ 03:00:00 | Stop: 2022-04-28 | NDC 60687034911

## 2022-04-24 MED ADMIN — ONDANSETRON HCL 4 MG/2ML IJ SOLN: 4 mg | INTRAVENOUS | @ 10:00:00 | Stop: 2022-04-28 | NDC 60505613000

## 2022-04-24 MED ADMIN — ACETAMINOPHEN 325 MG PO TABS: 650 mg | ORAL | @ 14:00:00 | Stop: 2022-04-24 | NDC 00904677361

## 2022-04-24 MED ADMIN — ACETAMINOPHEN 325 MG PO TABS: 650 mg | ORAL | @ 07:00:00 | Stop: 2022-04-24 | NDC 00904677361

## 2022-04-24 MED ADMIN — HYDROMORPHONE HCL 1 MG/ML IJ SOLN: .2 mg | INTRAVENOUS | @ 10:00:00 | Stop: 2022-04-29 | NDC 00409128331

## 2022-04-24 MED ADMIN — ONDANSETRON HCL 4 MG/2ML IJ SOLN: 4 mg | INTRAVENOUS | @ 19:00:00 | Stop: 2022-04-28 | NDC 60505613000

## 2022-04-24 MED ADMIN — HYDROMORPHONE HCL 1 MG/ML IJ SOLN: .2 mg | INTRAVENOUS | @ 17:00:00 | Stop: 2022-04-26 | NDC 00409128331

## 2022-04-24 MED ADMIN — ENOXAPARIN SODIUM 40 MG/0.4ML IJ SOSY: 40 mg | SUBCUTANEOUS | @ 03:00:00 | Stop: 2022-04-28

## 2022-04-24 MED ADMIN — HYDROMORPHONE HCL 1 MG/ML IJ SOLN: .2 mg | INTRAVENOUS | @ 04:00:00 | Stop: 2022-04-26 | NDC 00409128331

## 2022-04-24 MED ADMIN — DEXTROSE 5 %/LACTATED RINGERS: 50 mL/h | INTRAVENOUS | @ 15:00:00 | Stop: 2022-04-28 | NDC 00338012504

## 2022-04-24 MED ADMIN — OXYCODONE-ACETAMINOPHEN 5-325 MG PO TABS: 2 | ORAL | @ 15:00:00 | Stop: 2022-04-28

## 2022-04-24 MED ADMIN — METHOCARBAMOL 750 MG PO TABS: 750 mg | ORAL | @ 03:00:00 | Stop: 2022-04-24 | NDC 60687056811

## 2022-04-24 MED ADMIN — QUETIAPINE FUMARATE 100 MG PO TABS: 100 mg | ORAL | @ 15:00:00 | Stop: 2022-04-28 | NDC 50268063211

## 2022-04-24 MED ADMIN — HYDROMORPHONE HCL 1 MG/ML IJ SOLN: .2 mg | INTRAVENOUS | @ 23:00:00 | Stop: 2022-04-29 | NDC 00409128331

## 2022-04-24 NOTE — Other
Patient's Clinical Goal:   Clinical Goal(s) for the Shift: Patient will be hemodynamically stable; Safety and comfort; Pain control  Identify possible barriers to advancing the care plan:   Stability of the patient: Moderately Unstable - medium risk of patient condition declining or worsening    Progression of Patient's Clinical Goal:     Hx:  COPD, PUD c/b gastric outlet obstruction s/p multiples surgeries including vagotomy, hernia repair, colon perf s/p colostomy bag s/p reversal, gallstone pancreatitis    Dx: Post op complication, abdominal pain    cholecystectomy here on 4/8 was admitted to [facility] on 4/12 for abdominal pain and imaging workup revealed a possible cystic duct stump leak.     Response to Plan of Care and any changes in condition (clinical condition, any concerning assessments, events, interventions; response to POC effectiveness or changes made):    Post op, to 4 MN just prior to 5/3 night shift  ENDOSCOPIC RETROGRADE CHOLANGIOPANCREATOGRAPHY W/ STENT PLACEMENT    A&Ox4, non-monitored, SPo2 >98% 2L NC    Pain (abdominal region) controlled with scheduled tylenol, methocarbamol, PRN IV dilaudid Q6    Upgraded to clear liquid diet, asked for zofran x2     IV access upper left arm 20G continuous gtt D5 LR @75mL /hr (left hand 20G as well, not preferred by patient)    BMAT 4, voiding via restroom    Paged Dr. for trazadone 100mg  for sleep per patient, no order or response.  Patient awake most of night     Interdisciplinary communication (team member communication):    None?    Psychosocial communication (family or patient issues potentially affecting care):    None    Plan and Disposition (progression toward specific goals of care/hospitalization and discharge):     Pain management   IV antibiotics   Activity as tolerated    Update BOOST

## 2022-04-24 NOTE — Progress Notes
General Surgery Note  PATIENT: Donna Gross  MRN: [id number]  DOB: Sep 26, 1966  DATE OF SERVICE: 04/24/2022  REASON FOR CONSULT/ADMISSION: c/f cystic duct stump leak  CHIEF COMPLAINT:   Chief Complaint   Patient presents with   ? Abdominal Pain     PT walk in to ED from home co diffuse abd pain 10/10 wo N/V since 03/27/2022. Per PT got D'cd from [facility] this morning for persistent abd pain.  Hx: cholecystectomy on 03/27/22 in [place].      Subjective:   Dreana Britz is a 56 y.o. female hx COPD, PUD c/b gastric outlet obstruction s/p multiples surgeries including vagotomy, ?hernia repair with Dr [name], colon perf s/p colostomy bag s/p reversal, gallstone pancreatitis who had a cholecystectomy here on 4/8 was admitted to [facility] on 4/12 for abdominal pain and imaging workup revealed a possible cystic duct stump leak.    Review of outside records:  CT A&P on 4/19 noted subhepatic fluid collection which was drained on 4/21. On 4/25 a tubogram was performed and contrast injected through the drain showed opacification in the duodenum. However the outside facility lacked ERCP capabilities and the patient was discharged and her drain was removed.    Currently the patient is complaining of abdominal pain    Surgical history: abdomnal ulcer removed, hernia repair with Dr [name], colono perf s/p colostomy bag s/p reversal   Social history :smokes 1 cigarette /day x 20 years. Denies etoh , illicit drug use. Lives with daughter, [name].?      INTERVAL EVENTS/SUBJECTIVE:  5/4: NAEON, AFVSS. ERCP without evidence of leak, stent placed. Overnight, slept well. Endorses improvement in pain. Denies any nausea. Denies passing gas, however. Has not ambulated.    5/5: NAEON, AFVSS. Patient complaining of 3x diarrheal episodes today. Endorses persistent pain on current regimen, prefers percocet. PO 118cc, encouraged to ingest more PO. Agreeable to advancing from clear diet. WBC stable at 7.51.    Objective:     Last Recorded Vital Signs:    04/24/22 0751   BP: 119/72   Pulse: 62   Resp: 17   Temp: 36.9 ?C (98.4 ?F)   SpO2: 98%    Body mass index is 22.55 kg/m?Marland Kitchen  Gen - Well developed NAD  HEENT - NC AT  CV - Regular rate and rhythm  Pulm - Normal respiratory effort  Abd - Soft, ND, minimally tender to palpation diffusely  Extrem - No edema, warm, well perfused  Skin - No rashes appreciated  LABS/MICRO:  Blood type: B Positive   Lab Results   Component Value Date    WBC 7.51 04/24/2022    HGB 9.3 (L) 04/24/2022    HCT 31.3 (L) 04/24/2022    MCV 91.0 04/24/2022    PLT 452 (H) 04/24/2022     Lab Results   Component Value Date    NA 140 04/24/2022    K 3.7 04/24/2022    BUN 3 (L) 04/24/2022    CREAT 0.79 04/24/2022    GLUCOSE 89 04/24/2022     Lab Results   Component Value Date    AST 24 04/24/2022    ALT 9 04/24/2022    ALKPHOS 91 04/24/2022    BILICON <0.2 04/22/2022    BILITOT 0.2 04/24/2022    ALBUMIN 3.2 (L) 04/24/2022    TOTPRO 6.4 04/24/2022    LDH 360 (H) 01/26/2020     Lab Results   Component Value Date    AMYLASE 93 12/19/2021  LIPASE 102 (H) 04/21/2022      Lab Results   Component Value Date    LACTATE 14 04/09/2021    COVID19QLPCR Not Detected 04/22/2022    TROPONIN <0.04 04/08/2021    PREGTESTBLD Negative 04/22/2022      IMAGING:  No imaging has been resulted in the last 24 hours   Assessment & Plan:   Tamanna Whitson is a 56 y.o. female s/p lap chole on 4/8 admitted to OSH and found to have a possible cystic duct stump leak with development of subhepatic abscess s/p IR drainage. Transferred to [facility] for ERCP 5/3 which showed no evidence of cystic duct stump leak, stent placed.     Notably, per OSH records, underwent CT-guided drain placement on 4/21 with cultures demonstrating MDR E-coli. ID was consulted and recommended 10 days meropenem until 4/30 but was switched to ertapenem and was continued up until transfer.     5/5: WBC stable today. Pain persistent as prior. ID recs for continuing IV ertapenem until 5/6 for 14 day course.    Plan  - Advance to soft diet today, Ensures  - Follow-up stool studies for C. Diff for episodes of diarrhea  - Per ID, continue ertapenem IV until 5/6 for 14 day course started at OSH  - Pain control with multimodal PO/IV pain regimen       - Increase robaxin 1000 mg TID, Percocet 5/10 ss  - OOB  - DVT ppx with lovenox     Darrol Angel, MD PGY1  04/24/2022 9:02 AM

## 2022-04-24 NOTE — Other
Patient's Clinical Goal:   Clinical Goal(s) for the Shift: Patient will be hemodynamically stable; VSS; Manage pain and nausea  Identify possible barriers to advancing the care plan: None  Stability of the patient: Moderately Stable - low risk of patient condition declining or worsening   Progression of Patient's Clinical Goal:     Problem/Goal: Post op complication from surgery cholangiopancreatography w/ stent placement. Manage pain and nausea.    Response to Plan of Care and any changes in condition:    Alert and oriented x 4.   BMAT 4. Independent and steady.   Poor appetite. Declined breakfast and lunch but ordered dinner, will try to eat.   mIVF D5LR decreased to 50 ml/hr.   IV ertapenem q 24 hrs.   Abdominal pain was managed with scheduled po methocarbamol, prn po percocet and iv dilaudid.   IV zofran x 1 given this shift.   Reported 5 BM this shift.    Interdisciplinary communication:    Contact spore precaution pending result of stool cdiff.    Psychosocial communication:        Plan and Disposition:   Continue iv ertapenem til 5/6 to complete 14 day course.   Follow up stool cdiff result.   Continue to manage pain and nausea.   Monitor improvement in appetite.

## 2022-04-25 ENCOUNTER — Ambulatory Visit: Payer: PRIVATE HEALTH INSURANCE

## 2022-04-25 LAB — C difficile PCR: C DIFFICILE PCR: NEGATIVE

## 2022-04-25 LAB — Comprehensive Metabolic Panel
ALBUMIN: 3.2 g/dL — ABNORMAL LOW (ref 3.9–5.0)
TOTAL CO2: 24 mmol/L (ref 20–30)

## 2022-04-25 LAB — Magnesium: MAGNESIUM: 1.3 meq/L — ABNORMAL LOW (ref 1.4–1.9)

## 2022-04-25 LAB — Phosphorus: PHOSPHORUS: 4.1 mg/dL (ref 2.3–4.4)

## 2022-04-25 LAB — CBC: MEAN CORPUSCULAR VOLUME: 89.9 fL (ref 79.3–98.6)

## 2022-04-25 MED ADMIN — SODIUM CHLORIDE 0.9% IV SOLN (250 ML): 10 mL/h | INTRAVENOUS | @ 17:00:00 | Stop: 2022-04-28 | NDC 00338004902

## 2022-04-25 MED ADMIN — OXYCODONE-ACETAMINOPHEN 5-325 MG PO TABS: 2 | ORAL | @ 01:00:00 | Stop: 2022-04-28 | NDC 00406051262

## 2022-04-25 MED ADMIN — QUETIAPINE FUMARATE 100 MG PO TABS: 100 mg | ORAL | @ 04:00:00 | Stop: 2022-04-28 | NDC 50268063211

## 2022-04-25 MED ADMIN — OXYCODONE-ACETAMINOPHEN 5-325 MG PO TABS: 2 | ORAL | @ 11:00:00 | Stop: 2022-04-28 | NDC 00406051262

## 2022-04-25 MED ADMIN — OXYCODONE-ACETAMINOPHEN 5-325 MG PO TABS: 2 | ORAL | @ 17:00:00 | Stop: 2022-04-28 | NDC 00406051262

## 2022-04-25 MED ADMIN — HYDROMORPHONE HCL 1 MG/ML IJ SOLN: .2 mg | INTRAVENOUS | @ 05:00:00 | Stop: 2022-04-26 | NDC 00409128331

## 2022-04-25 MED ADMIN — MAGNESIUM SULFATE 4 GM/100ML IV SOLN: 4 g | INTRAVENOUS | @ 17:00:00 | Stop: 2022-04-25 | NDC 00409672923

## 2022-04-25 MED ADMIN — METHOCARBAMOL 500 MG PO TABS: 1000 mg | ORAL | @ 13:00:00 | Stop: 2022-04-28 | NDC 60687055911

## 2022-04-25 MED ADMIN — ONDANSETRON HCL 4 MG/2ML IJ SOLN: 4 mg | INTRAVENOUS | @ 05:00:00 | Stop: 2022-04-28 | NDC 60505613000

## 2022-04-25 MED ADMIN — ERTAPENEM IVPB: 1 g | INTRAVENOUS | @ 01:00:00 | Stop: 2022-04-25 | NDC 55150028220

## 2022-04-25 MED ADMIN — HYDROMORPHONE HCL 1 MG/ML IJ SOLN: .2 mg | INTRAVENOUS | @ 13:00:00 | Stop: 2022-04-26 | NDC 00409128331

## 2022-04-25 MED ADMIN — QUETIAPINE FUMARATE 100 MG PO TABS: 100 mg | ORAL | @ 17:00:00 | Stop: 2022-04-28 | NDC 50268063211

## 2022-04-25 MED ADMIN — METHOCARBAMOL 500 MG PO TABS: 1000 mg | ORAL | @ 21:00:00 | Stop: 2022-04-28 | NDC 60687055911

## 2022-04-25 MED ADMIN — METHOCARBAMOL 500 MG PO TABS: 1000 mg | ORAL | @ 04:00:00 | Stop: 2022-05-22 | NDC 60687055911

## 2022-04-25 MED ADMIN — DEXTROSE 5 %/LACTATED RINGERS: 50 mL/h | INTRAVENOUS | @ 17:00:00 | Stop: 2022-04-28 | NDC 00338012504

## 2022-04-25 MED ADMIN — ONDANSETRON HCL 4 MG/2ML IJ SOLN: 4 mg | INTRAVENOUS | @ 13:00:00 | Stop: 2022-05-22 | NDC 60505613000

## 2022-04-25 MED ADMIN — ENOXAPARIN SODIUM 40 MG/0.4ML IJ SOSY: 40 mg | SUBCUTANEOUS | @ 04:00:00 | Stop: 2022-04-30

## 2022-04-25 MED ADMIN — HYDROMORPHONE HCL 1 MG/ML IJ SOLN: .2 mg | INTRAVENOUS | @ 21:00:00 | Stop: 2022-04-26 | NDC 00409128331

## 2022-04-25 NOTE — Other
Patient's Clinical Goal:   Clinical Goal(s) for the Shift: Pain management, normalize GI function, safety,  Identify possible barriers to advancing the care plan: None  Stability of the patient: Moderately Stable - low risk of patient condition declining or worsening   Progression of Patient's Clinical Goal:     Problem/Goal:   Postoperative complication/ Pain management    Response to Plan of Care and any changes in condition (clinical condition, any concerning assessments, events, interventions; response to POC effectiveness or changes made):    Pain addressed with percocet 2 tabs po, methocarbamol po,  Dilaudid 0.2 mg IV,       Interdisciplinary communication (team member communication):   None  Psychosocial communication (family or patient issues potentially affecting care):    None    Plan and Disposition (progression toward specific goals of care/hospitalization and discharge):   Continue to address infection   Continue pain management   DC home when stable

## 2022-04-25 NOTE — Consults
NUTRITION ASSESSMENT (Adult)    Admit Date: 04/22/2022     Date of Birth: 09-01-1966 Gender: female MRN: [id number]     Date of Assessment: 04/25/2022   Status: Assessment   Indication: Weight loss > 5% in 1 month   Subjective: Seen in , appears weak and sleepy. Patient reported poor appetite, says she just nibbles at her food. Has been having abdominal pain so eats a little at a time, tolerating some. Lunch meal seen 10% or less consumed. Boost Breeze seen on bedside table, has not been available past few days, first time received, patient said will try today. Diet was recently advanced to easy to chew yesterday, previously on CLD. Patient endorsed poor appetite/intake for the past few weeks, but also says has been ongoing for a lot longer, pt unsure how long, says ''a long time.'' Currently on IVF. Patient had diarrhea yesterday, no BM yet today, C. Diff negative. UBW 154 lb (last weighed this 2 weeks ago per pt). Currently 131 lb - 23 lb weight loss (~15% severe change). Declined NFPE at this time.   Problems: Principal Problem:    Postoperative complication POA: Yes  Active Problems:    Abdominal pain POA: Yes       Nieve Rojero is a 56 y.o. female hx COPD, PUD c/b gastric outlet obstruction s/p multiples surgeries including vagotomy, ?hernia repair with Dr [name], colon perf s/p colostomy bag s/p reversal, gallstone pancreatitis who had a cholecystectomy here on 4/8 was admitted to [facility] on 4/12 for abdominal pain and imaging workup revealed a possible cystic duct stump leak.    Review of outside records:  CT A&P on 4/19 noted subhepatic fluid collection which was drained on 4/21. On 4/25 a tubogram was performed and contrast injected through the drain showed opacification in the duodenum. However the outside facility lacked ERCP capabilities and the patient was discharged and her drain was removed.  ?  Currently the patient is complaining of abdominal pain  ?  Surgical history: abdomnal ulcer removed, hernia repair with Dr [name], colono perf s/p colostomy bag s/p reversal   Social history :smokes 1 cigarette /day x 20 years. Denies etoh , illicit drug use. Lives with daughter, [name].?    Past Medical History:   Diagnosis Date   ? Anxiety    ? Depression    ? Emphysema, unspecified (HCC/RAF)    ? Emphysema, unspecified (HCC/RAF)    ? Fibromyalgia    ? Gastritis    ? GERD (gastroesophageal reflux disease)    ? Pancreatitis     Past Surgical History:   Procedure Laterality Date   ? ABDOMINAL SURGERY     ? COLON SURGERY             Data   Intake/Outputs: I/O last 2 completed shifts:  In: 150 [Other:150]  Out: -     Pertinent Medications:   Scheduled Meds:  ? enoxaparin  40 mg Subcutaneous QHS   ? ertapenem IV  1 g Intravenous Q24H   ? methocarbamol  1,000 mg Oral TID   ? QUEtiapine  100 mg Oral BID     Continuous Infusions:  ? dextrose 5%/lactated ringers 50 mL/hr (04/25/22 0953)   ? sodium chloride 10 mL/hr (04/25/22 0949)     PRN Meds:.fluticasone-salmeterol, HYDROmorphone, ipratropium-albuterol, ondansetron injection/IVPB, oxyCODONE, oxyCODONE-acetaminophen **OR** oxyCODONE-acetaminophen      FDI Target Drugs: No      Pertinent Labs:   Recent Labs     04/25/22  0351  NA 137   K 3.9   BUN 3*   CREAT 0.80   GLUCOSE 75   MG 1.3*   CALCIUM 9.1   PHOS 4.1   ALBUMIN 3.2*   HGB 9.9*   HCT 32.9*   ALKPHOS 98     Accu-Chek: No results found in last 72 hours    Trended Labs     04/22/22  0852   CRP 0.5      No results for input(s): VITD25OH, PTHINT, VITAMINA, VITAMINE, VITAMINC, VITAMINK, VITAMINB1, VITAMINB2, VITAMINB3, VITAMINB6, VITAMINB12, MEMALSER, FOLATE, FOLATERBC, FE, TIBC, FEBINDSAT, FERRITIN, SELENIUM, CUSER, CURBC, ZINCSER in the last 8760 hours.    Respiratory Status / O2 Device: Nasal cannula    Pressure Injury:     Patient Lines/Drains/Airways Status     Active Pressure Ulcers     None                Additional data:  none     Diet Info   ? Allergies:   Hydrocodone, Ibuprofen, and Morphine  ? Cultural/Ethnic/Religious/Other Food Preferences:  None     ? Nutrition prior to admit:  Regular, poor intake, patient unsure how many meals she eats per day.  ? Current diet order:     Diets/Supplements/Feeds   Diet    Diet easy to chew     Start Date/Time: 04/24/22 0740      Number of Occurrences: Until Specified   Nourishments    Oral nutrition supplements Breakfast, Lunch, Dinner; Boost Breeze [place], Boost [employer]     Start Date/Time: 04/23/22 1330      Number of Occurrences: Until Specified     Order Questions:     ? Meal: Breakfast     ? Meal: Lunch     ? Meal: Dinner     ? Supplement: Boost Breeze [place]     ? Supplement: Boost Breeze [employer]     ? PO % consumed: 0 to 25%   % Meal Eaten (last 7 days)  Date/Time Percent of Meal Eaten (%) Who   04/24/22 1400 0 FP   04/24/22 1000 5-25 FP   04/23/22 1633 51-75 JA   04/23/22 1204 51-75 FP     ? Parenteral Nutrition: n/a  ? Enteral Nutrition: n/a  ? Other caloric sources: D5/LR @ 50 ml/hr = 204 kcal/day    Anthropometrics:  Height: 162.6 cm (5' 4'')  Admit Weight: 59.6 kg (131 lb 6.3 oz) (04/21/22 2237) Last 5 recorded weights:  Weights 01/30/2022 02/13/2022 02/13/2022 03/27/2022 04/21/2022   Weight 68.9 kg 67.6 kg 64.1 kg 64.4 kg 59.6 kg            IBW: 54.4 kg (120 lb)  % Ideal Body Weight: 109 %  BMI (Calculated): 22.55    Usual Weight: 69.9 kg (154 lb)  % Usual Weight: 85 %     Wt Readings from Last 10 Encounters:   04/21/22 59.6 kg (131 lb 6.3 oz)   03/27/22 64.4 kg (141 lb 15.6 oz)   02/13/22 64.1 kg (141 lb 6.4 oz)   01/30/22 68.9 kg (151 lb 12.8 oz)   12/19/21 68 kg (150 lb)   04/08/21 67.6 kg (149 lb)   03/18/21 66.7 kg (147 lb)   02/24/21 66.7 kg (147 lb)   02/06/21 66.8 kg (147 lb 5.1 oz)   01/18/21 67.5 kg (148 lb 13 oz)     -10 lb x1 month (7% severe change)  -20 lb x3 months (  13.2% severe change)     Estimated Nutrition Needs   Based on current wt 59.6 kg (considerations: severe malnutrition):  1788-2086 kcal/day (30-35 kcal/kg)  72-89 gm protein/day (1.2-1.5 gm protein/kg)     Diet Education   Not appropriate at present          Malnutrition Assessment using AND/ASPEN Consensus Criteria    Malnutrition in the Context of: Mild-moderate inflammation/chronic disease   Energy Intake: <75% of estimated energy requirement for > or = 1 month; Supportive data: patient reported poor appetite/intake for ''a long time.'' At least 3 weeks.  Weight Loss: > 5% in 1 month; Supportive data: Patient reported >20 lb weight loss (23 lb based on CBW and UBW) (15% severe change) over the past 2 weeks     Nutrition-Focused Physical Exam: 04/25/2022  Not performed: patient declined reattempt on follow-up        Patient meets criteria for: Severe protein calorie malnutrition     Nutrition Assessment   Anthropometrics: Body mass index is 22.55 kg/m?. - based on current wt 59.6 kg. Patient reported >20 lb weight loss over the past 2 weeks (15% severe change). UBW 154 lb.    Nutrition: Easy to chew diet, poor appetite, nibbles at food. Poor appetite/intake for at least 3 weeks, patient reported longer.    Tolerance/GI: Per RN flowsheet: Abdomen Inspection: Soft, Nondistended, No Tenderness. Had diarrhea yesterday (BM x2). Has not had any BM today.    Skin: Intact.    Labs:   Low Mg - replacing  Na, K, Phos WNL  BG 75    Meds: abx, magnesium sulfate, quetiapine  Continuous: D5/LR @ 50 ml/hr  PRN: dilaudid, ondansetron, oxycodone       Recommendations / Care Plan      1. Continue current diet as tolerated  2. Continue Boost Breeze supplements, if tolerates can try Ensure Enlive if patient is agreeable. Patient is aware of this  3. Monitor adequacy and tolerance of PO intake  4. Monitor lab work and weight changes as able.    RD to follow    Author:  Hollace Kinnier, RD, weekend pager [phone]  04/25/2022 3:49 PM

## 2022-04-25 NOTE — Progress Notes
General Surgery Note  PATIENT: Donna Gross  MRN: [id number]  DOB: [birth year]  DATE OF SERVICE: 04/25/2022  REASON FOR CONSULT/ADMISSION: c/f cystic duct stump leak  CHIEF COMPLAINT:   Chief Complaint   Patient presents with   ? Abdominal Pain     PT walk in to ED from home co diffuse abd pain 10/10 wo N/V since 03/27/2022. Per PT got D'cd from [facility] this morning for persistent abd pain.  Hx: cholecystectomy on 03/27/22 in [place].      Subjective:   Donna Gross is a 56 y.o. female hx COPD, PUD c/b gastric outlet obstruction s/p multiples surgeries including vagotomy, ?hernia repair with Dr [name], colon perf s/p colostomy bag s/p reversal, gallstone pancreatitis who had a cholecystectomy here on 4/8 was admitted to [facility] on 4/12 for abdominal pain and imaging workup revealed a possible cystic duct stump leak.    Review of outside records:  CT A&P on 4/19 noted subhepatic fluid collection which was drained on 4/21. On 4/25 a tubogram was performed and contrast injected through the drain showed opacification in the duodenum. However the outside facility lacked ERCP capabilities and the patient was discharged and her drain was removed.    Surgical history: abdomnal ulcer removed, hernia repair with Dr [name], colono perf s/p colostomy bag s/p reversal   Social history :smokes 1 cigarette /day x 20 years. Denies etoh , illicit drug use. Lives with daughter, Donna Gross.?      INTERVAL EVENTS/SUBJECTIVE:  5/4: NAEON, AFVSS. ERCP without evidence of leak, stent placed. Overnight, slept well. Endorses improvement in pain. Denies any nausea. Denies passing gas, however. Has not ambulated.    5/5: NAEON, AFVSS. Patient complaining of 3x diarrheal episodes today. Endorses persistent pain on current regimen, prefers percocet. PO 118cc, encouraged to ingest more PO. Agreeable to advancing from clear diet. WBC stable at 7.51.    5/6: NAEON, AFVSS. Continues to have diarrhea, C diff PCR negative, continues to report abdominal pain greater than her baseline abdominal pain, diminished PO intake although appetite increasing.  WBC stable 7.51->6.94.    Objective:     Last Recorded Vital Signs:    04/25/22 0611   BP: 106/73   Pulse: 70   Resp: 17   Temp: 36.7 ?C (98 ?F)   SpO2: 97%    Body mass index is 22.55 kg/m?Marland Kitchen  Gen - Well developed NAD  HEENT - NC AT  CV - Regular rate and rhythm  Pulm - Normal respiratory effort  Abd - Soft, ND, minimally tender to palpation diffusely, well healed surgical scars  Extrem - No edema, warm, well perfused  Skin - No rashes appreciated  LABS/MICRO:  Blood type: B Positive   Lab Results   Component Value Date    WBC 6.94 04/25/2022    HGB 9.9 (L) 04/25/2022    HCT 32.9 (L) 04/25/2022    MCV 89.9 04/25/2022    PLT 420 (H) 04/25/2022     Lab Results   Component Value Date    NA 137 04/25/2022    K 3.9 04/25/2022    BUN 3 (L) 04/25/2022    CREAT 0.80 04/25/2022    GLUCOSE 75 04/25/2022     Lab Results   Component Value Date    AST 20 04/25/2022    ALT 7 (L) 04/25/2022    ALKPHOS 98 04/25/2022    BILICON <0.2 04/22/2022    BILITOT <0.2 04/25/2022    ALBUMIN 3.2 (L) 04/25/2022    TOTPRO  6.3 04/25/2022    LDH 360 (H) 01/26/2020     Lab Results   Component Value Date    AMYLASE 93 12/19/2021    LIPASE 102 (H) 04/21/2022      Lab Results   Component Value Date    LACTATE 14 04/09/2021    COVID19QLPCR Not Detected 04/22/2022    TROPONIN <0.04 04/08/2021    PREGTESTBLD Negative 04/22/2022      IMAGING:  No imaging has been resulted in the last 24 hours   Assessment & Plan:   Michol Emory is a 56 y.o. female s/p lap chole on 4/8 admitted to OSH and found to have a possible cystic duct stump leak with development of subhepatic abscess s/p IR drainage. Transferred to [facility] for ERCP 5/3 which showed no evidence of cystic duct stump leak, stent placed.     Notably, per OSH records, underwent CT-guided drain placement on 4/21 with cultures demonstrating MDR E-coli. ID was consulted and recommended 10 days meropenem until 4/30 but was switched to ertapenem and was continued up until transfer.     Plan  - Soft diet, Ensures  - Bacterial stool studies for diarrhea  - Per ID, continue ertapenem IV until 5/6 for 14 day course started at OSH  - Pain control with multimodal PO pain regimen       - Increase robaxin 1000 mg TID, Percocet 5/10 ss       - DC IV dilaudid  - OOB  - DVT ppx with lovenox     Leotis Pain, R3  General Surgery  04/25/2022 9:03 AM

## 2022-04-26 LAB — Magnesium: MAGNESIUM: 1.6 meq/L (ref 1.4–1.9)

## 2022-04-26 LAB — Comprehensive Metabolic Panel
ANION GAP: 12 mmol/L (ref 8–19)
TOTAL CO2: 21 mmol/L (ref 20–30)

## 2022-04-26 LAB — CBC: RED BLOOD CELL COUNT: 3.37 x10E6/uL — ABNORMAL LOW (ref 3.96–5.09)

## 2022-04-26 LAB — Phosphorus: PHOSPHORUS: 3.6 mg/dL (ref 2.3–4.4)

## 2022-04-26 MED ADMIN — HYDROMORPHONE HCL 2 MG PO TABS: 2 mg | ORAL | @ 21:00:00 | Stop: 2022-04-28 | NDC 42858030125

## 2022-04-26 MED ADMIN — HYDROMORPHONE HCL 1 MG/ML IJ SOLN: .2 mg | INTRAVENOUS | @ 10:00:00 | Stop: 2022-04-26 | NDC 00409128331

## 2022-04-26 MED ADMIN — METHOCARBAMOL 500 MG PO TABS: 1000 mg | ORAL | @ 19:00:00 | Stop: 2022-04-28 | NDC 60687055911

## 2022-04-26 MED ADMIN — OXYCODONE-ACETAMINOPHEN 5-325 MG PO TABS: 2 | ORAL | @ 02:00:00 | Stop: 2022-05-01 | NDC 00406051262

## 2022-04-26 MED ADMIN — DEXTROSE 5 %/LACTATED RINGERS: 50 mL/h | INTRAVENOUS | @ 12:00:00 | Stop: 2022-05-22 | NDC 00338012504

## 2022-04-26 MED ADMIN — METHOCARBAMOL 500 MG PO TABS: 1000 mg | ORAL | @ 12:00:00 | Stop: 2022-04-28 | NDC 60687055911

## 2022-04-26 MED ADMIN — OXYCODONE HCL 5 MG PO TABS: 5 mg | ORAL | @ 19:00:00 | Stop: 2022-04-26 | NDC 00406055262

## 2022-04-26 MED ADMIN — MAGNESIUM SULFATE 4 GM/100ML IV SOLN: 4 g | INTRAVENOUS | @ 16:00:00 | Stop: 2022-04-26 | NDC 00409672923

## 2022-04-26 MED ADMIN — ONDANSETRON HCL 4 MG/2ML IJ SOLN: 4 mg | INTRAVENOUS | @ 22:00:00 | Stop: 2022-05-22 | NDC 60505613000

## 2022-04-26 MED ADMIN — HYDROMORPHONE HCL 1 MG/ML IJ SOLN: .2 mg | INTRAVENOUS | @ 03:00:00 | Stop: 2022-04-26 | NDC 00409128331

## 2022-04-26 MED ADMIN — OXYCODONE-ACETAMINOPHEN 5-325 MG PO TABS: 2 | ORAL | @ 08:00:00 | Stop: 2022-04-28 | NDC 00406051262

## 2022-04-26 MED ADMIN — POTASSIUM CHLORIDE CRYS ER 20 MEQ PO TBCR: 20 meq | ORAL | @ 16:00:00 | Stop: 2022-04-26 | NDC 00245531989

## 2022-04-26 MED ADMIN — OXYCODONE-ACETAMINOPHEN 5-325 MG PO TABS: 2 | ORAL | @ 16:00:00 | Stop: 2022-04-28 | NDC 00406051262

## 2022-04-26 MED ADMIN — ENOXAPARIN SODIUM 40 MG/0.4ML IJ SOSY: 40 mg | SUBCUTANEOUS | @ 03:00:00 | Stop: 2022-04-30

## 2022-04-26 MED ADMIN — METHOCARBAMOL 500 MG PO TABS: 1000 mg | ORAL | @ 04:00:00 | Stop: 2022-04-28 | NDC 60687055911

## 2022-04-26 MED ADMIN — QUETIAPINE FUMARATE 100 MG PO TABS: 100 mg | ORAL | @ 04:00:00 | Stop: 2022-04-28 | NDC 50268063211

## 2022-04-26 MED ADMIN — ONDANSETRON HCL 4 MG/2ML IJ SOLN: 4 mg | INTRAVENOUS | @ 04:00:00 | Stop: 2022-05-22 | NDC 60505613000

## 2022-04-26 MED ADMIN — QUETIAPINE FUMARATE 100 MG PO TABS: 100 mg | ORAL | @ 16:00:00 | Stop: 2022-04-28 | NDC 50268063211

## 2022-04-26 MED ADMIN — ERTAPENEM IVPB: 1 g | INTRAVENOUS | @ 02:00:00 | Stop: 2022-04-26 | NDC 55150028220

## 2022-04-26 NOTE — Other
Patient's Clinical Goal:   Clinical Goal(s) for the Shift: VSS, safety, comfort  Identify possible barriers to advancing the care plan:   Stability of the patient: Moderately Unstable - medium risk of patient condition declining or worsening     Problem/Goal:   Abd pain, N/V  HX: COPD, PUD c/b gastric outlet obstruction s/p multiples surgeries including vagotomy, hernia repair with Dr Bridgett Larsson, colon perf s/p colostomy bag s/p reversal, gallstone pancreatitis who had a cholecystectomy here on 4/8 was admitted to [facility] on 4/12 for abdominal pain and imaging workup revealed a possible cystic duct stump leak. Cholecystectomy on 4/7 in [place]  Response to Plan of Care and any changes in condition (clinical condition, any concerning assessments, events, interventions; response to POC effectiveness or changes made):    A/O  X4, on RA, BMAT 4   On soft diet and Ensure   Bacterial stool studies pending   Pain management: PRN Oxycodone Q 6 hrs, and PRN Dilaudid  2 mg Q 6 oral   Potassium 3.5 replaced 20 MEq   Mag replaced 4 g IV    Interdisciplinary communication (team member communication):        Psychosocial communication (family or patient issues potentially affecting care):        Plan and Disposition (progression toward specific goals of care/hospitalization and discharge):   Pain management    Advance diet as tolerated   Repeat ERCP in 2-3 months for stent removal/exchange per GI note   DC home when medically stable

## 2022-04-26 NOTE — Other
Patient's Clinical Goal:   Clinical Goal(s) for the Shift: VSS, pain management, treat/prevent infection, safety and comfort, patient education  Identify possible barriers to advancing the care plan: none  Stability of the patient: Moderately Stable - low risk of patient condition declining or worsening   Progression of Patient's Clinical Goal:     Hx: COPD, PUD c/b gastric outlet obstruction s/p multiples surgeries including vagotomy, hernia repair with Dr [name], colon perf s/p colostomy bag s/p reversal, gallstone pancreatitis    Problems: cholecystectomy here on 4/8 was admitted to [facility] on 4/12 for abdominal pain and imaging workup revealed a possible cystic duct stump leak s/p ERCP on 5/3 which showed no leak, s/p stent placement, also CT A&P on 4/19 noted subhepatic fluid collection which was drained on 4/21    Response to Plan of Care and any changes in condition (clinical condition, any concerning assessments, events, interventions; response to POC effectiveness or changes made):     s/p Ertapenem IVPB daily    Continued D5/LR @ 50 cc/hr   On RA   Abdominal pain managed with Robaxin PO, Percocet PO PRN and Dilaudid IVP PRN BTP   Tolerating small amount of food, patient tolertated Boost supplement drink very well - was asking where she can get those for home, will endorse    LBM 5/7 - 4 diarrhea episodes overnight, negative for C-diff   Stool was sent for bacterial enteric pathogen panel PCR - in progress   BMAT 4   Voiding freely     Interdisciplinary communication (team member communication):    None?    Psychosocial communication (family or patient issues potentially affecting care):    None    Plan and Disposition (progression toward specific goals of care/hospitalization and discharge):    Advance diet as tolerated, continue pain management   Repeat ERCP in 2-3 months for stent removal/exchange per GI note    Update BOOST

## 2022-04-26 NOTE — Progress Notes
General Surgery Note  PATIENT: Donna Gross  MRN: [id number]  DOB: 02-Apr-1966  DATE OF SERVICE: 04/26/2022  REASON FOR CONSULT/ADMISSION: c/f cystic duct stump leak  CHIEF COMPLAINT:   Chief Complaint   Patient presents with   ? Abdominal Pain     PT walk in to ED from home co diffuse abd pain 10/10 wo N/V since 03/27/2022. Per PT got D'cd from [facility] this morning for persistent abd pain.  Hx: cholecystectomy on 03/27/22 in [place].      Subjective:   Donna Gross is a 56 y.o. female hx COPD, PUD c/b gastric outlet obstruction s/p multiples surgeries including vagotomy, ?hernia repair with Dr [name], colon perf s/p colostomy bag s/p reversal, gallstone pancreatitis who had a cholecystectomy here on 4/8 was admitted to [facility] on 4/12 for abdominal pain and imaging workup revealed a possible cystic duct stump leak.    Review of outside records:  CT A&P on 4/19 noted subhepatic fluid collection which was drained on 4/21. On 4/25 a tubogram was performed and contrast injected through the drain showed opacification in the duodenum. However the outside facility lacked ERCP capabilities and the patient was discharged and her drain was removed.    Surgical history: abdomnal ulcer removed, hernia repair with Dr [name], colono perf s/p colostomy bag s/p reversal   Social history :smokes 1 cigarette /day x 20 years. Denies etoh , illicit drug use. Lives with daughter, Donna Gross.?      INTERVAL EVENTS/SUBJECTIVE:  5/4: NAEON, AFVSS. ERCP without evidence of leak, stent placed. Overnight, slept well. Endorses improvement in pain. Denies any nausea. Denies passing gas, however. Has not ambulated.  5/5: NAEON, AFVSS. Patient complaining of 3x diarrheal episodes today. Endorses persistent pain on current regimen, prefers percocet. PO 118cc, encouraged to ingest more PO. Agreeable to advancing from clear diet. WBC stable at 7.51.  5/6: NAEON, AFVSS. Continues to have diarrhea, C diff PCR negative, continues to report abdominal pain greater than her baseline abdominal pain, diminished PO intake although appetite increasing.  WBC stable 7.51->6.94.    5/7: NAEON. AFVSS. Continues to endorse diarrhea, especially overnight. Pain decreased. WBC stable 5.58 (from 6.94)    Objective:     Last Recorded Vital Signs:    04/26/22 0801   BP: 111/74   Pulse: 72   Resp: 15   Temp: 36.9 ?C (98.4 ?F)   SpO2: 94%    Body mass index is 22.55 kg/m?Marland Kitchen  Gen - Well developed NAD  HEENT - NC AT  CV - Regular rate and rhythm  Pulm - Normal respiratory effort  Abd - Soft, ND, minimally tender to palpation diffusely, well healed surgical scars  Extrem - No edema, warm, well perfused  Skin - No rashes appreciated    LABS/MICRO:  Blood type: B Positive   Lab Results   Component Value Date    WBC 5.58 04/26/2022    HGB 9.1 (L) 04/26/2022    HCT 32.4 (L) 04/26/2022    MCV 96.1 04/26/2022    PLT 369 04/26/2022     Lab Results   Component Value Date    NA 139 04/26/2022    K 3.5 (L) 04/26/2022    BUN 3 (L) 04/26/2022    CREAT 0.82 04/26/2022    GLUCOSE 88 04/26/2022     Lab Results   Component Value Date    AST 20 04/26/2022    ALT <5 (L) 04/26/2022    ALKPHOS 97 04/26/2022    BILICON <0.2 04/22/2022  BILITOT <0.2 04/26/2022    ALBUMIN 3.0 (L) 04/26/2022    TOTPRO 6.1 04/26/2022    LDH 360 (H) 01/26/2020     Lab Results   Component Value Date    AMYLASE 93 12/19/2021    LIPASE 102 (H) 04/21/2022      Lab Results   Component Value Date    LACTATE 14 04/09/2021    COVID19QLPCR Not Detected 04/22/2022    TROPONIN <0.04 04/08/2021    PREGTESTBLD Negative 04/22/2022      IMAGING:  XR chest ap portable (1 view)    Result Date: 04/25/2022  IMPRESSION: Persistent bibasilar airspace opacities, probably inflammatory/infectious. No pleural effusion, or pneumothorax. Cardiomediastinal silhouette is stable. No acute osseous abnormalities. Partially imaged catheter projecting over the visualized right upper abdominal quadrant. I, Glennis Brink, MD have reviewed these images and agree with the above findings. Signed by: Glennis Brink   04/25/2022 10:56 AM     Assessment & Plan:   Taffy Delconte is a 56 y.o. female s/p lap chole on 4/8 admitted to OSH and found to have a possible cystic duct stump leak with development of subhepatic abscess s/p IR drainage. Transferred to [facility] for ERCP 5/3 which showed no evidence of cystic duct stump leak, stent placed.     Notably, per OSH records, underwent CT-guided drain placement on 4/21 with cultures demonstrating MDR E-coli. ID was consulted and recommended 10 days meropenem until 4/30 but was switched to ertapenem and was continued up until transfer.     Plan  - Soft diet, Ensures  - F/u Bacterial stool studies for diarrhea  - Discontinued antibiotics (ertapenem on 04/25/22)   - Pain control with multimodal PO pain regimen       - Continue robaxin 1000 mg TID, Percocet 5/10 ss       - DC IV dilaudid  - OOB  - DVT ppx with lovenox     Signed: Earney Hamburg, MD  04/26/2022 8:04 AM  [place] Surgery service pager [phone]  DWA [name]

## 2022-04-26 NOTE — Other
Patient's Clinical Goal:   Clinical Goal(s) for the Shift: VSS, pain mgt  Identify possible barriers to advancing the care plan: none  Stability of the patient: Moderately Stable - low risk of patient condition declining or worsening   Progression of Patient's Clinical Goal:      Problem/Goal: Abdominal pain, Cystic duct stump leak    Response to Plan of Care and any changes in condition (clinical condition, any concerning assessments, events, interventions; response to POC effectiveness or changes made):    A/Ox 4   Titrated to room air, sat 98%   Afebrile. VSS   Denies chest pain or palpitations   Reports abdominal pain. Percocet given x 2 doses today with moderate relief. Dilaudid 0.2 mg given x 1 for breakthrough   ERCP 5/3 which showed no evidence of cystic duct stump leak, stent placed. Surgery following     Tolerating small amounts of a soft diet   On D5 LR @ 50 ml/hr   C-diff negative. No BMs today after having several loose BMs yesterday. Bacterial enteric pathogen panel pending collection of stool sample   On Ertapenem per ID.     Interdisciplinary communication (team member communication):    na    Psychosocial communication (family or patient issues potentially affecting care):    na    Plan and Disposition (progression toward specific goals of care/hospitalization and discharge):   Multimodal pain control   Continue Ertapenem   Advance diet as tolerated   Discharge per surgery recs

## 2022-04-27 ENCOUNTER — Ambulatory Visit: Payer: PRIVATE HEALTH INSURANCE

## 2022-04-27 DIAGNOSIS — K839 Disease of biliary tract, unspecified: Secondary | ICD-10-CM

## 2022-04-27 DIAGNOSIS — E878 Other disorders of electrolyte and fluid balance, not elsewhere classified: Secondary | ICD-10-CM

## 2022-04-27 LAB — CBC: MEAN CORPUSCULAR VOLUME: 90.5 fL (ref 79.3–98.6)

## 2022-04-27 LAB — Comprehensive Metabolic Panel
ALANINE AMINOTRANSFERASE: 6 U/L — ABNORMAL LOW (ref 8–70)
CALCIUM: 8.6 mg/dL (ref 8.6–10.4)

## 2022-04-27 LAB — Phosphorus: PHOSPHORUS: 3.5 mg/dL (ref 2.3–4.4)

## 2022-04-27 LAB — Magnesium: MAGNESIUM: 1.7 meq/L (ref 1.4–1.9)

## 2022-04-27 MED ORDER — POLYETHYLENE GLYCOL 3350 17 GM/SCOOP PO POWD
17 g | Freq: Every day | ORAL | 0 refills | 30.00000 days | Status: AC
Start: 2022-04-27 — End: ?
  Filled 2022-04-28: qty 510, 30d supply, fill #0

## 2022-04-27 MED ORDER — HYDROCODONE-ACETAMINOPHEN 5-325 MG PO TABS
2 | ORAL_TABLET | Freq: Four times a day (QID) | ORAL | 0 refills | 5.00000 days | Status: AC | PRN
Start: 2022-04-27 — End: 2022-04-28

## 2022-04-27 MED ORDER — OXYCODONE-ACETAMINOPHEN 5-325 MG PO TABS
2 | ORAL_TABLET | Freq: Three times a day (TID) | ORAL | 0 refills | 7.00000 days | Status: AC | PRN
Start: 2022-04-27 — End: ?
  Filled 2022-04-28: qty 42, 7d supply, fill #0

## 2022-04-27 MED ADMIN — MAGNESIUM SULFATE 2 GM/50ML IV SOLN: 2 g | INTRAVENOUS | @ 17:00:00 | Stop: 2022-04-27 | NDC 25021061281

## 2022-04-27 MED ADMIN — ONDANSETRON HCL 4 MG/2ML IJ SOLN: 4 mg | INTRAVENOUS | @ 06:00:00 | Stop: 2022-04-28 | NDC 60505613000

## 2022-04-27 MED ADMIN — HYDROMORPHONE HCL 2 MG PO TABS: 2 mg | ORAL | @ 01:00:00 | Stop: 2022-04-28 | NDC 42858030125

## 2022-04-27 MED ADMIN — METHOCARBAMOL 500 MG PO TABS: 1000 mg | ORAL | @ 21:00:00 | Stop: 2022-04-28 | NDC 60687055911

## 2022-04-27 MED ADMIN — ONDANSETRON HCL 4 MG/2ML IJ SOLN: 4 mg | INTRAVENOUS | @ 16:00:00 | Stop: 2022-04-28 | NDC 60505613000

## 2022-04-27 MED ADMIN — ENOXAPARIN SODIUM 40 MG/0.4ML IJ SOSY: 40 mg | SUBCUTANEOUS | @ 03:00:00 | Stop: 2022-04-30

## 2022-04-27 MED ADMIN — OXYCODONE-ACETAMINOPHEN 5-325 MG PO TABS: 2 | ORAL | @ 14:00:00 | Stop: 2022-04-28 | NDC 00406051262

## 2022-04-27 MED ADMIN — OXYCODONE-ACETAMINOPHEN 5-325 MG PO TABS: 2 | ORAL | @ 21:00:00 | Stop: 2022-04-28 | NDC 00406051262

## 2022-04-27 MED ADMIN — OXYCODONE-ACETAMINOPHEN 5-325 MG PO TABS: 2 | ORAL | @ 03:00:00 | Stop: 2022-04-28 | NDC 00406051262

## 2022-04-27 MED ADMIN — DEXTROSE 5 %/LACTATED RINGERS: 50 mL/h | INTRAVENOUS | @ 08:00:00 | Stop: 2022-04-28 | NDC 00338012504

## 2022-04-27 MED ADMIN — METHOCARBAMOL 500 MG PO TABS: 1000 mg | ORAL | @ 04:00:00 | Stop: 2022-04-28 | NDC 60687055911

## 2022-04-27 MED ADMIN — OXYCODONE-ACETAMINOPHEN 5-325 MG PO TABS: 2 | ORAL | @ 08:00:00 | Stop: 2022-04-28 | NDC 00406051262

## 2022-04-27 MED ADMIN — QUETIAPINE FUMARATE 100 MG PO TABS: 100 mg | ORAL | @ 16:00:00 | Stop: 2022-04-28 | NDC 50268063211

## 2022-04-27 MED ADMIN — METHOCARBAMOL 500 MG PO TABS: 1000 mg | ORAL | @ 12:00:00 | Stop: 2022-04-28 | NDC 60687055911

## 2022-04-27 MED ADMIN — LIDOCAINE 5 % EX PTCH: 1 | TRANSDERMAL | @ 01:00:00 | Stop: 2022-05-27

## 2022-04-27 MED ADMIN — QUETIAPINE FUMARATE 100 MG PO TABS: 100 mg | ORAL | @ 04:00:00 | Stop: 2022-05-22 | NDC 60687034911

## 2022-04-27 MED ADMIN — HYDROMORPHONE HCL 2 MG PO TABS: 2 mg | ORAL | @ 17:00:00 | Stop: 2022-04-28 | NDC 42858030125

## 2022-04-27 MED ADMIN — HYDROMORPHONE HCL 2 MG PO TABS: 2 mg | ORAL | @ 23:00:00 | Stop: 2022-04-28 | NDC 42858030125

## 2022-04-27 NOTE — Discharge Summary
DISCHARGE SUMMARY/ GENERAL SURGERY  PATIENT:  Donna Gross MRN:  [id number]  DOB:  1966/07/02    Attending Physician: Alonna Buckler., MD  Primary Care Physician:  Kelle Darting, MD    Admission Date:  04/22/2022  Discharge Date:  04/27/22    Admission Diagnosis:    *Postoperative complication [T81.9XXA]  Abdominal pain [R10.9]  Discharge Diagnosis:    Postoperative complication [T81.9XXA]  Abdominal pain [R10.9]    Surgery Performed:   ERCP and biliary stent placement    Complications:   None    Consultations:   GI  Infectious Diseases    Chronic Conditions:  Past Medical History:   Diagnosis Date   ? Anxiety    ? Depression    ? Emphysema, unspecified (HCC/RAF)    ? Emphysema, unspecified (HCC/RAF)    ? Fibromyalgia    ? Gastritis    ? GERD (gastroesophageal reflux disease)    ? Pancreatitis        HPI: Donna Gross is a 56 y.o. female hx COPD, PUD c/b gastric outlet obstruction s/p multiples surgeries including vagotomy, ?hernia repair with Dr [name], colon perf s/p colostomy bag s/p reversal, gallstone pancreatitis who had a cholecystectomy here on 4/8 was admitted to [facility] on 4/12 for abdominal pain and imaging workup revealed a possible cystic duct stump leak.  ?  Review of outside records:  CT A&P on 4/19 noted subhepatic fluid collection which was drained on 4/21. On 4/25 a tubogram was performed and contrast injected through the drain showed opacification in the duodenum. However the outside facility lacked ERCP capabilities and the patient was discharged and her drain was removed.    Brief Hospital Course:     On  04/22/2022 the patient was taken to the operating room at [facility] and underwent ERCP with biliary stenting per GI. Findings as below:    DIAGNOSIS:  1. No obvious bile leak seen. But given clinical concern for bile leak following recent cholecystectomy, a 7 Fr biliary stent was placed.  2. Anatomy consistent with Billroth 2.  ?  RECOMMENDATIONS:  1. Watch for complications including bleeding, perforation, and pancreatitis.  2. Consider cross sectional imaging to evaluate for any residual fluid collection from suspected bile leak.  3. Repeat ERCP 2-3 months for stent removal/exchange.    The patient tolerated the operation well, without complications, and was admitted for standard postoperative management and monitoring. The patient was subsequently observed and continued course of IV antibiotics. Patient was on ertapenem at OSH for MDR E coli grown from abscess drainage on 4/21; and per ID recs, continued until 5/6 for full 14 day course. POD1, 5/4, however patient was started on CLD and pain controlled with IV/PO pain regimen. POD2, patient advanced to soft diet and Ensures. Also found to be complaining of diarrhea, so C. Diff studies were also sent, which came back negative. WBC continued to downtrend and patient increased PO intake. Patient was also transitioned to PO pain regimen. By POD5, the patient was breathing easily on room air , had return of bowel function, was tolerating a diet, and had adequate pain control with oral medication. The patient was deemed stable for discharge.    The patient was discharged with PO pain regimen. At follow-up, plan to address chronic pain regimen. Also discharged with instructions to follow-up with GI for removal of stent.    Physical Exam:  BP 112/75 (BP Location: Right arm, Patient Position: Lying)  ~ Pulse 63  ~  Temp 36.8 ?C (98.2 ?F) (Oral)  ~ Resp 17  ~ Ht 5' 4'' (1.626 m)  ~ Wt 131 lb 6.3 oz (59.6 kg)  ~ SpO2 99%  ~ BMI 22.55 kg/m?   Gen -?Well developed NAD  HEENT -?NC AT  CV -?Regular rate and rhythm  Pulm -?Normal respiratory effort  Abd -?Soft, ND, minimally tender to palpation diffusely, well healed surgical scars  Extrem -?No edema, warm, well perfused  Skin -?No rashes appreciated    Discharge Labs:  WBC/Hgb/Hct/Plts:  5.94/9.2/30.6/408 (05/08 0441)   Na/K/Cl/CO2/BUN/Cr/glu:  140/4.3/107/24/2/0.80/75 (05/08 0441)  Lab Results Component Value Date    ALT 6 (L) 04/27/2022    AST 18 04/27/2022    ALKPHOS 101 04/27/2022    BILITOT <0.2 04/27/2022       Imaging/Studies:  CT abd+pelvis w contrast    Result Date: 04/22/2022  CT ABD+PELVIS W CONTRAST CLINICAL HISTORY: Abdominal pain, recent cholecystectomy. COMPARISON: CT abdomen pelvis 12/19/2021. TECHNIQUE: At the time of the scan, 50 cc of IV contrast media was noted to have extravasated from left antecubital fossa  peripheral intravenous line site and was seen by Dr.Rahsepar. Patient was examined, provided with teaching and treated per department protocol. Further details are available in [facility] progress note dated 04/22/2022. On a multirow-detector CT scanner, a noncontrast enhanced scan was performed through the abdomen and pelvis. CONTRAST: iohexol (Omnipaque) 350 mg/mL inj 100 mL RADIATION DOSE: The patient received the following exposure event(s) during this study, and the dose reference values for each are as shown (CTDIvol in mGy, DLP in mGy-cm). Note that the values are not patient dose but numbers generated from scan acquisition factors based on 32 cm (L) and/or 16 cm (S) phantoms and may substantially under-estimate or over-estimate actual patient dose based on patient size and other factors. PreMonitoring, CTDI(L): 0.7, DLP: 0.7;Abd/Pel wo, CTDI(L): 6.6, DLP: 284 FINDINGS: Lower chest: Bibasilar atelectasis/scarring. New small to moderate pericardial effusion. Coronary calcifications. Liver: Unremarkable. Gallbladder and bile ducts: Status post cholecystectomy with unchanged curvilinear high density material adjacent to the second portion of the duodenum, likely surgical material. Similar degree of extrahepatic biliary dilatation. There is likely intrahepatic biliary dilatation not significantly changed compared to prior, although this is suboptimally evaluated without contrast. Spleen: Unremarkable. Pancreas: Unremarkable. Adrenals: Unremarkable. Kidneys and ureters: Contrast is seen within the bilateral collecting systems. No hydronephrosis. No definite nephrolithiasis. Bowel: Postsurgical changes associated with gastric bypass. Predominantly left sided colonic diverticulosis. Possible small duodenal diverticulum along the second portion. Mild thickening of the hepatic flexure colon and second portion of the duodenum. Bladder: Unremarkable. Reproductive organs: Unremarkable. Lymph nodes: Unremarkable. Peritoneum: Subtle thickening of the right anterior renal fascia and subtle stranding in the hepatorenal fossa. No free fluid or free air.. Vessels: Extensive atherosclerosis. Abdominal wall: Postsurgical changes in the anterior abdominal wall. Dystrophic right gluteal calcifications. Bones: Degenerative changes of the spine. Unchanged grade 1 anterolisthesis of L5.     IMPRESSION: This is essentially a noncontrast enhanced study due to contrast extravasation as described above. 1.  Status post cholecystectomy with similar degree of intrahepatic and extrahepatic biliary dilatation. 2.  Subtle stranding of the hepatorenal fossa/right anterior renal fascia with mild thickening of the hepatic flexure of the colon and second portions of duodenum. No fluid collection. Findings are nonspecific but may be posttreatment/inflammatory changes secondary to recent subhepatic fluid collection and drainage per EMR. 3.  New small to moderate pericardial effusion. I, Lou Cal, M.D., have reviewed this radiological study personally and I am in full  agreement with the findings of the report presented here. Dictated by: Kirk Ruths   04/22/2022 6:39 AM Signed by: Lou Cal   04/22/2022 8:09 AM    XR chest ap portable (1 view)    Result Date: 04/25/2022  EXAM:  XR CHEST AP 1V PORTABLE COMPARISON: Chest x-ray February 14, 2022 INDICATION:   O2 requirement     IMPRESSION: Persistent bibasilar airspace opacities, probably inflammatory/infectious. No pleural effusion, or pneumothorax. Cardiomediastinal silhouette is stable. No acute osseous abnormalities. Partially imaged catheter projecting over the visualized right upper abdominal quadrant. I, Glennis Brink, MD have reviewed these images and agree with the above findings. Signed by: Glennis Brink   04/25/2022 10:56 AM    FL abdomen fluoroscopy    Result Date: 04/24/2022  FL ABDOMEN FLUOROSCOPY DATE: 04/22/2022 12:00 PM HISTORY: Biliary obstruction.     IMPRESSION:  This procedure was performed in the Operating room. 6 minutes of fluoro time were utilized. Signed by: Olena Heckle   04/24/2022 4:44 PM      Discharge Medications:   Current Discharge Medication List      START taking these medications    Details   HYDROcodone-acetaminophen 5-325 mg tablet Take 2 tablets by mouth every six (6) hours as needed for Severe Pain (Pain Scale 7-10). Max Daily Amount: 8 tablets  Qty: 40 tablet, Refills: 0         CONTINUE these medications which have NOT CHANGED    Details   acetaminophen 325 mg tablet Take 2 tablets (650 mg total) by mouth every six (6) hours as needed for Pain.      clobetasol 0.05% cream Apply topically three (3) times daily as needed (rash).      docusate 100 mg capsule Take 1 capsule (100 mg total) by mouth two (2) times daily While on narcotic pain medication.  Qty: 30 capsule, Refills: 2      FLUoxetine 40 mg capsule Take 1 capsule (40 mg total) by mouth daily.      fluticasone-salmeterol 250-50 mcg/dose diskus Inhale 1 puff two (2) times daily as needed (SOB).      ipratropium-albuterol 20-100 mcg/act inhaler Inhale 2 puffs three (3) times daily as needed (SOB).      methocarbamol 750 mg tablet Take 1 to 2 tablets by mouth every 6 to 8 hours as needed for pain muscle pain/spasms.  Qty: 30 tablet, Refills: 0      metoclopramide 10 mg tablet Take 1 tablet (10 mg total) by mouth three (3) times daily before meals.  Qty: 90 tablet, Refills: 0      multivitamin tablet Take 1 tablet by mouth daily.  Qty: 30 tablet, Refills: 0      Naloxone HCl 4 MG/0.1ML LIQD Call 911. Administer a single spray intranasally into one nostril for opioid overdose. May repeat in 3 minutes if patient is not breathing.Rob Bunting: 2 each, Refills: 2    Comments: Pharmacist may substitute non-preferred auto-injector or self-injector formulations, if needed.      ondansetron ODT 4 mg disintegrating tablet Take 1 tablet (4 mg total) by mouth every six (6) hours as needed for Nausea or Vomiting.  Qty: 12 tablet, Refills: 0      oxyCODONE 5 mg tablet Take 1 to 2 tablets by mouth every 6 to 8 hours as needed for pain.Rob Bunting: 30 tablet, Refills: 0      oxyCODONE-acetaminophen 5-325 mg tablet Take 2 tablets by mouth every four (4) hours as needed. Max Daily  Amount: 12 tablets  Qty: 15 tablet, Refills: 0      pantoprazole 40 mg DR tablet Take 1 tablet (40 mg total) by mouth daily.      polyethylene glycol powder Mix and drink 17 grams in liquid by mouth daily while on narcotic pain medication.  Qty: 510 g, Refills: 0      quetiapine 100 mg tablet Take 1 tablet (100 mg total) by mouth two (2) times daily.      sucralfate 1 g/10 mL suspension Take 10 mLs (1 g total) by mouth three (3) times daily with meals and at bedtime.  Qty: 420 mL, Refills: 3    Associated Diagnoses: Acute epigastric pain      traZODone 100 mg tablet Take 1 tablet (100 mg total) by mouth at bedtime.              Disposition: Home.      Discharge Condition: Stable    Diet: Soft diet      Activity/ Restrictions: Ambulate as tolerated.  No heavy lifting over 5-10 pounds for 4-6 weeks, and may increase activity as tolerated.  May shower once dressings are removed but do not submerge your incisions in a bath or pool.    Return Precautions: As listed on After Visit Summary (AVS), but also including:      - Onset of severe, persistent pain not relieved by medication and rest.      - Difficulty obtaining medications      - Any new onset of or increased weakness, numbness or tingling      - Persistent chills; new onset of fever > 101 degrees F, or night sweats      - Any redness, swelling, drainage, heat, or pain around your incision      - Any new onset of chest pain or shortness of breath      - If applicable, any clogging or dislodgement of your surgical drain.    Post-Discharge Referrals: None     Post-Discharge Appointments:   The patient was asked to follow up with surgeon for a postoperative visit at [facility] clinic within 1 weeks.  Please, call [phone] to set up a follow up appointment.      Also asked to follow-up with GI for removal of stent.    Additional appointments should be made with:   Follow up with your primary care doctor in 2-3 weeks.    The patient was seen and examined by the General Surgery team.  Above plan was discussed with chief resident and attending physician, Alonna Buckler., MD, who were in agreement with the plan.    Author: Ulyses Amor, MD  04/27/2022 4:33 PM  Division of General Surgery    Addendum: I have seen and examined the patient. I discussed in detail indications and treatment options. I attended the patient's consultation visit with the resident physician. I have reviewed the resident's report and agree with the documented findings and plan of care.       By:  Dineen Kid [name], M.D.

## 2022-04-27 NOTE — Consults
SPIRITUAL CARE CONSULTATION NOTE    PATIENT:  Donna Gross  MRN:  [id number]     Patient Info        Religious/Spiritual Identity:        None       Last Anointed Date:                 Baptised:                 Spiritual Care Visit Details              Date of Visit:  04/27/22  Time of Visit:  1055  Visited with Patient   Visit length 5 Minutes   Referral source Self-initiated   Reason for visit Initial visit/assessment      Spiritual Assessment     Spiritual practices & resources Chaplain visits   Areas of spiritual/emotional distress Adjustment to illness/hospitalization   Distressful feelings     Indicators of spiritual wellbeing Able to receive love and support   Expressions of spiritual wellbeing        Plan     Spiritual care intervention Introduction to chaplain services, Offered words of comfort/encouragement   Outcomes (per patient/family) Appreciated visit   Spiritual care plans Continue to visit as needed   Additional comments        Recommendation           Author:  Thara Searing Gross Donna Gross 04/27/2022 3:16 PM  Contact info: SM pager: [phone] ext: [phone]

## 2022-04-27 NOTE — Progress Notes
General Surgery Note  PATIENT: Donna Gross  MRN: [id number]  DOB: March 30, 1966  DATE OF SERVICE: 04/27/2022  REASON FOR CONSULT/ADMISSION: c/f cystic duct stump leak  CHIEF COMPLAINT:   Chief Complaint   Patient presents with   ? Abdominal Pain     PT walk in to ED from home co diffuse abd pain 10/10 wo N/V since 03/27/2022. Per PT got D'cd from [facility] this morning for persistent abd pain.  Hx: cholecystectomy on 03/27/22 in [place].      Subjective:   Donna Gross is a 56 y.o. female hx COPD, PUD c/b gastric outlet obstruction s/p multiples surgeries including vagotomy, ?hernia repair with Dr [name], colon perf s/p colostomy bag s/p reversal, gallstone pancreatitis who had a cholecystectomy here on 4/8 was admitted to [facility] on 4/12 for abdominal pain and imaging workup revealed a possible cystic duct stump leak.    Review of outside records:  CT A&P on 4/19 noted subhepatic fluid collection which was drained on 4/21. On 4/25 a tubogram was performed and contrast injected through the drain showed opacification in the duodenum. However the outside facility lacked ERCP capabilities and the patient was discharged and her drain was removed.    Surgical history: abdomnal ulcer removed, hernia repair with Dr [name], colono perf s/p colostomy bag s/p reversal   Social history :smokes 1 cigarette /day x 20 years. Denies etoh , illicit drug use. Lives with daughter, Donna Gross.?      INTERVAL EVENTS/SUBJECTIVE:  5/4: NAEON, AFVSS. ERCP without evidence of leak, stent placed. Overnight, slept well. Endorses improvement in pain. Denies any nausea. Denies passing gas, however. Has not ambulated.  5/5: NAEON, AFVSS. Patient complaining of 3x diarrheal episodes today. Endorses persistent pain on current regimen, prefers percocet. PO 118cc, encouraged to ingest more PO. Agreeable to advancing from clear diet. WBC stable at 7.51.  5/6: NAEON, AFVSS. Continues to have diarrhea, C diff PCR negative, continues to report abdominal pain greater than her baseline abdominal pain, diminished PO intake although appetite increasing.  WBC stable 7.51->6.94.  5/7: NAEON. AFVSS. Continues to endorse diarrhea, especially overnight. Pain decreased. WBC stable 5.58 (from 6.94)  5/8: NAEON, AFVSS. Resting in bed, NAD. +gas. Endorses diarrhea. Tolerated 480cc PO. Tender diffusely but notes new pain in RUQ. WBC stable at 5.94.     Objective:     Last Recorded Vital Signs:    04/27/22 0831   BP: 124/84   Pulse: 74   Resp: 17   Temp: 36.7 ?C (98 ?F)   SpO2: 100%    Body mass index is 22.55 kg/m?Marland Kitchen  Gen - Well developed NAD  HEENT - NC AT  CV - Regular rate and rhythm  Pulm - Normal respiratory effort  Abd - Soft, ND, minimally tender to palpation diffusely, well healed surgical scars  Extrem - No edema, warm, well perfused  Skin - No rashes appreciated    LABS/MICRO:  Blood type: B Positive   Lab Results   Component Value Date    WBC 5.94 04/27/2022    HGB 9.2 (L) 04/27/2022    HCT 30.6 (L) 04/27/2022    MCV 90.5 04/27/2022    PLT 408 (H) 04/27/2022     Lab Results   Component Value Date    NA 140 04/27/2022    K 4.3 04/27/2022    BUN 2 (L) 04/27/2022    CREAT 0.80 04/27/2022    GLUCOSE 75 04/27/2022     Lab Results   Component Value Date  AST 18 04/27/2022    ALT 6 (L) 04/27/2022    ALKPHOS 101 04/27/2022    BILICON <0.2 04/22/2022    BILITOT <0.2 04/27/2022    ALBUMIN 3.1 (L) 04/27/2022    TOTPRO 6.0 (L) 04/27/2022    LDH 360 (H) 01/26/2020     Lab Results   Component Value Date    AMYLASE 93 12/19/2021    LIPASE 102 (H) 04/21/2022      Lab Results   Component Value Date    LACTATE 14 04/09/2021    COVID19QLPCR Not Detected 04/22/2022    TROPONIN <0.04 04/08/2021    PREGTESTBLD Negative 04/22/2022      IMAGING:  No imaging has been resulted in the last 24 hours   Assessment & Plan:   Donna Gross is a 56 y.o. female s/p lap chole on 4/8 admitted to OSH and found to have a possible cystic duct stump leak with development of subhepatic abscess s/p IR drainage. Transferred to [facility] for ERCP 5/3 which showed no evidence of cystic duct stump leak, stent placed.     Notably, per OSH records, underwent CT-guided drain placement on 4/21 with cultures demonstrating MDR E-coli. ID was consulted and recommended 10 days meropenem until 4/30 but was switched to ertapenem and was continued up until transfer.     Plan  - Soft diet, Ensures  - F/u Bacterial stool studies for diarrhea  - Discontinued antibiotics (ertapenem on 04/25/22)   - Pain control with multimodal PO pain regimen, consult to Chronic Pain today, appreciate eval and recs       - Currently on robaxin 1000 mg TID, Percocet 5/10 ss  - OOB  - DVT ppx with lovenox     Signed: Ulyses Amor, MD  04/27/2022 8:52 AM  [place] Surgery service pager [phone]  DWA [name]

## 2022-04-27 NOTE — Other
Patient's Clinical Goal:   Clinical Goal(s) for the Shift: VSS, pain management, improved appetite, safety and comfort, patient education  Identify possible barriers to advancing the care plan: none  Stability of the patient: Moderately Unstable - medium risk of patient condition declining or worsening    Progression of Patient's Clinical Goal:     Hx: COPD, PUD c/b gastric outlet obstruction s/p multiples surgeries including vagotomy, hernia repair with Dr [name], colon perf s/p colostomy bag s/p reversal, gallstone pancreatitis    Problems: cholecystectomy here on 4/8 was admitted to [facility] on 4/12 for abdominal pain and imaging workup revealed a possible cystic duct stump leak s/p ERCP on 5/3 which showed no leak, s/p stent placement, also CT A&P on 4/19 noted subhepatic fluid collection which was drained on 4/21    Response to Plan of Care and any changes in condition (clinical condition, any concerning assessments, events, interventions; response to POC effectiveness or changes made):     s/p Ertapenem IVPB daily    Continued D5/LR @ 50 cc/hr   On RA   Abdominal pain managed with Robaxin PO, Percocet PO PRN and Dilaudid PO   Tolerating small amount of food, patient tolertated Boost supplement drink very well - was asking where she can get those for home, will endorse    LBM 5/7   Stool was sent for bacterial enteric pathogen panel PCR - in progress   BMAT 4   Voiding freely     Interdisciplinary communication (team member communication):    None?    Psychosocial communication (family or patient issues potentially affecting care):    None    Plan and Disposition (progression toward specific goals of care/hospitalization and discharge):    Advance diet as tolerated, continue pain management   Repeat ERCP in 2-3 months for stent removal/exchange per GI note    Update BOOST

## 2022-04-27 NOTE — Consults
IP CM ACTIVE DISCHARGE PLANNING  Department of Care Coordination      Admit [phone]  Anticipated Date of Discharge: 04/27/2022    Following JH:ERDE, Dineen Kid., MD      Today's short update     Per notes pt to be discharged home with no identified discharge needs, once medically stable.    Disposition     Home, No needs identified  [address]   [place] [address]  Family/Support System in agreement with the current discharge plan: Yes, in agreement and participating             [facility],  04/27/2022

## 2022-04-28 LAB — Bacterial Enteric Pathogen Panel: SALMONELLA SPECIES: NOT DETECTED

## 2022-04-28 MED ADMIN — LIDOCAINE 5 % EX PTCH: 1 | TRANSDERMAL | @ 01:00:00 | Stop: 2022-04-28

## 2022-04-28 NOTE — Other
Patient's Clinical Goal:   Clinical Goal(s) for the Shift: VSS, Pain management, Monitor abd and bowel movements, Continuous fluids, comfort/safety  Identify possible barriers to advancing the care plan: None   Stability of the patient: Moderately Stable - low risk of patient condition declining or worsening   Progression of Patient's Clinical Goal:   Problem/Goal: Postop complications, Cystic duct stump leak, Abd pain     Response to Plan of Care and any changes in condition (clinical condition, any concerning assessments, events, interventions; response to POC effectiveness or changes made):    Neuro: A/Ox4, afebrile    Pain: c/o generalized pain, mainly in abd managed with 1x Oxy 2 tab PO PRN and 2x Dilaudid 2mg  PO PRN . Also c/o nausea managed with Zofran IV PRN    Cardiac: Non monitored, VSS    Respiratory: Non monitored, satting>95% on RA    GI: Easy to chew, mechanical soft diet, tolerated well with 1x dose of PRN Zofran. LBM 5/8, soft per pt    GU: Voiding in bathroom, Strict I&Os    Musculoskeletal: BMAT4, ambulatory    Skin: Intact    IV Access: 22g Lt FA removed by 7/8, RN     Interdisciplinary communication (team member communication):    None     Psychosocial communication (family or patient issues potentially affecting care):    None     Plan and Disposition (progression toward specific goals of care/hospitalization and discharge):   Preview AVS done with patient    DC meds picked up by DC lounge RN   IV removed by Asher Muir RN   DC dispo, home with no needs picked up by family via personal transport

## 2022-04-28 NOTE — Progress Notes
Pharmaceutical Services - Meds to [facility] Discharge Medication Reconciliation and Counseling Note    Patient Name: Donna Gross  Medical Record Number: [id number]  Admit date: 04/22/2022 5:26 AM    Age: 56 y.o.  Sex: female  Allergies: Hydrocodone, Ibuprofen, and Morphine    Preferred Pharmacy:   [id number] [place], [place] - [address] AT corner of [address]  [address] [place] [address]         I reconciled the discharge medications and counseled the patient on all new prescriptions. Discharge prescriptions were picked up from pharmacy by discharge lounge nurse. The purpose, potential side effects, storage, missed doses and any special instructions related to each medication was discussed. Medications continued from home were also reviewed with the patient.    Discharge Medication List from AVS:     Changes To My Medications        CHANGE how you take these medications        Dose Instructions Notes   * oxyCODONE-acetaminophen 5-325 mg tablet  Commonly known as: Percocet  What changed: Another medication with the same name was added. Make sure you understand how and when to take each.   2 tablet   Take 2 tablets by mouth every four (4) hours as needed. Max Daily Amount: 12 tablets      * oxyCODONE-acetaminophen 5-325 mg tablet  Commonly known as: Percocet  What changed: You were already taking a medication with the same name, and this prescription was added. Make sure you understand how and when to take each.   2 tablet   Take 2 tablets by mouth every eight (8) hours as needed for Severe Pain (Pain Scale 7-10). Max Daily Amount: 6 tablets            * This list has 2 medication(s) that are the same as other medications prescribed for you. Read the directions carefully, and ask your doctor or other care provider to review them with you.                CONTINUE taking these medications        Dose Instructions Notes   acetaminophen 325 mg tablet  Commonly known as: Tylenol   650 mg   Take 2 tablets (650 mg total) by mouth every six (6) hours as needed for Pain.      clobetasol 0.05% cream  Commonly known as: Temovate    Apply topically three (3) times daily as needed (rash).      docusate 100 mg capsule  Commonly known as: docusate   100 mg   Take 1 capsule (100 mg total) by mouth two (2) times daily While on narcotic pain medication.      FLUoxetine 40 mg capsule  Commonly known as: PROzac   40 mg   Take 1 capsule (40 mg total) by mouth daily.      fluticasone-salmeterol 250-50 mcg/dose diskus  Commonly known as: Advair   1 puff   Inhale 1 puff two (2) times daily as needed (SOB).      ipratropium-albuterol 20-100 mcg/act inhaler  Commonly known as: Respimat   2 puff   Inhale 2 puffs three (3) times daily as needed (SOB).      methocarbamol 750 mg tablet  Commonly known as: Robaxin    Take 1 to 2 tablets by mouth every 6 to 8 hours as needed for pain muscle pain/spasms.      metoclopramide  10 mg tablet  Commonly known as: Reglan   10 mg   Take 1 tablet (10 mg total) by mouth three (3) times daily before meals.      multivitamin tablet   1 tablet   Take 1 tablet by mouth daily.      naloxone 4 mg/0.1 mL nasal spray  Commonly known as: Narcan    Call 911. Administer a single spray intranasally into one nostril for opioid overdose. May repeat in 3 minutes if patient is not breathing..      ondansetron ODT 4 mg disintegrating tablet  Commonly known as: Zofran ODT   4 mg   Take 1 tablet (4 mg total) by mouth every six (6) hours as needed for Nausea or Vomiting.      oxyCODONE 5 mg tablet    Take 1 to 2 tablets by mouth every 6 to 8 hours as needed for pain..      pantoprazole 40 mg DR tablet  Commonly known as: Protonix   40 mg   Take 1 tablet (40 mg total) by mouth daily.      polyethylene glycol powder  Commonly known as: Glycolax   17 g   Mix 1 capful (17 g) with 8 oz of water and take by mouth daily.      QUEtiapine 100 mg tablet  Commonly known as: SEROquel   100 mg   Take 1 tablet (100 mg total) by mouth two (2) times daily.      sucralfate 1 g/10 mL suspension  Commonly known as: Carafate   1 g   Take 10 mLs (1 g total) by mouth three (3) times daily with meals and at bedtime.      traZODone 100 mg tablet   100 mg   Take 1 tablet (100 mg total) by mouth at bedtime.                Prescriptions        These medications were sent to [facility] [phone] [address], [place] [address]      Hours: Mon-Fri 8AM-6PM, Saturdays & Holidays 8AM-5PM (Closed 1PM-2PM for lunch); Closed Sundays Phone: [phone]   oxyCODONE-acetaminophen 5-325 mg tablet  polyethylene glycol powder     Patient was counseled on the indications, directions of use, and possible adverse effects of 2 medications. Patient's home medications and AVS were reviewed and the patient confirmed familiarity with all medications. Medications were picked up from the pharmacy by discharge lounge nurse. Patient was able to follow provided instructions and had no further questions or concerns.  2/N 0.00           Michell Heinrich, PharmD, 04/27/2022, 5:40 PM

## 2022-04-30 DIAGNOSIS — K8020 Calculus of gallbladder without cholecystitis without obstruction: Secondary | ICD-10-CM

## 2022-04-30 DIAGNOSIS — K269 Duodenal ulcer, unspecified as acute or chronic, without hemorrhage or perforation: Secondary | ICD-10-CM

## 2022-04-30 DIAGNOSIS — K8011 Calculus of gallbladder with chronic cholecystitis with obstruction: Secondary | ICD-10-CM

## 2022-05-02 ENCOUNTER — Ambulatory Visit: Payer: PRIVATE HEALTH INSURANCE

## 2022-05-02 ENCOUNTER — Inpatient Hospital Stay
Admit: 2022-05-02 | Discharge: 2022-05-07 | Disposition: A | Discharge status: Home / Self Care | Payer: PRIVATE HEALTH INSURANCE | Source: Home / Self Care

## 2022-05-02 DIAGNOSIS — R109 Unspecified abdominal pain: Secondary | ICD-10-CM

## 2022-05-02 DIAGNOSIS — R1084 Generalized abdominal pain: Secondary | ICD-10-CM

## 2022-05-02 LAB — Blood Culture Detection
BLOOD CULTURE FINAL STATUS: NEGATIVE
BLOOD CULTURE FINAL STATUS: NEGATIVE
BLOOD CULTURE FINAL STATUS: NEGATIVE
BLOOD CULTURE FINAL STATUS: NEGATIVE

## 2022-05-02 LAB — Comprehensive Metabolic Panel
ALANINE AMINOTRANSFERASE: 7 U/L — ABNORMAL LOW (ref 8–70)
BILIRUBIN,TOTAL: 0.3 mg/dL (ref 0.1–1.2)
TOTAL PROTEIN: 7.4 g/dL (ref 6.1–8.2)

## 2022-05-02 LAB — Differential Automated: ABSOLUTE LYMPHOCYTE COUNT: 1.72 10*3/uL (ref 1.30–3.40)

## 2022-05-02 LAB — Lipase: LIPASE: 94 U/L — ABNORMAL HIGH (ref 13–69)

## 2022-05-02 LAB — Basic Metabolic Panel
GLUCOSE: 87 mg/dL (ref 65–99)
UREA NITROGEN: 8 mg/dL (ref 7–22)

## 2022-05-02 LAB — CBC: MEAN CORPUSCULAR HEMOGLOBIN: 27.6 pg (ref 26.4–33.4)

## 2022-05-02 LAB — Expedited COVID-19 and Influenza A B PCR: COVID-19 PCR/TMA: NOT DETECTED

## 2022-05-02 MED ADMIN — SUCRALFATE 1 GM/10ML PO SUSP: 1 g | ORAL | @ 23:00:00 | Stop: 2022-06-01

## 2022-05-02 MED ADMIN — ONDANSETRON HCL 4 MG/2ML IJ SOLN: 4 mg | INTRAVENOUS | Stop: 2022-05-07 | NDC 60505613000

## 2022-05-02 MED ADMIN — HYDROMORPHONE HCL 1 MG/ML IJ SOLN: 1 mg | INTRAVENOUS | @ 20:00:00 | Stop: 2022-05-02 | NDC 00409128331

## 2022-05-02 MED ADMIN — CEFTRIAXONE 1 GM/50 ML RTU: 1 g | INTRAVENOUS | Stop: 2022-05-03 | NDC 00338500241

## 2022-05-02 MED ADMIN — FLUOXETINE HCL 20 MG PO CAPS: 40 mg | ORAL | @ 23:00:00 | Stop: 2022-05-07

## 2022-05-02 MED ADMIN — METRONIDAZOLE 500 MG/100ML IV SOLN: 500 mg | INTRAVENOUS | Stop: 2022-05-03 | NDC 00409015201

## 2022-05-02 MED ADMIN — ONDANSETRON HCL 4 MG/2ML IJ SOLN: 4 mg | INTRAVENOUS | @ 20:00:00 | Stop: 2022-05-02 | NDC 60505613000

## 2022-05-02 MED ADMIN — PANTOPRAZOLE SODIUM 40 MG PO TBEC: 40 mg | ORAL | @ 23:00:00 | Stop: 2022-06-01

## 2022-05-02 MED ADMIN — MULTI-VITAMINS PO TABS: 1 | ORAL | @ 23:00:00 | Stop: 2022-06-01

## 2022-05-02 MED ADMIN — METOCLOPRAMIDE HCL 5 MG PO TABS: 10 mg | ORAL | @ 23:00:00 | Stop: 2022-05-07

## 2022-05-02 MED ADMIN — SODIUM CHLORIDE 0.9 % IV BOLUS: 1000 mL | INTRAVENOUS | @ 17:00:00 | Stop: 2022-05-02 | NDC 00338004904

## 2022-05-02 MED ADMIN — METOCLOPRAMIDE HCL 10 MG PO TABS: 10 mg | ORAL | @ 23:00:00 | Stop: 2022-06-01

## 2022-05-02 MED ADMIN — ONDANSETRON HCL 4 MG/2ML IJ SOLN: 4 mg | INTRAVENOUS | @ 17:00:00 | Stop: 2022-05-02 | NDC 60505613000

## 2022-05-02 MED ADMIN — METHOCARBAMOL 750 MG PO TABS: 750 mg | ORAL | @ 23:00:00 | Stop: 2022-05-07

## 2022-05-02 MED ADMIN — IOHEXOL 350 MG/ML IV SOLN: 100 mL | INTRAVENOUS | @ 18:00:00 | Stop: 2022-05-02 | NDC 00407141491

## 2022-05-02 MED ADMIN — QUETIAPINE FUMARATE 100 MG PO TABS: 100 mg | ORAL | @ 23:00:00 | Stop: 2022-06-01

## 2022-05-02 MED ADMIN — HYDROMORPHONE HCL 1 MG/ML IJ SOLN: 1 mg | INTRAVENOUS | @ 17:00:00 | Stop: 2022-05-02 | NDC 00409128331

## 2022-05-02 MED ADMIN — OXYCODONE HCL 5 MG PO TABS: 5 mg | ORAL | @ 20:00:00 | Stop: 2022-05-03 | NDC 00406055262

## 2022-05-02 MED ADMIN — HYDROMORPHONE HCL 1 MG/ML IJ SOLN: .2 mg | INTRAVENOUS | Stop: 2022-05-03 | NDC 00409128331

## 2022-05-02 MED ADMIN — OXYCODONE HCL 5 MG PO TABS: 5 mg | ORAL | @ 20:00:00 | Stop: 2022-05-03

## 2022-05-02 NOTE — ED Provider Notes
[facility]  Emergency Department Service Report    Donna Gross 56 y.o. female , presents with Abdominal Pain      Triage   Arrived on 05/02/2022 at 8:12 AM   Arrived by Car [5]    ED Triage Vitals   Temp Temp Source BP Heart Rate Resp SpO2 O2 Device Pain Score Weight   05/02/22 0816 05/02/22 0816 05/02/22 0816 05/02/22 0816 05/02/22 0816 05/02/22 0816 05/02/22 0816 05/02/22 0823 05/02/22 0823   36.7 ?C (98 ?F) Oral 108/75 (!) 104 22 96 % None (Room air) Ten 54.4 kg (120 lb)       Pre hospital care:       Allergies   Allergen Reactions   ? Hydrocodone Other (See Comments)     ''burns a hole in stomach''  Tolerates hydromorphone   ? Ibuprofen Other (See Comments)     GI discomfort    ''hurts my stomach''   ? Morphine Itching     Tolerates hydromorphone       History   The history is provided by the patient. No language interpreter was used.      Patient is a 56 y.o. female with hx of anxiety, depression, emphysema, fibromyalgia, gastritis, GERD, colon surgery and pancreatitis who presents to the ED with complaint of abdominal pain s/p abdominal surgery on 5/3. Pt had a cholecystectomy on 4/8, was treated for an infection, and admitted from 5/3-5/8 for ERCP and biliary stent placement. Pt now endorses diarrhea, generalized weakness, and SOB exacerbated my moving and walking. Pt reports mild chest pain that is non radiating, but she attributes it to her gastritis. Denies cough, fever, and leg swelling. No known sick contacts.          Past Medical History:   Diagnosis Date   ? Anxiety    ? Depression    ? Emphysema, unspecified (HCC/RAF)    ? Emphysema, unspecified (HCC/RAF)    ? Fibromyalgia    ? Gastritis    ? GERD (gastroesophageal reflux disease)    ? Pancreatitis         Past Surgical History:   Procedure Laterality Date   ? ABDOMINAL SURGERY     ? COLON SURGERY          Past Family History   family history includes Diabetes in her brother and father; Heart disease in her mother. Past Social History   she reports that she has been smoking cigarettes. She uses smokeless tobacco. She reports that she does not drink alcohol, does not use drugs, and does not engage in sexual activity.       Physical Exam   Physical Exam  Vitals and nursing note reviewed.   Constitutional:       General: She is not in acute distress.     Appearance: Normal appearance. She is not ill-appearing, toxic-appearing or diaphoretic.   HENT:      Head: Normocephalic and atraumatic.      Nose: Nose normal.      Mouth/Throat:      Mouth: Mucous membranes are moist.   Eyes:      Extraocular Movements: Extraocular movements intact.      Conjunctiva/sclera: Conjunctivae normal.      Pupils: Pupils are equal, round, and reactive to light.   Cardiovascular:      Rate and Rhythm: Normal rate and regular rhythm.      Pulses: Normal pulses.      Heart sounds: Normal heart  sounds. No murmur heard.    No friction rub. No gallop.   Pulmonary:      Effort: Pulmonary effort is normal. Tachypnea present. No respiratory distress.      Breath sounds: Normal breath sounds. No wheezing, rhonchi or rales.   Abdominal:      General: Abdomen is flat.      Palpations: Abdomen is soft.      Tenderness: There is abdominal tenderness (diffuse). There is guarding (voluntary).   Musculoskeletal:         General: No swelling. Normal range of motion.      Cervical back: Normal range of motion and neck supple.   Skin:     General: Skin is warm and dry.      Capillary Refill: Capillary refill takes less than 2 seconds.   Neurological:      General: No focal deficit present.      Mental Status: She is alert and oriented to person, place, and time. Mental status is at baseline.         ED Course          Laboratory Results   Labs Reviewed - No data to display    Imaging Results     No orders to display       Administered Medications     Medication Administration from 05/02/2022 0812 to 05/02/2022 0906     None          Procedures   Procedural Sedation  Procedures    Medical Decision Making   [name] is a 56 y.o. female ***    MDM  Clinical Impression   No diagnosis found.      Prescriptions     New Prescriptions    No medications on file       Disposition and Follow-up   Disposition: Refresh note to pull in Disposition    Future Appointments   Date Time Provider Department Center   05/05/2022 12:15 PM Dineen Kid. [name], MD SUR GEN [address]       Follow up with:  No follow-up provider specified.    Return precautions are specified on After Visit Summary.    The documentation on this chart was performed by Holly Bodily, scribed for Floyde Parkins, MD    05/02/2022 9:14 AM     ***

## 2022-05-02 NOTE — ED Notes
Pt in stable with no distress noted. Pt resting in bed with no needs

## 2022-05-02 NOTE — H&P
Hospitalist History and Physical    Patient: Donna Gross  MRN: [id number]  Date of Service: 05/02/2022    Primary Care Provider: Kelle Darting, MD     Attending Physician: Susy Frizzle, MD MSc [phone]    Chief Complaint:    Chief Complaint   Patient presents with   ? Abdominal Pain     Pt reports abdominal pain, diarrhea, weakness and SOB ever since surgery, pt has been hospitalized for GI infections.   4/7-Lap Chole with LOA, 5/2-Endoscopic Retrograde with stent placement, pt has a follow up appointment on 5/19 but feels like she needs medical attention         The following history was generated by an interview with the patient as well as a review of available medical records.    History of Present Illness:   Donna Gross is a 56 y.o. female with hx of anxiety, depression, emphysema, fibromyalgia, gastritis, GERD, colon surgery and pancreatitis who presents to the ED with complaint of abdominal pain s/p abdominal surgery on 5/3. Pt had a cholecystectomy on 4/8, was treated for an infection, and admitted from 5/3-5/8 for ERCP and biliary stent placement. Pt now endorses diarrhea, generalized weakness, and SOB exacerbated my moving and walking.     Pt reports intermittent chest pain that is non radiating, but she attributes it to her gastritis. Denies cough, fever, and leg swelling. No known sick contacts. Lab work in [facility] ED notable for H/H 9.6/30.7, Plts 455K, Na 134, and Lipase 94. CT Abdomen pelvis obtained demonstrating signs of colitis, for which patient was initiated on empiric ceftriaxone/flagyl. CT imaging further confirming no signs of biliary free fluid/leak. No signs of abscess or fluid collection.     ?    Emergency Department Course:  ED Triage Vitals   Temp Temp Source BP Heart Rate Resp SpO2 O2 Device Pain Score Weight   05/02/22 0816 05/02/22 0816 05/02/22 0816 05/02/22 0816 05/02/22 0816 05/02/22 0816 05/02/22 0816 05/02/22 0823 05/02/22 0823   36.7 ?C (98 ?F) Oral 108/75 (!) 104 22 96 % None (Room air) Ten 54.4 kg (120 lb)     Medications   oxyCODONE tab 5 mg (5 mg Oral Not Given 05/02/22 1302)   lactated ringers IV soln (100 mL/hr Intravenous New Bag/ Syringe/ Cartridge 05/02/22 1700)   acetaminophen tab 650 mg (has no administration in time range)     Or   acetaminophen 650 mg supp 650 mg (has no administration in time range)   acetaminophen tab 650 mg (has no administration in time range)   oxyCODONE tab 5 mg (has no administration in time range)   oxyCODONE tab 10 mg (has no administration in time range)   melatonin tab 3 mg (has no administration in time range)   magnesium hydroxide 400 mg/5 mL susp 30 mL (has no administration in time range)   bisacodyl EC tab 5 mg (has no administration in time range)   senna tab 1 tablet (has no administration in time range)   ondansetron tab 4 mg ( Oral See Alternative 05/02/22 1657)     Or   ondansetron 4 mg/2 mL inj 4 mg (4 mg Intravenous Given 05/02/22 1657)   pantoprazole DR tab 40 mg (40 mg Oral Not Given 05/02/22 1602)   nicotine 21 mg/24 hr patch 21 mg (21 mg Transdermal Patch Applied 05/02/22 1711)   FLUoxetine cap 40 mg (40 mg Oral Not Given 05/02/22 1603)   fluticasone-salmeterol 250-50 mcg/act diskus inhaler 1 puff (  has no administration in time range)   ipratropium-albuterol 20-100 mcg/act inh 2 puff (has no administration in time range)   methocarbamol tab 750 mg (750 mg Oral Not Given 05/02/22 1603)   metoclopramide tab 10 mg (10 mg Oral Not Given 05/02/22 1602)   multivitamin tab 1 tablet (1 tablet Oral Not Given 05/02/22 1603)   QUEtiapine tab 100 mg (100 mg Oral Not Given 05/02/22 1601)   sucralfate 1 g/10 mL susp 1 g (1 g Oral Not Given 05/02/22 1725)   traZODone tab 100 mg (has no administration in time range)   HYDROmorphone 1 mg/mL inj 0.2 mg (0.2 mg IV Push Given 05/02/22 1656)   sodium chloride 0.9% IV soln bolus 1,000 mL (0 mLs Intravenous Stopped 05/02/22 1228)   ondansetron 4 mg/2 mL inj 4 mg (4 mg Intravenous Given 05/02/22 0954)   HYDROmorphone 1 mg/mL inj 1 mg (1 mg IV Push Given 05/02/22 0954)   iohexol (Omnipaque) 350 mg/mL inj 100 mL (100 mLs Intravenous Given 05/02/22 1039)   HYDROmorphone 1 mg/mL inj 1 mg (1 mg IV Push Given 05/02/22 1321)   ondansetron 4 mg/2 mL inj 4 mg (4 mg Intravenous Given 05/02/22 1322)   cefTRIAXone 1 g in dextrose 50 mL IVPB RTU (1 g Intravenous New Bag/ Syringe/ Cartridge 05/02/22 1653)   metroNIDAZOLE 500 mg in sodium chloride 100 mL IVPB RTU (500 mg Intravenous New Bag/ Syringe/ Cartridge 05/02/22 1653)      Case discussed with ED physician.     Past Medical History:  Past Medical History:   Diagnosis Date   ? Anxiety    ? Depression    ? Emphysema, unspecified (HCC/RAF)    ? Emphysema, unspecified (HCC/RAF)    ? Fibromyalgia    ? Gastritis    ? GERD (gastroesophageal reflux disease)    ? Pancreatitis        Past Surgical History:  Past Surgical History:   Procedure Laterality Date   ? ABDOMINAL SURGERY     ? COLON SURGERY         Medications:  No outpatient medications have been marked as taking for the 05/02/22 encounter [facility] Encounter).       Allergies:  Hydrocodone, Ibuprofen, and Morphine    Social History:   She reports that she has been smoking cigarettes. She uses smokeless tobacco. She reports that she does not drink alcohol and does not use drugs.      Family History:  Her family history includes Diabetes in her brother and father; Heart disease in her mother.    ROS:   (+) A 14-point review of systems was completed and was negative other than what is presented in the HPI.    Vitals:  BP 149/91 (BP Location: Left arm, Patient Position: Sitting)  ~ Pulse 74  ~ Temp 36.6 ?C (97.9 ?F) (Oral)  ~ Resp 16  ~ Ht 1.626 m (5' 4'')  ~ Wt 54.4 kg (120 lb)  ~ SpO2 94%  ~ BMI 20.60 kg/m?     Physical Exam:  Gen: Pleasant female, no apparent distress  Eyes: PERRL, sclerae anicteric  HENT: atraumatic, normocephalic, clear oropharynx, moist mucous membranes  Neck: Trachea midline. supple, no cervical or supraclavicular LAD  Cardiovascular: RRR, normal S1 and S2, no murmurs, rubs or gallops. JVP~6cm. 2+ distal pulses bilaterally.  No edema.   Respiratory: CTAB, no wheezes or rhonchi, no use of accessory muscles  GI: abdominal tenderness, +bowel sounds, soft, NT, ND. No rebound, guarding,  or hepatosplenomegaly  MSK: extremities warm and well-perfused without clubbing or cyanosis  Skin: no rashes, ulcers visualized. normal skin turgor  Psych: alert, oriented x 3. Appropriate mood and affect  Neuro: CN II-XII intact. Sensation of upper and lower extremities normal      Labs: Personally reviewed.  Lab Results   Component Value Date    NA 134 (L) 05/02/2022    K 4.3 05/02/2022    CL 103 05/02/2022    CO2 18 (L) 05/02/2022    ANIONGAP 13 05/02/2022    BUN 8 05/02/2022    CREAT 1.02 05/02/2022    CALCIUM 8.6 05/02/2022    MG 1.7 04/27/2022    PHOS 3.5 04/27/2022     Lab Results   Component Value Date    ALT 7 (L) 05/02/2022    AST 27 05/02/2022    ALKPHOS 119 (H) 05/02/2022    BILITOT 0.3 05/02/2022     Lab Results   Component Value Date    WBC 6.32 05/02/2022    HGB 9.6 (L) 05/02/2022    HCT 30.7 (L) 05/02/2022    MCV 88.2 05/02/2022    PLT 455 (H) 05/02/2022     Lab Results   Component Value Date    INR 1.0 04/08/2021     No results found for: HGBA1C  No results found for: TSH    Studies: Personally reviewed.    CT Abdomen Pelvis w/ Contrast 05/02/2022     1.  Interval placement of a biliary stent. No evidence of free fluid in the gallbladder fossa to suggest biliary leak. No evidence of abscess.  2.  Mild bowel wall thickening of the transverse colon. Findings may be secondary to underdistention versus colitis. Recommend clinical correlation.      Assessment:    Donna Gross is a 56 y.o. female with hx of anxiety, depression, emphysema, fibromyalgia, gastritis, GERD, colon surgery and pancreatitis who presents to the ED with complaint of abdominal pain s/p abdominal surgery on 5/3. Pt had a cholecystectomy on 4/8, was treated for an infection, and admitted from 5/3-5/8 for ERCP and biliary stent placement and now found to have signs of colitis.    ACTIVE  #Abdominal Pain, POA   #Colitis, POA   Patient recently underwent CT imaging 2/2 recent stent placement w/ no findings of free air, stent migration, or bile leak. No abscess noted. Colitis suspected given thickening of bowel wall in the transverse colon.   - ADAT   - mIVF at 100 cc/hr   - Tylenol/Oxy/Dilaudid for pain   - Senna/Miralax PRN   - Initiate IV Ceftriaxone (5/13-)   - Initiate IV Flagyl (5/13-)   - Defer C/S to General Surgery at this time given lack of post surgical findings related to biliary tree  - C/S to Physician Nutrition Services   - Zofran 4 mg q8h PRN  + regal 10 m gTID before meals   - Methocarbamol TID  - Continue Protonix 40 mg qDay   - Continue Sucralfate 1 g TID     CHRONIC/STABLE  #Fibromyalgia - Continue Fluoxetine 40 mg qday   #Insomnia, POA - Continue Seroquel 100 mg BID + Trazodone 100 mg at bedtime, Melatonin also available as PRN   #Tobacco Dependence - Nicotine patch     #FEN/GI:  CLD, ADAT  #Prophylaxis: SCD's, Protonix  #Dispo: Admit to Hospitalist; Non-monitored level of care   #Code Status: Full Code   Primary Emergency Contact: [name],[name]  Signed: Susy Frizzle, MD MSc [phone]

## 2022-05-02 NOTE — ED Notes
COLLECTIVE?NOTIFICATION?05/02/2022 08:12?Donna Gross?MRN: [id number]    [facility] Donna Gross's patient encounter information:   IHK:[id number]  Account [id number]  Billing Account [id number]      Criteria Met      6 Visits in 180 Days    History of Sepsis Dx    Security and Safety  No Security Events were found.  ED Care Guidelines  There are currently no ED Care Guidelines for this patient. Please check your facility's medical records system.    Flags      History of Sepsis - Patient has received a diagnosis of Sepsis from an acute or post-acute setting. Apply appropriate clinical planning practices; to learn more visit [email or web address] / Attributed By: [employer] / Attributed On: 04/09/2022         Prescription Drug Data  No Prescription Drug Data was found.    E.D. Visit Count (12 mo.)  Facility Visits   Donna Gross [facility] 4   [facility] Donna Gross [facility] 2   Donna Gross [place]   Total 9   Note: Visits indicate total known visits.     Recent Emergency Department Visit Summary  Date Facility [facility] Type Diagnoses or Chief Complaint    May 02, 2022  [facility].  [place]     Apr 01, 2022  [facility]  [place] Gross.  [place]  Chief Complaint: ABDOMINAL PAIN    Feb 13, 2022  [facility].  [place]      1. Calculus of gallbladder without cholecystitis without obstruction      3. Abdominal Pain      Dec 19, 2021  [facility].  [place]      1. Unspecified abdominal pain      2. Abdominal Pain      Dec 16, 2021  [place] Gross  [place]  Chief Complaint: Donna Gross AND ABD PAIN    Nov 08, 2021  [place]  Chief Complaint: CHEST PAIN    Nov 02, 2021  [place]  Chief Complaint: CHEST PAIN, AB PAIN ,BACK PAIN, VOMITING    Aug 07, 2021  [place]  Chief Complaint: CHEST PAIN/ AB PAIN/ VOMITING    Jul 30, 2021  [facility]  [place].  [place]      Chronic obstructive pulmonary disease, unspecified      Benign paroxysmal vertigo, unspecified ear      Dizziness and giddiness      Allergy status to analgesic agent      Gastro-esophageal reflux disease without esophagitis      Personal history of other diseases of the digestive system      Anxiety disorder, unspecified      Depression, unspecified      Allergy status to narcotic agent        Recent Inpatient Visit Summary  Date Facility [facility] Type Diagnoses or Chief Complaint    Apr 25, 2022  [facility].  [place]      1. Nausea with vomiting, unspecified      2. Unspecified complication of procedure, initial encounter      3. Disease of biliary  tract, unspecified      4. Other bacterial infections of unspecified site      4. Peritoneal abscess      5. Extended spectrum beta lactamase (ESBL) resistance      7. Epigastric pain      8. Other disorders of electrolyte and fluid balance, not elsewhere classified      9. Unspecified abdominal pain      10. Abdominal Pain      Apr 01, 2022  [facility]  Don[place] Gross.  [place]  Inpatient      Unspecified intestinal obstruction, unspecified as to partial versus complete obstruction      Sepsis, unspecified organism      Hypo-osmolality and hyponatremia      Dehydration      Diaphragmatic hernia without obstruction or gangrene      Acquired absence of other specified parts of digestive tract      Other acute postprocedural pain      Hypokalemia      Peritoneal abscess      Gastro-esophageal reflux disease without esophagitis      Mar 27, 2022  [facility].  [place]  [place]gery      2. Pruritus, unspecified      3. Calculus of gallbladder without cholecystitis without obstruction      Nov 08, 2021  Don[place] Gross  [place]  [place]  Chief Complaint: ACUTE PANCREATITIS    Aug 07, 2021  Don[place] Gross  [place]  Chief Complaint: CHEST PAIN/ AB PAIN/ VOMITING      Care Team  [facility] Phone Fax Service Dates   Sela Hilding, M.D. Family Medicine   Current      This patient has registered at the [facility] Emergency Department   For more information visit: [email or web address]   PLEASE NOTE:     1.   Any care recommendations and other clinical information are provided as guidelines or for historical purposes only, and providers should exercise their own clinical judgment when providing care.    2.   You may only use this information for purposes of treatment, payment or health care operations activities, and subject to the limitations of applicable [employer].    3.   You should consult directly with the organization that provided a care guideline or other clinical history with any questions about additional information or accuracy or completeness of information provided.    ? 2023 [place] - [email or web address]

## 2022-05-02 NOTE — ED Notes
Pt resting in bed. Pt has pain relief and in being transported to CT

## 2022-05-02 NOTE — ED Notes
Report given. Organizing transport. Pt in stable condition. No acute distress noted. Pt resting

## 2022-05-02 NOTE — ED Notes
Pt resing in bed. No acute distress noted. Pt has no pain and vss. Pt po challenged.

## 2022-05-02 NOTE — ED Notes
No pain noted

## 2022-05-03 DIAGNOSIS — K529 Noninfective gastroenteritis and colitis, unspecified: Secondary | ICD-10-CM

## 2022-05-03 LAB — Hepatic Funct Panel: ASPARTATE AMINOTRANSFERASE: 17 U/L (ref 13–62)

## 2022-05-03 LAB — Phosphorus: PHOSPHORUS: 3.4 mg/dL (ref 2.3–4.4)

## 2022-05-03 LAB — Magnesium: MAGNESIUM: 1.3 meq/L — ABNORMAL LOW (ref 1.4–1.9)

## 2022-05-03 LAB — CBC: PLATELET COUNT, AUTO: 457 10*3/uL — ABNORMAL HIGH (ref 143–398)

## 2022-05-03 LAB — Basic Metabolic Panel
ESTIMATED GFR 2021 CKD-EPI: 81 mL/min/{1.73_m2} (ref 65–99)
GLUCOSE: 70 mg/dL (ref 65–99)

## 2022-05-03 LAB — Differential Automated: NEUTROPHIL PERCENT, AUTO: 44.8 (ref 1.80–6.90)

## 2022-05-03 MED ADMIN — FLUOXETINE HCL 20 MG PO CAPS: 40 mg | ORAL | @ 19:00:00 | Stop: 2022-05-07

## 2022-05-03 MED ADMIN — PANTOPRAZOLE SODIUM 40 MG PO TBEC: 40 mg | ORAL | @ 17:00:00 | Stop: 2022-06-01

## 2022-05-03 MED ADMIN — METHOCARBAMOL 750 MG PO TABS: 750 mg | ORAL | @ 03:00:00 | Stop: 2022-06-01

## 2022-05-03 MED ADMIN — METOCLOPRAMIDE HCL 5 MG/ML IJ SOLN: 5 mg | INTRAVENOUS | @ 20:00:00 | Stop: 2022-06-02 | NDC 00409341401

## 2022-05-03 MED ADMIN — METRONIDAZOLE 500 MG/100ML IV SOLN: 500 mg | INTRAVENOUS | Stop: 2022-05-05 | NDC 00409015201

## 2022-05-03 MED ADMIN — METOCLOPRAMIDE HCL 5 MG PO TABS: 10 mg | ORAL | @ 19:00:00 | Stop: 2022-05-07

## 2022-05-03 MED ADMIN — LACTATED RINGERS IV SOLN: 100 mL/h | INTRAVENOUS | Stop: 2022-05-03 | NDC 00338011704

## 2022-05-03 MED ADMIN — METOCLOPRAMIDE HCL 5 MG/ML IJ SOLN: 5 mg | INTRAVENOUS | @ 11:00:00 | Stop: 2022-06-02 | NDC 00409341401

## 2022-05-03 MED ADMIN — HYDROMORPHONE HCL 1 MG/ML IJ SOLN: .2 mg | INTRAVENOUS | @ 07:00:00 | Stop: 2022-05-05 | NDC 00409128331

## 2022-05-03 MED ADMIN — FLUTICASONE-SALMETEROL 250-50 MCG/ACT IN AEPB: 1 | RESPIRATORY_TRACT | @ 16:00:00 | Stop: 2022-05-07 | NDC 00054032756

## 2022-05-03 MED ADMIN — NICOTINE 21 MG/24HR TD PT24: 21 mg | TRANSDERMAL | @ 17:00:00 | Stop: 2022-05-07 | NDC 00536589688

## 2022-05-03 MED ADMIN — METOCLOPRAMIDE HCL 5 MG/ML IJ SOLN: 5 mg | INTRAVENOUS | @ 03:00:00 | Stop: 2022-05-07 | NDC 00409341401

## 2022-05-03 MED ADMIN — SUCRALFATE 1 GM/10ML PO SUSP: 1 g | ORAL | @ 03:00:00 | Stop: 2022-05-07

## 2022-05-03 MED ADMIN — HYDROMORPHONE HCL 1 MG/ML IJ SOLN: .2 mg | INTRAVENOUS | @ 23:00:00 | Stop: 2022-05-05 | NDC 00409128331

## 2022-05-03 MED ADMIN — METOCLOPRAMIDE HCL 5 MG PO TABS: 10 mg | ORAL | @ 12:00:00 | Stop: 2022-05-07

## 2022-05-03 MED ADMIN — MAGNESIUM SULFATE 2 GM/50ML IV SOLN: 2 g | INTRAVENOUS | @ 17:00:00 | Stop: 2022-05-03 | NDC 25021061281

## 2022-05-03 MED ADMIN — HYDROMORPHONE HCL 1 MG/ML IJ SOLN: .2 mg | INTRAVENOUS | @ 03:00:00 | Stop: 2022-05-05 | NDC 00409128331

## 2022-05-03 MED ADMIN — METHOCARBAMOL 750 MG PO TABS: 750 mg | ORAL | @ 21:00:00 | Stop: 2022-06-01

## 2022-05-03 MED ADMIN — NICOTINE 21 MG/24HR TD PT24: 21 mg | TRANSDERMAL | @ 17:00:00 | Stop: 2022-06-01

## 2022-05-03 MED ADMIN — FLUTICASONE-SALMETEROL 250-50 MCG/ACT IN AEPB: 1 | RESPIRATORY_TRACT | @ 06:00:00 | Stop: 2022-05-07

## 2022-05-03 MED ADMIN — HYDROMORPHONE HCL 1 MG/ML IJ SOLN: .2 mg | INTRAVENOUS | @ 15:00:00 | Stop: 2022-05-05 | NDC 00409128331

## 2022-05-03 MED ADMIN — SUCRALFATE 1 GM/10ML PO SUSP: 1 g | ORAL | Stop: 2022-05-07

## 2022-05-03 MED ADMIN — METHOCARBAMOL 750 MG PO TABS: 750 mg | ORAL | @ 12:00:00 | Stop: 2022-06-01

## 2022-05-03 MED ADMIN — MULTI-VITAMINS PO TABS: 1 | ORAL | @ 19:00:00 | Stop: 2022-05-07

## 2022-05-03 MED ADMIN — QUETIAPINE FUMARATE 100 MG PO TABS: 100 mg | ORAL | @ 03:00:00 | Stop: 2022-05-07

## 2022-05-03 MED ADMIN — SUCRALFATE 1 GM/10ML PO SUSP: 1 g | ORAL | @ 17:00:00 | Stop: 2022-05-07

## 2022-05-03 MED ADMIN — HYDROMORPHONE HCL 1 MG/ML IJ SOLN: .2 mg | INTRAVENOUS | @ 11:00:00 | Stop: 2022-05-05 | NDC 00409128331

## 2022-05-03 MED ADMIN — SUCRALFATE 1 GM/10ML PO SUSP: 1 g | ORAL | @ 21:00:00 | Stop: 2022-05-07

## 2022-05-03 MED ADMIN — ONDANSETRON HCL 4 MG/2ML IJ SOLN: 4 mg | INTRAVENOUS | @ 08:00:00 | Stop: 2022-05-07 | NDC 60505613000

## 2022-05-03 MED ADMIN — ONDANSETRON HCL 4 MG/2ML IJ SOLN: 4 mg | INTRAVENOUS | @ 23:00:00 | Stop: 2022-05-07 | NDC 60505613000

## 2022-05-03 MED ADMIN — ONDANSETRON HCL 4 MG/2ML IJ SOLN: 4 mg | INTRAVENOUS | @ 15:00:00 | Stop: 2022-05-07 | NDC 60505613000

## 2022-05-03 MED ADMIN — TRAZODONE HCL 100 MG PO TABS: 100 mg | ORAL | @ 03:00:00 | Stop: 2022-05-07

## 2022-05-03 MED ADMIN — NICOTINE 21 MG/24HR TD PT24: 21 mg | TRANSDERMAL | Stop: 2022-05-07 | NDC 00536589688

## 2022-05-03 MED ADMIN — LACTATED RINGERS IV SOLN: 100 mL/h | INTRAVENOUS | @ 23:00:00 | Stop: 2022-05-04 | NDC 00338011704

## 2022-05-03 MED ADMIN — HYDROMORPHONE HCL 1 MG/ML IJ SOLN: .2 mg | INTRAVENOUS | @ 19:00:00 | Stop: 2022-05-06 | NDC 00409128331

## 2022-05-03 MED ADMIN — QUETIAPINE FUMARATE 100 MG PO TABS: 100 mg | ORAL | @ 17:00:00 | Stop: 2022-05-07

## 2022-05-03 MED ADMIN — LACTATED RINGERS IV SOLN: 100 mL/h | INTRAVENOUS | @ 11:00:00 | Stop: 2022-05-03 | NDC 00338011704

## 2022-05-03 NOTE — Progress Notes
INTERNAL MEDICINE - INPATIENT PROGRESS NOTE    DATE OF SERVICE: 05/03/2022   HOSPITAL DAY: 0    CC: Abdominal Pain (Pt reports abdominal pain, diarrhea, weakness and SOB ever since surgery, pt has been hospitalized for GI infections. /4/7-Lap Chole with LOA, 5/2-Endoscopic Retrograde with stent placement, pt has a follow up appointment on 5/19 but feels like she needs medical attention /)    INTERVAL EVENTS AND REVIEW OF SYSTEMS:  -Admitted yesterday for acute abdominal pain, ongoing x 5 days, lower abdomen, severe, associated with episode of diarrhea daily and poor PO intake  -Improvement in pain with IV dilaudid    INTERVAL RECORDS:  I have reviewed the interval physician notes, nursing notes, and allied health professional notes over the last 24 hours.    MEDICATIONS:  cefTRIAXone, 1 g, Intravenous, Q24H  [START ON 05/04/2022] enoxaparin, 40 mg, Subcutaneous, Daily  FLUoxetine, 40 mg, Oral, Daily  fluticasone-salmeterol, 1 puff, Inhalation, BID  methocarbamol, 750 mg, Oral, TID  metoclopramide, 10 mg, Oral, TID AC  metroNIDAZOLE, 500 mg, Intravenous, Q8H  multivitamin, 1 tablet, Oral, Daily  nicotine, 1 patch, Transdermal, Daily  pantoprazole, 40 mg, Oral, Daily  QUEtiapine, 100 mg, Oral, BID  sucralfate, 1 g, Oral, TID w/meals & QHS  traZODone, 100 mg, Oral, QHS  PRNs: acetaminophen **OR** acetaminophen suppository, acetaminophen, bisacodyl, HYDROmorphone, ipratropium-albuterol, magnesium hydroxide, melatonin oral/enteral/sublingual, metoclopramide, ondansetron **OR** ondansetron injection/IVPB, oxyCODONE, oxyCODONE, senna    VITALS:  Temp:  [36.6 ?C (97.9 ?F)-37.4 ?C (99.4 ?F)] 36.8 ?C (98.2 ?F)  Heart Rate:  [67-92] 67  Resp:  [16-17] 16  BP: (113-149)/(70-91) 145/78  NBP Mean:  [80-108] 99  SpO2:  [93 %-94 %] 93 %     Weight: 54.4 kg (120 lb) Oxygen Therapy  SpO2: 93 %  O2 Device: None (Room air)     IN'S AND OUT'S:  I/O last 2 completed shifts:  In: 1150 [I.V.:100; IV Piggyback:1050]  Out: -     PHYSICAL EXAM:  General: alert, well appearing, and in no distress.  Oropharynx: mucous membranes moist, oropharynx clear   Neck: supple, no significant adenopathy  Cardiac: Regular rate and rhythm, no murmurs/rubs/gallops.   Lungs: Clear to auscultation with normal work of breathing  Abdomen: soft, nondistended, midline surgical scars noted, generalized TTP  Extremities: Warm, well perfused. No LE edema  Skin: warm and dry, no significant rashes or lesions  Neuro: No focal deficits    LABS:  I have reviewed the pertinent laboratory data.    CBC  Recent Labs     05/03/22  0526 05/02/22  1045   WBC 5.92 6.32   HGB 9.0* 9.6*   HCT 28.4* 30.7*   MCV 87.1 88.2   PLT 457* 455*     BMP  Recent Labs     05/03/22  0526 05/02/22  1302 05/02/22  0918   NA 138 134* 134*   K 3.9 4.3 4.7   CL 105 103 101   CO2 21 18* 17*   BUN 5* 8 10   CREAT 0.84 1.02 1.14   CALCIUM 8.6 8.6 9.6   MG 1.3*  --   --    PHOS 3.4  --   --      LFT  Recent Labs     05/03/22  0526 05/02/22  0918   TOTPRO 5.9* 7.4   ALBUMIN 3.2* 3.9   BILITOT 0.3 0.3   BILICON <0.2  --    ALT <5* 7*   AST 17  27   ALKPHOS 94 119*   LIPASE  --  94*     Coags  No results for input(s): INR, PT, APTT in the last 72 hours.    MICRO:  5/13 BCx NGTD    IMAGING AND STUDIES:  I have reviewed the pertinent imaging data.    No imaging has been resulted in the last 24 hours  ASSESSMENT/PLAN:  Donna Gross is a 56 y.o. female with PMHx PUD c/b GOO s/p multiple surgeries including vagotomy, hernia repair, colon perforation s/p colostomy and reversal, gallstone pancreatitis s/p lap cholecystectomy (03/27/2022) c/b fluid collection s/p biliary drain, ERCP and biliary stent placement (04/22/2022), anxiety/depression, emphysema, fibromyalgia who presented with acute abdominal pain, found with colitis.     #Acute abdominal Pain, POA  #Colitis, POA  #Hx multiple abd surgeries, POA  #Hx recent lab chole (03/27/2022) c/b fluid collection s/p biliary drain, POA  #Hx recent biliary stent (04/22/2022), POA  - Patient recently underwent CT imaging 2/2 recent stent placement w/ no findings of free air, stent migration, or bile leak. No abscess noted. Colitis suspected given thickening of bowel wall in the transverse colon- does appear new compared to prior recent CT  - Defer surgery and GI consults for now given imaging above  - Cont IV CTX, flagyl (5/13-)  - Clears and ADAT  - mIVF at 100 cc/hr   - Tylenol/Oxy/Dilaudid for pain  - Zofran 4 mg q8h PRN  + regal 10 mg TID before meals  - Senna/Miralax PRN  - C/S to Physician Nutrition Services  - Methocarbamol TID  - Ova/Parasite Studies  - Bacterial enteropathogen panel  - Parasitic enteropathogen panel  - Continue Protonix 40 mg qDay  - Continue Sucralfate 1 g TID  ?  #HypoMg, POA  - IV Mg repletion, trend    CHRONIC/STABLE:  #Anxiety, POA   #Depression, POA - see treatment modalities below   #Fibromyalgia - Continue Fluoxetine 40 mg qday   #Insomnia, POA - Continue Seroquel 100 mg BID + Trazodone 100 mg at bedtime, Melatonin also available as PRN   #Tobacco Dependence - Nicotine patch     I have seen and examined the patient and agree with the RD assessment detailed below:  Patient meets criteria for: Severe protein calorie malnutrition    (current weight 54.4 kg (120 lb), BMI (Calculated): 20.6; IBW: 54.4 kg (120 lb), % Ideal Body Weight: 100 %). See RD notes for additional details.    INPATIENT CHECKLIST  VTE Prophylaxis: enoxaparin  Central Lines: none. Tubes/Drains: none. Airways: none  Diet: Diet clear liquid  Activity: as tolerated  Precautions: No infectious isolation, standard precautions.    ADVANCED DIRECTIVES:  Full Code, Primary Emergency Contact: [name],[name]    DISPOSITION:  Observation, Non-Monitored. Expected post-hospitalization disposition will be to home.    AUTHOR:  Isom Kochan B. Joan Mayans, MD  05/03/2022 at 3:19 PM  [phone]

## 2022-05-03 NOTE — Other
Patient's Clinical Goal: pain control  Clinical Goal(s) for the Shift: comfort  Identify possible barriers to advancing the care plan: none  Stability of the patient: Moderately Unstable - medium risk of patient condition declining or worsening    Progression of Patient's Clinical Goal:   Patient arrived at 1830, VSS, RA, skin intact, BMATx4, contx2. Plan to collect stool sample and keep on contact isolation to rule our c-diff. Call light in reach and bed alarm on.

## 2022-05-03 NOTE — Consults
NUTRITION ASSESSMENT (Adult)    Admit Date:      Date of Birth: Mar 20, 1966 Gender: female MRN: [id number]     Date of Assessment: 05/03/2022   Status: Consult   Indication: Weight loss > 5% in 1 month   Subjective: Spoke to pt at bedside. Pt reports a 30lb weight loss in 1 month with UBW 154lb. Weight loss started after having her gallbladder removed. Appetite became decreased/poor. Pt was following a regular diet at home with typical intake of 2 meals per day, but only able to eat about half a sandwich at a time. Pt reports a dairy intolerance. Dairy causes nausea and abdominal irritation. Pt likes boost breeze, and states she has been able to tolerate during past admissions. Pt takes vitamin B12, vitamin D, and a MVI at home. Pt reports swallowing difficulty with food. Small bites, chewing well, and sips of water only helped some. Tough meats are most difficult to swallow. Nausea manageable with medication, but pt reports constant nausea. Diarrhea currently for the last week and a lot of abdominal pain. Not trying any of the liquids due to severe pain after all liquids.   Problems: Principal Problem:    Abdominal pain POA: Yes       MD HP: 56 y.o. female with hx of anxiety, depression, emphysema, fibromyalgia, gastritis, GERD, colon surgery and pancreatitis?who presents to the ED with complaint of abdominal pain s/p abdominal surgery on 5/3. Pt had a cholecystectomy on 4/8, was treated for an infection, and admitted from 5/3-5/8 for ERCP and biliary stent placement. Pt now endorses diarrhea, generalized weakness, and SOB exacerbated my moving and walking.  ?   Pt reports intermittent chest pain that is non radiating, but she attributes it to her gastritis. Denies cough, fever, and leg swelling. No known sick contacts. Lab work in [facility] ED notable for H/H 9.6/30.7, Plts 455K, Na 134, and Lipase 94. CT Abdomen pelvis obtained demonstrating signs of colitis, for which patient was initiated on empiric ceftriaxone/flagyl. CT imaging further confirming no signs of biliary free fluid/leak. No signs of abscess or fluid collection.     Past Medical History:   Diagnosis Date   ? Anxiety    ? Depression    ? Emphysema, unspecified (HCC/RAF)    ? Emphysema, unspecified (HCC/RAF)    ? Fibromyalgia    ? Gastritis    ? GERD (gastroesophageal reflux disease)    ? Pancreatitis     Past Surgical History:   Procedure Laterality Date   ? ABDOMINAL SURGERY     ? COLON SURGERY             Data   Intake/Outputs: I/O last 2 completed shifts:  In: 1150 [I.V.:100; IV Piggyback:1050]  Out: -     Pertinent Medications:   Scheduled Meds:  ? FLUoxetine  40 mg Oral Daily   ? fluticasone-salmeterol  1 puff Inhalation BID   ? methocarbamol  750 mg Oral TID   ? metoclopramide  10 mg Oral TID AC   ? multivitamin  1 tablet Oral Daily   ? nicotine  1 patch Transdermal Daily   ? pantoprazole  40 mg Oral Daily   ? QUEtiapine  100 mg Oral BID   ? sucralfate  1 g Oral TID w/meals & QHS   ? traZODone  100 mg Oral QHS     Continuous Infusions:  PRN Meds:.acetaminophen **OR** acetaminophen suppository, acetaminophen, bisacodyl, HYDROmorphone, ipratropium-albuterol, magnesium hydroxide, melatonin oral/enteral/sublingual, metoclopramide, ondansetron **OR** ondansetron injection/IVPB, oxyCODONE,  oxyCODONE, senna    FDI Target Drugs: No      Pertinent Labs:   Recent Labs     05/03/22  0526 05/02/22  1045 05/02/22  0918   NA 138   < > 134*   K 3.9   < > 4.7   BUN 5*   < > 10   CREAT 0.84   < > 1.14   GLUCOSE 70   < > 77   MG 1.3*  --   --    CALCIUM 8.6   < > 9.6   PHOS 3.4  --   --    ALBUMIN 3.2*  --  3.9   WBC 5.92   < >  --    HGB 9.0*   < >  --    HCT 28.4*   < >  --    BILITOT 0.3  --  0.3   AST 17  --  27   ALT <5*  --  7*   ALKPHOS 94  --  119*   LIPASE  --   --  94*    < > = values in this interval not displayed.     Accu-Chek: No results found in last 72 hours    Respiratory Status / O2 Device: None (Room air)    Pressure Injury:     Patient Lines/Drains/Airways Status Active Pressure Ulcers     None              Additional data:  No edema noted     Diet Info   ? Allergies:   Hydrocodone, Ibuprofen, and Morphine  ? Cultural/Ethnic/Religious/Other Food Preferences:  Yes No tough meats. Dairy intolerance   ? Nutrition prior to admit:  Regular diet. 2 meals per day, but po intake greatly decreased. Takes vitamin B12, vitamin D, and MVI daily  ? Current diet order:     Diets/Supplements/Feeds   Diet    Diet clear liquid     Start Date/Time: 05/02/22 1440      Number of Occurrences: Until Specified     ? PO % consumed:  (No documentation)  ? Parenteral Nutrition: n/a  ? Enteral Nutrition: n/a  ? Other caloric sources: n/a    Anthropometrics:  Height: 162.6 cm (5' 4'')  Admit Weight: 54.4 kg (120 lb) (05/02/22 0823) Last 5 recorded weights:  Weights 02/13/2022 02/13/2022 03/27/2022 04/21/2022 05/02/2022   Weight 67.6 kg 64.1 kg 64.4 kg 59.6 kg 54.4 kg            IBW: 54.4 kg (120 lb)  % Ideal Body Weight: 100 %  BMI (Calculated): 20.6    Usual Weight: 69.9 kg (154 lb)  % Usual Weight: 78 %     Wt Readings from Last 10 Encounters:   05/02/22 54.4 kg (120 lb)   04/21/22 59.6 kg (131 lb 6.3 oz)   03/27/22 64.4 kg (141 lb 15.6 oz)   02/13/22 64.1 kg (141 lb 6.4 oz)   01/30/22 68.9 kg (151 lb 12.8 oz)   12/19/21 68 kg (150 lb)   04/08/21 67.6 kg (149 lb)   03/18/21 66.7 kg (147 lb)   02/24/21 66.7 kg (147 lb)   02/06/21 66.8 kg (147 lb 5.1 oz)      Estimated Nutrition Needs   Based on CBW 54.5kg  1635-1908 kcal (30-35 kcal/kg)  54-82g protein (1-1.5 g/kg)     Diet Education   Teaching provided (Refer to Patient Education records)  5/14: ONS options and the importance of trial of food as abdominal pain improves     Malnutrition Assessment using AND/ASPEN Consensus Criteria    Malnutrition in the Context of: Mild-moderate inflammation/chronic disease   Energy Intake: <75% of estimated energy requirement for > or = 1 month; Supportive data: 2 small meals per day  Weight Loss: > 5% in 1 month; Supportive data: 16% in 1 month   05/02/22 54.4 kg (120 lb)   03/27/22 64.4 kg (141 lb 15.6 oz)     Nutrition-Focused Physical Exam: 05/03/2022  Subcutaneous Fat Loss: Severe  Areas examined/observed: Orbital Upper  Muscle Loss: Severe  Areas examined/observed: Clavicle, Deltoid, Interosseous   Muscle Loss: Moderate  Areas examined/observed: Thigh        Patient meets criteria for: Severe protein calorie malnutrition     Nutrition Assessment   Anthropometrics: Body mass index is 20.6 kg/m?Marland Kitchen Pt reports a 30lb weight loss in 1 month with UBW 154lb. Weight loss started after having her gallbladder removed. Pt with significant weight loss of 16% in 1 month. Pt has been losing weight over the last 2 months.    Nutrition: Appetite became decreased/poor. Pt was following a regular diet at home with typical intake of 2 meals per day, but only able to eat about half a sandwich at a time. Not trying any of the CLD due to severe pain/intolerance.    Tolerance/GI: Per RN flowsheet: Abdomen Inspection: Soft, Nondistended. LBM 5/13    Skin: skin integrity intact    Labs: low BUN, Mg, ALT  Glucose 70    Meds: MVI tab, ppi, sucralfate, abx, hydromorphone, mg hydroxide, ondansetron, senna     Recommendations / Care Plan      1. ADAT to Low Fiber Diet  2. Boost Breeze TID, Trial of [employer] when diet advanced    3.  Rec'd checking vitamin B1 (thiamine), vitamin D, and vitamin B12  -if thiamine levels low, rec'd: 300 mg IV thiamine in NS 50-100 ml x3-5 days then follow w/ 100 mg PO thiamine TID ongoing  -if D <30, add Vitamin D2 50,000 International Units 1x/wk x12wks (or Vitamin D3 2,500 International units daily x12wks) then recheck. If low, repeat dosing. (Always hold Vitamin D supplement if CorriCa >1.29, or PO4 >5.5)    4. High risk of refeeding syndrome   Monitor phos, magnesium and K+ as nutrition support advanced at least BID, replace prn   Follow magnesium closely as is a cofactor for thiamine    5. Continue MVI tab daily  6. Bowel regimen per medical team    Author:  Para March, RD, pager [phone]  05/03/2022 12:48 PM

## 2022-05-03 NOTE — Progress Notes
Report given to ryan rn in 3nw

## 2022-05-03 NOTE — Other
Patient's Clinical Goal:Pain Management - Comfort - Rest - Safety   Clinical Goal(s) for the Shift: Hemodynamic Stability - Pain Management - Comfort - Rest - Safety  Identify possible barriers to advancing the care plan:None  Stability of the patient: Moderately Stable - low risk of patient condition declining or worsening   Progression of Patient's Clinical Goal:     Pt remains AOx4. Afebrile, VSS, and on RA. No new acute neurovascular deficit noted. Pain managed w/ PRN PO/IVP meds. BMAT 4. Pt is able to perform ADLs and reposition self in bed independently. Skin care given to maintain skin integrity. Pt was unable to tolerated oral medications or liquid diet without n/v; PRN antiemetic given.  Pt is voiding and passing gas. TVR cont. IS used for pulmonary hygiene. Safety maintained, call light within reach, and needs all met. Endorsed plan of care to next RN.

## 2022-05-04 LAB — MRSA Surveillance

## 2022-05-04 MED ORDER — SENNOSIDES 8.6 MG PO TABS
1 | ORAL_TABLET | Freq: Every evening | ORAL | 0 refills | PRN
Start: 2022-05-04 — End: ?

## 2022-05-04 MED ORDER — BISACODYL 5 MG PO TBEC
5 mg | ORAL_TABLET | Freq: Every day | ORAL | 0 refills | PRN
Start: 2022-05-04 — End: ?

## 2022-05-04 MED ORDER — NICOTINE 21 MG/24HR TD PT24
1 | MEDICATED_PATCH | Freq: Every day | TRANSDERMAL | 0 refills
Start: 2022-05-04 — End: ?

## 2022-05-04 MED ADMIN — SUCRALFATE 1 GM/10ML PO SUSP: 1 g | ORAL | @ 19:00:00 | Stop: 2022-06-01

## 2022-05-04 MED ADMIN — MULTI-VITAMINS PO TABS: 1 | ORAL | @ 17:00:00 | Stop: 2022-05-07

## 2022-05-04 MED ADMIN — TRAZODONE HCL 100 MG PO TABS: 100 mg | ORAL | @ 06:00:00 | Stop: 2022-05-07

## 2022-05-04 MED ADMIN — METOCLOPRAMIDE HCL 5 MG PO TABS: 10 mg | ORAL | @ 18:00:00 | Stop: 2022-05-07

## 2022-05-04 MED ADMIN — HYDROMORPHONE HCL 1 MG/ML IJ SOLN: .2 mg | INTRAVENOUS | @ 13:00:00 | Stop: 2022-05-05 | NDC 00409128331

## 2022-05-04 MED ADMIN — PANTOPRAZOLE SODIUM 40 MG IV SOLR: 40 mg | INTRAVENOUS | @ 06:00:00 | Stop: 2022-05-04 | NDC 00781323295

## 2022-05-04 MED ADMIN — METHOCARBAMOL IVPB: 750 mg | INTRAVENOUS | @ 06:00:00 | Stop: 2022-05-04 | NDC 71288071610

## 2022-05-04 MED ADMIN — CEFTRIAXONE 1 GM/50 ML RTU: 1 g | INTRAVENOUS | @ 01:00:00 | Stop: 2022-05-05 | NDC 00338500241

## 2022-05-04 MED ADMIN — METHOCARBAMOL 750 MG PO TABS: 750 mg | ORAL | @ 04:00:00 | Stop: 2022-05-07

## 2022-05-04 MED ADMIN — FLUTICASONE-SALMETEROL 250-50 MCG/ACT IN AEPB: 1 | RESPIRATORY_TRACT | @ 15:00:00 | Stop: 2022-05-07 | NDC 00054032756

## 2022-05-04 MED ADMIN — METHOCARBAMOL 750 MG PO TABS: 750 mg | ORAL | @ 19:00:00 | Stop: 2022-05-07

## 2022-05-04 MED ADMIN — METOCLOPRAMIDE HCL 5 MG/ML IJ SOLN: 5 mg | INTRAVENOUS | @ 13:00:00 | Stop: 2022-05-07 | NDC 00409341401

## 2022-05-04 MED ADMIN — METHOCARBAMOL 750 MG PO TABS: 750 mg | ORAL | @ 14:00:00 | Stop: 2022-05-07

## 2022-05-04 MED ADMIN — SUCRALFATE 1 GM/10ML PO SUSP: 1 g | ORAL | @ 01:00:00 | Stop: 2022-06-01

## 2022-05-04 MED ADMIN — SUCRALFATE 1 GM/10ML PO SUSP: 1 g | ORAL | @ 06:00:00 | Stop: 2022-05-07

## 2022-05-04 MED ADMIN — METOCLOPRAMIDE HCL 5 MG PO TABS: 10 mg | ORAL | @ 14:00:00 | Stop: 2022-05-07

## 2022-05-04 MED ADMIN — SUCRALFATE 1 GM/10ML PO SUSP: 1 g | ORAL | @ 17:00:00 | Stop: 2022-05-07

## 2022-05-04 MED ADMIN — METOCLOPRAMIDE HCL 5 MG PO TABS: 10 mg | ORAL | @ 01:00:00 | Stop: 2022-06-01

## 2022-05-04 MED ADMIN — ONDANSETRON HCL 4 MG/2ML IJ SOLN: 4 mg | INTRAVENOUS | @ 17:00:00 | Stop: 2022-05-07 | NDC 60505613000

## 2022-05-04 MED ADMIN — HYDROMORPHONE HCL 1 MG/ML IJ SOLN: .2 mg | INTRAVENOUS | @ 04:00:00 | Stop: 2022-05-05 | NDC 00409128331

## 2022-05-04 MED ADMIN — METOCLOPRAMIDE HCL 5 MG/ML IJ SOLN: 5 mg | INTRAVENOUS | @ 04:00:00 | Stop: 2022-05-04 | NDC 00409341401

## 2022-05-04 MED ADMIN — LACTATED RINGERS IV SOLN: 100 mL/h | INTRAVENOUS | @ 08:00:00 | Stop: 2022-05-04 | NDC 00338011704

## 2022-05-04 MED ADMIN — METHOCARBAMOL IVPB: 750 mg | INTRAVENOUS | @ 04:00:00 | Stop: 2022-05-04

## 2022-05-04 MED ADMIN — ONDANSETRON HCL 4 MG/2ML IJ SOLN: 4 mg | INTRAVENOUS | @ 08:00:00 | Stop: 2022-05-07 | NDC 60505613000

## 2022-05-04 MED ADMIN — QUETIAPINE FUMARATE 100 MG PO TABS: 100 mg | ORAL | @ 17:00:00 | Stop: 2022-05-07

## 2022-05-04 MED ADMIN — HYDROMORPHONE HCL 1 MG/ML IJ SOLN: .2 mg | INTRAVENOUS | @ 08:00:00 | Stop: 2022-05-05 | NDC 00409128331

## 2022-05-04 MED ADMIN — HYDROMORPHONE HCL 1 MG/ML IJ SOLN: .2 mg | INTRAVENOUS | @ 17:00:00 | Stop: 2022-05-05 | NDC 00409128331

## 2022-05-04 MED ADMIN — METRONIDAZOLE 500 MG/100ML IV SOLN: 500 mg | INTRAVENOUS | @ 08:00:00 | Stop: 2022-05-05 | NDC 00338105548

## 2022-05-04 MED ADMIN — HYDROMORPHONE HCL 1 MG/ML IJ SOLN: .2 mg | INTRAVENOUS | @ 21:00:00 | Stop: 2022-05-05 | NDC 00409128331

## 2022-05-04 MED ADMIN — PANTOPRAZOLE SODIUM 40 MG PO TBEC: 40 mg | ORAL | @ 17:00:00 | Stop: 2022-05-07

## 2022-05-04 MED ADMIN — FLUOXETINE HCL 20 MG PO CAPS: 40 mg | ORAL | @ 17:00:00 | Stop: 2022-05-07

## 2022-05-04 MED ADMIN — FLUTICASONE-SALMETEROL 250-50 MCG/ACT IN AEPB: 1 | RESPIRATORY_TRACT | @ 04:00:00 | Stop: 2022-05-07

## 2022-05-04 MED ADMIN — METRONIDAZOLE 500 MG/100ML IV SOLN: 500 mg | INTRAVENOUS | @ 15:00:00 | Stop: 2022-05-05 | NDC 00409015201

## 2022-05-04 MED ADMIN — QUETIAPINE FUMARATE 100 MG PO TABS: 100 mg | ORAL | @ 06:00:00 | Stop: 2022-05-07

## 2022-05-04 MED ADMIN — ENOXAPARIN SODIUM 40 MG/0.4ML IJ SOSY: 40 mg | SUBCUTANEOUS | @ 16:00:00 | Stop: 2022-05-07

## 2022-05-04 MED ADMIN — NICOTINE 21 MG/24HR TD PT24: 21 mg | TRANSDERMAL | @ 16:00:00 | Stop: 2022-05-07 | NDC 00536589688

## 2022-05-04 NOTE — Progress Notes
INTERNAL MEDICINE - INPATIENT PROGRESS NOTE    DATE OF SERVICE: 05/04/2022   HOSPITAL DAY: 0    CC: Abdominal Pain (Pt reports abdominal pain, diarrhea, weakness and SOB ever since surgery, pt has been hospitalized for GI infections. /4/7-Lap Chole with LOA, 5/2-Endoscopic Retrograde with stent placement, pt has a follow up appointment on 5/19 but feels like she needs medical attention /)    INTERVAL EVENTS AND REVIEW OF SYSTEMS:  -Admitted yesterday for acute abdominal pain, ongoing x 5 days, lower abdomen, severe, associated with episode of diarrhea daily and poor PO intake  -Improvement in pain with IV dilaudid    INTERVAL RECORDS:  I have reviewed the interval physician notes, nursing notes, and allied health professional notes over the last 24 hours.    Subjective:  -she feels better today, resting comfortably in bed she denies any fevers chills.  She continues to have diffuse abdominal pains.  States this is chronic.  Also had some more diarrhea last night.  Her nausea has improved.  She is been tolerating clear liquids this morning.  She wants to try regular foods.    MEDICATIONS:  cefTRIAXone, 1 g, Intravenous, Q24H  enoxaparin, 40 mg, Subcutaneous, Daily  FLUoxetine, 40 mg, Oral, Daily  fluticasone-salmeterol, 1 puff, Inhalation, BID  methocarbamol, 750 mg, Oral, TID  metoclopramide, 10 mg, Oral, TID AC  metroNIDAZOLE, 500 mg, Intravenous, Q8H  multivitamin, 1 tablet, Oral, Daily  nicotine, 1 patch, Transdermal, Daily  pantoprazole, 40 mg, Oral, Daily  QUEtiapine, 100 mg, Oral, BID  sucralfate, 1 g, Oral, TID w/meals & QHS  traZODone, 100 mg, Oral, QHS  PRNs: acetaminophen **OR** acetaminophen suppository, bisacodyl, HYDROmorphone, ipratropium-albuterol, magnesium hydroxide, melatonin oral/enteral/sublingual, metoclopramide, ondansetron **OR** ondansetron injection/IVPB, oxyCODONE, oxyCODONE, senna    VITALS:  Temp:  [36.5 ?C (97.7 ?F)-36.9 ?C (98.5 ?F)] 36.5 ?C (97.7 ?F)  Heart Rate:  [66-70] 66  Resp: [16-17] 17  BP: (128-145)/(76-86) 130/86  NBP Mean:  [91-100] 100  SpO2:  [93 %-96 %] 96 %     Weight: 54.4 kg (120 lb) Oxygen Therapy  SpO2: 96 %  O2 Device: None (Room air)     IN'S AND OUT'S:  I/O last 2 completed shifts:  In: 1230 [P.O.:210; I.V.:870; IV Piggyback:150]  Out: 300 [Urine:300]    PHYSICAL EXAM:  General: alert, well appearing, and in no distress.  Oropharynx: mucous membranes moist, oropharynx clear   Neck: supple, no significant adenopathy  Cardiac: Regular rate and rhythm, no murmurs/rubs/gallops.   Lungs: Clear to auscultation with normal work of breathing  Abdomen: soft, nondistended, midline surgical scars noted, generalized TTP  Extremities: Warm, well perfused. No LE edema  Skin: warm and dry, no significant rashes or lesions  Neuro: No focal deficits    LABS:  I have reviewed the pertinent laboratory data.    CBC  Recent Labs     05/03/22  0526 05/02/22  1045   WBC 5.92 6.32   HGB 9.0* 9.6*   HCT 28.4* 30.7*   MCV 87.1 88.2   PLT 457* 455*     BMP  Recent Labs     05/03/22  0526 05/02/22  1302 05/02/22  0918   NA 138 134* 134*   K 3.9 4.3 4.7   CL 105 103 101   CO2 21 18* 17*   BUN 5* 8 10   CREAT 0.84 1.02 1.14   CALCIUM 8.6 8.6 9.6   MG 1.3*  --   --    PHOS  3.4  --   --      LFT  Recent Labs     05/03/22  0526 05/02/22  0918   TOTPRO 5.9* 7.4   ALBUMIN 3.2* 3.9   BILITOT 0.3 0.3   BILICON <0.2  --    ALT <5* 7*   AST 17 27   ALKPHOS 94 119*   LIPASE  --  94*     Coags  No results for input(s): INR, PT, APTT in the last 72 hours.    MICRO:  5/13 BCx NGTD    IMAGING AND STUDIES:  I have reviewed the pertinent imaging data.    No imaging has been resulted in the last 24 hours  ASSESSMENT/PLAN:  Donna Gross is a 56 y.o. female with PMHx PUD c/b GOO s/p multiple surgeries including vagotomy, hernia repair, colon perforation s/p colostomy and reversal, gallstone pancreatitis s/p lap cholecystectomy (03/27/2022) c/b fluid collection s/p biliary drain, ERCP and biliary stent placement (04/22/2022), anxiety/depression, emphysema, fibromyalgia who presented with acute abdominal pain, found with colitis.     #Acute abdominal Pain, POA  #Colitis, POA  #Hx multiple abd surgeries, POA  #Hx recent lab chole (03/27/2022) c/b fluid collection s/p biliary drain, POA  #Hx recent biliary stent (04/22/2022), POA  - Patient recently underwent CT imaging 2/2 recent stent placement w/ no findings of free air, stent migration, or bile leak. No abscess noted. Colitis suspected given thickening of bowel wall in the transverse colon- does appear new compared to prior recent CT  - Defer surgery and GI consults for now given imaging above  5/15  - Cont IV CTX, flagyl (5/13-) for colitis   - diet advanced  - Tylenol/Oxy/Dilaudid for pain  - Zofran 4 mg q8h PRN  + regal 10 mg TID before meals  - C/S to Physician Nutrition Services  - Methocarbamol TID  - Ova/Parasite Studies  - Bacterial enteropathogen panel  - Parasitic enteropathogen panel  - Continue Protonix 40 mg qDay  - Continue Sucralfate 1 g TID    ?  #HypoMg, POA  - IV Mg repletion, trend    CHRONIC/STABLE:  #Anxiety, POA   #Depression, POA - see treatment modalities below   #Fibromyalgia - Continue Fluoxetine 40 mg qday   #Insomnia, POA - Continue Seroquel 100 mg BID + Trazodone 100 mg at bedtime, Melatonin also available as PRN   #Tobacco Dependence - Nicotine patch     I have seen and examined the patient and agree with the RD assessment detailed below:  Patient meets criteria for: Severe protein calorie malnutrition    (current weight 54.4 kg (120 lb), BMI (Calculated): 20.6; IBW: 54.4 kg (120 lb), % Ideal Body Weight: 100 %). See RD notes for additional details.    INPATIENT CHECKLIST  VTE Prophylaxis: enoxaparin  Central Lines: none. Tubes/Drains: none. Airways: none  Diet: Diet clear liquid  Activity: as tolerated  Precautions: No infectious isolation, standard precautions.    ADVANCED DIRECTIVES:  Full Code, Primary Emergency Contact: [name],[name]    DISPOSITION:  Observation, Non-Monitored. Expected post-hospitalization disposition will be to home.    AUTHOR:  Marcy Siren, MD  05/04/2022 at 7:33 AM

## 2022-05-04 NOTE — Other
Patient's Clinical Goal:Pain Management - Comfort - Rest - Safety   Clinical Goal(s) for the Shift: Hemodynamic Stability - Pain Management - Comfort - Rest - Safety  Identify possible barriers to advancing the care plan:None  Stability of the patient: Moderately Stable - low risk of patient condition declining or worsening   Progression of Patient's Clinical Goal:     Pt remains AOx4. Afebrile, VSS, and on RA. No new acute neurovascular deficit noted. Pain managed w/ PRN IVP meds. BMAT 4. Pt is able to perform ADLs and reposition self in bed independently. Skin care given to maintain skin integrity. Pt was not able tolerated liquid diet or oral medications without n/v; PRN antiemetic given. Pt is voiding and passing gas. TVR cont. IS used for pulmonary hygiene. Safety maintained, call light within reach, and needs all met. Endorsed plan of care to next RN.

## 2022-05-04 NOTE — Other
Patient's Clinical Goal:   Clinical Goal(s) for the Shift: Pain control, tolerate clear liquids  Identify possible barriers to advancing the care plan: Pain & nausea.  Stability of the patient: Moderately Stable - low risk of patient condition declining or worsening   Progression of Patient's Clinical Goal: VS moderately stable. Dilaudid IV and IV antiemetics. Drinks small sips of water. Refused all po medications. Flagyl IV and ceftriaxone IV. LR @100  ml/hr. Stool for arasite enteric pathogen panel pending.

## 2022-05-05 ENCOUNTER — Ambulatory Visit: Payer: PRIVATE HEALTH INSURANCE

## 2022-05-05 DIAGNOSIS — K529 Noninfective gastroenteritis and colitis, unspecified: Secondary | ICD-10-CM

## 2022-05-05 LAB — C difficile PCR: C DIFFICILE PCR: NEGATIVE

## 2022-05-05 MED ORDER — SENNOSIDES 8.6 MG PO TABS
1 | ORAL_TABLET | Freq: Every evening | ORAL | 0 refills | PRN
Start: 2022-05-05 — End: ?

## 2022-05-05 MED ORDER — BISACODYL 5 MG PO TBEC
5 mg | ORAL_TABLET | Freq: Every day | ORAL | 0 refills | PRN
Start: 2022-05-05 — End: ?

## 2022-05-05 MED ORDER — NICOTINE 21 MG/24HR TD PT24
1 | MEDICATED_PATCH | Freq: Every day | TRANSDERMAL | 0 refills
Start: 2022-05-05 — End: ?

## 2022-05-05 MED ADMIN — FLUTICASONE-SALMETEROL 250-50 MCG/ACT IN AEPB: 1 | RESPIRATORY_TRACT | @ 15:00:00 | Stop: 2022-05-07 | NDC 00054032756

## 2022-05-05 MED ADMIN — ENOXAPARIN SODIUM 40 MG/0.4ML IJ SOSY: 40 mg | SUBCUTANEOUS | @ 15:00:00 | Stop: 2022-05-07

## 2022-05-05 MED ADMIN — IPRATROPIUM-ALBUTEROL 20-100 MCG/ACT IN AERS: 2 | RESPIRATORY_TRACT | @ 05:00:00 | Stop: 2022-05-07 | NDC 00597002402

## 2022-05-05 MED ADMIN — ONDANSETRON HCL 4 MG/2ML IJ SOLN: 4 mg | INTRAVENOUS | @ 02:00:00 | Stop: 2022-05-07 | NDC 60505613000

## 2022-05-05 MED ADMIN — METOCLOPRAMIDE HCL 5 MG PO TABS: 10 mg | ORAL | @ 18:00:00 | Stop: 2022-06-01 | NDC 51079088601

## 2022-05-05 MED ADMIN — AMOXICILLIN-POT CLAVULANATE 875-125 MG PO TABS: 1 | ORAL | @ 15:00:00 | Stop: 2022-05-07 | NDC 65862050320

## 2022-05-05 MED ADMIN — METOCLOPRAMIDE HCL 5 MG PO TABS: 10 mg | ORAL | @ 01:00:00 | Stop: 2022-06-01

## 2022-05-05 MED ADMIN — ENOXAPARIN SODIUM 40 MG/0.4ML IJ SOSY: 40 mg | SUBCUTANEOUS | @ 15:00:00 | Stop: 2022-05-07 | NDC 71288041082

## 2022-05-05 MED ADMIN — SUCRALFATE 1 GM/10ML PO SUSP: 1 g | ORAL | @ 04:00:00 | Stop: 2022-05-07

## 2022-05-05 MED ADMIN — QUETIAPINE FUMARATE 100 MG PO TABS: 100 mg | ORAL | @ 04:00:00 | Stop: 2022-05-07

## 2022-05-05 MED ADMIN — SUCRALFATE 1 GM/10ML PO SUSP: 1 g | ORAL | @ 01:00:00 | Stop: 2022-05-07

## 2022-05-05 MED ADMIN — HYDROMORPHONE HCL 1 MG/ML IJ SOLN: .2 mg | INTRAVENOUS | @ 05:00:00 | Stop: 2022-05-05 | NDC 00409128331

## 2022-05-05 MED ADMIN — OXYCODONE HCL 5 MG PO TABS: 10 mg | ORAL | @ 15:00:00 | Stop: 2022-05-05 | NDC 00406055262

## 2022-05-05 MED ADMIN — METHOCARBAMOL 750 MG PO TABS: 750 mg | ORAL | @ 18:00:00 | Stop: 2022-05-07 | NDC 60687056811

## 2022-05-05 MED ADMIN — HYDROMORPHONE HCL 1 MG/ML IJ SOLN: .2 mg | INTRAVENOUS | @ 01:00:00 | Stop: 2022-05-05 | NDC 00409128331

## 2022-05-05 MED ADMIN — HYDROMORPHONE HCL 1 MG/ML IJ SOLN: .2 mg | INTRAVENOUS | @ 12:00:00 | Stop: 2022-05-05 | NDC 00409128331

## 2022-05-05 MED ADMIN — SUCRALFATE 1 GM/10ML PO SUSP: 1 g | ORAL | @ 15:00:00 | Stop: 2022-05-07 | NDC 50268073211

## 2022-05-05 MED ADMIN — CEFTRIAXONE 1 GM/50 ML RTU: 1 g | INTRAVENOUS | @ 02:00:00 | Stop: 2022-05-05 | NDC 00338500241

## 2022-05-05 MED ADMIN — NICOTINE 21 MG/24HR TD PT24: 21 mg | TRANSDERMAL | @ 15:00:00 | Stop: 2022-05-07 | NDC 00536589688

## 2022-05-05 MED ADMIN — METHOCARBAMOL 750 MG PO TABS: 750 mg | ORAL | Stop: 2022-05-07

## 2022-05-05 MED ADMIN — QUETIAPINE FUMARATE 100 MG PO TABS: 100 mg | ORAL | @ 15:00:00 | Stop: 2022-06-01 | NDC 50268063211

## 2022-05-05 MED ADMIN — METOCLOPRAMIDE HCL 5 MG/ML IJ SOLN: 5 mg | INTRAVENOUS | @ 05:00:00 | Stop: 2022-05-07 | NDC 00409341401

## 2022-05-05 MED ADMIN — TRAZODONE HCL 100 MG PO TABS: 100 mg | ORAL | @ 04:00:00 | Stop: 2022-05-07

## 2022-05-05 MED ADMIN — METRONIDAZOLE 500 MG/100ML IV SOLN: 500 mg | INTRAVENOUS | @ 07:00:00 | Stop: 2022-05-05 | NDC 00338105548

## 2022-05-05 MED ADMIN — MULTI-VITAMINS PO TABS: 1 | ORAL | @ 15:00:00 | Stop: 2022-05-07 | NDC 00904053961

## 2022-05-05 MED ADMIN — METHOCARBAMOL 750 MG PO TABS: 750 mg | ORAL | @ 04:00:00 | Stop: 2022-05-07

## 2022-05-05 MED ADMIN — HYDROMORPHONE HCL 1 MG/ML IJ SOLN: .2 mg | INTRAVENOUS | @ 23:00:00 | Stop: 2022-05-12 | NDC 00409128331

## 2022-05-05 MED ADMIN — METRONIDAZOLE 500 MG/100ML IV SOLN: 500 mg | INTRAVENOUS | @ 01:00:00 | Stop: 2022-05-05 | NDC 00409015201

## 2022-05-05 MED ADMIN — FLUOXETINE HCL 20 MG PO CAPS: 40 mg | ORAL | @ 15:00:00 | Stop: 2022-05-07 | NDC 00904578561

## 2022-05-05 MED ADMIN — SUCRALFATE 1 GM/10ML PO SUSP: 1 g | ORAL | @ 18:00:00 | Stop: 2022-05-07 | NDC 50268073211

## 2022-05-05 MED ADMIN — ONDANSETRON HCL 4 MG/2ML IJ SOLN: 4 mg | INTRAVENOUS | @ 15:00:00 | Stop: 2022-05-07 | NDC 60505613000

## 2022-05-05 MED ADMIN — FLUTICASONE-SALMETEROL 250-50 MCG/ACT IN AEPB: 1 | RESPIRATORY_TRACT | @ 05:00:00 | Stop: 2022-05-07

## 2022-05-05 MED ADMIN — PANTOPRAZOLE SODIUM 40 MG PO TBEC: 40 mg | ORAL | @ 15:00:00 | Stop: 2022-06-01 | NDC 68084081311

## 2022-05-05 MED ADMIN — HYDROMORPHONE HCL 1 MG/ML IJ SOLN: .2 mg | INTRAVENOUS | @ 09:00:00 | Stop: 2022-05-05 | NDC 00409128331

## 2022-05-05 MED ADMIN — OXYCODONE HCL 5 MG PO TABS: 10 mg | ORAL | @ 20:00:00 | Stop: 2022-05-06 | NDC 00406055262

## 2022-05-05 MED ADMIN — METOCLOPRAMIDE HCL 5 MG PO TABS: 10 mg | ORAL | Stop: 2022-06-01

## 2022-05-05 MED ADMIN — IPRATROPIUM-ALBUTEROL 20-100 MCG/ACT IN AERS: 2 | RESPIRATORY_TRACT | @ 15:00:00 | Stop: 2022-05-07 | NDC 00597002402

## 2022-05-05 NOTE — Other
Patient's Clinical Goal:   Clinical Goal(s) for the Shift: Tolerate, regular diet, send stool for c-dif  Identify possible barriers to advancing the care plan: pain & nausea   Stability of the patient: Moderately Stable - low risk of patient condition declining or worsening   Progression of Patient's Clinical Goal: VS moderately stable. Dilaudid IV for pain control q 4 hrs and zofran IV given for nausea. Stool sent for c=diff but not enough to collect parasite panel.Marland Kitchen

## 2022-05-05 NOTE — Other
Patient's Clinical Goal: pain control  Clinical Goal(s) for the Shift: pain control & stable GI status  Identify possible barriers to advancing the care plan: none  Stability of the patient: Moderately Stable - low risk of patient condition declining or worsening   Progression of Patient's Clinical Goal: Pt slept well, pain well controlled by  Dilaudid 0.2 iv prn.  Pt with occ co nausearelieved by Reglan/Zofran iv prn.  No emesis noted. No s/s resp distress.  Pt voids  and ambulates with steady gait.  Pt oriented x 4.

## 2022-05-05 NOTE — Other
Patient's Clinical Goal: pain control  Clinical Goal(s) for the Shift: comfort  Identify possible barriers to advancing the care plan: none  Stability of the patient: Moderately Unstable - medium risk of patient condition declining or worsening    Progression of Patient's Clinical Goal:   Patient A/Ox4, RA, vss, skin intact, contx2, BMATx4. Patient reporting abdominal pain and low appetite Pain controlled with PRN's. Stool sample sent to lab todayx2, still pending results. Provided report to Herrin in 4SW and will transfer patient after medicating for pain. Call light in reach and bed alarm on.

## 2022-05-05 NOTE — Consults
IP CM ACTIVE DISCHARGE PLANNING  Department of Care Coordination      Admit [id number]  Anticipated Date of Discharge: 05/05/2022    Following VQ:QVZD Zenda Alpers, MD      Today's short update     per IDR discussion with Dr. Zenda Alpers, patient not stable for dc, working up diarrhea. Patient with no CM needs anticipated to home upon dc.    Disposition     Home, No needs identified  [address]   [place] [address]  Family/Support System in agreement with the current discharge plan: Yes, in agreement and participating    CM remains available with safe discharge planning as needed.

## 2022-05-05 NOTE — Consults
CASE MANAGER ASSESSMENT      Admit [id number]    Date of Initial CM Assessment: 05/05/2022    Problems: Principal Problem:    Abdominal pain POA: Yes       Past Medical History:   Diagnosis Date   ? Anxiety    ? Depression    ? Emphysema, unspecified (HCC/RAF)    ? Emphysema, unspecified (HCC/RAF)    ? Fibromyalgia    ? Gastritis    ? GERD (gastroesophageal reflux disease)    ? Pancreatitis     Past Surgical History:   Procedure Laterality Date   ? ABDOMINAL SURGERY     ? COLON SURGERY            Primary Care Physician:Derrick Libby Maw, MD  Phone:[phone]      NEEDS ASSESSMENT:     Level of Function Prior to Admit: Self Care/Indep. W ADLs    Primary Living Situation: Lives w/Frail Spouse, Lives w/Family    Hours of Assistance/Day: family available as needed    Pre-admission Living Situation: Home/Apartment                  Primary Support Systems: Children, Parent, Family members    Contact Name: Clemetine Marker Daughter   [phone] Phone Number: [name],[name] Daughter   [phone]   Does the patient have a Family/Support System member participating in Discharge Planning?: Yes    DPOA?: Yes DPOA Type: Medical     Bathroom on Main Floor: Yes  Stairs in Home: 0       Prior Treatments / Services: None      Who is your PCP?: Kelle Darting, MD Phone   [phone]    Do you have your Primary Care Doctor's office number?: Yes    How often do you visit your doctor?: Semi-annual    Do you need information/education regarding your medical condition?: No       Verbalized financial concerns?: No       Were you hospitalized in the last 30 days?: No      READMIT ASSESSMENT: (IF APPLICABLE)     Is this a planned readmission?: No       Interview is with: Patient      In your opinion, what brought you back to the hospital?: Other (Comment) (abdominal pain)   Did you receive your DC instructions from your previous DC?: Yes   Was there anything missing from or unclear with your discharge instructions?: No FOLLOW UP APPT QUESTIONS: (IF APPLICABLE)     Was the follow up appt made before discharge?: Yes      RISK STRATIFICATION: (IF APPLICABLE)     > 2 admissions within the last 12 months?: Yes    Multiple co-morbidities?: Yes          DISCHARGE ASSESSMENT:     Projected Date of Discharge: 05/06/2022    Anticipated Complex D/C?: No    Projected Discharge to: Home    Discharge Address: [address]   [place] [address]    Projected Discharge Needs: None    Freedom of Choice: Educated and Provided                        Support Identified at Discharge: Child, Parent  Name of Discharge Support Person: Clemetine Marker Daughter   [phone] Phone Number: Clemetine Marker Daughter   [phone]     Who is available to transport you upon discharge?: Family Transportation  Care Coordination packet given to patient/patient family as a resource and for review. Structure and function of case management was discussed with patient/patient family, who verbalized understanding. Will continue to follow and assist with safe discharge planning with Care Coordination Team, CM and SW as needed

## 2022-05-05 NOTE — Progress Notes
INTERNAL MEDICINE - INPATIENT PROGRESS NOTE    DATE OF SERVICE: 05/05/2022   HOSPITAL DAY: 0    CC: Abdominal Pain (Pt reports abdominal pain, diarrhea, weakness and SOB ever since surgery, pt has been hospitalized for GI infections. /4/7-Lap Chole with LOA, 5/2-Endoscopic Retrograde with stent placement, pt has a follow up appointment on 5/19 but feels like she needs medical attention /)    INTERVAL EVENTS AND REVIEW OF SYSTEMS:  -Admitted yesterday for acute abdominal pain, ongoing x 5 days, lower abdomen, severe, associated with episode of diarrhea daily and poor PO intake  -Improvement in pain with IV dilaudid    INTERVAL RECORDS:  I have reviewed the interval physician notes, nursing notes, and allied health professional notes over the last 24 hours.    Subjective:  -she feels better today.  Resting comfortably in bed she was able to tolerate more food.  She only had 1-2 bouts of loose stool since yesterday.  Her abdominal pain is improved as well.  She denies any fevers chills.  No nausea or vomiting.  No other new somatic complaints      MEDICATIONS:  cefTRIAXone, 1 g, Intravenous, Q24H  enoxaparin, 40 mg, Subcutaneous, Daily  FLUoxetine, 40 mg, Oral, Daily  fluticasone-salmeterol, 1 puff, Inhalation, BID  methocarbamol, 750 mg, Oral, TID  metoclopramide, 10 mg, Oral, TID AC  metroNIDAZOLE, 500 mg, Intravenous, Q8H  multivitamin, 1 tablet, Oral, Daily  nicotine, 1 patch, Transdermal, Daily  pantoprazole, 40 mg, Oral, Daily  QUEtiapine, 100 mg, Oral, BID  sucralfate, 1 g, Oral, TID w/meals & QHS  traZODone, 100 mg, Oral, QHS  PRNs: acetaminophen **OR** acetaminophen suppository, bisacodyl, HYDROmorphone, ipratropium-albuterol, magnesium hydroxide, melatonin oral/enteral/sublingual, metoclopramide, ondansetron **OR** ondansetron injection/IVPB, oxyCODONE, oxyCODONE, senna    VITALS:  Temp:  [36.9 ?C (98.4 ?F)-37.1 ?C (98.8 ?F)] 36.9 ?C (98.4 ?F)  Heart Rate:  [66-85] 66  Resp:  [16-19] 16  BP: (125-141)/(80-87) 125/81  NBP Mean:  [95-99] 95  SpO2:  [94 %-95 %] 95 %     Weight: 54.4 kg (120 lb) Oxygen Therapy  SpO2: 95 %  O2 Device: None (Room air)     IN'S AND OUT'S:  I/O last 2 completed shifts:  In: 2170 [P.O.:1770; Other:200; IV Piggyback:200]  Out: 1400 [Urine:1400]    PHYSICAL EXAM:  General: alert, well appearing, and in no distress.  Oropharynx: mucous membranes moist, oropharynx clear   Neck: supple, no significant adenopathy  Cardiac: Regular rate and rhythm, no murmurs/rubs/gallops.   Lungs: Clear to auscultation with normal work of breathing  Abdomen: soft, nondistended, midline surgical scars noted, generalized TTP  Extremities: Warm, well perfused. No LE edema  Skin: warm and dry, no significant rashes or lesions  Neuro: No focal deficits    LABS:  I have reviewed the pertinent laboratory data.    CBC  Recent Labs     05/03/22  0526 05/02/22  1045   WBC 5.92 6.32   HGB 9.0* 9.6*   HCT 28.4* 30.7*   MCV 87.1 88.2   PLT 457* 455*     BMP  Recent Labs     05/03/22  0526 05/02/22  1302 05/02/22  0918   NA 138 134* 134*   K 3.9 4.3 4.7   CL 105 103 101   CO2 21 18* 17*   BUN 5* 8 10   CREAT 0.84 1.02 1.14   CALCIUM 8.6 8.6 9.6   MG 1.3*  --   --    PHOS  3.4  --   --      LFT  Recent Labs     05/03/22  0526 05/02/22  0918   TOTPRO 5.9* 7.4   ALBUMIN 3.2* 3.9   BILITOT 0.3 0.3   BILICON <0.2  --    ALT <5* 7*   AST 17 27   ALKPHOS 94 119*   LIPASE  --  94*     Coags  No results for input(s): INR, PT, APTT in the last 72 hours.    MICRO:  5/13 BCx NGTD    IMAGING AND STUDIES:  I have reviewed the pertinent imaging data.    No imaging has been resulted in the last 24 hours  ASSESSMENT/PLAN:  Donna Gross is a 56 y.o. female with PMHx PUD c/b GOO s/p multiple surgeries including vagotomy, hernia repair, colon perforation s/p colostomy and reversal, gallstone pancreatitis s/p lap cholecystectomy (03/27/2022) c/b fluid collection s/p biliary drain, ERCP and biliary stent placement (04/22/2022), anxiety/depression, emphysema, fibromyalgia who presented with acute abdominal pain, found with colitis.     #Acute abdominal Pain, POA  #Colitis, POA  #Hx multiple abd surgeries, POA  #Hx recent lab chole (03/27/2022) c/b fluid collection s/p biliary drain, POA  #Hx recent biliary stent (04/22/2022), POA  - Patient recently underwent CT imaging 2/2 recent stent placement w/ no findings of free air, stent migration, or bile leak. No abscess noted. Colitis suspected given thickening of bowel wall in the transverse colon- does appear new compared to prior recent CT  - Defer surgery and GI consults for now given imaging above  5/16  - dc IV CTX, flagyl (5/13-) , start augmentin 5/16-  - diet advanced to regular 5/15- tolerating   - one bm last 24 hours  - Tylenol/oxycodonel for pain, dc IV dilaudid  - Zofran 4 mg q8h PRN  + regal 10 mg TID before meals  - C/S to [facility]  - Methocarbamol TID  - Ova/Parasite Studies  - Bacterial enteropathogen panel neg 5/6  - Parasitic enteropathogen panel  - Continue Protonix 40 mg qDay  - Continue Sucralfate 1 g TID    ?  #HypoMg, POA  - IV Mg repletion, trend    CHRONIC/STABLE:  #Anxiety, POA   #Depression, POA - see treatment modalities below   #Fibromyalgia - Continue Fluoxetine 40 mg qday   #Insomnia, POA - Continue Seroquel 100 mg BID + Trazodone 100 mg at bedtime, Melatonin also available as PRN   #Tobacco Dependence - Nicotine patch     I have seen and examined the patient and agree with the RD assessment detailed below:  Patient meets criteria for: Severe protein calorie malnutrition    (current weight 54.4 kg (120 lb), BMI (Calculated): 20.6; IBW: 54.4 kg (120 lb), % Ideal Body Weight: 100 %). See RD notes for additional details.    INPATIENT CHECKLIST  VTE Prophylaxis: enoxaparin  Central Lines: none. Tubes/Drains: none. Airways: none  Diet: Diet regular  Activity: as tolerated  Precautions: No infectious isolation, standard precautions.    ADVANCED DIRECTIVES:  Full Code, Primary Emergency Contact: [name],[name]    DISPOSITION:  Observation, Non-Monitored. Expected post-hospitalization disposition will be to home.    AUTHOR:  Marcy Siren, MD  05/05/2022 at 7:15 AM

## 2022-05-06 LAB — Basic Metabolic Panel
CHLORIDE: 105 mmol/L (ref 96–106)
UREA NITROGEN: <2 mg/dL — ABNORMAL LOW (ref 7–22)

## 2022-05-06 LAB — CBC: HEMATOCRIT: 32.6 — ABNORMAL LOW (ref 34.9–45.2)

## 2022-05-06 LAB — Parasite Enteric Pathogen Panel: CRYPTOSPORIDIUM SPECIES PCR: NOT DETECTED

## 2022-05-06 MED ORDER — AMOXICILLIN-POT CLAVULANATE 875-125 MG PO TABS
1 | ORAL_TABLET | Freq: Two times a day (BID) | ORAL | 0 refills
Start: 2022-05-06 — End: ?
  Filled 2022-05-08: qty 4, 2d supply, fill #0

## 2022-05-06 MED ADMIN — QUETIAPINE FUMARATE 100 MG PO TABS: 100 mg | ORAL | @ 03:00:00 | Stop: 2022-05-07 | NDC 50268063211

## 2022-05-06 MED ADMIN — PANTOPRAZOLE SODIUM 40 MG PO TBEC: 40 mg | ORAL | @ 15:00:00 | Stop: 2022-05-07 | NDC 68084081311

## 2022-05-06 MED ADMIN — SODIUM CHLORIDE 0.9 % IV SOLN: 10 mL | INTRAVENOUS | @ 17:00:00 | Stop: 2022-05-07 | NDC 00338004941

## 2022-05-06 MED ADMIN — METOCLOPRAMIDE HCL 5 MG PO TABS: 10 mg | ORAL | @ 15:00:00 | Stop: 2022-05-07 | NDC 51079088601

## 2022-05-06 MED ADMIN — METOCLOPRAMIDE HCL 5 MG PO TABS: 10 mg | ORAL | @ 18:00:00 | Stop: 2022-05-07 | NDC 51079088601

## 2022-05-06 MED ADMIN — AMOXICILLIN-POT CLAVULANATE 875-125 MG PO TABS: 1 | ORAL | @ 03:00:00 | Stop: 2022-05-07 | NDC 66685100101

## 2022-05-06 MED ADMIN — SUCRALFATE 1 GM/10ML PO SUSP: 1 g | ORAL | @ 15:00:00 | Stop: 2022-05-07 | NDC 50268073211

## 2022-05-06 MED ADMIN — SUCRALFATE 1 GM/10ML PO SUSP: 1 g | ORAL | @ 03:00:00 | Stop: 2022-06-01 | NDC 50268073211

## 2022-05-06 MED ADMIN — FLUTICASONE-SALMETEROL 250-50 MCG/ACT IN AEPB: 1 | RESPIRATORY_TRACT | @ 05:00:00 | Stop: 2022-05-07

## 2022-05-06 MED ADMIN — OXYCODONE HCL 5 MG PO TABS: 10 mg | ORAL | @ 05:00:00 | Stop: 2022-05-06 | NDC 00406055262

## 2022-05-06 MED ADMIN — METHOCARBAMOL 750 MG PO TABS: 750 mg | ORAL | @ 03:00:00 | Stop: 2022-05-07 | NDC 60687056811

## 2022-05-06 MED ADMIN — ENOXAPARIN SODIUM 40 MG/0.4ML IJ SOSY: 40 mg | SUBCUTANEOUS | @ 15:00:00 | Stop: 2022-05-07

## 2022-05-06 MED ADMIN — METHOCARBAMOL 750 MG PO TABS: 750 mg | ORAL | @ 15:00:00 | Stop: 2022-05-07 | NDC 60687056811

## 2022-05-06 MED ADMIN — ONDANSETRON HCL 4 MG/2ML IJ SOLN: 4 mg | INTRAVENOUS | @ 21:00:00 | Stop: 2022-05-07 | NDC 60505613000

## 2022-05-06 MED ADMIN — METOCLOPRAMIDE HCL 5 MG PO TABS: 10 mg | ORAL | Stop: 2022-05-07 | NDC 51079088601

## 2022-05-06 MED ADMIN — SUCRALFATE 1 GM/10ML PO SUSP: 1 g | ORAL | @ 18:00:00 | Stop: 2022-05-07 | NDC 50268073211

## 2022-05-06 MED ADMIN — OXYCODONE HCL 5 MG PO TABS: 10 mg | ORAL | @ 15:00:00 | Stop: 2022-05-06 | NDC 00406055262

## 2022-05-06 MED ADMIN — FLUOXETINE HCL 20 MG PO CAPS: 40 mg | ORAL | @ 15:00:00 | Stop: 2022-05-07 | NDC 00904578561

## 2022-05-06 MED ADMIN — OXYCODONE HCL 5 MG PO TABS: 10 mg | ORAL | Stop: 2022-05-06 | NDC 00406055262

## 2022-05-06 MED ADMIN — SUCRALFATE 1 GM/10ML PO SUSP: 1 g | ORAL | Stop: 2022-05-07 | NDC 50268073211

## 2022-05-06 MED ADMIN — NICOTINE 21 MG/24HR TD PT24: 21 mg | TRANSDERMAL | @ 15:00:00 | Stop: 2022-06-01 | NDC 00536589688

## 2022-05-06 MED ADMIN — FLUTICASONE-SALMETEROL 250-50 MCG/ACT IN AEPB: 1 | RESPIRATORY_TRACT | @ 17:00:00 | Stop: 2022-05-07

## 2022-05-06 MED ADMIN — MULTI-VITAMINS PO TABS: 1 | ORAL | @ 15:00:00 | Stop: 2022-05-07 | NDC 00904053961

## 2022-05-06 MED ADMIN — HYDROMORPHONE HCL 1 MG/ML IJ SOLN: .2 mg | INTRAVENOUS | @ 08:00:00 | Stop: 2022-05-07 | NDC 00409128331

## 2022-05-06 MED ADMIN — METHOCARBAMOL 750 MG PO TABS: 750 mg | ORAL | @ 18:00:00 | Stop: 2022-05-07 | NDC 60687056811

## 2022-05-06 MED ADMIN — TRAZODONE HCL 100 MG PO TABS: 100 mg | ORAL | @ 03:00:00 | Stop: 2022-05-07 | NDC 60687045411

## 2022-05-06 MED ADMIN — HYDROMORPHONE HCL 1 MG/ML IJ SOLN: .2 mg | INTRAVENOUS | @ 17:00:00 | Stop: 2022-05-07 | NDC 00409128331

## 2022-05-06 MED ADMIN — NICOTINE 21 MG/24HR TD PT24: 21 mg | TRANSDERMAL | @ 15:00:00 | Stop: 2022-05-07

## 2022-05-06 MED ADMIN — IPRATROPIUM-ALBUTEROL 20-100 MCG/ACT IN AERS: 2 | RESPIRATORY_TRACT | @ 15:00:00 | Stop: 2022-05-07 | NDC 00597002402

## 2022-05-06 MED ADMIN — QUETIAPINE FUMARATE 100 MG PO TABS: 100 mg | ORAL | @ 15:00:00 | Stop: 2022-05-07 | NDC 50268063211

## 2022-05-06 MED ADMIN — AMOXICILLIN-POT CLAVULANATE 875-125 MG PO TABS: 1 | ORAL | @ 15:00:00 | Stop: 2022-05-07 | NDC 65862050320

## 2022-05-06 MED ADMIN — MAGNESIUM SULFATE 2 GM/50ML IV SOLN: 2 g | INTRAVENOUS | @ 17:00:00 | Stop: 2022-05-06 | NDC 25021061281

## 2022-05-06 MED ADMIN — OXYCODONE HCL 5 MG PO TABS: 5 mg | ORAL | @ 21:00:00 | Stop: 2022-05-07 | NDC 00406055262

## 2022-05-06 MED ADMIN — HYDROMORPHONE HCL 1 MG/ML IJ SOLN: .2 mg | INTRAVENOUS | @ 23:00:00 | Stop: 2022-05-07 | NDC 00409128331

## 2022-05-06 NOTE — Other
Patient's Clinical Goal:   Clinical Goal(s) for the Shift: comfort  Identify possible barriers to advancing the care plan: none  Stability of the patient: Moderately Stable - low risk of patient condition declining or worsening   Progression of Patient's Clinical Goal:   Precautions: fall precautions   VS: VSS. Afebrile.   Neuro: A&Ox4, no neuro deficits present.   Pain/Nausea: Pain managed with oxycodone and dilaudid. Nausea managed with scheduled reglan and PRN zofran.   Cardiac: S1S2, pt non tele  Resp: O2 WNL on RA  GI/GU: Continent of stool and urine. LBM: 5/16  Amb: BMAT: 3, fall risk d/t pain medication administration and occasional need for 1PA, pt refusing bed alarm, pt educated on fall risks pertaining to pt specific case  Lines: R PIV is c/d/i   Diet: Regular  Plan: continue pain management, enteric pathogen panel sent to lab     All safety precautions maintained: bed locked in low position, call light within reach, fall precautions observed- Will endorse POC to oncoming RN.

## 2022-05-06 NOTE — Other
Patient's Clinical Goal:   Clinical Goal(s) for the Shift: vss, afebrile, safety  Identify possible barriers to advancing the care plan: none  Stability of the patient: Moderately Unstable - medium risk of patient condition declining or worsening    Progression of Patient's Clinical Goal:     Patient AOx4, intermittent c/o pain to abd. PRN oxy 5-10 based on severity. PRN 0.2mg  IV dilaudid breakthrough pain. RA, non monitored. VSS, afebrile. Regular diet, tolerating well. Scheduled zofran and reglan continued. Continent x2. Skin intact; PIV R-AC, patent. BMAT 4, declining bed alarm.     Events:  Administered PRN oxy 10 x1, dilaudid IV x1.     Plan:  Pending stool studies

## 2022-05-06 NOTE — Progress Notes
INTERNAL MEDICINE - INPATIENT PROGRESS NOTE    DATE OF SERVICE: 05/06/2022   HOSPITAL DAY: 1    CC: Abdominal Pain (Pt reports abdominal pain, diarrhea, weakness and SOB ever since surgery, pt has been hospitalized for GI infections. /4/7-Lap Chole with LOA, 5/2-Endoscopic Retrograde with stent placement, pt has a follow up appointment on 5/19 but feels like she needs medical attention /)    INTERVAL EVENTS AND REVIEW OF SYSTEMS:  -today she denies any new fever chills chest pain shortness a breath nausea vomiting.  She continues to have periodic loose stools but not as frequent as before.  She is tolerating her diet but has poor appetite.  Does not endorse increasing abdominal pain.    INTERVAL RECORDS:  I have reviewed the interval physician notes, nursing notes, and allied health professional notes over the last 24 hours.    Subjective:  -she feels better today.  Resting comfortably in bed she was able to tolerate more food.  She only had 1-2 bouts of loose stool since yesterday.  Her abdominal pain is improved as well.  She denies any fevers chills.  No nausea or vomiting.  No other new somatic complaints      MEDICATIONS:  amoxicillin-clavulanate, 1 tablet, Oral, Q12H  enoxaparin, 40 mg, Subcutaneous, Daily  FLUoxetine, 40 mg, Oral, Daily  fluticasone-salmeterol, 1 puff, Inhalation, BID  methocarbamol, 750 mg, Oral, TID  metoclopramide, 10 mg, Oral, TID AC  multivitamin, 1 tablet, Oral, Daily  nicotine, 1 patch, Transdermal, Daily  pantoprazole, 40 mg, Oral, Daily  QUEtiapine, 100 mg, Oral, BID  sucralfate, 1 g, Oral, TID w/meals & QHS  traZODone, 100 mg, Oral, QHS  PRNs: acetaminophen **OR** acetaminophen suppository, bisacodyl, HYDROmorphone, ipratropium-albuterol, magnesium hydroxide, melatonin oral/enteral/sublingual, metoclopramide, ondansetron **OR** ondansetron injection/IVPB, oxyCODONE, oxyCODONE, senna, sodium chloride    VITALS:  Temp:  [36.7 ?C (98 ?F)-37.2 ?C (99 ?F)] 36.8 ?C (98.2 ?F)  Heart Rate: [76-107] 77  Resp:  [16-18] 16  BP: (88-109)/(61-78) 109/78  NBP Mean:  [71-88] 88  SpO2:  [93 %-98 %] 94 %     Weight: 54.5 kg (120 lb 4 oz) Oxygen Therapy  SpO2: 94 %  O2 Device: None (Room air)     IN'S AND OUT'S:  I/O last 2 completed shifts:  In: 880 [P.O.:880]  Out: -     PHYSICAL EXAM:  General: alert, well appearing, and in no distress.  Oropharynx: mucous membranes moist, oropharynx clear   Neck: supple, no significant adenopathy  Cardiac: Regular rate and rhythm, no murmurs/rubs/gallops.   Lungs: Clear to auscultation with normal work of breathing  Abdomen: soft, nondistended, midline surgical scars noted, generalized TTP  Extremities: Warm, well perfused. No LE edema  Skin: warm and dry, no significant rashes or lesions  Neuro: No focal deficits    LABS:  I have reviewed the pertinent laboratory data.    CBC  Recent Labs     05/06/22  0849   WBC 6.06   HGB 10.1*   HCT 32.6*   MCV 88.6   PLT 470*     BMP  Recent Labs     05/06/22  0849   NA 140   K 3.8   CL 105   CO2 23   BUN <2*   CREAT 0.85   CALCIUM 8.9     LFT  No results for input(s): TOTPRO, ALBUMIN, BILITOT, BILICON, ALT, AST, ALKPHOS, GGT, AMYLASE, LIPASE in the last 72 hours.  Coags  No results for input(s):  INR, PT, APTT in the last 72 hours.    MICRO:  5/13 BCx NGTD    IMAGING AND STUDIES:  I have reviewed the pertinent imaging data.    No imaging has been resulted in the last 24 hours  ASSESSMENT/PLAN:  Janyia Carnevale is a 56 y.o. female with PMHx PUD c/b GOO s/p multiple surgeries including vagotomy, hernia repair, colon perforation s/p colostomy and reversal, gallstone pancreatitis s/p lap cholecystectomy (03/27/2022) c/b fluid collection s/p biliary drain, ERCP and biliary stent placement (04/22/2022), anxiety/depression, emphysema, fibromyalgia who presented with acute abdominal pain, found with colitis.     #Acute abdominal Pain, POA  #Colitis, POA  #Hx multiple abd surgeries, POA  #Hx recent lab chole (03/27/2022) c/b fluid collection s/p biliary drain, POA  #Hx recent biliary stent (04/22/2022), POA  - Patient recently underwent CT imaging 2/2 recent stent placement w/ no findings of free air, stent migration, or bile leak. No abscess noted. Colitis suspected given thickening of bowel wall in the transverse colon- does appear new compared to prior recent CT  - Defer surgery and GI consults for now given imaging above  5/17  - dc IV CTX, flagyl (5/13-) , start augmentin 5/16-  - diet advanced to regular 5/15- tolerating   - one bm last 24 hours  - Tylenol/oxycodonel for pain, dc IV dilaudid  - Zofran 4 mg q8h PRN  + regal 10 mg TID before meals  - C/S to [facility]  - Methocarbamol TID  - Ova/Parasite Studies  - Bacterial enteropathogen panel neg 5/6  - Parasitic enteropathogen panel  - Continue Protonix 40 mg qDay  - Continue Sucralfate 1 g TID    ?  #HypoMg, POA  - IV Mg repletion, trend    CHRONIC/STABLE:  #Anxiety, POA   #Depression, POA - see treatment modalities below   #Fibromyalgia - Continue Fluoxetine 40 mg qday   #Insomnia, POA - Continue Seroquel 100 mg BID + Trazodone 100 mg at bedtime, Melatonin also available as PRN   #Tobacco Dependence - Nicotine patch     I have seen and examined the patient and agree with the RD assessment detailed below:  Patient meets criteria for: Severe protein calorie malnutrition    (current weight 54.5 kg (120 lb 4 oz), BMI (Calculated): 20.64; IBW: 54.4 kg (120 lb), % Ideal Body Weight: 100 %). See RD notes for additional details.    INPATIENT CHECKLIST  VTE Prophylaxis: enoxaparin  Central Lines: none. Tubes/Drains: none. Airways: none  Diet: Diet regular  Activity: as tolerated  Precautions: No infectious isolation, standard precautions.    ADVANCED DIRECTIVES:  Full Code, Primary Emergency Contact: [name],[name]    DISPOSITION:  Inpatient, Non-Monitored. Expected post-hospitalization disposition will be to home.    AUTHOR:  Marcy Siren, MD  05/06/2022 at 4:31 PM

## 2022-05-07 LAB — Basic Metabolic Panel
POTASSIUM: 4.1 mmol/L (ref 3.6–5.3)
TOTAL CO2: 20 mmol/L (ref 20–30)

## 2022-05-07 LAB — Blood Culture Detection
BLOOD CULTURE FINAL STATUS: NEGATIVE
BLOOD CULTURE FINAL STATUS: NEGATIVE

## 2022-05-07 LAB — Bacterial Enteric Pathogen Panel: CAMPYLOBACTER SPECIES: NOT DETECTED

## 2022-05-07 LAB — CBC: PLATELET COUNT, AUTO: 446 10*3/uL — ABNORMAL HIGH (ref 143–398)

## 2022-05-07 MED ORDER — METHOCARBAMOL 750 MG PO TABS
750 mg | Freq: Two times a day (BID) | ORAL
Start: 2022-05-07 — End: 2022-05-08
  Filled 2022-05-08: qty 60, 30d supply, fill #0

## 2022-05-07 MED ORDER — METHOCARBAMOL 750 MG PO TABS
750 mg | ORAL_TABLET | Freq: Two times a day (BID) | ORAL | 0 refills | 30.00 days | Status: AC
Start: 2022-05-07 — End: ?

## 2022-05-07 MED ORDER — AMOXICILLIN-POT CLAVULANATE 875-125 MG PO TABS
1 | ORAL_TABLET | Freq: Two times a day (BID) | ORAL | 0 refills | 2.00 days | Status: AC
Start: 2022-05-07 — End: ?

## 2022-05-07 MED ADMIN — METOCLOPRAMIDE HCL 5 MG PO TABS: 10 mg | ORAL | @ 13:00:00 | Stop: 2022-05-07 | NDC 51079088601

## 2022-05-07 MED ADMIN — ENOXAPARIN SODIUM 40 MG/0.4ML IJ SOSY: 40 mg | SUBCUTANEOUS | @ 16:00:00 | Stop: 2022-05-07

## 2022-05-07 MED ADMIN — PANTOPRAZOLE SODIUM 40 MG PO TBEC: 40 mg | ORAL | @ 16:00:00 | Stop: 2022-05-07 | NDC 68084081311

## 2022-05-07 MED ADMIN — QUETIAPINE FUMARATE 100 MG PO TABS: 100 mg | ORAL | @ 16:00:00 | Stop: 2022-05-07 | NDC 50268063211

## 2022-05-07 MED ADMIN — METHOCARBAMOL 750 MG PO TABS: 750 mg | ORAL | @ 03:00:00 | Stop: 2022-05-07 | NDC 60687056811

## 2022-05-07 MED ADMIN — HYDROMORPHONE HCL 1 MG/ML IJ SOLN: .2 mg | INTRAVENOUS | @ 11:00:00 | Stop: 2022-05-07 | NDC 00409128331

## 2022-05-07 MED ADMIN — OXYCODONE HCL 5 MG PO TABS: 5 mg | ORAL | @ 01:00:00 | Stop: 2022-05-07 | NDC 00406055262

## 2022-05-07 MED ADMIN — NICOTINE 21 MG/24HR TD PT24: 21 mg | TRANSDERMAL | @ 16:00:00 | Stop: 2022-05-07 | NDC 00536589688

## 2022-05-07 MED ADMIN — NICOTINE 21 MG/24HR TD PT24: 21 mg | TRANSDERMAL | @ 16:00:00 | Stop: 2022-05-07

## 2022-05-07 MED ADMIN — METOCLOPRAMIDE HCL 5 MG PO TABS: 10 mg | ORAL | @ 18:00:00 | Stop: 2022-05-07 | NDC 51079088601

## 2022-05-07 MED ADMIN — SUCRALFATE 1 GM/10ML PO SUSP: 1 g | ORAL | @ 18:00:00 | Stop: 2022-05-07 | NDC 50268073211

## 2022-05-07 MED ADMIN — QUETIAPINE FUMARATE 100 MG PO TABS: 100 mg | ORAL | @ 03:00:00 | Stop: 2022-05-07 | NDC 00904664061

## 2022-05-07 MED ADMIN — METHOCARBAMOL 750 MG PO TABS: 750 mg | ORAL | @ 13:00:00 | Stop: 2022-05-07 | NDC 60687056811

## 2022-05-07 MED ADMIN — SUCRALFATE 1 GM/10ML PO SUSP: 1 g | ORAL | @ 03:00:00 | Stop: 2022-05-07 | NDC 50268073211

## 2022-05-07 MED ADMIN — HYDROMORPHONE HCL 1 MG/ML IJ SOLN: .2 mg | INTRAVENOUS | @ 05:00:00 | Stop: 2022-05-12 | NDC 00409128331

## 2022-05-07 MED ADMIN — SUCRALFATE 1 GM/10ML PO SUSP: 1 g | ORAL | @ 01:00:00 | Stop: 2022-05-07 | NDC 50268073211

## 2022-05-07 MED ADMIN — OXYCODONE HCL 5 MG PO TABS: 5 mg | ORAL | @ 13:00:00 | Stop: 2022-05-07 | NDC 68084035411

## 2022-05-07 MED ADMIN — MULTI-VITAMINS PO TABS: 1 | ORAL | @ 16:00:00 | Stop: 2022-05-07 | NDC 00904053961

## 2022-05-07 MED ADMIN — SUCRALFATE 1 GM/10ML PO SUSP: 1 g | ORAL | @ 16:00:00 | Stop: 2022-05-07 | NDC 50268073211

## 2022-05-07 MED ADMIN — METHOCARBAMOL 750 MG PO TABS: 750 mg | ORAL | @ 18:00:00 | Stop: 2022-05-07 | NDC 60687056811

## 2022-05-07 MED ADMIN — METOCLOPRAMIDE HCL 5 MG PO TABS: 10 mg | ORAL | @ 01:00:00 | Stop: 2022-05-07 | NDC 51079088601

## 2022-05-07 MED ADMIN — AMOXICILLIN-POT CLAVULANATE 875-125 MG PO TABS: 1 | ORAL | @ 01:00:00 | Stop: 2022-05-07 | NDC 65862050320

## 2022-05-07 MED ADMIN — HYDROMORPHONE HCL 1 MG/ML IJ SOLN: .2 mg | INTRAVENOUS | @ 17:00:00 | Stop: 2022-05-07 | NDC 00409128331

## 2022-05-07 MED ADMIN — FLUOXETINE HCL 20 MG PO CAPS: 40 mg | ORAL | @ 16:00:00 | Stop: 2022-05-07 | NDC 00904578561

## 2022-05-07 MED ADMIN — OXYCODONE HCL 5 MG PO TABS: 5 mg | ORAL | @ 07:00:00 | Stop: 2022-05-07 | NDC 00406055262

## 2022-05-07 MED ADMIN — AMOXICILLIN-POT CLAVULANATE 875-125 MG PO TABS: 1 | ORAL | @ 16:00:00 | Stop: 2022-05-07 | NDC 65862050320

## 2022-05-07 MED ADMIN — TRAZODONE HCL 100 MG PO TABS: 100 mg | ORAL | @ 03:00:00 | Stop: 2022-05-07 | NDC 60687045411

## 2022-05-07 NOTE — Progress Notes
Pharmaceutical Services - Meds to [facility] Discharge Medication Reconciliation and Counseling Note    Patient Name: Donna Gross  Medical Record Number: [id number]  Admit date: 05/05/2022 4:30 PM    Age: 56 y.o.  Sex: female  Allergies: Hydrocodone, Ibuprofen, and Morphine    Preferred Pharmacy:   [id number] [place], [place] - [address] AT corner of [address]  [address] [place] [address]         I reconciled the discharge medications and counseled the patient on all new prescriptions. Discharge prescriptions were delivered to bedside. The purpose, potential side effects, storage, missed doses and any special instructions related to each medication was discussed. Medications continued from home were also reviewed with the patient.    Discharge Medication List from AVS:     Changes To My Medications        START taking these medications        Dose Instructions Notes   amoxicillin-clavulanate 875-125 mg tablet  Commonly known as: Augmentin   1 tablet   Take 1 tablet by mouth two (2) times daily for 2 days.             CHANGE how you take these medications        Dose Instructions Notes   oxyCODONE-acetaminophen 5-325 mg tablet  Commonly known as: Percocet  What changed: Another medication with the same name was removed. Continue taking this medication, and follow the directions you see here.   2 tablet   Take 2 tablets by mouth every eight (8) hours as needed for Severe Pain (Pain Scale 7-10). Max Daily Amount: 6 tablets             CONTINUE taking these medications        Dose Instructions Notes   acetaminophen 325 mg tablet  Commonly known as: Tylenol   650 mg   Take 2 tablets (650 mg total) by mouth every six (6) hours as needed for Pain.      albuterol 90 mcg/act inhaler  Commonly known as: Proventil HFA, Ventolin HFA   2 puff   Inhale 2 puffs every six (6) hours as needed for Wheezing or Shortness of Breath.      clobetasol 0.05% cream  Commonly known as: Temovate    Apply topically three (3) times daily as needed (rash).      CREON 36000 units DR capsule  Generic drug: pancrelipase (Lip-Prot-Amyl)   36,000 units of lipase   Take 1 capsule (36,000 units of lipase total) by mouth three (3) times daily with meals.      cyanocobalamin 1000 mcg tablet  Commonly known as: Cyanocobalamin   1,000 mcg   Take 1 tablet (1,000 mcg total) by mouth daily.      docusate 100 mg capsule  Commonly known as: docusate   100 mg   Take 1 capsule (100 mg total) by mouth two (2) times daily While on narcotic pain medication.      fluticasone 220 mcg/act inhaler  Commonly known as: Flovent HFA   1 puff   Inhale 1 puff daily.      methocarbamol 750 mg tablet  Commonly known as: Robaxin   750 mg   Take 1 tablet (750 mg total) by mouth two (2) times daily.      pantoprazole 40 mg DR tablet  Commonly known as: Protonix   40 mg   Take 1 tablet (40 mg total) by mouth  daily.      polyethylene glycol powder  Commonly known as: Glycolax   17 g   Mix 1 capful (17 g) with 8 oz of water and take by mouth daily.      prochlorperazine 10 mg tablet  Commonly known as: Compazine   10 mg   Take 1 tablet (10 mg total) by mouth three (3) times daily as needed for Nausea or Vomiting.      vitamin B complex capsule   1 capsule   Take 1 capsule by mouth daily.      Vitamin D3 50 mcg (2000 units) Tabs   50 mcg   Take 1 tablet (50 mcg total) by mouth daily.             STOP taking these medications        STOP taking these medications   FLUoxetine 40 mg capsule  Commonly known as: PROzac      fluticasone-salmeterol 250-50 mcg/dose diskus  Commonly known as: Advair      ipratropium-albuterol 20-100 mcg/act inhaler  Commonly known as: Respimat      metoclopramide 10 mg tablet  Commonly known as: Reglan      multivitamin tablet      naloxone 4 mg/0.1 mL nasal spray  Commonly known as: Narcan      ondansetron ODT 4 mg disintegrating tablet  Commonly known as: Zofran ODT      oxyCODONE 5 mg tablet      QUEtiapine 100 mg tablet  Commonly known as: SEROquel      sucralfate 1 g/10 mL suspension  Commonly known as: Carafate      traZODone 100 mg tablet                Prescriptions        These medications were sent to [facility] ([facility]) [phone])  [address] [place] [address]      Hours: Mon-Fri 8AM-6PM, Saturdays & Holidays 8AM-5PM (Closed 1PM-2PM for lunch); Closed Sundays Phone: [phone]   amoxicillin-clavulanate 875-125 mg tablet  methocarbamol 750 mg tablet         Lisabeth Register, PharmD, 05/07/2022, 4:41 PM

## 2022-05-07 NOTE — Other
Patient's Clinical Goal: pain mgt  Clinical Goal(s) for the Shift: hemodynamic stability, comfort, safety, pain mgt  Identify possible barriers to advancing the care plan: none  Stability of the patient: Moderately Unstable - medium risk of patient condition declining or worsening    Progression of Patient's Clinical Goal: In bed A/O x 4, cooperative. BMAT 4, independent. Non cardiac monitored, no c/o CP for the shift. On RA, no SOB. BP closely monitored for the shift, WNL. Pain managed well with PRN medications, pt able to rest well. Not in any form of acute distress at this time. Safety measures in place. Will give report to AM shift nurse. BP 95/60  ~ Pulse 93  ~ Temp 36.4 C (97.6 F) (Temporal)  ~ Resp 17  ~ Ht 1.626 m (5' 4'')  ~ Wt 54.4 kg (120 lb 0.3 oz)  ~ SpO2 94%  ~ BMI 20.60 kg/m

## 2022-05-07 NOTE — Progress Notes
IVPB magnesium ordered. MD notified to clarify if he would like to check mag labs today since last mag was a few days ago. instructions given to just go ahead and give mag

## 2022-05-07 NOTE — Other
Patient's Clinical Goal:   Clinical Goal(s) for the Shift: manage pain, safe discharge home  Identify possible barriers to advancing the care plan: none  Stability of the patient: Moderately Stable - low risk of patient condition declining or worsening   Progression of Patient's Clinical Goal: Order received for discharge. Pt agreeable to same.     AVS reviewed in detail. Pt stated she did not have methocarbamol at home. MD paged re same. Methocarbamol ordered for discharge.    Pt aware of follow up appt and changes to medication regimen. Denied further questions or concerns.     Awaiting medication delivery by discharge pharmacist.     Pt escorted off unit via wheelchair by discharge lounge RN at 12:17. Prescriptions to be delivered to pt in discharge lounge. Pt and discharge lounge RN aware of same. All belongings sent home w/ pt

## 2022-05-11 ENCOUNTER — Telehealth: Payer: PRIVATE HEALTH INSURANCE

## 2022-05-11 NOTE — Telephone Encounter
Discharge Followup Appointment Info    Is the patient being discharged to SNF?: No   TIme Frame: Within 7 Days   Sched with PCP?: Outside PCP   Barriers to Scheduling: PCP availability - unable to accommodate   Additional Appts/Tests/Info   Primary Care Dr. Autumn Patty on Wednesday 05/13/2022 at 3:30  [address] [place], [place] [address]  [phone]  Fax [phone]    Called patient at [phone] confirmed appointment. Fax Gastro referral. Patient confirmed. Per patient may reschedule appointment. Will do herself if needed

## 2022-05-18 ENCOUNTER — Inpatient Hospital Stay
Admit: 2022-05-18 | Discharge: 2022-05-18 | Disposition: A | Discharge status: Home / Self Care | Payer: PRIVATE HEALTH INSURANCE | Source: Home / Self Care

## 2022-05-18 ENCOUNTER — Ambulatory Visit: Payer: PRIVATE HEALTH INSURANCE

## 2022-05-18 DIAGNOSIS — R109 Unspecified abdominal pain: Secondary | ICD-10-CM

## 2022-05-18 LAB — Lipase: LIPASE: 94 U/L — ABNORMAL HIGH (ref 13–69)

## 2022-05-18 LAB — Differential Automated: ABSOLUTE EOS COUNT: 0.18 10*3/uL (ref 0.00–0.50)

## 2022-05-18 LAB — Basic Metabolic Panel
ESTIMATED GFR 2021 CKD-EPI: 83 mL/min/{1.73_m2} (ref 96–106)
SODIUM: 140 mmol/L (ref 135–146)

## 2022-05-18 LAB — CBC: PLATELET COUNT, AUTO: 453 10*3/uL — ABNORMAL HIGH (ref 143–398)

## 2022-05-18 LAB — Hepatic Funct Panel: ALKALINE PHOSPHATASE: 98 U/L (ref 37–113)

## 2022-05-18 LAB — Amylase: AMYLASE: 114 U/L (ref 31–124)

## 2022-05-18 LAB — Prothrombin Time Panel: PROTHROMBIN TIME: 12 s (ref 11.5–14.4)

## 2022-05-18 LAB — Extra Light Green Top

## 2022-05-18 MED ORDER — OXYCODONE-ACETAMINOPHEN 5-325 MG PO TABS
2 | ORAL_TABLET | ORAL | 0 refills | Status: AC | PRN
Start: 2022-05-18 — End: ?

## 2022-05-18 MED ORDER — ONDANSETRON 4 MG PO TBDP
4 mg | ORAL_TABLET | Freq: Four times a day (QID) | ORAL | 0 refills | Status: AC | PRN
Start: 2022-05-18 — End: ?

## 2022-05-18 MED ORDER — NALOXONE HCL 4 MG/0.1ML NA LIQD
1 refills | Status: AC
Start: 2022-05-18 — End: ?

## 2022-05-18 MED ADMIN — ONDANSETRON HCL 4 MG/2ML IJ SOLN: 4 mg | INTRAVENOUS | @ 16:00:00 | Stop: 2022-05-18 | NDC 60505613000

## 2022-05-18 MED ADMIN — HYDROMORPHONE HCL 1 MG/ML IJ SOLN: .5 mg | INTRAVENOUS | @ 19:00:00 | Stop: 2022-05-18 | NDC 00409128331

## 2022-05-18 MED ADMIN — IOHEXOL 350 MG/ML IV SOLN: 100 mL | INTRAVENOUS | @ 19:00:00 | Stop: 2022-05-18 | NDC 00407141491

## 2022-05-18 MED ADMIN — HYDROMORPHONE HCL 2 MG/ML IJ SOLN: .5 mg | INTRAVENOUS | @ 16:00:00 | Stop: 2022-05-18 | NDC 00409336511

## 2022-05-18 MED ADMIN — SODIUM CHLORIDE 0.9 % IV BOLUS: 1000 mL | INTRAVENOUS | @ 16:00:00 | Stop: 2022-05-18 | NDC 00338004904

## 2022-05-18 MED ADMIN — HYDROMORPHONE HCL 2 MG/ML IJ SOLN: .5 mg | INTRAVENOUS | @ 17:00:00 | Stop: 2022-05-18 | NDC 00409336511

## 2022-05-18 NOTE — Nursing Note
STAT RN:  Pt needs a PIV for CT contrast.   The existing PIV is 24g.   Attempted IV sticks 3 by ED nurse.   Ultrasound guide was used to place # 20 g PIV to the right AC with +dark blood return, using aseptic technique. Pt tolerated the IV insertion well. The IV insertion was done without any issues. Dressing applied and site secured. Notified primary nurse.      Jeannie Fend, RN

## 2022-05-18 NOTE — ED Notes
[facility]?NOTIFICATION?05/18/2022 07:28?Donna Gross?MRN: [id number]    [facility] Donna Gross's patient encounter information:   XBJ:[id number]  Account [id number]  Billing Account [id number]      Criteria Met      History of Sepsis Dx    Security and Safety  No Security Events were found.  ED Care Guidelines  There are currently no ED Care Guidelines for this patient. Please check your facility's medical records system.    Flags      History of Sepsis - Patient has received a diagnosis of Sepsis from an acute or post-acute setting. Apply appropriate clinical planning practices; to learn more visit [email or web address] / Attributed By: [employer] / Attributed On: 04/09/2022         Prescription Drug Data  No Prescription Drug Data was found.    E.D. Visit Count (12 mo.)  Facility Visits   Donna Gross [facility] 4   [facility] [place] 2   Donna Gross [facility] 3   Total 9   Note: Visits indicate total known visits.     Recent Emergency Department Visit Summary  Date Facility [facility] Type Diagnoses or Chief Complaint    May 18, 2022  [facility].  [place]  Emergency     Apr 01, 2022  [facility]  Don[place] Gross.  [place]  Chief Complaint: ABDOMINAL PAIN    Feb 13, 2022  [facility].  [place]  [place]gency      1. Calculus of gallbladder without cholecystitis without obstruction      3. Abdominal Pain      Dec 19, 2021  [facility].  [place]      1. Unspecified abdominal pain      2. Abdominal Pain      Dec 16, 2021  [name]  [place]  Chief Complaint: [name] AND ABD PAIN    Nov 08, 2021  [name]  [place]  [place]  Chief Complaint: CHEST PAIN    Nov 02, 2021  [name]  [place]  Chief Complaint: CHEST PAIN, AB PAIN ,BACK PAIN, VOMITING    Aug 07, 2021  [name]  [place]  [place]  Chief Complaint: CHEST PAIN/ AB PAIN/ VOMITING Jul 30, 2021  [facility]  Donna Gross.  [place]  [place]gency      Chronic obstructive pulmonary disease, unspecified      Benign paroxysmal vertigo, unspecified ear      Dizziness and giddiness      Allergy status to analgesic agent      Gastro-esophageal reflux disease without esophagitis      Personal history of other diseases of the digestive system      Anxiety disorder, unspecified      Depression, unspecified      Allergy status to narcotic agent        Recent Inpatient Visit Summary  Date Facility [facility] Type Diagnoses or Chief Complaint    Apr 25, 2022  [facility].  [place]  XRay      1. Nausea with vomiting, unspecified      2. Unspecified complication of procedure, initial encounter      3. Disease of biliary tract, unspecified      4. Other bacterial infections  of unspecified site      4. Peritoneal abscess      5. Extended spectrum beta lactamase (ESBL) resistance      7. Epigastric pain      8. Other disorders of electrolyte and fluid balance, not elsewhere classified      9. Unspecified abdominal pain      10. Abdominal Pain      Apr 01, 2022  [facility]  Don[place] Gross.  [place]  [place]patient      Unspecified intestinal obstruction, unspecified as to partial versus complete obstruction      Sepsis, unspecified organism      Hypo-osmolality and hyponatremia      Dehydration      Diaphragmatic hernia without obstruction or gangrene      Acquired absence of other specified parts of digestive tract      Other acute postprocedural pain      Hypokalemia      Peritoneal abscess      Gastro-esophageal reflux disease without esophagitis      Mar 27, 2022  [facility].  [place]  [place]gery      2. Pruritus, unspecified      3. Calculus of gallbladder without cholecystitis without obstruction      Nov 08, 2021  [name]  [place]  Medical Surgical  Chief Complaint: ACUTE PANCREATITIS    Aug 07, 2021  [name]  [place]  Chief Complaint: CHEST PAIN/ AB PAIN/ VOMITING      Care Team  [name] Phone Fax Service Dates   Sela Hilding, M.D. Family Medicine   Current      This patient has registered at the [facility] Emergency Department   For more information visit: [email or web address]   PLEASE NOTE:     1.   Any care recommendations and other clinical information are provided as guidelines or for historical purposes only, and providers should exercise their own clinical judgment when providing care.    2.   You may only use this information for purposes of treatment, payment or health care operations activities, and subject to the limitations of applicable [facility] Policies.    3.   You should consult directly with the organization that provided a care guideline or other clinical history with any questions about additional information or accuracy or completeness of information provided.    ? 2023 [employer] - [email or web address]

## 2022-05-18 NOTE — ED Notes
Pt verbalizes readiness for discharge, tolerating PO intake, able to ambulate to lobby with steady gait

## 2022-05-18 NOTE — ED Provider Notes
[facility]  Emergency Department Service Report    Donna Gross 56 y.o. female , presents with Abdominal Pain      Triage   Arrived on 05/18/2022 at 7:28 AM   Arrived by Walk-in [14]    ED Triage Vitals   Temp Temp Source BP Heart Rate Resp SpO2 O2 Device Pain Score Weight   05/18/22 0733 05/18/22 0733 05/18/22 0733 05/18/22 0733 -- 05/18/22 0733 -- 05/18/22 0730 05/18/22 0734   36.6 ?C (97.9 ?F) Oral 117/82 73  98 %  Ten 57 kg (125 lb 10.6 oz)       Pre hospital care:       Allergies   Allergen Reactions   ? Hydrocodone Other (See Comments)     ''burns a hole in stomach''  Tolerates hydromorphone   ? Ibuprofen Other (See Comments)     GI discomfort    ''hurts my stomach''   ? Morphine Itching     Tolerates hydromorphone       History   Patient is a 56 y.o. female with hx COPD, PUD c/b gastric outlet obstruction s/p multiples surgeries including vagotomy, hernia repair, colon perf s/p colostomy bag s/p reversal, gallstone pancreatitis s/p cholecystectomy 4/7 who presents to the ED with complaint of diffuse abdominal pain for the past few days. Pain is moderate, constant, worse with ambulating. Reports associated nausea and diarrhea. No vomiting. Hx of 7 abdominal surgeries, latest being cholecystectomy. Allergy to morphine, usually gets dilaudid.    The history is provided by the patient. No language interpreter was used.   Abdominal Pain  The primary symptoms of the illness include abdominal pain.          Facilities manager Used?: No                   Past Medical History:   Diagnosis Date   ? Anxiety    ? Depression    ? Emphysema, unspecified (HCC/RAF)    ? Emphysema, unspecified (HCC/RAF)    ? Fibromyalgia    ? Gastritis    ? GERD (gastroesophageal reflux disease)    ? Pancreatitis         Past Surgical History:   Procedure Laterality Date   ? ABDOMINAL SURGERY     ? COLON SURGERY          Past Family History   family history includes Diabetes in her brother and father; Heart disease in her mother.                 Past Social History   she reports that she has been smoking cigarettes. She uses smokeless tobacco. She reports that she does not drink alcohol, does not use drugs, and does not engage in sexual activity.       Physical Exam   Physical Exam  Vitals and nursing note reviewed.   Constitutional:       General: She is not in acute distress.     Appearance: She is well-developed.   HENT:      Head: Normocephalic and atraumatic.   Eyes:      Conjunctiva/sclera: Conjunctivae normal.   Cardiovascular:      Rate and Rhythm: Normal rate and regular rhythm.   Pulmonary:      Effort: Pulmonary effort is normal. No respiratory distress.      Breath sounds: Normal breath sounds.   Abdominal:      Palpations:  Abdomen is soft.      Tenderness: There is generalized abdominal tenderness. There is no guarding or rebound.      Comments: Multiple surgical scars  Hypoactive bowel sounds   Musculoskeletal:         General: Normal range of motion.      Cervical back: Normal range of motion and neck supple.   Lymphadenopathy:      Cervical: No cervical adenopathy.   Skin:     General: Skin is warm and dry.   Neurological:      Mental Status: She is alert.      Sensory: No sensory deficit.      Comments: Moving all four extremities.   Psychiatric:         Behavior: Behavior normal.         ED Course          Laboratory Results   Labs Reviewed - No data to display    Imaging Results     No orders to display       Administered Medications     Medication Administration from 05/18/2022 0728 to 05/18/2022 0816     None          Procedures   Procedural Sedation  Procedures    Medical Decision Making     Medical Decision Making  Patient is a 56 y.o. female with hx COPD, PUD c/b gastric outlet obstruction s/p multiples surgeries including vagotomy, hernia repair, colon perf s/p colostomy bag s/p reversal, gallstone pancreatitis s/p cholecystectomy 4/7 who presents to the ED with complaint of diffuse abdominal pain s/p cholecystectomy 4/7. Patient is afebrile. No tachycardia or tachypnea. O2 sat normal. Normotensive. Will do CT abd, labs, pain control and anti-emetics.    Amount and/or Complexity of Data Reviewed  External Data Reviewed: notes.     Details: Reviewed ED note 5/13 and discharge summary from 5/8. Pt had a cholecystectomy 4/7, was treated for an infection, and admitted from 5/3-5/8 for ERCP and biliary stent placement  Labs: ordered.  Radiology: ordered.      Risk  Prescription drug management.        Clinical Impression   No diagnosis found.      Prescriptions     New Prescriptions    No medications on file       Disposition and Follow-up   Disposition: Refresh note to pull in Disposition    No future appointments.    Follow up with:  No follow-up provider specified.    Return precautions are specified on After Visit Summary.    The documentation on this chart was performed by Marijean Bravo, scribed for Latrelle Dodrill, DO    05/18/2022 8:16 AM     ***

## 2022-05-22 NOTE — Discharge Summary
Discharge Summary   Name: Donna Gross MRN: [id number] DOB: 1966/07/01   Admit Date: 05/02/2022 D/C Date: 05/07/2022      LOS:   5 days    Admit Attending: Marcy Siren, MD Discharge Attending: Marcy Siren, MD    PCP: Kelle Darting, MD Discharge Provider: Marcy Siren, MD     Inpatient Care Team/ Consults:    Hospitalist Outpatient Care Team  No care team member to display     Admission Diagnosis:  (reason for admission)   Discharge Diagnosis:  (conditions treated during hospitalization)     Disposition:  Home or Self care      Discharge Condition:  stable       HPI:         Hospital Course:    Donna Gross is a 56 y.o. female with PMHx PUD c/b GOO s/p multiple surgeries including vagotomy, hernia repair, colon perforation s/p colostomy and reversal, gallstone pancreatitis s/p lap cholecystectomy (03/27/2022) c/b fluid collection s/p biliary drain, ERCP and biliary stent placement (04/22/2022), anxiety/depression, emphysema, fibromyalgia who presented with acute abdominal pain, found with colitis.  Patient was admitted and monitored.  She was started on antibiotics.  Her colitis improved.  Her diet was slowly advanced.  She tolerated it well.  Her pain was well controlled.  Per IV antibiotics were switched to p.o..  She remained hemodynamically stable without evidence of systemic infection.  On day of discharge she was breathing comfortably on room air.  She was discharged to home in stable condition with instruction to follow up with the PCP.  She understood and agreed with this discharge plan.  ?  #Acute abdominal Pain, POA  #Colitis, POA  #Hx multiple abd surgeries, POA  #Hx recent lab chole (03/27/2022) c/b fluid collection s/p biliary drain, POA  #Hx recent biliary stent (04/22/2022), POA  - Patient recently underwent CT imaging 2/2 recent stent placement w/ no findings of free air, stent migration, or bile leak. No abscess noted. Colitis suspected given thickening of bowel wall in the transverse colon- does appear new compared to prior recent CT  - Defer surgery and GI consults for now given imaging above  5/17  - dc IV CTX, flagyl (5/13-) , start augmentin 5/16-  - diet advanced to regular 5/15- tolerating   - one bm last 24 hours  - Tylenol/oxycodonel for pain, dc IV dilaudid  - Zofran 4 mg q8h PRN ?+ regal 10 mg TID before meals  - C/S to Physician Nutrition Services  - Methocarbamol TID  - Ova/Parasite Studies  - Bacterial enteropathogen panel neg 5/6  - Parasitic enteropathogen panel  - Continue Protonix 40 mg qDay  - Continue Sucralfate 1 g TID  ?  ?  #HypoMg, POA  - IV Mg repletion, trend  ?  CHRONIC/STABLE:  #Anxiety, POA   #Depression, POA - see treatment modalities below?  #Fibromyalgia - Continue Fluoxetine 40 mg qday?  #Insomnia, POA - Continue Seroquel 100 mg BID + Trazodone 100 mg at bedtime, Melatonin also available as PRN?  #Tobacco Dependence - Nicotine patch?    Procedures & Operations Performed:     Relevant Imaging Studies (most recent results only):   ct scan a/p    Relevant labs:       CBC:  Lab Results   Component Value Date    HCT 37.1 05/18/2022    HGB 11.3 (L) 05/18/2022    PLT 453 (H) 05/18/2022    WBC 5.56 05/18/2022     LFT:  Lab Results   Component Value Date    ALBUMIN 4.2 05/18/2022    ALKPHOS 98 05/18/2022    ALT 7 (L) 05/18/2022    AST 25 05/18/2022    BILITOT 0.3 05/18/2022    TOTPRO 7.5 05/18/2022       BMP:  Lab Results   Component Value Date    BUN 10 05/18/2022    CALCIUM 9.5 05/18/2022    CL 107 (H) 05/18/2022    CO2 20 05/18/2022    CREAT 0.82 05/18/2022    GLUCOSE 87 05/18/2022    K 4.0 05/18/2022    NA 140 05/18/2022       Cal/iCal/MG/Ph:  Lab Results   Component Value Date    CALCIUM 9.5 05/18/2022     Coags:  Lab Results   Component Value Date    INR 0.9 05/18/2022    PT 12.0 05/18/2022             None        Physical Exam:   BP 118/78  ~ Pulse 75  ~ Temp 36.8 ?C (98.3 ?F)  ~ Resp 17  ~ Ht 1.626 m (5' 4'')  ~ Wt 54.4 kg (120 lb 0.3 oz)  ~ SpO2 (!) 92% ~ BMI 20.60 kg/m?   General: alert, well appearing, and in no distress.  Oropharynx: mucous membranes moist, oropharynx clear   Neck: supple, no significant adenopathy  Cardiac: Regular rate and rhythm, no murmurs/rubs/gallops.   Lungs: Clear to auscultation with normal work of breathing  Abdomen: soft, nondistended, midline surgical scars noted  Extremities: Warm, well perfused. No LE edema  Skin: warm and dry, no significant rashes or lesions  Neuro: No focal deficits  ?            Outpatient Provider To-Do List         LACE+ Score: 56 (05/07/22 1222) Moderate Risk of Readmission  Better Outcomes by Optimizing Safe Transitions        This patient meets criteria for the following BOOST risk conditions which could lead to readmission. Please consider further evaluation or action on the potential barriers below:    Problems with Medications:      ?                  Pending Labs   The following tests have been collected, but not yet resulted at the time of discharge.    Unresulted Labs (From admission, onward)    None        The following labs have a preliminary result at the time of discharge.  Please check back if the final results have changed.  Preliminary Resulted Labs (From admission, onward)    None        Discharge Medication List  Coumadin and Opiate Risk Assessment   This patient does not have coumadin on their discharge med list  No controlled substances on discharge med list   Discharge Medication List as of 05/07/2022 12:10 PM      START taking these medications    Details   amoxicillin-clavulanate 875-125 mg tablet Take 1 tablet by mouth two (2) times daily for 2 days., Starting Thu 05/07/2022, Until Sat 05/09/2022, Normal         CONTINUE these medications which have CHANGED    Details   methocarbamol 750 mg tablet Take 1 tablet (750 mg total) by mouth two (2) times daily., Starting Thu 05/07/2022, Until Sat 06/06/2022, Normal  CONTINUE these medications which have NOT CHANGED    Details acetaminophen 325 mg tablet Take 2 tablets (650 mg total) by mouth every six (6) hours as needed for Pain., Starting Tue 03/31/2022, No Print      albuterol 90 mcg/act inhaler Inhale 2 puffs every six (6) hours as needed for Wheezing or Shortness of Breath., Historical Med      B Complex Vitamins (VITAMIN B COMPLEX) capsule Take 1 capsule by mouth daily., Historical Med      Cholecalciferol (VITAMIN D3) 50 mcg (2000 units) TABS Take 1 tablet (50 mcg total) by mouth daily., Historical Med      cyanocobalamin 1000 mcg tablet Take 1 tablet (1,000 mcg total) by mouth daily., Historical Med      docusate 100 mg capsule Take 1 capsule (100 mg total) by mouth two (2) times daily While on narcotic pain medication., Starting Tue 03/31/2022, Until Wed 03/31/2023, Normal      fluticasone 220 mcg/act inhaler Inhale 1 puff daily., Historical Med      pancrelipase, Lip-Prot-Amyl, (CREON) 36000 units DR capsule Take 1 capsule (36,000 units of lipase total) by mouth three (3) times daily with meals., Historical Med      pantoprazole 40 mg DR tablet Take 1 tablet (40 mg total) by mouth daily., Historical Med      prochlorperazine 10 mg tablet Take 1 tablet (10 mg total) by mouth three (3) times daily as needed for Nausea or Vomiting., Historical Med      oxyCODONE-acetaminophen 5-325 mg tablet Take 2 tablets by mouth every eight (8) hours as needed for Severe Pain (Pain Scale 7-10). Max Daily Amount: 6 tablets, Starting Mon 04/27/2022, Normal      clobetasol 0.05% cream Apply topically three (3) times daily as needed (rash)., Historical Med      polyethylene glycol powder Mix 1 capful (17 g) with 8 oz of water and take by mouth daily., Starting Mon 04/27/2022, Normal         STOP taking these medications       FLUoxetine 40 mg capsule        fluticasone-salmeterol 250-50 mcg/dose diskus        ipratropium-albuterol 20-100 mcg/act inhaler        metoclopramide 10 mg tablet        multivitamin tablet        Naloxone HCl 4 MG/0.1ML LIQD ondansetron ODT 4 mg disintegrating tablet        oxyCODONE 5 mg tablet        quetiapine 100 mg tablet        sucralfate 1 g/10 mL suspension        traZODone 100 mg tablet             Requested Appointments   We will schedule and contact you with follow up appointment(s)   As   directed      Is the patient being discharged to SNF? No  Clinic/Test/Procedure to be scheduled:colonoscopy for ongoing diarrhea  Doctor Preferences (If applicable): Kelle Darting, MD ,   Gastroenterology   Time Frame: within 1-2 weeks  Reason for Appointment/Procedure: pcp follow up, GI follow up for   colitis/diarrhea, consideration of colonoscopy    We will schedule follow up appointment(s) and call you with appointment   time(s)   As directed      Is the patient being discharged to SNF? No  Clinic/Test/Procedure to be scheduled:PCP  Doctor Preferences (If applicable): Kelle Darting, MD  Time Frame: within 1 week  Reason for Appointment/Procedure:  Post hospitalization follow-up after   colitis      Scheduled Appointments  No future appointments.       Nutrition Recommendations & Malnutrition Assessment   I have seen and examined the patient and agree with the RD assessment detailed below:  Patient meets criteria for: Severe protein calorie malnutrition    (current weight 54.4 kg (120 lb 0.3 oz), BMI (Calculated): 20.6; IBW: 54.4 kg (120 lb), % Ideal Body Weight: 100 %). See RD notes for additional details.         The patient was not consulted or assessed by a Registered Dietitian (RD) during this admission.  Discharge Diet Orders:  Regular: all foods are allowed   As directed        Skin/Wound         Discharge Activity Orders    Okay to be physically active   As directed        Rehab Assessment   No PT evaluation this encounter          No OT evaluation this encounter          Case Manager/Home Health Assessment   Discharge Information  Discharge Address: [address]   [place] [address] (05/05/22 1413)              Home Health Orders  There are no active discharge home health orders for this encounter.   Goals of Care Note (if new for this encounter)     I have seen and examined the patient on their discharge day. I have reviewed, edited, and agree with the above discharge summary. More than 30 minutes was personally spent on discharge planning on the day of discharge in coordination of care, counseling the patient and preparation of discharge.  Marcy Siren, MD  05/22/2022 3:40 PM

## 2022-05-24 MED ORDER — ACETAMINOPHEN EXTRA STRENGTH 500 MG PO TABS
ORAL_TABLET | 0 refills
Start: 2022-05-24 — End: ?

## 2022-06-01 ENCOUNTER — Telehealth: Payer: PRIVATE HEALTH INSURANCE

## 2022-06-01 NOTE — Telephone Encounter
Left a message to call and schedule procedure with [facility].

## 2022-06-09 ENCOUNTER — Inpatient Hospital Stay: Payer: PRIVATE HEALTH INSURANCE

## 2022-06-17 ENCOUNTER — Ambulatory Visit: Payer: PRIVATE HEALTH INSURANCE

## 2022-06-17 ENCOUNTER — Inpatient Hospital Stay
Admit: 2022-06-17 | Discharge: 2022-06-17 | Disposition: A | Discharge status: Home / Self Care | Payer: PRIVATE HEALTH INSURANCE | Source: Home / Self Care

## 2022-06-17 DIAGNOSIS — R197 Diarrhea, unspecified: Secondary | ICD-10-CM

## 2022-06-17 LAB — Lipase: LIPASE: 39 U/L (ref 13–69)

## 2022-06-17 LAB — Comprehensive Metabolic Panel
ALBUMIN: 4.7 g/dL (ref 3.9–5.0)
TOTAL CO2: 14 mmol/L — ABNORMAL LOW (ref 20–30)

## 2022-06-17 LAB — High Sensitivity Troponin I: HIGH SENSITIVITY TROPONIN I: 5 ng/L — ABNORMAL HIGH (ref <5)

## 2022-06-17 LAB — Basic Metabolic Panel
CALCIUM: 9.8 mg/dL (ref 8.6–10.4)
ESTIMATED GFR 2021 CKD-EPI: 56 mL/min/{1.73_m2} (ref 7–22)

## 2022-06-17 LAB — CBC: PLATELET COUNT, AUTO: 461 10*3/uL — ABNORMAL HIGH (ref 143–398)

## 2022-06-17 MED ORDER — NALOXONE HCL 4 MG/0.1ML NA LIQD
4 mg | Freq: Once | NASAL | 0 refills | Status: AC | PRN
Start: 2022-06-17 — End: ?

## 2022-06-17 MED ORDER — ONDANSETRON 4 MG PO TBDP
4 mg | ORAL_TABLET | Freq: Four times a day (QID) | ORAL | 0 refills | 8.00000 days | Status: SS | PRN
Start: 2022-06-17 — End: ?

## 2022-06-17 MED ORDER — OXYCODONE-ACETAMINOPHEN 5-325 MG PO TABS
2 | ORAL_TABLET | ORAL | 0 refills | Status: SS | PRN
Start: 2022-06-17 — End: ?

## 2022-06-17 MED ADMIN — SODIUM CHLORIDE 0.9 % IV BOLUS: 1000 mL | INTRAVENOUS | @ 18:00:00 | Stop: 2022-06-17 | NDC 00338004904

## 2022-06-17 MED ADMIN — HYDROCODONE-ACETAMINOPHEN 5-325 MG PO TABS: 1 | ORAL | @ 21:00:00 | Stop: 2022-06-17 | NDC 68084089511

## 2022-06-17 MED ADMIN — HYDROMORPHONE HCL 1 MG/ML IJ SOLN: 1 mg | INTRAVENOUS | @ 18:00:00 | Stop: 2022-06-17 | NDC 00409128331

## 2022-06-17 MED ADMIN — ONDANSETRON HCL 4 MG/2ML IJ SOLN: 4 mg | INTRAVENOUS | @ 18:00:00 | Stop: 2022-06-17 | NDC 60505613000

## 2022-06-17 NOTE — Discharge Instructions
Emergency Department Discharge Instructions      Summary of your visit  You have been evaluated in the [facility] Emergency Department today for abdominal pain.  Your evaluation included a physical exam and labs.  We have not found a serious cause of the abdominal pain at this time. However, some abdominal problems may take more time to appear. Therefore, it is important that you carefully watch for changes in the abdominal pain (such as any new symptoms or worsening of your current condition).     Follow-Up  Please follow up with your primary care physician within three days after being discharged from the ER - you can call to schedule an appointment.  You can find a primary care physician at [facility] by calling [phone].    You can also follow-up with a gastroenterologist at [facility] by calling [phone].  If you do not have a Primary Care Physician, please call your insurance company or you may call [phone] to establish care with a [facility] physician.  If you are uninsured, please call 2-1-1 to find a free or low-cost clinic in your area. 2-1-1 [place] is the central source for providing information and referrals for all health and human services in [place]. Our 2-1-1 phone line is open 24 hours, 7 days a week, with trained Community Resource Advisors prepared to offer help with any situation, any time. Our community services go far beyond phone referrals - explore our website to learn more. If you are calling from outside [place] or cannot directly dial 2-1-1, you can call [phone].      Return to the Emergency Department if you experience:  Fevers 100.4 F or greater  Worsening or uncontrolled pain  Persistent nausea and vomiting  Inability to tolerate food or fluids by mouth  Bloody stools or vomit  Black or tarry stools  Any other concerning symptoms     Thank you for choosing [facility] for your care. It was a pleasure taking part in your care today, and we wish you the best!

## 2022-06-17 NOTE — ED Notes
Report given to Ashlegh RN for meal coverage.

## 2022-06-17 NOTE — ED Provider Notes
[facility]  Emergency Department Service Report    Donna Gross 56 y.o. female , presents with Diarrhea and Chest Pain      Triage   Arrived on 06/17/2022 at 10:20 AM   Arrived by Wheelchair [4]    ED Triage Vitals [06/17/22 1025]   Temp Temp Source BP Heart Rate Resp SpO2 O2 Device Pain Score Weight   36.6 ?C (97.9 ?F) Oral 125/86 (!) 102 (!) 28 99 % None (Room air) Ten --       Pre hospital care:       Allergies   Allergen Reactions   ? Hydrocodone Other (See Comments)     ''burns a hole in stomach''  Tolerates hydromorphone   ? Ibuprofen Other (See Comments)     GI discomfort    ''hurts my stomach''   ? Morphine Itching     Tolerates hydromorphone       History   HPI     Donna Gross is a 56 y.o. female with PMHx below who presents with diarrhea x one week. She reports it was black a week ago, and now appears a brownish color and has ''bubbles.'' Also endorses CP and upper abdominal pain and DOE. Rates pain 10/10. Is not on abx. Pt states she has not had anything to eat for five days. She was admitted from 5/13-5/18 with colitis s/p cholecystectomy on 03/27/22. Had stool sample that was negative. She is not taking anything for pain at home. She is concerned for bacterial or parasitic infection.         Past Medical History:   Diagnosis Date   ? Anxiety    ? Depression    ? Emphysema, unspecified (HCC/RAF)    ? Emphysema, unspecified (HCC/RAF)    ? Fibromyalgia    ? Gastritis    ? GERD (gastroesophageal reflux disease)    ? Pancreatitis         Past Surgical History:   Procedure Laterality Date   ? ABDOMINAL SURGERY     ? COLON SURGERY          Past Family History   family history includes Diabetes in her brother and father; Heart disease in her mother.                 Past Social History   she reports that she has been smoking cigarettes. She uses smokeless tobacco. She reports that she does not drink alcohol, does not use drugs, and does not engage in sexual activity.       Physical Exam Physical Exam  Vitals and nursing note reviewed.   Constitutional:       General: She is not in acute distress.     Appearance: Normal appearance.   HENT:      Head: Normocephalic and atraumatic.      Nose: Nose normal.      Mouth/Throat:      Pharynx: Oropharynx is clear.   Eyes:      Extraocular Movements: Extraocular movements intact.      Conjunctiva/sclera: Conjunctivae normal.      Pupils: Pupils are equal, round, and reactive to light.   Cardiovascular:      Rate and Rhythm: Normal rate and regular rhythm.      Pulses: Normal pulses.   Pulmonary:      Effort: Pulmonary effort is normal. No respiratory distress.      Breath sounds: Normal breath sounds.   Abdominal:  General: Abdomen is flat.      Palpations: Abdomen is soft.      Tenderness: There is no abdominal tenderness. There is no guarding or rebound.   Musculoskeletal:         General: Normal range of motion.      Cervical back: Normal range of motion and neck supple.   Lymphadenopathy:      Cervical: No cervical adenopathy.   Skin:     General: Skin is warm and dry.      Capillary Refill: Capillary refill takes less than 2 seconds.   Neurological:      Mental Status: She is alert. Mental status is at baseline.      Sensory: No sensory deficit.   Psychiatric:         Behavior: Behavior normal.         ED Course     ED Course as of 06/17/22 1258   Wed Jun 17, 2022   1036 EKG at 1033  Sinus rhythm  Rate 89  Short PR interval  No delta wave  Qtc 428 [BC]   1247 Discussed lab results with the patient, repeat BMP, recommend follow up with Dr. Selena Batten as scheduled on 07/02/2022 [BC]      ED Course User Index  [BC] Vinetta Bergamo., DO       Laboratory Results     Labs Reviewed   CBC - Abnormal; Notable for the following components:       Result Value    [facility] Concentration 30.9 (*)     Red Cell Distribution Width-SD 53.4 (*)     Red Cell Distribution Width-CV 17.0 (*)     Platelet Count, Auto 461 (*)     Mean Platelet Volume 8.2 (*)     All other components within normal limits   COMPREHENSIVE METABOLIC PANEL - Abnormal; Notable for the following components:    Sodium 133 (*)     Total CO2 14 (*)     Anion Gap 21 (*)     Alanine Aminotransferase 7 (*)     All other components within normal limits   HIGH SENSITIVITY TROPONIN I - Abnormal; Notable for the following components:    High Sensitivity Troponin I 5 (*)     All other components within normal limits   LIPASE - Normal   URINALYSIS W/REFLEX TO CULTURE    Narrative:     The following orders were created for panel order Urinalysis w/Reflex to Culture.  Procedure                               Abnormality         Status                     ---------                               -----------         ------                     UA,Dipstick[[id number]]  UA,Microscopic[[id number]]                                                                Please view results for these tests on the individual orders.   UA,DIPSTICK   UA,MICROSCOPIC   BASIC METABOLIC PANEL       Imaging Results     XR chest ap portable (1 view)   Final Result by Albin Fischer, MD (06/28 1139)   IMPRESSION:        Heart size is normal. Atherosclerotic calcific patient aortic arch with stable mediastinal contours.   Underlying hyperinflation. No discrete airspace consolidation or edema.   Ill-defined opacities projecting over bilateral lung bases, most compatible with clothing artifact.   No pleural effusion or pneumothorax..   No acute osseous abnormalities.            Signed by: Albin Fischer   06/17/2022 11:39 AM          Administered Medications     Medication Administration from 06/17/2022 1020 to 06/17/2022 1232       Date/Time Order Dose Route Action Action by Comments     06/17/2022 1113 PDT sodium chloride 0.9% IV soln bolus 1,000 mL 1,000 mL Intravenous New Bag/ Syringe/ Cartridge Malissa Hippo, RN --     06/17/2022 1113 PDT ondansetron 4 mg/2 mL inj 4 mg 4 mg Intravenous Given Malissa Hippo, RN --     06/17/2022 1113 PDT HYDROmorphone 1 mg/mL inj 1 mg 1 mg IV Push Given Wavrin, Corine Shelter, RN --          Procedures   Procedural Sedation  Procedures    Medical Decision Making   Donna Gross is a 56 y.o. female presents with diarrhea and abdominal cramping suggestive of gastroenteritis vs pancreatitis vs gastritis. Doubt SBO. Doubt C.diff colitis given lack of abx use. Will check    Cbc  Bmp  LFT  IVF  Follow up with GI as scheduled in 2 weeks    MDM  Clinical Impression   No diagnosis found.      Prescriptions     New Prescriptions    No medications on file       Disposition and Follow-up   Disposition: Refresh note to pull in Disposition    No future appointments.    Follow up with:  No follow-up provider specified.    Return precautions are specified on After Visit Summary.    Scribe Signature   I, [employer], have acted as a Stage manager for patient Donna Gross on behalf of Vinetta Bergamo., DO at 06/17/2022 at 10:51 AM. All documentation underwent a comprehensive review by the listed physician(s) and received their approval upon signing.

## 2022-06-17 NOTE — ED Notes
Report received from [facility], pt sleeping in bed, breathing equal and unlabored, vital signs stable.

## 2022-06-17 NOTE — ED Notes
[facility]?NOTIFICATION?06/17/2022 10:20?Donna Gross?MRN: [id number]    [facility] Donna Gross's patient encounter information:   WJX:?[id number]  Account [id number]  Billing Account [id number]      Criteria Met      2 Visits in 30 Days    6 Visits in 180 Days    History of Sepsis Dx    Security and Safety  No Security Events were found.  ED Care Guidelines  There are currently no ED Care Guidelines for this patient. Please check your facility's medical records system.    Flags      History of Sepsis - Patient has received a diagnosis of Sepsis from an acute or post-acute setting. Apply appropriate clinical planning practices; to learn more visit [email or web address].info/ / Attributed By: [employer] / Attributed On: 04/09/2022         Prescription Drug Data  No Prescription Drug Data was found.    E.D. Visit Count (12 mo.)  Facility Visits   [facility] 4   [facility] 3   Donna Gross [facility] 4   Total 11   Note: Visits indicate total known visits.     Recent Emergency Department Visit Summary  Showing 10 most recent visits out of 11 in the past 12 months   Date Facility Donna Gross Type Diagnoses or Chief Complaint    Jun 17, 2022  [facility]  [place]     May 19, 2022  [facility]  [place]gency      Unspecified abdominal pain      Chronic obstructive pulmonary disease with (acute) exacerbation      Other long term (current) drug therapy      Acquired absence of other specified parts of digestive tract      May 18, 2022  [facility]  [place]      1. Unspecified abdominal pain      1. Abdominal Pain      Apr 01, 2022  [facility]  [place]  [place]gency  Chief Complaint: ABDOMINAL PAIN    Feb 13, 2022  [facility].  [place]      1. Calculus of gallbladder without cholecystitis without obstruction      1. Abdominal Pain      Dec 19, 2021  [facility].  [place] 1. Abdominal Pain      1. Unspecified abdominal pain      Dec 16, 2021  [facility]  [place]  Chief Complaint: [name] AND ABD PAIN    Nov 08, 2021  [facility]  [place]  Chief Complaint: CHEST PAIN    Nov 02, 2021  [facility]  [place]  Chief Complaint: CHEST PAIN, AB PAIN ,BACK PAIN, VOMITING    Aug 07, 2021  [facility]  [place]  Chief Complaint: CHEST PAIN/ AB PAIN/ VOMITING      Recent Inpatient Visit Summary  Date Facility Donna Gross Type Diagnoses or Chief Complaint    Apr 25, 2022  [facility] [facility]  [place]      1. Nausea with vomiting, unspecified      2. Unspecified complication of procedure,  initial encounter      3. Disease of biliary tract, unspecified      4. Other bacterial infections of unspecified site      4. Peritoneal abscess      5. Extended spectrum beta lactamase (ESBL) resistance      7. Epigastric pain      8. Other disorders of electrolyte and fluid balance, not elsewhere classified      9. Unspecified abdominal pain      10. Abdominal Pain      Apr 01, 2022  [facility]  [facility] [facility].  [place]      Unspecified intestinal obstruction, unspecified as to partial versus complete obstruction      Sepsis, unspecified organism      Hypo-osmolality and hyponatremia      Dehydration      Diaphragmatic hernia without obstruction or gangrene      Acquired absence of other specified parts of digestive tract      Other acute postprocedural pain      Hypokalemia      Peritoneal abscess      Gastro-esophageal reflux disease without esophagitis      Mar 27, 2022  [facility].  [place]  Surgery      2. Pruritus, unspecified      3. Calculus of gallbladder without cholecystitis without obstruction      Nov 08, 2021  [facility]  [place]  [place]  Chief Complaint: ACUTE PANCREATITIS    Aug 07, 2021  [facility]  [place] [facility] [place]  Chief Complaint: CHEST PAIN/ AB PAIN/ VOMITING      Care Team  [facility]na [facility] Specialty Phone Fax Service Dates   [facility] [phone] Current    Sela Hilding, M.D. Family Medicine   Current      [facility]  This patient has registered at the [facility]   For more information visit: [email or web address]   PLEASE NOTE:     1.   Any care recommendations and other clinical information are provided as guidelines or for historical purposes only, and providers should exercise their own clinical judgment when providing care.    2.   You may only use this information for purposes of treatment, payment or health care operations activities, and subject to the limitations of applicable [facility] Policies.    3.   You should consult directly with the organization that provided a care guideline or other clinical history with any questions about additional information or accuracy or completeness of information provided.    ? 2023 [employer] - [email or web address]

## 2022-06-19 MED ORDER — OXYCODONE-ACETAMINOPHEN 5-325 MG PO TABS
ORAL_TABLET | 0 refills | 2.00000 days | Status: SS
Start: 2022-06-19 — End: ?
  Filled 2022-06-20: qty 15, 2d supply, fill #0

## 2022-06-22 ENCOUNTER — Telehealth: Payer: PRIVATE HEALTH INSURANCE

## 2022-06-22 NOTE — Telephone Encounter
Patient Phone Messages:     MD office returned call   x Patient returned call    Patient called with questions regarding surgery     Comments:   Pt returned call

## 2022-07-01 ENCOUNTER — Ambulatory Visit: Payer: PRIVATE HEALTH INSURANCE

## 2022-07-01 MED ORDER — CHOLESTYRAMINE 4 GM/DOSE PO POWD
4 g | Freq: Three times a day (TID) | ORAL | 3 refills | Status: AC
Start: 2022-07-01 — End: ?

## 2022-07-01 MED ADMIN — FENTANYL CITRATE (PF) 100 MCG/2ML IJ SOLN: 50 ug | INTRAVENOUS | @ 23:00:00 | Stop: 2022-07-02 | NDC 00409909412

## 2022-07-01 MED ADMIN — DEXAMETHASONE SODIUM PHOSPHATE 4 MG/ML IJ SOLN: INTRAVENOUS | @ 22:00:00 | Stop: 2022-07-01 | NDC 67457042312

## 2022-07-01 MED ADMIN — ONDANSETRON HCL 4 MG/2ML IJ SOLN: INTRAVENOUS | @ 22:00:00 | Stop: 2022-07-01 | NDC 60505613005

## 2022-07-01 MED ADMIN — SUGAMMADEX SODIUM 200 MG/2ML IV SOLN: INTRAVENOUS | @ 22:00:00 | Stop: 2022-07-01 | NDC 00006542312

## 2022-07-01 MED ADMIN — LIDOCAINE HCL (CARDIAC) 100 MG/5ML IV SOSY: INTRAVENOUS | @ 22:00:00 | Stop: 2022-07-01 | NDC 76329339001

## 2022-07-01 MED ADMIN — PROPOFOL 200 MG/20ML IV EMUL: INTRAVENOUS | @ 22:00:00 | Stop: 2022-07-01 | NDC 63323026929

## 2022-07-01 MED ADMIN — FENTANYL CITRATE (PF) 100 MCG/2ML IJ SOLN: INTRAVENOUS | @ 22:00:00 | Stop: 2022-07-01 | NDC 00409909422

## 2022-07-01 MED ADMIN — MIDAZOLAM HCL 10 MG/10ML IJ SOLN: INTRAVENOUS | @ 22:00:00 | Stop: 2022-07-01 | NDC 00409258705

## 2022-07-01 MED ADMIN — PROCHLORPERAZINE EDISYLATE 10 MG/2ML IJ SOLN: 5 mg | INTRAVENOUS | @ 23:00:00 | Stop: 2022-07-01 | NDC 23155029442

## 2022-07-01 MED ADMIN — ROCURONIUM BROMIDE 50 MG/5ML IV SOLN: INTRAVENOUS | @ 22:00:00 | Stop: 2022-07-01 | NDC 39822420002

## 2022-07-01 MED ADMIN — PLASMA-LYTE A IV SOLN: 50 mL/h | INTRAVENOUS | @ 22:00:00 | Stop: 2022-07-02 | NDC 00338022104

## 2022-07-01 MED ADMIN — HYDROMORPHONE HCL 1 MG/ML IJ SOLN: .2 mg | INTRAVENOUS | @ 23:00:00 | Stop: 2022-07-02 | NDC 00409128331

## 2022-07-01 NOTE — Discharge Instructions
PROCEDURE: []  EGD   []  Colonoscopy    []  Flexible Sigmoidoscopy      []  EUS   [x]  ERCP             []  Other:    FINDINGS: Please see procedure report for details.    ACTIVITY:    [x]  Avoid any strenuous physical activity for 24 hours    [x]  No driving or operating heavy machinery for 24 hours    [x]  No alcoholic beverages for 24 hours (may interact with some medications you received during your procedure)    DIET:    [x]  Resume your usual diet    []  Specific diet instructions:     Call [phone] and ask for Dr. Eloisa Northern if you experience any of the following:      [x]  Severe chest pain, difficulty breathing    [x]  Passage of blood clots, black tarry stools, vomiting blood    [x]  Severe inflammation at the I.V. site    [x]  Difficulty swallowing or persistent vomiting    [x]  Abdominal pain    [x]  Chills or fever greater than 101 F or 38.4 C within 24 hours of procedure    FOLLOW-UP CARE: Follow up with your primary care physician as planned.      Here are the Discharge Instructions to follow after your procedure:  DIET: Your diet may be based on your own preference:  Begin with clear liquids such as soda, apple juice, tea, etc. DO NOT use a straw for drinking as  this increases the amount of air swallowed and can increase bloating and stomach distress.  If liquids are tolerated, progress slowly to your usual diet.  NOTE: Intermittent nausea, with or without vomiting, is a common side effect following surgery  with general anesthesia. If this occurs, DO NOT eat or drink anything until it has subsided. If the  nausea or vomiting persists for more than 24 hours, contact your physician or go to the nearest  emergency room.  ACTIVITY: You may gradually return to your usual activity (if not restricted by your surgeon).  Sedative medications may linger in the body for 24-48 hour. It is not uncommon to feel somewhat  drowsy and/or dizzy the remainder of the day following surgery. Avoid bending over as this may  enhance dizziness. Do not drive an automobile or operate any heavy machinery for 24 hours. Do  not make sudden movements when changing position, such as from a reclining to upright position.  Do not sign any legal documents or make any important decisions for 24 hours.  Do not drink alcohol for 24 hours.  ADDITIONAL INSTRUCTIONS: It is not uncommon to tire easily and/or feel exhausted the day  following surgery. Do not be alarmed as this is normal. Plan activities that will allow for rest  periods as needed.  For your safety, we strongly advise that a responsible adult remain with you for the rest of the  surgical day and night. Also, you should not be responsible for the care of others (children/adults)  for 24 hours post-anesthesia and a responsible adult should be present to assume these  responsibilities.  You should take several deep breaths and give several forceful coughs every hour during the initial  post-operative day. This will help to clear mucus and keep the lungs appropriately inflated.  If you need advice or have any questions regarding ANESTHESIA, please call the appropriate  number and ask to speak to an available anesthesiologist:  (  [phone] - Weekdays until 5pm  [phone] - After-hours or on Weekends/Holidays  (This is the [facility] Restaurant manager, fast food; ask to have the faculty anesthesiologist paged)

## 2022-07-01 NOTE — H&P
UPDATED H&P REQUIREMENT    For [facility] and [name]    WHAT IS THE STATUS OF THE PATIENT'S MOST CURRENT HISTORY AND PHYSICAL?   - The most current H&P was performed within the past 24 hours. No additional updated H&P documentation is necessary.     REFER TO MEDICAL STAFF POLICIES REGARDING PRE-PROCEDURE HISTORY AND PHYSICAL EXAMINATION AND UPDATED H&P REQUIREMENTS BELOW:    [facility] and [name] Medical Staff Policy 200 - For Patients Undergoing Procedures Requiring Moderate or Deep Sedation, General Anesthesia or Regional Anesthesia    Contents of a History and Physical Examination (H&P):    The H&P shall consist of chief complaint, history of present illness, allergies and medications, relevant social and family history, past medical history, review of systems and physical examination, and assessment and plan appropriate to the patient's age.    For Patients Undergoing Procedures Requiring Moderate or Deep Sedation, General Anesthesia or Regional Anesthesia:    1. An H&P shall be performed within 24 hours prior to the procedure by a qualified member of the medical staff or designee with appropriate privileges, except as noted in item 2 below.    2. If a complete history and physical was performed within thirty (30) calendar days prior to the patient's admission to the Medical Center for elective surgery, a member of the medical staff assumes the responsibility for the accuracy of the clinical information and will need to document in the medical record within twenty-four (24) hours of admission and prior to surgery or major invasive procedure, that they either attest that the history and physical has been reviewed and accepted, or document an update of the original history and physical relevant to the patient's current clinical status.    3. Providing an H&P for  patients undergoing surgery under local anesthesia is at the discretion of the Attending Physician.     4. When a procedure is performed by a dentist, podiatrist or other practitioner who is not privileged to perform an H&P, the anesthesiologist's assessment immediately prior to the procedure will constitute the 24 hour re-assessment.The dentist, podiatrist or other practitioner who is not privileged to perform an H&P will document the history and physical relevant to the procedure.    5. If the H&P and the written informed consent for the surgery or procedure are not recorded in the patient's medical record prior to surgery, the operation shall not be performed unless the attending physician states in writing that such a delay could lead to an adverse event or irreversible damage to the patient.    6. The above requirements shall not preclude the rendering of emergency medical or surgical care to a patient in dire circumstances.

## 2022-07-01 NOTE — Procedures
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 ERCP for management of bile leak.               PROCEDURE PERFORMED: ENDOSCOPIC RETROGRADE CHOLANGIOPANCREATOGRAPHY    MEDICATIONS:    General Anesthesia    PROCEDURE TECHNIQUE:  Consent:? After discussion of the potential benefits, risks (bleeding, perforation, pancreatitis and cholangitis), limitations, and alternatives, the patient consented to the procedure.   Preparation:? The patient was found to be fit for deep sedation.   ASA Classification: Class 3 - Patient has moderate systemic disturbance that may or may not be related to the disorder requiring surgery.   Procedure:? The duodenoscope was advanced with ease to the duodenum. The patient's toleration of the procedure was good. The views were good.    Total Withdrawl Time:   Total Insertion Time:                              EXTENT OF EXAM:  Second portion of the duodenum            INSTRUMENTS:     TECHNICAL DIFFICULTY:  No   LIMITATIONS:      None TOLERANCE:   Good  VISUALIZATION:  Good                                                         FINDINGS:   Luminal: The esophagus appeared to be normal. No evidence of esophagitis in the esophagus. The stomach had a partial resection with patent gastrojejunostomy, consistent with Billroth II anatomy. The jejunum and duodenum mucosa appeared normal with no   ulcers or erosions. The jejunum appeared normal. The efferent common limb was intubated >50 cm beyond the stomach and appeared normal. The pancreaticobiliary limb was then deeply intubated. The ampulla was identified and appeared to be at the edge of two   medium to large periampullary diverticulae. A plastic biliary stent was seen emanating from the ampulla.     Pancreaticobiliary: Scout film revealed clips and a plastic biliary stent in the right upper quadrant. Deep biliary cannulation was achieved using the ballon tip catheter over a guidewire adjacent to the biliary stent. Once biliary cannulation was   achieved, the balloon tip catheter was exchanged out over the guidewire. The stent was then removed using a snare. The balloon tip catheter was passed back over the guidewire into the bile duct. Balloon sweeps were performed with removal of biliary   sludge and small stones. Occlusion cholangiogram was obtained. The CBD was dilated measuring 10 mm in diameter with a ''S'' shaped CBD. No filling defects were seen. No bile leak was seen. The procedure was then completed. The pancreatic   duct/pancreaticojejunostomy was not entered.  ?  My supervision was provided during the fluoroscopy used during the procedure. The fluoroscopy images of the bile duct anatomy were obtained, reviewed, and interpreted in real time during the procedure.    ESTIMATED BLOOD  LOSS:   None     DIAGNOSIS:  1. No bile leak seen.   2. Removal of the previously placed plastic biliary stent.  3. Removal of biliary sludge and small stones from the CBD.  4. Anatomy consistent with Billroth 2.    RECOMMENDATIONS:  1. Watch for complications including bleeding, perforation, and pancreatitis.  2. No need for repeat ERCP.            This electronic signature authenticates all electronic and/or handwritten documentation, including orders, generated by the signer during the episode of care contained in this record.  07/01/2022 03:31:28 PM By Eloisa Northern

## 2022-07-01 NOTE — Op Note
1600'' No answer when I called [facility] at [phone]. I reached daughter to [name] at [phone]. [name] is unable to pick up mother tonight. Dr. Selena Batten notified. Will continue to call [facility] for pick up. Pt is calling [facility] at this moment to arrange pick up.Donna Gross

## 2022-07-01 NOTE — H&P
PRE-PROCEDURE H&P    Indication for Procedure:  Bile leak    Procedure: []  EGD   []  Colonoscopy  []  Enteroscopy  []  Flexible Sigmoidoscopy                         [x]  ERCP  []  EUS  []  Other:    Level of sedation intended for procedure:  []  Moderate  []  Deep  []  MAC  [x]  Anesthesia   Informed Consent Obtained Including Risks, Benefits & Alternatives:  [x]  Yes    Informed Consent Obtained For Sedation Including Risks, Benefits & Alternatives: [x]  Yes    History of Present Illness  Natalee Tomkiewicz is a 56 yo woman with billroth 2 anatomy with history of CBD stones and gallstones s/p cholecystectomy with post-operative bile leak s/p ERCP (04/22/22) with biliary stent placement. ERCP for management of bile leak.    Past Medical History  Past Medical History:   Diagnosis Date   ? Anxiety    ? Depression    ? Emphysema, unspecified (HCC/RAF)    ? Emphysema, unspecified (HCC/RAF)    ? Fibromyalgia    ? Gastritis    ? GERD (gastroesophageal reflux disease)    ? History of blood transfusion    ? Pancreatitis        Past Surgical History  Past Surgical History:   Procedure Laterality Date   ? ABDOMINAL SURGERY     ? COLON SURGERY         Medications  See medication list    Allergies  Hydrocodone, Ibuprofen, and Morphine    Family History  Family History   Problem Relation Age of Onset   ? Heart disease Mother    ? Diabetes Brother    ? Diabetes Father    ? Anesthesia problems Neg Hx    ? Malignant hypertension Neg Hx    ? Hypotension Neg Hx    ? Malignant hyperthermia Neg Hx    ? Pseudochol deficiency Neg Hx        Social History  Social History     Tobacco Use   ? Smoking status: Every Day     Years: 20.00     Types: Cigarettes   ? Smokeless tobacco: Current   ? Tobacco comments:     1 cig per day   Substance Use Topics   ? Alcohol use: No   ? Drug use: No       Review of Systems  A complete ROS was performed, pertinent positives and negative are in the HPI, remainder of 14 systems is negative    Physical Examination  Vital Signs: BP 135/77  ~ Pulse 60  ~ Temp 36.6 ?C (97.9 ?F) (Temporal)  ~ Resp 16  ~ Ht 1.626 m (5' 4'')  ~ Wt 56.5 kg (124 lb 9 oz)  ~ SpO2 97%  ~ BMI 21.38 kg/m?   Gen: No acute distress, answers questions appropriately.  HEENT: Anicteric sclera, oropharynx clear, moist mucous membranes  Neck: Trachea midline, good range of motion  Pulm: Clear to auscultation bilaterally, no wheezes, rales, or rhonchi  CV: Regular rate and rhythm, no murmurs, gallops or rubs  Abd: Normoactive bowel sounds, soft, nontender, nondistended, no rebound or guarding  Ext: No peripheral edema, clubbing, or cyanosis   Neuro: Alert and oriented x3, no focal neurologic deficits    Laboratory Data  Lab Results   Component Value Date    WBC 7.50 06/17/2022  HGB 11.8 06/17/2022    HCT 38.2 06/17/2022    MCV 86.4 06/17/2022    PLT 461 (H) 06/17/2022       Lab Results   Component Value Date    CREAT 1.14 06/17/2022    BUN 16 06/17/2022    NA 134 (L) 06/17/2022    K 5.4 (H) 06/17/2022    CL 99 06/17/2022    CO2 18 (L) 06/17/2022       Lab Results   Component Value Date    ALT 7 (L) 06/17/2022    AST 22 06/17/2022    ALKPHOS 87 06/17/2022    BILITOT 0.3 06/17/2022        Impression:  1. Bile leak    Recommendations  1.  Proceed with procedure.      ASA Classification: ASA 3 - Patient with moderate systemic disease with functional limitations    I have reviewed the history and physical and have determined Alethia Berthold to be an appropriate candidate to undergo the planned procedure with sedation and analgesia.    Eloisa Northern   07/01/2022 2:02 PM

## 2022-07-02 LAB — Tissue Exam

## 2022-07-23 ENCOUNTER — Inpatient Hospital Stay
Admit: 2022-07-23 | Discharge: 2022-07-23 | Disposition: A | Discharge status: Home / Self Care | Payer: PRIVATE HEALTH INSURANCE | Source: Home / Self Care

## 2022-07-23 ENCOUNTER — Ambulatory Visit: Payer: PRIVATE HEALTH INSURANCE

## 2022-07-23 DIAGNOSIS — R1032 Left lower quadrant pain: Secondary | ICD-10-CM

## 2022-07-23 DIAGNOSIS — R1031 Right lower quadrant pain: Secondary | ICD-10-CM

## 2022-07-23 LAB — Lipase: LIPASE: 47 U/L (ref 13–69)

## 2022-07-23 LAB — UA,Microscopic: RBCS HPF: 1 {cells}/[HPF] (ref 0–?)

## 2022-07-23 LAB — Prothrombin Time Panel: PROTHROMBIN TIME: 12.1 s (ref 11.5–14.4)

## 2022-07-23 LAB — Hepatic Funct Panel: TOTAL PROTEIN: 8.1 g/dL (ref 6.1–8.2)

## 2022-07-23 LAB — Basic Metabolic Panel: UREA NITROGEN: 13 mg/dL (ref 7–22)

## 2022-07-23 LAB — Amylase: AMYLASE: 88 U/L (ref 31–124)

## 2022-07-23 LAB — CBC: ABSOLUTE NUCLEATED RBC COUNT: 0 10*3/uL (ref 0.00–0.00)

## 2022-07-23 LAB — UA,Dipstick

## 2022-07-23 LAB — Extra Light Green Top

## 2022-07-23 LAB — Differential Automated: ABSOLUTE MONO COUNT: 0.55 10*3/uL (ref 0.20–0.80)

## 2022-07-23 MED ORDER — OXYCODONE-ACETAMINOPHEN 5-325 MG PO TABS
1 | ORAL_TABLET | Freq: Four times a day (QID) | ORAL | 0 refills | Status: AC | PRN
Start: 2022-07-23 — End: ?

## 2022-07-23 MED ORDER — AMOXICILLIN-POT CLAVULANATE 875-125 MG PO TABS
1 | ORAL_TABLET | Freq: Two times a day (BID) | ORAL | 0 refills | Status: AC
Start: 2022-07-23 — End: ?

## 2022-07-23 MED ADMIN — HYDROMORPHONE HCL 1 MG/ML IJ SOLN: .5 mg | INTRAVENOUS | @ 15:00:00 | Stop: 2022-07-23 | NDC 00409128331

## 2022-07-23 MED ADMIN — AMOXICILLIN-POT CLAVULANATE 875-125 MG PO TABS: 1 | ORAL | @ 19:00:00 | Stop: 2022-07-23 | NDC 66685100100

## 2022-07-23 MED ADMIN — IOHEXOL 350 MG/ML IV SOLN: 100 mL | INTRAVENOUS | @ 16:00:00 | Stop: 2022-07-23 | NDC 00407141491

## 2022-07-23 MED ADMIN — SODIUM CHLORIDE 0.9 % IV BOLUS: 1000 mL | INTRAVENOUS | @ 15:00:00 | Stop: 2022-07-23 | NDC 00338004904

## 2022-07-23 MED ADMIN — HYDROMORPHONE HCL 1 MG/ML IJ SOLN: .5 mg | INTRAVENOUS | @ 17:00:00 | Stop: 2022-07-23 | NDC 00409128331

## 2022-07-23 MED ADMIN — ONDANSETRON HCL 4 MG/2ML IJ SOLN: 4 mg | INTRAVENOUS | @ 15:00:00 | Stop: 2022-07-23 | NDC 60505613000

## 2022-07-23 MED ADMIN — HYDROMORPHONE HCL 1 MG/ML IJ SOLN: .5 mg | INTRAVENOUS | @ 19:00:00 | Stop: 2022-07-23 | NDC 00409128331

## 2022-07-23 NOTE — ED Notes
Report given to Christine RN.

## 2022-07-23 NOTE — ED Notes
[facility]?NOTIFICATION?07/23/2022 06:15?Donna Gross?MRN: [id number]    [name]:   AVW:?[id number]  Account [id number]  Billing Account [id number]      Criteria Met      6 Visits in 180 Days    History of Sepsis Dx    Security and Safety  No Security Events were found.  ED Care Guidelines  There are currently no ED Care Guidelines for this patient. Please check your facility's medical records system.    Flags      History of Sepsis - Patient has received a diagnosis of Sepsis from an acute or post-acute setting. Apply appropriate clinical planning practices; to learn more visit [email or web address] / Attributed By: [employer] / Attributed On: 04/09/2022         Prescription Drug Data  No Prescription Drug Data was found.    E.D. Visit Count (12 mo.)  Facility Visits   [name] Donna Gross. Gross 4   [facility] 3   [facility] 4   Total 11   Note: Visits indicate total known visits.     Recent Emergency Department Visit Summary  Showing 10 most recent visits out of 11 in the past 12 months   Date Facility [facility] Type Diagnoses or Chief Complaint    Jul 23, 2022  [facility] [place]     May 19, 2022  [facility]  [place]  [place]  Emer[place]      Other long term (current) drug therapy      Acquired absence of other specified parts of digestive tract      Unspecified abdominal pain      Chronic obstructive pulmonary disease with (acute) exacerbation      May 18, 2022  [place] [facility] [place].  [place]      1. Unspecified abdominal pain      1. Abdominal Pain      Apr 01, 2022  [facility]  [place].  [place]  [place]  Chief Complaint: ABDOMINAL PAIN    Feb 13, 2022  [facility] [place]  [place]  [place]      1. Calculus of gallbladder without cholecystitis without obstruction      1. Abdominal Pain      Dec 19, 2021  [facility].  [place]      1. Unspecified abdominal pain      1. Abdominal Pain      Dec 16, 2021  [place]  Chief Complaint: Donna Gross AND ABD PAIN    Nov 08, 2021  [place]  Chief Complaint: CHEST PAIN    Nov 02, 2021  [facility] [place] [facility]  [place]  Chief Complaint: CHEST PAIN, AB PAIN ,BACK PAIN, VOMITING    Aug 07, 2021  [place]  Chief Complaint: CHEST PAIN/ AB PAIN/ VOMITING      Recent Inpatient Visit Summary  Date Facility [facility] Type Diagnoses or Chief Complaint    Apr 25, 2022  [place] [facility]  [place]      1. Nausea with vomiting, unspecified      1. Unspecified abdominal pain  1. Unspecified complication of procedure, initial encounter      2. Disease of biliary tract, unspecified      3. Peritoneal abscess      4. Other bacterial infections of unspecified site      4. Abdominal Pain      5. Extended spectrum beta lactamase (ESBL) resistance      7. Epigastric pain      8. Other disorders of electrolyte and fluid balance, not elsewhere classified      Apr 01, 2022  [facility]  [place]  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  [place]      1. Calculus of gallbladder without cholecystitis without obstruction      2. Pruritus, unspecified      Nov 08, 2021  [place]  Medical [place]  Chief Complaint: ACUTE PANCREATITIS    Aug 07, 2021  [place] [facility]  [place]  Medical [place]  Chief Complaint: CHEST PAIN/ AB PAIN/ VOMITING      Care Team  Donna Gross Specialty Phone Fax Service Dates   [facility] Clinic/Center: [facility] [phone] Current    Sela Hilding, M.D. Family Medicine   Current      [facility]  This patient has registered at the [facility] Emergency Department   For more information visit: [email or web address]   PLEASE NOTE:     1.   Any care recommendations and other clinical information are provided as guidelines or for historical purposes only, and providers should exercise their own clinical judgment when providing care.    2.   You may only use this information for purposes of treatment, payment or health care operations activities, and subject to the limitations of applicable [facility] Policies.    3.   You should consult directly with the organization that provided a care guideline or other clinical history with any questions about additional information or accuracy or completeness of information provided.    ? 2023 [facility] - [email or web address]

## 2022-07-23 NOTE — ED Provider Notes
[facility]  Emergency Department Service Report    Donna Gross 55 y.o. female , presents with Abdominal Pain      Triage   Arrived on 07/23/2022 at 6:15 AM   Arrived by Walk-in [14]    ED Triage Vitals   Temp Temp Source BP Heart Rate Resp SpO2 O2 Device Pain Score Weight   07/23/22 0617 07/23/22 0617 07/23/22 0617 07/23/22 0617 07/23/22 0617 07/23/22 0617 07/23/22 0617 07/23/22 0629 07/23/22 0629   36.5 ?C (97.7 ?F) Oral 121/77 72 17 96 % None (Room air) Ten 52.2 kg (115 lb)       Pre hospital care:       Allergies   Allergen Reactions   ? Hydrocodone Other (See Comments)     ''burns a hole in stomach''  Tolerates hydromorphone   ? Ibuprofen Other (See Comments)     GI discomfort    ''hurts my stomach''   ? Morphine Itching     Tolerates hydromorphone       History   Patient is a 56 y.o. female with hx of PUD c/b GOO s/p multiple surgeries including vagotomy, hernia repair, colon perforation s/p colostomy and reversal, gallstone pancreatitis s/p lap cholecystectomy (03/27/2022) c/b fluid collection s/p biliary drain, ERCP and biliary stent placement (04/22/2022) and now s/p biliary stent removal (07/01/2022), anxiety/depression, emphysema, fibromyalgia who presents to the ED with complaint of abdominal pain starting 4 months ago s/p cholecystectomy. Patient states she's been having diffuse abdominal pain and diarrhea since the cholecystectomy. Sx are severe, frequent, non-radiating. Endorses associated nausea. No fevers. Allergy to motrin and morphine. No alcohol or drug use. She reports smoking 0.1 ppd.     The history is provided by the patient. No language interpreter was used.          Chiropractor Used?: No                   Past Medical History:   Diagnosis Date   ? Anxiety    ? Depression    ? Emphysema, unspecified (HCC/RAF)    ? Emphysema, unspecified (HCC/RAF)    ? Fibromyalgia    ? Gastritis    ? GERD (gastroesophageal reflux disease)    ? History of blood transfusion    ? Pancreatitis         Past Surgical History:   Procedure Laterality Date   ? ABDOMINAL SURGERY     ? COLON SURGERY          Past Family History   family history includes Diabetes in her brother and father; Heart disease in her mother.                 Past Social History   she reports that she has been smoking cigarettes. She uses smokeless tobacco. She reports that she does not drink alcohol and does not use drugs. No history on file for sexual activity.       Physical Exam   Physical Exam  Vitals and nursing note reviewed.   Constitutional:       Appearance: She is well-developed.   HENT:      Head: Normocephalic and atraumatic.   Eyes:      Conjunctiva/sclera: Conjunctivae normal.      Pupils: Pupils are equal, round, and reactive to light.   Cardiovascular:      Rate and Rhythm: Normal rate and regular rhythm.      Heart  sounds: Normal heart sounds.   Pulmonary:      Effort: Pulmonary effort is normal.      Breath sounds: Normal breath sounds.   Abdominal:      General: Bowel sounds are normal.      Palpations: Abdomen is soft.      Comments: Post surgical abdomen with multiple abdominal scars, non-tender.   Musculoskeletal:         General: Normal range of motion.      Cervical back: Normal range of motion and neck supple.   Skin:     General: Skin is warm and dry.   Neurological:      Mental Status: She is alert and oriented to person, place, and time.      Deep Tendon Reflexes: Reflexes are normal and symmetric.   Psychiatric:         Behavior: Behavior normal.         Judgment: Judgment normal.         ED Course          Laboratory Results   Labs Reviewed - No data to display    Imaging Results     No orders to display       Administered Medications     Medication Administration from 07/23/2022 0615 to 07/23/2022 4540     None          Procedures   Procedural Sedation  Procedures    Medical Decision Making     MDM   ED Course:  Nursing note reviewed.  Previous medical records were obtained and reviewed by myself, including discharge summary from 5/18. Patient is s/p lap cholecystectomy on 03/27/22 c/b fluid collection s/p biliary drain , ERCP and biliary stent placement 5/3. She is s/p biliary stent removal 7/12.    0707: I visited the patient to obtain history and perform the physical exam.   IV access was secured.   Orders placed for CT abd pelvis, labs.   Patient will be administered IV Zofran, IV bolus and dilaudid.      Laboratory Results (as interpreted by me):  No significant electrolyte abnormality or renal dysfunction.  LFTs grossly WNL  Lipase, Amylase WNL  No leukocytosis or anemia.      Radiology Results (as interpreted by me):   ***    Medical Decision Making:  Donna Gross presents to the ED today with chief complaint of abdominal pain.  On exam, the patient exhibits post surgical abdomen with multiple abdominal scars. Abdomen is non-tender.    Smoking Cessation  I spent >3 minutes discussing with the patient regarding smoking cessation.  I strongly recommended to the patient to stop smoking.  It is a very important thing the patient can do to improve their cardiovascular and pulmonary  health.  Patient states they are willing to attempt to quit and will try the following strategy:  These are the strategies (plans) that are available to assist you to stop smoking or using other forms of tobacco or nicotine: Nicotine gum, nicotine inhaler or ask primary care doctor.  Please see attached carenote for further detail      ***FOR DISCHARGE***Given the patient's physical exam, laboratory analysis, and imaging studies, my impression is the patient is experiencing an episode of ***.  Therefore, I feel comfortable discharging the patient home with close outpatient follow up.    Discharge Instructions:  I advised the patient to follow up with their PCP*** on an outpatient basis over the next  24-48 hours, and recommended prompt  return to the emergency department if they have additional concerns or note worsening symptoms.  The patient indicates understanding of these issues and agrees with the plan.  A copy of the ED  workup results was provided to the patient as well.    ***FOR ADMIT***Given the patient's physical exam, laboratory analysis, and imaging studies, my impression is the patient is experiencing an episode of ***. Given the above findings and discussion, I strongly believe the patient will greatly benefit from an inpatient admission for further evaluation and treatment.    Clinical Impression   No diagnosis found.      Prescriptions     New Prescriptions    No medications on file       Disposition and Follow-up   Disposition: Refresh note to pull in Disposition    No future appointments.    Follow up with:  No follow-up provider specified.    Return precautions are specified on After Visit Summary.    Scribe Signature   I, Marijean Bravo, have acted as a Stage manager for patient Donna Gross on behalf of Cyndie Mull., MD at 07/23/2022 at 6:20 AM. All documentation underwent a comprehensive review by the listed physician(s) and received their approval upon signing.

## 2022-07-30 MED ORDER — ACETAMINOPHEN EXTRA STRENGTH 500 MG PO TABS
ORAL_TABLET | 0 refills
Start: 2022-07-30 — End: ?

## 2022-11-19 IMAGING — DX XR FLAT AND UPRIGHT ABDOMEN
1 series · 3 of 3 positions shown · non-contrast
Comparison: None.

FINAL REPORT:
XR FLAT AND UPRIGHT ABDOMEN
INDICATION: Pain.
TECHNIQUE: 3 views of the abdomen.

[Series 6126: AP · U · 0.19mm/px · 3 of 3 slices shown]
[im 1/3]
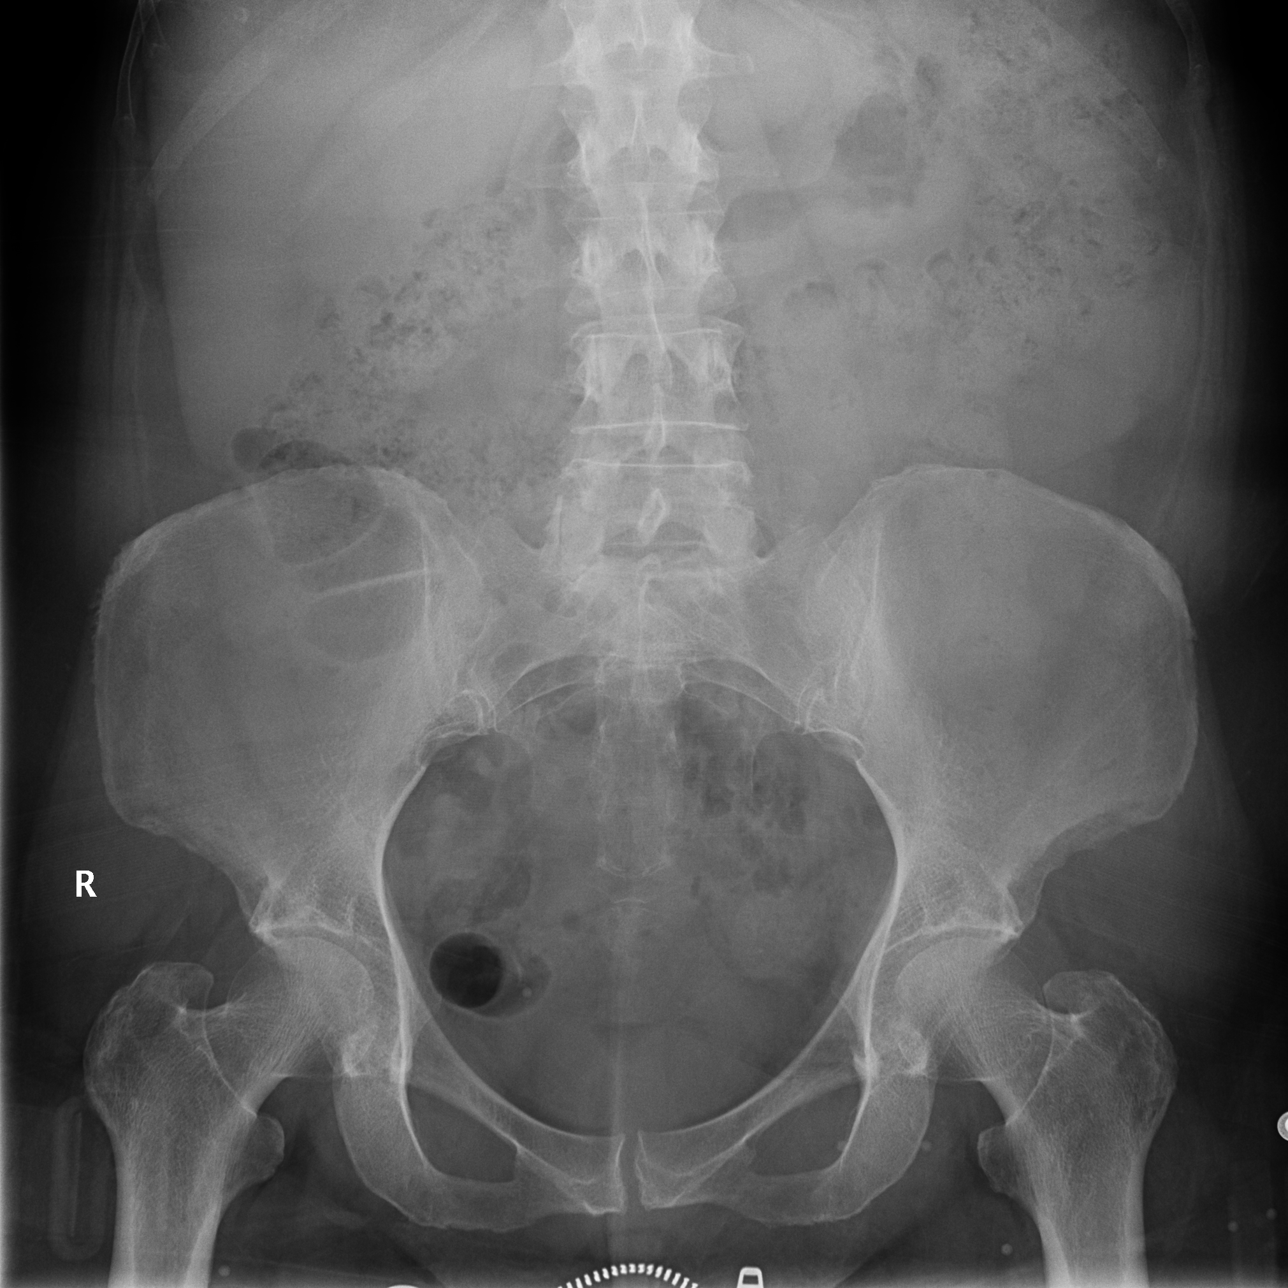
[im 2/3]
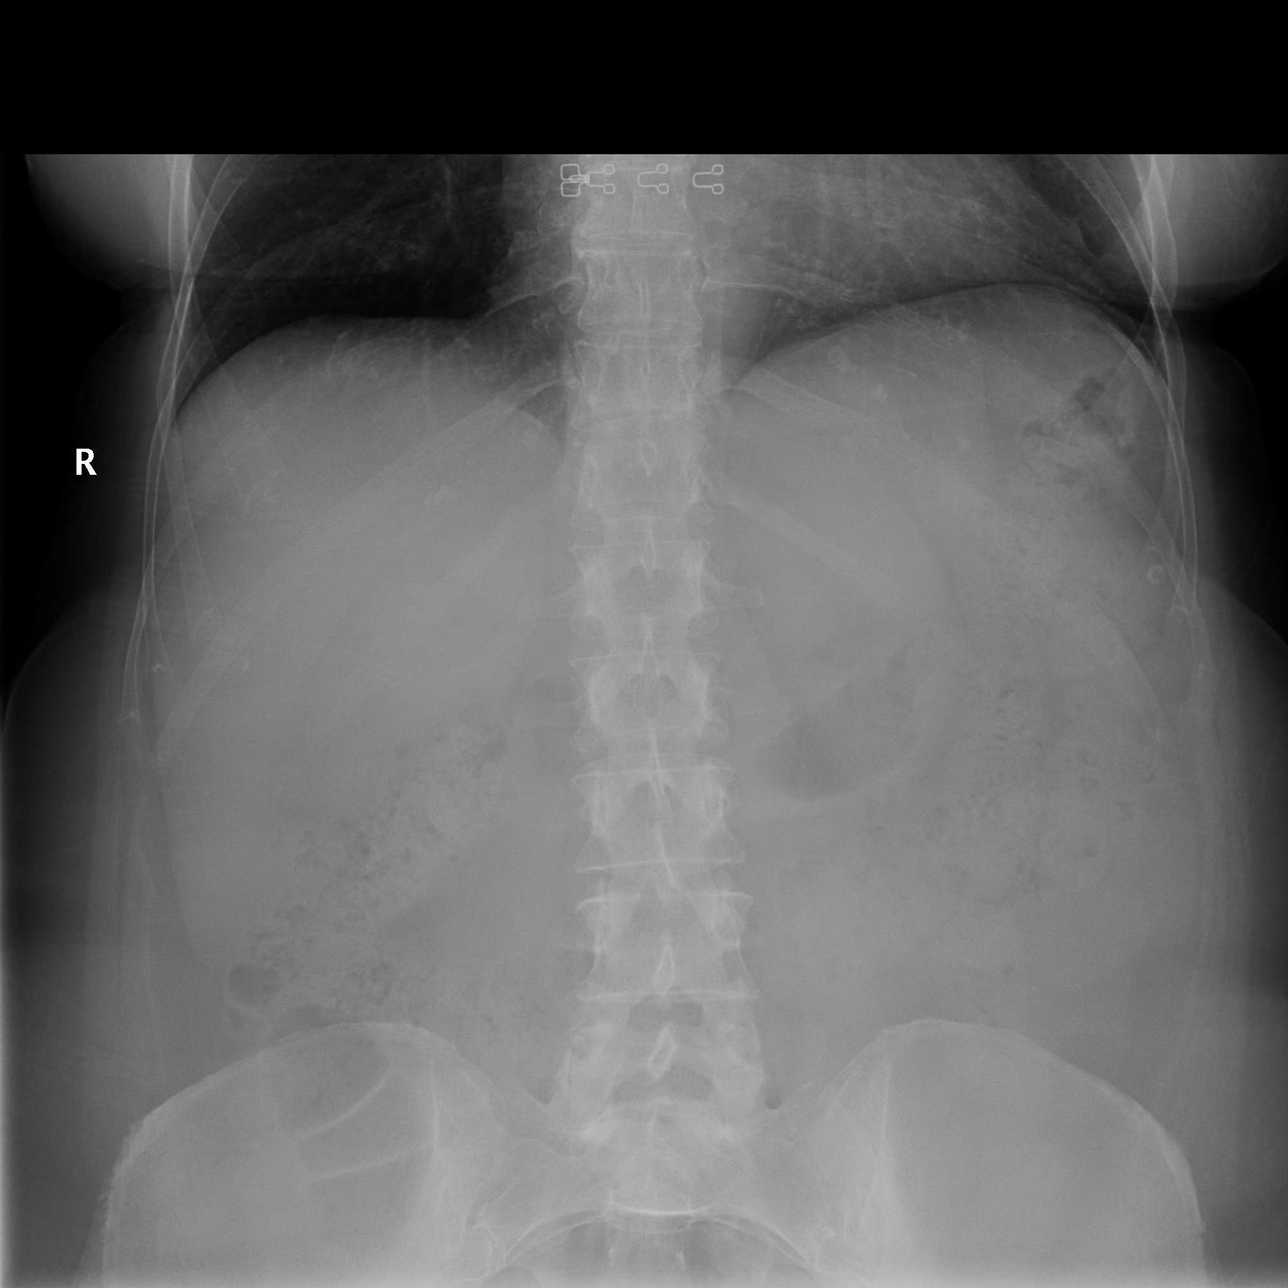
[im 3/3]
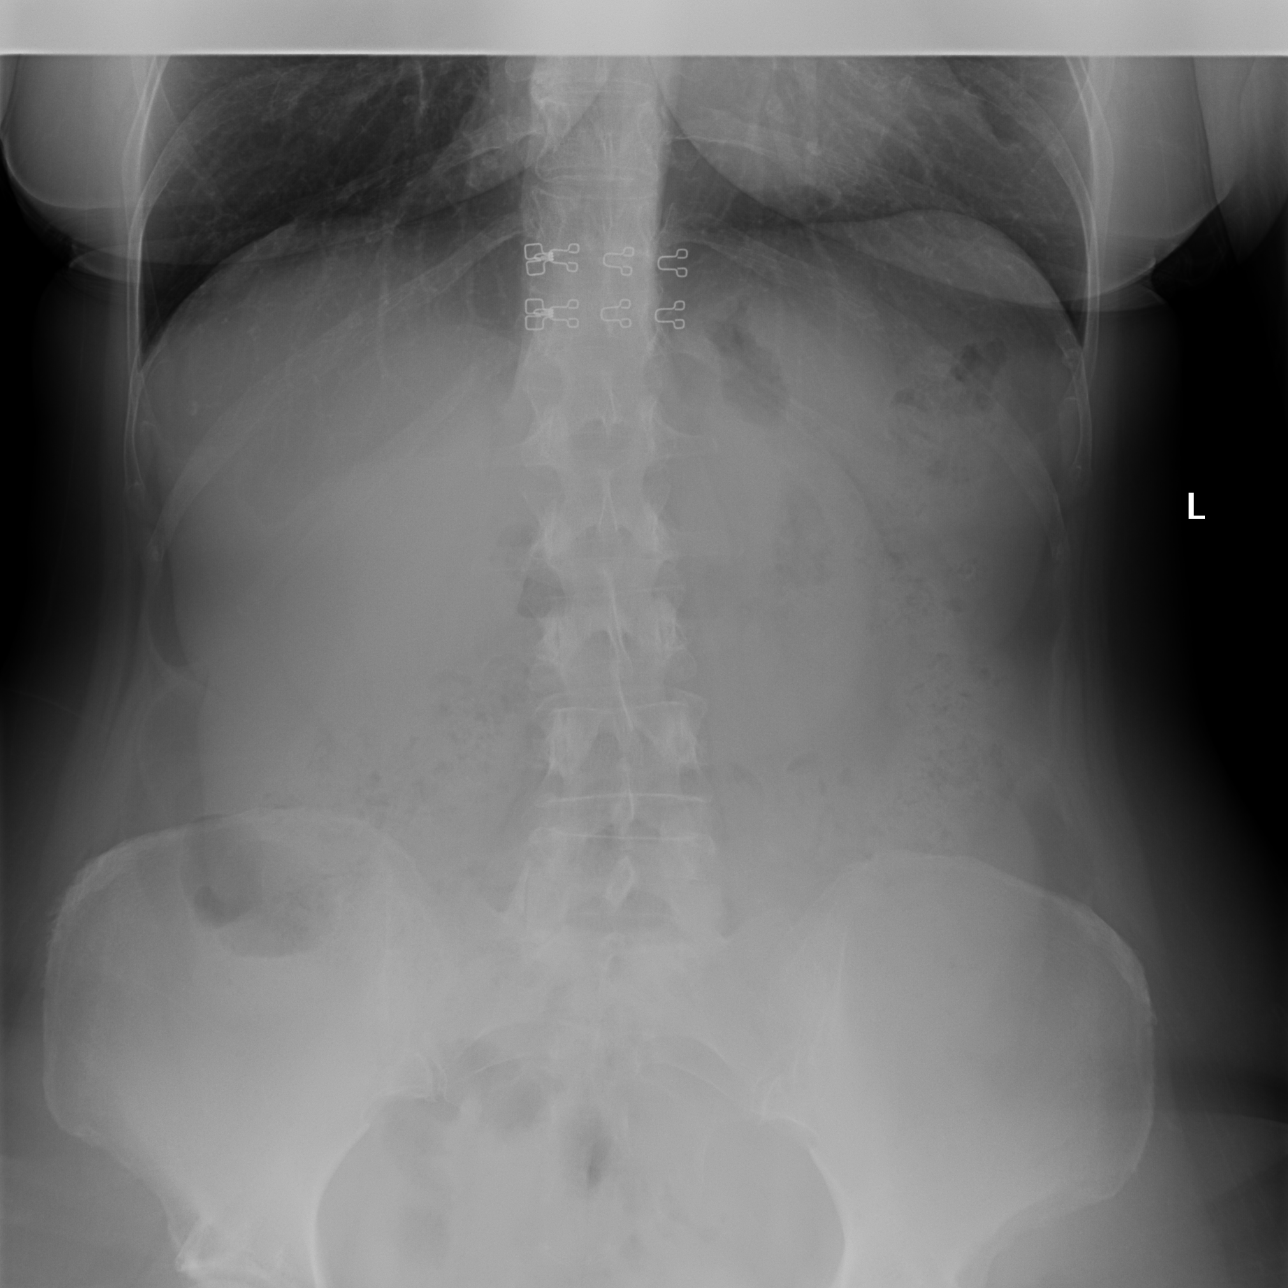

[3 of 3 positions shown; findings below may reference images not displayed]

FINDINGS: No free air or intrahepatic air. No dilated loops of bowel. Moderate stool burden in the colon. No suspicious masses or calcifications. No acute osseous findings.
IMPRESSION: 
IMPRESSION: Moderate stool burden in the colon, correlate for clinical symptoms of constipation.
Is the patient pregnant?
No

## 2022-11-23 IMAGING — CT CT ABDOMEN PELVIS WITHOUT CONTRAST
2 of 3 series · 13 of 32 positions shown, 19 images · non-contrast
Comparison: None available.

Suspect kidney stone
FINAL REPORT:
INDICATION: Pelvic and perineal pain
Personal history of urinary calculi
Unspecified renal colic
EXAM: CT of the abdomen and pelvis without IV Contrast
Submitted images:
All CT scans at this facility use iterative reconstruction technique, dose modulation and/or weight based dosing when appropriate to reduce radiation dose to as low as reasonably achievable.

[Series 2: abd/pel without · axial · non-contrast · 0.81mm/px · z∈[-390,-70]mm · 9 of 160 slices shown, 15 images]
[im 16/160  soft-tissue]
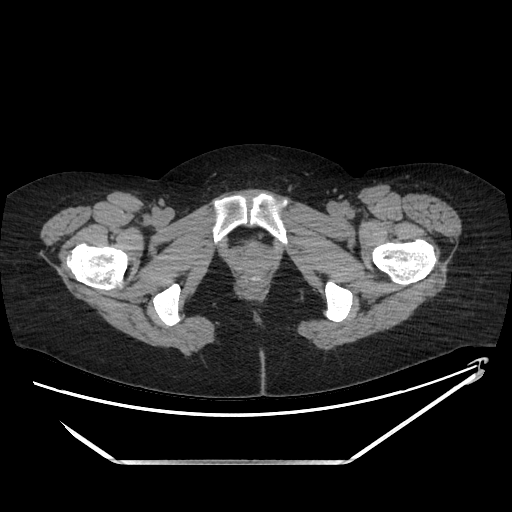
[im 16/160  bone]
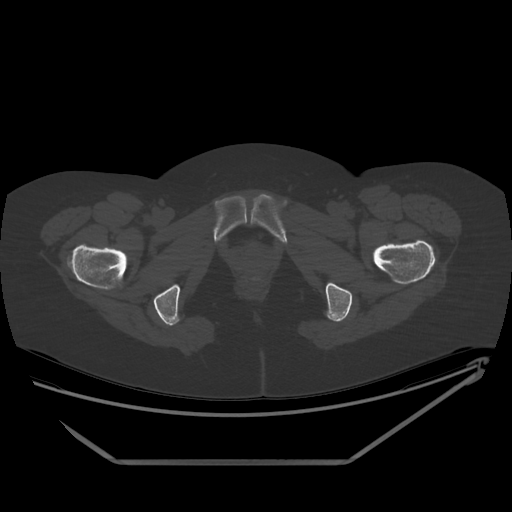
[im 32/160  soft-tissue]
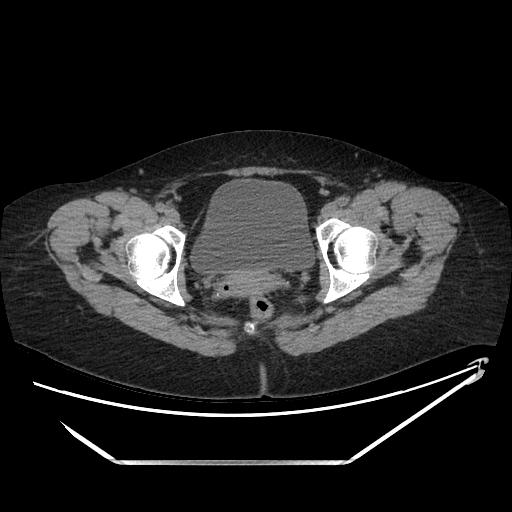
[im 48/160  soft-tissue]
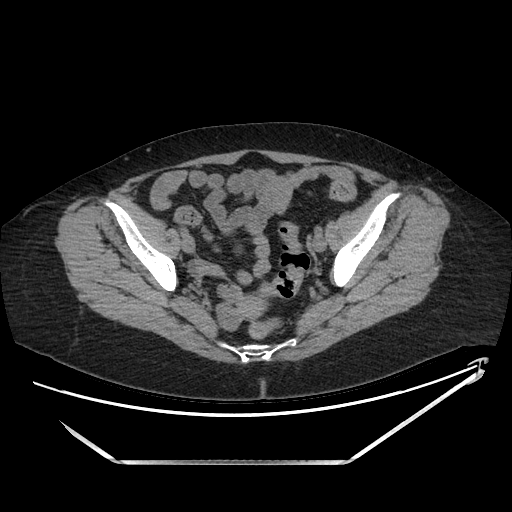
[im 64/160  soft-tissue]
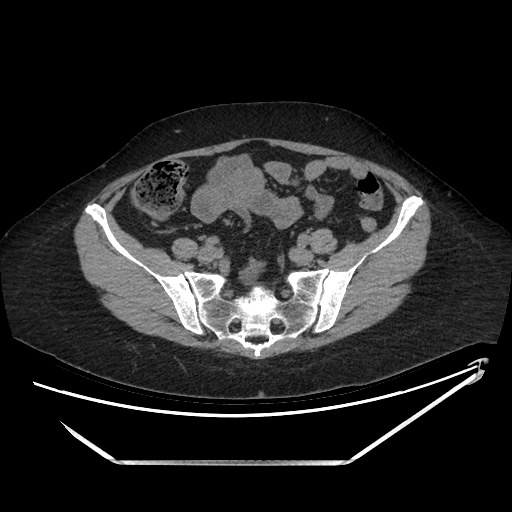
[im 80/160  soft-tissue]
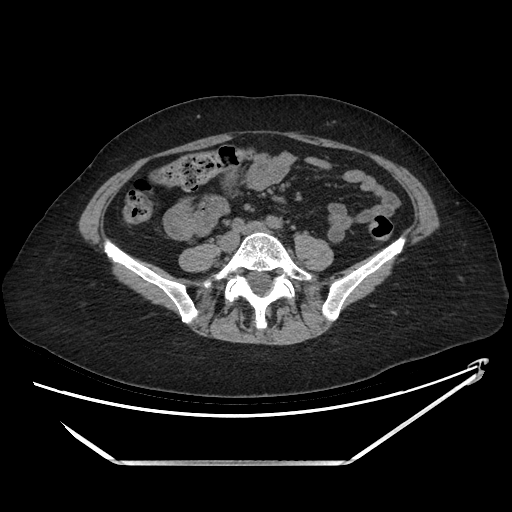
[im 96/160  soft-tissue]
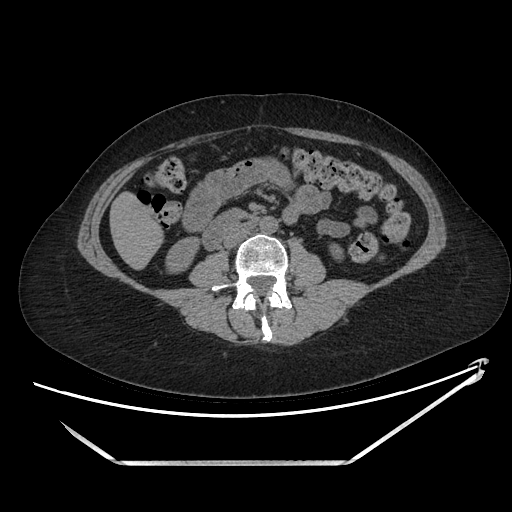
[im 96/160  lung]
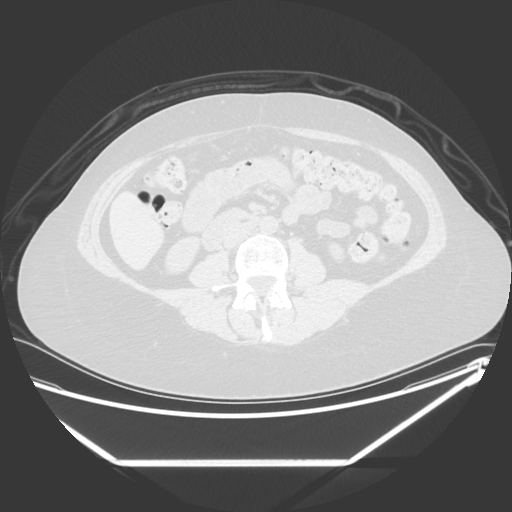
[im 112/160  soft-tissue]
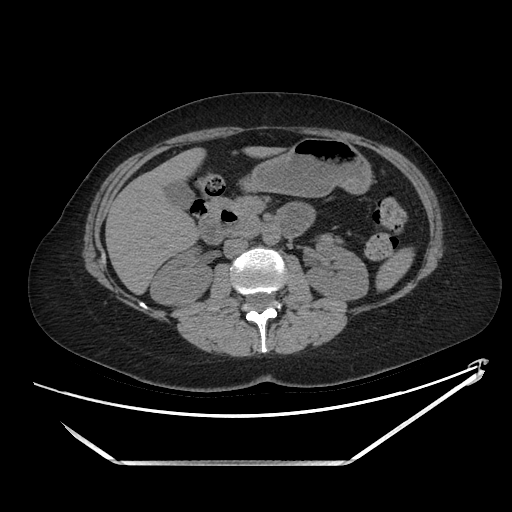
[im 112/160  lung]
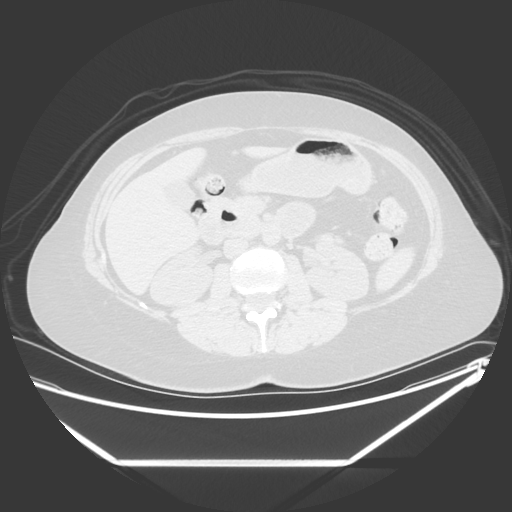
[im 128/160  soft-tissue]
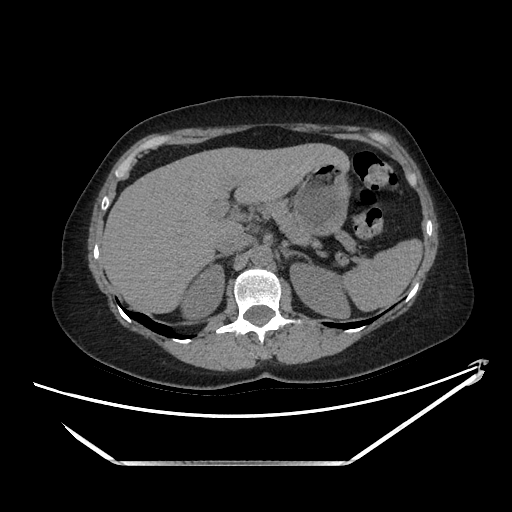
[im 128/160  lung]
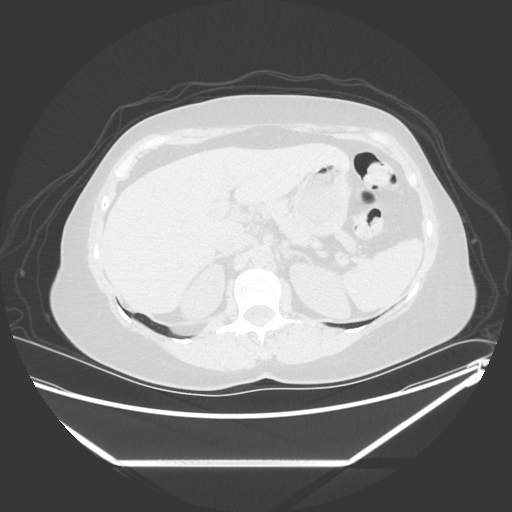
[im 144/160  soft-tissue]
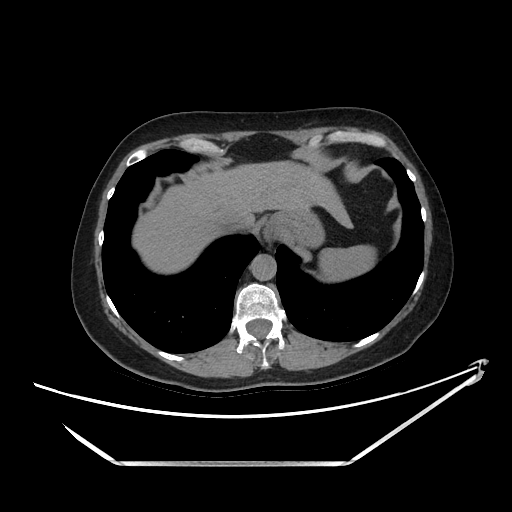
[im 144/160  lung]
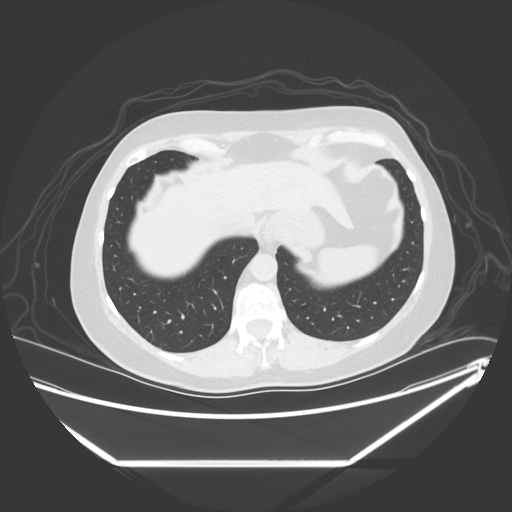
[im 144/160  bone]
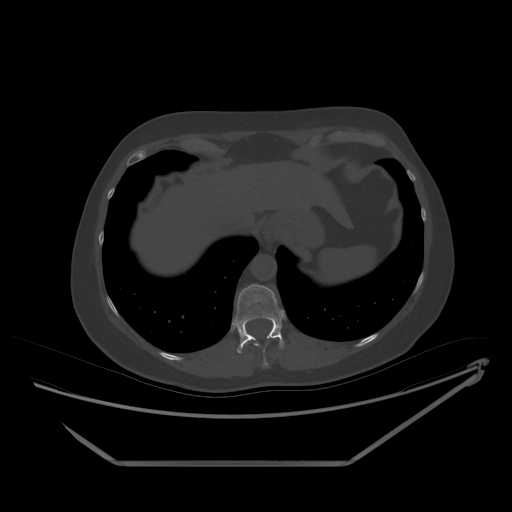

[Series 602: sagittal · sagittal · 0.81mm/px · 4 of 208 slices shown]
[im 18/208  soft-tissue]
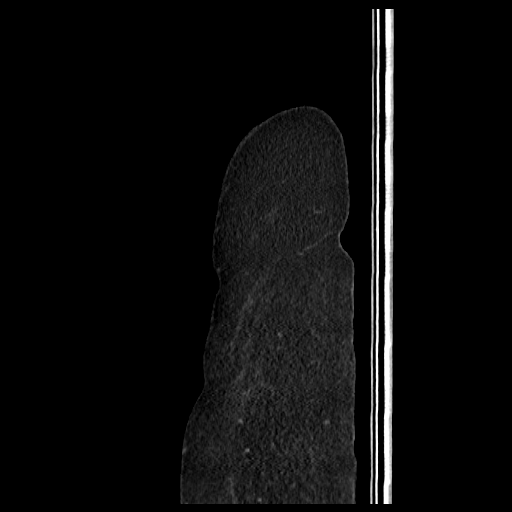
[im 52/208  soft-tissue]
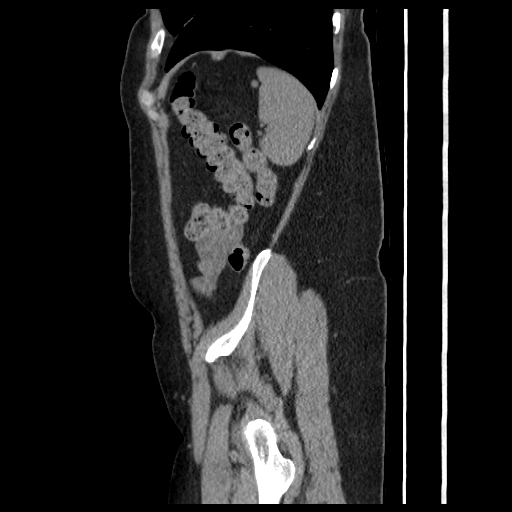
[im 70/208  soft-tissue]
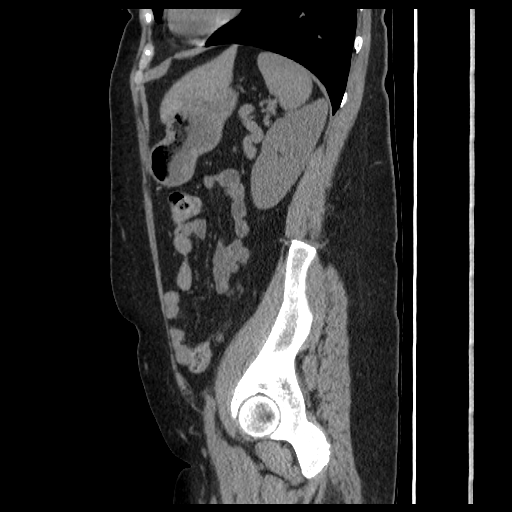
[im 87/208  soft-tissue]
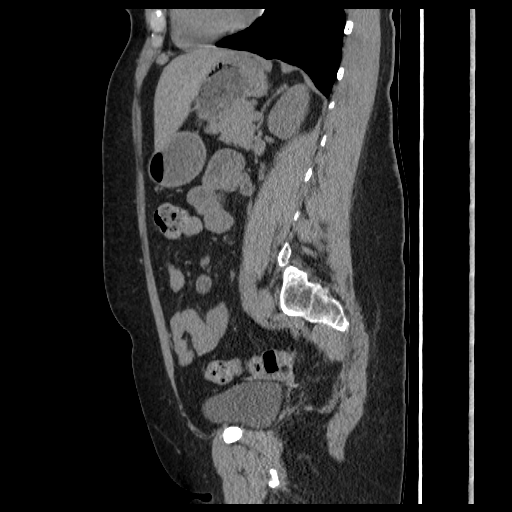

[13 of 32 positions shown; findings below may reference images not displayed]

FINDINGS: Lung bases: Unremarkable.
Gallbladder and biliary tree: Unremarkable.
Liver: Unremarkable.
Adrenal glands: Unremarkable.
Kidneys and ureters: Unremarkable.
Pancreas: Unremarkable.
Spleen: Unremarkable.
Vessels: The abdominal aorta is nonaneurysmal.
Peritoneum: No pneumoperitoneum or free intraperitoneal fluid collections.
Bowel: Unremarkable.
Bladder: Unremarkable.
Reproductive organs: Unremarkable.
Lymph nodes
Retroperitoneal: Unremarkable.
Mesenteric: Unremarkable.
Pelvic: Unremarkable.
Abdominal wall: Unremarkable.
Bones: Unremarkable.
IMPRESSION: 
IMPRESSION: 1. No significant abnormality
Is the patient pregnant?
No

## 2022-11-24 ENCOUNTER — Inpatient Hospital Stay
Admit: 2022-11-24 | Discharge: 2022-11-24 | Disposition: A | Discharge status: Home / Self Care | Payer: PRIVATE HEALTH INSURANCE | Source: Home / Self Care | Attending: Student in an Organized Health Care Education/Training Program

## 2022-11-24 ENCOUNTER — Ambulatory Visit: Payer: PRIVATE HEALTH INSURANCE

## 2022-11-24 DIAGNOSIS — R109 Unspecified abdominal pain: Secondary | ICD-10-CM

## 2022-11-24 LAB — Basic Metabolic Panel: UREA NITROGEN: 12 mg/dL (ref 7–22)

## 2022-11-24 LAB — Prothrombin Time Panel: INR: 1 s (ref 11.5–14.4)

## 2022-11-24 LAB — Differential Automated: ABSOLUTE LYMPHOCYTE COUNT: 2.08 10*3/uL (ref 1.30–3.40)

## 2022-11-24 LAB — Hepatic Funct Panel: ASPARTATE AMINOTRANSFERASE: 18 U/L (ref 13–62)

## 2022-11-24 LAB — Lipase: LIPASE: 17 U/L (ref 13–69)

## 2022-11-24 LAB — CBC: NUCLEATED RBC%, AUTOMATED: 0 (ref 3.96–5.09)

## 2022-11-24 MED ORDER — ONDANSETRON HCL 4 MG PO TABS
4 mg | ORAL_TABLET | Freq: Four times a day (QID) | ORAL | 0 refills | Status: AC | PRN
Start: 2022-11-24 — End: ?

## 2022-11-24 MED ADMIN — DIPHENHYDRAMINE HCL 50 MG/ML IJ SOLN: 25 mg | INTRAVENOUS | @ 15:00:00 | Stop: 2022-11-24 | NDC 00641037621

## 2022-11-24 MED ADMIN — METOCLOPRAMIDE HCL 5 MG/ML IJ SOLN: 10 mg | INTRAVENOUS | @ 15:00:00 | Stop: 2022-11-24 | NDC 00409341401

## 2022-11-24 MED ADMIN — SODIUM CHLORIDE 0.9 % IV BOLUS: 1000 mL | INTRAVENOUS | @ 15:00:00 | Stop: 2022-11-24 | NDC 00338004904

## 2022-11-24 MED ADMIN — HYDROMORPHONE HCL 1 MG/ML IJ SOLN: .5 mg | INTRAVENOUS | @ 18:00:00 | Stop: 2022-11-24 | NDC 00409128331

## 2022-11-24 MED ADMIN — ACETAMINOPHEN 500 MG PO TABS: 1000 mg | ORAL | @ 16:00:00 | Stop: 2022-11-24

## 2022-11-24 MED ADMIN — KETOROLAC TROMETHAMINE 30 MG/ML IJ SOLN: 15 mg | INTRAVENOUS | @ 18:00:00 | Stop: 2022-11-24 | NDC 00338007225

## 2022-11-24 MED ADMIN — ONDANSETRON HCL 4 MG/2ML IJ SOLN: 4 mg | INTRAVENOUS | @ 15:00:00 | Stop: 2022-11-24

## 2022-11-24 MED ADMIN — HYDROMORPHONE HCL 1 MG/ML IJ SOLN: .5 mg | INTRAVENOUS | @ 17:00:00 | Stop: 2022-11-24 | NDC 00409128331

## 2022-11-24 MED ADMIN — HYDROMORPHONE HCL 1 MG/ML IJ SOLN: .5 mg | INTRAVENOUS | @ 19:00:00 | Stop: 2022-11-24 | NDC 00409128331

## 2022-11-24 MED ADMIN — IOHEXOL 350 MG/ML IV SOLN: 100 mL | INTRAVENOUS | @ 16:00:00 | Stop: 2022-11-24 | NDC 00407141491

## 2022-11-24 NOTE — ED Notes
Patient was given detailed verbal and written discharge instructions concerning their stay in the emergency room. Patient was encouraged to read the hand-outs that were given to them and to refer to them for information concerning their diagnosis and treatment. Patient educated on prescriptions provided in ED and instructed to take all medications as prescribed. Patient is aware that she will need a ride home since she was given narcotics during her ED visit she can't operate a vehicle after, patient understands and will have someone pick up patient. Patient verbalizes understanding and is agreeable to discharge with no further questions. Patient instructed to return to the emergency room for any new or worsening symptoms. Patient departed ED in no apparent distress.

## 2022-11-24 NOTE — ED Notes
[facility]?NOTIFICATION?11/24/2022 05:55?Donna Gross?MRN: [id number]    Donna Gross:   YNW:?[id number]  Account [id number]  Billing Account [id number]      Criteria Met      History of Sepsis Dx    Security and Safety  No Security Events were found.  ED Care Guidelines  There are currently no ED Care Guidelines for this patient. Please check your facility's medical records system.    Flags      History of Sepsis - Patient has received a diagnosis of Sepsis from an acute or post-acute setting. Apply appropriate clinical planning practices; to learn more visit [email or web address] / Attributed By: [employer] / Attributed On: 04/09/2022         Prescription Drug Data  No Prescription Drug Data was found.    E.D. Visit Count (12 mo.)  Facility Visits   Donna [facility] 2   Donna [facility] 2   Donna Gross [facility] 5   Total 9   Note: Visits indicate total known visits.     Recent Emergency Department Visit Summary  Date Facility Donna Gross Type Diagnoses or Chief Complaint    Nov 24, 2022  [facility]  [place]     Jul 25, 2022  [place]  Chief Complaint: ABD PAIN    Jul 23, 2022  [place] [facility]  [place]gency      1. Right lower quadrant pain      1. Abdominal Pain      2. Left lower quadrant pain      May 19, 2022  [facility]  [place]  [place]  Emergency      Other long term (current) drug therapy      Acquired absence of other specified parts of Donna tract      Unspecified abdominal pain      Chronic obstructive pulmonary disease with (acute) exacerbation      May 18, 2022  [facility]  [place]  Emergency      1. Unspecified abdominal pain      1. Abdominal Pain      Apr 01, 2022  [facility]  [place].  [place]  [place]  Chief Complaint: ABDOMINAL PAIN    Feb 13, 2022  [facility]  [place]  [place]gency      1. Calculus of gallbladder without cholecystitis without obstruction      1. Abdominal Pain      Dec 19, 2021  [place]      1. Unspecified abdominal pain      1. Abdominal Pain      Dec 16, 2021  [place] [facility]  [place]  Emer[place]  Chief Complaint: [name] AND ABD PAIN      Recent Inpatient Visit Summary  Date Facility [facility] Type Diagnoses or Chief Complaint    Apr 25, 2022  [facility]  [place]  X[facility]      1. Nausea with vomiting, unspecified      1. Unspecified abdominal pain      1. Unspecified complication of procedure, initial encounter      2. Disease of biliary tract, unspecified      3. Peritoneal abscess      4.  Other bacterial infections of unspecified site      4. Abdominal Pain      5. Extended spectrum beta lactamase (ESBL) resistance      7. Epigastric pain      8. Other disorders of electrolyte and fluid balance, not elsewhere classified      Apr 01, 2022  [facility] [place]  [place]  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  [place]  Surgery      1. Calculus of gallbladder without cholecystitis without obstruction      2. Pruritus, unspecified        Care Team  [facility] Special[facility] Phone Fax Service Dates   [facility] Clinic/Gross: [facility] [phone] Current    PEREZ, [name], M.D. Family Medicine   Current      [facility]  This patient has registered at the [facility]   For more Gross visit: [email or web address]   PLEASE NOTE:     1.   Any care recommendations and other clinical Gross are provided as guidelines or for historical purposes only, and providers should exercise their own clinical judgment when providing care.    2.   You may only use this Gross for purposes of treatment, payment or health care operations activities, and subject to the limitations of applicable [facility] Policies.    3.   You should consult directly with the organization that provided a care guideline or other clinical history with any questions about additional Gross or accuracy or completeness of Gross provided.    ? 2023 [facility] - [email or web address]

## 2022-11-24 NOTE — Discharge Instructions
Emergency Department Discharge Instructions      Summary of your visit  You have been evaluated in the [facility] Emergency Department today for abdominal pain.  Your evaluation included a physical exam and CT scans and bloodwork.  We have not found a serious cause of the abdominal pain at this time. However, some abdominal problems may take more time to appear. Therefore, it is important that you carefully watch for changes in the abdominal pain (such as any new symptoms or worsening of your current condition).     It was a pleasure working with you today and thank you for choosing [place] for your care.     I want you to followup with your Primary Care Doctor within 3 days to discuss your ED visit and reassessment of your symptoms.   Please return to the ED immediately if you develop any worsening or concerning symptoms.     Here are your imaging and lab results from your visit for you to followup with your primary care doctor:     Recent Results (from the past 24 hour(s))   ED INFORMATION EXCHANGE    Collection Time: 11/24/22  5:55 AM   Result Value Ref Range    Emer. Dept. Info Exchange - Care Plan      Emer. Dept. Engineer, mining. Dept. Info Exchange - 30 day Visit Count 1     Emer. Dept. Info Exchange - 180 day Visit Count 4    Basic Metabolic Panel    Collection Time: 11/24/22  6:41 AM   Result Value Ref Range    Sodium 135 135 - 146 mmol/L    Potassium 4.0 3.6 - 5.3 mmol/L    Chloride 105 96 - 106 mmol/L    Total CO2 20 20 - 30 mmol/L    Anion Gap 10 8 - 19 mmol/L    Glucose 82 65 - 99 mg/dL    Creatinine 1.61 0.96 - 1.30 mg/dL    Estimated GFR 60 See GFR Additional Information mL/min/1.32m2    GFR Additional Information See Comment     Urea Nitrogen 12 7 - 22 mg/dL    Calcium 9.7 8.6 - 04.5 mg/dL   Lipase    Collection Time: 11/24/22  6:41 AM   Result Value Ref Range    Lipase 17 13 - 69 U/L   Hepatic Funct Panel    Collection Time: 11/24/22  6:41 AM   Result Value Ref Range    Total Protein 7.3 6.1 - 8.2 g/dL    Albumin 4.5 3.9 - 5.0 g/dL    Bilirubin,Total 0.3 0.1 - 1.2 mg/dL    Bilirubin,Conjugated <0.2 <=0.3 mg/dL    Alkaline Phosphatase 82 37 - 113 U/L    Aspartate Aminotransferase 18 13 - 62 U/L    Alanine Aminotransferase <5 (L) 8 - 70 U/L   Prothrombin Time Panel    Collection Time: 11/24/22  6:41 AM   Result Value Ref Range    Prothrombin Time 12.6 11.5 - 14.4 seconds    INR 1.0 .   CBC    Collection Time: 11/24/22  6:41 AM   Result Value Ref Range    White Blood Cell Count 7.58 4.16 - 9.95 x10E3/uL    Red Blood Cell Count 4.56 3.96 - 5.09 x10E6/uL    Hemoglobin 11.4 (L) 11.6 - 15.2 g/dL    Hematocrit 40.9 81.1 - 45.2 %    Mean Corpuscular Volume 81.6  79.3 - 98.6 fL    Mean Corpuscular Hemoglobin 25.0 (L) 26.4 - 33.4 pg    MCH Concentration 30.6 (L) 31.5 - 35.5 g/dL    Red Cell Distribution Width-SD 51.9 (H) 36.9 - 48.3 fL    Red Cell Distribution Width-CV 17.6 (H) 11.1 - 15.5 %    Platelet Count, Auto 496 (H) 143 - 398 x10E3/uL    Mean Platelet Volume 8.5 (L) 9.3 - 13.0 fL    Nucleated RBC%, automated 0.0 No Ref. Range %    Absolute Nucleated RBC Count 0.00 0.00 - 0.00 x10E3/uL    Neutrophil Abs (Prelim) 4.94 See Absolute Neut Ct. x10E3/uL   Differential, Automated    Collection Time: 11/24/22  6:41 AM   Result Value Ref Range    Neutrophil Percent, Auto 65.1 No Ref. Range %    Lymphocyte Percent, Auto 27.4 No Ref. Range %    Monocyte Percent, Auto 5.7 No Ref. Range %    Eosinophil Percent, Auto 1.1 No Ref. Range %    Basophil Percent, Auto 0.4 No Ref. Range %    Immature Granulocytes% 0.3 No Reference Range %    Absolute Neut Count 4.94 1.80 - 6.90 x10E3/uL    Absolute Lymphocyte Count 2.08 1.30 - 3.40 x10E3/uL    Absolute Mono Count 0.43 0.20 - 0.80 x10E3/uL    Absolute Eos Count 0.08 0.00 - 0.50 x10E3/uL    Absolute Baso Count 0.03 0.00 - 0.10 x10E3/uL    Absolute Immature Gran Count 0.02 0.00 - 0.04 x10E3/uL        CT abd+pelvis w contrast   Final Result by Dalbert Batman., MD (12/05 1005)   IMPRESSION:      1.  Slight increase in now mild to moderate intra and extrahepatic biliary dilatation compared to prior exam, with no calcified choledocholithiasis or obstructing lesion identified. This is likely physiologic for this patient, present with overall    similar to slight fluctuation in appearance since at least 2016, particularly given normal serum markers of biliary obstruction on the current labs. There is minimal interval increase in diffuse pancreatic ductal dilatation. If clinical concern for    distal CBD obstruction/narrowing, however, MRCP may be helpful.   2.  Apparent mild wall thickening of the cecum through proximal transverse colon is likely exaggerated by underdistention. No adjacent fat stranding. The appearance is overall similar to prior exam.                  Signed by: Quentin Cornwall   11/24/2022 10:05 AM      XR chest pa+lat (2 views)   Final Result by Dalbert Batman., MD (12/05 0733)   IMPRESSION:        Central and lower lobe mild bronchial wall thickening may represent underlying mild airways disease/inflammation. No evidence of pneumonia.      Mildly hyperinflated lungs with emphysematous change.         Signed by: Quentin Cornwall   11/24/2022 7:33 AM               Follow-Up  Please follow up with your primary care physician within three days after being discharged from the ER - you can call to schedule an appointment.  You can find a primary care physician at [facility] by calling [phone].    You can also follow-up with a gastroenterologist at [facility] by calling [phone].  If you do not have a Primary Care Physician, please call your insurance company or  you may call [phone] to establish care with a [facility] physician.  If you are uninsured, please call 2-1-1 to find a free or low-cost clinic in your area. 2-1-1 [place] is the central source for providing information and referrals for all health and human services in [facility]. Our 2-1-1 phone line is open 24 hours, 7 days a week, with trained [employer] prepared to offer help with any situation, any time. Our community services go far beyond phone referrals - explore our website to learn more. If you are calling from outside [facility] or cannot directly dial 2-1-1, you can call [phone].      Return to the Emergency Department if you experience:  Fevers 100.4 ?F or greater  Worsening or uncontrolled pain  Persistent nausea and vomiting  Inability to tolerate food or fluids by mouth  Bloody stools or vomit  Black or tarry stools  Any other concerning symptoms     Thank you for choosing [facility] for your care. It was a pleasure taking part in your care today, and we wish you the best!

## 2022-11-24 NOTE — ED Notes
Report given to [employer]. Pt still at Xray at this time.

## 2022-11-24 NOTE — ED Provider Notes
[facility]  Emergency Department Service Report    Donna Gross 56 y.o. female , presents with Abdominal Pain      Triage   Arrived on 11/24/2022 at 5:55 AM   Arrived by Walk-in [14]    ED Triage Vitals   Temp Temp Source BP Heart Rate Resp SpO2 O2 Device Pain Score Weight   11/24/22 0558 11/24/22 0558 11/24/22 0558 11/24/22 0558 11/24/22 0558 11/24/22 0558 11/24/22 0700 11/24/22 0600 11/24/22 0600   36.7 ?C (98.1 ?F) Temporal 114/75 81 18 96 % None (Room air) Ten 49.2 kg (108 lb 7.5 oz)       Pre hospital care:       Allergies   Allergen Reactions    Hydrocodone Other (See Comments)     ''burns a hole in stomach''  Tolerates hydromorphone    Ibuprofen Other (See Comments)     GI discomfort    ''hurts my stomach''    Morphine Itching     Tolerates hydromorphone       History   HPI     Donna Gross is a 56 y.o. female with PMHx of emphysema, fibromyalgia, gastritis, GERD, blood transfusion, pancreatitis, cholecystectomy, presents to the ED with abdominal pain. Patient reports abdominal pain with diarrhea has been recurrent since cholecystectomy in April 2023. She states her symptoms have been worse over the past 48 hours. States stool is soft but denies tarry or bloody stool. Denies fevers, chills, headache, chest pain, nausea, vomiting, dysuria, hematuria.           Past Medical History:   Diagnosis Date    Anxiety     Depression     Emphysema, unspecified (HCC/RAF)     Emphysema, unspecified (HCC/RAF)     Fibromyalgia     Gastritis     GERD (gastroesophageal reflux disease)     History of blood transfusion     Pancreatitis         Past Surgical History:   Procedure Laterality Date    ABDOMINAL SURGERY      COLON SURGERY          Past Family History   family history includes Diabetes in her brother and father; Heart disease in her mother.                 Past Social History   she reports that she has been smoking cigarettes. She uses smokeless tobacco. She reports that she does not drink alcohol and does not use drugs. No history on file for sexual activity.       Physical Exam   Physical Exam  Vitals and nursing note reviewed.   Constitutional:       General: She is not in acute distress.     Appearance: Normal appearance. She is not ill-appearing or toxic-appearing.      Comments: Appears thin.    HENT:      Head: Normocephalic and atraumatic.      Nose: Nose normal.   Eyes:      Extraocular Movements: Extraocular movements intact.   Cardiovascular:      Rate and Rhythm: Normal rate.      Pulses: Normal pulses.      Heart sounds: Normal heart sounds.   Pulmonary:      Effort: Pulmonary effort is normal.      Breath sounds: Normal breath sounds.   Abdominal:      General: Abdomen is flat.  Palpations: Abdomen is soft.      Tenderness: There is abdominal tenderness. There is no guarding or rebound.      Comments: Multiple abdominal scars. General abdominal tenderness without rebound or guarding.    Musculoskeletal:         General: Normal range of motion.      Cervical back: Normal range of motion and neck supple.   Skin:     General: Skin is warm.   Neurological:      Mental Status: She is alert and oriented to person, place, and time.      Comments: Moving all extremities equally and willfully   Psychiatric:         Mood and Affect: Mood normal.         Behavior: Behavior normal.         Thought Content: Thought content normal.         Judgment: Judgment normal.         ED Course     ED Course as of 11/24/22 1053   Tue Nov 24, 2022   0822 No significant metabolic or electrolyte abnormalities.      [CR]   0945 Moderate stool burden on my interpretation of CT.  [CR]   1009    1.  Slight increase in now mild to moderate intra and extrahepatic biliary dilatation compared to prior exam, with no calcified choledocholithiasis or obstructing lesion identified. This is likely physiologic for this patient, present with overall   similar to slight fluctuation in appearance since at least 2016, particularly given normal serum markers of biliary obstruction on the current labs. There is minimal interval increase in diffuse pancreatic ductal dilatation. If clinical concern for   distal CBD obstruction/narrowing, however, MRCP may be helpful.  2.  Apparent mild wall thickening of the cecum through proximal transverse colon is likely exaggerated by underdistention. No adjacent fat stranding. The appearance is overall similar to prior exam.   [CR]   1009 No abnormalities on LFT. Low concern for obstruction or infection. Will follow up with PCP.  [CR]   1010 tr [CR]   1010 I discussed the diagnosis and treatment plan with the patient. I provided strict return precautions. I answered all of the patient's questions, and they are amenable to the treatment plan. Patient is safe to be discharged to home.     [CR]      ED Course User Index  [CR] Corky Mull., MD       Laboratory Results     Labs Reviewed   HEPATIC FUNCT PANEL - Abnormal; Notable for the following components:       Result Value    Alanine Aminotransferase <5 (*)     All other components within normal limits   CBC (PERFORMABLE) - Abnormal; Notable for the following components:    Hemoglobin 11.4 (*)     Mean Corpuscular Hemoglobin 25.0 (*)     MCH Concentration 30.6 (*)     Red Cell Distribution Width-SD 51.9 (*)     Red Cell Distribution Width-CV 17.6 (*)     Platelet Count, Auto 496 (*)     Mean Platelet Volume 8.5 (*)     All other components within normal limits   LIPASE - Normal   PROTHROMBIN TIME PANEL - Normal   CBC & AUTO DIFFERENTIAL    Narrative:     The following orders were created for panel order CBC & Plt & Diff.  Procedure  Abnormality         Status                     ---------                               -----------         ------                     ZOX[[id number]]                          Abnormal            Final result               Differential, Automated[[id number]]                          Final result Please view results for these tests on the individual orders.   BASIC METABOLIC PANEL   DIFFERENTIAL, AUTOMATED (PERFORMABLE)       Imaging Results     CT abd+pelvis w contrast   Final Result by Dalbert Batman., MD (12/05 1005)   IMPRESSION:      1.  Slight increase in now mild to moderate intra and extrahepatic biliary dilatation compared to prior exam, with no calcified choledocholithiasis or obstructing lesion identified. This is likely physiologic for this patient, present with overall    similar to slight fluctuation in appearance since at least 2016, particularly given normal serum markers of biliary obstruction on the current labs. There is minimal interval increase in diffuse pancreatic ductal dilatation. If clinical concern for    distal CBD obstruction/narrowing, however, MRCP may be helpful.   2.  Apparent mild wall thickening of the cecum through proximal transverse colon is likely exaggerated by underdistention. No adjacent fat stranding. The appearance is overall similar to prior exam.                  Signed by: Quentin Cornwall   11/24/2022 10:05 AM      XR chest pa+lat (2 views)   Final Result by Dalbert Batman., MD (12/05 0733)   IMPRESSION:        Central and lower lobe mild bronchial wall thickening may represent underlying mild airways disease/inflammation. No evidence of pneumonia.      Mildly hyperinflated lungs with emphysematous change.         Signed by: Quentin Cornwall   11/24/2022 7:33 AM          Administered Medications     Medication Administration from 11/24/2022 0555 to 11/24/2022 1053         Date/Time Order Dose Route Action Action by Comments     11/24/2022 0900 PST sodium chloride 0.9% IV soln bolus 1,000 mL 0 mL Intravenous Stopped Muthalaly, Alby Sabu, RN --     11/24/2022 [phone] PST sodium chloride 0.9% IV soln bolus 1,000 mL 1,000 mL Intravenous New Bag/ Syringe/ Cartridge George Ina, RN --     11/24/2022 [phone] PST metoclopramide 5 mg/mL inj 10 mg 10 mg IV Push Given George Ina, RN --     11/24/2022 [phone] PST diphenhydrAMINE 50 mg/mL inj 25 mg 25 mg IV Push Given George Ina, RN --     11/24/2022 0704 PST ondansetron 4 mg/2 mL inj 4 mg  4 mg Intravenous Not Given George Ina, RN --     11/24/2022 0813 PST acetaminophen tab 1,000 mg 1,000 mg Oral Not Given Oliver Pila, RN --     11/24/2022 6440 PST iohexol (Omnipaque) 350 mg/mL inj 100 mL 100 mL Intravenous Given Wonda Cerise --     11/24/2022 0831 PST HYDROmorphone 1 mg/mL inj 0.5 mg 0.5 mg IV Push Given Francesca Oman, RN --     11/24/2022 3474 PST ketorolac 30 mg/mL inj 15 mg 15 mg IV Push Given Muthalaly, Alby Sabu, RN --     11/24/2022 1033 PST HYDROmorphone 1 mg/mL inj 0.5 mg 0.5 mg IV Push Given Muthalaly, Alby Sabu, RN --            Procedures   Procedural Sedation  Procedures    Medical Decision Making   Donna Gross is a 56 y.o. female with PMHx of emphysema, fibromyalgia, gastritis, GERD, blood transfusion, pancreatitis, cholecystectomy, presents to the ED with abdominal pain. Given history and exam concerning for intra abdominal obstruction and/or infection. No need to check urinary studies as patient denies any urinary symptoms. Will obtain CT pelvis and GI labs and provide symptomatic relief.     Medical Decision Making  Amount and/or Complexity of Data Reviewed  Labs: ordered.  Radiology: ordered.    Risk  OTC drugs.  Prescription drug management.      Clinical Impression     1. Abdominal pain, acute          Prescriptions     New Prescriptions    ONDANSETRON 4 MG TABLET    Take 1 tablet (4 mg total) by mouth every six (6) hours as needed for Nausea.       Disposition and Follow-up   Disposition: Discharge [1]    No future appointments.    Follow up with:  Kelle Darting, MD  [address]  [place] [address]  [phone]    Schedule an appointment as soon as possible for a visit         Return precautions are specified on After Visit Summary.      Scribe Signature   I, Sharalyn Ink , have acted as a Stage manager for patient Donna Gross on behalf of Dr. Gerlene Burdock at 11/24/2022 at 6:16 AM. All documentation underwent a comprehensive review by the listed physician(s) and received their approval upon signing.                Corky Mull., MD  11/24/22 [phone]

## 2022-11-24 NOTE — ED Notes
Pt transported for X-ray.

## 2022-11-24 NOTE — ED Notes
Patient states she is still in pain 8/10 and requesting more Dilaudid. I spoke with MD Gerlene Burdock who states patient can have Toradol but not more Dilaudid at this time. Patient explained the plan of care. She was not happy that she was not getting more Dilaudid since it is stronger than Toradol and helped with her pain. I explained to patient that since the Dilaudid helped initially to try the Toradol to see if it will continue to help bring her pain down. Patient is agreeable at this time with the plan.

## 2022-12-16 IMAGING — CT CT ABDOMEN PELVIS WITH AND WITHOUT CONTRAST
4 of 7 series · 12 of 32 positions shown, 16 images · non-contrast
Comparison: 11/23/2022

Hematuria, pelvic pain, RT; SURGICAL HX- appendectomy, umbilical hernia repair
FINAL REPORT:
INDICATION: CLINICAL HISTORY: Gross hematuria.
TECHNIQUE: CT was performed from the lung apices through the pelvis glands with and without IV contrast including delayed images.
Sagittal and coronal planes using lung and soft tissue algorithms. Dose reduction measures were considered and/or implemented.
Unless the patient's specific circumstances suggest otherwise, any liver
lesion 0.5 cm or less, any cystic kidney lesion less than 1.0 cm, and/or
any adrenal lesion 1.0 cm or less not otherwise characterized in this
report as processing suspicious or indeterminate imaging features is/are
most likely to be benign and do not require follow-up imaging or biopsy.

[Series 2: w/o · axial · non-contrast · 0.82mm/px · z∈[-426,-211]mm · 3 of 173 slices shown, 7 images]
[im 44/173  soft-tissue]
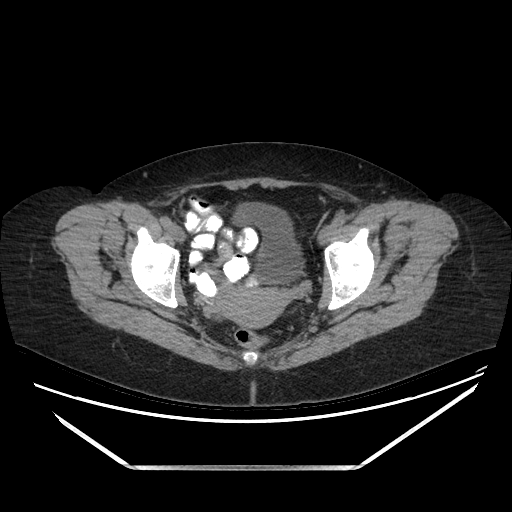
[im 44/173  lung]
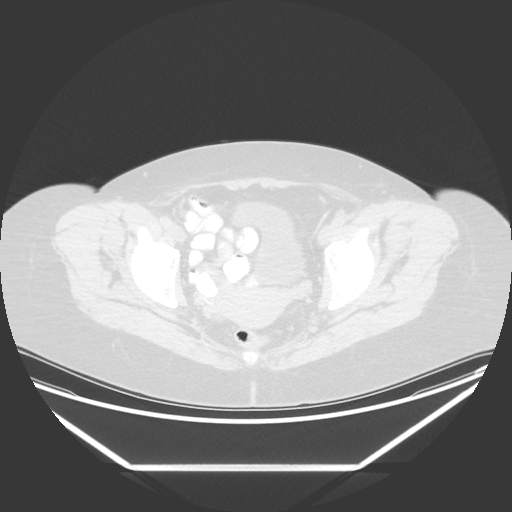
[im 44/173  bone]
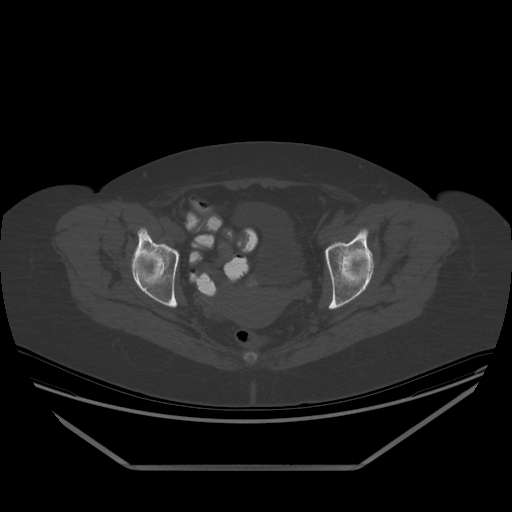
[im 87/173  soft-tissue]
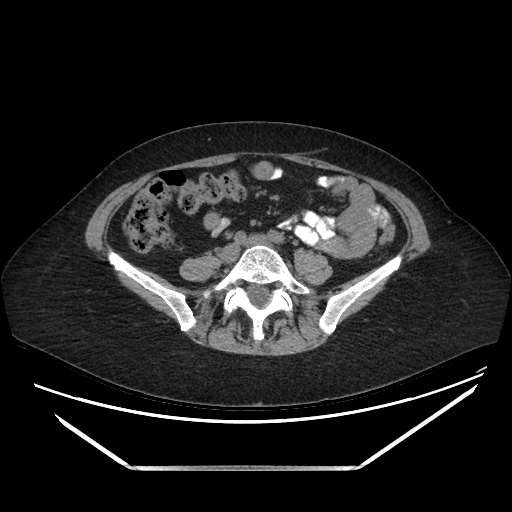
[im 87/173  lung]
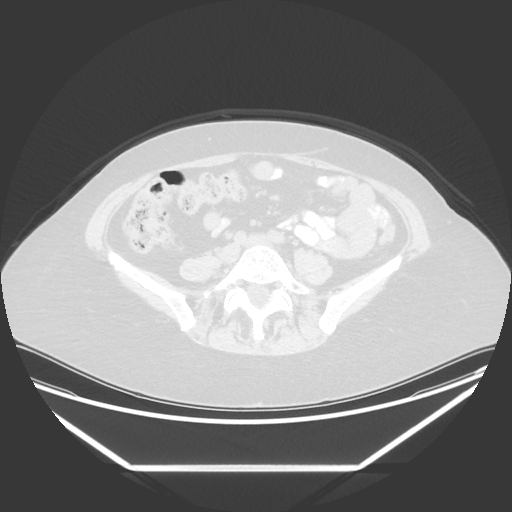
[im 130/173  soft-tissue]
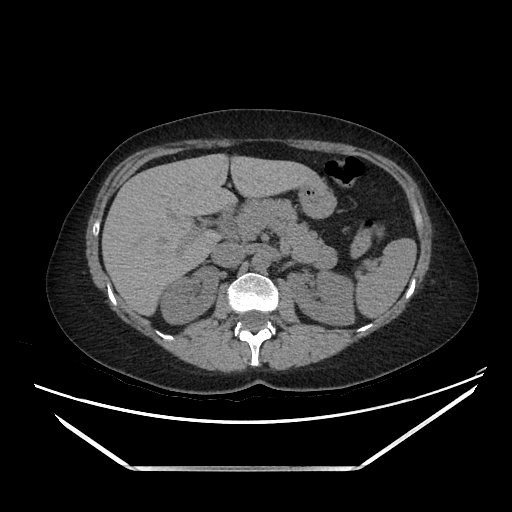
[im 130/173  lung]
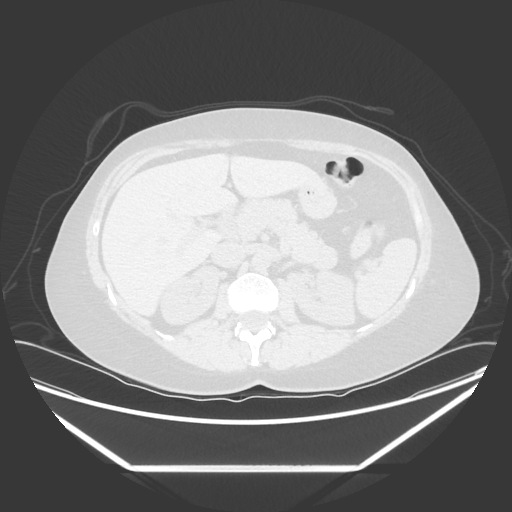

[Series 3: with · axial · 0.82mm/px · z∈[-426,-211]mm · 3 of 173 slices shown]
[im 44/173  soft-tissue]
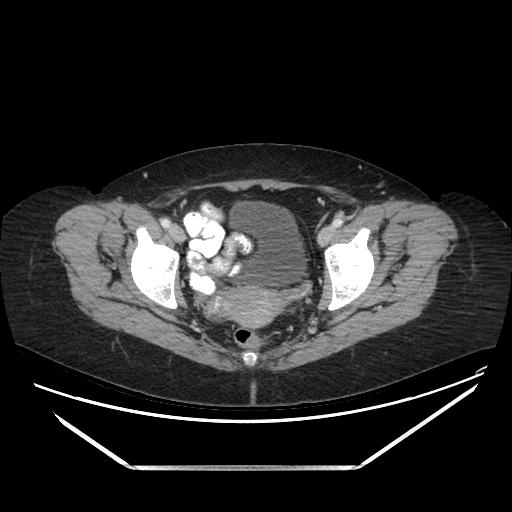
[im 87/173  soft-tissue]
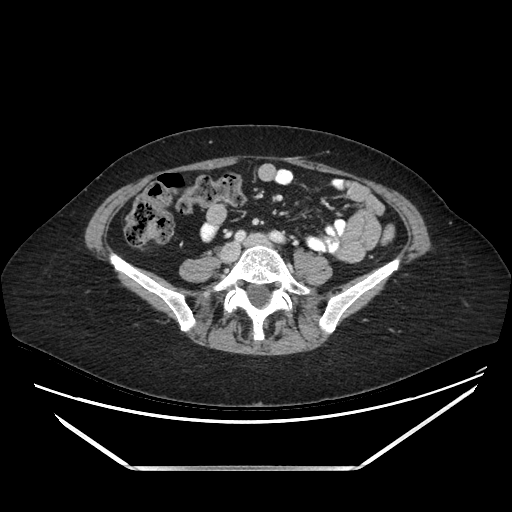
[im 130/173  soft-tissue]
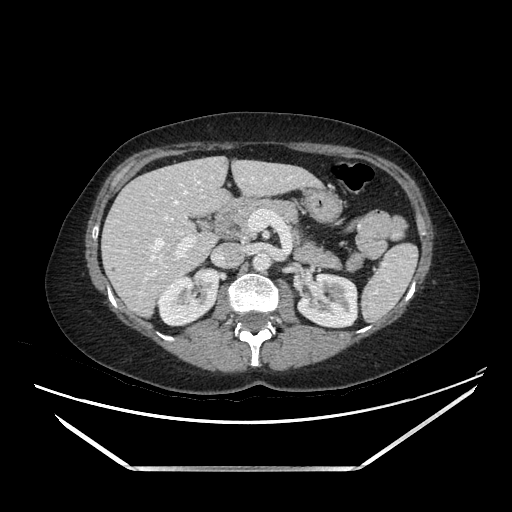

[Series 5: delay · axial · delayed · 0.82mm/px · z∈[-406,-276]mm · 2 of 156 slices shown]
[im 52/156  soft-tissue]
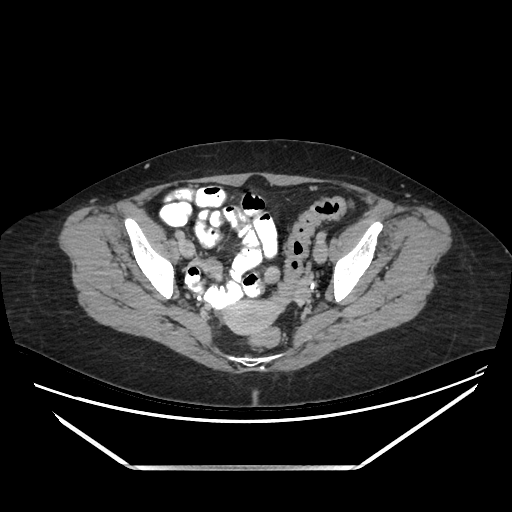
[im 104/156  soft-tissue]
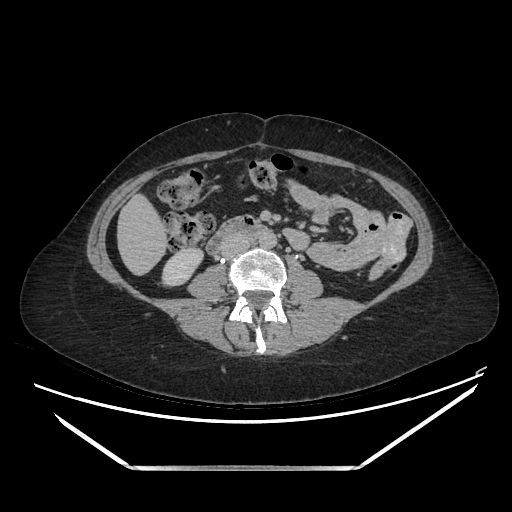

[Series 601: sagittal · sagittal · 0.84mm/px · 4 of 211 slices shown]
[im 43/211  soft-tissue]
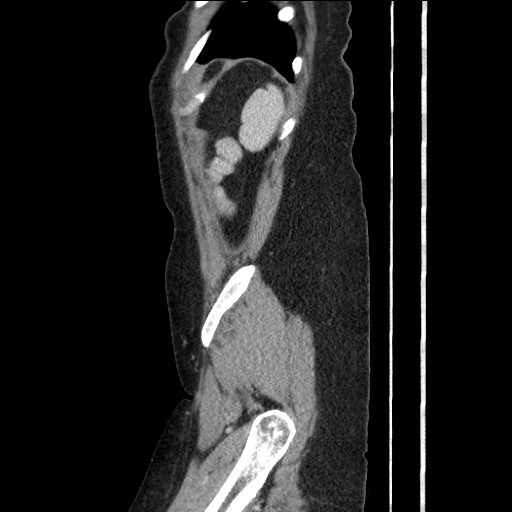
[im 85/211  soft-tissue]
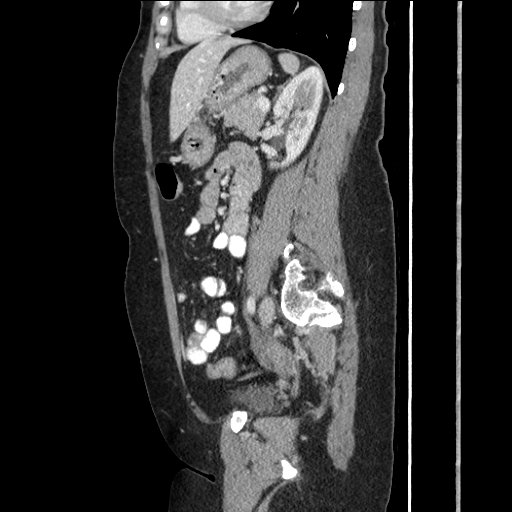
[im 127/211  soft-tissue]
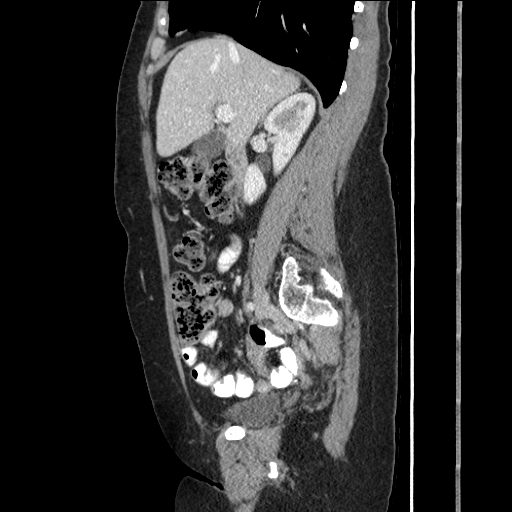
[im 169/211  soft-tissue]
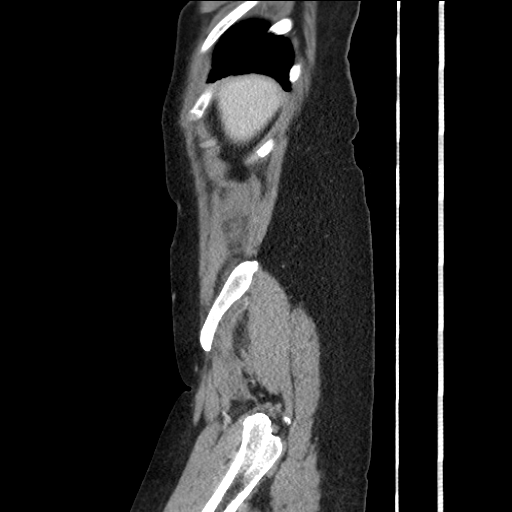

[12 of 32 positions shown; findings below may reference images not displayed]

FINDINGS: LUNGS AND AIRWAYS:
Linear scarring is seen in the lingula.
CT Abdomen:
Liver: Liver is normal in density without intrahepatic ductal dilation..
Gallbladder:   Normal
Pancreas: The pancreas is normal intensity without ductal dilation or mass lesions.
Spleen: The spleen is normal in size and density..
Adrenal: The adrenal glands are symmetric.
Kidneys: The kidneys are symmetric in size without hydronephrosis. There is no significant compression of left renal vein.
GI: There is a duodenal diverticulum:The bowel is normal in contour and caliber without wall thickening.
CT pelvis:
Bladder: Bladder is unremarkable
Reproductive Organs: Normal
Lymph Nodes: No significant mesenteric, retroperitoneal, pelvic or inguinal lymphadenopathy is appreciated
Other findings:None
Bony Structures: No aggressive bone lesions.
IMPRESSION: No findings to explain patient's hematuria. Urologic evaluation warranted.
Is the patient pregnant?
Unknown

## 2023-01-23 IMAGING — MR MRI KNEE LT WITHOUT IV CONTRAST
7 series · 40 of 40 positions shown · non-contrast
Comparison: None.

FINAL REPORT:
MRI KNEE LT WITHOUT IV CONTRAST
INDICATION: Pain.
TECHNIQUE: Multiplanar multisequence MR imaging of the left knee was performed without contrast.

[Series 2: survey_mpr_sag · sagittal · left · 2.0mm · 0.87mm/px · 4 of 15 slices shown]
[im 1/15]
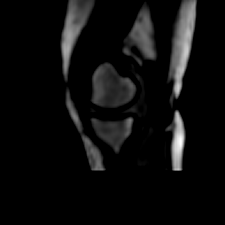
[im 5/15]
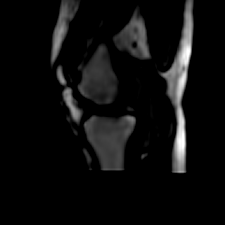
[im 10/15]
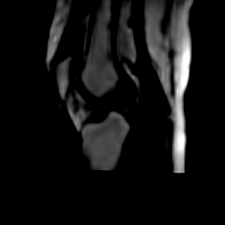
[im 15/15]
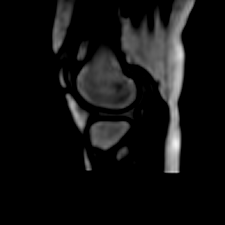

[Series 3: survey_mpr_cor · coronal · left · 2.0mm · 0.87mm/px · 2 of 9 slices shown]
[im 1/9]
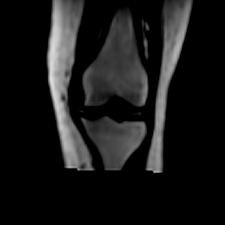
[im 9/9]
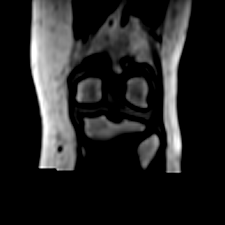

[Series 4: survey_mpr_(person_name) · axial · left · 2.0mm · 0.87mm/px · z∈[-64,+6]mm · 3 of 15 slices shown]
[im 1/15]
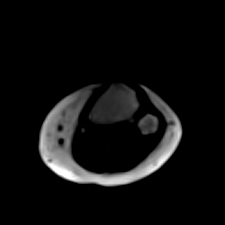
[im 8/15]
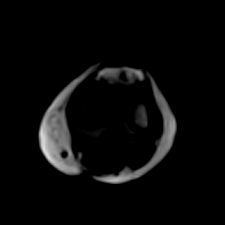
[im 15/15]
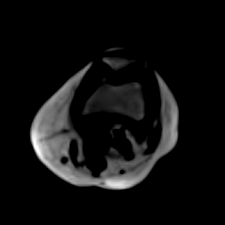

[Series 5: PD · sagittal · left · 3.0mm · 0.42mm/px · 7 of 32 slices shown]
[im 1/32]
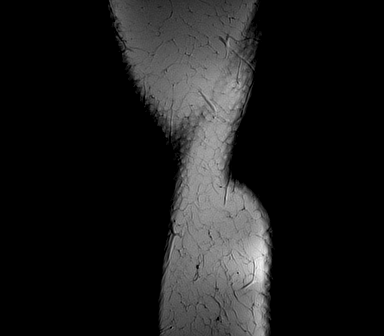
[im 6/32]
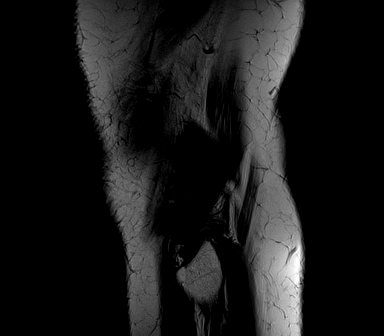
[im 11/32]
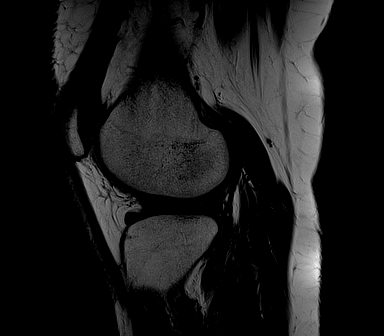
[im 16/32]
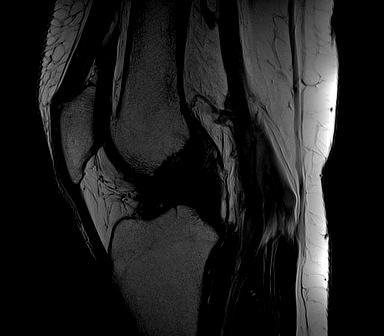
[im 21/32]
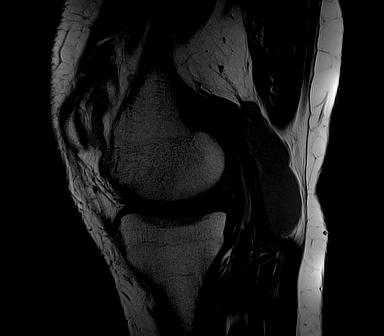
[im 26/32]
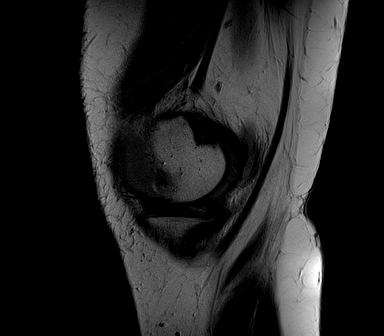
[im 32/32]
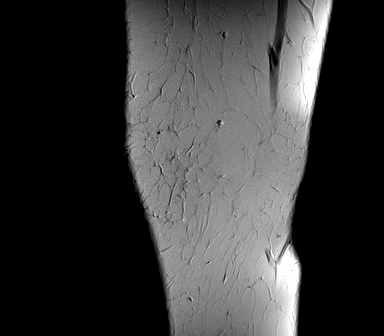

[Series 6: PD fat-sat · sagittal · left · 3.0mm · 0.42mm/px · 7 of 32 slices shown (1 of 3)]
[im 1/32]
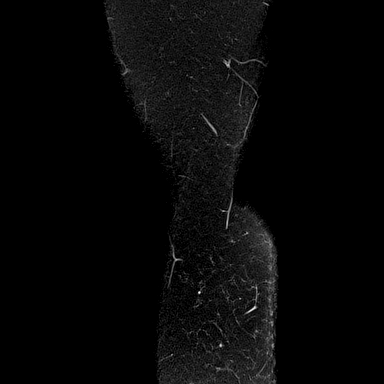
[im 6/32]
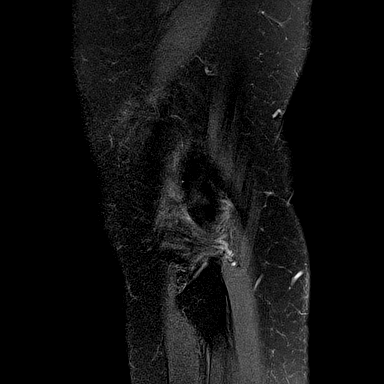
[im 11/32]
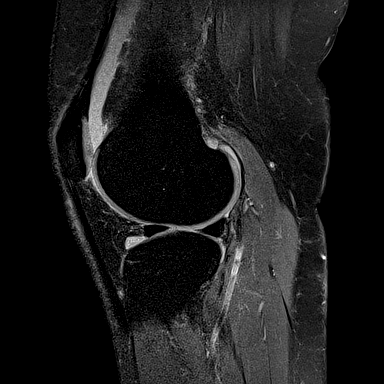
[im 16/32]
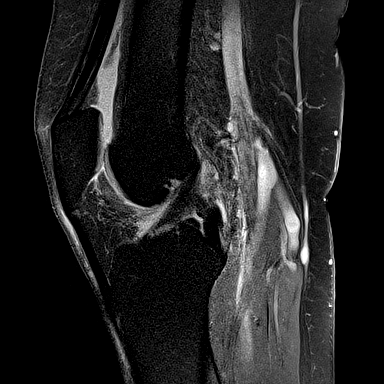
[im 21/32]
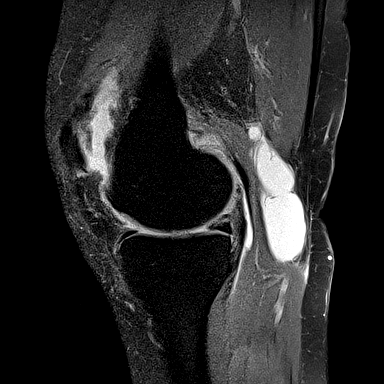
[im 26/32]
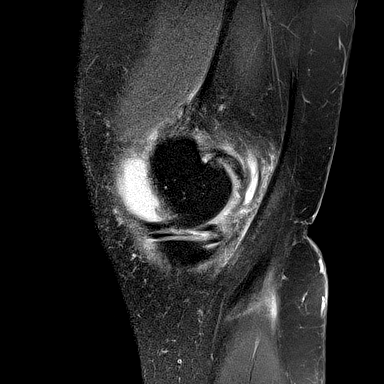
[im 32/32]
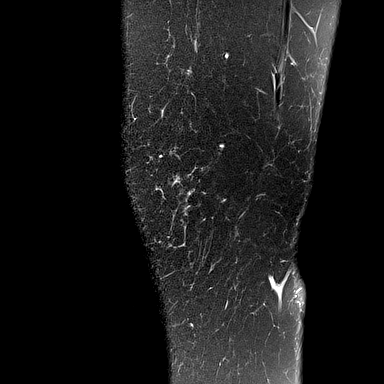

[Series 7: PD fat-sat · coronal · left · 3.0mm · 0.42mm/px · 8 of 35 slices shown (2 of 3)]
[im 1/35]
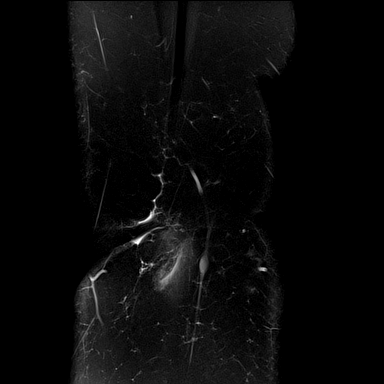
[im 5/35]
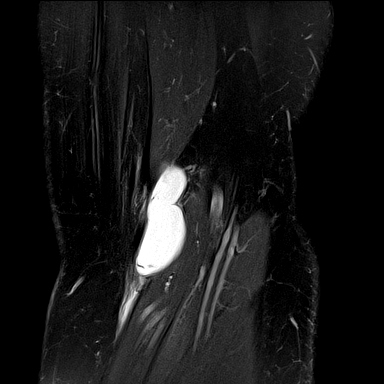
[im 10/35]
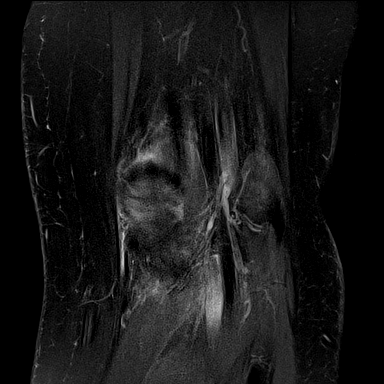
[im 15/35]
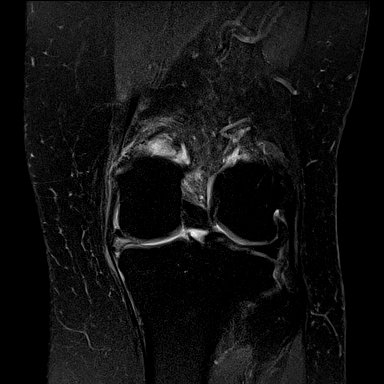
[im 20/35]
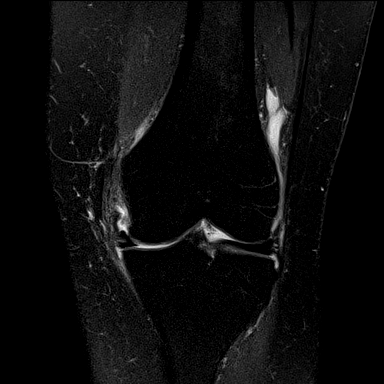
[im 25/35]
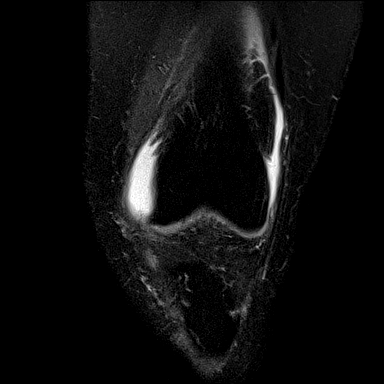
[im 30/35]
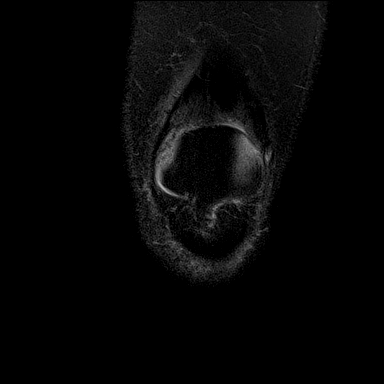
[im 35/35]
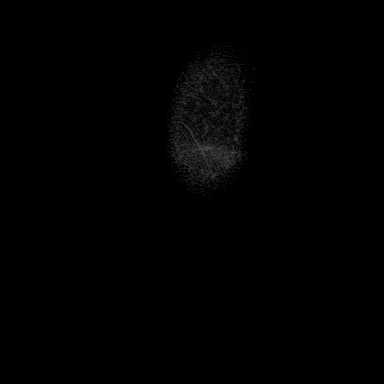

[Series 8: PD fat-sat · axial · left · 3.0mm · 0.48mm/px · z∈[-76,+53]mm · 9 of 40 slices shown (3 of 3)]
[im 1/40]
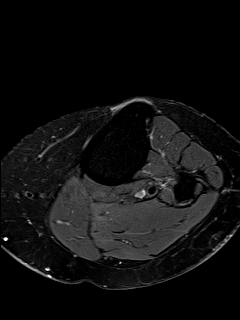
[im 5/40]
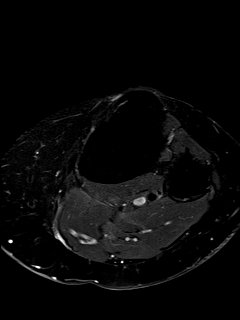
[im 10/40]
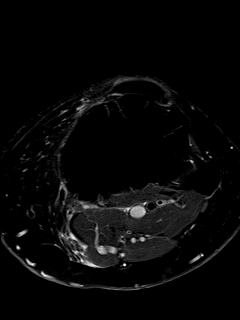
[im 15/40]
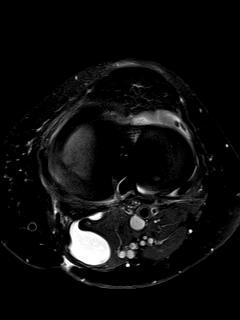
[im 20/40]
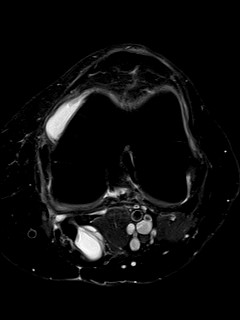
[im 25/40]
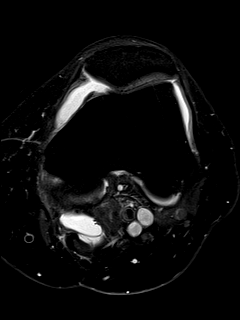
[im 30/40]
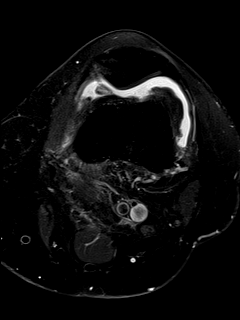
[im 35/40]
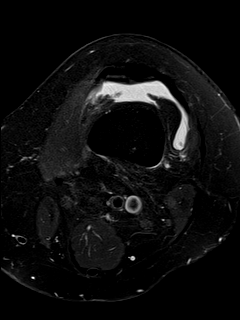
[im 40/40]
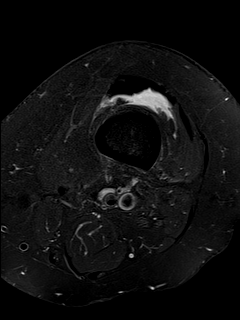

[40 of 40 positions shown; findings below may reference images not displayed]

FINDINGS: The anterior and posterior cruciate ligaments are intact. The lateral meniscus is normal. There is longitudinal horizontal tearing in the posterior horn and body of the medial meniscus. Mild thickening of the medial collateral ligament. There is mild soft tissue edema overlying the medial collateral ligament which is probably related to the meniscal pathology. The lateral supporting structures are unremarkable.
There is low-grade chondral loss in the medial femorotibial compartment. Low-grade chondral loss in the posterior aspect of the lateral tibial plateau. Moderate grade chondral loss in the medial patellar facet. No significant trochlear chondral loss. No evidence of a recent stress or traumatic fracture. No bone contusions. Bony alignment is normal.
The extensor mechanism is intact. The fat pads are unremarkable.
There is a small joint effusion with mild synovial proliferation. There is a moderate sized popliteal cyst with small amount of fluid leakage. No muscle atrophy.
IMPRESSION: 
IMPRESSION: 1. Longitudinal horizontal tearing in the posterior horn and body of the medial meniscus.
2. Moderate grade chondral loss in the medial patellar facet.
3. Small joint effusion with moderate sized popliteal cyst.
Is the patient pregnant?
No

## 2023-01-25 ENCOUNTER — Inpatient Hospital Stay
Admit: 2023-01-25 | Discharge: 2023-02-03 | Disposition: A | Discharge status: Home / Self Care | Payer: PRIVATE HEALTH INSURANCE | Source: Home / Self Care

## 2023-01-25 ENCOUNTER — Ambulatory Visit: Payer: PRIVATE HEALTH INSURANCE

## 2023-01-25 DIAGNOSIS — E860 Dehydration: Secondary | ICD-10-CM

## 2023-01-25 DIAGNOSIS — R112 Nausea with vomiting, unspecified: Secondary | ICD-10-CM

## 2023-01-25 DIAGNOSIS — R109 Unspecified abdominal pain: Secondary | ICD-10-CM

## 2023-01-25 DIAGNOSIS — N179 Acute kidney failure, unspecified: Secondary | ICD-10-CM

## 2023-01-25 DIAGNOSIS — R1084 Generalized abdominal pain: Secondary | ICD-10-CM

## 2023-01-25 LAB — Comprehensive Metabolic Panel
ALBUMIN: 4.6 g/dL (ref 3.9–5.0)
ESTIMATED GFR 2021 CKD-EPI: 45 mL/min/{1.73_m2} (ref 13–62)

## 2023-01-25 LAB — CBC: RED CELL DISTRIBUTION WIDTH-SD: 55.9 fL — ABNORMAL HIGH (ref 36.9–48.3)

## 2023-01-25 LAB — Lipase: LIPASE: 32 U/L (ref 13–69)

## 2023-01-25 LAB — UA,Dipstick

## 2023-01-25 LAB — Pregnancy Test,Blood: PREGNANCY TEST,BLOOD: NEGATIVE

## 2023-01-25 LAB — UA,Microscopic: RBCS HPF: 1 {cells}/[HPF] (ref 0–2)

## 2023-01-25 MED ADMIN — ENOXAPARIN SODIUM 40 MG/0.4ML IJ SOSY: 40 mg | SUBCUTANEOUS | @ 22:00:00 | Stop: 2023-01-28

## 2023-01-25 MED ADMIN — HYDROMORPHONE HCL 1 MG/ML IJ SOLN: .5 mg | INTRAVENOUS | @ 15:00:00 | Stop: 2023-01-25 | NDC 00409128331

## 2023-01-25 MED ADMIN — IODIXANOL 320 MG/ML IV SOLN: 100 mL | INTRAVENOUS | @ 17:00:00 | Stop: 2023-01-25 | NDC 65219038310

## 2023-01-25 MED ADMIN — DEXTROSE 5 %/LACTATED RINGERS: 100 mL/h | INTRAVENOUS | @ 22:00:00 | Stop: 2023-01-30 | NDC 00338012504

## 2023-01-25 MED ADMIN — HYDROMORPHONE HCL 1 MG/ML IJ SOLN: .5 mg | INTRAVENOUS | @ 18:00:00 | Stop: 2023-01-25 | NDC 00409128331

## 2023-01-25 MED ADMIN — SODIUM CHLORIDE 0.9 % IV BOLUS: 1000 mL | INTRAVENOUS | @ 15:00:00 | Stop: 2023-01-25 | NDC 00338004904

## 2023-01-25 MED ADMIN — NICOTINE 14 MG/24HR TD PT24: 14 mg | TRANSDERMAL | @ 22:00:00 | Stop: 2023-02-04 | NDC 00536589588

## 2023-01-25 MED ADMIN — ONDANSETRON HCL 4 MG/2ML IJ SOLN: 4 mg | INTRAVENOUS | @ 15:00:00 | Stop: 2023-01-25 | NDC 60505613000

## 2023-01-25 MED ADMIN — OXYCODONE HCL 5 MG PO TABS: 15 mg | ORAL | @ 22:00:00 | Stop: 2023-01-27 | NDC 68084035411

## 2023-01-25 NOTE — Consults
Pharmaceutical Services - Medication Reconciliation Note - ED    Patient Name: Donna Gross  Medical Record Number: [id number]  Admit date: 01/25/2023 12:15 PM    Age: 57 y.o.  Sex: female  Allergies:   Allergies   Allergen Reactions    Hydrocodone Other (See Comments)     ''burns a hole in stomach''  Tolerates hydromorphone    Ibuprofen Other (See Comments)     GI discomfort    ''hurts my stomach''    Morphine Itching     Tolerates hydromorphone     Height:   Most recent documented height   01/25/23 1.626 m (5' 4'')     Actual Weight:   Most recent documented weight   01/25/23 46.1 kg   11/24/22 49.2 kg     Diagnosis: The patient is currently admitted with the following concerns/issues: Principal Problem:    Abdominal pain (POA: Yes)      Reported Medication History   I spoke with the patient at bedside and used [employer] (outpatient Rx fill history database) to update the home medication list for this hospital admission.        PTA Medication List (discrepancies are noted)   Prior to Admission medications as of 01/25/23 1414   Medication Sig Notes Last Dose   acetaminophen 500 mg tablet 1,000 mg, Oral, Every 6 hours PRN    at >Month   albuterol 90 mcg/act inhaler 2 puffs, Inhalation, Every 6 hours PRN   Past Week   B Complex Vitamins (VITAMIN B COMPLEX) capsule 1 capsule, Oral, Daily   Past Week   Cholecalciferol (VITAMIN D3) 50 mcg (2000 units) TABS 50 mcg, Oral, Daily   Past Week   cholestyramine 4 g/dose powder 4 g, Oral, 3 times daily with meals   Past Week   clobetasol 0.05% cream Topical, 3 times daily PRN   Past Month   cyanocobalamin 1000 mcg tablet 1,000 mcg, Oral, Daily   Past Week   DULoxetine 30 mg DR capsule 30 mg, Oral, Daily   01/24/2023   fluticasone-salmeterol 250-50 mcg/act diskus inhaler 1 puff, Inhalation, 2 times daily   01/24/2023   ipratropium-albuterol 20-100 mcg/act inhaler 1 puff, Inhalation, Every 6 hours PRN   Past Week   ondansetron 4 mg tablet 4 mg, Oral, Every 6 hours PRN   Past Week ondansetron 8 mg tablet 8 mg, Oral, Daily PRN   Past Week   oxyCODONE-acetaminophen (PERCOCET) 7.5-325 MG per tablet 1 tablet, Oral, Every 6 hours PRN   Past Week   pancrelipase, Lip-Prot-Amyl, (CREON) 36000 units DR capsule 36,000 units of lipase, Oral, 3 times daily with meals   Past Week   pantoprazole 40 mg DR tablet 40 mg, Oral, Daily   01/24/2023   QUEtiapine Fumarate (SEROQUEL PO) 150 mg, Oral, 2 times daily No prescription fill hx in last 6 months. Per [facility] no prescription fill hx for this medication for pt 01/24/2023     The patient's allergies and medications have been reviewed and updated.       Raquel Barrientos, 01/25/2023, 1:48 PM  -------------------------------------------------------------------------------------------------------------------  I have reviewed the medication list compiled by the medication reconciliation pharmacy technician and agree with the findings.      Reconciliation  The assessment and reconciliation of admission and inpatient orders with PTA medication list is complete. There are no issues requiring follow up at this time.     Contacted NP McMenamy regarding completion of med rec    This note represents our good  faith effort to obtain the best possible medication history from all attainable sources at the time of reconciliation    Micah Galeno PharmD, APh, BCPS  Transitions of Care Pharmacist  [phone]

## 2023-01-25 NOTE — ED Notes
[facility]?NOTIFICATION?01/25/2023 06:33?Donna Gross?MRN: [id number]    [name]:   UJW:?[id number]  Account [id number]  Billing Account [id number]      Criteria Met      History of Sepsis Dx    Security and Safety  No Security Events were found.  ED Care Guidelines  There are currently no ED Care Guidelines for this patient. Please check your facility's medical records system.    Flags      History of Sepsis - Patient has received a diagnosis of Sepsis from an acute or post-acute setting. Apply appropriate clinical planning practices; to learn more visit [email or web address] / Attributed By: [employer] / Attributed On: 04/09/2022         Prescription Drug Data  No Prescription Drug Data was found.    E.D. Visit Count (12 mo.)  Facility Visits   [facility] 4   [facility] 2   [name] 1   Total 7   Note: Visits indicate total known visits.     Recent Emergency Department Visit Summary  Date Facility [facility] Type Diagnoses or Chief Complaint    Jan 25, 2023  [facility].  [place]     Jul 25, 2022  [name]  [place]  [place]  Chief Complaint: ABD PAIN    Jul 23, 2022  [facility].  [place]  [place]      1. Right lower quadrant pain      1. Abdominal Pain      2. Left lower quadrant pain      May 19, 2022  [facility]  [place].  [place]      Other long term (current) drug therapy      Acquired absence of other specified parts of digestive tract      Unspecified abdominal pain      Chronic obstructive pulmonary disease with (acute) exacerbation      May 18, 2022  [facility] [place].  [place]      1. Unspecified abdominal pain      1. Abdominal Pain      Apr 01, 2022  [facility]  [name]  [place]  Chief Complaint: ABDOMINAL PAIN    Feb 13, 2022  [facility].  [place]      1. Calculus of gallbladder without cholecystitis without obstruction      1. Abdominal Pain        Recent Inpatient Visit Summary  Date Facility [facility] Type Diagnoses or Chief Complaint    Apr 25, 2022  [facility].  [place]  [place]Ray      1. Nausea with vomiting, unspecified      1. Unspecified abdominal pain      1. Unspecified complication of procedure, initial encounter      2. Disease of biliary tract, unspecified      3. Peritoneal abscess      4. Other bacterial infections of unspecified site      4. Abdominal Pain      5. Extended spectrum beta lactamase (ESBL) resistance      7. Epigastric pain      8. 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  [place]  [place]gery      1. Calculus of gallbladder without cholecystitis without obstruction      2. Pruritus, unspecified        Care Team  [facility] Phone Fax Service Dates   [facility] Clinic/Center: [facility] [phone] Current    PEREZ, [name], M.D. Family Medicine   Current      [facility]  This patient has registered at the [facility] Emergency Department  For more information visit: [email or web address]   PLEASE NOTE:     1.   Any care recommendations and other clinical information are provided as guidelines or for historical purposes only, and providers should exercise their own clinical judgment when providing care.    2.   You may only use this information for purposes of treatment, payment or health care operations activities, and subject to the limitations of applicable [facility] Policies.    3.   You should consult directly with the organization that provided a care guideline or other clinical history with any questions about additional information or accuracy or completeness of information provided.    ? 2024 [facility] - [email or web address]

## 2023-01-25 NOTE — H&P
HOSPITALIST HISTORY AND PHYSICAL    DATE OF SERVICE: 01/25/2023  ADMISSION DATE: 01/25/2023    ATTENDING PHYSICIAN: Marianne Sofia, MD    PRIMARY CARE PROVIDER: Kelle Darting    CHIEF COMPLAINT: had concerns including Abdominal Pain (Abdominal pain, vomiting and diarrhea x6 days Denies fever. ).    HPI:  Donna Gross is a 57 y.o. female with hx of anxiety, depression, emphysema, fibromyalgia, gastritis, GERD, colon surgery and pancreatitis who presents to the ED with complaint of abdominal pain with associated nausea, poor PO intake and diarrhea x6 days.       Pt denies cough, fever, SOB and leg swelling. No known sick contacts. Lab work in [facility] ED overall unremarkable H/H 11.3/35.6, Plts 442K, Na 136, Creat 1.3 and Lipase 32. CT Abdomen pelvis obtained demonstrating signs of colitis, for which patient was initiated on empiric ceftriaxone/flagyl. CT imaging shows no acute CT abnormality in the abdomen/pelvis with stable biliary dilatation unchanged per previous imaging.    Patient is s/p cholecystectomy on 03/28/22 during which time a biliary stent was placed.  Biliary stent was subsequently removed 04/2023 without incident.  Patient reports ongoing, chronic abdominal pain with associated poor PO intake and intermittent diarrhea since that time which has required two discrete hospital admissions (07/23, 12/23) for ongoing symptoms.  Patient also reports 50lb weight loss over past year.     ED COURSE:  Last Recorded Vital Signs:    01/25/23 0800   BP: 122/82   Pulse: 55   Resp: 20   Temp: 36.7 ?C (98.1 ?F)   SpO2: 98%       REVIEW OF SYSTEMS:  A complete review of 14 systems was performed. Additional symptoms were otherwise negative and/or non-contributory, except those listed above.    PAST MEDICAL HISTORY:    Past Medical History:   Diagnosis Date    Anxiety     Depression     Emphysema, unspecified (HCC/RAF)     Emphysema, unspecified (HCC/RAF)     Fibromyalgia     Gastritis     GERD (gastroesophageal reflux disease)     History of blood transfusion     Intractable nausea and vomiting 01/23/2020    Pancreatitis        PAST SURGICAL HISTORY:  Past Surgical History:   Procedure Laterality Date    ABDOMINAL SURGERY      COLON SURGERY          FAMILY HISTORY:   Reviewed and non-contributory    HOME MEDICATIONS:  Prior to Admission medications    Medication Sig Start Date End Date Taking? Authorizing Provider   acetaminophen 325 mg tablet Take 2 tablets (650 mg total) by mouth every six (6) hours as needed for Pain. 03/31/22  Yes Dejesus, Katrinka Blazing., NP   albuterol 90 mcg/act inhaler Inhale 2 puffs every six (6) hours as needed for Wheezing or Shortness of Breath.   Yes PROVIDER, HISTORICAL   B Complex Vitamins (VITAMIN B COMPLEX) capsule Take 1 capsule by mouth daily.   Yes PROVIDER, HISTORICAL   Cholecalciferol (VITAMIN D3) 50 mcg (2000 units) TABS Take 1 tablet (50 mcg total) by mouth daily.   Yes PROVIDER, HISTORICAL   cholestyramine 4 g/dose powder Take 1 packet (4 g total) by mouth three (3) times daily with meals. 07/01/22  Yes Eloisa Northern, MD   clobetasol 0.05% cream Apply topically three (3) times daily as needed (rash).   Yes [provider]   cyanocobalamin 1000 mcg tablet Take  1 tablet (1,000 mcg total) by mouth daily.   Yes PROVIDER, HISTORICAL   docusate 100 mg capsule Take 1 capsule (100 mg total) by mouth two (2) times daily While on narcotic pain medication. 03/31/22 03/31/23 Yes Dejesus, Melissa S., NP   fluticasone 220 mcg/act inhaler Inhale 1 puff daily.   Yes PROVIDER, HISTORICAL   ondansetron 4 mg tablet Take 1 tablet (4 mg total) by mouth every six (6) hours as needed for Nausea. 11/24/22  Yes Corky Mull., MD   ondansetron ODT 4 mg disintegrating tablet Take 1 tablet (4 mg total) by mouth every six (6) hours as needed for Nausea or Vomiting. 06/17/22  Yes Oda Cogan J., DO   oxyCODONE-acetaminophen 5-325 mg tablet Take 1 tablet by mouth every six (6) hours as needed. Max Daily Amount: 4 tablets 07/23/22  Yes Cyndie Mull., MD   pancrelipase, Lip-Prot-Amyl, (CREON) 36000 units DR capsule Take 1 capsule (36,000 units of lipase total) by mouth three (3) times daily with meals.   Yes PROVIDER, HISTORICAL   pantoprazole 40 mg DR tablet Take 1 tablet (40 mg total) by mouth daily.   Yes [provider]   polyethylene glycol powder Mix 1 capful (17 g) with 8 oz of water and take by mouth daily. 04/27/22  Yes Ulyses Amor., MD   prochlorperazine 10 mg tablet Take 1 tablet (10 mg total) by mouth three (3) times daily as needed for Nausea or Vomiting.   Yes PROVIDER, HISTORICAL       ALLERGIES:  Allergies   Allergen Reactions    Hydrocodone Other (See Comments)     ''burns a hole in stomach''  Tolerates hydromorphone    Ibuprofen Other (See Comments)     GI discomfort    ''hurts my stomach''    Morphine Itching     Tolerates hydromorphone       SOCIAL HISTORY:  Resides with family  - Tobacco:1-1.5 cigarettes/dday  - EtOH: denied  - Illicits: denied    PHYSICAL EXAM:  BP 122/82  ~ Pulse 55  ~ Temp 36.7 ?C (98.1 ?F) (Oral)  ~ Resp 20  ~ Ht 1.626 m (5' 4'')  ~ Wt 46.1 kg (101 lb 10.1 oz)  ~ SpO2 98%  ~ BMI 17.45 kg/m?        General: WDWN, alert, well-appearing, appears stated age, in NAD  HEENT: NCAT, PERRLA, EOMI, OP clear, neck supple, no lymphadenopathy. JVP flat  Heart: RRR, no m/r/g, peripheral pulses 2+, no edema  Lungs: Normal work of breathing. CTAB, no w/r/r.   Abdomen: Normoactive bowel sounds. Non-distended, mildly tender throughout all 4 abd quadrants. No hepatosplenomegaly appreciated.  Skin: No rashes or ulcers, normal skin turgor   Neuro: AOx3. Normal prosody of speech. CNII-XII intact. Gait assessment deferred. Strength and sensation grossly intact.  Psych: Normal mood/affect    LABS:  Labs were ordered and personally reviewed. Labs were compared to prior records obtained from chart and/or outside records when available.  Lab Results   Component Value Date    WBC 6.54 01/25/2023 HGB 11.3 (L) 01/25/2023    HCT 35.6 01/25/2023    PLT 442 (H) 01/25/2023     Lab Results   Component Value Date    NA 136 01/25/2023    K 3.8 01/25/2023    CL 104 01/25/2023    CO2 19 (L) 01/25/2023    BUN 11 01/25/2023    CREAT 1.38 (H) 01/25/2023    GLUCOSE 84 01/25/2023  CALCIUM 9.5 01/25/2023    MG 1.3 (L) 05/03/2022    PHOS 3.4 05/03/2022     Lab Results   Component Value Date    ALT 13 01/25/2023    AST 37 01/25/2023    BILITOT 0.4 01/25/2023    ALKPHOS 70 01/25/2023    ALBUMIN 4.6 01/25/2023     Lab Results   Component Value Date    APTT 27.3 04/08/2021    PT 12.6 11/24/2022    INR 1.0 11/24/2022     Lab Results   Component Value Date    CKTOT 115 04/09/2021    CKMB 1.9 12/09/2015    TROPONIN 5 (H) 06/17/2022    BNP 15 02/13/2022          MICROBIOLOGY:  Microbiology data was ordered and personally reviewed. Prior microbiology data was reviewed when available.      IMAGING/STUDIES:  Imaging was ordered and personally reviewed. Imaging was compared to prior imaging when available.    EKG: pending    IMAGING:  Date/Result:   CT abd+pelvis w contrast    Result Date: 01/25/2023  CT ABD+PELVIS W CONTRAST CLINICAL HISTORY: Abdominal pain. COMPARISON: CT 11/24/2022, 07/23/2022 TECHNIQUE: On a multirow-detector CT scanner, a volumetric contrast enhanced scan was performed through the abdomen and pelvis. CONTRAST: iodixanol (Visipaque) 320 mg/mL inj 100 mL RADIATION DOSE: The patient received the following exposure event(s) during this study, and the dose reference values for each are as shown (CTDIvol in mGy, DLP in mGy-cm). Note that the values are not patient dose but numbers generated from scan acquisition factors based on 32 cm (L) and/or 16 cm (S) phantoms and may substantially under-estimate or over-estimate actual patient dose based on patient size and other factors. PreMonitoring, CTDI(L): 0.8, DLP: 0.8;Monitoring, CTDI(L): 1.6, DLP: 1.6;DE_Abd/Pel, CTDI(L): 4.8, DLP: 188.6 FINDINGS: Lower chest: Stable subpleural scarring in bibasilar atelectasis. Coronary artery calcifications. Trace pericardial fluid. Liver: Unremarkable. Gallbladder and bile ducts: Surgically absent gallbladder. Stable mild-moderately dilated bile ducts similar to prior dated 11/24/2022, with common bile duct measuring up to 10 mm Spleen: Unremarkable. Pancreas: Grossly stable prominence of the pancreatic duct measuring up to 4 mm in the pancreatic head. Adrenals: Unremarkable. Kidneys and ureters: Unremarkable. Bowel: Postsurgical changes after partial gastrectomy and gastrojejunostomy. Nondilated small and large bowel. Scattered fluid within the jejunum. There is stable mild apparent wall thickening of the ascending and transverse colon without surrounding fat  stranding. Bladder: Unremarkable. Reproductive organs: Unremarkable. Lymph nodes: Unremarkable. Peritoneum: No free air, free fluid, or fluid collections. Vessels: Moderate atherosclerosis. Abdominal wall: Postsurgical changes in the anterior abdominal wall. Gluteal calcifications likely dystrophic Bones: Demineralized bones with degenerative changes of the spine most pronounced at L5-S1 where there is severe disc height loss and unchanged 5 mm anterolisthesis.     IMPRESSION: 1. No acute CT abnormality in the abdomen or pelvis. No significant interval change from prior CT. 2. Cholecystectomy with stable biliary dilatation which is likely physiologic for this patient given lack of interval change and normal cholestatic serum markers. If clinical concern for distal biliary obstruction MRCP may be considered. 3. Stable mild wall thickening of the cecum and proximal transverse colon likely exaggerated by under distention without surrounding fat stranding Signed by: Laural Benes   01/25/2023 9:55 AM      ASSESSMENT/PLAN:  Terina Mcelhinny is a 57 y.o. female with hx of anxiety, depression, emphysema, fibromyalgia, gastritis, GERD, colon surgery and pancreatitis who presents to the ED with complaint of abdominal pain s/p abdominal surgery  on 5/3. Pt had a cholecystectomy on 4/8, was treated for an infection, and admitted from 5/3-5/8 for ERCP and biliary stent placement who now presents with and is admitted with acute on chronic abdominal pain.     #Abdominal Pain, POA   #Diarrhea- POA, chronic  #Pancreatitis -   Patient recently underwent CT imaging without acute pathologic findings  - ADAT   - mIVF at 100 cc/hr   - Tylenol/Oxy/Dilaudid for pain   - C/S to Nutrition Services   - Zofran 4 mg q8h PRN    - Ova/Parasite Studies   - stool bile acid panel  - fecal elastase panel  - calprotectin panel  - bacterial enteropathogen panel   - parasitic enteropathogen panel  - Continue Protonix 40 mg qDay   - Continue Sucralfate 1 g TID   - continue home pancrelipase (creon ) 36,000 u TID with meals  - continue home cholestyramine 4g TID  [ ]  low threshold to consult GI if symptoms not improving with supportive care    #AKI  - Baseline creat likely 0.8-1.1; admission Creat 1.3  - suspect AKI in setting of poor PO intake/diarrhea  - mIVF to ensure appropriate fluid intake  - monitor BMP; avoid nephrotoxic agents     #Anxiety, POA   #Depression, POA - see treatment modalities below   #Fibromyalgia - Continue duloxetine 40 mg qday   #Insomnia, POA - Trazodone 100 mg at bedtime PRN   #Tobacco Dependence - Nicotine patch        #Inpatient Checklist:  Orders Placed This Encounter      Diet low fiber Outside food is ok      Oral nutrition supplements Breakfast, Lunch, Dinner    VTE Prophylaxis: enoxaparin  Central Lines: none  Tubes/Drains: none  Activity: out of bed TID with assist, inspiratory spirometer 10 times per hours while awake, and as tolerated  Precautions: No infectious isolation, standard, seizure, aspiration, and fall precautions.    #CODE: Prior  Primary Emergency Contact: [name],[name]    #DISPO: Anticipate post-hospitalization destination to be home when medically stable     Signed,  Ellis Savage, NP  01/25/2023 at 11:56 AM    CC:  Kelle Darting, MD  [address] / [place] [address]  Office: [phone]  Fax: [phone]

## 2023-01-25 NOTE — ED Provider Notes
[facility]  Emergency Department Service Report    Donna Gross 57 y.o. female , presents with Abdominal Pain      Triage   Arrived on 01/25/2023 at 6:33 AM   Arrived by Walk-in [14]    ED Triage Vitals   Temp Temp Source BP Heart Rate Resp SpO2 O2 Device Pain Score Weight   01/25/23 9678 01/25/23 9381 01/25/23 0175 01/25/23 1025 01/25/23 8527 01/25/23 0638 01/25/23 0638 01/25/23 0640 01/25/23 0639   36.4 ?C (97.5 ?F) Oral 108/76 95 18 98 % None (Room air) Ten 46.1 kg (101 lb 10.1 oz)       Pre hospital care:       Allergies   Allergen Reactions    Hydrocodone Other (See Comments)     ''burns a hole in stomach''  Tolerates hydromorphone    Ibuprofen Other (See Comments)     GI discomfort    ''hurts my stomach''    Morphine Itching     Tolerates hydromorphone       History   HPI     Patient is a 57 y.o. female with hx of GERD, pancreatitis, gastritis, intractable nausea and vomiting who presents to the ED with complaint of generalized abdominal pain x 6 days. Endorses diarrhea, nausea and vomiting, weight lost from 108 to 101lbs. Sx since gallbladder removal. Reports unable to keep food down. Endorses mild  mid-sternum chest pain. Took Zofran with no relief.  Denies fever, cough, SOB.          Financial risk analyst Used?: No                   Past Medical History:   Diagnosis Date    Anxiety     Depression     Emphysema, unspecified (HCC/RAF)     Emphysema, unspecified (HCC/RAF)     Fibromyalgia     Gastritis     GERD (gastroesophageal reflux disease)     History of blood transfusion     Intractable nausea and vomiting 01/23/2020    Pancreatitis         Past Surgical History:   Procedure Laterality Date    ABDOMINAL SURGERY      COLON SURGERY          Past Family History   family history includes Diabetes in her brother and father; Heart disease in her mother.                 Past Social History   she reports that she has been smoking cigarettes. She uses smokeless tobacco. She reports that she does not drink alcohol and does not use drugs. No history on file for sexual activity.       Physical Exam   Physical Exam  Vitals and nursing note reviewed.   Constitutional:       Appearance: Normal appearance.      Comments: Thin-appearing   HENT:      Head: Normocephalic and atraumatic.      Nose: Nose normal.      Mouth/Throat:      Mouth: Mucous membranes are moist.      Pharynx: Oropharynx is clear.   Eyes:      Extraocular Movements: Extraocular movements intact.      Conjunctiva/sclera: Conjunctivae normal.      Pupils: Pupils are equal, round, and reactive to light.   Cardiovascular:      Rate and Rhythm: Normal rate and regular rhythm.  Pulses: Normal pulses.      Heart sounds: Normal heart sounds.   Pulmonary:      Effort: Pulmonary effort is normal. No respiratory distress.      Breath sounds: Normal breath sounds.   Abdominal:      General: Abdomen is flat. Bowel sounds are normal.      Palpations: Abdomen is soft.      Tenderness: There is abdominal tenderness (diffuse).      Comments: Multiple scars that are well-healed   Musculoskeletal:         General: Normal range of motion.      Cervical back: Normal range of motion.      Right lower leg: No edema.      Left lower leg: No edema.   Skin:     General: Skin is warm and dry.      Capillary Refill: Capillary refill takes less than 2 seconds.   Neurological:      General: No focal deficit present.      Mental Status: She is alert and oriented to person, place, and time.   Psychiatric:         Mood and Affect: Mood normal.         Behavior: Behavior normal.         ED Course     ED Course as of 01/25/23 1217   Mon Jan 25, 2023   0981 Assessment: acute/chronic abdominal pain  DDx: viral infection, food borne illness, post op complication, retained gallstone, colitis  Plan: Labs, urine, CT scan   [KR]   1012 Creatinine(!): 1.38  Increased from baseline 1.  Likely consistent with dehydration/p.o. intolerance.  Other labs within normal limits. [SK]   1013 CT without significant change from 1 month prior.  No acute process identified. [SK]   1047 Given the persistent nausea and vomiting continued abdominal pain without a clear diagnosis, and significant weight loss, I will admit the patient for further treatment and management. [SK]   1122 Consulted Dr. Delane Ginger, Hospitalist who will admit patient to their service for further workup [KR]      ED Course User Index  [KR] Rudio, Kristina  [SK] Sydnee Cabal., MD, MPH       Laboratory Results     Labs Reviewed   COMPREHENSIVE METABOLIC PANEL - Abnormal; Notable for the following components:       Result Value    Total CO2 19 (*)     Creatinine 1.38 (*)     All other components within normal limits   CBC - Abnormal; Notable for the following components:    Hemoglobin 11.3 (*)     Mean Corpuscular Hemoglobin 26.0 (*)     Red Cell Distribution Width-SD 55.9 (*)     Red Cell Distribution Width-CV 18.6 (*)     Platelet Count, Auto 442 (*)     Mean Platelet Volume 8.6 (*)     All other components within normal limits   LIPASE - Normal   PREGNANCY TEST,BLOOD - Normal   BACT ENTERIC PATHOGEN PANEL PCR   URINALYSIS W/REFLEX TO CULTURE    Narrative:     The following orders were created for panel order Urinalysis w/Reflex to Culture.  Procedure                               Abnormality         Status                     ---------                               -----------         ------  UA,Dipstick[[id number]]                                                                 UA,Microscopic[[id number]]                                                                Please view results for these tests on the individual orders.   UA,DIPSTICK   UA,MICROSCOPIC       Imaging Results     CT abd+pelvis w contrast   Final Result by San Morelle., MD [phone])   IMPRESSION:      1. No acute CT abnormality in the abdomen or pelvis. No significant interval change from prior CT.      2. Cholecystectomy with stable biliary dilatation which is likely physiologic for this patient given lack of interval change and normal cholestatic serum markers. If clinical concern for distal biliary obstruction MRCP may be considered.      3. Stable mild wall thickening of the cecum and proximal transverse colon likely exaggerated by under distention without surrounding fat stranding               Signed by: Laural Benes   01/25/2023 9:55 AM          Administered Medications     Medication Administration from 01/25/2023 0634 to 01/25/2023 1215         Date/Time Order Dose Route Action Action by Comments     01/25/2023 0815 PST sodium chloride 0.9% IV soln bolus 1,000 mL 0 mL Intravenous Stopped Sherlon Handing, RN --     01/25/2023 [phone] PST sodium chloride 0.9% IV soln bolus 1,000 mL 1,000 mL Intravenous New Bag/ Syringe/ Cartridge Tylene Fantasia, RN --     01/25/2023 6606 PST ondansetron 4 mg/2 mL inj 4 mg 4 mg Intravenous Given Tylene Fantasia, RN --     01/25/2023 [phone] PST HYDROmorphone 1 mg/mL inj 0.5 mg 0.5 mg IV Push Given Tylene Fantasia, RN --     01/25/2023 0109 PST iodixanol (Visipaque) 320 mg/mL inj 100 mL 85 mL Intravenous Given Mardirosian, Ani 1.6cc/sec     01/25/2023 1026 PST HYDROmorphone 1 mg/mL inj 0.5 mg 0.5 mg IV Push Given Sherlon Handing, RN --            Procedures   Procedural Sedation  Procedures    Medical Decision Making   Zyaire Dumas is a 57 y.o. female with hx of GERD, pancreatitis, gastritis, intractable nausea and vomiting who presents to the ED with complaint of generalized abdominal pain x 6 days.     Medical Decision Making  To evaluate for electrolyte abnormalities and renal function, I ordered a chemistry panel.  To evaluate for leukocytosis or anemia, I ordered a CBC.  To evaluate for appendicitis, colitis, SBO or other acute process in the abdomen, I ordered a CT scan of the abdomen and pelvis.  To evaluate for urine infection or signs of nephrolithiasis, I ordered a urinalysis. To treat dehydration, I ordered IV fluids. To treat nausea and  vomiting, I ordered Zofran. To treat acute pain, I ordered dilaudid.     Problems Addressed:  Abdominal pain: acute illness or injury  AKI (acute kidney injury) (HCC/RAF): acute illness or injury  Dehydration: acute illness or injury  Nausea and vomiting, unspecified vomiting type: acute illness or injury    Amount and/or Complexity of Data Reviewed  Labs: ordered. Decision-making details documented in ED Course.  Radiology: ordered and independent interpretation performed. Decision-making details documented in ED Course.  ECG/medicine tests: ordered. Decision-making details documented in ED Course.    Risk  Prescription drug management.  Parenteral controlled substances.  Drug therapy requiring intensive monitoring for toxicity.  Decision regarding hospitalization.      Clinical Impression     1. Abdominal pain    2. AKI (acute kidney injury) (HCC/RAF)    3. Dehydration    4. Nausea and vomiting, unspecified vomiting type          Prescriptions     New Prescriptions    No medications on file       Disposition and Follow-up   Disposition: Admit [3]    No future appointments.    Follow up with:  No follow-up provider specified.    Return precautions are specified on After Visit Summary.        Scribe Signature   I, Ladona Horns, have acted as a Stage manager for patient Joceline Hinchcliff on behalf of Dr. Meda Coffee at 01/25/2023 at 6:54 AM. All documentation underwent a comprehensive review by the listed physician(s) and received their approval upon signing.      I have reviewed this note as recorded by KR, who acted as medical scribe, and I attest that it is an accurate representation of my H&P and other events of the ED visit except as otherwise noted.         Sydnee Cabal., MD, MPH  01/25/23 [phone]

## 2023-01-25 NOTE — ED Notes
Report given to Maria, RN.

## 2023-01-26 DIAGNOSIS — R197 Diarrhea, unspecified: Secondary | ICD-10-CM

## 2023-01-26 DIAGNOSIS — R109 Unspecified abdominal pain: Secondary | ICD-10-CM

## 2023-01-26 DIAGNOSIS — R634 Abnormal weight loss: Secondary | ICD-10-CM

## 2023-01-26 LAB — CBC
MEAN CORPUSCULAR HEMOGLOBIN: 26.5 pg (ref 26.4–33.4)
NUCLEATED RBC%, AUTOMATED: 0 (ref 0.00–0.00)

## 2023-01-26 LAB — Basic Metabolic Panel: UREA NITROGEN: 9 mg/dL (ref 7–22)

## 2023-01-26 LAB — Phosphorus: PHOSPHORUS: 3.3 mg/dL (ref 2.3–4.4)

## 2023-01-26 LAB — Magnesium: MAGNESIUM: 1.2 meq/L — ABNORMAL LOW (ref 1.4–1.9)

## 2023-01-26 LAB — Hepatic Funct Panel: ASPARTATE AMINOTRANSFERASE: 20 U/L (ref 13–62)

## 2023-01-26 MED ADMIN — HYDROMORPHONE HCL 1 MG/ML IJ SOLN: .5 mg | INTRAVENOUS | @ 06:00:00 | Stop: 2023-01-27 | NDC 00409128331

## 2023-01-26 MED ADMIN — DEXTROSE 5 %/LACTATED RINGERS: 100 mL/h | INTRAVENOUS | @ 14:00:00 | Stop: 2023-01-30 | NDC 00338012504

## 2023-01-26 MED ADMIN — NICOTINE 14 MG/24HR TD PT24: 14 mg | TRANSDERMAL | @ 17:00:00 | Stop: 2023-02-04 | NDC 00536589588

## 2023-01-26 MED ADMIN — HYDROMORPHONE HCL 1 MG/ML IJ SOLN: .5 mg | INTRAVENOUS | @ 16:00:00 | Stop: 2023-01-27 | NDC 00409128331

## 2023-01-26 MED ADMIN — PANTOPRAZOLE SODIUM 40 MG PO TBEC: 40 mg | ORAL | @ 17:00:00 | Stop: 2023-01-27 | NDC 60687073609

## 2023-01-26 MED ADMIN — TRAZODONE HCL 100 MG PO TABS: 100 mg | ORAL | @ 05:00:00 | Stop: 2023-02-24 | NDC 60687045411

## 2023-01-26 MED ADMIN — DULOXETINE HCL 30 MG PO CPEP: 30 mg | ORAL | @ 17:00:00 | Stop: 2023-02-24 | NDC 60687073411

## 2023-01-26 MED ADMIN — ZZ IMS TEMPLATE: 150 mg | ORAL | @ 18:00:00 | Stop: 2023-02-04 | NDC 60687034911

## 2023-01-26 MED ADMIN — OXYCODONE HCL 5 MG PO TABS: 15 mg | ORAL | @ 02:00:00 | Stop: 2023-01-27 | NDC 68084035411

## 2023-01-26 MED ADMIN — OXYCODONE HCL 5 MG PO TABS: 15 mg | ORAL | @ 19:00:00 | Stop: 2023-01-27 | NDC 68084035411

## 2023-01-26 MED ADMIN — VITAMIN B-12 500 MCG PO TABS: 1000 ug | ORAL | @ 17:00:00 | Stop: 2023-02-04 | NDC 50268085415

## 2023-01-26 MED ADMIN — OXYCODONE HCL 5 MG PO TABS: 15 mg | ORAL | @ 11:00:00 | Stop: 2023-01-27 | NDC 68084035411

## 2023-01-26 MED ADMIN — VITAMIN D3 25 MCG (1000 UT) PO TABS: 25 ug | ORAL | @ 17:00:00 | Stop: 2023-02-05

## 2023-01-26 MED ADMIN — HYDROMORPHONE HCL 1 MG/ML IJ SOLN: .5 mg | INTRAVENOUS | @ 01:00:00 | Stop: 2023-01-27 | NDC 00409128331

## 2023-01-26 MED ADMIN — CHOLESTYRAMINE 4 G PO PACK: 4 g | ORAL | @ 14:00:00 | Stop: 2023-02-04 | NDC 27241013436

## 2023-01-26 MED ADMIN — FLUTICASONE PROPIONATE HFA 220 MCG/ACT IN AERO: 1 | RESPIRATORY_TRACT | @ 17:00:00 | Stop: 2023-02-06

## 2023-01-26 MED ADMIN — ENOXAPARIN SODIUM 40 MG/0.4ML IJ SOSY: 40 mg | SUBCUTANEOUS | @ 23:00:00 | Stop: 2023-01-28 | NDC 71288043382

## 2023-01-26 MED ADMIN — ACETAMINOPHEN 500 MG PO TABS: 1000 mg | ORAL | @ 22:00:00 | Stop: 2023-01-27

## 2023-01-26 MED ADMIN — CHOLESTYRAMINE 4 G PO PACK: 4 g | ORAL | @ 05:00:00 | Stop: 2023-02-06 | NDC 68382052860

## 2023-01-26 MED ADMIN — DULOXETINE HCL 30 MG PO CPEP: 30 mg | ORAL | @ 01:00:00 | Stop: 2023-02-24 | NDC 60687073411

## 2023-01-26 MED ADMIN — ONDANSETRON HCL 4 MG/2ML IJ SOLN: 4 mg | INTRAVENOUS | @ 02:00:00 | Stop: 2023-01-28 | NDC 60505613000

## 2023-01-26 MED ADMIN — PANCRELIPASE (LIP-PROT-AMYL) 36000-114000 UNITS PO CPEP: 36000 [IU] | ORAL | @ 02:00:00 | Stop: 2023-02-25 | NDC 00032301613

## 2023-01-26 MED ADMIN — PANCRELIPASE (LIP-PROT-AMYL) 36000-114000 UNITS PO CPEP: 36000 [IU] | ORAL | @ 17:00:00 | Stop: 2023-02-04 | NDC 00032301613

## 2023-01-26 MED ADMIN — HYDROMORPHONE HCL 1 MG/ML IJ SOLN: .5 mg | INTRAVENOUS | @ 23:00:00 | Stop: 2023-01-27 | NDC 00409128331

## 2023-01-26 NOTE — ED Notes
Report given to Donna Gross

## 2023-01-26 NOTE — ED Notes
Report received from [place], [place] all questions answered care assumed

## 2023-01-26 NOTE — ED Notes
Report given to Jorge RN

## 2023-01-26 NOTE — Consults
[facility] DEPARTMENT OF MEDICINE  DIVISION OF GASTROENTEROLOGY  GENERAL GI CONSULT NOTE    PATIENT: Donna Gross  MRN: [id number]  DOB: 06-Mar-1966    ADMISSION DATE: 01/25/2023    HOSPITAL DAY: 1  DATE OF SERVICE: 01/26/2023  PRIMARY TEAM ATTENDING: Donnajean Lopes., MD  REASON FOR CONSULT: Abdominal pain and diarrhea    HPI/SUBJECTIVE  Donna Gross is a 57 y.o. female with GERD, pancreatitis, anxiety/depression, fibromyalgia, perforated gastric ulcer s/p Billroth 2 surgery (2016), partial colectomy for colonic perforation (2015) with colostomy s/p reversal, gallstone pancreatitis s/p cholecystectomy (03/27/2022) with c/f post-operative bile leak s/p ERCP (04/22/22) with empiric plastic biliary stent placement and removal (06/2022) who presented with abdominal pain, nausea, poor PO intake and diarrhea for the past year.     She reports since her cholecystectomy 03/2022, she has had constant diffuse abdominal pain (primarily epigastric) that is worse with eating and improves with small movements, no change with bowel movements. Has post-prandial nausea/vomiting immediately after eating almost anything, denies dysphagia, does have heartburn due to recurrent emesis and sometimes with pink streaks to emesis. Takes TUMS sometimes which helps slightly. Will vomit about 3 times daily. Also will have watery bowel movements 3-15 minutes after eating anything. Will usually start with a large volume watery stool, may need to go back again 5 minutes later with smaller bowel movements. Sometimes has urgency and doesn't have a bowel movement at all. Will usually have 5-7 bowel movements daily, but sometimes as many as 13-14, sometimes nocturnal diarrhea. With rectal pain associated with BM and sometimes streaks of blood in stool; one time had red blood on toilet paper. Reported last colonoscopy ~3 years ago at [facility] with Dr. Lazarus Salines, doesn't remember the results. With associated 50-lb weight loss since her surgery (154 -> 101 lbs). Takes zofran once in the morning, but has never tried pre-prandial prophylactic zofran. Also takes Percocet up to 4 times daily for pain. Denies NSAID use, not interested in acupuncture. Reports taking Creon 36K before meals and cholestyramine 4g TID. Denies having any abdominal pain or opioid use prior to cholecystectomy 03/2022.     ED presentation 05/2022, 07/2022, 11/2022 for ongoing symptoms.       (click to expand/collapse)   PMH  Past Medical History:   Diagnosis Date    Anxiety     Depression     Emphysema, unspecified (HCC/RAF)     Emphysema, unspecified (HCC/RAF)     Fibromyalgia     Gastritis     GERD (gastroesophageal reflux disease)     History of blood transfusion     Intractable nausea and vomiting 01/23/2020    Pancreatitis        PSH  Past Surgical History:   Procedure Laterality Date    ABDOMINAL SURGERY      COLON SURGERY         MEDS  Home medications  Current Outpatient Medications   Medication Instructions    acetaminophen (ACETAMINOPHEN) 1,000 mg, Oral, Every 6 hours PRN    albuterol 90 mcg/act inhaler 2 puffs, Inhalation, Every 6 hours PRN    B Complex Vitamins (VITAMIN B COMPLEX) capsule 1 capsule, Oral, Daily    cholestyramine (QUESTRAN) 4 g, Oral, 3 times daily with meals    clobetasol 0.05% cream Topical, 3 times daily PRN    cyanocobalamin (CYANOCOBALAMIN) 1,000 mcg, Oral, Daily    DULoxetine (CYMBALTA) 30 mg, Oral, Daily    fluticasone-salmeterol 250-50 mcg/act diskus inhaler 1 puff, Inhalation, 2 times daily  ipratropium-albuterol 20-100 mcg/act inhaler 1 puff, Inhalation, Every 6 hours PRN    ondansetron (ZOFRAN) 4 mg, Oral, Every 6 hours PRN    ondansetron (ZOFRAN) 8 mg, Oral, Daily PRN    oxyCODONE-acetaminophen (PERCOCET) 7.5-325 MG per tablet 1 tablet, Oral, Every 6 hours PRN    pancrelipase, Lip-Prot-Amyl, (CREON) 36000 units DR capsule 36,000 units of lipase, Oral, 3 times daily with meals    pantoprazole (PROTONIX) 40 mg, Oral, Daily    QUEtiapine Fumarate (SEROQUEL PO) 150 mg, Oral, 2 times daily    Vitamin D3 50 mcg, Oral, Daily       Scheduled:  acetaminophen, 1,000 mg, Oral, TID  acetaminophen, 1,000 mg, Oral, TID  cholestyramine, 4 g, Oral, TID  cyanocobalamin, 1,000 mcg, Oral, Daily  DULoxetine, 30 mg, Oral, Daily  enoxaparin, 40 mg, Subcutaneous, Q24H  fluticasone, 1 puff, Inhalation, Daily  lidocaine, 1 patch, Transdermal, Q24H  nicotine, 1 patch, Transdermal, Daily  nitroglycerin, 1 inch, Rectal, Q12H  [START ON 01/27/2023] ondansetron injection/IVPB, 4 mg, Intravenous, TID w/meals  pancrelipase (Lip-Prot-Amyl), 36,000 units of lipase, Oral, TID w/meals  pantoprazole, 40 mg, IV Push, Q12H  QUEtiapine, 150 mg, Oral, BID  vitamin B complex, 1 capsule, Oral, Daily  vitamin D (cholecalciferol), 25 mcg, Oral, Daily    PRN:  albuterol, aluminum-magnesium hydroxide-simethicone, HYDROmorphone, lidocaine, ondansetron **OR** ondansetron injection/IVPB, oxyCODONE **OR** oxyCODONE, traZODone    Infusions:   dextrose 5%/lactated ringers 100 mL/hr (01/26/23 1719)       ALLERGIES  Hydrocodone, Ibuprofen, and Morphine    FMH  Family History   Problem Relation Age of Onset    Heart disease Mother     Diabetes Brother     Diabetes Father     Anesthesia problems Neg Hx     Malignant hypertension Neg Hx     Hypotension Neg Hx     Malignant hyperthermia Neg Hx     Pseudochol deficiency Neg Hx        SOCIAL HISTORY  Social History     Tobacco Use    Smoking status: Every Day     Years: 20     Types: Cigarettes    Smokeless tobacco: Current    Tobacco comments:     1 cig per day   Substance Use Topics    Alcohol use: No    Drug use: No       PHYSICAL EXAM    Temp:  [36.2 ?C (97.2 ?F)-37.1 ?C (98.8 ?F)] 37.1 ?C (98.8 ?F)  Heart Rate:  [65-98] 96  Resp:  [11-20] 18  BP: (76-112)/(56-70) 93/70  NBP Mean:  [16-78] 78  SpO2:  [95 %-96 %] 96 %  BMI Readings from Last 1 Encounters:   01/26/23 17.34 kg/m?     Wt Readings from Last 10 Encounters:   01/26/23 1825 45.8 kg (101 lb)   01/25/23 0639 46.1 kg (101 lb 10.1 oz)   11/24/22 0600 49.2 kg (108 lb 7.5 oz)   07/23/22 0629 52.2 kg (115 lb)   07/01/22 1332 56.5 kg (124 lb 9 oz)   05/18/22 0734 57 kg (125 lb 10.6 oz)   05/07/22 0503 54.4 kg (120 lb 0.3 oz)   05/06/22 0423 54.5 kg (120 lb 4 oz)   05/02/22 0823 54.4 kg (120 lb)   04/21/22 2237 59.6 kg (131 lb 6.3 oz)   03/27/22 1055 64.4 kg (141 lb 15.6 oz)   02/13/22 2300 64.1 kg (141 lb 6.4 oz)   02/13/22 0846 67.6 kg (149  lb)   01/30/22 1210 68.9 kg (151 lb 12.8 oz)       System Check if examined and normal Positive or additional negative findings   Constit  [x]  General appearance - normal  Thin, in no acute distress   Eyes  [x]  Conjuctivae clear       HENMT  [x]  Normal Lips/teeth/gums, oropharynx, head     Neck  [x]  Inspection; no masses palpated     Resp  [x]  Effort good. No labored breathing     CV  []  Normal rhythm/rate   []  Normal pulses     Abdomen  []  Soft, non-tender, non-distended, no rebound guarding    []  Active bowel sounds 4 quadrants   []  No appreciable flank or shifting dullness   []  No hepatosplenomegaly   []  No umbilical hernia Soft, diffusely tender to light palpation with voluntary guarding, no rebound, increased pain over central vertical abdominal scar, well healed abdominal surgical scars   Rectal   []  No external hemorrhoids or fissure   []  No masses palpated on DRE   []  Appropriate resting pressure/sphincter tone   []  Appropriate squeeze pressure   []  Appropriate bear down maneuver   []  Chaperone present for exam     MSK  [x]  Grossly normal strength, ROM     Neuro   [x]  Grossly normal      Psychiatric  []   Oriented to time, place and person   [x]   Appropriate insight/judgement & mood/affect           LABS/STUDIES    Labs Independently Interpreted by me today  Lab Results   Component Value Date    WBC 6.47 01/26/2023    HGB 9.7 (L) 01/26/2023    HCT 30.6 (L) 01/26/2023    MCV 84.3 01/26/2023    PLT 312 01/26/2023       Imaging Tests Independently Interpreted by me today   CTAP w/ contrast 01/25/23  1. No acute CT abnormality in the abdomen or pelvis. No significant interval change from prior CT.  2. Cholecystectomy with stable biliary dilatation which is likely physiologic for this patient given lack of interval change and normal cholestatic serum markers. If clinical concern for distal biliary obstruction MRCP may be considered.  3. Stable mild wall thickening of the cecum and proximal transverse colon likely exaggerated by under distention without surrounding fat stranding    Procedures  ERCP 07/01/22 - Dr. Selena Batten - bile leak  1. No bile leak seen.   2. Removal of the previously placed plastic biliary stent.  3. Removal of biliary sludge and small stones from the CBD.  4. Anatomy consistent with Billroth 2.    ERCP 04/22/22 - Dr. Selena Batten - bile leak  DIAGNOSIS:  1. No obvious bile leak seen. But given clinical concern for bile leak following recent cholecystectomy, a 7 Fr biliary stent was placed.  2. Anatomy consistent with Billroth 2.     RECOMMENDATIONS:  1. Watch for complications including bleeding, perforation, and pancreatitis.  2. Consider cross sectional imaging to evaluate for any residual fluid collection from suspected bile leak.  3. Repeat ERCP 2-3 months for stent removal/exchange.    ERCP 04/08/21 - Dr. Chelsea Aus - choledocholithiasis  1. Successful ERCP in patient with Billroth II anatomy.  2. Choledocholithiasis, s/p sphincterotomy and balloon sweeps with duct clearance as above.       ASSESSMENT AND PLAN  Donna Gross is a 57 y.o. female with GERD, pancreatitis, anxiety/depression, fibromyalgia, perforated gastric  ulcer s/p Billroth 2 surgery (2016), partial colectomy for colonic perforation (2015) with colostomy s/p reversal, gallstone pancreatitis s/p cholecystectomy (03/27/2022) with c/f post-operative bile leak s/p ERCP (04/22/22) with empiric plastic biliary stent placement and removal (06/2022) who presented with abdominal pain, nausea, poor PO intake and diarrhea for the past year. #Nausea/vomiting  #Diffuse abdominal pain  #Diarrhea  #C/f anal fissure  Since her cholecystectomy 03/2022, she complains of constant abdominal pain, worsened with PO intake, c/b post-prandial nausea/vomiting (3 times daily) and diarrhea (5-15 times daily, also with nocturnal symptoms) and rectal pain with intermittent rectal bleeding c/f fissure. Symptoms may be due to esophagitis/gastritis, query partial obstruction due to multiple abdominal surgeries c/b adhesions (never had UGIS, but no obstruction noted on CTAP). Definitely has some component of abdominal wall pain with pain overlying central surgical scar. Query some dysmotility due to opioid use, though recognize that her opioid requirement only began after post-operative symptom onset. Lower c/f symptoms due to chronic pancreatitis due to relatively normal appearance of pancreas on imaging. Query microscopic colitis iso SNRI use versus bile acid diarrhea, though doesn't notice improvement of diarrhea with cholestyramine use.       Recommendations  - Inpatient EGD + colonoscopy Thursday 01/27/23 (prep as below)   - Clear liquid diet 01/26/23  - Empirically start IV pantoprazole 40mg  BID for esophagitis/gastritis tx  - Scheduled IV zofran TID before meals  - [facility] consultation for pain  - Lidocaine patch to central abdominal scar  - Continue Creon 36K with meals   - Will consider increasing dose  - Agree with fecal fat, pancreatic elastase, calprotectin, bacterial/parasite panel, fecal bile acid   - Also check H pylori stool Ag  - Consider weaning opioids as able, though patient was very upset and not amenable to this plan  - Start PRN topical lidocaine daily for rectal pain  - Start 0.4% nitroglycerin rectal ointment BID for possible anal fissure    - Colonoscopy prep (may use ''Colonoscopy Prep IP'' order set):   - Begin at 4PM with 4L GoLytely over 4 hours max and give bisacodyl 5 mg PO   - Suggest or 1 cup every 10-24min or faster. The rate is important   - If not clear at 8 PM (please have night float check) (i.e. no more yellow than ginger ale and without any particle/sediment or solid pieces), please give additional 2L Golytely over 2 hours max along with additional bisacodyl 5mg  PO   - Clear liquid diet (no reds) day before  - NPO after midnight, patient can continue to drink prep until 4 hours prior to procedure  - May require IV Reglan 10mg  periodically to tolerate prep       Code Status:  Full Code    Discussed with attending Dr. Carmie Kanner. We will continue to follow.     I reviewed these specialist notes that directly related to the patient's acute and/or chronic medical problems with documentation of the salient findings, if any: hospitalist    I have spoken with the primary team regarding the patient's care today. Our conclusions were as above.      AuthorDorian Heckle Chittajallu 01/26/2023 8:13 PM  GI Fellow PGY-4, pager [phone]  If after 5pm or on weekends, please page GI fellow on call [phone]    The patient was seen and examined with the fellow.  The case was discussed with the fellow, and I agree with the findings and plan of care we formulated  together as documented in the fellow?s note.

## 2023-01-26 NOTE — Consults
NUTRITION ASSESSMENT (Adult)    Admit Date: 01/25/2023     Date of Birth: 09-16-1966 Gender: female MRN: [id number]     Date of Assessment: 01/26/2023    Consult and Assessment   Indication: BMI < or equal to 18.5   Subjective: Pt seen in the ED awake and alert. Pt reports poor PO intake due to abdominal pain. States pain makes it difficult to eat meals. At home, pt was only able to consume 1 meal a day. Using ONS one to two times daily. Diarrhea reported. Per pt, she has been having diarrhea for several months. NFPE performed revealing moderate to severe muscle wasting and moderate fat wasting.    Problems: Principal Problem:    Abdominal pain (POA: Yes)       Past Medical History:   Diagnosis Date    Anxiety     Depression     Emphysema, unspecified (HCC/RAF)     Emphysema, unspecified (HCC/RAF)     Fibromyalgia     Gastritis     GERD (gastroesophageal reflux disease)     History of blood transfusion     Intractable nausea and vomiting 01/23/2020    Pancreatitis     Past Surgical History:   Procedure Laterality Date    ABDOMINAL SURGERY      COLON SURGERY            Per H&P/MD notes: Donna Gross is a 57 y.o. female with hx of anxiety, depression, emphysema, fibromyalgia, gastritis, GERD, colon surgery and pancreatitis who presents to the ED with complaint of abdominal pain with associated nausea, poor PO intake and diarrhea x6 days.        Data   Intake/Outputs: No intake/output data recorded.     Pertinent Medications: cholestyramine, 4 g, Oral, TID  cyanocobalamin, 1,000 mcg, Oral, Daily  DULoxetine, 30 mg, Oral, Daily  enoxaparin, 40 mg, Subcutaneous, Q24H  fluticasone, 1 puff, Inhalation, Daily  nicotine, 1 patch, Transdermal, Daily  pancrelipase (Lip-Prot-Amyl), 36,000 units of lipase, Oral, TID w/meals  pantoprazole, 40 mg, Oral, Daily  QUEtiapine, 150 mg, Oral, BID  vitamin B complex, 1 capsule, Oral, Daily  vitamin D (cholecalciferol), 25 mcg, Oral, Daily    FDI Target Drugs: No      Pertinent Labs:   Recent Labs     01/26/23  0637 01/25/23  0707   NA 137 136   K 3.7 3.8   BUN 9 11   CREAT 1.06 1.38*   GLUCOSE 129* 84   MG 1.2*  --    CALCIUM 8.7 9.5   PHOS 3.3  --    ALBUMIN 3.7* 4.6   WBC 6.25 6.54   HGB 9.9* 11.3*   HCT 31.6* 35.6   BILITOT 0.2 0.4   AST 20 37   ALT 10 13   ALKPHOS 62 70   LIPASE  --  32       Trended Labs     04/22/22  0852   CRP 0.5        No results for input(s): ''VITD25OH'', ''PTHINT'', ''VITAMINA'', ''VITAMINE'', ''VITAMINC'', ''VITAMINK'', ''VITAMINB1'', ''VITAMINB2'', ''VITAMINB3'', ''VITAMINB6'', ''VITAMINB12'', ''MEMALSER'', ''FOLATE'', ''FOLATERBC'', ''FE'', ''TIBC'', ''FEBINDSAT'', ''FERRITIN'', ''SELENIUM'', ''CUSER'', ''CURBC'', ''ZINCSER'' in the last 8760 hours.    Accu-Chek: No results found in last 72 hours    Respiratory Status / O2 Device: None (Room air)    Pressure Injury: none recorded    Additional data:  N/A     Diet Info   Allergies:   Hydrocodone, Ibuprofen,  and Morphine  Cultural/Ethnic/Religious/Other Food Preferences:  None     Nutrition prior to admit:  Poor intake, one meal a day, using ONS 1-2 times per day  Current diet order:     Diets/Supplements/Feeds   Diet    Diet low fiber Outside food is ok     Start Date/Time: 01/25/23 1220      Number of Occurrences: Until Specified     Order Questions:      Select if outside food is ok: Outside food is ok   Nourishments    Oral nutrition supplements Breakfast, Lunch, Dinner     Start Date/Time: 01/25/23 1220      Number of Occurrences: Until Specified     Order Questions:      Meal: Breakfast      Meal: Lunch      Meal: Dinner     PO % consumed:  (New pt, no meals documented yet)  Parenteral/Enteral Nutrition: none     Anthropometrics:   Height: 162.6 cm (5' 4'')  Admit Weight: 46.1 kg (101 lb 10.1 oz) (01/25/23 2130)  Current Weight: 45.8 kg (101 lb)  BMI: 17.34  IBW: 54 kg/120 lbs  IBW %: 84  Adj BW (if applicable): 52 kg  UBW:  (unable to determine, weight loss x 1 year)  UBW %:         Weight History (last 5)  Weights Weight   05/18/2022   7:34 AM 57 kg 07/01/2022   1:32 PM 56.5 kg   07/23/2022   6:29 AM 52.2 kg   11/24/2022   6:00 AM 49.2 kg   01/25/2023   6:39 AM 46.1 kg       Estimated Nutrition Needs   Based on wt: 46.1 kg  Calories: 35 - 40 cal/kg = 1613.5 - 1844 cal/day  Protein: 1.5 - 2 g pro/kg = 69.15 - 92.2 g pro/day      Diet Education   No diet education needs at this time         Malnutrition Assessment using AND/ASPEN Consensus Criteria   Malnutrition in the Context of: Severe inflammation, chronic illness    Energy Intake: < 75% of estimated energy requirement for >7 days; Supportive data: Pt reports poor intake, eating only 1 meal per day for more than one week   Weight Loss: > 7.5% in 3 months; Supportive data: 50lbs (33.1%) weight loss in 1 year as above      Nutrition-Focused Physical Exam: 01/26/2023  Subcutaneous Fat Loss: Moderate  Areas examined/observed: Orbital Upper, Arm  Muscle Loss: Severe  Areas examined/observed: Temple, Clavicle, Interosseous, Anterior Thigh, Patellar Region       Patient meets criteria for: Severe protein calorie malnutrition     Nutrition Assessment    Anthropometrics: Underweight BMI classification. Pt has been experiencing ongoing weight loss for 1 year.     Nutrition: Poor intake reported. Pt reports eating one meal a day due to abdominal pain. Will drink 1 to 2 oral nutrition supplements to receive additional nutrition. Pain preventing pt to tolerate all foods. No foods seem to be tolerable at this time. Hx of pancreatic lipase     Tolerance/GI: Abdominal pain reported. Pt also reports diarrhea and states that it has been ongoing for several months. Per H&P, pt presents with diarrhea for six days.     Labs: Low magnesium. Needs repletion. Glucose trending up, no hx of DM. Lipase within normal limits.     Micronutrients: No recent labs yet.  Will request new labs. Currently on Vit B12 and D.     Skin: Intact     Recommendations / Care Plan      Continue with low fiber diet and cater to pt's preferences   Continue with current vitamin and mineral supplementation    -cyanocobalamin 1,48mcg PO daily   - vit B12 complex tab PO daily    - cholecalciferol PO daily   Will send Ensure Enlive TID to better meet estimated needs, if diarrhea worsens will offer Boost Breeze once back in house   Check Vit B1, Vit B12, and Vitamin D  Check triglyceride levels   Monitor Mg levels and replete as needed  Follow up on pancreatic work up, consider GI consult and or increasing pancreatic enzyme dosing if diarrhea continues       Author:    01/26/2023 11:14 AM     Provided recs to MD via secure chat. Nutrition will continue to follow per policy and as needed throughout hospital stay.

## 2023-01-27 LAB — Prothrombin Time Panel: PROTHROMBIN TIME: 13.3 s (ref 11.5–14.4)

## 2023-01-27 LAB — Basic Metabolic Panel
CREATININE: 0.97 mg/dL (ref 0.60–1.30)
POTASSIUM: 4.3 mmol/L (ref 3.6–5.3)

## 2023-01-27 LAB — CBC: HEMOGLOBIN: 9.8 g/dL — ABNORMAL LOW (ref 11.6–15.2)

## 2023-01-27 MED ADMIN — NITROGLYCERIN 0.4 % RE OINT: 1 [in_us] | RECTAL | @ 07:00:00 | Stop: 2023-02-26

## 2023-01-27 MED ADMIN — NICOTINE 14 MG/24HR TD PT24: 14 mg | TRANSDERMAL | @ 17:00:00 | Stop: 2023-02-24 | NDC 00536589588

## 2023-01-27 MED ADMIN — DEXTROSE 5 %/LACTATED RINGERS: 100 mL/h | INTRAVENOUS | @ 17:00:00 | Stop: 2023-01-30 | NDC 00338012504

## 2023-01-27 MED ADMIN — CHOLESTYRAMINE 4 G PO PACK: 4 g | ORAL | Stop: 2023-02-02 | NDC 27241013436

## 2023-01-27 MED ADMIN — ONDANSETRON HCL 4 MG/2ML IJ SOLN: 4 mg | INTRAVENOUS | @ 02:00:00 | Stop: 2023-01-28 | NDC 60505613000

## 2023-01-27 MED ADMIN — DEXTROSE 5 %/LACTATED RINGERS: 100 mL/h | INTRAVENOUS | @ 07:00:00 | Stop: 2023-02-24 | NDC 00338012504

## 2023-01-27 MED ADMIN — CHOLESTYRAMINE 4 G PO PACK: 4 g | ORAL | @ 21:00:00 | Stop: 2023-02-04 | NDC 68382052860

## 2023-01-27 MED ADMIN — OXYCODONE HCL 5 MG PO TABS: 10 mg | ORAL | @ 02:00:00 | Stop: 2023-01-29 | NDC 68084035411

## 2023-01-27 MED ADMIN — OXYCODONE HCL 5 MG PO TABS: 10 mg | ORAL | @ 13:00:00 | Stop: 2023-01-29 | NDC 68084035411

## 2023-01-27 MED ADMIN — TRAZODONE HCL 100 MG PO TABS: 100 mg | ORAL | @ 05:00:00 | Stop: 2023-02-04 | NDC 60687045411

## 2023-01-27 MED ADMIN — ACETAMINOPHEN 500 MG PO TABS: 1000 mg | ORAL | @ 05:00:00 | Stop: 2023-01-29

## 2023-01-27 MED ADMIN — ZZ IMS TEMPLATE: 150 mg | ORAL | @ 05:00:00 | Stop: 2023-02-04 | NDC 60687034911

## 2023-01-27 MED ADMIN — VITAMIN B-12 500 MCG PO TABS: 1000 ug | ORAL | @ 16:00:00 | Stop: 2023-02-25 | NDC 77333093725

## 2023-01-27 MED ADMIN — NITROGLYCERIN 0.4 % RE OINT: 1 [in_us] | RECTAL | @ 21:00:00 | Stop: 2023-02-04

## 2023-01-27 MED ADMIN — PANCRELIPASE (LIP-PROT-AMYL) 36000-114000 UNITS PO CPEP: 36000 [IU] | ORAL | @ 02:00:00 | Stop: 2023-02-04

## 2023-01-27 MED ADMIN — OXYCODONE HCL 5 MG PO TABS: 10 mg | ORAL | @ 21:00:00 | Stop: 2023-01-29 | NDC 68084035411

## 2023-01-27 MED ADMIN — HYDROMORPHONE HCL 1 MG/ML IJ SOLN: .5 mg | INTRAVENOUS | @ 13:00:00 | Stop: 2023-01-29 | NDC 00409128331

## 2023-01-27 MED ADMIN — FLUTICASONE PROPIONATE HFA 220 MCG/ACT IN AERO: 1 | RESPIRATORY_TRACT | @ 18:00:00 | Stop: 2023-02-02 | NDC 66993008096

## 2023-01-27 MED ADMIN — HYDROMORPHONE HCL 1 MG/ML IJ SOLN: .5 mg | INTRAVENOUS | @ 22:00:00 | Stop: 2023-01-29 | NDC 00409128331

## 2023-01-27 MED ADMIN — ACETAMINOPHEN 500 MG PO TABS: 1000 mg | ORAL | @ 21:00:00 | Stop: 2023-01-29

## 2023-01-27 MED ADMIN — HYDROMORPHONE HCL 1 MG/ML IJ SOLN: .5 mg | INTRAVENOUS | @ 07:00:00 | Stop: 2023-01-29 | NDC 00409128331

## 2023-01-27 MED ADMIN — SODIUM CHLORIDE 0.9 % IV BOLUS: 500 mL | INTRAVENOUS | @ 09:00:00 | Stop: 2023-01-27 | NDC 00338004903

## 2023-01-27 MED ADMIN — NICOTINE 14 MG/24HR TD PT24: 14 mg | TRANSDERMAL | @ 16:00:00 | Stop: 2023-02-04

## 2023-01-27 MED ADMIN — PANCRELIPASE (LIP-PROT-AMYL) 36000-114000 UNITS PO CPEP: 36000 [IU] | ORAL | @ 21:00:00 | Stop: 2023-02-04 | NDC 00032301613

## 2023-01-27 MED ADMIN — ONDANSETRON HCL 4 MG/2ML IJ SOLN: 4 mg | INTRAVENOUS | @ 21:00:00 | Stop: 2023-01-29 | NDC 60505613000

## 2023-01-27 MED ADMIN — CHOLESTYRAMINE 4 G PO PACK: 4 g | ORAL | @ 13:00:00 | Stop: 2023-02-02

## 2023-01-27 MED ADMIN — PEG 3350-KCL-NABCB-NACL-NASULF 236 G PO SOLR: 4000 mL | ORAL | @ 23:00:00 | Stop: 2023-01-27 | NDC 10572010001

## 2023-01-27 MED ADMIN — PANCRELIPASE (LIP-PROT-AMYL) 36000-114000 UNITS PO CPEP: 36000 [IU] | ORAL | @ 16:00:00 | Stop: 2023-02-04 | NDC 00032301613

## 2023-01-27 MED ADMIN — HYDROMORPHONE HCL 1 MG/ML IJ SOLN: .5 mg | INTRAVENOUS | @ 18:00:00 | Stop: 2023-01-29 | NDC 00409128331

## 2023-01-27 MED ADMIN — B COMPLEX PO CAPS: 1 | ORAL | @ 02:00:00 | Stop: 2023-02-25

## 2023-01-27 MED ADMIN — B COMPLEX PO CAPS: 1 | ORAL | @ 16:00:00 | Stop: 2023-02-04

## 2023-01-27 MED ADMIN — LACTATED RINGERS IV BOLUS: 500 mL | INTRAVENOUS | @ 02:00:00 | Stop: 2023-01-27 | NDC 00338011704

## 2023-01-27 MED ADMIN — ONDANSETRON HCL 4 MG/2ML IJ SOLN: 4 mg | INTRAVENOUS | @ 16:00:00 | Stop: 2023-01-29 | NDC 60505613000

## 2023-01-27 MED ADMIN — OXYCODONE HCL 5 MG PO TABS: 10 mg | ORAL | @ 06:00:00 | Stop: 2023-01-29 | NDC 68084035411

## 2023-01-27 MED ADMIN — DEXTROSE 5 %/LACTATED RINGERS: 100 mL/h | INTRAVENOUS | @ 01:00:00 | Stop: 2023-02-24 | NDC 00338012504

## 2023-01-27 MED ADMIN — OXYCODONE HCL 5 MG PO TABS: 10 mg | ORAL | @ 17:00:00 | Stop: 2023-01-29 | NDC 68084035411

## 2023-01-27 MED ADMIN — HYDROMORPHONE HCL 1 MG/ML IJ SOLN: .5 mg | INTRAVENOUS | @ 03:00:00 | Stop: 2023-01-29 | NDC 00409128331

## 2023-01-27 MED ADMIN — PANTOPRAZOLE SODIUM 40 MG IV SOLR: 40 mg | INTRAVENOUS | @ 16:00:00 | Stop: 2023-01-29 | NDC 00781323295

## 2023-01-27 MED ADMIN — DULOXETINE HCL 30 MG PO CPEP: 30 mg | ORAL | @ 16:00:00 | Stop: 2023-02-04 | NDC 60687073411

## 2023-01-27 MED ADMIN — NICOTINE 14 MG/24HR TD PT24: 14 mg | TRANSDERMAL | @ 02:00:00 | Stop: 2023-02-24

## 2023-01-27 MED ADMIN — CHOLESTYRAMINE 4 G PO PACK: 4 g | ORAL | @ 07:00:00 | Stop: 2023-02-04 | NDC 68382052860

## 2023-01-27 MED ADMIN — PANTOPRAZOLE SODIUM 40 MG IV SOLR: 40 mg | INTRAVENOUS | @ 05:00:00 | Stop: 2023-01-29 | NDC 00781323295

## 2023-01-27 MED ADMIN — DEXTROSE 5 %/LACTATED RINGERS: 100 mL/h | INTRAVENOUS | @ 09:00:00 | Stop: 2023-01-30 | NDC 00338012504

## 2023-01-27 MED ADMIN — ENOXAPARIN SODIUM 40 MG/0.4ML IJ SOSY: 40 mg | SUBCUTANEOUS | @ 21:00:00 | Stop: 2023-01-28

## 2023-01-27 MED ADMIN — VITAMIN D3 25 MCG (1000 UT) PO TABS: 25 ug | ORAL | @ 16:00:00 | Stop: 2023-02-04

## 2023-01-27 MED ADMIN — ACETAMINOPHEN 500 MG PO TABS: 1000 mg | ORAL | @ 13:00:00 | Stop: 2023-01-29

## 2023-01-27 MED ADMIN — LIDOCAINE 5 % EX PTCH: 1 | TRANSDERMAL | @ 05:00:00 | Stop: 2023-02-26

## 2023-01-27 MED ADMIN — ZZ IMS TEMPLATE: 150 mg | ORAL | @ 16:00:00 | Stop: 2023-02-25 | NDC 60687034911

## 2023-01-27 MED ADMIN — ACETAMINOPHEN 500 MG PO TABS: 1000 mg | ORAL | @ 05:00:00 | Stop: 2023-01-27

## 2023-01-27 NOTE — Progress Notes
[facility] DEPARTMENT OF MEDICINE  DIVISION OF GASTROENTEROLOGY  GENERAL GI CONSULT PROGRESS NOTE    PATIENT: Donna Gross  MRN: [id number]  DOB: 03-25-1966    ADMISSION DATE: 01/25/2023    HOSPITAL DAY: 2  DATE OF SERVICE: 01/27/2023  PRIMARY TEAM ATTENDING: Donnajean Lopes., MD  REASON FOR CONSULT: Abdominal pain and diarrhea    HPI/SUBJECTIVE  Donna Gross is a 57 y.o. female with GERD, pancreatitis, anxiety/depression, fibromyalgia, perforated gastric ulcer s/p Billroth 2 surgery (2016), partial colectomy for colonic perforation (2015) with colostomy s/p reversal, gallstone pancreatitis s/p cholecystectomy (03/27/2022) with c/f post-operative bile leak s/p ERCP (04/22/22) with empiric plastic biliary stent placement and removal (06/2022) who presented with abdominal pain, nausea, poor PO intake and diarrhea for the past year.     See HPI from consult note dated 01/26/23 for further details.    Interval Events:  2/7: No further emesis since admission. Had 3 large loose bowel movements yesterday with incontinence. Provided stool sample, but for some reason wasn't sent to lab. Declined lidocaine patch, nitroglycerin rectal ointment, tylenol. Tolerating clear liquid diet. Amenable to trying colonoscopy prep this afternoon for EGD/colo tomorrow      (click to expand/collapse)   Scheduled:  acetaminophen, 1,000 mg, Oral, TID  cholestyramine, 4 g, Oral, TID  cyanocobalamin, 1,000 mcg, Oral, Daily  DULoxetine, 30 mg, Oral, Daily  enoxaparin, 40 mg, Subcutaneous, Q24H  fluticasone, 1 puff, Inhalation, Daily  lidocaine, 1 patch, Transdermal, Q24H  nicotine, 1 patch, Transdermal, Daily  nitroglycerin, 1 inch, Rectal, Q12H  ondansetron injection/IVPB, 4 mg, Intravenous, TID w/meals  pancrelipase (Lip-Prot-Amyl), 36,000 units of lipase, Oral, TID w/meals  pantoprazole, 40 mg, IV Push, Q12H  PEG 3350/electrolytes, 4,000 mL, Oral, Once  QUEtiapine, 150 mg, Oral, BID  vitamin B complex, 1 capsule, Oral, Daily  vitamin D (cholecalciferol), 25 mcg, Oral, Daily    PRN:  albuterol, aluminum-magnesium hydroxide-simethicone, HYDROmorphone, lidocaine, metoclopramide 10 mg in dextrose 5% 50 mL IVPB, ondansetron **OR** ondansetron injection/IVPB, oxyCODONE **OR** oxyCODONE, traZODone    Infusions:   dextrose 5%/lactated ringers 100 mL/hr (01/27/23 0833)       PHYSICAL EXAM    Temp:  [36.2 ?C (97.2 ?F)-37.1 ?C (98.8 ?F)] 36.3 ?C (97.4 ?F)  Heart Rate:  [68-98] 80  Resp:  [11-20] 19  BP: (74-99)/(54-70) 93/67  NBP Mean:  [61-80] 76  SpO2:  [93 %-97 %] 93 %  BMI Readings from Last 1 Encounters:   01/26/23 17.34 kg/m?     Wt Readings from Last 10 Encounters:   01/26/23 1825 45.8 kg (101 lb)   01/25/23 0639 46.1 kg (101 lb 10.1 oz)   11/24/22 0600 49.2 kg (108 lb 7.5 oz)   07/23/22 0629 52.2 kg (115 lb)   07/01/22 1332 56.5 kg (124 lb 9 oz)   05/18/22 0734 57 kg (125 lb 10.6 oz)   05/07/22 0503 54.4 kg (120 lb 0.3 oz)   05/06/22 0423 54.5 kg (120 lb 4 oz)   05/02/22 0823 54.4 kg (120 lb)   04/21/22 2237 59.6 kg (131 lb 6.3 oz)   03/27/22 1055 64.4 kg (141 lb 15.6 oz)   02/13/22 2300 64.1 kg (141 lb 6.4 oz)   02/13/22 0846 67.6 kg (149 lb)   01/30/22 1210 68.9 kg (151 lb 12.8 oz)       System Check if examined and normal Positive or additional negative findings   Constit  [x]  General appearance - normal  Thin, in no acute distress   Eyes  [  x] Conjuctivae clear       HENMT  [x]  Normal Lips/teeth/gums, oropharynx, head     Neck  [x]  Inspection; no masses palpated     Resp  [x]  Effort good. No labored breathing     CV  []  Normal rhythm/rate   []  Normal pulses     Abdomen  []  Soft, non-tender, non-distended, no rebound guarding    []  Active bowel sounds 4 quadrants   []  No appreciable flank or shifting dullness   []  No hepatosplenomegaly   []  No umbilical hernia Soft, diffusely tender to light palpation with voluntary guarding, no rebound, increased pain over central vertical abdominal scar, well healed abdominal surgical scars   Rectal   []  No external hemorrhoids or fissure   []  No masses palpated on DRE   []  Appropriate resting pressure/sphincter tone   []  Appropriate squeeze pressure   []  Appropriate bear down maneuver   []  Chaperone present for exam     MSK  [x]  Grossly normal strength, ROM     Neuro   [x]  Grossly normal      Psychiatric  []   Oriented to time, place and person   [x]   Appropriate insight/judgement & mood/affect           LABS/STUDIES    Labs Independently Interpreted by me today  Lab Results   Component Value Date    WBC 6.78 01/27/2023    HGB 9.8 (L) 01/27/2023    HCT 31.6 (L) 01/27/2023    MCV 85.9 01/27/2023    PLT 303 01/27/2023       Imaging Tests Independently Interpreted by me today   CTAP w/ contrast 01/25/23  1. No acute CT abnormality in the abdomen or pelvis. No significant interval change from prior CT.  2. Cholecystectomy with stable biliary dilatation which is likely physiologic for this patient given lack of interval change and normal cholestatic serum markers. If clinical concern for distal biliary obstruction MRCP may be considered.  3. Stable mild wall thickening of the cecum and proximal transverse colon likely exaggerated by under distention without surrounding fat stranding    Procedures  ERCP 07/01/22 - Dr. Selena Batten - bile leak  1. No bile leak seen.   2. Removal of the previously placed plastic biliary stent.  3. Removal of biliary sludge and small stones from the CBD.  4. Anatomy consistent with Billroth 2.    ERCP 04/22/22 - Dr. Selena Batten - bile leak  DIAGNOSIS:  1. No obvious bile leak seen. But given clinical concern for bile leak following recent cholecystectomy, a 7 Fr biliary stent was placed.  2. Anatomy consistent with Billroth 2.     RECOMMENDATIONS:  1. Watch for complications including bleeding, perforation, and pancreatitis.  2. Consider cross sectional imaging to evaluate for any residual fluid collection from suspected bile leak.  3. Repeat ERCP 2-3 months for stent removal/exchange.    ERCP 04/08/21 - Dr. Chelsea Aus - choledocholithiasis  1. Successful ERCP in patient with Billroth II anatomy.  2. Choledocholithiasis, s/p sphincterotomy and balloon sweeps with duct clearance as above.       ASSESSMENT AND PLAN  Annalei Friesz is a 57 y.o. female with GERD, pancreatitis, anxiety/depression, fibromyalgia, perforated gastric ulcer s/p Billroth 2 surgery (2016), partial colectomy for colonic perforation (2015) with colostomy s/p reversal, gallstone pancreatitis s/p cholecystectomy (03/27/2022) with c/f post-operative bile leak s/p ERCP (04/22/22) with empiric plastic biliary stent placement and removal (06/2022) who presented with abdominal pain, nausea, poor PO intake  and diarrhea for the past year.     #Nausea/vomiting  #Diffuse abdominal pain  #Diarrhea  #C/f anal fissure  Since her cholecystectomy 03/2022, she complains of constant abdominal pain, worsened with PO intake, c/b post-prandial nausea/vomiting (3 times daily) and diarrhea (5-15 times daily, also with nocturnal symptoms) and rectal pain with intermittent rectal bleeding c/f fissure. Symptoms may be due to esophagitis/gastritis, query partial obstruction due to multiple abdominal surgeries c/b adhesions (never had UGIS, but no obstruction noted on CTAP). Definitely has some component of abdominal wall pain with pain overlying central surgical scar. Query some dysmotility due to opioid use, though recognize that her opioid requirement only began after post-operative symptom onset. Lower c/f symptoms due to chronic pancreatitis due to relatively normal appearance of pancreas on imaging. Query microscopic colitis iso SNRI use versus bile acid diarrhea, though doesn't notice improvement of diarrhea with cholestyramine use.       Recommendations  - Inpatient EGD + colonoscopy Thursday 01/27/23 (prep as below)   - Clear liquid diet 01/26/23  - Empiric IV pantoprazole 40mg  BID for esophagitis/gastritis tx  - Scheduled IV zofran TID before meals  - [facility] consultation for pain  - Lidocaine patch to central abdominal scar  - Continue Creon 36K with meals   - Will consider increasing dose  - Agree with fecal fat, pancreatic elastase, calprotectin, bacterial/parasite panel, fecal bile acid   - Also check H pylori stool Ag  - Consider weaning opioids as able, though patient was very upset and not amenable to this plan  - PRN topical lidocaine daily for rectal pain  - 0.4% nitroglycerin rectal ointment BID for possible anal fissure    - Colonoscopy prep (may use ''Colonoscopy Prep IP'' order set):   - Begin at 4PM with 4L GoLytely over 4 hours max and give bisacodyl 5 mg PO   - Suggest or 1 cup every 10-105min or faster. The rate is important   - If not clear at 8 PM (please have night float check) (i.e. no more yellow than ginger ale and without any particle/sediment or solid pieces), please give additional 2L Golytely over 2 hours max along with additional bisacodyl 5mg  PO   - Clear liquid diet (no reds) day before  - NPO after midnight, patient can continue to drink prep until 4 hours prior to procedure  - May require IV Reglan 10mg  periodically to tolerate prep       Code Status:  Full Code    Discussed with attending Dr. Carmie Kanner. We will continue to follow.     I reviewed these specialist notes that directly related to the patient's acute and/or chronic medical problems with documentation of the salient findings, if any: hospitalist    I have spoken with the primary team regarding the patient's care today. Our conclusions were as above.      AuthorDorian Heckle Chittajallu 01/27/2023 12:55 PM  GI Fellow PGY-4, pager [phone]  If after 5pm or on weekends, please page GI fellow on call [phone]    The patient was seen and examined with the fellow.  The case was discussed with the fellow, and I agree with the findings and plan of care we formulated together as documented in the fellow?s note.

## 2023-01-27 NOTE — Consults
Physical Therapy Evaluation, Treatment & Discharge      PATIENT: Donna Gross  MRN: [id number]  DOB: 1966-03-19    ADMIT DATE: 01/25/2023       Date of Evaluation: 01/27/2023    Problems: Principal Problem:    Abdominal pain (POA: Yes)       Past Medical History:   Diagnosis Date    Anxiety     Depression     Emphysema, unspecified (HCC/RAF)     Emphysema, unspecified (HCC/RAF)     Fibromyalgia     Gastritis     GERD (gastroesophageal reflux disease)     History of blood transfusion     Intractable nausea and vomiting 01/23/2020    Pancreatitis     Past Surgical History:   Procedure Laterality Date    ABDOMINAL SURGERY      COLON SURGERY          Relevant Hospital Course: Donna Gross is a 57 y.o. female who was admitted on 01/25/2023 with history of anxiety, depression, emphysema, fibromyalgia, gastritis, colon surgery and pancreatitis. Pt is currently admitted with complain of abdominal pain. Pt had cholecystectomy on 03/29/2023 with subsequent abdominal surgery on 04/23/2023 for ERCP and biliary stent placement. Pt is being managed for acute on chronic abdominal pain.     Patient Stated Goal: Wants abdominal pain to go away.     Living Arrangements   Type of Home: House  Home Layout: One level, Stairs to enter without rails  # Stairs to enter: 2  Bathroom Shower/Tub: Medical sales representative: Standard  Bathroom Equipment: Grab bars in shower  Home Equipment: 4-wheeled walker  Additional Comments: Walks around house and in garden. Defers walking in neighborhood because patient says she doesn't feel safe. Tires easily.    Prior Level of Function   Level of Independence: Independent, Household ambulation, Limited community distances  Lives With: Family  Support Available: Family  # of hours available: daughter, sister  ADL Assistance: Independent, Activities of Daily Living, Instrumental Activities of Daily Living  Homemaking Assistance: Independent  Vocation: On disability  Vision: Glasses, Reading, Distance  Hearing: Within Functional Limits  Transportation: Driven by Others    Precautions   Precautions: Museum/gallery exhibitions officer;Check Labs;Isolation;Other (Comment) (Contact & Spore)  Orthotic: None  Current Activity Order: Activity as tolerated  Weight Bearing Status: Not Applicable  Additional Weight Bearing Status: Not Applicable    GENERAL EVALUATION   Position: EOB;w/RN  Lines/devices Drains: IV/PICC     Bed Mobility   Supine Scooting: Not Performed  Rolling: Not Performed  Supine to Sit: Not Performed (pt at EOB pre-session)  Sit to Supine: Independent;to Right    Functional Mobility   Sit to Stand: Independent  Ambulation: Independent  Ambulation Distance (Feet): a few steps at EOB while putting on pull-up  Wheelchair: Not Applicable  Stairs: Not Performed (Pt has sufficient functional mobility to safely use stairs to enter her home.)                                                UE Assessment   R UE Assessment: Within functional limits  L UE Assessment: Within functional limits     LE Assessment   RLE Assessment: Within Functional Limits                 LLE Assessment: Within Functional  Limits                 Sensation   Sensation: Grossly intact    Cognition   Cognition: Within Defined Limits  Safety Awareness: Good awareness of safety precautions  Barriers to Learning: None            Pain Assessment   Patient complains of pain: Yes  Pain Quality: Aching  Pain Scale Used: Numeric Pain Scale  Pain Intensity: Patient unable to rate  Pain Location: Abdomen  Action Taken: Nursing notified;Patient premedicated;Positioning;Pain mgmt education                                  Patient Status   Activity Tolerance: Good  Oxygen Needs: Room Air  Response to Treatment: Tolerated treatment well;Nursing notified  Compliance with Precautions: Not Applicable  Call light in reach: Yes  Presentation post treatment: In bed;Lines/drains intact  Comments: RN cleared pt for PT. Pt was friendly and cooperative. She declined to ambulate due to pain in abdominal area. Pt demonstrated sufficient functional mobility and independence while donning pull up, standing at EOB and bed mobility to recommend discharge from PT at this time. Pt would benefit from ambulation in halls, supervised by nursing staff. Pt educated on benefits of mobility. Pt declined to sit in chair this session.    Interdisciplinary Communication   Interdisciplinary Communication: Nurse      ASSESSMENT   Inpatient Recommendation: PT evaluate & discharge;AMB with nursing;No acute care PT needs                     PT Recommendations   Discharge Recommendation: No further therapy needs  Discharge concerns: None noted  Discharge Equipment Recommended: None  Equipment ordered: Not applicable      Evaluation Completed by: Hadley Pen, PT,  01/27/2023

## 2023-01-27 NOTE — Consults
IP CM ACTIVE DISCHARGE PLANNING  Department of Care Coordination      Admit [phone]  Anticipated Date of Discharge: 01/29/2023    Following UJ:[id number], Venora Maples., MD      Today's short update     per medical team: pending GI EGD/Colo tomorrow, Anticipate DC home, no dc needs at this time.    Disposition     Home  [address]  [place] [address]  Family/Support System in agreement with the current discharge plan: Yes, in agreement and participating                 Lauraine Rinne,  01/27/2023

## 2023-01-27 NOTE — Interdisciplinary
Received pt from ED at 1810.   VSS. A&Ox4. BMAT 4 with some weakness due to abdomen pain. C/o abdomen pressure pain 10/10, PRN IV Dilaudid 0.5mg  given x1 per pt request. 2RN skin check done with Roselyn Reef, abdomen old surgical scars noted.   Bed in lowest position, call light within reach.   Endorsed care to night RN.

## 2023-01-27 NOTE — Consults
[place] DEPARTMENT OF MEDICINE  INPATIENT NOTE   [facility]    PATIENT: Donna Gross  ADMISSION DATE: 01/25/2023    HOSPITAL DAY: 2  DATE OF SERVICE: 01/25/2023    Donna Gross is a 57 y.o. female who is admitted for Abdominal pain    Subjective Report  42F h/o anxiety, depression, fm, had a cholecystectomy 4/8 and has been struggling with abdominal pain since. Admitted for acute exacerbation and c/f pancreatitis.    She has had prior instrumentation of ERCP with empiric biliary stent early May but without help. She was seen by GI who will persue upper and lower endoscopy tomorrow, and recommended EW consult for pain mgmt.    Her pain is throughout the abodmen, both upper and peri-umbilical, associated with tension and ttp throughout. It is better with self-massage and heat does provide temporary relief.     She is not at all interested in acupuncture or trigger point injections or any other needles    Lipase 32 on admission      Objective (click to expand/collapse)     I reviewed these specialist notes that directly relate to the patient's acute and/or chronic medical problems with documentation of the salient findings, if any:      Last Gastroenterology Note           01/27/23 0914 Consults signed by Lynnell Dike, DO           and      Last Hospitalist Note           01/26/23 2026 Progress Notes signed by Donnajean Lopes., MD              PRNs administered:  Medications 01/26/23 01/27/23      albuterol 90 mcg/act inh 2 puff  Dose: 2 puff  Freq: Every 6 hours PRN Route: IN  PRN Reasons: Wheezing,Shortness of Breath  Start: 01/25/23 1207   End: 02/01/23 1206         aluminum-magnesium hydroxide-simethicone 400-400-40 mg/5 mL susp 15 mL  Dose: 15 mL  Freq: Every 6 hours PRN Route: PO  PRN Comment: Dyspepsia  Start: 01/25/23 1215   End: 02/24/23 1214         HYDROmorphone 1 mg/mL inj 0.5 mg  Dose: 0.5 mg  Freq: Every 4 hours PRN Route: IV push  PRN Reasons: Severe Pain (Pain Scale 7-10),Breakthrough Pain  PRN Comment: BTP  Start: 01/26/23 1845   End: 02/25/23 1844    1853-Given     2253-Given           0514-Given     0937-Given     1415-Given             lidocaine 5% oint  Freq: Daily PRN Route: TOP  PRN Comment: anal fissure pain  Start: 01/26/23 2010   End: 02/25/23 2009         metoclopramide 10 mg in dextrose 5% 50 mL IVPB  Dose: 10 mg  Freq: Every 8 hours PRN Route: IV  PRN Reasons: Vomiting,Nausea  PRN Comment: 2nd line  Start: 01/27/23 0637   End: 02/26/23 0636         ondansetron tab 4 mg  Dose: 4 mg  Freq: Every 8 hours PRN Route: PO  PRN Reason: Nausea  Start: 01/25/23 1215   End: 02/24/23 1214    1742-See Alt               Or  ondansetron 4 mg/2 mL  inj 4 mg  Dose: 4 mg  Freq: Every 8 hours PRN Route: IV  PRN Reason: Nausea  Start: 01/25/23 1215   End: 02/24/23 1214    1742-Given                oxyCODONE tab 5 mg  Dose: 5 mg  Freq: Every 4 hours PRN Route: PO  PRN Reason: Moderate Pain (Pain Scale 4-6)  Start: 01/26/23 1720   End: 02/02/23 1719    1742-See Alt     2209-See Alt           0434-See Alt     0830-See Alt     1232-See Alt            Or  oxyCODONE tab 10 mg  Dose: 10 mg  Freq: Every 4 hours PRN Route: PO  PRN Reason: Severe Pain (Pain Scale 7-10)  Start: 01/26/23 1720   End: 02/02/23 1719    1742-Given [C]     2209-Given           0434-Given     0830-Given     1232-Given             traZODone tab 100 mg  Dose: 100 mg  Freq: Every night at bedtime PRN Route: PO  PRN Comment: Sleep  Start: 01/25/23 1215   End: 02/24/23 1214    2051-Given                        PHYSICAL EXAM    Temp:  [36.2 ?C (97.2 ?F)-37.1 ?C (98.8 ?F)] 36.3 ?C (97.4 ?F)  Heart Rate:  [68-98] 80  Resp:  [16-20] 19  BP: (74-99)/(54-70) 93/67  NBP Mean:  [61-80] 76  SpO2:  [93 %-97 %] 93 %   Weight: 45.8 kg (101 lb) Oxygen Therapy  SpO2: 93 %  O2 Device: None (Room air)    PHYSICAL EXAM:      System Check if normal Positive or additional negative findings   Constit  []  General appearance     Eyes  []  Conj/Lids []  Pupils  []  Fundi     HENMT  []  External ears/nose []  Otoscopy   []  Gross Hearing []  Nasal mucosa   []  Lips/teeth/gums []  Oropharynx    []  mucus membranes []  Head     Neck  []  Inspection/palpation []  Thyroid     Resp  []  Effort []  Wheezing    []  Auscultation  []  Crackles     CV  []  Rhythm/rate   []  Murmurs   []  LEE   []  JVP non-elevated    Normal pulses:   []  Radial []  Femoral  []  Pedal     Breast  []  Inspection []  Palpation     GI  []  abd masses    []  tenderness   []  rebound/guarding   []  Liver/spleen []  Rectal     GU  M: []  Scrotum []  Penis []  Prostate   F:  []  External []  vaginal wall        []  Cervix  []  mucus        []  Uterus    []  Adnexa      Lymph  []  Neck []  Axillae []  Groin     MSK Specify site examined:    []  Inspect/palp []  ROM   []  Stability []  Strength/tone  Scarring noted in abdomen, including midline. Carnett's sign positive       Skin  []  Inspection []  Palpation  Neuro  []  CN2-12 intact grossly   []  Alert and oriented   []  DTR      []  Muscle strength      []  Sensation   []  Gait/balance     Psych  []  Insight/judgement     []  Mood/affect    []  Gross cognition            LABS/STUDIES    Lab Studies:    Lab Results   Component Value Date    WBC 6.78 01/27/2023    HGB 9.8 (L) 01/27/2023    HCT 31.6 (L) 01/27/2023    MCV 85.9 01/27/2023    PLT 303 01/27/2023     INR   Date Value Ref Range Status   01/27/2023 1.0 . Final     Comment:     Therapeutic Range: INR 2-3  Mechanical Valves: INR 2.5-3.5       INR (Outside Lab)   Date Value Ref Range Status   03/09/2021 0.90  Final     Prothrombin Time   Date Value Ref Range Status   01/27/2023 13.3 11.5 - 14.4 seconds Final     Prothrombin Time (Outside Lab)   Date Value Ref Range Status   03/09/2021 12.3  Final     APTT   Date Value Ref Range Status   04/08/2021 27.3 24.4 - 36.2 seconds Final     Comment:     aPTT based therapeutic range for unfractionated heparin therapy is 85.5-111.6 seconds.     Magnesium   Date Value Ref Range Status   01/26/2023 1.2 (L) 1.4 - 1.9 mEq/L Final         Imaging Studies:     all imaging in past 24 hours No imaging has been resulted in the last 24 hours     TREATMENTS PROVIDED TODAY  none         ASSESSMENT AND PLAN    Donna Gross is a 57 y.o. female admitted for Abdominal pain    #abdominal pain - with onset following cholecystectomy and with subsequent instrumentation as well. Given positive Carnett's sign exam findings, some of her pain may be reactive (post-traumatic) myofascial pain. In this situation, her pain would persist despite negative imaging, lab and endoscopy findings. That said, would need to wait for endoscopy findings first.  -recommended acupuncture and/or trigger point injections both to confirm dx and treat it, but these were declined  -will f/u Fri after endoscopies and reconsider if there are normal findings  -in addition, can consider workup for porphyria, lead level, and celiac disease as it is unclear if these have been done yet     Will cont to follow    Thanks for involving [place] in the care of this patient      Author: Alric Quan 01/27/2023 3:44 PM  [facility] pager [phone]

## 2023-01-27 NOTE — Other
Patient's Clinical Goal:   Clinical Goal(s) for the Shift: VSS, safety, pain relief, GI procedure prep, comfort, rest, stool samples    Identify possible barriers to advancing the care plan: none    Stability of the patient: Moderately Stable - low risk of patient condition declining or worsening     Progression of Patient's Clinical Goal:   Problem/Goal:   Abdominal pain s/p abdominal surgery (04/22/22)  Hx - anxiety, depression, emphysema, fibromyalgia, gastritis, GERD, colon surgery and pancreatitis, cholecystectomy (03/28/22), ERCP + biliary stent placement (5/3-5/8)  Response to Plan of Care and any changes in condition (clinical condition, any concerning assessments, events, interventions; response to POC effectiveness or changes made):   A/O x4, BP stable on the soft side, able to verbalize needs  Sat > 93% on RA, denies SOB or distress  Clear liquid diet, no red items, able to feed self   BMAT 4 - pt refused bed alarm, independent, continent x2  Managed nausea w/ scheduled Zofran IVP TID w/ meals  Managed c/o abdominal pain w/ PRN Dilaudid q4 + Oxy q4  Continued D5LR infusion at 128mL/hr, pt tolerating well  Initiated Golytely solution -- pt verbalized understanding of admin instructions    Interdisciplinary communication (team member communication):   PT consulted - no acute care needs determined    Psychosocial communication (family or patient issues potentially affecting care):   none  Plan and Disposition (progression toward specific goals of care/hospitalization and discharge):  Stool samples pending  NPO 2/8 0700 - pending GI EGD/Colo  Pain relief  E/W med consult pending

## 2023-01-27 NOTE — Other
Patient's Clinical Goal:   Clinical Goal(s) for the Shift: VSS, safety, comfort, pain mgmt, stool collection, C diff r/o, comfort  Identify possible barriers to advancing the care plan: none  Stability of the patient: Moderately Stable - low risk of patient condition declining or worsening   Progression of Patient's Clinical Goal:     Code: FULL     Hx: anxiety, depression, emphysema, fibromyalgia, gastritis, GERD, colon surgery and pancreatitis      Problem/Goal: Abd pain, diarrhea, pancreatitis, AKI    Response to Plan of Care and any changes in condition (clinical condition, any concerning assessments, events, interventions; response to POC effectiveness or changes made):     Contact/spore isolation due to C Diff rule out.  AAOx4. Afebrile. Denies any nausea. PRN PO oxycodone and IV dilaudid for abd pain w/ relief.  RA. O2 > 93%. No SOB noted.  D5LR 100 mL/hr due to AKI.   x1 500 mL NS bolus due to BP 82/64 MAP 72. Repeat BP 98/70 MAP 80.  Diet: CLD no red items.  Last BM 2/6. Pt has briefs on due to bowel incontinence. Pt refusing to take briefs off despite education on risk of skin damage.    Interdisciplinary communication (team member communication):   none  Psychosocial communication (family or patient issues potentially affecting care):   none  Plan and Disposition (progression toward specific goals of care/hospitalization and discharge):  Pain mgmt, EGD 2/8 per GI team and needs bowel prep, pending stool collection   Dispo: Home pending medical stability

## 2023-01-27 NOTE — Progress Notes
INTERNAL MEDICINE - INPATIENT PROGRESS NOTE    ADMISSION DATE: 01/25/2023    DATE OF SERVICE: 01/26/2023  HOSPITAL DAY: 1    PRINCIPLE PROBLEM: Abdominal pain    CC: Abdominal Pain (Abdominal pain, vomiting and diarrhea x6 days Denies fever. )    INTERVAL EVENTS AND REVIEW OF SYSTEMS:  Patient reports continued abdominal pain, worse in epigastric region.  Minimal PO in ED, with small stool output   GI consulted    MEDICATIONS:  acetaminophen, 1,000 mg, Oral, TID  acetaminophen, 1,000 mg, Oral, TID  cholestyramine, 4 g, Oral, TID  cyanocobalamin, 1,000 mcg, Oral, Daily  DULoxetine, 30 mg, Oral, Daily  enoxaparin, 40 mg, Subcutaneous, Q24H  fluticasone, 1 puff, Inhalation, Daily  lidocaine, 1 patch, Transdermal, Q24H  nicotine, 1 patch, Transdermal, Daily  nitroglycerin, 1 inch, Rectal, Q12H  [START ON 01/27/2023] ondansetron injection/IVPB, 4 mg, Intravenous, TID w/meals  pancrelipase (Lip-Prot-Amyl), 36,000 units of lipase, Oral, TID w/meals  pantoprazole, 40 mg, IV Push, Q12H  QUEtiapine, 150 mg, Oral, BID  vitamin B complex, 1 capsule, Oral, Daily  vitamin D (cholecalciferol), 25 mcg, Oral, Daily  PRNs: albuterol, aluminum-magnesium hydroxide-simethicone, HYDROmorphone, lidocaine, ondansetron **OR** ondansetron injection/IVPB, oxyCODONE **OR** oxyCODONE, traZODone    VITALS:  Temp:  [36.2 ?C (97.2 ?F)-37.1 ?C (98.8 ?F)] 37.1 ?C (98.8 ?F)  Heart Rate:  [65-98] 96  Resp:  [11-20] 18  BP: (76-112)/(56-70) 93/70  NBP Mean:  [16-78] 78  SpO2:  [95 %-96 %] 96 %     Weight: 45.8 kg (101 lb) Oxygen Therapy  SpO2: 96 %  O2 Device: None (Room air)     IN'S AND OUT'S:  I/O last 2 completed shifts:  In: 551.7 [I.V.:51.7; IV Piggyback:500]  Out: -     PHYSICAL EXAM:  General: alert, well appearing, and in no distress.   Cardiac: regular rate and rhythm, no murmurs.   Lungs: Clear to auscultation, normal work of breathing  Abdomen: soft, diffuse discomfort with palpation with worsened tenderness in epigastric region, nondistended  Neuro: fully oriented, no focal deficit    DATA  I have reviewed the following information from the last 24 hours: allied health and treating physician notes, imaging, labs and microbiology data, and cardiac studies and telemetry data (if on monitor)  Na/K/Cl/CO2/BUN/Cr/glu:  137/3.7/107/21/9/1.06/129 (02/06 0637)  AST/ALT/Bili T/Alk Phos/Prot/Alb:  20/10/0.2/62/5.7/3.7 (02/06 1610)  WBC/Hgb/Hct/Plts:  6.47/9.7/30.6/312 (02/06 1159)            ASSESSMENT:  Donna Gross is a 57 y.o. female with hx of anxiety, depression, emphysema, fibromyalgia, gastritis, GERD, colon surgery and pancreatitis who presents to the ED with complaint of abdominal pain s/p abdominal surgery on 5/3. Pt had a cholecystectomy on 4/8, was treated for an infection, and admitted from 5/3-5/8 for ERCP and biliary stent placement who now presents with and is admitted with acute on chronic abdominal pain.     #Abdominal Pain, POA   #Diarrhea- POA, chronic  #hx of pancreatitis  Patient recently underwent CT imaging without acute pathologic findings  - ADAT   - mIVF at 100 cc/hr   - Tylenol/Oxy/Dilaudid for pain, prn topical lidocaine for rectal pain, nitroglycerin rectal ointment BID   - C/S to Nutrition Services   - Zofran 4 mg q8h TID prior to meals  - stool studies: Ova/Parasite Studies, bile acid panel, fecal elastase panel, calprotectin panel, bacterial enteropathogen panel, parasitic enteropathogen panel, h.pylori ag  - start protonix IV BID   - continue home pancrelipase (creon ) 36,000 u TID with  meals  - continue home cholestyramine 4g TID  - plan for EDG and colo on 2/8 (Thursday)- will CLD on 2/7 and prep in PM       #AKI  - Baseline creat likely 0.8-1.1; admission Creat 1.3  - suspect AKI in setting of poor PO intake/diarrhea  - mIVF to ensure appropriate fluid intake  - monitor BMP; avoid nephrotoxic agents     #Anxiety, POA   #Depression, POA   #Fibromyalgia   #Insomnia,   - continue Seroquel 150mg  BID  - Continue duloxetine 40 mg qday   -Trazodone 100 mg at bedtime PRN   #Tobacco Dependence - Nicotine patch           INPATIENT CHECKLIST:  Oral nutrition supplements Breakfast, Lunch, Dinner  Diet low fiber Outside food is ok  Diet clear liquid No Red Items  VTE Prophylaxis: enoxaparin  Central Lines: none  Tubes/Drains: none  Activity: as tolerated  Precautions: No infectious isolation,     ADVANCED DIRECTIVES:  Full Code, Primary Emergency Contact: [name],[name]    DISPOSITION:  Inpatient, Non-Monitored. Expected post-hospitalization disposition will be to home.    AUTHOR:  Adalie Mand M. Ninetta Lights, MD  01/26/2023 at 8:14 PM

## 2023-01-27 NOTE — Nursing Note
Assumed care of pt at 1715. PRN nausea, and pain medications administered, and bolus initiated. Pt transferred to 4[facility] at 1745 with all belongings.

## 2023-01-28 LAB — Lipid Panel: CHOLESTEROL,LDL,CALCULATED: 65 mg/dL (ref >50–<100)

## 2023-01-28 LAB — Basic Metabolic Panel
ANION GAP: 9 mmol/L (ref 8–19)
ANION GAP: 9 mmol/L (ref 8–19)

## 2023-01-28 LAB — IgA,Serum: IGA,SERUM: 139 mg/dL (ref 76–426)

## 2023-01-28 LAB — Magnesium: MAGNESIUM: 1.2 meq/L — ABNORMAL LOW (ref 1.4–1.9)

## 2023-01-28 LAB — CBC: PLATELET COUNT, AUTO: 314 10*3/uL (ref 143–398)

## 2023-01-28 MED ADMIN — OXYCODONE HCL 5 MG PO TABS: 10 mg | ORAL | @ 09:00:00 | Stop: 2023-01-29 | NDC 68084035411

## 2023-01-28 MED ADMIN — OXYCODONE HCL 5 MG PO TABS: 10 mg | ORAL | @ 14:00:00 | Stop: 2023-01-29 | NDC 68084035411

## 2023-01-28 MED ADMIN — PANCRELIPASE (LIP-PROT-AMYL) 36000-114000 UNITS PO CPEP: 36000 [IU] | ORAL | @ 02:00:00 | Stop: 2023-02-04 | NDC 00032301613

## 2023-01-28 MED ADMIN — CHOLESTYRAMINE 4 G PO PACK: 4 g | ORAL | @ 19:00:00 | Stop: 2023-02-06

## 2023-01-28 MED ADMIN — DULOXETINE HCL 30 MG PO CPEP: 30 mg | ORAL | @ 16:00:00 | Stop: 2023-02-04 | NDC 60687073411

## 2023-01-28 MED ADMIN — TRAZODONE HCL 100 MG PO TABS: 100 mg | ORAL | @ 05:00:00 | Stop: 2023-02-04 | NDC 60687045411

## 2023-01-28 MED ADMIN — OXYCODONE HCL 5 MG PO TABS: 10 mg | ORAL | @ 19:00:00 | Stop: 2023-01-29 | NDC 68084035411

## 2023-01-28 MED ADMIN — HYDROMORPHONE HCL 1 MG/ML IJ SOLN: .5 mg | INTRAVENOUS | @ 02:00:00 | Stop: 2023-01-29 | NDC 00409128331

## 2023-01-28 MED ADMIN — HYDROMORPHONE HCL 1 MG/ML IJ SOLN: .5 mg | INTRAVENOUS | @ 16:00:00 | Stop: 2023-01-29 | NDC 00409128331

## 2023-01-28 MED ADMIN — SODIUM CHLORIDE 0.9% IV SOLN (250 ML): 10 mL/h | INTRAVENOUS | @ 19:00:00 | Stop: 2023-02-27 | NDC 00338004902

## 2023-01-28 MED ADMIN — MAGNESIUM SULFATE 4 GM/100ML IV SOLN: 4 g | INTRAVENOUS | @ 19:00:00 | Stop: 2023-01-28 | NDC 63323010601

## 2023-01-28 MED ADMIN — PANTOPRAZOLE SODIUM 40 MG IV SOLR: 40 mg | INTRAVENOUS | @ 05:00:00 | Stop: 2023-01-29 | NDC 00781323295

## 2023-01-28 MED ADMIN — NITROGLYCERIN 0.4 % RE OINT: 1 [in_us] | RECTAL | @ 06:00:00 | Stop: 2023-02-26

## 2023-01-28 MED ADMIN — HYDROMORPHONE HCL 1 MG/ML IJ SOLN: .5 mg | INTRAVENOUS | @ 07:00:00 | Stop: 2023-01-29 | NDC 00409128331

## 2023-01-28 MED ADMIN — LIDOCAINE HCL (CARDIAC) 100 MG/5ML IV SOSY: INTRAVENOUS | Stop: 2023-01-28 | NDC 76329339001

## 2023-01-28 MED ADMIN — NITROGLYCERIN 0.4 % RE OINT: 1 [in_us] | RECTAL | @ 19:00:00 | Stop: 2023-02-04

## 2023-01-28 MED ADMIN — OXYCODONE HCL 5 MG PO TABS: 10 mg | ORAL | Stop: 2023-01-29 | NDC 68084035411

## 2023-01-28 MED ADMIN — HYDROMORPHONE HCL 1 MG/ML IJ SOLN: .5 mg | INTRAVENOUS | @ 12:00:00 | Stop: 2023-01-29 | NDC 00409128331

## 2023-01-28 MED ADMIN — ONDANSETRON HCL 4 MG/2ML IJ SOLN: 4 mg | INTRAVENOUS | @ 19:00:00 | Stop: 2023-01-29

## 2023-01-28 MED ADMIN — LIDOCAINE 5 % EX PTCH: 1 | TRANSDERMAL | @ 05:00:00 | Stop: 2023-02-04

## 2023-01-28 MED ADMIN — DEXTROSE 5 %/LACTATED RINGERS: 100 mL/h | INTRAVENOUS | @ 12:00:00 | Stop: 2023-01-30 | NDC 00338012504

## 2023-01-28 MED ADMIN — FLUTICASONE PROPIONATE HFA 220 MCG/ACT IN AERO: 1 | RESPIRATORY_TRACT | @ 16:00:00 | Stop: 2023-02-06 | NDC 66993008096

## 2023-01-28 MED ADMIN — PANCRELIPASE (LIP-PROT-AMYL) 36000-114000 UNITS PO CPEP: 36000 [IU] | ORAL | @ 19:00:00 | Stop: 2023-02-25

## 2023-01-28 MED ADMIN — ZZ IMS TEMPLATE: 150 mg | ORAL | @ 05:00:00 | Stop: 2023-02-25 | NDC 60687034911

## 2023-01-28 MED ADMIN — ONDANSETRON HCL 4 MG/2ML IJ SOLN: 4 mg | INTRAVENOUS | @ 14:00:00 | Stop: 2023-01-29 | NDC 60505613000

## 2023-01-28 MED ADMIN — ACETAMINOPHEN 500 MG PO TABS: 1000 mg | ORAL | @ 05:00:00 | Stop: 2023-01-29 | NDC 00904673061

## 2023-01-28 MED ADMIN — CHOLESTYRAMINE 4 G PO PACK: 4 g | ORAL | @ 05:00:00 | Stop: 2023-02-04

## 2023-01-28 MED ADMIN — VITAMIN B-12 500 MCG PO TABS: 1000 ug | ORAL | @ 16:00:00 | Stop: 2023-02-25 | NDC 77333093725

## 2023-01-28 MED ADMIN — DEXTROSE 5 %/LACTATED RINGERS: 100 mL/h | INTRAVENOUS | @ 02:00:00 | Stop: 2023-01-30 | NDC 00338012504

## 2023-01-28 MED ADMIN — PANTOPRAZOLE SODIUM 40 MG IV SOLR: 40 mg | INTRAVENOUS | @ 16:00:00 | Stop: 2023-01-29 | NDC 00781323295

## 2023-01-28 MED ADMIN — B COMPLEX PO CAPS: 1 | ORAL | @ 16:00:00 | Stop: 2023-02-25

## 2023-01-28 MED ADMIN — ONDANSETRON HCL 4 MG/2ML IJ SOLN: 4 mg | INTRAVENOUS | @ 02:00:00 | Stop: 2023-01-29 | NDC 60505613000

## 2023-01-28 MED ADMIN — OXYCODONE HCL 5 MG PO TABS: 10 mg | ORAL | @ 05:00:00 | Stop: 2023-02-03 | NDC 68084035411

## 2023-01-28 MED ADMIN — CHOLESTYRAMINE 4 G PO PACK: 4 g | ORAL | @ 14:00:00 | Stop: 2023-02-04

## 2023-01-28 MED ADMIN — ACETAMINOPHEN 500 MG PO TABS: 1000 mg | ORAL | @ 14:00:00 | Stop: 2023-01-29

## 2023-01-28 MED ADMIN — NICOTINE 14 MG/24HR TD PT24: 14 mg | TRANSDERMAL | @ 16:00:00 | Stop: 2023-02-24

## 2023-01-28 MED ADMIN — PANCRELIPASE (LIP-PROT-AMYL) 36000-114000 UNITS PO CPEP: 36000 [IU] | ORAL | @ 16:00:00 | Stop: 2023-02-25

## 2023-01-28 MED ADMIN — HYDROMORPHONE HCL 1 MG/ML IJ SOLN: .5 mg | INTRAVENOUS | @ 20:00:00 | Stop: 2023-01-29 | NDC 00409128331

## 2023-01-28 MED ADMIN — PROPOFOL 200 MG/20ML IV EMUL: INTRAVENOUS | Stop: 2023-01-28 | NDC 63323026929

## 2023-01-28 MED ADMIN — NICOTINE 14 MG/24HR TD PT24: 14 mg | TRANSDERMAL | @ 16:00:00 | Stop: 2023-02-04 | NDC 00536589588

## 2023-01-28 MED ADMIN — VITAMIN D3 25 MCG (1000 UT) PO TABS: 25 ug | ORAL | @ 16:00:00 | Stop: 2023-02-04

## 2023-01-28 MED ADMIN — ACETAMINOPHEN 500 MG PO TABS: 1000 mg | ORAL | @ 19:00:00 | Stop: 2023-01-29

## 2023-01-28 MED ADMIN — DEXTROSE 5 %/LACTATED RINGERS: 100 mL/h | INTRAVENOUS | @ 21:00:00 | Stop: 2023-01-30

## 2023-01-28 MED ADMIN — ZZ IMS TEMPLATE: 150 mg | ORAL | @ 16:00:00 | Stop: 2023-02-04 | NDC 60687034911

## 2023-01-28 MED ADMIN — SODIUM CHLORIDE 0.9 % IV SOLN: INTRAVENOUS | Stop: 2023-01-28 | NDC 00338004904

## 2023-01-28 MED ADMIN — PROPOFOL 200 MG/20ML IV EMUL (ANES): INTRAVENOUS | Stop: 2023-01-28

## 2023-01-28 NOTE — Other
Patient's Clinical Goal:   Clinical Goal(s) for the Shift: VSS, safety, pain + nausea relief, GI procedure, comfort, rest, stool samples, IV fluids    Identify possible barriers to advancing the care plan: none    Stability of the patient: Moderately Stable - low risk of patient condition declining or worsening     Progression of Patient's Clinical Goal:   Problem/Goal:   Abdominal pain s/p abdominal surgery (04/22/22)  Hx - anxiety, depression, emphysema, fibromyalgia, gastritis, GERD, colon surgery and pancreatitis, cholecystectomy (03/28/22), ERCP + biliary stent placement (5/3-5/8)  Response to Plan of Care and any changes in condition (clinical condition, any concerning assessments, events, interventions; response to POC effectiveness or changes made):   A/O x4, VSS, able to verbalize needs  Sat > 93% on RA, denies SOB or distress  Advanced to low fiber diet, able to feed self, good appetite   Managed nausea w/ scheduled Zofran IVP w/ dinner meal   BMAT 4 - pt refused bed alarm, independent, continent x2  Managed c/o abdominal pain w/ PRN Dilaudid q4 + Oxy q4  Continued D5LR infusion at 155m/hr, pt tolerating well  Mg : 1.2 - administered magsulfate 4g IVPB, pt tolerated well  2/8 Upper GI Endoscopy w/ gastric biopsy [H Pylori r/o]    Interdisciplinary communication (team member communication):   GI note regarding today's procedure  Psychosocial communication (family or patient issues potentially affecting care):   none  Plan and Disposition (progression toward specific goals of care/hospitalization and discharge):  Stool samples pending   Pain + nausea relief regimen  2/8 CDiff PCR results pending  2/8 gastric biopsy results pending

## 2023-01-28 NOTE — H&P
UPDATED H&P REQUIREMENT    For [place] [facility] and [name]    WHAT IS THE STATUS OF THE PATIENT'S MOST CURRENT HISTORY AND PHYSICAL?   - The most current H&P is >24 hours and <30 days, and having examined the patient, I attest that there have been no changes. (This suffices as an update to the H&P).      REFER TO MEDICAL STAFF POLICIES REGARDING PRE-PROCEDURE HISTORY AND PHYSICAL EXAMINATION AND UPDATED H&P REQUIREMENTS BELOW:    [facility] and [name] Medical Staff Policy 200 - For Patients Undergoing Procedures Requiring Moderate or Deep Sedation, General Anesthesia or Regional Anesthesia    Contents of a History and Physical Examination (H&P):    The H&P shall consist of chief complaint, history of present illness, allergies and medications, relevant social and family history, past medical history, review of systems and physical examination, and assessment and plan appropriate to the patient's age.    For Patients Undergoing Procedures Requiring Moderate or Deep Sedation, General Anesthesia or Regional Anesthesia:    1. An H&P shall be performed within 24 hours prior to the procedure by a qualified member of the medical staff or designee with appropriate privileges, except as noted in item 2 below.    2. If a complete history and physical was performed within thirty (30) calendar days prior to the patient's admission to the Medical Center for elective surgery, a member of the medical staff assumes the responsibility for the accuracy of the clinical information and will need to document in the medical record within twenty-four (24) hours of admission and prior to surgery or major invasive procedure, that they either attest that the history and physical has been reviewed and accepted, or document an update of the original history and physical relevant to the patient's current  clinical status.    3. Providing an H&P for patients undergoing surgery under local anesthesia is at the discretion of the Attending Physician.     4. When a procedure is performed by a dentist, podiatrist or other practitioner who is not privileged to perform an H&P, the anesthesiologist's assessment immediately prior to the procedure will constitute the 24 hour re-assessment.The dentist, podiatrist or other practitioner who is not privileged to perform an H&P will document the history and physical relevant to the procedure.    5. If the H&P and the written informed consent for the surgery or procedure are not recorded in the patient's medical record prior to surgery, the operation shall not be performed unless the attending physician states in writing that such a delay could lead to an adverse event or irreversible damage to the patient.    6. The above requirements shall not preclude the rendering of emergency medical or surgical care to a patient in dire circumstances.

## 2023-01-28 NOTE — Progress Notes
INTERNAL MEDICINE - INPATIENT PROGRESS NOTE    ADMISSION DATE: 01/25/2023    DATE OF SERVICE: 01/27/2023  HOSPITAL DAY: 2    PRINCIPLE PROBLEM: Abdominal pain    CC: Abdominal Pain (Abdominal pain, vomiting and diarrhea x6 days Denies fever. )    INTERVAL EVENTS AND REVIEW OF SYSTEMS:  Patient with abdominal pain and multiple episodes of diarrhea after dinner on prior day.   Reports continued abdominal tenderness. However resolved n/v  CLD today, with prep for EGD/colo tomorrow    MEDICATIONS:  acetaminophen, 1,000 mg, Oral, TID  cholestyramine, 4 g, Oral, TID  cyanocobalamin, 1,000 mcg, Oral, Daily  DULoxetine, 30 mg, Oral, Daily  enoxaparin, 40 mg, Subcutaneous, Q24H  fluticasone, 1 puff, Inhalation, Daily  lidocaine, 1 patch, Transdermal, Q24H  nicotine, 1 patch, Transdermal, Daily  nitroglycerin, 1 inch, Rectal, Q12H  ondansetron injection/IVPB, 4 mg, Intravenous, TID w/meals  pancrelipase (Lip-Prot-Amyl), 36,000 units of lipase, Oral, TID w/meals  pantoprazole, 40 mg, IV Push, Q12H  QUEtiapine, 150 mg, Oral, BID  vitamin B complex, 1 capsule, Oral, Daily  vitamin D (cholecalciferol), 25 mcg, Oral, Daily  PRNs: albuterol, aluminum-magnesium hydroxide-simethicone, HYDROmorphone, lidocaine, metoclopramide 10 mg in dextrose 5% 50 mL IVPB, oxyCODONE **OR** oxyCODONE, traZODone    VITALS:  Temp:  [36.2 ?C (97.2 ?F)-37.1 ?C (98.8 ?F)] 36.5 ?C (97.7 ?F)  Heart Rate:  [68-98] 76  Resp:  [16-20] 18  BP: (74-99)/(54-70) 96/65  NBP Mean:  [61-80] 76  SpO2:  [93 %-97 %] 96 %     Weight: 45.8 kg (101 lb) Oxygen Therapy  SpO2: 96 %  O2 Device: None (Room air)     IN'S AND OUT'S:  I/O last 2 completed shifts:  In: 2923.3 [P.O.:820; I.V.:1103.3; IV Piggyback:1000]  Out: -     PHYSICAL EXAM:  General: alert, well appearing, and in no distress.   Cardiac: regular rate and rhythm, no murmurs.   Lungs: Clear to auscultation, normal work of breathing  Abdomen: soft, diffuse discomfort with palpation with worsened tenderness in epigastric region, nondistended  Neuro: fully oriented, no focal deficit    DATA  I have reviewed the following information from the last 24 hours: allied health and treating physician notes, imaging, labs and microbiology data, and cardiac studies and telemetry data (if on monitor)  Na/K/Cl/CO2/BUN/Cr/glu:  138/4.3/108/22/9/0.97/113 (02/07 1610)     WBC/Hgb/Hct/Plts:  6.78/9.8/31.6/303 (02/07 9604)  PT/INR/APTT/Fib:  13.3/1.0/--/-- (02/07 5409)         ASSESSMENT:  Donna Gross is a 57 y.o. female with hx of anxiety, depression, emphysema, fibromyalgia, gastritis, GERD, colon surgery and pancreatitis who presents to the ED with complaint of abdominal pain s/p abdominal surgery on 5/3. Pt had a cholecystectomy on 4/8, was treated for an infection, and admitted from 5/3-5/8 for ERCP and biliary stent placement who now presents with and is admitted with acute on chronic abdominal pain.     #Abdominal Pain, POA   #Diarrhea- POA, chronic  #hx of pancreatitis  #Severe protein calorie malnutrition, POA  Patient recently underwent CT imaging without acute pathologic findings  - ADAT   - mIVF at 100 cc/hr   - Tylenol/Oxy/Dilaudid for pain, prn topical lidocaine for rectal pain, nitroglycerin rectal ointment BID   - C/S to Nutrition Services   - Zofran 4 mg q8h TID prior to meals  - stool studies: Ova/Parasite Studies, bile acid panel, fecal elastase panel, calprotectin panel, bacterial enteropathogen panel, parasitic enteropathogen panel, h.pylori ag  - start protonix IV BID   -  continue home pancrelipase (creon ) 36,000 u TID with meals  - continue home cholestyramine 4g TID  - plan for EDG and colo on 2/8 (Thursday)- will CLD on 2/7 and prep in PM  - EW med consult: concern for myofascial component to pain. Patient declined acupuncture/trigger point today, pending EDG/colo     #AKI- improved  - Baseline creat likely 0.8-1.1; admission Creat 1.3  - suspect AKI in setting of poor PO intake/diarrhea  - mIVF to ensure appropriate fluid intake  - monitor BMP; avoid nephrotoxic agents     #Anxiety, POA   #Depression, POA   #Fibromyalgia   #Insomnia,   - continue Seroquel 150mg  BID  - Continue duloxetine 40 mg qday   -Trazodone 100 mg at bedtime PRN   #Tobacco Dependence - Nicotine patch        INPATIENT CHECKLIST:  Diet: CLD, NPO in AM for procedure  VTE Prophylaxis: enoxaparin- hold for procedure   Central Lines: none  Tubes/Drains: none  Activity: as tolerated  Precautions: No infectious isolation,      ADVANCED DIRECTIVES:  Full Code, Primary Emergency Contact: [name],[name]     DISPOSITION:  Inpatient, Non-Monitored. Expected post-hospitalization disposition will be to home.    AUTHOR:  Dave Mannes M. Ninetta Lights, MD  01/27/2023 at 4:37 PM

## 2023-01-28 NOTE — Other
Patient's Clinical Goal:   Clinical Goal(s) for the Shift: VSS, safety, pain relief, GI procedure prep, comfort, rest, stool samples  Identify possible barriers to advancing the care plan:   Stability of the patient: Moderately Unstable - medium risk of patient condition declining or worsening    Progression of Patient's Clinical Goal:   Problem/Goal:   Abdominal pain  Response to Plan of Care and any changes in condition (clinical condition, any concerning assessments, events, interventions; response to POC effectiveness or changes made):   Continues with abdominal pain being managed with minimal effect with oxy PO and IV dilaudid. Patient only able to drink about half of Golytely. 1 watery tan colored BM around midnight. NPO as of 0700 2/8. On D5LR at 188m/hr. Zofran given this morning for nausea.   Interdisciplinary communication (team member communication):   Psychosocial communication (family or patient issues potentially affecting care):   Plan and Disposition (progression toward specific goals of care/hospitalization and discharge):  Stool sample pending - patient had an accident during last BM so could not collect.   NPO 2/8 0700 for EGD and colonoscopy

## 2023-01-29 LAB — Helicobacter pylori Antigen, Stool: H. PYLORI ANTIGEN,STOOL: NEGATIVE

## 2023-01-29 LAB — Transglutaminase IgA: TRANSGLUTAMINASE IGA (BIOFLASH): <20 CU (ref <20.0)

## 2023-01-29 LAB — C difficile PCR: C DIFFICILE PCR: NEGATIVE

## 2023-01-29 LAB — Gliadin-Deamidated IgG: GLIADIN (DEAMIDATED) AB, IGG (BIOFLASH): <20 CU (ref <20.0)

## 2023-01-29 LAB — Basic Metabolic Panel
ESTIMATED GFR 2021 CKD-EPI: 65 mL/min/{1.73_m2} (ref 135–146)
ESTIMATED GFR 2021 CKD-EPI: 65 mL/min/{1.73_m2} (ref 8.6–10.4)

## 2023-01-29 LAB — Gliadin-Deamidated IgA: GLIADIN (DEAMIDATED) AB, IGA (BIOFLASH): <20 CU (ref <20.0)

## 2023-01-29 LAB — CBC: MCH CONCENTRATION: 30.4 g/dL — ABNORMAL LOW (ref 31.5–35.5)

## 2023-01-29 LAB — Endomysial IgA: ENDOMYSIAL IGA ANTIBODY: <1:10 {titer}

## 2023-01-29 MED ORDER — CHOLECALCIFEROL 25 MCG (1000 UT) PO TABS
25 ug | ORAL_TABLET | Freq: Every day | ORAL | 0 refills
Start: 2023-01-29 — End: ?

## 2023-01-29 MED ORDER — FLUTICASONE PROPIONATE HFA 220 MCG/ACT IN AERO
1 | Freq: Every day | RESPIRATORY_TRACT | 0 refills
Start: 2023-01-29 — End: ?

## 2023-01-29 MED ADMIN — LIDOCAINE 5 % EX PTCH: 1 | TRANSDERMAL | @ 08:00:00 | Stop: 2023-02-26

## 2023-01-29 MED ADMIN — ACETAMINOPHEN 500 MG PO TABS: 1000 mg | ORAL | @ 21:00:00 | Stop: 2023-01-31 | NDC 00904673061

## 2023-01-29 MED ADMIN — DEXTROSE 5 %/LACTATED RINGERS: 100 mL/h | INTRAVENOUS | @ 01:00:00 | Stop: 2023-01-30

## 2023-01-29 MED ADMIN — ONDANSETRON HCL 4 MG/2ML IJ SOLN: 4 mg | INTRAVENOUS | @ 16:00:00 | Stop: 2023-01-29 | NDC 60505613000

## 2023-01-29 MED ADMIN — PANCRELIPASE (LIP-PROT-AMYL) 36000-114000 UNITS PO CPEP: 36000 [IU] | ORAL | @ 18:00:00 | Stop: 2023-02-04 | NDC 00032301613

## 2023-01-29 MED ADMIN — HYDROMORPHONE HCL 1 MG/ML IJ SOLN: .5 mg | INTRAVENOUS | @ 07:00:00 | Stop: 2023-01-29 | NDC 00409128331

## 2023-01-29 MED ADMIN — NICOTINE 14 MG/24HR TD PT24: 14 mg | TRANSDERMAL | @ 18:00:00 | Stop: 2023-02-04 | NDC 00536589588

## 2023-01-29 MED ADMIN — DEXTROSE 5 %/LACTATED RINGERS: 100 mL/h | INTRAVENOUS | @ 07:00:00 | Stop: 2023-01-30 | NDC 00338012504

## 2023-01-29 MED ADMIN — HYDROMORPHONE HCL 1 MG/ML IJ SOLN: .2 mg | INTRAVENOUS | @ 18:00:00 | Stop: 2023-02-26 | NDC 00409128331

## 2023-01-29 MED ADMIN — ACETAMINOPHEN 500 MG PO TABS: 1000 mg | ORAL | @ 08:00:00 | Stop: 2023-01-31

## 2023-01-29 MED ADMIN — HYDROMORPHONE HCL 1 MG/ML IJ SOLN: .5 mg | INTRAVENOUS | @ 16:00:00 | Stop: 2023-01-29 | NDC 00409128331

## 2023-01-29 MED ADMIN — PANTOPRAZOLE SODIUM 40 MG IV SOLR: 40 mg | INTRAVENOUS | @ 08:00:00 | Stop: 2023-01-29 | NDC 00781323295

## 2023-01-29 MED ADMIN — NITROGLYCERIN 0.4 % RE OINT: 1 [in_us] | RECTAL | @ 08:00:00 | Stop: 2023-02-04

## 2023-01-29 MED ADMIN — ZZ IMS TEMPLATE: 150 mg | ORAL | @ 07:00:00 | Stop: 2023-02-25 | NDC 60687034911

## 2023-01-29 MED ADMIN — ZZ IMS TEMPLATE: 150 mg | ORAL | @ 18:00:00 | Stop: 2023-02-04 | NDC 60687034911

## 2023-01-29 MED ADMIN — ACETAMINOPHEN 500 MG PO TABS: 1000 mg | ORAL | @ 14:00:00 | Stop: 2023-01-31

## 2023-01-29 MED ADMIN — PANCRELIPASE (LIP-PROT-AMYL) 36000-114000 UNITS PO CPEP: 36000 [IU] | ORAL | @ 04:00:00 | Stop: 2023-02-04

## 2023-01-29 MED ADMIN — CHOLESTYRAMINE 4 G PO PACK: 4 g | ORAL | @ 08:00:00 | Stop: 2023-02-04

## 2023-01-29 MED ADMIN — FLUTICASONE PROPIONATE HFA 220 MCG/ACT IN AERO: 1 | RESPIRATORY_TRACT | @ 21:00:00 | Stop: 2023-02-04

## 2023-01-29 MED ADMIN — ONDANSETRON HCL 4 MG/2ML IJ SOLN: 4 mg | INTRAVENOUS | @ 02:00:00 | Stop: 2023-01-29 | NDC 60505613000

## 2023-01-29 MED ADMIN — CHOLESTYRAMINE 4 G PO PACK: 4 g | ORAL | @ 21:00:00 | Stop: 2023-02-06 | NDC 68382052860

## 2023-01-29 MED ADMIN — OXYCODONE HCL 5 MG PO TABS: 10 mg | ORAL | @ 08:00:00 | Stop: 2023-01-29 | NDC 68084035411

## 2023-01-29 MED ADMIN — ONDANSETRON 4 MG PO TBDP: 4 mg | ORAL | @ 21:00:00 | Stop: 2023-01-29 | NDC 57237007710

## 2023-01-29 MED ADMIN — ONDANSETRON 4 MG PO TBDP: 4 mg | ORAL | @ 23:00:00 | Stop: 2023-01-29

## 2023-01-29 MED ADMIN — ENOXAPARIN SODIUM 40 MG/0.4ML IJ SOSY: 40 mg | SUBCUTANEOUS | @ 18:00:00 | Stop: 2023-02-28 | NDC 71288043382

## 2023-01-29 MED ADMIN — NITROGLYCERIN 0.4 % RE OINT: 1 [in_us] | RECTAL | @ 21:00:00 | Stop: 2023-02-26

## 2023-01-29 MED ADMIN — PANCRELIPASE (LIP-PROT-AMYL) 36000-114000 UNITS PO CPEP: 36000 [IU] | ORAL | @ 02:00:00 | Stop: 2023-02-25 | NDC 00032301613

## 2023-01-29 MED ADMIN — TRAZODONE HCL 100 MG PO TABS: 100 mg | ORAL | @ 07:00:00 | Stop: 2023-02-04 | NDC 60687045411

## 2023-01-29 MED ADMIN — OXYCODONE HCL 5 MG PO TABS: 10 mg | ORAL | @ 02:00:00 | Stop: 2023-01-29 | NDC 68084035411

## 2023-01-29 MED ADMIN — CHOLESTYRAMINE 4 G PO PACK: 4 g | ORAL | @ 14:00:00 | Stop: 2023-02-04

## 2023-01-29 MED ADMIN — ENOXAPARIN SODIUM 40 MG/0.4ML IJ SOSY: 40 mg | SUBCUTANEOUS | @ 18:00:00 | Stop: 2023-02-28

## 2023-01-29 MED ADMIN — OXYCODONE HCL 5 MG PO TABS: 15 mg | ORAL | @ 21:00:00 | Stop: 2023-01-31 | NDC 68084035411

## 2023-01-29 MED ADMIN — DULOXETINE HCL 30 MG PO CPEP: 30 mg | ORAL | @ 18:00:00 | Stop: 2023-02-24 | NDC 60687073411

## 2023-01-29 MED ADMIN — HYDROMORPHONE HCL 1 MG/ML IJ SOLN: .5 mg | INTRAVENOUS | @ 01:00:00 | Stop: 2023-01-29 | NDC 00409128331

## 2023-01-29 MED ADMIN — OXYCODONE HCL 5 MG PO TABS: 10 mg | ORAL | @ 16:00:00 | Stop: 2023-01-29 | NDC 68084035411

## 2023-01-29 MED ADMIN — HYDROMORPHONE HCL 1 MG/ML IJ SOLN: .2 mg | INTRAVENOUS | @ 23:00:00 | Stop: 2023-02-01 | NDC 00409128331

## 2023-01-29 MED ADMIN — VITAMIN D3 25 MCG (1000 UT) PO TABS: 25 ug | ORAL | @ 18:00:00 | Stop: 2023-02-04

## 2023-01-29 MED ADMIN — VITAMIN B-12 500 MCG PO TABS: 1000 ug | ORAL | @ 18:00:00 | Stop: 2023-02-04 | NDC 77333093725

## 2023-01-29 NOTE — Progress Notes
[facility] DEPARTMENT OF MEDICINE  INPATIENT NOTE   [facility]    PATIENT: Donna Gross  ADMISSION DATE: 01/25/2023    HOSPITAL DAY: 4  DATE OF SERVICE: 01/25/2023    Donna Gross is a 57 y.o. female who is admitted for Abdominal pain    Subjective Report  21F h/o anxiety, depression, fm, had a cholecystectomy 4/8 and has been struggling with abdominal pain since. Admitted for acute exacerbation and c/f pancreatitis.    She has had prior instrumentation of ERCP with empiric biliary stent early May but without help. She was seen by GI who will persue upper and lower endoscopy tomorrow, and recommended EW consult for pain mgmt.    Her pain is throughout the abodmen, both upper and peri-umbilical, associated with tension and ttp throughout. It is better with self-massage and heat does provide temporary relief.     She is not at all interested in acupuncture or trigger point injections or any other needles    Lipase 32 on admission    Interval History 01/29/2023  UGI with some gastric erythema but unable to prep for colo yesterday  Ongoing abdomianl pain, unchanged. Exam repeated, unchanged  Still not interested in acupuncture or trigger point injection       Objective (click to expand/collapse)     I reviewed these specialist notes that directly relate to the patient's acute and/or chronic medical problems with documentation of the salient findings, if any:     Last Gastroenterology Note           01/28/23 1152 Updated H&P Note Attestation signed by Donnel Saxon, MD           and      Last Hospitalist Note           01/28/23 1740 Progress Notes signed by Donnajean Lopes., MD              PRNs administered:  Medications 01/28/23 01/29/23      albuterol 90 mcg/act inh 2 puff  Dose: 2 puff  Freq: Every 6 hours PRN Route: IN  PRN Reasons: Wheezing,Shortness of Breath  Start: 01/25/23 1207   End: 02/01/23 1206    1456-MAR Hold     1650-MAR Unhold               aluminum-magnesium hydroxide-simethicone 400-400-40 mg/5 mL susp 15 mL  Dose: 15 mL  Freq: Every 6 hours PRN Route: PO  PRN Comment: Dyspepsia  Start: 01/25/23 1215   End: 02/24/23 1214    1456-MAR Hold     1650-MAR Unhold               HYDROmorphone 1 mg/mL inj 0.2 mg  Dose: 0.2 mg  Freq: Every 4 hours PRN Route: IV push  PRN Reasons: Severe Pain (Pain Scale 7-10),Breakthrough Pain  PRN Comment: BTP  Start: 01/29/23 0909   End: 02/25/23 1844     0956-Given               lidocaine 5% oint  Freq: Daily PRN Route: TOP  PRN Comment: anal fissure pain  Start: 01/26/23 2010   End: 02/25/23 2009    1456-MAR Hold     1650-MAR Unhold               lidocaine PF 1% inj 2 mL  Dose: 2 mL  Freq: Daily PRN Route: SC  PRN Comment: for MD administered trigger point injections  Start: 01/29/23 0947   End: 02/05/23  7829         metoclopramide 10 mg in dextrose 5% 50 mL IVPB  Dose: 10 mg  Freq: Every 8 hours PRN Route: IV  PRN Reasons: Vomiting,Nausea  Start: 01/27/23 1637   End: 02/26/23 0636    1456-MAR Hold     1650-MAR Unhold               oxyCODONE tab 10 mg  Dose: 10 mg  Freq: Every 6 hours PRN Route: PO  PRN Reason: Moderate Pain (Pain Scale 4-6)  Start: 01/29/23 0915   End: 02/01/23 0914        Or  oxyCODONE tab 15 mg  Dose: 15 mg  Freq: Every 6 hours PRN Route: PO  PRN Reason: Severe Pain (Pain Scale 7-10)  Start: 01/29/23 0915   End: 02/01/23 0914         sodium chloride 0.9% IV soln  Dose: 10 mL/hr  Freq: As needed for Route: IV  PRN Comment: compatible IVPB  Start: 01/28/23 1002   End: 02/27/23 1001   Order specific questions:   Nurse Protocol Document [email or web address] (Revised: 09-2020)      1059-New Bag/ Syringe/ Cartridge     1305-Stopped     1456-MAR Hold     1650-MAR Unhold             traZODone tab 100 mg  Dose: 100 mg  Freq: Every night at bedtime PRN Route: PO  PRN Comment: Sleep  Start: 01/25/23 1215   End: 02/24/23 1214    1456-MAR Hold     1650-MAR Unhold     2239-Given PHYSICAL EXAM    Temp:  [36.4 ?C (97.6 ?F)-37.2 ?C (98.9 ?F)] 36.8 ?C (98.2 ?F)  Heart Rate:  [59-90] 83  Resp:  [11-20] 17  BP: (88-127)/(63-89) 96/64  NBP Mean:  [74-99] 74  SpO2:  [93 %-100 %] 93 %   Weight: 45.4 kg (100 lb) Oxygen Therapy  SpO2: 93 %  O2 Device: None (Room air)    PHYSICAL EXAM:      System Check if normal Positive or additional negative findings   Constit  []  General appearance     Eyes  []  Conj/Lids []  Pupils  []  Fundi     HENMT  []  External ears/nose []  Otoscopy   []  Gross Hearing []  Nasal mucosa   []  Lips/teeth/gums []  Oropharynx    []  mucus membranes []  Head     Neck  []  Inspection/palpation []  Thyroid     Resp  []  Effort []  Wheezing    []  Auscultation  []  Crackles     CV  []  Rhythm/rate   []  Murmurs   []  LEE   []  JVP non-elevated    Normal pulses:   []  Radial []  Femoral  []  Pedal     Breast  []  Inspection []  Palpation     GI  []  abd masses    []  tenderness   []  rebound/guarding   []  Liver/spleen []  Rectal     GU  M: []  Scrotum []  Penis []  Prostate   F:  []  External []  vaginal wall        []  Cervix  []  mucus        []  Uterus    []  Adnexa      Lymph  []  Neck []  Axillae []  Groin     MSK Specify site examined:    []  Inspect/palp []  ROM   []  Stability []  Strength/tone  Scarring noted in abdomen,  including midline. Carnett's sign positive       Skin  []  Inspection []  Palpation     Neuro  []  CN2-12 intact grossly   []  Alert and oriented   []  DTR      []  Muscle strength      []  Sensation   []  Gait/balance     Psych  []  Insight/judgement     []  Mood/affect    []  Gross cognition            LABS/STUDIES    Lab Studies:    Lab Results   Component Value Date    WBC 5.94 01/29/2023    HGB 10.5 (L) 01/29/2023    HCT 34.5 (L) 01/29/2023    MCV 86.3 01/29/2023    PLT 295 01/29/2023     INR   Date Value Ref Range Status   01/27/2023 1.0 . Final     Comment:     Therapeutic Range: INR 2-3  Mechanical Valves: INR 2.5-3.5       INR (Outside Lab)   Date Value Ref Range Status   03/09/2021 0.90 Final     Prothrombin Time   Date Value Ref Range Status   01/27/2023 13.3 11.5 - 14.4 seconds Final     Prothrombin Time (Outside Lab)   Date Value Ref Range Status   03/09/2021 12.3  Final     APTT   Date Value Ref Range Status   04/08/2021 27.3 24.4 - 36.2 seconds Final     Comment:     aPTT based therapeutic range for unfractionated heparin therapy is 85.5-111.6 seconds.     Magnesium   Date Value Ref Range Status   01/28/2023 1.2 (L) 1.4 - 1.9 mEq/L Final         Imaging Studies:     all imaging in past 24 hours No imaging has been resulted in the last 24 hours     TREATMENTS PROVIDED TODAY  none         ASSESSMENT AND PLAN    Eugene Zeiders is a 57 y.o. female admitted for Abdominal pain    #abdominal pain - with onset following cholecystectomy and with subsequent instrumentation as well. Given positive Carnett's sign exam findings, some of her pain may be reactive (post-traumatic) myofascial pain. In this situation, her pain would persist despite negative imaging, lab and endoscopy findings. Reassuring upper gi but still pending colo.  -continue to recommended acupuncture and/or trigger point injections both to confirm dx and treat it, but these were declined  -recommend self-massage to the abdomen  -asked RN to order K-pad (abdominal heating pad)  -If remains hospitalized, will f/u next week and continue [facility] as appropriate  -in addition, can consider workup for porphyria, lead level, and celiac disease as it is unclear if these have been done yet     Will cont to follow    Thanks for involving [place] in the care of this patient      Author: Alric Quan 01/29/2023 12:40 PM  [facility] pager [phone]

## 2023-01-29 NOTE — Consults
NUTRITION ASSESSMENT (Adult)    Admit Date: 01/25/2023     Date of Birth: 1966-08-09 Gender: female MRN: [id number]     Date of Assessment: 01/29/2023    Consult and Assessment   Indication: BMI < or equal to 18.5   Subjective: Pt sitting up in bed with emesis bag, reported nausea but no vomiting at this time. S/p zofran. Reported poor appetite/intake, sips on Ensure slowly during the day but prefers [employer] (was out of stock but now available as of this morning).   Problems: Principal Problem:    Abdominal pain (POA: Yes)       Past Medical History:   Diagnosis Date    Anxiety     Depression     Emphysema, unspecified (HCC/RAF)     Emphysema, unspecified (HCC/RAF)     Fibromyalgia     Gastritis     GERD (gastroesophageal reflux disease)     History of blood transfusion     Intractable nausea and vomiting 01/23/2020    Pancreatitis     Past Surgical History:   Procedure Laterality Date    ABDOMINAL SURGERY      COLON SURGERY            Per H&P/MD notes: Donna Gross is a 57 y.o. female with hx of anxiety, depression, emphysema, fibromyalgia, gastritis, GERD, colon surgery and pancreatitis who presents to the ED with complaint of abdominal pain with associated nausea, poor PO intake and diarrhea x6 days.        Data   Intake/Outputs: I/O last 2 completed shifts:  In: 812.7 [I.V.:712.7; IV Piggyback:100]  Out: 500 [Urine:500]     Pertinent Medications: acetaminophen, 1,000 mg, Oral, TID  cholestyramine, 4 g, Oral, TID  cyanocobalamin, 1,000 mcg, Oral, Daily  DULoxetine, 30 mg, Oral, Daily  enoxaparin, 40 mg, Subcutaneous, Daily  fluticasone, 1 puff, Inhalation, Daily  lidocaine, 1 patch, Transdermal, Q24H  nicotine, 1 patch, Transdermal, Daily  nitroglycerin, 1 inch, Rectal, Q12H  ondansetron ODT, 4 mg, Oral, TID w/meals  pancrelipase (Lip-Prot-Amyl), 36,000 units of lipase, Oral, TID w/meals  [START ON 01/30/2023] pantoprazole, 40 mg, Oral, Daily  QUEtiapine, 150 mg, Oral, BID  vitamin B complex, 1 capsule, Oral, Daily  vitamin D (cholecalciferol), 25 mcg, Oral, Daily    FDI Target Drugs: No      Pertinent Labs:   Recent Labs     01/29/23  0621 01/28/23  0646   NA 140 141   K 4.5 4.3   BUN 9 7   CREAT 1.01 0.98   GLUCOSE 92 100*   MG  --  1.2*   CALCIUM 8.5* 8.7   WBC 5.94 7.81   HGB 10.5* 10.8*   HCT 34.5* 35.1   CHOL  --  148         Trended Labs     01/28/23  0646 04/22/22  0852   CRP  --  0.5   TRIGLY 93  --           No results for input(s): ''VITD25OH'', ''PTHINT'', ''VITAMINA'', ''VITAMINE'', ''VITAMINC'', ''VITAMINK'', ''VITAMINB1'', ''VITAMINB2'', ''VITAMINB3'', ''VITAMINB6'', ''VITAMINB12'', ''MEMALSER'', ''FOLATE'', ''FOLATERBC'', ''FE'', ''TIBC'', ''FEBINDSAT'', ''FERRITIN'', ''SELENIUM'', ''CUSER'', ''CURBC'', ''ZINCSER'' in the last 8760 hours.    Accu-Chek: No results found in last 72 hours    Respiratory Status / O2 Device: None (Room air)    Pressure Injury: none recorded    Additional data:   - S/p EGD 2/8 with GI: mild gastric erythema, otherwise normal endoscopy with normal  Billroth 2 anatomy per report  - Unable to complete bowel prep therefore colonoscopy not done     Diet Info   Allergies:   Hydrocodone, Ibuprofen, and Morphine  Cultural/Ethnic/Religious/Other Food Preferences:  None     Nutrition prior to admit:  Poor intake, one meal a day, using ONS 1-2 times per day  Current diet order:     Diets/Supplements/Feeds   Diet    Diet low fiber     Start Date/Time: 01/28/23 1700      Number of Occurrences: Until Specified     PO % consumed: 76 to 100% (per RN flowsheet however pt reported poor intake)  Parenteral/Enteral Nutrition: none     Anthropometrics:   Height: 162.6 cm (5' 4'')  Admit Weight: 46.1 kg (101 lb 10.1 oz) (01/25/23 0639)  Current Weight: 45.4 kg (100 lb 1.4 oz)  BMI: 17.18  IBW: 54 kg/120 lbs  IBW %: 84  Adj BW (if applicable): 52 kg  UBW:  (unable to determine, weight loss x 1 year)  UBW %:         Weight History (last 5)  Weights Weight   07/23/2022   6:29 AM 52.2 kg   11/24/2022   6:00 AM 49.2 kg   01/25/2023   6:39 AM 46.1 kg 01/26/2023   6:25 PM 45.8 kg   01/28/2023   3:00 PM 45.4 kg       Estimated Nutrition Needs   Based on wt: 46.1 kg  Calories: 35 - 40 cal/kg = 1613.5 - 1844 cal/day  Protein: 1.5 - 2 g pro/kg = 69.15 - 92.2 g pro/day      Diet Education   No diet education needs at this time         Malnutrition Assessment using AND/ASPEN Consensus Criteria   Malnutrition in the Context of: Severe inflammation, chronic illness    Energy Intake: < 75% of estimated energy requirement for >7 days; Supportive data: Pt reports poor intake, eating only 1 meal per day for more than one week   Weight Loss: > 7.5% in 3 months; Supportive data: 50lbs (33.1%) weight loss in 1 year as above      Nutrition-Focused Physical Exam: 01/26/2023  Subcutaneous Fat Loss: Moderate  Areas examined/observed: Orbital Upper, Arm  Muscle Loss: Severe  Areas examined/observed: Temple, Clavicle, Interosseous, Anterior Thigh, Patellar Region       Patient meets criteria for: Severe protein calorie malnutrition     Nutrition Assessment    Anthropometrics: Underweight BMI classification. Pt has been experiencing ongoing weight loss for 1 year.     Nutrition: 2/9: On low fiber diet. Poor appetite d/t abd pain and nausea. Sips on [employer] but prefers [employer].   2/6: Poor intake reported. Pt reports eating one meal a day due to abdominal pain. Will drink 1 to 2 oral nutrition supplements to receive additional nutrition. Pain preventing pt to tolerate all foods. No foods seem to be tolerable at this time. Hx of pancreatic lipase     Tolerance/GI: 2/9: Last BM Date: 01/28/23 per RN flowsheet (brown, small, watery). Cdiff negative. Pt on creon. No concern for fat malabsorption at this time. GI following.  2/6: Abdominal pain reported. Pt also reports diarrhea and states that it has been ongoing for several months. Per H&P, pt presents with diarrhea for six days.     Labs: TG WNL. Mg low, s/p repletion. Serum glucose controlled.    Micronutrients: No new labs. Currently on Vit B12, B  complex, and D3.     Skin: Intact     Recommendations / Care Plan      Continue with low fiber diet and cater to pt's preferences   Continue with current vitamin and mineral supplementation    - cyanocobalamin 1,79mcg PO daily   - vit B complex tab PO daily    - cholecalciferol PO daily   Will send Boost Breeze TID per pt request  Consider recheck Vit B1, Vit B12, and Vitamin D levels  Monitor Mg levels and replete as needed      Author:  Vick Frees, RD Pager [phone]  01/29/2023 1:40 PM       Nutrition will continue to follow per policy and as needed throughout hospital stay.

## 2023-01-29 NOTE — Progress Notes
INTERNAL MEDICINE - INPATIENT PROGRESS NOTE    ADMISSION DATE: 01/25/2023    DATE OF SERVICE: 01/28/2023  HOSPITAL DAY: 3    PRINCIPLE PROBLEM: Abdominal pain    CC: Abdominal Pain (Abdominal pain, vomiting and diarrhea x6 days Denies fever. )    INTERVAL EVENTS AND REVIEW OF SYSTEMS:  No acute event overnight. Patient unable to tolerate prep and stools not clear in AM.  Plan for EGD alone today     MEDICATIONS:  acetaminophen, 1,000 mg, Oral, TID  cholestyramine, 4 g, Oral, TID  cyanocobalamin, 1,000 mcg, Oral, Daily  DULoxetine, 30 mg, Oral, Daily  [START ON 01/29/2023] enoxaparin, 40 mg, Subcutaneous, Daily  fluticasone, 1 puff, Inhalation, Daily  lidocaine, 1 patch, Transdermal, Q24H  nicotine, 1 patch, Transdermal, Daily  nitroglycerin, 1 inch, Rectal, Q12H  ondansetron injection/IVPB, 4 mg, Intravenous, TID w/meals  pancrelipase (Lip-Prot-Amyl), 36,000 units of lipase, Oral, TID w/meals  pantoprazole, 40 mg, IV Push, Q12H  QUEtiapine, 150 mg, Oral, BID  vitamin B complex, 1 capsule, Oral, Daily  vitamin D (cholecalciferol), 25 mcg, Oral, Daily  PRNs: albuterol, aluminum-magnesium hydroxide-simethicone, HYDROmorphone, lidocaine, metoclopramide 10 mg in dextrose 5% 50 mL IVPB, oxyCODONE **OR** oxyCODONE, sodium chloride, traZODone    VITALS:  Temp:  [36.4 ?C (97.5 ?F)-37 ?C (98.6 ?F)] 36.7 ?C (98 ?F)  Heart Rate:  [59-83] 59  Resp:  [11-20] 11  BP: (88-127)/(63-80) 104/71  NBP Mean:  [77-94] 82  SpO2:  [92 %-99 %] 96 %     Weight: 45.4 kg (100 lb) Oxygen Therapy  SpO2: 96 %  O2 Device: None (Room air)     IN'S AND OUT'S:  I/O last 2 completed shifts:  In: 4370 [P.O.:1800; I.V.:2570]  Out: -     PHYSICAL EXAM:  General: alert, well appearing, and in no distress.   Cardiac: regular rate and rhythm, no murmurs.   Lungs: Clear to auscultation, normal work of breathing  Abdomen: soft, diffuse discomfort with palpation with worsened tenderness in epigastric region, nondistended  Neuro: fully oriented, no focal deficit    DATA  I have reviewed the following information from the last 24 hours: allied health and treating physician notes, imaging, labs and microbiology data, and cardiac studies and telemetry data (if on monitor)  Na/K/Cl/CO2/BUN/Cr/glu:  141/4.3/104/28/7/0.98/100 (02/08 0646)     WBC/Hgb/Hct/Plts:  7.81/10.8/35.1/314 (02/08 0646)            ASSESSMENT:  Donna Gross is a 57 y.o. female with hx of anxiety, depression, emphysema, fibromyalgia, gastritis, GERD, colon surgery and pancreatitis who presents to the ED with complaint of abdominal pain s/p abdominal surgery on 5/3. Pt had a cholecystectomy on 4/8, was treated for an infection, and admitted from 5/3-5/8 for ERCP and biliary stent placement who now presents with and is admitted with acute on chronic abdominal pain.     #Abdominal Pain, POA   #Diarrhea- POA, chronic  #hx of pancreatitis  #Severe protein calorie malnutrition, POA  Patient recently underwent CT imaging without acute pathologic findings  - ADAT   - mIVF at 100 cc/hr   - Tylenol/Oxy/Dilaudid for pain, prn topical lidocaine for rectal pain, nitroglycerin rectal ointment BID   - C/S to Nutrition Services   - Zofran 4 mg q8h TID prior to meals  - stool studies: Ova/Parasite Studies, bile acid panel, fecal elastase panel, calprotectin panel, bacterial enteropathogen panel, parasitic enteropathogen panel, h.pylori ag  - start protonix IV BID   - continue home pancrelipase (creon ) 36,000 u TID  with meals  - continue home cholestyramine 4g TID  - plan for EDG and colo on 2/8  - patient unable to tolerate prep for colonoscopy, will plan for outpatient   - EW med consult: concern for myofascial component to pain. Patient declined acupuncture/trigger point today, pending EDG     #AKI- improved  - Baseline creat likely 0.8-1.1; admission Creat 1.3  - suspect AKI in setting of poor PO intake/diarrhea  - mIVF to ensure appropriate fluid intake  - monitor BMP; avoid nephrotoxic agents     #Anxiety, POA #Depression, POA   #Fibromyalgia   #Insomnia,   - continue Seroquel 150mg  BID  - Continue duloxetine 40 mg qday   -Trazodone 100 mg at bedtime PRN   #Tobacco Dependence - Nicotine patch         INPATIENT CHECKLIST:  Diet: CLD, NPO in AM for procedure  VTE Prophylaxis: enoxaparin- hold for procedure   Central Lines: none  Tubes/Drains: none  Activity: as tolerated  Precautions: No infectious isolation,      ADVANCED DIRECTIVES:  Full Code, Primary Emergency Contact: [name],[name]     DISPOSITION:  Inpatient, Non-Monitored. Expected post-hospitalization disposition will be to home.    AUTHOR:  Monalisa Bayless M. Ninetta Lights, MD  01/28/2023 at 5:38 PM

## 2023-01-29 NOTE — Procedures
PATIENT NAME: Donna Gross, Donna Gross  DATE OF BIRTH: 08/06/1966  RECORD NUMBER: [id number]  DATE/TIME OF PROCEDURE: 01/28/2023 / 02:45:00 PM  ENDOSCOPIST: Lynnell Dike, MD  REFERRING PHYSICIAN:   FELLOW:     INDICATIONS FOR EXAMINATION: Abdominal pain, NV, weight loss               PROCEDURE PERFORMED: UPPER GI ENDOSCOPY - diagnostic  UPPER GI ENDOSCOPY - biopsy    MEDICATIONS:    MAC anesthesia    PROCEDURE TECHNIQUE:  Informed consent obtained for the procedure including risks, benefits and alternatives and the risk of those alternatives. Informed consent obtained for sedation including risks, benefits and alternatives and the risk of those alternatives.    EKG, pulse, pulse oximetry and blood pressure were monitored throughout the procedure.     The endoscope was passed with ease through the mouth under direct visualization; it was extended to the jejunum. The scope was withdrawn and the mucosa was carefully examined. The views were excellent. The patient's toleration of the procedure was   excellent.    Total Withdrawl Time:   Total Insertion Time:                              EXTENT OF EXAM:  jejunum            INSTRUMENTS:   #[id number] ([id number])  TECHNICAL DIFFICULTY:  No   LIMITATIONS:      none TOLERANCE:   Good  VISUALIZATION:  Good                                                         FINDINGS:   The esophagus and GE junction were normal.  The gastric pouch was mildly erythematous with bilious fluid noted.  Billroth 2 anatomy was noted.  The GJ anastamosis appeared normal. The efferent and afferent limbs were intubated a short distance and both   appeared normal.  Biopsy taken with cold biopsy forceps from the stomach.    ESTIMATED BLOOD LOSS:   None     DIAGNOSIS:  Mild gastric erythema, otherwise normal endoscopy with normal Billroth 2 anatomy.    RECOMMENDATIONS:  Follow-up biopsy results.            This electronic signature authenticates all electronic and/or handwritten documentation, including orders, generated by the signer during the episode of care contained in this record.  01/28/2023 04:26:59 PM By Lynnell Dike

## 2023-01-29 NOTE — Other
Patient's Clinical Goal:   Clinical Goal(s) for the Shift: VSS, safety, pain + nausea relief, GI procedure, comfort, rest, stool samples, IV fluids  Identify possible barriers to advancing the care plan: n/a  Stability of the patient: Moderately Unstable - medium risk of patient condition declining or worsening    Progression of Patient's Clinical Goal:      Problem/Goal: response to POC and/or changes in condition  Pt c/o SOB around 1000, states she has oxygen concentrator at home, SPO2 89% on RA, pt placed on 1L O2 per NC, spo2 now >96%  Bp 84/56, HR=66 around 1500. asymptomatic, encouraged her to have more PO fluid intake, pt tolerated PO well today   Pt denies any BM or diarrhea today, parasite stool sample pending collection  Nausea and abd pain managed with ATC and PRN pain meds  Interdisciplinary communication:   N/A  Psychosocial communication:  N/A  Plan and Disposition:  D/c home pending clinical improvement, will endorse to oncoming RN.

## 2023-01-29 NOTE — Consults
IP CM ACTIVE DISCHARGE PLANNING  Department of Care Coordination      Admit [phone]  Anticipated Date of Discharge: 01/31/2023    Following [id number], Venora Maples., MD      Today's short update     per medical team: GI consulting, pending GI recs. Anticipate DC home over the weekend NN.    Disposition     Home, No needs identified  [address]  [place] [id number]  Family/Support System in agreement with the current discharge plan: Yes, in agreement and participating            Lauraine Rinne,  01/29/2023

## 2023-01-29 NOTE — Other
Patient's Clinical Goal:   Clinical Goal(s) for the Shift: VSS, safety, pain + nausea relief, GI procedure, comfort, rest, stool samples, IV fluids  Identify possible barriers to advancing the care plan:   Stability of the patient: Moderately Unstable - medium risk of patient condition declining or worsening    Progression of Patient's Clinical Goal:   Problem/Goal:   Abdominal pain  Response to Plan of Care and any changes in condition (clinical condition, any concerning assessments, events, interventions; response to POC effectiveness or changes made):   Continues with abdominal pain being managed with minimal effect with oxy PO and IV dilaudid.  1 watery tan colored BM . Low fiber diet. On D5LR at 166m/hr. C diff negative. Additional stool sample results pending. EGD results pending.  Interdisciplinary communication (team member communication):   Psychosocial communication (family or patient issues potentially affecting care):   Plan and Disposition (progression toward specific goals of care/hospitalization and discharge):  Pain management, awaiting results, dc home when medically stable

## 2023-01-29 NOTE — Progress Notes
[facility] DEPARTMENT OF MEDICINE  DIVISION OF GASTROENTEROLOGY  GENERAL GI CONSULT PROGRESS NOTE    PATIENT: Donna Gross  MRN: [id number]  DOB: 05-Aug-1966    ADMISSION DATE: 01/25/2023    HOSPITAL DAY: 4  DATE OF SERVICE: 01/29/2023  PRIMARY TEAM ATTENDING: Donnajean Lopes., MD  REASON FOR CONSULT: Abdominal pain and diarrhea    HPI/SUBJECTIVE  Donna Gross is a 57 y.o. female with GERD, pancreatitis, anxiety/depression, fibromyalgia, perforated gastric ulcer s/p Billroth 2 surgery (2016), partial colectomy for colonic perforation (2015) with colostomy s/p reversal, gallstone pancreatitis s/p cholecystectomy (03/27/2022) with c/f post-operative bile leak s/p ERCP (04/22/22) with empiric plastic biliary stent placement and removal (06/2022) who presented with abdominal pain, nausea, poor PO intake and diarrhea for the past year.     See HPI from consult note dated 01/26/23 for further details.    Interval Events:  2/7: No further emesis since admission. Had 3 large loose bowel movements yesterday with incontinence. Provided stool sample, but for some reason wasn't sent to lab. Declined lidocaine patch, nitroglycerin rectal ointment, tylenol. Tolerating clear liquid diet. Amenable to trying colonoscopy prep this afternoon for EGD/colo tomorrow  2/8: Did not tolerate Golytely. EGD unremarkable, c/w Bilroth 2 anatomy, random biopsies taken for H pylori  2/9: Nausea better controlled on IV zofran, ongoing diarrhea and epigastric pain with IV/PO opioid use. Tolerating diet.       (click to expand/collapse)   Scheduled:  acetaminophen, 1,000 mg, Oral, TID  cholestyramine, 4 g, Oral, TID  cyanocobalamin, 1,000 mcg, Oral, Daily  DULoxetine, 30 mg, Oral, Daily  enoxaparin, 40 mg, Subcutaneous, Daily  fluticasone, 1 puff, Inhalation, Daily  lidocaine, 1 patch, Transdermal, Q24H  nicotine, 1 patch, Transdermal, Daily  nitroglycerin, 1 inch, Rectal, Q12H  ondansetron ODT, 4 mg, Oral, TID w/meals  pancrelipase (Lip-Prot-Amyl), 36,000 units of lipase, Oral, TID w/meals  [START ON 01/30/2023] pantoprazole, 40 mg, Oral, Daily  QUEtiapine, 150 mg, Oral, BID  vitamin B complex, 1 capsule, Oral, Daily  vitamin D (cholecalciferol), 25 mcg, Oral, Daily    PRN:  albuterol, aluminum-magnesium hydroxide-simethicone, HYDROmorphone, lidocaine, lidocaine PF, metoclopramide 10 mg in dextrose 5% 50 mL IVPB, oxyCODONE **OR** oxyCODONE, sodium chloride, traZODone    Infusions:   dextrose 5%/lactated ringers 100 mL/hr (01/28/23 2235)       PHYSICAL EXAM    Temp:  [36.4 ?C (97.6 ?F)-37.2 ?C (98.9 ?F)] 36.8 ?C (98.2 ?F)  Heart Rate:  [59-90] 83  Resp:  [11-20] 17  BP: (88-127)/(63-89) 96/64  NBP Mean:  [74-99] 74  SpO2:  [93 %-100 %] 93 %  BMI Readings from Last 1 Encounters:   01/28/23 17.16 kg/m?     Wt Readings from Last 10 Encounters:   01/28/23 1500 45.4 kg (100 lb)   01/26/23 1825 45.8 kg (101 lb)   01/25/23 0639 46.1 kg (101 lb 10.1 oz)   11/24/22 0600 49.2 kg (108 lb 7.5 oz)   07/23/22 0629 52.2 kg (115 lb)   07/01/22 1332 56.5 kg (124 lb 9 oz)   05/18/22 0734 57 kg (125 lb 10.6 oz)   05/07/22 0503 54.4 kg (120 lb 0.3 oz)   05/06/22 0423 54.5 kg (120 lb 4 oz)   05/02/22 0823 54.4 kg (120 lb)   04/21/22 2237 59.6 kg (131 lb 6.3 oz)   03/27/22 1055 64.4 kg (141 lb 15.6 oz)   02/13/22 2300 64.1 kg (141 lb 6.4 oz)   02/13/22 0846 67.6 kg (149 lb)   01/30/22 1210 68.9  kg (151 lb 12.8 oz)     General: resting comfortably in bed  Abdomen: soft, diffusely tender to light palpation, voluntary guarding, no rebound, +Carnett's with E/W attending      LABS/STUDIES    Labs Independently Interpreted by me today  Lab Results   Component Value Date    WBC 5.94 01/29/2023    HGB 10.5 (L) 01/29/2023    HCT 34.5 (L) 01/29/2023    MCV 86.3 01/29/2023    PLT 295 01/29/2023       Imaging Tests Independently Interpreted by me today   CTAP w/ contrast 01/25/23  1. No acute CT abnormality in the abdomen or pelvis. No significant interval change from prior CT.  2. Cholecystectomy with stable biliary dilatation which is likely physiologic for this patient given lack of interval change and normal cholestatic serum markers. If clinical concern for distal biliary obstruction MRCP may be considered.  3. Stable mild wall thickening of the cecum and proximal transverse colon likely exaggerated by under distention without surrounding fat stranding    Procedures  EGD 01/28/23 - dyspepsia, n/v  Mild gastric erythema, otherwise normal endoscopy with normal Billroth 2 anatomy.     ERCP 07/01/22 - Dr. Selena Batten - bile leak  1. No bile leak seen.   2. Removal of the previously placed plastic biliary stent.  3. Removal of biliary sludge and small stones from the CBD.  4. Anatomy consistent with Billroth 2.    ERCP 04/22/22 - Dr. Selena Batten - bile leak  DIAGNOSIS:  1. No obvious bile leak seen. But given clinical concern for bile leak following recent cholecystectomy, a 7 Fr biliary stent was placed.  2. Anatomy consistent with Billroth 2.     RECOMMENDATIONS:  1. Watch for complications including bleeding, perforation, and pancreatitis.  2. Consider cross sectional imaging to evaluate for any residual fluid collection from suspected bile leak.  3. Repeat ERCP 2-3 months for stent removal/exchange.    ERCP 04/08/21 - Dr. Chelsea Aus - choledocholithiasis  1. Successful ERCP in patient with Billroth II anatomy.  2. Choledocholithiasis, s/p sphincterotomy and balloon sweeps with duct clearance as above.       ASSESSMENT AND PLAN  Donna Gross is a 57 y.o. female with GERD, pancreatitis, anxiety/depression, fibromyalgia, perforated gastric ulcer s/p Billroth 2 surgery (2016), partial colectomy for colonic perforation (2015) with colostomy s/p reversal, gallstone pancreatitis s/p cholecystectomy (03/27/2022) with c/f post-operative bile leak s/p ERCP (04/22/22) with empiric plastic biliary stent placement and removal (06/2022) who presented with abdominal pain, nausea, poor PO intake and diarrhea for the past year.     #Nausea/vomiting  #Diffuse abdominal pain  #Diarrhea  #C/f anal fissure  Since her cholecystectomy 03/2022, she complains of constant abdominal pain, worsened with PO intake, c/b post-prandial nausea/vomiting (3 times daily) and diarrhea (5-15 times daily, also with nocturnal symptoms) and rectal pain with intermittent rectal bleeding c/f fissure. Definitely has some component of abdominal wall pain with pain overlying central surgical scar and +Carnett's. Also high concern for functional dyspepsia and gut hypersensitivity. Pending H pylori gastric biopsies. Query some dysmotility due to opioid use, though recognize that her opioid requirement only began after post-operative symptom onset. Lower c/f symptoms due to chronic pancreatitis due to relatively normal appearance of pancreas on imaging. Ruled out esophagitis on EGD, no e/o obstruction on repeated CTs due to multiple abdominal surgeries c/b adhesions (never had UGIS). Query microscopic colitis iso SNRI use versus bile acid diarrhea, though doesn't notice improvement of diarrhea  with cholestyramine use.       Recommendations  Work-up:  - Outpatient colonoscopy, could consider SIBO testing  - F/u gastric biopsies  - Nutrition consult  - [facility] consultation for pain  - F/u fecal fat, pancreatic elastase, calprotectin, bacterial/parasite panel, fecal bile acid    Management:  - PO zofran 4mg  1st line, PO compazine 2nd line for nausea  - Hyoscyamine q4h PRN  - Loperamide 2mg  q6h PRN  - PO pantoprazole 40mg  daily  - Lidocaine patch / heat to central abdominal scar  - Continue Creon 36K with meals   - Will consider increasing dose  - Consider weaning opioids as able, though patient was very upset and not amenable to this plan  - PRN topical lidocaine daily for rectal pain  - 0.4% nitroglycerin rectal ointment BID for possible anal fissure  - Outpatient will consider TCA initiation    - Refer for GI clinic f/u in 4 weeks     Code Status:  Full Code    Discussed with attending Dr. Carmie Kanner. We will sign off    I reviewed these specialist notes that directly related to the patient's acute and/or chronic medical problems with documentation of the salient findings, if any: hospitalist    I have spoken with the primary team regarding the patient's care today. Our conclusions were as above.      AuthorDorian Heckle Estell Puccini 01/29/2023 2:42 PM  GI Fellow PGY-4, pager [phone]  If after 5pm or on weekends, please page GI fellow on call [phone]

## 2023-01-30 LAB — CBC: ABSOLUTE NUCLEATED RBC COUNT: 0 10*3/uL (ref 0.00–0.00)

## 2023-01-30 LAB — Basic Metabolic Panel
CHLORIDE: 105 mmol/L (ref 96–106)
TOTAL CO2: 26 mmol/L (ref 20–30)

## 2023-01-30 LAB — Bacterial Enteric Pathogen Panel: SHIGA TOXIN: NOT DETECTED

## 2023-01-30 MED ORDER — CHOLECALCIFEROL 25 MCG (1000 UT) PO TABS
25 ug | ORAL_TABLET | Freq: Every day | ORAL | 0 refills
Start: 2023-01-30 — End: ?

## 2023-01-30 MED ORDER — ONDANSETRON 4 MG PO TBDP
4 mg | ORAL_TABLET | Freq: Four times a day (QID) | ORAL | 0 refills | 8.00000 days
Start: 2023-01-30 — End: ?

## 2023-01-30 MED ORDER — FLUTICASONE PROPIONATE HFA 220 MCG/ACT IN AERO
1 | Freq: Every day | RESPIRATORY_TRACT | 0 refills
Start: 2023-01-30 — End: ?

## 2023-01-30 MED ADMIN — ZZ IMS TEMPLATE: 150 mg | ORAL | @ 05:00:00 | Stop: 2023-02-04 | NDC 60687034911

## 2023-01-30 MED ADMIN — ENOXAPARIN SODIUM 40 MG/0.4ML IJ SOSY: 40 mg | SUBCUTANEOUS | @ 17:00:00 | Stop: 2023-02-04

## 2023-01-30 MED ADMIN — PANCRELIPASE (LIP-PROT-AMYL) 36000-114000 UNITS PO CPEP: 36000 [IU] | ORAL | @ 20:00:00 | Stop: 2023-02-25 | NDC 00032301613

## 2023-01-30 MED ADMIN — CHOLESTYRAMINE 4 G PO PACK: 4 g | ORAL | @ 04:00:00 | Stop: 2023-02-06

## 2023-01-30 MED ADMIN — HYDROMORPHONE HCL 1 MG/ML IJ SOLN: .2 mg | INTRAVENOUS | @ 14:00:00 | Stop: 2023-02-26 | NDC 00409128331

## 2023-01-30 MED ADMIN — DULOXETINE HCL 30 MG PO CPEP: 30 mg | ORAL | @ 17:00:00 | Stop: 2023-02-24 | NDC 60687073411

## 2023-01-30 MED ADMIN — NITROGLYCERIN 0.4 % RE OINT: 1 [in_us] | RECTAL | @ 18:00:00 | Stop: 2023-02-04

## 2023-01-30 MED ADMIN — HYDROMORPHONE HCL 1 MG/ML IJ SOLN: .2 mg | INTRAVENOUS | @ 23:00:00 | Stop: 2023-02-01 | NDC 00409128331

## 2023-01-30 MED ADMIN — TRAZODONE HCL 100 MG PO TABS: 100 mg | ORAL | @ 05:00:00 | Stop: 2023-02-04 | NDC 60687045411

## 2023-01-30 MED ADMIN — LIDOCAINE 5 % EX PTCH: 1 | TRANSDERMAL | @ 17:00:00 | Stop: 2023-02-04

## 2023-01-30 MED ADMIN — B COMPLEX PO CAPS: 1 | ORAL | @ 17:00:00 | Stop: 2023-02-04

## 2023-01-30 MED ADMIN — ACETAMINOPHEN 500 MG PO TABS: 1000 mg | ORAL | @ 20:00:00 | Stop: 2023-01-31

## 2023-01-30 MED ADMIN — PANCRELIPASE (LIP-PROT-AMYL) 36000-114000 UNITS PO CPEP: 36000 [IU] | ORAL | @ 02:00:00 | Stop: 2023-02-04 | NDC 00032301613

## 2023-01-30 MED ADMIN — ONDANSETRON 4 MG PO TBDP: 4 mg | ORAL | @ 20:00:00 | Stop: 2023-02-04 | NDC 57237007710

## 2023-01-30 MED ADMIN — ACETAMINOPHEN 500 MG PO TABS: 1000 mg | ORAL | @ 14:00:00 | Stop: 2023-01-31 | NDC 00904673061

## 2023-01-30 MED ADMIN — LIDOCAINE 5 % EX PTCH: 1 | TRANSDERMAL | @ 05:00:00 | Stop: 2023-02-26 | NDC 82347050505

## 2023-01-30 MED ADMIN — NITROGLYCERIN 0.4 % RE OINT: 1 [in_us] | RECTAL | @ 07:00:00 | Stop: 2023-02-26

## 2023-01-30 MED ADMIN — FLUTICASONE PROPIONATE HFA 220 MCG/ACT IN AERO: 1 | RESPIRATORY_TRACT | @ 17:00:00 | Stop: 2023-02-04

## 2023-01-30 MED ADMIN — NICOTINE 14 MG/24HR TD PT24: 14 mg | TRANSDERMAL | @ 18:00:00 | Stop: 2023-02-04 | NDC 00536589588

## 2023-01-30 MED ADMIN — ONDANSETRON 4 MG PO TBDP: 4 mg | ORAL | @ 02:00:00 | Stop: 2023-01-30 | NDC 57237007710

## 2023-01-30 MED ADMIN — HYDROMORPHONE HCL 1 MG/ML IJ SOLN: .2 mg | INTRAVENOUS | @ 03:00:00 | Stop: 2023-02-01 | NDC 00409128331

## 2023-01-30 MED ADMIN — LACTATED RINGERS IV BOLUS: 1000 mL | INTRAVENOUS | @ 20:00:00 | Stop: 2023-01-30 | NDC 00338011704

## 2023-01-30 MED ADMIN — OXYCODONE HCL 5 MG PO TABS: 15 mg | ORAL | @ 03:00:00 | Stop: 2023-01-31 | NDC 68084035411

## 2023-01-30 MED ADMIN — PANTOPRAZOLE SODIUM 40 MG PO TBEC: 40 mg | ORAL | @ 17:00:00 | Stop: 2023-01-31 | NDC 60687073609

## 2023-01-30 MED ADMIN — PANCRELIPASE (LIP-PROT-AMYL) 36000-114000 UNITS PO CPEP: 36000 [IU] | ORAL | @ 17:00:00 | Stop: 2023-02-25 | NDC 00032301613

## 2023-01-30 MED ADMIN — HYDROMORPHONE HCL 1 MG/ML IJ SOLN: .2 mg | INTRAVENOUS | @ 19:00:00 | Stop: 2023-02-01 | NDC 00409128331

## 2023-01-30 MED ADMIN — VITAMIN D3 25 MCG (1000 UT) PO TABS: 25 ug | ORAL | @ 17:00:00 | Stop: 2023-02-04

## 2023-01-30 MED ADMIN — CHOLESTYRAMINE 4 G PO PACK: 4 g | ORAL | @ 20:00:00 | Stop: 2023-02-04 | NDC 68382052860

## 2023-01-30 MED ADMIN — OXYCODONE HCL 5 MG PO TABS: 15 mg | ORAL | @ 03:00:00 | Stop: 2023-02-01 | NDC 68084035411

## 2023-01-30 MED ADMIN — OXYCODONE HCL 5 MG PO TABS: 15 mg | ORAL | @ 12:00:00 | Stop: 2023-01-31 | NDC 68084035411

## 2023-01-30 MED ADMIN — DEXTROSE 5 %/LACTATED RINGERS: 100 mL/h | INTRAVENOUS | @ 14:00:00 | Stop: 2023-01-30 | NDC 00338012504

## 2023-01-30 MED ADMIN — ACETAMINOPHEN 500 MG PO TABS: 1000 mg | ORAL | @ 05:00:00 | Stop: 2023-01-31 | NDC 00904673061

## 2023-01-30 MED ADMIN — CHOLESTYRAMINE 4 G PO PACK: 4 g | ORAL | @ 14:00:00 | Stop: 2023-02-04

## 2023-01-30 MED ADMIN — ZZ IMS TEMPLATE: 150 mg | ORAL | @ 17:00:00 | Stop: 2023-02-04 | NDC 60687034911

## 2023-01-30 MED ADMIN — ACETAMINOPHEN 500 MG PO TABS: 1000 mg | ORAL | @ 23:00:00 | Stop: 2023-01-31 | NDC 00904673061

## 2023-01-30 MED ADMIN — VITAMIN B-12 500 MCG PO TABS: 1000 ug | ORAL | @ 17:00:00 | Stop: 2023-02-25 | NDC 77333093725

## 2023-01-30 MED ADMIN — DEXTROSE 5 %/LACTATED RINGERS: 100 mL/h | INTRAVENOUS | @ 04:00:00 | Stop: 2023-01-30 | NDC 00338012504

## 2023-01-30 MED ADMIN — OXYCODONE HCL 5 MG PO TABS: 15 mg | ORAL | @ 18:00:00 | Stop: 2023-01-31 | NDC 68084035411

## 2023-01-30 MED ADMIN — B COMPLEX PO CAPS: 1 | ORAL | @ 02:00:00 | Stop: 2023-02-04

## 2023-01-30 NOTE — Other
Patient's Clinical Goal:   Clinical Goal(s) for the Shift: pain,nausea mgmnt, IVF, comfort, rest/sleep, VSS  Identify possible barriers to advancing the care plan: n/a  Stability of the patient: Moderately Unstable - medium risk of patient condition declining or worsening    Progression of Patient's Clinical Goal:     Problem/Goal: response to POC and/or changes in condition  Pt c/o SOB intermittently, remains on 1L O2 per NC, spo2 >96%  Bp 80/57, HR=65 around 1500. asymptomatic, pt received 1L LR bolus, last bp=96/65  Pt denies any BM or diarrhea today, parasite stool sample pending collection  abd pain managed with ATC and PRN pain meds  Interdisciplinary communication:   N/A  Psychosocial communication:  N/A  Plan and Disposition:  D/c home, pending clinical improvement, will endorse to oncoming RN.

## 2023-01-30 NOTE — Consults
CASE MANAGER ASSESSMENT      Admit AOZH:[id number]    Date of Initial CM Assessment: 01/29/2023    Problems: Principal Problem:    Abdominal pain (POA: Yes)       Past Medical History:   Diagnosis Date    Anxiety     Depression     Emphysema, unspecified (HCC/RAF)     Emphysema, unspecified (HCC/RAF)     Fibromyalgia     Gastritis     GERD (gastroesophageal reflux disease)     History of blood transfusion     Intractable nausea and vomiting 01/23/2020    Pancreatitis     Past Surgical History:   Procedure Laterality Date    ABDOMINAL SURGERY      COLON SURGERY            Primary Care Physician:Butler, Josem Kaufmann, MD  [phone]      NEEDS ASSESSMENT:     Level of Function Prior to Admit: Self Care/Indep. W ADLs    Primary Living Situation: Lives w/Family    Pre-admission Living Situation: Home/Apartment       Primary Support Systems: Children, Family members    Contact Name: Salway, Kendrick Phone Number: [phone]   Does the patient have a Family/Support System member participating in Discharge Planning?: Yes      Bathroom on Main Floor: Yes          Prior Treatments / Services: None       How often do you visit your doctor?: Semi-annual    Do you need information/education regarding your medical condition?: No         Were you hospitalized in the last 30 days?: No      DISCHARGE ASSESSMENT:     Projected Date of Discharge: 01/31/2023    Anticipated Complex D/C?: No    Projected Discharge to: Home    Discharge Address: [address]  [place] [address]    Projected Discharge Needs: None      Who is available to transport you upon discharge?: Family Transportation         SDOH     Within the past 12 months, has a lack of transportation kept you from medical appointments, meetings, work, or from getting things needed for daily living? : No  How hard is it for you to pay for the very basics like food, housing, medical care, and heating?: Not very hard  How hard is it for you to pay for prescriptions or medical bills?: Not very hard  In the past 12 months has the electric, gas, oil, or water company threatened to shut off services in your home?: No  In the last 12 months, was there a time when you were not able to pay the mortgage or rent on time?: No  In the last 12 months, how many places have you lived?: 1  In the last 12 months, was there a time when you did not have a steady place to sleep or slept in a shelter (including now)?: No           Donna Gross,  01/29/2023

## 2023-01-30 NOTE — Other
Patient's Clinical Goal:   Clinical Goal(s) for the Shift: pain,nausea mgmnt, IVF, comfort, rest/sleep, VSS  Identify possible barriers to advancing the care plan:   Stability of the patient: Moderately Stable - low risk of patient condition declining or worsening   Progression of Patient's Clinical Goal:     Problem: Abdominal pain and diarrhea  Goal: pain,nausea mgmnt, IVF, comfort, rest/sleep, VSS    Response to Plan of Care and any changes in condition (clinical condition, any concerning assessments, events, interventions; response to POC effectiveness or changes made):   PT in stable condition  On IVF d5 LR at 156m/hr  Cont on pain management  No n/v noted  No diarrhea noted  Kept comfortable at all times  Interdisciplinary communication (team member communication):   CM following poss dc home on 2/11 no needs  EDennehotsofollowing for pain management    Psychosocial communication (family or patient issues potentially affecting care):     Plan and Disposition (progression toward specific goals of care/hospitalization and discharge):  Outpatient colonoscopy  Pending stool sample  Refer to GI clinic in 4wks  Poss dc home on 2/11

## 2023-01-30 NOTE — Progress Notes
INTERNAL MEDICINE - INPATIENT PROGRESS NOTE    ADMISSION DATE: 01/25/2023    DATE OF SERVICE: 01/29/2023  HOSPITAL DAY: 4    PRINCIPLE PROBLEM: Abdominal pain    CC: Abdominal Pain (Abdominal pain, vomiting and diarrhea x6 days Denies fever. )    INTERVAL EVENTS AND REVIEW OF SYSTEMS:  No acute event overnight. Reports continued abdominal pain, and episodes of diarrhea with PO overnight  Per discussion will plan to convert medications to oral as tolerated     MEDICATIONS:  acetaminophen, 1,000 mg, Oral, TID  cholestyramine, 4 g, Oral, TID  cyanocobalamin, 1,000 mcg, Oral, Daily  DULoxetine, 30 mg, Oral, Daily  enoxaparin, 40 mg, Subcutaneous, Daily  fluticasone, 1 puff, Inhalation, Daily  lidocaine, 1 patch, Transdermal, Q24H  nicotine, 1 patch, Transdermal, Daily  nitroglycerin, 1 inch, Rectal, Q12H  pancrelipase (Lip-Prot-Amyl), 36,000 units of lipase, Oral, TID w/meals  [START ON 01/30/2023] pantoprazole, 40 mg, Oral, Daily  QUEtiapine, 150 mg, Oral, BID  vitamin B complex, 1 capsule, Oral, Daily  vitamin D (cholecalciferol), 25 mcg, Oral, Daily  PRNs: albuterol, aluminum-magnesium hydroxide-simethicone, HYDROmorphone, hyoscyamine, lidocaine, lidocaine PF, loperamide, ondansetron ODT, oxyCODONE **OR** oxyCODONE, prochlorperazine, sodium chloride, traZODone    VITALS:  Temp:  [36.4 ?C (97.6 ?F)-37.2 ?C (98.9 ?F)] 36.8 ?C (98.2 ?F)  Heart Rate:  [70-90] 70  Resp:  [17-20] 20  BP: (84-127)/(56-89) 91/59  NBP Mean:  [66-99] 71  SpO2:  [93 %-100 %] 96 %     Weight: 45.4 kg (100 lb) Oxygen Therapy  SpO2: 96 %  O2 Device: Nasal cannula  Flow Rate (L/min): 1 L/min     IN'S AND OUT'S:  I/O last 2 completed shifts:  In: 480 [P.O.:480]  Out: 2200 [Urine:2200]    PHYSICAL EXAM:  General: alert, well appearing, and in no distress.   Cardiac: regular rate and rhythm, no murmurs.   Lungs: Clear to auscultation, normal work of breathing  Abdomen: soft, diffuse discomfort with palpation with worsened tenderness in epigastric region, nondistended  Neuro: fully oriented, no focal deficit    DATA  I have reviewed the following information from the last 24 hours: allied health and treating physician notes, imaging, labs and microbiology data, and cardiac studies and telemetry data (if on monitor)  Na/K/Cl/CO2/BUN/Cr/glu:  140/4.5/105/25/9/1.01/92 (02/09 0454)     WBC/Hgb/Hct/Plts:  5.94/10.5/34.5/295 (02/09 0981)            ASSESSMENT:  Donna Gross is a 57 y.o. female with hx of anxiety, depression, emphysema, fibromyalgia, gastritis, GERD, colon surgery and pancreatitis who presents to the ED with complaint of abdominal pain s/p abdominal surgery on 5/3. Pt had a cholecystectomy on 4/8, was treated for an infection, and admitted from 5/3-5/8 for ERCP and biliary stent placement who now presents with and is admitted with acute on chronic abdominal pain.     #Abdominal Pain, POA   #Diarrhea- POA, chronic  #hx of pancreatitis  #Severe protein calorie malnutrition, POA  Patient recently underwent CT imaging without acute pathologic findings  - ADAT   - mIVF at 100 cc/hr   - Tylenol/Oxy/Dilaudid for pain, prn topical lidocaine for rectal pain, nitroglycerin rectal ointment BID   - C/S to Nutrition Services   - Zofran 4 mg q8h TID prior to meals  - stool studies: Ova/Parasite Studies, bile acid panel, fecal elastase panel, calprotectin panel, bacterial enteropathogen panel, parasitic enteropathogen panel, h.pylori ag  - start protonix IV BID > transition to PO  - continue home pancrelipase (creon ) 36,000  u TID with meals  - continue home cholestyramine 4g TID  - s/p EDG 2/8 without acute source, s/p bx  - patient unable to tolerate prep for colonoscopy, will plan for outpatient   - EW med consult: concern for myofascial component to pain. Patient declined acupuncture/trigger     #AKI- improved  - Baseline creat likely 0.8-1.1; admission Creat 1.3  - suspect AKI in setting of poor PO intake/diarrhea  - mIVF to ensure appropriate fluid intake  - monitor BMP; avoid nephrotoxic agents     #Anxiety, POA   #Depression, POA   #Fibromyalgia   #Insomnia,   - continue Seroquel 150mg  BID  - Continue duloxetine 40 mg qday   -Trazodone 100 mg at bedtime PRN   #Tobacco Dependence - Nicotine patch         INPATIENT CHECKLIST:  Diet: low fiber diet  VTE Prophylaxis: enoxaparin- hold for procedure   Central Lines: none  Tubes/Drains: none  Activity: as tolerated  Precautions: No infectious isolation,      ADVANCED DIRECTIVES:  Full Code, Primary Emergency Contact: [name],[name]     DISPOSITION:  Inpatient, Non-Monitored. Expected post-hospitalization disposition will be to home.    AUTHOR:  Danila Eddie M. Ninetta Lights, MD  01/29/2023 at 7:48 PM

## 2023-01-31 ENCOUNTER — Ambulatory Visit: Payer: PRIVATE HEALTH INSURANCE

## 2023-01-31 LAB — Basic Metabolic Panel
POTASSIUM: 4.7 mmol/L (ref 3.6–5.3)
POTASSIUM: 4.7 mmol/L (ref 3.6–5.3)

## 2023-01-31 LAB — CBC: NUCLEATED RBC%, AUTOMATED: 0 (ref 3.96–5.09)

## 2023-01-31 LAB — Blood Lactate: BLOOD LACTATE: 11 mg/dL (ref 5–18)

## 2023-01-31 MED ORDER — GABAPENTIN 100 MG PO CAPS
100 mg | ORAL_CAPSULE | Freq: Three times a day (TID) | ORAL | 0 refills
Start: 2023-01-31 — End: ?

## 2023-01-31 MED ORDER — PANTOPRAZOLE SODIUM 40 MG PO TBEC
40 mg | ORAL_TABLET | Freq: Two times a day (BID) | ORAL | 0 refills
Start: 2023-01-31 — End: ?

## 2023-01-31 MED ORDER — ONDANSETRON 4 MG PO TBDP
4 mg | ORAL_TABLET | Freq: Four times a day (QID) | ORAL | 0 refills | 8.00000 days
Start: 2023-01-31 — End: ?

## 2023-01-31 MED ORDER — CHOLECALCIFEROL 25 MCG (1000 UT) PO TABS
25 ug | ORAL_TABLET | Freq: Every day | ORAL | 0 refills
Start: 2023-01-31 — End: ?

## 2023-01-31 MED ORDER — FLUTICASONE PROPIONATE HFA 220 MCG/ACT IN AERO
1 | Freq: Every day | RESPIRATORY_TRACT | 0 refills
Start: 2023-01-31 — End: ?

## 2023-01-31 MED ORDER — METHOCARBAMOL 500 MG PO TABS
500 mg | ORAL_TABLET | Freq: Three times a day (TID) | ORAL | 0 refills | 17.00000 days
Start: 2023-01-31 — End: ?

## 2023-01-31 MED ADMIN — PANCRELIPASE (LIP-PROT-AMYL) 36000-114000 UNITS PO CPEP: 36000 [IU] | ORAL | @ 20:00:00 | Stop: 2023-02-25 | NDC 00032301613

## 2023-01-31 MED ADMIN — HYDROMORPHONE HCL 1 MG/ML IJ SOLN: .2 mg | INTRAVENOUS | @ 12:00:00 | Stop: 2023-02-01 | NDC 00409128331

## 2023-01-31 MED ADMIN — ONDANSETRON 4 MG PO TBDP: 4 mg | ORAL | @ 20:00:00 | Stop: 2023-02-04 | NDC 57237007710

## 2023-01-31 MED ADMIN — PANCRELIPASE (LIP-PROT-AMYL) 36000-114000 UNITS PO CPEP: 36000 [IU] | ORAL | @ 18:00:00 | Stop: 2023-02-04

## 2023-01-31 MED ADMIN — NITROGLYCERIN 0.4 % RE OINT: 1 [in_us] | RECTAL | @ 07:00:00 | Stop: 2023-02-04

## 2023-01-31 MED ADMIN — CHOLESTYRAMINE 4 G PO PACK: 4 g | ORAL | @ 14:00:00 | Stop: 2023-02-04

## 2023-01-31 MED ADMIN — CHOLESTYRAMINE 4 G PO PACK: 4 g | ORAL | @ 20:00:00 | Stop: 2023-02-06 | NDC 68382052860

## 2023-01-31 MED ADMIN — LIDOCAINE 5 % EX PTCH: 1 | TRANSDERMAL | @ 18:00:00 | Stop: 2023-02-04

## 2023-01-31 MED ADMIN — FLUTICASONE PROPIONATE HFA 220 MCG/ACT IN AERO: 1 | RESPIRATORY_TRACT | @ 18:00:00 | Stop: 2023-02-04 | NDC 66993008096

## 2023-01-31 MED ADMIN — ONDANSETRON 4 MG PO TBDP: 4 mg | ORAL | @ 07:00:00 | Stop: 2023-02-04 | NDC 57237007710

## 2023-01-31 MED ADMIN — LIDOCAINE 5 % EX PTCH: 1 | TRANSDERMAL | @ 04:00:00 | Stop: 2023-02-26 | NDC 00591352530

## 2023-01-31 MED ADMIN — ACETAMINOPHEN 500 MG PO TABS: 1000 mg | ORAL | @ 20:00:00 | Stop: 2023-02-04 | NDC 00904673061

## 2023-01-31 MED ADMIN — ALUM & MAG HYDROXIDE-SIMETH 400-400-40 MG/5ML PO SUSP: 15 mL | ORAL | @ 04:00:00 | Stop: 2023-02-04 | NDC 00121176230

## 2023-01-31 MED ADMIN — METHOCARBAMOL 500 MG PO TABS: 500 mg | ORAL | @ 20:00:00 | Stop: 2023-02-04 | NDC 60687055911

## 2023-01-31 MED ADMIN — HYOSCYAMINE SULFATE 0.125 MG PO TABS: 125 ug | ORAL | @ 20:00:00 | Stop: 2023-02-04

## 2023-01-31 MED ADMIN — VITAMIN B-12 500 MCG PO TABS: 1000 ug | ORAL | @ 18:00:00 | Stop: 2023-02-25 | NDC 77333093725

## 2023-01-31 MED ADMIN — ACETAMINOPHEN 500 MG PO TABS: 1000 mg | ORAL | @ 04:00:00 | Stop: 2023-02-04

## 2023-01-31 MED ADMIN — ZZ IMS TEMPLATE: 150 mg | ORAL | @ 18:00:00 | Stop: 2023-02-04 | NDC 60687034911

## 2023-01-31 MED ADMIN — OXYCODONE HCL 5 MG PO TABS: 15 mg | ORAL | @ 14:00:00 | Stop: 2023-01-31 | NDC 68084035411

## 2023-01-31 MED ADMIN — OXYCODONE HCL 5 MG PO TABS: 15 mg | ORAL | @ 01:00:00 | Stop: 2023-01-31 | NDC 68084035411

## 2023-01-31 MED ADMIN — ACETAMINOPHEN 500 MG PO TABS: 1000 mg | ORAL | @ 14:00:00 | Stop: 2023-02-04 | NDC 00904673061

## 2023-01-31 MED ADMIN — PANCRELIPASE (LIP-PROT-AMYL) 36000-114000 UNITS PO CPEP: 36000 [IU] | ORAL | @ 01:00:00 | Stop: 2023-02-04 | NDC 00032301613

## 2023-01-31 MED ADMIN — TRAZODONE HCL 100 MG PO TABS: 100 mg | ORAL | @ 04:00:00 | Stop: 2023-02-24 | NDC 60687045411

## 2023-01-31 MED ADMIN — HYDROMORPHONE HCL 1 MG/ML IJ SOLN: .2 mg | INTRAVENOUS | @ 20:00:00 | Stop: 2023-02-01 | NDC 00409128331

## 2023-01-31 MED ADMIN — OXYCODONE HCL 5 MG PO TABS: 15 mg | ORAL | @ 07:00:00 | Stop: 2023-01-31 | NDC 68084035411

## 2023-01-31 MED ADMIN — VITAMIN D3 25 MCG (1000 UT) PO TABS: 25 ug | ORAL | @ 18:00:00 | Stop: 2023-02-05

## 2023-01-31 MED ADMIN — GABAPENTIN 100 MG PO CAPS: 100 mg | ORAL | @ 18:00:00 | Stop: 2023-02-04 | NDC 60687058011

## 2023-01-31 MED ADMIN — ONDANSETRON 4 MG PO TBDP: 4 mg | ORAL | @ 20:00:00 | Stop: 2023-05-09

## 2023-01-31 MED ADMIN — LACTATED RINGERS IV BOLUS: 1000 mL | INTRAVENOUS | @ 05:00:00 | Stop: 2023-01-31 | NDC 00338011704

## 2023-01-31 MED ADMIN — ONDANSETRON 4 MG PO TBDP: 4 mg | ORAL | @ 01:00:00 | Stop: 2023-02-04 | NDC 57237007710

## 2023-01-31 MED ADMIN — HYOSCYAMINE SULFATE 0.125 MG PO TABS: 125 ug | ORAL | @ 20:00:00 | Stop: 2023-05-10 | NDC 42192034001

## 2023-01-31 MED ADMIN — HYOSCYAMINE SULFATE 0.125 MG PO TABS: 125 ug | ORAL | @ 04:00:00 | Stop: 2023-01-31 | NDC 42192034001

## 2023-01-31 MED ADMIN — ZZ IMS TEMPLATE: 150 mg | ORAL | @ 04:00:00 | Stop: 2023-02-04 | NDC 60687034911

## 2023-01-31 MED ADMIN — PANTOPRAZOLE SODIUM 40 MG PO TBEC: 40 mg | ORAL | @ 04:00:00 | Stop: 2023-02-04 | NDC 60687073609

## 2023-01-31 MED ADMIN — B COMPLEX PO CAPS: 1 | ORAL | @ 18:00:00 | Stop: 2023-02-04

## 2023-01-31 MED ADMIN — NITROGLYCERIN 0.4 % RE OINT: 1 [in_us] | RECTAL | @ 20:00:00 | Stop: 2023-02-04

## 2023-01-31 MED ADMIN — HYDROMORPHONE HCL 1 MG/ML IJ SOLN: .2 mg | INTRAVENOUS | @ 16:00:00 | Stop: 2023-02-01 | NDC 00409128331

## 2023-01-31 MED ADMIN — ENOXAPARIN SODIUM 40 MG/0.4ML IJ SOSY: 40 mg | SUBCUTANEOUS | @ 18:00:00 | Stop: 2023-02-04

## 2023-01-31 MED ADMIN — DULOXETINE HCL 30 MG PO CPEP: 30 mg | ORAL | @ 18:00:00 | Stop: 2023-02-04 | NDC 60687073411

## 2023-01-31 MED ADMIN — HYDROMORPHONE HCL 1 MG/ML IJ SOLN: .2 mg | INTRAVENOUS | @ 06:00:00 | Stop: 2023-02-26 | NDC 00409128331

## 2023-01-31 MED ADMIN — ENOXAPARIN SODIUM 40 MG/0.4ML IJ SOSY: 40 mg | SUBCUTANEOUS | @ 18:00:00 | Stop: 2023-02-04 | NDC 71288043382

## 2023-01-31 MED ADMIN — PANTOPRAZOLE SODIUM 40 MG PO TBEC: 40 mg | ORAL | @ 20:00:00 | Stop: 2023-02-04

## 2023-01-31 MED ADMIN — ONDANSETRON 4 MG PO TBDP: 4 mg | ORAL | @ 14:00:00 | Stop: 2023-02-04 | NDC 57237007710

## 2023-01-31 MED ADMIN — CHOLESTYRAMINE 4 G PO PACK: 4 g | ORAL | @ 04:00:00 | Stop: 2023-02-04

## 2023-01-31 MED ADMIN — NICOTINE 14 MG/24HR TD PT24: 14 mg | TRANSDERMAL | @ 18:00:00 | Stop: 2023-02-04 | NDC 00536589588

## 2023-01-31 NOTE — Progress Notes
INTERNAL MEDICINE - INPATIENT PROGRESS NOTE    ADMISSION DATE: 01/25/2023    DATE OF SERVICE: 01/30/2023  HOSPITAL DAY: 5    PRINCIPLE PROBLEM: Abdominal pain    CC: Abdominal Pain (Abdominal pain, vomiting and diarrhea x6 days Denies fever. )    INTERVAL EVENTS AND REVIEW OF SYSTEMS:  Patient states no longer having diarrhea but still having a lot of abdominal pain and nausea. Open to scheduling antiemetics. Hypotensive on exam but asymptomatic.    MEDICATIONS:  acetaminophen, 1,000 mg, Oral, TID  cholestyramine, 4 g, Oral, TID  cyanocobalamin, 1,000 mcg, Oral, Daily  DULoxetine, 30 mg, Oral, Daily  enoxaparin, 40 mg, Subcutaneous, Daily  fluticasone, 1 puff, Inhalation, Daily  lidocaine, 1 patch, Transdermal, Q24H  nicotine, 1 patch, Transdermal, Daily  nitroglycerin, 1 inch, Rectal, Q12H  ondansetron ODT, 4 mg, Oral, Q6H  pancrelipase (Lip-Prot-Amyl), 36,000 units of lipase, Oral, TID w/meals  pantoprazole, 40 mg, Oral, Daily  QUEtiapine, 150 mg, Oral, BID  vitamin B complex, 1 capsule, Oral, Daily  vitamin D (cholecalciferol), 25 mcg, Oral, Daily  PRNs: albuterol, aluminum-magnesium hydroxide-simethicone, HYDROmorphone, hyoscyamine, lidocaine, lidocaine PF, loperamide, oxyCODONE **OR** oxyCODONE, prochlorperazine, sodium chloride, traZODone    VITALS:  Temp:  [36.3 ?C (97.4 ?F)-36.8 ?C (98.2 ?F)] 36.4 ?C (97.5 ?F)  Heart Rate:  [70-88] 77  Resp:  [16-20] 17  BP: (80-109)/(57-71) 96/65  NBP Mean:  [63-79] 77  SpO2:  [93 %-96 %] 93 %     Weight: 45.4 kg (100 lb) Oxygen Therapy  SpO2: 93 %  O2 Device: None (Room air)  Flow Rate (L/min): 1 L/min     IN'S AND OUT'S:  I/O last 2 completed shifts:  In: 1720 [P.O.:720; I.V.:1000]  Out: 2700 [Urine:2700]    PHYSICAL EXAM:  General: alert, well appearing, and in no distress.   Cardiac: regular rate and rhythm, no murmurs.   Lungs: Clear to auscultation, normal work of breathing  Abdomen: soft, diffuse discomfort with palpation with worsened tenderness in epigastric region, nondistended  Neuro: fully oriented, no focal deficit    DATA  I have reviewed the following information from the last 24 hours: allied health and treating physician notes, imaging, labs and microbiology data, and cardiac studies and telemetry data (if on monitor)  Na/K/Cl/CO2/BUN/Cr/glu:  139/4.2/105/26/10/0.92/91 (02/10 1610)     WBC/Hgb/Hct/Plts:  5.86/8.9/29.3/279 (02/10 9604)            ASSESSMENT:  Donna Gross is a 57 y.o. female with hx of anxiety, depression, emphysema, fibromyalgia, gastritis, GERD, colon surgery and pancreatitis who presents to the ED with complaint of abdominal pain s/p abdominal surgery on 5/3. Pt had a cholecystectomy on 4/8, was treated for an infection, and admitted from 5/3-5/8 for ERCP and biliary stent placement who now presents with and is admitted with acute on chronic abdominal pain.     #Abdominal Pain, POA   #Diarrhea- POA, chronic  #hx of pancreatitis  #Severe protein calorie malnutrition, POA  Patient recently underwent CT imaging without acute pathologic findings. C diff, H pylori, celiac studies negative.  - Tylenol/Oxy/Dilaudid for pain, prn topical lidocaine for rectal pain, nitroglycerin rectal ointment BID   - C/S to Nutrition Services   - Zofran 4 mg q6h  - stool studies: f/u calprotection, pancreatic elastase  - PPI BID  - continue home pancrelipase (creon ) 36,000 u TID with meals  - continue home cholestyramine 4g TID  - s/p EDG 2/8 without acute source, s/p bx  - patient unable to tolerate  prep for colonoscopy, will plan for outpatient   - EW med consult: concern for myofascial component to pain. Patient declined acupuncture/trigger     #AKI- improved  - Baseline creat likely 0.8-1.1; admission Creat 1.3  - suspect AKI in setting of poor PO intake/diarrhea  - mIVF to ensure appropriate fluid intake  - monitor BMP; avoid nephrotoxic agents     #Anxiety, POA   #Depression, POA   #Fibromyalgia   #Insomnia,   - continue Seroquel 150mg  BID  - Continue duloxetine 40 mg qday   -Trazodone 100 mg at bedtime PRN     #Tobacco Dependence - Nicotine patch         INPATIENT CHECKLIST:  Diet: low fiber diet  VTE Prophylaxis: enoxaparin  Central Lines: none  Tubes/Drains: none  Activity: as tolerated  Precautions: No infectious isolation,      ADVANCED DIRECTIVES:  Full Code, Primary Emergency Contact: [name],[name]     DISPOSITION:  Inpatient, Non-Monitored. Expected post-hospitalization disposition will be to home.    AUTHOR:  Miguel Dibble, MD  01/30/2023 at 4:56 PM    51 minutes were spent personally by me today on this encounter which include today's pre-visit review of the chart, obtaining appropriate history, performing an evaluation, documentation and discussion of management with details supported within the note for today's visit. The time documented was exclusive of any time spent on the separately billed procedure.

## 2023-01-31 NOTE — Progress Notes
INTERNAL MEDICINE - INPATIENT PROGRESS NOTE    ADMISSION DATE: 01/25/2023    DATE OF SERVICE: 01/31/2023  HOSPITAL DAY: 6    PRINCIPLE PROBLEM: Abdominal pain    CC: Abdominal Pain (Abdominal pain, vomiting and diarrhea x6 days Denies fever. )    INTERVAL EVENTS AND REVIEW OF SYSTEMS:  Patient continues to have abdominal pain. Had 1 BM yesterday. Feels that standing zofran is helpful. Open to trying other pain medication. Pain is mostly post-prandial. Also intermittently hypotensive but asymptomatic, lactate normal.    MEDICATIONS:  acetaminophen, 1,000 mg, Oral, TID  cholestyramine, 4 g, Oral, TID  cyanocobalamin, 1,000 mcg, Oral, Daily  DULoxetine, 30 mg, Oral, Daily  enoxaparin, 40 mg, Subcutaneous, Daily  fluticasone, 1 puff, Inhalation, Daily  gabapentin, 100 mg, Oral, TID  hyoscyamine, 125 mcg, Oral, Q4H  lidocaine, 1 patch, Transdermal, Q24H  methocarbamol, 500 mg, Oral, TID  nicotine, 1 patch, Transdermal, Daily  nitroglycerin, 1 inch, Rectal, Q12H  ondansetron ODT, 4 mg, Oral, Q6H  pancrelipase (Lip-Prot-Amyl), 36,000 units of lipase, Oral, TID w/meals  pantoprazole, 40 mg, Oral, BID  QUEtiapine, 150 mg, Oral, BID  vitamin B complex, 1 capsule, Oral, Daily  vitamin D (cholecalciferol), 25 mcg, Oral, Daily  PRNs: albuterol, aluminum-magnesium hydroxide-simethicone, HYDROmorphone, lidocaine, lidocaine PF, loperamide, oxyCODONE **OR** oxyCODONE, prochlorperazine, sodium chloride, traZODone    VITALS:  Temp:  [36.4 ?C (97.5 ?F)-36.8 ?C (98.3 ?F)] 36.6 ?C (97.8 ?F)  Heart Rate:  [71-83] 83  Resp:  [16-18] 18  BP: (85-110)/(44-72) 90/61  NBP Mean:  [71-85] 71  SpO2:  [92 %-97 %] 92 %     Weight: 45.4 kg (100 lb) Oxygen Therapy  SpO2: (!) 92 %  O2 Device: Nasal cannula  Flow Rate (L/min): 1 L/min     IN'S AND OUT'S:  I/O last 2 completed shifts:  In: 1800 [P.O.:800; IV Piggyback:1000]  Out: 600 [Urine:600]    PHYSICAL EXAM:  General: alert, well appearing, and in no distress.   Lungs: normal work of breathing  Abdomen: soft, diffuse discomfort with palpation, non distended  Neuro: fully oriented, no focal deficit    DATA  I have reviewed the following information from the last 24 hours: allied health and treating physician notes, imaging, labs and microbiology data, and cardiac studies and telemetry data (if on monitor)  Na/K/Cl/CO2/BUN/Cr/glu:  141/4.7/104/29/10/1.05/98 (02/11 0456)     WBC/Hgb/Hct/Plts:  6.15/9.3/30.4/290 (02/11 0456)            ASSESSMENT:  Donna Gross is a 57 y.o. female with hx of anxiety, depression, emphysema, fibromyalgia, gastritis, GERD, colon surgery and pancreatitis who presents to the ED with complaint of abdominal pain s/p abdominal surgery on 5/3. Pt had a cholecystectomy on 4/8, was treated for an infection, and admitted from 5/3-5/8 for ERCP and biliary stent placement who now presents with and is admitted with acute on chronic abdominal pain.     #Abdominal Pain, POA   #Diarrhea- POA, chronic  #hx of pancreatitis  #Severe protein calorie malnutrition, POA  Patient recently underwent CT imaging without acute pathologic findings. C diff, H pylori, celiac studies negative.  - Tylenol/Oxy/Dilaudid for pain, prn topical lidocaine for rectal pain, nitroglycerin rectal ointment BID   - C/S to Nutrition Services   - Zofran 4 mg q6h  - stool studies: f/u calprotection, pancreatic elastase  - PPI BID  - continue home pancrelipase (creon ) 36,000 u TID with meals  - continue home cholestyramine 4g TID  - s/p EDG 2/8 without acute  source, s/p bx - f/u biopsy results  - CTA ordered to evaluate for mesenteric ischemia given abd pain is mostly post-prandial  - patient unable to tolerate prep for colonoscopy, will plan for outpatient   - EW med consult: concern for myofascial component to pain. Patient declined acupuncture/trigger  Pain control   - tylenol 1000 TID    - gabapentin 100 TID - added 2/11   - methocarbamol 500 TID - added 2/11   - oxy 10/15 q4h mod/severe pain    - dilaudid 0.2 IV q4h PRN breakthrough    #AKI- improved  - Baseline creat likely 0.8-1.1; admission Creat 1.3  - suspect AKI in setting of poor PO intake/diarrhea  - monitor BMP; avoid nephrotoxic agents     #Anxiety, POA   #Depression, POA   #Fibromyalgia   #Insomnia,   - continue Seroquel 150mg  BID  - Continue duloxetine 40 mg qday   -Trazodone 100 mg at bedtime PRN     #Tobacco Dependence - Nicotine patch         INPATIENT CHECKLIST:  Diet: low fiber diet  VTE Prophylaxis: enoxaparin  Central Lines: none  Tubes/Drains: none  Activity: as tolerated  Precautions: No infectious isolation,      ADVANCED DIRECTIVES:  Full Code, Primary Emergency Contact: [name],[name]     DISPOSITION:  Inpatient, Non-Monitored. Expected post-hospitalization disposition will be to home.    AUTHOR:  Miguel Dibble, MD  01/31/2023 at 12:16 PM    51 minutes were spent personally by me today on this encounter which include today's pre-visit review of the chart, obtaining appropriate history, performing an evaluation, documentation and discussion of management with details supported within the note for today's visit. The time documented was exclusive of any time spent on the separately billed procedure.

## 2023-01-31 NOTE — Other
Patient's Clinical Goal:   Clinical Goal(s) for the Shift: adequate pain relief without narcotic side effect, comfort, safety  Identify possible barriers to advancing the care plan: Pain control  Stability of the patient: Moderately Stable - low risk of patient condition declining or worsening   Progression of Patient's Clinical Goal:   Problem/Goal:   Abd pain  Response to Plan of Care and any changes in condition (clinical condition, any concerning assessments, events, interventions; response to POC effectiveness or changes made):   Generalized abdominal pain managed with PRN Oxycodone 15 mg x 2 and BTP Dilaudid 0.2 mg x 2 - patient verbalized understanding that meds are not ATC  Desats to 85% on RA managed w/ O2 /NC 1L - patient verbalized understanding about getting an MD order for O2  Labile BP asymptomatic MD aware - had bolus last night  Observed fall precaution refused bed alarm    Interdisciplinary communication (team member communication):       Psychosocial communication (family or patient issues potentially affecting care):       Plan and Disposition (progression toward specific goals of care/hospitalization and discharge):  Pain control

## 2023-01-31 NOTE — Other
Patient's Clinical Goal:   Clinical Goal(s) for the Shift: adequate pain relief without narcotic side effect, comfort, safety  Identify possible barriers to advancing the care plan:   Stability of the patient: Moderately Unstable - medium risk of patient condition declining or worsening    Progression of Patient's Clinical Goal:       Problem/Goals:  Admit Dx: Abdominal pain 3 Days Post-Op   History:   has a past medical history of Anxiety, Depression, Emphysema, unspecified (HCC/RAF), Emphysema, unspecified (HCC/RAF), Fibromyalgia, Gastritis, GERD (gastroesophageal reflux disease), History of blood transfusion, Intractable nausea and vomiting (01/23/2020), and Pancreatitis.;  has a past surgical history that includes Abdominal surgery and Colon surgery.    Plan of Care/ Response to outcomes:   Order follow through:   Precautions:   Precautions: High Fall Risk  BMAT: Level 4 - Green Fall: 45  Assistance: Activity: Ambulation to bathroom   Level of Assistance: Supervised   Monitoring: SP02 []$   ETCO2 []$  Cardiac [x]$   Non-monitored   CTA completed this shift-Pending results  Pain managed well    Change of status or condition:   Vitals Stable: Stable  Last Recorded Vital Signs:    01/31/23 1740   BP: 101/67   Pulse: 75   Resp: (P) 16   Temp: (P) 36.4 C (97.6 F)   SpO2: 94%     WBC/Hgb/Hct/Plts:  6.15/9.3/30.4/290 (02/11 [id number])  Na/K/Cl/CO2/BUN/Cr/glu:  141/4.7/104/29/10/1.05/98 (02/11 0456)      Interdisciplinary communication: (Communication with staff)  []$ Primary Care Provider: Sandre Kitty, MD  []$ Attending Physician: Flora Lipps., MD  []$ Physician Team: Jerilynn Mages  []$ Other:  [x]$ N/A    Psychosocial communication:   Cooperative with care     Problems/goals Progression:   Ongoing  Boost 8Ps # refer to Boost Note for progress

## 2023-02-01 LAB — B-Type Natriuretic Peptide: BNP: 215 pg/mL — ABNORMAL HIGH (ref <100)

## 2023-02-01 LAB — Basic Metabolic Panel
CREATININE: 1.1 mg/dL (ref 0.60–1.30)
UREA NITROGEN: 12 mg/dL (ref 7–22)

## 2023-02-01 LAB — Parasite Enteric Pathogen Panel: ENTAMOEBA HISTOLYTICA PCR: NOT DETECTED

## 2023-02-01 LAB — CBC: NUCLEATED RBC%, AUTOMATED: 0 (ref 31.5–35.5)

## 2023-02-01 MED ADMIN — METHOCARBAMOL 500 MG PO TABS: 500 mg | ORAL | @ 05:00:00 | Stop: 2023-02-04 | NDC 60687055911

## 2023-02-01 MED ADMIN — VITAMIN B-12 500 MCG PO TABS: 1000 ug | ORAL | @ 16:00:00 | Stop: 2023-02-04 | NDC 77333093725

## 2023-02-01 MED ADMIN — OXYCODONE HCL 5 MG PO TABS: 15 mg | ORAL | @ 01:00:00 | Stop: 2023-02-01

## 2023-02-01 MED ADMIN — ONDANSETRON 4 MG PO TBDP: 4 mg | ORAL | @ 03:00:00 | Stop: 2023-02-04

## 2023-02-01 MED ADMIN — PANTOPRAZOLE SODIUM 40 MG PO TBEC: 40 mg | ORAL | @ 16:00:00 | Stop: 2023-02-04 | NDC 60687073609

## 2023-02-01 MED ADMIN — METHOCARBAMOL 500 MG PO TABS: 500 mg | ORAL | @ 20:00:00 | Stop: 2023-02-04 | NDC 50268052011

## 2023-02-01 MED ADMIN — NICOTINE 14 MG/24HR TD PT24: 14 mg | TRANSDERMAL | @ 16:00:00 | Stop: 2023-02-04 | NDC 00536589588

## 2023-02-01 MED ADMIN — GABAPENTIN 100 MG PO CAPS: 100 mg | ORAL | @ 05:00:00 | Stop: 2023-02-04 | NDC 60687058011

## 2023-02-01 MED ADMIN — CHOLESTYRAMINE 4 G PO PACK: 4 g | ORAL | @ 13:00:00 | Stop: 2023-02-04 | NDC 68382052860

## 2023-02-01 MED ADMIN — POLYETHYLENE GLYCOL 3350 17 G PO PACK: 17 g | ORAL | @ 16:00:00 | Stop: 2023-02-04

## 2023-02-01 MED ADMIN — ACETAMINOPHEN 500 MG PO TABS: 1000 mg | ORAL | @ 20:00:00 | Stop: 2023-02-04 | NDC 00904673061

## 2023-02-01 MED ADMIN — CHOLESTYRAMINE 4 G PO PACK: 4 g | ORAL | @ 20:00:00 | Stop: 2023-02-07 | NDC 68382052860

## 2023-02-01 MED ADMIN — HYOSCYAMINE SULFATE 0.125 MG PO TABS: 125 ug | ORAL | @ 16:00:00 | Stop: 2023-02-04 | NDC 42192034001

## 2023-02-01 MED ADMIN — LIDOCAINE 5 % EX PTCH: 1 | TRANSDERMAL | @ 05:00:00 | Stop: 2023-02-04 | NDC 82347050505

## 2023-02-01 MED ADMIN — NITROGLYCERIN 0.4 % RE OINT: 1 [in_us] | RECTAL | @ 18:00:00 | Stop: 2023-02-04

## 2023-02-01 MED ADMIN — GABAPENTIN 100 MG PO CAPS: 100 mg | ORAL | @ 13:00:00 | Stop: 2023-02-04 | NDC 00904666561

## 2023-02-01 MED ADMIN — FUROSEMIDE 10 MG/ML IJ SOLN: 20 mg | INTRAVENOUS | @ 23:00:00 | Stop: 2023-02-01 | NDC 71288020302

## 2023-02-01 MED ADMIN — PANCRELIPASE (LIP-PROT-AMYL) 36000-114000 UNITS PO CPEP: 36000 [IU] | ORAL | @ 16:00:00 | Stop: 2023-02-04 | NDC 00032301613

## 2023-02-01 MED ADMIN — HYOSCYAMINE SULFATE 0.125 MG PO TABS: 125 ug | ORAL | @ 23:00:00 | Stop: 2023-02-04

## 2023-02-01 MED ADMIN — DULOXETINE HCL 30 MG PO CPEP: 30 mg | ORAL | @ 16:00:00 | Stop: 2023-02-24 | NDC 60687073411

## 2023-02-01 MED ADMIN — HYOSCYAMINE SULFATE 0.125 MG PO TABS: 125 ug | ORAL | @ 05:00:00 | Stop: 2023-02-04 | NDC 42192034001

## 2023-02-01 MED ADMIN — ACETAMINOPHEN 500 MG PO TABS: 1000 mg | ORAL | @ 13:00:00 | Stop: 2023-02-04 | NDC 00904673061

## 2023-02-01 MED ADMIN — TRAZODONE HCL 100 MG PO TABS: 100 mg | ORAL | @ 05:00:00 | Stop: 2023-02-04 | NDC 60687045411

## 2023-02-01 MED ADMIN — HYDROMORPHONE HCL 1 MG/ML IJ SOLN: .2 mg | INTRAVENOUS | @ 05:00:00 | Stop: 2023-02-01 | NDC 00409128331

## 2023-02-01 MED ADMIN — PANCRELIPASE (LIP-PROT-AMYL) 36000-114000 UNITS PO CPEP: 36000 [IU] | ORAL | @ 03:00:00 | Stop: 2023-02-04

## 2023-02-01 MED ADMIN — METHOCARBAMOL 500 MG PO TABS: 500 mg | ORAL | @ 13:00:00 | Stop: 2023-02-04 | NDC 60687055911

## 2023-02-01 MED ADMIN — CHOLESTYRAMINE 4 G PO PACK: 4 g | ORAL | @ 05:00:00 | Stop: 2023-02-04 | NDC 68382052860

## 2023-02-01 MED ADMIN — HYOSCYAMINE SULFATE 0.125 MG PO TABS: 125 ug | ORAL | Stop: 2023-02-04 | NDC 42192034001

## 2023-02-01 MED ADMIN — OXYCODONE HCL 5 MG PO TABS: 15 mg | ORAL | @ 16:00:00 | Stop: 2023-02-01 | NDC 68084035411

## 2023-02-01 MED ADMIN — ATORVASTATIN CALCIUM 40 MG PO TABS: 80 mg | ORAL | @ 20:00:00 | Stop: 2023-02-04 | NDC 68084009911

## 2023-02-01 MED ADMIN — LIDOCAINE 5 % EX PTCH: 1 | TRANSDERMAL | @ 16:00:00 | Stop: 2023-02-26

## 2023-02-01 MED ADMIN — HYDROMORPHONE HCL 1 MG/ML IJ SOLN: .2 mg | INTRAVENOUS | Stop: 2023-02-26 | NDC 00409128331

## 2023-02-01 MED ADMIN — OXYCODONE HCL 5 MG PO TABS: 15 mg | ORAL | @ 08:00:00 | Stop: 2023-02-01 | NDC 68084035411

## 2023-02-01 MED ADMIN — VITAMIN D3 25 MCG (1000 UT) PO TABS: 25 ug | ORAL | @ 16:00:00 | Stop: 2023-02-04

## 2023-02-01 MED ADMIN — ONDANSETRON 4 MG PO TBDP: 4 mg | ORAL | @ 20:00:00 | Stop: 2023-05-09 | NDC 57237007710

## 2023-02-01 MED ADMIN — ZZ IMS TEMPLATE: 150 mg | ORAL | @ 05:00:00 | Stop: 2023-02-04 | NDC 60687034911

## 2023-02-01 MED ADMIN — ZZ IMS TEMPLATE: 150 mg | ORAL | @ 16:00:00 | Stop: 2023-02-04 | NDC 60687034911

## 2023-02-01 MED ADMIN — GABAPENTIN 100 MG PO CAPS: 100 mg | ORAL | @ 01:00:00 | Stop: 2023-02-04

## 2023-02-01 MED ADMIN — HYOSCYAMINE SULFATE 0.125 MG PO TABS: 125 ug | ORAL | @ 13:00:00 | Stop: 2023-05-10 | NDC 42192034001

## 2023-02-01 MED ADMIN — ACETAMINOPHEN 500 MG PO TABS: 1000 mg | ORAL | @ 05:00:00 | Stop: 2023-05-10 | NDC 00904673061

## 2023-02-01 MED ADMIN — FLUTICASONE PROPIONATE HFA 220 MCG/ACT IN AERO: 1 | RESPIRATORY_TRACT | @ 16:00:00 | Stop: 2023-02-04 | NDC 66993008096

## 2023-02-01 MED ADMIN — OXYCODONE HCL 5 MG PO TABS: 15 mg | ORAL | @ 01:00:00 | Stop: 2023-02-01 | NDC 68084035411

## 2023-02-01 MED ADMIN — OXYCODONE HCL 5 MG PO TABS: 15 mg | ORAL | @ 21:00:00 | Stop: 2023-02-04 | NDC 68084035411

## 2023-02-01 MED ADMIN — GABAPENTIN 100 MG PO CAPS: 100 mg | ORAL | @ 20:00:00 | Stop: 2023-02-04 | NDC 00904666561

## 2023-02-01 MED ADMIN — NITROGLYCERIN 0.4 % RE OINT: 1 [in_us] | RECTAL | @ 05:00:00 | Stop: 2023-02-04

## 2023-02-01 MED ADMIN — ONDANSETRON 4 MG PO TBDP: 4 mg | ORAL | @ 13:00:00 | Stop: 2023-02-04 | NDC 57237007710

## 2023-02-01 MED ADMIN — IOHEXOL 350 MG/ML IV SOLN: 100 mL | INTRAVENOUS | @ 01:00:00 | Stop: 2023-02-01 | NDC 00407141491

## 2023-02-01 MED ADMIN — ENOXAPARIN SODIUM 40 MG/0.4ML IJ SOSY: 40 mg | SUBCUTANEOUS | @ 16:00:00 | Stop: 2023-02-28

## 2023-02-01 MED ADMIN — PANCRELIPASE (LIP-PROT-AMYL) 36000-114000 UNITS PO CPEP: 36000 [IU] | ORAL | @ 20:00:00 | Stop: 2023-02-04 | NDC 00032301613

## 2023-02-01 MED ADMIN — HYDROMORPHONE HCL 1 MG/ML IJ SOLN: .2 mg | INTRAVENOUS | @ 18:00:00 | Stop: 2023-02-01 | NDC 00409128331

## 2023-02-01 MED ADMIN — B COMPLEX PO CAPS: 1 | ORAL | @ 16:00:00 | Stop: 2023-02-25

## 2023-02-01 MED ADMIN — HYOSCYAMINE SULFATE 0.125 MG PO TABS: 125 ug | ORAL | @ 08:00:00 | Stop: 2023-05-10 | NDC 42192034001

## 2023-02-01 MED ADMIN — PANTOPRAZOLE SODIUM 40 MG PO TBEC: 40 mg | ORAL | @ 05:00:00 | Stop: 2023-05-10 | NDC 60687073609

## 2023-02-01 MED ADMIN — HYDROMORPHONE HCL 1 MG/ML IJ SOLN: .2 mg | INTRAVENOUS | @ 13:00:00 | Stop: 2023-02-01 | NDC 00409128331

## 2023-02-01 MED ADMIN — ONDANSETRON 4 MG PO TBDP: 4 mg | ORAL | @ 08:00:00 | Stop: 2023-02-04 | NDC 57237007710

## 2023-02-01 NOTE — Consults
INPATIENT CONSULT/ GENERAL SURGERY / [facility] [phone]   PATIENT:  Donna Gross  MRN:  [id number]  DOB:  [birth year]  DATE OF SERVICE:  02/01/2023    GENERAL SURGERY ATTENDING PHYSICIAN:  Dr. Keene Breath  REFERRING PHYSICIAN:  Maque, Micah Flesher., MD    4 Days Post-Op   LOS: 7 days     REASON FOR CONSULT: Acute vs Chronic Mesenteric Ischemia    HPI:  Carlin Madero is a 57 y.o. female w PMHx COPD, colonic perforation s/p colostomy  s/p reversal, PUD c/b gastric outlet obstruction s/p multiple surgeries including laparscopic vagotomy, antrectomy and gastrojejunostomy (Dr. Prentice Docker), cholelithiasis, and gallstone pancreatitis who presents with acute on chronic abdominal pain.     Pt was seen by GI, EGD was done with no significant findings. Colonoscopy was planned but pt could not tolerate Bowel prep and was rescheduled to outpatient. Given the patient's post prandial pain, a CTA was ordered and found to have Patent mesenteric vasculature with moderate-severe narrowing of the superior mesenteric artery origin, and mild narrowing at the celiac origin.     Pt states she has chronic abdominal pain, n/v with emesis ~3x/day associated with PO intake and her pain is worsened by eating. Has baseline diarrhea which she states has worsened recently with worsened PO intake and endorses as the reason for her presentation to the ED, but no acute changes in her pain. States it started in April 2023 and has gotten slowly worse over time with no acute changes. Pt reports 50 lbs of weight loss over the last year.      Pt currently remains admitted for pain management & diuresis given concern for volume overload. N/V has been well controlled with PRN medications.       ALLERGIES:   Allergies   Allergen Reactions    Hydrocodone Other (See Comments)     ''burns a hole in stomach''  Tolerates hydromorphone    Ibuprofen Other (See Comments)     GI discomfort    ''hurts my stomach''    Morphine Itching     Tolerates hydromorphone MEDICATIONS:  Current Facility-Administered Medications   Medication Dose Route Frequency    [EXPIRED] acetaminophen tab 1,000 mg  1,000 mg Oral TID    acetaminophen tab 1,000 mg  1,000 mg Oral TID    albuterol 90 mcg/act inh 2 puff  2 puff Inhalation Q6H PRN    aluminum-magnesium hydroxide-simethicone 400-400-40 mg/5 mL susp 15 mL  15 mL Oral Q6H PRN    atorvastatin tab 80 mg  80 mg Oral Daily    cholestyramine pwd packet 4 g  4 g Oral TID    cyanocobalamin tab 1,000 mcg  1,000 mcg Oral Daily    DULoxetine DR cap 30 mg  30 mg Oral Daily    enoxaparin 40 mg/0.4 mL inj 40 mg  40 mg Subcutaneous Daily    fluticasone 220 mcg/act inh 1 puff  1 puff Inhalation Daily    gabapentin cap 100 mg  100 mg Oral TID    HYDROmorphone 1 mg/mL inj 0.2 mg  0.2 mg IV Push Q4H PRN    [COMPLETED] HYDROmorphone 1 mg/mL inj 0.5 mg  0.5 mg IV Push STAT    hyoscyamine tab 125 mcg  125 mcg Oral Q4H    [COMPLETED] iohexol (Omnipaque) 350 mg/mL inj 100 mL  100 mL Intravenous Once    [COMPLETED] lactated ringers IV soln bolus 1,000 mL  1,000 mL Intravenous Once    [COMPLETED] lactated ringers  IV soln bolus 1,000 mL  1,000 mL Intravenous Once    [COMPLETED] lactated ringers IV soln bolus 500 mL  500 mL Intravenous Once    lidocaine 5% oint   Topical Daily PRN    lidocaine 5% patch 1 patch  1 patch Transdermal Q24H    lidocaine PF 1% inj 2 mL  2 mL Subcutaneous Daily PRN    loperamide cap 2 mg  2 mg Oral QID PRN    [COMPLETED] magnesium sulfate 4 g in water for injection 100 mL RTU  4 g Intravenous Once    methocarbamol tab 500 mg  500 mg Oral TID    nicotine 14 mg/24 hr patch 14 mg  1 patch Transdermal Daily    nitroglycerin 0.4 % rectal oint 1 inch  1 inch Rectal Q12H    ondansetron ODT tab disolv 4 mg  4 mg Oral Q6H    oxyCODONE tab 10 mg  10 mg Oral Q6H PRN    Or    oxyCODONE tab 15 mg  15 mg Oral Q6H PRN    pancrelipase (Lip-Prot-Amyl) (Creon) DR cap 36,000 units of lipase  36,000 units of lipase Oral TID w/meals    pantoprazole DR tab 40 mg  40 mg Oral BID    [COMPLETED] PEG 3350/electrolytes soln 4,000 mL  4,000 mL Oral Once    polyethylene glycol pwd pkt 17 g  17 g Oral Daily    prochlorperazine tab 10 mg  10 mg Oral Q6H PRN    QUEtiapine tab 150 mg  150 mg Oral BID    senna tab 1 tablet  1 tablet Oral QHS    [COMPLETED] sodium chloride 0.9% IV soln bolus 500 mL  500 mL Intravenous Once    sodium chloride 0.9% IV soln  10 mL/hr Intravenous PRN    traZODone tab 100 mg  100 mg Oral QHS PRN    vitamin B complex cap 1 capsule  1 capsule Oral Daily    vitamin D (cholecalciferol) tab 25 mcg  25 mcg Oral Daily    [DISCONTINUED] acetaminophen tab 1,000 mg  1,000 mg Oral TID    [DISCONTINUED] acetaminophen tab 1,000 mg  1,000 mg Oral TID    [DISCONTINUED] acetaminophen tab 650 mg  650 mg Oral Q6H PRN    [DISCONTINUED] dextrose 5%/lactated ringers IV soln  100 mL/hr Intravenous Continuous    [DISCONTINUED] enoxaparin 40 mg/0.4 mL inj 40 mg  40 mg Subcutaneous Q24H    [DISCONTINUED] furosemide 10 mg/mL inj 20 mg  20 mg IV Push Once    [DISCONTINUED] HYDROmorphone 1 mg/mL inj 0.5 mg  0.5 mg IV Push Q6H PRN    [DISCONTINUED] HYDROmorphone 1 mg/mL inj 0.5 mg  0.5 mg IV Push Q4H PRN    [DISCONTINUED] hyoscyamine tab 125 mcg  125 mcg Oral Q4H PRN    [DISCONTINUED] metoclopramide 10 mg in dextrose 5% 50 mL IVPB  10 mg Intravenous Q8H PRN    [DISCONTINUED] metoclopramide 10 mg in dextrose 5% 50 mL IVPB  10 mg Intravenous Q8H PRN    [DISCONTINUED] ondansetron 4 mg/2 mL inj 4 mg  4 mg Intravenous Q8H PRN    [DISCONTINUED] ondansetron 4 mg/2 mL inj 4 mg  4 mg Intravenous TID w/meals    [DISCONTINUED] ondansetron ODT tab disolv 4 mg  4 mg Oral TID w/meals    [DISCONTINUED] ondansetron ODT tab disolv 4 mg  4 mg Oral Q8H PRN    [DISCONTINUED] ondansetron tab 4 mg  4 mg Oral Q6H PRN    [DISCONTINUED] ondansetron tab 4 mg  4 mg Oral Q8H PRN    [DISCONTINUED] oxyCODONE tab 10 mg  10 mg Oral Q4H PRN    [DISCONTINUED] oxyCODONE tab 10 mg  10 mg Oral Q4H PRN [DISCONTINUED] oxyCODONE tab 10 mg  10 mg Oral Q6H PRN    [DISCONTINUED] oxyCODONE tab 15 mg  15 mg Oral Q4H PRN    [DISCONTINUED] oxyCODONE tab 15 mg  15 mg Oral Q6H PRN    [DISCONTINUED] oxyCODONE tab 5 mg  5 mg Oral Q4H PRN    [DISCONTINUED] oxyCODONE tab 5 mg  5 mg Oral Q4H PRN    [DISCONTINUED] pantoprazole DR tab 40 mg  40 mg Oral Daily    [DISCONTINUED] pantoprazole DR tab 40 mg  40 mg Oral Daily    [DISCONTINUED] pantoprazole inj 40 mg  40 mg IV Push Q12H    [DISCONTINUED] pantoprazole inj 40 mg  40 mg IV Push Q12H    [DISCONTINUED] PEG 3350/electrolytes soln 4,000 mL  4,000 mL Oral Once    [DISCONTINUED] PEG 3350/electrolytes soln 4,000 mL  4,000 mL Oral Once     Facility-Administered Medications Ordered in Other Encounters   Medication Dose Route Frequency    [DISCONTINUED] lidocaine (Cardiac) 100 mg/5 mL inj   Intravenous PRN    [DISCONTINUED] propofol 200 mg/20 mL inj   Intravenous PRN    [DISCONTINUED] propofol 200 mg/20 mL inj   Intravenous Continuous PRN    [DISCONTINUED] sodium chloride 0.9% IV soln   Intravenous Continuous PRN       PAST MEDICAL HISTORY:   Past Medical History:   Diagnosis Date    Anxiety     Depression     Emphysema, unspecified (HCC/RAF)     Emphysema, unspecified (HCC/RAF)     Fibromyalgia     Gastritis     GERD (gastroesophageal reflux disease)     History of blood transfusion     Intractable nausea and vomiting 01/23/2020    Pancreatitis        PAST SURGICAL HISTORY:  Past Surgical History:   Procedure Laterality Date    ABDOMINAL SURGERY      COLON SURGERY         FAMILY HISTORY:  Family History   Problem Relation Age of Onset    Heart disease Mother     Diabetes Brother     Diabetes Father     Anesthesia problems Neg Hx     Malignant hypertension Neg Hx     Hypotension Neg Hx     Malignant hyperthermia Neg Hx     Pseudochol deficiency Neg Hx        SOCIAL HISTORY:  Social History     Tobacco Use    Smoking status: Every Day     Years: 20     Types: Cigarettes    Smokeless tobacco: Current    Tobacco comments:     1 cig per day   Substance Use Topics    Alcohol use: No    Drug use: No       REVIEW OF SYSTEMS: A 14 point review of systems has been completed and are negative, except for what is noted in the HPI.     PHYSICAL EXAM:  Vital Signs:  Temp:  [36.5 ?C (97.7 ?F)-36.6 ?C (97.9 ?F)] 36.5 ?C (97.7 ?F)  Heart Rate:  [64-83] 65  Resp:  [16-18] 16  BP: (90-132)/(61-81) 103/73  NBP Mean:  [  71-96] 83  SpO2:  [92 %-97 %] 97 %  Vitals:    01/28/23 1500   Weight: 100 lb (45.4 kg)   Height: 5' 4'' (1.626 m)         GENERAL: Comfortable, NAD.  HEENT: Normocephalic, atraumatic, conjunctiva/corneas clear, moist mucous membranes, no oral ulcerations/lesions.  CARDIOVASCULAR: Regular rate and rhythm, no ectopy on monitor.  PULMONARY: Normal respiratory excursions, not tachypneic, no labored breathing. Breath Sounds:   GI: Non-distended, soft, mildly tender diffusely, no guarding, no rebound  EXT: WWP, no c/c/e  NEURO: A&Ox3, moving all ext.  CENTRAL LINE:  None  DRAINS: None    DETAILED I&O:  I/O last 3 completed shifts:  In: 2040 [P.O.:1040; IV Piggyback:1000]  Out: 2200 [Urine:2200]    Intake/Output Summary (Last 24 hours) at 02/01/2023 0908  Last data filed at 02/01/2023 0600  Gross per 24 hour   Intake 840 ml   Output 1600 ml   Net -760 ml        LAB REVIEW:    WBC/Hgb/Hct/Plts:  6.22/9.7/31.6/294 (02/12 0454)   Na/K/Cl/CO2/BUN/Cr/glu:  139/4.9/103/28/12/1.10/71 (02/12 0454)   Lab Results   Component Value Date    ALT 10 01/26/2023    AST 20 01/26/2023    ALKPHOS 62 01/26/2023    BILITOT 0.2 01/26/2023               MICRO:  Recent Labs     02/01/23  0454 01/31/23  0456 01/30/23  0628   WBC 6.22 6.15 5.86     Recent Results (from the past 336 hour(s))   C.difficile PCR with Reflex to Toxin Antigen    Collection Time: 01/28/23 11:11 AM    Specimen: Stool   Result Value Ref Range    C. difficile PCR Negative Negative     H pylori Ag,Stool    Collection Time: 01/28/23 11:11 AM    Specimen: Stool Result Value Ref Range    Helicobacter pylori Antigen Negative Negative   Bact Enteric Pathogen Panel PCR, Stool    Collection Time: 01/28/23 11:18 AM    Specimen: Stool   Result Value Ref Range    Shigella species Not Detected Not Detected    Shiga Toxin Not Detected Not Detected    Campylobacter species Not Detected Not Detected    Salmonella species Not Detected Not Detected       IMAGING/STUDIES:   Below images were reviewed with surgical team.  CT abd+pelvis angiogram wo+w contrast    Result Date: 01/31/2023  CT ABD+PELVIS ANGIOGRAM WO+W CONTRAST CLINICAL HISTORY: Evaluate for mesenteric ischemia, post prandial abdominal pain. COMPARISON: CT abdomen pelvis 01/25/2023 TECHNIQUE: On a multirow-detector CT scanner, a volumetric pre- and post-contrast scan was performed through the abdomen in the arterial and venous phases followed by a scan through the pelvis in the venous phase. Post processed CTA MIP reformatted images were provided. CONTRAST: iohexol (Omnipaque) 350 mg/mL inj 100 mL RADIATION DOSE: The patient received the following exposure event(s) during this study, and the dose reference values for each are as shown (CTDIvol in mGy, DLP in mGy-cm). Note that the values are not patient dose but numbers generated from scan acquisition factors based on 32 cm (L) and/or 16 cm (S) phantoms and may substantially under-estimate or over-estimate actual patient dose based on patient size and other factors. PreMonitoring, CTDI(L): 0.8, DLP: 0.8;Monitoring, CTDI(L): 2.3, DLP: 2.3;Venous, CTDI(L): 13, DLP: 584.8;Abd wo, CTDI(L): 8, DLP: 223.9;Arterial, CTDI(L): 11.2, DLP: 315.2 FINDINGS: VASCULAR FINDINGS: Active extravasation: None. Hematoma: None.  Atherosclerotic calcifications: Moderate. Aorta: No aneurysm or dissection. Celiac artery: Patent, mild narrowing at the origin. SMA: Unchanged moderate-severe narrowing of the superior mesenteric artery origin with distal patency. IMA: Patent. Renal arteries: Patent bilaterally with mild narrowing of the left renal artery origin. SMV: Patent. IMV: Patent. IVC: Patent. ADDITIONAL FINDINGS: Lower chest: Small right and trace left pleural effusions, with bibasilar atelectasis. Liver: Normal density and contour. No solid mass. Gallbladder and bile ducts: Surgically absent gallbladder with mildly dilated bile ducts, likely related to postcholecystectomy state. Spleen: Unremarkable. Pancreas: Atrophic with unchanged prominence of the pancreatic duct. Adrenals: Unremarkable. Kidneys and ureters: Normal renal enhancement. No hydronephrosis. Bowel: Status post partial gastrectomy and gastrojejunostomy. Nondilated bowel with no wall thickening. Large stool volume. Normal appendix. Bladder: Unremarkable. Reproductive organs: Unremarkable. Lymph nodes: No lymphadenopathy. Peritoneum: No free air, free fluid, or fluid collections. Abdominal wall: Postsurgical changes in the anterior abdominal wall. Diffuse body wall edema. Dystrophic calcifications in the gluteal subcutaneous tissues. Bones: Multilevel degenerative changes of the spine with grade 1 anterolisthesis of L5-S1.Marland Kitchen     IMPRESSION: 1.  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 2.  Large stool volume. 3.  Volume overload with small bilateral pleural effusions and diffuse body wall edema. IArdeen Fillers, M.D., have reviewed this radiological study personally and I am in full agreement with the findings of the report presented here. Dictated by: Orson Aloe   01/31/2023 5:42 PM Signed by: Ardeen Fillers   01/31/2023 9:27 PM    CT abd+pelvis w contrast    Result Date: 01/25/2023  CT ABD+PELVIS W CONTRAST CLINICAL HISTORY: Abdominal pain. COMPARISON: CT 11/24/2022, 07/23/2022 TECHNIQUE: On a multirow-detector CT scanner, a volumetric contrast enhanced scan was performed through the abdomen and pelvis. CONTRAST: iodixanol (Visipaque) 320 mg/mL inj 100 mL RADIATION DOSE: The patient received the following exposure event(s) during this study, and the dose reference values for each are as shown (CTDIvol in mGy, DLP in mGy-cm). Note that the values are not patient dose but numbers generated from scan acquisition factors based on 32 cm (L) and/or 16 cm (S) phantoms and may substantially under-estimate or over-estimate actual patient dose based on patient size and other factors. PreMonitoring, CTDI(L): 0.8, DLP: 0.8;Monitoring, CTDI(L): 1.6, DLP: 1.6;DE_Abd/Pel, CTDI(L): 4.8, DLP: 188.6 FINDINGS: Lower chest: Stable subpleural scarring in bibasilar atelectasis. Coronary artery calcifications. Trace pericardial fluid. Liver: Unremarkable. Gallbladder and bile ducts: Surgically absent gallbladder. Stable mild-moderately dilated bile ducts similar to prior dated 11/24/2022, with common bile duct measuring up to 10 mm Spleen: Unremarkable. Pancreas: Grossly stable prominence of the pancreatic duct measuring up to 4 mm in the pancreatic head. Adrenals: Unremarkable. Kidneys and ureters: Unremarkable. Bowel: Postsurgical changes after partial gastrectomy and gastrojejunostomy. Nondilated small and large bowel. Scattered fluid within the jejunum. There is stable mild apparent wall thickening of the ascending and transverse colon without surrounding fat  stranding. Bladder: Unremarkable. Reproductive organs: Unremarkable. Lymph nodes: Unremarkable. Peritoneum: No free air, free fluid, or fluid collections. Vessels: Moderate atherosclerosis. Abdominal wall: Postsurgical changes in the anterior abdominal wall. Gluteal calcifications likely dystrophic Bones: Demineralized bones with degenerative changes of the spine most pronounced at L5-S1 where there is severe disc height loss and unchanged 5 mm anterolisthesis.     IMPRESSION: 1. No acute CT abnormality in the abdomen or pelvis. No significant interval change from prior CT. 2. Cholecystectomy with stable biliary dilatation which is likely physiologic for this patient given lack of  interval change and normal cholestatic serum markers. If clinical concern for distal biliary obstruction MRCP may be considered. 3. Stable mild wall thickening of the cecum and proximal transverse colon likely exaggerated by under distention without surrounding fat stranding Signed by: Laural Benes   01/25/2023 9:55 AM     ASSESSMENT/PLAN:  57 y.o. female w PMHx COPD, colonic perforation s/p colostomy  s/p reversal, PUD c/b gastric outlet obstruction s/p multiple surgeries including laparscopic vagotomy, antrectomy and gastrojejunostomy (Dr. Prentice Docker), cholelithiasis, and gallstone pancreatitis, presented to ED for worsening diarrhea and acute on chronic PO intolerance, found to have moderate to severe narrowing of the SMA origin. Vascular Surgery was consulted for surgical evaluation of this CT finding with c/f mesenteric ischemia.     The pt endorses chronic post prandial abdominal pain, chronic nausea/vomiting since 03/2022, all with no acute worsening. Abdominal exam is mildly TTP with no signs of peritonitis. Upon discussion with radiology and independent review of imaging, CT A/P with contrast in 04/2022 shows similar plaques in SMA with similar degree of stenosis. Given the chronicity and stability of the patient's symptoms and CT findings, with no acute changes in her abdominal exam, the patient's presention favors chronic mesenteric ischemia. Her ongoing abdominal pain, PO intolerance, and weight loss warrant further outpatient evaluation to determine candidacy for surgical intervention.     We recommend the following:     - No acute surgical intervention indicated at this time.   - If colonoscopy is negative, may follow up with Vascular Surgery outpatient for further workup/management. Pt may call [phone] to set up a clinic visit.   - Please continue statin upon discharge.  - Serial abdominal exams, please page the Vascular surgery team for any acute changes in exam.  - Please page [phone] (surgery, [place] team) with any questions.     The patient was examined and discussed with Vascular Surgery team & Surgical Attending Dr. Keene Breath who agrees with assessment and plans listed above.    Author:  Jacqulynn Cadet. Arie Gable  9:08 AM 02/01/2023  Division of General Surgery  [facility]

## 2023-02-01 NOTE — Progress Notes
INTERNAL MEDICINE - INPATIENT PROGRESS NOTE    ADMISSION DATE: 01/25/2023    DATE OF SERVICE: 02/01/2023  HOSPITAL DAY: 7    PRINCIPLE PROBLEM: Abdominal pain    CC: Abdominal Pain (Abdominal pain, vomiting and diarrhea x6 days Denies fever. )    INTERVAL EVENTS AND REVIEW OF SYSTEMS:  CTA yesterday showing narrowing of SMA, discussed with vascular surgery. CTA also showing diffuse body wall edema and large stool burden. Discussed with patient that she needs to start having BM. Also discussed with patient that she is nearing discharge and that we need to take stop IV pain medication. Patient extremely upset and resistant to change. Asking to have both percocet and oxycodone, discussed that it is not safe to have 2 oral opiate medications but patient still upset as she is on an oral and IV pain medication. Discussed TTE to evaluate cardiac function, which patient is agreeable to.    MEDICATIONS:  acetaminophen, 1,000 mg, Oral, TID  atorvastatin, 80 mg, Oral, Daily  cholestyramine, 4 g, Oral, TID  cyanocobalamin, 1,000 mcg, Oral, Daily  DULoxetine, 30 mg, Oral, Daily  enoxaparin, 40 mg, Subcutaneous, Daily  fluticasone, 1 puff, Inhalation, Daily  furosemide inj, 20 mg, IV Push, Once  gabapentin, 100 mg, Oral, TID  hyoscyamine, 125 mcg, Oral, Q4H  lidocaine, 1 patch, Transdermal, Q24H  methocarbamol, 500 mg, Oral, TID  nicotine, 1 patch, Transdermal, Daily  nitroglycerin, 1 inch, Rectal, Q12H  ondansetron ODT, 4 mg, Oral, Q6H  pancrelipase (Lip-Prot-Amyl), 36,000 units of lipase, Oral, TID w/meals  pantoprazole, 40 mg, Oral, BID  polyethylene glycol, 17 g, Oral, Daily  QUEtiapine, 150 mg, Oral, BID  senna, 1 tablet, Oral, QHS  vitamin B complex, 1 capsule, Oral, Daily  vitamin D (cholecalciferol), 25 mcg, Oral, Daily  PRNs: albuterol, aluminum-magnesium hydroxide-simethicone, lidocaine, lidocaine PF, loperamide, oxyCODONE **OR** oxyCODONE, prochlorperazine, sodium chloride, traZODone    VITALS:  Temp:  [36.5 ?C (97.7 ?F)-36.6 ?C (97.9 ?F)] 36.6 ?C (97.8 ?F)  Heart Rate:  [64-82] 78  Resp:  [16-18] 16  BP: (87-132)/(52-81) 94/71  NBP Mean:  [64-96] 80  SpO2:  [93 %-97 %] 97 %     Weight: 45.4 kg (100 lb) Oxygen Therapy  SpO2: 97 %  O2 Device: Nasal cannula  Flow Rate (L/min): 2 L/min     IN'S AND OUT'S:  I/O last 2 completed shifts:  In: 840 [P.O.:840]  Out: 1600 [Urine:1600]    PHYSICAL EXAM:  General: alert, well appearing, and in no distress.   Lungs: normal work of breathing  Abdomen: soft, diffuse discomfort with palpation, non distended  Neuro: fully oriented, no focal deficit    DATA  I have reviewed the following information from the last 24 hours: allied health and treating physician notes, imaging, labs and microbiology data, and cardiac studies and telemetry data (if on monitor)  Na/K/Cl/CO2/BUN/Cr/glu:  139/4.9/103/28/12/1.10/71 (02/12 0454)     WBC/Hgb/Hct/Plts:  6.22/9.7/31.6/294 (02/12 0454)            ASSESSMENT:  Donna Gross is a 57 y.o. female with hx of anxiety, depression, emphysema, fibromyalgia, gastritis, GERD, colon surgery and pancreatitis who presents to the ED with complaint of abdominal pain s/p abdominal surgery on 5/3. Pt had a cholecystectomy on 4/8, was treated for an infection, and admitted from 5/3-5/8 for ERCP and biliary stent placement who now presents with and is admitted with acute on chronic abdominal pain.     #Abdominal Pain, POA   #Diarrhea- POA, chronic  #hx of  pancreatitis  #Severe protein calorie malnutrition, POA  Patient recently underwent CT imaging without acute pathologic findings. C diff, H pylori, celiac studies negative. CTA with narrowing of SMA.  - Tylenol/Oxy/Dilaudid for pain, prn topical lidocaine for rectal pain, nitroglycerin rectal ointment BID   - C/S to Nutrition Services   - Zofran 4 mg q6h  - stool studies: f/u calprotection, pancreatic elastase  - PPI BID  - continue home pancrelipase (creon ) 36,000 u TID with meals  - continue home cholestyramine 4g TID  - s/p EDG 2/8 without acute source, s/p bx - f/u biopsy results  - patient unable to tolerate prep for colonoscopy, will plan for outpatient   - [facility] med consult: concern for myofascial component to pain. Patient declined acupuncture/trigger  Pain control   - tylenol 1000 TID    - gabapentin 100 TID - added 2/11   - methocarbamol 500 TID - added 2/11   - oxy 10/15 q4h mod/severe pain    - stop dilaudid 0.2 IV q4h PRN breakthrough  - vascular surgery consulted for SMA narrowing, will see patient  - atorvastatin 80 nightly started for atherosclerotic disease in SMA    #body wall edema  #elevated BNP  Body wall edema noted on CTA. Patient with normal liver and kidney function and appearance on imaging. Will r/o cardiac causes with TTE.  - f/u TTE  - lasix 20 IV x1 given elevated BNP and intermittent O2 requirement    #AKI- improved  - Baseline creat likely 0.8-1.1; admission Creat 1.3  - suspect AKI in setting of poor PO intake/diarrhea  - monitor BMP; avoid nephrotoxic agents     #Anxiety, POA   #Depression, POA   #Fibromyalgia   #Insomnia,   - continue Seroquel 150mg  BID  - Continue duloxetine 40 mg qday   -Trazodone 100 mg at bedtime PRN     #Tobacco Dependence - Nicotine patch         INPATIENT CHECKLIST:  Diet: low fiber diet  VTE Prophylaxis: enoxaparin  Central Lines: none  Tubes/Drains: none  Activity: as tolerated  Precautions: No infectious isolation,      ADVANCED DIRECTIVES:  Full Code, Primary Emergency Contact: [name],[name]     DISPOSITION:  Inpatient, Non-Monitored. Expected post-hospitalization disposition will be to home.    AUTHOR:  Miguel Dibble, MD  02/01/2023 at 2:38 PM    65 minutes were spent personally by me today on this encounter which include today's pre-visit review of the chart, obtaining appropriate history, performing an evaluation, documentation and discussion of management with details supported within the note for today's visit. The time documented was exclusive of any time spent on the separately billed procedure. Prolonged time due to extensive discussion with patient regarding pain control.

## 2023-02-01 NOTE — Nursing Note
1115 Covering lunch break for primary RN Murray Hodgkins. Pt BP is 87/52 MAP 64, HR 78. Notified Dr Belva Chimes of current vital signs. Pt is asymptomatic, no apparent distress, sitting up in bed eating breakfast. Dr Belva Chimes came to bedside to assess patient. Plan is to continue to monitor, as long as patient remains asymptomatic no interventions for BP will be done. Echo is still pending and pain regimen is to transition to PO oxycodone only, discontinued IV dilaudid.

## 2023-02-01 NOTE — Other
Patient's Clinical Goal:   Clinical Goal(s) for the Shift: Pain mgt, safety, VSS, comfort, rest  Identify possible barriers to advancing the care plan: none  Stability of the patient: Moderately Unstable - medium risk of patient condition declining or worsening    Progression of Patient's Clinical Goal:   Problem/Goal:   Abdominal Pain  Response to Plan of Care and any changes in condition (clinical condition, any concerning assessments, events, interventions; response to POC effectiveness or changes made):   A/Ox4, afebrile.  Abd pain mgt as per orders.  Zofran given as per orders, no vomiting.  No distrss noted overnight.    Interdisciplinary communication (team member communication):   None    Psychosocial communication (family or patient issues potentially affecting care):   None    Plan and Disposition (progression toward specific goals of care/hospitalization and discharge):  Pain and nausea mgt.

## 2023-02-01 NOTE — Consults
IP CM ACTIVE DISCHARGE PLANNING  Department of Care Coordination      Admit [phone]  Anticipated Date of Discharge: 02/03/2023    Following [id number], Luan Moore., MD      Today's short update     Abdominal pain, vol overload for Echo.   Anticipated discharge Home tomorrow with no needs    Disposition     Home, No needs identified  [address]  [place] [address]  Family/Support System in agreement with the current discharge plan: Yes, in agreement and participating                      Inger Dandrea,  02/01/2023  RN Case Manager   [phone] / [phone]

## 2023-02-01 NOTE — Other
Patient's Clinical Goal:   Clinical Goal(s) for the Shift: Pain mgt, safety, VSS, comfort, rest  Identify possible barriers to advancing the care plan:   Stability of the patient: Moderately Stable - low risk of patient condition declining or worsening   Progression of Patient's Clinical Goal:   Problem/Goal:   Abdominal pain, diarrhea.  Response to Plan of Care and any changes in condition (clinical condition, any concerning assessments, events, interventions; response to POC effectiveness or changes made):   A&Ox4.   BMAT 4.  VSS. Hypotensive ; MD is aware, asymptomatic. No further orders.  Patient requiring 2L NC. Gave IS at bedside.   Constant abdominal pain - managed with PO oxycodone. D/c dilaudid IVP.  Slowly eating PO diet. No nausea/emesis noted  BNP = 214. Lasix IV x1  Voids  No BM    Interdisciplinary communication (team member communication):   None    Psychosocial communication (family or patient issues potentially affecting care):   None    Plan and Disposition (progression toward specific goals of care/hospitalization and discharge):  Plan for dc tomorrow.

## 2023-02-01 NOTE — Consults
SPIRITUAL CARE CONSULTATION NOTE    PATIENT:  Donna Gross  MRN:  [id number]     Patient Info        Religious/Spiritual Identity:        No Religious Preference       Last Anointed Date:                 Baptised:                 Spiritual Care Visit Details              Date of Visit:  02/01/23  Time of Visit:  1455  Visited with Patient   Visit length 15 Minutes   Referral source Self-initiated   Reason for visit Initial visit/assessment      Spiritual Assessment     Spiritual practices & resources Chaplain visits, Personal faith/Spiritual beliefs   Areas of spiritual/emotional distress Adjustment to illness/hospitalization, Concerns for health and healing   Distressful feelings     Indicators of spiritual wellbeing Able to give love and support, Expresses...   Expressions of spiritual wellbeing Expresses gratitude      Plan     Spiritual care intervention Addressed emotional concerns/distress, Addressed spiritual concerns/distress, Introduction to chaplain services, Building trust, Offered words of comfort/encouragement, Explored feelings related to present illness   Outcomes (per patient/family) Appreciated visit   Spiritual care plans Continue to visit as needed   Additional comments        Recommendation            Author:  [place] 02/01/2023 3:31 PM  Contact info: SM pager: [phone] ext: [phone]

## 2023-02-02 ENCOUNTER — Ambulatory Visit: Payer: PRIVATE HEALTH INSURANCE

## 2023-02-02 LAB — Tissue Exam

## 2023-02-02 LAB — Calprotectin, Fecal by Immunoassay: CALPROTECTIN, STOOL: 192 ug/g — ABNORMAL HIGH (ref <=49)

## 2023-02-02 LAB — Basic Metabolic Panel
CALCIUM: 8.8 mg/dL (ref 8.6–10.4)
CREATININE: 1.18 mg/dL (ref 0.60–1.30)

## 2023-02-02 LAB — D-Dimer: D-DIMER STAGO: 0.61 ug{FEU}/mL — ABNORMAL HIGH (ref <0.60)

## 2023-02-02 LAB — CBC: MCH CONCENTRATION: 31 g/dL — ABNORMAL LOW (ref 31.5–35.5)

## 2023-02-02 LAB — Pancreatic Elastase, Fecal by Immunoassay: PANCREATIC ELASTASE-1, STOOL: 39 ug/g — ABNORMAL LOW (ref >=100)

## 2023-02-02 MED ORDER — FLUTICASONE PROPIONATE HFA 220 MCG/ACT IN AERO
1 | Freq: Every day | RESPIRATORY_TRACT | 0 refills
Start: 2023-02-02 — End: ?

## 2023-02-02 MED ORDER — ATORVASTATIN CALCIUM 80 MG PO TABS
80 mg | ORAL_TABLET | Freq: Every day | ORAL | 0 refills
Start: 2023-02-02 — End: ?

## 2023-02-02 MED ORDER — ONDANSETRON 4 MG PO TBDP
4 mg | ORAL_TABLET | Freq: Four times a day (QID) | ORAL | 0 refills | 8.00000 days
Start: 2023-02-02 — End: ?
  Filled 2023-02-04: qty 56, 14d supply, fill #0

## 2023-02-02 MED ORDER — PANTOPRAZOLE SODIUM 40 MG PO TBEC
40 mg | ORAL_TABLET | Freq: Two times a day (BID) | ORAL | 0 refills
Start: 2023-02-02 — End: ?
  Filled 2023-02-04: qty 60, 30d supply, fill #0

## 2023-02-02 MED ORDER — GABAPENTIN 100 MG PO CAPS
100 mg | ORAL_CAPSULE | Freq: Three times a day (TID) | ORAL | 0 refills
Start: 2023-02-02 — End: ?
  Filled 2023-02-04: qty 90, 30d supply, fill #0

## 2023-02-02 MED ORDER — POLYETHYLENE GLYCOL 3350 17 G PO PACK
17 g | PACK | Freq: Every day | ORAL | 0 refills | 28.00000 days
Start: 2023-02-02 — End: ?

## 2023-02-02 MED ORDER — CHOLECALCIFEROL 25 MCG (1000 UT) PO TABS
25 ug | ORAL_TABLET | Freq: Every day | ORAL | 0 refills
Start: 2023-02-02 — End: ?

## 2023-02-02 MED ORDER — METHOCARBAMOL 500 MG PO TABS
500 mg | ORAL_TABLET | Freq: Three times a day (TID) | ORAL | 0 refills | 17.00000 days
Start: 2023-02-02 — End: ?
  Filled 2023-02-04: qty 90, 30d supply, fill #0

## 2023-02-02 MED ADMIN — B COMPLEX PO CAPS: 1 | ORAL | @ 17:00:00 | Stop: 2023-02-04

## 2023-02-02 MED ADMIN — VITAMIN D3 25 MCG (1000 UT) PO TABS: 25 ug | ORAL | @ 17:00:00 | Stop: 2023-02-04

## 2023-02-02 MED ADMIN — CHOLESTYRAMINE 4 G PO PACK: 4 g | ORAL | @ 17:00:00 | Stop: 2023-02-04 | NDC 68382052860

## 2023-02-02 MED ADMIN — OXYCODONE HCL 5 MG PO TABS: 15 mg | ORAL | @ 23:00:00 | Stop: 2023-02-04 | NDC 68084035411

## 2023-02-02 MED ADMIN — HYOSCYAMINE SULFATE 0.125 MG PO TABS: 125 ug | ORAL | @ 10:00:00 | Stop: 2023-05-10

## 2023-02-02 MED ADMIN — METHOCARBAMOL 500 MG PO TABS: 500 mg | ORAL | @ 13:00:00 | Stop: 2023-02-04 | NDC 60687055911

## 2023-02-02 MED ADMIN — GABAPENTIN 100 MG PO CAPS: 100 mg | ORAL | @ 23:00:00 | Stop: 2023-02-04 | NDC 60687058011

## 2023-02-02 MED ADMIN — ATORVASTATIN CALCIUM 40 MG PO TABS: 80 mg | ORAL | @ 17:00:00 | Stop: 2023-02-04 | NDC 68084009911

## 2023-02-02 MED ADMIN — HYOSCYAMINE SULFATE 0.125 MG PO TABS: 125 ug | ORAL | @ 23:00:00 | Stop: 2023-02-04

## 2023-02-02 MED ADMIN — FLUTICASONE PROPIONATE HFA 220 MCG/ACT IN AERO: 1 | RESPIRATORY_TRACT | @ 17:00:00 | Stop: 2023-02-04 | NDC 66993008096

## 2023-02-02 MED ADMIN — OXYCODONE HCL 5 MG PO TABS: 15 mg | ORAL | @ 19:00:00 | Stop: 2023-02-04 | NDC 68084035411

## 2023-02-02 MED ADMIN — OXYCODONE HCL 5 MG PO TABS: 15 mg | ORAL | @ 06:00:00 | Stop: 2023-02-04 | NDC 68084035411

## 2023-02-02 MED ADMIN — NITROGLYCERIN 0.4 % RE OINT: 1 [in_us] | RECTAL | @ 22:00:00 | Stop: 2023-02-04

## 2023-02-02 MED ADMIN — HYOSCYAMINE SULFATE 0.125 MG PO TABS: 125 ug | ORAL | @ 13:00:00 | Stop: 2023-02-04 | NDC 42192034001

## 2023-02-02 MED ADMIN — PANTOPRAZOLE SODIUM 40 MG PO TBEC: 40 mg | ORAL | @ 06:00:00 | Stop: 2023-02-04 | NDC 60687073609

## 2023-02-02 MED ADMIN — CHOLESTYRAMINE 4 G PO PACK: 4 g | ORAL | @ 06:00:00 | Stop: 2023-02-04

## 2023-02-02 MED ADMIN — ONDANSETRON 4 MG PO TBDP: 4 mg | ORAL | @ 23:00:00 | Stop: 2023-02-04 | NDC 57237007710

## 2023-02-02 MED ADMIN — PANCRELIPASE (LIP-PROT-AMYL) 36000-114000 UNITS PO CPEP: 36000 [IU] | ORAL | @ 23:00:00 | Stop: 2023-02-04 | NDC 00032301613

## 2023-02-02 MED ADMIN — ACETAMINOPHEN 500 MG PO TABS: 1000 mg | ORAL | @ 13:00:00 | Stop: 2023-02-04 | NDC 00904673061

## 2023-02-02 MED ADMIN — PANCRELIPASE (LIP-PROT-AMYL) 36000-114000 UNITS PO CPEP: 36000 [IU] | ORAL | @ 03:00:00 | Stop: 2023-02-25 | NDC 00032301613

## 2023-02-02 MED ADMIN — PANTOPRAZOLE SODIUM 40 MG PO TBEC: 40 mg | ORAL | @ 17:00:00 | Stop: 2023-02-04 | NDC 60687073609

## 2023-02-02 MED ADMIN — GABAPENTIN 100 MG PO CAPS: 100 mg | ORAL | @ 13:00:00 | Stop: 2023-02-04 | NDC 60687058011

## 2023-02-02 MED ADMIN — OXYCODONE HCL 5 MG PO TABS: 15 mg | ORAL | Stop: 2023-02-04 | NDC 68084035411

## 2023-02-02 MED ADMIN — NICOTINE 14 MG/24HR TD PT24: 14 mg | TRANSDERMAL | @ 17:00:00 | Stop: 2023-02-04

## 2023-02-02 MED ADMIN — METHOCARBAMOL 500 MG PO TABS: 500 mg | ORAL | @ 23:00:00 | Stop: 2023-02-04 | NDC 60687055911

## 2023-02-02 MED ADMIN — POLYETHYLENE GLYCOL 3350 17 G PO PACK: 17 g | ORAL | @ 17:00:00 | Stop: 2023-02-04 | NDC 60687043199

## 2023-02-02 MED ADMIN — HYOSCYAMINE SULFATE 0.125 MG PO TABS: 125 ug | ORAL | @ 17:00:00 | Stop: 2023-02-04 | NDC 42192034001

## 2023-02-02 MED ADMIN — VITAMIN B-12 500 MCG PO TABS: 1000 ug | ORAL | @ 17:00:00 | Stop: 2023-02-04 | NDC 77333093725

## 2023-02-02 MED ADMIN — B COMPLEX PO CAPS: 1 | ORAL | @ 17:00:00 | Stop: 2023-02-25

## 2023-02-02 MED ADMIN — LIDOCAINE 5 % EX PTCH: 1 | TRANSDERMAL | @ 06:00:00 | Stop: 2023-02-26

## 2023-02-02 MED ADMIN — ZZ IMS TEMPLATE: 150 mg | ORAL | @ 06:00:00 | Stop: 2023-02-04 | NDC 60687034911

## 2023-02-02 MED ADMIN — ENOXAPARIN SODIUM 40 MG/0.4ML IJ SOSY: 40 mg | SUBCUTANEOUS | @ 17:00:00 | Stop: 2023-02-04

## 2023-02-02 MED ADMIN — CHOLESTYRAMINE 4 G PO PACK: 4 g | ORAL | @ 23:00:00 | Stop: 2023-02-04

## 2023-02-02 MED ADMIN — NICOTINE 14 MG/24HR TD PT24: 14 mg | TRANSDERMAL | @ 17:00:00 | Stop: 2023-02-04 | NDC 00536589588

## 2023-02-02 MED ADMIN — GABAPENTIN 100 MG PO CAPS: 100 mg | ORAL | @ 06:00:00 | Stop: 2023-02-04 | NDC 60687058011

## 2023-02-02 MED ADMIN — ONDANSETRON 4 MG PO TBDP: 4 mg | ORAL | @ 13:00:00 | Stop: 2023-02-04 | NDC 57237007710

## 2023-02-02 MED ADMIN — SENNOSIDES 8.6 MG PO TABS: 1 | ORAL | @ 06:00:00 | Stop: 2023-02-04 | NDC 71399824503

## 2023-02-02 MED ADMIN — METHOCARBAMOL 500 MG PO TABS: 500 mg | ORAL | @ 06:00:00 | Stop: 2023-02-04 | NDC 60687055911

## 2023-02-02 MED ADMIN — ONDANSETRON 4 MG PO TBDP: 4 mg | ORAL | @ 03:00:00 | Stop: 2023-02-04 | NDC 57237007710

## 2023-02-02 MED ADMIN — OXYCODONE HCL 5 MG PO TABS: 15 mg | ORAL | @ 13:00:00 | Stop: 2023-02-04 | NDC 68084035411

## 2023-02-02 MED ADMIN — ACETAMINOPHEN 500 MG PO TABS: 1000 mg | ORAL | @ 06:00:00 | Stop: 2023-02-04 | NDC 00904673061

## 2023-02-02 MED ADMIN — ONDANSETRON 4 MG PO TBDP: 4 mg | ORAL | @ 11:00:00 | Stop: 2023-02-04

## 2023-02-02 MED ADMIN — ACETAMINOPHEN 500 MG PO TABS: 1000 mg | ORAL | @ 23:00:00 | Stop: 2023-02-04 | NDC 00904673061

## 2023-02-02 MED ADMIN — PANCRELIPASE (LIP-PROT-AMYL) 36000-114000 UNITS PO CPEP: 36000 [IU] | ORAL | @ 17:00:00 | Stop: 2023-02-04 | NDC 00032301613

## 2023-02-02 MED ADMIN — TRAZODONE HCL 100 MG PO TABS: 100 mg | ORAL | @ 06:00:00 | Stop: 2023-02-04 | NDC 60687045411

## 2023-02-02 MED ADMIN — DULOXETINE HCL 30 MG PO CPEP: 30 mg | ORAL | @ 17:00:00 | Stop: 2023-02-04 | NDC 60687073411

## 2023-02-02 MED ADMIN — NITROGLYCERIN 0.4 % RE OINT: 1 [in_us] | RECTAL | @ 06:00:00 | Stop: 2023-02-04

## 2023-02-02 MED ADMIN — HYOSCYAMINE SULFATE 0.125 MG PO TABS: 125 ug | ORAL | Stop: 2023-02-04 | NDC 42192034001

## 2023-02-02 MED ADMIN — ZZ IMS TEMPLATE: 150 mg | ORAL | @ 17:00:00 | Stop: 2023-02-04 | NDC 60687034911

## 2023-02-02 MED ADMIN — HYOSCYAMINE SULFATE 0.125 MG PO TABS: 125 ug | ORAL | @ 06:00:00 | Stop: 2023-02-04 | NDC 42192034001

## 2023-02-02 NOTE — Progress Notes
[facility]PART[facility] OF [facility]  INPATIENT NOTE   [facility]    PATIENT: Donna Gross  ADMISSION DATE: 01/25/2023    HOSPITAL DAY: 8  DATE OF SERVICE: 01/25/2023    Donna Gross is a 57 y.o. female who is admitted for Abdominal pain    Subjective Report  47F h/o anxiety, depression, fm, had a cholecystectomy 4/8 and has been struggling with abdominal pain since. Admitted for acute exacerbation and c/f pancreatitis.    She has had prior instrumentation of ERCP with empiric biliary stent early May but without help. She was seen by GI who will persue upper and lower endoscopy tomorrow, and recommended EW consult for pain mgmt.    Her pain is throughout the abodmen, both upper and peri-umbilical, associated with tension and ttp throughout. It is better with self-massage and heat does provide temporary relief.     She is not at all interested in acupuncture or trigger point injections or any other needles    Lipase 32 on admission    Interval History 01/29/2023  UGI with some gastric erythema but unable to prep for colo yesterday  Ongoing abdomianl pain, unchanged. Exam repeated, unchanged  Still not interested in acupuncture or trigger point injection     Interval History 02/02/2023  CTA showing mesenteric ischemia. Seen by vascular surgery, agrees this is likely chronic mesenteric ischemia but with no inpatient surgical plan, will defer to outpatient mgmt.   Pt seen today, still with same pain. Still declining EW interventions      Objective (click to expand/collapse)     I reviewed these specialist notes that directly relate to the patient's acute and/or chronic medical problems with documentation of the salient findings, if any:       Last Gastroenterology Note           02/01/23 1734 Discharge Instructions signed by Brita Romp           and      Last Hospitalist Note           02/01/23 1734 Discharge Instructions signed by Brita Romp.              PRNs administered:  Medications 02/01/23 02/02/23      albuterol 90 mcg/act inh 2 puff  Dose: 2 puff  Freq: Every 6 hours PRN Route: IN  PRN Reasons: Wheezing,Shortness of Breath  Start: 01/25/23 1207   End: 02/05/23 1945         aluminum-magnesium hydroxide-simethicone 400-400-40 mg/5 mL susp 15 mL  Dose: 15 mL  Freq: Every 6 hours PRN Route: PO  PRN Comment: Dyspepsia  Start: 01/25/23 1215   End: 02/24/23 1214         bisacodyl supp 10 mg  Dose: 10 mg  Freq: Daily PRN Route: RE  PRN Reason: Constipation  Start: 02/02/23 0836   End: 03/04/23 0935         lidocaine 5% oint  Freq: Daily PRN Route: TOP  PRN Comment: anal fissure pain  Start: 01/26/23 2010   End: 02/25/23 2009         lidocaine PF 1% inj 2 mL  Dose: 2 mL  Freq: Daily PRN Route: SC  PRN Comment: for MD administered trigger point injections  Start: 01/29/23 0947   End: 02/05/23 0946         loperamide cap 2 mg  Dose: 2 mg  Freq: 4 times daily PRN Route: PO  PRN Reason: Diarrhea  Start:  01/29/23 1444   End: 02/28/23 1543         oxyCODONE tab 10 mg  Dose: 10 mg  Freq: Every 4 hours PRN Route: PO  PRN Reason: Moderate Pain (Pain Scale 4-6)  Start: 02/01/23 1145   End: 02/11/23 1144    1239-See Alt     1628-See Alt     2142-See Alt          0523-See Alt              Or  oxyCODONE tab 15 mg  Dose: 15 mg  Freq: Every 4 hours PRN Route: PO  PRN Reason: Severe Pain (Pain Scale 7-10)  Start: 02/01/23 1145   End: 02/11/23 1144    1239-Given     1628-Given     2142-Given          0523-Given               prochlorperazine tab 10 mg  Dose: 10 mg  Freq: Every 6 hours PRN Route: PO  PRN Reasons: Nausea,Vomiting  PRN Comment: 2nd line  Start: 01/29/23 1443   End: 02/28/23 1542         sodium chloride 0.9% IV soln  Dose: 10 mL/hr  Freq: As needed for Route: IV  PRN Comment: compatible IVPB  Start: 01/28/23 1002   End: 02/27/23 1001   Order specific questions:   Nurse Protocol Document [email or web address] (Revised: 09-2020) traZODone tab 100 mg  Dose: 100 mg  Freq: Every night at bedtime PRN Route: PO  PRN Comment: Sleep  Start: 01/25/23 1215   End: 02/24/23 1214    2142-Given                        PHYSICAL EXAM    Temp:  [36.2 ?C (97.2 ?F)-36.9 ?C (98.4 ?F)] 36.6 ?C (97.9 ?F)  Heart Rate:  [58-92] 58  Resp:  [16-18] 16  BP: (74-108)/(52-73) 108/73  NBP Mean:  [62-84] 84  SpO2:  [86 %-100 %] 96 %   Weight: 45.4 kg (100 lb) Oxygen Therapy  SpO2: 96 %  O2 Device: Nasal cannula  Flow Rate (L/min): 3 L/min    PHYSICAL EXAM:      System Check if normal Positive or additional negative findings   Constit  []  General appearance     Eyes  []  Conj/Lids []  Pupils  []  Fundi     HENMT  []  External ears/nose []  Otoscopy   []  Gross Hearing []  Nasal mucosa   []  Lips/teeth/gums []  Oropharynx    []  mucus membranes []  Head     Neck  []  Inspection/palpation []  Thyroid     Resp  []  Effort []  Wheezing    []  Auscultation  []  Crackles     CV  []  Rhythm/rate   []  Murmurs   []  LEE   []  JVP non-elevated    Normal pulses:   []  Radial []  Femoral  []  Pedal     Breast  []  Inspection []  Palpation     GI  []  abd masses    []  tenderness   []  rebound/guarding   []  Liver/spleen []  Rectal     GU  M: []  Scrotum []  Penis []  Prostate   F:  []  External []  vaginal wall        []  Cervix  []  mucus        []  Uterus    []  Adnexa      Lymph  []   Neck []  Axillae []  Groin     MSK Specify site examined:    []  Inspect/palp []  ROM   []  Stability []  Strength/tone  Scarring noted in abdomen, including midline. Carnett's sign positive       Skin  []  Inspection []  Palpation     Neuro  []  CN2-12 intact grossly   []  Alert and oriented   []  DTR      []  Muscle strength      []  Sensation   []  Gait/balance     Psych  []  Insight/judgement     []  Mood/affect    []  Gross cognition            LABS/STUDIES    Lab Studies:    Lab Results   Component Value Date    WBC 5.56 02/02/2023    HGB 9.2 (L) 02/02/2023    HCT 29.7 (L) 02/02/2023    MCV 86.8 02/02/2023    PLT 301 02/02/2023     INR   Date Value Ref Range Status   01/27/2023 1.0 . Final     Comment:     Therapeutic Range: INR 2-3  Mechanical Valves: INR 2.5-3.5       INR (Outside Lab)   Date Value Ref Range Status   03/09/2021 0.90  Final     Prothrombin Time   Date Value Ref Range Status   01/27/2023 13.3 11.5 - 14.4 seconds Final     Prothrombin Time (Outside Lab)   Date Value Ref Range Status   03/09/2021 12.3  Final     APTT   Date Value Ref Range Status   04/08/2021 27.3 24.4 - 36.2 seconds Final     Comment:     aPTT based therapeutic range for unfractionated heparin therapy is 85.5-111.6 seconds.     Magnesium   Date Value Ref Range Status   01/28/2023 1.2 (L) 1.4 - 1.9 mEq/L Final         Imaging Studies:     all imaging in past 24 hours No imaging has been resulted in the last 24 hours     TREATMENTS PROVIDED TODAY  none         ASSESSMENT AND PLAN    Donna Gross is a 57 y.o. female admitted for Abdominal pain    #abdominal pain - with onset following cholecystectomy and with subsequent instrumentation as well. Given positive Carnett's sign exam findings, some of her pain may be reactive (post-traumatic) myofascial pain. CTA showing mesenteric ischemia as well, which is likely the trigger.  -continue to recommended acupuncture and/or trigger point injections both to confirm dx and treat it, but these were declined  -discussed diet for mesenteric ischemia  -agree with outpatient surgical planning  -declines EW intervention    Will sign off. Please re-consult [facility] if this patient requires further [facility] support later during this admission or during future admissions.     Thanks for involving [place] in the care of this patient         Author: Alric Quan 02/02/2023 10:21 AM  [facility] pager [phone]

## 2023-02-02 NOTE — Other
Patient's Clinical Goal:   Clinical Goal(s) for the Shift: Pain mgt, safety, VSS, comfort, rest  Identify possible barriers to advancing the care plan:   Stability of the patient: Moderately Unstable - medium risk of patient condition declining or worsening    Progression of Patient's Clinical Goal:   Problem/Goal:   Abdominal pain  Response to Plan of Care and any changes in condition (clinical condition, any concerning assessments, events, interventions; response to POC effectiveness or changes made):   Abdominal pain 9/10 throughout shift managed with mild effect with oxy 35m. No BM this shift. Remains hypotensive but asymptomatic. Echo pending. Independent with ADLs.   Interdisciplinary communication (team member communication):   Psychosocial communication (family or patient issues potentially affecting care):   Plan and Disposition (progression toward specific goals of care/hospitalization and discharge):  DC possibly today

## 2023-02-02 NOTE — Other
Patient's Clinical Goal:   Clinical Goal(s) for the Shift: Pain mgt, safety, VSS, comfort, rest  Identify possible barriers to advancing the care plan:   Stability of the patient: Moderately Unstable - medium risk of patient condition declining or worsening    Progression of Patient's Clinical Goal:     Problem/Goals:  Admit Dx: Abdominal pain 5 Days Post-Op   History:   has a past medical history of Anxiety, Depression, Emphysema, unspecified (HCC/RAF), Emphysema, unspecified (HCC/RAF), Fibromyalgia, Gastritis, GERD (gastroesophageal reflux disease), History of blood transfusion, Intractable nausea and vomiting (01/23/2020), and Pancreatitis.;  has a past surgical history that includes Abdominal surgery and Colon surgery.  Precautions:   Precautions: High Fall Risk  BMAT: Level 4 - Green Fall: 35  Assistance: Activity: Dangle at edge of bed   Level of Assistance: Independent     Plan of Care/ Response to outcomes:   ''Time with U'' Completed with patient  Walked patient in the room on RA, O2-93-95% however when in bed at times patient positionally desats.   S/P CTA Pending results  Ddimer- 0.61  Echo completed this shift 62.5   Patient is tolerating diet.    Change of status or condition:   Vitals Stable: Stable  Last Recorded Vital Signs:    02/02/23 1416   BP: (!) 71/55   Pulse: 92   Resp:    Temp:    SpO2: 96%     WBC/Hgb/Hct/Plts:  5.56/9.2/29.7/301 (02/13 1914)  Na/K/Cl/CO2/BUN/Cr/glu:  137/4.9/100/28/16/1.18/80 (02/13 7829)      Interdisciplinary communication: (Communication with staff)  [] Primary Care Provider: Kelle Darting, MD  [x] Attending Physician: Miguel Dibble., MD}  [phone] via page Patient B/P [phone] Checked it multiple times, also patient denies symptoms of hypotension. Per MD BP low with out symptoms, continue to monitor and obtain o2 walking.    [] Other:  [] N/A    Psychosocial communication:   Patient cooperative with care     Problems/goals Progression: (In relation to specific goals of care or hospital)    Orders  Were there any changes to medications this shift?: Yes PRN given    Yes Stool specimen pending       Boost 8Ps # refer to Boost Note for progress

## 2023-02-02 NOTE — Discharge Instructions
A NOTE FROM YOUR DOCTOR    You were admitted for chronic abdominal pain and diarrhea. You were evaluated by the GI doctors and underwent EGD that did not show any acute abnormality on your pathology. You were also seen by the vascular surgeons for concern of decreased blood flow to your abdomen causing your symptoms and they wanted to follow up with you as an outpatient. You intermittently needed oxygen and heart and lung imaging showed normal heart function and no evidence of blood clots. You had evidence of low blood pressure but you did not have any symptoms and your heart function was normal. Your diarrhea improved by the time of discharge.    These are IMPORTANT CHANGES to your MEDICATIONS:  1.  START atorvastatin 80 to decrease cholesterol as you had some narrowing in the blood vessels to your abdominal area  2.  Gabapentin and methocarbamol in addition to your home percocet for pain  3. Please take miralax and senna while you are on opiates to avoid constipation - your imaging showed that you had a lot of stool after your diarrhea resolved    Follow-Up Appointments:     It is important that you follow up with your doctor(s) in the coming weeks:       1. Post-discharge follow-up appointment with primary care doctor within 7 days - to check on your recovery and review important changes from this hospitalization.         The following appointments still need to be coordinated. We have schedulers who will help you make these appointments.      GI doctors for evaluation for colonoscopy  Vascular Surgery: Please call [phone] to set up a clinic visit following your colonoscopy.     - If you do not hear from them in the next 2-3 days or need additional assistance, please call our [facility] hospitalist administrative team [facility] [phone]; [facility] [phone]) or our referral service at [phone]) or go to [email or web address]     If you feel like your illness/symptoms are worsening and need urgent evaluation (i.e. you cannot wait until your follow up visits or your primary care physician cannot see you):   - You can get same day medical attention at an urgent care (find a [place] urgent care office at [email or web address])  - If you need more serious immediate care, please return to the ER.    Thank you,  [facility]

## 2023-02-03 LAB — Basic Metabolic Panel
SODIUM: 137 mmol/L (ref 135–146)
UREA NITROGEN: 16 mg/dL (ref 7–22)

## 2023-02-03 LAB — CBC: RED CELL DISTRIBUTION WIDTH-SD: 60.1 fL — ABNORMAL HIGH (ref 36.9–48.3)

## 2023-02-03 MED ORDER — ONDANSETRON 4 MG PO TBDP
4 mg | ORAL_TABLET | Freq: Four times a day (QID) | ORAL | 0 refills | 14.00 days | Status: AC
Start: 2023-02-03 — End: ?

## 2023-02-03 MED ORDER — OXYCODONE-ACETAMINOPHEN 7.5-325 MG PO TABS
1 | ORAL_TABLET | Freq: Four times a day (QID) | ORAL | 0 refills | 8.00000 days | Status: AC | PRN
Start: 2023-02-03 — End: ?
  Filled 2023-02-04: qty 30, 8d supply, fill #0

## 2023-02-03 MED ORDER — FLUTICASONE PROPIONATE HFA 220 MCG/ACT IN AERO
1 | Freq: Every day | RESPIRATORY_TRACT | 0 refills | 30.00000 days | Status: AC
Start: 2023-02-03 — End: ?
  Filled 2023-02-04: qty 12, 30d supply, fill #0

## 2023-02-03 MED ORDER — METHOCARBAMOL 500 MG PO TABS
500 mg | ORAL_TABLET | Freq: Three times a day (TID) | ORAL | 0 refills | 30.00 days | Status: AC
Start: 2023-02-03 — End: ?

## 2023-02-03 MED ORDER — NALOXONE HCL 4 MG/0.1ML NA LIQD
1 refills | 1 days | Status: AC
Start: 2023-02-03 — End: ?
  Filled 2023-02-04: qty 2, 1d supply, fill #0

## 2023-02-03 MED ORDER — POLYETHYLENE GLYCOL 3350 17 GM/SCOOP PO POWD
17 g | Freq: Every day | ORAL | 0 refills | 30.00 days | Status: AC
Start: 2023-02-03 — End: ?
  Filled 2023-02-04 (×2): qty 510, 30d supply, fill #0

## 2023-02-03 MED ORDER — HYOSCYAMINE SULFATE 0.125 MG PO TABS
125 ug | ORAL_TABLET | ORAL | 0 refills | 5.00 days | Status: AC | PRN
Start: 2023-02-03 — End: ?
  Filled 2023-02-04: qty 30, 5d supply, fill #0

## 2023-02-03 MED ORDER — SENNOSIDES 8.6 MG PO TABS
1 | ORAL_TABLET | Freq: Every evening | ORAL | 0 refills | 30.00000 days | Status: AC
Start: 2023-02-03 — End: ?
  Filled 2023-02-04: qty 30, 30d supply, fill #0

## 2023-02-03 MED ORDER — PANTOPRAZOLE SODIUM 40 MG PO TBEC
40 mg | ORAL_TABLET | Freq: Two times a day (BID) | ORAL | 0 refills | 30.00 days | Status: AC
Start: 2023-02-03 — End: ?

## 2023-02-03 MED ORDER — GABAPENTIN 100 MG PO CAPS
100 mg | ORAL_CAPSULE | Freq: Three times a day (TID) | ORAL | 0 refills | 30.00 days | Status: AC
Start: 2023-02-03 — End: ?

## 2023-02-03 MED ORDER — CHOLECALCIFEROL 25 MCG (1000 UT) PO TABS
25 ug | ORAL_TABLET | Freq: Every day | ORAL | 0 refills | 30.00000 days | Status: AC
Start: 2023-02-03 — End: ?

## 2023-02-03 MED ORDER — ATORVASTATIN CALCIUM 80 MG PO TABS
80 mg | ORAL_TABLET | Freq: Every day | ORAL | 0 refills | 30.00000 days | Status: AC
Start: 2023-02-03 — End: ?
  Filled 2023-02-04: qty 30, 30d supply, fill #0

## 2023-02-03 MED ADMIN — OXYCODONE HCL 5 MG PO TABS: 15 mg | ORAL | @ 13:00:00 | Stop: 2023-02-04 | NDC 68084035411

## 2023-02-03 MED ADMIN — ONDANSETRON 4 MG PO TBDP: 4 mg | ORAL | @ 11:00:00 | Stop: 2023-02-04

## 2023-02-03 MED ADMIN — ONDANSETRON 4 MG PO TBDP: 4 mg | ORAL | @ 20:00:00 | Stop: 2023-02-04

## 2023-02-03 MED ADMIN — POLYETHYLENE GLYCOL 3350 17 G PO PACK: 17 g | ORAL | @ 17:00:00 | Stop: 2023-02-04

## 2023-02-03 MED ADMIN — ACETAMINOPHEN 500 MG PO TABS: 1000 mg | ORAL | @ 06:00:00 | Stop: 2023-02-04

## 2023-02-03 MED ADMIN — NITROGLYCERIN 0.4 % RE OINT: 1 [in_us] | RECTAL | @ 06:00:00 | Stop: 2023-02-04

## 2023-02-03 MED ADMIN — IOHEXOL 350 MG/ML IV SOLN: 75 mL | INTRAVENOUS | @ 02:00:00 | Stop: 2023-02-03 | NDC 00407141490

## 2023-02-03 MED ADMIN — GABAPENTIN 100 MG PO CAPS: 100 mg | ORAL | @ 13:00:00 | Stop: 2023-02-04 | NDC 60687058011

## 2023-02-03 MED ADMIN — ZZ IMS TEMPLATE: 150 mg | ORAL | @ 06:00:00 | Stop: 2023-02-04 | NDC 60687034911

## 2023-02-03 MED ADMIN — VITAMIN B-12 500 MCG PO TABS: 1000 ug | ORAL | @ 17:00:00 | Stop: 2023-02-04 | NDC 77333093725

## 2023-02-03 MED ADMIN — CHOLESTYRAMINE 4 G PO PACK: 4 g | ORAL | @ 20:00:00 | Stop: 2023-02-04

## 2023-02-03 MED ADMIN — OXYCODONE HCL 5 MG PO TABS: 15 mg | ORAL | @ 03:00:00 | Stop: 2023-02-04 | NDC 68084035411

## 2023-02-03 MED ADMIN — NICOTINE 14 MG/24HR TD PT24: 14 mg | TRANSDERMAL | @ 17:00:00 | Stop: 2023-02-04 | NDC 00536589588

## 2023-02-03 MED ADMIN — ONDANSETRON 4 MG PO TBDP: 4 mg | ORAL | @ 02:00:00 | Stop: 2023-02-04 | NDC 57237007710

## 2023-02-03 MED ADMIN — HYOSCYAMINE SULFATE 0.125 MG PO TABS: 125 ug | ORAL | @ 06:00:00 | Stop: 2023-02-04 | NDC 42192034001

## 2023-02-03 MED ADMIN — HYOSCYAMINE SULFATE 0.125 MG PO TABS: 125 ug | ORAL | @ 11:00:00 | Stop: 2023-02-04

## 2023-02-03 MED ADMIN — ATORVASTATIN CALCIUM 40 MG PO TABS: 80 mg | ORAL | @ 17:00:00 | Stop: 2023-02-04 | NDC 68084009911

## 2023-02-03 MED ADMIN — HYOSCYAMINE SULFATE 0.125 MG PO TABS: 125 ug | ORAL | @ 13:00:00 | Stop: 2023-02-04 | NDC 42192034001

## 2023-02-03 MED ADMIN — GABAPENTIN 100 MG PO CAPS: 100 mg | ORAL | @ 06:00:00 | Stop: 2023-02-04 | NDC 60687058011

## 2023-02-03 MED ADMIN — SENNOSIDES 8.6 MG PO TABS: 1 | ORAL | @ 06:00:00 | Stop: 2023-02-04 | NDC 00904725261

## 2023-02-03 MED ADMIN — CHOLESTYRAMINE 4 G PO PACK: 4 g | ORAL | @ 06:00:00 | Stop: 2023-02-07

## 2023-02-03 MED ADMIN — ACETAMINOPHEN 500 MG PO TABS: 1000 mg | ORAL | @ 20:00:00 | Stop: 2023-02-04

## 2023-02-03 MED ADMIN — VITAMIN D3 25 MCG (1000 UT) PO TABS: 25 ug | ORAL | @ 17:00:00 | Stop: 2023-02-04

## 2023-02-03 MED ADMIN — ZZ IMS TEMPLATE: 150 mg | ORAL | @ 17:00:00 | Stop: 2023-02-04 | NDC 60687034911

## 2023-02-03 MED ADMIN — PANCRELIPASE (LIP-PROT-AMYL) 36000-114000 UNITS PO CPEP: 36000 [IU] | ORAL | @ 17:00:00 | Stop: 2023-02-04 | NDC 00032301613

## 2023-02-03 MED ADMIN — HYOSCYAMINE SULFATE 0.125 MG PO TABS: 125 ug | ORAL | @ 18:00:00 | Stop: 2023-02-04 | NDC 42192034001

## 2023-02-03 MED ADMIN — B COMPLEX PO CAPS: 1 | ORAL | @ 17:00:00 | Stop: 2023-02-04

## 2023-02-03 MED ADMIN — METHOCARBAMOL 500 MG PO TABS: 500 mg | ORAL | @ 13:00:00 | Stop: 2023-02-04 | NDC 60687055911

## 2023-02-03 MED ADMIN — HYOSCYAMINE SULFATE 0.125 MG PO TABS: 125 ug | ORAL | @ 20:00:00 | Stop: 2023-02-04

## 2023-02-03 MED ADMIN — CHOLESTYRAMINE 4 G PO PACK: 4 g | ORAL | @ 13:00:00 | Stop: 2023-02-04

## 2023-02-03 MED ADMIN — DULOXETINE HCL 30 MG PO CPEP: 30 mg | ORAL | @ 17:00:00 | Stop: 2023-02-04 | NDC 00904704461

## 2023-02-03 MED ADMIN — PANCRELIPASE (LIP-PROT-AMYL) 36000-114000 UNITS PO CPEP: 36000 [IU] | ORAL | @ 20:00:00 | Stop: 2023-02-04

## 2023-02-03 MED ADMIN — NITROGLYCERIN 0.4 % RE OINT: 1 [in_us] | RECTAL | @ 20:00:00 | Stop: 2023-02-04

## 2023-02-03 MED ADMIN — PANTOPRAZOLE SODIUM 40 MG PO TBEC: 40 mg | ORAL | @ 06:00:00 | Stop: 2023-02-04 | NDC 60687073609

## 2023-02-03 MED ADMIN — GABAPENTIN 100 MG PO CAPS: 100 mg | ORAL | @ 20:00:00 | Stop: 2023-02-04

## 2023-02-03 MED ADMIN — PANTOPRAZOLE SODIUM 40 MG PO TBEC: 40 mg | ORAL | @ 17:00:00 | Stop: 2023-02-04 | NDC 60687073609

## 2023-02-03 MED ADMIN — TRAZODONE HCL 100 MG PO TABS: 100 mg | ORAL | @ 06:00:00 | Stop: 2023-02-04 | NDC 60687045411

## 2023-02-03 MED ADMIN — METHOCARBAMOL 500 MG PO TABS: 500 mg | ORAL | @ 20:00:00 | Stop: 2023-02-04

## 2023-02-03 MED ADMIN — OXYCODONE HCL 5 MG PO TABS: 10 mg | ORAL | @ 20:00:00 | Stop: 2023-02-04 | NDC 68084035411

## 2023-02-03 MED ADMIN — OXYCODONE HCL 5 MG PO TABS: 15 mg | ORAL | @ 07:00:00 | Stop: 2023-02-04 | NDC 68084035411

## 2023-02-03 MED ADMIN — LIDOCAINE 5 % EX PTCH: 1 | TRANSDERMAL | @ 06:00:00 | Stop: 2023-02-04

## 2023-02-03 MED ADMIN — PANCRELIPASE (LIP-PROT-AMYL) 36000-114000 UNITS PO CPEP: 36000 [IU] | ORAL | @ 02:00:00 | Stop: 2023-02-04 | NDC 00032301613

## 2023-02-03 MED ADMIN — ENOXAPARIN SODIUM 40 MG/0.4ML IJ SOSY: 40 mg | SUBCUTANEOUS | @ 17:00:00 | Stop: 2023-02-04

## 2023-02-03 MED ADMIN — FLUTICASONE PROPIONATE HFA 220 MCG/ACT IN AERO: 1 | RESPIRATORY_TRACT | @ 17:00:00 | Stop: 2023-02-04 | NDC 66993008096

## 2023-02-03 MED ADMIN — NICOTINE 14 MG/24HR TD PT24: 14 mg | TRANSDERMAL | @ 17:00:00 | Stop: 2023-02-04

## 2023-02-03 MED ADMIN — ACETAMINOPHEN 500 MG PO TABS: 1000 mg | ORAL | @ 13:00:00 | Stop: 2023-02-04 | NDC 00904673061

## 2023-02-03 MED ADMIN — ONDANSETRON 4 MG PO TBDP: 4 mg | ORAL | @ 13:00:00 | Stop: 2023-02-04

## 2023-02-03 MED ADMIN — METHOCARBAMOL 500 MG PO TABS: 500 mg | ORAL | @ 06:00:00 | Stop: 2023-02-04 | NDC 60687055911

## 2023-02-03 MED ADMIN — HYOSCYAMINE SULFATE 0.125 MG PO TABS: 125 ug | ORAL | @ 02:00:00 | Stop: 2023-02-04 | NDC 42192034001

## 2023-02-03 NOTE — Discharge Summary
Discharge Summary   Name: Donna Gross MRN: [id number] DOB: 09/04/1966   Admit Date: 01/25/2023 D/C Date:    LOS:   9 days    Admit Attending: Jaclyn Shaggy, MD Discharge Attending: Miguel Dibble., MD    PCP: Kelle Darting, MD Discharge Provider: Miguel Dibble, MD     Inpatient Care Team/ Consults:    GI  Vascular surgery  [facility] Care Team  No care team member to display     Admission Diagnosis:  (reason for admission)  Abdominal pain Discharge Diagnosis:  (conditions treated during hospitalization)  Chronic abdominal pain  Severe protein calorie malnutrition  Hx pancreatitis  Diarrhea  Intermittent hypoxia  Intermittent asymptomatic hypotension  AKI  Anxiety  Depression  Fibromyalgia  Insomnia  Chronic mesenteric ischemia   Disposition:  Home or Self care      Discharge Condition:  stable       HPI:   From H&P 2/5:  ''Donna Gross is a 57 y.o. female with hx of anxiety, depression, emphysema, fibromyalgia, gastritis, GERD, colon surgery and pancreatitis who presents to the ED with complaint of abdominal pain with associated nausea, poor PO intake and diarrhea x6 days.       Pt denies cough, fever, SOB and leg swelling. No known sick contacts. Lab work in [facility] ED overall unremarkable H/H 11.3/35.6, Plts 442K, Na 136, Creat 1.3 and Lipase 32. CT Abdomen pelvis obtained demonstrating signs of colitis, for which patient was initiated on empiric ceftriaxone/flagyl. CT imaging shows no acute CT abnormality in the abdomen/pelvis with stable biliary dilatation unchanged per previous imaging.     Patient is s/p cholecystectomy on 03/28/22 during which time a biliary stent was placed.  Biliary stent was subsequently removed 04/2023 without incident.  Patient reports ongoing, chronic abdominal pain with associated poor PO intake and intermittent diarrhea since that time which has required two discrete hospital admissions (07/23, 12/23) for ongoing symptoms.  Patient also reports 50lb weight loss over past year.[facility] Course:    Patient admitted for chronic abdominal pain and new diarrhea.  She underwent CT that did not show any acute abnormality.  GI was consulted who recommended EGD and colonoscopy.  Patient was unable to tolerate colonoscopy prep and patient only underwent EGD, with pathology showing reactive antral gastropathy without evidence of infection or malignancy. Infectious stool studies negative including H pylori, celiac and bacterial/parasite panels. Diarrhea resolved the patient continued to have chronic abdominal pain, worse postprandially.  CTA of the abdomen obtained showing narrowing of the SMA concerning for chronic mesenteric ischemia, vascular surgery consulted and recommended follow-up as outpatient for consideration of revascularization.  She was started on atorvastatin to reduce cholesterol plaques.  Patient was also noted to have intermittent episodes of asymptomatic hypotension and intermittent hypoxia while in the hospital.  Her lactate was normal, TTE with normal EF and no abnormalities, CTA of the chest without evidence of pulmonary embolism.  Blood pressure improved and patient without oxygen requirement on day of discharge she was discharged home in stable condition with recommendation to follow-up with GI and vascular surgery as an outpatient.    Procedures & Operations Performed:   EGD 2/8  FINDINGS:   The esophagus and GE junction were normal.  The gastric pouch was mildly erythematous with bilious fluid noted.  Billroth 2 anatomy was noted.  The GJ anastamosis appeared normal. The efferent and afferent limbs were intubated a short distance and both   appeared normal.  Biopsy taken with cold biopsy forceps from the stomach.     DIAGNOSIS:  Mild gastric erythema, otherwise normal endoscopy with normal Billroth 2 anatomy    Pathology  STOMACH (BIOPSY):  - Reactive antral gastropathy  - No H. pylori  - No intestinal metaplasia    Relevant Imaging Studies (most recent results only):   L  Last Echo Adult: Echo adult transthoracic complete    Result Date: 02/02/2023   1. Normal left ventricular size.  2. Normal LV regional wall motion.  3. LV systolic function is normal. LV ejection fraction is approximately 60 to 65%.  4. The calculated ejection fraction (Simpson's) is 66 %.  5. Normal LV diastolic function.  6. There is no significant valvular dysfunction.  7. Normal right ventricle in size. Normal RV systolic function.  8. Borderline elevated PA systolic pressure at 38 mmHg.  9. Normal right atrial pressure. 10. There is no pericardial effusion. 11. The proximal ascending aorta measures 32 mm (index 22 mm/m?). 12. Compared to prior study on 01/27/2020, there are no significant changes. [id number] Renelda Loma MD Electronically signed by [id number] Renelda Loma MD on 02/02/2023 at 12:01:31 PM  Sonographer: Van Clines    Final      Last CT Chest: CT chest angiogram w contrast    Result Date: 02/02/2023  IMPRESSION: No pulmonary embolism.  Small bilateral pleural effusions with adjacent subsegmental atelectasis. Advanced calcified coronary atherosclerosis.  Signed by: Roetta Sessions   02/02/2023 8:09 PM       Last CT Abdomen/Pelvis: CT abd+pelvis angiogram wo+w contrast    Result Date: 01/31/2023  IMPRESSION: 1.  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 2.  Large stool volume. 3.  Volume overload with small bilateral pleural effusions and diffuse body wall edema. IArdeen Fillers, M.D., have reviewed this radiological study personally and I am in full agreement with the findings of the report presented here. Dictated by: Orson Aloe   01/31/2023 5:42 PM Signed by: Ardeen Fillers   01/31/2023 9:27 PM     Last US Abdomen: [place] abd right upper quadrant non-vascular    Result Date: 02/13/2022  IMPRESSION: 1. Sludge and small stones in the gallbladder, no sonographic evidence of acute cholecystitis. 2. Borderline prominence of the common duct and central intrahepatic biliary tree. Hypoechoic structure in the distal common duct seen on single image, question sludge/stones versus artifact. Correlate with bilirubin levels and if indicated MRCP for further evaluation. Signed by: Albin Fischer   02/13/2022 10:07 AM   Last US Kidney: No US Kidney in the last 365 days    Relevant labs:     CBC:  Lab Results   Component Value Date    WBC 5.56 02/02/2023    HGB 9.2 (L) 02/02/2023    HCT 29.7 (L) 02/02/2023    PLT 301 02/02/2023     LFT:  No results found for: ''TOTPRO'', ''ALBUMIN'', ''BILITOT'', ''ALKPHOS'', ''AST'', ''ALT''    BMP:  Lab Results   Component Value Date    NA 137 02/02/2023    K 4.9 02/02/2023    CL 100 02/02/2023    CO2 28 02/02/2023    BUN 16 02/02/2023    CREAT 1.18 02/02/2023    CALCIUM 8.8 02/02/2023    GLUCOSE 80 02/02/2023       Cal/iCal/MG/Ph:  Lab Results   Component Value Date    CALCIUM 8.8 02/02/2023    MG 1.2 (  L) 01/28/2023     Coags:  No results found for: ''PT'', ''APTT'', ''INR''          Last BNP:   Lab Results   Component Value Date    BNP 215 (H) 02/01/2023           Physical Exam:   BP 104/75 (BP Location: Left arm, Patient Position: Lying)  ~ Pulse 79  ~ Temp 36.8 ?C (98.2 ?F) (Oral)  ~ Resp 18  ~ Ht 1.626 m (5' 4'')  ~ Wt 45.4 kg (100 lb)  ~ SpO2 97%  ~ BMI 17.16 kg/m?   General appearance: alert, appears stated age, and cooperative  Neck: supple, symmetrical, trachea midline  Lungs: clear to auscultation bilaterally  Heart: regular rate and rhythm, S1, S2 normal, no murmur, click, rub or gallop  Abdomen: Nondistended, diffusely TTP  Extremities: extremities normal, atraumatic, no cyanosis or edema  Skin: Skin color, texture, turgor normal. No rashes or lesions  Neurologic: Grossly normal       Outpatient Provider To-Do List  [ ]  routine PCP follow up  [ ]  GI follow up in 4 weeks for colonoscopy  [ ]  vascular surgery follow up post colonoscopy for consideration of intervention for chronic mesenteric ischemia       LACE+ Score: 60 (02/03/23 0801) High Risk of Readmission  Better Outcomes by Optimizing Safe Transitions        This patient meets criteria for the following BOOST risk conditions which could lead to readmission. Please consider further evaluation or action on the potential barriers below:    Problems with Medications:      17 scheduled medications active at discharge  High-risk active medications: gabapentin 100 mg capsule [[id number]]              Pending Labs   The following tests have been collected, but not yet resulted at the time of discharge.    Unresulted Labs (From admission, onward)      None          The following labs have a preliminary result at the time of discharge.  Please check back if the final results have changed.  Preliminary Resulted Labs (From admission, onward)      None          Discharge Medication List  Coumadin and Opiate Risk Assessment   This patient does not have coumadin on their discharge med list  Controlled meds:   Controlled Medications       Opioid Combinations Disp Start End     oxyCODONE-acetaminophen 7.5-325 MG per tablet    30 tablet 02/03/2023 02/13/2023    Sig - Route: Take 1 tablet by mouth every six (6) hours as needed for Pain. Max Daily Amount: 4 tablets - Oral    Earliest Fill Date: 02/03/2023     oxyCODONE-acetaminophen (PERCOCET) 7.5-325 MG per tablet (Discontinued)      02/03/2023    Sig - Route: Take 1 tablet by mouth every six (6) hours as needed for Pain. Max Daily Amount: 4 tablets - Oral    Class: Historical Med           Current Discharge Medication List        START taking these medications    Details   atorvastatin 80 mg tablet Take 1 tablet (80 mg total) by mouth daily.  Qty: 30 tablet, Refills: 0      gabapentin 100 mg capsule Take 1 capsule (100 mg total) by mouth  three (3) times daily.  Qty: 90 capsule, Refills: 0      hyoscyamine 0.125 mg tablet Take 1 tablet (125 mcg total) by mouth every four (4) hours as needed for Cramping.  Qty: 30 tablet, Refills: 0      methocarbamol 500 mg tablet Take 1 tablet (500 mg total) by mouth three (3) times daily.  Qty: 90 tablet, Refills: 0      naloxone 4 mg/0.1 mL nasal spray Call 911. Administer a single spray intranasally into one nostril for opioid overdose. May repeat in 3 minutes if patient is not breathing.Rob Bunting: 2 each, Refills: 1      ondansetron ODT 4 mg disintegrating tablet Take 1 tablet (4 mg total) by mouth every six (6) hours for 14 days.  Qty: 56 tablet, Refills: 0      polyethylene glycol powder packet Take 1 packet (17 g total) by mouth daily.  Qty: 30 packet, Refills: 0      senna 8.6 mg tablet Take 1 tablet by mouth at bedtime.  Qty: 30 tablet, Refills: 0           CONTINUE these medications which have CHANGED    Details   fluticasone 220 mcg/act inhaler Inhale 1 puff daily.  Qty: 12 g, Refills: 0      oxyCODONE-acetaminophen 7.5-325 MG per tablet Take 1 tablet by mouth every six (6) hours as needed for Pain. Max Daily Amount: 4 tablets  Qty: 30 tablet, Refills: 0      pantoprazole 40 mg DR tablet Take 1 tablet (40 mg total) by mouth two (2) times daily.  Qty: 60 tablet, Refills: 0      vitamin D, cholecalciferol, 25 mcg (1000 units) tablet Take 1 tablet (25 mcg total) by mouth daily.  Qty: 30 tablet, Refills: 0           CONTINUE these medications which have NOT CHANGED    Details   acetaminophen 500 mg tablet Take 2 tablets (1,000 mg total) by mouth every six (6) hours as needed for Pain.      albuterol 90 mcg/act inhaler Inhale 2 puffs every six (6) hours as needed for Wheezing or Shortness of Breath.      B Complex Vitamins (VITAMIN B COMPLEX) capsule Take 1 capsule by mouth daily.      cholestyramine 4 g/dose powder Take 1 packet (4 g total) by mouth three (3) times daily with meals.  Qty: 378 g, Refills: 3      clobetasol 0.05% cream Apply topically three (3) times daily as needed (rash).      cyanocobalamin 1000 mcg tablet Take 1 tablet (1,000 mcg total) by mouth daily.      DULoxetine 30 mg DR capsule Take 1 capsule (30 mg total) by mouth daily.      fluticasone-salmeterol 250-50 mcg/act diskus inhaler Inhale 1 puff two (2) times daily.      ipratropium-albuterol 20-100 mcg/act inhaler Inhale 1 puff every six (6) hours as needed for Wheezing or Shortness of Breath.      ondansetron 4 mg tablet Take 1 tablet (4 mg total) by mouth every six (6) hours as needed for Nausea.  Qty: 12 tablet, Refills: 0      pancrelipase, Lip-Prot-Amyl, (CREON) 36000 units DR capsule Take 1 capsule (36,000 units of lipase total) by mouth three (3) times daily with meals.      QUEtiapine Fumarate (SEROQUEL PO) Take 150 mg by mouth two (2) times daily.  Requested Appointments   We will schedule follow up appointment(s) and call you with appointment   time(s)   As directed      Is the patient being discharged to SNF? No  Clinic/Test/Procedure to be scheduled:PMD  Doctor Preferences (If applicable): Kelle Darting, MD  Time Frame: Within 1 week  Reason for Appointment/Procedure: Follow up recent hospitalization for   abdominal pain, diarrhea, s/p EGD    Clinic/Test/Procedure to be scheduled:GI  Doctor Preferences (If applicable): First available   Time Frame: Within 4 weeks  Reason for Appointment/Procedure: Follow up recent hospitalization for   abdominal pain, diarrhea, s/p EGD, follow up for colonoscopy, could   consider SIBO testing    Clinic/Test/Procedure to be scheduled:Vascular surgery  Doctor Preferences (If applicable): First available   Time Frame: Within 4 weeks  Reason for Appointment/Procedure: chronic mesenteric ischemia, consider   surgical candidacy      Scheduled Appointments  No future appointments.       Nutrition Recommendations & Malnutrition Assessment   I have seen and examined the patient and agree with the RD assessment detailed below:     Patient meets criteria for: Severe protein calorie malnutrition    (current weight 45.4 kg (100 lb), BMI (Calculated): 17.16;  ,  ).   See RD notes for additional details.     Continue with low fiber diet and cater to pt's preferences   Continue with current vitamin and mineral supplementation    - cyanocobalamin 1,91mcg PO daily   - vit B complex tab PO daily    - cholecalciferol PO daily   Will send Boost Breeze TID per pt request  Consider recheck Vit B1, Vit B12, and Vitamin D levels  Monitor Mg levels and replete as needed      Author:  Vick Frees, RD Pager [phone]  01/29/2023 1:40 PM     Level of Malnutrition: Severe protein calorie malnutrition  Discharge Diet Orders:  Low fiber: less than 15 grams of fiber per day. Avoid most fresh fruits   and vegetables and whole grains and nuts, seeds and beans/lentils   As   directed        Skin/Wound         Discharge Activity Orders    Okay to be physically active   As directed      When getting up from bed, sit on the side of the bed for 1 minute before   standing up.  Then when you stand up wait a minute before trying to walk.   Taking your time getting up helps prevent dizziness   As directed        Rehab Assessment   Inpatient Recommendation: PT evaluate & discharge;AMB with nursing;No acute care PT needs (01/27/23 8119)  Recommendation  Discharge Recommendation: No further therapy needs (01/27/23 1478)  Discharge concerns: None noted (01/27/23 0917)  Discharge Equipment Recommended: None (01/27/23 2956)  Equipment ordered: Not applicable (01/27/23 2130)  No OT evaluation this encounter        Case Manager/Home Health Assessment   Discharge Information  Discharge Address: [address]  [place] [address] (02/01/23 1335)              Home Health Orders  There are no active discharge home health orders for this encounter.   Goals of Care Note (if new for this encounter)     I have seen and examined the patient on their discharge day. I  have reviewed, edited, and agree with the above discharge summary. More than 30 minutes was personally spent on discharge planning on the day of discharge in coordination of care, counseling the patient and preparation of discharge.  Miguel Dibble, MD  02/03/2023 8:26 AM       Voice Recognition Disclaimer - Due to possible errors in transcription, there may be errors in documentation: Due to voice recognition software, sound alike and misspelled words may be contained in the documentation. Portions of this chart may have been created with [employer]ie [employer]- [employer] Voice Recognition Software. Occasional wrong-word or sound-alike substitutions may have occurred due to the inherent limitations of voice recognition software. Please read the chart carefully and recognize, using context, where these substitutions may have occurred.

## 2023-02-03 NOTE — Other
Patient's Clinical Goal:   Clinical Goal(s) for the Shift: Pain mgt, safety, VSS, comfort, rest  Identify possible barriers to advancing the care plan:   Stability of the patient: Moderately Unstable - medium risk of patient condition declining or worsening    Progression of Patient's Clinical Goal:   Problem/Goal:   Abdominal pain  Response to Plan of Care and any changes in condition (clinical condition, any concerning assessments, events, interventions; response to POC effectiveness or changes made):   Abdominal pain 9/10 throughout shift managed with mild effect with oxy 38m. No BM this shift. Remains hypotensive but asymptomatic. Independent with ADLs. nutrition consult ordered.   Interdisciplinary communication (team member communication):   Psychosocial communication (family or patient issues potentially affecting care):   Plan and Disposition (progression toward specific goals of care/hospitalization and discharge):  DC possibly today

## 2023-02-03 NOTE — Progress Notes
INTERNAL MEDICINE - INPATIENT PROGRESS NOTE    ADMISSION DATE: 01/25/2023    DATE OF SERVICE: 02/02/2023  HOSPITAL DAY: 8    PRINCIPLE PROBLEM: Abdominal pain    CC: Abdominal Pain (Abdominal pain, vomiting and diarrhea x6 days Denies fever. )    INTERVAL EVENTS AND REVIEW OF SYSTEMS:  No acute events overnight. Hypotensive again this morning, but asymptomatic. TTE w/normal cardiac function. Occasionally requiring O2, D dimer elevated and will get CTA.    MEDICATIONS:  acetaminophen, 1,000 mg, Oral, TID  atorvastatin, 80 mg, Oral, Daily  cholestyramine, 4 g, Oral, TID  cyanocobalamin, 1,000 mcg, Oral, Daily  DULoxetine, 30 mg, Oral, Daily  enoxaparin, 40 mg, Subcutaneous, Daily  fluticasone, 1 puff, Inhalation, Daily  gabapentin, 100 mg, Oral, TID  hyoscyamine, 125 mcg, Oral, Q4H  lidocaine, 1 patch, Transdermal, Q24H  methocarbamol, 500 mg, Oral, TID  nicotine, 1 patch, Transdermal, Daily  nitroglycerin, 1 inch, Rectal, Q12H  ondansetron ODT, 4 mg, Oral, Q6H  pancrelipase (Lip-Prot-Amyl), 36,000 units of lipase, Oral, TID w/meals  pantoprazole, 40 mg, Oral, BID  polyethylene glycol, 17 g, Oral, Daily  QUEtiapine, 150 mg, Oral, BID  senna, 1 tablet, Oral, QHS  vitamin B complex, 1 capsule, Oral, Daily  vitamin D (cholecalciferol), 25 mcg, Oral, Daily  PRNs: albuterol, aluminum-magnesium hydroxide-simethicone, bisacodyl, lidocaine, lidocaine PF, loperamide, oxyCODONE **OR** oxyCODONE, prochlorperazine, sodium chloride, traZODone    VITALS:  Temp:  [36.4 ?C (97.6 ?F)-36.9 ?C (98.4 ?F)] 36.9 ?C (98.4 ?F)  Heart Rate:  [58-92] 92  Resp:  [16-20] 20  BP: (71-108)/(50-73) 71/55  NBP Mean:  [58-84] 62  SpO2:  [91 %-100 %] 96 %     Weight: 45.4 kg (100 lb) Oxygen Therapy  SpO2: 96 %  O2 Device: None (Room air)  Flow Rate (L/min): 3 L/min     IN'S AND OUT'S:  I/O last 2 completed shifts:  In: 760 [P.O.:760]  Out: 2450 [Urine:2450]    PHYSICAL EXAM:  General: alert, well appearing, and in no distress.   Lungs: normal work of breathing  Abdomen: soft, diffuse discomfort with palpation, non distended  Neuro: fully oriented, no focal deficit    DATA  I have reviewed the following information from the last 24 hours: allied health and treating physician notes, imaging, labs and microbiology data, and cardiac studies and telemetry data (if on monitor)  Na/K/Cl/CO2/BUN/Cr/glu:  137/4.9/100/28/16/1.18/80 (02/13 1610)     WBC/Hgb/Hct/Plts:  5.56/9.2/29.7/301 (02/13 9604)        Pathology  STOMACH (BIOPSY):  - Reactive antral gastropathy  - No H. pylori  - No intestinal metaplasia    TTE 2/13  CONCLUSIONS   1. Normal left ventricular size.   2. Normal LV regional wall motion.   3. LV systolic function is normal. LV ejection fraction is approximately 60 to 65%.   4. The calculated ejection fraction (Simpson's) is 66 %.   5. Normal LV diastolic function.   6. There is no significant valvular dysfunction.   7. Normal right ventricle in size. Normal RV systolic function.   8. Borderline elevated PA systolic pressure at 38 mmHg.   9. Normal right atrial pressure.  10. There is no pericardial effusion.  11. The proximal ascending aorta measures 32 mm (index 22 mm/m?).  12. Compared to prior study on 01/27/2020, there are no significant changes.      ASSESSMENT:  Donna Gross is a 57 y.o. female with hx of anxiety, depression, emphysema, fibromyalgia, gastritis, GERD, colon surgery and pancreatitis  who presents to the ED with complaint of abdominal pain s/p abdominal surgery on 5/3. Pt had a cholecystectomy on 4/8, was treated for an infection, and admitted from 5/3-5/8 for ERCP and biliary stent placement who now presents with and is admitted with acute on chronic abdominal pain.     #Abdominal Pain, POA   #Diarrhea- POA, chronic  #hx of pancreatitis  #Severe protein calorie malnutrition, POA  Patient recently underwent CT imaging without acute pathologic findings. C diff, H pylori, celiac studies, parasite enteric panel negative. CTA with narrowing of SMA.  - Tylenol/Oxy/Dilaudid for pain, prn topical lidocaine for rectal pain, nitroglycerin rectal ointment BID   - C/S to Nutrition Services   - Zofran 4 mg q6h  - stool studies: f/u calprotection, pancreatic elastase  - PPI BID  - continue home pancrelipase (creon ) 36,000 u TID with meals  - continue home cholestyramine 4g TID  - s/p EDG 2/8 without acute source, s/p bx - biopsy w/reactive antral gastropathy, no H pylori or intestinal metaplasia  - patient unable to tolerate prep for colonoscopy, will plan for outpatient   - EW med consult: concern for myofascial component to pain. Patient declined acupuncture/trigger  Pain control   - tylenol 1000 TID    - gabapentin 100 TID - added 2/11   - methocarbamol 500 TID - added 2/11   - oxy 10/15 q4h mod/severe pain    - stop dilaudid 0.2 IV q4h PRN breakthrough  - vascular surgery consulted for SMA narrowing - outpatient follow up  - atorvastatin 80 nightly started for atherosclerotic disease in SMA    #body wall edema  #elevated BNP  #hypoxia requiring intermittent NC  #hypotension  Body wall edema noted on CTA. Patient with normal liver and kidney function and appearance on imaging. TTE w/normal EF, normal EF and RAP. S/p lasix 2/12. Noted to have intermittent hypotension though asymptomatic - lactate normal. D dimer positive  - f/u CTA given intermittent hypoxia to r/o PE    #AKI- improved  - Baseline creat likely 0.8-1.1; admission Creat 1.3  - suspect AKI in setting of poor PO intake/diarrhea  - monitor BMP; avoid nephrotoxic agents     #Anxiety, POA   #Depression, POA   #Fibromyalgia   #Insomnia,   - continue Seroquel 150mg  BID  - Continue duloxetine 40 mg qday   -Trazodone 100 mg at bedtime PRN     #Tobacco Dependence - Nicotine patch         INPATIENT CHECKLIST:  Diet: low fiber diet  VTE Prophylaxis: enoxaparin  Central Lines: none  Tubes/Drains: none  Activity: as tolerated  Precautions: No infectious isolation,      ADVANCED DIRECTIVES:  Full Code, Primary Emergency Contact: [name],[name]     DISPOSITION:  Inpatient, Non-Monitored. Expected post-hospitalization disposition will be to home.    AUTHOR:  Miguel Dibble, MD  02/02/2023 at 4:24 PM    51 minutes were spent personally by me today on this encounter which include today's pre-visit review of the chart, obtaining appropriate history, performing an evaluation, documentation and discussion of management with details supported within the note for today's visit. The time documented was exclusive of any time spent on the separately billed procedure.

## 2023-02-03 NOTE — Consults
FINAL DISCHARGE MULTIDISCIPLINARY NOTE  Department of Care Coordination      Admit [id number]  Anticipated Date of Discharge: 02/03/2023    Following [id number], Luan Moore., MD    Home [address]  [place] [address]      DISCHARGE INFORMATION:     Discharge Address: [address]  [place] [address]    Individual(s) notified of discharge plan:  Contact Name: Karstens, Rikki Relationship: Self   Contact Number(s): [phone]      Is patient/family informed of discharge?: Yes Is patient/family agreeable of discharge destination?: Yes       Final Discharge Needs: No Needs     Homeless Discharge (if applicable):   Primary Living Situation: Lives w/Family          Lauraine Rinne,  02/03/2023

## 2023-02-03 NOTE — Consults
Received RN referral to provide diet education as pt with questions on what she should eat at home (plans to d/c today).     D/w pt low fiber and low fat diet, explained foods recommended and not recommended. Provided handouts and answered all questions. Encouraged pt to continue taking ONS.    RD will continue to follow per protocol if pt does not d/c.     Rockwell Alexandria, RD Pager [phone]  02/03/2023 12:42 PM

## 2023-02-04 ENCOUNTER — Telehealth: Payer: PRIVATE HEALTH INSURANCE

## 2023-02-04 MED FILL — VITAMIN D3 25 MCG (1000 UT) PO TABS: 25 mcg (1000 units) | ORAL | 30 days supply | Qty: 30 | Fill #0

## 2023-02-04 NOTE — Telephone Encounter
Discharge Followup Appointment Info      Date of Discharge: 02/03/2023    Discharge Disposition: Home or Self Care   TIme Frame: Within 7 Days    Sched with PCP?: Outside PCP PCP Name: Sandre Kitty, MD               Date and Time of appt: 02/10/23  2:45 PM PST    Was Appt Confirmed?: Pos    Method of Appt Confirmation: Confirmed via phone directly with patient/family    Barriers to Scheduling: None    Did you schedule any Specialty visits?: Yes Were there any scheduling issues?: No   Additional Appts/Tests/Info Gastro and Vascular Surgery-  Needs Prior Authorizations*  Pt is under [place] assigned  STAT referral request has been faxed to [facility]    No future appointments.            Is the patient being discharged to SNF? No  Clinic/Test/Procedure to be scheduled:PMD  Doctor Preferences (If applicable): Sandre Kitty, MD  Time Frame: Within 1 week  Reason for Appointment/Procedure: Follow up recent hospitalization for abdominal pain, diarrhea, s/p EGD  Clinic/Test/Procedure to be scheduled:GI  Doctor Preferences (If applicable): First available  Time Frame: Within 4 weeks  Reason for Appointment/Procedure: Follow up recent hospitalization for abdominal pain, diarrhea, s/p EGD, follow up for colonoscopy, could consider SIBO testing  Clinic/Test/Procedure to be scheduled:Vascular surgery  Doctor Preferences (If applicable): First available  Time Frame: Within 4 weeks  Reason for Appointment/Procedure: chronic mesenteric ischemia, consider surgical candidacy       Confirmed with patient at [phone]  Advised of referral process     [facility]

## 2023-02-25 IMAGING — MR MRI BRAIN WITH AND WITHOUT CONTRAST
16 of 19 series · 21 of 48 positions shown · IV contrast (PROHANCE)
Comparison: None

FINAL REPORT:
MRI without and with contrast
CLINICAL INDICATION: Facial paresthesias
TECHNIQUE: Multiplanar multisequence acquisition was performed without and with IV contrast.

[Series 2: survey_mpr_sag · sagittal · 1.6mm · 1.60mm/px · 1 of 5 slices shown]
[im 1/5]
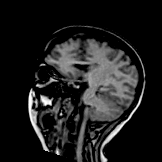

[Series 3: survey_mpr_cor · coronal · 1.6mm · 1.60mm/px · 1 of 3 slices shown]
[im 1/3]
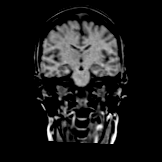

[Series 4: survey_mpr_tra · axial · 1.6mm · 1.60mm/px · 1 of 3 slices shown]
[im 1/3]
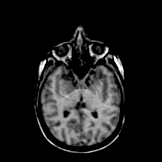

[Series 5: T1 · sagittal · 4.0mm · 0.72mm/px · 1 of 27 slices shown (1 of 2)]
[im 1/27]
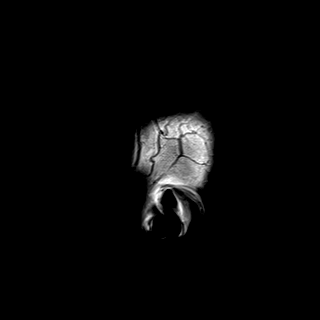

[Series 6: ep2d_diff_(id)_trace_p2_tracew · axial · 4.0mm · 0.60mm/px · 1 of 32 slices shown]
[im 1/32]
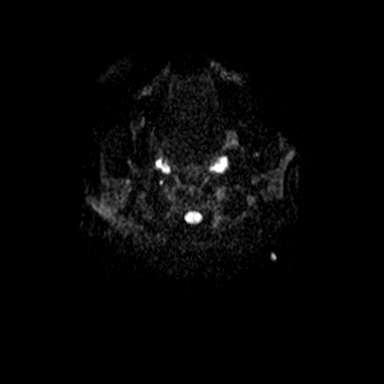

[Series 7: ep2d_diff_(id)_trace_p2_adc · axial · 4.0mm · 0.60mm/px · 1 of 32 slices shown]
[im 1/32]
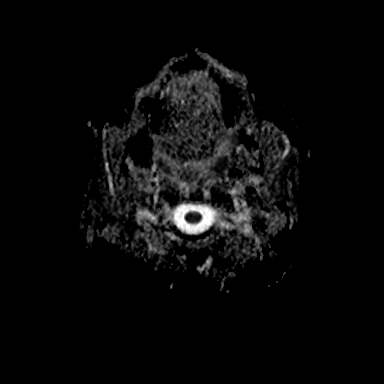

[Series 8: ep2d_diff_(id)_trace_p2_exp · axial · 4.0mm · 0.60mm/px · 1 of 32 slices shown]
[im 1/32]
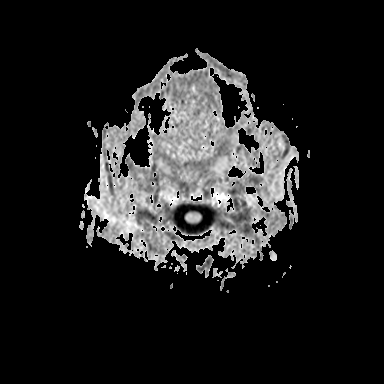

[Series 9: T2 · axial · 4.0mm · 0.51mm/px · 1 of 32 slices shown]
[im 1/32]
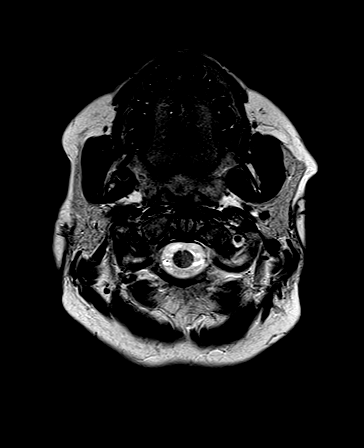

[Series 10: T1 · axial · 4.0mm · 0.72mm/px · z∈[-83,+65]mm · 2 of 32 slices shown (2 of 2)]
[im 1/32]
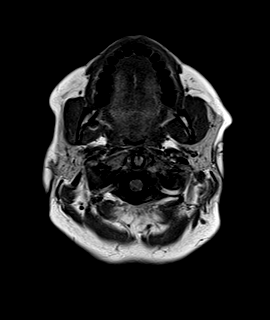
[im 32/32]
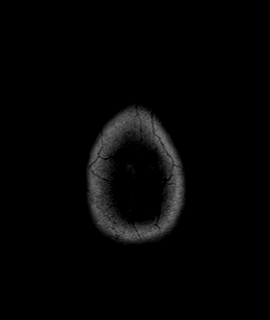

[Series 14: GRE · axial · 4.0mm · 0.45mm/px · z∈[-83,+65]mm · 2 of 32 slices shown]
[im 1/32]
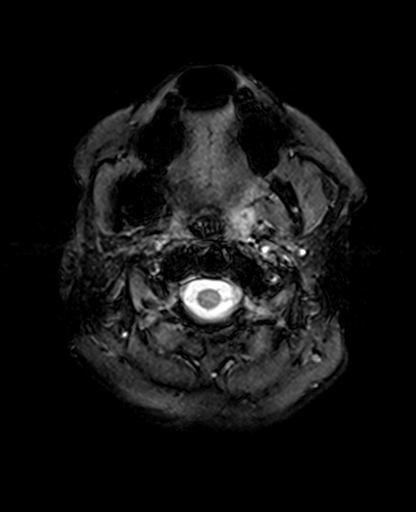
[im 32/32]
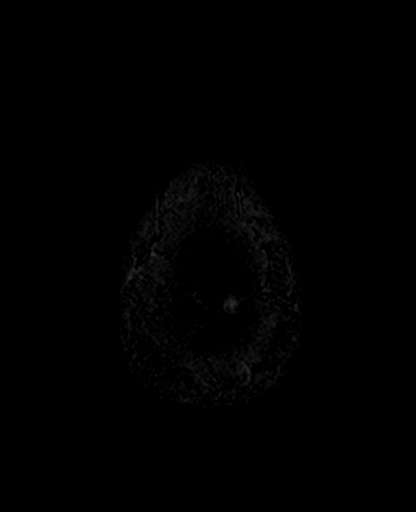

[Series 15: T1 post-contrast · axial · 4.0mm · 0.72mm/px · z∈[-83,+65]mm · 2 of 32 slices shown (1 of 5)]
[im 1/32]
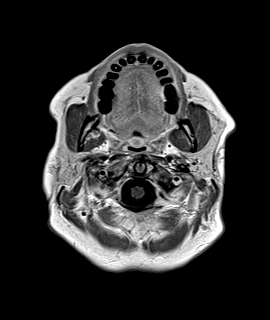
[im 32/32]
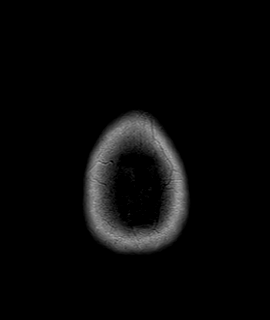

[Series 16: T1 post-contrast · sagittal · 4.0mm · 0.72mm/px · 1 of 27 slices shown (2 of 5)]
[im 1/27]
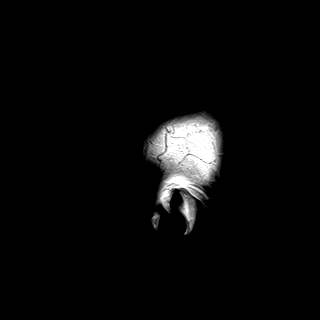

[Series 17: T1 post-contrast · coronal · 4.0mm · 0.72mm/px · 2 of 34 slices shown (3 of 5)]
[im 1/34]
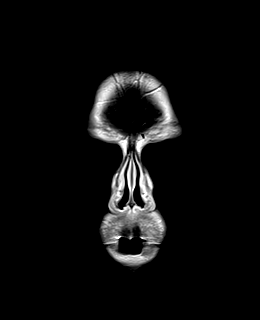
[im 34/34]
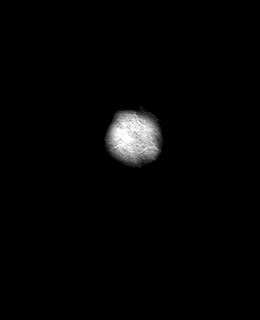

[Series 18: T1 post-contrast · sagittal · 3.0mm · 0.69mm/px · 1 of 15 slices shown (4 of 5)]
[im 1/15]
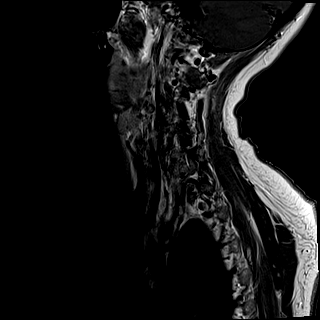

[Series 19: T1 post-contrast · axial · 3.0mm · 0.35mm/px · z∈[-218,-105]mm · 2 of 37 slices shown (5 of 5)]
[im 1/37]
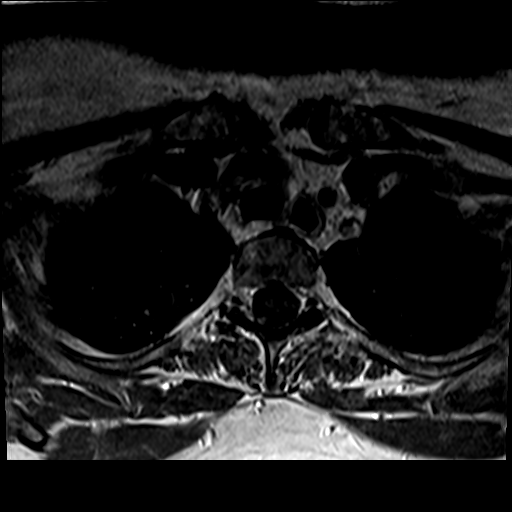
[im 37/37]
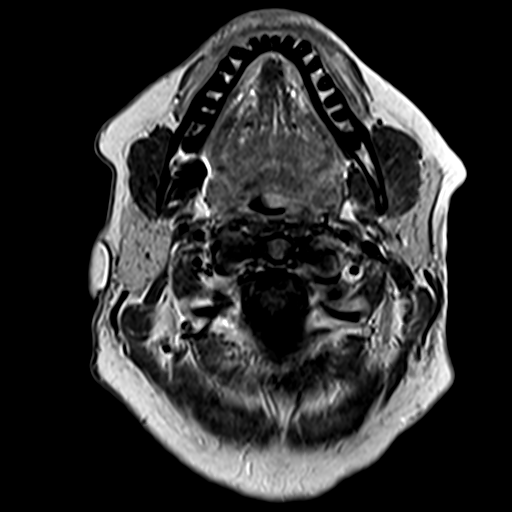

[Series 20: T1 fat-sat post-contrast · sagittal · 3.0mm · 0.43mm/px · 1 of 15 slices shown]
[im 1/15]
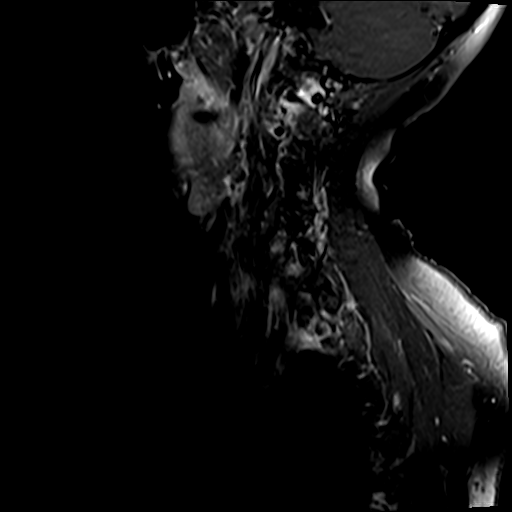

[21 of 48 positions shown; findings below may reference images not displayed]

FINDINGS: Diffusion weighted images appear within normal limits. Noncontrast sagittal images show normal appearing corpus callosum, optic chiasm, and pituitary gland. No Chiari malformation is seen. Noncontrast axial images show normal appearing ventricles and sulci. No cerebral gray or white matter signal abnormality is seen. No cerebral edema or mass effect is appreciated.  The cerebellum and brainstem demonstrate normal architecture and signal intensity. There is no intra-axial or extra-axial fluid collection appreciated. The vascular flow-voids appear within normal limits.
Postcontrast images show no AV malformation or enhancing mass.
IMPRESSION: Unremarkable MRI of the brain without and with contrast.
Is the patient pregnant?
No

## 2023-02-25 IMAGING — MR MRI THORACIC SPINE WITH AND  WITHOUT CONTRAST
10 of 14 series · 23 of 48 positions shown · IV contrast (agent unspecified)
Comparison: None

FINAL REPORT:
MRI thoracic spine:
CLINICAL INDICATION: Upper and lower extremity paresthesias
TECHNIQUE: Multiplanar multisequence acquisition was performed without and with IV contrast.

[Series 16: T2 · sagittal · 4.0mm · 0.76mm/px · 2 of 15 slices shown]
[im 1/15]
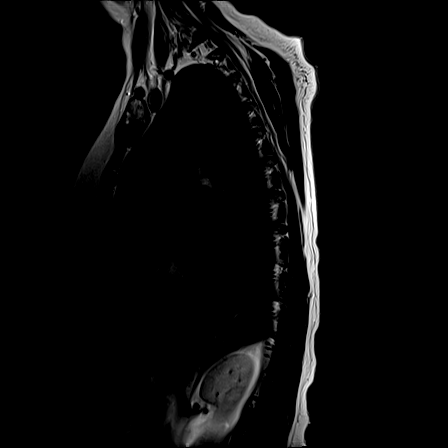
[im 15/15]
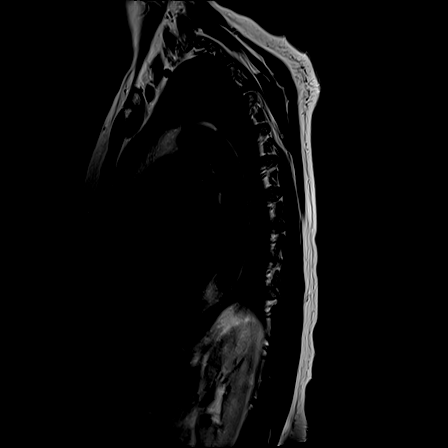

[Series 17: T1 · sagittal · 4.0mm · 0.89mm/px · 2 of 15 slices shown (1 of 3)]
[im 1/15]
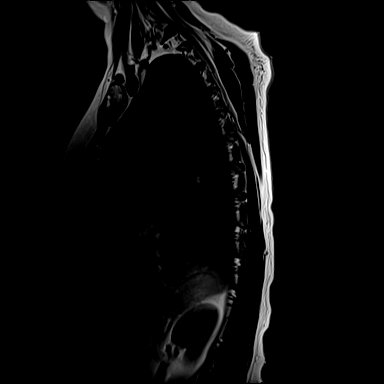
[im 15/15]
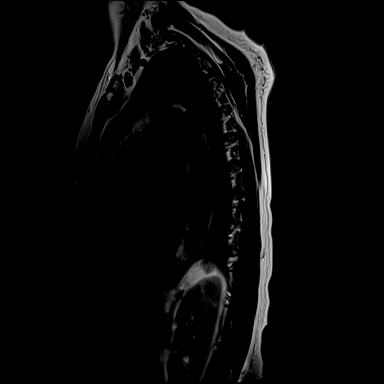

[Series 18: STIR · sagittal · 4.0mm · 0.89mm/px · 2 of 15 slices shown]
[im 1/15]
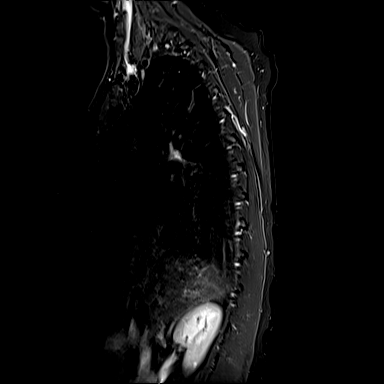
[im 15/15]
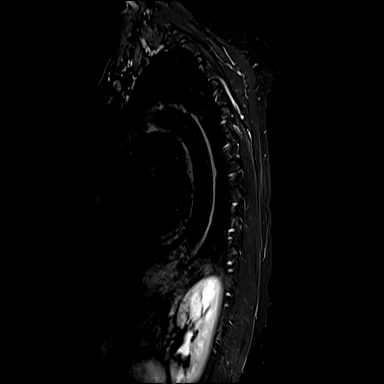

[Series 19: GRE · axial · 7.0mm · 0.39mm/px · 1 of 25 slices shown]
[im 1/25]
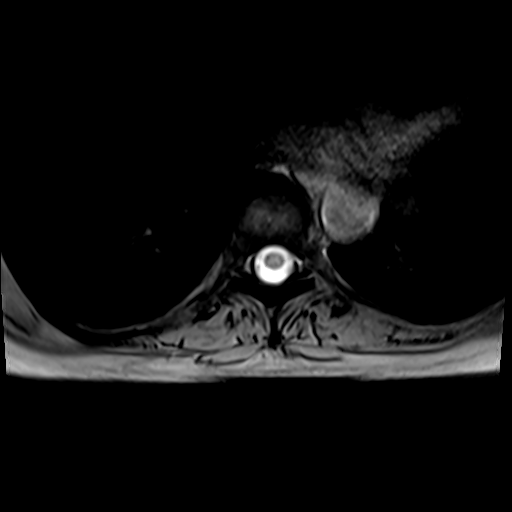

[Series 21: T1 · axial · non-contrast · 7.0mm · 0.39mm/px · z∈[-323,-148]mm · 3 of 25 slices shown (2 of 3)]
[im 1/25]
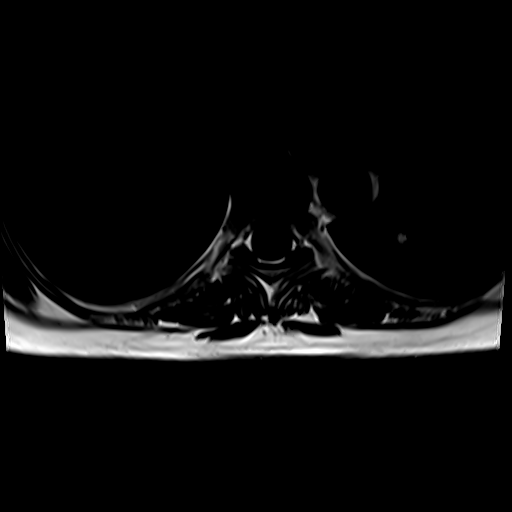
[im 13/25]
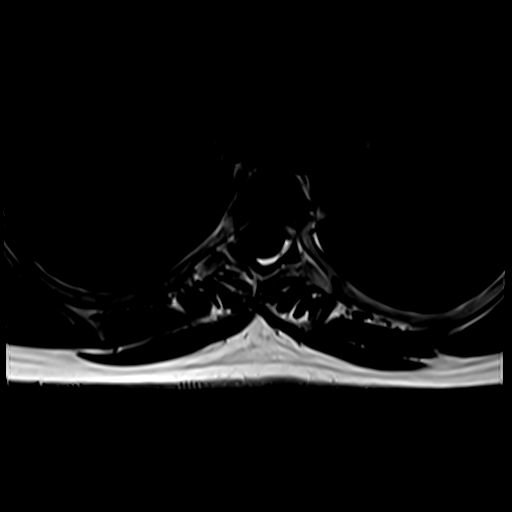
[im 25/25]
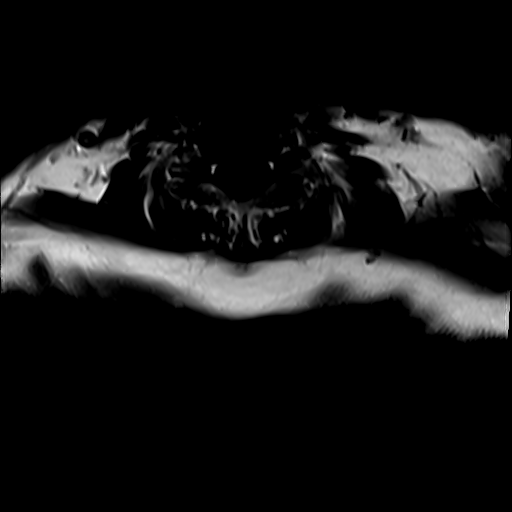

[Series 22: T1 · axial · non-contrast · 7.0mm · 0.39mm/px · z∈[-446,-268]mm · 3 of 25 slices shown (3 of 3)]
[im 1/25]
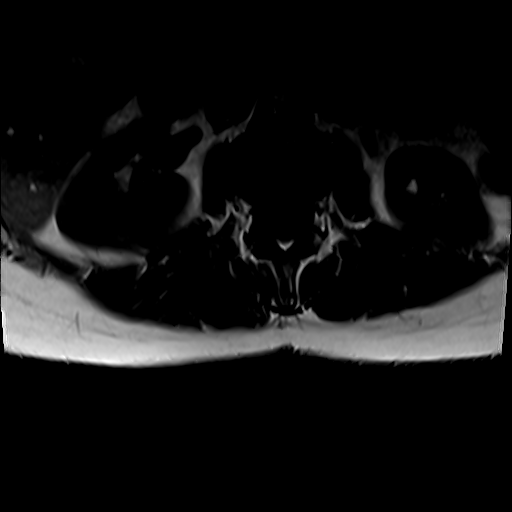
[im 13/25]
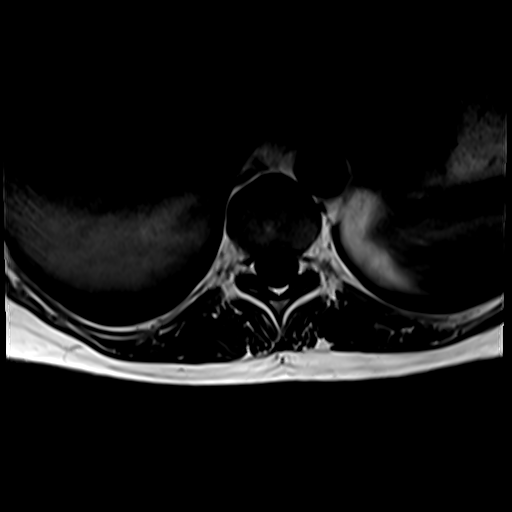
[im 25/25]
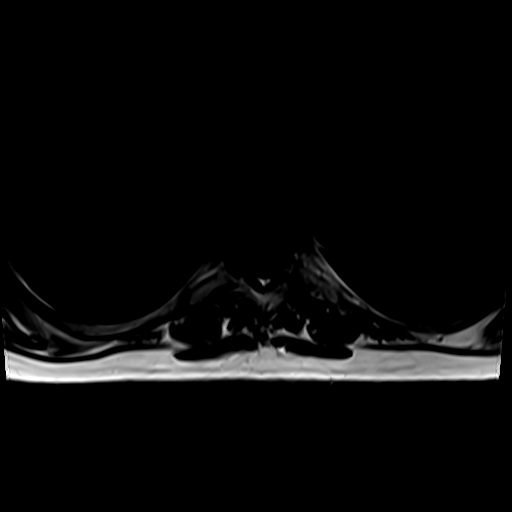

[Series 23: T1 post-contrast · axial · 7.0mm · 0.39mm/px · z∈[-323,-148]mm · 3 of 25 slices shown (1 of 3)]
[im 1/25]
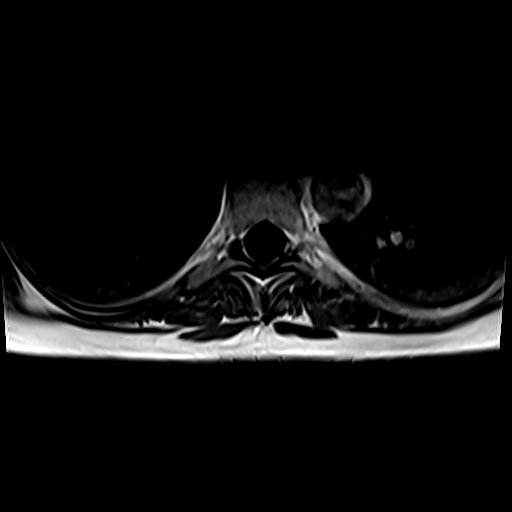
[im 13/25]
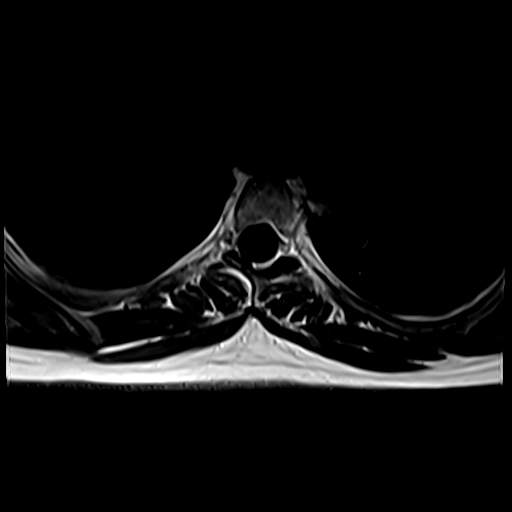
[im 25/25]
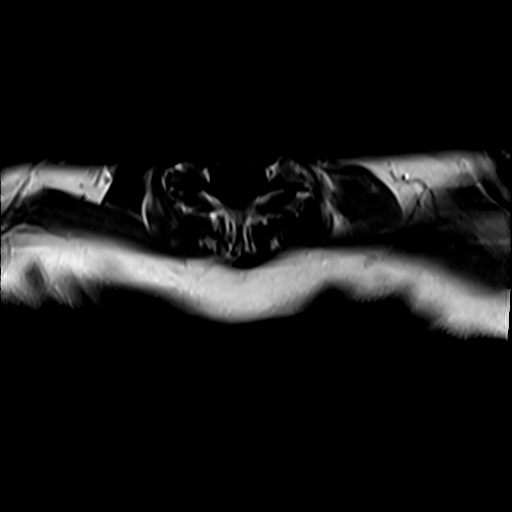

[Series 24: T1 post-contrast · axial · 7.0mm · 0.39mm/px · z∈[-446,-268]mm · 3 of 25 slices shown (2 of 3)]
[im 1/25]
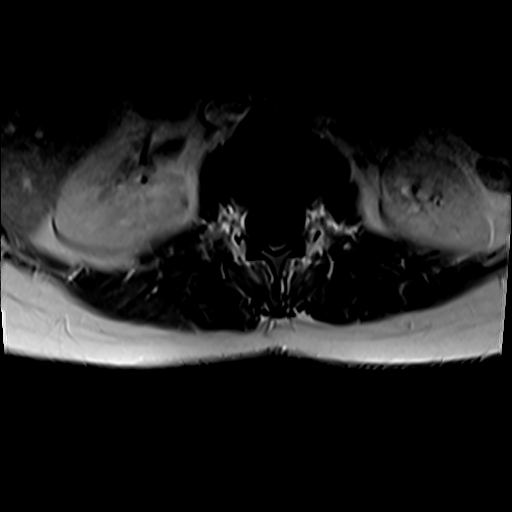
[im 13/25]
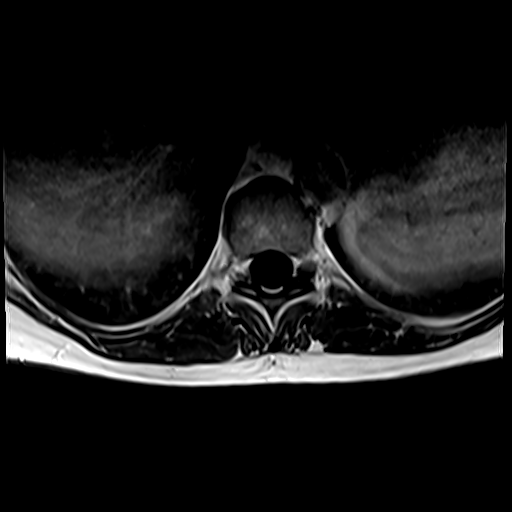
[im 25/25]
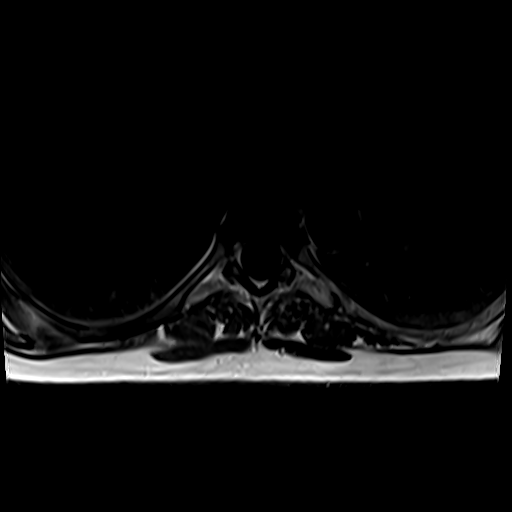

[Series 25: T1 post-contrast · sagittal · 4.0mm · 0.74mm/px · 2 of 15 slices shown (3 of 3)]
[im 1/15]
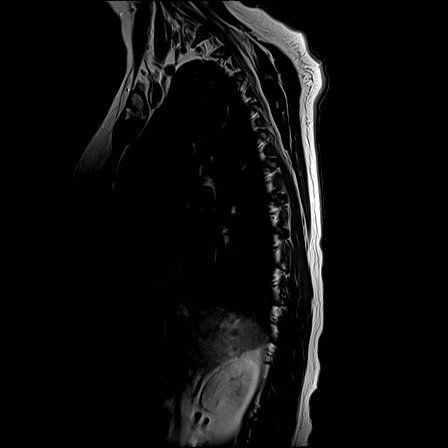
[im 15/15]
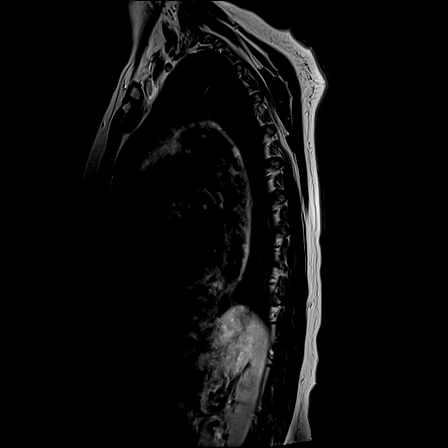

[Series 26: T1 fat-sat post-contrast · sagittal · 4.0mm · 1.06mm/px · 2 of 15 slices shown]
[im 1/15]
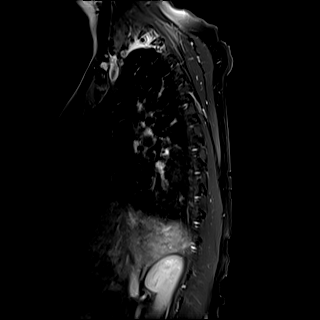
[im 15/15]
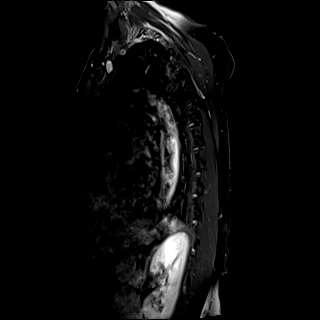

[23 of 48 positions shown; findings below may reference images not displayed]

FINDINGS: Noncontrast sagittal images show no fracture or spondylolisthesis. There is mild multilevel thoracic disc degenerative disease. There is a Schmorl's node in a lower thoracic vertebral body.
Noncontrast axial additional no evidence of disc protrusion. No spinal or foraminal stenosis is appreciated. There is no spinal cord signal abnormality. The surrounding soft tissues appear unremarkable.
Postcontrast images show no enhancing abnormality in the thoracic vertebra, spinal canal, or surrounding soft tissues.
IMPRESSION: 1.  Mild multilevel disc degenerative disease.
2.  No spinal or foraminal stenosis.
3.  No spinal cord abnormality.
Is the patient pregnant?
No

## 2023-02-25 IMAGING — MR MRI CERVICAL SPINE WITH AND WITHOUT CONTRAST
8 series · 48 of 48 positions shown · IV contrast (agent unspecified)
Comparison: None

FINAL REPORT:
MRI cervical spine:
CLINICAL INDICATION: Upper and lower extremity paresthesias
TECHNIQUE: Multiplanar multisequence acquisition was performed without and with IV contrast.

[Series 3: survey_mpr_sag · sagittal · 1.7mm · 1.67mm/px · 4 of 15 slices shown]
[im 1/15]
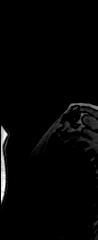
[im 5/15]
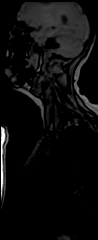
[im 10/15]
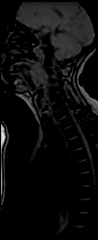
[im 15/15]
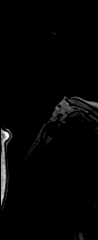

[Series 4: survey_mpr_(person_name) · axial · 1.7mm · 1.67mm/px · z∈[-308,-8]mm · 2 of 7 slices shown]
[im 1/7]
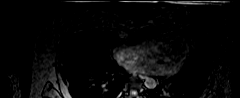
[im 7/7]
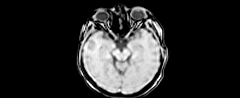

[Series 5: T2 · sagittal · 3.0mm · 0.69mm/px · 4 of 15 slices shown (1 of 2)]
[im 1/15]
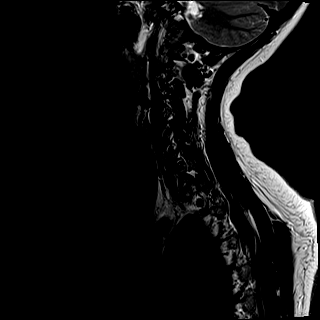
[im 5/15]
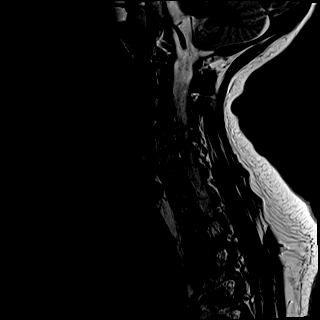
[im 10/15]
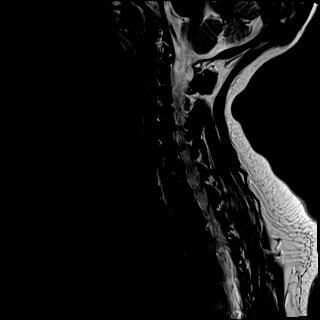
[im 15/15]
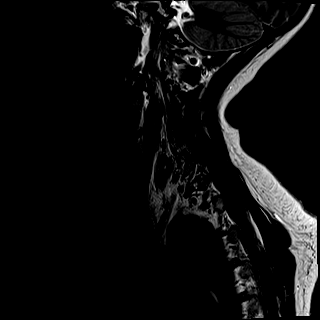

[Series 6: T1 · sagittal · 3.0mm · 0.62mm/px · 4 of 15 slices shown (1 of 2)]
[im 1/15]
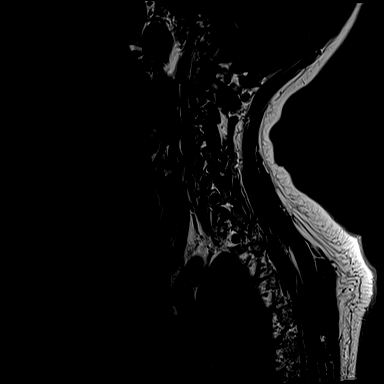
[im 5/15]
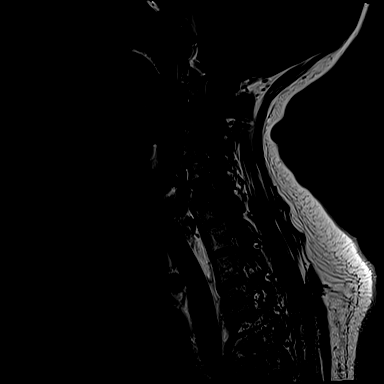
[im 10/15]
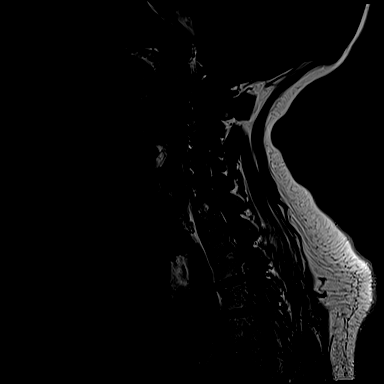
[im 15/15]
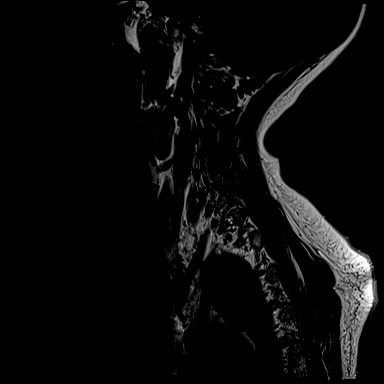

[Series 7: STIR · sagittal · 3.0mm · 1.15mm/px · 4 of 15 slices shown]
[im 1/15]
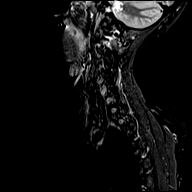
[im 5/15]
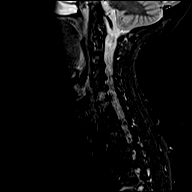
[im 10/15]
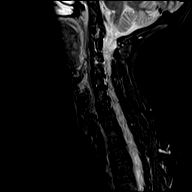
[im 15/15]
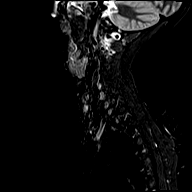

[Series 8: GRE · axial · 3.0mm · 0.94mm/px · z∈[-195,-111]mm · 7 of 28 slices shown]
[im 1/28]
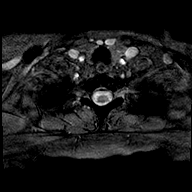
[im 5/28]
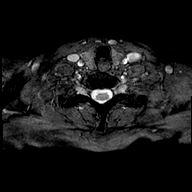
[im 10/28]
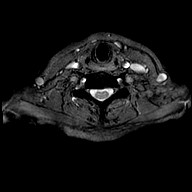
[im 14/28]
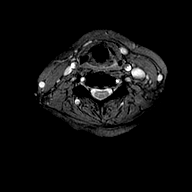
[im 19/28]
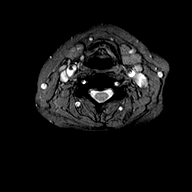
[im 23/28]
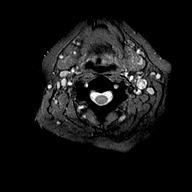
[im 28/28]
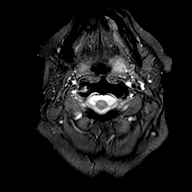

[Series 9: T2 · axial · 2.0mm · 0.70mm/px · z∈[-197,-109]mm · 13 of 48 slices shown (2 of 2)]
[im 1/48]
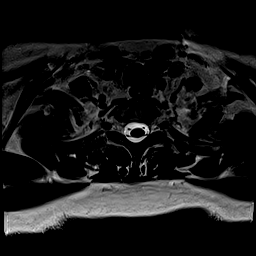
[im 4/48]
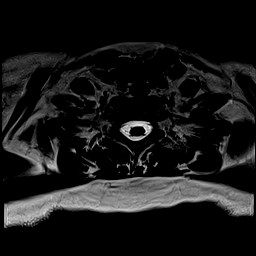
[im 8/48]
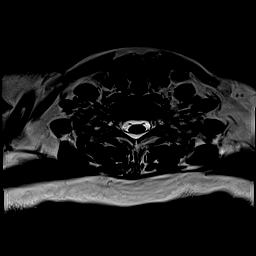
[im 12/48]
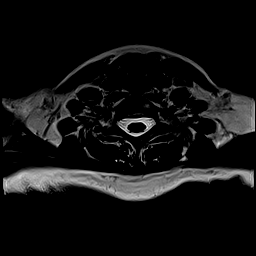
[im 16/48]
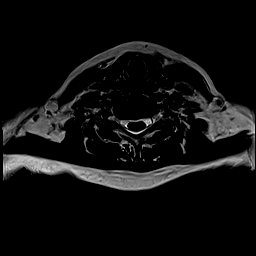
[im 20/48]
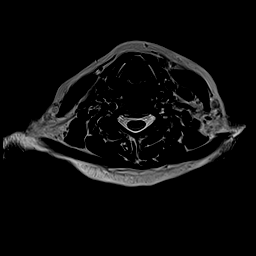
[im 24/48]
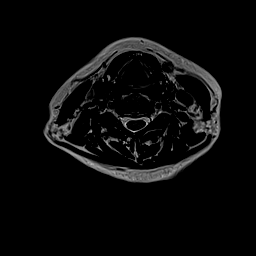
[im 28/48]
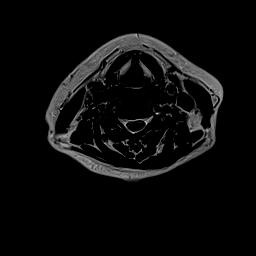
[im 32/48]
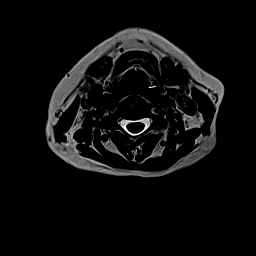
[im 36/48]
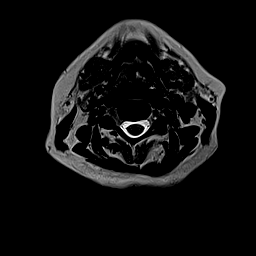
[im 40/48]
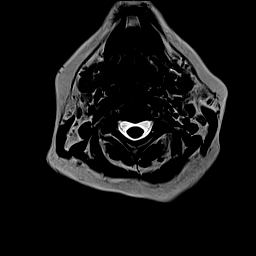
[im 44/48]
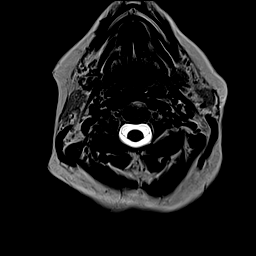
[im 48/48]
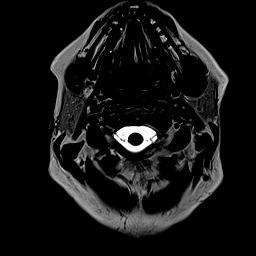

[Series 10: T1 · axial · non-contrast · 3.0mm · 0.47mm/px · z∈[-218,-105]mm · 10 of 37 slices shown (2 of 2)]
[im 1/37]
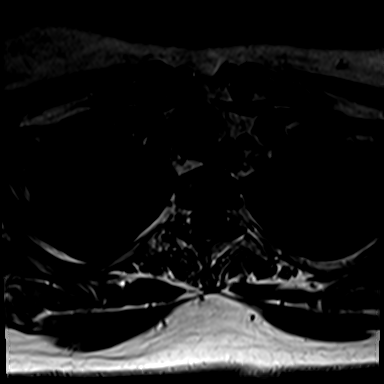
[im 5/37]
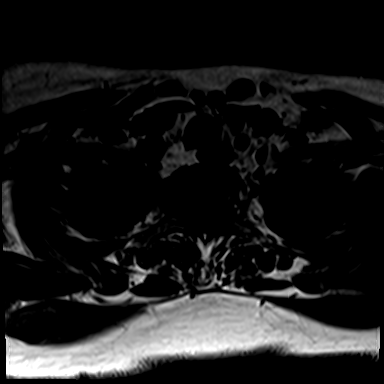
[im 9/37]
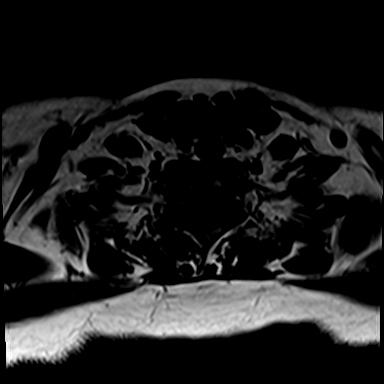
[im 13/37]
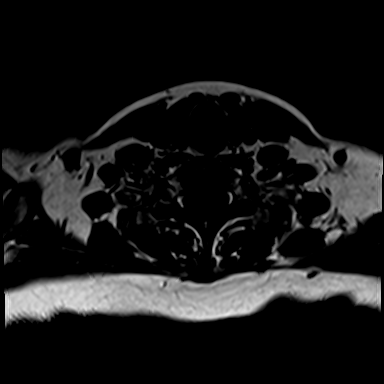
[im 17/37]
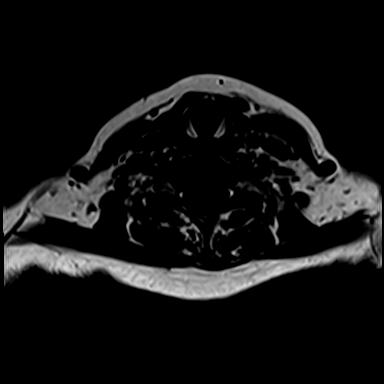
[im 21/37]
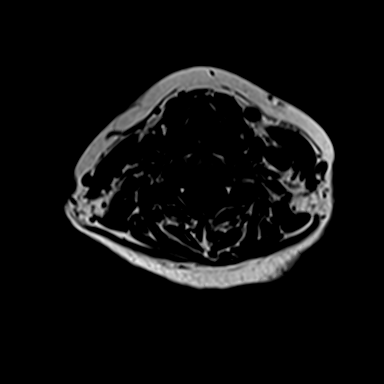
[im 25/37]
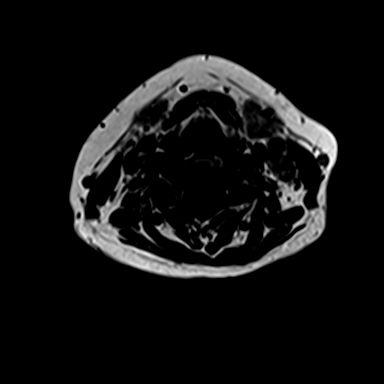
[im 29/37]
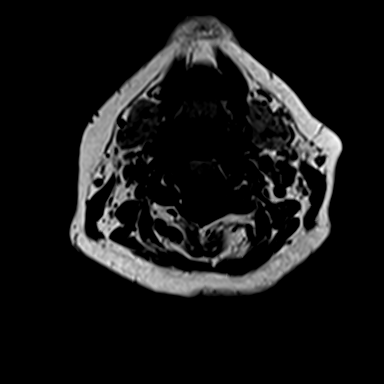
[im 33/37]
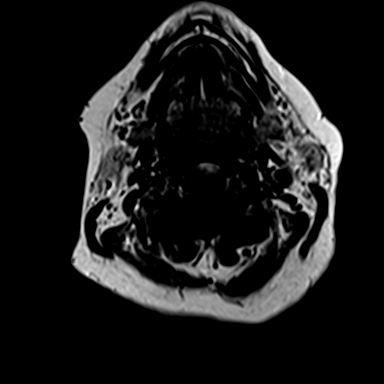
[im 37/37]
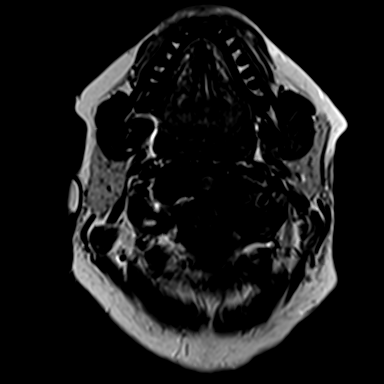

[48 of 48 positions shown; findings below may reference images not displayed]

FINDINGS: Noncontrast sagittal images show no fracture or spondylolisthesis. There is multilevel disc degenerative disease. No marrow signal normality is seen. There is no Chiari malformation.
Noncontrast axial images show disc osteophyte complexes at multiple levels. There is mild C4-5 through C6-7 spinal stenosis. There is mild to moderate right C4-5 foraminal stenosis. No spinal cord signal abnormality is seen. The surrounding soft tissues appear unremarkable.
Postcontrast images show no enhancing abnormality in the cervical vertebra, spinal canal, or surrounding soft tissues.
IMPRESSION: Degenerative disease with spinal and foraminal stenosis as described.
No spinal cord abnormality.
Is the patient pregnant?
No

## 2023-02-26 ENCOUNTER — Telehealth: Payer: PRIVATE HEALTH INSURANCE

## 2023-02-26 NOTE — Telephone Encounter
Update;    Received Authorization [id number]    Future Appointments  04/26/2023   8:30 AM    Rigberg, Rayvon Char., MD      [facility]  (scheduled for 1st available) msg routed to MD's assistant for accommodations     Confirmed with pt at [phone]    Thanks,  Rod Holler

## 2023-03-01 ENCOUNTER — Ambulatory Visit: Payer: PRIVATE HEALTH INSURANCE

## 2023-03-29 ENCOUNTER — Ambulatory Visit: Payer: PRIVATE HEALTH INSURANCE | Attending: Vascular Surgery

## 2023-03-29 DIAGNOSIS — R1033 Periumbilical pain: Secondary | ICD-10-CM

## 2023-03-29 NOTE — Patient Instructions
We will be calling you to set up the angiogram we discussed  Let me know if any issues!

## 2023-03-29 NOTE — Progress Notes
Here for F/U of her post prandial pain and weight loss (50 pounds since July). Gets pain, nausea and vomiting every time that she eats. She has definitely developed food fear and avoids eating because of the symptoms. Her sx complicated by a h/u perforated ulcer and a B2 reconstruction, as well as a h/o GERD. She has seen gastroenterology. 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 Celiac is wide open. GFR is 52.     Gastroenterology wants to redop her colonoscopy, but they do not think that her sx are related to her colon. We reviewed the procedure for an angiogram and possible SMA stenting. She would like to go ahead. Will set this up for you in the near future.

## 2023-04-02 IMAGING — MR MRI BREAST BILATERAL WITHOUT CONTRAST
7 of 8 series · 30 of 48 positions shown · non-contrast
Comparison: Mammogram 11/29/2021

This is a summary report. The complete report is available in the patient's medical record. If you cannot access the medical record, please contact the sending organization for a detailed fax or copy.
PAIN IN UPPER LEFT BREAST/AXILLA FOR YEARS AND GETTING WORSE.  EXAM ORDERED WITHOUT ONLY. PATIENT REFUSES MAMMO DUE TO "HX OF LYMPHEDEMA AND PAIN ASSOCIATED WITH TECHNIQUE OF MAMMO" Patients sister had breast cancer.
FINAL REPORT:
MRI BREAST BILATERAL WITHOUT CONTRAST
INDICATION: Pain in the left upper breast/axilla for years.
TECHNIQUE: Multiplanar multisequence MR imaging of the breasts was performed without contrast. DynaCAD was utilized.

[Series 2: survey · axial · 6.0mm · 0.98mm/px · 1 of 12 slices shown]
[im 1/12]
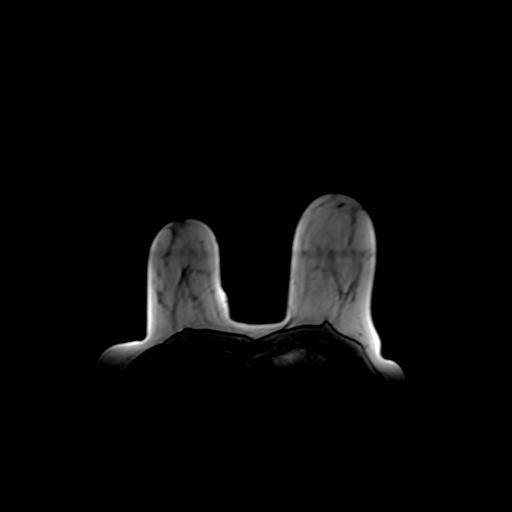

[Series 4: STIR · axial · 4.0mm · 0.75mm/px · z∈[-104,+66]mm · 3 of 35 slices shown]
[im 1/35]
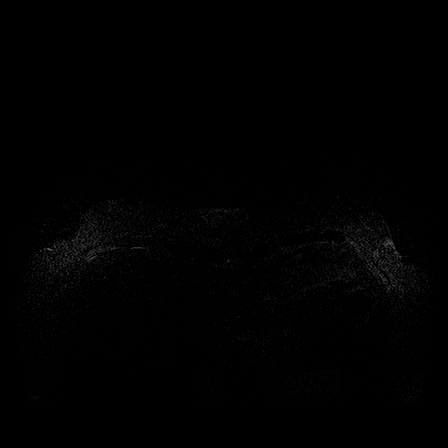
[im 18/35]
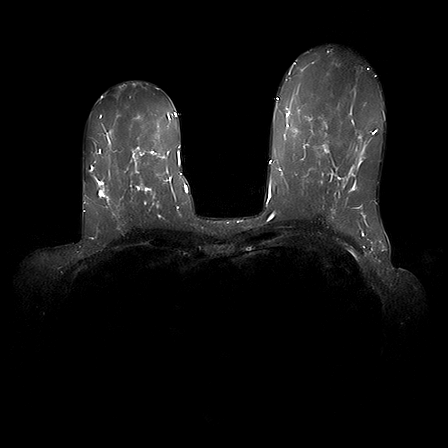
[im 35/35]
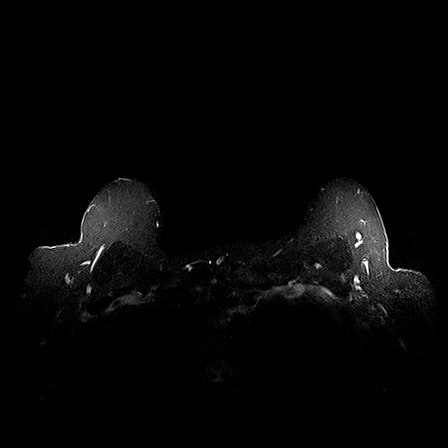

[Series 5: ax dwi_tracew_dfc_mix · axial · 4.0mm · 1.67mm/px · z∈[-109,+81]mm · 11 of 116 slices shown (1 of 4)]
[im 1/116]
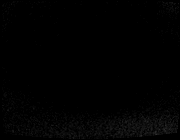
[im 12/116]
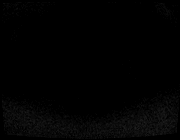
[im 24/116]
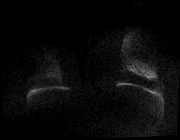
[im 35/116]
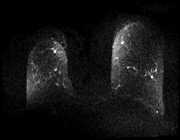
[im 47/116]
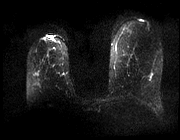
[im 58/116]
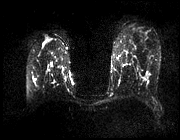
[im 70/116]
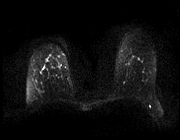
[im 81/116]
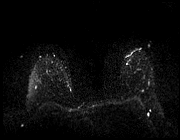
[im 93/116]
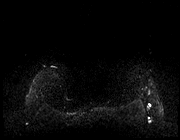
[im 104/116]
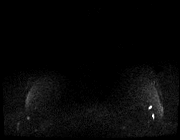
[im 116/116]
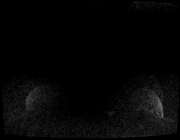

[Series 5: ax dwi_tracew_dfc_mix · axial · 4.0mm · 1.67mm/px · z∈[-109,+81]mm · 4 of 39 slices shown (2 of 4)]
[im 1/39]
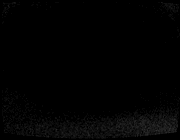
[im 13/39]
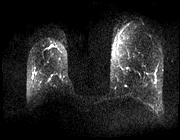
[im 26/39]
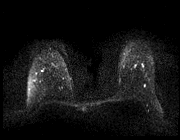
[im 39/39]
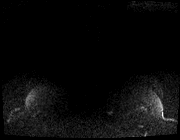

[Series 5: ax dwi_tracew_dfc_mix · axial · 4.0mm · 1.67mm/px · z∈[-109,+81]mm · 4 of 38 slices shown (3 of 4)]
[im 1/38]
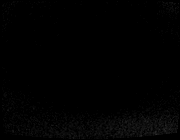
[im 13/38]
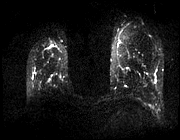
[im 25/38]
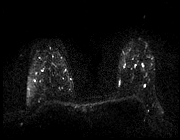
[im 38/38]
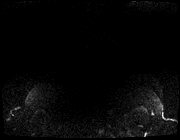

[Series 5: ax dwi_tracew_dfc_mix · axial · 4.0mm · 1.67mm/px · z∈[-109,+81]mm · 4 of 39 slices shown (4 of 4)]
[im 1/39]
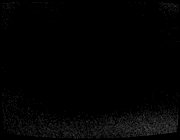
[im 13/39]
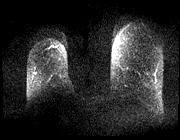
[im 26/39]
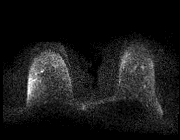
[im 39/39]
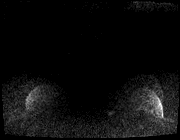

[Series 6: ax dwi_adc_dfc_mix · axial · 4.0mm · 1.67mm/px · z∈[-79,+81]mm · 3 of 32 slices shown]
[im 1/32]
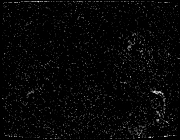
[im 16/32]
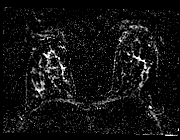
[im 32/32]
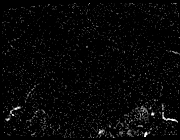

[30 of 48 positions shown; findings below may reference images not displayed]

FINDINGS: Evaluation is limited due to lack of intravenous contrast. Background parenchymal enhancement cannot be assessed due to lack of contrast.
Right breast: The right breast is asymmetrically smaller than the left. No suspicious mass. No skin thickening or nipple retraction. No suspicious lymphadenopathy.
Left breast: No suspicious mass. No skin thickening or nipple retraction. No suspicious lymphadenopathy.
Additional observations: No suspicious liver or marrow lesions.
IMPRESSION: 
IMPRESSION: 1. Limited study due to lack of intravenous contrast.
2. No MR evidence of malignancy.
BI-RADS 1, negative. Recommend routine annual mammography unless otherwise clinically indicated.
One must recognize that there is as high as 10% false negative rate on a single screening mammogram. Current recommendations are for a baseline screening mammogram between ages 35-40 and annual mammography after 40.
The patient will be entered into a reminder system with a target due date for the next mammogram.
Is the patient pregnant?
No

## 2023-04-06 IMAGING — US US ABDOMEN RUQ
1 series · 13 of 25 positions shown · non-contrast
Comparison: None

FINAL REPORT:
EXAM: US ABDOMEN RUQ
HISTORY: Elevated liver enzymes
TECHNIQUE: Grayscale, color Doppler, and spectral Doppler sonography was performed of the abdomen.

[Series 1: us abdomen ruq · 13 of 107 slices shown]
[im 1/107]
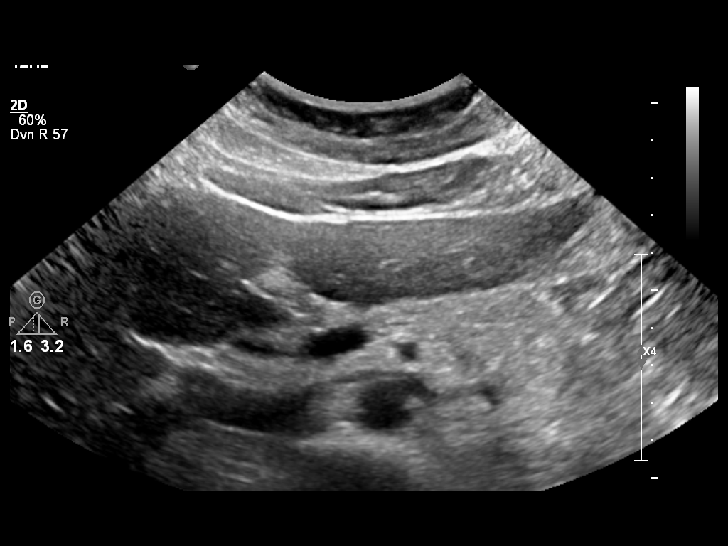
[im 9/107]
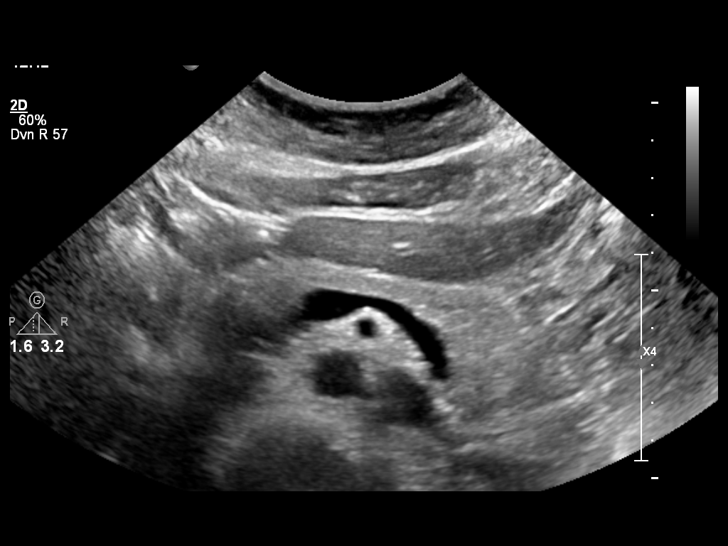
[im 18/107]
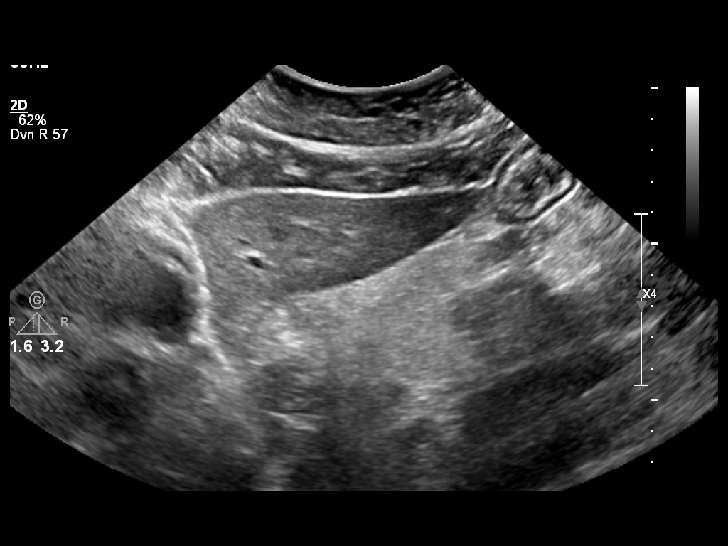
[im 27/107]
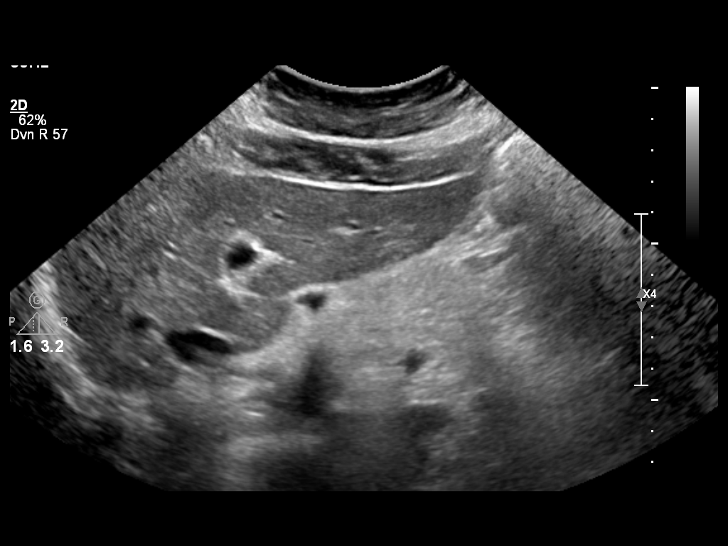
[im 36/107]
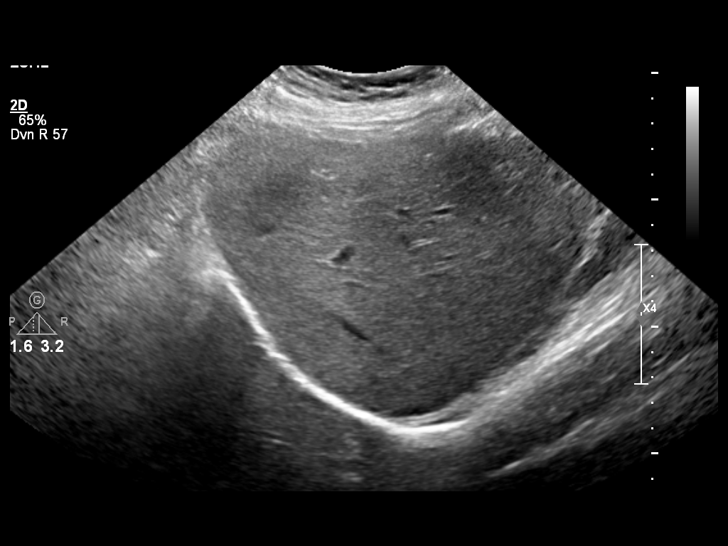
[im 45/107]
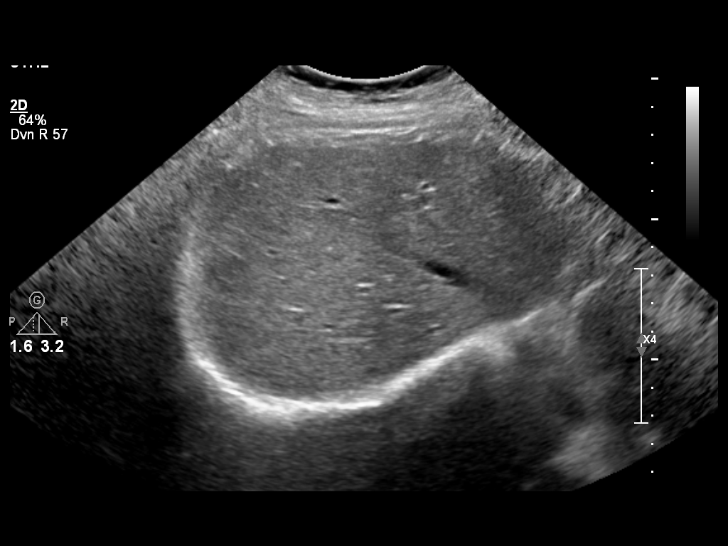
[im 54/107]
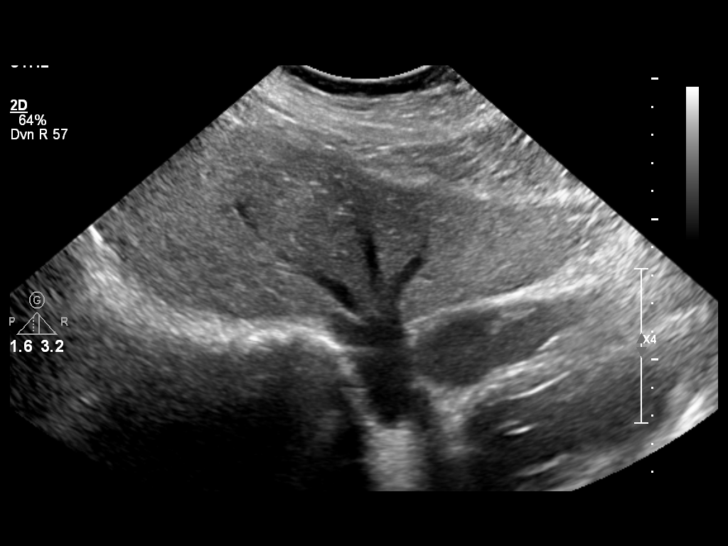
[im 62/107]
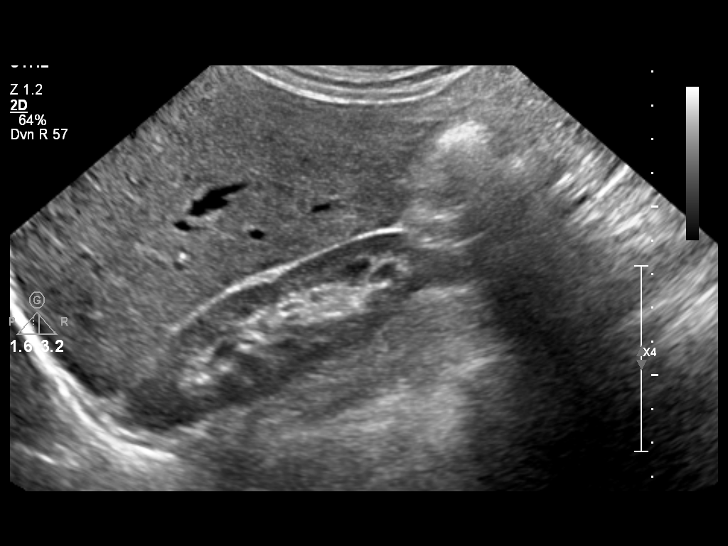
[im 71/107]
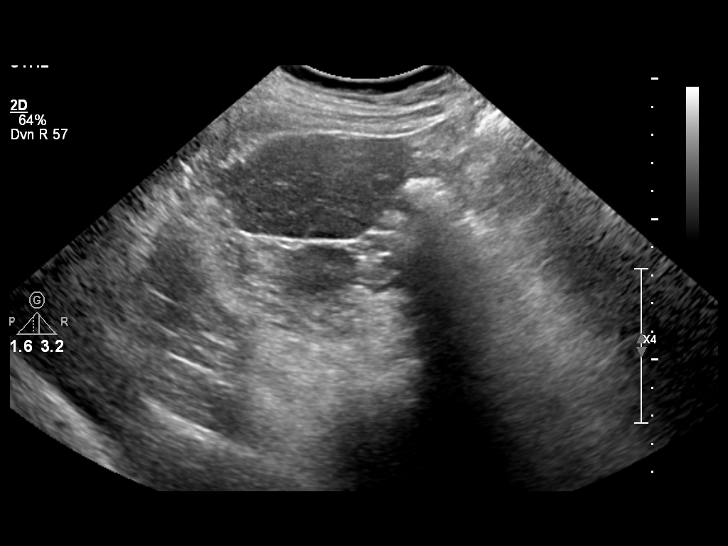
[im 80/107]
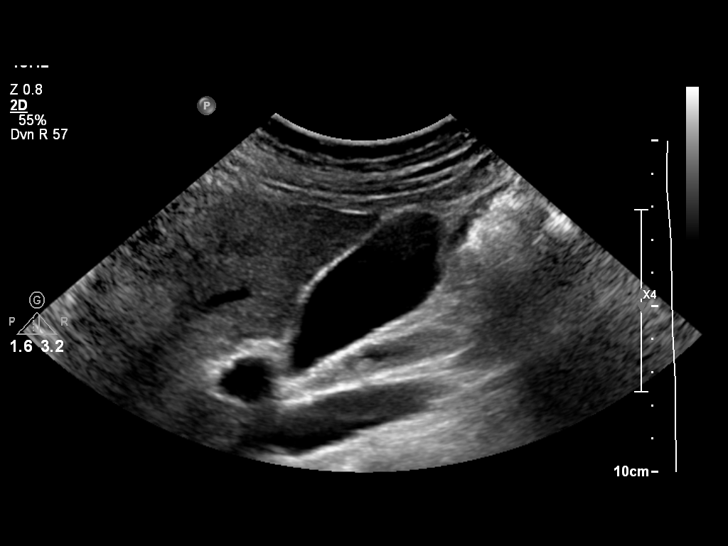
[im 89/107]
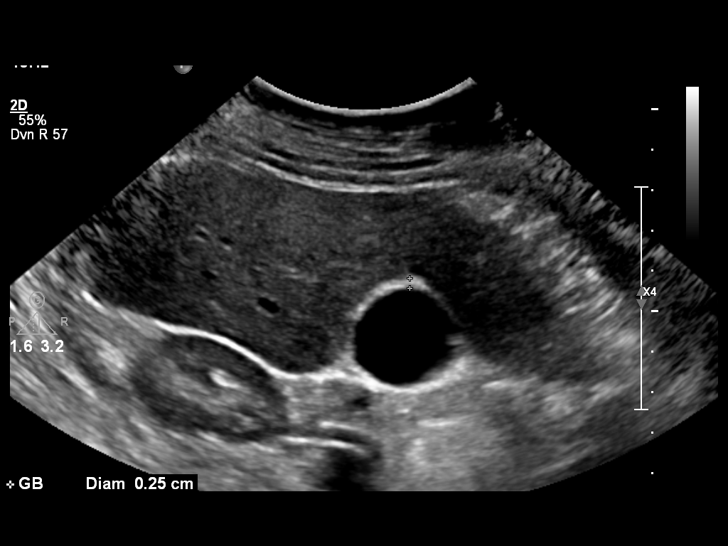
[im 98/107]
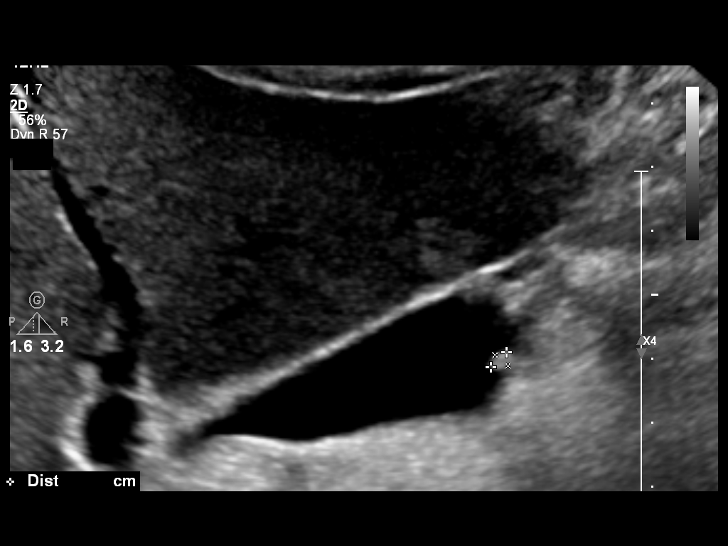
[im 107/107]
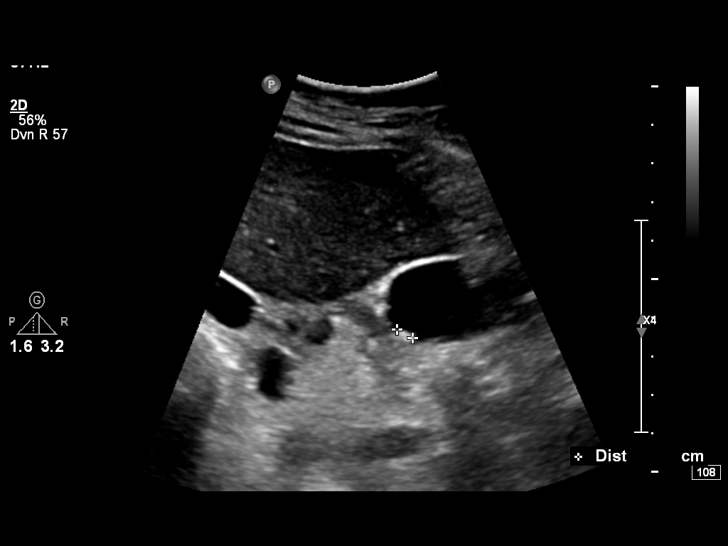

[13 of 25 positions shown; findings below may reference images not displayed]

FINDINGS: The liver is normal in size with homogeneous echotexture and normal echogenicity. No focal hepatic lesions are identified. The portal vein is patent, and demonstrates hepatopedal flow. There is no evidence for intrahepatic or extrahepatic biliary ductal dilatation.
There is a 0.4 cm echogenic focus adherent to the gallbladder wall, which may represent gallbladder wall polyp versus adherent sludge. The gallbladder wall measures 3 mm, and there is a negative sonographic [name] sign. The common bile duct measures 7 mm in transverse diameter.
The pancreas is Not adequately evaluated secondary to overlying bowel gas.
The right kidney measures 11.4 x 3.1 x 3.9 cm. There is no hydronephrosis, nephrolithiasis or focal mass.
IMPRESSION: Unremarkable sonographic appearance of the liver.
There is a 0.4 cm echogenic focus adherent to the gallbladder wall, which may represent gallbladder wall polyp versus adherent sludge. Can be further evaluated with ultrasound in 6 months. No sonographic evidence for acute cholecystitis

## 2023-04-08 ENCOUNTER — Ambulatory Visit: Payer: PRIVATE HEALTH INSURANCE

## 2023-04-08 ENCOUNTER — Inpatient Hospital Stay
Admit: 2023-04-08 | Discharge: 2023-04-12 | Disposition: A | Discharge status: Home / Self Care | Payer: PRIVATE HEALTH INSURANCE | Source: Home / Self Care | Attending: Student in an Organized Health Care Education/Training Program

## 2023-04-08 DIAGNOSIS — K551 Chronic vascular disorders of intestine: Secondary | ICD-10-CM

## 2023-04-08 DIAGNOSIS — R1013 Epigastric pain: Secondary | ICD-10-CM

## 2023-04-08 LAB — CBC: HEMOGLOBIN: 10.6 g/dL — ABNORMAL LOW (ref 11.6–15.2)

## 2023-04-08 LAB — Unfractionated Heparin, by anti-Xa: HEPARIN UNFRACTIONATED: <0.1 [IU]/mL

## 2023-04-08 LAB — Comprehensive Metabolic Panel
CALCIUM: 9.7 mg/dL (ref 8.6–10.4)
CHLORIDE: 105 mmol/L (ref 96–106)

## 2023-04-08 LAB — Phosphorus: PHOSPHORUS: 4 mg/dL (ref 2.3–4.4)

## 2023-04-08 LAB — Lipase: LIPASE: 34 U/L (ref 13–69)

## 2023-04-08 LAB — UA,Dipstick

## 2023-04-08 LAB — Magnesium: MAGNESIUM: 1.4 meq/L (ref 1.4–1.9)

## 2023-04-08 LAB — Blood Lactate
BLOOD LACTATE: 18 mg/dL (ref 5–18)
BLOOD LACTATE: 8 mg/dL (ref 5–18)

## 2023-04-08 LAB — Prothrombin Time Panel: PROTHROMBIN TIME: 13 s (ref 11.5–14.4)

## 2023-04-08 LAB — UA,Microscopic: RBCS HPF: 0 {cells}/[HPF] (ref 0–2)

## 2023-04-08 LAB — APTT: APTT: 30.8 s (ref 24.4–36.2)

## 2023-04-08 LAB — Differential Automated: LYMPHOCYTE PERCENT, AUTO: 30.7 (ref 0.20–0.80)

## 2023-04-08 MED ADMIN — SODIUM CHLORIDE 0.9 % IV BOLUS: 1000 mL | INTRAVENOUS | @ 16:00:00 | Stop: 2023-04-08 | NDC 00338004904

## 2023-04-08 MED ADMIN — HYDROMORPHONE HCL 1 MG/ML IJ SOLN: .5 mg | INTRAVENOUS | @ 16:00:00 | Stop: 2023-04-08 | NDC 00409128331

## 2023-04-08 MED ADMIN — OXYCODONE-ACETAMINOPHEN 5-325 MG PO TABS: 1 | ORAL | Stop: 2023-04-13 | NDC 00904709361

## 2023-04-08 MED ADMIN — SODIUM CHLORIDE 0.9% IV SOLN (250 ML): 10 mL/h | INTRAVENOUS | @ 19:00:00 | Stop: 2023-04-13 | NDC 00338004902

## 2023-04-08 MED ADMIN — CEFOXITIN 1GM IVPB (MINIBAG): 1 g | INTRAVENOUS | @ 18:00:00 | Stop: 2023-04-08 | NDC 44567024525

## 2023-04-08 MED ADMIN — ONDANSETRON HCL 4 MG/2ML IJ SOLN: 4 mg | INTRAVENOUS | @ 21:00:00 | Stop: 2023-04-08 | NDC 60505613000

## 2023-04-08 MED ADMIN — HEPARIN (PORCINE) IN NACL 25000-0.45 UT/250ML-% IV SOLN: 850 [IU]/h | INTRAVENOUS | @ 22:00:00 | Stop: 2023-04-09 | NDC 63323051701

## 2023-04-08 MED ADMIN — HEPARIN SODIUM (PORCINE) 1000 UNIT/ML IJ SOLN - PER PROTOCOL: 4100 [IU] | INTRAVENOUS | @ 22:00:00 | Stop: 2023-04-08 | NDC 00409272031

## 2023-04-08 MED ADMIN — IOHEXOL 350 MG/ML IV SOLN: 100 mL | INTRAVENOUS | @ 17:00:00 | Stop: 2023-04-08 | NDC 00407141491

## 2023-04-08 MED ADMIN — FLUTICASONE PROPIONATE HFA 220 MCG/ACT IN AERO: 1 | RESPIRATORY_TRACT | @ 23:00:00 | Stop: 2023-04-13

## 2023-04-08 MED ADMIN — HYDROMORPHONE HCL 1 MG/ML IJ SOLN: 1 mg | INTRAVENOUS | @ 21:00:00 | Stop: 2023-04-08 | NDC 00409128331

## 2023-04-08 MED ADMIN — METRONIDAZOLE 500 MG/100ML IV SOLN: 500 mg | INTRAVENOUS | @ 19:00:00 | Stop: 2023-04-08 | NDC 00409015201

## 2023-04-08 MED ADMIN — ONDANSETRON HCL 4 MG/2ML IJ SOLN: 4 mg | INTRAVENOUS | @ 16:00:00 | Stop: 2023-04-08 | NDC 60505613000

## 2023-04-08 MED ADMIN — PANTOPRAZOLE SODIUM 40 MG IV SOLR: 40 mg | INTRAVENOUS | Stop: 2023-04-10 | NDC 00781323295

## 2023-04-08 MED ADMIN — HYDROMORPHONE HCL 1 MG/ML IJ SOLN: 1 mg | INTRAVENOUS | @ 18:00:00 | Stop: 2023-04-08 | NDC 00409128331

## 2023-04-08 NOTE — ED Provider Notes
[facility]  Emergency Department Service Report    Donna Gross 57 y.o. female , presents with Abdominal Pain      Triage   Arrived on 04/08/2023 at 6:58 AM   Arrived by Walk-in [14]    ED Triage Vitals   Temp Temp Source BP Heart Rate Resp SpO2 O2 Device Pain Score Weight   04/08/23 0702 04/08/23 0702 04/08/23 0702 04/08/23 0702 04/08/23 0702 04/08/23 0702 04/08/23 0748 04/08/23 0703 04/08/23 0703   36.6 ?C (97.9 ?F) Oral 157/84 62 18 99 % None (Room air) Ten 50.9 kg (112 lb 3.4 oz)       Pre hospital care:       Allergies   Allergen Reactions    Hydrocodone Other (See Comments)     ''burns a hole in stomach''  Tolerates hydromorphone    Ibuprofen Other (See Comments)     GI discomfort    ''hurts my stomach''    Morphine Itching     Tolerates hydromorphone       History   The history is provided by the patient and medical records. No language interpreter was used.        Donna Gross is a 57 y.o. female with history of anxiety, depression, emphysema, fibromyalgia, gastritis, GERD, pancreatitis, who presents to the ED for abdominal pain with nausea, vomiting, and diarrhea.     Per chart review, patient was admitted from 2/5-2/14/2024 for chronic abdominal pain and diarrhea. During admission patient underwent EGD but was unable to tolerate colonoscopy prep. EGD without evidence of infection or malignancy. CTA of the abdomen revealed narrowing of the SMA concerning for chronic mesenteric ischemia, vascular surgery was consulted and recommended follow up as an outpatient for consideration of revascularization. Patient was discharged home in stable condition with recommendation to follow up with GI and vascular surgery as an outpatient.    Since being discharged patient reports she continues to have chronic abdominal pain. Patient continues to have nausea, vomiting, and diarrhea post prandially. She reports to have had 7 episodes of diarrhea yesterday. She notes that she is now having abdominal bloating/swelling and weight loss, states to have lost unintentionally lost ~40lbs in 1 year. She denies chest pain or shortness of breath          Past Medical History:   Diagnosis Date    Anxiety     Depression     Emphysema, unspecified (HCC/RAF)     Emphysema, unspecified (HCC/RAF)     Fibromyalgia     Gastritis     GERD (gastroesophageal reflux disease)     History of blood transfusion     Intractable nausea and vomiting 01/23/2020    Pancreatitis         Past Surgical History:   Procedure Laterality Date    ABDOMINAL SURGERY      COLON SURGERY          Past Family History   family history includes Diabetes in her brother and father; Heart disease in her mother.       Past Social History   she reports that she has been smoking cigarettes. She uses smokeless tobacco. She reports that she does not drink alcohol and does not use drugs. No history on file for sexual activity.       Physical Exam   Physical Exam  HENT:      Head: Normocephalic.      Mouth/Throat:      Mouth: Mucous membranes are  moist.   Cardiovascular:      Rate and Rhythm: Normal rate and regular rhythm.      Heart sounds: No murmur heard.     No friction rub. No gallop.   Pulmonary:      Effort: Pulmonary effort is normal. No respiratory distress.   Abdominal:      General: There is no distension.      Palpations: Abdomen is soft.      Tenderness: There is generalized abdominal tenderness. There is no guarding or rebound. Negative signs include [name]'s sign and McBurney's sign.      Comments: No RUQ tenderness   Musculoskeletal:         General: Normal range of motion.      Cervical back: Normal range of motion.   Skin:     General: Skin is warm and dry.   Neurological:      General: No focal deficit present.      Mental Status: She is oriented to person, place, and time.   Psychiatric:         Mood and Affect: Mood normal.           Laboratory Results     Labs Reviewed   UA,DIPSTICK - Abnormal; Notable for the following components:       Result Value    Specific Gravity 1.040 (*)     All other components within normal limits   CBC (PERFORMABLE) - Abnormal; Notable for the following components:    Hemoglobin 10.6 (*)     Hematocrit 34.6 (*)     MCH Concentration 30.6 (*)     Red Cell Distribution Width-SD 51.2 (*)     Red Cell Distribution Width-CV 15.9 (*)     Platelet Count, Auto 418 (*)     Mean Platelet Volume 8.4 (*)     All other components within normal limits   LIPASE - Normal   UA,MICROSCOPIC - Normal   LACTATE - Normal   MAGNESIUM - Normal   PHOSPHORUS - Normal   PROTHROMBIN TIME PANEL - Normal   CBC & AUTO DIFFERENTIAL    Narrative:     The following orders were created for panel order CBC & Auto Differential.  Procedure                               Abnormality         Status                     ---------                               -----------         ------                     ZOX[[id number]]                          Abnormal            Final result               Differential, Automated[[id number]]                          Final result  Please view results for these tests on the individual orders.   COMPREHENSIVE METABOLIC PANEL   URINALYSIS W/REFLEX TO CULTURE    Narrative:     The following orders were created for panel order Urinalysis w/Reflex to Culture.  Procedure                               Abnormality         Status                     ---------                               -----------         ------                     UA,Dipstick[[id number]]                  Abnormal            Final result               UA,Microscopic[[id number]]               Normal              Final result                 Please view results for these tests on the individual orders.   DIFFERENTIAL, AUTOMATED (PERFORMABLE)       Imaging Results     US duplex abd limited portable   Final Result by Ellard Artis., MD (04/18 2314)   IMPRESSION:      Patent celiac artery and superior mesenteric artery with borderline, approximately 50%, stenosis of the superior mesenteric artery at the origin.      I, Verdell Carmine, M.D., have reviewed this radiological study personally and I am in full agreement with the findings of the report presented here.         Signed by: Verdell Carmine   04/08/2023 11:14 PM      CT abd+pelvis angiogram wo+w contrast   Final Result by Dorothyann Peng., MD (04/18 1014)   IMPRESSION:      High-grade stenosis of the origin of superior mesenteric artery. Clinically correlate for mesenteric angina.   No direct CT findings of ischemic enteritis or colitis.               Signed by: Carie Caddy   04/08/2023 10:14 AM          Administered Medications     Medication Administration from 04/08/2023 0658 to 04/08/2023 1431         Date/Time Order Dose Route Action Action by Comments     04/08/2023 0907 PDT HYDROmorphone 1 mg/mL inj 0.5 mg 0.5 mg IV Push Given Velva Harman, RN --     04/08/2023 0904 PDT ondansetron 4 mg/2 mL inj 4 mg 4 mg Intravenous Given Velva Harman, RN --     04/08/2023 1142 PDT sodium chloride 0.9% IV soln bolus 1,000 mL 0 mL Intravenous Wilford Sports, Hans [place], RN --     04/08/2023 0904 PDT sodium chloride 0.9% IV soln bolus 1,000 mL 1,000 mL Intravenous New Bag/ Syringe/ [place], [place], RN --     04/08/2023 [phone] PDT iohexol (Omnipaque) 350 mg/mL inj 100 mL 100 mL Intravenous  Given [facility] 3.5cc/sec     04/08/2023 1058 PDT HYDROmorphone 1 mg/mL inj 1 mg 1 mg IV Push Given Velva Harman, RN --     04/08/2023 1142 PDT cefOXitin 1 g IVPB MB ([facility] ED ONLY) 0 g Intravenous Wilford Sports, Hans [place], RN --     04/08/2023 1102 PDT cefOXitin 1 g IVPB MB ([facility] ED ONLY) 1 g Intravenous New Bag/ Syringe/ [place], [place], RN --     04/08/2023 1319 PDT metroNIDAZOLE 500 mg in sodium chloride 100 mL IVPB RTU 0 mg Intravenous Stopped Manio, Benay Spice, RN --     04/08/2023 1139 PDT metroNIDAZOLE 500 mg in sodium chloride 100 mL IVPB RTU 500 mg Intravenous New Bag/ Syringe/ Laren Boom, [place], RN --     04/08/2023 1351 PDT sodium chloride 0.9% IV soln 10 mL/hr Intravenous Rate/Dose Annitta [place], RN --     04/08/2023 1148 PDT sodium chloride 0.9% IV soln 10 mL/hr Intravenous New Bag/ Syringe/ Laren Boom, [place], RN --     04/08/2023 1348 PDT HYDROmorphone 1 mg/mL inj 1 mg 1 mg IV Push Given Velva Harman, RN --     04/08/2023 1346 PDT ondansetron 4 mg/2 mL inj 4 mg 4 mg Intravenous Given Velva Harman, RN --            Procedures   Procedural Sedation  Procedures    ED Course & Medical Decision Making   Leshawn Houseworth is a 57 y.o. female with history of anxiety, depression, emphysema, fibromyalgia, gastritis, GERD, pancreatitis, presents with abdominal pain, nausea, vomiting, and diarrhea. On arrival to the emergency department, patient is in distress due to her diffuse she had no focal abdominal tenderness and no rebound or guarding but was diffusely tender with no tympany.  Labs did not show any leukocytosis.  Normal kidney function.  Transaminases were not elevated.  Lactate normal.  Because prior imaging had shown early mesenteric ischemia and abdominal pain has worsened in the past several weeks we repeated the CTA.  CTA does high-grade stenosis of the origin of the superior mesenteric artery.  I spoke with vascular surgery.  They recommend that we start heparin and speak with IR about potential intervention today.  Patient made NPO.  During the care this patient, there was a change of shift.  The plan at the time of transfer of care was to follow up on IR recommendations for potential procedure today.    ED Course as of 04/11/23 0600   Thu Apr 08, 2023   1022 [RD]   1024 General surgery paged. [AR]   1042 ECG at 7:34am per Dr. Uvaldo Bristle interpretation shows  Normal sinus rhythm at a rate of 64  No ischemic changes   [AR]   1110    High-grade stenosis of the origin of superior mesenteric artery. Clinically correlate for mesenteric angina.  No direct CT findings of ischemic enteritis or colitis.      [RD]   1122 Consult with general surgery. Spoke about the patient's presenting symptoms and exam findings. Will be in to evaluate patient.       [AR]   1353 1:54 PM Consult with IR. Spoke  about the patient's presenting symptoms and exam findings. They will come evaluate the patient later today and recommend that the patient continue to be NPO and admission to medicine at this time.      [SA]   1416 2:17 PM Consult with medicine. Spoke about the patient's presenting symptoms and exam findings.  They will admit the patient to their service.      [SA]      ED Course User Index  [AR] Sharlyne Cai  [RD] Varney Baas., MD  [SA] Sarajane Marek           Medical Decision Making  Amount and/or Complexity of Data Reviewed  Labs: ordered.  Radiology: ordered.  ECG/medicine tests: ordered.    Risk  Prescription drug management.  Decision regarding hospitalization.      Clinical Impression   Tip ~ No ED Diagnosis has been entered for this encounter (even though you may see IP Clinical Impressions auto-pulled below). Please go to the ED Dispo screen to enter an ED Clinical Impression and then refresh your note.     1. Superior mesenteric artery stenosis (HCC/RAF)    2. Chronic mesenteric ischemia (HCC/RAF)    3. Epigastric pain    4. Diarrhea, unspecified type    5. History of pancreatitis    6. Severe protein-calorie malnutrition (HCC/RAF)    7. Tobacco use    8. 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 Fibromyalgia    11. Primary insomnia          Prescriptions     Current Discharge Medication List          Disposition and Follow-up   Disposition: Observation [10]    No future appointments.    Follow up with:  No follow-up provider specified.    Return precautions are specified on After Visit Summary.      Scribe Signature   I, Areli R. have acted as Stage manager for patient Brandyn Thien on behalf of Varney Baas., MD at 04/08/2023 at 9:38 AM. All documentation underwent a comprehensive review by the listed physician(s) and received their approval upon signing.     Scribe Signature   I, Sarajane Marek, have acted as a Stage manager for [employer] on behalf of Dr. Allean Found at 04/08/2023 2:20 PM. All documentation underwent a comprehensive review by the listed physician(s) and received their approval upon signing.     I have reviewed this note as recorded by scribe who is named above. He/She  acted as medical scribe,and I attest that it is an accurate representation of my H&P and other events of the ED visit except as otherwise noted.        Varney Baas., MD  04/11/23 [phone]

## 2023-04-08 NOTE — ED Notes
Patient transported to CT 

## 2023-04-08 NOTE — ED Notes
IR at bedside

## 2023-04-08 NOTE — H&P
HOSPITALIST HISTORY AND PHYSICAL    DATE OF SERVICE: 04/08/2023  ADMISSION DATE: 04/08/2023    ATTENDING PHYSICIAN: Alma Downs MD    PRIMARY CARE PROVIDER: Kelle Darting    CHIEF COMPLAINT: had concerns including Abdominal Pain (Pt reports abdominal swelling and pain, different than her normal chronic abd pain. Pt c/o nausea and diarrhea, but states this has been ongoing since gallbladder surgery last year. ).    HPI:  Donna Gross is a 57 y.o. female with a history of anxiety, depression, emphysema, fibromyalgia, gastritis, GERD, h/o perforated duodenal ulcer and B2 reconstruction (7/1/202),  and h/o biliary pancreatitis s/p lap chole w/ extensive adhesiolysis (03/2022) who is presenting with complaints of abdominal pain, nausea, poor oral intake and diarrhea.     Patient reports worsening upper abdominal pain and abdominal swelling over the past week. She reports also chronic diarrhea, intermittent nausea, and poor oral intake since her cholecystectomy on 4/23 during which time a biliary stent was place which was subsequently removed 05/24 without incident. Patient reports ongoing, worsening abdominal pain with associated poor PO intake and intermittent diarrhea since that time cholecystectomy she has required three hospital admissions (2/24, 07/23, 12/23) for ongoing symptoms. She also reports 50lb weight loss over past year.     Patient currently denies shortness of breath, chest pain, headache, dizziness, lightheadedness, urinary symptoms, melena, hematemesis, or sick contacts.      ED COURSE:  CT A/P obtained   Consulted Vascular Surgery & IR     REVIEW OF SYSTEMS:  A complete review of 14 systems was performed. Additional symptoms were otherwise negative and/or non-contributory, except those listed above.    PAST MEDICAL HISTORY:    Past Medical History:   Diagnosis Date    Anxiety     Depression     Emphysema, unspecified (HCC/RAF)     Emphysema, unspecified (HCC/RAF)     Fibromyalgia     Gastritis GERD (gastroesophageal reflux disease)     History of blood transfusion     Intractable nausea and vomiting 01/23/2020    Pancreatitis        PAST SURGICAL HISTORY:  Past Surgical History:   Procedure Laterality Date    ABDOMINAL SURGERY      COLON SURGERY          FAMILY HISTORY:            Family History   Problem Relation Age of Onset    Heart disease Mother      Diabetes Brother      Diabetes Father      Anesthesia problems Neg Hx      Malignant hypertension Neg Hx      Hypotension Neg Hx      Malignant hyperthermia Neg Hx      Pseudochol deficiency Neg Hx                HOME MEDICATIONS:  Prior to Admission medications    Medication Sig Start Date End Date Taking? Authorizing Provider   acetaminophen 500 mg tablet Take 2 tablets (1,000 mg total) by mouth every six (6) hours as needed for Pain.    PROVIDER, HISTORICAL   albuterol 90 mcg/act inhaler Inhale 2 puffs every six (6) hours as needed for Wheezing or Shortness of Breath.    PROVIDER, HISTORICAL   B Complex Vitamins (VITAMIN B COMPLEX) capsule Take 1 capsule by mouth daily.    PROVIDER, HISTORICAL   cholestyramine 4 g/dose powder Take 1 packet (4 g  total) by mouth three (3) times daily with meals. 07/01/22   Eloisa Northern, MD   clobetasol 0.05% cream Apply topically three (3) times daily as needed (rash).    [provider]   cyanocobalamin 1000 mcg tablet Take 1 tablet (1,000 mcg total) by mouth daily.    PROVIDER, HISTORICAL   DULoxetine 30 mg DR capsule Take 1 capsule (30 mg total) by mouth daily.    PROVIDER, HISTORICAL   fluticasone 220 mcg/act inhaler Inhale 1 by mouth puff daily. 02/03/23   Maque, Micah Flesher., MD   fluticasone-salmeterol 250-50 mcg/act diskus inhaler Inhale 1 puff two (2) times daily.    PROVIDER, HISTORICAL   ipratropium-albuterol 20-100 mcg/act inhaler Inhale 1 puff every six (6) hours as needed for Wheezing or Shortness of Breath.    PROVIDER, HISTORICAL   naloxone 4 mg/0.1 mL nasal spray Call 911. Administer a single spray intranasally into one nostril for opioid overdose. May repeat in 3 minutes if patient is not breathing.. 02/03/23 02/03/24  Maque, Micah Flesher., MD   ondansetron 4 mg tablet Take 1 tablet (4 mg total) by mouth every six (6) hours as needed for Nausea. 11/24/22   Corky Mull., MD   oxyCODONE-acetaminophen 7.5-325 MG per tablet Take 1 tablet by mouth every four (4) hours as needed.    PROVIDER, HISTORICAL   pancrelipase, Lip-Prot-Amyl, (CREON) 36000 units DR capsule Take 1 capsule (36,000 units of lipase total) by mouth three (3) times daily with meals.    PROVIDER, HISTORICAL   QUEtiapine Fumarate (SEROQUEL PO) Take 150 mg by mouth two (2) times daily.    PROVIDER, HISTORICAL       ALLERGIES:  Allergies   Allergen Reactions    Hydrocodone Other (See Comments)     ''burns a hole in stomach''  Tolerates hydromorphone    Ibuprofen Other (See Comments)     GI discomfort    ''hurts my stomach''    Morphine Itching     Tolerates hydromorphone       SOCIAL HISTORY:  Resides in [place]  - Tobacco: smokes 1 cigarette/ day   - EtOH: denies  - Illicits: denies     PHYSICAL EXAM:  BP 144/93  ~ Pulse 63  ~ Temp 36.6 ?C (97.9 ?F) (Oral)  ~ Resp 16  ~ Wt 50.9 kg (112 lb 3.4 oz)  ~ SpO2 95%  ~ BMI 19.26 kg/m?        General: WDWN, alert, ill-appearing, appears stated age, in NAD  HEENT: NCAT, PERRLA, EOMI, OP clear, neck supple, no lymphadenopathy.   Heart: RRR, no m/r/g, peripheral pulses 2+, no edema  Lungs: Normal work of breathing. CTAB, no w/r/r.   Abdomen: Normoactive bowel sounds. Diffuse abdominal pain. No hepatosplenomegaly appreciated.  Skin: No rashes or ulcers, normal skin turgor   Neuro: AOx3. Normal prosody of speech. CNII-XII intact. Gait assessment deferred. Strength and sensation grossly intact.  Psych: Normal mood/affect    LABS:  Labs were ordered and personally reviewed. Labs were compared to prior records obtained from chart and/or outside records when available.  Lab Results   Component Value Date    WBC 6.72 04/08/2023    HGB 10.6 (L) 04/08/2023    HCT 34.6 (L) 04/08/2023    PLT 418 (H) 04/08/2023     Lab Results   Component Value Date    NA 141 04/08/2023    K 3.6 04/08/2023    CL 105 04/08/2023    CO2 21 04/08/2023  BUN 8 04/08/2023    CREAT 0.86 04/08/2023    GLUCOSE 86 04/08/2023    CALCIUM 9.7 04/08/2023    MG 1.4 04/08/2023    PHOS 4.0 04/08/2023     Lab Results   Component Value Date    ALT 8 04/08/2023    AST 24 04/08/2023    BILITOT 0.2 04/08/2023    ALKPHOS 77 04/08/2023    ALBUMIN 4.5 04/08/2023     Lab Results   Component Value Date    APTT 27.3 04/08/2021    PT 13.3 01/27/2023    INR 1.0 01/27/2023     Lab Results   Component Value Date    CKTOT 115 04/09/2021    CKMB 1.9 12/09/2015    TROPONIN 5 (H) 06/17/2022    BNP 215 (H) 02/01/2023          MICROBIOLOGY:  Microbiology data was ordered and personally reviewed. Prior microbiology data was reviewed when available.      IMAGING/STUDIES:  Imaging was ordered and personally reviewed. Imaging was compared to prior imaging when available.        Date/Result:   CT abd+pelvis angiogram wo+w contrast    Result Date: 04/08/2023  CT ABD+PELVIS ANGIOGRAM WO+W CONTRAST CLINICAL HISTORY: chronic messenteric ischemia. COMPARISON: CT angiogram from January 31, 2023 TECHNIQUE: On a multirow-detector CT scanner, a volumetric pre- and post-contrast scan was performed through the abdomen and pelvis in both the arterial and venous phases. Post processed CTA MIP reformatted images were provided. CONTRAST: iohexol (Omnipaque) 350 mg/mL inj 100 mL RADIATION DOSE: The patient received the following exposure event(s) during this study, and the dose reference values for each are as shown (CTDIvol in mGy, DLP in mGy-cm). Note that the values are not patient dose but numbers generated from scan acquisition factors based on 32 cm (L) and/or 16 cm (S) phantoms and may substantially under-estimate or over-estimate actual patient dose based on patient size and other factors. PreMonitoring, CTDI(L): 0.8, DLP: 0.8;Monitoring, CTDI(L): 4.5, DLP: 4.5;Abd wo, CTDI(L): 4.6, DLP: 114.7;Arterial, CTDI(L): 7, DLP: 210;Venous, CTDI(L): 7.9, DLP: 356.3 FINDINGS: VASCULAR FINDINGS: Active extravasation: None. Hematoma: None. Atherosclerotic calcifications: Moderate. Aorta: No aneurysm or dissection. Celiac artery: Patent with nonocclusive plaque at the origin. SMA: There is near circumferential atheromatous plaque at the origin of the SMA. The central lumen of the vessel is thrombosed. There is a thin rim of residual contrast flow at the perimeter of the vessel where it is not calcified. The residual lumen measures between 0.9 and 1.4 mm, in keeping with high-grade stenosis, similar in degree to prior examination. There is no intimal dissection. IMA: Patent. Renal arteries: Patent. SMV: Patent. IMV: Patent. IVC: Patent. ADDITIONAL FINDINGS: Lower chest: Moderate emphysematous lung disease including subpleural paraseptal emphysema of the right lower lobe lateral basal segment. There is diffuse parabronchial thickening and cylindrical bronchiolectasis of the bilateral lower lobes. Scattered foci of septal thickening and architectural distortion in keeping with chronic postinflammatory changes. Near complete resolution of pleural effusions. Trace residual low-density pleural thickening in the right posterior sulcus in keeping with residual pleural fluid versus chronic organized pleural reaction. Liver: Unremarkable. Gallbladder and bile ducts: Surgically absent gallbladder with mildly dilated bile ducts, likely related to postcholecystectomy state. Spleen: Unremarkable. Pancreas: Mildly atrophic. Adrenals: Unremarkable. Kidneys and ureters: Unremarkable. Bowel: Status post partial gastrectomy and gastrojejunostomy. Nondilated bowel with no wall thickening. Large stool volume. Normal appendix. Bladder: Unremarkable. Reproductive organs: Unremarkable. Lymph nodes: Unremarkable. Peritoneum: No free fluid or free air. Abdominal wall: Postsurgical  changes in the anterior abdominal wall. Diffuse body wall edema. Dystrophic calcifications in the gluteal subcutaneous tissues.. Bones: Multilevel degenerative changes of the spine with grade 1 spondylotic anterolisthesis of L5-S1. Severely degenerated L5-S1 disc with vacuum disc phenomenon and extensive discogenic endplate sclerosis.     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 Clinically correlate for mesenteric angina. No direct CT findings of ischemic enteritis or colitis. Signed by: Carie Caddy   04/08/2023 10:14 AM      ASSESSMENT/PLAN:  Donna Gross is a 57 y.o. 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     #SMA Stenosis, POA  #Acute on Chronic Abdominal Pain, POA   -Appreciate Vascular Surgery consult and recs:    - Keep NPO  - STAT IR consult, consideration of thrombolysis and SMA stent  - Initiate therapeutic heparin gtt given thrombosis on CTA within high-grade SMA stenosis  - Trend lactate q6h  -Appreciate IR consult and recs  -Pain Management  -mIVF while NPO     #Diarrhea- POA, chronic  #hx of pancreatitis  #Severe protein calorie malnutrition, POA  C diff, H pylori, celiac studies, parasite enteric panel negative. EGD without acute source. Patient previously unable to tolerate colonoscopy. Previously seen by GI during recent hospitalization Outpatient colonoscopy, could consider SIBO testing outpatient.   - Tylenol/Oxy/Dilaudid and lidocaine patch for pain  - Consult to Nutrition Services   - Zofran 4 mg PRN 1st line, compazine 2nd line for nausea   - PPI BID  - hold home pancrelipase (creon) 36,000 u TIDAC while NPO  - hold home cholestyramine 4g TID once diet while NPO  - patient unable to tolerate prep for colonoscopy, will plan for outpatient   - vascular surgery consulted for SMA narrowing as above     #Tobacco Use, POA  - nicotine patch   - counseled about nicotine cessation     Chronic/stable:  #Anxiety  #Depression  #Fibromyalgia   #Insomnia  - Continue Seroquel 150mg  BID  - Continue duloxetine 40 mg qday   - Trazodone 50 mg at bedtime PRN      #Inpatient Checklist:  Orders Placed This Encounter      Diet NPO Reason for NPO: Procedure    VTE Prophylaxis:  heparin gtt  Central Lines: none  Tubes/Drains: none  Activity: as tolerated  Precautions: No infectious isolation, standard and fall precautions.    #CODE: Prior  Primary Emergency Contact: [name],[name]    #DISPO: Anticipate post-hospitalization destination to be home when medically stable     DWA Alma Downs MD     Signed,  Bernadene Bell, NP  04/08/2023 at 2:21 PM    CC:  Kelle Darting, MD  [address] / [place] [address]  Office: [phone]  Fax: [phone]    Attending Addendum  57y/o F with complex surgical history with multiple prior ex-lap and need for adhesiolysis (refer to operative history tab for greater detail), anxiety/depression. Most recently lap cholecystectomy w/ adhesiolysis on 03/27/22 for management of biliary pancreatitis. Pt presents today for evaluation of acute on chronic abd (post-prandial) abd pain. Recent imaging is notable for high-grade stenosis of SMA. Reassuring lactate wnl and CT imaging on admission not c/w ischemic enteritis/colitis. ER consulted vascular surgery who recommended IR input re: possible SMA stent placement.       I have seen  and examined the patient. I agree with the nurse practitioner's findings and plan of care as documented above. The above documentation was reviewed and edited as indicated. 40 minutes of non-overlapping time were spent (of which specifically 11 minutes was spent on smoking cessation counseling), representing greater than 50% of of the total MD/NP visit time of 75 minutes.      Danny Lawless, MD  [facility]  Pager [phone]  04/08/2023 10:08 PM

## 2023-04-08 NOTE — Consults
VASCULAR SURGERY CONSULT NOTE    PATIENT: Donna Gross  MRN: [id number]  DOB: 1966-03-14  DATE OF SERVICE: 04/08/2023  ADMISSION DIAGNOSIS:  No admission diagnoses are documented for this encounter.  LENGTH OF STAY:  LOS: 0 days        REFERRING PRACTITIONER: No ref. provider found  PRIMARY CARE PROVIDER: Kelle Darting, MD    Reason for Consultation: SMA stenosis    Chief Complaint:   Chief Complaint   Patient presents with    Abdominal Pain     Pt reports abdominal swelling and pain, different than her normal chronic abd pain. Pt c/o nausea and diarrhea, but states this has been ongoing since gallbladder surgery last year.        HPI: Donna Gross is a 57 y.o. female who presents for 1 week of worsening abdominal bloating and pain. She has a known SMA stenosis with chronic symptoms (~1 yr) of post-prandial emesis (1-2x per week) of undigested food, abdominal discomfort, and post-prandial diarrhea. Patient reports symptoms began 1 year ago ''after surgery''. 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 Most recent operation was lap chole in 03/2022 c/b subhepatic abscess requiring IR drainage and bile leak requiring ERCP/CBD stent.    Reporting 1 week of upper abdominal distension and worsening intermittent colicky abdominal pain. Chronic nausea and emesis of undigested foods, 1-2x per week, not worsened recently. Chronic diarrhea is also stable.    Past Medical History    Past Medical History:   Diagnosis Date    Anxiety     Depression     Emphysema, unspecified (HCC/RAF)     Emphysema, unspecified (HCC/RAF)     Fibromyalgia     Gastritis     GERD (gastroesophageal reflux disease)     History of blood transfusion     Intractable nausea and vomiting 01/23/2020    Pancreatitis        Past Surgical History    Past Surgical History:   Procedure Laterality Date    ABDOMINAL SURGERY      COLON SURGERY         Family History    Family History   Problem Relation Age of Onset    Heart disease Mother     Diabetes Brother     Diabetes Father     Anesthesia problems Neg Hx     Malignant hypertension Neg Hx     Hypotension Neg Hx     Malignant hyperthermia Neg Hx     Pseudochol deficiency Neg Hx        Social History    Social History     Socioeconomic History    Marital status: Single   Tobacco Use    Smoking status: Every Day     Years: 20     Types: Cigarettes    Smokeless tobacco: Current    Tobacco comments:     1 cig per day   Substance and Sexual Activity    Alcohol use: No    Drug use: No   Social History Narrative    Lives w/ mom and daughter     Social Determinants of Health     Financial Resource Strain: Low Risk  (01/29/2023)    Financial Resource Strain     Difficulty of Paying Living  Expenses: Not very hard       Medications    Current Facility-Administered Medications   Medication Dose Route Frequency    [COMPLETED] cefOXitin 1 g IVPB MB ([facility] ED ONLY)  1 g Intravenous STAT    [COMPLETED] HYDROmorphone 1 mg/mL inj 0.5 mg  0.5 mg IV Push STAT    [COMPLETED] HYDROmorphone 1 mg/mL inj 1 mg  1 mg IV Push STAT    [COMPLETED] iohexol (Omnipaque) 350 mg/mL inj 100 mL  100 mL Intravenous Once    metroNIDAZOLE 500 mg in sodium chloride 100 mL IVPB RTU  500 mg Intravenous Once    [COMPLETED] ondansetron 4 mg/2 mL inj 4 mg  4 mg Intravenous Once    [COMPLETED] sodium chloride 0.9% IV soln bolus 1,000 mL  1,000 mL Intravenous STAT    sodium chloride 0.9% IV soln  10 mL/hr Intravenous Continuous     Current Outpatient Medications   Medication Sig    acetaminophen 500 mg tablet Take 2 tablets (1,000 mg total) by mouth every six (6) hours as needed for Pain.    albuterol 90 mcg/act inhaler Inhale 2 puffs every six (6) hours as needed for Wheezing or Shortness of Breath.    B Complex Vitamins (VITAMIN B COMPLEX) capsule Take 1 capsule by mouth daily.    cholestyramine 4 g/dose powder Take 1 packet (4 g total) by mouth three (3) times daily with meals.    clobetasol 0.05% cream Apply topically three (3) times daily as needed (rash).    cyanocobalamin 1000 mcg tablet Take 1 tablet (1,000 mcg total) by mouth daily.    DULoxetine 30 mg DR capsule Take 1 capsule (30 mg total) by mouth daily.    fluticasone 220 mcg/act inhaler Inhale 1 by mouth puff daily.    fluticasone-salmeterol 250-50 mcg/act diskus inhaler Inhale 1 puff two (2) times daily.    ipratropium-albuterol 20-100 mcg/act inhaler Inhale 1 puff every six (6) hours as needed for Wheezing or Shortness of Breath.    naloxone 4 mg/0.1 mL nasal spray Call 911. Administer a single spray intranasally into one nostril for opioid overdose. May repeat in 3 minutes if patient is not breathing..    ondansetron 4 mg tablet Take 1 tablet (4 mg total) by mouth every six (6) hours as needed for Nausea.    oxyCODONE-acetaminophen 7.5-325 MG per tablet Take 1 tablet by mouth every four (4) hours as needed.    pancrelipase, Lip-Prot-Amyl, (CREON) 36000 units DR capsule Take 1 capsule (36,000 units of lipase total) by mouth three (3) times daily with meals.    QUEtiapine Fumarate (SEROQUEL PO) Take 150 mg by mouth two (2) times daily.       Allergies    Hydrocodone, Ibuprofen, and Morphine      Review of Systems:  A full 14 point review of systems was performed and is negative except as above in the HPI.    Physical Exam:      Vitals:BP 145/66  ~ Pulse 69  ~ Temp 36.6 ?C (97.9 ?F) (Oral)  ~ Resp 22  ~ Wt 112 lb 3.4 oz (50.9 kg)  ~ SpO2 94%  ~ BMI 19.26 kg/m?  Body mass index is 19.26 kg/m?Marland Kitchen    General: NAD, alert and oriented x3    HEENT: Normocephalic, atraumatic     Neck: Trachea midline, no adenopathy    Lungs: clear to auscultation bilaterally    Heart: regular rate and rhythm    Abdomen: soft, non-tender; bowel  sounds normal; no masses,  no organomegaly. Mild distension. No appreciable hernia amongst significant surgical scars    Extremities: Warm and well perfused        Lab Review:  Recent Results (from the past 24 hour(s))   ED INFORMATION EXCHANGE Ordered by an unspecified provider    Collection Time: 04/08/23  6:58 AM   Result Value Ref Range    Emer. Dept. Info Exchange - Care Plan      Emer. Dept. Engineer, mining. Dept. Info Exchange - 30 day Visit Count 1     Emer. Dept. Info Exchange - 180 day Visit Count 3    ECG 12 lead    Collection Time: 04/08/23  7:34 AM   Result Value Ref Range    Ventricular Rate 64 BPM    Atrial Rate 64 BPM    P-R Interval 112 ms    QRS Duration 85 ms    Q-T Interval 420 ms    QTC Calculation (Bezet) 434 ms    P Axis 83 degrees    R Axis 69 degrees    T Axis 81 degrees    Diagnosis Sinus rhythm     Diagnosis Borderline short PR interval     Diagnosis Abnormal R-wave progression, early transition     Diagnosis 12 Lead; Mason-Likar     Diagnosis Otherwise Normal ECG    Comprehensive Metabolic Panel    Collection Time: 04/08/23  8:05 AM   Result Value Ref Range    Sodium 141 135 - 146 mmol/L    Potassium 3.6 3.6 - 5.3 mmol/L    Chloride 105 96 - 106 mmol/L    Total CO2 21 20 - 30 mmol/L    Anion Gap 15 8 - 19 mmol/L    Glucose 86 65 - 99 mg/dL    Creatinine 8.11 9.14 - 1.30 mg/dL    Estimated GFR 78 See GFR Additional Information mL/min/1.4m2    GFR Additional Information See Comment     Urea Nitrogen 8 7 - 22 mg/dL    Calcium 9.7 8.6 - 78.2 mg/dL    Total Protein 7.4 6.1 - 8.2 g/dL    Albumin 4.5 3.9 - 5.0 g/dL    Bilirubin,Total 0.2 0.1 - 1.2 mg/dL    Alkaline Phosphatase 77 37 - 113 U/L    Aspartate Aminotransferase 24 13 - 62 U/L    Alanine Aminotransferase 8 8 - 70 U/L   Lipase    Collection Time: 04/08/23  8:05 AM   Result Value Ref Range    Lipase 34 13 - 69 U/L   Lactate    Collection Time: 04/08/23  8:05 AM   Result Value Ref Range    Blood Lactate 18 5 - 18 mg/dL   CBC    Collection Time: 04/08/23  8:05 AM   Result Value Ref Range    White Blood Cell Count 6.72 4.16 - 9.95 x10E3/uL    Red Blood Cell Count 3.98 3.96 - 5.09 x10E6/uL    Hemoglobin 10.6 (L) 11.6 - 15.2 g/dL    Hematocrit 95.6 (L) 34.9 - 45.2 %    Mean Corpuscular Volume 86.9 79.3 - 98.6 fL    Mean Corpuscular Hemoglobin 26.6 26.4 - 33.4 pg    MCH Concentration 30.6 (L) 31.5 - 35.5 g/dL    Red Cell Distribution Width-SD 51.2 (H) 36.9 - 48.3 fL    Red Cell Distribution Width-CV 15.9 (H) 11.1 -  15.5 %    Platelet Count, Auto 418 (H) 143 - 398 x10E3/uL    Mean Platelet Volume 8.4 (L) 9.3 - 13.0 fL    Nucleated RBC%, automated 0.0 No Ref. Range %    Absolute Nucleated RBC Count 0.00 0.00 - 0.00 x10E3/uL    Neutrophil Abs (Prelim) 4.24 See Absolute Neut Ct. x10E3/uL   Differential, Automated    Collection Time: 04/08/23  8:05 AM   Result Value Ref Range    Neutrophil Percent, Auto 63.1 No Ref. Range %    Lymphocyte Percent, Auto 30.7 No Ref. Range %    Monocyte Percent, Auto 4.0 No Ref. Range %    Eosinophil Percent, Auto 1.3 No Ref. Range %    Basophil Percent, Auto 0.6 No Ref. Range %    Immature Granulocytes% 0.3 No Reference Range %    Absolute Neut Count 4.24 1.80 - 6.90 x10E3/uL    Absolute Lymphocyte Count 2.06 1.30 - 3.40 x10E3/uL    Absolute Mono Count 0.27 0.20 - 0.80 x10E3/uL    Absolute Eos Count 0.09 0.00 - 0.50 x10E3/uL    Absolute Baso Count 0.04 0.00 - 0.10 x10E3/uL    Absolute Immature Gran Count 0.02 0.00 - 0.04 x10E3/uL   Magnesium    Collection Time: 04/08/23  8:05 AM   Result Value Ref Range    Magnesium 1.4 1.4 - 1.9 mEq/L   Phosphorus    Collection Time: 04/08/23  8:05 AM   Result Value Ref Range    Phosphorus 4.0 2.3 - 4.4 mg/dL       Imaging:  US duplex abd limited portable    Result Date: 04/08/2023  US DUPLEX ABD LIMITED PORTABLE CLINICAL HISTORY: Evaluate SMA stenosis. COMPARISON: CTA abdomen/pelvis dated 04/08/2023. TECHNIQUE: Real time 2D grey scale, spectral and color flow Doppler imaging of the celiac and superior mesenteric artery was performed. FINDINGS: Atherosclerotic plaque in abdominal aorta: Moderate Waveforms: Normal low resistance waveforms throughout  Blood flow velocities are as follows: Abdominal aorta: 64 cm/s Celiac artery, proximal: 88 cm/s (normal < 200 cm/s) Celiac artery, distal: 129 cm/s SMA, proximal: 268 cm/s (normal < 275 cm/s) SMA, mid: 265 cm/s SMA, distal: 224 cm/s     IMPRESSION: Patent celiac artery and superior mesenteric artery with borderline, approximately 50%, stenosis of the superior mesenteric artery at the origin. I, Verdell Carmine, M.D., have reviewed this radiological study personally and I am in full agreement with the findings of the report presented here. Signed by: Verdell Carmine   04/08/2023 11:14 PM    CT abd+pelvis angiogram wo+w contrast    Result Date: 04/08/2023  CT ABD+PELVIS ANGIOGRAM WO+W CONTRAST CLINICAL HISTORY: chronic messenteric ischemia. COMPARISON: CT angiogram from January 31, 2023 TECHNIQUE: On a multirow-detector CT scanner, a volumetric pre- and post-contrast scan was performed through the abdomen and pelvis in both the arterial and venous phases. Post processed CTA MIP reformatted images were provided. CONTRAST: iohexol (Omnipaque) 350 mg/mL inj 100 mL RADIATION DOSE: The patient received the following exposure event(s) during this study, and the dose reference values for each are as shown (CTDIvol in mGy, DLP in mGy-cm). Note that the values are not patient dose but numbers generated from scan acquisition factors based on 32 cm (L) and/or 16 cm (S) phantoms and may substantially under-estimate or over-estimate actual patient dose based on patient size and other factors. PreMonitoring, CTDI(L): 0.8, DLP: 0.8;Monitoring, CTDI(L): 4.5, DLP: 4.5;Abd wo, CTDI(L): 4.6, DLP: 114.7;Arterial, CTDI(L): 7, DLP: 210;Venous, CTDI(L): 7.9, DLP: 356.3 FINDINGS:  VASCULAR FINDINGS: Active extravasation: None. Hematoma: None. Atherosclerotic calcifications: Moderate. Aorta: No aneurysm or dissection. Celiac artery: Patent with nonocclusive plaque at the origin. SMA: There is near circumferential atheromatous plaque at the origin of the SMA. The central lumen of the vessel is thrombosed. There is a thin rim of residual contrast flow at the perimeter of the vessel where it is not calcified. The residual lumen measures between 0.9 and 1.4 mm, in keeping with high-grade stenosis, similar in degree to prior examination. There is no intimal dissection. IMA: Patent. Renal arteries: Patent. SMV: Patent. IMV: Patent. IVC: Patent. ADDITIONAL FINDINGS: Lower chest: Moderate emphysematous lung disease including subpleural paraseptal emphysema of the right lower lobe lateral basal segment. There is diffuse parabronchial thickening and cylindrical bronchiolectasis of the bilateral lower lobes. Scattered foci of septal thickening and architectural distortion in keeping with chronic postinflammatory changes. Near complete resolution of pleural effusions. Trace residual low-density pleural thickening in the right posterior sulcus in keeping with residual pleural fluid versus chronic organized pleural reaction. Liver: Unremarkable. Gallbladder and bile ducts: Surgically absent gallbladder with mildly dilated bile ducts, likely related to postcholecystectomy state. Spleen: Unremarkable. Pancreas: Mildly atrophic. Adrenals: Unremarkable. Kidneys and ureters: Unremarkable. Bowel: Status post partial gastrectomy and gastrojejunostomy. Nondilated bowel with no wall thickening. Large stool volume. Normal appendix. Bladder: Unremarkable. Reproductive organs: Unremarkable. Lymph nodes: Unremarkable. Peritoneum: No free fluid or free air. Abdominal wall: Postsurgical changes in the anterior abdominal wall. Diffuse body wall edema. Dystrophic calcifications in the gluteal subcutaneous tissues.. Bones: Multilevel degenerative changes of the spine with grade 1 spondylotic anterolisthesis of L5-S1. Severely degenerated L5-S1 disc with vacuum disc phenomenon and extensive discogenic endplate sclerosis.     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 Clinically correlate for mesenteric angina. No direct CT findings of ischemic enteritis or colitis. Signed by: Carie Caddy   04/08/2023 10:14 AM         Assessment and Recommendations:     This is a Donna Gross is a 57 y.o. female with known SMA stenosis and symptoms consistent with chronic mesenteric ischemia presenting with acute abdominal pain and distension, acute on chronic non-bloody diarrhea and non-occlusive thrombus of SMA on CTA. Unclear whether this is acute vs. Chronic and whether her symptoms are primarily of vascular etiology, however given there is reasonable concern, recommend admission for therapeutic anticoagulation and serial abdominal exams, consideration of urgent intervention of clinically worsening.     - Keep NPO  - STAT IR consult, consideration of thrombolysis and SMA stent  - Initiate therapeutic heparin gtt given thrombosis on CTA within high-grade SMA stenosis  - Trend lactate q6h  - Agree with GI consult  - Surgery will continue to follow    Patient and plan discussed with attending Vascular surgeon Dr. Keene Breath    Author: Nolon Bussing. Kieth Brightly, MD 04/08/2023 12:11 PM

## 2023-04-08 NOTE — Progress Notes
Pharmaceutical Services - Heparin Consult Management Note    Objective Data   Allergies: Hydrocodone, Ibuprofen, and Morphine  Height:   Most recent documented height   03/29/23 1.626 m (5' 4'')     Actual Weight:   Most recent documented weight   04/08/23 50.9 kg       Heparin drip administrations (last 48 hours)       None            Current Labs  Recent Labs     04/08/23  1319   APTT 30.8     No results for input(s): ''HEPANTIXAUFH'' in the last 48 hours.  Recent Labs     04/08/23  0805   WBC 6.72   HGB 10.6*   HCT 34.6*   MCV 86.9   PLT 418*       INR   Date Value Ref Range Status   04/08/2023 1.0 . Final     Comment:     Therapeutic Range: INR 2-3  Mechanical Valves: INR 2.5-3.5       INR (Outside Lab)   Date Value Ref Range Status   03/09/2021 0.90  Final     Bilirubin,Total   Date Value Ref Range Status   04/08/2023 0.2 0.1 - 1.2 mg/dL Final       Assessment:  Protocol: Regular Intensity: Anti-Xa goal 0.3-0.7 or aPTT goal 66-108  Anti-Xa aPTT Re-bolus if Ordered  (NTE 5,000 units) /  Hold Infusion Rate Change Check Anti-Xa/aPTT   <0.1 <46 50 units/kg Increase by 4 units/kg/hr 6 hours after rate change   0.1-0.19 46-55.9 25 units/kg Increase by 3 units/kg/hr    0.2-0.29 56-65.9 - Increase by 2 units/kg/hr    0.3-0.7 66-108 Continue current rate 6 hours;  If therapeutic x2, recheck qAM   0.71-0.8 108.1-118 - Decrease by 1 unit/kg/hr 6 hours after rate change   0.81-0.9 118.1-128 - Decrease by 2 units/kg/hr    0.91-1 128.1-140 Hold for 1 hour Decrease by 3 units/kg/hr    >1 >140 Hold infusion and repeat STAT anti-Xa/aPTT in 1 hour.  If anti-Xa ? 1 or aPTT ? 140, restart infusion and decrease by 4 units/kg/hr. Repeat anti-Xa/aPTT 6 hours after rate change.  If anti-Xa > 1 or aPTT > 140, continue to hold and repeat anti-Xa/aPTT in 1 hour. Notify provider.       Indication: arterial thrombosis  Goal: anti-Xa 0.3-0.7  Dosing weight: 50kg  Concurrent warfarin: No  Concomitant medications that may contribute to bleeding: duloxetine  Pending invasive procedures: pending IR consult for thrombolysis/stent   Signs and symptoms of bleeding/bruising within 24 hours: No at baseline RN with no concerns for s/sx of bleeding       Plan:  Donna Gross is a 57 y.o. female who has been referred to pharmacy for therapeutic heparin management.     Bolus: Give 4100 units once  Infusion: Initiate at 900 units/hr    Next anti-Xa/aPTT ordered at 2130   Plan was communicated to RN.  Pharmacy will continue to monitor the patient?s clinical progress and communicate any changes with treatment team.  For questions about this patient's heparin therapy, please call [phone] [facility]) or [phone] [facility]).    Lilla Shook., PharmD, 04/08/2023, 2:59 PM

## 2023-04-08 NOTE — ED Notes
[facility]?NOTIFICATION?04/08/2023 06:58?Alethia Berthold?MRN: [id number]    [name]:   YQM:?[id number]  Account [id number]  Billing Account [id number]      Criteria Met      History of Sepsis Dx    Security and Safety  No Security Events were found.  ED Care Guidelines  There are currently no ED Care Guidelines for this patient. Please check your facility's medical records system.    Flags      History of Sepsis - Patient has received a diagnosis of Sepsis from an acute or post-acute setting. Apply appropriate clinical planning practices; to learn more visit [email or web address] / Attributed By: [employer] / Attributed On: 04/09/2022         Prescription Drug Data  No Prescription Drug Data was found.    E.D. Visit Count (12 mo.)  Facility Visits   [facility] 3   [address] [facility] 1   [facility] 1   Total 5   Note: Visits indicate total known visits.     Recent Emergency Department Visit Summary  Date Facility [facility] Type Diagnoses or Chief Complaint    Apr 08, 2023  [facility].  [place]  Emergency     Jul 25, 2022  [name]  [place]  Emergency  Chief Complaint: ABD PAIN    Jul 23, 2022  [facility].  [place]  Emergency      1. Right lower quadrant pain      1. Abdominal Pain      2. Left lower quadrant pain      May 19, 2022  [facility]  [place].  [place]  Emergency      Other long term (current) drug therapy      Acquired absence of other specified parts of digestive tract      Unspecified abdominal pain      Chronic obstructive pulmonary disease with (acute) exacerbation      May 18, 2022  [facility] [place].  [place]  [place]gency      1. Unspecified abdominal pain      1. Abdominal Pain        Recent Inpatient Visit Summary  Date Facility [facility] Type Diagnoses or Chief Complaint    Feb 02, 2023  [facility].  [place]  CT Scan      1. Dehydration      2. Generalized abdominal pain      3. Abnormal weight loss      4. Unspecified abdominal pain      5. Acute kidney failure, unspecified      6. Diarrhea, unspecified      7. Nausea with vomiting, unspecified      Apr 25, 2022  [facility].  [place]  XRay      1. Nausea with vomiting, unspecified      1. Unspecified abdominal pain      1. Unspecified complication of procedure, initial encounter      2. Disease of biliary tract, unspecified      3. Peritoneal abscess      4. Other bacterial infections of unspecified site      4. Abdominal Pain      5. Extended spectrum beta lactamase (ESBL) resistance      7. Epigastric pain      8. Other disorders of  electrolyte and fluid balance, not elsewhere classified        Care Team  [facility] Phone Fax Service Dates   Sela Hilding, M.D. Family Medicine   Current      [facility]  This patient has registered at the  [facility] Emergency Department  For more information visit: [email or web address]   PLEASE NOTE:     1.   Any care recommendations and other clinical information are provided as guidelines or for historical purposes only, and providers should exercise their own clinical judgment when providing care.    2.   You may only use this information for purposes of treatment, payment or health care operations activities, and subject to the limitations of applicable [facility] Policies.    3.   You should consult directly with the organization that provided a care guideline or other clinical history with any questions about additional information or accuracy or completeness of information provided.    ? 2024 [facility] - [email or web address]

## 2023-04-08 NOTE — ED Notes
Pt needs an PIV Labs and CT scan  The existing PIV is none  Attempted iv sticks  by primary nurse.   Ultrasound guide is used to place # 20 g PIV on the RAC with +dark blood return, using aseptic technique. Pt tolerated the IV insertion well. The IV insertion was done without any issues. Dressing is applied and site is secured. Notified primary nurse.

## 2023-04-08 NOTE — ED Notes
Report given to Mal Misty, RN for lunch relief.

## 2023-04-09 DIAGNOSIS — Z720 Tobacco use: Secondary | ICD-10-CM

## 2023-04-09 DIAGNOSIS — M797 Fibromyalgia: Secondary | ICD-10-CM

## 2023-04-09 DIAGNOSIS — F5101 Primary insomnia: Secondary | ICD-10-CM

## 2023-04-09 DIAGNOSIS — F324 Major depressive disorder, single episode, in partial remission: Secondary | ICD-10-CM

## 2023-04-09 DIAGNOSIS — Z8719 Personal history of other diseases of the digestive system: Secondary | ICD-10-CM

## 2023-04-09 DIAGNOSIS — E43 Unspecified severe protein-calorie malnutrition: Secondary | ICD-10-CM

## 2023-04-09 DIAGNOSIS — R197 Diarrhea, unspecified: Secondary | ICD-10-CM

## 2023-04-09 DIAGNOSIS — F419 Anxiety disorder, unspecified: Secondary | ICD-10-CM

## 2023-04-09 LAB — Basic Metabolic Panel: UREA NITROGEN: 10 mg/dL (ref 7–22)

## 2023-04-09 LAB — Magnesium: MAGNESIUM: 1.3 meq/L — ABNORMAL LOW (ref 1.4–1.9)

## 2023-04-09 LAB — Blood Lactate
BLOOD LACTATE: 19 mg/dL — ABNORMAL HIGH (ref 5–18)
BLOOD LACTATE: 6 mg/dL (ref 5–18)
BLOOD LACTATE: 9 mg/dL (ref 5–18)

## 2023-04-09 LAB — Unfractionated Heparin, by anti-Xa
HEPARIN UNFRACTIONATED: 0.71 [IU]/mL
HEPARIN UNFRACTIONATED: 0.53 [IU]/mL
HEPARIN UNFRACTIONATED: 0.39 [IU]/mL

## 2023-04-09 LAB — CBC: HEMOGLOBIN: 9.3 g/dL — ABNORMAL LOW (ref 11.6–15.2)

## 2023-04-09 MED ADMIN — OXYCODONE-ACETAMINOPHEN 5-325 MG PO TABS: 1 | ORAL | @ 14:00:00 | Stop: 2023-04-13 | NDC 00904709361

## 2023-04-09 MED ADMIN — HYDROMORPHONE HCL 1 MG/ML IJ SOLN: .6 mg | INTRAVENOUS | @ 06:00:00 | Stop: 2023-04-13 | NDC 00409128331

## 2023-04-09 MED ADMIN — LIDOCAINE 5 % EX PTCH: 1 | TRANSDERMAL | @ 11:00:00 | Stop: 2023-04-13

## 2023-04-09 MED ADMIN — PANTOPRAZOLE SODIUM 40 MG IV SOLR: 40 mg | INTRAVENOUS | @ 23:00:00 | Stop: 2023-04-10 | NDC 71288060010

## 2023-04-09 MED ADMIN — OXYCODONE-ACETAMINOPHEN 5-325 MG PO TABS: 1 | ORAL | @ 23:00:00 | Stop: 2023-04-13 | NDC 00904709361

## 2023-04-09 MED ADMIN — POTASSIUM CHLORIDE 40 MEQ/500 ML (PERIPHERAL): 40 meq | INTRAVENOUS | @ 18:00:00 | Stop: 2023-04-09 | NDC 63323096502

## 2023-04-09 MED ADMIN — OXYCODONE-ACETAMINOPHEN 5-325 MG PO TABS: 1 | ORAL | @ 04:00:00 | Stop: 2023-04-15 | NDC 00904709361

## 2023-04-09 MED ADMIN — ZZ IMS TEMPLATE: 150 mg | ORAL | @ 18:00:00 | Stop: 2023-05-09 | NDC 60687034911

## 2023-04-09 MED ADMIN — FLUTICASONE-SALMETEROL 250-50 MCG/ACT IN AEPB: 1 | RESPIRATORY_TRACT | @ 04:00:00 | Stop: 2023-04-13

## 2023-04-09 MED ADMIN — NICOTINE 7 MG/24HR TD PT24: 7 mg | TRANSDERMAL | Stop: 2023-04-13 | NDC 00536589488

## 2023-04-09 MED ADMIN — CHOLESTYRAMINE 4 G PO PACK: 4 g | ORAL | @ 14:00:00 | Stop: 2023-04-10 | NDC 27241013421

## 2023-04-09 MED ADMIN — FLUTICASONE-SALMETEROL 250-50 MCG/ACT IN AEPB: 1 | RESPIRATORY_TRACT | @ 16:00:00 | Stop: 2023-04-13 | NDC 00054032756

## 2023-04-09 MED ADMIN — LIDOCAINE 5 % EX PTCH: 1 | TRANSDERMAL | Stop: 2023-04-13 | NDC 00591352511

## 2023-04-09 MED ADMIN — HEPARIN (PORCINE) IN NACL 25000-0.45 UT/250ML-% IV SOLN: 850 [IU]/h | INTRAVENOUS | @ 08:00:00 | Stop: 2023-04-09

## 2023-04-09 MED ADMIN — DULOXETINE HCL 30 MG PO CPEP: 30 mg | ORAL | @ 18:00:00 | Stop: 2023-05-09 | NDC 60687073411

## 2023-04-09 MED ADMIN — ZZ IMS TEMPLATE: 150 mg | ORAL | @ 04:00:00 | Stop: 2023-04-11 | NDC 60687034911

## 2023-04-09 MED ADMIN — HYDROMORPHONE HCL 1 MG/ML IJ SOLN: .6 mg | INTRAVENOUS | @ 10:00:00 | Stop: 2023-04-13 | NDC 00409128331

## 2023-04-09 MED ADMIN — PROCHLORPERAZINE EDISYLATE 10 MG/2ML IJ SOLN: 5 mg | INTRAVENOUS | Stop: 2023-04-13 | NDC 23155029442

## 2023-04-09 MED ADMIN — HYDROMORPHONE HCL 1 MG/ML IJ SOLN: .6 mg | INTRAVENOUS | @ 01:00:00 | Stop: 2023-04-13 | NDC 00409128331

## 2023-04-09 MED ADMIN — OXYCODONE-ACETAMINOPHEN 5-325 MG PO TABS: 1 | ORAL | @ 18:00:00 | Stop: 2023-04-13 | NDC 00904709361

## 2023-04-09 MED ADMIN — FLUTICASONE PROPIONATE HFA 220 MCG/ACT IN AERO: 1 | RESPIRATORY_TRACT | @ 16:00:00 | Stop: 2023-04-13

## 2023-04-09 MED ADMIN — CHOLESTYRAMINE 4 G PO PACK: 4 g | ORAL | @ 23:00:00 | Stop: 2023-04-10

## 2023-04-09 MED ADMIN — HYDROMORPHONE HCL 1 MG/ML IJ SOLN: .6 mg | INTRAVENOUS | @ 15:00:00 | Stop: 2023-04-13 | NDC 00409128331

## 2023-04-09 MED ADMIN — HYDROMORPHONE HCL 1 MG/ML IJ SOLN: .6 mg | INTRAVENOUS | @ 19:00:00 | Stop: 2023-04-13 | NDC 00409128331

## 2023-04-09 NOTE — Other
Patient's Clinical Goal:   Clinical Goal(s) for the Shift: safety/comfort  Identify possible barriers to advancing the care plan: None  Stability of the patient: Moderately Unstable - medium risk of patient condition declining or worsening    Progression of Patient's Clinical Goal:     A/Ox4. BMAT 4. Asymptomatic hypotension. C/O upper abd pain, PRN IV Dilaudid Q 4 hours + PRN PO oxycodone-acetaminophen. OOB to void. NPO except ice chips and sips at MN. Heparin gtt continued at 850 units/hr, next Anti-xa 0700. No acute changes or signs of bleeding.

## 2023-04-09 NOTE — Consults
IP CM ACTIVE DISCHARGE PLANNING  Department of Care Coordination      Admit ZOXW:[id number]  Anticipated Date of Discharge:     Following MD:El-Okdi, Arletha Grippe., MD      Today's short update     DC 4/20 if tolerating diet and pain is controled.  Home with no anticipated CM needs.    Disposition     Home, No needs identified  [address]  [place] [address]                      Lowella Curb,  04/09/2023

## 2023-04-09 NOTE — Progress Notes
[facility] Hospitalist Progress Note  Patient: Donna Gross  MRN: [id number]  Date of Service: 04/09/2023  Hospital Day: 0  PCP: Kelle Darting, MD      Chief Complaint-ROA   Acute abdominal pain   Subjective:   Overnight events: VSS. Afebrile. Seen by IR wit no indication for intervention. Recommended terapeutic AC.     Subjective: Reports pain better controlled. States she is hungry, requesting diet. She is amenable to trying blendarized and advancing as tolerated. She denies N/V. Having regular BMs. No fevers/chills, chest pain, sob, cough. Voices no further complaints.     Review of Systems:  Extended review of systems negative except as per above.    Medications:  Scheduled Meds:   cholestyramine  4 g Oral TID    DULoxetine  30 mg Oral Daily    fluticasone  1 puff Inhalation Daily    fluticasone-salmeterol  1 puff Inhalation BID    lidocaine  1 patch Transdermal Q24H    nicotine patch  7 mg Transdermal Daily    pantoprazole  40 mg IV Push Q24H    QUEtiapine  150 mg Oral BID     Continuous Infusions:   heparin 25,000 units/250 mL drip 850 Units/hr (04/09/23 0045)    sodium chloride Stopped (04/08/23 1445)     PRN Meds:acetaminophen, albuterol, calcium carbonate, HYDROmorphone, ipratropium-albuterol, ondansetron **OR** ondansetron injection/IVPB, oxyCODONE-acetaminophen, pancrelipase (Lip-Prot-Amyl), prochlorperazine, traZODone      Objective:   Vital Signs:  Temp:  [36.4 ?C (97.5 ?F)-36.8 ?C (98.2 ?F)] 36.8 ?C (98.2 ?F)  Heart Rate:  [58-88] 61  Resp:  [12-22] 20  BP: (105-178)/(66-101) 134/83  NBP Mean:  [80-118] 95  SpO2:  [93 %-100 %] 100 %    04/18 0701 - 04/19 0700  In: 9 [I.V.:9]  Out: -     Physical Exam:  General: Pleasant female, alert and oriented x 3  HEENT: EOMI, clear oropharynx, moist mucous membranes  Neck: supple, no lymphadenoapthy, no JVD,   Heart: RRR, normal S1 and S2, no murmurs, rubs or gallops, nondisplaced PMI, no palpable thrills  Lungs: CTA bilaterally, no wheezes or rhonchi,   Abd: soft, nontender, mildly distended, normoactive bowel sounds, no organomegaly  Ext: no c/c/e, 2+ DP pulses  Skin: no rashes, discharge or erythema, normal skin turgor    Labs:  BMP  Recent Labs     04/09/23  0530 04/08/23  0805   NA 140 141   K 3.6 3.6   CL 107* 105   CO2 23 21   BUN 10 8   CREAT 0.95 0.86   CALCIUM 8.9 9.7   MG  --  1.4   PHOS  --  4.0     LFT  Recent Labs     04/08/23  0805   TOTPRO 7.4   ALBUMIN 4.5   BILITOT 0.2   ALT 8   AST 24   ALKPHOS 77   LIPASE 34     CBC  Recent Labs     04/09/23  0530 04/08/23  0805   WBC 8.54 6.72   HGB 9.3* 10.6*   HCT 30.3* 34.6*   MCV 87.1 86.9   PLT 350 418*     Coags  Recent Labs     04/08/23  1319   INR 1.0   PT 13.0   APTT 30.8       Microbiology: none    Studies: Independently reviewed by me.   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 Clinically correlate for mesenteric angina.  No direct CT findings of ischemic enteritis or colitis.    US duplex abd 04/09/2023:  IMPRESSION:  Patent celiac artery and superior mesenteric artery with borderline, approximately 50%, stenosis of the superior mesenteric artery at the origin.    TTE 02/02/2023:  CONCLUSIONS   1. Normal left ventricular size.   2. Normal LV regional wall motion.   3. LV systolic function is normal. LV ejection fraction is approximately 60 to 65%.   4. The calculated ejection fraction (Simpson's) is 66 %.   5. Normal LV diastolic function.   6. There is no significant valvular dysfunction.   7. Normal right ventricle in size. Normal RV systolic function.   8. Borderline elevated PA systolic pressure at 38 mmHg.   9. Normal right atrial pressure.  10. There is no pericardial effusion.  11. The proximal ascending aorta measures 32 mm (index 22 mm/m?).  12. Compared to prior study on 01/27/2020, there are no significant changes.    Impression & Plan:   Donna Gross is a 57 y.o. 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      #SMA Stenosis, POA Patient with known SMA stenosis sand symptomology c/w chronic mesenteric ischemia. 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 IR evaluated patient found patient would not benefit from endovascular intervention.   #Acute on Chronic Abdominal Pain, POA   -Appreciate IR Vascular Surgery consult and recs:   -start blenderized diet, ADAT mechanical soft   - s/p therapeutic heparin gtt given thrombosis on CTA within high-grade SMA stenosis; transition to eliquis 5 mg BID now  - Trend lactate q6h  -Pain Management with tylenol, norco, and IV dilaudid   -stop IVF   -IV PPI BID   -continue home hyoscyamine prn      #Diarrhea- POA, chronic  #hx of pancreatitis  #Severe protein calorie malnutrition, POA  C diff, H pylori, celiac studies, parasite enteric panel negative. EGD without acute source. Patient previously unable to tolerate colonoscopy. Previously seen by GI during recent hospitalization Outpatient colonoscopy, could consider SIBO testing outpatient.   - Tylenol/Oxy/Dilaudid and lidocaine patch for pain  - Consult to Nutrition Services   - Zofran 4 mg PRN 1st line, compazine 2nd line for nausea   - PPI BID  - resume home pancrelipase (creon) 36,000 u TIDAC while NPO  - resume home cholestyramine 4g TID once diet while NPO  - patient unable to tolerate prep for colonoscopy, will plan for outpatient colo, schedule on dc   - vascular surgery consulted for SMA narrowing as above    -can stop heparin gtt   -start eliquis 5 mg BID (4/19-)    -vascular follow up      #Tobacco Use, POA  - nicotine patch   - counseled about nicotine cessation      Chronic/stable:  #Anxiety  #Major Depression  #Fibromyalgia   #Insomnia  - Continue Seroquel 150mg  BID  - Continue duloxetine 40 mg qday   - Trazodone 50 mg at bedtime PRN       I have seen and examined the patient and agree with the RD assessment detailed below:     Patient meets criteria for: Severe protein calorie malnutrition    (current  weight 50.9 kg (112 lb 3.4 oz),  ;  ,  ).   See RD notes for additional details.    Diet: Diet NPO Reason for NPO: Procedure; Except for: Medications, Sips of water, Ice Chips  Code Status: Full Code    51 minutes were spent personally by me today on this encounter which include today's pre-visit review of the chart, obtaining appropriate history, performing an evaluation, documentation and discussion of management with details supported within the note for today's visit. The time documented was exclusive of any time spent on the separately billed procedure.           Signed: Arletha Grippe. El-Okdi 04/09/2023 8:48 PM

## 2023-04-09 NOTE — Nursing Note
Confirmed lactate drawn by phlebotomy.

## 2023-04-09 NOTE — Consults
INPATIENT CONSULTATION/ INTERVENTIONAL RADIOLOGY     PATIENT:  Donna Gross  MRN:  [id number]  DOB:  [birth year]  DATE OF SERVICE:  04/08/2023    REFERRING PHYSICIAN/ SERVICE:  Danny Lawless., MD    INTERVENTIONAL RADIOLOGY ATTENDING: Bryson Corona, MD   LOS: 0 days     ADMISSION DIAGNOSIS: Superior mesenteric artery stenosis (HCC/RAF) [K55.1]    REASON FOR CONSULTATION: chronic mesenteric ischemia    History obtained from interview with patient as well as review of available records in EMR.    HPI:  Donna Gross is a 57 y.o. female w PMHx COPD, colonic perforation s/p colostomy  s/p reversal, PUD c/b gastric outlet obstruction s/p multiple surgeries including laparscopic vagotomy, antrectomy and gastrojejunostomy (Dr. [name]), cholelithiasis, and gallstone pancreatitis who presents with acute on chronic abdominal pain.      Pt was seen by GI, EGD was done with no significant findings. Colonoscopy was planned but pt could not tolerate Bowel prep and was rescheduled to outpatient. Given the patient's post prandial pain, a CTA was ordered and found to have patent mesenteric vasculature with moderate narrowing of the superior mesenteric artery origin, and mild narrowing at the celiac origin. Seem by Vascular Surgery on 4/8 who felt her symptoms were consistent with chronic mesenteric ischemia given similar SMA plaques and narrowing on CTs since 04/2022.     Pertinent labs include:  WBC 6.7. Hgb 10.6. Plt 418. INR 1.0. Lactate 18. VS wnl.    ALLERGIES:   Allergies   Allergen Reactions    Hydrocodone Other (See Comments)     ''burns a hole in stomach''  Tolerates hydromorphone    Ibuprofen Other (See Comments)     GI discomfort    ''hurts my stomach''    Morphine Itching     Tolerates hydromorphone       MEDICATIONS:  Current Facility-Administered Medications   Medication Dose Route Frequency    acetaminophen tab 1,000 mg  1,000 mg Oral Q6H PRN    albuterol 90 mcg/act inh 2 puff  2 puff Inhalation Q6H PRN    calcium carbonate chew tab 500 mg  500 mg Oral Q4H PRN    [COMPLETED] cefOXitin 1 g IVPB MB ([facility] ONLY)  1 g Intravenous STAT    [START ON 04/09/2023] cholestyramine pwd packet 4 g  4 g Oral TID    [START ON 04/09/2023] DULoxetine DR cap 30 mg  30 mg Oral Daily    fluticasone 220 mcg/act inh 1 puff  1 puff Inhalation Daily    fluticasone-salmeterol 250-50 mcg/act diskus inhaler 1 puff  1 puff Inhalation BID    [COMPLETED] heparin 1000 unit/mL inj 4,100 Units  80 Units/kg Intravenous Once    heparin 25,000 units in 0.45% NaCl 250 mL drip RTU  900 Units/hr Intravenous Continuous    heparin per pharmacy   Does not apply Continuous    [COMPLETED] HYDROmorphone 1 mg/mL inj 0.5 mg  0.5 mg IV Push STAT    HYDROmorphone 1 mg/mL inj 0.6 mg  0.6 mg IV Push Q4H PRN    [COMPLETED] HYDROmorphone 1 mg/mL inj 1 mg  1 mg IV Push STAT    [COMPLETED] HYDROmorphone 1 mg/mL inj 1 mg  1 mg IV Push STAT    [COMPLETED] iohexol (Omnipaque) 350 mg/mL inj 100 mL  100 mL Intravenous Once    ipratropium-albuterol 20-100 mcg/act inh 1 puff  1 puff Inhalation Q6H PRN    lidocaine 5% patch 1 patch  1 patch  Transdermal Q24H    [COMPLETED] metroNIDAZOLE 500 mg in sodium chloride 100 mL IVPB RTU  500 mg Intravenous Once    nicotine 7 mg/24 hr patch 7 mg  7 mg Transdermal Daily    [COMPLETED] ondansetron 4 mg/2 mL inj 4 mg  4 mg Intravenous Once    [COMPLETED] ondansetron 4 mg/2 mL inj 4 mg  4 mg Intravenous Once    ondansetron tab 4 mg  4 mg Oral Q8H PRN    Or    ondansetron 4 mg/2 mL inj 4 mg  4 mg Intravenous Q8H PRN    oxyCODONE-acetaminophen 5-325 mg tab 1 tablet  1 tablet Oral Q4H PRN    pancrelipase (Lip-Prot-Amyl) (Creon) DR cap 36,000 units of lipase  36,000 units of lipase Oral TID PRN    pantoprazole inj 40 mg  40 mg IV Push Q24H    prochlorperazine 10 mg/2 mL inj 5 mg  5 mg IV Push Q6H PRN    QUEtiapine tab 150 mg  150 mg Oral BID    [COMPLETED] sodium chloride 0.9% IV soln bolus 1,000 mL  1,000 mL Intravenous STAT    sodium chloride 0.9% IV soln  10 mL/hr Intravenous Continuous    traZODone tab 50 mg  50 mg Oral QHS PRN     Current Outpatient Medications   Medication Sig    acetaminophen 500 mg tablet Take 2 tablets (1,000 mg total) by mouth every six (6) hours as needed for Pain.    albuterol 90 mcg/act inhaler Inhale 2 puffs every six (6) hours as needed for Wheezing or Shortness of Breath.    B Complex Vitamins (VITAMIN B COMPLEX) capsule Take 1 capsule by mouth daily.    cholestyramine 4 g/dose powder Take 1 packet (4 g total) by mouth three (3) times daily with meals.    clobetasol 0.05% cream Apply topically three (3) times daily as needed (rash).    cyanocobalamin 1000 mcg tablet Take 1 tablet (1,000 mcg total) by mouth daily.    DULoxetine 30 mg DR capsule Take 1 capsule (30 mg total) by mouth daily.    fluticasone 220 mcg/act inhaler Inhale 1 by mouth puff daily.    fluticasone-salmeterol 250-50 mcg/act diskus inhaler Inhale 1 puff two (2) times daily.    ipratropium-albuterol 20-100 mcg/act inhaler Inhale 1 puff every six (6) hours as needed for Wheezing or Shortness of Breath.    naloxone 4 mg/0.1 mL nasal spray Call 911. Administer a single spray intranasally into one nostril for opioid overdose. May repeat in 3 minutes if patient is not breathing..    ondansetron 4 mg tablet Take 1 tablet (4 mg total) by mouth every six (6) hours as needed for Nausea.    oxyCODONE-acetaminophen 7.5-325 MG per tablet Take 1 tablet by mouth every four (4) hours as needed.    pancrelipase, Lip-Prot-Amyl, (CREON) 36000 units DR capsule Take 1 capsule (36,000 units of lipase total) by mouth three (3) times daily with meals.    QUEtiapine Fumarate (SEROQUEL PO) Take 150 mg by mouth two (2) times daily.     PAST MEDICAL HISTORY:   Past Medical History:   Diagnosis Date    Anxiety     Depression     Emphysema, unspecified (HCC/RAF)     Emphysema, unspecified (HCC/RAF)     Fibromyalgia     Gastritis     GERD (gastroesophageal reflux disease)     History of blood transfusion Intractable nausea and vomiting 01/23/2020  Pancreatitis      PAST SURGICAL HISTORY:  Past Surgical History:   Procedure Laterality Date    ABDOMINAL SURGERY      COLON SURGERY       FAMILY HISTORY:  Family History   Problem Relation Age of Onset    Heart disease Mother     Diabetes Brother     Diabetes Father     Anesthesia problems Neg Hx     Malignant hypertension Neg Hx     Hypotension Neg Hx     Malignant hyperthermia Neg Hx     Pseudochol deficiency Neg Hx      SOCIAL HISTORY:  Social History     Tobacco Use    Smoking status: Every Day     Years: 20     Types: Cigarettes    Smokeless tobacco: Current    Tobacco comments:     1 cig per day   Substance Use Topics    Alcohol use: No    Drug use: No     REVIEW OF SYSTEMS:   A complete review of 14 systems was performed. Additional symptoms were otherwise negative and/or non-contributory, except as listed above     PHYSICAL EXAM:  Vital Signs:  Temp:  [36.6 ?C (97.9 ?F)] 36.6 ?C (97.9 ?F)  Heart Rate:  [58-75] 60  Resp:  [12-23] 15  BP: (115-178)/(66-104) 169/84  NBP Mean:  [86-119] 106  SpO2:  [93 %-99 %] 93 %  Vitals:    04/08/23 0703   Weight: 50.9 kg (112 lb 3.4 oz)      GENERAL: Comfortable, NAD.  HEENT: Face symmetric. Clear oropharynx.  CARDIOVASCULAR: Regular rate and rhythm, no ectopy on monitor.  PUMONARY: On RA  - Normal respiratory excursions, not tachypneic, no labored breathing.  ABD: Non-distended, soft, non-tender.  GU: Voiding  EXT: WWP, no c/c/e  NEURO: A&Ox3, moving all ext., Follows commands  CENTRAL LINE: None  DRAINS: None    DETAILED I&O:  No intake/output data recorded.  No intake or output data in the 24 hours ending 04/08/23 1832     LAB REVIEW:    WBC/Hgb/Hct/Plts:  6.72/10.6/34.6/418 (04/18 0805)   Na/K/Cl/CO2/BUN/Cr/glu:  141/3.6/105/21/8/0.86/86 (04/18 0805)   PT/INR/APTT/Fib:  13.0/1.0/30.8/-- (04/18 1319)   AST/ALT/Bili T/Alk Phos/Prot/Alb:  24/8/0.2/77/7.4/4.5 (04/18 0805)    Recent Results (from the past 24 hour(s))   ED INFORMATION EXCHANGE Ordered by an unspecified provider    Collection Time: 04/08/23  6:58 AM   Result Value Ref Range    Emer. Dept. Info Exchange - Care Plan      Emer. Dept. Engineer, mining. Dept. Info Exchange - 30 day Visit Count 1     Emer. Dept. Info Exchange - 180 day Visit Count 3    ECG 12 lead    Collection Time: 04/08/23  7:34 AM   Result Value Ref Range    Ventricular Rate 64 BPM    Atrial Rate 64 BPM    P-R Interval 112 ms    QRS Duration 85 ms    Q-T Interval 420 ms    QTC Calculation (Bezet) 434 ms    P Axis 83 degrees    R Axis 69 degrees    T Axis 81 degrees    Diagnosis Sinus rhythm     Diagnosis Borderline short PR interval     Diagnosis Abnormal R-wave progression, early transition     Diagnosis 12 Lead; Mason-Likar  Diagnosis Otherwise Normal ECG    Comprehensive Metabolic Panel    Collection Time: 04/08/23  8:05 AM   Result Value Ref Range    Sodium 141 135 - 146 mmol/L    Potassium 3.6 3.6 - 5.3 mmol/L    Chloride 105 96 - 106 mmol/L    Total CO2 21 20 - 30 mmol/L    Anion Gap 15 8 - 19 mmol/L    Glucose 86 65 - 99 mg/dL    Creatinine 5.95 6.38 - 1.30 mg/dL    Estimated GFR 78 See GFR Additional Information mL/min/1.26m2    GFR Additional Information See Comment     Urea Nitrogen 8 7 - 22 mg/dL    Calcium 9.7 8.6 - 75.6 mg/dL    Total Protein 7.4 6.1 - 8.2 g/dL    Albumin 4.5 3.9 - 5.0 g/dL    Bilirubin,Total 0.2 0.1 - 1.2 mg/dL    Alkaline Phosphatase 77 37 - 113 U/L    Aspartate Aminotransferase 24 13 - 62 U/L    Alanine Aminotransferase 8 8 - 70 U/L   Lipase    Collection Time: 04/08/23  8:05 AM   Result Value Ref Range    Lipase 34 13 - 69 U/L   Lactate    Collection Time: 04/08/23  8:05 AM   Result Value Ref Range    Blood Lactate 18 5 - 18 mg/dL   CBC    Collection Time: 04/08/23  8:05 AM   Result Value Ref Range    White Blood Cell Count 6.72 4.16 - 9.95 x10E3/uL    Red Blood Cell Count 3.98 3.96 - 5.09 x10E6/uL    Hemoglobin 10.6 (L) 11.6 - 15.2 g/dL Hematocrit 43.3 (L) 29.5 - 45.2 %    Mean Corpuscular Volume 86.9 79.3 - 98.6 fL    Mean Corpuscular Hemoglobin 26.6 26.4 - 33.4 pg    MCH Concentration 30.6 (L) 31.5 - 35.5 g/dL    Red Cell Distribution Width-SD 51.2 (H) 36.9 - 48.3 fL    Red Cell Distribution Width-CV 15.9 (H) 11.1 - 15.5 %    Platelet Count, Auto 418 (H) 143 - 398 x10E3/uL    Mean Platelet Volume 8.4 (L) 9.3 - 13.0 fL    Nucleated RBC%, automated 0.0 No Ref. Range %    Absolute Nucleated RBC Count 0.00 0.00 - 0.00 x10E3/uL    Neutrophil Abs (Prelim) 4.24 See Absolute Neut Ct. x10E3/uL   Differential, Automated    Collection Time: 04/08/23  8:05 AM   Result Value Ref Range    Neutrophil Percent, Auto 63.1 No Ref. Range %    Lymphocyte Percent, Auto 30.7 No Ref. Range %    Monocyte Percent, Auto 4.0 No Ref. Range %    Eosinophil Percent, Auto 1.3 No Ref. Range %    Basophil Percent, Auto 0.6 No Ref. Range %    Immature Granulocytes% 0.3 No Reference Range %    Absolute Neut Count 4.24 1.80 - 6.90 x10E3/uL    Absolute Lymphocyte Count 2.06 1.30 - 3.40 x10E3/uL    Absolute Mono Count 0.27 0.20 - 0.80 x10E3/uL    Absolute Eos Count 0.09 0.00 - 0.50 x10E3/uL    Absolute Baso Count 0.04 0.00 - 0.10 x10E3/uL    Absolute Immature Gran Count 0.02 0.00 - 0.04 x10E3/uL   Magnesium    Collection Time: 04/08/23  8:05 AM   Result Value Ref Range    Magnesium 1.4 1.4 - 1.9 mEq/L   Phosphorus  Collection Time: 04/08/23  8:05 AM   Result Value Ref Range    Phosphorus 4.0 2.3 - 4.4 mg/dL   UA,Dipstick    Collection Time: 04/08/23 11:06 AM    Specimen: Clean Catch, Midstream; Urine   Result Value Ref Range    Urine Color Colorless      Specific Gravity 1.040 (H) 1.005 - 1.030    pH,Urine 6.5 5.0 - 8.0    Blood Negative Negative    Bilirubin Negative Negative    Ketones Negative Negative    Glucose Negative Negative    Protein Negative Negative    Leukocyte Esterase Negative Negative    Nitrite Negative Negative   UA,Microscopic    Collection Time: 04/08/23 11:06 AM    Specimen: Clean Catch, Midstream; Urine   Result Value Ref Range    RBC per uL 0 0 - 11 cells/uL    WBC per uL 2 0 - 22 cells/uL    RBC per HPF 0 0 - 2 cells/HPF    WBC per HPF 0 0 - 4 cells/HPF    Squamous Epi Cells 3 0 - 17 cells/uL   Unfractionated Heparin, by anti-Xa    Collection Time: 04/08/23  1:19 PM   Result Value Ref Range    Anti-Xa, UFH <0.10   IU/mL   APTT    Collection Time: 04/08/23  1:19 PM   Result Value Ref Range    APTT 30.8 24.4 - 36.2 seconds   Prothrombin Time Panel    Collection Time: 04/08/23  1:19 PM   Result Value Ref Range    Prothrombin Time 13.0 11.5 - 14.4 seconds    INR 1.0 .   Lactate    Collection Time: 04/08/23  3:25 PM   Result Value Ref Range    Blood Lactate 8 5 - 18 mg/dL     Absolute Neut Count   Date/Time Value Ref Range Status   04/08/2023 08:05 AM 4.24 1.80 - 6.90 x10E3/uL Final   11/24/2022 06:41 AM 4.94 1.80 - 6.90 x10E3/uL Final   07/23/2022 07:56 AM 5.14 1.80 - 6.90 x10E3/uL Final   05/18/2022 08:33 AM 2.85 1.80 - 6.90 x10E3/uL Final   05/03/2022 05:26 AM 2.66 1.80 - 6.90 x10E3/uL Final   05/02/2022 10:45 AM 4.18 1.80 - 6.90 x10E3/uL Final     Absolute Neutrophil Count (Outside Lab)   Date/Time Value Ref Range Status   03/09/2021 12:00 AM 5.2  Final     MICRO:  No results found for this or any previous visit (from the past 168 hour(s)).    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 Clinically correlate for mesenteric angina.  No direct CT findings of ischemic enteritis or colitis.     ASSESSMENT:  Donna Gross is a 57 y.o. female w PMHx COPD, colonic perforation s/p colostomy  s/p reversal, PUD c/b gastric outlet obstruction s/p multiple surgeries including laparscopic vagotomy, antrectomy and gastrojejunostomy (Dr. [name]), cholelithiasis, and gallstone pancreatitis who presents with acute on chronic abdominal pain.      Pt was seen by GI, EGD was done with no significant findings. Colonoscopy was planned but pt could not tolerate Bowel prep and was rescheduled to outpatient. Given the patient's post prandial pain, a CTA was ordered and found to have patent mesenteric vasculature with moderate narrowing of the superior mesenteric artery origin, and mild narrowing at the celiac origin. Seem by Vascular Surgery on 4/8  who felt her symptoms were consistent with chronic mesenteric ischemia given similar SMA plaques and narrowing on CTs since 04/2022.     CTA 4/18 with near circumferential atheromatous plaque at the origin of the SMA. The central lumen of the vessel is thrombosed. There is a thin rim of residual contrast flow at the perimeter of the vessel where it is not calcified. The residual lumen measures between 0.9 and 1.4 mm, in keeping with high-grade stenosis, similar in degree to prior examination. There is no intimal dissection. Celiac artery is patent. IMA is patent.    We recommend the following:   - SMA narrowing is similar to prior studies. Given normal size and patency of the IMA and CA, we are not convinced the SMA narrowing is responsible for the patient's abdominal pain, and therefore stenting would not be beneficial.   - Recommend transabdominal US to identify stenosis and post-stenotic turbulence  - No indication for urgent intervention given normal exam/labs  - We will continue to follow      The patient was discussed with the Interventional Radiology Attending who agrees with assessment and plans listed above.    Medical decision making: High complexity, High risk     I spent 75 minutes evaluating this patients [place] records and available outside medical records, this includes review of pertinent labs, micro data, and imaging, discussing the plan of care with the IR attending, with the primary team. During the consultation, I reviewed pertinent medical history, performed a focused physical exam, reviewed imaging with the patient, and discussed risks, benefits, and alternatives of possible SMA stenting.     Author:  Stefano Gaul  6:32 PM 04/08/2023  Interventional Radiology  [facility]

## 2023-04-09 NOTE — Progress Notes
Pharmaceutical Services - Heparin Consult Management Note    Objective Data   Allergies: Hydrocodone, Ibuprofen, and Morphine  Height:   Most recent documented height   03/29/23 1.626 m (5' 4'')     Actual Weight:   Most recent documented weight   04/08/23 50.9 kg       Heparin drip administrations (last 48 hours)       Date/Time Action Medication Dose Rate    04/09/23 0045 Rate/Dose Change*    heparin 25,000 units in 0.45% NaCl 250 mL drip RTU 850 Units/hr 8.5 mL/hr    04/08/23 1519 New Bag/ Syringe/ Cartridge    heparin 25,000 units in 0.45% NaCl 250 mL drip RTU 900 Units/hr 9 mL/hr            Current Labs  Recent Labs     04/08/23  1319   APTT 30.8     Recent Labs     04/09/23  0709   HEPANTIXAUFH 0.53     Recent Labs     04/09/23  0530   WBC 8.54   HGB 9.3*   HCT 30.3*   MCV 87.1   PLT 350       INR   Date Value Ref Range Status   04/08/2023 1.0 . Final     Comment:     Therapeutic Range: INR 2-3  Mechanical Valves: INR 2.5-3.5       INR (Outside Lab)   Date Value Ref Range Status   03/09/2021 0.90  Final     Bilirubin,Total   Date Value Ref Range Status   04/08/2023 0.2 0.1 - 1.2 mg/dL Final       Assessment:  Protocol: Regular Intensity: Anti-Xa goal 0.3-0.7 or aPTT goal 66-108  Anti-Xa aPTT Re-bolus if Ordered  (NTE 5,000 units) /  Hold Infusion Rate Change Check Anti-Xa/aPTT   <0.1 <46 50 units/kg Increase by 4 units/kg/hr 6 hours after rate change   0.1-0.19 46-55.9 25 units/kg Increase by 3 units/kg/hr    0.2-0.29 56-65.9 - Increase by 2 units/kg/hr    0.3-0.7 66-108 Continue current rate 6 hours;  If therapeutic x2, recheck qAM   0.71-0.8 108.1-118 - Decrease by 1 unit/kg/hr 6 hours after rate change   0.81-0.9 118.1-128 - Decrease by 2 units/kg/hr    0.91-1 128.1-140 Hold for 1 hour Decrease by 3 units/kg/hr    >1 >140 Hold infusion and repeat STAT anti-Xa/aPTT in 1 hour.  If anti-Xa ? 1 or aPTT ? 140, restart infusion and decrease by 4 units/kg/hr. Repeat anti-Xa/aPTT 6 hours after rate change.  If anti-Xa > 1 or aPTT > 140, continue to hold and repeat anti-Xa/aPTT in 1 hour. Notify provider.       Indication: arterial thrombosis  Goal: anti-Xa 0.3-0.7  Dosing weight: 50kg  Concurrent warfarin: No  Concomitant medications that may contribute to bleeding: duloxetine   Pending invasive procedures: none   Signs and symptoms of bleeding/bruising within 24 hours: No RN confirmed no s/sx of bleeding       Plan:  Donna Gross is a 57 y.o. female who has been referred to pharmacy for therapeutic heparin management.     Bolus: No bolus at this time  Infusion: Continue at 850 units/hr    Next anti-Xa/aPTT ordered at 1300   Plan was communicated to RN.  Pharmacy will continue to monitor the patient?s clinical progress and communicate any changes with treatment team.  For questions about this patient's heparin therapy, please call [phone] [facility])  or [phone] [facility]    Tasia Catchings, PharmD, 04/09/2023, 8:20 AM

## 2023-04-09 NOTE — Consults
NUTRITION ASSESSMENT (Adult)    Admit Date:      Date of Birth: [birth year] Gender: female MRN: [id number]     Date of Assessment: 04/09/2023    Consult   Indication: Malnutrition, Significant weight loss per AND/ASPEN guidelines   Subjective: Visited pt in the ED. Pt reports good appetite at home, eating three meals per day on average. However, pt also reports GI-related issued including nausea, vomiting, and diarrhea. Per pt, she has loose stools immediately after eating, regardless of her meals consist of. Unclear time frame as to how long these symptoms have been going. Pt states uses pancreatic enzyme at home. Currently endorsing hunger. PT amenable to Boost Breeze    Problems: Principal Problem:    Superior mesenteric artery stenosis (HCC/RAF) (POA: Yes)       Past Medical History:   Diagnosis Date    Anxiety     Depression     Emphysema, unspecified (HCC/RAF)     Emphysema, unspecified (HCC/RAF)     Fibromyalgia     Gastritis     GERD (gastroesophageal reflux disease)     History of blood transfusion     Intractable nausea and vomiting 01/23/2020    Pancreatitis     Past Surgical History:   Procedure Laterality Date    ABDOMINAL SURGERY      COLON SURGERY            Per H&P/MD notes: Donna Gross is a 57 y.o. female with a history of anxiety, depression, emphysema, fibromyalgia, gastritis, GERD, h/o perforated duodenal ulcer and B2 reconstruction (7/1/202),  and h/o biliary pancreatitis s/p lap chole w/ extensive adhesiolysis (03/2022) who is presenting with complaints of abdominal pain, nausea, poor oral intake and diarrhea.      Patient reports worsening upper abdominal pain and abdominal swelling over the past week. She reports also chronic diarrhea, intermittent nausea, and poor oral intake since her cholecystectomy on 4/23 during which time a biliary stent was place which was subsequently removed 05/24 without incident. Patient reports ongoing, worsening abdominal pain with associated poor PO intake and intermittent diarrhea since that time cholecystectomy she has required three hospital admissions (2/24, 07/23, 12/23) for ongoing symptoms. She also reports 50lb weight loss over past year.      Data   Intake/Outputs: I/O last 2 completed shifts:  In: 9 [I.V.:9]  Out: -      Pertinent Medications: cholestyramine, 4 g, Oral, TID  DULoxetine, 30 mg, Oral, Daily  fluticasone, 1 puff, Inhalation, Daily  fluticasone-salmeterol, 1 puff, Inhalation, BID  lidocaine, 1 patch, Transdermal, Q24H  nicotine patch, 7 mg, Transdermal, Daily  pantoprazole, 40 mg, IV Push, Q24H  potassium chloride IV (peripheral), 40 mEq, Intravenous, Once  QUEtiapine, 150 mg, Oral, BID    FDI Target Drugs: No      Pertinent Labs:   Recent Labs     04/09/23  0530 04/08/23  0805   NA 140 141   K 3.6 3.6   BUN 10 8   CREAT 0.95 0.86   GLUCOSE 81 86   MG 1.3* 1.4   CALCIUM 8.9 9.7   PHOS  --  4.0   ALBUMIN  --  4.5   WBC 8.54 6.72   HGB 9.3* 10.6*   HCT 30.3* 34.6*   BILITOT  --  0.2   AST  --  24   ALT  --  8   ALKPHOS  --  77   LIPASE  --  34  Trended Labs     01/28/23  0646 04/22/22  0852   CRP  --  0.5   TRIGLY 93  --         No results for input(s): ''VITD25OH'', ''PTHINT'', ''VITAMINA'', ''VITAMINE'', ''VITAMINC'', ''VITAMINK'', ''VITAMINB1'', ''VITAMINB2'', ''VITAMINB3'', ''VITAMINB6'', ''VITAMINB12'', ''MEMALSER'', ''FOLATE'', ''FOLATERBC'', ''FE'', ''TIBC'', ''FEBINDSAT'', ''FERRITIN'', ''SELENIUM'', ''CUSER'', ''CURBC'', ''ZINCSER'' in the last 8760 hours.    Accu-Chek: No results found in last 72 hours    Respiratory Status / O2 Device: None (Room air)    Pressure Injury: none recorded    Additional data:  N/A     Diet Info   Allergies:   Hydrocodone, Ibuprofen, and Morphine  Cultural/Ethnic/Religious/Other Food Preferences:  None     Nutrition prior to admit:  Regular foods, no special diet followed, three meals per day  Current diet order:     Diets/Supplements/Feeds   Diet    Diet blenderized liquid     Start Date/Time: 04/09/23 1240      Number of Occurrences: Until Specified PO % consumed:  (No intake documented yet)  Parenteral/Enteral Nutrition: none     Anthropometrics:   Height: 162.6 cm (5' 4'')  Admit Weight: 50.9 kg (112 lb 3.4 oz) (04/08/23 0703)  Current Weight: 50.9 kg (112 lb 3.4 oz)  BMI: 19.26  IBW: 54 kg/120 lbs  IBW %: 94  Adj BW (if applicable): 53 kg  UBW: 65.8 kg (145 lb) (reported by pt)  UBW %: 77 %       Weight History (last 5)  Weights Weight   01/25/2023   6:39 AM 46.1 kg   01/26/2023   6:25 PM 45.8 kg   01/28/2023   3:00 PM 45.4 kg   03/29/2023   7:51 AM 50.7 kg   04/08/2023   7:03 AM 50.9 kg       Estimated Nutrition Needs   Based on wt: 54.5 kg  Calories: 30 - 35 cal/kg = 1635 - 1907.5 cal/day  Protein: 1.2 - 1.5 g pro/kg = 65.4 - 81.75 g pro/day      Diet Education   No diet education needs at this time         Malnutrition Assessment using AND/ASPEN Consensus Criteria   Malnutrition in the Context of: Mild-moderate inflammation/chronic disease   Energy Intake: Does not meet criterion  Weight Loss: 20% in 1 year; Supportive data: There has been a 29lb (20.5%) weight loss over the last year from 03/2022-03/2023    Nutrition-Focused Physical Exam: 04/09/2023  Subcutaneous Fat Loss: Moderate  Areas examined/observed: Orbital Upper  Muscle Loss: Severe  Areas examined/observed: Temple, Clavicle, Scapula       Patient meets criteria for: Severe protein calorie malnutrition     Nutrition Assessment    Anthropometrics: Normal BMI given current weight and height. Of note, there has been an increase in weight over the last 20 months. However, upon review of weight, pt has still lost >20% of UBW.     Nutrition: Pt was NPO during visit but has now transitioned to a blenderized diet. Pt denies following any special diet at home. Per pt, she eats three meals per day and does not take any vitamin or mineral supplements. Pt requesting Boost Breeze.     Tolerance/GI: Diarrhea, nausea and emesis reported prior to admission. Chart review indicating Mesentary artery stenosis.     Labs: Low Mg    Micronutrients: No recent labs     Skin: No pressure injuries      Recommendations /  Care Plan      Continue with ordered diet: Blenderized Liquid     - advance to regular as soon as medically feasible     2. RD to send Boost Breeze TID     3. Vitamin and mineral supplementation    - MVI w/ minerals once PO daily     4. Check vitamin B1, B12, zinc, and vitamin D    - if B1 low, consider IV thiamine 300 mg x3-5 days, then 100 mg thiamine PO x 1 month     5. Monitor I/O, labs/lytes, and weekly weights        Author:  Jeni Salles, RD pager [phone]   04/09/2023 12:50 PM       Paged Attending  RD to monitor and follow-up per nutrition protocol

## 2023-04-09 NOTE — Progress Notes
Pharmaceutical Services - Medication Reconciliation Note - ED    Patient Name: Donna Gross  Medical Record Number: [id number]  Admit date:      Age: 57 y.o.  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    PTA Medication List (discrepancies are noted)   Prior to Admission medications as of 04/09/23 1554   Medication Sig Notes Last Dose   albuterol (2.5 mg/27mL) 0.083% nebulizer solution 2.5 mg, Nebulization, 3 times daily PRN   04/07/2023   albuterol 90 mcg/act inhaler 2 puffs, Inhalation, Every 6 hours PRN   04/08/2023   B Complex Vitamins (VITAMIN B COMPLEX) capsule 1 capsule, Oral, Daily   Past Week   Cholecalciferol (VITAMIN D3) 50 mcg (2000 units) TABS 50 mcg, Oral, Daily   Past Week   cholestyramine 4 g/dose powder 4 g, Oral, 3 times daily with meals   Past Week   clobetasol 0.05% cream Topical, 3 times daily PRN      cyanocobalamin 1000 mcg tablet 1,000 mcg, Oral, Daily   Past Week   DULoxetine 30 mg DR capsule 30 mg, Oral, Every other day   Past Week   fluticasone-salmeterol 250-50 mcg/act diskus inhaler 1 puff, Inhalation, 2 times daily   Past Week   hyoscyamine 0.125 mg tablet 125 mcg, Oral, Every 4 hours PRN   04/08/2023   ipratropium-albuterol 20-100 mcg/act inhaler 2 puffs, Inhalation, 4 times daily PRN   Past Week   naloxone 4 mg/0.1 mL nasal spray Call 911. Administer a single spray intranasally into one nostril for opioid overdose. May repeat in 3 minutes if patient is not breathing.      ondansetron ODT 4 mg disintegrating tablet 4 mg, Oral, Every 6 hours PRN   04/08/2023   oxyCODONE-acetaminophen 7.5-325 MG per tablet 1 tablet, Oral, 4 times daily PRN   04/08/2023   pancrelipase, Lip-Prot-Amyl, (CREON) 36000 units DR capsule 36,000 units of lipase, Oral, 3 times daily with meals   Past Week   pantoprazole 40 mg DR tablet 40 mg, Oral, Daily   Past Week   QUEtiapine Fumarate (SEROQUEL PO) 150 mg, Oral, 2 times daily   Past Week   ranolazine 500 mg 12 hr tablet 500 mg, Oral, 2 times daily   Past Week     The patient's allergies and medications have been reviewed and updated.       Alvino Blood, 04/09/2023, 3:11 PM  -------------------------------------------------------------------------------------------------------------------  I have reviewed the medication list compiled by the medication reconciliation pharmacy technician and agree with the findings.      Reconciliation  The assessment and reconciliation of admission  and inpatient orders with PTA medication list is complete. There are no issues requiring follow up at this time.     This note represents our good faith effort to obtain the best possible medication history from all attainable sources at the time of reconciliation    Darrik Richman PharmD, APh, BCPS  Transitions of Care Pharmacist  [phone]

## 2023-04-09 NOTE — Progress Notes
Pt care taken over at 1830, VSS.

## 2023-04-09 NOTE — Progress Notes
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 provider found  PRIMARY CARE PROVIDER: Kelle Darting, MD    Reason for Consultation: SMA stenosis    Chief Complaint:   Chief Complaint   Patient presents with    Abdominal Pain     Pt reports abdominal swelling and pain, different than her normal chronic abd pain. Pt c/o nausea and diarrhea, but states this has been ongoing since gallbladder surgery last year.        HPI: Hagan Maltz is a 57 y.o. female who presents for 1 week of worsening abdominal bloating and pain. She has a known SMA stenosis with chronic symptoms (~1 yr) of post-prandial emesis (1-2x per week) of undigested food, abdominal discomfort, and post-prandial diarrhea. Patient reports symptoms began 1 year ago ''after surgery''. 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 Most recent operation was lap chole in 03/2022 c/b subhepatic abscess requiring IR drainage and bile leak requiring ERCP/CBD stent.    Reporting 1 week of upper abdominal distension and worsening intermittent colicky abdominal pain. Chronic nausea and emesis of undigested foods, 1-2x per week, not worsened recently. Chronic diarrhea is also stable.    Interval History:  4/19: Persistent abdominal pain, managed with PRN medications. Evaluated for possible SMA stent or thrombolysis by IR and deemed not a candidate. AF VSS.    Past Medical History    Past Medical History:   Diagnosis Date    Anxiety     Depression     Emphysema, unspecified (HCC/RAF)     Emphysema, unspecified (HCC/RAF) Fibromyalgia     Gastritis     GERD (gastroesophageal reflux disease)     History of blood transfusion     Intractable nausea and vomiting 01/23/2020    Pancreatitis        Past Surgical History    Past Surgical History:   Procedure Laterality Date    ABDOMINAL SURGERY      COLON SURGERY         Family History    Family History   Problem Relation Age of Onset    Heart disease Mother     Diabetes Brother     Diabetes Father     Anesthesia problems Neg Hx     Malignant hypertension Neg Hx     Hypotension Neg Hx     Malignant hyperthermia Neg Hx     Pseudochol deficiency Neg Hx        Social History    Social History     Socioeconomic History    Marital status: Single   Tobacco Use    Smoking status: Every Day     Years: 20     Types: Cigarettes    Smokeless tobacco: Current    Tobacco comments:     1 cig per day   Substance and Sexual Activity    Alcohol use: No    Drug use: No   Social History Narrative    Lives w/ mom and daughter     Social Determinants of Health  Financial Resource Strain: Low Risk  (01/29/2023)    Financial Resource Strain     Difficulty of Paying Living Expenses: Not very hard       Medications    Current Facility-Administered Medications   Medication Dose Route Frequency    acetaminophen tab 1,000 mg  1,000 mg Oral Q6H PRN    albuterol 90 mcg/act inh 2 puff  2 puff Inhalation Q6H PRN    calcium carbonate chew tab 500 mg  500 mg Oral Q4H PRN    [COMPLETED] cefOXitin 1 g IVPB MB ([facility] ONLY)  1 g Intravenous STAT    cholestyramine pwd packet 4 g  4 g Oral TID    DULoxetine DR cap 30 mg  30 mg Oral Daily    fluticasone 220 mcg/act inh 1 puff  1 puff Inhalation Daily    fluticasone-salmeterol 250-50 mcg/act diskus inhaler 1 puff  1 puff Inhalation BID    [COMPLETED] heparin 1000 unit/mL inj 4,100 Units  80 Units/kg Intravenous Once    heparin 25,000 units in 0.45% NaCl 250 mL drip RTU  850 Units/hr Intravenous Continuous    heparin per pharmacy   Does not apply Continuous    [COMPLETED] HYDROmorphone 1 mg/mL inj 0.5 mg  0.5 mg IV Push STAT    HYDROmorphone 1 mg/mL inj 0.6 mg  0.6 mg IV Push Q4H PRN    [COMPLETED] HYDROmorphone 1 mg/mL inj 1 mg  1 mg IV Push STAT    [COMPLETED] HYDROmorphone 1 mg/mL inj 1 mg  1 mg IV Push STAT    [COMPLETED] iohexol (Omnipaque) 350 mg/mL inj 100 mL  100 mL Intravenous Once    ipratropium-albuterol 20-100 mcg/act inh 1 puff  1 puff Inhalation Q6H PRN    lidocaine 5% patch 1 patch  1 patch Transdermal Q24H    [COMPLETED] metroNIDAZOLE 500 mg in sodium chloride 100 mL IVPB RTU  500 mg Intravenous Once    nicotine 7 mg/24 hr patch 7 mg  7 mg Transdermal Daily    [COMPLETED] ondansetron 4 mg/2 mL inj 4 mg  4 mg Intravenous Once    [COMPLETED] ondansetron 4 mg/2 mL inj 4 mg  4 mg Intravenous Once    ondansetron tab 4 mg  4 mg Oral Q8H PRN    Or    ondansetron 4 mg/2 mL inj 4 mg  4 mg Intravenous Q8H PRN    oxyCODONE-acetaminophen 5-325 mg tab 1 tablet  1 tablet Oral Q4H PRN    pancrelipase (Lip-Prot-Amyl) (Creon) DR cap 36,000 units of lipase  36,000 units of lipase Oral TID PRN    pantoprazole inj 40 mg  40 mg IV Push Q24H    [COMPLETED] potassium chloride 40 mEq in sodium chloride 0.9% 500 mL IVPB  40 mEq Intravenous Once    prochlorperazine 10 mg/2 mL inj 5 mg  5 mg IV Push Q6H PRN    QUEtiapine tab 150 mg  150 mg Oral BID    [COMPLETED] sodium chloride 0.9% IV soln bolus 1,000 mL  1,000 mL Intravenous STAT    sodium chloride 0.9% IV soln  10 mL/hr Intravenous Continuous    traZODone tab 50 mg  50 mg Oral QHS PRN     Current Outpatient Medications   Medication Sig    albuterol (2.5 mg/67mL) 0.083% nebulizer solution Take 3 mLs (2.5 mg total) by nebulization three (3) times daily as needed for Shortness of Breath.    albuterol 90 mcg/act inhaler Inhale 2 puffs every  six (6) hours as needed for Wheezing or Shortness of Breath.    B Complex Vitamins (VITAMIN B COMPLEX) capsule Take 1 capsule by mouth daily.    Cholecalciferol (VITAMIN D3) 50 mcg (2000 units) TABS Take 1 tablet (50 mcg total) by mouth daily.    cholestyramine 4 g/dose powder Take 1 packet (4 g total) by mouth three (3) times daily with meals.    clobetasol 0.05% cream Apply topically three (3) times daily as needed (rash).    cyanocobalamin 1000 mcg tablet Take 1 tablet (1,000 mcg total) by mouth daily.    DULoxetine 30 mg DR capsule Take 1 capsule (30 mg total) by mouth every other day.    fluticasone-salmeterol 250-50 mcg/act diskus inhaler Inhale 1 puff two (2) times daily.    hyoscyamine 0.125 mg tablet Take 1 tablet (125 mcg total) by mouth every four (4) hours as needed for Cramping.    ipratropium-albuterol 20-100 mcg/act inhaler Inhale 2 puffs four (4) times daily as needed for Wheezing or Shortness of Breath.    naloxone 4 mg/0.1 mL nasal spray Call 911. Administer a single spray intranasally into one nostril for opioid overdose. May repeat in 3 minutes if patient is not breathing..    ondansetron ODT 4 mg disintegrating tablet Take 1 tablet (4 mg total) by mouth every six (6) hours as needed for Nausea or Vomiting.    oxyCODONE-acetaminophen 7.5-325 MG per tablet Take 1 tablet by mouth four (4) times daily as needed for Pain.    pancrelipase, Lip-Prot-Amyl, (CREON) 36000 units DR capsule Take 1 capsule (36,000 units of lipase total) by mouth three (3) times daily with meals.    pantoprazole 40 mg DR tablet Take 1 tablet (40 mg total) by mouth daily.    QUEtiapine Fumarate (SEROQUEL PO) Take 150 mg by mouth two (2) times daily.    ranolazine 500 mg 12 hr tablet Take 1 tablet (500 mg total) by mouth two (2) times daily.    [DISCONTINUED] acetaminophen 500 mg tablet Take 2 tablets (1,000 mg total) by mouth every six (6) hours as needed for Pain. (Patient not taking: Reported on 04/09/2023.)    [DISCONTINUED] fluticasone 220 mcg/act inhaler Inhale 1 by mouth puff daily. (Patient not taking: Reported on 04/09/2023.)    [DISCONTINUED] ondansetron 4 mg tablet Take 1 tablet (4 mg total) by mouth every six (6) hours as needed for Nausea. (Patient not taking: Reported on 04/09/2023.)       Allergies    Hydrocodone, Ibuprofen, and Morphine      Review of Systems:  A full 14 point review of systems was performed and is negative except as above in the HPI.    Physical Exam:      Vitals:BP 127/89  ~ Pulse 82  ~ Temp 36.7 ?C (98.1 ?F) (Oral)  ~ Resp 21  ~ Wt 112 lb 3.4 oz (50.9 kg)  ~ SpO2 98%  ~ BMI 19.26 kg/m?  Body mass index is 19.26 kg/m?Marland Kitchen    General: NAD, alert and oriented x3    HEENT: Normocephalic, atraumatic     Neck: Trachea midline, no adenopathy    Lungs: clear to auscultation bilaterally    Heart: regular rate and rhythm    Abdomen: soft, non-tender; bowel sounds normal; no masses,  no organomegaly. Mild distension. No appreciable hernia amongst significant surgical scars    Extremities: Warm and well perfused        Lab Review:  Recent Results (from the past 24 hour(s))  Unfractionated Heparin (UFH), by anti-Xa    Collection Time: 04/08/23 11:07 PM   Result Value Ref Range    Anti-Xa, UFH 0.71   IU/mL   Lactate    Collection Time: 04/08/23 11:07 PM   Result Value Ref Range    Blood Lactate 19 (H) 5 - 18 mg/dL   Lactate    Collection Time: 04/09/23  5:28 AM   Result Value Ref Range    Blood Lactate 6 5 - 18 mg/dL   CBC    Collection Time: 04/09/23  5:30 AM   Result Value Ref Range    White Blood Cell Count 8.54 4.16 - 9.95 x10E3/uL    Red Blood Cell Count 3.48 (L) 3.96 - 5.09 x10E6/uL    Hemoglobin 9.3 (L) 11.6 - 15.2 g/dL    Hematocrit 16.1 (L) 34.9 - 45.2 %    Mean Corpuscular Volume 87.1 79.3 - 98.6 fL    Mean Corpuscular Hemoglobin 26.7 26.4 - 33.4 pg    MCH Concentration 30.7 (L) 31.5 - 35.5 g/dL    Red Cell Distribution Width-SD 51.4 (H) 36.9 - 48.3 fL    Red Cell Distribution Width-CV 16.1 (H) 11.1 - 15.5 %    Platelet Count, Auto 350 143 - 398 x10E3/uL    Mean Platelet Volume 8.8 (L) 9.3 - 13.0 fL    Nucleated RBC%, automated 0.0 No Ref. Range %    Absolute Nucleated RBC Count 0.00 0.00 - 0.00 x10E3/uL   Basic Metabolic Panel    Collection Time: 04/09/23  5:30 AM   Result Value Ref Range    Sodium 140 135 - 146 mmol/L    Potassium 3.6 3.6 - 5.3 mmol/L    Chloride 107 (H) 96 - 106 mmol/L    Total CO2 23 20 - 30 mmol/L    Anion Gap 10 8 - 19 mmol/L    Glucose 81 65 - 99 mg/dL    Creatinine 0.96 0.45 - 1.30 mg/dL    Estimated GFR 69 See GFR Additional Information mL/min/1.15m2    GFR Additional Information See Comment     Urea Nitrogen 10 7 - 22 mg/dL    Calcium 8.9 8.6 - 40.9 mg/dL   Magnesium    Collection Time: 04/09/23  5:30 AM   Result Value Ref Range    Magnesium 1.3 (L) 1.4 - 1.9 mEq/L   Unfractionated Heparin (UFH), by anti-Xa    Collection Time: 04/09/23  7:09 AM   Result Value Ref Range    Anti-Xa, UFH 0.53   IU/mL   Lactate    Collection Time: 04/09/23 10:09 AM   Result Value Ref Range    Blood Lactate 9 5 - 18 mg/dL   Unfractionated Heparin, by anti-Xa    Collection Time: 04/09/23  1:22 PM   Result Value Ref Range    Anti-Xa, UFH 0.39   IU/mL       Imaging:  US duplex abd limited portable    Result Date: 04/08/2023  US DUPLEX ABD LIMITED PORTABLE CLINICAL HISTORY: Evaluate SMA stenosis. COMPARISON: CTA abdomen/pelvis dated 04/08/2023. TECHNIQUE: Real time 2D grey scale, spectral and color flow Doppler imaging of the celiac and superior mesenteric artery was performed. FINDINGS: Atherosclerotic plaque in abdominal aorta: Moderate Waveforms: Normal low resistance waveforms throughout  Blood flow velocities are as follows: Abdominal aorta: 64 cm/s Celiac artery, proximal: 88 cm/s (normal < 200 cm/s) Celiac artery, distal: 129 cm/s SMA, proximal: 268 cm/s (normal < 275 cm/s) SMA, mid: 265 cm/s SMA,  distal: 224 cm/s     IMPRESSION: Patent celiac artery and superior mesenteric artery with borderline, approximately 50%, stenosis of the superior mesenteric artery at the origin. I, Verdell Carmine, M.D., have reviewed this radiological study personally and I am in full agreement with the findings of the report presented here. Signed by: Verdell Carmine   04/08/2023 11:14 PM    CT abd+pelvis angiogram wo+w contrast    Result Date: 04/08/2023  CT ABD+PELVIS ANGIOGRAM WO+W CONTRAST CLINICAL HISTORY: chronic messenteric ischemia. COMPARISON: CT angiogram from January 31, 2023 TECHNIQUE: On a multirow-detector CT scanner, a volumetric pre- and post-contrast scan was performed through the abdomen and pelvis in both the arterial and venous phases. Post processed CTA MIP reformatted images were provided. CONTRAST: iohexol (Omnipaque) 350 mg/mL inj 100 mL RADIATION DOSE: The patient received the following exposure event(s) during this study, and the dose reference values for each are as shown (CTDIvol in mGy, DLP in mGy-cm). Note that the values are not patient dose but numbers generated from scan acquisition factors based on 32 cm (L) and/or 16 cm (S) phantoms and may substantially under-estimate or over-estimate actual patient dose based on patient size and other factors. PreMonitoring, CTDI(L): 0.8, DLP: 0.8;Monitoring, CTDI(L): 4.5, DLP: 4.5;Abd wo, CTDI(L): 4.6, DLP: 114.7;Arterial, CTDI(L): 7, DLP: 210;Venous, CTDI(L): 7.9, DLP: 356.3 FINDINGS: VASCULAR FINDINGS: Active extravasation: None. Hematoma: None. Atherosclerotic calcifications: Moderate. Aorta: No aneurysm or dissection. Celiac artery: Patent with nonocclusive plaque at the origin. SMA: There is near circumferential atheromatous plaque at the origin of the SMA. The central lumen of the vessel is thrombosed. There is a thin rim of residual contrast flow at the perimeter of the vessel where it is not calcified. The residual lumen measures between 0.9 and 1.4 mm, in keeping with high-grade stenosis, similar in degree to prior examination. There is no intimal dissection. IMA: Patent. Renal arteries: Patent. SMV: Patent. IMV: Patent. IVC: Patent. ADDITIONAL FINDINGS: Lower chest: Moderate emphysematous lung disease including subpleural paraseptal emphysema of the right lower lobe lateral basal segment. There is diffuse parabronchial thickening and cylindrical bronchiolectasis of the bilateral lower lobes. Scattered foci of septal thickening and architectural distortion in keeping with chronic postinflammatory changes. Near complete resolution of pleural effusions. Trace residual low-density pleural thickening in the right posterior sulcus in keeping with residual pleural fluid versus chronic organized pleural reaction. Liver: Unremarkable. Gallbladder and bile ducts: Surgically absent gallbladder with mildly dilated bile ducts, likely related to postcholecystectomy state. Spleen: Unremarkable. Pancreas: Mildly atrophic. Adrenals: Unremarkable. Kidneys and ureters: Unremarkable. Bowel: Status post partial gastrectomy and gastrojejunostomy. Nondilated bowel with no wall thickening. Large stool volume. Normal appendix. Bladder: Unremarkable. Reproductive organs: Unremarkable. Lymph nodes: Unremarkable. Peritoneum: No free fluid or free air. Abdominal wall: Postsurgical changes in the anterior abdominal wall. Diffuse body wall edema. Dystrophic calcifications in the gluteal subcutaneous tissues.. Bones: Multilevel degenerative changes of the spine with grade 1 spondylotic anterolisthesis of L5-S1. Severely degenerated L5-S1 disc with vacuum disc phenomenon and extensive discogenic endplate sclerosis.     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 Clinically correlate for mesenteric angina. No direct CT findings of ischemic enteritis or colitis. Signed by: Carie Caddy   04/08/2023 10:14 AM         Assessment and Recommendations:     This is a Elisia Stepp is a 57 y.o. female with known SMA stenosis and symptoms consistent with chronic mesenteric ischemia presenting with acute abdominal pain and distension, acute on chronic non-bloody diarrhea and non-occlusive thrombus  of SMA on CTA. Unclear whether this is acute vs. Chronic and whether her symptoms are primarily of vascular etiology, however given there is reasonable concern, recommend admission for therapeutic anticoagulation and serial abdominal exams, consideration of urgent intervention of clinically worsening. IR evaluated patient/imaging and deemed patient would not benefit from endovascular intervention.    - Can consider low-dose therapeutic anticoagulation (Eliquis 2.5 mg BID) to prevent further thrombosis  - Vascular surgery to sign off at this time    Patient and plan discussed with attending Vascular surgeon Dr. Irene Shipper    Author: Roney Marion, MD 04/09/2023 3:45 PM  PGY-1 Vascular Surgery Service  [phone]

## 2023-04-09 NOTE — ED Notes
Report given to Shanda Bumps, RN in [facility].

## 2023-04-09 NOTE — Consults
INPATIENT CONSULTATION/ INTERVENTIONAL RADIOLOGY     PATIENT:  Donna Gross  MRN:  [id number]  DOB:  [birth year]  DATE OF SERVICE:  04/08/2023    REFERRING PHYSICIAN/ SERVICE:  Danny Lawless., MD    INTERVENTIONAL RADIOLOGY ATTENDING: Bryson Corona, MD   LOS: 0 days     ADMISSION DIAGNOSIS: Superior mesenteric artery stenosis (HCC/RAF) [K55.1]    REASON FOR CONSULTATION: chronic mesenteric ischemia    History obtained from interview with patient as well as review of available records in EMR.    HPI:  Donna Gross is a 57 y.o. female w PMHx COPD, colonic perforation s/p colostomy  s/p reversal, PUD c/b gastric outlet obstruction s/p multiple surgeries including laparscopic vagotomy, antrectomy and gastrojejunostomy (Dr. [name]), cholelithiasis, and gallstone pancreatitis who presents with acute on chronic abdominal pain.      Pt was seen by GI, EGD was done with no significant findings. Colonoscopy was planned but pt could not tolerate Bowel prep and was rescheduled to outpatient. Given the patient's post prandial pain, a CTA was ordered and found to have patent mesenteric vasculature with moderate narrowing of the superior mesenteric artery origin, and mild narrowing at the celiac origin. Seem by Vascular Surgery on 4/8 who felt her symptoms were consistent with chronic mesenteric ischemia given similar SMA plaques and narrowing on CTs since 04/2022.     Pertinent labs include:  WBC 6.7. Hgb 10.6. Plt 418. INR 1.0. Lactate 18. VS wnl.    ALLERGIES:   Allergies   Allergen Reactions    Hydrocodone Other (See Comments)     ''burns a hole in stomach''  Tolerates hydromorphone    Ibuprofen Other (See Comments)     GI discomfort    ''hurts my stomach''    Morphine Itching     Tolerates hydromorphone       MEDICATIONS:  Current Facility-Administered Medications   Medication Dose Route Frequency    acetaminophen tab 1,000 mg  1,000 mg Oral Q6H PRN    albuterol 90 mcg/act inh 2 puff  2 puff Inhalation Q6H PRN    calcium carbonate chew tab 500 mg  500 mg Oral Q4H PRN    [COMPLETED] cefOXitin 1 g IVPB MB ([facility] ONLY)  1 g Intravenous STAT    [START ON 04/09/2023] cholestyramine pwd packet 4 g  4 g Oral TID    [START ON 04/09/2023] DULoxetine DR cap 30 mg  30 mg Oral Daily    fluticasone 220 mcg/act inh 1 puff  1 puff Inhalation Daily    fluticasone-salmeterol 250-50 mcg/act diskus inhaler 1 puff  1 puff Inhalation BID    [COMPLETED] heparin 1000 unit/mL inj 4,100 Units  80 Units/kg Intravenous Once    heparin 25,000 units in 0.45% NaCl 250 mL drip RTU  900 Units/hr Intravenous Continuous    heparin per pharmacy   Does not apply Continuous    [COMPLETED] HYDROmorphone 1 mg/mL inj 0.5 mg  0.5 mg IV Push STAT    HYDROmorphone 1 mg/mL inj 0.6 mg  0.6 mg IV Push Q4H PRN    [COMPLETED] HYDROmorphone 1 mg/mL inj 1 mg  1 mg IV Push STAT    [COMPLETED] HYDROmorphone 1 mg/mL inj 1 mg  1 mg IV Push STAT    [COMPLETED] iohexol (Omnipaque) 350 mg/mL inj 100 mL  100 mL Intravenous Once    ipratropium-albuterol 20-100 mcg/act inh 1 puff  1 puff Inhalation Q6H PRN    lidocaine 5% patch 1 patch  1 patch  Transdermal Q24H    [COMPLETED] metroNIDAZOLE 500 mg in sodium chloride 100 mL IVPB RTU  500 mg Intravenous Once    nicotine 7 mg/24 hr patch 7 mg  7 mg Transdermal Daily    [COMPLETED] ondansetron 4 mg/2 mL inj 4 mg  4 mg Intravenous Once    [COMPLETED] ondansetron 4 mg/2 mL inj 4 mg  4 mg Intravenous Once    ondansetron tab 4 mg  4 mg Oral Q8H PRN    Or    ondansetron 4 mg/2 mL inj 4 mg  4 mg Intravenous Q8H PRN    oxyCODONE-acetaminophen 5-325 mg tab 1 tablet  1 tablet Oral Q4H PRN    pancrelipase (Lip-Prot-Amyl) (Creon) DR cap 36,000 units of lipase  36,000 units of lipase Oral TID PRN    pantoprazole inj 40 mg  40 mg IV Push Q24H    prochlorperazine 10 mg/2 mL inj 5 mg  5 mg IV Push Q6H PRN    QUEtiapine tab 150 mg  150 mg Oral BID    [COMPLETED] sodium chloride 0.9% IV soln bolus 1,000 mL  1,000 mL Intravenous STAT    sodium chloride 0.9% IV soln  10 mL/hr Intravenous Continuous    traZODone tab 50 mg  50 mg Oral QHS PRN     Current Outpatient Medications   Medication Sig    acetaminophen 500 mg tablet Take 2 tablets (1,000 mg total) by mouth every six (6) hours as needed for Pain.    albuterol 90 mcg/act inhaler Inhale 2 puffs every six (6) hours as needed for Wheezing or Shortness of Breath.    B Complex Vitamins (VITAMIN B COMPLEX) capsule Take 1 capsule by mouth daily.    cholestyramine 4 g/dose powder Take 1 packet (4 g total) by mouth three (3) times daily with meals.    clobetasol 0.05% cream Apply topically three (3) times daily as needed (rash).    cyanocobalamin 1000 mcg tablet Take 1 tablet (1,000 mcg total) by mouth daily.    DULoxetine 30 mg DR capsule Take 1 capsule (30 mg total) by mouth daily.    fluticasone 220 mcg/act inhaler Inhale 1 by mouth puff daily.    fluticasone-salmeterol 250-50 mcg/act diskus inhaler Inhale 1 puff two (2) times daily.    ipratropium-albuterol 20-100 mcg/act inhaler Inhale 1 puff every six (6) hours as needed for Wheezing or Shortness of Breath.    naloxone 4 mg/0.1 mL nasal spray Call 911. Administer a single spray intranasally into one nostril for opioid overdose. May repeat in 3 minutes if patient is not breathing..    ondansetron 4 mg tablet Take 1 tablet (4 mg total) by mouth every six (6) hours as needed for Nausea.    oxyCODONE-acetaminophen 7.5-325 MG per tablet Take 1 tablet by mouth every four (4) hours as needed.    pancrelipase, Lip-Prot-Amyl, (CREON) 36000 units DR capsule Take 1 capsule (36,000 units of lipase total) by mouth three (3) times daily with meals.    QUEtiapine Fumarate (SEROQUEL PO) Take 150 mg by mouth two (2) times daily.     PAST MEDICAL HISTORY:   Past Medical History:   Diagnosis Date    Anxiety     Depression     Emphysema, unspecified (HCC/RAF)     Emphysema, unspecified (HCC/RAF)     Fibromyalgia     Gastritis     GERD (gastroesophageal reflux disease)     History of blood transfusion Intractable nausea and vomiting 01/23/2020  Pancreatitis      PAST SURGICAL HISTORY:  Past Surgical History:   Procedure Laterality Date    ABDOMINAL SURGERY      COLON SURGERY       FAMILY HISTORY:  Family History   Problem Relation Age of Onset    Heart disease Mother     Diabetes Brother     Diabetes Father     Anesthesia problems Neg Hx     Malignant hypertension Neg Hx     Hypotension Neg Hx     Malignant hyperthermia Neg Hx     Pseudochol deficiency Neg Hx      SOCIAL HISTORY:  Social History     Tobacco Use    Smoking status: Every Day     Years: 20     Types: Cigarettes    Smokeless tobacco: Current    Tobacco comments:     1 cig per day   Substance Use Topics    Alcohol use: No    Drug use: No     REVIEW OF SYSTEMS:   A complete review of 14 systems was performed. Additional symptoms were otherwise negative and/or non-contributory, except as listed above     PHYSICAL EXAM:  Vital Signs:  Temp:  [36.4 ?C (97.5 ?F)-36.6 ?C (97.9 ?F)] 36.4 ?C (97.5 ?F)  Heart Rate:  [58-75] 60  Resp:  [12-23] 15  BP: (115-178)/(66-104) 153/82  NBP Mean:  [86-119] 100  SpO2:  [93 %-99 %] 95 %  Vitals:    04/08/23 0703   Weight: 50.9 kg (112 lb 3.4 oz)      GENERAL: Comfortable, NAD.  HEENT: Face symmetric. Clear oropharynx.  CARDIOVASCULAR: Regular rate and rhythm, no ectopy on monitor.  PUMONARY: On RA  - Normal respiratory excursions, not tachypneic, no labored breathing.  ABD: Non-distended, soft, non-tender.  GU: Voiding  EXT: WWP, no c/c/e  NEURO: A&Ox3, moving all ext., Follows commands  CENTRAL LINE: None  DRAINS: None    DETAILED I&O:  No intake/output data recorded.  No intake or output data in the 24 hours ending 04/08/23 1853     LAB REVIEW:    WBC/Hgb/Hct/Plts:  6.72/10.6/34.6/418 (04/18 0805)   Na/K/Cl/CO2/BUN/Cr/glu:  141/3.6/105/21/8/0.86/86 (04/18 0805)   PT/INR/APTT/Fib:  13.0/1.0/30.8/-- (04/18 1319)   AST/ALT/Bili T/Alk Phos/Prot/Alb:  24/8/0.2/77/7.4/4.5 (04/18 0805)    Recent Results (from the past 24 hour(s))   ED INFORMATION EXCHANGE Ordered by an unspecified provider    Collection Time: 04/08/23  6:58 AM   Result Value Ref Range    Emer. Dept. Info Exchange - Care Plan      Emer. Dept. Engineer, mining. Dept. Info Exchange - 30 day Visit Count 1     Emer. Dept. Info Exchange - 180 day Visit Count 3    ECG 12 lead    Collection Time: 04/08/23  7:34 AM   Result Value Ref Range    Ventricular Rate 64 BPM    Atrial Rate 64 BPM    P-R Interval 112 ms    QRS Duration 85 ms    Q-T Interval 420 ms    QTC Calculation (Bezet) 434 ms    P Axis 83 degrees    R Axis 69 degrees    T Axis 81 degrees    Diagnosis Sinus rhythm     Diagnosis Borderline short PR interval     Diagnosis Abnormal R-wave progression, early transition     Diagnosis 12  Lead; Mason-Likar     Diagnosis Otherwise Normal ECG    Comprehensive Metabolic Panel    Collection Time: 04/08/23  8:05 AM   Result Value Ref Range    Sodium 141 135 - 146 mmol/L    Potassium 3.6 3.6 - 5.3 mmol/L    Chloride 105 96 - 106 mmol/L    Total CO2 21 20 - 30 mmol/L    Anion Gap 15 8 - 19 mmol/L    Glucose 86 65 - 99 mg/dL    Creatinine 1.47 8.29 - 1.30 mg/dL    Estimated GFR 78 See GFR Additional Information mL/min/1.40m2    GFR Additional Information See Comment     Urea Nitrogen 8 7 - 22 mg/dL    Calcium 9.7 8.6 - 56.2 mg/dL    Total Protein 7.4 6.1 - 8.2 g/dL    Albumin 4.5 3.9 - 5.0 g/dL    Bilirubin,Total 0.2 0.1 - 1.2 mg/dL    Alkaline Phosphatase 77 37 - 113 U/L    Aspartate Aminotransferase 24 13 - 62 U/L    Alanine Aminotransferase 8 8 - 70 U/L   Lipase    Collection Time: 04/08/23  8:05 AM   Result Value Ref Range    Lipase 34 13 - 69 U/L   Lactate    Collection Time: 04/08/23  8:05 AM   Result Value Ref Range    Blood Lactate 18 5 - 18 mg/dL   CBC    Collection Time: 04/08/23  8:05 AM   Result Value Ref Range    White Blood Cell Count 6.72 4.16 - 9.95 x10E3/uL    Red Blood Cell Count 3.98 3.96 - 5.09 x10E6/uL    Hemoglobin 10.6 (L) 11.6 - 15.2 g/dL    Hematocrit 13.0 (L) 34.9 - 45.2 %    Mean Corpuscular Volume 86.9 79.3 - 98.6 fL    Mean Corpuscular Hemoglobin 26.6 26.4 - 33.4 pg    MCH Concentration 30.6 (L) 31.5 - 35.5 g/dL    Red Cell Distribution Width-SD 51.2 (H) 36.9 - 48.3 fL    Red Cell Distribution Width-CV 15.9 (H) 11.1 - 15.5 %    Platelet Count, Auto 418 (H) 143 - 398 x10E3/uL    Mean Platelet Volume 8.4 (L) 9.3 - 13.0 fL    Nucleated RBC%, automated 0.0 No Ref. Range %    Absolute Nucleated RBC Count 0.00 0.00 - 0.00 x10E3/uL    Neutrophil Abs (Prelim) 4.24 See Absolute Neut Ct. x10E3/uL   Differential, Automated    Collection Time: 04/08/23  8:05 AM   Result Value Ref Range    Neutrophil Percent, Auto 63.1 No Ref. Range %    Lymphocyte Percent, Auto 30.7 No Ref. Range %    Monocyte Percent, Auto 4.0 No Ref. Range %    Eosinophil Percent, Auto 1.3 No Ref. Range %    Basophil Percent, Auto 0.6 No Ref. Range %    Immature Granulocytes% 0.3 No Reference Range %    Absolute Neut Count 4.24 1.80 - 6.90 x10E3/uL    Absolute Lymphocyte Count 2.06 1.30 - 3.40 x10E3/uL    Absolute Mono Count 0.27 0.20 - 0.80 x10E3/uL    Absolute Eos Count 0.09 0.00 - 0.50 x10E3/uL    Absolute Baso Count 0.04 0.00 - 0.10 x10E3/uL    Absolute Immature Gran Count 0.02 0.00 - 0.04 x10E3/uL   Magnesium    Collection Time: 04/08/23  8:05 AM   Result Value Ref Range  Magnesium 1.4 1.4 - 1.9 mEq/L   Phosphorus    Collection Time: 04/08/23  8:05 AM   Result Value Ref Range    Phosphorus 4.0 2.3 - 4.4 mg/dL   UA,Dipstick    Collection Time: 04/08/23 11:06 AM    Specimen: Clean Catch, Midstream; Urine   Result Value Ref Range    Urine Color Colorless      Specific Gravity 1.040 (H) 1.005 - 1.030    pH,Urine 6.5 5.0 - 8.0    Blood Negative Negative    Bilirubin Negative Negative    Ketones Negative Negative    Glucose Negative Negative    Protein Negative Negative    Leukocyte Esterase Negative Negative    Nitrite Negative Negative   UA,Microscopic    Collection Time: 04/08/23 11:06 AM    Specimen: Clean Catch, Midstream; Urine   Result Value Ref Range    RBC per uL 0 0 - 11 cells/uL    WBC per uL 2 0 - 22 cells/uL    RBC per HPF 0 0 - 2 cells/HPF    WBC per HPF 0 0 - 4 cells/HPF    Squamous Epi Cells 3 0 - 17 cells/uL   Unfractionated Heparin, by anti-Xa    Collection Time: 04/08/23  1:19 PM   Result Value Ref Range    Anti-Xa, UFH <0.10   IU/mL   APTT    Collection Time: 04/08/23  1:19 PM   Result Value Ref Range    APTT 30.8 24.4 - 36.2 seconds   Prothrombin Time Panel    Collection Time: 04/08/23  1:19 PM   Result Value Ref Range    Prothrombin Time 13.0 11.5 - 14.4 seconds    INR 1.0 .   Lactate    Collection Time: 04/08/23  3:25 PM   Result Value Ref Range    Blood Lactate 8 5 - 18 mg/dL     Absolute Neut Count   Date/Time Value Ref Range Status   04/08/2023 08:05 AM 4.24 1.80 - 6.90 x10E3/uL Final   11/24/2022 06:41 AM 4.94 1.80 - 6.90 x10E3/uL Final   07/23/2022 07:56 AM 5.14 1.80 - 6.90 x10E3/uL Final   05/18/2022 08:33 AM 2.85 1.80 - 6.90 x10E3/uL Final   05/03/2022 05:26 AM 2.66 1.80 - 6.90 x10E3/uL Final   05/02/2022 10:45 AM 4.18 1.80 - 6.90 x10E3/uL Final     Absolute Neutrophil Count (Outside Lab)   Date/Time Value Ref Range Status   03/09/2021 12:00 AM 5.2  Final     MICRO:  No results found for this or any previous visit (from the past 168 hour(s)).    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 Clinically correlate for mesenteric angina.  No direct CT findings of ischemic enteritis or colitis.     ASSESSMENT:  Deziree Mokry is a 57 y.o. female w PMHx COPD, colonic perforation s/p colostomy  s/p reversal, PUD c/b gastric outlet obstruction s/p multiple surgeries including laparscopic vagotomy, antrectomy and gastrojejunostomy (Dr. [name]), cholelithiasis, and gallstone pancreatitis who presents with acute on chronic abdominal pain.      Pt was seen by GI, EGD was done with no significant findings. Colonoscopy was planned but pt could not tolerate Bowel prep and was rescheduled to outpatient. Given the patient's post prandial pain, a CTA was ordered and found to have patent mesenteric vasculature with moderate narrowing of the superior mesenteric artery origin, and  mild narrowing at the celiac origin. Seem by Vascular Surgery on 4/8 who felt her symptoms were consistent with chronic mesenteric ischemia given similar SMA plaques and narrowing on CTs since 04/2022.     CTA 4/18 with near circumferential atheromatous plaque at the origin of the SMA. The central lumen of the vessel is thrombosed. There is a thin rim of residual contrast flow at the perimeter of the vessel where it is not calcified. The residual lumen measures between 0.9 and 1.4 mm, in keeping with high-grade stenosis, similar in degree to prior examination. There is no intimal dissection. Celiac artery is patent. IMA is patent.    We recommend the following:   - SMA narrowing is similar to prior studies. Given normal size and patency of the IMA and CA, we are not convinced the SMA narrowing is responsible for the patient's abdominal pain, and therefore stenting would not be beneficial.   - Recommend transabdominal US to identify stenosis and post-stenotic turbulence  - No indication for urgent intervention given normal exam/labs  - Therapeutic AC  - We will continue to follow      The patient was discussed with the Interventional Radiology Attending who agrees with assessment and plans listed above.    Medical decision making: High complexity, High risk     I spent 75 minutes evaluating this patients [facility] records and available outside medical records, this includes review of pertinent labs, micro data, and imaging, discussing the plan of care with the IR attending, with the primary team. During the consultation, I reviewed pertinent medical history, performed a focused physical exam, reviewed imaging with the patient, and discussed risks, benefits, and alternatives of possible SMA stenting.     Author:  Stefano Gaul  6:53 PM 04/08/2023  Interventional Radiology  [facility]

## 2023-04-10 LAB — Blood Lactate
BLOOD LACTATE: 9 mg/dL (ref 5–18)
BLOOD LACTATE: 16 mg/dL (ref 5–18)

## 2023-04-10 MED ADMIN — HYDROMORPHONE HCL 1 MG/ML IJ SOLN: .6 mg | INTRAVENOUS | @ 06:00:00 | Stop: 2023-04-15 | NDC 00409128331

## 2023-04-10 MED ADMIN — LIDOCAINE 5 % EX PTCH: 1 | TRANSDERMAL | Stop: 2023-04-13 | NDC 00591352511

## 2023-04-10 MED ADMIN — NICOTINE 7 MG/24HR TD PT24: 7 mg | TRANSDERMAL | @ 02:00:00 | Stop: 2023-04-13

## 2023-04-10 MED ADMIN — PANTOPRAZOLE SODIUM 40 MG IV SOLR: 40 mg | INTRAVENOUS | @ 23:00:00 | Stop: 2023-04-10 | NDC 00781323295

## 2023-04-10 MED ADMIN — HYDROMORPHONE HCL 1 MG/ML IJ SOLN: .6 mg | INTRAVENOUS | @ 12:00:00 | Stop: 2023-04-13 | NDC 00409128331

## 2023-04-10 MED ADMIN — PANCRELIPASE (LIP-PROT-AMYL) 36000-114000 UNITS PO CPEP: 36000 [IU] | ORAL | @ 17:00:00 | Stop: 2023-04-13 | NDC 00032301613

## 2023-04-10 MED ADMIN — NICOTINE 7 MG/24HR TD PT24: 7 mg | TRANSDERMAL | @ 04:00:00 | Stop: 2023-04-13 | NDC 00536589488

## 2023-04-10 MED ADMIN — HYDROMORPHONE HCL 1 MG/ML IJ SOLN: .6 mg | INTRAVENOUS | @ 22:00:00 | Stop: 2023-04-13 | NDC 00409128331

## 2023-04-10 MED ADMIN — FLUTICASONE-SALMETEROL 250-50 MCG/ACT IN AEPB: 1 | RESPIRATORY_TRACT | @ 20:00:00 | Stop: 2023-04-13

## 2023-04-10 MED ADMIN — DULOXETINE HCL 30 MG PO CPEP: 30 mg | ORAL | @ 16:00:00 | Stop: 2023-04-13 | NDC 60687073411

## 2023-04-10 MED ADMIN — OXYCODONE-ACETAMINOPHEN 5-325 MG PO TABS: 1 | ORAL | @ 16:00:00 | Stop: 2023-04-13 | NDC 00904709361

## 2023-04-10 MED ADMIN — FLUTICASONE-SALMETEROL 250-50 MCG/ACT IN AEPB: 1 | RESPIRATORY_TRACT | @ 20:00:00 | Stop: 2023-04-13 | NDC 00054032756

## 2023-04-10 MED ADMIN — HYDROMORPHONE HCL 1 MG/ML IJ SOLN: .6 mg | INTRAVENOUS | @ 17:00:00 | Stop: 2023-04-13 | NDC 00409128331

## 2023-04-10 MED ADMIN — CHOLECALCIFEROL 25 MCG (1000 UT) PO TABS: 50 ug | ORAL | @ 16:00:00 | Stop: 2023-04-13

## 2023-04-10 MED ADMIN — APIXABAN 5 MG PO TABS: 5 mg | ORAL | @ 04:00:00 | Stop: 2023-04-13 | NDC 00003089431

## 2023-04-10 MED ADMIN — B COMPLEX PO CAPS: 1 | ORAL | @ 05:00:00 | Stop: 2023-05-10

## 2023-04-10 MED ADMIN — FLUTICASONE-SALMETEROL 250-50 MCG/ACT IN AEPB: 1 | RESPIRATORY_TRACT | @ 04:00:00 | Stop: 2023-04-13 | NDC 00054032756

## 2023-04-10 MED ADMIN — CHOLESTYRAMINE 4 G PO PACK: 4 g | ORAL | @ 23:00:00 | Stop: 2023-04-13 | NDC 27241013421

## 2023-04-10 MED ADMIN — PANTOPRAZOLE SODIUM 40 MG PO TBEC: 40 mg | ORAL | @ 16:00:00 | Stop: 2023-05-10 | NDC 50268063911

## 2023-04-10 MED ADMIN — LIDOCAINE 5 % EX PTCH: 1 | TRANSDERMAL | @ 23:00:00 | Stop: 2023-04-13

## 2023-04-10 MED ADMIN — VITAMIN B-12 500 MCG PO TABS: 1000 ug | ORAL | @ 16:00:00 | Stop: 2023-04-13 | NDC 50268085415

## 2023-04-10 MED ADMIN — CHOLESTYRAMINE 4 G PO PACK: 4 g | ORAL | @ 17:00:00 | Stop: 2023-04-13 | NDC 27241013421

## 2023-04-10 MED ADMIN — CHOLESTYRAMINE 4 G PO PACK: 4 g | ORAL | @ 04:00:00 | Stop: 2023-04-10

## 2023-04-10 MED ADMIN — HYDROMORPHONE HCL 1 MG/ML IJ SOLN: .6 mg | INTRAVENOUS | @ 02:00:00 | Stop: 2023-04-13 | NDC 00409128331

## 2023-04-10 MED ADMIN — PANTOPRAZOLE SODIUM 40 MG IV SOLR: 40 mg | INTRAVENOUS | @ 23:00:00 | Stop: 2023-04-10

## 2023-04-10 MED ADMIN — APIXABAN 5 MG PO TABS: 5 mg | ORAL | @ 16:00:00 | Stop: 2023-04-13 | NDC 00003089431

## 2023-04-10 MED ADMIN — OXYCODONE-ACETAMINOPHEN 5-325 MG PO TABS: 1 | ORAL | @ 04:00:00 | Stop: 2023-04-13 | NDC 63481062370

## 2023-04-10 MED ADMIN — OXYCODONE-ACETAMINOPHEN 5-325 MG PO TABS: 1 | ORAL | @ 20:00:00 | Stop: 2023-04-13 | NDC 00406051262

## 2023-04-10 MED ADMIN — PANCRELIPASE (LIP-PROT-AMYL) 36000-114000 UNITS PO CPEP: 36000 [IU] | ORAL | @ 23:00:00 | Stop: 2023-04-13 | NDC 00032301613

## 2023-04-10 MED ADMIN — ZZ IMS TEMPLATE: 150 mg | ORAL | @ 04:00:00 | Stop: 2023-04-11 | NDC 60687034911

## 2023-04-10 MED ADMIN — FLUTICASONE PROPIONATE HFA 220 MCG/ACT IN AERO: 1 | RESPIRATORY_TRACT | @ 20:00:00 | Stop: 2023-04-13 | NDC 66993008096

## 2023-04-10 MED ADMIN — PANCRELIPASE (LIP-PROT-AMYL) 36000-114000 UNITS PO CPEP: 36000 [IU] | ORAL | @ 05:00:00 | Stop: 2023-04-13 | NDC 00032301613

## 2023-04-10 MED ADMIN — ZZ IMS TEMPLATE: 150 mg | ORAL | @ 16:00:00 | Stop: 2023-04-11 | NDC 60687034911

## 2023-04-10 MED ADMIN — PANTOPRAZOLE SODIUM 40 MG PO TBEC: 40 mg | ORAL | Stop: 2023-04-13

## 2023-04-10 MED ADMIN — CHOLESTYRAMINE 4 G PO PACK: 4 g | ORAL | @ 05:00:00 | Stop: 2023-04-13 | NDC 27241013421

## 2023-04-10 MED ADMIN — LIDOCAINE 5 % EX PTCH: 1 | TRANSDERMAL | @ 12:00:00 | Stop: 2023-05-08

## 2023-04-10 MED ADMIN — NICOTINE 7 MG/24HR TD PT24: 7 mg | TRANSDERMAL | @ 23:00:00 | Stop: 2023-04-13

## 2023-04-10 NOTE — Other
Patient's Clinical Goal:   Clinical Goal(s) for the Shift: vss, pain control, comfort and safety  Identify possible barriers to advancing the care plan: none  Stability of the patient: Moderately Stable - low risk of patient condition declining or worsening   Progression of Patient's Clinical Goal:   AOX4, no SOB, breathing even and non labored in room air, spo2 95% RA, continue to complaint of abdominal pain with improvement, patient took oxycodone/acetaminophen and hydromorphone ivp, tolerated diet, denies nausea and vomiting, no acute signs of distress noted, VSS.

## 2023-04-10 NOTE — Nursing Note
Handoff report to [facility].

## 2023-04-10 NOTE — Nursing Note
Received report from [facility], assumed care, patient in bed AOX4, no pain reported educated on call light used and fall prevention, pt verbalized understanding.

## 2023-04-10 NOTE — Progress Notes
INTERNAL MEDICINE - INPATIENT PROGRESS NOTE    ADMISSION DATE:     DATE OF SERVICE: 04/10/2023  HOSPITAL DAY: 0    PRINCIPLE PROBLEM: Superior mesenteric artery stenosis (HCC/RAF)    CC: Abdominal Pain (Pt reports abdominal swelling and pain, different than her normal chronic abd pain. Pt c/o nausea and diarrhea, but states this has been ongoing since gallbladder surgery last year. )    INTERVAL EVENTS AND REVIEW OF SYSTEMS:    No acute event overnight  Patient tolerating PO, will advance diet today  Continues to report abdominal discomfort.     MEDICATIONS:  apixaban, 5 mg, Oral, BID  cholestyramine, 4 g, Oral, TID w/meals  cyanocobalamin, 1,000 mcg, Oral, Daily  DULoxetine, 30 mg, Oral, Daily  fluticasone, 1 puff, Inhalation, Daily  fluticasone-salmeterol, 1 puff, Inhalation, BID  lidocaine, 1 patch, Transdermal, Q24H  nicotine patch, 7 mg, Transdermal, Daily  pancrelipase (Lip-Prot-Amyl), 36,000 units of lipase, Oral, TID w/meals  pantoprazole, 40 mg, Oral, Daily  QUEtiapine, 150 mg, Oral, BID  vitamin B complex, 1 capsule, Oral, Daily  vitamin D (cholecalciferol), 50 mcg, Oral, Daily  PRNs: acetaminophen, albuterol, calcium carbonate, HYDROmorphone, hyoscyamine, ipratropium-albuterol, ondansetron **OR** ondansetron injection/IVPB, oxyCODONE-acetaminophen, pancrelipase (Lip-Prot-Amyl), prochlorperazine, traZODone    VITALS:  Temp:  [36.6 ?C (97.8 ?F)-37.2 ?C (98.9 ?F)] 37.1 ?C (98.7 ?F)  Heart Rate:  [66-83] 83  Resp:  [17-19] 18  BP: (92-125)/(61-83) 106/63  NBP Mean:  [71-94] 77  SpO2:  [95 %-96 %] 96 %     Weight: 50.9 kg (112 lb 3.4 oz) Oxygen Therapy  SpO2: 96 %  O2 Device: None (Room air)  Flow Rate (L/min): 1 L/min     IN'S AND OUT'S:  I/O last 2 completed shifts:  In: 470.3 [P.O.:340; I.V.:130.3]  Out: -     PHYSICAL EXAM:  General: alert, well appearing, and in no distress.   Cardiac: regular rate and rhythm, no murmurs. Normal dorsalis pedal pulses bilateral. No peripheral edema.  Lungs: Clear to auscultation, normal work of breathing  Abdomen: soft, nontender, nondistended  Neuro: fully oriented    DATA  I have reviewed the following information from the last 24 hours: allied health and treating physician notes, imaging, labs and microbiology data, and cardiac studies and telemetry data (if on monitor)                     ASSESSMENT:  Donna Gross is a 57 y.o. 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      #SMA Stenosis, POA Patient with known SMA stenosis sand symptomology c/w chronic mesenteric ischemia. 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 IR evaluated patient found patient would not benefit from endovascular intervention.   #Acute on Chronic Abdominal Pain, POA   -Appreciate IR Vascular Surgery consult and recs:   - s/p therapeutic heparin gtt given thrombosis on CTA within high-grade SMA stenosis; transition to eliquis 5 mg BID now  -Pain Management with tylenol, norco, and IV dilaudid   -stop IVF   -cont PPI  -continue home hyoscyamine prn      #Diarrhea- POA, chronic  #hx of pancreatitis  #Severe protein calorie malnutrition, POA  C diff, H pylori, celiac studies, parasite enteric panel negative. EGD without acute source. Patient previously unable to tolerate colonoscopy. Previously seen by GI during recent  hospitalization Outpatient colonoscopy, could consider SIBO testing outpatient.   - Tylenol/Oxy/Dilaudid and lidocaine patch for pain  - Consult to Nutrition Services   - Zofran 4 mg PRN 1st line, compazine 2nd line for nausea   - PPI BID  - resume home pancrelipase (creon) 36,000 u TIDAC while NPO  - resume home cholestyramine 4g TID once diet while NPO  - patient unable to tolerate prep for colonoscopy, will plan for outpatient colo, schedule on dc   - vascular surgery consulted for SMA narrowing as above                  -can stop heparin gtt                 -start eliquis 5 mg BID (4/19-)                  -vascular follow up      #Tobacco Use, POA  - nicotine patch   - counseled about nicotine cessation      Chronic/stable:  #Anxiety  #Major Depression  #Fibromyalgia   #Insomnia  - Continue Seroquel 150mg  BID  - Continue duloxetine 40 mg qday   - Trazodone 50 mg at bedtime PRN       I have seen and examined the patient and agree with the RD assessment detailed below:  Patient meets criteria for: Severe protein calorie malnutrition    (current weight 50.9 kg (112 lb 3.4 oz),  ;  ,  ).   See RD notes for additional details.    INPATIENT CHECKLIST:  Diet regular  VTE Prophylaxis: apixaban  Central Lines: none  Tubes/Drains: none  Activity: as tolerated  Precautions: No infectious isolation,     ADVANCED DIRECTIVES:  Full Code, Primary Emergency Contact: [name],[name]    DISPOSITION:  Observation, Monitored. Expected post-hospitalization disposition will be to home.    AUTHOR:  Areeba Sulser M. Ninetta Lights, MD  04/10/2023 at 4:59 PM

## 2023-04-10 NOTE — Other
Patient's Clinical Goal:   Clinical Goal(s) for the Shift: VSS, I&O's, pain management, comfort, rest, safety, continue POC  Identify possible barriers to advancing the care plan: None  Stability of the patient: Moderately Unstable - medium risk of patient condition declining or worsening    Progression of Patient's Clinical Goal:     A/Ox4, VSS. Denies SOB and chest pain. C/o abdominal pain managed w/ IVP dilaudid and percocet. BMAT 4, ambulates independently w/ steady gait. Heparin gtt discontinued per order. Tolerated blenderized liquid, now on soft bite size. All needs met. Comfort and safety maintained. Continue POC

## 2023-04-11 MED ORDER — APIXABAN 5 MG PO TABS
5 mg | ORAL_TABLET | Freq: Two times a day (BID) | ORAL | 0 refills | 30 days | Status: SS
Start: 2023-04-11 — End: ?

## 2023-04-11 MED ORDER — APIXABAN 5 MG PO TABS
5 mg | ORAL_TABLET | Freq: Two times a day (BID) | ORAL | 0 refills | Status: AC
Start: 2023-04-11 — End: 2023-04-12
  Filled 2023-04-13 (×2): qty 60, 30d supply, fill #0

## 2023-04-11 MED ADMIN — PANCRELIPASE (LIP-PROT-AMYL) 36000-114000 UNITS PO CPEP: 36000 [IU] | ORAL | @ 02:00:00 | Stop: 2023-04-13

## 2023-04-11 MED ADMIN — NICOTINE 7 MG/24HR TD PT24: 7 mg | TRANSDERMAL | Stop: 2023-04-13

## 2023-04-11 MED ADMIN — QUETIAPINE FUMARATE 50 MG PO TABS: 150 mg | ORAL | @ 04:00:00 | Stop: 2023-04-13 | NDC 00904663961

## 2023-04-11 MED ADMIN — DULOXETINE HCL 30 MG PO CPEP: 30 mg | ORAL | @ 15:00:00 | Stop: 2023-04-13 | NDC 60687073411

## 2023-04-11 MED ADMIN — OXYCODONE-ACETAMINOPHEN 5-325 MG PO TABS: 1 | ORAL | @ 05:00:00 | Stop: 2023-04-13 | NDC 00406051262

## 2023-04-11 MED ADMIN — CALCIUM CARBONATE ANTACID 500 (200 CA) MG PO CHEW: 500 mg | ORAL | @ 03:00:00 | Stop: 2023-04-13 | NDC 68084098833

## 2023-04-11 MED ADMIN — PANCRELIPASE (LIP-PROT-AMYL) 36000-114000 UNITS PO CPEP: 36000 [IU] | ORAL | @ 20:00:00 | Stop: 2023-04-13 | NDC 00032301613

## 2023-04-11 MED ADMIN — CHOLECALCIFEROL 25 MCG (1000 UT) PO TABS: 50 ug | ORAL | @ 15:00:00 | Stop: 2023-04-13

## 2023-04-11 MED ADMIN — CHOLESTYRAMINE 4 G PO PACK: 4 g | ORAL | @ 02:00:00 | Stop: 2023-04-13

## 2023-04-11 MED ADMIN — HYDROMORPHONE HCL 1 MG/ML IJ SOLN: .6 mg | INTRAVENOUS | @ 06:00:00 | Stop: 2023-04-13 | NDC 00409128331

## 2023-04-11 MED ADMIN — HYDROMORPHONE HCL 1 MG/ML IJ SOLN: .6 mg | INTRAVENOUS | @ 15:00:00 | Stop: 2023-04-15 | NDC 00409128331

## 2023-04-11 MED ADMIN — VITAMIN B-12 500 MCG PO TABS: 1000 ug | ORAL | @ 15:00:00 | Stop: 2023-04-13 | NDC 77333093725

## 2023-04-11 MED ADMIN — OXYCODONE-ACETAMINOPHEN 5-325 MG PO TABS: 1 | ORAL | @ 21:00:00 | Stop: 2023-04-13 | NDC 00904709361

## 2023-04-11 MED ADMIN — OXYCODONE-ACETAMINOPHEN 5-325 MG PO TABS: 1 | ORAL | @ 01:00:00 | Stop: 2023-04-13 | NDC 00406051262

## 2023-04-11 MED ADMIN — FLUTICASONE-SALMETEROL 250-50 MCG/ACT IN AEPB: 1 | RESPIRATORY_TRACT | @ 16:00:00 | Stop: 2023-04-13

## 2023-04-11 MED ADMIN — FLUTICASONE-SALMETEROL 250-50 MCG/ACT IN AEPB: 1 | RESPIRATORY_TRACT | @ 03:00:00 | Stop: 2023-04-13

## 2023-04-11 MED ADMIN — HYDROMORPHONE HCL 1 MG/ML IJ SOLN: .6 mg | INTRAVENOUS | Stop: 2023-04-13 | NDC 00409128331

## 2023-04-11 MED ADMIN — PANTOPRAZOLE SODIUM 40 MG PO TBEC: 40 mg | ORAL | @ 15:00:00 | Stop: 2023-04-13 | NDC 60687073609

## 2023-04-11 MED ADMIN — CHOLESTYRAMINE 4 G PO PACK: 4 g | ORAL | @ 15:00:00 | Stop: 2023-04-13 | NDC 27241013421

## 2023-04-11 MED ADMIN — NICOTINE 7 MG/24HR TD PT24: 7 mg | TRANSDERMAL | @ 01:00:00 | Stop: 2023-05-08 | NDC 00536589488

## 2023-04-11 MED ADMIN — HYDROMORPHONE HCL 1 MG/ML IJ SOLN: .6 mg | INTRAVENOUS | @ 02:00:00 | Stop: 2023-04-13 | NDC 00409128331

## 2023-04-11 MED ADMIN — APIXABAN 5 MG PO TABS: 5 mg | ORAL | @ 03:00:00 | Stop: 2023-04-13 | NDC 00003089431

## 2023-04-11 MED ADMIN — OXYCODONE-ACETAMINOPHEN 5-325 MG PO TABS: 1 | ORAL | @ 17:00:00 | Stop: 2023-04-15 | NDC 00406051262

## 2023-04-11 MED ADMIN — APIXABAN 5 MG PO TABS: 5 mg | ORAL | @ 15:00:00 | Stop: 2023-04-13 | NDC 00003089431

## 2023-04-11 MED ADMIN — OXYCODONE-ACETAMINOPHEN 5-325 MG PO TABS: 1 | ORAL | @ 13:00:00 | Stop: 2023-04-13 | NDC 00406051262

## 2023-04-11 MED ADMIN — CHOLESTYRAMINE 4 G PO PACK: 4 g | ORAL | @ 20:00:00 | Stop: 2023-04-13 | NDC 27241013421

## 2023-04-11 MED ADMIN — PANCRELIPASE (LIP-PROT-AMYL) 36000-114000 UNITS PO CPEP: 36000 [IU] | ORAL | @ 15:00:00 | Stop: 2023-05-10 | NDC 00032301613

## 2023-04-11 MED ADMIN — ONDANSETRON HCL 4 MG PO TABS: 4 mg | ORAL | @ 13:00:00 | Stop: 2023-04-13 | NDC 50268062111

## 2023-04-11 MED ADMIN — HYDROMORPHONE HCL 1 MG/ML IJ SOLN: .6 mg | INTRAVENOUS | @ 10:00:00 | Stop: 2023-04-13 | NDC 00409128331

## 2023-04-11 MED ADMIN — TRAZODONE HCL 25 MG PO TABS: 50 mg | ORAL | @ 03:00:00 | Stop: 2023-04-13

## 2023-04-11 MED ADMIN — QUETIAPINE FUMARATE 50 MG PO TABS: 150 mg | ORAL | @ 15:00:00 | Stop: 2023-04-13 | NDC 00904663961

## 2023-04-11 MED ADMIN — B COMPLEX PO CAPS: 1 | ORAL | @ 15:00:00 | Stop: 2023-04-13

## 2023-04-11 MED ADMIN — OXYCODONE-ACETAMINOPHEN 5-325 MG PO TABS: 1 | ORAL | @ 09:00:00 | Stop: 2023-04-15 | NDC 00406051262

## 2023-04-11 MED ADMIN — LIDOCAINE 5 % EX PTCH: 1 | TRANSDERMAL | Stop: 2023-04-13

## 2023-04-11 MED ADMIN — FLUTICASONE PROPIONATE HFA 220 MCG/ACT IN AERO: 1 | RESPIRATORY_TRACT | @ 16:00:00 | Stop: 2023-04-13 | NDC 66993008096

## 2023-04-11 MED ADMIN — HYDROMORPHONE HCL 1 MG/ML IJ SOLN: .6 mg | INTRAVENOUS | @ 20:00:00 | Stop: 2023-04-13 | NDC 00409128331

## 2023-04-11 NOTE — Progress Notes
Patient transferred off the unit at 1340  Report given to Raquel in [facility]

## 2023-04-11 NOTE — Consults
WEEKEND CASE MANAGER.     1610: CM notified Dr. Ninetta Lights to change observation to inpatient per interqual criteria.    [id number] Adult-Extended Stay       Acute Met   Last Updated by Johnnye Lana on 04/11/2023 1608     Review Status Created By   Completed , Hipolito Bayley      Criteria Review   REVIEW SUMMARY     Patient: Donna Gross, Donna Gross Review Status: Completed  Review Type: Continued Stay  Criteria Status: Acute Met  Condition Specific: Yes        REVIEW OUTCOMES     Primary Outcome: Approved  Date/Time: 04/11/2023 1608  Reviewer: Dagoberto Ligas  Reason: Medically necessary              REVIEW DETAILS     Service Date: 04/11/2023  Product: [id number] Adult  Subset: Extended Stay            [X]  Select Level of Care, One:          [X]  ACUTE, >= One:              [X]  General, One:                  [X]  Partial responder, not clinically stable for discharge and requires continued stay, >= One:                      [X]  Pain uncontrolled and, >= One:                          [X]  Analgesic >= 3x/24h

## 2023-04-11 NOTE — Other
Patient's Clinical Goal:   Clinical Goal(s) for the Shift: vital signs stable, plan of care discussed, free of fall, managed to tolerable pain level  Identify possible barriers to advancing the care plan: n/a  Stability of the patient: Moderately Unstable - medium risk of patient condition declining or worsening    Progression of Patient's Clinical Goal:     Patient remains alert and oriented x4  Vital signs stable: BP 120/85  ~ Pulse 67  ~ Temp 37.1 C (98.7 F) (Temporal)  ~ Resp 18  ~ Wt 50.9 kg (112 lb 3.4 oz)  ~ SpO2 96%  ~ BMI 19.26 kg/m   Pain reports pain being controlled with IV dilaudid and PO Percocet. Per MD continue pain regimen today and possible d/c tomorrow.   Medication administration discussed with patient.   Family at bedside today.   Patient is voiding well, Patient reports having x1 smear, abdomen distended and tenderness with eating, diet advanced today. Patient able to tolerate eating, but having some pain.   Ambulated around unit with steady gait, independent.   Nicotine patch applied. At start of shift, patient did not have a nicotine patch in place.   Patient has call light within reach, fall precautions initiated and discussed. Rounded frequently. Patient verbalizes understanding with teachback. All questions and concerns addressed.  Implemented nursing interventions throughout shift for patient care. Patient with no concerns or questions at this time. Hourly rounding complete to ensure patient safety, comfort and needs are met.     Plan of care:   Pain management.

## 2023-04-12 MED ADMIN — PANCRELIPASE (LIP-PROT-AMYL) 36000-114000 UNITS PO CPEP: 36000 [IU] | ORAL | @ 15:00:00 | Stop: 2023-04-13 | NDC 00032301613

## 2023-04-12 MED ADMIN — VITAMIN B-12 500 MCG PO TABS: 1000 ug | ORAL | @ 15:00:00 | Stop: 2023-04-13 | NDC 50268085415

## 2023-04-12 MED ADMIN — CHOLESTYRAMINE 4 G PO PACK: 4 g | ORAL | @ 15:00:00 | Stop: 2023-04-13 | NDC 27241013421

## 2023-04-12 MED ADMIN — OXYCODONE-ACETAMINOPHEN 5-325 MG PO TABS: 1 | ORAL | @ 13:00:00 | Stop: 2023-04-13 | NDC 00904709361

## 2023-04-12 MED ADMIN — FLUTICASONE PROPIONATE HFA 220 MCG/ACT IN AERO: 1 | RESPIRATORY_TRACT | @ 15:00:00 | Stop: 2023-04-13 | NDC 66993008096

## 2023-04-12 MED ADMIN — CHOLECALCIFEROL 25 MCG (1000 UT) PO TABS: 50 ug | ORAL | @ 15:00:00 | Stop: 2023-04-13

## 2023-04-12 MED ADMIN — ONDANSETRON HCL 4 MG PO TABS: 4 mg | ORAL | @ 19:00:00 | Stop: 2023-04-13

## 2023-04-12 MED ADMIN — B COMPLEX PO CAPS: 1 | ORAL | @ 15:00:00 | Stop: 2023-04-13

## 2023-04-12 MED ADMIN — APIXABAN 5 MG PO TABS: 5 mg | ORAL | @ 04:00:00 | Stop: 2023-04-13 | NDC 00003089431

## 2023-04-12 MED ADMIN — ONDANSETRON HCL 4 MG PO TABS: 4 mg | ORAL | @ 19:00:00 | Stop: 2023-04-13 | NDC 50268062111

## 2023-04-12 MED ADMIN — APIXABAN 5 MG PO TABS: 5 mg | ORAL | @ 15:00:00 | Stop: 2023-04-13 | NDC 00003089431

## 2023-04-12 MED ADMIN — FLUTICASONE-SALMETEROL 250-50 MCG/ACT IN AEPB: 1 | RESPIRATORY_TRACT | @ 04:00:00 | Stop: 2023-04-13 | NDC 00054032756

## 2023-04-12 MED ADMIN — OXYCODONE-ACETAMINOPHEN 5-325 MG PO TABS: 1 | ORAL | @ 18:00:00 | Stop: 2023-04-13 | NDC 00904709361

## 2023-04-12 MED ADMIN — PANCRELIPASE (LIP-PROT-AMYL) 36000-114000 UNITS PO CPEP: 36000 [IU] | ORAL | @ 19:00:00 | Stop: 2023-04-13 | NDC 00032301613

## 2023-04-12 MED ADMIN — OXYCODONE-ACETAMINOPHEN 5-325 MG PO TABS: 1 | ORAL | @ 01:00:00 | Stop: 2023-04-13 | NDC 00904709361

## 2023-04-12 MED ADMIN — PANTOPRAZOLE SODIUM 40 MG PO TBEC: 40 mg | ORAL | @ 15:00:00 | Stop: 2023-04-13 | NDC 60687073609

## 2023-04-12 MED ADMIN — HYDROMORPHONE HCL 1 MG/ML IJ SOLN: .6 mg | INTRAVENOUS | @ 09:00:00 | Stop: 2023-04-13 | NDC 00409128331

## 2023-04-12 MED ADMIN — DULOXETINE HCL 30 MG PO CPEP: 30 mg | ORAL | @ 15:00:00 | Stop: 2023-04-13 | NDC 60687073411

## 2023-04-12 MED ADMIN — FLUTICASONE-SALMETEROL 250-50 MCG/ACT IN AEPB: 1 | RESPIRATORY_TRACT | @ 15:00:00 | Stop: 2023-04-13

## 2023-04-12 MED ADMIN — HYDROMORPHONE HCL 1 MG/ML IJ SOLN: .6 mg | INTRAVENOUS | @ 19:00:00 | Stop: 2023-04-13 | NDC 00409128331

## 2023-04-12 MED ADMIN — OXYCODONE-ACETAMINOPHEN 5-325 MG PO TABS: 1 | ORAL | @ 08:00:00 | Stop: 2023-04-13 | NDC 00904709361

## 2023-04-12 MED ADMIN — NICOTINE 7 MG/24HR TD PT24: 7 mg | TRANSDERMAL | @ 01:00:00 | Stop: 2023-04-13 | NDC 00536589488

## 2023-04-12 MED ADMIN — HYDROMORPHONE HCL 1 MG/ML IJ SOLN: .6 mg | INTRAVENOUS | @ 04:00:00 | Stop: 2023-04-13 | NDC 00409128331

## 2023-04-12 MED ADMIN — CHOLESTYRAMINE 4 G PO PACK: 4 g | ORAL | @ 19:00:00 | Stop: 2023-04-13 | NDC 68382052860

## 2023-04-12 MED ADMIN — QUETIAPINE FUMARATE 50 MG PO TABS: 150 mg | ORAL | @ 04:00:00 | Stop: 2023-04-13 | NDC 00904663961

## 2023-04-12 MED ADMIN — CHOLESTYRAMINE 4 G PO PACK: 4 g | ORAL | @ 01:00:00 | Stop: 2023-04-30

## 2023-04-12 MED ADMIN — QUETIAPINE FUMARATE 50 MG PO TABS: 150 mg | ORAL | @ 17:00:00 | Stop: 2023-04-13 | NDC 00904663961

## 2023-04-12 MED ADMIN — HYOSCYAMINE SULFATE 0.125 MG PO TABS: 125 ug | ORAL | @ 19:00:00 | Stop: 2023-04-13 | NDC 42192034001

## 2023-04-12 MED ADMIN — HYDROMORPHONE HCL 1 MG/ML IJ SOLN: .6 mg | INTRAVENOUS | @ 15:00:00 | Stop: 2023-04-13 | NDC 00409128331

## 2023-04-12 MED ADMIN — PANCRELIPASE (LIP-PROT-AMYL) 36000-114000 UNITS PO CPEP: 36000 [IU] | ORAL | @ 01:00:00 | Stop: 2023-04-13 | NDC 00032301613

## 2023-04-12 MED FILL — ELIQUIS 5 MG PO TABS: 5 mg | ORAL | 30 days supply | Qty: 60 | Fill #0

## 2023-04-12 NOTE — Progress Notes
INTERNAL MEDICINE - INPATIENT PROGRESS NOTE    ADMISSION DATE:     DATE OF SERVICE: 04/11/2023  HOSPITAL DAY: 0    PRINCIPLE PROBLEM: Superior mesenteric artery stenosis (HCC/RAF)    CC: Abdominal Pain (Pt reports abdominal swelling and pain, different than her normal chronic abd pain. Pt c/o nausea and diarrhea, but states this has been ongoing since gallbladder surgery last year. )    INTERVAL EVENTS AND REVIEW OF SYSTEMS:    No acute events overnight  Patient continues to have abdominal discomfort and episodes of diarrhea. Tolerating regular diet.    MEDICATIONS:  apixaban, 5 mg, Oral, BID  cholestyramine, 4 g, Oral, TID w/meals  cyanocobalamin, 1,000 mcg, Oral, Daily  DULoxetine, 30 mg, Oral, Daily  fluticasone, 1 puff, Inhalation, Daily  fluticasone-salmeterol, 1 puff, Inhalation, BID  lidocaine, 1 patch, Transdermal, Q24H  nicotine patch, 7 mg, Transdermal, Daily  pancrelipase (Lip-Prot-Amyl), 36,000 units of lipase, Oral, TID w/meals  pantoprazole, 40 mg, Oral, Daily  QUEtiapine, 150 mg, Oral, BID  vitamin B complex, 1 capsule, Oral, Daily  vitamin D (cholecalciferol), 50 mcg, Oral, Daily  PRNs: acetaminophen, albuterol, calcium carbonate, HYDROmorphone, hyoscyamine, ipratropium-albuterol, ondansetron **OR** ondansetron injection/IVPB, oxyCODONE-acetaminophen, pancrelipase (Lip-Prot-Amyl), prochlorperazine, traZODone    VITALS:  Temp:  [36.3 ?C (97.4 ?F)-37 ?C (98.6 ?F)] 36.3 ?C (97.4 ?F)  Heart Rate:  [63-74] 63  Resp:  [16-20] 17  BP: (83-110)/(57-81) 105/68  NBP Mean:  [69-85] 80  SpO2:  [90 %-97 %] 94 %     Weight: 50.9 kg (112 lb 3.4 oz) Oxygen Therapy  SpO2: 94 %  O2 Device: None (Room air)  Flow Rate (L/min): 2 L/min     IN'S AND OUT'S:  I/O last 2 completed shifts:  In: 480 [P.O.:480]  Out: 300 [Urine:300]    PHYSICAL EXAM:  General: alert, well appearing, and in no distress.   Cardiac: regular rate and rhythm, no murmurs. Normal dorsalis pedal pulses bilateral. No peripheral edema.  Lungs: Clear to auscultation, normal work of breathing  Abdomen: soft, nontender, nondistended  Neuro: fully oriented    DATA  I have reviewed the following information from the last 24 hours: allied health and treating physician notes, imaging, labs and microbiology data, and cardiac studies and telemetry data (if on monitor)                     ASSESSMENT:  Donna Gross is a 57 y.o. 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      #SMA Stenosis, POA Patient with known SMA stenosis sand symptomology c/w chronic mesenteric ischemia. 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 IR evaluated patient found patient would not benefit from endovascular intervention.   #Acute on Chronic Abdominal Pain, POA   -Appreciate IR Vascular Surgery consult and recs:   - s/p therapeutic heparin gtt given thrombosis on CTA within high-grade SMA stenosis; transition to eliquis 5 mg BID now- pending insurance auth  -Pain Management with tylenol, norco, and IV dilaudid   -stop IVF   -cont PPI  -continue home hyoscyamine prn      #Diarrhea- POA, chronic  #hx of pancreatitis  #Severe protein calorie malnutrition, POA  C diff, H pylori, celiac studies, parasite enteric panel negative. EGD without acute source. Patient previously unable to tolerate colonoscopy. Previously seen by GI during  recent hospitalization Outpatient colonoscopy, could consider SIBO testing outpatient.   - Tylenol/Oxy/Dilaudid and lidocaine patch for pain  - Consult to Nutrition Services   - Zofran 4 mg PRN 1st line, compazine 2nd line for nausea   - PPI BID  - resume home pancrelipase (creon) 36,000 u TIDAC while NPO  - resume home cholestyramine 4g TID once diet while NPO  - patient unable to tolerate prep for colonoscopy, will plan for outpatient colo, schedule on dc   - vascular surgery consulted for SMA narrowing as above                  -can stop heparin gtt                 -start eliquis 5 mg BID (4/19-)                  -vascular follow up      #Tobacco Use, POA  - nicotine patch   - counseled about nicotine cessation      Chronic/stable:  #Anxiety  #Major Depression  #Fibromyalgia   #Insomnia  - Continue Seroquel 150mg  BID  - Continue duloxetine 40 mg qday   - Trazodone 50 mg at bedtime PRN       I have seen and examined the patient and agree with the RD assessment detailed below:  Patient meets criteria for: Severe protein calorie malnutrition    (current weight 50.9 kg (112 lb 3.4 oz),  ;  ,  ).   See RD notes for additional details.     INPATIENT CHECKLIST:  Diet regular  VTE Prophylaxis: apixaban  Central Lines: none  Tubes/Drains: none  Activity: as tolerated  Precautions: No infectious isolation,      ADVANCED DIRECTIVES:  Full Code, Primary Emergency Contact: [name],[name]     DISPOSITION:  Observation, Monitored. Expected post-hospitalization disposition will be to home.    AUTHOR:  Chanique Duca M. Ninetta Lights, MD  04/11/2023 at 8:01 PM

## 2023-04-12 NOTE — Other
Patient's Clinical Goal:   Clinical Goal(s) for the Shift: vss, safety, comfort, rest pain management  Identify possible barriers to advancing the care plan:   Stability of the patient: Moderately Stable - low risk of patient condition declining or worsening   Progression of Patient's Clinical Goal:   -Pt A&Ox4, vss, RA, cont pulse ox  -Severe pain  -Tolerating diet, no n/v  -BMAT 4, ambulate to bathroom  -Transfer from TRU  -No acute distress    BP 92/57  ~ Pulse 74  ~ Temp 36.4 C (97.5 F) (Forehead)  ~ Resp 16  ~ Wt 50.9 kg (112 lb 3.4 oz)  ~ SpO2 93%  ~ BMI 19.26 kg/m

## 2023-04-12 NOTE — Other
Patient's Clinical Goal:   Clinical Goal(s) for the Shift: vss, pain magement, rest, comfort and safety  Identify possible barriers to advancing the care plan:   Stability of the patient: Moderately Stable - low risk of patient condition declining or worsening   Progression of Patient's Clinical Goal:   Pt A&O x 4. VSS. On cont pulse ox, RA. PRN pain medications given to manage pain. Tolerated diet. Pt reports passing gas. BMAT 4. Discharge orders in place. Provided discharge instructions including signs and symptoms to report to MD, upcoming appointments, use of medications with verbalization of understanding.  Removed PIV lines. Wheeled to the lobby with all belongings including dc medication accompanied by discharge RN.    BP 121/66  ~ Pulse 65  ~ Temp 36.4 C (97.5 F) (Temporal)  ~ Resp 18  ~ Wt 50.9 kg (112 lb 3.4 oz)  ~ SpO2 94%  ~ BMI 19.26 kg/m

## 2023-04-12 NOTE — Other
Patient's Clinical Goal:   Clinical Goal(s) for the Shift: pain mgmt  Identify possible barriers to advancing the care plan:   Stability of the patient: Moderately Stable - low risk of patient condition declining or worsening   Progression of Patient's Clinical Goal: Pt A&Ox4, calm and cooperative. BMAT of 4. Pt on cpox satting WNL. Pain managed through PRN medications. Pt voids to the bathroom, voiding adequately. Bed locked and in the lowest position, call light within reach, will endorse all updates to oncoming RN.

## 2023-04-12 NOTE — Consults
IP CM ACTIVE DISCHARGE PLANNING  Department of Care Coordination      Admit [id number]  Anticipated Date of Discharge: 04/12/2023    Following YN:[id number], Eulis Canner., MD      Today's short update     04/22 1057: per [facility] IDR: still on pain management. IR consulted no surgical intervention needded. EDD:  02/12/23 No CM needs identified at this time.    Disposition     Home, No needs identified  [address]  [place] [address]       Freedom of Choice     Treatment Preferences: Addressed  Discharge Goals: Addressed                             Leanna Battles, RN BSN 04/12/23  [facility] Inpatient Clinical Case Manager,   Department of Care Coordination and Clinical Social Work  Phone: [phone]       Fax: [phone]        Pager: [phone]

## 2023-04-12 NOTE — Consults
CASE MANAGER ASSESSMENT      Admit [id number]    Date of Initial CM Assessment: 04/12/2023    Problems: Principal Problem:    Superior mesenteric artery stenosis (HCC/RAF) (POA: Yes)  Active Problems:    Abdominal pain (POA: Yes)       Past Medical History:   Diagnosis Date    Anxiety     Depression     Emphysema, unspecified (HCC/RAF)     Emphysema, unspecified (HCC/RAF)     Fibromyalgia     Gastritis     GERD (gastroesophageal reflux disease)     History of blood transfusion     Intractable nausea and vomiting 01/23/2020    Pancreatitis     Past Surgical History:   Procedure Laterality Date    ABDOMINAL SURGERY      COLON SURGERY            Primary Care Physician:Butler, Josem Kaufmann, MD  Phone:[phone]      NEEDS ASSESSMENT:     Primary Living Situation: Lives w/Family    Pre-admission Living Situation: Home/Apartment     Primary Support Systems: Family members    Contact Name: Clemetine Marker (Daughter) Phone Number: [phone] (Mobile)   Does the patient have a Family/Support System member participating in Discharge Planning?: Yes    DPOA?: Yes DPOA Type: Medical     Bathroom on Main Floor: Yes          Prior Treatments / Services: None    Who is your PCP?: Charm Barges, Josem Kaufmann, MD    Do you have your Primary Care Doctor's office number?: Yes    How often do you visit your doctor?: Annual    Do you need information/education regarding your medical condition?: No       Were you hospitalized in the last 30 days?: No    DISCHARGE ASSESSMENT:     Projected Date of Discharge: 04/12/2023    Anticipated Complex D/C?: No    Projected Discharge to: Home    Discharge Address: [address]  [place] [address]    Projected Discharge Needs: None    Freedom of Choice: Educated and Provided           Aidin Choice List: Not Applicable            Support Identified at Discharge: Child  Name of Discharge Support Person: Clemetine Marker (Daughter) Phone Number: [phone] (Mobile)     Who is available to transport you upon discharge?: Family Transportation         SDOH     Within the past 12 months, has a lack of transportation kept you from medical appointments, meetings, work, or from getting things needed for daily living? : No  How hard is it for you to pay for the very basics like food, housing, medical care, and heating?: Not very hard  How hard is it for you to pay for prescriptions or medical bills?: Not very hard  In the past 12 months has the electric, gas, oil, or water company threatened to shut off services in your home?: No  In the last 12 months, was there a time when you were not able to pay the mortgage or rent on time?: No  In the last 12 months, how many places have you lived?: 1  In the last 12 months, was there a time when you did not have a steady place to sleep or slept in a shelter (including now)?: No  Leanna Battles, RN BSN 04/12/23  [facility] Inpatient Clinical Case Manager,   Department of Care Coordination and Clinical Social Work  Phone: [phone]       Fax: [phone]        Pager: [phone]

## 2023-04-13 NOTE — Discharge Summary
Discharge Summary           Name: Donna Gross MRN: [id number] DOB: [birth year]   Admit Date: 04/12/2023   D/C Date: 04/12/2023 LOS:  LOS: 1 day    Admitting Attending Olayinka Gathers M. Ninetta Lights, MD Discharge Attending No att. providers found   PCP Kelle Darting, MD Discharge Provider Dallas Torok M. Ninetta Lights, MD     Inpatient Treatment Team Treatment Team:   Team: K, Hospitalist - Team  Case Manager: Lauree Chandler   Consultants    Outpatient Care Team No care team member to display     Admission Diagnosis (reason for admission) Discharge Diagnosis (conditions treated during hospitalization)   Abdominal Pain (Pt reports abdominal swelling and pain, different than her normal chronic abd pain. Pt c/o nausea and diarrhea, but states this has been ongoing since gallbladder surgery last year. ) Hospital Problems       Current Hospital Problems             POA    * (Principal) Superior mesenteric artery stenosis (HCC/RAF) Yes    Abdominal pain Yes           HPI/ Hospital Course:  57 y.o. female with a history of anxiety, depression, emphysema, fibromyalgia, gastritis, GERD, pancreatitis found to have SMA stenosis. Patient report chronic worsened with PO intake, c/b post-prandial nausea/vomiting and diarrhea since her cholecystectomy 03/2022, requiring 3 prior hospitalizations in the past year with similar presentations. Previously seen by GI, infectious stool studies and EGD unremarkable, unable to tolerate colonoscopy and referred to f/u outpatient. Vascular surgery/ IR consulted, no surgical/procedural intervention recommended at this time, did not think stenosis was cause of the abdominal symptoms. Lactate was trended and remained wnl. Patient initially started on heparin gtt, transitioned to apixaban for trial of anticoagulation. Plan for short f/u outpatient with vascular surgery.    Relevant Imaging Studies:   Last CT Abdomen/Pelvis: CT abd+pelvis angiogram wo+w contrast    Result Date: 04/08/2023  IMPRESSION: High-grade stenosis of the origin of superior mesenteric artery. Clinically correlate for mesenteric angina. No direct CT findings of ischemic enteritis or colitis. Signed by: Carie Caddy   04/08/2023 10:14 AM     Relevant labs:  BMP:   Lab Results   Component Value Date/Time    NA 140 04/09/2023 05:30 AM    K 3.6 04/09/2023 05:30 AM    CL 107 (H) 04/09/2023 05:30 AM    CO2 23 04/09/2023 05:30 AM    BUN 10 04/09/2023 05:30 AM    CREAT 0.95 04/09/2023 05:30 AM    CALCIUM 8.9 04/09/2023 05:30 AM    GLUCOSE 81 04/09/2023 05:30 AM       CBC:   Lab Results   Component Value Date/Time    WBC 8.54 04/09/2023 05:30 AM    HGB 9.3 (L) 04/09/2023 05:30 AM    HCT 30.3 (L) 04/09/2023 05:30 AM    PLT 350 04/09/2023 05:30 AM      Physical Exam: BP 121/66  ~ Pulse 65  ~ Temp 36.4 ?C (97.5 ?F) (Temporal)  ~ Resp 18  ~ Wt 50.9 kg (112 lb 3.4 oz)  ~ SpO2 94%  ~ BMI 19.26 kg/m?     General: alert, well appearing, and in no distress.   Cardiac: regular rate and rhythm, no murmurs. Normal dorsalis pedal pulses bilateral. No peripheral edema.  Lungs: Clear to auscultation, normal work of breathing  Abdomen: soft, nontender, nondistended  Neuro: fully oriented      Pending labs  Unresulted Labs (From admission, onward)      None             Discharge medications High-risk medications      Medication List        START taking these medications      ELIQUIS 5 mg tablet  Generic drug: apixaban  Take 1 tablet (5 mg total) by mouth two (2) times daily.            CONTINUE taking these medications      * albuterol 90 mcg/act inhaler  Commonly known as: Proventil HFA, Ventolin HFA     * albuterol (2.5 mg/109mL) 0.083% nebulizer solution  Commonly known as: Proventil, Ventolin     cholestyramine 4 g/dose powder  Commonly known as: Questran  Take 1 packet (4 g total) by mouth three (3) times daily with meals.     clobetasol 0.05% cream  Commonly known as: Temovate     CREON 36000 units DR capsule  Generic drug: pancrelipase (Lip-Prot-Amyl)     cyanocobalamin 1000 mcg tablet  Commonly known as: Cyanocobalamin     DULoxetine 30 mg DR capsule  Commonly known as: Cymbalta     fluticasone-salmeterol 250-50 mcg/act diskus inhaler  Commonly known as: Advair     hyoscyamine 0.125 mg tablet  Commonly known as: Levsin     ipratropium-albuterol 20-100 mcg/act inhaler  Commonly known as: Respimat     NARCAN 4 mg/0.1 mL nasal spray  Generic drug: naloxone  Call 911. Administer a single spray intranasally into one nostril for opioid overdose. May repeat in 3 minutes if patient is not breathing..     ondansetron ODT 4 mg disintegrating tablet  Commonly known as: Zofran ODT     oxyCODONE-acetaminophen 7.5-325 MG per tablet  Commonly known as: Percocet     pantoprazole 40 mg DR tablet  Commonly known as: Protonix     ranolazine 500 mg 12 hr tablet  Commonly known as: Ranexa     SEROQUEL PO     vitamin B complex capsule     VITAMIN D3 50 mcg (2000 units) Tabs  Generic drug: Cholecalciferol           * This list has 2 medication(s) that are the same as other medications prescribed for you. Read the directions carefully, and ask your doctor or other care provider to review them with you.                   Where to Get Your Medications        These medications were sent to [facility] ([facility]) [phone] [address], [place] [address]      Hours: Mon-Fri 8AM-6PM, Saturdays & Holidays 8AM-5PM (Closed 1PM-2PM for lunch); Closed Sundays Phone: [phone]   ELIQUIS 5 mg tablet      This patient does not have coumadin on their discharge med list    Controlled meds:   Controlled Medications       Opioid Combinations Disp Start End     oxyCODONE-acetaminophen 7.5-325 MG per tablet          Sig - Route: Take 1 tablet by mouth four (4) times daily as needed for Pain. - Oral    Class: Historical Med             Nutrition recommendations & malnutrition assessment   Continue with ordered diet: Blenderized Liquid     - advance to regular as  soon as medically feasible 2. RD to send Boost Breeze TID     3. Vitamin and mineral supplementation    - MVI w/ minerals once PO daily     4. Check vitamin B1, B12, zinc, and vitamin D    - if B1 low, consider IV thiamine 300 mg x3-5 days, then 100 mg thiamine PO x 1 month     5. Monitor I/O, labs/lytes, and weekly weights        Author:  Jeni Salles, RD pager [phone]   04/09/2023 12:50 PM    Level of Malnutrition: Severe protein calorie malnutrition         Discharge diet orders    Regular: all foods are allowed   As directed           Rehab assessment   No PT evaluation this encounter           Discharge activity orders    Okay to be physically active   As directed      When getting up from bed, sit on the side of the bed for 1 minute before   standing up.  Then when you stand up wait a minute before trying to walk.   Taking your time getting up helps prevent dizziness   As directed           Disposition Discharge condition   Home or Self care stable     Requested appointments Scheduled appointments    We will schedule and contact you with follow up appointment(s)   As   directed      Is the patient being discharged to SNF? No  Clinic/Test/Procedure to be scheduled:Vascular Surgery  Doctor Preferences (If applicable): none  Time Frame: 1-2 weeks  Reason for Appointment/Procedure: f/u hospitalization for SMA occulusion    We will schedule and contact you with follow up appointment(s)   As   directed      Is the patient being discharged to SNF? No  Clinic/Test/Procedure to be scheduled:GI  Doctor Preferences (If applicable): none  Time Frame: within 1 month  Reason for Appointment/Procedure: follow up ongoing GI complaints    We will schedule follow up appointment(s) and call you with appointment   time(s)   As directed      Is the patient being discharged to SNF? No  Clinic/Test/Procedure to be scheduled:PMD  Doctor Preferences (If applicable): Kelle Darting, MD  Time Frame: 1 week   Reason for Appointment/Procedure: follow up hospitalization for SMA   Stenosis     No future appointments.     Case Manager/Home Health assessment   Discharge Information  Discharge Address: [address]  [place] [address] (04/09/23 1549)                         Home Health orders        Goals of Care Note (if new for this encounter)         LACE+ Risk Stratification    Total Score Risk Stratification   0-28 Minimal Risk   29-58 Moderate Risk   59-78 High Risk   79-90 Highest Risk     Readmission Score              55 Total Score    15 Urgent Admission    2 Length of Stay    3 ED Visits in Previous 6 Months    35 Charlson  Score (by age & number of urgent admissions)        Criteria that do not apply:    Female Patient    Discharge Institution    Alternative Level of Care Status    Elective Admission in Previous Year              Greater than 30 minutes was spent organizing discharge materials for the patient, coordinating post-discharge transition of care, and counseling patient on new and old medications to continue.    Dynastee Brummell M. Ninetta Lights, MD  04/12/2023 7:55 PM

## 2023-04-14 ENCOUNTER — Telehealth: Payer: PRIVATE HEALTH INSURANCE

## 2023-04-14 NOTE — Telephone Encounter
Hello,   Can pt be accommodated for hospital f/u    Is the patient being discharged to SNF? No  Clinic/Test/Procedure to be scheduled:GI  Doctor Preferences (If applicable): none  Time Frame: within 1 month  Reason for Appointment/Procedure: follow up ongoing GI complaints     FYI-    Discharge Followup Appointment Info      Date of Discharge: 04/12/2023    Discharge Disposition: Home or Self Care   TIme Frame: Within 7 Days    Sched with PCP?: Outside PCP PCP Name: Kelle Darting, MD               Date and Time of appt: 04/19/23  9:15 AM PDT    Was Appt Confirmed?: Neg    Barriers to Scheduling: None    Did you schedule any Specialty visits?: Yes    Additional Appts/Tests/Info   Vascular Surgery-  [phone] spoke to [place] and [place] will contact  pt to accommodate.     GI-  Dr.  Mariam Dollar * pending   [address]   [address]  [phone]    Called pt at [phone] and confirmed.

## 2023-04-15 ENCOUNTER — Ambulatory Visit: Payer: PRIVATE HEALTH INSURANCE

## 2023-04-15 ENCOUNTER — Telehealth: Payer: PRIVATE HEALTH INSURANCE

## 2023-04-15 NOTE — Telephone Encounter
LVM offering 05/03/23 at 3:30pm

## 2023-04-15 NOTE — Telephone Encounter
CONTACTED PT AND LEFT DETAILED V/M REG HOSPITAL F/U APPT. LEFT C/B NUM AND SENT MSG ON MYCHART W/ APPT DETAILS

## 2023-04-16 NOTE — Telephone Encounter
LVM to call me back to schedule hosp follow up.

## 2023-04-19 NOTE — Telephone Encounter
LVM to call me back to schedule hosp follow up.    PCC: if pt calls, pls assist with scheduling on 05/03/23 at 3:30pm.

## 2023-04-20 ENCOUNTER — Ambulatory Visit: Payer: PRIVATE HEALTH INSURANCE | Attending: Vascular Surgery

## 2023-04-20 ENCOUNTER — Telehealth: Payer: PRIVATE HEALTH INSURANCE

## 2023-04-20 NOTE — Telephone Encounter
Patient called to R/S her appointment today with [facility] // was not able to make appt. Do to the doctors availability I was not able to schedule patient // inform patient I would have to send a message to MD's assistant for a sooner then next available appt. For her hospital f/u //  per patient she was not told about her appointment today and that's why she no show //confirm phone # with patient and inform her that voice mails were left last week. Message send to assistant for sooner appointment if possible.

## 2023-04-20 NOTE — Telephone Encounter
Called pt again, pt answered.  Offered to schedule GI appt for hosp follow up on 05/03/23.  Pt stated that she has a GI provider that she's been seeing for a while, and will call them to schedule a visit.  Advised to make sure to schedule a follow up with GI within 38month of discharge date.  Pt expressed understanding.

## 2023-04-23 ENCOUNTER — Inpatient Hospital Stay
Admit: 2023-04-23 | Discharge: 2023-05-01 | Disposition: A | Discharge status: Home Health | Payer: PRIVATE HEALTH INSURANCE | Source: Home / Self Care

## 2023-04-23 ENCOUNTER — Ambulatory Visit: Payer: PRIVATE HEALTH INSURANCE

## 2023-04-23 ENCOUNTER — Inpatient Hospital Stay: Payer: PRIVATE HEALTH INSURANCE

## 2023-04-23 DIAGNOSIS — Z720 Tobacco use: Secondary | ICD-10-CM

## 2023-04-23 DIAGNOSIS — K559 Vascular disorder of intestine, unspecified: Secondary | ICD-10-CM

## 2023-04-23 DIAGNOSIS — R197 Diarrhea, unspecified: Secondary | ICD-10-CM

## 2023-04-23 DIAGNOSIS — R1084 Generalized abdominal pain: Secondary | ICD-10-CM

## 2023-04-23 DIAGNOSIS — Z9889 Other specified postprocedural states: Secondary | ICD-10-CM

## 2023-04-23 DIAGNOSIS — R112 Nausea with vomiting, unspecified: Secondary | ICD-10-CM

## 2023-04-23 LAB — Differential Automated: ABSOLUTE LYMPHOCYTE COUNT: 1.85 10*3/uL (ref 1.30–3.40)

## 2023-04-23 LAB — Albumin: ALBUMIN: 4.3 g/dL (ref 3.9–5.0)

## 2023-04-23 LAB — Alkaline Phosphatase: ALKALINE PHOSPHATASE: 72 U/L (ref 37–113)

## 2023-04-23 LAB — Basic Metabolic Panel
CHLORIDE: 106 mmol/L (ref 96–106)
CREATININE: 0.95 mg/dL (ref 0.60–1.30)

## 2023-04-23 LAB — Amylase: AMYLASE: 113 U/L (ref 31–124)

## 2023-04-23 LAB — Aspartate Aminotransferase: ASPARTATE AMINOTRANSFERASE: 22 U/L (ref 13–62)

## 2023-04-23 LAB — UA,Dipstick,POC: GLUCOSE UR,POC: NEGATIVE (ref 1.005–1.030)

## 2023-04-23 LAB — Alanine Aminotransferase: ALANINE AMINOTRANSFERASE: 9 U/L (ref 8–70)

## 2023-04-23 LAB — Lipase: LIPASE: 19 U/L (ref 13–69)

## 2023-04-23 LAB — Blood Lactate: BLOOD LACTATE: 10 mg/dL (ref 5–18)

## 2023-04-23 LAB — Extra Light Green Top

## 2023-04-23 LAB — Bilirubin,Total: BILIRUBIN,TOTAL: <0.2 mg/dL (ref 0.1–1.2)

## 2023-04-23 LAB — APTT: APTT: 31.1 s (ref 24.4–36.2)

## 2023-04-23 LAB — CBC: HEMOGLOBIN: 9.8 g/dL — ABNORMAL LOW (ref 11.6–15.2)

## 2023-04-23 LAB — Prothrombin Time Panel: INR: 0.9 s (ref 11.5–14.4)

## 2023-04-23 MED ADMIN — OXYCODONE HCL 5 MG PO TABS: 5 mg | ORAL | Stop: 2023-04-24 | NDC 68084035411

## 2023-04-23 MED ADMIN — ONDANSETRON HCL 4 MG/2ML IJ SOLN: 4 mg | INTRAVENOUS | @ 16:00:00 | Stop: 2023-04-23 | NDC 60505613000

## 2023-04-23 MED ADMIN — HYDROMORPHONE HCL 1 MG/ML IJ SOLN: 1 mg | INTRAVENOUS | @ 16:00:00 | Stop: 2023-04-23 | NDC 00409128331

## 2023-04-23 MED ADMIN — IOHEXOL 350 MG/ML IV SOLN: 100 mL | INTRAVENOUS | @ 18:00:00 | Stop: 2023-04-23 | NDC 00407141491

## 2023-04-23 MED ADMIN — SODIUM CHLORIDE 0.9 % IV BOLUS: 1000 mL | INTRAVENOUS | @ 16:00:00 | Stop: 2023-04-23 | NDC 00338004904

## 2023-04-23 MED ADMIN — HYDROMORPHONE HCL 1 MG/ML IJ SOLN: 1 mg | INTRAVENOUS | @ 21:00:00 | Stop: 2023-04-23 | NDC 00409128331

## 2023-04-23 MED ADMIN — ONDANSETRON HCL 4 MG PO TABS: 4 mg | ORAL | Stop: 2023-05-01 | NDC 00904655161

## 2023-04-23 MED ADMIN — HYDROMORPHONE HCL 1 MG/ML IJ SOLN: 1 mg | INTRAVENOUS | @ 19:00:00 | Stop: 2023-04-23 | NDC 00409128331

## 2023-04-23 MED ADMIN — ACETAMINOPHEN 500 MG PO TABS: 1000 mg | ORAL | @ 23:00:00 | Stop: 2023-05-23

## 2023-04-23 NOTE — ED Notes
Report given to [facility], all questions answered. Patient verbalized understanding of moving to a new area in the ED for boarding. This RN moved patient via cart to new area.

## 2023-04-23 NOTE — ED Notes
Patient updated on NPO status and that daughter had called and said she was coming to visit after work. Patient verbalized understanding of plan of care and had no further questions at this time.

## 2023-04-23 NOTE — ED Notes
Patient again requesting pain medications, Provider to be reminded.

## 2023-04-23 NOTE — ED Notes
Patient's daughter, [name], called and with permission this RN updated her on plan of care. Patient given phone to speak to daughter at this time as well. Patient continues to rest in bed, fall precautions in place.

## 2023-04-23 NOTE — ED Provider Notes
[facility]  Emergency Department Service Report    Donna Gross 57 y.o. female , presents with Abdominal Pain      Triage   Arrived on 04/23/2023 at 6:50 AM   Arrived by Walk-in [14]    ED Triage Vitals   Temp Temp Source BP Heart Rate Resp SpO2 O2 Device Pain Score Weight   04/23/23 0653 04/23/23 1610 04/23/23 9604 04/23/23 5409 04/23/23 8119 04/23/23 1478 04/23/23 0653 04/23/23 0655 04/23/23 0656   36.7 ?C (98.1 ?F) Oral 125/74 77 18 96 % None (Room air) Ten 49.1 kg (108 lb 3.9 oz)       Pre hospital care:       Allergies   Allergen Reactions    Hydrocodone Other (See Comments)     ''burns a hole in stomach''  Tolerates hydromorphone    Ibuprofen Other (See Comments)     GI discomfort    ''hurts my stomach''    Morphine Itching     Tolerates hydromorphone       History   HPI     Donna Gross is a 57 y.o. female with past medical history of GERD, pancreatitis, intractable nausea/vomiting, gastritis, status post multiple abdominal surgeries, emphysema, fibromyalgia, anxiety/depression, who presents with abdominal pain x two weeks. Patient denies any alleviating factors; states she has been taking percocet that doctor prescribed but it hasn't provided relief. Patient endorses nausea and one episode of non-bloody vomiting yesterday, as well as non-bloody diarrhea. Patient denies recent antibiotic use. 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 IR was consulted, though no surgical intervention was recommended and patient was discharged with outpatient vascular surgery follow up.         Past Medical History:   Diagnosis Date    Anxiety     Depression     Emphysema, unspecified (HCC/RAF)     Emphysema, unspecified (HCC/RAF)     Fibromyalgia     Gastritis     GERD (gastroesophageal reflux disease)     History of blood transfusion     Intractable nausea and vomiting 01/23/2020    Pancreatitis         Past Surgical History: Procedure Laterality Date    ABDOMINAL SURGERY      COLON SURGERY          Past Family History   family history includes Diabetes in her brother and father; Heart disease in her mother.           Past Social History   she reports that she has been smoking cigarettes. She uses smokeless tobacco. She reports that she does not drink alcohol and does not use drugs. No history on file for sexual activity.       Physical Exam   Physical Exam  Vitals and nursing note reviewed.   Constitutional:       General: She is not in acute distress.     Appearance: Normal appearance.   HENT:      Head: Normocephalic and atraumatic.   Cardiovascular:      Rate and Rhythm: Normal rate and regular rhythm.      Pulses: Normal pulses.   Pulmonary:      Effort: Pulmonary effort is normal. No respiratory distress.   Abdominal:      General: There is no distension.      Palpations: Abdomen is soft.      Tenderness:  There is abdominal tenderness in the epigastric area.      Comments: Multiple healed surgical scars.    Musculoskeletal:         General: No swelling.   Skin:     General: Skin is warm and dry.   Neurological:      Mental Status: She is alert.         ED Course          Laboratory Results     Labs Reviewed   CBC (PERFORMABLE) - Abnormal; Notable for the following components:       Result Value    Red Blood Cell Count 3.75 (*)     Hemoglobin 9.8 (*)     Hematocrit 31.7 (*)     Mean Corpuscular Hemoglobin 26.1 (*)     MCH Concentration 30.9 (*)     Red Cell Distribution Width-SD 49.0 (*)     Red Cell Distribution Width-CV 16.0 (*)     Platelet Count, Auto 455 (*)     Mean Platelet Volume 8.2 (*)     All other components within normal limits   ALT (SGPT) - Normal   AST (SGOT) - Normal   BILIRUBIN,TOTAL - Normal   ALKALINE PHOSPHATASE - Normal   ALBUMIN - Normal   LIPASE - Normal   AMYLASE - Normal   PROTHROMBIN TIME PANEL - Normal   APTT - Normal   LACTATE - Normal   UA,DIPSTICK,POC - Normal   CBC & AUTO DIFFERENTIAL    Narrative: The following orders were created for panel order CBC & Plt & Diff (ED Abdominal Pain/Nausea/Vomiting: Nurse Protocol).  Procedure                               Abnormality         Status                     ---------                               -----------         ------                     ZOX[[id number]]                          Abnormal            Final result               Differential, Automated[[id number]]                          Final result                 Please view results for these tests on the individual orders.   BASIC METABOLIC PANEL   RAINBOW DRAW TO LABORATORY    Narrative:     The following orders were created for panel order [facility] Draw to Laboratory (ED Abdominal Pain/Nausea/Vomiting: Nurse Protocol).  Procedure                               Abnormality         Status                     ---------                               -----------         ------  Extra Burna Mortimer WJX[[id number]]                            Final result                 Please view results for these tests on the individual orders.   DIFFERENTIAL, AUTOMATED (PERFORMABLE)   EXTRA LIGHT GREEN TOP   POCT URINALYSIS DIPSTICK       Imaging Results     CT abd+pelvis angiogram wo+w contrast   Final Result by Ellard Artis., MD (05/03 1753)   IMPRESSION:      No active bleed. Near interval resolution of the previously visualized small volume contrast in the terminal ileum and cecum, which may have been transient hemorrhage due to right sided colitis given proximal colonic wall thickening and mucosal    hyperenhancement and/or mild angiodysplasia given apparent serpiginous appearance of the terminal branch of the ileocolic artery. No bowel ischemia.                 Signed by: Verdell Carmine   04/23/2023 5:53 PM      CT abd+pelvis angiogram wo+w contrast   Final Result by Albin Fischer, MD (05/03 1254)   IMPRESSION:         1. 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 Arteries beyond their origin are patent.   2. Circumferential wall thickening in the ascending and proximal transverse colon, suspicious for colitis which may be ischemic or infectious in etiology.   3. High density foci at the inferior cecum and terminal ileal loops, seen on venous phase imaging and not included in the field-of-view on the noncontrast and arterial phase scans, suspicious for extravasation at the cecum with retrograde flow into    distal ileal loops versus extravasation of the distal ileal loops with some antegrade flow. Consider follow-up CT in 6 to 8 hours and/or IR consultation.      These results were discussed with [facility] R. Latria Mccarron at 04/23/2023 12:51 PM.               Signed by: Albin Fischer   04/23/2023 12:54 PM          Administered Medications     Medication Administration from 04/23/2023 0650 to 04/23/2023 1408         Date/Time Order Dose Route Action Action by Comments     04/23/2023 0905 PDT ondansetron 4 mg/2 mL inj 4 mg 4 mg Intravenous Given Donna Quin, Donna Gross --     04/23/2023 0904 PDT HYDROmorphone 1 mg/mL inj 1 mg 1 mg IV Push Given Donna Quin, Donna Gross --     04/23/2023 1125 PDT sodium chloride 0.9% IV soln bolus 1,000 mL 0 mL Intravenous Stopped Donna Commons, Donna Gross --     04/23/2023 [phone] PDT sodium chloride 0.9% IV soln bolus 1,000 mL 1,000 mL Intravenous New Bag/ Syringe/ Cartridge Donna Mule, Donna Gross --     04/23/2023 1105 PDT iohexol (Omnipaque) 350 mg/mL inj 100 mL 100 mL Intravenous Given Donna Gross --     04/23/2023 1206 PDT HYDROmorphone 1 mg/mL inj 1 mg 1 mg IV Push Given Donna Commons, Donna Gross --            Procedures   Procedural Sedation  Procedures    Medical Decision Making   57 y.o. female  with past medical history of GERD, pancreatitis, intractable nausea/vomiting, gastritis, status post multiple abdominal surgeries, emphysema, fibromyalgia, anxiety/depression, who presents with abdominal pain.    Medical Decision Making  Ddx: PUD, gastritis, pancreatitis, obstruction, mesenteric ischemia. Pt evaluated w/ cbc, cmp, lipase, lactic acid. CT AP. Given pain meds to control sx. LA not elevated to suggest ischemia. Lipase not elevated. CTAP w/ thickening of the colon. Additionally concern for extravasation in venous phase. D/w radiology findings. D/w hospitalist who will admit pt.     Amount and/or Complexity of Data Reviewed  External Data Reviewed: radiology and notes.  Labs: ordered. Decision-making details documented in ED Course.  Radiology: ordered. Decision-making details documented in ED Course.    Risk  Prescription drug management.  Decision regarding hospitalization.      Clinical Impression        1. Generalized abdominal pain    2. H/O abdominal surgery    3. Colitis    4. Small bowel ischemia (HCC/RAF)    5. Diarrhea, unspecified type    6. Nausea and vomiting, unspecified vomiting type    7. Tobacco use          Prescriptions     Current Discharge Medication List          Disposition and Follow-up   Disposition: Observation [10]    Future Appointments   Date Time Provider Department Center   04/26/2023 12:00 PM Rigberg, Cliffton Asters., MD [id number]       Follow up with:  No follow-up provider specified.    Return precautions are specified on After Visit Summary.    Scribe Signature   I, Surveyor, mining, have acted as a Stage manager for patient Evaleigh Mccamy on behalf of Antonietta Breach., MD at 04/23/2023 at 8:48 AM. All documentation underwent a comprehensive review by the listed physician(s) and received their approval upon signing.                Antonietta Breach., MD  04/25/23 [phone]

## 2023-04-23 NOTE — H&P
Inpatient History and Physical    PATIENT:  Donna Gross   MRN:  [id number]  DOB:  April 08, 1966  DATE OF SERVICE:  04/23/2023    ATTENDING PHYSICIAN: Rogue Jury., MD    SENIOR RESIDENT: Yetta Flock, MD    RESIDENT PHYSICIAN: Quayshaun Hubbert C. Steffanie Dunn, MD     PRIMARY CARE PROVIDER: Kelle Darting, MD    REASON FOR ADMISSION:   Chief Complaint   Patient presents with    Abdominal Pain     Pain isnt getting any better I was admitted on the 4/18. I have a lot of pain when I eat. I also feel swollen.       Subjective:     Donna Gross is a 58 y.o. female w/ PMHx pf anxiety, depression, emphysema, fibromyalgia, GERD, SMA stenosis who presents with intractable abdominal pain.    Patient reports that she has been feeling the same diffuse abdominal pain (acute on chronic), nausea/vomiting, and abdominal swelling that she presented with on  04/18 and has several times previous.     Pt reports she does not understand why they discharged her as none of her symptoms had improved Continues with persistent abdominal pain that is always present and will worsen. Notes some triggers include eating and Bms. Continues with nausea and nonbloody/nonbilious emesis, especially postprandially. Also continues with ''the runs.'' Reports loose stools with all bowel movements. States number of episodes varies on PO intake. Denies melena, hematochezia. Today states nothing she does at home helps her pain. The dilaudid she received has helped. Indicates she came to the hospital today because she has lost 4 more pounds since last discharge on 04/22. In total believes    Also denies fevers, chills, CP, SOB, sick contacts, dysuria, hematuria.     Recently admitted 04/18-04/22 for a similar presentation. During this hospitalization underwent CT ab/pelvis angiogram w/ and w/o contrast which demonstrated high grade stenosis of SMA. Vascular and IR consulted who did not feel strongly that this was the source of her abdominal pain and recommended against surgical interventions. She was started on anticoagulation with eliquis BID.     Mhx: anxiety, depression, emphysema, fibromyalgia, GERD, SMA stenosis  Shx: Colectomy for perforated abdomen with colostomy s/p take down (staged op). Cholecystectomy   Rx:   Current Outpatient Medications   Medication Instructions    albuterol (PROVENTIL, VENTOLIN) 2.5 mg, Nebulization, 3 times daily PRN    albuterol 90 mcg/act inhaler 2 puffs, Inhalation, Every 6 hours PRN    B Complex Vitamins (VITAMIN B COMPLEX) capsule 1 capsule, Oral, Daily    cholestyramine (QUESTRAN) 4 g, Oral, 3 times daily with meals    clobetasol 0.05% cream Topical, 3 times daily PRN    cyanocobalamin (CYANOCOBALAMIN) 1,000 mcg, Oral, Daily    DULoxetine (CYMBALTA) 30 mg, Oral, Every other day    ELIQUIS 5 mg, Oral, 2 times daily    fluticasone-salmeterol 250-50 mcg/act diskus inhaler 1 puff, Inhalation, 2 times daily    hyoscyamine (LEVSIN) 125 mcg, Oral, Every 4 hours PRN    ipratropium-albuterol 20-100 mcg/act inhaler 2 puffs, Inhalation, 4 times daily PRN    naloxone 4 mg/0.1 mL nasal spray Call 911. Administer a single spray intranasally into one nostril for opioid overdose. May repeat in 3 minutes if patient is not breathing.    ondansetron ODT (ZOFRAN ODT) 4 mg, Oral, Every 6 hours PRN    oxyCODONE-acetaminophen 7.5-325 MG per tablet 1 tablet, Oral, 4 times daily PRN    pancrelipase,  Lip-Prot-Amyl, (CREON) 36000 units DR capsule 36,000 units of lipase, Oral, 3 times daily with meals    pantoprazole (PROTONIX) 40 mg, Oral, Daily    QUEtiapine Fumarate (SEROQUEL PO) 150 mg, Oral, 2 times daily    ranolazine (RANEXA) 500 mg, Oral, 2 times daily    Vitamin D3 50 mcg, Oral, Daily      Allergies: advil- abdominal pain. Morphine- rash and itching  Fhx: denies pertinent FMHx  Social: lives with da  Tobacco: former ppd smoker from 57 yo- 7 years ago. Reduced use to now 1 cigarette a day  Etoh: denies  Rec drug use: denies      Extended Emergency Contact Information  Primary Emergency Contact: Madilyn Hook [place]  Mobile Phone: [phone]  Relation: Daughter  Secondary Emergency Contact: [name],Kenkisha  Mobile Phone: [phone]  Relation: Daughter  Preferred language: English    Code status: Full code    Review of Systems:  A 14 point review of systems was negative.  Pertinent items are noted in HPI.       ED Course:  - VSS on arrival to ED  - Labs overall unremarkable except for continued low hemoglobin to 9.8 (consistent with previous)  - CTA ab/pelvis with moderate to severe narrowing of the SMA and mild narrowing at celiac. Colitis- ischemic vs. Infectious. And extravasation of contrast in the cecum and ileal loops possible consistent with antegrade flow. Recommend repeat follow up in 6-8 hours.     Initial vitals in the ED were:      ED Triage Vitals   Temp Temp Source BP Heart Rate Resp SpO2 O2 Device Pain Score Weight   04/23/23 0653 04/23/23 3664 04/23/23 4034 04/23/23 7425 04/23/23 9563 04/23/23 8756 04/23/23 0653 04/23/23 0655 04/23/23 0656   36.7 ?C (98.1 ?F) Oral 125/74 77 18 96 % None (Room air) Ten 49.1 kg (108 lb 3.9 oz)       Past Medical History:   Diagnosis Date    Anxiety     Depression     Emphysema, unspecified (HCC/RAF)     Emphysema, unspecified (HCC/RAF)     Fibromyalgia     Gastritis     GERD (gastroesophageal reflux disease)     History of blood transfusion     Intractable nausea and vomiting 01/23/2020    Pancreatitis       Past Surgical History:   Procedure Laterality Date    ABDOMINAL SURGERY      COLON SURGERY        (Not in a hospital admission)    Allergies: Hydrocodone, Ibuprofen, and Morphine  Family History   Problem Relation Age of Onset    Heart disease Mother     Diabetes Brother     Diabetes Father     Anesthesia problems Neg Hx     Malignant hypertension Neg Hx     Hypotension Neg Hx     Malignant hyperthermia Neg Hx     Pseudochol deficiency Neg Hx      Social History     Socioeconomic History Marital status: Single   Tobacco Use    Smoking status: Every Day     Years: 20     Types: Cigarettes    Smokeless tobacco: Current    Tobacco comments:     1 cig per day   Substance and Sexual Activity    Alcohol use: No    Drug use: No   Social History Narrative  Lives w/ mom and daughter     Social Determinants of Health     Financial Resource Strain: Low Risk  (04/12/2023)    Financial Resource Strain     Difficulty of Paying Living Expenses: Not very hard          Objective:     Vital signs in last 24 hours:  Temp:  [36.7 ?C (98.1 ?F)] 36.7 ?C (98.1 ?F)  Heart Rate:  [58-77] 66  Resp:  [16-18] 18  BP: (93-125)/(59-104) 93/59  NBP Mean:  [69-91] 69  SpO2:  [94 %-97 %] 94 %         Most recent blood gas:  No results found for: ''PHART'', ''PCO2ART'', ''PO2ART'', ''BICARBART'', ''BEART''    Intake/Output last 2 shifts:  No intake/output data recorded.    No intake/output data recorded.    Physical Exam:  Constitutional: well appearing, well nourished, NAD  Head: normocephalic, atraumatic, without obvious abnormality   Eyes: PERRL- small pupils, EOMs intact  ENMT: oropharynx clear, dentition appropriate, MMM, no anterior or posterior LAD, no thyromegaly  Cardiovascular: regular rate and rhythm, normal S1/S2, no murmurs/rubs/gallops, pulses 2+, capillary refill <2  Respiratory: normal respiratory effort, clear to auscultation bilaterally, no wheezes/rales/rhonchi/crackles  Gastrointestinal: positive bowel sounds x4q, no masses appreciated, no organomegaly. Guards with palpation. Diffusely TTP. Multiple abdominal surgery scars.   Genitourinary: no CVA tenderness   Musculoskeletal: normal ROM, spine midline, no spinal TTP  Skin: warm, well perfused, pink, turgor normal, no rashes, no lesions  Extremities: no cyanosis, no clubbing, no peripheral edema  Neurologic: A&Ox4, strength and sensation grossly intact  Psychiatric: appropriate mood and affect  Hematologic/lymphatic/immunologic: no bruising, no LAD         Lines and Drains:  Peripheral IV 20 G Left Antecubital (Active)       Laboratory Data:   Lab Results   Component Value Date    WBC 6.87 04/23/2023    HGB 9.8 (L) 04/23/2023    HCT 31.7 (L) 04/23/2023    PLT 455 (H) 04/23/2023     Lab Results   Component Value Date    NA 139 04/23/2023    K 4.3 04/23/2023    CL 106 04/23/2023    CO2 23 04/23/2023    BUN 13 04/23/2023    CREAT 0.95 04/23/2023    GLUCOSE 90 04/23/2023      No results found for: ''GFR''  Lab Results   Component Value Date    CALCIUM 9.5 04/23/2023     Lab Results   Component Value Date    PHOS 4.0 04/08/2023     Lab Results   Component Value Date    MG 1.3 (L) 04/09/2023     Lab Results   Component Value Date    AST 22 04/23/2023    ALT 9 04/23/2023    ALKPHOS 72 04/23/2023    BILITOT <0.2 04/23/2023    BILICON <0.2 01/26/2023    TOTPRO 7.4 04/08/2023    ALBUMIN 4.3 04/23/2023     Lab Results   Component Value Date    AMYLASE 113 04/23/2023    LIPASE 19 04/23/2023     No results for input(s): ''TROPONIN'', ''BNP'' in the last 72 hours.  Recent Labs     04/23/23  0856   PT 12.6   INR 0.9   APTT 31.1     No results for input(s): ''TSH'', ''HGBA1C'' in the last 72 hours.  No results for input(s): ''CHOL'', ''CHOLHDL'', ''CHOLDLCAL'', ''CHOLDLQ'', ''TRIGLY''  in the last 72 hours.      Microbiology:  @LASTPROC (LAB347])@  No results for input(s): ''BACULBLD'', ''BACULUR'', ''BACULRSP'' in the last 72 hours.    Other Studies:   CT abd+pelvis angiogram wo+w contrast   Final Result by Albin Fischer, MD (05/03 1254)   IMPRESSION:         1. 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 Arteries beyond their origin are patent.   2. Circumferential wall thickening in the ascending and proximal transverse colon, suspicious for colitis which may be ischemic or infectious in etiology.   3. High density foci at the inferior cecum and terminal ileal loops, seen on venous phase imaging and not included in the field-of-view on the noncontrast and arterial phase scans, suspicious for extravasation at the cecum with retrograde flow into    distal ileal loops versus extravasation of the distal ileal loops with some antegrade flow. Consider follow-up CT in 6 to 8 hours and/or IR consultation.      These results were discussed with [name]. LANDEFELD at 04/23/2023 12:51 PM.               Signed by: Albin Fischer   04/23/2023 12:54 PM        EKG: pending eval    Assessment & Plan:   Donna Gross is a 57 y.o. female with PMHx pf anxiety, depression, emphysema, fibromyalgia, GERD, SMA stenosis who presents with intractable abdominal pain.    #Acute on chronic abdominal pain, POA  #SMA stenosis, chronic, POA  #Celiac stenosis, POA  #Colitis, POA, acute  Pt been suffering with abdominal pain for approx. 1 year. Has been admitted several times previously with similar complaints. Recently admitted for similar presentation but CT imaging now with evidence of CT extravasation in cecum and ileum c/f possible worsening ischemia. In addition SMA further stenosis and now mild celiac stenosis in addition. Previously evaluated by vascular surgery and IR for interventions but those teams not favorable to SMA as culprit of pain given negative lactate. New CT findings raise greater c/f SMA occlusion complications. While lactate remains negative, query intermittent elevation not appreciated on our lab collections. Patients symptoms fit with SMA syndrome- worsening postprandial symptoms, food aversion leading to acute refusal, and abdominal pain. Radiology recommends rapid interval f/u CT, will eval and consult specialists for possible interventions/further workup.   -repeat CTA abdomen pelvis 6-8 hours from previous  -Consult general surgery for bowel ischemia  -Consult IR for possible vessel stenting in AM   -Pain management with tylneol ATC, oxycodone ss 2.5/5, dilaudid IV PRN  -pantoprazole 40 mg daily   -hold home eliquis, begin heparin drip  -NPO for bowel rest and   -daily lactate   -consider colonoscopy     #Poor PO intake, chronic, POA  #Severe protein malnutrition, POA  Previously evaluated by nutrition who had recommended that pt receive boost TID for assistance with malnutrition likely 2/2 poor PO intake from abdominal pain. Pt currently NPO given concern for above. Reports weight loss of approx 50lbs in the past year. Poor appetite 2/2 pain.   -Restart diet when able  -nutrition consultation  -f/u vitamins: folate, b12, thiamine    #Diarrhea, chronic, POA  #Nausea/Vomiting, chronic, POA  Pt with chronic nausea/vomiting/diarrhea over last year. So far work up has included negative lab studies: c.diff, celiac panel, h.pylori, parasitic enteric panel. Pt unable to tolerate colonoscopy and negative  EGD. Given CT findings of colitis could query infectious etiology but lack of fever, leukocytosis, and sick contacts with fairly recent workup while with current symptoms negative point against. Query poorly controlled GERD. Also consider pain contributing n/v.   -continue home hyoscyamine 125 mcg q4hrs PRN  -zofran 4mg  q6hrs  -EKG to eval Qtc  -Consider stool studies if infectious signs arise (fever, leukocytosis)    #Normocytic anemia  Pt presents with hgb of 9.8 c/w her baseline of 9-10. Query anemia 2/2 to nutritional deficincies from poor PO intake. From chart review, no recent vitamin or iron studies. Will order to further characterize. Denies SS of bleeding.   -f/u folate, B12  -f/u iron studies and ferritin  -daily cbc    #Tobacco use, POA  Previous PPD smoker from age 77 and reduced over last 7 years to now 1 cigarette all day. Requests nicotine patch while IP.   -nicotine patch  -counseled on nicotine cessation    #Anxiety, chronic, POA  #Major Depression, chronic, POA  #Fibromyalgia, chronic, POA   #Insomnia, chronic, POA  Chronic conditions. Will continue home medications.   - Continue Seroquel 150mg  BID  - Continue duloxetine 40 mg qday - Trazodone 50 mg at bedtime PRN    Inpatient Checklist:  #Skin: skin check per nursing  Diet:  NPO for bowel rest  GI ppx: NI  Thromboembolic ppx:  sequential compression devices (SCDs)  Access: piv  Precautions: No infectious isolation, standard precautions.    #Social: Lives in apartment with mother and daughter  Extended Emergency Contact Information  Primary Emergency Contact: Madilyn Hook [place] of [place]  Mobile Phone: [phone]  Relation: Daughter  Secondary Emergency Contact: [name],Kenkisha  Mobile Phone: [phone]  Relation: Daughter  Preferred language: English      #Code Status: Full Code    #Dispo: admit for further workup of abdominal pain and possible surgical/IR intervention for ischemic colitis 2/2 SMA and celiac stenosis    Discussed with attending, Rogue Jury., MD., who agrees with the plan above.    Author:  Christoper Allegra. Steffanie Dunn, MD 04/23/2023 2:03 PM   [facility], PGY1

## 2023-04-23 NOTE — ED Notes
Report to Rachel RN

## 2023-04-23 NOTE — ED Notes
Patient ambulated to bathroom with a slow, steady gait, requesting more pain medications, MD aware.

## 2023-04-24 LAB — Iron & Iron Binding Capacity: % SATURATION: 11 (ref 41–179)

## 2023-04-24 LAB — Blood Lactate: BLOOD LACTATE: 12 mg/dL (ref 5–18)

## 2023-04-24 LAB — Phosphorus
PHOSPHORUS: 3.8 mg/dL (ref 2.3–4.4)
PHOSPHORUS: 4.3 mg/dL (ref 2.3–4.4)
PHOSPHORUS: 4.3 mg/dL (ref 2.3–4.4)

## 2023-04-24 LAB — Ferritin: FERRITIN: 27 ng/mL (ref 8–180)

## 2023-04-24 LAB — Unfractionated Heparin, by anti-Xa
HEPARIN UNFRACTIONATED: <0.1 [IU]/mL
HEPARIN UNFRACTIONATED: 0.47 [IU]/mL
HEPARIN UNFRACTIONATED: 0.55 [IU]/mL

## 2023-04-24 LAB — Basic Metabolic Panel
CALCIUM: 8.8 mg/dL (ref 8.6–10.4)
CALCIUM: 9 mg/dL (ref 8.6–10.4)
POTASSIUM: 4.2 mmol/L (ref 3.6–5.3)
TOTAL CO2: 23 mmol/L (ref 20–30)

## 2023-04-24 LAB — Folate,Serum: FOLATE,SERUM: 21.8 ng/mL (ref 8.1–30.4)

## 2023-04-24 LAB — Prealbumin: PREALBUMIN: 24.4 mg/dL (ref 14.0–40.0)

## 2023-04-24 LAB — Sepsis Lactate Protocol: BLOOD LACTATE: 31 mg/dL — ABNORMAL HIGH (ref 5–18)

## 2023-04-24 LAB — Magnesium
MAGNESIUM: 1.3 meq/L — ABNORMAL LOW (ref 1.4–1.9)
MAGNESIUM: 2.4 meq/L — ABNORMAL HIGH (ref 1.4–1.9)

## 2023-04-24 LAB — Comprehensive Metabolic Panel
CHLORIDE: 105 mmol/L (ref 96–106)
CREATININE: 0.94 mg/dL (ref 0.60–1.30)

## 2023-04-24 LAB — CBC: MCH CONCENTRATION: 31.5 g/dL (ref 31.5–35.5)

## 2023-04-24 LAB — Vitamin B12: VITAMIN B12: 271 pg/mL (ref 254–1060)

## 2023-04-24 LAB — Differential Automated: ABSOLUTE BASO COUNT: 0.03 10*3/uL (ref 0.00–0.10)

## 2023-04-24 LAB — Vitamin D,25-Hydroxy: VITAMIN D,25-HYDROXY: 17 ng/mL — ABNORMAL LOW (ref 20–50)

## 2023-04-24 LAB — Sepsis Lactate Repeat: BLOOD LACTATE: 16 mg/dL (ref 5–18)

## 2023-04-24 MED ADMIN — NICOTINE 7 MG/24HR TD PT24: 7 mg | TRANSDERMAL | @ 15:00:00 | Stop: 2023-05-24 | NDC 00536589488

## 2023-04-24 MED ADMIN — LACTATED RINGERS IV SOLN: 75 mL/h | INTRAVENOUS | @ 22:00:00 | Stop: 2023-04-25 | NDC 00338011704

## 2023-04-24 MED ADMIN — HEPARIN (PORCINE) IN NACL 25000-0.45 UT/250ML-% IV SOLN: 850 [IU]/h | INTRAVENOUS | @ 03:00:00 | Stop: 2023-05-24 | NDC 63323051701

## 2023-04-24 MED ADMIN — ACETAMINOPHEN 500 MG PO TABS: 1000 mg | ORAL | @ 23:00:00 | Stop: 2023-05-01

## 2023-04-24 MED ADMIN — LACTATED RINGERS IV BOLUS: 1000 mL | INTRAVENOUS | @ 01:00:00 | Stop: 2023-04-24 | NDC 00338011704

## 2023-04-24 MED ADMIN — LACTATED RINGERS IV BOLUS: 1000 mL | INTRAVENOUS | @ 03:00:00 | Stop: 2023-04-24 | NDC 00338011704

## 2023-04-24 MED ADMIN — VITAMIN B-12 500 MCG PO TABS: 1000 ug | ORAL | @ 15:00:00 | Stop: 2023-04-24 | NDC 50268085415

## 2023-04-24 MED ADMIN — CHOLESTYRAMINE 4 G PO PACK: 4 g | ORAL | @ 12:00:00 | Stop: 2023-05-23 | NDC 68382052860

## 2023-04-24 MED ADMIN — OXYCODONE HCL 5 MG PO TABS: 5 mg | ORAL | @ 10:00:00 | Stop: 2023-04-24 | NDC 68084035411

## 2023-04-24 MED ADMIN — B COMPLEX PO CAPS: 1 | ORAL | @ 15:00:00 | Stop: 2023-05-24

## 2023-04-24 MED ADMIN — CHOLESTYRAMINE 4 G PO PACK: 4 g | ORAL | @ 01:00:00 | Stop: 2023-05-23

## 2023-04-24 MED ADMIN — OXYCODONE HCL 5 MG PO TABS: 7.5 mg | ORAL | @ 21:00:00 | Stop: 2023-04-30 | NDC 68084035411

## 2023-04-24 MED ADMIN — GABAPENTIN 100 MG PO CAPS: 100 mg | ORAL | @ 20:00:00 | Stop: 2023-04-25

## 2023-04-24 MED ADMIN — MAGNESIUM SULFATE 4 GM/100ML IV SOLN: 4 g | INTRAVENOUS | @ 15:00:00 | Stop: 2023-04-24 | NDC 00409672923

## 2023-04-24 MED ADMIN — OXYCODONE HCL 5 MG PO TABS: 5 mg | ORAL | @ 15:00:00 | Stop: 2023-04-24 | NDC 68084035411

## 2023-04-24 MED ADMIN — RANOLAZINE ER 500 MG PO TB12: 500 mg | ORAL | @ 04:00:00 | Stop: 2023-05-01 | NDC 60687054911

## 2023-04-24 MED ADMIN — PANTOPRAZOLE SODIUM 40 MG PO TBEC: 40 mg | ORAL | @ 03:00:00 | Stop: 2023-05-01 | NDC 50268063911

## 2023-04-24 MED ADMIN — THIAMINE HCL 100 MG PO TABS (MULTI GPI): 100 mg | ORAL | @ 21:00:00 | Stop: 2023-05-01 | NDC 77333093425

## 2023-04-24 MED ADMIN — ACETAMINOPHEN 500 MG PO TABS: 1000 mg | ORAL | @ 07:00:00 | Stop: 2023-05-01

## 2023-04-24 MED ADMIN — ZZ IMS TEMPLATE: 150 mg | ORAL | @ 15:00:00 | Stop: 2023-05-24 | NDC 60687034911

## 2023-04-24 MED ADMIN — PANCRELIPASE (LIP-PROT-AMYL) 36000-114000 UNITS PO CPEP: 36000 [IU] | ORAL | @ 15:00:00 | Stop: 2023-05-24 | NDC 00032301613

## 2023-04-24 MED ADMIN — PANCRELIPASE (LIP-PROT-AMYL) 36000-114000 UNITS PO CPEP: 36000 [IU] | ORAL | @ 01:00:00 | Stop: 2023-05-24

## 2023-04-24 MED ADMIN — ZZ IMS TEMPLATE: 150 mg | ORAL | @ 04:00:00 | Stop: 2023-05-01 | NDC 60687034911

## 2023-04-24 MED ADMIN — CHOLECALCIFEROL 25 MCG (1000 UT) PO TABS: 50 ug | ORAL | @ 15:00:00 | Stop: 2023-05-01

## 2023-04-24 MED ADMIN — RANOLAZINE ER 500 MG PO TB12: 500 mg | ORAL | @ 15:00:00 | Stop: 2023-05-01 | NDC 60687054911

## 2023-04-24 MED ADMIN — LACTATED RINGERS IV SOLN: 75 mL/h | INTRAVENOUS | Stop: 2023-04-25

## 2023-04-24 MED ADMIN — IPRATROPIUM-ALBUTEROL 20-100 MCG/ACT IN AERS: 2 | RESPIRATORY_TRACT | @ 16:00:00 | Stop: 2023-05-23 | NDC 00597002402

## 2023-04-24 MED ADMIN — HEPARIN (PORCINE) IN NACL 25000-0.45 UT/250ML-% IV SOLN: 800 [IU]/h | INTRAVENOUS | @ 05:00:00 | Stop: 2023-04-27

## 2023-04-24 MED ADMIN — HYDROMORPHONE HCL 1 MG/ML IJ SOLN: 1 mg | INTRAVENOUS | @ 15:00:00 | Stop: 2023-04-24 | NDC 00409128331

## 2023-04-24 MED ADMIN — HYDROMORPHONE HCL 1 MG/ML IJ SOLN: 1 mg | INTRAVENOUS | @ 01:00:00 | Stop: 2023-04-24 | NDC 00409128331

## 2023-04-24 MED ADMIN — OXYCODONE HCL 5 MG PO TABS: 5 mg | ORAL | @ 04:00:00 | Stop: 2023-04-24 | NDC 68084035411

## 2023-04-24 MED ADMIN — CHOLESTYRAMINE 4 G PO PACK: 4 g | ORAL | @ 20:00:00 | Stop: 2023-05-23

## 2023-04-24 MED ADMIN — PANCRELIPASE (LIP-PROT-AMYL) 36000-114000 UNITS PO CPEP: 36000 [IU] | ORAL | @ 21:00:00 | Stop: 2023-05-24 | NDC 00032301613

## 2023-04-24 MED ADMIN — FLUTICASONE-SALMETEROL 250-50 MCG/ACT IN AEPB: 1 | RESPIRATORY_TRACT | @ 16:00:00 | Stop: 2023-05-01

## 2023-04-24 MED ADMIN — HYDROMORPHONE HCL 1 MG/ML IJ SOLN: 1 mg | INTRAVENOUS | @ 11:00:00 | Stop: 2023-04-24 | NDC 00409128331

## 2023-04-24 MED ADMIN — TRAZODONE HCL 25 MG PO TABS: 50 mg | ORAL | @ 04:00:00 | Stop: 2023-05-23

## 2023-04-24 MED ADMIN — IOHEXOL 350 MG/ML IV SOLN: 100 mL | INTRAVENOUS | Stop: 2023-04-24 | NDC 00407141491

## 2023-04-24 MED ADMIN — MAGNESIUM SULFATE 2 GM/50ML IV SOLN: 2 g | INTRAVENOUS | @ 17:00:00 | Stop: 2023-04-24 | NDC 25021061281

## 2023-04-24 MED ADMIN — PANTOPRAZOLE SODIUM 40 MG PO TBEC: 40 mg | ORAL | @ 15:00:00 | Stop: 2023-05-01 | NDC 60687073609

## 2023-04-24 MED ADMIN — SODIUM CHLORIDE 0.9% IV SOLN (250 ML): 10 mL/h | INTRAVENOUS | @ 15:00:00 | Stop: 2023-05-01 | NDC 00338004902

## 2023-04-24 MED ADMIN — FLUTICASONE-SALMETEROL 250-50 MCG/ACT IN AEPB: 1 | RESPIRATORY_TRACT | @ 16:00:00 | Stop: 2023-05-01 | NDC 00378932132

## 2023-04-24 MED ADMIN — DULOXETINE HCL 30 MG PO CPEP: 30 mg | ORAL | @ 15:00:00 | Stop: 2023-04-24 | NDC 60687073411

## 2023-04-24 MED ADMIN — LACTATED RINGERS IV BOLUS: 1000 mL | INTRAVENOUS | @ 19:00:00 | Stop: 2023-04-24 | NDC 00338011704

## 2023-04-24 MED ADMIN — ACETAMINOPHEN 500 MG PO TABS: 1000 mg | ORAL | @ 16:00:00 | Stop: 2023-05-01

## 2023-04-24 MED ADMIN — LACTATED RINGERS IV BOLUS: 1000 mL | INTRAVENOUS | @ 21:00:00 | Stop: 2023-04-24 | NDC 00338011704

## 2023-04-24 NOTE — Other
Patient's Clinical Goal:   Clinical Goal(s) for the Shift: pain control, NPO, comfort, safety, monitor for S/Sx infection  Identify possible barriers to advancing the care plan: none  Stability of the patient: Moderately Stable - low risk of patient condition declining or worsening   Progression of Patient's Clinical Goal:     - patient A & O x 4.  - patient c/o constant ABD pain; she has oxycodone and dilaudid IVP PRN.  - non-tele; only continuous SpO2.  - new IV started to right FA #20.  - patient has order for heparin infusion, pharmacy to dose.  Anti-Xa completed and in EPIC.  - CT ABD completed at 1100 in main ER; repeat completed at 1700 when patient admitted to [facility].  - general surgery consulted; and rounded on patient.  - no acute distress noted; all VSS.

## 2023-04-24 NOTE — Consults
GENERAL SURGERY CONSULTATION     PATIENT: Donna Gross  MRN: [id number]  DOB: July 28, 1966  DATE OF SERVICE: 04/24/2023     PRIMARY CARE PROVIDER: Kelle Darting, MD    Reason for Consultation: Abdominal Pain     Subjective:     History of Present Illness:  [name] is a 57 y.o. female w/ PMHx pf anxiety, depression, emphysema, active smoker (1 cig per day), fibromyalgia, GERD, SMA stenosis who presents to the ED with an acute abdominal pain on a chronic abdominal pain.   The patient has a known SMA stenosis with chronic symptoms (~1 yr) of post-prandial emesis (1-2x per week) of undigested food, abdominal discomfort, and post-prandial diarrhea. Patient reports symptoms began 1 year ago ''after surgery''. 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 Most recent operation was lap chole in 03/2022 c/b subhepatic abscess requiring IR drainage and bile leak requiring ERCP/CBD stent.     She was recently admitted 04/18-04/22 for a similar presentation. During this hospitalization underwent CT ab/pelvis angiogram w/ and w/o contrast which demonstrated high grade stenosis of SMA.     IR and  consulted and did not feel strongly that this was the source of her abdominal pain and recommended against a stenting revascularization as the SMA narrowing is similar to prior studies. Given normal size and patency of the IMA and CA, IR was convinced the SMA narrowing is responsible for the patient's abdominal pain, and therefore stenting would not be beneficial.   The [place] abdo performed on 04/08/2023:   Patent celiac artery and superior mesenteric artery with borderline, approximately 50%, stenosis of the superior mesenteric artery at the origin.     She was started on anticoagulation with eliquis BID during the last hospitalization.    Patient came to the ED today as she reported having an acute diffuse abdominal pain today worse than her usual pain, Had 2 episodes of non-bloody emesis yesterday, and loose stools this morning.   Patients also reports that she is feeling that her belly is swollen.   Denies melena, hematochezia.   Also denies fevers, chills, CP, SOB, sick contacts, dysuria, hematuria.     04/24/2023: AFVSS, pt is still complaining of the same pain. Abdomen still soft and NT today. Is tolerating food. BM *2, passing gas     Past Medical History:   Past Medical History:   Diagnosis Date    Anxiety     Depression     Emphysema, unspecified (HCC/RAF)     Emphysema, unspecified (HCC/RAF)     Fibromyalgia     Gastritis     GERD (gastroesophageal reflux disease)     History of blood transfusion     Intractable nausea and vomiting 01/23/2020    Pancreatitis        Past Surgical History:   Past Surgical History:   Procedure Laterality Date    ABDOMINAL SURGERY      COLON SURGERY         Scheduled Meds:  Current Facility-Administered Medications   Medication Dose Route Frequency    acetaminophen tab 1,000 mg  1,000 mg Oral Q8H    albuterol 90 mcg/act inh 2 puff  2 puff Inhalation Q6H PRN    atorvastatin tab 40 mg  40 mg Oral QHS  cholestyramine pwd packet 4 g  4 g Oral TID AC    cyanocobalamin tab 1,000 mcg  1,000 mcg Oral Daily    [START ON 04/25/2023] DULoxetine DR cap 30 mg  30 mg Oral Daily    fluticasone-salmeterol 250-50 mcg/act diskus inhaler 1 puff  1 puff Inhalation BID    gabapentin cap 100 mg  100 mg Oral TID    heparin 25,000 units in 0.45% NaCl 250 mL drip RTU  850 Units/hr Intravenous Continuous    heparin per pharmacy   Does not apply Continuous    [COMPLETED] HYDROmorphone 1 mg/mL inj 1 mg  1 mg IV Push STAT    [COMPLETED] HYDROmorphone 1 mg/mL inj 1 mg  1 mg IV Push STAT    HYDROmorphone 1 mg/mL inj 1 mg  1 mg IV Push Q4H PRN    hyoscyamine tab 125 mcg  125 mcg Oral Q4H PRN    [COMPLETED] iohexol (Omnipaque) 350 mg/mL inj 100 mL  100 mL Intravenous Once    [COMPLETED] iohexol (Omnipaque) 350 mg/mL inj 100 mL  100 mL Intravenous Once    ipratropium-albuterol 20-100 mcg/act inh 2 puff  2 puff Inhalation QID PRN    [COMPLETED] lactated ringers IV soln bolus 1,000 mL  1,000 mL Intravenous Once    [COMPLETED] lactated ringers IV soln bolus 1,000 mL  1,000 mL Intravenous Once    magnesium sulfate 2 g in water for injection 50 mL RTU  2 g Intravenous Once    [COMPLETED] magnesium sulfate 4 g in water for injection 100 mL RTU  4 g Intravenous Once    nicotine 7 mg/24 hr patch 7 mg  7 mg Transdermal Daily    ondansetron tab 4 mg  4 mg Oral Q6H PRN    oxyCODONE tab 5 mg  5 mg Oral Q4H PRN    oxyCODONE tab 7.5 mg  7.5 mg Oral Q4H PRN    pancrelipase (Lip-Prot-Amyl) (Creon) DR cap 36,000 units of lipase  36,000 units of lipase Oral TID w/meals    pantoprazole DR tab 40 mg  40 mg Oral BID    QUEtiapine tab 150 mg  150 mg Oral BID    ranolazine tab ER12 500 mg  500 mg Oral BID    [COMPLETED] sodium chloride 0.9% IV soln bolus 1,000 mL  1,000 mL Intravenous Once    sodium chloride 0.9% IV soln  10 mL/hr Intravenous PRN    traZODone tab 50 mg  50 mg Oral QHS PRN    vitamin B complex cap 1 capsule  1 capsule Oral Daily    vitamin D (cholecalciferol) tab 50 mcg  50 mcg Oral Daily    [DISCONTINUED] DULoxetine DR cap 30 mg  30 mg Oral Every Other Day    [DISCONTINUED] gabapentin cap 100 mg  100 mg Oral QHS    [DISCONTINUED] oxyCODONE tab 2.5 mg  2.5 mg Oral Q4H PRN    [DISCONTINUED] oxyCODONE tab 5 mg  5 mg Oral Q4H PRN    [DISCONTINUED] pantoprazole DR tab 40 mg  40 mg Oral Daily       Allergies: Hydrocodone, Ibuprofen, and Morphine    Family History:   Family History   Problem Relation Age of Onset    Heart disease Mother     Diabetes Brother     Diabetes Father     Anesthesia problems Neg Hx     Malignant hypertension Neg Hx     Hypotension Neg Hx  Malignant hyperthermia Neg Hx     Pseudochol deficiency Neg Hx Social History:   Social History     Socioeconomic History    Marital status: Single   Tobacco Use    Smoking status: Every Day     Years: 20     Types: Cigarettes    Smokeless tobacco: Current    Tobacco comments:     1 cig per day   Substance and Sexual Activity    Alcohol use: No    Drug use: No   Social History Narrative    Lives w/ mom and daughter     Social Determinants of Health     Financial Resource Strain: Low Risk  (04/12/2023)    Financial Resource Strain     Difficulty of Paying Living Expenses: Not very hard       Objective:     Physical Exam:  Last Recorded Vital Signs:    04/24/23 0756   BP: 109/74   Pulse: 79   Resp: 18   Temp: 36.7 ?C (98 ?F)   SpO2: 96%     Body mass index is 18.53 kg/m?Marland Kitchen   Vitals:    04/23/23 1654   Weight: 49 kg (108 lb)   Height: 1.626 m (5' 4.02'')       Vitals:    04/23/23 1654   Weight: 49 kg (108 lb)   Height: 1.626 m (5' 4.02'')     General: The pateint is a well-appearing, well-nourished, in no acute distress.The patient is awake, alert, oriented, and answers questions appropriately.   Head: Normocephalic, atraumatic.   Eyes: Extraocular movements are intact. There is no scleral icterus.   Heart: SR  Chest:  No use of accessory muscle  Abdomen: Soft and non-tender.  Extremities:  No clubbing, cyanosis, or edema.    Laboratory Review:  Lab Results   Component Value Date    WBC 7.83 04/24/2023    HGB 9.3 (L) 04/24/2023    HCT 29.5 (L) 04/24/2023    MCV 85.0 04/24/2023    PLT 402 (H) 04/24/2023     Recent Results (from the past 24 hour(s))   UA,Dipstick,POC    Collection Time: 04/23/23 12:10 PM   Result Value Ref Range    Specific Gravity 1.015 1.005 - 1.030    pH 6.0 5.0 - 8.0    Blood Negative Negative    Bilirubin Negative Negative    Ketone Negative Negative    Glucose Negative Negative    Protein Negative Negative    Nitrite Negative Negative    Leukocyte Esterase Negative Negative   ECG 12 lead    Collection Time: 04/23/23  3:56 PM   Result Value Ref Range Ventricular Rate 55 BPM    Atrial Rate 54 BPM    P-R Interval 112 ms    QRS Duration 86 ms    Q-T Interval 428 ms    QTC Calculation (Bezet) 410 ms    P Axis 90 degrees    R Axis 78 degrees    T Axis 76 degrees    Diagnosis Sinus rhythm     Diagnosis Borderline short PR interval     Diagnosis Consider right atrial enlargement     Diagnosis 12 Lead; Mason-Likar     Diagnosis Otherwise Normal ECG    Unfractionated Heparin (UFH), by anti-Xa    Collection Time: 04/23/23  5:35 PM   Result Value Ref Range    Anti-Xa, UFH <0.10   IU/mL   Unfractionated  Heparin (UFH), by anti-Xa    Collection Time: 04/24/23  3:01 AM   Result Value Ref Range    Anti-Xa, UFH 0.47   IU/mL   Folate,Serum    Collection Time: 04/24/23  3:01 AM   Result Value Ref Range    Folate,Serum 21.8 8.1 - 30.4 ng/mL   Prealbumin    Collection Time: 04/24/23  3:01 AM   Result Value Ref Range    Prealbumin 24.4 14.0 - 40.0 mg/dL   Basic Metabolic Panel    Collection Time: 04/24/23  3:01 AM   Result Value Ref Range    Sodium 139 135 - 146 mmol/L    Potassium 4.2 3.6 - 5.3 mmol/L    Chloride 105 96 - 106 mmol/L    Total CO2 21 20 - 30 mmol/L    Anion Gap 13 8 - 19 mmol/L    Glucose 111 (H) 65 - 99 mg/dL    Creatinine 6.57 8.46 - 1.30 mg/dL    Estimated GFR 73 See GFR Additional Information mL/min/1.74m2    GFR Additional Information See Comment     Urea Nitrogen 8 7 - 22 mg/dL    Calcium 8.8 8.6 - 96.2 mg/dL   Phosphorus    Collection Time: 04/24/23  3:01 AM   Result Value Ref Range    Phosphorus 4.3 2.3 - 4.4 mg/dL   Vitamin X52    Collection Time: 04/24/23  3:02 AM   Result Value Ref Range    Vitamin B12 271 254 - 1,060 pg/mL   Iron & Iron Binding Capacity    Collection Time: 04/24/23  3:02 AM   Result Value Ref Range    Iron 42 41 - 179 mcg/dL    Iron Binding Capacity 381 262 - 502 mcg/dL    % Saturation 11 %   Ferritin    Collection Time: 04/24/23  3:02 AM   Result Value Ref Range    Ferritin 27 8 - 180 ng/mL   Comprehensive Metabolic Panel    Collection Time: 04/24/23  3:02 AM   Result Value Ref Range    Sodium 139 135 - 146 mmol/L    Potassium 3.8 3.6 - 5.3 mmol/L    Chloride 105 96 - 106 mmol/L    Total CO2 23 20 - 30 mmol/L    Anion Gap 11 8 - 19 mmol/L    Glucose 113 (H) 65 - 99 mg/dL    Creatinine 8.41 3.24 - 1.30 mg/dL    Estimated GFR 70 See GFR Additional Information mL/min/1.68m2    GFR Additional Information See Comment     Urea Nitrogen 8 7 - 22 mg/dL    Calcium 9.4 8.6 - 40.1 mg/dL    Total Protein 6.1 6.1 - 8.2 g/dL    Albumin 3.8 (L) 3.9 - 5.0 g/dL    Bilirubin,Total 0.2 0.1 - 1.2 mg/dL    Alkaline Phosphatase 67 37 - 113 U/L    Aspartate Aminotransferase 18 13 - 62 U/L    Alanine Aminotransferase 7 (L) 8 - 70 U/L   Magnesium    Collection Time: 04/24/23  3:02 AM   Result Value Ref Range    Magnesium 1.3 (L) 1.4 - 1.9 mEq/L   Phosphorus    Collection Time: 04/24/23  3:02 AM   Result Value Ref Range    Phosphorus 4.3 2.3 - 4.4 mg/dL   Lactate    Collection Time: 04/24/23  3:02 AM   Result Value Ref Range  Blood Lactate 12 5 - 18 mg/dL   CBC    Collection Time: 04/24/23  3:02 AM   Result Value Ref Range    White Blood Cell Count 7.83 4.16 - 9.95 x10E3/uL    Red Blood Cell Count 3.47 (L) 3.96 - 5.09 x10E6/uL    Hemoglobin 9.3 (L) 11.6 - 15.2 g/dL    Hematocrit 02.7 (L) 34.9 - 45.2 %    Mean Corpuscular Volume 85.0 79.3 - 98.6 fL    Mean Corpuscular Hemoglobin 26.8 26.4 - 33.4 pg    MCH Concentration 31.5 31.5 - 35.5 g/dL    Red Cell Distribution Width-SD 49.2 (H) 36.9 - 48.3 fL    Red Cell Distribution Width-CV 16.0 (H) 11.1 - 15.5 %    Platelet Count, Auto 402 (H) 143 - 398 x10E3/uL    Mean Platelet Volume 8.2 (L) 9.3 - 13.0 fL    Nucleated RBC%, automated 0.0 No Ref. Range %    Absolute Nucleated RBC Count 0.00 0.00 - 0.00 x10E3/uL    Neutrophil Abs (Prelim) 3.85 See Absolute Neut Ct. x10E3/uL   Differential, Automated    Collection Time: 04/24/23  3:02 AM   Result Value Ref Range    Neutrophil Percent, Auto 49.2 No Ref. Range %    Lymphocyte Percent, Auto 42.0 No Ref. Range %    Monocyte Percent, Auto 5.7 No Ref. Range %    Eosinophil Percent, Auto 2.4 No Ref. Range %    Basophil Percent, Auto 0.4 No Ref. Range %    Immature Granulocytes% 0.3 No Reference Range %    Absolute Neut Count 3.85 1.80 - 6.90 x10E3/uL    Absolute Lymphocyte Count 3.29 1.30 - 3.40 x10E3/uL    Absolute Mono Count 0.45 0.20 - 0.80 x10E3/uL    Absolute Eos Count 0.19 0.00 - 0.50 x10E3/uL    Absolute Baso Count 0.03 0.00 - 0.10 x10E3/uL    Absolute Immature Gran Count 0.02 0.00 - 0.04 x10E3/uL   Unfractionated Heparin (UFH), by anti-Xa    Collection Time: 04/24/23  9:24 AM   Result Value Ref Range    Anti-Xa, UFH 0.55   IU/mL       Imaging Review:   Repeat CT scan after the first one 04/23/2023, at 05:30PM to r/o a colonic  bleeding  IMPRESSION:  No active bleed. Near interval resolution of the previously visualized small volume contrast in the terminal ileum and cecum, which may have been transient hemorrhage due to right sided colitis given proximal colonic wall thickening and mucosal   hyperenhancement and/or mild angiodysplasia given apparent serpiginous appearance of the terminal branch of the ileocolic artery. No bowel ischemia.   Signed by: Verdell Carmine   04/23/2023 5:53 PM    CT abd+pelvis angiogram wo+w contrast    Result Date: 04/23/2023  IMPRESSION: No active bleed. Near interval resolution of the previously visualized small volume contrast in the terminal ileum and cecum, which may have been transient hemorrhage due to right sided colitis given proximal colonic wall thickening and mucosal hyperenhancement and/or mild angiodysplasia given apparent serpiginous appearance of the terminal branch of the ileocolic artery. No bowel ischemia.  Signed by: Verdell Carmine   04/23/2023 5:53 PM    CT abd+pelvis angiogram wo+w contrast    Result Date: 04/23/2023  IMPRESSION: 1. 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 Arteries beyond their origin are patent. 2. Circumferential wall thickening in  the ascending and proximal transverse colon, suspicious for colitis which may be ischemic or infectious in etiology. 3. High density foci at the inferior cecum and terminal ileal loops, seen on venous phase imaging and not included in the field-of-view on the noncontrast and arterial phase scans, suspicious for extravasation at the cecum with retrograde flow into distal ileal loops versus extravasation of the distal ileal loops with some antegrade flow. Consider follow-up CT in 6 to 8 hours and/or IR consultation. These results were discussed with [facility] R. LANDEFELD at 04/23/2023 12:51 PM. Signed by: Albin Fischer   04/23/2023 12:54 PM    US duplex abd limited portable    Result Date: 04/08/2023  IMPRESSION: Patent celiac artery and superior mesenteric artery with borderline, approximately 50%, stenosis of the superior mesenteric artery at the origin. I, Verdell Carmine, M.D., have reviewed this radiological study personally and I am in full agreement with the findings of the report presented here. Signed by: Verdell Carmine   04/08/2023 11:14 PM    CT abd+pelvis angiogram wo+w contrast    Result Date: 04/08/2023  IMPRESSION: High-grade stenosis of the origin of superior mesenteric artery. Clinically correlate for mesenteric angina. No direct CT findings of ischemic enteritis or colitis. Signed by: Carie Caddy   04/08/2023 10:14 AM   Diagnosis:   1. SMA stenosis     Assessment/Plan:     Lovey Crupi is a 57 y.o. female with known SMA stenosis and symptoms consistent with chronic mesenteric ischemia presenting with acute abdominal pain on a chronic abdominal pain.   Unclear whether this is acute vs. Chronic and whether her symptoms are primarily of vascular etiology vs infectious vs inflammatory enterocolitis.     - No elements in favor of a bowel ischemia (vitals normal, physical exam not in favor, WBC 6.87, Lactate 10). 2nd CT scan not in favor of a bowel ischemia. Not in favor of a bleeding.     All the patient's questions that arose during the visit were answered to the apparent satisfaction of the patient.    Plan:   - serial abdominal exams  - trend Hg  - fluid resuscitation  - remainder of care per primary team    Author: Isaiah Serge, MD 04/24/2023 11:02 AM  General Surgery  [facility]    Patient seen and examined by and plan developed with the attending physician, Dr. Charlane Ferretti.    Addendum: I have seen and examined the patient. I discussed the findings and formulated the plan of care in collaboration with the resident team. I have reviewed the above report, edited it as appropriate, and agree with the documented findings, assessment, and plan of care.     No acute surgical intervention for bowel. Vascular surgery for chronic mesenteric ischemia.   Georgana Curio, MD

## 2023-04-24 NOTE — Progress Notes
VASCULAR SURGERY CONSULTATION - PROGRESS NOTE    PATIENT: Donna Gross  MRN: [id number]  DOB: 02-16-1966  DATE OF SERVICE: 04/24/2023     PRIMARY CARE PROVIDER: Kelle Darting, MD    Reason for Consultation: Abdominal Pain     Subjective:     History of Present Illness:  Donna Gross is a 57 y.o. female w/ PMHx pf anxiety, depression, emphysema, active smoker (1 cig per day), fibromyalgia, GERD, SMA stenosis who presents to the ED with an acute abdominal pain on a chronic abdominal pain.   The patient has a known SMA stenosis with chronic symptoms (~1 yr) of post-prandial emesis (1-2x per week) of undigested food, abdominal discomfort, and post-prandial diarrhea. Patient reports symptoms began 1 year ago ''after surgery''. 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 Most recent operation was lap chole in 03/2022 c/b subhepatic abscess requiring IR drainage and bile leak requiring ERCP/CBD stent.     She was recently admitted 04/18-04/22 for a similar presentation. During this hospitalization underwent CT ab/pelvis angiogram w/ and w/o contrast which demonstrated high grade stenosis of SMA.     IR and  consulted and did not feel strongly that this was the source of her abdominal pain and recommended against a stenting revascularization as the SMA narrowing is similar to prior studies. Given normal size and patency of the IMA and CA, IR was convinced the SMA narrowing is responsible for the patient's abdominal pain, and therefore stenting would not be beneficial.   The [place] abdo performed on 04/08/2023:   Patent celiac artery and superior mesenteric artery with borderline, approximately 50%, stenosis of the superior mesenteric artery at the origin.     She was started on anticoagulation with eliquis BID during the last hospitalization.    Patient came to the ED today as she reported having an acute diffuse abdominal pain today worse than her usual pain, Had 2 episodes of non-bloody emesis yesterday, and loose stools this morning.   Patients also reports that she is feeling that her belly is swollen.   Denies melena, hematochezia.   Also denies fevers, chills, CP, SOB, sick contacts, dysuria, hematuria.       04/24/2023: AFVSS, pt is still complaining of the same pain. Abdomen still soft and NT today. Is tolerating food. BM *2, passing gas.      Past Medical History:   Past Medical History:   Diagnosis Date    Anxiety     Depression     Emphysema, unspecified (HCC/RAF)     Emphysema, unspecified (HCC/RAF)     Fibromyalgia     Gastritis     GERD (gastroesophageal reflux disease)     History of blood transfusion     Intractable nausea and vomiting 01/23/2020    Pancreatitis        Past Surgical History:   Past Surgical History:   Procedure Laterality Date    ABDOMINAL SURGERY      COLON SURGERY         Scheduled Meds:  Current Facility-Administered Medications   Medication Dose Route Frequency    acetaminophen tab 1,000 mg  1,000 mg Oral Q8H    albuterol 90 mcg/act inh 2 puff  2 puff Inhalation Q6H PRN    atorvastatin tab 40 mg  40  mg Oral QHS    cholestyramine pwd packet 4 g  4 g Oral TID AC    cyanocobalamin tab 1,000 mcg  1,000 mcg Oral Daily    DULoxetine DR cap 30 mg  30 mg Oral Every Other Day    fluticasone-salmeterol 250-50 mcg/act diskus inhaler 1 puff  1 puff Inhalation BID    heparin 25,000 units in 0.45% NaCl 250 mL drip RTU  850 Units/hr Intravenous Continuous    heparin per pharmacy   Does not apply Continuous    [COMPLETED] HYDROmorphone 1 mg/mL inj 1 mg  1 mg IV Push STAT    [COMPLETED] HYDROmorphone 1 mg/mL inj 1 mg  1 mg IV Push STAT    HYDROmorphone 1 mg/mL inj 1 mg  1 mg IV Push Q4H PRN    hyoscyamine tab 125 mcg  125 mcg Oral Q4H PRN    [COMPLETED] iohexol (Omnipaque) 350 mg/mL inj 100 mL  100 mL Intravenous Once    [COMPLETED] iohexol (Omnipaque) 350 mg/mL inj 100 mL  100 mL Intravenous Once    ipratropium-albuterol 20-100 mcg/act inh 2 puff  2 puff Inhalation QID PRN    [COMPLETED] lactated ringers IV soln bolus 1,000 mL  1,000 mL Intravenous Once    [COMPLETED] lactated ringers IV soln bolus 1,000 mL  1,000 mL Intravenous Once    magnesium sulfate 2 g in water for injection 50 mL RTU  2 g Intravenous Once    magnesium sulfate 4 g in water for injection 100 mL RTU  4 g Intravenous Once    nicotine 7 mg/24 hr patch 7 mg  7 mg Transdermal Daily    ondansetron tab 4 mg  4 mg Oral Q6H PRN    oxyCODONE tab 5 mg  5 mg Oral Q4H PRN    oxyCODONE tab 7.5 mg  7.5 mg Oral Q4H PRN    pancrelipase (Lip-Prot-Amyl) (Creon) DR cap 36,000 units of lipase  36,000 units of lipase Oral TID w/meals    pantoprazole DR tab 40 mg  40 mg Oral BID    QUEtiapine tab 150 mg  150 mg Oral BID    ranolazine tab ER12 500 mg  500 mg Oral BID    [COMPLETED] sodium chloride 0.9% IV soln bolus 1,000 mL  1,000 mL Intravenous Once    sodium chloride 0.9% IV soln  10 mL/hr Intravenous PRN    traZODone tab 50 mg  50 mg Oral QHS PRN    vitamin B complex cap 1 capsule  1 capsule Oral Daily    vitamin D (cholecalciferol) tab 50 mcg  50 mcg Oral Daily    [DISCONTINUED] oxyCODONE tab 2.5 mg  2.5 mg Oral Q4H PRN    [DISCONTINUED] oxyCODONE tab 5 mg  5 mg Oral Q4H PRN    [DISCONTINUED] pantoprazole DR tab 40 mg  40 mg Oral Daily       Allergies: Hydrocodone, Ibuprofen, and Morphine    Family History:   Family History   Problem Relation Age of Onset    Heart disease Mother     Diabetes Brother     Diabetes Father     Anesthesia problems Neg Hx     Malignant hypertension Neg Hx     Hypotension Neg Hx     Malignant hyperthermia Neg Hx     Pseudochol deficiency Neg Hx        Social History:   Social History     Socioeconomic History    Marital status:  Single   Tobacco Use    Smoking status: Every Day     Years: 20     Types: Cigarettes Smokeless tobacco: Current    Tobacco comments:     1 cig per day   Substance and Sexual Activity    Alcohol use: No    Drug use: No   Social History Narrative    Lives w/ mom and daughter     Social Determinants of Health     Financial Resource Strain: Low Risk  (04/12/2023)    Financial Resource Strain     Difficulty of Paying Living Expenses: Not very hard       Objective:     Physical Exam:  Last Recorded Vital Signs:    04/24/23 0756   BP: 109/74   Pulse: 79   Resp: 18   Temp: 36.7 ?C (98 ?F)   SpO2: 96%     Body mass index is 18.53 kg/m?Marland Kitchen   Vitals:    04/23/23 1654   Weight: 49 kg (108 lb)   Height: 1.626 m (5' 4.02'')       Vitals:    04/23/23 1654   Weight: 49 kg (108 lb)   Height: 1.626 m (5' 4.02'')     General: The pateint is a well-appearing, well-nourished, in no acute distress.The patient is awake, alert, oriented, and answers questions appropriately.   Head: Normocephalic, atraumatic.   Eyes: Extraocular movements are intact. There is no scleral icterus.   Heart: SR  Chest:  No use of accessory muscle  Abdomen: Soft and non-tender.  Extremities:  No clubbing, cyanosis, or edema.    Laboratory Review:  Lab Results   Component Value Date    WBC 7.83 04/24/2023    HGB 9.3 (L) 04/24/2023    HCT 29.5 (L) 04/24/2023    MCV 85.0 04/24/2023    PLT 402 (H) 04/24/2023     Recent Results (from the past 24 hour(s))   Lactate    Collection Time: 04/23/23  9:37 AM   Result Value Ref Range    Blood Lactate 10 5 - 18 mg/dL   UA,Dipstick,POC    Collection Time: 04/23/23 12:10 PM   Result Value Ref Range    Specific Gravity 1.015 1.005 - 1.030    pH 6.0 5.0 - 8.0    Blood Negative Negative    Bilirubin Negative Negative    Ketone Negative Negative    Glucose Negative Negative    Protein Negative Negative    Nitrite Negative Negative    Leukocyte Esterase Negative Negative   ECG 12 lead    Collection Time: 04/23/23  3:56 PM   Result Value Ref Range    Ventricular Rate 55 BPM    Atrial Rate 54 BPM    P-R Interval 112 ms QRS Duration 86 ms    Q-T Interval 428 ms    QTC Calculation (Bezet) 410 ms    P Axis 90 degrees    R Axis 78 degrees    T Axis 76 degrees    Diagnosis Sinus rhythm     Diagnosis Borderline short PR interval     Diagnosis Consider right atrial enlargement     Diagnosis 12 Lead; Mason-Likar     Diagnosis Otherwise Normal ECG    Unfractionated Heparin (UFH), by anti-Xa    Collection Time: 04/23/23  5:35 PM   Result Value Ref Range    Anti-Xa, UFH <0.10   IU/mL   Unfractionated Heparin (UFH), by anti-Xa  Collection Time: 04/24/23  3:01 AM   Result Value Ref Range    Anti-Xa, UFH 0.47   IU/mL   Folate,Serum    Collection Time: 04/24/23  3:01 AM   Result Value Ref Range    Folate,Serum 21.8 8.1 - 30.4 ng/mL   Prealbumin    Collection Time: 04/24/23  3:01 AM   Result Value Ref Range    Prealbumin 24.4 14.0 - 40.0 mg/dL   Basic Metabolic Panel    Collection Time: 04/24/23  3:01 AM   Result Value Ref Range    Sodium 139 135 - 146 mmol/L    Potassium 4.2 3.6 - 5.3 mmol/L    Chloride 105 96 - 106 mmol/L    Total CO2 21 20 - 30 mmol/L    Anion Gap 13 8 - 19 mmol/L    Glucose 111 (H) 65 - 99 mg/dL    Creatinine 9.62 9.52 - 1.30 mg/dL    Estimated GFR 73 See GFR Additional Information mL/min/1.61m2    GFR Additional Information See Comment     Urea Nitrogen 8 7 - 22 mg/dL    Calcium 8.8 8.6 - 84.1 mg/dL   Phosphorus    Collection Time: 04/24/23  3:01 AM   Result Value Ref Range    Phosphorus 4.3 2.3 - 4.4 mg/dL   Vitamin L24    Collection Time: 04/24/23  3:02 AM   Result Value Ref Range    Vitamin B12 271 254 - 1,060 pg/mL   Iron & Iron Binding Capacity    Collection Time: 04/24/23  3:02 AM   Result Value Ref Range    Iron 42 41 - 179 mcg/dL    Iron Binding Capacity 381 262 - 502 mcg/dL    % Saturation 11 %   Ferritin    Collection Time: 04/24/23  3:02 AM   Result Value Ref Range    Ferritin 27 8 - 180 ng/mL   Comprehensive Metabolic Panel    Collection Time: 04/24/23  3:02 AM   Result Value Ref Range    Sodium 139 135 - 146 mmol/L    Potassium 3.8 3.6 - 5.3 mmol/L    Chloride 105 96 - 106 mmol/L    Total CO2 23 20 - 30 mmol/L    Anion Gap 11 8 - 19 mmol/L    Glucose 113 (H) 65 - 99 mg/dL    Creatinine 4.01 0.27 - 1.30 mg/dL    Estimated GFR 70 See GFR Additional Information mL/min/1.32m2    GFR Additional Information See Comment     Urea Nitrogen 8 7 - 22 mg/dL    Calcium 9.4 8.6 - 25.3 mg/dL    Total Protein 6.1 6.1 - 8.2 g/dL    Albumin 3.8 (L) 3.9 - 5.0 g/dL    Bilirubin,Total 0.2 0.1 - 1.2 mg/dL    Alkaline Phosphatase 67 37 - 113 U/L    Aspartate Aminotransferase 18 13 - 62 U/L    Alanine Aminotransferase 7 (L) 8 - 70 U/L   Magnesium    Collection Time: 04/24/23  3:02 AM   Result Value Ref Range    Magnesium 1.3 (L) 1.4 - 1.9 mEq/L   Phosphorus    Collection Time: 04/24/23  3:02 AM   Result Value Ref Range    Phosphorus 4.3 2.3 - 4.4 mg/dL   Lactate    Collection Time: 04/24/23  3:02 AM   Result Value Ref Range    Blood Lactate 12 5 - 18 mg/dL  CBC    Collection Time: 04/24/23  3:02 AM   Result Value Ref Range    White Blood Cell Count 7.83 4.16 - 9.95 x10E3/uL    Red Blood Cell Count 3.47 (L) 3.96 - 5.09 x10E6/uL    Hemoglobin 9.3 (L) 11.6 - 15.2 g/dL    Hematocrit 84.1 (L) 34.9 - 45.2 %    Mean Corpuscular Volume 85.0 79.3 - 98.6 fL    Mean Corpuscular Hemoglobin 26.8 26.4 - 33.4 pg    MCH Concentration 31.5 31.5 - 35.5 g/dL    Red Cell Distribution Width-SD 49.2 (H) 36.9 - 48.3 fL    Red Cell Distribution Width-CV 16.0 (H) 11.1 - 15.5 %    Platelet Count, Auto 402 (H) 143 - 398 x10E3/uL    Mean Platelet Volume 8.2 (L) 9.3 - 13.0 fL    Nucleated RBC%, automated 0.0 No Ref. Range %    Absolute Nucleated RBC Count 0.00 0.00 - 0.00 x10E3/uL    Neutrophil Abs (Prelim) 3.85 See Absolute Neut Ct. x10E3/uL   Differential, Automated    Collection Time: 04/24/23  3:02 AM   Result Value Ref Range    Neutrophil Percent, Auto 49.2 No Ref. Range %    Lymphocyte Percent, Auto 42.0 No Ref. Range %    Monocyte Percent, Auto 5.7 No Ref. Range % Eosinophil Percent, Auto 2.4 No Ref. Range %    Basophil Percent, Auto 0.4 No Ref. Range %    Immature Granulocytes% 0.3 No Reference Range %    Absolute Neut Count 3.85 1.80 - 6.90 x10E3/uL    Absolute Lymphocyte Count 3.29 1.30 - 3.40 x10E3/uL    Absolute Mono Count 0.45 0.20 - 0.80 x10E3/uL    Absolute Eos Count 0.19 0.00 - 0.50 x10E3/uL    Absolute Baso Count 0.03 0.00 - 0.10 x10E3/uL    Absolute Immature Gran Count 0.02 0.00 - 0.04 x10E3/uL       Imaging Review:   Repeat CT scan after the first one 04/23/2023, at 05:30PM to r/o a colonic  bleeding  IMPRESSION:  No active bleed. Near interval resolution of the previously visualized small volume contrast in the terminal ileum and cecum, which may have been transient hemorrhage due to right sided colitis given proximal colonic wall thickening and mucosal   hyperenhancement and/or mild angiodysplasia given apparent serpiginous appearance of the terminal branch of the ileocolic artery. No bowel ischemia.   Signed by: Verdell Carmine   04/23/2023 5:53 PM    CT abd+pelvis angiogram wo+w contrast    Result Date: 04/23/2023  IMPRESSION: No active bleed. Near interval resolution of the previously visualized small volume contrast in the terminal ileum and cecum, which may have been transient hemorrhage due to right sided colitis given proximal colonic wall thickening and mucosal hyperenhancement and/or mild angiodysplasia given apparent serpiginous appearance of the terminal branch of the ileocolic artery. No bowel ischemia.  Signed by: Verdell Carmine   04/23/2023 5:53 PM    CT abd+pelvis angiogram wo+w contrast    Result Date: 04/23/2023  IMPRESSION: 1. 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 Arteries beyond their origin are patent. 2. Circumferential wall thickening in the ascending and proximal transverse colon, suspicious for colitis which may be ischemic or infectious in etiology. 3. High density foci at the inferior cecum and terminal ileal loops, seen on venous phase imaging and not included in the field-of-view on the noncontrast  and arterial phase scans, suspicious for extravasation at the cecum with retrograde flow into distal ileal loops versus extravasation of the distal ileal loops with some antegrade flow. Consider follow-up CT in 6 to 8 hours and/or IR consultation. These results were discussed with [facility]. LANDEFELD at 04/23/2023 12:51 PM. Signed by: Albin Fischer   04/23/2023 12:54 PM    US duplex abd limited portable    Result Date: 04/08/2023  IMPRESSION: Patent celiac artery and superior mesenteric artery with borderline, approximately 50%, stenosis of the superior mesenteric artery at the origin. I, Verdell Carmine, M.D., have reviewed this radiological study personally and I am in full agreement with the findings of the report presented here. Signed by: Verdell Carmine   04/08/2023 11:14 PM    CT abd+pelvis angiogram wo+w contrast    Result Date: 04/08/2023  IMPRESSION: High-grade stenosis of the origin of superior mesenteric artery. Clinically correlate for mesenteric angina. No direct CT findings of ischemic enteritis or colitis. Signed by: Carie Caddy   04/08/2023 10:14 AM   Diagnosis:   1. SMA stenosis     Assessment/Plan:     Kittie Krizan is a 57 y.o. female with known SMA stenosis and symptoms consistent with chronic mesenteric ischemia presenting with acute abdominal pain on a chronic abdominal pain.   Unclear whether this is acute vs. Chronic and whether her symptoms are primarily of vascular etiology.     - No elements in favor of a bowel ischemia (vitals normal, physical exam not in favor, WBC 6.87, Lactate 10). 2nd CT scan not in favor of a bowel ischemia. Not in favor of a bleeding.     All the patient's questions that arose during the visit were answered to the apparent satisfaction of the patient.    Plan:   - serial abdominal exam  - we recommend an angiogram: will be coordinate with medicine team for the timing and location.   - Rest of care per primary team.    Author: Isaiah Serge, MD 04/24/2023 9:11 AM  General Surgery  [facility]    Patient seen and examined by and plan developed with the attending physician, Dr. Keene Breath.    I spent  30-45  minutes face-to-face with the patient, care coordination, reviewing imaging, pertinent lab findings, and medical decision making. Over half in the discussion of the diagnosis and the discussed risks and benefits of surgery and alternative options.

## 2023-04-24 NOTE — Progress Notes
Pharmaceutical Services - Heparin Consult Management Note    Objective Data   Allergies: Hydrocodone, Ibuprofen, and Morphine  Height:   Most recent documented height   04/23/23 1.626 m (5' 4.02'')     Actual Weight:   Most recent documented weight   04/23/23 49 kg       Heparin drip administrations (last 48 hours)       Date/Time Action Medication Dose Rate    04/23/23 2018 New Bag/ Syringe/ Cartridge    heparin 25,000 units in 0.45% NaCl 250 mL drip RTU 850 Units/hr 8.5 mL/hr            Current Labs  Recent Labs     04/23/23  0856   APTT 31.1     Recent Labs     04/24/23  0301   HEPANTIXAUFH 0.47     Recent Labs     04/24/23  0302   WBC 7.83   HGB 9.3*   HCT 29.5*   MCV 85.0   PLT 402*       INR   Date Value Ref Range Status   04/23/2023 0.9 . Final     Comment:     Therapeutic Range: INR 2-3  Mechanical Valves: INR 2.5-3.5       INR (Outside Lab)   Date Value Ref Range Status   03/09/2021 0.90  Final     Bilirubin,Total   Date Value Ref Range Status   04/24/2023 0.2 0.1 - 1.2 mg/dL Final       Assessment:  Protocol: Regular Intensity: Anti-Xa goal 0.3-0.7 or aPTT goal 66-108  Anti-Xa aPTT Re-bolus if Ordered  (NTE 5,000 units) /  Hold Infusion Rate Change Check Anti-Xa/aPTT   <0.1 <46 50 units/kg Increase by 4 units/kg/hr 6 hours after rate change   0.1-0.19 46-55.9 25 units/kg Increase by 3 units/kg/hr    0.2-0.29 56-65.9 - Increase by 2 units/kg/hr    0.3-0.7 66-108 Continue current rate 6 hours;  If therapeutic x2, recheck qAM   0.71-0.8 108.1-118 - Decrease by 1 unit/kg/hr 6 hours after rate change   0.81-0.9 118.1-128 - Decrease by 2 units/kg/hr    0.91-1 128.1-140 Hold for 1 hour Decrease by 3 units/kg/hr    >1 >140 Hold infusion and repeat STAT anti-Xa/aPTT in 1 hour.  If anti-Xa ? 1 or aPTT ? 140, restart infusion and decrease by 4 units/kg/hr. Repeat anti-Xa/aPTT 6 hours after rate change.  If anti-Xa > 1 or aPTT > 140, continue to hold and repeat anti-Xa/aPTT in 1 hour. Notify provider.       Indication: arterial thrombosis-high grade stanosis  Goal: anti-Xa 0.3-0.7  Dosing weight: 49 kg  Concurrent warfarin: No  Concomitant medications that may contribute to bleeding: duloxetine  Pending invasive procedures: TBD  Signs and symptoms of bleeding/bruising within 24 hours: No      Plan:  Donna Gross is a 57 y.o. female who has been referred to pharmacy for therapeutic heparin management.     Bolus: No bolus at this time  Infusion: Initiate at 850 units/hr    Next anti-Xa/aPTT ordered at 02:20   Plan was communicated to RN.  Pharmacy will continue to monitor the patient?s clinical progress and communicate any changes with treatment team.  For questions about this patient's heparin therapy, please call [phone] [facility]) or [phone] [facility]).    Venia Carbon, PharmD, 04/24/2023, 4:10 AM    ________________________________________________  Addendum:  Last  anti-Xa  at 03:01 was 0.47, which is  Therapeutic.      Plan:   Heparin rate: Continue at 850 units/hr  Next anti-Xa ordered at 09:00.     Plan was communicated to RN. Pharmacy will continue to monitor the patient?s clinical progress. The next anti-Xa level is scheduled on 04/24/23 at 09:00.     For questions about this patient's heparin therapy, please call [phone] [facility]) or [phone] [facility]).    Venia Carbon 04/24/2023 4:16 AM

## 2023-04-24 NOTE — Progress Notes
Family Medicine Progress Note    Primary Care Provider: Kelle Darting, MD  Attending Physician: Rogue Jury., MD  Senior Resident Physician: Yetta Flock, MD  Junior Resident Physician: Christoper Allegra. Steffanie Dunn, MD    Chief complaint: Abdominal pain    Subjective:     Overnight events: requesting stronger pain medication for continued abdominal pain. Refused tylenol.     Today patient requesting progression to regular diet, did not like the soft diet. States pain is the same as yesterday. Still describes as diffuse across the abdomen crampy. Reports multiple episodes of her usual diarrhea after eating yesterday. Indicates signs of rapid transit time- undigested food, PO intake triggers diarrhea.          Pt denies any fevers, chills, vomiting, constipation, sob, or chest pain.    Medications:     Continuous Medications:    heparin 25,000 units/250 mL drip 850 Units/hr (04/24/23 0735)     Scheduled Medications:     acetaminophen  1,000 mg Oral Q8H    atorvastatin  40 mg Oral QHS    cholestyramine  4 g Oral TID AC    cyanocobalamin  1,000 mcg Oral Daily    DULoxetine  30 mg Oral Every Other Day    fluticasone-salmeterol  1 puff Inhalation BID    magnesium sulfate IV  2 g Intravenous Once    nicotine patch  7 mg Transdermal Daily    pancrelipase (Lip-Prot-Amyl)  36,000 units of lipase Oral TID w/meals    pantoprazole  40 mg Oral BID    QUEtiapine  150 mg Oral BID    ranolazine  500 mg Oral BID    vitamin B complex  1 capsule Oral Daily    vitamin D (cholecalciferol)  50 mcg Oral Daily     PRN Medications: albuterol, HYDROmorphone, hyoscyamine, ipratropium-albuterol, ondansetron, oxyCODONE, oxyCODONE, sodium chloride, traZODone      Physical Exam:     Vital signs in last 24 hours:   Temp:  [36.7 ?C (98 ?F)-36.8 ?C (98.3 ?F)] 36.7 ?C (98 ?F)  Heart Rate:  [57-80] 79  Resp:  [10-23] 18  BP: (93-139)/(53-104) 109/74  NBP Mean:  [69-96] 85  SpO2:  [94 %-97 %] 96 %     No intake/output data recorded.    General: Alert, comfortable, NAD  Head: Atraumatic, normocephalic  Eyes: PERRL. EOM intact. Sclera are anincteric and noninjected.  Mouth/Throat: Mucous membranes moist  Cardiac: RRR, nl S1/S2, no m/r/g, no thrills on palpation.   Lungs: Respiratory effort nl, CTAB, no crackles or wheezes  Abdomen: Normoactive bowel sounds. No masses or organomegaly appreciated though exam limited by pain.  Guards with palpation. Diffusely TTP. Multiple abdominal surgery scars.   Extr: No lower extremity edema, warm and well-perfused.  Skin: Warm and dry. No significant rashes or lesions noted.  Neuro: A&O x4/4.   Psych: Normal mood and affect      Laboratory Data:     Recent Labs     04/24/23  0302   WBC 7.83   HGB 9.3*   HCT 29.5*   MCV 85.0   PLT 402*   NEUTPCT 49.2   NA 139   K 3.8   CL 105   CO2 23   BUN 8   CREAT 0.94   MG 1.3*   CALCIUM 9.4     Recent Labs     04/24/23  0302   ALT 7*   AST 18   BILITOT 0.2  ALKPHOS 67   ALBUMIN 3.8*     No results for input(s): ''TSH'', ''HGBA1C'' in the last 72 hours.  No results for input(s): ''CHOL'', ''CHOLHDL'', ''CHOLDLCAL'', ''CHOLDLQ'', ''TRIGLY'' in the last 72 hours.  No results for input(s): ''TROPONIN'', ''BNP'' in the last 72 hours.  Recent Labs     04/23/23  0856   PT 12.6   INR 0.9   APTT 31.1     No results for input(s): ''BACULBLD'', ''BACULUR'', ''BACULRSP'' in the last 72 hours.  No results for input(s): ''GLUCOSEPOC'' in the last 72 hours.    Urinalysis:  Invalid input(s): ''URINEPH'', ''URINEWBC'', ''UAWBC''    Studies:     Imaging:   CT abd+pelvis angiogram wo+w contrast   Final Result by Ellard Artis., MD (05/03 1753)   IMPRESSION:      No active bleed. Near interval resolution of the previously visualized small volume contrast in the terminal ileum and cecum, which may have been transient hemorrhage due to right sided colitis given proximal colonic wall thickening and mucosal    hyperenhancement and/or mild angiodysplasia given apparent serpiginous appearance of the terminal branch of the ileocolic artery. No bowel ischemia.                 Signed by: Verdell Carmine   04/23/2023 5:53 PM      CT abd+pelvis angiogram wo+w contrast   Final Result by Albin Fischer, MD (05/03 1254)   IMPRESSION:         1. 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 Arteries beyond their origin are patent.   2. Circumferential wall thickening in the ascending and proximal transverse colon, suspicious for colitis which may be ischemic or infectious in etiology.   3. High density foci at the inferior cecum and terminal ileal loops, seen on venous phase imaging and not included in the field-of-view on the noncontrast and arterial phase scans, suspicious for extravasation at the cecum with retrograde flow into    distal ileal loops versus extravasation of the distal ileal loops with some antegrade flow. Consider follow-up CT in 6 to 8 hours and/or IR consultation.      These results were discussed with [name] LANDEFELD at 04/23/2023 12:51 PM.               Signed by: Albin Fischer   04/23/2023 12:54 PM          Assessment and Plan:     Donna Gross is a 57 y.o. female with PMHx pf anxiety, depression, emphysema, fibromyalgia, GERD, SMA stenosis who presents with intractable abdominal pain. Likely component of pain is from SMA stenosis. Has been hospitalized many times for similar presentations with negative work besides SMA stenosis. Vascular on-board for procedure to address, though feel pain n/v/d unrelated to SMA.      #Acute on chronic abdominal pain, POA  #SMA stenosis, chronic, POA  #Celiac stenosis, POA  #CT evidence of CAD, POA  Pt been suffering with abdominal pain for approx. 1 year. Has been admitted several times previously with similar complaints. Recently admitted for similar presentation but CT imaging now with evidence of CT extravasation in cecum and ileum c/f possible worsening ischemia. In addition SMA further stenosis and now mild celiac stenosis in addition. Previously evaluated by vascular surgery and IR for interventions but those teams not favorable to SMA as culprit of pain given negative lactate. New CT  findings raise greater c/f SMA occlusion complications. Reassuringly, short interval repeat CT w/o evidence of further ischemia or bleeding. While lactate remains negative, query intermittent elevation not appreciated on our lab collections. Patients symptoms fit with SMA syndrome- worsening postprandial symptoms, food aversion leading to acute refusal, and abdominal pain. Radiology recommends rapid interval f/u CT, will eval and consult specialists for possible interventions/further workup.   -Consult general surgery/ vascular surgery, we appreciate the recs  -Pain management with tylneol ATC, increase oxycodone ss 5/7.5, dilaudid IV PRN. Add gabapentin 100 mg TID  -pantoprazole 40 mg BID  -continue heparin drip for possibility of procedure  -hold home eliquis  -restart atrovastatin 40 mg at bedtime  -Restart regular diet   -daily lactate   -consider colonoscopy      #Diarrhea, chronic, POA  #Nausea/Vomiting, chronic, POA  Pt with chronic nausea/vomiting/diarrhea over last year. So far work up has included negative lab studies: c.diff, celiac panel, h.pylori, parasitic enteric panel. Pt unable to tolerate colonoscopy and negative EGD. Given CT findings of colitis could query infectious etiology but lack of fever, leukocytosis, and sick contacts with fairly recent workup while with current symptoms negative point against. Query poorly controlled GERD. Also consider pain contributing n/v.   -continue home hyoscyamine 125 mcg q4hrs PRN  -zofran 4mg  q6hrs  -EKG to eval Qtc  -Consider stool studies if infectious signs arise (fever, leukocytosis)     #Poor PO intake, chronic, POA  #Severe protein malnutrition, POA  Previously evaluated by nutrition who had recommended that pt receive boost TID for assistance with malnutrition likely 2/2 poor PO intake from abdominal pain. Pt currently NPO given concern for above. Reports weight loss of approx 50lbs in the past year. Poor appetite 2/2 pain.   -Restart diet when able  -nutrition consultation  -f/u vitamins: folate, b12, thiamine    #Normocytic anemia, Chronic POA  Pt presents with hgb of 9.8 c/w her baseline of 9-10. Query anemia 2/2 to nutritional deficincies from poor PO intake. From chart review, no recent vitamin or iron studies. Will order to further characterize. Denies SS of bleeding. Folate/B12 WNL. Iron studies c/f for element of IDA. Given current pain and not wanting to mask diarrhea, will hold on iron supplementation for now but consider when more clinically stable  -daily cbc  -consider iron supplementation      #Tobacco use, POA  Previous PPD smoker from age 29 and reduced over last 7 years to now 1 cigarette all day. Requests nicotine patch while IP.   -nicotine patch  -counseled on nicotine cessation     #Anxiety, chronic, POA  #Major Depression, chronic, POA  #Fibromyalgia, chronic, POA   #Insomnia, chronic, POA  Chronic conditions. Will continue home medications.   - Continue Seroquel 150mg  BID  - Increase duloxetine 30 mg qday   - Trazodone 50 mg at bedtime PRN    #FEN/GI:   - Regular diet    #Access: pIV    #Prophylaxis:  - heparin for therapeutic anticoag in place of home eliquis    #Code: Full Code    DWA McCaffrey, Arlyn Leak., MD who agrees with the above assessment and plan unless otherwise noted in addendum.      Author:  Christoper Allegra. Steffanie Dunn, MD 04/24/2023 9:59 AM   [facility], PGY1

## 2023-04-24 NOTE — Consults
NUTRITION ASSESSMENT (Adult)    Admit Date:      Date of Birth: 1966-01-04 Gender: female MRN: [id number]     Date of Assessment: 04/24/2023    Consult   Indication: Significant weight loss per AND/ASPEN guidelines, Malnutrition   Subjective: Chart reviewed. Spoke with pt at bedside who reports abdominal pain and loose stools. Pt states that abdominal pain persists throughout the entire day. Pt also endorses weight loss which has been noted before during prior admissions. Pt requesting Boost Breeze and not amenable to any other supplement. Pt's intake prior to admission unclear but she attempts to eat as much as tolerated.    Problems: Principal Problem:    Colitis (POA: Yes)  Active Problems:    Generalized abdominal pain (POA: Yes)       Past Medical History:   Diagnosis Date    Anxiety     Depression     Emphysema, unspecified (HCC/RAF)     Emphysema, unspecified (HCC/RAF)     Fibromyalgia     Gastritis     GERD (gastroesophageal reflux disease)     History of blood transfusion     Intractable nausea and vomiting 01/23/2020    Pancreatitis     Past Surgical History:   Procedure Laterality Date    ABDOMINAL SURGERY      COLON SURGERY            Per H&P/MD notes: Donna Gross is a 57 y.o. female w/ PMHx pf anxiety, depression, emphysema, fibromyalgia, GERD, SMA stenosis who presents with intractable abdominal pain.     Patient reports that she has been feeling the same diffuse abdominal pain (acute on chronic), nausea/vomiting, and abdominal swelling that she presented with on  04/18 and has several times previous.      Pt reports she does not understand why they discharged her as none of her symptoms had improved Continues with persistent abdominal pain that is always present and will worsen. Notes some triggers include eating and Bms. Continues with nausea and nonbloody/nonbilious emesis, especially postprandially. Also continues with ''the runs.'' Reports loose stools with all bowel movements. States number of episodes varies on PO intake. Denies melena, hematochezia. Today states nothing she does at home helps her pain. The dilaudid she received has helped. Indicates she came to the hospital today because she has lost 4 more pounds since last discharge on 04/22     Data   Intake/Outputs: No intake/output data recorded.     Pertinent Medications: acetaminophen, 1,000 mg, Oral, Q8H  atorvastatin, 40 mg, Oral, QHS  cholestyramine, 4 g, Oral, TID AC  cyanocobalamin, 1,000 mcg, Oral, Daily  [START ON 04/25/2023] DULoxetine, 30 mg, Oral, Daily  fluticasone-salmeterol, 1 puff, Inhalation, BID  gabapentin, 100 mg, Oral, TID  lactated ringers, 1,000 mL, Intravenous, Once  nicotine patch, 7 mg, Transdermal, Daily  pancrelipase (Lip-Prot-Amyl), 36,000 units of lipase, Oral, TID w/meals  pantoprazole, 40 mg, Oral, BID  QUEtiapine, 150 mg, Oral, BID  ranolazine, 500 mg, Oral, BID  vitamin B complex, 1 capsule, Oral, Daily  vitamin D (cholecalciferol), 50 mcg, Oral, Daily    FDI Target Drugs: No      Pertinent Labs:   Recent Labs     04/24/23  0302 04/24/23  0301 04/23/23  0856 04/23/23  0856   NA 139 139  --  139   K 3.8 4.2  --  4.3   BUN 8 8  --  13   CREAT 0.94 0.91  --  0.95   GLUCOSE 113* 111*  --  90   MG 1.3*  --   --   --    CALCIUM 9.4 8.8  --  9.5   PHOS 4.3 4.3   < >  --    ALBUMIN 3.8*  --   --  4.3   PREALBUMIN  --  24.4  --   --    WBC 7.83  --   --  6.87   HGB 9.3*  --   --  9.8*   HCT 29.5*  --   --  31.7*   FE 42  --   --   --    VITAMINB12 271  --   --   --    BILITOT 0.2  --   --  <0.2   AST 18  --   --  22   ALT 7*  --   --  9   ALKPHOS 67  --   --  72   LIPASE  --   --   --  19   AMYLASE  --   --   --  113    < > = values in this interval not displayed.       Trended Labs     01/28/23  0646   TRIGLY 93        Trended Labs     04/24/23  0302 04/24/23  0301   VITAMINB12 271  --    FOLATE  --  21.8   FE 42  --    TIBC 381  --    FEBINDSAT 11  --    FERRITIN 27  --        Accu-Chek: No results found in last 72 hours    Respiratory Status / O2 Device: None (Room air)    Pressure Injury: none recorded    Additional data:      Peripheral IV 20 G Left Antecubital (1)  Peripheral IV 20 G Anterior;Right Forearm (1)        Diet Info   Allergies:   Hydrocodone, Ibuprofen, and Morphine  Cultural/Ethnic/Religious/Other Food Preferences:  None     Nutrition prior to admit:  regular foods, no special diet indicated  Current diet order:     Diets/Supplements/Feeds   Diet    Diet regular     Start Date/Time: 04/24/23 0840      Number of Occurrences: Until Specified   Nourishments    Oral nutrition supplements Breakfast, Dinner, Lunch; Boost Breeze [place], Boost [employer]     Start Date/Time: 04/23/23 1830      Number of Occurrences: Until Specified     Order Questions:      Meal: Breakfast      Meal: Dinner      Meal: Lunch      Supplement: Boost Breeze [employer]      Supplement: Boost Breeze [employer]     PO % consumed:  (No documented intake yet)  Parenteral/Enteral Nutrition: none     Anthropometrics:   Height: 162.6 cm (5' 4.02'')  Admit Weight: 49.1 kg (108 lb 3.9 oz) (04/23/23 0656)  Current Weight: 49 kg (108 lb 0.4 oz)  BMI: 18.53  IBW: 54 kg/120 lbs  IBW %: 90  Adj BW (if applicable): 53 kg  UBW:    UBW %:         Weight History (last 5)  Weights Weight   03/29/2023   7:51 AM 50.7  kg   04/08/2023   7:03 AM 50.9 kg   04/11/2023   3:47 AM 50.9 kg   04/23/2023   6:56 AM 49.1 kg   04/23/2023   4:54 PM 49 kg       Estimated Nutrition Needs   Based on wt: 49 kg  Calories: 35 - 40 cal/kg = 1715 - 1960 cal/day  Protein: 1.5 - 2 g pro/kg = 73.5 - 98 g pro/day      Diet Education   No diet education needs at this time         Malnutrition Assessment using AND/ASPEN Consensus Criteria   Malnutrition in the Context of: Mild-moderate inflammation/chronic disease   Energy Intake: Unable to assess  Weight Loss: Does not meet criterion - 23 lbs (18%) over the last year.     Nutrition-Focused Physical Exam: 04/24/2023  Subcutaneous Fat Loss: Mild  Areas examined/observed: Orbital Upper  Muscle Loss: Severe  Areas examined/observed: Temple, Clavicle, Deltoid       Patient meets criteria for: Severe protein calorie malnutrition     Nutrition Assessment    Anthropometrics: Patient's BMI is at the cutoff between normal and underweight given current weight and height. Of note, patient has lost 23 lbs over the last year. Muscle and fat wasting observed.     Nutrition: Patient reports following a regular diet at home. No apparent restrictions. However, pt reports pain after eating. Unclear how much pt was eating at home but probable <50%.     Tolerance/GI: Abdominal pain reported by pt; lasts throughout the whole day. Emesis and diarrhea also reported by pt. Hx of loose stools.     Labs: Elevated BG, no hx of DM. Low mg, no repletion yet.     Micronutrients: Vitamin B12 wnl 04/24/23. No other labs yet.     Skin: No pressure injuries      Recommendations / Care Plan      Continue with diet as ordered: Regular  - RD to send Boost Breeze TID     2.   Vitamin and mineral supplementation   - 100 mg thiamine PO    - B complex PO once daily    - 50 mg cholecalciferol PO once daily    - May d/c 1000 mcg cyanocobalamin PO once daily given B12 now wnl    - continue pancreatic lipase TID    3.   Check vitamin B1, B6, D, and zinc     4.   Consider nutrition support due to hx of weight loss and malnutrition    PPN: D7% A4% 343 + 250 kcals @ 60 ml/hr     = 1073 kcals, 58 g protein and ~ 1 g/kg lipids/day     Initiate at half rate and advance as needed     EN: Isosource 1.5 @ 50 ml/hx x 24 hrs     = 1800 kcals and 82 g protein    5.   Monitor I/O, daily labs, and biweekly weights as able        Author:  Jeni Salles, RD pager [phone]   04/24/2023 12:00 PM       [employer] Resident  RD to monitor and follow-up per nutrition protocol

## 2023-04-24 NOTE — Other
Patient's Clinical Goal:   Clinical Goal(s) for the Shift: VSS, pain management  Identify possible barriers to advancing the care plan:   Stability of the patient: Moderately Unstable - medium risk of patient condition declining or worsening    Progression of Patient's Clinical Goal:       Dx : Colitis  Neuro : A/O x4  Cardio : non tele  Pulm: RA  GI : Last BM 5/3  GU: continent  Diet: Reg  MS : Weak  Activity : BMAT 4  Skin : Intact  Accu check: None  IV Access ; Left AC 20 g; RFA 20 g    Patient Complain of abdominal pain, Oxycodone 5mg  and Dilaudid given per order. Paged MD about patient wanting extra pain medication. Waiting for response.

## 2023-04-24 NOTE — Other
Patient's Clinical Goal:   Clinical Goal(s) for the Shift: VSS, safety, rest, pain relief, comfort, adequate nutrition, electrolyte replenishment, hep gtt monitoring/mgmt, monitor for bleeding    Identify possible barriers to advancing the care plan: none    Stability of the patient: Moderately Unstable - medium risk of patient condition declining or worsening      Progression of Patient's Clinical Goal:   Problem/Goal:   Acute on chronic abdominal pain ~ colitis  Chronic diarrhea/nausea/vomiting  Poor PO intake, malnutrition  Hx - anxiety, depression, emphysema, active smoker (1 cig per day), fibromyalgia, GERD, SMA stenosis w/ post prandial diarrhea + emesis  Response to Plan of Care and any changes in condition (clinical condition, any concerning assessments, events, interventions; response to POC effectiveness or changes made):   A/O x4, VSS, able to communicate needs  Pulse ox monitored on RA, denies SOB or resp distress  Advanced to regular diet, pt feeds self  BMAT 4 - ambulates independently, continent x2, no BM on shift  Managed c/o abdominal pain w/ PRN Oxy + Dilaudid IVP x1  Administered Dilaudid IVP 0.5mg  spot dose x1 occurrence  Mg: 1.3 - replenished w/ IVPB magnesium sulfate 6g  Anti-Xa: 0.55 - Continued heparin drip @ 850units/hr, no s/s bleeding observed  Managed pt c/o nausea w/ PO Zofran per request for nausea mgmt  BP 78/49, pt denies s/s -- infused 1L LR bolus + Lactate: 31  recheck BP 94/62, pt remains stable -- 2nd 1L LR bolus administered + Lactate:   LR continuous at 37mL/hr initiated ~ now 59mL/hr    Interdisciplinary communication (team member communication):   Endorsed pt c/o unrelieved pain to resident MD Steffanie Dunn -- MD came to bedside to discuss pain regimen with pt  Endorsed afternoon 78/49 BP to resident MD Steffanie Dunn, no other s/s evident -- see new orders  Endorsed improved BP 94/62 to resident MD - see new orders  See dietician note for treatment recs  Psychosocial communication (family or patient issues potentially affecting care):   none  Plan and Disposition (progression toward specific goals of care/hospitalization and discharge):  Pain relief  Hep gtt monitoring/mgmt - monitor s/s bleeding  Maintenance fluids  Monitor BM - upload picture

## 2023-04-24 NOTE — Discharge Summary
Discharge Summary   Name: Donna Gross MRN: [id number] DOB: 23-Oct-1966   Admit Date: 04/23/2023 D/C Date:   05/01/2023 LOS:   8 days    Admit Attending: Debbe Mounts, MD Discharge Attending: Debbe Mounts, MD    PCP: Kelle Darting, MD Discharge Provider: Burnell Blanks, MD     Inpatient Care Team/ Consults:    Family Medicine Teaching  General Surgery  Vascular Surgery  Nutrition  Pain Management Outpatient Care Team  No care team member to display  Coy Saunas, MD     Admission Diagnosis:  (reason for admission)  Intractable Abdominal Pain  SMA stenosis Discharge Diagnosis:  (conditions treated during hospitalization)  #Acute on chronic abdominal pain, POA  #SMA stenosis, chronic, POA  #Celiac stenosis, POA  #CT evidence of CAD, POA  #s/p angio and stenting of SMA on 04/29/23  #Diarrhea, chronic, POA, resolved  #Nausea/Vomiting, chronic, POA  #Constipation, acute, POA  #Poor PO intake, chronic, POA  #Severe protein malnutrition, POA  #Severe muscle loss with cachexia due to severe malnutrition, POA  #Normocytic anemia, Chronic POA   #Tobacco use, POA   #Anxiety, chronic, POA  #Major Depression, chronic, POA  #Fibromyalgia, chronic, POA   #Insomnia, chronic, POA   Disposition:  Home or Self care and HHPT      Discharge Condition:  stable       HPI:   Donna Gross is a 57 y.o. female w/ PMHx pf anxiety, depression, emphysema, fibromyalgia, GERD, SMA stenosis who presents with intractable abdominal pain.     Patient reports that she has been feeling the same diffuse abdominal pain (acute on chronic), nausea/vomiting, and abdominal swelling that she presented with on  04/18 and has several times previous.      Pt reports she does not understand why they discharged her as none of her symptoms had improved Continues with persistent abdominal pain that is always present and will worsen. Notes some triggers include eating and Bms. Continues with nausea and nonbloody/nonbilious emesis, especially postprandially. Also continues with ''the runs.'' Reports loose stools with all bowel movements. States number of episodes varies on PO intake. Denies melena, hematochezia. Today states nothing she does at home helps her pain. The dilaudid she received has helped. Indicates she came to the hospital today because she has lost 4 more pounds since last discharge on 04/22. In total believes to have lost approx 50 lbs.      Also denies fevers, chills, CP, SOB, sick contacts, dysuria, hematuria.      Recently admitted 04/18-04/22 for a similar presentation. During this hospitalization underwent CT ab/pelvis angiogram w/ and w/o contrast which demonstrated high grade stenosis of SMA. Vascular and IR consulted who did not feel strongly that this was the source of her abdominal pain and recommended against surgical interventions. She was started on anticoagulation with eliquis BID.     Hospital Course:    In brief, pt admitted for intractable abdominal pain and concern for ischemic colitis or bleeding into the colon on initial CTA ab/pelvis evaluation. General surgery and vascular surgery were consulted for these possibilities. Reassuringly, denied any new symptoms (hematochezia, melena, increase in ab pain) and VSS. Short interval CTA repeat read as no further concern for contrast extravasation that would increase c/f bleed but with continued SMA stenosis. Vascular surgery agreed pt would benefit from procedure to address stenosis. Given cath lab availability, patient needed to be transferred to [facility] for procedure.    On 5/9, patient was transferred to [facility]  [place] for angio procedure with Vascular Surgery. Underwent angiography, which found high grade stenosis of SMA. Stent was placed at SMA with excellent luminal gain with filling of distal SMA branches following. Patient was thereafter transferred back to [facility]. She was initially given CLD, which she tolerated well. The following day, she was advanced to a regular diet, which she tolerated well without significant pain, nausea, or vomiting.    Throughout her hospitalization, her pain was managed with multimodal approach including tylenol, PO and IV opiates, and gabapentin. Pain management was consulted who provided recommendations for continued pain management.     On day of discharge, patient was clinically stable, able to ambulate, tolerating a regular diet with minimal abdominal pain. Patient will be discharged on dual antiplatelet therapy (aspirin + plavix), pain/nausea medication, with follow-up with Vascular surgery for Duplex and post-op appt.      Procedures & Operations Performed:   5/9: Vascular angio procedure    Relevant Imaging Studies (most recent results only):     Cath Lab Peripheral Diagnostic and Interventional    Result Date: 04/29/2023  See Procedure tab in Chart Review for full report.     XR kub portable (1 view)    Result Date: 04/27/2023  XR KUB 1V PORTABLE CLINICAL HISTORY: Constipation. COMPARISON: CT abdomen/pelvis dated 04/23/2023.     IMPRESSION: Normal bowel gas pattern. Large stool burden. No signs of pneumatosis or pneumoperitoneum. Right upper quadrant surgical clips. Left upper quadrant anastomotic suture line. Signed by: Alexia Freestone   04/27/2023 1:12 PM    CT abd+pelvis angiogram wo+w contrast    Result Date: 04/23/2023  CT ABD+PELVIS ANGIOGRAM WO+W CONTRAST CLINICAL HISTORY: Recommended radiology repeat to further eval extravasation of contrast in ileum. COMPARISON: CTA abdomen/pelvis dated 04/23/2023 at 11:06 a.m. TECHNIQUE: On a multirow-detector CT scanner, a volumetric pre- and post-contrast scan was performed through the abdomen and pelvis in both the arterial and venous phases. Post processed CTA MIP reformatted images were provided. CONTRAST: iohexol (Omnipaque) 350 mg/mL inj 100 mL RADIATION DOSE: The patient received the following exposure event(s) during this study, and the dose reference values for each are as shown (CTDIvol in mGy, DLP in mGy-cm). Note that the values are not patient dose but numbers generated from scan acquisition factors based on 32 cm (L) and/or 16 cm (S) phantoms and may substantially under-estimate or over-estimate actual patient dose based on patient size and other factors. PreMonitoring, CTDI(L): 0.8, DLP: 0.8;Monitoring, CTDI(L): 6, DLP: 6;Venous, CTDI(L): 10.2, DLP: 429.9;Abd/Pel wo, CTDI(L): 7.3, DLP: 307.6;Arterial, CTDI(L): 10.3, DLP: 432.2 FINDINGS: VASCULAR FINDINGS: Active extravasation: None. Hematoma: None. Atherosclerotic calcifications: Moderate. Aorta: No aneurysm or dissection. Celiac artery: Patent. Mild narrowing at the origin. SMA: Patent with apparent serpiginous appearance of the terminal branch of the ileocolic artery (7-84). Significant calcified plaque with moderate to severe narrowing at its origin.  IMA: Patent. Renal arteries: Moderate calcified plaque at the origin of the left renal artery with mild narrowing. Both vessels are patent. SMV: Patent. IMV: Patent. IVC: Patent. ADDITIONAL FINDINGS: Lower chest: Emphysema and mild subpleural fibrosis and blebs. Bilateral bronchial and bronchiolar wall thickening and mild subpleural reticulation architectural distortion. Coronary artery calcification. Liver: Normal density and contour. No solid mass. Gallbladder and bile ducts: Surgically absent gallbladder with mildly dilated bile ducts, likely related to postcholecystectomy state. Spleen: Unremarkable. Pancreas: Mildly atrophic with unchanged mild dilation of the pancreatic duct, likely age-related. Adrenals: Unremarkable. Kidneys and ureters: Unremarkable. Bowel: Status post partial gastrectomy and gastrojejunostomy. Small periampullary duodenal diverticulum. Nondilated  bowel. Normal appendix. Unchanged mild wall thickening and mucosal hyperenhancement of the ascending and proximal to mid transverse colon with near resolution of the previously visualized contrast in the terminal ileum and cecum with trace residual in the cecum. No pneumatosis. Reproductive organs: Normal uterus. No adnexal masses. Bladder: Moderately distended with contrast. Lymph nodes: Unremarkable. Peritoneum: No free air, free fluid, or fluid collections. Abdominal wall: Postsurgical changes in the anterior abdominal wall. Bones: Degenerative spondylosis mostly at L5-S1 with anterolisthesis of L5 on S1.     IMPRESSION: No active bleed. Near interval resolution of the previously visualized small volume contrast in the terminal ileum and cecum, which may have been transient hemorrhage due to right sided colitis given proximal colonic wall thickening and mucosal hyperenhancement and/or mild angiodysplasia given apparent serpiginous appearance of the terminal branch of the ileocolic artery. No bowel ischemia.  Signed by: Verdell Carmine   04/23/2023 5:53 PM    CT abd+pelvis angiogram wo+w contrast    Result Date: 04/23/2023  CT ABD+PELVIS ANGIOGRAM WO+W CONTRAST CLINICAL HISTORY: Concern for SMA occlusion. COMPARISON: 04/08/2023 and 1124 TECHNIQUE: On a multirow-detector CT scanner, a volumetric pre- and post-contrast scan was performed through the abdomen and pelvis in both the arterial and venous phases. Post processed CTA MIP reformatted images were provided. CONTRAST: iohexol (Omnipaque) 350 mg/mL inj 100 mL RADIATION DOSE: The patient received the following exposure event(s) during this study, and the dose reference values for each are as shown (CTDIvol in mGy, DLP in mGy-cm). Note that the values are not patient dose but numbers generated from scan acquisition factors based on 32 cm (L) and/or 16 cm (S) phantoms and may substantially under-estimate or over-estimate actual patient dose based on patient size and other factors. PreMonitoring, CTDI(L): 0.8, DLP: 0.8;Monitoring, CTDI(L): 3, DLP: 3;Abd wo, CTDI(L): 4.1, DLP: 99.8;Arterial, CTDI(L): 5.4, DLP: 122.4;Venous, CTDI(L): 6.9, DLP: 286.4 FINDINGS: VASCULAR FINDINGS: Active extravasation: No apparent arterial extravasation. Minimal serpiginous high density at the base of the cecum in distal ileal loops, question only seen on venous phase imaging, venous extravasation. Hematoma: None. Atherosclerotic calcifications: Moderate. Aorta: No aneurysm or dissection. Celiac artery: Patent. Mild narrowing at the origin SMA: Significant calcified plaque with moderate to severe narrowing at of its origin. The vessel is patent distally. IMA: Patent. Renal arteries: Moderate calcified plaque at the origin of the left renal artery with mild narrowing. Both vessels are patent. SMV: Patent. IMV: Patent. IVC: Patent. ADDITIONAL FINDINGS: Lower chest: Emphysema and mild subpleural fibrosis and blebs. Bilateral bronchial and bronchiolar wall thickening and mild subpleural reticulation architectural distortion. Coronary artery calcification. Liver: Unremarkable. Gallbladder and bile ducts: Surgically absent gallbladder with mildly dilated bile ducts, likely related to postcholecystectomy state.Marland Kitchen Spleen: Unremarkable. Pancreas: Atrophic with persistent dilatation of the pancreatic duct Adrenals: Unremarkable. Kidneys and ureters: Unremarkable. Bowel: Status post partial gastrectomy and gastrojejunostomy. Small periampullary duodenal diverticulum. No small bowel wall thickening. There is circumferential wall thickening in the ascending and proximal to mid transverse colon. Appendix is normal. High density material and some distal ileal loops with similar but somewhat serpiginous material is seen at the base of the cecum, question venous extravasation Reproductive organs: Unremarkable. Lymph nodes: Unremarkable. Peritoneum: Unremarkable. Abdominal wall: Postsurgical changes in the anterior abdominal wall. Bones: Degenerative spondylosis mostly at L5-S1 with anterolisthesis of L5 on S1.     IMPRESSION: 1. 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 Arteries beyond their origin are patent.  2. Circumferential wall thickening in the ascending and proximal transverse colon, suspicious for colitis which may be ischemic or infectious in etiology. 3. High density foci at the inferior cecum and terminal ileal loops, seen on venous phase imaging and not included in the field-of-view on the noncontrast and arterial phase scans, suspicious for extravasation at the cecum with retrograde flow into distal ileal loops versus extravasation of the distal ileal loops with some antegrade flow. Consider follow-up CT in 6 to 8 hours and/or IR consultation. These results were discussed with [facility]. LANDEFELD at 04/23/2023 12:51 PM. Signed by: Albin Fischer   04/23/2023 12:54 PM    US duplex abd limited portable    Result Date: 04/08/2023  US DUPLEX ABD LIMITED PORTABLE CLINICAL HISTORY: Evaluate SMA stenosis. COMPARISON: CTA abdomen/pelvis dated 04/08/2023. TECHNIQUE: Real time 2D grey scale, spectral and color flow Doppler imaging of the celiac and superior mesenteric artery was performed. FINDINGS: Atherosclerotic plaque in abdominal aorta: Moderate Waveforms: Normal low resistance waveforms throughout  Blood flow velocities are as follows: Abdominal aorta: 64 cm/s Celiac artery, proximal: 88 cm/s (normal < 200 cm/s) Celiac artery, distal: 129 cm/s SMA, proximal: 268 cm/s (normal < 275 cm/s) SMA, mid: 265 cm/s SMA, distal: 224 cm/s     IMPRESSION: Patent celiac artery and superior mesenteric artery with borderline, approximately 50%, stenosis of the superior mesenteric artery at the origin. I, Verdell Carmine, M.D., have reviewed this radiological study personally and I am in full agreement with the findings of the report presented here. Signed by: Verdell Carmine   04/08/2023 11:14 PM    CT abd+pelvis angiogram wo+w contrast    Result Date: 04/08/2023  CT ABD+PELVIS ANGIOGRAM WO+W CONTRAST CLINICAL HISTORY: chronic messenteric ischemia. COMPARISON: CT angiogram from January 31, 2023 TECHNIQUE: On a multirow-detector CT scanner, a volumetric pre- and post-contrast scan was performed through the abdomen and pelvis in both the arterial and venous phases. Post processed CTA MIP reformatted images were provided. CONTRAST: iohexol (Omnipaque) 350 mg/mL inj 100 mL RADIATION DOSE: The patient received the following exposure event(s) during this study, and the dose reference values for each are as shown (CTDIvol in mGy, DLP in mGy-cm). Note that the values are not patient dose but numbers generated from scan acquisition factors based on 32 cm (L) and/or 16 cm (S) phantoms and may substantially under-estimate or over-estimate actual patient dose based on patient size and other factors. PreMonitoring, CTDI(L): 0.8, DLP: 0.8;Monitoring, CTDI(L): 4.5, DLP: 4.5;Abd wo, CTDI(L): 4.6, DLP: 114.7;Arterial, CTDI(L): 7, DLP: 210;Venous, CTDI(L): 7.9, DLP: 356.3 FINDINGS: VASCULAR FINDINGS: Active extravasation: None. Hematoma: None. Atherosclerotic calcifications: Moderate. Aorta: No aneurysm or dissection. Celiac artery: Patent with nonocclusive plaque at the origin. SMA: There is near circumferential atheromatous plaque at the origin of the SMA. The central lumen of the vessel is thrombosed. There is a thin rim of residual contrast flow at the perimeter of the vessel where it is not calcified. The residual lumen measures between 0.9 and 1.4 mm, in keeping with high-grade stenosis, similar in degree to prior examination. There is no intimal dissection. IMA: Patent. Renal arteries: Patent. SMV: Patent. IMV: Patent. IVC: Patent. ADDITIONAL FINDINGS: Lower chest: Moderate emphysematous lung disease including subpleural paraseptal emphysema of the right lower lobe lateral basal segment. There is diffuse parabronchial thickening and cylindrical bronchiolectasis of the bilateral lower lobes. Scattered foci of septal thickening and architectural distortion in keeping with chronic postinflammatory changes. Near complete resolution of pleural effusions. Trace residual low-density pleural thickening in the right posterior  sulcus in keeping with residual pleural fluid versus chronic organized pleural reaction. Liver: Unremarkable. Gallbladder and bile ducts: Surgically absent gallbladder with mildly dilated bile ducts, likely related to postcholecystectomy state. Spleen: Unremarkable. Pancreas: Mildly atrophic. Adrenals: Unremarkable. Kidneys and ureters: Unremarkable. Bowel: Status post partial gastrectomy and gastrojejunostomy. Nondilated bowel with no wall thickening. Large stool volume. Normal appendix. Bladder: Unremarkable. Reproductive organs: Unremarkable. Lymph nodes: Unremarkable. Peritoneum: No free fluid or free air. Abdominal wall: Postsurgical changes in the anterior abdominal wall. Diffuse body wall edema. Dystrophic calcifications in the gluteal subcutaneous tissues.. Bones: Multilevel degenerative changes of the spine with grade 1 spondylotic anterolisthesis of L5-S1. Severely degenerated L5-S1 disc with vacuum disc phenomenon and extensive discogenic endplate sclerosis.     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 Clinically correlate for mesenteric angina. No direct CT findings of ischemic enteritis or colitis. Signed by: Carie Caddy   04/08/2023 10:14 AM       Relevant labs:     CBC:  Lab Results   Component Value Date    WBC 6.23 05/01/2023    HGB 9.0 (L) 05/01/2023    HCT 30.2 (L) 05/01/2023    PLT 365 05/01/2023     LFT:  No results found for: ''TOTPRO'', ''ALBUMIN'', ''BILITOT'', ''ALKPHOS'', ''AST'', ''ALT''      BMP:  Lab Results   Component Value Date    NA 139 05/01/2023    K 5.0 05/01/2023    CL 106 05/01/2023    CO2 26 05/01/2023    BUN 11 05/01/2023    CREAT 1.11 05/01/2023    CALCIUM 8.8 05/01/2023    GLUCOSE 84 05/01/2023       Cal/iCal/MG/Ph:  Lab Results   Component Value Date    CALCIUM 8.8 05/01/2023    MG 1.9 05/01/2023    PHOS 4.1 05/01/2023     Coags:  Lab Results   Component Value Date    PT 12.6 04/29/2023    APTT 34.9 04/29/2023    INR 0.9 04/29/2023               Physical Exam:   BP 155/84  ~ Pulse 64  ~ Temp 36.8 ?C (98.2 ?F) (Oral)  ~ Resp 22  ~ Ht 1.626 m (5' 4.02'')  ~ Wt 49 kg (108 lb)  ~ SpO2 97%  ~ BMI 18.53 kg/m?     General: Alert, comfortable, NAD  Head: Atraumatic, normocephalic  Eyes: PERRL. EOM intact. Sclera are anincteric and noninjected.  Mouth/Throat: Mucous membranes moist  Cardiac: RRR, nl S1/S2, no m/r/g, no thrills on palpation.   Lungs: Respiratory effort nl, CTAB, no crackles or wheezes  Abdomen: Normoactive bowel sounds. No masses or organomegaly appreciated though exam. Soft, mildly tender to palpation diffusely without rebound or guarding. Multiple abdominal surgery scars.   Extr: No lower extremity edema, warm and well-perfused.  Skin: Warm and dry. +incisional site in R groin covered with dressing c/d/I. Normal capillary refill in bilateral feet.  Neuro: A&O x4/4. RLE neurovascularly intact.  Psych: Normal mood and affect      Outpatient Provider To-Do List    [ ]  f/u with PCP  [ ]  f/u with Vascular surgery for Duplex study and outpatient appointment (appts on 6/7 and 6/10)  [ ]  ensure patient has continued dual antiplatelet therapy  [ ]  ensure patient is tolerating food without nausea, vomiting, pain  [ ]  f/u patient's pain regimen (discharged with oxycodone-acetaminophen 7.5 mg-325 mg x30 tabs), patient to follow  up with PCP for additional refills of chronic pain medication  [ ]  f/u on patient's constipation, consider iron supplementation as outpatient         LACE+ Score: 60 (05/01/23 0801) High Risk of Readmission  Better Outcomes by Optimizing Safe Transitions        This patient meets criteria for the following BOOST risk conditions which could lead to readmission. Please consider further evaluation or action on the potential barriers below:    Problems with Medications:      17 scheduled medications active at discharge  High-risk active medications: aspirin 81 mg chewable tablet [[id number]], clopidogrel 75 mg tablet [[id number]], gabapentin 300 mg capsule [[id number]]      Prior Hospitalizations:    2 prior inpatient admission(s) in past 6 months      Patient Support:     Home Health Ordered: . [facility] [phone]      Final Discharge CM Note Written: 04/29/2023                        Pending Labs   The following tests have been collected, but not yet resulted at the time of discharge.    Unresulted Labs (From admission, onward)      None          The following labs have a preliminary result at the time of discharge.  Please check back if the final results have changed.  Preliminary Resulted Labs (From admission, onward)      None          Discharge Medication List  Coumadin and Opiate Risk Assessment   This patient does not have coumadin on their discharge med list  Controlled meds:   Controlled Medications       Opioid Combinations Disp Start End     oxyCODONE-acetaminophen 7.5-325 MG per tablet (Discontinued)      05/01/2023    Sig - Route: Take 1 tablet by mouth four (4) times daily as needed for Pain. - Oral    Class: Historical Med    Reason for Discontinue: Reorder     oxyCODONE-acetaminophen 7.5-325 MG per tablet    30 tablet 05/01/2023     Sig - Route: Take 1 tablet by mouth every six (6) hours as needed for Pain. Max Daily Amount: 4 tablets - Oral    Earliest Fill Date: 05/01/2023           Current Discharge Medication List        START taking these medications    Details   aspirin 81 mg chewable tablet Chew 1 tablet (81 mg total) by mouth daily.  Qty: 90 tablet, Refills: 0      calcium carbonate 500 mg chewable tablet Chew 1 tablet (500 mg total) by mouth two (2) times daily as needed Calcium Carbonate 500 mg is equivalent to 200 mg elemental Calcium.  Qty: 30 tablet, Refills: 0      clopidogrel 75 mg tablet Take 1 tablet (75 mg total) by mouth daily.  Qty: 90 tablet, Refills: 0      gabapentin 300 mg capsule Take 1 capsule (300 mg total) by mouth three (3) times daily.  Qty: 90 capsule, Refills: 0      polyethylene glycol powder packet Take 1 packet (17 g total) by mouth two (2) times daily.  Qty: 100 packet, Refills: 0           CONTINUE these medications which have  CHANGED    Details   DULoxetine 60 mg DR capsule Take 1 capsule (60 mg total) by mouth daily.  Qty: 30 capsule, Refills: 0      ondansetron 4 mg tablet Take 1 tablet (4 mg total) by mouth every six (6) hours as needed for Nausea or Vomiting.  Qty: 30 tablet, Refills: 0      oxyCODONE-acetaminophen 7.5-325 MG per tablet Take 1 tablet by mouth every six (6) hours as needed for Pain. Max Daily Amount: 4 tablets  Qty: 30 tablet, Refills: 0      !! QUEtiapine 150 MG TABS Take 150 mg by mouth two (2) times daily.  Qty: 30 tablet, Refills: 0      senna 8.6 mg tablet Take 1 tablet by mouth at bedtime as needed for Constipation.  Qty: 60 tablet, Refills: 0       !! - Potential duplicate medications found. Please discuss with provider.        CONTINUE these medications which have NOT CHANGED    Details   albuterol (2.5 mg/24mL) 0.083% nebulizer solution Take 3 mLs (2.5 mg total) by nebulization three (3) times daily as needed for Shortness of Breath.      albuterol 90 mcg/act inhaler Inhale 2 puffs every six (6) hours as needed for Wheezing or Shortness of Breath.      atorvastatin 40 mg tablet Take 1 tablet (40 mg total) by mouth at bedtime.      B Complex Vitamins (VITAMIN B COMPLEX) capsule Take 1 capsule by mouth daily.      Cholecalciferol (VITAMIN D3) 50 mcg (2000 units) TABS Take 1 tablet (50 mcg total) by mouth daily.      cholestyramine 4 g/dose powder Take 1 packet (4 g total) by mouth three (3) times daily with meals.  Qty: 378 g, Refills: 3      clobetasol 0.05% cream Apply topically three (3) times daily as needed (rash).      cyanocobalamin 1000 mcg tablet Take 1 tablet (1,000 mcg total) by mouth daily.      fluticasone-salmeterol 250-50 mcg/act diskus inhaler Inhale 1 puff two (2) times daily.      hyoscyamine 0.125 mg tablet Take 1 tablet (125 mcg total) by mouth every four (4) hours as needed for Cramping.      ipratropium-albuterol 20-100 mcg/act inhaler Inhale 2 puffs four (4) times daily as needed for Wheezing or Shortness of Breath.      naloxone 4 mg/0.1 mL nasal spray Call 911. Administer a single spray intranasally into one nostril for opioid overdose. May repeat in 3 minutes if patient is not breathing.Rob Bunting: 2 each, Refills: 1      pancrelipase, Lip-Prot-Amyl, (CREON) 36000 units DR capsule Take 1 capsule (36,000 units of lipase total) by mouth three (3) times daily with meals.      pantoprazole 40 mg DR tablet Take 1 tablet (40 mg total) by mouth daily.      prochlorperazine 10 mg tablet Take 1 tablet (10 mg total) by mouth every six (6) hours as needed for Nausea or Vomiting.      !! QUEtiapine Fumarate (SEROQUEL PO) Take 150 mg by mouth two (2) times daily.      ranolazine 500 mg 12 hr tablet Take 1 tablet (500 mg total) by mouth two (2) times daily.       !! - Potential duplicate medications found. Please discuss with provider.        STOP taking these  medications       apixaban 5 mg tablet             Requested Appointments  There are no active discharge follow-up orders for this encounter.   Scheduled Appointments  Future Appointments   Date Time Provider Department Center   05/28/2023 10:00 AM [facility] [place] [facility] [place]   05/31/2023 12:15 PM Rigberg, Cliffton Asters., MD [id number] [place]            Nutrition Recommendations & Malnutrition Assessment   I have seen and examined the patient and agree with the RD assessment detailed below:     Patient meets criteria for: Severe protein calorie malnutrition    (current weight 49 kg (108 lb), BMI (Calculated): 18.53;  ,  ).   See RD notes for additional details.     - Continue with diet as ordered: Regular  - Continue Boost Breeze TID   - Ok for pt to order snacks PRN, list provided    - PERT management per team. Provide with snacks as well as meals.    - Vitamin and mineral supplementation   - 100 mg thiamine PO    - B complex PO once daily    - 50 mcg cholecalciferol PO once daily     - Consider nutrition support due to hx of weight loss and malnutrition    PPN: D7% A4% 343 + 250 kcals @ 60 ml/hr     = 1073 kcals, 58 g protein and ~ 1 g/kg lipids/day     Initiate at half rate and advance as needed     EN: Isosource 1.5 @ 50 ml/hx x 24 hrs     = 1800 kcals and 82 g protein    -  Monitor I/O, daily labs, and biweekly weights as able        Author:  Vick Frees, RD Pager [phone]  04/28/2023 1:15 PM     Level of Malnutrition: Severe protein calorie malnutrition  Discharge Diet Orders: There are no active discharge diet orders for this encounter.   Skin/Wound         Discharge Activity Orders   There are no active discharge activity orders for this encounter.   Rehab Assessment   Inpatient Recommendation: PT treatment (04/28/23 1741)  Recommendation  Discharge Recommendation: Physical Therapy;Would benefit from continued therapy (04/30/23 1548)  Discharge concerns: Requires supervision for mobility (04/30/23 1548)  Discharge Equipment Recommended: Owns recommended DME (04/30/23 1548)  Comments: anticipate HHPT upon DC home (04/28/23 1743)  Inpatient Recommendation: OT treatment (04/28/23 1717)  Recommendation  Discharge Recommendation: No skilled OT needs upon discharge (04/30/23 1642)  Discharge concerns: Requires supervision for mobility;Requires supervision for self care (04/30/23 1642)  Discharge Equipment Recommended: Shower Chair (04/30/23 1642)  Case Manager/Home Health Assessment   Discharge Information  Discharge Address: [address] [place] [address] (04/29/23 1006)  Final Discharge Needs: Home Health Coordination (04/29/23 1009)  Home Health Coordination  Agency Name: [facility] (04/29/23 1009)  Phone Number(s): [phone] (04/29/23 1009)           Home Health Orders   Home Health Physical Therapy   As directed      Home Health Skilled Nursing   As directed        Goals of Care Note (if new for this encounter)     I have seen and examined the patient on their discharge day.  I have reviewed, edited, and agree with the above discharge summary. More than 30 minutes was personally spent on discharge planning on the day of discharge in coordination of care, counseling the patient and preparation of discharge.  Burnell Blanks, MD  05/01/2023 9:45 AM       FM Attending Addendum:     I saw and examined Donna Gross on the day of discharge and agree with the findings and discharge plan of care, including discharge medications and follow-up appointments, as described in Dr. Germain Osgood note. Patient presented with acute on chronic abdominal pain, PO intolerance and nonbloody diarrhea with a history of known SMA stenosis. Bowel ischemia was ruled out with serial abdominal imaging studies. Vascular Surgery was consulted, and patient was transferred to [facility] for a SMA stent placement, and she was started on DAPT with aspirin and plavix. After the procedure, patient endorsed improvement in her post-prandial abdominal pain. She continued to have chronic abdominal pain, but it was controlled with pain medication, and she was able to tolerate a regular diet without nausea or vomiting at the time of discharge.    >30 minutes were spent on the coordination of her care, discharge, and follow up plan.  Primary care post discharge clinic follow up and Vascular Surgery follow up scheduled, as above.    Debbe Mounts, MD

## 2023-04-24 NOTE — Progress Notes
Pharmaceutical Services - Medication Reconciliation Note - ED    Patient Name: Donna Gross  Medical Record Number: [id number]  Admit date:      Age: 57 y.o.  Sex: female  Allergies:   Allergies   Allergen Reactions    Hydrocodone Other (See Comments)     ''burns a hole in stomach''  Tolerates hydromorphone    Ibuprofen Other (See Comments)     GI discomfort    ''hurts my stomach''    Morphine Itching     Tolerates hydromorphone     Height:   Most recent documented height   04/23/23 1.626 m (5' 4.02'')     Actual Weight:   Most recent documented weight   04/23/23 49 kg   04/11/23 50.9 kg     Diagnosis: The patient is currently admitted with the following concerns/issues: Principal Problem:    Colitis (POA: Yes)  Active Problems:    Generalized abdominal pain (POA: Yes)      Reported Medication History   I spoke with the patient at bedside and used [employer] (outpatient Rx fill history database) to update the home medication list for this hospital admission.    PTA Medication List (discrepancies are noted)   Prior to Admission medications as of 04/24/23 0837   Medication Sig Notes Last Dose   albuterol (2.5 mg/51mL) 0.083% nebulizer solution 2.5 mg, Nebulization, 3 times daily PRN   Past Week   albuterol 90 mcg/act inhaler 2 puffs, Inhalation, Every 6 hours PRN   04/21/2023   apixaban 5 mg tablet 5 mg, Oral, 2 times daily   04/21/2023 at PM   atorvastatin 40 mg tablet 40 mg, Oral, Every night at bedtime   04/21/2023   B Complex Vitamins (VITAMIN B COMPLEX) capsule 1 capsule, Oral, Daily   04/21/2023   Cholecalciferol (VITAMIN D3) 50 mcg (2000 units) TABS 50 mcg, Oral, Daily   04/21/2023   cholestyramine 4 g/dose powder 4 g, Oral, 3 times daily with meals   04/21/2023   clobetasol 0.05% cream Topical, 3 times daily PRN   04/21/2023   cyanocobalamin 1000 mcg tablet 1,000 mcg, Oral, Daily   04/21/2023   DULoxetine 30 mg DR capsule 30 mg, Oral, Every other day   04/21/2023   fluticasone-salmeterol 250-50 mcg/act diskus inhaler 1 puff, Inhalation, 2 times daily   04/21/2023   hyoscyamine 0.125 mg tablet 125 mcg, Oral, Every 4 hours PRN   04/21/2023   ipratropium-albuterol 20-100 mcg/act inhaler 2 puffs, Inhalation, 4 times daily PRN   04/21/2023   naloxone 4 mg/0.1 mL nasal spray Call 911. Administer a single spray intranasally into one nostril for opioid overdose. May repeat in 3 minutes if patient is not breathing.      ondansetron 8 mg tablet 8 mg, Oral, Every 6 hours PRN   04/21/2023   oxyCODONE-acetaminophen 7.5-325 MG per tablet 1 tablet, Oral, 4 times daily PRN   04/21/2023   pancrelipase, Lip-Prot-Amyl, (CREON) 36000 units DR capsule 36,000 units of lipase, Oral, 3 times daily with meals   04/21/2023   pantoprazole 40 mg DR tablet 40 mg, Oral, Daily   04/21/2023   prochlorperazine 10 mg tablet 10 mg, Oral, Every 6 hours PRN      QUEtiapine Fumarate (SEROQUEL PO) 150 mg, Oral, 2 times daily   04/21/2023   ranolazine 500 mg 12 hr tablet 500 mg, Oral, 2 times daily   04/21/2023   senna 8.6 mg tablet 1 tablet, Oral, Every night at bedtime PRN  04/22/2023     The patient's allergies and medications have been reviewed and updated.       Kiese Kilungidi Dianzungu, 04/23/2023, 7:54 PM  -------------------------------------------------------------------------------------------------------------------  I have reviewed the medication list compiled by the medication reconciliation pharmacy technician and agree with the findings.      Reconciliation  The assessment and reconciliation of admission and inpatient orders with PTA medication list is complete.     Consider the following recommendations which differ from inpatient orders if clinically indicated:  Atorvastatin: pt takes 40mg  daily PTA, not continued during admission    Contacted MD Steffanie Dunn regarding above medication discrepancy    This note represents our good faith effort to obtain the best possible medication history from all attainable sources at the time of reconciliation    Dechelle Attaway PharmD, APh, BCPS  Transitions of Care Pharmacist  [phone]

## 2023-04-24 NOTE — Consults
GENERAL SURGERY CONSULTATION     PATIENT: Donna Gross  MRN: [id number]  DOB: January 08, 1966  DATE OF SERVICE: 04/23/2023     PRIMARY CARE PROVIDER: Kelle Darting, MD    Reason for Consultation: Abdominal Pain     Subjective:     History of Present Illness:  Donna Gross is a 57 y.o. female w/ PMHx pf anxiety, depression, emphysema, active smoker (1 cig per day), fibromyalgia, GERD, SMA stenosis who presents to the ED with an acute abdominal pain on a chronic abdominal pain.   The patient has a known SMA stenosis with chronic symptoms (~1 yr) of post-prandial emesis (1-2x per week) of undigested food, abdominal discomfort, and post-prandial diarrhea. Patient reports symptoms began 1 year ago ''after surgery''. 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 Most recent operation was lap chole in 03/2022 c/b subhepatic abscess requiring IR drainage and bile leak requiring ERCP/CBD stent.     She was recently admitted 04/18-04/22 for a similar presentation. During this hospitalization underwent CT ab/pelvis angiogram w/ and w/o contrast which demonstrated high grade stenosis of SMA.     IR and  consulted and did not feel strongly that this was the source of her abdominal pain and recommended against a stenting revascularization as the SMA narrowing is similar to prior studies. Given normal size and patency of the IMA and CA, IR was convinced the SMA narrowing is responsible for the patient's abdominal pain, and therefore stenting would not be beneficial.   The [place] abdo performed on 04/08/2023:   Patent celiac artery and superior mesenteric artery with borderline, approximately 50%, stenosis of the superior mesenteric artery at the origin.     She was started on anticoagulation with eliquis BID during the last hospitalization.    Patient came to the ED today as she reported having an acute diffuse abdominal pain today worse than her usual pain, Had 2 episodes of non-bloody emesis yesterday, and loose stools this morning.   Patients also reports that she is feeling that her belly is swollen.   Denies melena, hematochezia.   Also denies fevers, chills, CP, SOB, sick contacts, dysuria, hematuria.       Past Medical History:   Past Medical History:   Diagnosis Date    Anxiety     Depression     Emphysema, unspecified (HCC/RAF)     Emphysema, unspecified (HCC/RAF)     Fibromyalgia     Gastritis     GERD (gastroesophageal reflux disease)     History of blood transfusion     Intractable nausea and vomiting 01/23/2020    Pancreatitis        Past Surgical History:   Past Surgical History:   Procedure Laterality Date    ABDOMINAL SURGERY      COLON SURGERY         Scheduled Meds:  Current Facility-Administered Medications   Medication Dose Route Frequency    acetaminophen tab 1,000 mg  1,000 mg Oral Q8H    albuterol 90 mcg/act inh 2 puff  2 puff Inhalation Q6H PRN    cholestyramine pwd packet 4 g  4 g Oral TID AC    [START ON 04/24/2023] cyanocobalamin tab 1,000 mcg  1,000 mcg Oral Daily    [START ON 04/24/2023] DULoxetine DR cap 30  mg  30 mg Oral Every Other Day    fluticasone-salmeterol 250-50 mcg/act diskus inhaler 1 puff  1 puff Inhalation BID    heparin per pharmacy   Does not apply Continuous    [COMPLETED] HYDROmorphone 1 mg/mL inj 1 mg  1 mg IV Push STAT    [COMPLETED] HYDROmorphone 1 mg/mL inj 1 mg  1 mg IV Push STAT    [COMPLETED] HYDROmorphone 1 mg/mL inj 1 mg  1 mg IV Push STAT    HYDROmorphone 1 mg/mL inj 1 mg  1 mg IV Push Q4H PRN    hyoscyamine tab 125 mcg  125 mcg Oral Q4H PRN    [COMPLETED] iohexol (Omnipaque) 350 mg/mL inj 100 mL  100 mL Intravenous Once    iohexol (Omnipaque) 350 mg/mL inj 100 mL  100 mL Intravenous Once    ipratropium-albuterol 20-100 mcg/act inh 2 puff  2 puff Inhalation QID PRN    lactated ringers IV soln bolus 1,000 mL  1,000 mL Intravenous Once    [START ON 04/24/2023] nicotine 7 mg/24 hr patch 7 mg  7 mg Transdermal Daily    non formulary 500 mg  500 mg Oral BID    [COMPLETED] ondansetron 4 mg/2 mL inj 4 mg  4 mg Intravenous Once    ondansetron tab 4 mg  4 mg Oral Q6H PRN    oxyCODONE tab 2.5 mg  2.5 mg Oral Q4H PRN    oxyCODONE tab 5 mg  5 mg Oral Q4H PRN    pancrelipase (Lip-Prot-Amyl) (Creon) DR cap 36,000 units of lipase  36,000 units of lipase Oral TID w/meals    pantoprazole DR tab 40 mg  40 mg Oral BID    QUEtiapine tab 150 mg  150 mg Oral BID    [COMPLETED] sodium chloride 0.9% IV soln bolus 1,000 mL  1,000 mL Intravenous Once    traZODone tab 50 mg  50 mg Oral QHS PRN    [START ON 04/24/2023] vitamin B complex cap 1 capsule  1 capsule Oral Daily    [START ON 04/24/2023] vitamin D (cholecalciferol) tab 50 mcg  50 mcg Oral Daily    [DISCONTINUED] pantoprazole DR tab 40 mg  40 mg Oral Daily     Current Outpatient Medications   Medication Sig    albuterol (2.5 mg/41mL) 0.083% nebulizer solution Take 3 mLs (2.5 mg total) by nebulization three (3) times daily as needed for Shortness of Breath.    albuterol 90 mcg/act inhaler Inhale 2 puffs every six (6) hours as needed for Wheezing or Shortness of Breath.    apixaban 5 mg tablet Take 1 tablet (5 mg total) by mouth two (2) times daily.    B Complex Vitamins (VITAMIN B COMPLEX) capsule Take 1 capsule by mouth daily.    Cholecalciferol (VITAMIN D3) 50 mcg (2000 units) TABS Take 1 tablet (50 mcg total) by mouth daily.    cholestyramine 4 g/dose powder Take 1 packet (4 g total) by mouth three (3) times daily with meals.    clobetasol 0.05% cream Apply topically three (3) times daily as needed (rash).    cyanocobalamin 1000 mcg tablet Take 1 tablet (1,000 mcg total) by mouth daily.    DULoxetine 30 mg DR capsule Take 1 capsule (30 mg total) by mouth every other day.    fluticasone-salmeterol 250-50 mcg/act diskus inhaler Inhale 1 puff two (2) times daily. hyoscyamine 0.125 mg tablet Take 1 tablet (125 mcg total) by mouth every four (4) hours as needed  for Cramping.    ipratropium-albuterol 20-100 mcg/act inhaler Inhale 2 puffs four (4) times daily as needed for Wheezing or Shortness of Breath.    ondansetron ODT 4 mg disintegrating tablet Take 1 tablet (4 mg total) by mouth every six (6) hours as needed for Nausea or Vomiting.    oxyCODONE-acetaminophen 7.5-325 MG per tablet Take 1 tablet by mouth four (4) times daily as needed for Pain.    pancrelipase, Lip-Prot-Amyl, (CREON) 36000 units DR capsule Take 1 capsule (36,000 units of lipase total) by mouth three (3) times daily with meals.    pantoprazole 40 mg DR tablet Take 1 tablet (40 mg total) by mouth daily.    QUEtiapine Fumarate (SEROQUEL PO) Take 150 mg by mouth two (2) times daily.    ranolazine 500 mg 12 hr tablet Take 1 tablet (500 mg total) by mouth two (2) times daily.    naloxone 4 mg/0.1 mL nasal spray Call 911. Administer a single spray intranasally into one nostril for opioid overdose. May repeat in 3 minutes if patient is not breathing..       Allergies: Hydrocodone, Ibuprofen, and Morphine    Family History:   Family History   Problem Relation Age of Onset    Heart disease Mother     Diabetes Brother     Diabetes Father     Anesthesia problems Neg Hx     Malignant hypertension Neg Hx     Hypotension Neg Hx     Malignant hyperthermia Neg Hx     Pseudochol deficiency Neg Hx        Social History:   Social History     Socioeconomic History    Marital status: Single   Tobacco Use    Smoking status: Every Day     Years: 20     Types: Cigarettes    Smokeless tobacco: Current    Tobacco comments:     1 cig per day   Substance and Sexual Activity    Alcohol use: No    Drug use: No   Social History Narrative    Lives w/ mom and daughter     Social Determinants of Health     Financial Resource Strain: Low Risk  (04/12/2023)    Financial Resource Strain     Difficulty of Paying Living Expenses: Not very hard Objective:     Physical Exam:  Last Recorded Vital Signs:    04/23/23 1633   BP: 137/65   Pulse: 60   Resp: 23   Temp: 36.8 ?C (98.3 ?F)   SpO2: 97%     Body mass index is 18.53 kg/m?Marland Kitchen   Vitals:    04/23/23 1654   Weight: 49 kg (108 lb)   Height: 1.626 m (5' 4.02'')       Vitals:    04/23/23 1654   Weight: 49 kg (108 lb)   Height: 1.626 m (5' 4.02'')     General: The pateint is a well-appearing, well-nourished, in no acute distress.The patient is awake, alert, oriented, and answers questions appropriately.   Head: Normocephalic, atraumatic.   Eyes: Extraocular movements are intact. There is no scleral icterus.   Heart: SR  Chest:  No use of accessory muscle  Abdomen: Soft and non-tender.  Extremities:  No clubbing, cyanosis, or edema.    Laboratory Review:  Lab Results   Component Value Date    WBC 6.87 04/23/2023    HGB 9.8 (L) 04/23/2023    HCT 31.7 (L) 04/23/2023  MCV 84.5 04/23/2023    PLT 455 (H) 04/23/2023     Recent Results (from the past 24 hour(s))   Basic Metabolic Panel (ED Abdominal Pain/Nausea/Vomiting: Nurse Protocol)    Collection Time: 04/23/23  8:56 AM   Result Value Ref Range    Sodium 139 135 - 146 mmol/L    Potassium 4.3 3.6 - 5.3 mmol/L    Chloride 106 96 - 106 mmol/L    Total CO2 23 20 - 30 mmol/L    Anion Gap 10 8 - 19 mmol/L    Glucose 90 65 - 99 mg/dL    Creatinine 3.47 4.25 - 1.30 mg/dL    Estimated GFR 69 See GFR Additional Information mL/min/1.42m2    GFR Additional Information See Comment     Urea Nitrogen 13 7 - 22 mg/dL    Calcium 9.5 8.6 - 95.6 mg/dL   ALT (SGPT) (ED Abdominal Pain/Nausea/Vomiting: Nurse Protocol)    Collection Time: 04/23/23  8:56 AM   Result Value Ref Range    Alanine Aminotransferase 9 8 - 70 U/L   AST (SGOT) (ED Abdominal Pain/Nausea/Vomiting: Nurse Protocol)    Collection Time: 04/23/23  8:56 AM   Result Value Ref Range    Aspartate Aminotransferase 22 13 - 62 U/L   Bilirubin,Total (ED Abdominal Pain/Nausea/Vomiting: Nurse Protocol)    Collection Time: 04/23/23  8:56 AM   Result Value Ref Range    Bilirubin,Total <0.2 0.1 - 1.2 mg/dL   Alkaline Phosphatase (ED Abdominal Pain/Nausea/Vomiting: Nurse Protocol)    Collection Time: 04/23/23  8:56 AM   Result Value Ref Range    Alkaline Phosphatase 72 37 - 113 U/L   Albumin (ED Abdominal Pain/Nausea/Vomiting: Nurse Protocol)    Collection Time: 04/23/23  8:56 AM   Result Value Ref Range    Albumin 4.3 3.9 - 5.0 g/dL   Lipase (ED Abdominal Pain/Nausea/Vomiting: Nurse Protocol)    Collection Time: 04/23/23  8:56 AM   Result Value Ref Range    Lipase 19 13 - 69 U/L   Amylase (ED Abdominal Pain/Nausea/Vomiting: Nurse Protocol)    Collection Time: 04/23/23  8:56 AM   Result Value Ref Range    Amylase 113 31 - 124 U/L   Prothrombin Time Panel (ED Abdominal Pain/Nausea/Vomiting: Nurse Protocol)    Collection Time: 04/23/23  8:56 AM   Result Value Ref Range    Prothrombin Time 12.6 11.5 - 14.4 seconds    INR 0.9 .   APTT (ED Abdominal Pain/Nausea/Vomiting: Nurse Protocol)    Collection Time: 04/23/23  8:56 AM   Result Value Ref Range    APTT 31.1 24.4 - 36.2 seconds   CBC    Collection Time: 04/23/23  8:56 AM   Result Value Ref Range    White Blood Cell Count 6.87 4.16 - 9.95 x10E3/uL    Red Blood Cell Count 3.75 (L) 3.96 - 5.09 x10E6/uL    Hemoglobin 9.8 (L) 11.6 - 15.2 g/dL    Hematocrit 38.7 (L) 34.9 - 45.2 %    Mean Corpuscular Volume 84.5 79.3 - 98.6 fL    Mean Corpuscular Hemoglobin 26.1 (L) 26.4 - 33.4 pg    MCH Concentration 30.9 (L) 31.5 - 35.5 g/dL    Red Cell Distribution Width-SD 49.0 (H) 36.9 - 48.3 fL    Red Cell Distribution Width-CV 16.0 (H) 11.1 - 15.5 %    Platelet Count, Auto 455 (H) 143 - 398 x10E3/uL    Mean Platelet Volume 8.2 (L) 9.3 - 13.0 fL  Nucleated RBC%, automated 0.0 No Ref. Range %    Absolute Nucleated RBC Count 0.00 0.00 - 0.00 x10E3/uL    Neutrophil Abs (Prelim) 4.45 See Absolute Neut Ct. x10E3/uL   Differential, Automated    Collection Time: 04/23/23  8:56 AM   Result Value Ref Range Neutrophil Percent, Auto 64.9 No Ref. Range %    Lymphocyte Percent, Auto 26.9 No Ref. Range %    Monocyte Percent, Auto 6.1 No Ref. Range %    Eosinophil Percent, Auto 1.6 No Ref. Range %    Basophil Percent, Auto 0.4 No Ref. Range %    Immature Granulocytes% 0.1 No Reference Range %    Absolute Neut Count 4.45 1.80 - 6.90 x10E3/uL    Absolute Lymphocyte Count 1.85 1.30 - 3.40 x10E3/uL    Absolute Mono Count 0.42 0.20 - 0.80 x10E3/uL    Absolute Eos Count 0.11 0.00 - 0.50 x10E3/uL    Absolute Baso Count 0.03 0.00 - 0.10 x10E3/uL    Absolute Immature Gran Count 0.01 0.00 - 0.04 x10E3/uL   Extra Light Green Top    Collection Time: 04/23/23  8:56 AM   Result Value Ref Range    Extra Tube Performed    Lactate    Collection Time: 04/23/23  9:37 AM   Result Value Ref Range    Blood Lactate 10 5 - 18 mg/dL   UA,Dipstick,POC    Collection Time: 04/23/23 12:10 PM   Result Value Ref Range    Specific Gravity 1.015 1.005 - 1.030    pH 6.0 5.0 - 8.0    Blood Negative Negative    Bilirubin Negative Negative    Ketone Negative Negative    Glucose Negative Negative    Protein Negative Negative    Nitrite Negative Negative    Leukocyte Esterase Negative Negative       Imaging Review:   Repeat CT scan after the first one 04/23/2023, at 05:30PM to r/o a colonic  bleeding  IMPRESSION:  No active bleed. Near interval resolution of the previously visualized small volume contrast in the terminal ileum and cecum, which may have been transient hemorrhage due to right sided colitis given proximal colonic wall thickening and mucosal   hyperenhancement and/or mild angiodysplasia given apparent serpiginous appearance of the terminal branch of the ileocolic artery. No bowel ischemia.   Signed by: Verdell Carmine   04/23/2023 5:53 PM    CT abd+pelvis angiogram wo+w contrast    Result Date: 04/23/2023  IMPRESSION: 1. 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 Arteries beyond their origin are patent. 2. Circumferential wall thickening in the ascending and proximal transverse colon, suspicious for colitis which may be ischemic or infectious in etiology. 3. High density foci at the inferior cecum and terminal ileal loops, seen on venous phase imaging and not included in the field-of-view on the noncontrast and arterial phase scans, suspicious for extravasation at the cecum with retrograde flow into distal ileal loops versus extravasation of the distal ileal loops with some antegrade flow. Consider follow-up CT in 6 to 8 hours and/or IR consultation. These results were discussed with [facility] R. LANDEFELD at 04/23/2023 12:51 PM. Signed by: Albin Fischer   04/23/2023 12:54 PM    US duplex abd limited portable    Result Date: 04/08/2023  IMPRESSION: Patent celiac artery and superior mesenteric artery with borderline, approximately 50%, stenosis of the superior mesenteric artery at the  origin. I, Verdell Carmine, M.D., have reviewed this radiological study personally and I am in full agreement with the findings of the report presented here. Signed by: Verdell Carmine   04/08/2023 11:14 PM    CT abd+pelvis angiogram wo+w contrast    Result Date: 04/08/2023  IMPRESSION: High-grade stenosis of the origin of superior mesenteric artery. Clinically correlate for mesenteric angina. No direct CT findings of ischemic enteritis or colitis. Signed by: Carie Caddy   04/08/2023 10:14 AM   Diagnosis:   1. SMA stenosis     Assessment/Plan:     Donna Gross is a 57 y.o. female with known SMA stenosis and symptoms consistent with chronic mesenteric ischemia presenting with acute abdominal pain on a chronic abdominal pain.   Unclear whether this is acute vs. Chronic and whether her symptoms are primarily of vascular etiology.     - No elements in favor of a bowel ischemia (vitals normal, physical exam not in favor, WBC 6.87, Lactate 10). 2nd CT scan not in favor of a bowel ischemia. Not in favor of a bleeding.     All the patient's questions that arose during the visit were answered to the apparent satisfaction of the patient.    Plan:   - serial abdominal exam  - we recommend an angiogram: will be coordinate with medicine team for the timing and location.   - Rest of care per primary team.    Author: Isaiah Serge, MD 04/23/2023 5:21 PM  General Surgery  [facility]    Patient seen and examined by and plan developed with the attending physician, Dr. Keene Breath.    I spent  30-45  minutes face-to-face with the patient, care coordination, reviewing imaging, pertinent lab findings, and medical decision making. Over half in the discussion of the diagnosis and the discussed risks and benefits of surgery and alternative options.

## 2023-04-24 NOTE — Consults
GENERAL SURGERY CONSULTATION     PATIENT: Donna Gross  MRN: [id number]  DOB: 03/18/1966  DATE OF SERVICE: 04/23/2023     PRIMARY CARE PROVIDER: Kelle Darting, MD    Reason for Consultation: Abdominal Pain     Subjective:     History of Present Illness:  Donna Gross is a 57 y.o. female w/ PMHx pf anxiety, depression, emphysema, active smoker (1 cig per day), fibromyalgia, GERD, SMA stenosis who presents to the ED with an acute abdominal pain on a chronic abdominal pain.   The patient has a known SMA stenosis with chronic symptoms (~1 yr) of post-prandial emesis (1-2x per week) of undigested food, abdominal discomfort, and post-prandial diarrhea. Patient reports symptoms began 1 year ago ''after surgery''. 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 Most recent operation was lap chole in 03/2022 c/b subhepatic abscess requiring IR drainage and bile leak requiring ERCP/CBD stent.     She was recently admitted 04/18-04/22 for a similar presentation. During this hospitalization underwent CT ab/pelvis angiogram w/ and w/o contrast which demonstrated high grade stenosis of SMA.     IR and  consulted and did not feel strongly that this was the source of her abdominal pain and recommended against a stenting revascularization as the SMA narrowing is similar to prior studies. Given normal size and patency of the IMA and CA, IR was convinced the SMA narrowing is responsible for the patient's abdominal pain, and therefore stenting would not be beneficial.   The [place] abdo performed on 04/08/2023:   Patent celiac artery and superior mesenteric artery with borderline, approximately 50%, stenosis of the superior mesenteric artery at the origin.     She was started on anticoagulation with eliquis BID during the last hospitalization.    Patient came to the ED today as she reported having an acute diffuse abdominal pain today worse than her usual pain, Had 2 episodes of non-bloody emesis yesterday, and loose stools this morning.   Patients also reports that she is feeling that her belly is swollen.   Denies melena, hematochezia.   Also denies fevers, chills, CP, SOB, sick contacts, dysuria, hematuria.         Past Medical History:   Past Medical History:   Diagnosis Date    Anxiety     Depression     Emphysema, unspecified (HCC/RAF)     Emphysema, unspecified (HCC/RAF)     Fibromyalgia     Gastritis     GERD (gastroesophageal reflux disease)     History of blood transfusion     Intractable nausea and vomiting 01/23/2020    Pancreatitis        Past Surgical History:   Past Surgical History:   Procedure Laterality Date    ABDOMINAL SURGERY      COLON SURGERY         Scheduled Meds:  Current Facility-Administered Medications   Medication Dose Route Frequency    acetaminophen tab 1,000 mg  1,000 mg Oral Q8H    albuterol 90 mcg/act inh 2 puff  2 puff Inhalation Q6H PRN    cholestyramine pwd packet 4 g  4 g Oral TID AC    [START ON 04/24/2023] cyanocobalamin tab 1,000 mcg  1,000 mcg Oral Daily    [START ON 04/24/2023] DULoxetine DR  cap 30 mg  30 mg Oral Every Other Day    fluticasone-salmeterol 250-50 mcg/act diskus inhaler 1 puff  1 puff Inhalation BID    heparin per pharmacy   Does not apply Continuous    [COMPLETED] HYDROmorphone 1 mg/mL inj 1 mg  1 mg IV Push STAT    [COMPLETED] HYDROmorphone 1 mg/mL inj 1 mg  1 mg IV Push STAT    [COMPLETED] HYDROmorphone 1 mg/mL inj 1 mg  1 mg IV Push STAT    HYDROmorphone 1 mg/mL inj 1 mg  1 mg IV Push Q4H PRN    hyoscyamine tab 125 mcg  125 mcg Oral Q4H PRN    [COMPLETED] iohexol (Omnipaque) 350 mg/mL inj 100 mL  100 mL Intravenous Once    [COMPLETED] iohexol (Omnipaque) 350 mg/mL inj 100 mL  100 mL Intravenous Once    ipratropium-albuterol 20-100 mcg/act inh 2 puff  2 puff Inhalation QID PRN lactated ringers IV soln bolus 1,000 mL  1,000 mL Intravenous Once    lactated ringers IV soln bolus 1,000 mL  1,000 mL Intravenous Once    [START ON 04/24/2023] nicotine 7 mg/24 hr patch 7 mg  7 mg Transdermal Daily    non formulary 500 mg  500 mg Oral BID    [COMPLETED] ondansetron 4 mg/2 mL inj 4 mg  4 mg Intravenous Once    ondansetron tab 4 mg  4 mg Oral Q6H PRN    oxyCODONE tab 2.5 mg  2.5 mg Oral Q4H PRN    oxyCODONE tab 5 mg  5 mg Oral Q4H PRN    pancrelipase (Lip-Prot-Amyl) (Creon) DR cap 36,000 units of lipase  36,000 units of lipase Oral TID w/meals    pantoprazole DR tab 40 mg  40 mg Oral BID    QUEtiapine tab 150 mg  150 mg Oral BID    [COMPLETED] sodium chloride 0.9% IV soln bolus 1,000 mL  1,000 mL Intravenous Once    traZODone tab 50 mg  50 mg Oral QHS PRN    [START ON 04/24/2023] vitamin B complex cap 1 capsule  1 capsule Oral Daily    [START ON 04/24/2023] vitamin D (cholecalciferol) tab 50 mcg  50 mcg Oral Daily    [DISCONTINUED] pantoprazole DR tab 40 mg  40 mg Oral Daily     Current Outpatient Medications   Medication Sig    albuterol (2.5 mg/38mL) 0.083% nebulizer solution Take 3 mLs (2.5 mg total) by nebulization three (3) times daily as needed for Shortness of Breath.    albuterol 90 mcg/act inhaler Inhale 2 puffs every six (6) hours as needed for Wheezing or Shortness of Breath.    apixaban 5 mg tablet Take 1 tablet (5 mg total) by mouth two (2) times daily.    B Complex Vitamins (VITAMIN B COMPLEX) capsule Take 1 capsule by mouth daily.    Cholecalciferol (VITAMIN D3) 50 mcg (2000 units) TABS Take 1 tablet (50 mcg total) by mouth daily.    cholestyramine 4 g/dose powder Take 1 packet (4 g total) by mouth three (3) times daily with meals.    clobetasol 0.05% cream Apply topically three (3) times daily as needed (rash).    cyanocobalamin 1000 mcg tablet Take 1 tablet (1,000 mcg total) by mouth daily.    DULoxetine 30 mg DR capsule Take 1 capsule (30 mg total) by mouth every other day. fluticasone-salmeterol 250-50 mcg/act diskus inhaler Inhale 1 puff two (2) times daily.    hyoscyamine 0.125 mg  tablet Take 1 tablet (125 mcg total) by mouth every four (4) hours as needed for Cramping.    ipratropium-albuterol 20-100 mcg/act inhaler Inhale 2 puffs four (4) times daily as needed for Wheezing or Shortness of Breath.    ondansetron ODT 4 mg disintegrating tablet Take 1 tablet (4 mg total) by mouth every six (6) hours as needed for Nausea or Vomiting.    oxyCODONE-acetaminophen 7.5-325 MG per tablet Take 1 tablet by mouth four (4) times daily as needed for Pain.    pancrelipase, Lip-Prot-Amyl, (CREON) 36000 units DR capsule Take 1 capsule (36,000 units of lipase total) by mouth three (3) times daily with meals.    pantoprazole 40 mg DR tablet Take 1 tablet (40 mg total) by mouth daily.    QUEtiapine Fumarate (SEROQUEL PO) Take 150 mg by mouth two (2) times daily.    ranolazine 500 mg 12 hr tablet Take 1 tablet (500 mg total) by mouth two (2) times daily.    naloxone 4 mg/0.1 mL nasal spray Call 911. Administer a single spray intranasally into one nostril for opioid overdose. May repeat in 3 minutes if patient is not breathing..       Allergies: Hydrocodone, Ibuprofen, and Morphine    Family History:   Family History   Problem Relation Age of Onset    Heart disease Mother     Diabetes Brother     Diabetes Father     Anesthesia problems Neg Hx     Malignant hypertension Neg Hx     Hypotension Neg Hx     Malignant hyperthermia Neg Hx     Pseudochol deficiency Neg Hx        Social History:   Social History     Socioeconomic History    Marital status: Single   Tobacco Use    Smoking status: Every Day     Years: 20     Types: Cigarettes    Smokeless tobacco: Current    Tobacco comments:     1 cig per day   Substance and Sexual Activity    Alcohol use: No    Drug use: No   Social History Narrative    Lives w/ mom and daughter     Social Determinants of Health     Financial Resource Strain: Low Risk (04/12/2023)    Financial Resource Strain     Difficulty of Paying Living Expenses: Not very hard       Objective:     Physical Exam:  Last Recorded Vital Signs:    04/23/23 1633   BP: 137/65   Pulse: 60   Resp: 23   Temp: 36.8 ?C (98.3 ?F)   SpO2: 97%     Body mass index is 18.53 kg/m?Marland Kitchen   Vitals:    04/23/23 1654   Weight: 49 kg (108 lb)   Height: 1.626 m (5' 4.02'')       Vitals:    04/23/23 1654   Weight: 49 kg (108 lb)   Height: 1.626 m (5' 4.02'')     General: The pateint is a well-appearing, well-nourished, in no acute distress.The patient is awake, alert, oriented, and answers questions appropriately.   Head: Normocephalic, atraumatic.   Eyes: Extraocular movements are intact. There is no scleral icterus.   Heart: SR  Chest:  No use of accessory muscle  Abdomen: Soft and non-tender.  Extremities:  No clubbing, cyanosis, or edema.    Laboratory Review:  Lab Results   Component Value Date  WBC 6.87 04/23/2023    HGB 9.8 (L) 04/23/2023    HCT 31.7 (L) 04/23/2023    MCV 84.5 04/23/2023    PLT 455 (H) 04/23/2023     Recent Results (from the past 24 hour(s))   Basic Metabolic Panel (ED Abdominal Pain/Nausea/Vomiting: Nurse Protocol)    Collection Time: 04/23/23  8:56 AM   Result Value Ref Range    Sodium 139 135 - 146 mmol/L    Potassium 4.3 3.6 - 5.3 mmol/L    Chloride 106 96 - 106 mmol/L    Total CO2 23 20 - 30 mmol/L    Anion Gap 10 8 - 19 mmol/L    Glucose 90 65 - 99 mg/dL    Creatinine 8.11 9.14 - 1.30 mg/dL    Estimated GFR 69 See GFR Additional Information mL/min/1.55m2    GFR Additional Information See Comment     Urea Nitrogen 13 7 - 22 mg/dL    Calcium 9.5 8.6 - 78.2 mg/dL   ALT (SGPT) (ED Abdominal Pain/Nausea/Vomiting: Nurse Protocol)    Collection Time: 04/23/23  8:56 AM   Result Value Ref Range    Alanine Aminotransferase 9 8 - 70 U/L   AST (SGOT) (ED Abdominal Pain/Nausea/Vomiting: Nurse Protocol)    Collection Time: 04/23/23  8:56 AM   Result Value Ref Range    Aspartate Aminotransferase 22 13 - 62 U/L   Bilirubin,Total (ED Abdominal Pain/Nausea/Vomiting: Nurse Protocol)    Collection Time: 04/23/23  8:56 AM   Result Value Ref Range    Bilirubin,Total <0.2 0.1 - 1.2 mg/dL   Alkaline Phosphatase (ED Abdominal Pain/Nausea/Vomiting: Nurse Protocol)    Collection Time: 04/23/23  8:56 AM   Result Value Ref Range    Alkaline Phosphatase 72 37 - 113 U/L   Albumin (ED Abdominal Pain/Nausea/Vomiting: Nurse Protocol)    Collection Time: 04/23/23  8:56 AM   Result Value Ref Range    Albumin 4.3 3.9 - 5.0 g/dL   Lipase (ED Abdominal Pain/Nausea/Vomiting: Nurse Protocol)    Collection Time: 04/23/23  8:56 AM   Result Value Ref Range    Lipase 19 13 - 69 U/L   Amylase (ED Abdominal Pain/Nausea/Vomiting: Nurse Protocol)    Collection Time: 04/23/23  8:56 AM   Result Value Ref Range    Amylase 113 31 - 124 U/L   Prothrombin Time Panel (ED Abdominal Pain/Nausea/Vomiting: Nurse Protocol)    Collection Time: 04/23/23  8:56 AM   Result Value Ref Range    Prothrombin Time 12.6 11.5 - 14.4 seconds    INR 0.9 .   APTT (ED Abdominal Pain/Nausea/Vomiting: Nurse Protocol)    Collection Time: 04/23/23  8:56 AM   Result Value Ref Range    APTT 31.1 24.4 - 36.2 seconds   CBC    Collection Time: 04/23/23  8:56 AM   Result Value Ref Range    White Blood Cell Count 6.87 4.16 - 9.95 x10E3/uL    Red Blood Cell Count 3.75 (L) 3.96 - 5.09 x10E6/uL    Hemoglobin 9.8 (L) 11.6 - 15.2 g/dL    Hematocrit 95.6 (L) 34.9 - 45.2 %    Mean Corpuscular Volume 84.5 79.3 - 98.6 fL    Mean Corpuscular Hemoglobin 26.1 (L) 26.4 - 33.4 pg    MCH Concentration 30.9 (L) 31.5 - 35.5 g/dL    Red Cell Distribution Width-SD 49.0 (H) 36.9 - 48.3 fL    Red Cell Distribution Width-CV 16.0 (H) 11.1 - 15.5 %    Platelet  Count, Auto 455 (H) 143 - 398 x10E3/uL    Mean Platelet Volume 8.2 (L) 9.3 - 13.0 fL    Nucleated RBC%, automated 0.0 No Ref. Range %    Absolute Nucleated RBC Count 0.00 0.00 - 0.00 x10E3/uL    Neutrophil Abs (Prelim) 4.45 See Absolute Neut Ct. x10E3/uL Differential, Automated    Collection Time: 04/23/23  8:56 AM   Result Value Ref Range    Neutrophil Percent, Auto 64.9 No Ref. Range %    Lymphocyte Percent, Auto 26.9 No Ref. Range %    Monocyte Percent, Auto 6.1 No Ref. Range %    Eosinophil Percent, Auto 1.6 No Ref. Range %    Basophil Percent, Auto 0.4 No Ref. Range %    Immature Granulocytes% 0.1 No Reference Range %    Absolute Neut Count 4.45 1.80 - 6.90 x10E3/uL    Absolute Lymphocyte Count 1.85 1.30 - 3.40 x10E3/uL    Absolute Mono Count 0.42 0.20 - 0.80 x10E3/uL    Absolute Eos Count 0.11 0.00 - 0.50 x10E3/uL    Absolute Baso Count 0.03 0.00 - 0.10 x10E3/uL    Absolute Immature Gran Count 0.01 0.00 - 0.04 x10E3/uL   Extra Light Green Top    Collection Time: 04/23/23  8:56 AM   Result Value Ref Range    Extra Tube Performed    Lactate    Collection Time: 04/23/23  9:37 AM   Result Value Ref Range    Blood Lactate 10 5 - 18 mg/dL   UA,Dipstick,POC    Collection Time: 04/23/23 12:10 PM   Result Value Ref Range    Specific Gravity 1.015 1.005 - 1.030    pH 6.0 5.0 - 8.0    Blood Negative Negative    Bilirubin Negative Negative    Ketone Negative Negative    Glucose Negative Negative    Protein Negative Negative    Nitrite Negative Negative    Leukocyte Esterase Negative Negative       Imaging Review:   Repeat CT scan after the first one 04/23/2023, at 05:30PM to r/o a colonic  bleeding  IMPRESSION:  No active bleed. Near interval resolution of the previously visualized small volume contrast in the terminal ileum and cecum, which may have been transient hemorrhage due to right sided colitis given proximal colonic wall thickening and mucosal   hyperenhancement and/or mild angiodysplasia given apparent serpiginous appearance of the terminal branch of the ileocolic artery. No bowel ischemia.   Signed by: Verdell Carmine   04/23/2023 5:53 PM    CT abd+pelvis angiogram wo+w contrast    Result Date: 04/23/2023  IMPRESSION: No active bleed. Near interval resolution of the previously visualized small volume contrast in the terminal ileum and cecum, which may have been transient hemorrhage due to right sided colitis given proximal colonic wall thickening and mucosal hyperenhancement and/or mild angiodysplasia given apparent serpiginous appearance of the terminal branch of the ileocolic artery. No bowel ischemia.  Signed by: Verdell Carmine   04/23/2023 5:53 PM    CT abd+pelvis angiogram wo+w contrast    Result Date: 04/23/2023  IMPRESSION: 1. 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 Arteries beyond their origin are patent. 2. Circumferential wall thickening in the ascending and proximal transverse colon, suspicious for colitis which may be ischemic or infectious in etiology. 3. High density foci at the inferior cecum and terminal ileal loops, seen on venous  phase imaging and not included in the field-of-view on the noncontrast and arterial phase scans, suspicious for extravasation at the cecum with retrograde flow into distal ileal loops versus extravasation of the distal ileal loops with some antegrade flow. Consider follow-up CT in 6 to 8 hours and/or IR consultation. These results were discussed with [facility] R. LANDEFELD at 04/23/2023 12:51 PM. Signed by: Albin Fischer   04/23/2023 12:54 PM    US duplex abd limited portable    Result Date: 04/08/2023  IMPRESSION: Patent celiac artery and superior mesenteric artery with borderline, approximately 50%, stenosis of the superior mesenteric artery at the origin. I, Verdell Carmine, M.D., have reviewed this radiological study personally and I am in full agreement with the findings of the report presented here. Signed by: Verdell Carmine   04/08/2023 11:14 PM    CT abd+pelvis angiogram wo+w contrast    Result Date: 04/08/2023  IMPRESSION: High-grade stenosis of the origin of superior mesenteric artery. Clinically correlate for mesenteric angina. No direct CT findings of ischemic enteritis or colitis. Signed by: Carie Caddy   04/08/2023 10:14 AM   Diagnosis:   1. SMA stenosis     Assessment/Plan:     Gabriellia Rempel is a 57 y.o. female with known SMA stenosis and symptoms consistent with chronic mesenteric ischemia presenting with acute abdominal pain on a chronic abdominal pain.   Unclear whether this is acute vs. Chronic and whether her symptoms are primarily of vascular etiology vs infectious vs inflammatory enterocolitis.     - No elements in favor of a bowel ischemia (vitals normal, physical exam not in favor, WBC 6.87, Lactate 10). 2nd CT scan not in favor of a bowel ischemia. Not in favor of a bleeding.     All the patient's questions that arose during the visit were answered to the apparent satisfaction of the patient.    Plan:   - serial abdominal exams  - trend Hg  - fluid resuscitation  - remainder of care per primary team    Author: Saunders Glance. Joanie Coddington, MD 04/23/2023 6:29 PM  General Surgery  [facility]    Patient seen and examined by and plan developed with the attending physician, Dr. Charlane Ferretti.    Addendum: I have seen and examined the patient. I discussed the findings and formulated the plan of care in collaboration with the resident team. I have reviewed the above report, edited it as appropriate, and agree with the documented findings, assessment, and plan of care.     No acute surgical intervention for bowel. Vascular surgery for chronic mesenteric ischemia.   Georgana Curio, MD

## 2023-04-25 LAB — Basic Metabolic Panel
ANION GAP: 11 mmol/L (ref 8–19)
CHLORIDE: 106 mmol/L (ref 96–106)

## 2023-04-25 LAB — Unfractionated Heparin, by anti-Xa: HEPARIN UNFRACTIONATED: 0.61 [IU]/mL

## 2023-04-25 LAB — Magnesium: MAGNESIUM: 1.7 meq/L (ref 1.4–1.9)

## 2023-04-25 LAB — Phosphorus: PHOSPHORUS: 4.1 mg/dL (ref 2.3–4.4)

## 2023-04-25 LAB — CBC: NUCLEATED RBC%, AUTOMATED: 0 (ref 34.9–45.2)

## 2023-04-25 LAB — Differential Automated: IMMATURE GRANULOCYTES%: 0.3 (ref 0.20–0.80)

## 2023-04-25 MED ADMIN — ZZ IMS TEMPLATE: 150 mg | ORAL | @ 15:00:00 | Stop: 2023-05-24 | NDC 60687034911

## 2023-04-25 MED ADMIN — CHOLECALCIFEROL 25 MCG (1000 UT) PO TABS: 50 ug | ORAL | @ 15:00:00 | Stop: 2023-05-01

## 2023-04-25 MED ADMIN — HYDROMORPHONE HCL 1 MG/ML IJ SOLN: .5 mg | INTRAVENOUS | @ 06:00:00 | Stop: 2023-04-25 | NDC 00409128331

## 2023-04-25 MED ADMIN — CHOLESTYRAMINE 4 G PO PACK: 4 g | ORAL | @ 18:00:00 | Stop: 2023-05-01 | NDC 27241013421

## 2023-04-25 MED ADMIN — LACTATED RINGERS IV SOLN: 45 mL/h | INTRAVENOUS | @ 18:00:00 | Stop: 2023-04-27 | NDC 00338011704

## 2023-04-25 MED ADMIN — NICOTINE 7 MG/24HR TD PT24: 7 mg | TRANSDERMAL | @ 15:00:00 | Stop: 2023-05-24 | NDC 00536589488

## 2023-04-25 MED ADMIN — OXYCODONE HCL 5 MG PO TABS: 7.5 mg | ORAL | @ 16:00:00 | Stop: 2023-05-01 | NDC 68084035411

## 2023-04-25 MED ADMIN — RANOLAZINE ER 500 MG PO TB12: 500 mg | ORAL | @ 16:00:00 | Stop: 2023-05-01 | NDC 60687054911

## 2023-04-25 MED ADMIN — LACTATED RINGERS IV SOLN: 75 mL/h | INTRAVENOUS | @ 06:00:00 | Stop: 2023-04-25 | NDC 00338011704

## 2023-04-25 MED ADMIN — HYDROMORPHONE HCL 1 MG/ML IJ SOLN: .5 mg | INTRAVENOUS | @ 01:00:00 | Stop: 2023-04-25 | NDC 00409128331

## 2023-04-25 MED ADMIN — FLUTICASONE-SALMETEROL 250-50 MCG/ACT IN AEPB: 1 | RESPIRATORY_TRACT | @ 17:00:00 | Stop: 2023-05-01 | NDC 00378932132

## 2023-04-25 MED ADMIN — B COMPLEX PO CAPS: 1 | ORAL | @ 15:00:00 | Stop: 2023-05-01

## 2023-04-25 MED ADMIN — ATORVASTATIN CALCIUM 40 MG PO TABS: 40 mg | ORAL | @ 04:00:00 | Stop: 2023-05-01 | NDC 68084009911

## 2023-04-25 MED ADMIN — LACTATED RINGERS IV SOLN: 45 mL/h | INTRAVENOUS | @ 17:00:00 | Stop: 2023-04-27 | NDC 00338011704

## 2023-04-25 MED ADMIN — HEPARIN (PORCINE) IN NACL 25000-0.45 UT/250ML-% IV SOLN: 800 [IU]/h | INTRAVENOUS | @ 06:00:00 | Stop: 2023-04-27 | NDC 63323051701

## 2023-04-25 MED ADMIN — OXYCODONE HCL 5 MG PO TABS: 7.5 mg | ORAL | @ 10:00:00 | Stop: 2023-04-30 | NDC 68084035411

## 2023-04-25 MED ADMIN — CHOLESTYRAMINE 4 G PO PACK: 4 g | ORAL | @ 13:00:00 | Stop: 2023-05-01 | NDC 68382052860

## 2023-04-25 MED ADMIN — ACETAMINOPHEN 500 MG PO TABS: 1000 mg | ORAL | @ 06:00:00 | Stop: 2023-05-01

## 2023-04-25 MED ADMIN — THIAMINE HCL 100 MG PO TABS (MULTI GPI): 100 mg | ORAL | @ 15:00:00 | Stop: 2023-05-01 | NDC 77333093425

## 2023-04-25 MED ADMIN — ACETAMINOPHEN 500 MG PO TABS: 1000 mg | ORAL | @ 23:00:00 | Stop: 2023-05-01

## 2023-04-25 MED ADMIN — FLUTICASONE-SALMETEROL 250-50 MCG/ACT IN AEPB: 1 | RESPIRATORY_TRACT | @ 05:00:00 | Stop: 2023-05-01

## 2023-04-25 MED ADMIN — IPRATROPIUM-ALBUTEROL 20-100 MCG/ACT IN AERS: 2 | RESPIRATORY_TRACT | @ 17:00:00 | Stop: 2023-05-23 | NDC 00597002402

## 2023-04-25 MED ADMIN — ZZ IMS TEMPLATE: 150 mg | ORAL | @ 04:00:00 | Stop: 2023-05-01 | NDC 60687034911

## 2023-04-25 MED ADMIN — IPRATROPIUM-ALBUTEROL 20-100 MCG/ACT IN AERS: 2 | RESPIRATORY_TRACT | @ 05:00:00 | Stop: 2023-05-01 | NDC 00597002402

## 2023-04-25 MED ADMIN — ONDANSETRON HCL 4 MG PO TABS: 4 mg | ORAL | @ 01:00:00 | Stop: 2023-05-23 | NDC 50268062111

## 2023-04-25 MED ADMIN — PANCRELIPASE (LIP-PROT-AMYL) 36000-114000 UNITS PO CPEP: 36000 [IU] | ORAL | @ 15:00:00 | Stop: 2023-05-24 | NDC 00032301613

## 2023-04-25 MED ADMIN — ACETAMINOPHEN 500 MG PO TABS: 1000 mg | ORAL | @ 15:00:00 | Stop: 2023-05-01

## 2023-04-25 MED ADMIN — DULOXETINE HCL 30 MG PO CPEP: 30 mg | ORAL | @ 15:00:00 | Stop: 2023-04-26 | NDC 00904704461

## 2023-04-25 MED ADMIN — GABAPENTIN 100 MG PO CAPS: 100 mg | ORAL | @ 13:00:00 | Stop: 2023-04-25 | NDC 60687058011

## 2023-04-25 MED ADMIN — NICOTINE 7 MG/24HR TD PT24: 7 mg | TRANSDERMAL | @ 15:00:00 | Stop: 2023-05-01

## 2023-04-25 MED ADMIN — GABAPENTIN 100 MG PO CAPS: 200 mg | ORAL | @ 21:00:00 | Stop: 2023-04-27

## 2023-04-25 MED ADMIN — OXYCODONE HCL 5 MG PO TABS: 7.5 mg | ORAL | @ 23:00:00 | Stop: 2023-04-30 | NDC 68084035411

## 2023-04-25 MED ADMIN — HYDROMORPHONE HCL 1 MG/ML IJ SOLN: .5 mg | INTRAVENOUS | @ 13:00:00 | Stop: 2023-04-25 | NDC 00409128331

## 2023-04-25 MED ADMIN — PANTOPRAZOLE SODIUM 40 MG PO TBEC: 40 mg | ORAL | @ 04:00:00 | Stop: 2023-05-01 | NDC 60687073609

## 2023-04-25 MED ADMIN — PANTOPRAZOLE SODIUM 40 MG PO TBEC: 40 mg | ORAL | @ 15:00:00 | Stop: 2023-05-01 | NDC 50268063911

## 2023-04-25 MED ADMIN — GABAPENTIN 100 MG PO CAPS: 100 mg | ORAL | @ 04:00:00 | Stop: 2023-05-24 | NDC 00904666561

## 2023-04-25 MED ADMIN — HYDROMORPHONE HCL 1 MG/ML IJ SOLN: .5 mg | INTRAVENOUS | @ 17:00:00 | Stop: 2023-04-25 | NDC 00409128331

## 2023-04-25 MED ADMIN — CHOLESTYRAMINE 4 G PO PACK: 4 g | ORAL | @ 01:00:00 | Stop: 2023-05-23 | NDC 27241013421

## 2023-04-25 MED ADMIN — PANCRELIPASE (LIP-PROT-AMYL) 36000-114000 UNITS PO CPEP: 36000 [IU] | ORAL | @ 21:00:00 | Stop: 2023-05-01

## 2023-04-25 MED ADMIN — PANCRELIPASE (LIP-PROT-AMYL) 36000-114000 UNITS PO CPEP: 36000 [IU] | ORAL | @ 01:00:00 | Stop: 2023-05-24 | NDC 00032301613

## 2023-04-25 MED ADMIN — OXYCODONE HCL 5 MG PO TABS: 7.5 mg | ORAL | @ 05:00:00 | Stop: 2023-05-01 | NDC 68084035411

## 2023-04-25 MED ADMIN — RANOLAZINE ER 500 MG PO TB12: 500 mg | ORAL | @ 04:00:00 | Stop: 2023-05-24 | NDC 60687054911

## 2023-04-25 MED ADMIN — HYOSCYAMINE SULFATE 0.125 MG PO TABS: 125 ug | ORAL | @ 06:00:00 | Stop: 2023-05-01 | NDC 42192034001

## 2023-04-25 NOTE — Progress Notes
Family Medicine Progress Note    Primary Care Provider: Kelle Darting, MD  Attending Physician: Rogue Jury., MD  Senior Resident Physician: Yetta Flock, MD  Junior Resident Physician: Christoper Allegra. Steffanie Dunn, MD    Chief complaint: Abdominal pain    Subjective:     Overnight events: after episodes of hypotension, dilaudid d/c. BP recovered with 2 L LR bolus and continuous mIVF started. Spotted with dilaudid x2 with 0.5mg .     Today pt is upset about d/c of dilaudid, requesting that it be made PRN again. Discussed that her BP is low and that represents potential of low blood flow to her colon. Discussed we are happy to give IV pain meds when indicated, but want to monitor more closely. Reports her abdominal pain is still present and unchanged from previous.       Pt denies any fevers, chills, vomiting, constipation, sob, or chest pain.    Medications:     Continuous Medications:    heparin 25,000 units/250 mL drip 850 Units/hr (04/24/23 2258)    lactated ringers 75 mL/hr (04/24/23 2303)     Scheduled Medications:     acetaminophen  1,000 mg Oral Q8H    atorvastatin  40 mg Oral QHS    cholestyramine  4 g Oral TID AC    DULoxetine  30 mg Oral Daily    fluticasone-salmeterol  1 puff Inhalation BID    gabapentin  100 mg Oral TID    nicotine patch  7 mg Transdermal Daily    pancrelipase (Lip-Prot-Amyl)  36,000 units of lipase Oral TID w/meals    pantoprazole  40 mg Oral BID    QUEtiapine  150 mg Oral BID    ranolazine  500 mg Oral BID    thiamine  100 mg Oral Daily    vitamin B complex  1 capsule Oral Daily    vitamin D (cholecalciferol)  50 mcg Oral Daily     PRN Medications: albuterol, calcium carbonate, hyoscyamine, ipratropium-albuterol, naloxone, ondansetron, oxyCODONE, oxyCODONE, sodium chloride, traZODone      Physical Exam:     Vital signs in last 24 hours:   Temp:  [36.4 ?C (97.5 ?F)-36.9 ?C (98.4 ?F)] 36.4 ?C (97.5 ?F)  Heart Rate:  [69-98] 81  Resp:  [16-18] 18  BP: (78-111)/(49-74) 111/74  NBP Mean:  [59-85] 85  SpO2:  [92 %-99 %] 94 %     I/O last 2 completed shifts:  In: 1736.1 [P.O.:100; I.V.:486.1; IV Piggyback:1150]  Out: -     General: Alert, comfortable, NAD  Head: Atraumatic, normocephalic  Eyes: PERRL. EOM intact. Sclera are anincteric and noninjected.  Mouth/Throat: Mucous membranes moist  Cardiac: RRR, nl S1/S2, no m/r/g, no thrills on palpation.   Lungs: Respiratory effort nl, CTAB, no crackles or wheezes  Abdomen: Normoactive bowel sounds. No masses or organomegaly appreciated though exam limited by pain.  Guards with palpation. Diffusely TTP. Multiple abdominal surgery scars.   Extr: No lower extremity edema, warm and well-perfused.  Skin: Warm and dry. No significant rashes or lesions noted.  Neuro: A&O x4/4.   Psych: Normal mood and affect      Laboratory Data:     Recent Labs     04/24/23  1555 04/24/23  0302   WBC  --  7.83   HGB  --  9.3*   HCT  --  29.5*   MCV  --  85.0   PLT  --  402*   NEUTPCT  --  49.2   NA 138 139   K 4.5 3.8   CL 105 105   CO2 23 23   BUN 7 8   CREAT 0.97 0.94   MG 2.4* 1.3*   CALCIUM 9.0 9.4       Recent Labs     04/24/23  0302   ALT 7*   AST 18   BILITOT 0.2   ALKPHOS 67   ALBUMIN 3.8*       No results for input(s): ''TSH'', ''HGBA1C'' in the last 72 hours.  No results for input(s): ''CHOL'', ''CHOLHDL'', ''CHOLDLCAL'', ''CHOLDLQ'', ''TRIGLY'' in the last 72 hours.  No results for input(s): ''TROPONIN'', ''BNP'' in the last 72 hours.  Recent Labs     04/23/23  0856   PT 12.6   INR 0.9   APTT 31.1       No results for input(s): ''BACULBLD'', ''BACULUR'', ''BACULRSP'' in the last 72 hours.  No results for input(s): ''GLUCOSEPOC'' in the last 72 hours.    Urinalysis:  Invalid input(s): ''URINEPH'', ''URINEWBC'', ''UAWBC''    Studies:     Imaging:   CT abd+pelvis angiogram wo+w contrast   Final Result by Ellard Artis., MD (05/03 1753)   IMPRESSION:      No active bleed. Near interval resolution of the previously visualized small volume contrast in the terminal ileum and cecum, which may have been transient hemorrhage due to right sided colitis given proximal colonic wall thickening and mucosal    hyperenhancement and/or mild angiodysplasia given apparent serpiginous appearance of the terminal branch of the ileocolic artery. No bowel ischemia.                 Signed by: Verdell Carmine   04/23/2023 5:53 PM      CT abd+pelvis angiogram wo+w contrast   Final Result by Albin Fischer, MD (05/03 1254)   IMPRESSION:         1. 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 Arteries beyond their origin are patent.   2. Circumferential wall thickening in the ascending and proximal transverse colon, suspicious for colitis which may be ischemic or infectious in etiology.   3. High density foci at the inferior cecum and terminal ileal loops, seen on venous phase imaging and not included in the field-of-view on the noncontrast and arterial phase scans, suspicious for extravasation at the cecum with retrograde flow into    distal ileal loops versus extravasation of the distal ileal loops with some antegrade flow. Consider follow-up CT in 6 to 8 hours and/or IR consultation.      These results were discussed with [name] LANDEFELD at 04/23/2023 12:51 PM.               Signed by: Albin Fischer   04/23/2023 12:54 PM          Assessment and Plan:     Donna Gross is a 57 y.o. female with PMHx pf anxiety, depression, emphysema, fibromyalgia, GERD, SMA stenosis who presents with intractable abdominal pain. Likely component of pain is from SMA stenosis. Has been hospitalized many times for similar presentations with negative work besides SMA stenosis. Vascular on-board for procedure to address, though feel pain n/v/d unrelated to SMA.      #Acute on chronic abdominal pain, POA  #SMA stenosis, chronic, POA  #Celiac stenosis, POA  #CT evidence of CAD, POA  Pt been suffering with  abdominal pain for approx. 1 year. Has been admitted several times previously with similar complaints. Recently admitted for similar presentation but CT imaging now with evidence of CT extravasation in cecum and ileum c/f possible worsening ischemia. In addition SMA further stenosis and now mild celiac stenosis in addition. Previously evaluated by vascular surgery and IR for interventions but those teams not favorable to SMA as culprit of pain given negative lactate. New CT findings raise greater c/f SMA occlusion complications. Reassuringly, short interval repeat CT w/o evidence of further ischemia or bleeding. While lactate remains negative, query intermittent elevation not appreciated on our lab collections. Patients symptoms fit with SMA syndrome- worsening postprandial symptoms, food aversion leading to acute refusal, and abdominal pain. Radiology recommends rapid interval f/u CT, will eval and consult specialists for possible interventions/further workup.   -Consult general surgery/ vascular surgery, we appreciate the recs  -Pain management with tylneol ATC, continue oxycodone ss 5/7.5, increase gabapentin 200 mg TID, reserve dilaudid for breakthrough pain given softer Bps with evidence of BP drop with IV meds. Consider dilaudid 0.5mg  IV.   -pantoprazole 40 mg BID  -calcium carbonate PRN  -continue heparin drip for possibility of procedure  -hold home eliquis  -continue atrovastatin 40 mg at bedtime  -daily lactate   -consider colonoscopy      #Diarrhea, chronic, POA  #Nausea/Vomiting, chronic, POA  Pt with chronic nausea/vomiting/diarrhea over last year. So far work up has included negative lab studies: c.diff, celiac panel, h.pylori, parasitic enteric panel. Pt unable to tolerate colonoscopy and negative EGD. Given CT findings of colitis could query infectious etiology but lack of fever, leukocytosis, and sick contacts with fairly recent workup while with current symptoms negative point against. Query poorly controlled GERD. Also consider pain contributing n/v. NO BMS in last 24 hours.   -continue home hyoscyamine 125 mcg q4hrs PRN  -zofran 4mg  q6hrs  -EKG to eval Qtc  -Consider stool studies if infectious signs arise (fever, leukocytosis)     #Poor PO intake, chronic, POA  #Severe protein malnutrition, POA  Previously evaluated by nutrition who had recommended that pt receive boost TID for assistance with malnutrition likely 2/2 poor PO intake from abdominal pain. Pt currently NPO given concern for above. Reports weight loss of approx 50lbs in the past year. Poor appetite 2/2 pain.   -Restart diet when able  -nutrition consultation  -f/u thiamine    #Normocytic anemia, Chronic POA  Pt presents with hgb of 9.8 c/w her baseline of 9-10. Query anemia 2/2 to nutritional deficincies from poor PO intake. From chart review, no recent vitamin or iron studies. Will order to further characterize. Denies SS of bleeding. Folate/B12 WNL. Iron studies c/f for element of IDA. Given current pain and not wanting to mask diarrhea, will hold on iron supplementation for now but consider when more clinically stable  -daily cbc  -consider iron supplementation      #Tobacco use, POA  Previous PPD smoker from age 32 and reduced over last 7 years to now 1 cigarette all day. Requests nicotine patch while IP.   -nicotine patch  -counseled on nicotine cessation     #Anxiety, chronic, POA  #Major Depression, chronic, POA  #Fibromyalgia, chronic, POA   #Insomnia, chronic, POA  Chronic conditions. Will continue home medications.   - Continue Seroquel 150mg  BID  - Increase duloxetine 30 mg qday   - Trazodone 50 mg at bedtime PRN    #FEN/GI:   - Regular diet    #Access: pIV    #  Prophylaxis:  - heparin for therapeutic anticoag in place of home eliquis    #Code: Full Code    DWA Amanie Mcculley, Arlyn Leak., MD who agrees with the above assessment and plan unless otherwise noted in addendum.      Author:  Christoper Allegra. Steffanie Dunn, MD 04/25/2023 7:00 AM   [facility], PGY1      FM Attending Addendum:    I have personally examined the patient and discussed the plan of care with the resident. I am in agreement with the assessments and treatment plans as stated in this document. Patient remains admitted for pain management, serial abdominal exams and fluid resuscitation in the setting of poor PO tolerance.     -Tami Lin, MD

## 2023-04-25 NOTE — Progress Notes
Pharmaceutical Services - Heparin Consult Management Note    Objective Data   Allergies: Hydrocodone, Ibuprofen, and Morphine  Height:   Most recent documented height   04/23/23 1.626 m (5' 4.02'')     Actual Weight:   Most recent documented weight   04/23/23 49 kg       Heparin drip administrations (last 48 hours)       Date/Time Action Medication Dose Rate    04/24/23 2258 New Bag/ Syringe/ Cartridge    heparin 25,000 units in 0.45% NaCl 250 mL drip RTU 850 Units/hr 8.5 mL/hr    04/23/23 2018 New Bag/ Syringe/ Cartridge    heparin 25,000 units in 0.45% NaCl 250 mL drip RTU 850 Units/hr 8.5 mL/hr            Current Labs  Recent Labs     04/23/23  0856   APTT 31.1     Recent Labs     04/25/23  0647   HEPANTIXAUFH 0.61     Recent Labs     04/25/23  0647   WBC 7.11   HGB 8.9*   HCT 29.3*   MCV 88.0   PLT 413*       INR   Date Value Ref Range Status   04/23/2023 0.9 . Final     Comment:     Therapeutic Range: INR 2-3  Mechanical Valves: INR 2.5-3.5       INR (Outside Lab)   Date Value Ref Range Status   03/09/2021 0.90  Final     Bilirubin,Total   Date Value Ref Range Status   04/24/2023 0.2 0.1 - 1.2 mg/dL Final       Assessment:  Protocol: Regular Intensity: Anti-Xa goal 0.3-0.7 or aPTT goal 66-108  Anti-Xa aPTT Re-bolus if Ordered  (NTE 5,000 units) /  Hold Infusion Rate Change Check Anti-Xa/aPTT   <0.1 <46 50 units/kg Increase by 4 units/kg/hr 6 hours after rate change   0.1-0.19 46-55.9 25 units/kg Increase by 3 units/kg/hr    0.2-0.29 56-65.9 - Increase by 2 units/kg/hr    0.3-0.7 66-108 Continue current rate 6 hours;  If therapeutic x2, recheck qAM   0.71-0.8 108.1-118 - Decrease by 1 unit/kg/hr 6 hours after rate change   0.81-0.9 118.1-128 - Decrease by 2 units/kg/hr    0.91-1 128.1-140 Hold for 1 hour Decrease by 3 units/kg/hr    >1 >140 Hold infusion and repeat STAT anti-Xa/aPTT in 1 hour.  If anti-Xa ? 1 or aPTT ? 140, restart infusion and decrease by 4 units/kg/hr. Repeat anti-Xa/aPTT 6 hours after rate change.  If anti-Xa > 1 or aPTT > 140, continue to hold and repeat anti-Xa/aPTT in 1 hour. Notify provider.       Indication:  arterial thrombosis-high grade stenosis   Goal: anti-Xa 0.3-0.7  Dosing weight: 49kg  Concurrent warfarin: No  Concomitant medications that may contribute to bleeding: duloxetine   Pending invasive procedures: TBD possible angioplasty/stent placement at RR  Signs and symptoms of bleeding/bruising within 24 hours: No  Cbc remains stable RN confirmed no neuro changes or s/sx of bleeding observed     Plan:  Donna Gross is a 57 y.o. female who has been referred to pharmacy for therapeutic heparin management.     Bolus: No bolus at this time  Infusion: Continue at 850 units/hr    Next anti-Xa/aPTT ordered at 5/6 @ 0400    Plan was communicated to RN.  Pharmacy will continue to monitor the patient?s clinical progress  and communicate any changes with treatment team.  For questions about this patient's heparin therapy, please call [phone] [facility]) or [phone] [facility]).    Tasia Catchings, PharmD, 04/25/2023, 8:01 AM

## 2023-04-25 NOTE — Other
Patient's Clinical Goal:   Clinical Goal(s) for the Shift: comfort, pai management, vss, safety, heparin management  Identify possible barriers to advancing the care plan: none  Stability of the patient: Moderately Stable - low risk of patient condition declining or worsening   Progression of Patient's Clinical Goal:   Precautions: fall  Pain/n/v: c/o abdominal pain 9-10/10, given prn pain medication with relief, denies n/v  Neuro: aox4  Cardiac: denies chest pain, any cardiac symptoms, BP stable  Respiratory: room air, denies sob  GI: abdomen soft, non distended, no bm this shift, passing gas   GU: voiding  Skin: intact  Activity: as tolerated, able to ambulate   LDA: PIV x2  Plan: Heparin drip as ordered, monitor labs, pain management, IV fluids

## 2023-04-25 NOTE — Other
Patient's Clinical Goal:   Clinical Goal(s) for the Shift: VSS, safety, rest, pain relief, comfort, adequate nutrition, electrolyte replenishment, hep gtt monitoring/mgmt, monitor for bleeding, monitor BM    Identify possible barriers to advancing the care plan: none    Stability of the patient: Moderately Unstable - medium risk of patient condition declining or worsening      Progression of Patient's Clinical Goal  Problem/Goal:   Acute on chronic abdominal pain ~ colitis  Chronic diarrhea/nausea/vomiting  Poor PO intake, malnutrition  Hx - anxiety, depression, emphysema, active smoker (1 cig per day), fibromyalgia, GERD, SMA stenosis w/ post prandial diarrhea + emesis  Response to Plan of Care and any changes in condition (clinical condition, any concerning assessments, events, interventions; response to POC effectiveness or changes made):   A/O x4, VSS, able to communicate needs  Pulse ox monitored on RA, denies SOB or resp distress  Advanced to regular diet, pt feeds self  BMAT 4 - ambulates independently, continent x2, no BM on shift  Anti-Xa: 0.61 - Continued heparin drip @ 850units/hr, no s/s bleeding observed   Continued infusing LR at 66mL/hr, pt tolerating - BP stable  Administered PRN Tums + hyoscyamine per pt c/o cramping/gas  Managed c/o abdominal pain w/ PRN Oxy q4  Administered Dilaudid IVP 0.5mg  spot dose x2 occurrences    Interdisciplinary communication (team member communication):   Endorsed pt's report of unrelieved pain relief w/ current PO analgesic regimen to resident MD Steffanie Dunn - Continue to monitor BP + page for request per pt.     Psychosocial communication (family or patient issues potentially affecting care):   none  Plan and Disposition (progression toward specific goals of care/hospitalization and discharge):  Pain relief  Hep gtt monitoring/mgmt - monitor s/s bleeding  Maintenance fluids  Monitor BM - upload picture

## 2023-04-25 NOTE — Consults
GENERAL SURGERY CONSULTATION     PATIENT: Donna Gross  MRN: [id number]  DOB: 12-25-1965  DATE OF SERVICE: 04/25/2023     PRIMARY CARE PROVIDER: Kelle Darting, MD    Reason for Consultation: Abdominal Pain     Subjective:     History of Present Illness:  Donna Gross is a 57 y.o. female w/ PMHx pf anxiety, depression, emphysema, active smoker (1 cig per day), fibromyalgia, GERD, SMA stenosis who presents to the ED with an acute abdominal pain on a chronic abdominal pain.   The patient has a known SMA stenosis with chronic symptoms (~1 yr) of post-prandial emesis (1-2x per week) of undigested food, abdominal discomfort, and post-prandial diarrhea. Patient reports symptoms began 1 year ago ''after surgery''. 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 Most recent operation was lap chole in 03/2022 c/b subhepatic abscess requiring IR drainage and bile leak requiring ERCP/CBD stent.     She was recently admitted 04/18-04/22 for a similar presentation. During this hospitalization underwent CT ab/pelvis angiogram w/ and w/o contrast which demonstrated high grade stenosis of SMA.     IR and  consulted and did not feel strongly that this was the source of her abdominal pain and recommended against a stenting revascularization as the SMA narrowing is similar to prior studies. Given normal size and patency of the IMA and CA, IR was convinced the SMA narrowing is responsible for the patient's abdominal pain, and therefore stenting would not be beneficial.   The [place] abdo performed on 04/08/2023:   Patent celiac artery and superior mesenteric artery with borderline, approximately 50%, stenosis of the superior mesenteric artery at the origin.     She was started on anticoagulation with eliquis BID during the last hospitalization.    Patient came to the ED today as she reported having an acute diffuse abdominal pain today worse than her usual pain, Had 2 episodes of non-bloody emesis yesterday, and loose stools this morning.   Patients also reports that she is feeling that her belly is swollen.   Denies melena, hematochezia.   Also denies fevers, chills, CP, SOB, sick contacts, dysuria, hematuria.     04/24/2023: AFVSS, pt is still complaining of the same pain. Abdomen still soft and NT today. Is tolerating food. BM *2, passing gas   04/25/2023: AFVSS. pt is still complaining of the same pain. Better PO intake 440/24H. Passing gas, BM+.     Past Medical History:   Past Medical History:   Diagnosis Date    Anxiety     Depression     Emphysema, unspecified (HCC/RAF)     Emphysema, unspecified (HCC/RAF)     Fibromyalgia     Gastritis     GERD (gastroesophageal reflux disease)     History of blood transfusion     Intractable nausea and vomiting 01/23/2020    Pancreatitis        Past Surgical History:   Past Surgical History:   Procedure Laterality Date    ABDOMINAL SURGERY      COLON SURGERY         Scheduled Meds:  Current Facility-Administered Medications   Medication Dose Route Frequency    acetaminophen tab 1,000 mg  1,000 mg Oral Q8H    albuterol 90 mcg/act inh 2 puff  2 puff Inhalation Q6H PRN    atorvastatin tab 40 mg  40 mg Oral QHS    calcium carbonate chew tab 500 mg  500 mg Oral BID PRN    cholestyramine pwd packet 4 g  4 g Oral TID AC    DULoxetine DR cap 30 mg  30 mg Oral Daily    fluticasone-salmeterol 250-50 mcg/act diskus inhaler 1 puff  1 puff Inhalation BID    gabapentin cap 100 mg  100 mg Oral TID    heparin 25,000 units in 0.45% NaCl 250 mL drip RTU  850 Units/hr Intravenous Continuous    heparin per pharmacy   Does not apply Continuous    [COMPLETED] HYDROmorphone 1 mg/mL inj 0.5 mg  0.5 mg IV Push Once    [COMPLETED] HYDROmorphone 1 mg/mL inj 0.5 mg  0.5 mg IV Push Once PRN    [COMPLETED] HYDROmorphone 1 mg/mL inj 0.5 mg  0.5 mg IV Push Once PRN    hyoscyamine tab 125 mcg  125 mcg Oral Q4H PRN    ipratropium-albuterol 20-100 mcg/act inh 2 puff  2 puff Inhalation QID PRN    [COMPLETED] lactated ringers IV soln bolus 1,000 mL  1,000 mL Intravenous Once    [COMPLETED] lactated ringers IV soln bolus 1,000 mL  1,000 mL Intravenous Once    lactated ringers IV soln  75 mL/hr Intravenous Continuous    [COMPLETED] magnesium sulfate 2 g in water for injection 50 mL RTU  2 g Intravenous Once    [COMPLETED] magnesium sulfate 4 g in water for injection 100 mL RTU  4 g Intravenous Once    naloxone 0.4 mg/mL inj 0.4 mg  0.4 mg IV Push PRN    nicotine 7 mg/24 hr patch 7 mg  7 mg Transdermal Daily    ondansetron tab 4 mg  4 mg Oral Q6H PRN    oxyCODONE tab 5 mg  5 mg Oral Q4H PRN    oxyCODONE tab 7.5 mg  7.5 mg Oral Q4H PRN    pancrelipase (Lip-Prot-Amyl) (Creon) DR cap 36,000 units of lipase  36,000 units of lipase Oral TID w/meals    pantoprazole DR tab 40 mg  40 mg Oral BID    QUEtiapine tab 150 mg  150 mg Oral BID    ranolazine tab ER12 500 mg  500 mg Oral BID    sodium chloride 0.9% IV soln  10 mL/hr Intravenous PRN    thiamine tab 100 mg  100 mg Oral Daily    traZODone tab 50 mg  50 mg Oral QHS PRN    vitamin B complex cap 1 capsule  1 capsule Oral Daily    vitamin D (cholecalciferol) tab 50 mcg  50 mcg Oral Daily    [DISCONTINUED] cyanocobalamin tab 1,000 mcg  1,000 mcg Oral Daily    [DISCONTINUED] DULoxetine DR cap 30 mg  30 mg Oral Every Other Day    [DISCONTINUED] gabapentin cap 100 mg  100 mg Oral QHS    [DISCONTINUED] HYDROmorphone 1 mg/mL inj 0.5 mg  0.5 mg IV Push Once    [DISCONTINUED] HYDROmorphone 1 mg/mL inj 1 mg  1 mg IV Push Q4H PRN    [DISCONTINUED] lactated ringers IV soln bolus 1,000 mL  1,000 mL Intravenous Once    [DISCONTINUED] lactated ringers IV soln  90 mL/hr Intravenous Continuous    [DISCONTINUED] oxyCODONE tab 2.5 mg  2.5 mg Oral Q4H PRN    [DISCONTINUED] oxyCODONE tab 5 mg  5 mg  Oral Q4H PRN Allergies: Hydrocodone, Ibuprofen, and Morphine    Family History:   Family History   Problem Relation Age of Onset    Heart disease Mother     Diabetes Brother     Diabetes Father     Anesthesia problems Neg Hx     Malignant hypertension Neg Hx     Hypotension Neg Hx     Malignant hyperthermia Neg Hx     Pseudochol deficiency Neg Hx        Social History:   Social History     Socioeconomic History    Marital status: Single   Tobacco Use    Smoking status: Every Day     Years: 20     Types: Cigarettes    Smokeless tobacco: Current    Tobacco comments:     1 cig per day   Substance and Sexual Activity    Alcohol use: No    Drug use: No   Social History Narrative    Lives w/ mom and daughter     Social Determinants of Health     Financial Resource Strain: Low Risk  (04/12/2023)    Financial Resource Strain     Difficulty of Paying Living Expenses: Not very hard       Objective:     Physical Exam:  Last Recorded Vital Signs:    04/25/23 0313   BP: 111/74   Pulse: 81   Resp: 18   Temp: 36.4 ?C (97.5 ?F)   SpO2: 94%     Body mass index is 18.53 kg/m?Marland Kitchen   Vitals:    04/23/23 1654   Weight: 49 kg (108 lb)   Height: 1.626 m (5' 4.02'')       Vitals:    04/23/23 1654   Weight: 49 kg (108 lb)   Height: 1.626 m (5' 4.02'')     General: The pateint is a well-appearing, well-nourished, in no acute distress.The patient is awake, alert, oriented, and answers questions appropriately.   Head: Normocephalic, atraumatic.   Eyes: Extraocular movements are intact. There is no scleral icterus.   Heart: SR  Chest:  No use of accessory muscle  Abdomen: Soft and non-tender.  Extremities:  No clubbing, cyanosis, or edema.    Laboratory Review:  Lab Results   Component Value Date    WBC 7.83 04/24/2023    HGB 9.3 (L) 04/24/2023    HCT 29.5 (L) 04/24/2023    MCV 85.0 04/24/2023    PLT 402 (H) 04/24/2023     Recent Results (from the past 24 hour(s))   Unfractionated Heparin (UFH), by anti-Xa    Collection Time: 04/24/23  9:24 AM   Result Value Ref Range    Anti-Xa, UFH 0.55   IU/mL   Sepsis Lactate    Collection Time: 04/24/23 12:28 PM   Result Value Ref Range    Blood Lactate 31 (H) 5 - 18 mg/dL   Basic Metabolic Panel    Collection Time: 04/24/23  3:55 PM   Result Value Ref Range    Sodium 138 135 - 146 mmol/L    Potassium 4.5 3.6 - 5.3 mmol/L    Chloride 105 96 - 106 mmol/L    Total CO2 23 20 - 30 mmol/L    Anion Gap 10 8 - 19 mmol/L    Glucose 111 (H) 65 - 99 mg/dL    Creatinine 4.54 0.98 - 1.30 mg/dL    Estimated GFR 68 See GFR Additional Information mL/min/1.95m2  GFR Additional Information See Comment     Urea Nitrogen 7 7 - 22 mg/dL    Calcium 9.0 8.6 - 16.1 mg/dL   Magnesium    Collection Time: 04/24/23  3:55 PM   Result Value Ref Range    Magnesium 2.4 (H) 1.4 - 1.9 mEq/L   Phosphorus    Collection Time: 04/24/23  3:55 PM   Result Value Ref Range    Phosphorus 3.8 2.3 - 4.4 mg/dL   Sepsis Lactate Repeat    Collection Time: 04/24/23  3:56 PM   Result Value Ref Range    Blood Lactate 16 5 - 18 mg/dL       Imaging Review:   Repeat CT scan after the first one 04/23/2023, at 05:30PM to r/o a colonic  bleeding  IMPRESSION:  No active bleed. Near interval resolution of the previously visualized small volume contrast in the terminal ileum and cecum, which may have been transient hemorrhage due to right sided colitis given proximal colonic wall thickening and mucosal   hyperenhancement and/or mild angiodysplasia given apparent serpiginous appearance of the terminal branch of the ileocolic artery. No bowel ischemia.   Signed by: Verdell Carmine   04/23/2023 5:53 PM    CT abd+pelvis angiogram wo+w contrast    Result Date: 04/23/2023  IMPRESSION: No active bleed. Near interval resolution of the previously visualized small volume contrast in the terminal ileum and cecum, which may have been transient hemorrhage due to right sided colitis given proximal colonic wall thickening and mucosal hyperenhancement and/or mild angiodysplasia given apparent serpiginous appearance of the terminal branch of the ileocolic artery. No bowel ischemia.  Signed by: Verdell Carmine   04/23/2023 5:53 PM    CT abd+pelvis angiogram wo+w contrast    Result Date: 04/23/2023  IMPRESSION: 1. 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 Arteries beyond their origin are patent. 2. Circumferential wall thickening in the ascending and proximal transverse colon, suspicious for colitis which may be ischemic or infectious in etiology. 3. High density foci at the inferior cecum and terminal ileal loops, seen on venous phase imaging and not included in the field-of-view on the noncontrast and arterial phase scans, suspicious for extravasation at the cecum with retrograde flow into distal ileal loops versus extravasation of the distal ileal loops with some antegrade flow. Consider follow-up CT in 6 to 8 hours and/or IR consultation. These results were discussed with [facility] R. LANDEFELD at 04/23/2023 12:51 PM. Signed by: Albin Fischer   04/23/2023 12:54 PM    US duplex abd limited portable    Result Date: 04/08/2023  IMPRESSION: Patent celiac artery and superior mesenteric artery with borderline, approximately 50%, stenosis of the superior mesenteric artery at the origin. I, Verdell Carmine, M.D., have reviewed this radiological study personally and I am in full agreement with the findings of the report presented here. Signed by: Verdell Carmine   04/08/2023 11:14 PM    CT abd+pelvis angiogram wo+w contrast    Result Date: 04/08/2023  IMPRESSION: High-grade stenosis of the origin of superior mesenteric artery. Clinically correlate for mesenteric angina. No direct CT findings of ischemic enteritis or colitis. Signed by: Carie Caddy   04/08/2023 10:14 AM   Diagnosis:   1. SMA stenosis     Assessment/Plan:     Donna Gross is a 57 y.o. female with known SMA stenosis and symptoms consistent with chronic mesenteric ischemia presenting with acute abdominal  pain on a chronic abdominal pain.   Unclear whether this is acute vs. Chronic and whether her symptoms are primarily of vascular etiology vs infectious vs inflammatory enterocolitis.     - No elements in favor of a bowel ischemia (vitals normal, physical exam not in favor, WBC 6.87, Lactate 10). 2nd CT scan not in favor of a bowel ischemia. Not in favor of a bleeding.     All the patient's questions that arose during the visit were answered to the apparent satisfaction of the patient.    Plan:   - Hemoglobin remains stable  - The general surgery team will sign off  - remainder of care per primary team    Author: Isaiah Serge, MD 04/25/2023 7:45 AM  General Surgery  [facility]    Patient seen and examined by and plan developed with the attending physician, Dr. Charlane Ferretti.    Addendum: I participated in all critical portions of this patient's consultation, discussed the findings, and formulated the plan of care in collaboration with the resident team. I have reviewed the above report, edited it as appropriate, and agree with the documented findings, assessment, and plan of care.     No acute surgical intervention for bowel. Vascular surgery for chronic mesenteric ischemia.   Georgana Curio, MD

## 2023-04-25 NOTE — Progress Notes
VASCULAR SURGERY CONSULTATION - PROGRESS NOTE    PATIENT: Donna Gross  MRN: [id number]  DOB: [birth year]  DATE OF SERVICE: 04/25/2023     PRIMARY CARE PROVIDER: Kelle Darting, MD    Reason for Consultation: Abdominal Pain     Subjective:     History of Present Illness:  Donna Gross is a 57 y.o. female w/ PMHx pf anxiety, depression, emphysema, active smoker (1 cig per day), fibromyalgia, GERD, SMA stenosis who presents to the ED with an acute abdominal pain on a chronic abdominal pain.   The patient has a known SMA stenosis with chronic symptoms (~1 yr) of post-prandial emesis (1-2x per week) of undigested food, abdominal discomfort, and post-prandial diarrhea. Patient reports symptoms began 1 year ago ''after surgery''. 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 Most recent operation was lap chole in 03/2022 c/b subhepatic abscess requiring IR drainage and bile leak requiring ERCP/CBD stent.     She was recently admitted 04/18-04/22 for a similar presentation. During this hospitalization underwent CT ab/pelvis angiogram w/ and w/o contrast which demonstrated high grade stenosis of SMA.     IR and  consulted and did not feel strongly that this was the source of her abdominal pain and recommended against a stenting revascularization as the SMA narrowing is similar to prior studies. Given normal size and patency of the IMA and CA, IR was convinced the SMA narrowing is responsible for the patient's abdominal pain, and therefore stenting would not be beneficial.   The [place] abdo performed on 04/08/2023:   Patent celiac artery and superior mesenteric artery with borderline, approximately 50%, stenosis of the superior mesenteric artery at the origin.     She was started on anticoagulation with eliquis BID during the last hospitalization.    Patient came to the ED today as she reported having an acute diffuse abdominal pain today worse than her usual pain, Had 2 episodes of non-bloody emesis yesterday, and loose stools this morning.   Patients also reports that she is feeling that her belly is swollen.   Denies melena, hematochezia.   Also denies fevers, chills, CP, SOB, sick contacts, dysuria, hematuria.       04/24/2023: AFVSS, pt is still complaining of the same pain. Abdomen still soft and NT today. Is tolerating food. BM *2, passing gas.  04/25/2023: AFVSS. pt is still complaining of the same pain. Better PO intake 440/24H. Passing gas, BM+.    Past Medical History:   Past Medical History:   Diagnosis Date    Anxiety     Depression     Emphysema, unspecified (HCC/RAF)     Emphysema, unspecified (HCC/RAF)     Fibromyalgia     Gastritis     GERD (gastroesophageal reflux disease)     History of blood transfusion     Intractable nausea and vomiting 01/23/2020    Pancreatitis        Past Surgical History:   Past Surgical History:   Procedure Laterality Date    ABDOMINAL SURGERY      COLON SURGERY         Scheduled Meds:  Current Facility-Administered Medications   Medication Dose Route Frequency    acetaminophen tab 1,000 mg  1,000 mg Oral Q8H    albuterol 90 mcg/act inh 2  puff  2 puff Inhalation Q6H PRN    atorvastatin tab 40 mg  40 mg Oral QHS    calcium carbonate chew tab 500 mg  500 mg Oral BID PRN    cholestyramine pwd packet 4 g  4 g Oral TID AC    DULoxetine DR cap 30 mg  30 mg Oral Daily    fluticasone-salmeterol 250-50 mcg/act diskus inhaler 1 puff  1 puff Inhalation BID    gabapentin cap 100 mg  100 mg Oral TID    heparin 25,000 units in 0.45% NaCl 250 mL drip RTU  850 Units/hr Intravenous Continuous    heparin per pharmacy   Does not apply Continuous    [COMPLETED] HYDROmorphone 1 mg/mL inj 0.5 mg  0.5 mg IV Push Once    [COMPLETED] HYDROmorphone 1 mg/mL inj 0.5 mg  0.5 mg IV Push Once PRN [COMPLETED] HYDROmorphone 1 mg/mL inj 0.5 mg  0.5 mg IV Push Once PRN    hyoscyamine tab 125 mcg  125 mcg Oral Q4H PRN    ipratropium-albuterol 20-100 mcg/act inh 2 puff  2 puff Inhalation QID PRN    [COMPLETED] lactated ringers IV soln bolus 1,000 mL  1,000 mL Intravenous Once    [COMPLETED] lactated ringers IV soln bolus 1,000 mL  1,000 mL Intravenous Once    lactated ringers IV soln  75 mL/hr Intravenous Continuous    [COMPLETED] magnesium sulfate 2 g in water for injection 50 mL RTU  2 g Intravenous Once    [COMPLETED] magnesium sulfate 4 g in water for injection 100 mL RTU  4 g Intravenous Once    naloxone 0.4 mg/mL inj 0.4 mg  0.4 mg IV Push PRN    nicotine 7 mg/24 hr patch 7 mg  7 mg Transdermal Daily    ondansetron tab 4 mg  4 mg Oral Q6H PRN    oxyCODONE tab 5 mg  5 mg Oral Q4H PRN    oxyCODONE tab 7.5 mg  7.5 mg Oral Q4H PRN    pancrelipase (Lip-Prot-Amyl) (Creon) DR cap 36,000 units of lipase  36,000 units of lipase Oral TID w/meals    pantoprazole DR tab 40 mg  40 mg Oral BID    QUEtiapine tab 150 mg  150 mg Oral BID    ranolazine tab ER12 500 mg  500 mg Oral BID    sodium chloride 0.9% IV soln  10 mL/hr Intravenous PRN    thiamine tab 100 mg  100 mg Oral Daily    traZODone tab 50 mg  50 mg Oral QHS PRN    vitamin B complex cap 1 capsule  1 capsule Oral Daily    vitamin D (cholecalciferol) tab 50 mcg  50 mcg Oral Daily    [DISCONTINUED] cyanocobalamin tab 1,000 mcg  1,000 mcg Oral Daily    [DISCONTINUED] DULoxetine DR cap 30 mg  30 mg Oral Every Other Day    [DISCONTINUED] gabapentin cap 100 mg  100 mg Oral QHS    [DISCONTINUED] HYDROmorphone 1 mg/mL inj 0.5 mg  0.5 mg IV Push Once    [DISCONTINUED] HYDROmorphone 1 mg/mL inj 1 mg  1 mg IV Push Q4H PRN    [DISCONTINUED] lactated ringers IV soln bolus 1,000 mL  1,000 mL Intravenous Once    [DISCONTINUED] lactated ringers IV soln  90 mL/hr Intravenous Continuous    [DISCONTINUED] oxyCODONE tab 2.5 mg  2.5 mg Oral Q4H PRN    [DISCONTINUED] oxyCODONE tab 5 mg 5 mg Oral Q4H  PRN       Allergies: Hydrocodone, Ibuprofen, and Morphine    Family History:   Family History   Problem Relation Age of Onset    Heart disease Mother     Diabetes Brother     Diabetes Father     Anesthesia problems Neg Hx     Malignant hypertension Neg Hx     Hypotension Neg Hx     Malignant hyperthermia Neg Hx     Pseudochol deficiency Neg Hx        Social History:   Social History     Socioeconomic History    Marital status: Single   Tobacco Use    Smoking status: Every Day     Years: 20     Types: Cigarettes    Smokeless tobacco: Current    Tobacco comments:     1 cig per day   Substance and Sexual Activity    Alcohol use: No    Drug use: No   Social History Narrative    Lives w/ mom and daughter     Social Determinants of Health     Financial Resource Strain: Low Risk  (04/12/2023)    Financial Resource Strain     Difficulty of Paying Living Expenses: Not very hard       Objective:     Physical Exam:  Last Recorded Vital Signs:    04/25/23 0313   BP: 111/74   Pulse: 81   Resp: 18   Temp: 36.4 ?C (97.5 ?F)   SpO2: 94%     Body mass index is 18.53 kg/m?Marland Kitchen   Vitals:    04/23/23 1654   Weight: 49 kg (108 lb)   Height: 1.626 m (5' 4.02'')       Vitals:    04/23/23 1654   Weight: 49 kg (108 lb)   Height: 1.626 m (5' 4.02'')     General: The pateint is a well-appearing, well-nourished, in no acute distress.The patient is awake, alert, oriented, and answers questions appropriately.   Head: Normocephalic, atraumatic.   Eyes: Extraocular movements are intact. There is no scleral icterus.   Heart: SR  Chest:  No use of accessory muscle  Abdomen: Soft and non-tender.  Extremities:  No clubbing, cyanosis, or edema.    Laboratory Review:  Lab Results   Component Value Date    WBC 7.83 04/24/2023    HGB 9.3 (L) 04/24/2023    HCT 29.5 (L) 04/24/2023    MCV 85.0 04/24/2023    PLT 402 (H) 04/24/2023     Recent Results (from the past 24 hour(s))   Unfractionated Heparin (UFH), by anti-Xa    Collection Time: 04/24/23 9:24 AM   Result Value Ref Range    Anti-Xa, UFH 0.55   IU/mL   Sepsis Lactate    Collection Time: 04/24/23 12:28 PM   Result Value Ref Range    Blood Lactate 31 (H) 5 - 18 mg/dL   Basic Metabolic Panel    Collection Time: 04/24/23  3:55 PM   Result Value Ref Range    Sodium 138 135 - 146 mmol/L    Potassium 4.5 3.6 - 5.3 mmol/L    Chloride 105 96 - 106 mmol/L    Total CO2 23 20 - 30 mmol/L    Anion Gap 10 8 - 19 mmol/L    Glucose 111 (H) 65 - 99 mg/dL    Creatinine 1.61 0.96 - 1.30 mg/dL    Estimated GFR 68 See GFR  Additional Information mL/min/1.33m2    GFR Additional Information See Comment     Urea Nitrogen 7 7 - 22 mg/dL    Calcium 9.0 8.6 - 78.4 mg/dL   Magnesium    Collection Time: 04/24/23  3:55 PM   Result Value Ref Range    Magnesium 2.4 (H) 1.4 - 1.9 mEq/L   Phosphorus    Collection Time: 04/24/23  3:55 PM   Result Value Ref Range    Phosphorus 3.8 2.3 - 4.4 mg/dL   Sepsis Lactate Repeat    Collection Time: 04/24/23  3:56 PM   Result Value Ref Range    Blood Lactate 16 5 - 18 mg/dL       Imaging Review:   Repeat CT scan after the first one 04/23/2023, at 05:30PM to r/o a colonic  bleeding  IMPRESSION:  No active bleed. Near interval resolution of the previously visualized small volume contrast in the terminal ileum and cecum, which may have been transient hemorrhage due to right sided colitis given proximal colonic wall thickening and mucosal   hyperenhancement and/or mild angiodysplasia given apparent serpiginous appearance of the terminal branch of the ileocolic artery. No bowel ischemia.   Signed by: Verdell Carmine   04/23/2023 5:53 PM    CT abd+pelvis angiogram wo+w contrast    Result Date: 04/23/2023  IMPRESSION: No active bleed. Near interval resolution of the previously visualized small volume contrast in the terminal ileum and cecum, which may have been transient hemorrhage due to right sided colitis given proximal colonic wall thickening and mucosal hyperenhancement and/or mild angiodysplasia given apparent serpiginous appearance of the terminal branch of the ileocolic artery. No bowel ischemia.  Signed by: Verdell Carmine   04/23/2023 5:53 PM    CT abd+pelvis angiogram wo+w contrast    Result Date: 04/23/2023  IMPRESSION: 1. 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 Arteries beyond their origin are patent. 2. Circumferential wall thickening in the ascending and proximal transverse colon, suspicious for colitis which may be ischemic or infectious in etiology. 3. High density foci at the inferior cecum and terminal ileal loops, seen on venous phase imaging and not included in the field-of-view on the noncontrast and arterial phase scans, suspicious for extravasation at the cecum with retrograde flow into distal ileal loops versus extravasation of the distal ileal loops with some antegrade flow. Consider follow-up CT in 6 to 8 hours and/or IR consultation. These results were discussed with [facility] R. LANDEFELD at 04/23/2023 12:51 PM. Signed by: Albin Fischer   04/23/2023 12:54 PM    US duplex abd limited portable    Result Date: 04/08/2023  IMPRESSION: Patent celiac artery and superior mesenteric artery with borderline, approximately 50%, stenosis of the superior mesenteric artery at the origin. I, Verdell Carmine, M.D., have reviewed this radiological study personally and I am in full agreement with the findings of the report presented here. Signed by: Verdell Carmine   04/08/2023 11:14 PM    CT abd+pelvis angiogram wo+w contrast    Result Date: 04/08/2023  IMPRESSION: High-grade stenosis of the origin of superior mesenteric artery. Clinically correlate for mesenteric angina. No direct CT findings of ischemic enteritis or colitis. Signed by: Carie Caddy   04/08/2023 10:14 AM   Diagnosis:   1. SMA stenosis     Assessment/Plan:     Donna Gross is a 57 y.o. female with known SMA stenosis and symptoms consistent with chronic  mesenteric ischemia presenting with acute abdominal pain on a chronic abdominal pain.   Unclear whether this is acute vs. Chronic and whether her symptoms are primarily of vascular etiology.     - No elements in favor of a bowel ischemia (vitals normal, physical exam not in favor, WBC 6.87, Lactate 10). 2nd CT scan not in favor of a bowel ischemia. Not in favor of a bleeding.     All the patient's questions that arose during the visit were answered to the apparent satisfaction of the patient.    Plan:   - serial abdominal exam  - we recommend an angiogram: will be coordinate with medicine team for the timing and location.   - The general surgery team will sign-off  - Rest of care per primary team.    Author: Isaiah Serge, MD 04/25/2023 7:44 AM  General Surgery  [facility]    Patient seen and examined by and plan developed with the attending physician, Dr. Keene Breath.    I spent  30-45  minutes face-to-face with the patient, care coordination, reviewing imaging, pertinent lab findings, and medical decision making. Over half in the discussion of the diagnosis and the discussed risks and benefits of surgery and alternative options.

## 2023-04-26 ENCOUNTER — Ambulatory Visit: Payer: PRIVATE HEALTH INSURANCE | Attending: Vascular Surgery

## 2023-04-26 DIAGNOSIS — M797 Fibromyalgia: Secondary | ICD-10-CM

## 2023-04-26 DIAGNOSIS — F329 Major depressive disorder, single episode, unspecified: Secondary | ICD-10-CM

## 2023-04-26 DIAGNOSIS — F419 Anxiety disorder, unspecified: Secondary | ICD-10-CM

## 2023-04-26 DIAGNOSIS — D649 Anemia, unspecified: Secondary | ICD-10-CM

## 2023-04-26 DIAGNOSIS — E43 Unspecified severe protein-calorie malnutrition: Secondary | ICD-10-CM

## 2023-04-26 DIAGNOSIS — F339 Major depressive disorder, recurrent, unspecified: Secondary | ICD-10-CM

## 2023-04-26 LAB — Phosphorus: PHOSPHORUS: 4 mg/dL (ref 2.3–4.4)

## 2023-04-26 LAB — Basic Metabolic Panel
CHLORIDE: 106 mmol/L (ref 96–106)
POTASSIUM: 4.1 mmol/L (ref 3.6–5.3)

## 2023-04-26 LAB — CBC: WHITE BLOOD CELL COUNT: 6.49 10*3/uL (ref 4.16–9.95)

## 2023-04-26 LAB — Unfractionated Heparin, by anti-Xa
HEPARIN UNFRACTIONATED: 0.75 [IU]/mL
HEPARIN UNFRACTIONATED: 0.37 [IU]/mL

## 2023-04-26 LAB — Magnesium: MAGNESIUM: 1.4 meq/L (ref 1.4–1.9)

## 2023-04-26 LAB — Differential Automated: BASOPHIL PERCENT, AUTO: 0.5 (ref 1.80–6.90)

## 2023-04-26 MED ADMIN — THIAMINE HCL 100 MG PO TABS (MULTI GPI): 100 mg | ORAL | @ 16:00:00 | Stop: 2023-05-01 | NDC 77333093425

## 2023-04-26 MED ADMIN — RANOLAZINE ER 500 MG PO TB12: 500 mg | ORAL | @ 16:00:00 | Stop: 2023-05-01 | NDC 60687054911

## 2023-04-26 MED ADMIN — CHOLESTYRAMINE 4 G PO PACK: 4 g | ORAL | @ 18:00:00 | Stop: 2023-05-01 | NDC 27241013421

## 2023-04-26 MED ADMIN — FLUTICASONE-SALMETEROL 250-50 MCG/ACT IN AEPB: 1 | RESPIRATORY_TRACT | @ 03:00:00 | Stop: 2023-05-01

## 2023-04-26 MED ADMIN — ATORVASTATIN CALCIUM 40 MG PO TABS: 40 mg | ORAL | @ 03:00:00 | Stop: 2023-05-01 | NDC 68084009911

## 2023-04-26 MED ADMIN — PANCRELIPASE (LIP-PROT-AMYL) 36000-114000 UNITS PO CPEP: 36000 [IU] | ORAL | @ 02:00:00 | Stop: 2023-05-01

## 2023-04-26 MED ADMIN — GABAPENTIN 100 MG PO CAPS: 200 mg | ORAL | @ 18:00:00 | Stop: 2023-04-27 | NDC 60687058011

## 2023-04-26 MED ADMIN — ZZ IMS TEMPLATE: 150 mg | ORAL | @ 03:00:00 | Stop: 2023-05-24 | NDC 60687034911

## 2023-04-26 MED ADMIN — ZZ IMS TEMPLATE: 150 mg | ORAL | @ 16:00:00 | Stop: 2023-05-24 | NDC 60687034911

## 2023-04-26 MED ADMIN — GABAPENTIN 100 MG PO CAPS: 200 mg | ORAL | @ 12:00:00 | Stop: 2023-04-27 | NDC 60687058011

## 2023-04-26 MED ADMIN — MAGNESIUM SULFATE 2 GM/50ML IV SOLN: 2 g | INTRAVENOUS | @ 16:00:00 | Stop: 2023-04-26 | NDC 25021061281

## 2023-04-26 MED ADMIN — NICOTINE 7 MG/24HR TD PT24: 7 mg | TRANSDERMAL | @ 16:00:00 | Stop: 2023-05-24

## 2023-04-26 MED ADMIN — ACETAMINOPHEN 500 MG PO TABS: 1000 mg | ORAL | @ 07:00:00 | Stop: 2023-05-23

## 2023-04-26 MED ADMIN — RANOLAZINE ER 500 MG PO TB12: 500 mg | ORAL | @ 03:00:00 | Stop: 2023-05-24 | NDC 60687054911

## 2023-04-26 MED ADMIN — POLYETHYLENE GLYCOL 3350 17 G PO PACK: 17 g | ORAL | @ 16:00:00 | Stop: 2023-04-27 | NDC 60687043199

## 2023-04-26 MED ADMIN — PANCRELIPASE (LIP-PROT-AMYL) 36000-114000 UNITS PO CPEP: 36000 [IU] | ORAL | @ 18:00:00 | Stop: 2023-05-24 | NDC 00032301613

## 2023-04-26 MED ADMIN — HYDROMORPHONE HCL 1 MG/ML IJ SOLN: .5 mg | INTRAVENOUS | Stop: 2023-04-26 | NDC 00409128331

## 2023-04-26 MED ADMIN — GABAPENTIN 100 MG PO CAPS: 200 mg | ORAL | @ 03:00:00 | Stop: 2023-04-27 | NDC 00904666561

## 2023-04-26 MED ADMIN — CALCIUM CARBONATE ANTACID 500 (200 CA) MG PO CHEW: 500 mg | ORAL | @ 18:00:00 | Stop: 2023-04-27 | NDC 68084098833

## 2023-04-26 MED ADMIN — LACTATED RINGERS IV SOLN: 45 mL/h | INTRAVENOUS | @ 18:00:00 | Stop: 2023-04-27

## 2023-04-26 MED ADMIN — OXYCODONE HCL 5 MG PO TABS: 7.5 mg | ORAL | @ 09:00:00 | Stop: 2023-05-01 | NDC 68084035411

## 2023-04-26 MED ADMIN — HYDROMORPHONE HCL 1 MG/ML IJ SOLN: .5 mg | INTRAVENOUS | @ 11:00:00 | Stop: 2023-04-27 | NDC 00409128331

## 2023-04-26 MED ADMIN — CALCIUM CARBONATE ANTACID 500 (200 CA) MG PO CHEW: 500 mg | ORAL | @ 01:00:00 | Stop: 2023-04-27 | NDC 68084098833

## 2023-04-26 MED ADMIN — LACTATED RINGERS IV SOLN: 45 mL/h | INTRAVENOUS | @ 03:00:00 | Stop: 2023-04-27 | NDC 00338011704

## 2023-04-26 MED ADMIN — PANCRELIPASE (LIP-PROT-AMYL) 36000-114000 UNITS PO CPEP: 36000 [IU] | ORAL | @ 16:00:00 | Stop: 2023-05-24 | NDC 00032301613

## 2023-04-26 MED ADMIN — NICOTINE 7 MG/24HR TD PT24: 7 mg | TRANSDERMAL | @ 16:00:00 | Stop: 2023-05-24 | NDC 00536589488

## 2023-04-26 MED ADMIN — HYDROMORPHONE HCL 1 MG/ML IJ SOLN: .5 mg | INTRAVENOUS | @ 17:00:00 | Stop: 2023-04-27 | NDC 00409128331

## 2023-04-26 MED ADMIN — FLUTICASONE-SALMETEROL 250-50 MCG/ACT IN AEPB: 1 | RESPIRATORY_TRACT | @ 18:00:00 | Stop: 2023-05-01

## 2023-04-26 MED ADMIN — HYOSCYAMINE SULFATE 0.125 MG PO TABS: 125 ug | ORAL | @ 01:00:00 | Stop: 2023-05-01 | NDC 42192034001

## 2023-04-26 MED ADMIN — CHOLESTYRAMINE 4 G PO PACK: 4 g | ORAL | Stop: 2023-05-23

## 2023-04-26 MED ADMIN — HEPARIN (PORCINE) IN NACL 25000-0.45 UT/250ML-% IV SOLN: 800 [IU]/h | INTRAVENOUS | @ 14:00:00 | Stop: 2023-05-24

## 2023-04-26 MED ADMIN — PANTOPRAZOLE SODIUM 40 MG PO TBEC: 40 mg | ORAL | @ 16:00:00 | Stop: 2023-05-01 | NDC 50268063911

## 2023-04-26 MED ADMIN — HEPARIN (PORCINE) IN NACL 25000-0.45 UT/250ML-% IV SOLN: 800 [IU]/h | INTRAVENOUS | @ 14:00:00 | Stop: 2023-04-27

## 2023-04-26 MED ADMIN — ACETAMINOPHEN 500 MG PO TABS: 1000 mg | ORAL | @ 16:00:00 | Stop: 2023-05-23 | NDC 00904673061

## 2023-04-26 MED ADMIN — B COMPLEX PO CAPS: 1 | ORAL | @ 16:00:00 | Stop: 2023-05-24

## 2023-04-26 MED ADMIN — SODIUM CHLORIDE 0.9% IV SOLN (250 ML): 10 mL/h | INTRAVENOUS | @ 16:00:00 | Stop: 2023-05-01 | NDC 00338004902

## 2023-04-26 MED ADMIN — OXYCODONE HCL 5 MG PO TABS: 7.5 mg | ORAL | @ 22:00:00 | Stop: 2023-05-01 | NDC 68084035411

## 2023-04-26 MED ADMIN — PANTOPRAZOLE SODIUM 40 MG PO TBEC: 40 mg | ORAL | @ 03:00:00 | Stop: 2023-05-04 | NDC 50268063911

## 2023-04-26 MED ADMIN — CHOLECALCIFEROL 25 MCG (1000 UT) PO TABS: 50 ug | ORAL | @ 16:00:00 | Stop: 2023-05-01

## 2023-04-26 MED ADMIN — OXYCODONE HCL 5 MG PO TABS: 7.5 mg | ORAL | @ 14:00:00 | Stop: 2023-05-01 | NDC 68084035411

## 2023-04-26 MED ADMIN — LACTATED RINGERS IV SOLN: 45 mL/h | INTRAVENOUS | @ 18:00:00 | Stop: 2023-04-27 | NDC 00338011704

## 2023-04-26 MED ADMIN — HYDROMORPHONE HCL 1 MG/ML IJ SOLN: .5 mg | INTRAVENOUS | @ 04:00:00 | Stop: 2023-04-27 | NDC 00409128331

## 2023-04-26 MED ADMIN — HEPARIN (PORCINE) IN NACL 25000-0.45 UT/250ML-% IV SOLN: 800 [IU]/h | INTRAVENOUS | @ 09:00:00 | Stop: 2023-04-27 | NDC 63323051701

## 2023-04-26 MED ADMIN — DULOXETINE HCL 30 MG PO CPEP: 30 mg | ORAL | @ 16:00:00 | Stop: 2023-04-26 | NDC 00904704461

## 2023-04-26 MED ADMIN — CHOLESTYRAMINE 4 G PO PACK: 4 g | ORAL | @ 12:00:00 | Stop: 2023-05-23 | NDC 27241013421

## 2023-04-26 NOTE — Other
Patient's Clinical Goal:   Clinical Goal(s) for the Shift: VSS, safety, pain mgmt, rest, trend anti-xa, heparin gtt, take pic of BM  Identify possible barriers to advancing the care plan: none  Stability of the patient: Moderately Stable - low risk of patient condition declining or worsening   Progression of Patient's Clinical Goal:     Code: FULL     Hx: anxiety, depression, emphysema, fibromyalgia, GERD, SMA stenosis      Problem/Goal: Acute on chronic abd pain, SMA stenosis, celiac stenosis, Poor PO intake, severe protein malnutrition    Response to Plan of Care and any changes in condition (clinical condition, any concerning assessments, events, interventions; response to POC effectiveness or changes made):     AAOx4. PO gabapentin and tylenol, PRN PO oxycodone and IV dilaudid for abd pain w/ mild relief.   RA. O2 > 93%. No SOB noted. Cont pulse ox  BP: 115/79  Pt was upset about checking BP prior to IV dilaudid. Education provided.   Heparin Gtt for SMA stenosis. 800 units/hr (53mL/hr) from 850 units/hr. No s/sx of bleeding. Anti-xa: 0.75  BMAT-4.  Last BM 5/4. Pending stool picture per MD order    Interdisciplinary communication (team member communication):   none  Psychosocial communication (family or patient issues potentially affecting care):   none  Plan and Disposition (progression toward specific goals of care/hospitalization and discharge):  Pain mgmt, IV heparin, trend anti-xa, stool picture  Dispo: Transfer to [facility] for Aortogram, visceral angiogram possibly 5/9.

## 2023-04-26 NOTE — Progress Notes
Pharmaceutical Services - Heparin Consult Management Note    Objective Data   Allergies: Hydrocodone, Ibuprofen, and Morphine  Height:   Most recent documented height   04/23/23 1.626 m (5' 4.02'')     Actual Weight:   Most recent documented weight   04/23/23 49 kg       Heparin drip administrations (last 48 hours)       Date/Time Action Medication Dose Rate    04/26/23 0654 Rate/Dose Change*    heparin 25,000 units in 0.45% NaCl 250 mL drip RTU 800 Units/hr 8 mL/hr    04/26/23 0220 New Bag/ Syringe/ Cartridge    heparin 25,000 units in 0.45% NaCl 250 mL drip RTU 850 Units/hr 8.5 mL/hr    04/24/23 2258 New Bag/ Syringe/ Cartridge    heparin 25,000 units in 0.45% NaCl 250 mL drip RTU 850 Units/hr 8.5 mL/hr            Current Labs  No results for input(s): ''APTT'' in the last 48 hours.  Recent Labs     04/26/23  0554   HEPANTIXAUFH 0.75     Recent Labs     04/26/23  0554   WBC 6.49   HGB 8.2*   HCT 27.4*   MCV 86.7   PLT 385       INR   Date Value Ref Range Status   04/23/2023 0.9 . Final     Comment:     Therapeutic Range: INR 2-3  Mechanical Valves: INR 2.5-3.5       INR (Outside Lab)   Date Value Ref Range Status   03/09/2021 0.90  Final     Bilirubin,Total   Date Value Ref Range Status   04/24/2023 0.2 0.1 - 1.2 mg/dL Final       Assessment:  Protocol: Regular Intensity: Anti-Xa goal 0.3-0.7 or aPTT goal 66-108  Anti-Xa aPTT Re-bolus if Ordered  (NTE 5,000 units) /  Hold Infusion Rate Change Check Anti-Xa/aPTT   <0.1 <46 50 units/kg Increase by 4 units/kg/hr 6 hours after rate change   0.1-0.19 46-55.9 25 units/kg Increase by 3 units/kg/hr    0.2-0.29 56-65.9 - Increase by 2 units/kg/hr    0.3-0.7 66-108 Continue current rate 6 hours;  If therapeutic x2, recheck qAM   0.71-0.8 108.1-118 - Decrease by 1 unit/kg/hr 6 hours after rate change   0.81-0.9 118.1-128 - Decrease by 2 units/kg/hr    0.91-1 128.1-140 Hold for 1 hour Decrease by 3 units/kg/hr    >1 >140 Hold infusion and repeat STAT anti-Xa/aPTT in 1 hour.  If anti-Xa ? 1 or aPTT ? 140, restart infusion and decrease by 4 units/kg/hr. Repeat anti-Xa/aPTT 6 hours after rate change.  If anti-Xa > 1 or aPTT > 140, continue to hold and repeat anti-Xa/aPTT in 1 hour. Notify provider.       Indication: arterial thrombosis-high grade stenosis   Goal: anti-Xa 0.3-0.7  Dosing weight: 49kg  Concurrent warfarin: No  Concomitant medications that may contribute to bleeding: duloxetine   Pending invasive procedures: TBD possible angioplasty/stent placement @ [facility]   Signs and symptoms of bleeding/bruising within 24 hours: No RN confirmed no s/sx of bleeding or neuro changes       Plan:  Donna Gross is a 57 y.o. female who has been referred to pharmacy for therapeutic heparin management.     Bolus: No bolus at this time  Infusion: Decrease to 800 units/hr    Next anti-Xa/aPTT ordered at 1300   Plan was communicated  to RN.  Pharmacy will continue to monitor the patient?s clinical progress and communicate any changes with treatment team.  For questions about this patient's heparin therapy, please call [phone] [facility]) or [phone] [facility]).    Tasia Catchings, PharmD, 04/26/2023, 6:57 AM  ________________________________________________________    Addendum:  Last  anti-Xa  at 13:40 was 0.37, which is Therapeutic.      Plan:   Heparin rate: Continue at 800 units/hr  Next anti-Xa ordered at 19:40.     Plan was communicated to RN. Pharmacy will continue to monitor the patient?s clinical progress.     For questions about this patient's heparin therapy, please call [phone] [facility]) or [phone] [facility]).    Donna Gross 04/26/2023 3:33 PM   ____________________________________________________    Addendum:  Last  anti-Xa  at 19:45 was 0.47, which is Therapeutic.      Plan:   Heparin rate: Continue at 800 units/hr  Next anti-Xa ordered at 04:00.     Plan was communicated to RN. Pharmacy will continue to monitor the patient?s clinical progress.     For questions about this patient's heparin therapy, please call [phone] [facility]) or [phone] [facility]).    Donna Gross [facility] 04/26/2023 8:40 PM

## 2023-04-26 NOTE — Progress Notes
VASCULAR SURGERY CONSULTATION - PROGRESS NOTE    PATIENT: Donna Gross  MRN: [id number]  DOB: April 17, 1966  DATE OF SERVICE: 04/26/2023     PRIMARY CARE PROVIDER: Kelle Darting, MD    Reason for Consultation: Abdominal Pain     Subjective:     History of Present Illness:  Donna Gross is a 57 y.o. female w/ PMHx pf anxiety, depression, emphysema, active smoker (1 cig per day), fibromyalgia, GERD, SMA stenosis who presents to the ED with an acute abdominal pain on a chronic abdominal pain.   The patient has a known SMA stenosis with chronic symptoms (~1 yr) of post-prandial emesis (1-2x per week) of undigested food, abdominal discomfort, and post-prandial diarrhea. Patient reports symptoms began 1 year ago ''after surgery''. 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 Most recent operation was lap chole in 03/2022 c/b subhepatic abscess requiring IR drainage and bile leak requiring ERCP/CBD stent.     She was recently admitted 04/18-04/22 for a similar presentation. During this hospitalization underwent CT ab/pelvis angiogram w/ and w/o contrast which demonstrated high grade stenosis of SMA.     IR and  consulted and did not feel strongly that this was the source of her abdominal pain and recommended against a stenting revascularization as the SMA narrowing is similar to prior studies. Given normal size and patency of the IMA and CA, IR was convinced the SMA narrowing is responsible for the patient's abdominal pain, and therefore stenting would not be beneficial.   The [place] abdo performed on 04/08/2023:   Patent celiac artery and superior mesenteric artery with borderline, approximately 50%, stenosis of the superior mesenteric artery at the origin.     She was started on anticoagulation with eliquis BID during the last hospitalization.    Patient came to the ED today as she reported having an acute diffuse abdominal pain today worse than her usual pain, Had 2 episodes of non-bloody emesis yesterday, and loose stools this morning.   Patients also reports that she is feeling that her belly is swollen.   Denies melena, hematochezia.   Also denies fevers, chills, CP, SOB, sick contacts, dysuria, hematuria.       04/24/2023: AFVSS, pt is still complaining of the same pain. Abdomen still soft and NT today. Is tolerating food. BM *2, passing gas.  04/25/2023: AFVSS. pt is still complaining of the same pain. Better PO intake 440/24H. Passing gas, BM+.  04/26/2023: AFVSS. Patient is stable regarding the pain. PO intake 290 cc. Passing gas, BM-.    Past Medical History:   Past Medical History:   Diagnosis Date    Anxiety     Depression     Emphysema, unspecified (HCC/RAF)     Emphysema, unspecified (HCC/RAF)     Fibromyalgia     Gastritis     GERD (gastroesophageal reflux disease)     History of blood transfusion     Intractable nausea and vomiting 01/23/2020    Pancreatitis        Past Surgical History:   Past Surgical History:   Procedure Laterality Date    ABDOMINAL SURGERY      COLON SURGERY         Scheduled Meds:  Current Facility-Administered Medications   Medication Dose Route Frequency    acetaminophen  tab 1,000 mg  1,000 mg Oral Q8H    albuterol 90 mcg/act inh 2 puff  2 puff Inhalation Q6H PRN    atorvastatin tab 40 mg  40 mg Oral QHS    bisacodyl supp 10 mg  10 mg Rectal Daily PRN    calcium carbonate chew tab 500 mg  500 mg Oral BID PRN    cholestyramine pwd packet 4 g  4 g Oral TID AC    DULoxetine DR cap 30 mg  30 mg Oral Daily    fluticasone-salmeterol 250-50 mcg/act diskus inhaler 1 puff  1 puff Inhalation BID    gabapentin cap 200 mg  200 mg Oral TID    heparin 25,000 units in 0.45% NaCl 250 mL drip RTU  800 Units/hr Intravenous Continuous    heparin per pharmacy   Does not apply Continuous [COMPLETED] HYDROmorphone 1 mg/mL inj 0.5 mg  0.5 mg IV Push Once PRN    [COMPLETED] HYDROmorphone 1 mg/mL inj 0.5 mg  0.5 mg IV Push Once PRN    HYDROmorphone 1 mg/mL inj 0.5 mg  0.5 mg IV Push Q6H PRN    hyoscyamine tab 125 mcg  125 mcg Oral Q4H PRN    ipratropium-albuterol 20-100 mcg/act inh 2 puff  2 puff Inhalation QID PRN    lactated ringers IV soln  90 mL/hr Intravenous Continuous    magnesium sulfate 2 g in water for injection 50 mL RTU  2 g Intravenous Once    naloxone 0.4 mg/mL inj 0.4 mg  0.4 mg IV Push PRN    nicotine 7 mg/24 hr patch 7 mg  7 mg Transdermal Daily    ondansetron tab 4 mg  4 mg Oral Q6H PRN    oxyCODONE tab 5 mg  5 mg Oral Q4H PRN    oxyCODONE tab 7.5 mg  7.5 mg Oral Q4H PRN    pancrelipase (Lip-Prot-Amyl) (Creon) DR cap 36,000 units of lipase  36,000 units of lipase Oral TID w/meals    pantoprazole DR tab 40 mg  40 mg Oral BID    polyethylene glycol pwd pkt 17 g  17 g Oral Daily    QUEtiapine tab 150 mg  150 mg Oral BID    ranolazine tab ER12 500 mg  500 mg Oral BID    senna tab 1 tablet  1 tablet Oral QHS PRN    sodium chloride 0.9% IV soln  10 mL/hr Intravenous PRN    sodium chloride 0.9% IV soln  10 mL/hr Intravenous PRN    thiamine tab 100 mg  100 mg Oral Daily    traZODone tab 50 mg  50 mg Oral QHS PRN    vitamin B complex cap 1 capsule  1 capsule Oral Daily    vitamin D (cholecalciferol) tab 50 mcg  50 mcg Oral Daily    [DISCONTINUED] gabapentin cap 100 mg  100 mg Oral TID    [DISCONTINUED] HYDROmorphone 1 mg/mL inj 0.5 mg  0.5 mg IV Push Q6H PRN    [DISCONTINUED] lactated ringers IV soln  75 mL/hr Intravenous Continuous       Allergies: Hydrocodone, Ibuprofen, and Morphine    Family History:   Family History   Problem Relation Age of Onset    Heart disease Mother     Diabetes Brother     Diabetes Father     Anesthesia problems Neg Hx     Malignant hypertension Neg Hx     Hypotension Neg Hx  Malignant hyperthermia Neg Hx     Pseudochol deficiency Neg Hx        Social History: Social History     Socioeconomic History    Marital status: Single   Tobacco Use    Smoking status: Every Day     Years: 20     Types: Cigarettes    Smokeless tobacco: Current    Tobacco comments:     1 cig per day   Substance and Sexual Activity    Alcohol use: No    Drug use: No   Social History Narrative    Lives w/ mom and daughter     Social Determinants of Health     Financial Resource Strain: Low Risk  (04/12/2023)    Financial Resource Strain     Difficulty of Paying Living Expenses: Not very hard       Objective:     Physical Exam:  Last Recorded Vital Signs:    04/26/23 0706   BP: 115/79   Pulse: 82   Resp:    Temp:    SpO2:      Body mass index is 18.53 kg/m?Marland Kitchen   Vitals:    04/23/23 1654   Weight: 49 kg (108 lb)   Height: 1.626 m (5' 4.02'')       Vitals:    04/23/23 1654   Weight: 49 kg (108 lb)   Height: 1.626 m (5' 4.02'')     General: The pateint is a well-appearing, well-nourished, in no acute distress.The patient is awake, alert, oriented, and answers questions appropriately.   Head: Normocephalic, atraumatic.   Eyes: Extraocular movements are intact. There is no scleral icterus.   Heart: SR  Chest:  No use of accessory muscle  Abdomen: Soft and non-tender.  Extremities:  No clubbing, cyanosis, or edema.    Laboratory Review:  Lab Results   Component Value Date    WBC 6.49 04/26/2023    HGB 8.2 (L) 04/26/2023    HCT 27.4 (L) 04/26/2023    MCV 86.7 04/26/2023    PLT 385 04/26/2023     Recent Results (from the past 24 hour(s))   Magnesium    Collection Time: 04/26/23  5:54 AM   Result Value Ref Range    Magnesium 1.4 1.4 - 1.9 mEq/L   Basic Metabolic Panel    Collection Time: 04/26/23  5:54 AM   Result Value Ref Range    Sodium 139 135 - 146 mmol/L    Potassium 4.1 3.6 - 5.3 mmol/L    Chloride 106 96 - 106 mmol/L    Total CO2 25 20 - 30 mmol/L    Anion Gap 8 8 - 19 mmol/L    Glucose 104 (H) 65 - 99 mg/dL    Creatinine 5.62 1.30 - 1.30 mg/dL    Estimated GFR 62 See GFR Additional Information mL/min/1.82m2    GFR Additional Information See Comment     Urea Nitrogen 10 7 - 22 mg/dL    Calcium 8.8 8.6 - 86.5 mg/dL   Phosphorus    Collection Time: 04/26/23  5:54 AM   Result Value Ref Range    Phosphorus 4.0 2.3 - 4.4 mg/dL   Unfractionated Heparin, by anti-Xa    Collection Time: 04/26/23  5:54 AM   Result Value Ref Range    Anti-Xa, UFH 0.75   IU/mL   CBC    Collection Time: 04/26/23  5:54 AM   Result Value Ref Range    White Blood  Cell Count 6.49 4.16 - 9.95 x10E3/uL    Red Blood Cell Count 3.16 (L) 3.96 - 5.09 x10E6/uL    Hemoglobin 8.2 (L) 11.6 - 15.2 g/dL    Hematocrit 24.4 (L) 34.9 - 45.2 %    Mean Corpuscular Volume 86.7 79.3 - 98.6 fL    Mean Corpuscular Hemoglobin 25.9 (L) 26.4 - 33.4 pg    MCH Concentration 29.9 (L) 31.5 - 35.5 g/dL    Red Cell Distribution Width-SD 51.3 (H) 36.9 - 48.3 fL    Red Cell Distribution Width-CV 16.2 (H) 11.1 - 15.5 %    Platelet Count, Auto 385 143 - 398 x10E3/uL    Mean Platelet Volume 8.8 (L) 9.3 - 13.0 fL    Nucleated RBC%, automated 0.0 No Ref. Range %    Absolute Nucleated RBC Count 0.00 0.00 - 0.00 x10E3/uL    Neutrophil Abs (Prelim) 3.23 See Absolute Neut Ct. x10E3/uL   Differential, Automated    Collection Time: 04/26/23  5:54 AM   Result Value Ref Range    Neutrophil Percent, Auto 49.8 No Ref. Range %    Lymphocyte Percent, Auto 39.9 No Ref. Range %    Monocyte Percent, Auto 6.9 No Ref. Range %    Eosinophil Percent, Auto 2.6 No Ref. Range %    Basophil Percent, Auto 0.5 No Ref. Range %    Immature Granulocytes% 0.3 No Reference Range %    Absolute Neut Count 3.23 1.80 - 6.90 x10E3/uL    Absolute Lymphocyte Count 2.59 1.30 - 3.40 x10E3/uL    Absolute Mono Count 0.45 0.20 - 0.80 x10E3/uL    Absolute Eos Count 0.17 0.00 - 0.50 x10E3/uL    Absolute Baso Count 0.03 0.00 - 0.10 x10E3/uL    Absolute Immature Gran Count 0.02 0.00 - 0.04 x10E3/uL       Imaging Review:   Repeat CT scan after the first one 04/23/2023, at 05:30PM to r/o a colonic bleeding  IMPRESSION:  No active bleed. Near interval resolution of the previously visualized small volume contrast in the terminal ileum and cecum, which may have been transient hemorrhage due to right sided colitis given proximal colonic wall thickening and mucosal   hyperenhancement and/or mild angiodysplasia given apparent serpiginous appearance of the terminal branch of the ileocolic artery. No bowel ischemia.   Signed by: Verdell Carmine   04/23/2023 5:53 PM    CT abd+pelvis angiogram wo+w contrast    Result Date: 04/23/2023  IMPRESSION: No active bleed. Near interval resolution of the previously visualized small volume contrast in the terminal ileum and cecum, which may have been transient hemorrhage due to right sided colitis given proximal colonic wall thickening and mucosal hyperenhancement and/or mild angiodysplasia given apparent serpiginous appearance of the terminal branch of the ileocolic artery. No bowel ischemia.  Signed by: Verdell Carmine   04/23/2023 5:53 PM    CT abd+pelvis angiogram wo+w contrast    Result Date: 04/23/2023  IMPRESSION: 1. 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 Arteries beyond their origin are patent. 2. Circumferential wall thickening in the ascending and proximal transverse colon, suspicious for colitis which may be ischemic or infectious in etiology. 3. High density foci at the inferior cecum and terminal ileal loops, seen on venous phase imaging and not included in the field-of-view on the noncontrast and arterial phase scans, suspicious for extravasation at the cecum with retrograde flow into distal ileal loops versus  extravasation of the distal ileal loops with some antegrade flow. Consider follow-up CT in 6 to 8 hours and/or IR consultation. These results were discussed with [facility]. LANDEFELD at 04/23/2023 12:51 PM. Signed by: Albin Fischer   04/23/2023 12:54 PM    US duplex abd limited portable    Result Date: 04/08/2023  IMPRESSION: Patent celiac artery and superior mesenteric artery with borderline, approximately 50%, stenosis of the superior mesenteric artery at the origin. I, Verdell Carmine, M.D., have reviewed this radiological study personally and I am in full agreement with the findings of the report presented here. Signed by: Verdell Carmine   04/08/2023 11:14 PM    CT abd+pelvis angiogram wo+w contrast    Result Date: 04/08/2023  IMPRESSION: High-grade stenosis of the origin of superior mesenteric artery. Clinically correlate for mesenteric angina. No direct CT findings of ischemic enteritis or colitis. Signed by: Carie Caddy   04/08/2023 10:14 AM   Diagnosis:   1. SMA stenosis     Assessment/Plan:     Donna Gross is a 57 y.o. female with known SMA stenosis and symptoms consistent with chronic mesenteric ischemia presenting with acute abdominal pain on a chronic abdominal pain.   Unclear whether this is acute vs. Chronic and whether her symptoms are primarily of vascular etiology.     - No elements in favor of a bowel ischemia (vitals normal, physical exam not in favor, WBC 6.87, Lactate 10). 2nd CT scan not in favor of a bowel ischemia. Not in favor of a bleeding.     All the patient's questions that arose during the visit were answered to the apparent satisfaction of the patient.    Plan:   - serial abdominal exam  - we recommend an angiogram: will be coordinate with medicine team for the timing and location.   - Rest of care per primary team.    Author: Isaiah Serge, MD 04/26/2023 9:07 AM  General Surgery  [facility]    Patient seen and examined by and plan developed with the attending physician, Dr. Keene Breath.    I spent  30-45  minutes face-to-face with the patient, care coordination, reviewing imaging, pertinent lab findings, and medical decision making. Over half in the discussion of the diagnosis and the discussed risks and benefits of surgery and alternative options.

## 2023-04-26 NOTE — Consults
IP CM ACTIVE DISCHARGE PLANNING  Department of Care Coordination      Admit ZDGU:[id number]  Anticipated Date of Discharge: 04/28/2023    Following QQ:VZDG, Synetta Fail, MD      Today's short update     Per discussion with medical team pt is pending vascular surgery team recs at this time. Discharge home with no needs, once medically stable.    Disposition     Home, No needs identified  [address] [place] [address]  Family/Support System in agreement with the current discharge plan: Yes, in agreement and participating    Freedom of Choice     Treatment Preferences: Addressed  Discharge Goals: Addressed                   Emerson Monte,  04/26/2023

## 2023-04-26 NOTE — Consults
PATIENT:  Donna Gross  MRN:  [id number]  DOB:  1966-09-19  DATE OF SERVICE:  04/26/2023  .  ATTENDING PHYSICIAN: Debbe Mounts, MD   PRIMARY CARE PROVIDER: Kelle Darting, MD    Chief complaint:   Chief Complaint   Patient presents with    Abdominal Pain     Pain isnt getting any better I was admitted on the 4/18. I have a lot of pain when I eat. I also feel swollen.        BACKGROUND PAIN HISTORY:    H&P per Tami Lin on 04/23/23:  ''Donna Gross is a 57 y.o. female w/ PMHx pf anxiety, depression, emphysema, fibromyalgia, GERD, SMA stenosis who presents with intractable abdominal pain.     Patient reports that she has been feeling the same diffuse abdominal pain (acute on chronic), nausea/vomiting, and abdominal swelling that she presented with on  04/18 and has several times previous.      Pt reports she does not understand why they discharged her as none of her symptoms had improved Continues with persistent abdominal pain that is always present and will worsen. Notes some triggers include eating and Bms. Continues with nausea and nonbloody/nonbilious emesis, especially postprandially. Also continues with ''the runs.'' Reports loose stools with all bowel movements. States number of episodes varies on PO intake. Denies melena, hematochezia. Today states nothing she does at home helps her pain. The dilaudid she received has helped. Indicates she came to the hospital today because she has lost 4 more pounds since last discharge on 04/22. In total believes     Also denies fevers, chills, CP, SOB, sick contacts, dysuria, hematuria.      Recently admitted 04/18-04/22 for a similar presentation. During this hospitalization underwent CT ab/pelvis angiogram w/ and w/o contrast which demonstrated high grade stenosis of SMA. Vascular and IR consulted who did not feel strongly that this was the source of her abdominal pain and recommended against surgical interventions. She was started on anticoagulation with eliquis BID.''    We are consulted by the primary team (attending: Dr. Debbe Mounts, MD) for an opinion regarding the management of this patient's acute-on-chronic abdominal pain.    CURRENT PAIN PRESENTATION (last 24 hours):    Patient elicits that she has 10/10 abdominal pain. Eating is okay, she was able to eat most of her breakfast including pancakes and sausage. She states that the dilaudid IV works but Science writer off before her next dose is due 6 hours later.     CURRENT PAIN REGIMEN (inpatient):    APAP 1000mg  q8h  Duloxetine 40mg  qd  Gabapentin 200mg  tid  Dilaudid 0.5mg  IVP q6h prn  Oxycodone 5/7.5mg  q4h prn    Home Regimen:    Duloxetine 30mg  qd  Percocet 7.5-325 qid    CURES report (last checked on 04/26/2023 ):      _________________________________    Past Medical History:   Diagnosis Date    Anxiety     Depression     Emphysema, unspecified (HCC/RAF)     Emphysema, unspecified (HCC/RAF)     Fibromyalgia     Gastritis     GERD (gastroesophageal reflux disease)     History of blood transfusion     Intractable nausea and vomiting 01/23/2020    Pancreatitis         Past Surgical History:   Procedure Laterality Date    ABDOMINAL SURGERY      COLON SURGERY  Family History   Problem Relation Age of Onset    Heart disease Mother     Diabetes Brother     Diabetes Father     Anesthesia problems Neg Hx     Malignant hypertension Neg Hx     Hypotension Neg Hx     Malignant hyperthermia Neg Hx     Pseudochol deficiency Neg Hx        Social History     Socioeconomic History    Marital status: Single   Tobacco Use    Smoking status: Every Day     Years: 20     Types: Cigarettes    Smokeless tobacco: Current    Tobacco comments:     1 cig per day   Substance and Sexual Activity    Alcohol use: No    Drug use: No   Social History Narrative    Lives w/ mom and daughter     Social Determinants of Health     Financial Resource Strain: Low Risk  (04/12/2023)    Financial Resource Strain     Difficulty of Paying Living Expenses: Not very hard        Current Facility-Administered Medications   Medication Dose Route Frequency    acetaminophen tab 1,000 mg  1,000 mg Oral Q8H    albuterol 90 mcg/act inh 2 puff  2 puff Inhalation Q6H PRN    atorvastatin tab 40 mg  40 mg Oral QHS    bisacodyl supp 10 mg  10 mg Rectal Daily PRN    calcium carbonate chew tab 500 mg  500 mg Oral BID PRN    cholestyramine pwd packet 4 g  4 g Oral TID AC    [START ON 04/27/2023] DULoxetine DR cap 40 mg  40 mg Oral Daily    fluticasone-salmeterol 250-50 mcg/act diskus inhaler 1 puff  1 puff Inhalation BID    gabapentin cap 200 mg  200 mg Oral TID    heparin 25,000 units in 0.45% NaCl 250 mL drip RTU  800 Units/hr Intravenous Continuous    heparin per pharmacy   Does not apply Continuous    [COMPLETED] HYDROmorphone 1 mg/mL inj 0.5 mg  0.5 mg IV Push Once    [COMPLETED] HYDROmorphone 1 mg/mL inj 0.5 mg  0.5 mg IV Push Once PRN    [COMPLETED] HYDROmorphone 1 mg/mL inj 0.5 mg  0.5 mg IV Push Once PRN    [COMPLETED] HYDROmorphone 1 mg/mL inj 0.5 mg  0.5 mg IV Push Once PRN    [COMPLETED] HYDROmorphone 1 mg/mL inj 0.5 mg  0.5 mg IV Push Once PRN    HYDROmorphone 1 mg/mL inj 0.5 mg  0.5 mg IV Push Q6H PRN    [COMPLETED] HYDROmorphone 1 mg/mL inj 1 mg  1 mg IV Push STAT    [COMPLETED] HYDROmorphone 1 mg/mL inj 1 mg  1 mg IV Push STAT    [COMPLETED] HYDROmorphone 1 mg/mL inj 1 mg  1 mg IV Push STAT    hyoscyamine tab 125 mcg  125 mcg Oral Q4H PRN    [COMPLETED] iohexol (Omnipaque) 350 mg/mL inj 100 mL  100 mL Intravenous Once    [COMPLETED] iohexol (Omnipaque) 350 mg/mL inj 100 mL  100 mL Intravenous Once    ipratropium-albuterol 20-100 mcg/act inh 2 puff  2 puff Inhalation QID PRN    [COMPLETED] lactated ringers IV soln bolus 1,000 mL  1,000 mL Intravenous Once    [COMPLETED] lactated ringers IV soln bolus  1,000 mL  1,000 mL Intravenous Once    [COMPLETED] lactated ringers IV soln bolus 1,000 mL  1,000 mL Intravenous Once    [COMPLETED] lactated ringers IV soln bolus 1,000 mL 1,000 mL Intravenous Once    lactated ringers IV soln  45 mL/hr Intravenous Continuous    [COMPLETED] magnesium sulfate 2 g in water for injection 50 mL RTU  2 g Intravenous Once    [COMPLETED] magnesium sulfate 2 g in water for injection 50 mL RTU  2 g Intravenous Once    [COMPLETED] magnesium sulfate 4 g in water for injection 100 mL RTU  4 g Intravenous Once    naloxone 0.4 mg/mL inj 0.4 mg  0.4 mg IV Push PRN    nicotine 7 mg/24 hr patch 7 mg  7 mg Transdermal Daily    [COMPLETED] ondansetron 4 mg/2 mL inj 4 mg  4 mg Intravenous Once    ondansetron tab 4 mg  4 mg Oral Q6H PRN    oxyCODONE tab 5 mg  5 mg Oral Q4H PRN    oxyCODONE tab 7.5 mg  7.5 mg Oral Q4H PRN    pancrelipase (Lip-Prot-Amyl) (Creon) DR cap 36,000 units of lipase  36,000 units of lipase Oral TID w/meals    pantoprazole DR tab 40 mg  40 mg Oral BID    polyethylene glycol pwd pkt 17 g  17 g Oral Daily    QUEtiapine tab 150 mg  150 mg Oral BID    ranolazine tab ER12 500 mg  500 mg Oral BID    senna tab 1 tablet  1 tablet Oral QHS PRN    [COMPLETED] sodium chloride 0.9% IV soln bolus 1,000 mL  1,000 mL Intravenous Once    sodium chloride 0.9% IV soln  10 mL/hr Intravenous PRN    sodium chloride 0.9% IV soln  10 mL/hr Intravenous PRN    thiamine tab 100 mg  100 mg Oral Daily    traZODone tab 50 mg  50 mg Oral QHS PRN    vitamin B complex cap 1 capsule  1 capsule Oral Daily    vitamin D (cholecalciferol) tab 50 mcg  50 mcg Oral Daily    [DISCONTINUED] cyanocobalamin tab 1,000 mcg  1,000 mcg Oral Daily    [DISCONTINUED] DULoxetine DR cap 30 mg  30 mg Oral Every Other Day    [DISCONTINUED] DULoxetine DR cap 30 mg  30 mg Oral Daily    [DISCONTINUED] gabapentin cap 100 mg  100 mg Oral QHS    [DISCONTINUED] gabapentin cap 100 mg  100 mg Oral TID    [DISCONTINUED] HYDROmorphone 1 mg/mL inj 0.5 mg  0.5 mg IV Push Once    [DISCONTINUED] HYDROmorphone 1 mg/mL inj 0.5 mg  0.5 mg IV Push Q6H PRN    [DISCONTINUED] HYDROmorphone 1 mg/mL inj 1 mg  1 mg IV Push Q4H PRN    [DISCONTINUED] lactated ringers IV soln bolus 1,000 mL  1,000 mL Intravenous Once    [DISCONTINUED] lactated ringers IV soln  90 mL/hr Intravenous Continuous    [DISCONTINUED] lactated ringers IV soln  75 mL/hr Intravenous Continuous    [DISCONTINUED] oxyCODONE tab 2.5 mg  2.5 mg Oral Q4H PRN    [DISCONTINUED] oxyCODONE tab 5 mg  5 mg Oral Q4H PRN    [DISCONTINUED] pantoprazole DR tab 40 mg  40 mg Oral Daily       Allergies    Hydrocodone, Ibuprofen, and Morphine      Review of  Systems:    Patient positives noted below as checked:    General ROS []  Fatigue []  Chills []  Fever []  Other:       Psychological ROS []  Anxiety []  Depression []  Insomnia []  Other:      Ophthalmic ROS []  Blurry vision []  Decreased vision []  Double vision []  Other:      ENT ROS []  Vertigo []  Vision changes [] Vocal changes []  Other:      Allergy and Immunology ROS []  Nasal congestion []  Nasal discharge []  Postnasal drip []  Other:      Hematological/Lymphatic ROS []  Bleeding problems []  Blood clots []  Bruising []  Other:      Endocrine ROS []  Mood swings []  Palpitations [] Polydipsia/polyuria []  Other:      Breast ROS []  Lumps []  Discharge []  Erythema []  Other:      Respiratory ROS []  Cough []  Shortness of breath []  Wheezing []  Other:      Cardiovascular ROS []  Chest pain []  Palpitations []  Dyspnea on exertion []  Other:      Gastrointestinal ROS [x]  Abdominal pain []  Change in bowel habits []  Black or bloody stools []  Other:      Genito-Urinary ROS []  Dysuria []  Trouble voiding []  Hematuria  []  Other:      Musculoskeletal ROS []  Joint erythema or swelling; pain in []  Back []  Neck []  Muscles []  Joints []  Other:      Neurological ROS []  Weakness []  Dizziness []  Fainting []  Seizures []  Tremors []  TIA/stroke symptoms []  Other:      Dermatological ROS []  Hair changes []  Nail changes []  Pruritus []  Other:           Objective:     Vitals: BP 94/71 (BP Location: Left arm, Patient Position: Sitting)  ~ Pulse 71  ~ Temp 36.6 ?C (97.9 ?F) (Oral)  ~ Resp 17  ~ Ht 5' 4.02'' (1.626 m)  ~ Wt 108 lb (49 kg)  ~ SpO2 96%  ~ BMI 18.53 kg/m?  Body mass index is 18.53 kg/m?Marland Kitchen       Constitutional: alert and  not in acute distress, eating breakfast appears comfortable  Mental Status: Alert and oriented to person, place, and time.  Recent and remote memory are intact.   Eyes: Orbits, eyelids, conjunctivae and sclera are normal in appearance. Pupils are equal, round, reactive to light with extraocular movements intact.  ENT: mucous membranes moist, oral and nasal cavities without lesions  Cardiac: appears well perfused  Chest: non-labored  Abdomen: soft, tender, nondistended  Lymph nodes: no enlarged lymph nodes  Skin: Inspection of the head and neck, trunk and extremities is normal.  Extremity: Examination of the bilateral elbows, wrists, knees and ankles reveals no crepitus or swelling. Joints are stable and range of motion is normal.  Neurologic: Cranial nerves II-XII grossly intact.     Labs:     Lab Results   Component Value Date    WBC 6.49 04/26/2023    HGB 8.2 (L) 04/26/2023    HCT 27.4 (L) 04/26/2023    MCV 86.7 04/26/2023    PLT 385 04/26/2023       Lab Results   Component Value Date    CREAT 1.05 04/26/2023    BUN 10 04/26/2023    NA 139 04/26/2023    K 4.1 04/26/2023    CL 106 04/26/2023    CO2 25 04/26/2023           Imaging:     All available imaging and studies  were reviewed.    Assessment & Plan:     Donna Gross is a 57 y.o. female with PMHx pf anxiety, depression, emphysema, fibromyalgia, GERD, SMA stenosis who presents with intractable abdominal pain. Likely component of pain is from SMA stenosis. Has been hospitalized many times for similar presentations with negative work-up aside from SMA stenosis. Admitted pending Vascular surgery management to address SMA syndrome. Consulted for intractable abdominal pain.     Recommendations:  Recommend continuing below multimodal pain regimen:  APAP 1000mg  q8h  Oxycodone 5/7.5mg  q4h prn  - Recommend increasing Duloxetine 40mg  every day to 60mg  qd  - Recommend increasing Gabapentin 200mg  tid to 300mg  tid  - Recommend increasing Dilaudid IVP 0.5mg  q6h prn to q4h  - Recommend holding parameters for opioids: RR<12 or sedation.  - Please contact Chronic Pain pager [phone] for questions.  Our recommendations for plan of care will be/were reviewed with the primary team.  This patient was seen and discussed with attending Dr. Raynald Kemp.  Author:  Rico Ala. [place], DO 04/26/2023 4:03 PM primary team.  This patient was seen and discussed with attending Dr. Marland Kitchen  Author:  Rico Ala. Gardiner Coins, DO 04/26/2023 4:03 PM

## 2023-04-26 NOTE — Progress Notes
Family Medicine Progress Note    Primary Care Provider: Kelle Darting, MD  Attending Physician: Donna Gross., MD  Senior Resident Physician: Donna Stains, MD  Junior Resident Physician: Donna Blanks, MD    Chief complaint: Abdominal pain    Subjective:     Overnight events:   - NAEON    This AM:  - Patient is frustrated about the timing of oxycodone and need to constantly check blood pressure   - Reports that her abdominal pain is unchanged from previous  - Denies fevers, chills, vomiting, constipation, SOB, chest pain    PRNs: Oxycodone 7.5mg  x3, Dilaudid 0.5 mg IV x2      Medications:     Continuous Medications:    heparin 25,000 units/250 mL drip 800 Units/hr (04/26/23 0654)    lactated ringers 90 mL/hr (04/25/23 2003)     Scheduled Medications:     acetaminophen  1,000 mg Oral Q8H    atorvastatin  40 mg Oral QHS    cholestyramine  4 g Oral TID AC    DULoxetine  30 mg Oral Daily    fluticasone-salmeterol  1 puff Inhalation BID    gabapentin  200 mg Oral TID    nicotine patch  7 mg Transdermal Daily    pancrelipase (Lip-Prot-Amyl)  36,000 units of lipase Oral TID w/meals    pantoprazole  40 mg Oral BID    QUEtiapine  150 mg Oral BID    ranolazine  500 mg Oral BID    thiamine  100 mg Oral Daily    vitamin B complex  1 capsule Oral Daily    vitamin D (cholecalciferol)  50 mcg Oral Daily     PRN Medications: albuterol, calcium carbonate, HYDROmorphone, hyoscyamine, ipratropium-albuterol, naloxone, ondansetron, oxyCODONE, oxyCODONE, sodium chloride, traZODone      Physical Exam:     Vital signs in last 24 hours:   Temp:  [36.5 ?C (97.7 ?F)-36.7 ?C (98 ?F)] 36.6 ?C (97.8 ?F)  Heart Rate:  [67-84] 73  Resp:  [16-18] 18  BP: (90-115)/(61-76) 106/67  NBP Mean:  [69-88] 80  SpO2:  [93 %-98 %] 97 %     I/O last 2 completed shifts:  In: 2472.8 [P.O.:290; I.V.:2182.8]  Out: -   Bms: none charted in last 24 hours    General: Alert, comfortable, NAD  Head: Atraumatic, normocephalic  Eyes: PERRL. EOM intact. Sclera are anincteric and noninjected.  Mouth/Throat: Mucous membranes moist  Cardiac: RRR, nl S1/S2, no m/r/g, no thrills on palpation.   Lungs: Respiratory effort nl, CTAB, no crackles or wheezes  Abdomen: Normoactive bowel sounds. No masses or organomegaly appreciated though exam limited by pain. Voluntary guarding with palpation, no rebound. Diffusely TTP. Multiple abdominal surgery scars.   Extr: No lower extremity edema, warm and well-perfused.  Skin: Warm and dry. No significant rashes or lesions noted.  Neuro: A&O x4/4.   Psych: Normal mood and affect      Laboratory Data:     Recent Labs     04/26/23  0554   WBC 6.49   HGB 8.2*   HCT 27.4*   MCV 86.7   PLT 385   NEUTPCT 49.8   NA 139   K 4.1   CL 106   CO2 25   BUN 10   CREAT 1.05   MG 1.4   CALCIUM 8.8       Recent Labs     04/24/23  0302   ALT 7*  AST 18   BILITOT 0.2   ALKPHOS 67   ALBUMIN 3.8*       No results for input(s): ''TSH'', ''HGBA1C'' in the last 72 hours.  No results for input(s): ''CHOL'', ''CHOLHDL'', ''CHOLDLCAL'', ''CHOLDLQ'', ''TRIGLY'' in the last 72 hours.  No results for input(s): ''TROPONIN'', ''BNP'' in the last 72 hours.  Recent Labs     04/23/23  0856   PT 12.6   INR 0.9   APTT 31.1       No results for input(s): ''BACULBLD'', ''BACULUR'', ''BACULRSP'' in the last 72 hours.  No results for input(s): ''GLUCOSEPOC'' in the last 72 hours.    Urinalysis:  Invalid input(s): ''URINEPH'', ''URINEWBC'', ''UAWBC''    Studies:     Imaging:   CT abd+pelvis angiogram wo+w contrast   Final Result by Donna Gross., MD (05/03 1753)   IMPRESSION:      No active bleed. Near interval resolution of the previously visualized small volume contrast in the terminal ileum and cecum, which may have been transient hemorrhage due to right sided colitis given proximal colonic wall thickening and mucosal    hyperenhancement and/or mild angiodysplasia given apparent serpiginous appearance of the terminal branch of the ileocolic artery. No bowel ischemia.                 Signed by: Donna Gross   04/23/2023 5:53 PM      CT abd+pelvis angiogram wo+w contrast   Final Result by Donna Fischer, MD (05/03 1254)   IMPRESSION:         1. 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 Arteries beyond their origin are patent.   2. Circumferential wall thickening in the ascending and proximal transverse colon, suspicious for colitis which may be ischemic or infectious in etiology.   3. High density foci at the inferior cecum and terminal ileal loops, seen on venous phase imaging and not included in the field-of-view on the noncontrast and arterial phase scans, suspicious for extravasation at the cecum with retrograde flow into    distal ileal loops versus extravasation of the distal ileal loops with some antegrade flow. Consider follow-up CT in 6 to 8 hours and/or IR consultation.      These results were discussed with Donna Gross. LANDEFELD at 04/23/2023 12:51 PM.               Signed by: Donna Gross   04/23/2023 12:54 PM      Cath Lab Peripheral Diagnostic and Interventional    (Results Pending)       Assessment and Plan:     Donna Gross is a 57 y.o. female with PMHx pf anxiety, depression, emphysema, fibromyalgia, GERD, SMA stenosis who presents with intractable abdominal pain. Likely component of pain is from SMA stenosis. Has been hospitalized many times for similar presentations with negative work-up aside from SMA stenosis. Admitted pending Vascular surgery management to address SMA syndrome.     #Acute on chronic abdominal pain, POA  #SMA stenosis, chronic, POA  #Celiac stenosis, POA  #CT evidence of CAD, POA  Pt been suffering with abdominal pain for approx. 1 year. Has been admitted several times previously with similar complaints. Recently admitted for similar presentation but CT imaging now with evidence of CT extravasation in cecum and ileum c/f possible worsening ischemia. In addition SMA further stenosis and now mild celiac stenosis in addition. Previously evaluated by vascular  surgery and IR for interventions but those teams not favorable to SMA as culprit of pain given negative lactate. New CT findings raise greater c/f SMA occlusion complications. Reassuringly, short interval repeat CT w/o evidence of further ischemia or bleeding. While lactate remains negative, query intermittent elevation not appreciated on our lab collections. Patients symptoms fit with SMA syndrome- worsening postprandial symptoms, food aversion leading to acute refusal, and abdominal pain. Concern for ischemic bowel given increase in lactate on 5/4, resolved on recheck.  -Vascular surgery consult, appreciate recs  -Pain management:  - Tylenol ATC  - Gabapentin 200mg  TID  - Oxycodone sliding scale 5/7.5 q4h PRN  - Dilaudid BT pain 0.5mg  q6h PRN-- reserving for breakthrough pain given softer BP with evidence of drop after IV meds  -pain management consult, appreciate recs  -pantoprazole 40 mg BID  -calcium carbonate PRN  -continue heparin drip, consider transition back to home eliquis if no procedure imminent  -continue atrovastatin 40 mg at bedtime  -consider colonoscopy     #Diarrhea, chronic, POA  #Nausea/Vomiting, chronic, POA  Pt with chronic nausea/vomiting/diarrhea over last year. So far work up has included negative lab studies: c.diff, celiac panel, h.pylori, parasitic enteric panel. Pt unable to tolerate colonoscopy and negative EGD. Given CT findings of colitis could query infectious etiology but lack of fever, leukocytosis, and sick contacts with fairly recent workup while with current symptoms negative point against. Query poorly controlled GERD. Also consider pain contributing n/v.  -continue home hyoscyamine 125 mcg q4hrs PRN  -zofran 4mg  q6hrs  -Consider stool studies if infectious signs arise (fever, leukocytosis)     #Poor PO intake, chronic, POA  #Severe protein malnutrition, POA  Previously evaluated by nutrition who had recommended that pt receive boost TID for assistance with malnutrition likely 2/2 poor PO intake from abdominal pain. Reports weight loss of approx 50lbs in the past year. Poor appetite 2/2 pain.   -Regular diet  -LR 45cc/hr  -nutrition consult, appreciate recs  -nutrition supplements TID  -thiamine, vitamin D, vitamin B supplementation    #Normocytic anemia, Chronic POA  Pt presents with hgb of 9.8 c/w her baseline of 9-10. Query anemia 2/2 to nutritional deficincies from poor PO intake. From chart review, no recent vitamin or iron studies. Will order to further characterize. Denies SS of bleeding. Folate/B12 WNL. Iron studies c/f for element of IDA. Given current pain and not wanting to mask diarrhea, will hold on iron supplementation for now but consider when more clinically stable  -daily cbc  -consider iron supplementation as outpatient     #Tobacco use, POA  Previous PPD smoker from age 49 and reduced over last 7 years to now 1 cigarette all day. Requests nicotine patch while IP.   -nicotine patch  -counseled on nicotine cessation     #Anxiety, chronic, POA  #Major Depression, chronic, POA  #Fibromyalgia, chronic, POA   #Insomnia, chronic, POA  Chronic conditions. Will continue home medications.   - Continue Seroquel 150mg  BID  - Increase duloxetine to 40 mg qday   - Trazodone 50 mg at bedtime PRN    #FEN/GI:   - Regular diet    #Access: pIV    #Prophylaxis:  - heparin for therapeutic anticoag in place of home eliquis    #Code: Full Code    Stark Klein, MD who agrees with the above assessment and plan unless otherwise noted in addendum.    Author:  Burnell Blanks, MD 04/26/2023 7:02 AM     FM  Attending Addendum:    I have seen and examined the patient, and discussed the plan of care with the resident. I am in agreement with the findings, assessments and treatment plans as stated in this document. Today patient endorses same diffuse abdominal pain, worse after eating, moderately controlled with current pain regimen. Endorsing passing flatus, last BM two days ago, with mild increase in abdominal distention. Patient was examined by me 30 minutes after receiving a dose of dilaudid IV, normoactive bowel sounds, with diffuse TTP to deep palpation, no guarding or rebound. Patient was also able to sit up from a lying position by herself without noted discomfort. Given clinical stability, no concern for acute bowel ischemia, pain and PO intolerance likely due to her chronic superior mesenteric stenosis. Appreciate Vascular Surgery recommendations if interventions would be possible (i.e. stent placement) to relieve pain. Will also start bowel regimen today as patient's prior diarrhea has resolved and she is receiving large amounts of opiates for pain control to avoid constipation which would worsen her pain, consider XR KUB to evaluate for ileus if patient still does not have a bowel movement by tomorrow. Dispo pending patient tolerating PO and pain controlled with PO pain regimen.     Debbe Mounts, MD

## 2023-04-26 NOTE — Other
Patient's Clinical Goal:   Clinical Goal(s) for the Shift: pain mgmt, monitor labs, comfort and safety  Identify possible barriers to advancing the care plan: none  Stability of the patient: Moderately Unstable - medium risk of patient condition declining or worsening    Progression of Patient's Clinical Goal:     Problem/Goal:   Acute on chronic abd pain, SMA stenosis, celiac stenosis, Poor PO intake, severe protein malnutrition   Response to Plan of Care and any changes in condition (clinical condition, any concerning assessments, events, interventions; response to POC effectiveness or changes made):     A/O x 4, calm and cooperative    Pain 9/10, complained of abd pain, PRN PO Oxy and IV BT Dilaudid given     Cardiac monitor, NSR. BP and HR are WNL    Cont pulse ox, O2 Sat 96% RA    Voids in toilet, yellow and clear.     Regular diet. Last BM 04/24/23. No BM during shift, Scheduled Miralax given x 1 dose. NPO after MN for possible procedure 04/27/23    BMAT 4, ambulates independently. Call light within reach. Refuses bed alarm    Skin intact    IV access: LAC 20g, infusing LR @ 45 ml/hr and RFA infusing Heparin drip @ 800 units/hr, no s/s of bleeding. Anti-Xa: 0.37     Mag +1.4, mag sulfate 2g given     Last Recorded Vital Signs:    04/26/23 1511   BP: 94/71   Pulse: 71   Resp: 17   Temp:    SpO2: 96%     Interdisciplinary communication (team member communication):   Case manager  Surgery    Psychosocial communication (family or patient issues potentially affecting care):   none    Plan and Disposition (progression toward specific goals of care/hospitalization and discharge):  Cont cardiac monitor  CTM labs  Cont pain control  Cont PRN bowel regimen  NPO after MN for possible procedure 04/27/23  DC dispo: pending

## 2023-04-27 ENCOUNTER — Ambulatory Visit: Payer: PRIVATE HEALTH INSURANCE

## 2023-04-27 LAB — Basic Metabolic Panel
ANION GAP: 8 mmol/L (ref 8–19)
POTASSIUM: 4.5 mmol/L (ref 3.6–5.3)

## 2023-04-27 LAB — Vitamin B1 (Thiamine),Wh Blood: VITAMIN B1 (THIAMINE),WH BLOOD: 117 nmol/L (ref 70–180)

## 2023-04-27 LAB — Unfractionated Heparin, by anti-Xa
HEPARIN UNFRACTIONATED: 0.47 [IU]/mL
HEPARIN UNFRACTIONATED: 0.42 [IU]/mL

## 2023-04-27 LAB — Differential Automated: ABSOLUTE NEUT COUNT: 3.7 10*3/uL (ref 1.80–6.90)

## 2023-04-27 LAB — Zinc, Plasma: ZINC, SERUM OR PLASMA: 94 ug/dL (ref 60.0–120.0)

## 2023-04-27 LAB — CBC: ABSOLUTE NUCLEATED RBC COUNT: 0 10*3/uL (ref 0.00–0.00)

## 2023-04-27 LAB — Magnesium: MAGNESIUM: 1.6 meq/L (ref 1.4–1.9)

## 2023-04-27 LAB — Phosphorus: PHOSPHORUS: 4.4 mg/dL (ref 2.3–4.4)

## 2023-04-27 MED ADMIN — ENOXAPARIN SODIUM 60 MG/0.6ML IJ SOSY: 50 mg | SUBCUTANEOUS | @ 17:00:00 | Stop: 2023-04-28

## 2023-04-27 MED ADMIN — THIAMINE HCL 100 MG PO TABS (MULTI GPI): 100 mg | ORAL | @ 15:00:00 | Stop: 2023-05-24 | NDC 77333093425

## 2023-04-27 MED ADMIN — PANCRELIPASE (LIP-PROT-AMYL) 36000-114000 UNITS PO CPEP: 36000 [IU] | ORAL | @ 22:00:00 | Stop: 2023-05-01

## 2023-04-27 MED ADMIN — PANTOPRAZOLE SODIUM 40 MG PO TBEC: 40 mg | ORAL | @ 03:00:00 | Stop: 2023-05-01 | NDC 50268063911

## 2023-04-27 MED ADMIN — HYDROMORPHONE HCL 1 MG/ML IJ SOLN: .5 mg | INTRAVENOUS | Stop: 2023-04-27 | NDC 00409128331

## 2023-04-27 MED ADMIN — MAGNESIUM SULFATE 4 GM/100ML IV SOLN: 4 g | INTRAVENOUS | @ 14:00:00 | Stop: 2023-04-27 | NDC 00409672923

## 2023-04-27 MED ADMIN — ACETAMINOPHEN 500 MG PO TABS: 1000 mg | ORAL | @ 06:00:00 | Stop: 2023-05-01 | NDC 00904673061

## 2023-04-27 MED ADMIN — FLUTICASONE-SALMETEROL 250-50 MCG/ACT IN AEPB: 1 | RESPIRATORY_TRACT | @ 17:00:00 | Stop: 2023-05-01 | NDC 00054032756

## 2023-04-27 MED ADMIN — GABAPENTIN 100 MG PO CAPS: 200 mg | ORAL | @ 18:00:00 | Stop: 2023-04-27 | NDC 00904666561

## 2023-04-27 MED ADMIN — ZZ IMS TEMPLATE: 150 mg | ORAL | @ 03:00:00 | Stop: 2023-05-24 | NDC 60687034911

## 2023-04-27 MED ADMIN — POLYETHYLENE GLYCOL 3350 17 G PO PACK: 17 g | ORAL | @ 15:00:00 | Stop: 2023-05-01 | NDC 60687043199

## 2023-04-27 MED ADMIN — FLUTICASONE-SALMETEROL 250-50 MCG/ACT IN AEPB: 1 | RESPIRATORY_TRACT | @ 05:00:00 | Stop: 2023-05-01

## 2023-04-27 MED ADMIN — B COMPLEX PO CAPS: 1 | ORAL | @ 15:00:00 | Stop: 2023-05-01

## 2023-04-27 MED ADMIN — HYDROMORPHONE HCL 1 MG/ML IJ SOLN: .5 mg | INTRAVENOUS | @ 18:00:00 | Stop: 2023-04-27 | NDC 00409128331

## 2023-04-27 MED ADMIN — CHOLESTYRAMINE 4 G PO PACK: 4 g | ORAL | Stop: 2023-05-23 | NDC 27241013421

## 2023-04-27 MED ADMIN — OXYCODONE HCL 5 MG PO TABS: 7.5 mg | ORAL | @ 15:00:00 | Stop: 2023-05-01 | NDC 68084035411

## 2023-04-27 MED ADMIN — CHOLESTYRAMINE 4 G PO PACK: 4 g | ORAL | @ 19:00:00 | Stop: 2023-05-01

## 2023-04-27 MED ADMIN — PANCRELIPASE (LIP-PROT-AMYL) 36000-114000 UNITS PO CPEP: 36000 [IU] | ORAL | Stop: 2023-05-01 | NDC 00032301613

## 2023-04-27 MED ADMIN — HEPARIN (PORCINE) IN NACL 25000-0.45 UT/250ML-% IV SOLN: 800 [IU]/h | INTRAVENOUS | @ 17:00:00 | Stop: 2023-04-27

## 2023-04-27 MED ADMIN — RANOLAZINE ER 500 MG PO TB12: 500 mg | ORAL | @ 03:00:00 | Stop: 2023-05-01

## 2023-04-27 MED ADMIN — NICOTINE 7 MG/24HR TD PT24: 7 mg | TRANSDERMAL | @ 15:00:00 | Stop: 2023-05-01 | NDC 00536589488

## 2023-04-27 MED ADMIN — ATORVASTATIN CALCIUM 40 MG PO TABS: 40 mg | ORAL | @ 03:00:00 | Stop: 2023-05-01

## 2023-04-27 MED ADMIN — ATORVASTATIN CALCIUM 40 MG PO TABS: 40 mg | ORAL | @ 03:00:00 | Stop: 2023-05-25 | NDC 68084009911

## 2023-04-27 MED ADMIN — OXYCODONE HCL 5 MG PO TABS: 7.5 mg | ORAL | @ 11:00:00 | Stop: 2023-05-01 | NDC 68084035411

## 2023-04-27 MED ADMIN — HEPARIN (PORCINE) IN NACL 25000-0.45 UT/250ML-% IV SOLN: 800 [IU]/h | INTRAVENOUS | @ 14:00:00 | Stop: 2023-04-27 | NDC 63323051701

## 2023-04-27 MED ADMIN — HYDROMORPHONE HCL 1 MG/ML IJ SOLN: .5 mg | INTRAVENOUS | @ 06:00:00 | Stop: 2023-04-27 | NDC 00409128331

## 2023-04-27 MED ADMIN — PANTOPRAZOLE SODIUM 40 MG PO TBEC: 40 mg | ORAL | @ 03:00:00 | Stop: 2023-05-01

## 2023-04-27 MED ADMIN — CHOLESTYRAMINE 4 G PO PACK: 4 g | ORAL | @ 12:00:00 | Stop: 2023-05-23

## 2023-04-27 MED ADMIN — HYDROMORPHONE HCL 1 MG/ML IJ SOLN: .5 mg | INTRAVENOUS | @ 19:00:00 | Stop: 2023-04-27 | NDC 00409128331

## 2023-04-27 MED ADMIN — ACETAMINOPHEN 500 MG PO TABS: 1000 mg | ORAL | Stop: 2023-05-01 | NDC 00904673061

## 2023-04-27 MED ADMIN — ACETAMINOPHEN 500 MG PO TABS: 1000 mg | ORAL | @ 23:00:00 | Stop: 2023-05-01 | NDC 00904673061

## 2023-04-27 MED ADMIN — OXYCODONE HCL 5 MG PO TABS: 7.5 mg | ORAL | @ 03:00:00 | Stop: 2023-05-01 | NDC 68084035411

## 2023-04-27 MED ADMIN — DULOXETINE HCL 20 MG PO CPEP: 40 mg | ORAL | @ 15:00:00 | Stop: 2023-04-27 | NDC 60687072311

## 2023-04-27 MED ADMIN — NICOTINE 7 MG/24HR TD PT24: 7 mg | TRANSDERMAL | @ 15:00:00 | Stop: 2023-05-24

## 2023-04-27 MED ADMIN — OXYCODONE HCL 5 MG PO TABS: 7.5 mg | ORAL | @ 23:00:00 | Stop: 2023-05-01 | NDC 68084035411

## 2023-04-27 MED ADMIN — CHOLESTYRAMINE 4 G PO PACK: 4 g | ORAL | @ 18:00:00 | Stop: 2023-05-01 | NDC 68382052860

## 2023-04-27 MED ADMIN — OXYCODONE HCL 5 MG PO TABS: 7.5 mg | ORAL | @ 03:00:00 | Stop: 2023-04-30

## 2023-04-27 MED ADMIN — CHOLECALCIFEROL 25 MCG (1000 UT) PO TABS: 50 ug | ORAL | @ 15:00:00 | Stop: 2023-05-01

## 2023-04-27 MED ADMIN — RANOLAZINE ER 500 MG PO TB12: 500 mg | ORAL | @ 15:00:00 | Stop: 2023-05-01 | NDC 60687054911

## 2023-04-27 MED ADMIN — ZZ IMS TEMPLATE: 150 mg | ORAL | @ 15:00:00 | Stop: 2023-05-24 | NDC 60687034911

## 2023-04-27 MED ADMIN — LACTATED RINGERS IV SOLN: 45 mL/h | INTRAVENOUS | @ 11:00:00 | Stop: 2023-04-27 | NDC 00338011704

## 2023-04-27 MED ADMIN — GABAPENTIN 100 MG PO CAPS: 200 mg | ORAL | @ 12:00:00 | Stop: 2023-04-27 | NDC 00904666561

## 2023-04-27 MED ADMIN — PANTOPRAZOLE SODIUM 40 MG PO TBEC: 40 mg | ORAL | @ 15:00:00 | Stop: 2023-05-01 | NDC 50268063911

## 2023-04-27 MED ADMIN — TRAZODONE HCL 25 MG PO TABS: 50 mg | ORAL | @ 03:00:00 | Stop: 2023-05-01

## 2023-04-27 MED ADMIN — HYDROMORPHONE HCL 1 MG/ML IJ SOLN: .5 mg | INTRAVENOUS | @ 12:00:00 | Stop: 2023-04-27 | NDC 00409128331

## 2023-04-27 MED ADMIN — ACETAMINOPHEN 500 MG PO TABS: 1000 mg | ORAL | @ 15:00:00 | Stop: 2023-05-01 | NDC 00904673061

## 2023-04-27 MED ADMIN — PANCRELIPASE (LIP-PROT-AMYL) 36000-114000 UNITS PO CPEP: 36000 [IU] | ORAL | @ 15:00:00 | Stop: 2023-05-01

## 2023-04-27 MED ADMIN — ZZ IMS TEMPLATE: 150 mg | ORAL | @ 03:00:00 | Stop: 2023-05-24

## 2023-04-27 MED ADMIN — RANOLAZINE ER 500 MG PO TB12: 500 mg | ORAL | @ 03:00:00 | Stop: 2023-05-24 | NDC 60687054911

## 2023-04-27 MED ADMIN — GABAPENTIN 100 MG PO CAPS: 200 mg | ORAL | @ 03:00:00 | Stop: 2023-04-27 | NDC 60687058011

## 2023-04-27 MED ADMIN — GABAPENTIN 100 MG PO CAPS: 200 mg | ORAL | @ 03:00:00 | Stop: 2023-04-27

## 2023-04-27 NOTE — Progress Notes
Pharmaceutical Services - Heparin Consult Management Note    Objective Data   Allergies: Hydrocodone, Ibuprofen, and Morphine  Height:   Most recent documented height   04/23/23 1.626 m (5' 4.02'')     Actual Weight:   Most recent documented weight   04/11/23 50.9 kg       Heparin drip administrations (last 48 hours)       Date/Time Action Medication Dose Rate    04/27/23 0629 New Bag/ Syringe/ Cartridge    heparin 25,000 units in 0.45% NaCl 250 mL drip RTU 800 Units/hr 8 mL/hr    04/26/23 0654 Rate/Dose Change*    heparin 25,000 units in 0.45% NaCl 250 mL drip RTU 800 Units/hr 8 mL/hr    04/26/23 0220 New Bag/ Syringe/ Cartridge    heparin 25,000 units in 0.45% NaCl 250 mL drip RTU 850 Units/hr 8.5 mL/hr            Current Labs  No results for input(s): ''APTT'' in the last 48 hours.  Recent Labs     04/27/23  0500   HEPANTIXAUFH 0.42     Recent Labs     04/27/23  0500   WBC 7.64   HGB 8.8*   HCT 29.5*   MCV 87.5   PLT 372       INR   Date Value Ref Range Status   04/23/2023 0.9 . Final     Comment:     Therapeutic Range: INR 2-3  Mechanical Valves: INR 2.5-3.5       INR (Outside Lab)   Date Value Ref Range Status   03/09/2021 0.90  Final     Bilirubin,Total   Date Value Ref Range Status   04/24/2023 0.2 0.1 - 1.2 mg/dL Final       Assessment:  Protocol: Regular Intensity: Anti-Xa goal 0.3-0.7 or aPTT goal 66-108  Anti-Xa aPTT Re-bolus if Ordered  (NTE 5,000 units) /  Hold Infusion Rate Change Check Anti-Xa/aPTT   <0.1 <46 50 units/kg Increase by 4 units/kg/hr 6 hours after rate change   0.1-0.19 46-55.9 25 units/kg Increase by 3 units/kg/hr    0.2-0.29 56-65.9 - Increase by 2 units/kg/hr    0.3-0.7 66-108 Continue current rate 6 hours;  If therapeutic x2, recheck qAM   0.71-0.8 108.1-118 - Decrease by 1 unit/kg/hr 6 hours after rate change   0.81-0.9 118.1-128 - Decrease by 2 units/kg/hr    0.91-1 128.1-140 Hold for 1 hour Decrease by 3 units/kg/hr    >1 >140 Hold infusion and repeat STAT anti-Xa/aPTT in 1 hour.  If anti-Xa ? 1 or aPTT ? 140, restart infusion and decrease by 4 units/kg/hr. Repeat anti-Xa/aPTT 6 hours after rate change.  If anti-Xa > 1 or aPTT > 140, continue to hold and repeat anti-Xa/aPTT in 1 hour. Notify provider.       Indication:  SMA stenosis w/ c/f thrombosis  Goal: anti-Xa 0.3-0.7  Dosing weight: 49 kg  Concurrent warfarin: No  Concomitant medications that may contribute to bleeding: none  Pending invasive procedures: TBD  Signs and symptoms of bleeding/bruising within 24 hours: No    Plan:  Donna Gross is a 57 y.o. female who has been referred to pharmacy for therapeutic heparin management.     Bolus: No bolus at this time  Infusion: Continue at 800 units/hr    Next anti-Xa ordered for 5/8 at 0400   Plan was communicated to RN.  Pharmacy will continue to monitor the patient?s clinical progress and communicate any  changes with treatment team.  For questions about this patient's heparin therapy, please call [phone] [facility]) or [phone] [facility]).    Noreene Filbert, PharmD, BCPS  Inpatient Clinical Pharmacist

## 2023-04-27 NOTE — Other
Patient's Clinical Goal:   Clinical Goal(s) for the Shift: VSS, comfort & safety, pain management  Identify possible barriers to advancing the care plan: none  Stability of the patient: Moderately Unstable - medium risk of patient condition declining or worsening    Progression of Patient's Clinical Goal:     Problem/Goal:    Colitis, SMA stenosis  Response to Plan of Care and any changes in condition (clinical condition, any concerning assessments, events, interventions; response to POC effectiveness or changes made):   Pt A&Ox4, BMAT 4, RA, NSR, Cont O2 Sat  SBP: 90s overnight  C/o 8/10 upper abdominal pain, PRN Dilaudid IVP and Oxycodone PO administered, relief noted  No BM overnight, MD requesting picture of BM  NPO since MN for potential procedure  Heparin drip @ 4mL/hr, Anti-Xa therapeutic  4G Mag Sulfate administered     Interdisciplinary communication (team member communication):   none  Psychosocial communication (family or patient issues potentially affecting care):   none  Plan and Disposition (progression toward specific goals of care/hospitalization and discharge):  Continue pain regimen  Pending procedure this morning  DCP: Home w/ no needs pending medical stability

## 2023-04-27 NOTE — Progress Notes
Reason for Note: D/C Transportation       SW received message from MD and CM that patient would need roundtrip transportation to [facility] for an angio procedure Thursday, May 9th, 2024.     SW set-up an gurney NEMT from [facility] [phone]) for Thursday 04/29/23 pick-up at 8:30 AM [place] [facility] to [facility] hospital.     Drop-off Address: [facility] - [address] [place], [place] [address]    SW requested a ride pick-up from [facility] for 8PM back to [facility].    When SW called [facility], SW was told there was no PCS form for authorization for gurney so SW provided info to have a form faxed to MD to complete here at [facility]. Pending approval.    SW will remain available as needed.              Aura Fey, LCSW  Pager [phone]

## 2023-04-27 NOTE — Progress Notes
Family Medicine Progress Note    Primary Care Provider: Kelle Darting, MD  Attending Physician: Debbe Mounts, MD  Senior Resident Physician: Harlan Stains, MD  Junior Resident Physician: Burnell Blanks, MD    Chief complaint: Abdominal pain    Subjective:     Overnight events:   - NAEON    This AM:  - Patient feels as though pain is mostly well-controlled  - Reports that her abdominal pain is unchanged from previous  - Able to eat breakfast yesterday, ate all of her pancakes  - Denies fevers, chills, vomiting, constipation, SOB, chest pain    PRNs: Oxycodone 7.5mg  x3, Dilaudid 0.5 mg IV x3      Medications:     Continuous Medications:    heparin 25,000 units/250 mL drip 800 Units/hr (04/27/23 0629)    lactated ringers 45 mL/hr (04/27/23 0358)     Scheduled Medications:     acetaminophen  1,000 mg Oral Q8H    atorvastatin  40 mg Oral QHS    cholestyramine  4 g Oral TID AC    DULoxetine  40 mg Oral Daily    fluticasone-salmeterol  1 puff Inhalation BID    gabapentin  200 mg Oral TID    magnesium sulfate IV  4 g Intravenous Once    nicotine patch  7 mg Transdermal Daily    pancrelipase (Lip-Prot-Amyl)  36,000 units of lipase Oral TID w/meals    pantoprazole  40 mg Oral BID    polyethylene glycol  17 g Oral Daily    QUEtiapine  150 mg Oral BID    ranolazine  500 mg Oral BID    thiamine  100 mg Oral Daily    vitamin B complex  1 capsule Oral Daily    vitamin D (cholecalciferol)  50 mcg Oral Daily     PRN Medications: albuterol, bisacodyl, calcium carbonate, HYDROmorphone, hyoscyamine, ipratropium-albuterol, naloxone, ondansetron, oxyCODONE, oxyCODONE, senna, sodium chloride, sodium chloride, traZODone      Physical Exam:     Vital signs in last 24 hours:   Temp:  [36.3 ?C (97.4 ?F)-37 ?C (98.6 ?F)] 36.3 ?C (97.4 ?F)  Heart Rate:  [68-94] 71  Resp:  [16-20] 20  BP: (91-115)/(55-79) 113/64  NBP Mean:  [67-91] 79  SpO2:  [93 %-96 %] 96 %     I/O last 2 completed shifts:  In: 3045.2 [P.O.:1970; I.V.:1025.2; IV Piggyback:50]  Out: -   BMs: none charted in last 24 hours    General: Alert, comfortable, NAD  Head: Atraumatic, normocephalic  Eyes: PERRL. EOM intact. Sclera are anincteric and noninjected.  Mouth/Throat: Mucous membranes moist  Cardiac: RRR, nl S1/S2, no m/r/g, no thrills on palpation.   Lungs: Respiratory effort nl, CTAB, no crackles or wheezes  Abdomen: Normoactive bowel sounds. No masses or organomegaly appreciated though exam limited by pain. Voluntary guarding with palpation, no rebound. Diffusely TTP. Multiple abdominal surgery scars.   Extr: No lower extremity edema, warm and well-perfused.  Skin: Warm and dry. No significant rashes or lesions noted.  Neuro: A&O x4/4.   Psych: Normal mood and affect      Laboratory Data:     Recent Labs     04/27/23  0500   WBC 7.64   HGB 8.8*   HCT 29.5*   MCV 87.5   PLT 372   NEUTPCT 48.4   NA 139   K 4.5   CL 107*   CO2 24   BUN 14   CREAT  1.25   MG 1.6   CALCIUM 8.7       No results for input(s): ''ALT'', ''AST'', ''BILITOT'', ''ALKPHOS'', ''ALBUMIN'' in the last 72 hours.    No results for input(s): ''TSH'', ''HGBA1C'' in the last 72 hours.  No results for input(s): ''CHOL'', ''CHOLHDL'', ''CHOLDLCAL'', ''CHOLDLQ'', ''TRIGLY'' in the last 72 hours.  No results for input(s): ''TROPONIN'', ''BNP'' in the last 72 hours.  No results for input(s): ''PT'', ''INR'', ''APTT'' in the last 72 hours.    No results for input(s): ''BACULBLD'', ''BACULUR'', ''BACULRSP'' in the last 72 hours.  No results for input(s): ''GLUCOSEPOC'' in the last 72 hours.    Urinalysis:  Invalid input(s): ''URINEPH'', ''URINEWBC'', ''UAWBC''    Studies:     Imaging:   CT abd+pelvis angiogram wo+w contrast   Final Result by Ellard Artis., MD (05/03 1753)   IMPRESSION:      No active bleed. Near interval resolution of the previously visualized small volume contrast in the terminal ileum and cecum, which may have been transient hemorrhage due to right sided colitis given proximal colonic wall thickening and mucosal    hyperenhancement and/or mild angiodysplasia given apparent serpiginous appearance of the terminal branch of the ileocolic artery. No bowel ischemia.                 Signed by: Verdell Carmine   04/23/2023 5:53 PM      CT abd+pelvis angiogram wo+w contrast   Final Result by Albin Fischer, MD (05/03 1254)   IMPRESSION:         1. 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 Arteries beyond their origin are patent.   2. Circumferential wall thickening in the ascending and proximal transverse colon, suspicious for colitis which may be ischemic or infectious in etiology.   3. High density foci at the inferior cecum and terminal ileal loops, seen on venous phase imaging and not included in the field-of-view on the noncontrast and arterial phase scans, suspicious for extravasation at the cecum with retrograde flow into    distal ileal loops versus extravasation of the distal ileal loops with some antegrade flow. Consider follow-up CT in 6 to 8 hours and/or IR consultation.      These results were discussed with [name]. LANDEFELD at 04/23/2023 12:51 PM.               Signed by: Albin Fischer   04/23/2023 12:54 PM      Cath Lab Peripheral Diagnostic and Interventional    (Results Pending)       Assessment and Plan:     Joesphine Schemm is a 57 y.o. female with PMHx of anxiety, depression, emphysema, fibromyalgia, GERD, SMA stenosis who presents with intractable abdominal pain. Likely component of pain is from SMA stenosis. Has been hospitalized many times for similar presentations with negative work-up aside from SMA stenosis. Admitted pending Vascular surgery management to address SMA syndrome.     #Acute on chronic abdominal pain, POA  #SMA stenosis, chronic, POA  #Celiac stenosis, POA  #CT evidence of CAD, POA  Pt been suffering with abdominal pain for approx. 1 year. Has been admitted several times previously with similar complaints. Recently admitted for similar presentation but CT imaging now with evidence of CT extravasation in cecum and ileum c/f possible worsening ischemia. In addition SMA further stenosis and now mild celiac stenosis in addition. Previously evaluated by vascular  surgery and IR for interventions but those teams not favorable to SMA as culprit of pain given negative lactate. New CT findings raise greater c/f SMA occlusion complications. Reassuringly, short interval repeat CT w/o evidence of further ischemia or bleeding. While lactate remains negative, query intermittent elevation not appreciated on our lab collections. Patients symptoms fit with SMA syndrome- worsening postprandial symptoms, food aversion leading to acute refusal, and abdominal pain. Given clinical stability, no concern for acute bowel ischemia despite increase in lactate on 5/4, pain and PO intolerance likely due to her chronic superior mesenteric stenosis.  -Vascular surgery consult, appreciate recs   - Plan for angio procedure at [facility] on Thursday 5/9   - Will hold Lovenox starting Wednesday PM   - NPO @ MN on 5/9  -Pain management:  - Tylenol ATC  - Gabapentin 200mg  TID  - Oxycodone sliding scale 5/7.5 q4h PRN  - Dilaudid BT pain 0.5mg  q6h PRN-- reserving for breakthrough pain given softer BP with evidence of drop after IV meds  -pain management consult, appreciate recs  -pantoprazole 40 mg BID  -calcium carbonate PRN  -DC heparin drip --> therapeutic Lovenox BID  -continue atrovastatin 40 mg at bedtime  -consider colonoscopy     #Diarrhea, chronic, POA, resolved  #Nausea/Vomiting, chronic, POA  #Constipation, acute, POA  Pt with chronic nausea/vomiting/diarrhea over last year. So far work up has included negative lab studies: c.diff, celiac panel, h.pylori, parasitic enteric panel. Pt unable to tolerate colonoscopy and negative EGD. Given CT findings of colitis could query infectious etiology but lack of fever, leukocytosis, and sick contacts with fairly recent workup while with current symptoms negative point against. Query poorly controlled GERD. Also consider pain contributing n/v. Diarrhea has now resolved, patient has now been constipated with last BM 5/4.  -Restart bowel regimen   -continue home hyoscyamine 125 mcg q4hrs PRN  -zofran 4mg  q6hrs  -Bowel regimen as below  -XR KUB to eval for ileus vs. constipation burden     #Poor PO intake, chronic, POA  #Severe protein malnutrition, POA  #Severe muscle loss with cachexia due to severe malnutrition, POA  Previously evaluated by nutrition who had recommended that pt receive boost TID for assistance with malnutrition likely 2/2 poor PO intake from abdominal pain. Reports weight loss of approx 50lbs in the past year. Poor appetite 2/2 pain.   -Regular diet  -Discontinue fluids given adequate PO intake  -nutrition consult, appreciate recs  -nutrition supplements TID  -thiamine, vitamin D, vitamin B supplementation      #Normocytic anemia, Chronic POA  Pt presents with hgb of 9.8 c/w her baseline of 9-10. Query anemia 2/2 to nutritional deficincies from poor PO intake. From chart review, no recent vitamin or iron studies. Will order to further characterize. Denies SS of bleeding. Folate/B12 WNL. Iron studies c/f for element of IDA. Given current pain and not wanting to mask diarrhea, will hold on iron supplementation for now but consider when more clinically stable  -daily cbc  -consider iron supplementation as outpatient     #Tobacco use, POA  Previous PPD smoker from age 28 and reduced over last 7 years to now 1 cigarette all day. Requests nicotine patch while IP.   -nicotine patch  -counseled on nicotine cessation     #Anxiety, chronic, POA  #Major Depression, chronic, POA  #Fibromyalgia, chronic, POA   #Insomnia, chronic, POA  Chronic conditions. Will continue home medications.   - Continue Seroquel 150mg  BID  - Continue duloxetine to 40 mg  qday   - Trazodone 50 mg at bedtime PRN    #FEN/GI:   - Regular diet  - Bowel regimen:   - Miralax BID   - senna at bedtime   - bisacodyl suppository PRN    #Access: pIV    #Prophylaxis:  - Lovenox as above    #Code: Full Code    Stark Klein, MD who agrees with the above assessment and plan unless otherwise noted in addendum.    Author:  Burnell Blanks, MD 04/27/2023 6:44 AM     .Delena Serve Attending Addendum:    I have seen and examined the patient, and discussed the plan of care with the resident. I am in agreement with the findings, assessments and treatment plans as stated in this document. Patient continues to endorse diffuse abdominal pain moderately controlled with current pain regimen. Tolerating increased PO with no emesis or diarrhea, now concern for constipation given no BM x 3 days and opiate use. Appreciate Vascular Surgery recommendations, plan for aortogram on Thursday 04/29/23. Will switch patient from heparin gtt to therapeutic lovenox. Pain Management consulted to help with control of patient's pain to limit opiate use given worsening constipation and hypotension. Patient declining [facility] or Integrative Medicine interventions at this time.     Debbe Mounts, MD

## 2023-04-27 NOTE — Other
Patient's Clinical Goal:   Clinical Goal(s) for the Shift: VSS, comfort, safety, monitor heparin drip, pain management  Identify possible barriers to advancing the care plan:   Stability of the patient: Moderately Unstable - medium risk of patient condition declining or worsening    Progression of Patient's Clinical Goal:     Problem/Goal: Colitis, SMA stenosis    Response to Plan of Care and any changes in condition (clinical condition, any concerning assessments, events, interventions; response to POC effectiveness or changes made):   A&O x4, BMAT 4  Cardiac monitor: NSR. Pt denies any chest pain or discomfort.   SMA stenosis: Plan for angiogram at [facility] on Thursday 04/29/23.   Heparin drip d/c'd during shift.  Started on lovenox with plan to hold starting Wednesday PM. Pt refused lovenox during shift.   Continuous pulse ox, saturating >95% on room air. No SOB noted.   Abdomen: slightly distended and firm. KUB done during shift: large stool burden. Last BM: 04/24/23. Miralax given. Pending stool picture per MD order.   Pain managed with tylenol, PRN oxycodone, PRN dilaudid.    Interdisciplinary communication (team member communication):   MD notified pt refused lovenox.     Psychosocial communication (family or patient issues potentially affecting care):     Plan and Disposition (progression toward specific goals of care/hospitalization and discharge):  Plan: angio thursday  D/C home pending clinical improvement.

## 2023-04-28 LAB — CBC: HEMATOCRIT: 28.9 — ABNORMAL LOW (ref 34.9–45.2)

## 2023-04-28 LAB — Differential Automated: ABSOLUTE NEUT COUNT: 3.7 10*3/uL (ref 1.80–6.90)

## 2023-04-28 LAB — Basic Metabolic Panel
CALCIUM: 8.9 mg/dL (ref 8.6–10.4)
SODIUM: 137 mmol/L (ref 135–146)

## 2023-04-28 LAB — Phosphorus: PHOSPHORUS: 4.7 mg/dL — ABNORMAL HIGH (ref 2.3–4.4)

## 2023-04-28 LAB — Magnesium: MAGNESIUM: 1.7 meq/L (ref 1.4–1.9)

## 2023-04-28 MED ADMIN — POLYETHYLENE GLYCOL 3350 17 G PO PACK: 17 g | ORAL | @ 21:00:00 | Stop: 2023-04-28 | NDC 60687043199

## 2023-04-28 MED ADMIN — ACETAMINOPHEN 500 MG PO TABS: 1000 mg | ORAL | @ 08:00:00 | Stop: 2023-05-01 | NDC 00904673061

## 2023-04-28 MED ADMIN — GABAPENTIN 300 MG PO CAPS: 300 mg | ORAL | @ 21:00:00 | Stop: 2023-05-01 | NDC 60687059111

## 2023-04-28 MED ADMIN — OXYCODONE HCL 5 MG PO TABS: 7.5 mg | ORAL | @ 12:00:00 | Stop: 2023-05-01 | NDC 68084035411

## 2023-04-28 MED ADMIN — PANCRELIPASE (LIP-PROT-AMYL) 36000-114000 UNITS PO CPEP: 36000 [IU] | ORAL | @ 17:00:00 | Stop: 2023-05-01 | NDC 00032301613

## 2023-04-28 MED ADMIN — MAGNESIUM SULFATE 2 GM/50ML IV SOLN: 2 g | INTRAVENOUS | @ 17:00:00 | Stop: 2023-04-28 | NDC 25021061281

## 2023-04-28 MED ADMIN — SENNOSIDES 8.6 MG PO TABS: 1 | ORAL | @ 04:00:00 | Stop: 2023-04-28 | NDC 00904725260

## 2023-04-28 MED ADMIN — NICOTINE 7 MG/24HR TD PT24: 7 mg | TRANSDERMAL | @ 17:00:00 | Stop: 2023-05-24

## 2023-04-28 MED ADMIN — CHOLECALCIFEROL 25 MCG (1000 UT) PO TABS: 50 ug | ORAL | @ 17:00:00 | Stop: 2023-05-01

## 2023-04-28 MED ADMIN — OXYCODONE HCL 5 MG PO TABS: 7.5 mg | ORAL | @ 21:00:00 | Stop: 2023-05-01 | NDC 68084035411

## 2023-04-28 MED ADMIN — HYDROMORPHONE HCL 1 MG/ML IJ SOLN: .5 mg | INTRAVENOUS | @ 06:00:00 | Stop: 2023-05-01 | NDC 00409128331

## 2023-04-28 MED ADMIN — PANCRELIPASE (LIP-PROT-AMYL) 36000-114000 UNITS PO CPEP: 36000 [IU] | ORAL | @ 03:00:00 | Stop: 2023-05-24 | NDC 00032301613

## 2023-04-28 MED ADMIN — ENOXAPARIN SODIUM 60 MG/0.6ML IJ SOSY: 50 mg | SUBCUTANEOUS | @ 06:00:00 | Stop: 2023-04-28 | NDC 71288043484

## 2023-04-28 MED ADMIN — OXYCODONE HCL 5 MG PO TABS: 7.5 mg | ORAL | @ 08:00:00 | Stop: 2023-04-30 | NDC 68084035411

## 2023-04-28 MED ADMIN — HYDROMORPHONE HCL 1 MG/ML IJ SOLN: .5 mg | INTRAVENOUS | @ 01:00:00 | Stop: 2023-05-01 | NDC 00409128331

## 2023-04-28 MED ADMIN — FLUTICASONE-SALMETEROL 250-50 MCG/ACT IN AEPB: 1 | RESPIRATORY_TRACT | @ 17:00:00 | Stop: 2023-05-01

## 2023-04-28 MED ADMIN — NICOTINE 7 MG/24HR TD PT24: 7 mg | TRANSDERMAL | @ 17:00:00 | Stop: 2023-05-24 | NDC 00536589488

## 2023-04-28 MED ADMIN — CHOLESTYRAMINE 4 G PO PACK: 4 g | ORAL | @ 21:00:00 | Stop: 2023-05-01

## 2023-04-28 MED ADMIN — HYDROMORPHONE HCL 1 MG/ML IJ SOLN: .5 mg | INTRAVENOUS | Stop: 2023-05-01 | NDC 00409128331

## 2023-04-28 MED ADMIN — GABAPENTIN 300 MG PO CAPS: 300 mg | ORAL | @ 04:00:00 | Stop: 2023-05-01 | NDC 60687059111

## 2023-04-28 MED ADMIN — POLYETHYLENE GLYCOL 3350 17 G PO PACK: 17 g | ORAL | @ 17:00:00 | Stop: 2023-05-18 | NDC 60687043199

## 2023-04-28 MED ADMIN — ENOXAPARIN SODIUM 60 MG/0.6ML IJ SOSY: 50 mg | SUBCUTANEOUS | @ 17:00:00 | Stop: 2023-04-28 | NDC 71288043484

## 2023-04-28 MED ADMIN — POLYETHYLENE GLYCOL 3350 17 G PO PACK: 17 g | ORAL | @ 04:00:00 | Stop: 2023-05-18 | NDC 60687043199

## 2023-04-28 MED ADMIN — ATORVASTATIN CALCIUM 40 MG PO TABS: 40 mg | ORAL | @ 04:00:00 | Stop: 2023-05-25 | NDC 68084009911

## 2023-04-28 MED ADMIN — BISACODYL 10 MG RE SUPP: 10 mg | RECTAL | @ 21:00:00 | Stop: 2023-05-01 | NDC 00574705012

## 2023-04-28 MED ADMIN — OXYCODONE HCL 5 MG PO TABS: 7.5 mg | ORAL | @ 04:00:00 | Stop: 2023-05-01 | NDC 68084035411

## 2023-04-28 MED ADMIN — GABAPENTIN 300 MG PO CAPS: 300 mg | ORAL | @ 12:00:00 | Stop: 2023-05-01 | NDC 60687059111

## 2023-04-28 MED ADMIN — ZZ IMS TEMPLATE: 150 mg | ORAL | @ 17:00:00 | Stop: 2023-05-24 | NDC 60687034911

## 2023-04-28 MED ADMIN — THIAMINE HCL 100 MG PO TABS (MULTI GPI): 100 mg | ORAL | @ 17:00:00 | Stop: 2023-05-01 | NDC 77333093425

## 2023-04-28 MED ADMIN — FLUTICASONE-SALMETEROL 250-50 MCG/ACT IN AEPB: 1 | RESPIRATORY_TRACT | @ 04:00:00 | Stop: 2023-05-01 | NDC 00378932132

## 2023-04-28 MED ADMIN — HYDROMORPHONE HCL 1 MG/ML IJ SOLN: .5 mg | INTRAVENOUS | @ 14:00:00 | Stop: 2023-05-01 | NDC 00409128331

## 2023-04-28 MED ADMIN — CHOLESTYRAMINE 4 G PO PACK: 4 g | ORAL | @ 01:00:00 | Stop: 2023-05-01 | NDC 27241013421

## 2023-04-28 MED ADMIN — RANOLAZINE ER 500 MG PO TB12: 500 mg | ORAL | @ 04:00:00 | Stop: 2023-05-01 | NDC 60687054911

## 2023-04-28 MED ADMIN — ACETAMINOPHEN 500 MG PO TABS: 1000 mg | ORAL | Stop: 2023-05-23 | NDC 00904673061

## 2023-04-28 MED ADMIN — PANCRELIPASE (LIP-PROT-AMYL) 36000-114000 UNITS PO CPEP: 36000 [IU] | ORAL | @ 21:00:00 | Stop: 2023-05-01

## 2023-04-28 MED ADMIN — PANTOPRAZOLE SODIUM 40 MG PO TBEC: 40 mg | ORAL | @ 17:00:00 | Stop: 2023-05-01 | NDC 50268063911

## 2023-04-28 MED ADMIN — ZZ IMS TEMPLATE: 150 mg | ORAL | @ 04:00:00 | Stop: 2023-05-01 | NDC 60687034911

## 2023-04-28 MED ADMIN — HYDROMORPHONE HCL 1 MG/ML IJ SOLN: .5 mg | INTRAVENOUS | @ 18:00:00 | Stop: 2023-05-01 | NDC 00409128331

## 2023-04-28 MED ADMIN — ENOXAPARIN SODIUM 60 MG/0.6ML IJ SOSY: 50 mg | SUBCUTANEOUS | @ 04:00:00 | Stop: 2023-04-28

## 2023-04-28 MED ADMIN — SODIUM CHLORIDE 0.9% IV SOLN (250 ML): 10 mL/h | INTRAVENOUS | @ 17:00:00 | Stop: 2023-05-01 | NDC 00338004902

## 2023-04-28 MED ADMIN — PANTOPRAZOLE SODIUM 40 MG PO TBEC: 40 mg | ORAL | @ 04:00:00 | Stop: 2023-05-01 | NDC 50268063911

## 2023-04-28 MED ADMIN — RANOLAZINE ER 500 MG PO TB12: 500 mg | ORAL | @ 17:00:00 | Stop: 2023-05-24 | NDC 60687054911

## 2023-04-28 MED ADMIN — ACETAMINOPHEN 500 MG PO TABS: 1000 mg | ORAL | @ 17:00:00 | Stop: 2023-05-01 | NDC 00904673061

## 2023-04-28 MED ADMIN — B COMPLEX PO CAPS: 1 | ORAL | @ 17:00:00 | Stop: 2023-05-01

## 2023-04-28 MED ADMIN — DULOXETINE HCL 60 MG PO CPEP: 60 mg | ORAL | @ 17:00:00 | Stop: 2023-05-01 | NDC 60687074511

## 2023-04-28 MED ADMIN — CHOLESTYRAMINE 4 G PO PACK: 4 g | ORAL | @ 17:00:00 | Stop: 2023-05-01 | NDC 27241013421

## 2023-04-28 MED ADMIN — OXYCODONE HCL 5 MG PO TABS: 7.5 mg | ORAL | @ 17:00:00 | Stop: 2023-05-01 | NDC 68084035411

## 2023-04-28 MED ADMIN — TRAZODONE HCL 25 MG PO TABS: 50 mg | ORAL | @ 04:00:00 | Stop: 2023-05-23

## 2023-04-28 NOTE — Progress Notes
Family Medicine Progress Note    Primary Care Provider: Kelle Darting, MD  Attending Physician: Debbe Mounts, MD  Senior Resident Physician: Harlan Stains, MD  Junior Resident Physician: Burnell Blanks, MD    Chief complaint: Abdominal pain    Subjective:     Overnight events:   - NAEON    This AM:  - Reports that her abdominal pain is not better nor worse than previous  - Able to eat breakfast yesterday, had a little bit of dinner  - Denies fevers, chills, vomiting, constipation, SOB, chest pain    PRNs: Oxycodone 7.5mg  x4, Dilaudid 0.5 mg IV x4      Medications:     Continuous Medications:       Scheduled Medications:     acetaminophen  1,000 mg Oral Q8H    atorvastatin  40 mg Oral QHS    cholestyramine  4 g Oral TID AC    DULoxetine  60 mg Oral Daily    enoxaparin  50 mg Subcutaneous BID    fluticasone-salmeterol  1 puff Inhalation BID    gabapentin  300 mg Oral TID    magnesium sulfate IV  2 g Intravenous Once    nicotine patch  7 mg Transdermal Daily    pancrelipase (Lip-Prot-Amyl)  36,000 units of lipase Oral TID w/meals    pantoprazole  40 mg Oral BID    polyethylene glycol  17 g Oral BID    QUEtiapine  150 mg Oral BID    ranolazine  500 mg Oral BID    senna  1 tablet Oral QHS    thiamine  100 mg Oral Daily    vitamin B complex  1 capsule Oral Daily    vitamin D (cholecalciferol)  50 mcg Oral Daily     PRN Medications: albuterol, bisacodyl, calcium carbonate, HYDROmorphone, hyoscyamine, ipratropium-albuterol, naloxone, ondansetron, oxyCODONE, oxyCODONE, sodium chloride, sodium chloride, traZODone      Physical Exam:     Vital signs in last 24 hours:   Temp:  [36.3 ?C (97.3 ?F)-36.7 ?C (98 ?F)] 36.4 ?C (97.5 ?F)  Heart Rate:  [61-76] 67  Resp:  [12-22] 13  BP: (91-134)/(69-84) 91/73  NBP Mean:  [80-98] 80  SpO2:  [93 %-96 %] 96 %     I/O last 2 completed shifts:  In: 356 [P.O.:240; I.V.:16; IV Piggyback:100]  Out: -   BMs: none charted    General: Alert, comfortable, NAD  Head: Atraumatic, normocephalic  Eyes: PERRL. EOM intact. Sclera are anincteric and noninjected.  Mouth/Throat: Mucous membranes moist  Cardiac: RRR, nl S1/S2, no m/r/g, no thrills on palpation.   Lungs: Respiratory effort nl, CTAB, no crackles or wheezes  Abdomen: Normoactive bowel sounds. No masses or organomegaly appreciated though exam limited by pain. Voluntary guarding with palpation, no rebound. Diffusely TTP. Multiple abdominal surgery scars.   Extr: No lower extremity edema, warm and well-perfused.  Skin: Warm and dry. No significant rashes or lesions noted.  Neuro: A&O x4/4.   Psych: Normal mood and affect      Laboratory Data:     Recent Labs     04/28/23  0506   WBC 6.18   HGB 9.0*   HCT 28.9*   MCV 85.8   PLT 340   NEUTPCT 60.0   NA 137   K 4.9   CL 105   CO2 21   BUN 14   CREAT 1.07   MG 1.7   CALCIUM 8.9  No results for input(s): ''ALT'', ''AST'', ''BILITOT'', ''ALKPHOS'', ''ALBUMIN'' in the last 72 hours.    No results for input(s): ''TSH'', ''HGBA1C'' in the last 72 hours.  No results for input(s): ''CHOL'', ''CHOLHDL'', ''CHOLDLCAL'', ''CHOLDLQ'', ''TRIGLY'' in the last 72 hours.  No results for input(s): ''TROPONIN'', ''BNP'' in the last 72 hours.  No results for input(s): ''PT'', ''INR'', ''APTT'' in the last 72 hours.    No results for input(s): ''BACULBLD'', ''BACULUR'', ''BACULRSP'' in the last 72 hours.  No results for input(s): ''GLUCOSEPOC'' in the last 72 hours.    Urinalysis:  Invalid input(s): ''URINEPH'', ''URINEWBC'', ''UAWBC''    Studies:     Imaging:   XR kub portable (1 view)   Final Result by Lenise Arena., MD (05/07 1312)   IMPRESSION:      Normal bowel gas pattern.   Large stool burden.   No signs of pneumatosis or pneumoperitoneum.   Right upper quadrant surgical clips. Left upper quadrant anastomotic suture line.            Signed by: Alexia Freestone   04/27/2023 1:12 PM      CT abd+pelvis angiogram wo+w contrast   Final Result by Ellard Artis., MD (05/03 1753)   IMPRESSION:      No active bleed. Near interval resolution of the previously visualized small volume contrast in the terminal ileum and cecum, which may have been transient hemorrhage due to right sided colitis given proximal colonic wall thickening and mucosal    hyperenhancement and/or mild angiodysplasia given apparent serpiginous appearance of the terminal branch of the ileocolic artery. No bowel ischemia.                 Signed by: Verdell Carmine   04/23/2023 5:53 PM      CT abd+pelvis angiogram wo+w contrast   Final Result by Albin Fischer, MD (05/03 1254)   IMPRESSION:         1. 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 Arteries beyond their origin are patent.   2. Circumferential wall thickening in the ascending and proximal transverse colon, suspicious for colitis which may be ischemic or infectious in etiology.   3. High density foci at the inferior cecum and terminal ileal loops, seen on venous phase imaging and not included in the field-of-view on the noncontrast and arterial phase scans, suspicious for extravasation at the cecum with retrograde flow into    distal ileal loops versus extravasation of the distal ileal loops with some antegrade flow. Consider follow-up CT in 6 to 8 hours and/or IR consultation.      These results were discussed with [name]. LANDEF[place] at 04/23/2023 12:51 PM.               Signed by: Albin Fischer   04/23/2023 12:54 PM      Cath Lab Peripheral Diagnostic and Interventional    (Results Pending)       Assessment and Plan:     Donna Gross is a 57 y.o. female with PMHx of anxiety, depression, emphysema, fibromyalgia, GERD, SMA stenosis who presents with intractable abdominal pain. Likely component of pain is from SMA stenosis. Has been hospitalized many times for similar presentations with negative work-up aside from SMA stenosis. Admitted pending Vascular surgery management to address SMA syndrome.     #Acute on chronic abdominal pain, POA  #SMA stenosis, chronic, POA  #Celiac stenosis, POA  #  CT evidence of CAD, POA  Pt been suffering with abdominal pain for approx. 1 year. Has been admitted several times previously with similar complaints. Recently admitted for similar presentation but CT imaging now with evidence of CT extravasation in cecum and ileum c/f possible worsening ischemia. In addition SMA further stenosis and now mild celiac stenosis in addition. Previously evaluated by vascular surgery and IR for interventions but those teams not favorable to SMA as culprit of pain given negative lactate. New CT findings raise greater c/f SMA occlusion complications. Reassuringly, short interval repeat CT w/o evidence of further ischemia or bleeding. While lactate remains negative, query intermittent elevation not appreciated on our lab collections. Patients symptoms fit with SMA syndrome- worsening postprandial symptoms, food aversion leading to acute refusal, and abdominal pain. Given clinical stability, no concern for acute bowel ischemia despite increase in lactate on 5/4, pain and PO intolerance likely due to her chronic superior mesenteric stenosis.  -Vascular surgery consult, appreciate recs   - Plan for angio procedure at [facility] on Thursday 5/9   - Will hold Lovenox starting Wednesday PM   - NPO @ [facility] on 5/9  -pain management consult, appreciate recs  -Pain regimen:  - Tylenol ATC  - Gabapentin 300mg  TID  - Duloxetine 60mg  qday  - Oxycodone sliding scale 5/7.5 q4h PRN  - Dilaudid BT pain 0.5mg  q4h PRN  -pantoprazole 40 mg BID  -calcium carbonate PRN  -DC heparin drip 5/7 --> therapeutic Lovenox BID  -continue atrovastatin 40 mg at bedtime  -consider colonoscopy     #Diarrhea, chronic, POA, resolved  #Nausea/Vomiting, chronic, POA  #Constipation, acute, POA  Pt with chronic nausea/vomiting/diarrhea over last year. So far work up has included negative lab studies: c.diff, celiac panel, h.pylori, parasitic enteric panel. Pt unable to tolerate colonoscopy and negative EGD. Given CT findings of colitis could query infectious etiology but lack of fever, leukocytosis, and sick contacts with fairly recent workup while with current symptoms negative point against. Query poorly controlled GERD. Also consider pain contributing n/v. Diarrhea has now resolved, patient has now been constipated with last BM 5/4. XR KUB without evidence of ileus, with large stool burden.  -Increase bowel regimen as below  -continue home hyoscyamine 125 mcg q4hrs PRN  -zofran 4mg  q6hrs     #Poor PO intake, chronic, POA  #Severe protein malnutrition, POA  #Severe muscle loss with cachexia due to severe malnutrition, POA  Previously evaluated by nutrition who had recommended that pt receive boost TID for assistance with malnutrition likely 2/2 poor PO intake from abdominal pain. Reports weight loss of approx 50lbs in the past year. Poor appetite 2/2 pain.   -Regular diet  -Discontinued fluids given adequate PO intake  -nutrition consult, appreciate recs  -nutrition supplements TID  -thiamine, vitamin D, vitamin B supplementation    #Normocytic anemia, Chronic POA  Pt presents with hgb of 9.8 c/w her baseline of 9-10. Query anemia 2/2 to nutritional deficincies from poor PO intake. From chart review, no recent vitamin or iron studies. Will order to further characterize. Denies SS of bleeding. Folate/B12 WNL. Iron studies c/f for element of IDA. Given current pain and not wanting to mask diarrhea, will hold on iron supplementation for now but consider when more clinically stable  -daily cbc  -consider iron supplementation as outpatient     #Tobacco use, POA  Previous PPD smoker from age 34 and reduced over last 7 years to now 1 cigarette all day. Requests nicotine patch while IP.   -  nicotine patch  -counseled on nicotine cessation     #Anxiety, chronic, POA  #Major Depression, chronic, POA  #Fibromyalgia, chronic, POA   #Insomnia, chronic, POA  Chronic conditions. Will continue home medications.   - Continue Seroquel 150mg  BID  - Increase duloxetine to 60 mg qday as above  - Trazodone 50 mg at bedtime PRN    #FEN/GI:   - Regular diet  - Bowel regimen:   - Miralax BID + additional spot tose   - senna BID   - bisacodyl suppository PRN    #Access: pIV    #Prophylaxis:  - Lovenox as above    #Code: Full Code    Stark Klein, MD who agrees with the above assessment and plan unless otherwise noted in addendum.    Author:  Burnell Blanks, MD 04/28/2023 8:08 AM       FM Attending Addendum:    I have seen and examined the patient, and discussed the plan of care with the resident. I am in agreement with the findings, assessments and treatment plans as stated in this document. Today patient endorses stable abdominal discomfort, moderately controlled with current pain regimen. Abdominal exam with normoactive BS, mild voluntary guarding diffusely but no involuntary guarding or rebound, stable from yesterday. Appreciate Pain Management and Vascular Surgery recommendations. Plan for transfer to [facility] for outpatient mesenteric angiogram with possible SMA stenting with Dr. Keene Breath tomorrow 04/29/23. Dispo pending improvement of pain and PO tolerance.     Debbe Mounts, MD

## 2023-04-28 NOTE — Other
Patient's Clinical Goal:   Clinical Goal(s) for the Shift: Pain mgmt, PO tolerance, BM, safe mobility  Identify possible barriers to advancing the care plan: None  Stability of the patient: Moderately Unstable - medium risk of patient condition declining or worsening    Progression of Patient's Clinical Goal:   Problem/Goal:  Abdominal pain, colitis, possible SMA stenosis    Response to Plan of Care and any changes in condition (clinical condition, any concerning assessments, events, interventions; response to POC effectiveness or changes made):   VS:   Stable, afebrile  PAIN:  Abdominal pain managed with tylenol, gabapentin and prn dilaudid and oxycodone   NEURO:   A&O x 4 with good affect  CARDIO:   NSR on cardiac monitor  RESP:   O2 sat 94-97% on RA  GI / GU:   BM this afternoon (loose/mucous - see photo in chart), poor appetite with c/o nausea this morning. Voiding with no issues  SKIN:   No issues  AMB:   BMAT 4, ambulatory in room  LINES/DRAINS:  Bilateral PIVs flushed and saline locked    Interdisciplinary communication (team member communication):   N/A  Psychosocial communication (family or patient issues potentially affecting care):   N/A  Plan and Disposition (progression toward specific goals of care/hospitalization and discharge):  NPO after midnight for vascular surgery at [facility] 5/9 (transport arranged - see SW note)  Continue pain management, follow up results of labs, imaging and surgery  DC pending medical stability

## 2023-04-28 NOTE — Other
Patient's Clinical Goal:   Clinical Goal(s) for the Shift: VSS, comfort & safety, pain control  Identify possible barriers to advancing the care plan: none  Stability of the patient: Moderately Unstable - medium risk of patient condition declining or worsening    Progression of Patient's Clinical Goal:     Problem/Goal:    Colitis, SMA stenosis  Response to Plan of Care and any changes in condition (clinical condition, any concerning assessments, events, interventions; response to POC effectiveness or changes made):   Pt A&Ox4, BMAT 4, RA, NSR, Cont O2 Sat, VSS  C/o 8/10 upper abdominal pain, PRN Dilaudid IVP and Oxycodone PO administered, relief noted  No BM overnight, MD requesting picture of BM  Abdomen distended and firm, KUB completed yesterday, large stool burden noted  Pt received evening Lovenox after initially refusing, educated on importance of anti-coagulation     Interdisciplinary communication (team member communication):   none  Psychosocial communication (family or patient issues potentially affecting care):   none  Plan and Disposition (progression toward specific goals of care/hospitalization and discharge):  Continue pain regimen  Pending angiogram 5/9 at [facility]  DCP: Home w/ no needs pending medical stability

## 2023-04-28 NOTE — Consults
NUTRITION ASSESSMENT (Adult)    Admit Date: 04/26/2023     Date of Birth: 04-24-1966 Gender: female MRN: [id number]     Date of Assessment: 04/28/2023    Reassessment   Indication: Significant weight loss per AND/ASPEN guidelines, Malnutrition   Subjective: Visited pt. Reported ongoing abd pain. Denied n/v at this time. Was able to eat breakfast and has been drinking Boost Breeze ONS. Offered snack list for pt order PRN. Pt pending angio procedure at [facility] tomorrow 5/9.   Problems: Principal Problem:    Colitis (POA: Yes)  Active Problems:    Generalized abdominal pain (POA: Yes)       Past Medical History:   Diagnosis Date    Anxiety     Depression     Emphysema, unspecified (HCC/RAF)     Emphysema, unspecified (HCC/RAF)     Fibromyalgia     Gastritis     GERD (gastroesophageal reflux disease)     History of blood transfusion     Intractable nausea and vomiting 01/23/2020    Pancreatitis     Past Surgical History:   Procedure Laterality Date    ABDOMINAL SURGERY      COLON SURGERY            Per H&P/MD notes: Donna Gross is a 57 y.o. female w/ PMHx pf anxiety, depression, emphysema, fibromyalgia, GERD, SMA stenosis who presents with intractable abdominal pain.     Patient reports that she has been feeling the same diffuse abdominal pain (acute on chronic), nausea/vomiting, and abdominal swelling that she presented with on  04/18 and has several times previous.      Pt reports she does not understand why they discharged her as none of her symptoms had improved Continues with persistent abdominal pain that is always present and will worsen. Notes some triggers include eating and Bms. Continues with nausea and nonbloody/nonbilious emesis, especially postprandially. Also continues with ''the runs.'' Reports loose stools with all bowel movements. States number of episodes varies on PO intake. Denies melena, hematochezia. Today states nothing she does at home helps her pain. The dilaudid she received has helped. Indicates she came to the hospital today because she has lost 4 more pounds since last discharge on 04/22     Data   Intake/Outputs: I/O last 2 completed shifts:  In: 356 [P.O.:240; I.V.:16; IV Piggyback:100]  Out: -      Pertinent Medications: acetaminophen, 1,000 mg, Oral, Q8H  atorvastatin, 40 mg, Oral, QHS  cholestyramine, 4 g, Oral, TID AC  DULoxetine, 60 mg, Oral, Daily  fluticasone-salmeterol, 1 puff, Inhalation, BID  gabapentin, 300 mg, Oral, TID  nicotine patch, 7 mg, Transdermal, Daily  pancrelipase (Lip-Prot-Amyl), 36,000 units of lipase, Oral, TID w/meals  pantoprazole, 40 mg, Oral, BID  polyethylene glycol, 17 g, Oral, BID  polyethylene glycol, 17 g, Oral, Once  QUEtiapine, 150 mg, Oral, BID  ranolazine, 500 mg, Oral, BID  senna, 2 tablet, Oral, QHS  thiamine, 100 mg, Oral, Daily  vitamin B complex, 1 capsule, Oral, Daily  vitamin D (cholecalciferol), 50 mcg, Oral, Daily    FDI Target Drugs: No      Pertinent Labs:   Recent Labs     04/28/23  0506   NA 137   K 4.9   BUN 14   CREAT 1.07   GLUCOSE 80   MG 1.7   CALCIUM 8.9   PHOS 4.7*   WBC 6.18   HGB 9.0*   HCT 28.9*  Trended Labs     01/28/23  0646   TRIGLY 93          Trended Labs     04/24/23  1555 04/24/23  0302 04/24/23  0301   VITD25OH  --   --  17*   VITAMINB1  --   --  117   VITAMINB12  --  271  --    FOLATE  --   --  21.8   FE  --  42  --    TIBC  --  381  --    FEBINDSAT  --  11  --    FERRITIN  --  27  --    ZINCSER 94.0  --   --        Accu-Chek: No results found in last 72 hours    Respiratory Status / O2 Device: None (Room air)    Pressure Injury: none recorded    Additional data:    No edema noted  Peripheral IV 20 G Left Antecubital (5)  Peripheral IV 20 G Anterior;Right Forearm (5)     Diet Info   Allergies:   Hydrocodone, Ibuprofen, and Morphine  Cultural/Ethnic/Religious/Other Food Preferences:  None     Nutrition prior to admit:  regular foods, no special diet indicated  Current diet order:     Diets/Supplements/Feeds   Diet    Diet NPO Reason for NPO: Procedure; Except for: Medications     Start Date/Time: 04/29/23 0010      Number of Occurrences: Until Specified     Order Questions:      Reason for NPO Procedure      Except for Medications    Diet regular     Start Date/Time: 04/27/23 0840      Number of Occurrences: Until Specified   Nourishments    Oral nutrition supplements Breakfast, Dinner, Lunch; Boost Breeze [place], Boost [employer]     Start Date/Time: 04/23/23 1830      Number of Occurrences: Until Specified     Order Questions:      Meal: Breakfast      Meal: Dinner      Meal: Lunch      Supplement: Boost Breeze [place]      Supplement: Boost Breeze [employer]     PO % consumed: 51 to 75%  Parenteral/Enteral Nutrition: none     Anthropometrics:   Height: 162.6 cm (5' 4.02'')  Admit Weight: 49.1 kg (108 lb 3.9 oz) (04/23/23 0656)  Current Weight: 49 kg (108 lb 0.4 oz)  BMI: 18.53  IBW: 54 kg/120 lbs  IBW %: 90  Adj BW (if applicable): 53 kg  UBW:    UBW %:         Weight History (last 5)  Weights Weight   03/29/2023   7:51 AM 50.7 kg   04/08/2023   7:03 AM 50.9 kg   04/11/2023   3:47 AM 50.9 kg   04/23/2023   6:56 AM 49.1 kg   04/23/2023   4:54 PM 49 kg       Estimated Nutrition Needs   Based on wt: 49 kg  Calories: 35 - 40 cal/kg = 1715 - 1960 cal/day  Protein: 1.5 - 2 g pro/kg = 73.5 - 98 g pro/day      Diet Education   No diet education needs at this time         Malnutrition Assessment using AND/ASPEN Consensus Criteria   Malnutrition in the  Context of: Mild-moderate inflammation/chronic disease   Energy Intake: Unable to assess  Weight Loss: Does not meet criterion - 23 lbs (18%) over the last year.     Nutrition-Focused Physical Exam: 04/24/2023  Subcutaneous Fat Loss: Mild  Areas examined/observed: Orbital Upper  Muscle Loss: Severe  Areas examined/observed: Temple, Clavicle, Deltoid       Patient meets criteria for: Severe protein calorie malnutrition     Nutrition Assessment    Anthropometrics: Patient's BMI is at the cutoff between normal and underweight given current weight and height. Of note, patient has lost 23 lbs over the last year. Muscle and fat wasting observed.     Nutrition: Patient reports following a regular diet at home. No apparent restrictions. However, pt reports pain after eating. Unclear how much pt was eating at home but probable <50%. Currently on regular diet + Boost Breeze TID. Fair intake, drinks ONS.     Tolerance/GI: Abdominal pain reported by pt; lasts throughout the whole day. Hx of loose stools. Last BM Date: 04/24/23 per RN flowsheet. KUB 5/7 showing large stool burden. On bowel regimen. Denied GI issues at this time aside from chronic abd pain. On creon.    Labs: Elevated Phos    Micronutrients:   04/2023: B1, B12, folate, zinc WNL. Vit D low    Skin: No pressure injuries      Recommendations / Care Plan      - Continue with diet as ordered: Regular  - Continue Boost Breeze TID   - Ok for pt to order snacks PRN, list provided    - PERT management per team. Provide with snacks as well as meals.    - Vitamin and mineral supplementation   - 100 mg thiamine PO    - B complex PO once daily    - 50 mcg cholecalciferol PO once daily     - Consider nutrition support due to hx of weight loss and malnutrition    PPN: D7% A4% 343 + 250 kcals @ 60 ml/hr     = 1073 kcals, 58 g protein and ~ 1 g/kg lipids/day     Initiate at half rate and advance as needed     EN: Isosource 1.5 @ 50 ml/hx x 24 hrs     = 1800 kcals and 82 g protein    -  Monitor I/O, daily labs, and biweekly weights as able        Author:  Vick Frees, RD Pager [phone]  04/28/2023 1:15 PM     RD to monitor and follow-up per nutrition protocol

## 2023-04-28 NOTE — Consults
CASE MANAGER ASSESSMENT      Admit ZOXW:[id number]    Date of Initial CM Assessment: 04/28/2023    Problems: Principal Problem:    Colitis (POA: Yes)  Active Problems:    Generalized abdominal pain (POA: Yes)       Past Medical History:   Diagnosis Date    Anxiety     Depression     Emphysema, unspecified (HCC/RAF)     Emphysema, unspecified (HCC/RAF)     Fibromyalgia     Gastritis     GERD (gastroesophageal reflux disease)     History of blood transfusion     Intractable nausea and vomiting 01/23/2020    Pancreatitis     Past Surgical History:   Procedure Laterality Date    ABDOMINAL SURGERY      COLON SURGERY            Primary Care Physician:Butler, Josem Kaufmann, MD  Phone:[phone]      NEEDS ASSESSMENT:     Level of Function Prior to Admit: Self Care/Indep. W ADLs    Primary Living Situation: Lives w/Family    Pre-admission Living Situation: Home/Apartment     Primary Support Systems: Children, Family members    Contact Name: [name] [name] (Daughter) ~ Jake Seats (Daughter) Phone Number: [phone] ([name]) ~ [phone] Mellody Drown)     Does the patient have a Family/Support System member participating in Discharge Planning?: Yes    DPOA?: Yes DPOA Type: Medical     Bathroom on Main Floor: Yes          Prior Treatments / Services: None    Who is your PCP?: Kelle Darting, MD    Do you have your Primary Care Doctor's office number?: Yes    How often do you visit your doctor?: Weekly    Do you need information/education regarding your medical condition?: No       Were you hospitalized in the last 30 days?: Yes      READMIT ASSESSMENT: (IF APPLICABLE)     Is this a planned readmission?: No       In your opinion, what brought you back to the hospital?: Change in medical condition   Did you receive your DC instructions from your previous DC?: Yes   Was there anything missing from or unclear with your discharge instructions?: No   Did you speak with your Doctor before coming to the hospital?: Yes   Are you able to take ALL your medications the way they have been prescribed?: Yes   Do you feel isolated/lonely?: No       FOLLOW UP APPT QUESTIONS: (IF APPLICABLE)     Was the follow up appt made before discharge?: No      RISK STRATIFICATION: (IF APPLICABLE)     > 2 admissions within the last 12 months?: Yes    Multiple co-morbidities?: No      DISCHARGE ASSESSMENT:     Projected Date of Discharge: 04/29/2023    Anticipated Complex D/C?: No    Projected Discharge to: Home    Discharge Address: [address]  [place] [address]    Projected Discharge Needs: None      Support Identified at Discharge: Child    Name of Discharge Support Person: [name] Eulah Pont (Daughter) ~ Jake Seats (Daughter) Phone Number: [phone] ([name]) ~ [phone] Mellody Drown)     Who is available to transport you upon discharge?: Need help Notify Social Worker of Patient's Transportation Options?: Yes  SDOH     Within the past 12 months, has a lack of transportation kept you from medical appointments, meetings, work, or from getting things needed for daily living? : No  How hard is it for you to pay for the very basics like food, housing, medical care, and heating?: Not very hard  How hard is it for you to pay for prescriptions or medical bills?: Not very hard  In the past 12 months has the electric, gas, oil, or water company threatened to shut off services in your home?: No  In the last 12 months, was there a time when you were not able to pay the mortgage or rent on time?: No     In the last 12 months, was there a time when you did not have a steady place to sleep or slept in a shelter (including now)?: No      Philippa Chester, RN,  04/28/2023  Hipolito Bayley L. Suezanne Jacquet, BSN, RN, CCM  Clinical Case Systems developer of Care Coordination and Clinical Social Work  [facility]  [address] [place], [place] [address]  Phone: [phone]  Pager: [phone]

## 2023-04-28 NOTE — Progress Notes
PATIENT:  Donna Gross  MRN:  [id number]  DOB:  1966/04/06  DATE OF SERVICE:  04/28/2023  .  ATTENDING PHYSICIAN: Debbe Mounts, MD   PRIMARY CARE PROVIDER: Kelle Darting, MD    Chief complaint:   Chief Complaint   Patient presents with    Abdominal Pain     Pain isnt getting any better I was admitted on the 4/18. I have a lot of pain when I eat. I also feel swollen.        BACKGROUND PAIN HISTORY:    H&P per Tami Lin on 04/23/23:  ''Donna Gross is a 57 y.o. female w/ PMHx pf anxiety, depression, emphysema, fibromyalgia, GERD, SMA stenosis who presents with intractable abdominal pain.     Patient reports that she has been feeling the same diffuse abdominal pain (acute on chronic), nausea/vomiting, and abdominal swelling that she presented with on  04/18 and has several times previous.      Pt reports she does not understand why they discharged her as none of her symptoms had improved Continues with persistent abdominal pain that is always present and will worsen. Notes some triggers include eating and Bms. Continues with nausea and nonbloody/nonbilious emesis, especially postprandially. Also continues with ''the runs.'' Reports loose stools with all bowel movements. States number of episodes varies on PO intake. Denies melena, hematochezia. Today states nothing she does at home helps her pain. The dilaudid she received has helped. Indicates she came to the hospital today because she has lost 4 more pounds since last discharge on 04/22. In total believes     Also denies fevers, chills, CP, SOB, sick contacts, dysuria, hematuria.      Recently admitted 04/18-04/22 for a similar presentation. During this hospitalization underwent CT ab/pelvis angiogram w/ and w/o contrast which demonstrated high grade stenosis of SMA. Vascular and IR consulted who did not feel strongly that this was the source of her abdominal pain and recommended against surgical interventions. She was started on anticoagulation with eliquis BID.''    We are consulted by the primary team (attending: Dr. Debbe Mounts, MD) for an opinion regarding the management of this patient's acute-on-chronic abdominal pain.    CURRENT PAIN PRESENTATION (last 24 hours):    Patient elicits that she is ''not doing too good.'' Although, she does elicit that the change made yesterday of making her Dilaudid IVP from q6h to q4h has helped control her pain significantly. She elicits gas and is wondering if something can be ordered for that. With regards to making changes to her pain medicine regimen, she elicits that the changes yesterday have helped her and she would like to stay the course today.     CURRENT PAIN REGIMEN (inpatient):    APAP 1000mg  q8h  Duloxetine 60mg  qd  Gabapentin 300mg  tid  Dilaudid 0.5mg  IVP q4h prn  Oxycodone 5/7.5mg  q4h prn    Home Regimen:    Duloxetine 30mg  qd  Percocet 7.5-325 qid    CURES report (last checked on 04/28/2023 ):      _________________________________    Past Medical History:   Diagnosis Date    Anxiety     Depression     Emphysema, unspecified (HCC/RAF)     Emphysema, unspecified (HCC/RAF)     Fibromyalgia     Gastritis     GERD (gastroesophageal reflux disease)     History of blood transfusion     Intractable nausea and vomiting 01/23/2020    Pancreatitis  Past Surgical History:   Procedure Laterality Date    ABDOMINAL SURGERY      COLON SURGERY            Family History   Problem Relation Age of Onset    Heart disease Mother     Diabetes Brother     Diabetes Father     Anesthesia problems Neg Hx     Malignant hypertension Neg Hx     Hypotension Neg Hx     Malignant hyperthermia Neg Hx     Pseudochol deficiency Neg Hx        Social History     Socioeconomic History    Marital status: Single   Tobacco Use    Smoking status: Every Day     Years: 20     Types: Cigarettes    Smokeless tobacco: Current    Tobacco comments:     1 cig per day   Substance and Sexual Activity    Alcohol use: No    Drug use: No   Social History Narrative Lives w/ mom and daughter     Social Determinants of Health     Financial Resource Strain: Low Risk  (04/12/2023)    Financial Resource Strain     Difficulty of Paying Living Expenses: Not very hard        Current Facility-Administered Medications   Medication Dose Route Frequency    acetaminophen tab 1,000 mg  1,000 mg Oral Q8H    albuterol 90 mcg/act inh 2 puff  2 puff Inhalation Q6H PRN    atorvastatin tab 40 mg  40 mg Oral QHS    bisacodyl supp 10 mg  10 mg Rectal Daily PRN    calcium carbonate chew tab 500 mg  500 mg Oral BID PRN    cholestyramine pwd packet 4 g  4 g Oral TID AC    DULoxetine DR cap 60 mg  60 mg Oral Daily    enoxaparin 60 mg/0.6 mL inj 50 mg  50 mg Subcutaneous BID    fluticasone-salmeterol 250-50 mcg/act diskus inhaler 1 puff  1 puff Inhalation BID    gabapentin cap 300 mg  300 mg Oral TID    [COMPLETED] HYDROmorphone 1 mg/mL inj 0.5 mg  0.5 mg IV Push Once    [COMPLETED] HYDROmorphone 1 mg/mL inj 0.5 mg  0.5 mg IV Push Once PRN    [COMPLETED] HYDROmorphone 1 mg/mL inj 0.5 mg  0.5 mg IV Push Once PRN    [COMPLETED] HYDROmorphone 1 mg/mL inj 0.5 mg  0.5 mg IV Push Once PRN    [COMPLETED] HYDROmorphone 1 mg/mL inj 0.5 mg  0.5 mg IV Push Once PRN    HYDROmorphone 1 mg/mL inj 0.5 mg  0.5 mg IV Push Q4H PRN    [COMPLETED] HYDROmorphone 1 mg/mL inj 1 mg  1 mg IV Push STAT    [COMPLETED] HYDROmorphone 1 mg/mL inj 1 mg  1 mg IV Push STAT    [COMPLETED] HYDROmorphone 1 mg/mL inj 1 mg  1 mg IV Push STAT    hyoscyamine tab 125 mcg  125 mcg Oral Q4H PRN    [COMPLETED] iohexol (Omnipaque) 350 mg/mL inj 100 mL  100 mL Intravenous Once    [COMPLETED] iohexol (Omnipaque) 350 mg/mL inj 100 mL  100 mL Intravenous Once    ipratropium-albuterol 20-100 mcg/act inh 2 puff  2 puff Inhalation QID PRN    [COMPLETED] lactated ringers IV soln bolus 1,000 mL  1,000 mL Intravenous  Once    [COMPLETED] lactated ringers IV soln bolus 1,000 mL  1,000 mL Intravenous Once    [COMPLETED] lactated ringers IV soln bolus 1,000 mL  1,000 mL Intravenous Once    [COMPLETED] lactated ringers IV soln bolus 1,000 mL  1,000 mL Intravenous Once    [COMPLETED] magnesium sulfate 2 g in water for injection 50 mL RTU  2 g Intravenous Once    [COMPLETED] magnesium sulfate 2 g in water for injection 50 mL RTU  2 g Intravenous Once    magnesium sulfate 2 g in water for injection 50 mL RTU  2 g Intravenous Once    [COMPLETED] magnesium sulfate 4 g in water for injection 100 mL RTU  4 g Intravenous Once    [COMPLETED] magnesium sulfate 4 g in water for injection 100 mL RTU  4 g Intravenous Once    naloxone 0.4 mg/mL inj 0.4 mg  0.4 mg IV Push PRN    nicotine 7 mg/24 hr patch 7 mg  7 mg Transdermal Daily    [COMPLETED] ondansetron 4 mg/2 mL inj 4 mg  4 mg Intravenous Once    ondansetron tab 4 mg  4 mg Oral Q6H PRN    oxyCODONE tab 5 mg  5 mg Oral Q4H PRN    oxyCODONE tab 7.5 mg  7.5 mg Oral Q4H PRN    pancrelipase (Lip-Prot-Amyl) (Creon) DR cap 36,000 units of lipase  36,000 units of lipase Oral TID w/meals    pantoprazole DR tab 40 mg  40 mg Oral BID    polyethylene glycol pwd pkt 17 g  17 g Oral BID    polyethylene glycol pwd pkt 17 g  17 g Oral Once    QUEtiapine tab 150 mg  150 mg Oral BID    ranolazine tab ER12 500 mg  500 mg Oral BID    senna tab 2 tablet  2 tablet Oral QHS    [COMPLETED] sodium chloride 0.9% IV soln bolus 1,000 mL  1,000 mL Intravenous Once    sodium chloride 0.9% IV soln  10 mL/hr Intravenous PRN    sodium chloride 0.9% IV soln  10 mL/hr Intravenous PRN    thiamine tab 100 mg  100 mg Oral Daily    traZODone tab 50 mg  50 mg Oral QHS PRN    vitamin B complex cap 1 capsule  1 capsule Oral Daily    vitamin D (cholecalciferol) tab 50 mcg  50 mcg Oral Daily    [DISCONTINUED] calcium carbonate chew tab 500 mg  500 mg Oral BID PRN    [DISCONTINUED] cyanocobalamin tab 1,000 mcg  1,000 mcg Oral Daily    [DISCONTINUED] DULoxetine DR cap 30 mg  30 mg Oral Every Other Day    [DISCONTINUED] DULoxetine DR cap 30 mg  30 mg Oral Daily [DISCONTINUED] DULoxetine DR cap 40 mg  40 mg Oral Daily    [DISCONTINUED] enoxaparin 40 mg/0.4 mL inj 40 mg  40 mg Subcutaneous BID    [DISCONTINUED] gabapentin cap 100 mg  100 mg Oral QHS    [DISCONTINUED] gabapentin cap 100 mg  100 mg Oral TID    [DISCONTINUED] gabapentin cap 200 mg  200 mg Oral TID    [DISCONTINUED] heparin 25,000 units in 0.45% NaCl 250 mL drip RTU  800 Units/hr Intravenous Continuous    [DISCONTINUED] heparin per pharmacy   Does not apply Continuous    [DISCONTINUED] HYDROmorphone 1 mg/mL inj 0.5 mg  0.5 mg IV Push Once    [  DISCONTINUED] HYDROmorphone 1 mg/mL inj 0.5 mg  0.5 mg IV Push Q6H PRN    [DISCONTINUED] HYDROmorphone 1 mg/mL inj 0.5 mg  0.5 mg IV Push Q6H PRN    [DISCONTINUED] HYDROmorphone 1 mg/mL inj 1 mg  1 mg IV Push Q4H PRN    [DISCONTINUED] lactated ringers IV soln bolus 1,000 mL  1,000 mL Intravenous Once    [DISCONTINUED] lactated ringers IV soln  90 mL/hr Intravenous Continuous    [DISCONTINUED] lactated ringers IV soln  75 mL/hr Intravenous Continuous    [DISCONTINUED] lactated ringers IV soln  45 mL/hr Intravenous Continuous    [DISCONTINUED] oxyCODONE tab 2.5 mg  2.5 mg Oral Q4H PRN    [DISCONTINUED] oxyCODONE tab 5 mg  5 mg Oral Q4H PRN    [DISCONTINUED] pantoprazole DR tab 40 mg  40 mg Oral Daily    [DISCONTINUED] polyethylene glycol pwd pkt 17 g  17 g Oral Daily    [DISCONTINUED] senna tab 1 tablet  1 tablet Oral QHS PRN    [DISCONTINUED] senna tab 1 tablet  1 tablet Oral QHS       Allergies    Hydrocodone, Ibuprofen, and Morphine      Review of Systems:    Patient positives noted below as checked:    General ROS []  Fatigue []  Chills []  Fever []  Other:       Psychological ROS []  Anxiety []  Depression []  Insomnia []  Other:      Ophthalmic ROS []  Blurry vision []  Decreased vision []  Double vision []  Other:      ENT ROS []  Vertigo []  Vision changes [] Vocal changes []  Other:      Allergy and Immunology ROS []  Nasal congestion []  Nasal discharge []  Postnasal drip []  Other: Hematological/Lymphatic ROS []  Bleeding problems []  Blood clots []  Bruising []  Other:      Endocrine ROS []  Mood swings []  Palpitations [] Polydipsia/polyuria []  Other:      Breast ROS []  Lumps []  Discharge []  Erythema []  Other:      Respiratory ROS []  Cough []  Shortness of breath []  Wheezing []  Other:      Cardiovascular ROS []  Chest pain []  Palpitations []  Dyspnea on exertion []  Other:      Gastrointestinal ROS [x]  Abdominal pain []  Change in bowel habits []  Black or bloody stools []  Other:      Genito-Urinary ROS []  Dysuria []  Trouble voiding []  Hematuria  []  Other:      Musculoskeletal ROS []  Joint erythema or swelling; pain in []  Back []  Neck []  Muscles []  Joints []  Other:      Neurological ROS []  Weakness []  Dizziness []  Fainting []  Seizures []  Tremors []  TIA/stroke symptoms []  Other:      Dermatological ROS []  Hair changes []  Nail changes []  Pruritus []  Other:           Objective:     Vitals: BP 91/73 (BP Location: Left arm, Patient Position: Sitting)  ~ Pulse 67  ~ Temp 36.4 ?C (97.5 ?F) (Oral)  ~ Resp 13  ~ Ht 5' 4.02'' (1.626 m)  ~ Wt 108 lb (49 kg)  ~ SpO2 96%  ~ BMI 18.53 kg/m?  Body mass index is 18.53 kg/m?Marland Kitchen       Constitutional: alert and  not in acute distress, eating breakfast appears comfortable  Mental Status: Alert and oriented to person, place, and time.  Recent and remote memory are intact.   Eyes: Orbits, eyelids, conjunctivae and sclera are normal in appearance.  ENT: mucous membranes moist  Cardiac: appears well perfused  Chest: non-labored  Abdomen: soft, tender, nondistended  Extremity: Examination of the bilateral joints are stable and range of motion is normal.  Neurologic: Cranial nerves II-XII grossly intact.     Labs:     Lab Results   Component Value Date    WBC 6.18 04/28/2023    HGB 9.0 (L) 04/28/2023    HCT 28.9 (L) 04/28/2023    MCV 85.8 04/28/2023    PLT 340 04/28/2023       Lab Results   Component Value Date    CREAT 1.07 04/28/2023    BUN 14 04/28/2023    NA 137 04/28/2023 K 4.9 04/28/2023    CL 105 04/28/2023    CO2 21 04/28/2023           Imaging:     XR KUB 1V PORTABLE on 04/27/2023     CLINICAL HISTORY: Constipation.     COMPARISON: CT abdomen/pelvis dated 04/23/2023.        IMPRESSION:     Normal bowel gas pattern.  Large stool burden.  No signs of pneumatosis or pneumoperitoneum.  Right upper quadrant surgical clips. Left upper quadrant anastomotic suture line.      CT ABD+PELVIS ANGIOGRAM WO+W CONTRAST on 04/23/2023     CLINICAL HISTORY: Recommended radiology repeat to further eval extravasation of contrast in ileum.     COMPARISON: CTA abdomen/pelvis dated 04/23/2023 at 11:06 a.m.     TECHNIQUE: On a multirow-detector CT scanner, a volumetric pre- and post-contrast scan was performed through the abdomen and pelvis in both the arterial and venous phases. Post processed CTA MIP reformatted images were provided.     CONTRAST: iohexol (Omnipaque) 350 mg/mL inj 100 mL     RADIATION DOSE: The patient received the following exposure event(s) during this study, and the dose reference values for each are as shown (CTDIvol in mGy, DLP in mGy-cm). Note that the values are not patient dose but numbers generated from scan   acquisition factors based on 32 cm (L) and/or 16 cm (S) phantoms and may substantially under-estimate or over-estimate actual patient dose based on patient size and other factors. PreMonitoring, CTDI(L): 0.8, DLP: 0.8;Monitoring, CTDI(L): 6, DLP:   6;Venous, CTDI(L): 10.2, DLP: 429.9;Abd/Pel wo, CTDI(L): 7.3, DLP: 307.6;Arterial, CTDI(L): 10.3, DLP: 432.2     FINDINGS:     VASCULAR FINDINGS:  Active extravasation: None.  Hematoma: None.  Atherosclerotic calcifications: Moderate.  Aorta: No aneurysm or dissection.  Celiac artery: Patent. Mild narrowing at the origin.  SMA: Patent with apparent serpiginous appearance of the terminal branch of the ileocolic artery (1-61). Significant calcified plaque with moderate to severe narrowing at its origin.    IMA: Patent.  Renal arteries: Moderate calcified plaque at the origin of the left renal artery with mild narrowing. Both vessels are patent.  SMV: Patent.  IMV: Patent.  IVC: Patent.     ADDITIONAL FINDINGS:  Lower chest: Emphysema and mild subpleural fibrosis and blebs. Bilateral bronchial and bronchiolar wall thickening and mild subpleural reticulation architectural distortion. Coronary artery calcification.  Liver: Normal density and contour. No solid mass.  Gallbladder and bile ducts: Surgically absent gallbladder with mildly dilated bile ducts, likely related to postcholecystectomy state.  Spleen: Unremarkable.  Pancreas: Mildly atrophic with unchanged mild dilation of the pancreatic duct, likely age-related.  Adrenals: Unremarkable.  Kidneys and ureters: Unremarkable.  Bowel: Status post partial gastrectomy and gastrojejunostomy. Small periampullary duodenal diverticulum. Nondilated bowel. Normal appendix. Unchanged mild wall  thickening and mucosal hyperenhancement of the ascending and proximal to mid transverse colon   with near resolution of the previously visualized contrast in the terminal ileum and cecum with trace residual in the cecum. No pneumatosis.  Reproductive organs: Normal uterus. No adnexal masses.  Bladder: Moderately distended with contrast.  Lymph nodes: Unremarkable.  Peritoneum: No free air, free fluid, or fluid collections.  Abdominal wall: Postsurgical changes in the anterior abdominal wall.  Bones: Degenerative spondylosis mostly at L5-S1 with anterolisthesis of L5 on S1.        IMPRESSION:     No active bleed. Near interval resolution of the previously visualized small volume contrast in the terminal ileum and cecum, which may have been transient hemorrhage due to right sided colitis given proximal colonic wall thickening and mucosal hyperenhancement and/or mild angiodysplasia given apparent serpiginous appearance of the terminal branch of the ileocolic artery. No bowel ischemia.      MEDICAL DECISION MAKING: Donna Gross is a 57 y.o. female with PMHx pf anxiety, depression, emphysema, fibromyalgia, GERD, SMA stenosis who presents with intractable abdominal pain. Likely component of pain is from SMA stenosis. Has been hospitalized many times for similar presentations with negative work-up aside from SMA stenosis. Admitted pending Vascular surgery management to address SMA syndrome. Consulted for intractable abdominal pain.     Patient elicits that she is satisfied with her current pain medicine regimen and that it has controlled her current pain. She wishes to make no changes today.     Recommendations:  Recommend continuing below multimodal pain regimen:  APAP 1000mg  q8h  Duloxetine 60mg  qd  Gabapentin 300mg  tid  Continue Dilaudid 0.5mg  IVP q4h prn  Continue Oxycodone 5/7.5mg  q4h prn  - Recommend holding parameters for opioids: RR<12 or sedation.  - [facility] Inpatient Consult for Acupuncture and Trigger Point Injection  as non-pharmacologic pain management to minimize pharmacologic pain medications and related adverse effects.  - Please contact Chronic Pain pager [phone] for questions.  Our recommendations for plan of care will be/were reviewed with the primary team.  This patient was seen and discussed with attending Dr. Raynald Kemp.  Author:  Rico Ala. Gardiner Coins, DO 04/28/2023 9:20 AM  I reviewed pertinent medical records, progress since last visit, and formulated recommendation for pain management. The recommendations were communicated with patient and primary care team that make decision to follow and order comprehensive pain medicine plan.     Dagoberto Reef, MD   Date: 04/28/2023  Time: 12:17 PM

## 2023-04-28 NOTE — Progress Notes
CLINICAL SOCIAL WORKER PROGRESS NOTE    Admit Date:04/26/2023    Date of CSW Assessment: 04/28/2023     REFERRAL INFORMATION:   Date Seen: 04/28/23  Date Referred: 04/28/23  Referral Source: MD, CSW  Reason for Referral: Transportation/Parking Assistance  Source of Information: Review of medical record, Consultation  Consult Source: MD, CSW  Does the patient have a Family/Support System member participating in Discharge Planning?: Yes  Family/Support System in agreement with the current discharge plan: Yes, in agreement and participating    CLINICAL ASSESSMENT:   Interventions: Coordinated care with multidisciplinary team, Provided concrete resource assistance, Provided follow up regarding, Provided referrals to, Provided support regarding forms/letters    Outcome: Per SW handoff, patient has procedure at [facility] tomorrow, and requires round trip NEMT. NEMT (gurney) was originally scheduled with [facility], however, gurney authorization with [facility] is still pending.         Writer completed request for NEMT via [employer] and reserved pick-up from [facility] for 8:30am and return pick-up from [facility] for 9:00pm, per consultation with MD.     Lesia Hausen Address: [facility] - [address] [place], [place] [address] (Procedure & Treatment Unit (PTU)), # [phone]).  Both pick-up and return trip confirmed with [employer] [phone], reserved by Ronnie Doss).       SW to complete fund request and forward funding to [employer] once approved.        Writer also coordinated with MD to complete [facility] PCS Form.   Writer faxed PCS Form to [facility] for authorization for gurney transport for future medical appointments.         SW to remain available as needed.    Plan/Recommendations: Disposition Pending    Projected Date of Discharge: 04/29/2023    Novella Olive, MSW,  04/28/2023

## 2023-04-29 ENCOUNTER — Ambulatory Visit: Payer: PRIVATE HEALTH INSURANCE

## 2023-04-29 DIAGNOSIS — K551 Chronic vascular disorders of intestine: Secondary | ICD-10-CM

## 2023-04-29 LAB — Basic Metabolic Panel
CALCIUM: 9.2 mg/dL (ref 8.6–10.4)
UREA NITROGEN: 12 mg/dL (ref 7–22)

## 2023-04-29 LAB — CBC: MEAN CORPUSCULAR VOLUME: 86.6 fL (ref 79.3–98.6)

## 2023-04-29 LAB — Activated Clotting Time, POC: ACTIVATED CLOTTING TIME, POCT: 245 s

## 2023-04-29 LAB — Phosphorus: PHOSPHORUS: 4.8 mg/dL — ABNORMAL HIGH (ref 2.3–4.4)

## 2023-04-29 LAB — Differential Automated: ABSOLUTE IMMATURE GRAN COUNT: 0.01 10*3/uL (ref 0.00–0.04)

## 2023-04-29 LAB — Magnesium: MAGNESIUM: 1.6 meq/L (ref 1.4–1.9)

## 2023-04-29 LAB — APTT: APTT: 34.9 s (ref 24.4–36.2)

## 2023-04-29 LAB — Prothrombin Time Panel: INR: 0.9 s (ref 11.5–14.4)

## 2023-04-29 MED ADMIN — POLYETHYLENE GLYCOL 3350 17 G PO PACK: 17 g | ORAL | @ 04:00:00 | Stop: 2023-05-18 | NDC 60687043199

## 2023-04-29 MED ADMIN — GABAPENTIN 300 MG PO CAPS: 300 mg | ORAL | @ 03:00:00 | Stop: 2023-05-01 | NDC 60687059111

## 2023-04-29 MED ADMIN — NICOTINE 7 MG/24HR TD PT24: 7 mg | TRANSDERMAL | @ 15:00:00 | Stop: 2023-05-01 | NDC 00536589488

## 2023-04-29 MED ADMIN — ATORVASTATIN CALCIUM 40 MG PO TABS: 40 mg | ORAL | @ 04:00:00 | Stop: 2023-05-01 | NDC 68084009911

## 2023-04-29 MED ADMIN — CHOLESTYRAMINE 4 G PO PACK: 4 g | ORAL | @ 01:00:00 | Stop: 2023-05-01

## 2023-04-29 MED ADMIN — KETAMINE HCL 10 MG/ML IJ SOLN: INTRAVENOUS | @ 23:00:00 | Stop: 2023-04-30 | NDC 42023011310

## 2023-04-29 MED ADMIN — PHENYLEPHRINE HCL (PRESSORS) 10 MG/ML IV SOLN: INTRAVENOUS | @ 23:00:00 | Stop: 2023-04-30 | NDC 25021031501

## 2023-04-29 MED ADMIN — PHENYLEPHRINE HCL 10 MG/ML IV SOLN (ANES): INTRAVENOUS | @ 23:00:00 | Stop: 2023-04-30

## 2023-04-29 MED ADMIN — CHOLESTYRAMINE 4 G PO PACK: 4 g | ORAL | @ 13:00:00 | Stop: 2023-05-23

## 2023-04-29 MED ADMIN — ACETAMINOPHEN 500 MG PO TABS: 1000 mg | ORAL | @ 07:00:00 | Stop: 2023-05-01 | NDC 00904673061

## 2023-04-29 MED ADMIN — ACETAMINOPHEN 500 MG PO TABS: 1000 mg | ORAL | @ 17:00:00 | Stop: 2023-05-01

## 2023-04-29 MED ADMIN — DEXAMETHASONE SODIUM PHOSPHATE 4 MG/ML IJ SOLN: INTRAVENOUS | @ 23:00:00 | Stop: 2023-04-30 | NDC 67457042312

## 2023-04-29 MED ADMIN — OXYCODONE HCL 5 MG PO TABS: 5 mg | ORAL | @ 03:00:00 | Stop: 2023-04-30 | NDC 68084035411

## 2023-04-29 MED ADMIN — HEPARIN SODIUM (PORCINE) 1000 UNIT/ML IJ SOLN: INTRAVENOUS | @ 23:00:00 | Stop: 2023-04-30 | NDC 00409272002

## 2023-04-29 MED ADMIN — OXYCODONE HCL 5 MG PO TABS: 7.5 mg | ORAL | @ 09:00:00 | Stop: 2023-05-02 | NDC 68084035411

## 2023-04-29 MED ADMIN — POLYETHYLENE GLYCOL 3350 17 G PO PACK: 17 g | ORAL | @ 17:00:00 | Stop: 2023-05-01

## 2023-04-29 MED ADMIN — SENNOSIDES 8.6 MG PO TABS: 2 | ORAL | @ 04:00:00 | Stop: 2023-05-01 | NDC 71399824503

## 2023-04-29 MED ADMIN — DULOXETINE HCL 60 MG PO CPEP: 60 mg | ORAL | @ 16:00:00 | Stop: 2023-05-01 | NDC 60687074511

## 2023-04-29 MED ADMIN — PANTOPRAZOLE SODIUM 40 MG PO TBEC: 40 mg | ORAL | @ 04:00:00 | Stop: 2023-05-04 | NDC 50268063911

## 2023-04-29 MED ADMIN — FLUTICASONE-SALMETEROL 250-50 MCG/ACT IN AEPB: 1 | RESPIRATORY_TRACT | @ 04:00:00 | Stop: 2023-05-01 | NDC 00378932132

## 2023-04-29 MED ADMIN — OXYCODONE HCL 5 MG PO TABS: 7.5 mg | ORAL | @ 15:00:00 | Stop: 2023-05-01 | NDC 68084035411

## 2023-04-29 MED ADMIN — GABAPENTIN 300 MG PO CAPS: 300 mg | ORAL | @ 13:00:00 | Stop: 2023-05-01 | NDC 60687059111

## 2023-04-29 MED ADMIN — THIAMINE HCL 100 MG PO TABS (MULTI GPI): 100 mg | ORAL | @ 17:00:00 | Stop: 2023-05-24 | NDC 77333093410

## 2023-04-29 MED ADMIN — FLUTICASONE-SALMETEROL 250-50 MCG/ACT IN AEPB: 1 | RESPIRATORY_TRACT | @ 17:00:00 | Stop: 2023-05-01

## 2023-04-29 MED ADMIN — PANCRELIPASE (LIP-PROT-AMYL) 36000-114000 UNITS PO CPEP: 36000 [IU] | ORAL | @ 01:00:00 | Stop: 2023-05-24

## 2023-04-29 MED ADMIN — PROPOFOL 200 MG/20ML IV EMUL: INTRAVENOUS | @ 23:00:00 | Stop: 2023-04-30 | NDC 63323026929

## 2023-04-29 MED ADMIN — HYDROMORPHONE HCL 1 MG/ML IJ SOLN: .5 mg | INTRAVENOUS | @ 04:00:00 | Stop: 2023-05-01

## 2023-04-29 MED ADMIN — PANCRELIPASE (LIP-PROT-AMYL) 36000-114000 UNITS PO CPEP: 36000 [IU] | ORAL | @ 15:00:00 | Stop: 2023-05-01

## 2023-04-29 MED ADMIN — FAMOTIDINE (PF) 20 MG/2ML IV SOLN: INTRAVENOUS | @ 23:00:00 | Stop: 2023-04-30 | NDC 67457043322

## 2023-04-29 MED ADMIN — HYDROMORPHONE HCL 1 MG/ML IJ SOLN: .5 mg | INTRAVENOUS | @ 04:00:00 | Stop: 2023-05-01 | NDC 00409128331

## 2023-04-29 MED ADMIN — HYDROMORPHONE HCL 1 MG/ML IJ SOLN: .5 mg | INTRAVENOUS | @ 14:00:00 | Stop: 2023-05-04 | NDC 00409128331

## 2023-04-29 MED ADMIN — PANTOPRAZOLE SODIUM 40 MG PO TBEC: 40 mg | ORAL | @ 15:00:00 | Stop: 2023-05-04 | NDC 50268063911

## 2023-04-29 MED ADMIN — LIDOCAINE HCL (PF) 1 % IJ SOLN: INTRAVENOUS | @ 23:00:00 | Stop: 2023-04-30 | NDC 63323049237

## 2023-04-29 MED ADMIN — RANOLAZINE ER 500 MG PO TB12: 500 mg | ORAL | @ 16:00:00 | Stop: 2023-05-01 | NDC 60687054911

## 2023-04-29 MED ADMIN — CEFAZOLIN SODIUM 1 G IJ SOLR: INTRAVENOUS | @ 23:00:00 | Stop: 2023-04-30 | NDC 60505614205

## 2023-04-29 MED ADMIN — HEPARIN SODIUM (PORCINE) 1000 UNIT/ML IJ SOLN: INTRAVENOUS | Stop: 2023-04-30 | NDC 00409272002

## 2023-04-29 MED ADMIN — ROCURONIUM BROMIDE 50 MG/5ML IV SOLN: INTRAVENOUS | @ 23:00:00 | Stop: 2023-04-30 | NDC 39822420002

## 2023-04-29 MED ADMIN — PLASMA-LYTE A IV SOLN: INTRAVENOUS | @ 23:00:00 | Stop: 2023-04-30 | NDC 00338022104

## 2023-04-29 MED ADMIN — CHOLECALCIFEROL 25 MCG (1000 UT) PO TABS: 50 ug | ORAL | @ 16:00:00 | Stop: 2023-05-24

## 2023-04-29 MED ADMIN — FENTANYL CITRATE (PF) 100 MCG/2ML IJ SOLN: INTRAVENOUS | @ 23:00:00 | Stop: 2023-04-30 | NDC 00409909422

## 2023-04-29 MED ADMIN — B COMPLEX PO CAPS: 1 | ORAL | @ 16:00:00 | Stop: 2023-05-01

## 2023-04-29 MED ADMIN — ZZ IMS TEMPLATE: 150 mg | ORAL | @ 04:00:00 | Stop: 2023-05-01 | NDC 60687034911

## 2023-04-29 MED ADMIN — RANOLAZINE ER 500 MG PO TB12: 500 mg | ORAL | @ 04:00:00 | Stop: 2023-05-01 | NDC 60687054911

## 2023-04-29 MED ADMIN — ZZ IMS TEMPLATE: 150 mg | ORAL | @ 16:00:00 | Stop: 2023-05-01 | NDC 60687034911

## 2023-04-29 MED ADMIN — ACETAMINOPHEN 10 MG/ML 50-100 ML INTRAVIA: 600 mg | INTRAVENOUS | @ 23:00:00 | Stop: 2023-04-29 | NDC 00264410090

## 2023-04-29 NOTE — Consults
Physical Therapy Evaluation      PATIENT: Donna Gross  MRN: [id number]  DOB: 05/02/1966    ADMIT DATE: 04/26/2023       Date of Evaluation: 04/28/2023    Problems: Principal Problem:    Colitis (POA: Yes)  Active Problems:    Generalized abdominal pain (POA: Yes)       Past Medical History:   Diagnosis Date    Anxiety     Depression     Emphysema, unspecified (HCC/RAF)     Emphysema, unspecified (HCC/RAF)     Fibromyalgia     Gastritis     GERD (gastroesophageal reflux disease)     History of blood transfusion     Intractable nausea and vomiting 01/23/2020    Pancreatitis     Past Surgical History:   Procedure Laterality Date    ABDOMINAL SURGERY      COLON SURGERY          Relevant Hospital Course: Pt is a 57 y/o F with PMH of anxiety, depression, emphysema, fibromyalgia, SMA stenosis who p/w intractable abdominal pain. CT abdomen/pelvis shows extravasation in cecum and ileum concerning for possible worsening ischemia. Repeat CT abdomen/pelvis demonstrated no increase in extravasation or concern for acute bleed. Pt is now pending angiogram to be done on 5/9 at [facility].     Patient Stated Goal: stop having abdominal problem; go home     Living Arrangements   Type of Home: Apartment  Home Layout: One level, Stairs to enter with rails  # Stairs to enter: 2  Bathroom Shower/Tub: Pension scheme manager: Midwife: None  Home Equipment: 4-wheeled walker    Prior Level of Function   Level of Independence: Independent, 4-wheeled walker, Limited community distances (1/2 block, intermittently uses 4WW inside/outside home pending energy level)  Lives With: Daughter, Parent(s) (Mother)  Support Available: Family  # of hours available: Mother, daughter available prn  ADL Assistance: Independent, Activities of Daily Living, Instrumental Activities of Daily Living (Pt has ''bad day'' and req's assist for ADLs/IADLs, but normally able to do independently- one task at a time.)  Homemaking Assistance: Independent  Vocation: On disability  Vision: Glasses, Reading, Distance  Hearing: Within Education administrator: Driven by Others, Public    Precautions   Precautions: Dance movement psychotherapist;Check Labs  Orthotic: None  Current Activity Order: Activity as tolerated;Ambulate (patient)  Weight Bearing Status: Not Applicable  Additional Weight Bearing Status: Not Applicable    GENERAL EVALUATION   Position: In bed  Lines/devices Drains: IV/PICC;Cardiac Monitor;Pulse Ox     Bed Mobility   Supine Scooting: Supervised  Rolling: Not Performed  Supine to Sit: Stand by Assist;to Right  Sit to Supine: Stand by Assist;to Left    Functional Mobility   Sit to Stand: Contact Guard Assist (brief stand with no AD; min unsteady)  Ambulation: Not Performed;Declined (due to fatigue and overall not feeling well)  Wheelchair: Not Applicable  Stairs: Not Performed (TBA)     LE Assessment   RLE Assessment: Gross Assessment  grossly 4+/5              LLE Assessment: Gross Assessment  grossly 4+/5              Sensation   Sensation: Grossly intact    Cognition   Cognition: Within Defined Limits  Safety Awareness: Good awareness of safety precautions  Barriers to Learning: None       Pain Assessment   Patient complains  of pain: Yes  Pain Quality: Aching  Pain Scale Used: Numeric Pain Scale  Pain Intensity: Patient unable to rate  Pain Location: Abdomen  Action Taken: Nursing notified;Pain mgmt education                                  Patient Status   Activity Tolerance: Fair  Oxygen Needs: Room Air  Response to Treatment: Vital signs stable;Fatigued;Pain;with activity;Resolved with rest;Nursing notified  Compliance with Precautions: Not Applicable  Call light in reach: Yes  Presentation post treatment: In bed;Lines/drains intact;Bed alarm on;On cardiac monitor;Pulse Ox  Comments: Chart reviewed and RN cleared pt for PT. Pt was seen with OT for cotx and safety. Pt with limited tolerance to activity due to overall not feeling well; reports ''today is a bad day.'' Pt able to tolerate seated EOB for ~15 minutes with getting PLOF and assessing generalized strength prior to request to return BTB. Recommend assess transfers and gait training with FWW as able. Pt will benefit from skilled PT services to improve strength, endurance, and overall fucntional mobility.    Interdisciplinary Communication   Interdisciplinary Communication: Nurse;Occupational Therapist      ASSESSMENT   Rehab Potential: Good  Inpatient Recommendation: PT treatment  Problem List: Decreased gait;Decreased transfers;Stairs;Decreased strength;Decreased endurance;Pain limiting function;Fall risk;Impaired balance;Decreased activity tolerance  Treatment Plan: Transfer training;Gait training;Stair training;Therapeutic exercise;Home program;Patient and/or family education;Coordinate with nurse to pre-medicate patient as needed  Frequency: 3-5 x/week  Duration (days): 30  Progress Note Due Date: 05/05/23    Goals Discussed With: Patient    Short Term Goals to be achieved in: 7 days  Pt will perform sit to stand: with stand by assist;with FWW  Pt will ambulate: 51-100 feet;with FWW;with stand by assist  Pt will go up/down stairs: 1-2 stairs;with contact guard assist    Long Term Goals to be achieved in: 30 days  Pt will ambulate: > 200 feet;with FWW;with supervision    PT Recommendations   Discharge Recommendation: Would benefit from continued therapy;Physical Therapy  Discharge Equipment Recommended: To be assessed further;Owns recommended DME  Comments: anticipate HHPT upon DC home  Evaluation Completed by: Garner Rullo, PT,  04/28/2023

## 2023-04-29 NOTE — Other
Patient's Clinical Goal:   Clinical Goal(s) for the Shift: VSS, Safety, Pain Mgnt, NPO    Identify possible barriers to advancing the care plan: none    Stability of the patient: Moderately Stable - low risk of patient condition declining or worsening     Progression of Patient's Clinical Goal:     Problem/Goal:   Colitis  Response to Plan of Care and any changes in condition (clinical condition, any concerning assessments, events, interventions; response to POC effectiveness or changes made):   VSS, Afebrile, Aox4  Cardiac monitored (NSR), Continuous pulse ox, SPO2 95%, Room air.  C/o abd pain (epigastric) 7-9/10, Tylenol 1000mg , Oxycodone 5mg  and 7.5mg  given. Pt tolerated well.  Pt NPO after mid-night.  Pt administered all medications as ordered.  Poor appetite observed.  LBM 04/29/23 per SeanPaul RN.    Interdisciplinary communication (team member communication):   MD paged for renewed Tele monitoring order    Psychosocial communication (family or patient issues potentially affecting care):   none    Plan and Disposition (progression toward specific goals of care/hospitalization and discharge):  Continue pain mgnt  NPO for procedure @ [facility]  D/c pt when medically stable

## 2023-04-29 NOTE — H&P
UPDATED H&P REQUIREMENT    For [facility] and [name]    WHAT IS THE STATUS OF THE PATIENT'S MOST CURRENT HISTORY AND PHYSICAL?   - The most current H&P is >24 hours and <30 days, and having examined the patient, I attest that there have been no changes. (This suffices as an update to the H&P).      REFER TO MEDICAL STAFF POLICIES REGARDING PRE-PROCEDURE HISTORY AND PHYSICAL EXAMINATION AND UPDATED H&P REQUIREMENTS BELOW:    [facility] and [name] Medical Staff Policy 200 - For Patients Undergoing Procedures Requiring Moderate or Deep Sedation, General Anesthesia or Regional Anesthesia    Contents of a History and Physical Examination (H&P):    The H&P shall consist of chief complaint, history of present illness, allergies and medications, relevant social and family history, past medical history, review of systems and physical examination, and assessment and plan appropriate to the patient's age.    For Patients Undergoing Procedures Requiring Moderate or Deep Sedation, General Anesthesia or Regional Anesthesia:    1. An H&P shall be performed within 24 hours prior to the procedure by a qualified member of the medical staff or designee with appropriate privileges, except as noted in item 2 below.    2. If a complete history and physical was performed within thirty (30) calendar days prior to the patient's admission to the Medical Center for elective surgery, a member of the medical staff assumes the responsibility for the accuracy of the clinical information and will need to document in the medical record within twenty-four (24) hours of admission and prior to surgery or major invasive procedure, that they either attest that the history and physical has been reviewed and accepted, or document an update of the original history and physical relevant to the patient's current  clinical status.    3. Providing an H&P for patients undergoing surgery under local anesthesia is at the discretion of the Attending Physician.     4. When a procedure is performed by a dentist, podiatrist or other practitioner who is not privileged to perform an H&P, the anesthesiologist's assessment immediately prior to the procedure will constitute the 24 hour re-assessment.The dentist, podiatrist or other practitioner who is not privileged to perform an H&P will document the history and physical relevant to the procedure.    5. If the H&P and the written informed consent for the surgery or procedure are not recorded in the patient's medical record prior to surgery, the operation shall not be performed unless the attending physician states in writing that such a delay could lead to an adverse event or irreversible damage to the patient.    6. The above requirements shall not preclude the rendering of emergency medical or surgical care to a patient in dire circumstances.

## 2023-04-29 NOTE — Nursing Note
0800: A/Ox4. Reports a decrease in pain level of 6/10 after oxycodone PO given. No acute or respiratory distress. SR on cardiac monitor. Saturation 97% on RA. NPO except medications. Ambulatory with steady gait. Call light within reach. Will continue to monitor.     2841: Report given to Caryn Bee in PTU. Pt left the unit with EMTs via gurney.     1900: Pt remains off the unit in [facility].

## 2023-04-29 NOTE — Progress Notes
VASCULAR SURGERY CONSULTATION - PROGRESS NOTE    PATIENT: Donna Gross  MRN: [id number]  DOB: 25-Aug-1966  DATE OF SERVICE: 04/29/2023     PRIMARY CARE PROVIDER: Kelle Darting, MD    Reason for Consultation: Abdominal Pain     Subjective:     History of Present Illness:  Donna Gross is a 57 y.o. female w/ PMHx pf anxiety, depression, emphysema, active smoker (1 cig per day), fibromyalgia, GERD, SMA stenosis who presents to the ED with an acute abdominal pain on a chronic abdominal pain.   The patient has a known SMA stenosis with chronic symptoms (~1 yr) of post-prandial emesis (1-2x per week) of undigested food, abdominal discomfort, and post-prandial diarrhea. Patient reports symptoms began 1 year ago ''after surgery''. 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 Most recent operation was lap chole in 03/2022 c/b subhepatic abscess requiring IR drainage and bile leak requiring ERCP/CBD stent.     She was recently admitted 04/18-04/22 for a similar presentation. During this hospitalization underwent CT ab/pelvis angiogram w/ and w/o contrast which demonstrated high grade stenosis of SMA.     IR and  consulted and did not feel strongly that this was the source of her abdominal pain and recommended against a stenting revascularization as the SMA narrowing is similar to prior studies. Given normal size and patency of the IMA and CA, IR was convinced the SMA narrowing is responsible for the patient's abdominal pain, and therefore stenting would not be beneficial.   The [place] abdo performed on 04/08/2023:   Patent celiac artery and superior mesenteric artery with borderline, approximately 50%, stenosis of the superior mesenteric artery at the origin.     She was started on anticoagulation with eliquis BID during the last hospitalization.    Patient came to the ED today as she reported having an acute diffuse abdominal pain today worse than her usual pain, Had 2 episodes of non-bloody emesis yesterday, and loose stools this morning.   Patients also reports that she is feeling that her belly is swollen.   Denies melena, hematochezia.   Also denies fevers, chills, CP, SOB, sick contacts, dysuria, hematuria.       04/24/2023: AFVSS, pt is still complaining of the same pain. Abdomen still soft and NT today. Is tolerating food. BM *2, passing gas.  04/25/2023: AFVSS. pt is still complaining of the same pain. Better PO intake 440/24H. Passing gas, BM+.  04/26/2023: AFVSS. Patient is stable regarding the pain. PO intake 290 cc. Passing gas, BM-.  ...  04/29/2023: AFVSS. No change in exam or pain. Tolerating PO intake although NPO starting at midnight in anticipation of procedure today. Plan for angio at [facility] today.    Past Medical History:   Past Medical History:   Diagnosis Date    Anxiety     Depression     Emphysema, unspecified (HCC/RAF)     Emphysema, unspecified (HCC/RAF)     Fibromyalgia     Gastritis     GERD (gastroesophageal reflux disease)     History of blood transfusion     Intractable nausea and vomiting 01/23/2020    Pancreatitis        Past Surgical History:   Past Surgical History:   Procedure Laterality Date    ABDOMINAL SURGERY  COLON SURGERY         Scheduled Meds:  Current Facility-Administered Medications   Medication Dose Route Frequency    acetaminophen tab 1,000 mg  1,000 mg Oral Q8H    albuterol 90 mcg/act inh 2 puff  2 puff Inhalation Q6H PRN    atorvastatin tab 40 mg  40 mg Oral QHS    bisacodyl supp 10 mg  10 mg Rectal Daily PRN    calcium carbonate chew tab 500 mg  500 mg Oral BID PRN    cholestyramine pwd packet 4 g  4 g Oral TID AC    DULoxetine DR cap 60 mg  60 mg Oral Daily    [COMPLETED] enoxaparin 60 mg/0.6 mL inj 50 mg  50 mg Subcutaneous BID    fluticasone-salmeterol 250-50 mcg/act diskus inhaler 1 puff  1 puff Inhalation BID    gabapentin cap 300 mg  300 mg Oral TID    HYDROmorphone 1 mg/mL inj 0.5 mg  0.5 mg IV Push Q4H PRN    hyoscyamine tab 125 mcg  125 mcg Oral Q4H PRN    ipratropium-albuterol 20-100 mcg/act inh 2 puff  2 puff Inhalation QID PRN    [COMPLETED] magnesium sulfate 2 g in water for injection 50 mL RTU  2 g Intravenous Once    naloxone 0.4 mg/mL inj 0.4 mg  0.4 mg IV Push PRN    nicotine 7 mg/24 hr patch 7 mg  7 mg Transdermal Daily    ondansetron tab 4 mg  4 mg Oral Q6H PRN    oxyCODONE tab 5 mg  5 mg Oral Q4H PRN    oxyCODONE tab 7.5 mg  7.5 mg Oral Q4H PRN    pancrelipase (Lip-Prot-Amyl) (Creon) DR cap 36,000 units of lipase  36,000 units of lipase Oral TID w/meals    pantoprazole DR tab 40 mg  40 mg Oral BID    polyethylene glycol pwd pkt 17 g  17 g Oral BID    [COMPLETED] polyethylene glycol pwd pkt 17 g  17 g Oral Once    QUEtiapine tab 150 mg  150 mg Oral BID    ranolazine tab ER12 500 mg  500 mg Oral BID    senna tab 2 tablet  2 tablet Oral QHS    sodium chloride 0.9% IV soln  10 mL/hr Intravenous PRN    sodium chloride 0.9% IV soln  10 mL/hr Intravenous PRN    thiamine tab 100 mg  100 mg Oral Daily    traZODone tab 50 mg  50 mg Oral QHS PRN    vitamin B complex cap 1 capsule  1 capsule Oral Daily    vitamin D (cholecalciferol) tab 50 mcg  50 mcg Oral Daily    [DISCONTINUED] senna tab 1 tablet  1 tablet Oral QHS       Allergies: Hydrocodone, Ibuprofen, and Morphine    Family History:   Family History   Problem Relation Age of Onset    Heart disease Mother     Diabetes Brother     Diabetes Father     Anesthesia problems Neg Hx     Malignant hypertension Neg Hx     Hypotension Neg Hx     Malignant hyperthermia Neg Hx     Pseudochol deficiency Neg Hx        Social History:   Social History     Socioeconomic History    Marital status: Single   Tobacco Use    Smoking  status: Every Day     Years: 20     Types: Cigarettes    Smokeless tobacco: Current    Tobacco comments:     1 cig per day   Substance and Sexual Activity    Alcohol use: No    Drug use: No   Social History Narrative    Lives w/ mom and daughter     Social Determinants of Health     Financial Resource Strain: Low Risk  (04/28/2023)    Financial Resource Strain     Difficulty of Paying Living Expenses: Not very hard       Objective:     Physical Exam:  Last Recorded Vital Signs:    04/29/23 0803   BP: 140/92   Pulse: 71   Resp: 21   Temp: 36.6 ?C (97.9 ?F)   SpO2: 97%     Body mass index is 18.53 kg/m?Marland Kitchen   Vitals:    04/23/23 1654   Height: 1.626 m (5' 4.02'')       Vitals:    04/23/23 1654   Height: 1.626 m (5' 4.02'')     General: The pateint is a well-appearing, well-nourished, in no acute distress.The patient is awake, alert, oriented, and answers questions appropriately.   Head: Normocephalic, atraumatic.   Eyes: Extraocular movements are intact. There is no scleral icterus.   Heart: SR  Chest:  No use of accessory muscle  Abdomen: Soft and non-tender.  Extremities:  No clubbing, cyanosis, or edema.    Laboratory Review:  Lab Results   Component Value Date    WBC 5.69 04/29/2023    HGB 8.6 (L) 04/29/2023    HCT 28.5 (L) 04/29/2023    MCV 86.6 04/29/2023    PLT 366 04/29/2023     Recent Results (from the past 24 hour(s))   Prothrombin Time Panel    Collection Time: 04/29/23  6:45 AM   Result Value Ref Range    Prothrombin Time 12.6 11.5 - 14.4 seconds    INR 0.9 .   APTT    Collection Time: 04/29/23  6:45 AM   Result Value Ref Range    APTT 34.9 24.4 - 36.2 seconds   CBC    Collection Time: 04/29/23  6:45 AM   Result Value Ref Range    White Blood Cell Count 5.69 4.16 - 9.95 x10E3/uL    Red Blood Cell Count 3.29 (L) 3.96 - 5.09 x10E6/uL    Hemoglobin 8.6 (L) 11.6 - 15.2 g/dL    Hematocrit 84.6 (L) 34.9 - 45.2 %    Mean Corpuscular Volume 86.6 79.3 - 98.6 fL    Mean Corpuscular Hemoglobin 26.1 (L) 26.4 - 33.4 pg    MCH Concentration 30.2 (L) 31.5 - 35.5 g/dL    Red Cell Distribution Width-SD 51.8 (H) 36.9 - 48.3 fL    Red Cell Distribution Width-CV 16.4 (H) 11.1 - 15.5 %    Platelet Count, Auto 366 143 - 398 x10E3/uL    Mean Platelet Volume 9.2 (L) 9.3 - 13.0 fL    Nucleated RBC%, automated 0.0 No Ref. Range %    Absolute Nucleated RBC Count 0.00 0.00 - 0.00 x10E3/uL    Neutrophil Abs (Prelim) 2.78 See Absolute Neut Ct. x10E3/uL   Differential, Automated    Collection Time: 04/29/23  6:45 AM   Result Value Ref Range    Neutrophil Percent, Auto 48.7 No Ref. Range %    Lymphocyte Percent, Auto 38.7 No Ref. Range %  Monocyte Percent, Auto 9.5 No Ref. Range %    Eosinophil Percent, Auto 2.5 No Ref. Range %    Basophil Percent, Auto 0.4 No Ref. Range %    Immature Granulocytes% 0.2 No Reference Range %    Absolute Neut Count 2.78 1.80 - 6.90 x10E3/uL    Absolute Lymphocyte Count 2.20 1.30 - 3.40 x10E3/uL    Absolute Mono Count 0.54 0.20 - 0.80 x10E3/uL    Absolute Eos Count 0.14 0.00 - 0.50 x10E3/uL    Absolute Baso Count 0.02 0.00 - 0.10 x10E3/uL    Absolute Immature Gran Count 0.01 0.00 - 0.04 x10E3/uL       Imaging Review:   Repeat CT scan after the first one 04/23/2023, at 05:30PM to r/o a colonic  bleeding  IMPRESSION:  No active bleed. Near interval resolution of the previously visualized small volume contrast in the terminal ileum and cecum, which may have been transient hemorrhage due to right sided colitis given proximal colonic wall thickening and mucosal   hyperenhancement and/or mild angiodysplasia given apparent serpiginous appearance of the terminal branch of the ileocolic artery. No bowel ischemia.   Signed by: Verdell Carmine   04/23/2023 5:53 PM    XR kub portable (1 view)    Result Date: 04/27/2023  IMPRESSION: Normal bowel gas pattern. Large stool burden. No signs of pneumatosis or pneumoperitoneum. Right upper quadrant surgical clips. Left upper quadrant anastomotic suture line. Signed by: Alexia Freestone   04/27/2023 1:12 PM    CT abd+pelvis angiogram wo+w contrast    Result Date: 04/23/2023  IMPRESSION: No active bleed. Near interval resolution of the previously visualized small volume contrast in the terminal ileum and cecum, which may have been transient hemorrhage due to right sided colitis given proximal colonic wall thickening and mucosal hyperenhancement and/or mild angiodysplasia given apparent serpiginous appearance of the terminal branch of the ileocolic artery. No bowel ischemia.  Signed by: Verdell Carmine   04/23/2023 5:53 PM    CT abd+pelvis angiogram wo+w contrast    Result Date: 04/23/2023  IMPRESSION: 1. 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 Arteries beyond their origin are patent. 2. Circumferential wall thickening in the ascending and proximal transverse colon, suspicious for colitis which may be ischemic or infectious in etiology. 3. High density foci at the inferior cecum and terminal ileal loops, seen on venous phase imaging and not included in the field-of-view on the noncontrast and arterial phase scans, suspicious for extravasation at the cecum with retrograde flow into distal ileal loops versus extravasation of the distal ileal loops with some antegrade flow. Consider follow-up CT in 6 to 8 hours and/or IR consultation. These results were discussed with [facility] R. LANDEFELD at 04/23/2023 12:51 PM. Signed by: Albin Fischer   04/23/2023 12:54 PM    US duplex abd limited portable    Result Date: 04/08/2023  IMPRESSION: Patent celiac artery and superior mesenteric artery with borderline, approximately 50%, stenosis of the superior mesenteric artery at the origin. I, Verdell Carmine, M.D., have reviewed this radiological study personally and I am in full agreement with the findings of the report presented here. Signed by: Verdell Carmine   04/08/2023 11:14 PM    CT abd+pelvis angiogram wo+w contrast    Result Date: 04/08/2023  IMPRESSION: High-grade stenosis of the origin of superior mesenteric artery. Clinically correlate for mesenteric angina. No direct CT findings of ischemic enteritis or colitis. Signed by:  Carie Caddy   04/08/2023 10:14 AM   Diagnosis:   1. SMA stenosis     Assessment/Plan:     Donna Gross is a 57 y.o. female with known SMA stenosis and symptoms consistent with chronic mesenteric ischemia presenting with acute abdominal pain on a chronic abdominal pain.   Unclear whether this is acute vs. Chronic and whether her symptoms are primarily of vascular etiology.     - No elements in favor of a bowel ischemia (vitals normal, physical exam not in favor, WBC 6.87, Lactate 10). 2nd CT scan not in favor of a bowel ischemia. Not in favor of a bleeding.     All the patient's questions that arose during the visit were answered to the apparent satisfaction of the patient.    Plan:   - plan for angio at [facility] today  - NPO for procedure  - Rest of care per primary team.    Author: Saunders Glance. Joanie Coddington, MD 04/29/2023 8:29 AM  General Surgery  [facility]    Patient seen and examined by and plan developed with the attending physician, Dr. Keene Breath.

## 2023-04-29 NOTE — Consults
IP CM ACTIVE DISCHARGE PLANNING  Department of Care Coordination      Admit ZOXW:[id number]  Anticipated Date of Discharge: 05/03/2023    Following UJ:WJXB, Synetta Fail, MD      Today's short update     Per discussion with IDR pt is medically active at this time. Home Helath orders in place. tasked via Lois Huxley, [place] is available to service.    Disposition     Home with Home Health  [address] [place] [address]  Family/Support System in agreement with the current discharge plan: Yes, in agreement and participating    Freedom of Choice     Treatment Preferences: Addressed  Discharge Goals: Addressed  Freedom of Choice: Educated and Provided, Home Health Agency           Aidin Choice List: Provided to Pt/Family  Date Provided: 04/29/23       Home Health Coordination Status (if applicable)     Agency accepted (6/6)            Emerson Monte,  04/29/2023

## 2023-04-29 NOTE — Progress Notes
Family Medicine Progress Note    Primary Care Provider: Kelle Darting, MD  Attending Physician: Donna Mounts, MD  Senior Resident Physician: Donna Stains, MD  Junior Resident Physician: Donna Allegra. Steffanie Dunn, MD    Chief complaint: Abdominal pain    Subjective:     Overnight events:   - NAONE  -NPO at 0000    This AM:  -plan for trip to [facility] today for angio with vascular surgery procedure, transport to arrive 0830  -Reports continued abdominal pain, helped by IV, PO opiates  -Planned for cath lab at 11 AM  -Denies f/c, n/v/d, SOB, CP      PRNs: Oxycodone 7.5mg  x5, Dilaudid 0.5 mg IV x4      Medications:     Continuous Medications:       Scheduled Medications:     acetaminophen  1,000 mg Oral Q8H    atorvastatin  40 mg Oral QHS    cholestyramine  4 g Oral TID AC    DULoxetine  60 mg Oral Daily    fluticasone-salmeterol  1 puff Inhalation BID    gabapentin  300 mg Oral TID    nicotine patch  7 mg Transdermal Daily    pancrelipase (Lip-Prot-Amyl)  36,000 units of lipase Oral TID w/meals    pantoprazole  40 mg Oral BID    polyethylene glycol  17 g Oral BID    QUEtiapine  150 mg Oral BID    ranolazine  500 mg Oral BID    senna  2 tablet Oral QHS    thiamine  100 mg Oral Daily    vitamin B complex  1 capsule Oral Daily    vitamin D (cholecalciferol)  50 mcg Oral Daily     PRN Medications: albuterol, bisacodyl, calcium carbonate, HYDROmorphone, hyoscyamine, ipratropium-albuterol, naloxone, ondansetron, oxyCODONE, oxyCODONE, sodium chloride, sodium chloride, traZODone      Physical Exam:     Vital signs in last 24 hours:   Temp:  [36.4 ?C (97.5 ?F)-36.8 ?C (98.3 ?F)] 36.8 ?C (98.3 ?F)  Heart Rate:  [64-84] 64  Resp:  [13-23] 14  BP: (91-136)/(57-87) 101/63  NBP Mean:  [67-98] 75  SpO2:  [92 %-97 %] 92 %     I/O last 2 completed shifts:  In: 170 [P.O.:120; IV Piggyback:50]  Out: 1 [Urine:1]  BMs: none charted    General: Alert, comfortable, NAD  Head: Atraumatic, normocephalic  Eyes: PERRL. EOM intact. Sclera are anincteric and noninjected.  Mouth/Throat: Mucous membranes moist  Cardiac: RRR, nl S1/S2, no m/r/g, no thrills on palpation.   Lungs: Respiratory effort nl, CTAB, no crackles or wheezes  Abdomen: Normoactive bowel sounds. No masses or organomegaly appreciated though exam limited by pain. Voluntary guarding with palpation, no rebound. Diffusely TTP. Multiple abdominal surgery scars.   Extr: No lower extremity edema, warm and well-perfused.  Skin: Warm and dry. No significant rashes or lesions noted.  Neuro: A&O x4/4.   Psych: Normal mood and affect      Laboratory Data:     Recent Labs     04/28/23  0506   WBC 6.18   HGB 9.0*   HCT 28.9*   MCV 85.8   PLT 340   NEUTPCT 60.0   NA 137   K 4.9   CL 105   CO2 21   BUN 14   CREAT 1.07   MG 1.7   CALCIUM 8.9       No results for input(s): ''ALT'', ''AST'', ''BILITOT'', ''ALKPHOS'', ''  ALBUMIN'' in the last 72 hours.    No results for input(s): ''TSH'', ''HGBA1C'' in the last 72 hours.  No results for input(s): ''CHOL'', ''CHOLHDL'', ''CHOLDLCAL'', ''CHOLDLQ'', ''TRIGLY'' in the last 72 hours.  No results for input(s): ''TROPONIN'', ''BNP'' in the last 72 hours.  No results for input(s): ''PT'', ''INR'', ''APTT'' in the last 72 hours.    No results for input(s): ''BACULBLD'', ''BACULUR'', ''BACULRSP'' in the last 72 hours.  No results for input(s): ''GLUCOSEPOC'' in the last 72 hours.    Urinalysis:  Invalid input(s): ''URINEPH'', ''URINEWBC'', ''UAWBC''    Studies:     Imaging:   XR kub portable (1 view)   Final Result by Donna Gross., MD (05/07 1312)   IMPRESSION:      Normal bowel gas pattern.   Large stool burden.   No signs of pneumatosis or pneumoperitoneum.   Right upper quadrant surgical clips. Left upper quadrant anastomotic suture line.            Signed by: Donna Gross   04/27/2023 1:12 PM      CT abd+pelvis angiogram wo+w contrast   Final Result by Donna Gross., MD (05/03 1753)   IMPRESSION:      No active bleed. Near interval resolution of the previously visualized small volume contrast in the terminal ileum and cecum, which may have been transient hemorrhage due to right sided colitis given proximal colonic wall thickening and mucosal    hyperenhancement and/or mild angiodysplasia given apparent serpiginous appearance of the terminal branch of the ileocolic artery. No bowel ischemia.                 Signed by: Donna Gross   04/23/2023 5:53 PM      CT abd+pelvis angiogram wo+w contrast   Final Result by Donna Fischer, MD (05/03 1254)   IMPRESSION:         1. 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 Arteries beyond their origin are patent.   2. Circumferential wall thickening in the ascending and proximal transverse colon, suspicious for colitis which may be ischemic or infectious in etiology.   3. High density foci at the inferior cecum and terminal ileal loops, seen on venous phase imaging and not included in the field-of-view on the noncontrast and arterial phase scans, suspicious for extravasation at the cecum with retrograde flow into    distal ileal loops versus extravasation of the distal ileal loops with some antegrade flow. Consider follow-up CT in 6 to 8 hours and/or IR consultation.      These results were discussed with Donna Gross at 04/23/2023 12:51 PM.               Signed by: Donna Gross   04/23/2023 12:54 PM      Cath Lab Peripheral Diagnostic and Interventional    (Results Pending)       Assessment and Plan:     Donna Gross is a 57 y.o. female with PMHx of anxiety, depression, emphysema, fibromyalgia, GERD, SMA stenosis who presents with intractable abdominal pain. Likely component of pain is from SMA stenosis. Has been hospitalized many times for similar presentations with negative work-up aside from SMA stenosis. Admitted pending Vascular surgery management to address SMA syndrome.     #Acute on chronic abdominal pain, POA  #SMA stenosis, chronic, POA  #Celiac stenosis, POA  #CT evidence of CAD, POA  Pt  been suffering with abdominal pain for approx. 1 year. Has been admitted several times previously with similar complaints. Recently admitted for similar presentation but CT imaging now with evidence of CT extravasation in cecum and ileum c/f possible worsening ischemia. In addition SMA further stenosis and now mild celiac stenosis in addition. Previously evaluated by vascular surgery and IR for interventions but those teams not favorable to SMA as culprit of pain given negative lactate. New CT findings raise greater c/f SMA occlusion complications. Reassuringly, short interval repeat CT w/o evidence of further ischemia or bleeding. While lactate remains negative, query intermittent elevation not appreciated on our lab collections. Patients symptoms fit with SMA syndrome- worsening postprandial symptoms, food aversion leading to acute refusal, and abdominal pain. Given clinical stability, no concern for acute bowel ischemia despite increase in lactate on 5/4, pain and PO intolerance likely due to her chronic superior mesenteric stenosis. Procedure planned for today with Vascular surg.   -Vascular surgery consult, appreciate recs   - Plan for angio procedure at [facility] on Thursday 5/9   - Will hold Lovenox starting Wednesday PM   - NPO @ [facility] on 5/9  -pain management consult, appreciate recs  -Pain regimen:  - Tylenol ATC  - Gabapentin 300mg  TID  - Duloxetine 60mg  qday  - Oxycodone sliding scale 5/7.5 q4h PRN  - Dilaudid BT pain 0.5mg  q4h PRN  -pantoprazole 40 mg BID  -calcium carbonate PRN  -DC heparin drip 5/7 --> therapeutic Lovenox BID  -continue atrovastatin 40 mg at bedtime  -consider colonoscopy     #Diarrhea, chronic, POA, resolved  #Nausea/Vomiting, chronic, POA  #Constipation, acute, POA  Pt with chronic nausea/vomiting/diarrhea over last year. So far work up has included negative lab studies: c.diff, celiac panel, h.pylori, parasitic enteric panel. Pt unable to tolerate colonoscopy and negative EGD. Given CT findings of colitis could query infectious etiology but lack of fever, leukocytosis, and sick contacts with fairly recent workup while with current symptoms negative point against. Query poorly controlled GERD. Also consider pain contributing n/v. Diarrhea has now resolved, patient has now been constipated with last BM 5/4. XR KUB without evidence of ileus, with large stool burden.  -Increase bowel regimen as below  -continue home hyoscyamine 125 mcg q4hrs PRN  -zofran 4mg  q6hrs     #Poor PO intake, chronic, POA  #Severe protein malnutrition, POA  #Severe muscle loss with cachexia due to severe malnutrition, POA  Previously evaluated by nutrition who had recommended that pt receive boost TID for assistance with malnutrition likely 2/2 poor PO intake from abdominal pain. Reports weight loss of approx 50lbs in the past year. Poor appetite 2/2 pain.   -Regular diet  -Discontinued fluids given adequate PO intake  -nutrition consult, appreciate recs  -nutrition supplements TID  -thiamine, vitamin D, vitamin B supplementation    #Normocytic anemia, Chronic POA  Pt presents with hgb of 9.8 c/w her baseline of 9-10. Query anemia 2/2 to nutritional deficincies from poor PO intake. From chart review, no recent vitamin or iron studies. Will order to further characterize. Denies SS of bleeding. Folate/B12 WNL. Iron studies c/f for element of IDA. Given current pain and not wanting to mask diarrhea, will hold on iron supplementation for now but consider when more clinically stable  -daily cbc  -consider iron supplementation as outpatient     #Tobacco use, POA  Previous PPD smoker from age 36 and reduced over last 7 years to now 1 cigarette all day. Requests nicotine patch while IP.   -  nicotine patch  -counseled on nicotine cessation     #Anxiety, chronic, POA  #Major Depression, chronic, POA  #Fibromyalgia, chronic, POA   #Insomnia, chronic, POA  Chronic conditions. Will continue home medications.   - Continue Seroquel 150mg  BID  - Increase duloxetine to 60 mg qday as above  - Trazodone 50 mg at bedtime PRN    #FEN/GI:   - Regular diet  - Bowel regimen:   - Miralax BID + additional spot dose on 05/08   - senna BID   - bisacodyl suppository PRN    #Access: pIV    #Prophylaxis:  - Lovenox as above    #Code: Full Code    Stark Klein, MD who agrees with the above assessment and plan unless otherwise noted in addendum.    Author:  Christoper Allegra. Steffanie Dunn, MD 04/29/2023 6:32 AM   [place] [facility] PGY-1

## 2023-04-29 NOTE — Consults
Occupational Therapy Evaluation      PATIENT: Donna Gross  MRN: [id number]  DOB: 10/27/1966    ADMIT DATE: 04/26/2023       Date of Evaluation: 04/28/2023    Problems: Principal Problem:    Colitis (POA: Yes)  Active Problems:    Generalized abdominal pain (POA: Yes)       Past Medical History:   Diagnosis Date    Anxiety     Depression     Emphysema, unspecified (HCC/RAF)     Emphysema, unspecified (HCC/RAF)     Fibromyalgia     Gastritis     GERD (gastroesophageal reflux disease)     History of blood transfusion     Intractable nausea and vomiting 01/23/2020    Pancreatitis     Past Surgical History:   Procedure Laterality Date    ABDOMINAL SURGERY      COLON SURGERY          Relevant Hospital Course: 57 y.o. female with PMHx of anxiety, depression, emphysema, fibromyalgia, GERD, SMA stenosis who presents with intractable abdominal pain likely from SMA stenosis. New CT findings raise greater c/f SMA occlusion complications. Pending angio procedure at [facility] on 05/09.     Patient Stated Goal:  unstated, but agreeable to OT evaluation     Living Arrangements   Type of Home: Apartment  Home Layout: One level, Stairs to enter with rails  # Stairs to enter: 2  Bathroom Shower/Tub: Pension scheme manager: Midwife: None  Home Equipment: 4-wheeled walker    Prior Level of Function   Level of Independence: Independent, 4-wheeled walker, Limited community distances (1/2 block, intermittently uses 4WW inside/outside home)  Lives With: Daughter, Parent(s) (Mother)  Support Available: Family  # of hours available: Mother, daughter available prn  ADL Assistance: Independent, Activities of Daily Living, Instrumental Activities of Daily Living (Pt has ''bad day'' and req's assist for ADLs/IADLs, but normally able to do independently- one task at a time.)  Homemaking Assistance: Independent  Vocation: On disability  Vision: Glasses, Reading, Distance  Hearing: Within Education administrator: Driven by Others, Public    Precautions   Precautions: Dance movement psychotherapist;Check Labs  Orthotic: None  Current Activity Order: Activity as tolerated;Ambulate;Other (Comment) (patient)  Weight Bearing Status: Not Applicable  Additional Weight Bearing Status: Not Applicable    GENERAL EVALUATION   Position: In bed;Bed alarm on  Lines/devices Drains: Cardiac Monitor;Pulse Ox    Bed Mobility   Supine Scooting: Stand by Assist  Rolling: Not Performed  Supine to Sit: Stand by Assist;to Right  Sit to Supine: Stand by Assist;to Left    Functional Transfers   Sit to Stand: Verbal Cueing;Contact Guard Assist (STS x1 only, initially unsteady, but able to self correct.)  Transfer: Not Performed  Functional Mobility: Declined;Not Performed (2/2 not feeling well, despite therapist encouragement)    Activities of Daily Living (ADLs)   Eating: Not performed  Grooming: Not performed (will continue to assess)  Bathing: Not performed  UB Dressing: Performed  UB Dressing Assistance: Stand by Assist  UB Dressing Deficit: Pull around back  UB Dressing Adaptive Equipment: None  UB Dressing Where Assessed: Edge of bed  LB Dressing: Performed  LB Dressing Assistance: Stand by Assist  LB Dressing Deficit: Don/doff L shoe;Don/doff R shoe  LB Dressing Adaptive Equipment: None  LB Dressing Where Assessed: Edge of bed  Toileting: Not performed (pt did not need to void during eval)    Balance  Sitting - Static: Good  Sitting - Dynamic: Good     RUE Assessment   RUE Assessment: Within Functional Limits              LUE Assessment   LUE Assessment: Within Functional Limits              Hand Function   Gross Grasp: Functional;Right;Left  Coordination: Left;Right;Functional    Edema   Edema: BUE absent             Sensation   Sensation: Grossly intact    Cognition   Cognition: Within Defined Limits  Safety Awareness: Good awareness of safety precautions  Barriers to Learning: None        Pain Assessment   Patient complains of pain: Yes  Pain Quality: Discomfort  Pain Scale Used: Numeric Pain Scale  Pain Intensity: 10/10  Pain Location: Abdomen  Action Taken: Positioning;Therapy techniques to manage pain;Nursing notified                             Patient Status   Activity Tolerance: Fair  Oxygen Needs: Room Air  Response to Treatment: Vital signs stable;Tolerated treatment well;Fatigued;with activity;Resolved with rest  Compliance with Precautions: Not Applicable  Call light in reach: Yes  Presentation post treatment: In bed;On cardiac monitor;Lines/drains intact;Bed alarm on;Pulse Ox  Comments: Pt amenable to OT evaluation with encouragement. Pt tolerated seated EOB and STSx1, deferring further mobility 2/2 malaise and abdominal pain. Pt reports using bathroom with no AD and no issues while in house. Pt returned BTB at end of session with all needs within reach. Recommend follow up session to focus on standing/OOB ADLs and possible d/c after further assessment.    Interdisciplinary Communication   Interdisciplinary Communication: Nurse;Physical Therapist    ASSESSMENT   Rehab Potential: Good  Inpatient Recommendation: OT treatment  Problem List: Pain limiting ADLs;Decreased functional transfers;Decreased self care skills;Decreased functional balance;Impaired functional endurance;Discharge needs  Treatment Plan: ADL training;Patient and/or family education;Training on use of assistive devices;Discharge planning;Energy conservation;Graded functional activities;Functional transfer training;Functional balance activities  Frequency: 3-5 x/week  Duration (days): 30  Progress Note Due Date: 05/05/23    Goals Discussed With: Patient    Short Term Goals to be achieved in: 7 days  Pt will groom self: standing;with supervision  Pt will toilet self: with supervision  Pt will dress upper body: sitting edge of bed;sitting in chair;with supervision  Pt will dress lower body: sitting edge of bed;sitting in chair;with supervision  Pt will perform: stand step transfer;to/from toilet;to/from chair;with supervision    Long Term Goals to be achieved in: 30 days  Pt will be: safe and independent performing self care activities;out of bed 3x/day with assistance as appropriate    OT Recommendations   Discharge Recommendation: No skilled OT needs upon discharge  Discharge concerns: Requires supervision for mobility;Requires supervision for self care  Discharge Equipment Recommended: Shower Chair;To be assessed further      Evaluation Completed by: Linden Dolin, OT,  04/28/2023

## 2023-04-30 LAB — Basic Metabolic Panel
CREATININE: 1.06 mg/dL (ref 0.60–1.30)
SODIUM: 135 mmol/L (ref 135–146)

## 2023-04-30 LAB — Differential Automated: LYMPHOCYTE PERCENT, AUTO: 25.4 (ref 0.00–0.10)

## 2023-04-30 LAB — Magnesium: MAGNESIUM: 1.4 meq/L (ref 1.4–1.9)

## 2023-04-30 LAB — CBC: RED BLOOD CELL COUNT: 3.64 x10E6/uL — ABNORMAL LOW (ref 3.96–5.09)

## 2023-04-30 LAB — Phosphorus: PHOSPHORUS: 4.1 mg/dL (ref 2.3–4.4)

## 2023-04-30 LAB — Activated Clotting Time, POC: ACTIVATED CLOTTING TIME, POCT: 333 s

## 2023-04-30 MED ORDER — ACETAMINOPHEN 500 MG PO TABS
500 mg | ORAL_TABLET | Freq: Four times a day (QID) | ORAL | 0 refills | PRN
Start: 2023-04-30 — End: ?

## 2023-04-30 MED ORDER — DULOXETINE HCL 60 MG PO CPEP
60 mg | ORAL_CAPSULE | Freq: Every day | ORAL | 0 refills
Start: 2023-04-30 — End: ?
  Filled 2023-05-02: qty 30, 30d supply, fill #0

## 2023-04-30 MED ORDER — ONDANSETRON HCL 4 MG PO TABS
4 mg | ORAL_TABLET | Freq: Four times a day (QID) | ORAL | 0 refills | 8.00000 days | PRN
Start: 2023-04-30 — End: ?

## 2023-04-30 MED ORDER — CALCIUM CARBONATE ANTACID 500 (200 CA) MG PO CHEW
500 mg | ORAL_TABLET | Freq: Two times a day (BID) | ORAL | 0 refills | PRN
Start: 2023-04-30 — End: ?
  Filled 2023-05-02 (×2): qty 30, 15d supply, fill #0

## 2023-04-30 MED ORDER — CLOPIDOGREL BISULFATE 75 MG PO TABS
75 mg | ORAL_TABLET | Freq: Every day | ORAL | 0 refills
Start: 2023-04-30 — End: ?
  Filled 2023-05-02: qty 30, 30d supply, fill #0

## 2023-04-30 MED ORDER — GABAPENTIN 300 MG PO CAPS
300 mg | ORAL_CAPSULE | Freq: Three times a day (TID) | ORAL | 0 refills
Start: 2023-04-30 — End: ?
  Filled 2023-05-02: qty 90, 30d supply, fill #0

## 2023-04-30 MED ORDER — ASPIRIN 81 MG PO CHEW
81 mg | ORAL_TABLET | Freq: Every day | ORAL | 0 refills | 30.00000 days
Start: 2023-04-30 — End: ?
  Filled 2023-05-02 (×2): qty 90, 90d supply, fill #0

## 2023-04-30 MED ORDER — POLYETHYLENE GLYCOL 3350 17 G PO PACK
17 g | PACK | Freq: Every day | ORAL | 0 refills | 28.00000 days | PRN
Start: 2023-04-30 — End: ?
  Filled 2023-05-02: qty 510, 30d supply, fill #0

## 2023-04-30 MED ADMIN — FENTANYL CITRATE (PF) 100 MCG/2ML IJ SOLN: INTRAVENOUS | @ 01:00:00 | Stop: 2023-04-30 | NDC 00409909422

## 2023-04-30 MED ADMIN — ACETAMINOPHEN 500 MG PO TABS: 1000 mg | ORAL | Stop: 2023-05-01 | NDC 00904673061

## 2023-04-30 MED ADMIN — ASPIRIN 81 MG PO CHEW: 81 mg | ORAL | @ 16:00:00 | Stop: 2023-05-01 | NDC 66553000201

## 2023-04-30 MED ADMIN — PANCRELIPASE (LIP-PROT-AMYL) 36000-114000 UNITS PO CPEP: 36000 [IU] | ORAL | @ 20:00:00 | Stop: 2023-05-01 | NDC 00032301613

## 2023-04-30 MED ADMIN — ACETAMINOPHEN 500 MG PO TABS: 1000 mg | ORAL | @ 15:00:00 | Stop: 2023-05-01 | NDC 00904673061

## 2023-04-30 MED ADMIN — NICOTINE 7 MG/24HR TD PT24: 7 mg | TRANSDERMAL | @ 15:00:00 | Stop: 2023-05-01

## 2023-04-30 MED ADMIN — DULOXETINE HCL 60 MG PO CPEP: 60 mg | ORAL | @ 15:00:00 | Stop: 2023-05-01 | NDC 60687074511

## 2023-04-30 MED ADMIN — HYDROMORPHONE HCL 1 MG/ML IJ SOLN: .5 mg | INTRAVENOUS | @ 17:00:00 | Stop: 2023-05-01 | NDC 00409128331

## 2023-04-30 MED ADMIN — GABAPENTIN 300 MG PO CAPS: 300 mg | ORAL | @ 20:00:00 | Stop: 2023-05-01 | NDC 60687059111

## 2023-04-30 MED ADMIN — RANOLAZINE ER 500 MG PO TB12: 500 mg | ORAL | @ 07:00:00 | Stop: 2023-05-01 | NDC 60687054911

## 2023-04-30 MED ADMIN — CHOLECALCIFEROL 25 MCG (1000 UT) PO TABS: 50 ug | ORAL | @ 16:00:00 | Stop: 2023-05-01

## 2023-04-30 MED ADMIN — OXYCODONE HCL 5 MG PO TABS: 7.5 mg | ORAL | @ 15:00:00 | Stop: 2023-05-01 | NDC 68084035411

## 2023-04-30 MED ADMIN — PANTOPRAZOLE SODIUM 40 MG PO TBEC: 40 mg | ORAL | @ 07:00:00 | Stop: 2023-05-01 | NDC 50268063911

## 2023-04-30 MED ADMIN — OXYCODONE HCL 5 MG PO TABS: 7.5 mg | ORAL | @ 08:00:00 | Stop: 2023-05-01 | NDC 68084035411

## 2023-04-30 MED ADMIN — NICOTINE 7 MG/24HR TD PT24: 7 mg | TRANSDERMAL | @ 18:00:00 | Stop: 2023-05-01 | NDC 00536589488

## 2023-04-30 MED ADMIN — TRAZODONE HCL 25 MG PO TABS: 50 mg | ORAL | @ 08:00:00 | Stop: 2023-05-01

## 2023-04-30 MED ADMIN — CHOLESTYRAMINE 4 G PO PACK: 4 g | ORAL | @ 13:00:00 | Stop: 2023-05-01 | NDC 68382052860

## 2023-04-30 MED ADMIN — MAGNESIUM SULFATE 2 GM/50ML IV SOLN: 2 g | INTRAVENOUS | @ 22:00:00 | Stop: 2023-04-30 | NDC 70121171901

## 2023-04-30 MED ADMIN — DEXMEDETOMIDINE HCL 200 MCG/2ML IV SOLN: INTRAVENOUS | @ 01:00:00 | Stop: 2023-04-30 | NDC 71288050503

## 2023-04-30 MED ADMIN — HYDROMORPHONE HCL 1 MG/ML IJ SOLN: .5 mg | INTRAVENOUS | @ 10:00:00 | Stop: 2023-05-01 | NDC 00409128331

## 2023-04-30 MED ADMIN — THIAMINE HCL 100 MG PO TABS (MULTI GPI): 100 mg | ORAL | @ 16:00:00 | Stop: 2023-05-01 | NDC 77333093425

## 2023-04-30 MED ADMIN — ATORVASTATIN CALCIUM 40 MG PO TABS: 40 mg | ORAL | @ 07:00:00 | Stop: 2023-05-01 | NDC 68084009911

## 2023-04-30 MED ADMIN — PANTOPRAZOLE SODIUM 40 MG PO TBEC: 40 mg | ORAL | @ 16:00:00 | Stop: 2023-05-01 | NDC 50268063911

## 2023-04-30 MED ADMIN — GABAPENTIN 300 MG PO CAPS: 300 mg | ORAL | @ 13:00:00 | Stop: 2023-05-01 | NDC 60687059111

## 2023-04-30 MED ADMIN — ONDANSETRON HCL 4 MG/2ML IJ SOLN: INTRAVENOUS | @ 01:00:00 | Stop: 2023-04-30 | NDC 60505613005

## 2023-04-30 MED ADMIN — PANCRELIPASE (LIP-PROT-AMYL) 36000-114000 UNITS PO CPEP: 36000 [IU] | ORAL | @ 15:00:00 | Stop: 2023-05-01 | NDC 00032301613

## 2023-04-30 MED ADMIN — GABAPENTIN 300 MG PO CAPS: 300 mg | ORAL | @ 07:00:00 | Stop: 2023-05-01

## 2023-04-30 MED ADMIN — PANTOPRAZOLE SODIUM 40 MG PO TBEC: 40 mg | ORAL | @ 07:00:00 | Stop: 2023-05-01

## 2023-04-30 MED ADMIN — B COMPLEX PO CAPS: 1 | ORAL | @ 16:00:00 | Stop: 2023-05-01

## 2023-04-30 MED ADMIN — ZZ IMS TEMPLATE: 150 mg | ORAL | @ 16:00:00 | Stop: 2023-05-01 | NDC 60687034911

## 2023-04-30 MED ADMIN — SENNOSIDES 8.6 MG PO TABS: 2 | ORAL | @ 07:00:00 | Stop: 2023-05-19 | NDC 00904725261

## 2023-04-30 MED ADMIN — RANOLAZINE ER 500 MG PO TB12: 500 mg | ORAL | @ 16:00:00 | Stop: 2023-05-01 | NDC 60687054911

## 2023-04-30 MED ADMIN — HYDROMORPHONE HCL 2 MG/ML IJ SOLN: INTRAVENOUS | @ 02:00:00 | Stop: 2023-04-30 | NDC 00641615125

## 2023-04-30 MED ADMIN — CLOPIDOGREL BISULFATE 75 MG PO TABS: 150 mg | ORAL | @ 03:00:00 | Stop: 2023-04-30 | NDC 68084053611

## 2023-04-30 MED ADMIN — GABAPENTIN 300 MG PO CAPS: 300 mg | ORAL | @ 07:00:00 | Stop: 2023-05-01 | NDC 60687059111

## 2023-04-30 MED ADMIN — CHOLESTYRAMINE 4 G PO PACK: 4 g | ORAL | @ 23:00:00 | Stop: 2023-05-01 | NDC 68382052860

## 2023-04-30 MED ADMIN — ENOXAPARIN SODIUM 40 MG/0.4ML IJ SOSY: 40 mg | SUBCUTANEOUS | @ 17:00:00 | Stop: 2023-05-01

## 2023-04-30 MED ADMIN — POLYETHYLENE GLYCOL 3350 17 G PO PACK: 17 g | ORAL | @ 16:00:00 | Stop: 2023-05-01 | NDC 60687043199

## 2023-04-30 MED ADMIN — HYDROMORPHONE HCL 2 MG/ML IJ SOLN: INTRAVENOUS | @ 01:00:00 | Stop: 2023-04-30 | NDC 00641615125

## 2023-04-30 MED ADMIN — ZZ IMS TEMPLATE: 150 mg | ORAL | @ 07:00:00 | Stop: 2023-05-24

## 2023-04-30 MED ADMIN — FLUTICASONE-SALMETEROL 250-50 MCG/ACT IN AEPB: 1 | RESPIRATORY_TRACT | @ 19:00:00 | Stop: 2023-05-01

## 2023-04-30 MED ADMIN — ACETAMINOPHEN 500 MG PO TABS: 1000 mg | ORAL | @ 07:00:00 | Stop: 2023-05-01 | NDC 00904673061

## 2023-04-30 MED ADMIN — ZZ IMS TEMPLATE: 150 mg | ORAL | @ 07:00:00 | Stop: 2023-05-01 | NDC 60687034911

## 2023-04-30 MED ADMIN — OXYCODONE HCL 5 MG PO TABS: 7.5 mg | ORAL | @ 22:00:00 | Stop: 2023-05-01 | NDC 68084035411

## 2023-04-30 MED ADMIN — CHOLESTYRAMINE 4 G PO PACK: 4 g | ORAL | @ 17:00:00 | Stop: 2023-05-23 | NDC 68382052860

## 2023-04-30 MED ADMIN — MAGNESIUM SULFATE 4 GM/100ML IV SOLN: 4 g | INTRAVENOUS | @ 20:00:00 | Stop: 2023-04-30 | NDC 00409672923

## 2023-04-30 MED ADMIN — CLOPIDOGREL BISULFATE 75 MG PO TABS: 75 mg | ORAL | @ 16:00:00 | Stop: 2023-05-01 | NDC 68084053611

## 2023-04-30 MED ADMIN — CHOLESTYRAMINE 4 G PO PACK: 4 g | ORAL | @ 20:00:00 | Stop: 2023-05-01

## 2023-04-30 MED ADMIN — RANOLAZINE ER 500 MG PO TB12: 500 mg | ORAL | @ 07:00:00 | Stop: 2023-05-01

## 2023-04-30 MED ADMIN — IODIXANOL 320 MG/ML IV SOLN: 180 mL | INTRAVENOUS | @ 01:00:00 | Stop: 2023-04-30 | NDC 00407222321

## 2023-04-30 MED ADMIN — NITROGLYCERIN 100 MCG/ML 10 ML SYRINGE: Stop: 2023-04-30 | NDC 08252025010

## 2023-04-30 NOTE — Progress Notes
Physical Therapy Treatment      PATIENT: Donna Gross  MRN: [id number]    Treatment Date: 04/30/2023    Patient Presentation: Position: In bed;Bed alarm on  Lines/devices Drains: IV/PICC;Cardiac Monitor;Pulse Ox    Pertinent Updates: s/p angio procedure yesterday at [facility], found high grade stenosis of SMA s/p stent placement    Precautions   Precautions: Fall risk;Monitor Vitals;Check Labs  Orthotic: None  Current Activity Order: Activity as tolerated;Ambulate  Weight Bearing Status: Not Applicable    Cognition   Cognition: Within Defined Limits  Safety Awareness: Good awareness of safety precautions  Barriers to Learning: None    Bed Mobility   Supine to Sit: Supervised;to Right  Sit to Supine: Supervised;to Left    Functional Mobility   Sit to Stand: Stand by Assist;Assistive Device (Comment) (to IV pole)  Transfer(s) Performed:  (patient declined chair transfer)  Ambulation: Stand by Assist  Ambulation Distance (Feet): >300', mild trunk sway, initially with IV pole, then without AD.  Gait Pattern: Shuffled;Decreased pace;Decreased stride length;Decreased heel-toe  Assistive Device: Right;IV pole;None  Stairs: Not Performed (TBA as able, anticipate patient will be able to safely navigate 2 stairs into her home with family assist)         Pain Assessment   Patient complains of pain: Yes  Pain Quality: Aching  Pain Scale Used: Numeric Pain Scale  Pain Intensity: 8/10;9/10  Pain Location: Abdomen  Action Taken: Nursing notified;Pain mgmt education;Positioning       Patient Status   Activity Tolerance: Good  Oxygen Needs: Room Air  Response to Treatment: Vital signs stable;Fatigued;Pain;with activity;Resolved with rest;Nursing notified;Tolerated treatment well  Compliance with Precautions: Not Applicable  Call light in reach: Yes  Presentation post treatment: In bed;Lines/drains intact;Bed alarm on;On cardiac monitor;Pulse Ox  Comments: Recommend patient ambulate BID-TID with nursing staff stand-by assist.    Interdisciplinary Communication   Interdisciplinary Communication: Nurse;Occupational Therapist    Treatment Plan   Continue PT Treatment Plan with Focus on: Gait training;Stair training    PT Recommendations   Discharge Recommendation: Physical Therapy;Would benefit from continued therapy  Discharge concerns: Requires supervision for mobility  Discharge Equipment Recommended: Owns recommended DME    Treatment Completed by: Elyse Hsu, PT

## 2023-04-30 NOTE — Nursing Note
2 RN skin assessment completed with Rosann Auerbach, RN

## 2023-04-30 NOTE — Interdisciplinary
BOOST Note  (Better Outcomes by Optimizing Safe Transitions)   Name: Donna Gross  MRN: [id number]  DOB: 1966/10/16    Date of Admit: 04/26/2023  Anticipated Date of Discharge: 05/01/2023    Anticipated Disposition: Home   BOOST Element Patient Assessment Best Practices/Interventions   P1 # Problems with Medications    []  N/A Was re-hospitalization due to a medication?   []  Yes        [x]  No  If yes, indicate which med:    []  Teachback performed to educate on administration/timing of medication and managing adverse drug events.      Indicate which High Risk Medications the patient has prescribed:  []  Insulin    [x]  Anticoagulants   [x]  Opioids     []  Immunosuppressive Drugs    []  N/A        Teachback is completed for:  []  Insulin     []  Anticoagulants  []  Opioids    []  Immunosuppressives    []  Patient/family need(s) reinforcement on:     []  Educate patient/family on the Meds to [facility].    Patient is on the following common medications:  [x]  BP meds   []  Diuretics  [x]  Statins  []  N/A        Patient will be discharged on a new medication?  []  No       []  Yes, indicate which one:  Teachback is completed for:  []  BP meds  []  Diuretics  []  Statins    []  Patient/family need(s) reinforcement on:    []  Teachback completed on new medication(s) for discharge.   P2 # Psychological    [x]  N/A [place] Suicide Risk Assessment:  No risk  []  Patient may benefit from SW consult due to: Social Worker consult completed?  []  Yes  []  No  []  Pending   P3 #  Principal Diagnosis  []  N/A Patient has diagnosis of:  []  Cancer  []  Stroke  []  Diabetes []  COPD    []  Heart Failure  []  Acute MI  []  Pneumonia    []  Hx of transplant  []  Sepsis Teachback completed on:  []  Reason for re-hospitalization related to illness  []  Who to call/what to do in event of worsening symptoms  []  S/S to watch for  []  Self-care management  []  Importance of follow up care    Patient needs/is experiencing the following:  []  DME  []  O2 at home  []  Presence of foley []  Poor oral intake <50%  []  No BM>2 days    [] Positive CAM  []  MD aware   []  CM aware  []  SW aware      Patient needs the following Discharge DME:  []  AVAP []  Nebulizer  []  Portable O2    []  Wound Vac []  DME delivered to bedside  []  DME delivered to home  []  Pending delivery (indicate which one):     LDA Teach Back education required for discharge:  []  Oxygen  []  PICC []  PortACath  []  Enteral feeding  []  Foley catheter (leg bag)   []  Drain (indicate):    []  PleurX  []  Wound Vac []  Teach back completed for (indicate):    []  Needs reinforcement for:   P4 # Physical Limitations  []  N/A SLP consult indicated?  []  Yes   [x]  No SLP consult completed?  []  Yes  []  Pending  []  No    PT consult indicated?  [x]  Yes   []  No PT consult completed?  []   Yes  []  Pending  []  No    Discharge recommendation from PT/OT:  Would benefit from continued therapy, Physical Therapy ,      Discharge DME recommended by PT/OT:  To be assessed further, Owns recommended DME    Discharge DME needed:  []  FWW  []  W/C  []  Cane  []  Crutches    []  Other:  []  DME delivered to bedside  []  DME delivered to home  []  Pending delivery (indicate which one):    Discharge transportation needs:  []  Self  []  Family/friend:   []  Taxi/Uber    []  Ambulance  []  ACLS     []  RN     []  RT    []  Non-emergent transportation   Is transport set up?  []  Yes  []  Notified CM of need    []  Pending confirmation of transport   P5 # Health Literacy  [x]  N/A []  Patient has poor health literacy due to:   Identify committed discharge support person:  []  Self  []  Spouse  []  Family:   []  Friend:      []  Caregiver     []  Document appropriate educational interventions used (i.e. handouts, teach back, used interpreter, etc.)   P6 # Patient Support  [x]  N/A Patient needs the following support:  []  Insufficient care at home  []  Caregiver unable to provide adequate care  []  Caregiver unable to make appropriate decisions  []  Homeless  []  Food instability    []  Transportation  []  Info on additional resources  []  Other:   Notified team of need:  []  MD   []  CM   []  SW    Child psychotherapist consult completed?  []  Yes   []  Pending   []  No     P7 # Prior Hospitalization  []  N/A Patient was hospitalized in the last 6 months. Reasons for re-hospitalization:  [x]  Severity of illness    []  Missed f/u care with PMD within 7 days of discharge  []  Did not report s/s to PMD    []  Medication related  []  Missed HD due to:     []  Lack of transportation to f/u care    []  Other: []  Document appropriate education on illness, medications, importance of follow up appointments, being aware of worsening s/s and what to do/who to contact   P8 # Palliative Care  [x]  N/A Patient has uncontrolled symptoms related to life-limiting illness?  []  Yes      []  No   Primary team notified to consider symptom control:  []  Yes   []  Pending   []  No    Does the patient/family need a goals of care discussion?   []  Yes      []  No Was the goals of care discussion held?  []  Yes   []  Pending   []  No    Does the patient need a palliative care consult?  []  Yes      []  No Was the palliative care consult completed?  []  Yes   []  Pending   []  No

## 2023-04-30 NOTE — Nursing Note
1615: Pt remains under care of anesthesia.  Right groin stable.  No change in pedal pulses.  Waiting for PACU bed.  1630.  Pt remains under care of anesthesia.  Right groin stable.  No change in pedal pulses.  Waiting for PTU bed.  1641:  Pt transferred to PTU without complications.

## 2023-04-30 NOTE — Progress Notes
Occupational Therapy Treatment    PATIENT: Donna Gross  MRN: [id number]    Treatment Date: 04/30/2023    Patient Presentation: Position: In bed;Bed alarm on  Lines/devices Drains: Cardiac Monitor;Pulse Ox;IV/PICC  B/B Devices: External cath    Pertinent Updates: s/p angio procedure yesterday at [facility]. Continue at transfer orders embedded in OT treatment plan order    Precautions   Precautions: Fall risk;Monitor Vitals;Check Labs  Orthotic: None  Current Activity Order: Activity as tolerated;Ambulate  Weight Bearing Status: Not Applicable  Additional Weight Bearing Status: Not Applicable    Cognition   Cognition: Within Defined Limits  Safety Awareness: Good awareness of safety precautions  Barriers to Learning: None    Bed Mobility   Supine Scooting: Supervised  Rolling: Not Performed  Supine to Sit: Supervised;to Right  Sit to Supine: Supervised;to Left    Functional Transfers   Sit to Stand: Stand by Assist;Verbal Cueing  Transfer: From;To;Bed  Level of Assist: Stand by Assist  Type of Transfer: Stand step  Functional Mobility: Stand by Assist;Verbal Cueing (Initially with IV pole, transitioned to w/o IV pole by end of session)    Activities of Daily Living (ADLs)   UB Dressing: Performed  UB Dressing Assistance: Stand by Assist  UB Dressing Deficit: Pull down in back;Pull around back  UB Dressing Adaptive Equipment: None  UB Dressing Where Assessed: Edge of bed  LB Dressing: Performed  LB Dressing Assistance: Stand by Assist  LB Dressing Deficit: Don/doff R shoe;Don/doff L shoe  LB Dressing Adaptive Equipment: None  LB Dressing Where Assessed: Edge of bed    Balance   Sitting - Static: Good  Sitting - Dynamic: Good                 Pain Assessment   Patient complains of pain: No                        Patient Status   Activity Tolerance: Good  Oxygen Needs: Room Air  Response to Treatment: Tolerated treatment well  Compliance with Precautions: Not Applicable  Call light in reach: Yes  Presentation post treatment: In bed;On cardiac monitor;Lines/drains intact;Bed alarm on;Pulse Ox    Interdisciplinary Communication   Interdisciplinary Communication: Nurse;Physical Therapist    Treatment Plan   Continue OT Treatment Plan with Focus on: ADL training;Functional mobility training;Discharge planning;Energy conservation;Patient/family/caregiver education and training;Assistive device training    OT Recommendations   Discharge Recommendation: No skilled OT needs upon discharge  Discharge concerns: Requires supervision for mobility;Requires supervision for self care  Discharge Equipment Recommended: Shower Chair    Treatment Completed by: Linden Dolin, OT    Co-treatment with PT provided for patient due to complexity of patient diagnosis, which requires the judgment, knowledge, and skills of both qualified therapists.  The patient requires both therapists working in collaboration to meet the goals and will have better outcomes working in co-treatment than they would by completing the therapy in a 1:1 session.  OT tx with focus on goals of ADL and functional mobility.

## 2023-04-30 NOTE — Progress Notes
Family Medicine Progress Note    Primary Care Provider: Kelle Darting, MD  Attending Physician: Debbe Mounts, MD  Senior Resident Physician: Foye Clock, MD  Junior Resident Physician: Burnell Blanks, MD    Chief complaint: Abdominal pain    Subjective:     Overnight/Interval events:   - Underwent angio procedure yesterday at [facility]  - Per op note, found high grade stenosis of SMA, placed stent with excellent luminal gain with filling of distal SMA branches following  - Surgery recommended DAPT therapy, CLD    This AM:  - Feels okay this morning, says abdominal pain has improved  - Tolerated CLD, two cups of jello without N/V/abdominal pain  - Does not want to go home today, as she is very concerned that her pain will come back with eating  - Denies f/c, n/v/d, SOB, CP    PRNs: Oxycodone 7.5mg  x1, Dilaudid 0.5 mg IV x1 post-op      Medications:     Continuous Medications:       Scheduled Medications:     acetaminophen  1,000 mg Oral Q8H    aspirin  81 mg Oral Daily    atorvastatin  40 mg Oral QHS    cholestyramine  4 g Oral TID AC    clopidogrel  75 mg Oral Daily    DULoxetine  60 mg Oral Daily    fluticasone-salmeterol  1 puff Inhalation BID    gabapentin  300 mg Oral TID    nicotine patch  7 mg Transdermal Daily    pancrelipase (Lip-Prot-Amyl)  36,000 units of lipase Oral TID w/meals    pantoprazole  40 mg Oral BID    polyethylene glycol  17 g Oral BID    QUEtiapine  150 mg Oral BID    ranolazine  500 mg Oral BID    senna  2 tablet Oral QHS    thiamine  100 mg Oral Daily    vitamin B complex  1 capsule Oral Daily    vitamin D (cholecalciferol)  50 mcg Oral Daily     PRN Medications: albuterol, bisacodyl, calcium carbonate, HYDROmorphone, hyoscyamine, ipratropium-albuterol, naloxone, ondansetron, oxyCODONE, oxyCODONE, sodium chloride, sodium chloride, traZODone      Physical Exam:     Vital signs in last 24 hours:   Temp:  [36.6 ?C (97.9 ?F)-37.1 ?C (98.7 ?F)] 36.7 ?C (98.1 ?F)  Heart Rate:  [59-71] 63  Resp:  [11-21] 17  BP: (105-140)/(65-92) 136/83  NBP Mean:  [78-105] 98  SpO2:  [90 %-97 %] 94 %     I/O last 2 completed shifts:  In: 1190 [P.O.:260; I.V.:930]  Out: 1 [Urine:1]    General: Alert, comfortable, NAD  Head: Atraumatic, normocephalic  Eyes: PERRL. EOM intact. Sclera are anincteric and noninjected.  Mouth/Throat: Mucous membranes moist  Cardiac: RRR, nl S1/S2, no m/r/g, no thrills on palpation.   Lungs: Respiratory effort nl, CTAB, no crackles or wheezes  Abdomen: Normoactive bowel sounds. No masses or organomegaly appreciated though exam. Soft, mildly tender to palpation with minimal voluntary guarding with palpation (improved from prior). Multiple abdominal surgery scars.   Extr: No lower extremity edema, warm and well-perfused.  Skin: Warm and dry. +incisional site in R groin covered with dressing c/d/i  Neuro: A&O x4/4.   Psych: Normal mood and affect      Laboratory Data:     Recent Labs     04/29/23  0645   WBC 5.69   HGB 8.6*   HCT  28.5*   MCV 86.6   PLT 366   NEUTPCT 48.7   NA 139   K 5.0   CL 105   CO2 25   BUN 12   CREAT 1.15   MG 1.6   CALCIUM 9.2       No results for input(s): ''ALT'', ''AST'', ''BILITOT'', ''ALKPHOS'', ''ALBUMIN'' in the last 72 hours.    No results for input(s): ''TSH'', ''HGBA1C'' in the last 72 hours.  No results for input(s): ''CHOL'', ''CHOLHDL'', ''CHOLDLCAL'', ''CHOLDLQ'', ''TRIGLY'' in the last 72 hours.  No results for input(s): ''TROPONIN'', ''BNP'' in the last 72 hours.  Recent Labs     04/29/23  0645   PT 12.6   INR 0.9   APTT 34.9       No results for input(s): ''BACULBLD'', ''BACULUR'', ''BACULRSP'' in the last 72 hours.  No results for input(s): ''GLUCOSEPOC'' in the last 72 hours.    Urinalysis:  Invalid input(s): ''URINEPH'', ''URINEWBC'', ''UAWBC''    Studies:     Imaging:   Cath Lab Peripheral Diagnostic and Interventional   Final Result by Rolley Sims (05/09 1822)      XR kub portable (1 view)   Final Result by Lenise Arena., MD (05/07 1312)   IMPRESSION:      Normal bowel gas pattern.   Large stool burden.   No signs of pneumatosis or pneumoperitoneum.   Right upper quadrant surgical clips. Left upper quadrant anastomotic suture line.            Signed by: Alexia Freestone   04/27/2023 1:12 PM      CT abd+pelvis angiogram wo+w contrast   Final Result by Ellard Artis., MD (05/03 1753)   IMPRESSION:      No active bleed. Near interval resolution of the previously visualized small volume contrast in the terminal ileum and cecum, which may have been transient hemorrhage due to right sided colitis given proximal colonic wall thickening and mucosal    hyperenhancement and/or mild angiodysplasia given apparent serpiginous appearance of the terminal branch of the ileocolic artery. No bowel ischemia.                 Signed by: Verdell Carmine   04/23/2023 5:53 PM      CT abd+pelvis angiogram wo+w contrast   Final Result by Albin Fischer, MD (05/03 1254)   IMPRESSION:         1. 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 Arteries beyond their origin are patent.   2. Circumferential wall thickening in the ascending and proximal transverse colon, suspicious for colitis which may be ischemic or infectious in etiology.   3. High density foci at the inferior cecum and terminal ileal loops, seen on venous phase imaging and not included in the field-of-view on the noncontrast and arterial phase scans, suspicious for extravasation at the cecum with retrograde flow into    distal ileal loops versus extravasation of the distal ileal loops with some antegrade flow. Consider follow-up CT in 6 to 8 hours and/or IR consultation.      These results were discussed with [name]. [place] at 04/23/2023 12:51 PM.               Signed by: Albin Fischer   04/23/2023 12:54 PM          Assessment and Plan:     Donna Gross is  a 57 y.o. female with PMHx of anxiety, depression, emphysema, fibromyalgia, GERD, SMA stenosis who presents with intractable abdominal pain. Likely component of pain is from SMA stenosis. Has been hospitalized many times for similar presentations with negative work-up aside from SMA stenosis. Admitted pending Vascular surgery management to address SMA syndrome, now s/p angiography and stenting of SMA at [facility] on 04/29/23.     #Acute on chronic abdominal pain, POA  #SMA stenosis, chronic, POA  #Celiac stenosis, POA  #CT evidence of CAD, POA  #s/p angio and stenting of SMA on 04/29/23  Pt been suffering with abdominal pain for approx. 1 year. Has been admitted several times previously with similar complaints. Recently admitted for similar presentation but CT imaging now with evidence of CT extravasation in cecum and ileum c/f possible worsening ischemia. In addition SMA further stenosis and now mild celiac stenosis in addition. Previously evaluated by vascular surgery and IR for interventions but those teams not favorable to SMA as culprit of pain given negative lactate. New CT findings raise greater c/f SMA occlusion complications. Reassuringly, short interval repeat CT w/o evidence of further ischemia or bleeding. While lactate remains negative, query intermittent elevation not appreciated on our lab collections. Patients symptoms fit with SMA syndrome- worsening postprandial symptoms, food aversion leading to acute refusal, and abdominal pain. Given clinical stability, no concern for acute bowel ischemia despite increase in lactate on 5/4, pain and PO intolerance likely due to her chronic superior mesenteric stenosis. -Vascular surgery consult, appreciate recs   - Underwent angiography and stenting of SMA on 04/29/23  - Per op note, found high grade stenosis of SMA, placed stent with excellent luminal gain with filling of distal SMA branches following   - CLD ---> advance to regular diet  -pain management consult, appreciate recs  -Pain regimen:  - Tylenol ATC  - Gabapentin 300mg  TID  - Duloxetine 60mg  qday  - Oxycodone sliding scale 5/7.5 q4h PRN  - Dilaudid BT pain 0.5mg  q4h PRN  -pantoprazole 40 mg BID  -calcium carbonate PRN  -DC heparin drip 5/7   -Lovenox for DVT ppx  -continue atrovastatin 40 mg at bedtime     #Diarrhea, chronic, POA, resolved  #Nausea/Vomiting, chronic, POA  #Constipation, acute, POA  Pt with chronic nausea/vomiting/diarrhea over last year. So far work up has included negative lab studies: c.diff, celiac panel, h.pylori, parasitic enteric panel. Pt unable to tolerate colonoscopy and negative EGD. Given CT findings of colitis could query infectious etiology but lack of fever, leukocytosis, and sick contacts with fairly recent workup while with current symptoms negative point against. Query poorly controlled GERD. Also consider pain contributing n/v. Diarrhea has now resolved, patient has now been constipated with last BM 5/4. XR KUB without evidence of ileus, with large stool burden.  - Bowel regimen as below  - continue home hyoscyamine 125 mcg q4hrs PRN  - zofran 4mg  q6hrs     #Poor PO intake, chronic, POA  #Severe protein malnutrition, POA  #Severe muscle loss with cachexia due to severe malnutrition, POA  Previously evaluated by nutrition who had recommended that pt receive boost TID for assistance with malnutrition likely 2/2 poor PO intake from abdominal pain. Reports weight loss of approx 50lbs in the past year. Poor appetite 2/2 pain.   -Regular diet as above  -nutrition consult, appreciate recs  -nutrition supplements TID  -thiamine, vitamin D, vitamin B supplementation    #Normocytic anemia, Chronic POA  Pt presents with hgb of 9.8 c/w her baseline of  9-10. Query anemia 2/2 to nutritional deficincies from poor PO intake. From chart review, no recent vitamin or iron studies. Will order to further characterize. Denies SS of bleeding. Folate/B12 WNL. Iron studies c/f for element of IDA. Given current pain and not wanting to mask diarrhea, will hold on iron supplementation for now but consider when more clinically stable  -daily cbc  -consider iron supplementation as outpatient     #Tobacco use, POA  Previous PPD smoker from age 28 and reduced over last 7 years to now 1 cigarette all day. Requests nicotine patch while IP.   -nicotine patch  -counseled on nicotine cessation     #Anxiety, chronic, POA  #Major Depression, chronic, POA  #Fibromyalgia, chronic, POA   #Insomnia, chronic, POA  Chronic conditions. Will continue home medications.   - Continue Seroquel 150mg  BID  - Duloxetine to 60 mg qday as above  - Trazodone 50 mg at bedtime PRN    #FEN/GI:   - Regular diet  - Bowel regimen:   - Miralax BID   - senna BID   - bisacodyl suppository PRN    #Access: pIV    #Prophylaxis:  - Lovenox as above    #Code: Full Code    Stark Klein, MD who agrees with the above assessment and plan unless otherwise noted in addendum.    Author:  Burnell Blanks, MD 04/30/2023 6:51 AM       FM Attending Addendum:    I have seen and examined the patient, and discussed the plan of care with the resident. I am in agreement with the findings, assessments and treatment plans as stated in this document. Patient is POD 1 s/p stent placement in SMA, with improved abdominal pain today. She tolerated a CLD overnight without emesis. On exam, patient is well appearing, abdomen soft, non-distended, normoactive bowel sounds, no rebound or guarding. Right groin with pressure dressing over cath site, c/d/I, and RLE neurovascularly intact, warm and normal capillary refill. Appreciate Vascular Surgery recommendations, will discharge patient on DAPT with outpatient follow up. Plan for advancing diet as tolerated today and weaning off IV pain medications, likely dispo home tomorrow with HHPT when patient is able to demonstrate she is tolerating PO and pain is controlled with oral medications.     Debbe Mounts, MD

## 2023-04-30 NOTE — Other
Patients Clinical Goal: Clinical Goal(s) for the Shift: VSS, no bleeding at right groin incision site, Pain managed at a tolerable level.  Stability of the patient: Moderately Unstable - medium risk of patient condition declining or worsening    High fall risk factors: Laxative  Primary Language: English  Immunizations Needed: Refused    Other/Significant Events:     Patient received from [facility] PACU. Stent placed in superior mesenteric artery via right groin. Right groin dressing clean, dry and intact with no signs of drainage. Pulses palpable.     2330 - Paged covering resident regarding straight catheterization order. Patient had urine output in less than 1 hour and patient was refusing straight catheter. Covering resident agreed it was not necessary at this time.          Review of Systems  Neuro: Level of Consciousness: WDL Orientation Level: WDL   Resp: O2 Device: None (Room air)   Cardiac:Normal sinus rhythm  GI/Endocrine:Return to WDL Last BM Date: 04/29/23   Stool Appearance: Formed   GU: GU Method: Voiding, External urinary collection device  Ambulatory Status/Limitations:BMAT Level: Level 4 - Green  Skin:   Psychosocial: Psychosocial and/or Care Management Needs: Moderate Frequency   What is most important to patient/family? : Pain management, comfort and sleep.  Was the patient/family goal completed this shift? Yes     Evaluation of Lines/Access  Peripheral IV 20 G Left Antecubital (7)  Peripheral IV 20 G Anterior;Right Forearm (7)  External Urinary Catheter (1)   If PICC, how many cm out?N/A    Procedures/Test/Consults  Done today: superior mesenteric artery stent,   Pending: AM labs     Overview of Vitals/Critical Labs  Pain: 10/10 - PRN oxy and tylenol ATC  Patient Vitals for the past 12 hrs:   BP Temp Temp src Pulse Resp SpO2   04/30/23 0420 136/83 36.7 ?C (98.1 ?F) Oral 63 17 94 %   04/29/23 2356 127/87 -- -- 68 21 (!) 90 %   04/29/23 2214 121/75 37.1 ?C (98.7 ?F) Oral 68 17 93 %   04/29/23 2100 124/78 -- -- 64 16 93 %   04/29/23 2025 126/79 -- -- 65 12 (!) 91 %   04/29/23 2017 -- -- -- 66 14 (!) 90 %   04/29/23 2000 105/85 -- -- 60 14 95 %   04/29/23 1945 -- -- -- 59 11 93 %   04/29/23 1930 112/70 -- -- 60 12 94 %   04/29/23 1915 105/65 -- -- 64 14 94 %     Critical Labs:   Is the patient positive for severe sepsis/septic shock screen?: No    Review of Discharge Planning  Plan/goals: Monitor for bleeding at right groin, monitor peripheral pulses, clear liquid diet, VSS  Anticipated Date of Discharge: 05/01/2023    Anticipated Disposition: Home   Pending BOOST interventions? Yes, please see BOOST Note under Interdisciplinary Notes tab  Discharge support person: Family: children  Update to support person: Support person will visit daily  Barriers:None     Patient Education  New meds (past 24 hrs): See MAR    Does patient meet criteria to watch Fall Prevention Video? If patient doesn't meet criteria, please indicate why. Yes   If patient meets criteria, date patient watched fall prevention video: Pt said she already watched on but does not know which date.    High Risk Medication Teachback: Done  Other teaching: unit orientation, clear liquid diet, monitor pulses and bleeding, pain  management.

## 2023-04-30 NOTE — Discharge Instructions
MESENTERIC ARTERY STENTING    HOME INSTRUCTIONS    DANGER SIGNALS TO WATCH FOR AT HOME  - Signs of infection (fever w/temperature greater than 101.5? F, increased redness, swelling or drainage from the incision)  - Increased pain NOT relieved by pain medication   - Severe abdominal pain or worsening pain after eating meals    If you develop any of these danger signals or have any questions, please contact [phone]. If unavailable call [phone] and ask for the Vascular Surgery Nurse Practitioner (Monday- Friday 7 AM- 3 PM) or Vascular Surgery Resident on Call (available 24 hrs/day) or contact your nearest emergency room.    WHEN YOU CAN RESUME YOUR USUAL ACTIVITIES    WORK: Depending on the type of work you do will determine when you can return to work. Most patients return to work in 1-2 weeks.    DRIVE: Do not drive for until you are off of pain medication. If you are taking pain medication your alertness and reaction time may be slowed making operation of a motor vehicle unsafe.    BATHING/INCISIONAL CARE: You may shower the day after you get home from the hospital. Simply remove the dressing and let the water run over your incision and gently dry afterwards. Have someone nearby to help you if needed. Leave the incision open to air and keep it clean and dry. Do not submerge the incision (No tub bath, soak in Jacuzzi or swimming pool). If Steri-Strips are in place- do not remove, as these will fall off in 5-7 days.     ACTIVITIES: Most patients have minimal pain at the groin access sites, however, it is common to feel fatigued 1-2 weeks after surgery. Do not lift anything greater than 15 pounds until cleared by your physician. You are encouraged to walk and increase your activity as tolerated. It is best to abstain from smoking.    DIET: You may return to your normal diet at home, unless otherwise instructed by a member of the health care team. A low sodium, low cholesterol, low fat diet is recommended for all patients with arterial disease. Prescription pain medication may cause constipation. If constipation occurs, eat fresh fruits and vegetables, high fiber cereal, and drink 6-8 glasses of water each day. If you become constipated, try prune juice, miralax, or milk of magnesia, which is available over the counter.  If the constipation is severe, you may also try an over the counter bisacodyl suppository.    MEDICATIONS: You may take Tylenol as needed for post-operative pain control. STOP taking your apixaban. START taking aspirin 81 mg daily and plavix 75 mg daily. It is important you remain on your cholesterol medication daily as well (statin).    POST-OP APPOINTMENT: Please call [phone] to schedule your first post-op appointment with your surgeon in 4 weeks after being discharged from the hospital. You will have an ultrasound scheduled as well. It is important you FAST the morning of your ultrasound to ensure the best images will be obtained. The location for your appointment will be at the:    [facility], [address]  [address]    SURVEILLANCE:  Following Vascular Surgery procedures, follow up with surveillance imaging is the key to detect recurrent disease and possible complications at an early stage, so they can be managed safely, sometimes before signs and symptoms are present.      A fasting ultrasound is recommended at:  - 1 month post surgery  -  6 months  - Then annually after intervention    Your doctor may discuss more frequent imaging if needed, or if stable, may recommend imaging every other year.

## 2023-04-30 NOTE — Nursing Note
Break Relief RN: Rounded on patient. Patient sleeping at this time, comfortable. IVF infusing. Call light within reach. No needs at this time.

## 2023-04-30 NOTE — Brief Op Note
Brief Operative/Procedure Note    Patient: [name]    Date of Operation(s)/Procedure(s): 04/29/2023    Pre-op Diagnosis: Chronic mesenteric ischemia (HCC/RAF) [K55.1]       Post-op Diagnosis: same as preop    Operation(s)/Procedure(s):  PERIPHERAL INTERVENTION    Surgeon(s) and Role:     * Rigberg, Cliffton Asters., MD - Primary    Anesthesia Staff and Role:  Anesthesia Resident: Francke, [place] A., MD  Anesthesiologist: Hilbert Corrigan, MD; Dorma Russell., MD    Anesthesia Type:   Anesthesia type not filed in the log.    Pre-Op Medications: ancef    Intra-op Medications: (Antibiotics, Anticoagulants, Immunosuppressants)  * Intraprocedure medication information is unavailable because the case start and end events have not been set *    Blood Products: n/a    Fluids: 700 mL  Fluoro time: 26:50  Dose: 2668 mGy  DAP: 18544 cGy.cm2  Contrast: 180 mL    Estimated Blood Loss: Minimal    Findings: High grade stenosis of SMA at ostium status post stenting with 6x22 iCAST stent. Excellent luminal gain with filling of distal SMA branches following.     Palpable DP pulses at conclusion.    Complications: None; patient tolerated the operation(s)/procedure(s) well.                 Specimens:   ID Type Source Tests Collected by Time   A :  Blood Peripheral Vein TYPE AND SCREEN, APPROVED RESEARCH ONLY SAMPLE (AP) Deno Lunger., MD 04/29/2023 1614   B :  Blood Peripheral Vein APPROVED RESEARCH ONLY SAMPLE (AP), CHECK TYPE Deno Lunger., MD 04/29/2023 1732       Drains:    N/a         Staff and Role:   Circulating Nurse: Benita Stabile., RN  Radiology Technologist: Nelida Gores, Elisha Ponder. Eudelia Bunch, MD    Date: 04/29/2023  Time: 6:06 PM

## 2023-04-30 NOTE — Progress Notes
04/30/23 1024   Time Calculation   Start Time 1015   Patient not seen due to Patient deferred treatment   Chart accessed for Treatment scheduling or assignment   Attempted PT/OT co-tx, patient adamantly deferred OOB mobility, stating she is amenable to ambulate at 14:00 today, but not before this time. Plan to follow up later for PT, as schedule permits.

## 2023-04-30 NOTE — Consults
IP CM ACTIVE DISCHARGE PLANNING  Department of Care Coordination      Admit WNUU:[id number]  Anticipated Date of Discharge: 05/01/2023    Following YQ:IHKV, Synetta Fail, MD      Today's short update     Medically active, post angio procedure on yesterday with stent placement. Spoke with patient who confirmed that she is in agreement with Anticipated DC dispo of home with [facility] when clinically cleared.    Disposition     Home with Home Health  [address] [place] [address]  Family/Support System in agreement with the current discharge plan: Yes, in agreement and participating    Freedom of Choice     Treatment Preferences: Addressed  Discharge Goals: Addressed  Freedom of Choice: Educated and Provided, Home Health Agency           Aidin Choice List: Provided to Pt/Family  Date Provided: 04/29/23  Non-medical Transportation Arrangement Status (if applicable)     Patient/family secured    PASRR         Jonnie Kind, BSN, RN  Clinical Case Management  [phone]

## 2023-05-01 LAB — CBC: PLATELET COUNT, AUTO: 365 10*3/uL (ref 143–398)

## 2023-05-01 LAB — Magnesium: MAGNESIUM: 1.9 meq/L (ref 1.4–1.9)

## 2023-05-01 LAB — Basic Metabolic Panel
ANION GAP: 7 mmol/L — ABNORMAL LOW (ref 8–19)
CHLORIDE: 106 mmol/L (ref 96–106)

## 2023-05-01 LAB — Differential Automated: ABSOLUTE NEUT COUNT: 2.85 10*3/uL (ref 1.80–6.90)

## 2023-05-01 LAB — Phosphorus: PHOSPHORUS: 4.1 mg/dL (ref 2.3–4.4)

## 2023-05-01 MED ORDER — DULOXETINE HCL 60 MG PO CPEP
60 mg | ORAL_CAPSULE | Freq: Every day | ORAL | 0 refills | 30.00 days | Status: SS
Start: 2023-05-01 — End: ?

## 2023-05-01 MED ORDER — QUETIAPINE FUMARATE 50 MG PO TABS
50 mg | ORAL_TABLET | Freq: Two times a day (BID) | ORAL | 0 refills | 15.00 days | Status: AC
Start: 2023-05-01 — End: 2023-05-13

## 2023-05-01 MED ORDER — QUETIAPINE FUMARATE 50 MG PO TABS
50 mg | ORAL_TABLET | Freq: Two times a day (BID) | ORAL | 0 refills | 15.00 days
Start: 2023-05-01 — End: 2023-05-02
  Filled 2023-05-02 (×4): qty 30, 15d supply, fill #0

## 2023-05-01 MED ORDER — SENNOSIDES 8.6 MG PO TABS
1 | ORAL_TABLET | Freq: Every evening | ORAL | 0 refills | 60 days | Status: SS | PRN
Start: 2023-05-01 — End: ?
  Filled 2023-05-02: qty 60, 60d supply, fill #0

## 2023-05-01 MED ORDER — QUETIAPINE FUMARATE 100 MG PO TABS
150 mg | ORAL_TABLET | Freq: Two times a day (BID) | ORAL | 0 refills | 15 days | Status: SS
Start: 2023-05-01 — End: ?
  Filled 2023-05-02 (×2): qty 30, 15d supply, fill #0

## 2023-05-01 MED ORDER — CALCIUM CARBONATE ANTACID 500 (200 CA) MG PO CHEW
500 mg | ORAL_TABLET | Freq: Two times a day (BID) | ORAL | 0 refills | 15.00 days | Status: SS | PRN
Start: 2023-05-01 — End: ?

## 2023-05-01 MED ORDER — ONDANSETRON HCL 4 MG PO TABS
4 mg | ORAL_TABLET | Freq: Four times a day (QID) | ORAL | 0 refills | 8.00 days | Status: SS | PRN
Start: 2023-05-01 — End: ?
  Filled 2023-05-02: qty 30, 8d supply, fill #0

## 2023-05-01 MED ORDER — CLOPIDOGREL BISULFATE 75 MG PO TABS
75 mg | ORAL_TABLET | Freq: Every day | ORAL | 0 refills | 30.00000 days | Status: SS
Start: 2023-05-01 — End: ?

## 2023-05-01 MED ORDER — GABAPENTIN 300 MG PO CAPS
300 mg | ORAL_CAPSULE | Freq: Three times a day (TID) | ORAL | 0 refills | 30.00000 days | Status: SS
Start: 2023-05-01 — End: ?

## 2023-05-01 MED ORDER — PEG 3350 17 GM/SCOOP PO POWD
17 g | Freq: Two times a day (BID) | ORAL | 0 refills | 30.00 days | Status: SS
Start: 2023-05-01 — End: ?

## 2023-05-01 MED ORDER — ASPIRIN 81 MG PO CHEW
81 mg | ORAL_TABLET | Freq: Every day | ORAL | 0 refills | 90 days | Status: SS
Start: 2023-05-01 — End: ?

## 2023-05-01 MED ORDER — OXYCODONE-ACETAMINOPHEN 7.5-325 MG PO TABS
1 | ORAL_TABLET | Freq: Four times a day (QID) | ORAL | 0 refills | 8.00000 days | Status: SS | PRN
Start: 2023-05-01 — End: ?

## 2023-05-01 MED ADMIN — FLUTICASONE-SALMETEROL 250-50 MCG/ACT IN AEPB: 1 | RESPIRATORY_TRACT | @ 04:00:00 | Stop: 2023-05-01 | NDC 00173069600

## 2023-05-01 MED ADMIN — GABAPENTIN 300 MG PO CAPS: 300 mg | ORAL | @ 12:00:00 | Stop: 2023-05-01

## 2023-05-01 MED ADMIN — SODIUM CHLORIDE 0.9 % IV BOLUS: 500 mL | INTRAVENOUS | @ 07:00:00 | Stop: 2023-05-01 | NDC 00338004904

## 2023-05-01 MED ADMIN — HYDROMORPHONE HCL 1 MG/ML IJ SOLN: .5 mg | INTRAVENOUS | Stop: 2023-05-01 | NDC 00409128331

## 2023-05-01 MED ADMIN — PANCRELIPASE (LIP-PROT-AMYL) 36000-114000 UNITS PO CPEP: 36000 [IU] | ORAL | @ 15:00:00 | Stop: 2023-05-01 | NDC 00032301613

## 2023-05-01 MED ADMIN — ACETAMINOPHEN 500 MG PO TABS: 1000 mg | ORAL | @ 07:00:00 | Stop: 2023-05-01 | NDC 00904673061

## 2023-05-01 MED ADMIN — RANOLAZINE ER 500 MG PO TB12: 500 mg | ORAL | @ 03:00:00 | Stop: 2023-05-01 | NDC 60687054911

## 2023-05-01 MED ADMIN — POLYETHYLENE GLYCOL 3350 17 G PO PACK: 17 g | ORAL | @ 03:00:00 | Stop: 2023-05-01

## 2023-05-01 MED ADMIN — OXYCODONE HCL 5 MG PO TABS: 7.5 mg | ORAL | @ 15:00:00 | Stop: 2023-05-01 | NDC 68084035411

## 2023-05-01 MED ADMIN — HYDROMORPHONE HCL 1 MG/ML IJ SOLN: .5 mg | INTRAVENOUS | @ 12:00:00 | Stop: 2023-05-01 | NDC 00409128331

## 2023-05-01 MED ADMIN — SODIUM CHLORIDE 0.9 % IV BOLUS: 500 mL | INTRAVENOUS | @ 08:00:00 | Stop: 2023-05-01

## 2023-05-01 MED ADMIN — CHOLESTYRAMINE 4 G PO PACK: 4 g | ORAL | @ 16:00:00 | Stop: 2023-05-01 | NDC 27241013421

## 2023-05-01 MED ADMIN — ASPIRIN 81 MG PO CHEW: 81 mg | ORAL | @ 16:00:00 | Stop: 2023-05-01 | NDC 66553000201

## 2023-05-01 MED ADMIN — ENOXAPARIN SODIUM 40 MG/0.4ML IJ SOSY: 40 mg | SUBCUTANEOUS | @ 16:00:00 | Stop: 2023-05-01 | NDC 71288043382

## 2023-05-01 MED ADMIN — ATORVASTATIN CALCIUM 40 MG PO TABS: 40 mg | ORAL | @ 03:00:00 | Stop: 2023-05-01 | NDC 68084009911

## 2023-05-01 MED ADMIN — GABAPENTIN 300 MG PO CAPS: 300 mg | ORAL | @ 12:00:00 | Stop: 2023-05-01 | NDC 60687059111

## 2023-05-01 MED ADMIN — HYDROMORPHONE HCL 1 MG/ML IJ SOLN: .5 mg | INTRAVENOUS | @ 12:00:00 | Stop: 2023-05-01

## 2023-05-01 MED ADMIN — HYDROMORPHONE HCL 1 MG/ML IJ SOLN: .5 mg | INTRAVENOUS | @ 17:00:00 | Stop: 2023-05-01 | NDC 00409128331

## 2023-05-01 MED ADMIN — THIAMINE HCL 100 MG PO TABS (MULTI GPI): 100 mg | ORAL | @ 16:00:00 | Stop: 2023-05-01 | NDC 77333093425

## 2023-05-01 MED ADMIN — SODIUM CHLORIDE 0.9 % IV SOLN: @ 08:00:00 | Stop: 2023-05-01 | NDC 00338004904

## 2023-05-01 MED ADMIN — NICOTINE 7 MG/24HR TD PT24: 7 mg | TRANSDERMAL | @ 16:00:00 | Stop: 2023-05-01 | NDC 00536589488

## 2023-05-01 MED ADMIN — CHOLECALCIFEROL 25 MCG (1000 UT) PO TABS: 50 ug | ORAL | @ 16:00:00 | Stop: 2023-05-01

## 2023-05-01 MED ADMIN — DULOXETINE HCL 60 MG PO CPEP: 60 mg | ORAL | @ 16:00:00 | Stop: 2023-05-01 | NDC 60687074511

## 2023-05-01 MED ADMIN — ZZ IMS TEMPLATE: 150 mg | ORAL | @ 16:00:00 | Stop: 2023-05-01 | NDC 60687034911

## 2023-05-01 MED ADMIN — SENNOSIDES 8.6 MG PO TABS: 2 | ORAL | @ 03:00:00 | Stop: 2023-05-01 | NDC 00904725261

## 2023-05-01 MED ADMIN — POLYETHYLENE GLYCOL 3350 17 G PO PACK: 17 g | ORAL | @ 16:00:00 | Stop: 2023-05-01 | NDC 60687043199

## 2023-05-01 MED ADMIN — CHOLESTYRAMINE 4 G PO PACK: 4 g | ORAL | @ 13:00:00 | Stop: 2023-05-01 | NDC 27241013421

## 2023-05-01 MED ADMIN — B COMPLEX PO CAPS: 1 | ORAL | @ 16:00:00 | Stop: 2023-05-01

## 2023-05-01 MED ADMIN — CLOPIDOGREL BISULFATE 75 MG PO TABS: 75 mg | ORAL | @ 16:00:00 | Stop: 2023-05-01 | NDC 68084053611

## 2023-05-01 MED ADMIN — OXYCODONE HCL 5 MG PO TABS: 7.5 mg | ORAL | @ 03:00:00 | Stop: 2023-05-01 | NDC 68084035411

## 2023-05-01 MED ADMIN — PANTOPRAZOLE SODIUM 40 MG PO TBEC: 40 mg | ORAL | @ 16:00:00 | Stop: 2023-05-01 | NDC 50268063911

## 2023-05-01 MED ADMIN — ACETAMINOPHEN 500 MG PO TABS: 1000 mg | ORAL | @ 15:00:00 | Stop: 2023-05-01 | NDC 00904673061

## 2023-05-01 MED ADMIN — SODIUM CHLORIDE 0.9 % IV SOLN: @ 07:00:00 | Stop: 2023-05-01 | NDC 00338004904

## 2023-05-01 MED ADMIN — ZZ IMS TEMPLATE: 150 mg | ORAL | @ 03:00:00 | Stop: 2023-05-01 | NDC 60687034911

## 2023-05-01 MED ADMIN — FLUTICASONE-SALMETEROL 250-50 MCG/ACT IN AEPB: 1 | RESPIRATORY_TRACT | @ 16:00:00 | Stop: 2023-05-01 | NDC 00173069600

## 2023-05-01 MED ADMIN — RANOLAZINE ER 500 MG PO TB12: 500 mg | ORAL | @ 16:00:00 | Stop: 2023-05-01 | NDC 60687054911

## 2023-05-01 MED ADMIN — PANCRELIPASE (LIP-PROT-AMYL) 36000-114000 UNITS PO CPEP: 36000 [IU] | ORAL | Stop: 2023-05-01 | NDC 00032301613

## 2023-05-01 MED ADMIN — PANTOPRAZOLE SODIUM 40 MG PO TBEC: 40 mg | ORAL | @ 03:00:00 | Stop: 2023-05-04 | NDC 50268063911

## 2023-05-01 MED ADMIN — GABAPENTIN 300 MG PO CAPS: 300 mg | ORAL | @ 03:00:00 | Stop: 2023-05-01 | NDC 60687059111

## 2023-05-01 NOTE — Other
Patients Clinical Goal: Clinical Goal(s) for the Shift: VSS, pain managed at a tolerable level. SPO2>93%, pt will remain free from falls/injuries.  Stability of the patient: Moderately Unstable - medium risk of patient condition declining or worsening    High fall risk factors: Laxative  Primary Language: English  Immunizations Needed: Refused    Other/Significant Events:     Pt being transferred to room 2440. Report given to receiving RN Duwayne Heck.      Review of Systems  Neuro: Level of Consciousness: WDL Orientation Level: WDL   Resp: O2 Device: None (Room air)   Cardiac:Normal sinus rhythm  GI/Endocrine:Tender, RLQ, LLQ Last BM Date: 04/30/23   Stool Appearance: Loose   GU: GU Method: Voiding  Ambulatory Status/Limitations:BMAT Level: Level 4 - Green  Skin:   Psychosocial: Psychosocial and/or Care Management Needs: Moderate Frequency   What is most important to patient/family? : Pain management  Was the patient/family goal completed this shift? Yes     Evaluation of Lines/Access  Peripheral IV 20 G Left Antecubital (7)  Peripheral IV 20 G Anterior;Right Forearm (7)   If PICC, how many cm out?N/A    Procedures/Test/Consults  Done today: transfer to 2440  Pending: None     Overview of Vitals/Critical Labs  Pain: 10/10 - PRN oxy and dilaudid, and tylenol ATC  Patient Vitals for the past 12 hrs:   BP Temp Temp src Pulse Resp SpO2   04/30/23 2021 112/73 -- -- 76 22 95 %   04/30/23 1918 103/65 36.7 ?C (98.1 ?F) Oral 73 19 97 %   04/30/23 1654 117/77 36.6 ?C (97.9 ?F) -- -- 20 98 %   04/30/23 1549 (!) 81/53 -- -- 71 20 96 %     Critical Labs: none  Is the patient positive for severe sepsis/septic shock screen?: No    Review of Discharge Planning  Plan/goals: Monitor for bleeding at right groin, monitor peripheral pulses, clear liquid diet, VSS  Anticipated Date of Discharge: 05/01/2023    Anticipated Disposition: Home   Pending BOOST interventions? Yes, please see BOOST Note under Interdisciplinary Notes tab  Discharge support person: Family: children  Update to support person: Support person will visit daily  Barriers:None     Patient Education  New meds (past 24 hrs): See MAR    Does patient meet criteria to watch Fall Prevention Video? If patient doesn't meet criteria, please indicate why. Yes   If patient meets criteria, date patient watched fall prevention video: Pt said she already watched on but does not know which date.    High Risk Medication Teachback: Done  Other teaching: unit orientation, monitor pulses and bleeding, pain management.

## 2023-05-01 NOTE — Other
Patient's Clinical Goal:   Clinical Goal(s) for the Shift: VSS, pain mgmt, n/v mgmt, tolerate diet,  Identify possible barriers to advancing the care plan: N/A  Stability of the patient: Moderately Stable - low risk of patient condition declining or worsening   Progression of Patient's Clinical Goal:   Pt alert, 0X4,   VSS, afebrile.   Right groin no bleeding and no hematoma.    Remain on severe pain.  Given oxy and dilaudid.    No nausea and vomiting after adjusted diet.   Ambulated in hall X 3, multiple laps.      Dispo: DC home tomorrow 05/11  Plan: pain mgmt

## 2023-05-01 NOTE — Progress Notes
Physical Therapy  Discharge Summary    PATIENT: Donna Gross  MRN: [id number]  DOB: 03-Nov-1966      Date:  05/01/2023   Therapist: Garner Eckerson, PT          Patient has been seen for:  Bed mobility training;Transfer training;Gait training    Objective     See Daily Progress Notes for functional levels     Patient showing progress in: Bed mobility training;Transfer training;Gait training    Assessment     Goals met: No    Reason Goal(s) Not Met: Patient discharged;Decreased endurance;Weakness;Pain         Goals:  Short Term Goals to be achieved in: 7 days  Pt will perform sit to stand: with stand by assist, with FWW  Pt will ambulate: 51-100 feet, with FWW, with stand by assist  Pt will go up/down stairs: 1-2 stairs, with contact guard assist    Continue present treatment plan: No         Pt discharged from hospital    Updated Discharge Recommendations:  Discharge Recommendation: Physical Therapy;Would benefit from continued therapy  Discharge concerns: Requires supervision for mobility  Discharge Equipment Recommended: Owns recommended DME  Comments: anticipate HHPT upon DC home

## 2023-05-01 NOTE — Progress Notes
Pharmaceutical Services - Discharge Medication Reconciliation and Counseling Note    Patient Name: Donna Gross  Medical Record Number: [id number]  Admit date: 04/23/2023 2:08 PM      Age: 57 y.o.  Sex: female  Allergies: Hydrocodone, Ibuprofen, and Morphine    Preferred Pharmacy:   [id number] [place] - [address] AT corner of [address]  [address] [place] [address]        I reconciled the discharge medications and counseled the patient on the relevant discharge medications. Discharge prescriptions were  picked up by RN . The purpose, potential side effects, storage, missed doses and any special instructions related to each medication was discussed.    Discharge Medication List from AVS:     Changes To My Medications        START taking these medications        Dose Instructions Notes   ASPIRIN LOW DOSE 81 mg chewable tablet  Generic drug: aspirin  Start taking on: May 02, 2023   81 mg   Chew 1 tablet (81 mg total) by mouth daily.      CALCIUM ANTACID 500 mg chewable tablet  Generic drug: calcium carbonate   500 mg   Chew 1 tablet (500 mg total) by mouth two (2) times daily as needed Calcium Carbonate 500 mg is equivalent to 200 mg elemental Calcium.      clopidogrel 75 mg tablet  Commonly known as: Plavix  Start taking on: May 02, 2023   75 mg   Take 1 tablet (75 mg total) by mouth daily.      gabapentin 300 mg capsule  Commonly known as: Neurontin   300 mg   Take 1 capsule (300 mg total) by mouth three (3) times daily.      polyethylene glycol powder  Commonly known as: Glycolax   17 g   Mix 1 capful (17 g) in 6 to 8 oz of liquid and drink by mouth two (2) times daily.             CHANGE how you take these medications        Dose Instructions Notes   DULoxetine 60 mg DR capsule  Commonly known as: Cymbalta  Start taking on: May 02, 2023  What changed:   medication strength  how much to take  when to take this   60 mg   Take 1 capsule (60 mg total) by mouth daily. ondansetron 4 mg tablet  Commonly known as: Zofran  What changed:   medication strength  how much to take   4 mg   Take 1 tablet (4 mg total) by mouth every six (6) hours as needed for Nausea or Vomiting.      oxyCODONE-acetaminophen 7.5-325 MG per tablet  Commonly known as: Percocet  What changed: when to take this  Refill too soon 1 tablet   Take 1 tablet by mouth every six (6) hours as needed for Pain. Max Daily Amount: 4 tablets      * QUEtiapine 100 mg tablet  Commonly known as: SEROquel  What changed: You were already taking a medication with the same name, and this prescription was added. Make sure you understand how and when to take each.   150 mg   Take 1 tablet (100mg  with the other strength for a  total of 150 mg) by mouth two (2) times daily.      * QUEtiapine 50  mg tablet  Commonly known as: SEROquel  What changed: You were already taking a medication with the same name, and this prescription was added. Make sure you understand how and when to take each.   50 mg   Take 1 tablet (50 mg with the other strength for a  total of 150 mg) by mouth two (2) times daily.      * SEROQUEL PO  What changed: Another medication with the same name was added. Make sure you understand how and when to take each.   150 mg   Take 150 mg by mouth two (2) times daily.            * This list has 3 medication(s) that are the same as other medications prescribed for you. Read the directions carefully, and ask your doctor or other care provider to review them with you.                CONTINUE taking these medications        Dose Instructions Notes   * albuterol 90 mcg/act inhaler  Commonly known as: Proventil HFA, Ventolin HFA   2 puff   Inhale 2 puffs every six (6) hours as needed for Wheezing or Shortness of Breath.      * albuterol (2.5 mg/85mL) 0.083% nebulizer solution  Commonly known as: Proventil, Ventolin   2.5 mg   Take 3 mLs (2.5 mg total) by nebulization three (3) times daily as needed for Shortness of Breath. atorvastatin 40 mg tablet  Commonly known as: Lipitor   40 mg   Take 1 tablet (40 mg total) by mouth at bedtime.      cholestyramine 4 g/dose powder  Commonly known as: Questran   4 g   Take 1 packet (4 g total) by mouth three (3) times daily with meals.      clobetasol 0.05% cream  Commonly known as: Temovate    Apply topically three (3) times daily as needed (rash).      CREON 36000 units DR capsule  Generic drug: pancrelipase (Lip-Prot-Amyl)   36,000 units of lipase   Take 1 capsule (36,000 units of lipase total) by mouth three (3) times daily with meals.      cyanocobalamin 1000 mcg tablet  Commonly known as: Cyanocobalamin   1,000 mcg   Take 1 tablet (1,000 mcg total) by mouth daily.      fluticasone-salmeterol 250-50 mcg/act diskus inhaler  Commonly known as: Advair   1 puff   Inhale 1 puff two (2) times daily.      hyoscyamine 0.125 mg tablet  Commonly known as: Levsin   125 mcg   Take 1 tablet (125 mcg total) by mouth every four (4) hours as needed for Cramping.      ipratropium-albuterol 20-100 mcg/act inhaler  Commonly known as: Respimat   2 puff   Inhale 2 puffs four (4) times daily as needed for Wheezing or Shortness of Breath.      NARCAN 4 mg/0.1 mL nasal spray  Generic drug: naloxone    Call 911. Administer a single spray intranasally into one nostril for opioid overdose. May repeat in 3 minutes if patient is not breathing..      pantoprazole 40 mg DR tablet  Commonly known as: Protonix   40 mg   Take 1 tablet (40 mg total) by mouth daily.      prochlorperazine 10 mg tablet  Commonly known as: Compazine   10 mg  Take 1 tablet (10 mg total) by mouth every six (6) hours as needed for Nausea or Vomiting.      ranolazine 500 mg 12 hr tablet  Commonly known as: Ranexa   500 mg   Take 1 tablet (500 mg total) by mouth two (2) times daily.      senna 8.6 mg tablet   1 tablet   Take 1 tablet by mouth at bedtime as needed for Constipation.      vitamin B complex capsule   1 capsule   Take 1 capsule by mouth daily.      Vitamin D3 50 mcg (2000 units) Tabs   50 mcg   Take 1 tablet (50 mcg total) by mouth daily.            * This list has 2 medication(s) that are the same as other medications prescribed for you. Read the directions carefully, and ask your doctor or other care provider to review them with you.                STOP taking these medications        STOP taking these medications   ELIQUIS 5 mg tablet  Generic drug: apixaban                Prescriptions        These medications were sent to [facility] [phone])  [address], [place] [address]      Hours: Mon-Fri 8AM-6PM, Saturdays & Holidays 8AM-5PM (Closed 1PM-2PM for lunch); Closed Sundays Phone: [phone]   ASPIRIN LOW DOSE 81 mg chewable tablet  CALCIUM ANTACID 500 mg chewable tablet  clopidogrel 75 mg tablet  DULoxetine 60 mg DR capsule  gabapentin 300 mg capsule  ondansetron 4 mg tablet  oxyCODONE-acetaminophen 7.5-325 MG per tablet Refill too soon  polyethylene glycol powder  QUEtiapine 100 mg tablet  QUEtiapine 50 mg tablet  senna 8.6 mg tablet         Jacqulyn Bath, 05/01/2023, 3:02 PM

## 2023-05-01 NOTE — Nursing Note
Pt discharged with home meds; discharge lounge picked up pt via wheelchair, brought pt down to lobby. IV's removed per order with catheters intact. Personal belongings with pt. Pts own medicine received from basement pharmacy. AVS printed, reviewed, and given to pt. All questions answered. Pt picked up by her daughter.

## 2023-05-01 NOTE — Other
Patient's Clinical Goal:   Clinical Goal(s) for the Shift: pain mgmt, Vitals signs to remain WNL  Identify possible barriers to advancing the care plan: Chronic abdominal pain, hypotensive eposodes  Stability of the patient: Moderately Stable - low risk of patient condition declining or worsening   Progression of Patient's Clinical Goal: Patient received 1L bolus of NS for hypotension. PRN Dilaudid IVP x1 for this shift.

## 2023-05-01 NOTE — Nursing Note
Dr. Cyndi Bender aware patient BP 84/52 map of 62 after 500 ml bolus of NS, patient denies any symptoms, patient alert no in distress and awake, ordered another 500 ml NS bolus,stated will come and check her in a bit,  500 ml NS bolus done and repeat BP 93/65 map 74, patient still not in distress and denies any symptoms, Dr. Cyndi Bender made aware.

## 2023-05-02 MED FILL — OXYCODONE-ACETAMINOPHEN 7.5-325 MG PO TABS: ORAL | 8 days supply | Qty: 30 | Fill #0

## 2023-05-02 NOTE — Progress Notes
Occupational Therapy  Discharge Summary    PATIENT: Donna Gross  MRN: [id number]  DOB: May 18, 1966      Date:  05/01/2023   Therapist: Linden Dolin, OT          Patient has been seen for:  ADL training;Patient and/or family education;Graded functional activities;Functional balance activities;Functional transfer training    Objective     See Daily Progress Notes for functional levels     Patient showing progress in: Graded functional activities;Functional balance activities;Functional transfer training    Assessment     Goals met: No    Reason Goal(s) Not Met: Patient discharged         Goals:  Short Term Goals to be achieved in: 7 days  Pt will groom self: standing, with supervision  Pt will toilet self: with supervision  Pt will dress upper body: sitting edge of bed, sitting in chair, with supervision  Pt will dress lower body: sitting edge of bed, sitting in chair, with supervision  Pt will perform: stand step transfer, to/from toilet, to/from chair, with supervision    Continue present treatment plan: No              Updated Discharge Recommendations:  Discharge Recommendation: No skilled OT needs upon discharge  Discharge concerns: Requires supervision for mobility;Requires supervision for self care  Discharge Equipment Recommended: Shower Chair

## 2023-05-03 NOTE — Progress Notes
POST OP NOTE/ GENERAL SURGERY / [place] SERVICE [phone]    PATIENT:  Donna Gross  MRN:  [id number]  DOB:  19-Jan-1966  DATE OF SERVICE:  05/03/2023  ATTENDING PHYSICIAN:  Dr. 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     PHYSICAL EXAM:  Vital Signs:     Vitals:    04/23/23 1654   Height: 1.626 m (5' 4.02'')           GENERAL: Comfortable, NAD.  HEENT: Face symmetric. Clear oropharynx   CARDIOVASCULAR: Regular rate and rhythm   PUMONARY:  Normal respiratory excursions, not tachypneic, no labored breathing.   ABD: Non-distended, soft, appropriately tender, incisions with OR dressings c/d/i  EXT: WWP, no c/c/e, bilateral palpable DP  NEURO: A&Ox3, moving all ext.    DETAILED I&O:  I/O last 3 completed shifts:  In: 2880 [P.O.:1800; I.V.:930; IV Piggyback:150]  Out: 2350 [Urine:2350]  No intake or output data in the 24 hours ending 05/03/23 1140     LAB REVIEW:    Recent Labs     05/01/23  0654   HGB 9.0*   HCT 30.2*   PLT 365     No results for input(s): ''APTT'', ''PT'', ''INR'' in the last 72 hours.    Recent Labs     05/01/23  0653   CREAT 1.11   BUN 11   NA 139   K 5.0   CL 106   CO2 26   GLUCOSE 84   CALCIUM 8.8     Recent Labs     05/01/23  0653   MG 1.9   PHOS 4.1     Recent Labs     05/01/23  0653   GLUCOSE 84       No results for input(s): ''TOTPRO'', ''ALBUMIN'' in the last 72 hours.  No results for input(s): ''PREALBUMIN'' in the last 72 hours.   No results for input(s): ''BILITOT'', ''BILICON'', ''AST'', ''ALT'', ''ALKPHOS'', ''LDH'' in the last 72 hours.  No results for input(s): ''INR'' in the last 72 hours.  No results for input(s): ''AMYLASE'', ''LIPASE'' in the last 72 hours.    IMAGING: no new imaging    ASSESSMENT:  Donna Gross is s/p SMA stent with 6x22 iCAST stent. Patient doing well post-op, groin incision is soft, + palpable pedal pulses, and tolerating clears.    PLAN:   - VS per routine  - OK for regular diet  - Apixiban D/Ced and continue ASA 81/ plavix 75.   - Plan for follow-up with Dr. Keene Breath with [place] mesenteric duplex in one month    Please page [phone] (surgery, [place] team) with any questions.    Author:    Katrinka Blazing. Gable Odonohue  Division of General Surgery  [facility]

## 2023-05-04 NOTE — Op Note
DATE OF OPERATION:  04/29/2023     ATTENDING PHYSICIAN:  Jacklynn Barnacle, MD [phone])    PREOPERATIVE DIAGNOSIS:  Abdominal pain and superior mesenteric artery stenosis.    POSTOPERATIVE DIAGNOSIS:  Abdominal pain and superior mesenteric artery stenosis.    NAME OF OPERATION:  1. Ultrasound-guided retrograde puncture of right common femoral artery.   2. Selective angiogram of the right common femoral artery and its bifurcation via a distal right external iliac artery injection.   3. Aortography in both anteroposterior and full lateral positions including mesenteric vessels.   4. Selective cannulization of stenotic superior mesenteric artery with selective angiography of this vessel.   5. Placement of a 6 mm x 2.2 cm Atrium iCast expandable covered stent from the origin of the stenotic superior mesenteric artery distally for roughly 2 cm.   6. Completion angiography and aortography including views of the iliac arteries.   7. Primary closure of the common femoral arteriotomy using a Perclose device.      SURGEON:  Jacklynn Barnacle, MD [phone])      ASSISTANT:  Dr. Curtis Sites    SECOND ASSISTANT:  Dr. [facility] SERVICE:  Vascular Surgery    ANESTHESIA:  General endotracheal.    INDICATIONS:  Donna Gross is a delightful 57 year old woman with a complicated history detailed in the chart of abdominal pain. She has been through a workup including Gastroenterology and other sources for possible postprandial abdominal pain. She has a celiac that does not appear diseased, but then a stenosis of her SMA. After repeated trips to the emergency room and admissions to the hospital, the decision was made to stent this lesion. All the risks and benefits were discussed in detail to her.    DESCRIPTION OF PROCEDURE:  She was taken to the cath lab. She was on antibiotics. She was given a general anesthetic and then prepped and draped in the usual sterile fashion. She had squeezers available for DVT prophylaxis. A time-out was held followed by a final site time-out. We initiated the procedure by identifying her right common femoral artery. This was punctured with a micropuncture needle under direct ultrasound guidance. The position of the wire was confirmed first with ultrasound and then with fluoroscopy. A 4-French micropuncture sheath was placed and through this, we obtained an angiogram by injecting into the distal right external iliac artery. This showed that the right common femoral artery was widely patent. We had a good puncture below the inguinal ligament and above the bifurcation. A Glidewire was placed up into the aorta and we switched out to a 6-French sheath with a side arm. This was flushed with heparinized saline. We passed a VCF catheter into the aorta at roughly the L1 level and performed first an anteroposterior injection. This showed we had good position of our catheter. The aorta was widely patent as were the renals and obviously from this advantage we did not have a good view of the mesenterics. The __________ was then placed in a steep lateral position and we performed a power injection while holding respirations as we did for all of our power shots. This clearly showed that the celiac was widely patent and we could see the orificial lesion, which was heavily calcified of the SMA. The iliacs appeared normal. At this point, we switched out to a 7-French sheath and then switched our wire out to a stiff Glidewire. We then switched out the short 7-French sheath for a 7-French TourGuide. We then gave her  4000 of heparin and kept her ACT between 250 and 300 throughout the case. We viewed the TourGuide to aim the combination of a floppy Glidewire and an angled Glide catheter into the orifice of the SMA and we were able to cannulate it. We gently advanced the Glide cath and obtained a selective angiogram of the SMA. This showed that this was widely patent distally. We advanced the wire and then the catheter and then placed a Rosen wire into the SMA. At this point, we had engaged the orifice of the SMA with the sheath and obviously had removed the inner stylet as well. We brought up a 6 mm x 2.2 cm Atrium iCast stent and positioned this so that it was roughly 2 or 3 mm into the aorta with the remaining portion of it across the lesion and into normal SMA. We backed the sheath out so that the balloon from the covered stent was not in it at all and we performed an angiogram through the sheath, which showed we had good positioning and nothing had moved. We then deployed the iCast by inflating the balloon up to 8 atmospheres with good filling of the stent graft. We took down the balloon and removed it and performed a completion shot. This showed that this was widely patent. We did then perform several more shots evaluating the branch of the SMA and everything looked good. She had been rather hypotensive since getting anesthesia, so we did perform some completion shots as well to make sure there was no sign of any extravasation from either the aorta or the iliacs and there was not. At this point, we were satisfied with the result. We removed the long sheaths and placed a Perclose over the wire. This was tied down without any problems. We had good hemostasis so the wire was removed and we held pressure for a few minutes after tying it down, and then closed the small skin defect with 4-0 Monocryl and placed a dressing. She had palpable pulses in her feet at the conclusion of the case and tolerated it without any problem and woke up from anesthesia with no problems. Please note all the radiographic findings from this procedure are dictated within the body of this report.    COMPLICATIONS:  None.    ESTIMATED BLOOD LOSS:  Minimal.    SPECIMENS:  None.    CONDITION:  Patient tolerated the procedure well.      Jacklynn Barnacle, MD [phone])        DR/MODL CONF#: [id number]  D: 05/03/2023 20:43:00 T: 05/04/2023 05:27:25 DOCUMENT: [id number]

## 2023-05-12 ENCOUNTER — Inpatient Hospital Stay
Admit: 2023-05-12 | Discharge: 2023-05-16 | Disposition: A | Discharge status: Home / Self Care | Payer: PRIVATE HEALTH INSURANCE | Source: Home / Self Care

## 2023-05-12 ENCOUNTER — Ambulatory Visit: Payer: PRIVATE HEALTH INSURANCE

## 2023-05-12 DIAGNOSIS — R634 Abnormal weight loss: Secondary | ICD-10-CM

## 2023-05-12 DIAGNOSIS — R109 Unspecified abdominal pain: Secondary | ICD-10-CM

## 2023-05-12 LAB — Alanine Aminotransferase: ALANINE AMINOTRANSFERASE: 11 U/L (ref 8–70)

## 2023-05-12 LAB — Differential Automated: ABSOLUTE EOS COUNT: 0.06 10*3/uL (ref 0.00–0.50)

## 2023-05-12 LAB — Bilirubin,Conjugated: BILIRUBIN,CONJUGATED: <0.2 mg/dL (ref <=0.3)

## 2023-05-12 LAB — Alkaline Phosphatase: ALKALINE PHOSPHATASE: 87 U/L (ref 37–113)

## 2023-05-12 LAB — Magnesium: MAGNESIUM: 1.5 meq/L (ref 1.4–1.9)

## 2023-05-12 LAB — Creatinine,Random Urine: CREATININE,RANDOM URINE: 132 mg/dL

## 2023-05-12 LAB — Amylase: AMYLASE: 90 U/L (ref 31–124)

## 2023-05-12 LAB — Hgb A1c: HGB A1C: 5.4 (ref <5.7)

## 2023-05-12 LAB — Basic Metabolic Panel
CHLORIDE: 101 mmol/L (ref 96–106)
SODIUM: 136 mmol/L (ref 135–146)

## 2023-05-12 LAB — CBC: HEMOGLOBIN: 12.2 g/dL (ref 11.6–15.2)

## 2023-05-12 LAB — Extra Light Green Top

## 2023-05-12 LAB — Aspartate Aminotransferase: ASPARTATE AMINOTRANSFERASE: 31 U/L (ref 13–62)

## 2023-05-12 LAB — Phosphorus: PHOSPHORUS: 4.1 mg/dL (ref 2.3–4.4)

## 2023-05-12 LAB — Lipase: LIPASE: 27 U/L (ref 13–69)

## 2023-05-12 LAB — Albumin: ALBUMIN: 4.9 g/dL (ref 3.9–5.0)

## 2023-05-12 LAB — Sodium,Random,Ur: SODIUM,RANDOM URINE: 93 mmol/L

## 2023-05-12 MED ADMIN — HEPARIN SODIUM (PORCINE) 5000 UNIT/ML IJ SOLN: 5000 [IU] | SUBCUTANEOUS | @ 21:00:00 | Stop: 2023-05-13 | NDC 00409272330

## 2023-05-12 MED ADMIN — PREGABALIN 25 MG PO CAPS: 25 mg | ORAL | @ 23:00:00 | Stop: 2023-06-12

## 2023-05-12 MED ADMIN — LACTATED RINGERS IV SOLN: 85 mL/h | INTRAVENOUS | @ 23:00:00 | Stop: 2023-05-13 | NDC 00338011704

## 2023-05-12 MED ADMIN — METHOCARBAMOL IVPB: 500 mg | INTRAVENOUS | @ 23:00:00 | Stop: 2023-05-12

## 2023-05-12 MED ADMIN — SIMETHICONE 80 MG PO CHEW: 160 mg | ORAL | @ 19:00:00 | Stop: 2023-05-12 | NDC 77333081225

## 2023-05-12 MED ADMIN — METHOCARBAMOL 500 MG PO TABS: 500 mg | ORAL | @ 23:00:00 | Stop: 2023-05-12 | NDC 60687055911

## 2023-05-12 MED ADMIN — OXYCODONE HCL 5 MG PO TABS: 5 mg | ORAL | @ 21:00:00 | Stop: 2023-05-12 | NDC 68084035411

## 2023-05-12 MED ADMIN — HYOSCYAMINE SULFATE 0.125 MG PO TABS: 125 ug | ORAL | @ 21:00:00 | Stop: 2023-05-16 | NDC 42192034001

## 2023-05-12 MED ADMIN — CHOLESTYRAMINE 4 G PO PACK: 4 g | ORAL | Stop: 2023-05-16

## 2023-05-12 MED ADMIN — ANALGESIC BALM 10-15 % EX CREA: TOPICAL | @ 23:00:00 | Stop: 2023-05-16

## 2023-05-12 MED ADMIN — ACETAMINOPHEN 500 MG PO TABS: 1000 mg | ORAL | @ 23:00:00 | Stop: 2023-05-12

## 2023-05-12 MED ADMIN — HYDROMORPHONE HCL 1 MG/ML IJ SOLN: 1 mg | INTRAVENOUS | @ 19:00:00 | Stop: 2023-05-12 | NDC 00409128331

## 2023-05-12 MED ADMIN — IOHEXOL 350 MG/ML IV SOLN: 100 mL | INTRAVENOUS | @ 18:00:00 | Stop: 2023-05-12 | NDC 00407141491

## 2023-05-12 MED ADMIN — HYDROMORPHONE HCL 1 MG/ML IJ SOLN: 1 mg | INTRAVENOUS | @ 14:00:00 | Stop: 2023-05-12 | NDC 00409128331

## 2023-05-12 MED ADMIN — LIDOCAINE 5 % EX PTCH: 3 | TRANSDERMAL | @ 23:00:00 | Stop: 2023-05-16

## 2023-05-12 MED ADMIN — SODIUM CHLORIDE 0.9 % IV BOLUS: 1000 mL | INTRAVENOUS | @ 14:00:00 | Stop: 2023-05-12 | NDC 00338004904

## 2023-05-12 MED ADMIN — ACETAMINOPHEN 500 MG PO TABS: 1000 mg | ORAL | @ 23:00:00 | Stop: 2023-05-12 | NDC 00904673061

## 2023-05-12 MED ADMIN — ONDANSETRON HCL 4 MG/2ML IJ SOLN: 4 mg | INTRAVENOUS | @ 14:00:00 | Stop: 2023-05-12 | NDC 60505613000

## 2023-05-12 NOTE — H&P
Inpatient History and Physical    PATIENT:  Donna Gross   MRN:  [id number]  DOB:  1966/02/20  DATE OF SERVICE:  05/12/2023    ATTENDING PHYSICIAN: Sammie Bench, MD    SENIOR RESIDENT: Maury Dus, MD    RESIDENT PHYSICIAN: Jari Pigg, MD     PRIMARY CARE PROVIDER: Kelle Darting, MD    REASON FOR ADMISSION:   Chief Complaint   Patient presents with    Abdominal Pain     Patient reports abdominal pain, nausea, vomiting, and diarrhea for four days. Patient reports that she dealt with this earlier this month but it has returned and worsened.        Subjective:     Donna Gross is a 57F with hx of anxiety, depression, emphysema, fibromyalgia, GERD, multiple abdominal surgeries including partial gastrectomy with gastrojejunostomy c/b adhesions, multiple hospitalizations for intractable abdominal pain due to SMA stenosis s/p recent SMA stenting (04/29/2023) here for intractable abdominal pain.    Endorses ongoing similar diffuse constant moderate-severe abdominal pain that radiates to her back which has been ongoing for a year, chronic diarrhea, nausea with intermittent vomiting, poor PO intake, and generalized weakness. Describes the pain as crampy and with a ''pushing'' sensation. Says she has been unable to keep food down. 24-hour recall notable for half a sandwich day prior to admission with tuna which she promptly vomited. Says she has done alright with fluids but not sure how much she has had to drink in the past day. Does not feel that SMA stenting earlier this month made any difference to her symptoms.    Says her last BM this AM was loose and watery, non-bloody. Shows a picture of a stool day prior to admission which was watery with some mucus.     Patient with recent admission (04/23/23 - 05/01/23) for intractable abdominal pain. Was transferred to [facility] for angiography and stenting of SMA with vascular. Patient was discharged on dual antiplatelet therapy (aspirin + plavix).    Denies fevers/chills, CP, dysuria or urinary urgency, hematuria. Endorses intermittent shortness of breathe with exertion which is not normal for her. Used albuterol inhaler this AM which did not help.     Pmhx - anxiety, depression, emphysema, fibromyalgia, GERD, SMA stenosis  Meds - admission med rec  Surghx - cholecystectomy, colectomy for perforated abdomen with colostomy s/p take down, SMA stenting  Sochx:  - lives with daughter  - former one pack per day smoking from 47 yo to 7 years ago. Now at 1 cigarette daily  - denies alcohol  - denies recreational drug use    Review of Systems:  Pertinent items are noted in HPI.     Initial vitals in the ED were:      ED Triage Vitals   Temp Temp Source BP Heart Rate Resp SpO2 O2 Device Pain Score Weight   05/12/23 0639 05/12/23 0639 05/12/23 0639 05/12/23 0639 05/12/23 0639 05/12/23 0639 05/12/23 0730 05/12/23 0640 05/12/23 0640   36.3 ?C (97.3 ?F) Oral 103/68 (!) 101 18 100 % None (Room air) Ten 45.2 kg (99 lb 10.4 oz)       While in the ED, patient was given IV dilaudid 1 mg x2, zofran IV x1, simethicone x1, and 1 L NS bolus.  CTAP with patent SMA stent.     Past Medical History:   Diagnosis Date    Anxiety     Depression     Emphysema, unspecified (HCC/RAF)  Emphysema, unspecified (HCC/RAF)     Fibromyalgia     Gastritis     GERD (gastroesophageal reflux disease)     History of blood transfusion     Intractable nausea and vomiting 01/23/2020    Pancreatitis       Past Surgical History:   Procedure Laterality Date    ABDOMINAL SURGERY      COLON SURGERY        (Not in a hospital admission)    Allergies: Hydrocodone, Ibuprofen, and Morphine  Family History   Problem Relation Age of Onset    Heart disease Mother     Diabetes Brother     Diabetes Father     Anesthesia problems Neg Hx     Malignant hypertension Neg Hx     Hypotension Neg Hx     Malignant hyperthermia Neg Hx     Pseudochol deficiency Neg Hx      Social History     Socioeconomic History    Marital status: Single   Tobacco Use Smoking status: Every Day     Types: Cigarettes    Smokeless tobacco: Current    Tobacco comments:     1 cig per day   Vaping Use    Vaping status: Never Used   Substance and Sexual Activity    Alcohol use: No    Drug use: No   Social History Narrative    Lives w/ mom and daughter     Social Determinants of Health     Financial Resource Strain: Low Risk  (04/28/2023)    Financial Resource Strain     Difficulty of Paying Living Expenses: Not very hard          Objective:     Vital signs in last 24 hours:  Temp:  [36.3 ?C (97.3 ?F)] 36.3 ?C (97.3 ?F)  Heart Rate:  [60-101] 72  Resp:  [16-18] 16  BP: (103-125)/(68-86) 125/76  NBP Mean:  [80-95] 91  SpO2:  [93 %-100 %] 100 %         Most recent blood gas:  No results found for: ''PHART'', ''PCO2ART'', ''PO2ART'', ''BICARBART'', ''BEART''    Intake/Output last 2 shifts:  No intake/output data recorded.    No intake/output data recorded.    Physical Exam:    General:   alert, cachectic, cooperative, Well-appearing, and NAD   Skin:   Skin color, texture, turgor normal. No rashes or lesions   Head:   Normocephalic, without obvious abnormality   Eyes:   PERRL, EOM's intact.   Ears:   Externally normal   Nose:  No drainage   Mouth:   No perioral or gingival cyanosis or lesions.  Tongue is normal in appearance.   Neck:  soft and supple and no anterior or posterior lymph adenopathy   Back:  no CVA tenderness.   Lungs:   clear to auscultation bilaterally, normal repiratory effort, and no wheezes or crackles   Heart:   regular rate and rhythm, S1, S2 normal, no murmur, click, rub or gallop   Abdomen:   Soft, mildly tender to palpation diffusely without rebound or guarding. Multiple surgical scars. No masses or organomegaly appreciated.   Carnett's sign positive (increased tenderness with palpation of abdomen during abdominal wall muscle activation). Absent sensation to light touch over the right lower quadrant and lower part of the right upper quadrant with sensation intact over other quadrants.   GU: Deferred   Extremities:   Warm. Moving all extremities spontaneously.  Neurologic:   Alert and oriented X 3, normal strength and tone. Normal symmetric reflexes. Normal coordination and gait   Psychiatric:   mood and affect are within normal limits, A&O x 4/4          Lines and Drains:  Peripheral IV 22 G Anterior;Left Forearm (Active)         Incision 04/29/23 Anterior;Proximal;Right (Active)       Laboratory Data:   Lab Results   Component Value Date    WBC 9.41 05/12/2023    HGB 12.2 05/12/2023    HCT 38.3 05/12/2023    PLT 599 (H) 05/12/2023     Lab Results   Component Value Date    NA 135 05/12/2023    K 4.6 05/12/2023    CL 98 05/12/2023    CO2 21 05/12/2023    BUN 19 05/12/2023    CREAT 1.37 (H) 05/12/2023    GLUCOSE 83 05/12/2023      No results found for: ''GFR''  Lab Results   Component Value Date    CALCIUM 10.4 05/12/2023     Lab Results   Component Value Date    PHOS 4.1 05/01/2023     Lab Results   Component Value Date    MG 1.9 05/01/2023     Lab Results   Component Value Date    AST 31 05/12/2023    ALT 11 05/12/2023    ALKPHOS 87 05/12/2023    BILITOT 0.2 04/24/2023    BILICON <0.2 05/12/2023    TOTPRO 6.1 04/24/2023    ALBUMIN 4.9 05/12/2023     Lab Results   Component Value Date    AMYLASE 90 05/12/2023    LIPASE 27 05/12/2023     No results for input(s): ''TROPONIN'', ''BNP'' in the last 72 hours.  No results for input(s): ''PT'', ''INR'', ''APTT'' in the last 72 hours.  No results for input(s): ''TSH'', ''HGBA1C'' in the last 72 hours.  No results for input(s): ''CHOL'', ''CHOLHDL'', ''CHOLDLCAL'', ''CHOLDLQ'', ''TRIGLY'' in the last 72 hours.      Microbiology:  @LASTPROC ([id number]])@  No results for input(s): ''BACULBLD'', ''BACULUR'', ''BACULRSP'' in the last 72 hours.    Other Studies:   CT abd+pelvis angiogram wo+w contrast   Final Result by Erle Crocker., MD (05/22 1138)   IMPRESSION:      No evidence of acute abdominopelvic pathology.      Atherosclerosis with patent mesenteric vasculature. Interval placement of superior mesenteric artery stent, which is patent.      Mild residual wall thickening of the ascending and transverse colon, mildly improved since prior examination.            Signed by: Celesta Gentile   05/12/2023 11:38 AM          Assessment & Plan:     Ashani Pumphrey is a 16F with hx of anxiety, depression, emphysema, fibromyalgia, GERD, multiple abdominal surgeries including partial gastrectomy with gastrojejunostomy c/b adhesions, multiple hospitalizations for intractable abdominal pain due to SMA stenosis s/p recent SMA stenting (04/29/2023) here for intractable abdominal pain admitted for IV fluids and pain management.    #acute on chronic abdominal pain, POA  #SMA stenosis, chronic, POA  #celiac stenosis, POA  #CT evidence of CAD, POA  #s/p angio and stenting of SMA of 04/29/23  #multiple abdominal surgeries c/b adhesions, chronic, POA  #GERD, chronic, POA  Pt with long-standing history of abdominal pain for about 1 year. Has been admitted several times previously with similar  complaints. CTAP without evidence of acute abdominopelvic pathology. Low concern for bowel ischemia given lactate negative and SMA stent patent. Query component of dysmotility due to opioid use, though opioid requirement began after onset of chronic symptoms. Low concern for pancreatitis or biliary etiology (s/p cholecystectomy) given normal lab and imaging findings. No evidence of obstruction though has had multiple abdominal surgeries c/b adhesions. For pain, has been on about 45 MMEs at home. Considered more superficial process given positive Carnett's sign though difficult to interpret in this patient. Considered anterior cutaneous nerve entrapment syndrome though does not seem to be localized to horizontal or oblique orientation of nerve distribution. Considered diabetic thoracic polyradiculopathy though no history of diabetes (A1C 5.4% on admission) and no evidence of neuropathy. Overall most likely multifactorial from history of extensive abdominal surgeries, GERD, fibromyalgia, opioid dependence, pancreatic insufficiency, etc.  - pain regimen:        - tylenol 1 g q8hr ATC       - start pregabalin 25 mg BID       - home duloxetine 60 mg qday       - continue home ranolazine 500 mg BID        - topicals: lidocaine patch x3, analgesic balm       - oxycodone SS 5/7.5 q4h PRN       - dilaudid 0.5 mg q4h PRN for BTP       - consider starting buprenorphine, methocarbamol, or ketamine in the AM  - continue home pantoprazole 40 mg daily  - integrative medicine consulted  - E/W medicine consult declined by patient  - consider consult to pain management in the AM  - consider GI consult in the AM   - consider general surgery consult in the AM (history of adhesions)  - continue atorvastatin 40 mg at bedtime  - SMA stenting: continue home aspirin 81 mg daily and plavix 75 mg daily    #diarrhea, chronic, intermittent, POA  #constipation, chronic, intermittent, POA  #nausea/vomiting, chronic, POA  #hx of pancreatic insufficiency, chronic, POA  Pt with chronic n/v/d for the past year. Has had negative work-up including c. Diff, celiac panel, H.pylori testing, parasitic enteric panel. Pt was unable to tolerate colonoscopy and had negative EGD.   - GI consult in the AM  - bowel regimen: miralax BID, senna PRN, bisacodyl suppository PRN  - continue home hyoscyamine 125 mcg QID   - continue home creon 36,000 units of lipase TID  - nausea: zofran 4 mg q8hrs PRN    #AKI, POA, resolved  Creatinine elevated above baseline ~1 up to 1.37 on admission, responded to fluids. Likely secondary to poor PO intake and low volume status. FENa 0.6% on admission, consistent with pre-renal etiology.  - s/p 1 L bolus on admission  - on mIVF LR 85 cc/hr  - trend BMP     #DOE, reported on admission  Reported by patient. No other respiratory s/sx. Low concern for PE in absence of chest pain or respiratory distress. Likely component of anemia, deconditioning, and cachexia.   - PT/OT consulted  - continuous pulse ox  - continuous cardiac monitoring    #Poor PO intake, chronic, POA  #acute weight loss, POA  #severe protein malnutrition, POA   #severe muscle loss with cachexia due to severe malnutrition, POA  Previously evaluated by nutrition who had recommended boost TID for assistance with malnutrition likely 2/2 poor PO intake from abdominal pain. Reports weight loss of approximately 50 lbs in the  last year. Discharged at 104 lbs previously after 10 days down to 99 lbs on this admission (05/12/23). Thiamine wnl. Vitamin D level low at 17 (04/24/2023).   - nutrition consulted, appreciate recs  - boost TID  - continue vitamin D supplementation  - consider initiation of appetite stimulant  - trend labs BID for risk of refeeding syndrome    #normocytic anemia, chronic, POA  Pt presents with hemoglobin 12.2 likely hemo-concentration. Baseline around 9-10. Anemia though to be secondary to nutritional deficiencies from poor PO intake. Folate/B12 wnl (04/2023). Iron studies in 2021 c/f element of IDA, improved on repeat 04/2023.  - trend CBC daily  - consider iron supplementation when more stable    #emphysema, POA  #tobacco use, POA  Previous PPD smoker from age 55 and reduced over last 7 years to 1 cigarette daily.   - nicotine patch 7 mg   - fluticasone-salmeterol inhaler 1 puff BID, albuterol PRN  - counseled on nicotine cessation     #anxiety, chronic, POA   #major depression, chronic, POA  #fibromyalgia, chronic, POA  #insomnia, chronic, POA   All stable. Denies SI/HI.   - continue home seroquel 150 mg BID   - continue home duloxetine 60 mg daily   - continue home trazodone 50 mg qnightly PRN    #FEN/GI:   Clear liquid diet without red items    #Access: pIV     #Prophylaxis: heparin SC    #Code Status: Full Code    #Dispo: pending improvement in PO intake and pain management    Discussed with attending, Sammie Bench, MD., who agrees with the plan above.    Author:  Jari Pigg, MD 05/12/2023 1:23 PM

## 2023-05-12 NOTE — ED Provider Notes
[facility]  Emergency Department Service Report    Donna Gross 57 y.o. female , presents with Abdominal Pain      Triage   Arrived on 05/12/2023 at 6:35 AM   Arrived by Walk-in [14]    ED Triage Vitals   Temp Temp Source BP Heart Rate Resp SpO2 O2 Device Pain Score Weight   05/12/23 0639 05/12/23 0639 05/12/23 0639 05/12/23 0639 05/12/23 0639 05/12/23 0639 05/12/23 0730 05/12/23 0640 05/12/23 0640   36.3 ?C (97.3 ?F) Oral 103/68 (!) 101 18 100 % None (Room air) Ten 45.2 kg (99 lb 10.4 oz)       Pre hospital care:       Allergies   Allergen Reactions    Hydrocodone Other (See Comments)     ''burns a hole in stomach''  Tolerates hydromorphone    Ibuprofen Other (See Comments)     GI discomfort    ''hurts my stomach''    Morphine Itching     Tolerates hydromorphone       History   HPI   Donna Gross is a 57F with hx of GERD, pancreatitis, emphysema, anxiety, depression, fibromyalgia, with multiple prior abdominal surgeries including colectomy for perforated abdomen with take down, lap chole, multiple peptic ulcer surgeries, recent SMA stenting, presenting with abdominal pain.    She has had similar symptoms of diffuse constant moderate-severe intractable abdominal pain radiating to the back for at least a year, along with chronic diarrhea, nausea, poor PO intake, nausea, generalized weakness. Unable to keep food down and immediately gets ''the runs''. Losing weight - at least 55 lbs in past year. Felt that SMA stenting earlier this month did nothing to improve symptoms. No urinary symptoms or family history of GI illness or cancers.    Has been admitted multiple times prior for similar symptoms including 04/2023, 03/2023, 11/2022 and 06/2022; definitive diagnosis not yet clear. Prior workup has included EGD, H. Pylori, c. Diff, parasite enteric pathogen panel. No colonoscopy yet - was unable to tolerate prep.    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                 Past Social History   she reports that she has been smoking cigarettes. She uses smokeless tobacco. She reports that she does not drink alcohol and does not use drugs. No history on file for sexual activity.       Physical Exam   Physical Exam  Vitals and nursing note reviewed.   Constitutional:       General: She is not in acute distress.     Appearance: She is well-developed. She is ill-appearing.  HENT:      Head: Normocephalic and atraumatic.   Eyes:      Conjunctiva/sclera: Conjunctivae normal.   Cardiovascular:      Rate and Rhythm: Tachycardia present.   Pulmonary:      Effort: Pulmonary effort is normal. No respiratory distress.      Breath sounds: Normal breath sounds.   Abdominal:      General: Abdomen is flat. Bowel sounds are normal.      Palpations: Abdomen is soft.      Tenderness: There is abdominal tenderness. There is no guarding or rebound.   Musculoskeletal:         General: Normal range of motion.      Cervical back: Normal range of motion and neck supple.   Lymphadenopathy:      Cervical: No cervical adenopathy.   Skin:     General: Skin is warm and dry.   Neurological:      Mental Status: She is alert.      Sensory: No sensory deficit.      Comments: Moving all four extremities.   Psychiatric:         Behavior: Behavior normal.         ED Course     ED Course as of 05/12/23 1016   Wed May 12, 2023   1016 10:16 AM Consult with hospitalist. Spoke about the patient's presenting symptoms and exam findings. They recommend admission to their service at this time.      [SA]      ED Course User Index  [SA] Sarajane Marek       Laboratory Results     Labs Reviewed   BASIC METABOLIC PANEL - Abnormal; Notable for the following components:       Result Value    Creatinine 1.37 (*)     All other components within normal limits   CBC (PERFORMABLE) - Abnormal; Notable for the following components:    Red Cell Distribution Width-SD 50.5 (*)     Red Cell Distribution Width-CV 16.6 (*)     Platelet Count, Auto 599 (*)     Mean Platelet Volume 8.3 (*)     All other components within normal limits   ALT (SGPT) - Normal   AST (SGOT) - Normal   BILIRUBIN,CONJ - Normal   ALKALINE PHOSPHATASE - Normal   ALBUMIN - Normal   LIPASE - Normal   AMYLASE - Normal   RAINBOW DRAW TO LABORATORY    Narrative:     The following orders were created for panel order [facility] Draw to Laboratory Clinton Gallant).  Procedure                               Abnormality         Status                     ---------                               -----------         ------                     Extra Burna Mortimer ZOX[[id number]]  Final result                 Please view results for these tests on the individual orders.   CBC & AUTO DIFFERENTIAL    Narrative:     The following orders were created for panel order CBC & Plt & Diff.  Procedure                               Abnormality         Status                     ---------                               -----------         ------                     JXB[[id number]]                          Abnormal            Final result               Differential, Automated[[id number]]                          Final result                 Please view results for these tests on the individual orders.   EXTRA LIGHT GREEN TOP   DIFFERENTIAL, AUTOMATED (PERFORMABLE)       Imaging Results     No orders to display       Administered Medications     Medication Administration from 05/12/2023 0636 to 05/12/2023 1016         Date/Time Order Dose Route Action Action by Comments     05/12/2023 0846 PDT sodium chloride 0.9% IV soln bolus 1,000 mL 0 mL Intravenous Stopped Craig Staggers, RN --     05/12/2023 [phone] PDT sodium chloride 0.9% IV soln bolus 1,000 mL 1,000 mL Intravenous New Bag/ Syringe/ Cartridge Yves Dill, RN --     05/12/2023 0701 PDT ondansetron 4 mg/2 mL inj 4 mg 4 mg Intravenous Given Yves Dill, RN --     05/12/2023 [phone] PDT HYDROmorphone 1 mg/mL inj 1 mg 1 mg IV Push Given Craig Staggers, RN --            Procedures   Procedural Sedation  Procedures    Medical Decision Making   Donna Gross is a 57 y.o. female with hx of GERD, pancreatitis, emphysema, anxiety, depression, fibromyalgia, with multiple prior abdominal surgeries including colectomy for perforated abdomen with take down, lap chole, multiple peptic ulcer surgeries, recent SMA stenting, presenting with intractable, chronic abdominal pain, diarrhea, nausea, vomiting, poor PO intake, and continued weight loss.    Differential remains broad and includes pancreatitis, gastritis, IBD, malignancy (e.g. neuroendocrine secreting tumor), SIBO or other infection. Given symptoms unimproved after recent SMA stenting, unlikely that chronic mesenteric ischemia was cause of symptoms. Given complex history, would recommend admission for continued workup. Will order CT imaging in ED, fluids for mild AKI (likely dehydration iso poor PO intake), and dilaudid for pain control.    Medical Decision Making  Amount and/or Complexity of Data Reviewed  Labs: ordered.  Radiology: ordered.    Risk  Prescription drug management.  Decision regarding hospitalization.      Clinical Impression        1. Intractable abdominal pain    2. Weight loss, abnormal          Prescriptions     New Prescriptions    No medications on file       Disposition and Follow-up   Disposition: Admit [3]    Future Appointments   Date Time Provider Department Center   05/28/2023 10:00 AM [facility] [place] [facility] [place]/Cen   05/31/2023 12:15 PM Rigberg, Cliffton Asters., MD [facility] [address] [place]/Cen       Follow up with:  No follow-up provider specified.    Return precautions are specified on After Visit Summary.    Note prepared by:  Lesly Dukes, MS3  [facility], [place] Medical Student    This note should not be consulted for clinical, billing, or medico-legal purposes unless a physician has reviewed this note and added an attestation below regarding the accuracy of above documentation.    Scribe Signature   I, Sarajane Marek, have acted as a Stage manager for [employer] on behalf of Dr. Bernadene Bell at 05/12/2023 10:16 AM. All documentation underwent a comprehensive review by the listed physician(s) and received their approval upon signing.

## 2023-05-12 NOTE — ED Notes
Med requested from pharmacy.

## 2023-05-12 NOTE — Progress Notes
Skin checked performed with Trula Ore RN. Pt skin intact. Old surgical abdominal scars present

## 2023-05-13 LAB — Basic Metabolic Panel: UREA NITROGEN: 14 mg/dL (ref 7–22)

## 2023-05-13 LAB — HS Troponin I (Reflexed): HIGH SENSITIVITY TROPONIN I: 10 ng/L — ABNORMAL HIGH (ref <5)

## 2023-05-13 LAB — Magnesium: MAGNESIUM: 1.4 meq/L (ref 1.4–1.9)

## 2023-05-13 LAB — Phosphorus: PHOSPHORUS: 3.6 mg/dL (ref 2.3–4.4)

## 2023-05-13 LAB — CBC: ABSOLUTE NUCLEATED RBC COUNT: 0 10*3/uL (ref 0.00–0.00)

## 2023-05-13 LAB — HS Troponin I + (3hr repeat): HIGH SENSITIVITY TROPONIN I: 9 ng/L — ABNORMAL HIGH (ref <5)

## 2023-05-13 MED ADMIN — HYDROMORPHONE HCL 1 MG/ML IJ SOLN: .5 mg | INTRAVENOUS | @ 22:00:00 | Stop: 2023-05-14 | NDC 00409128331

## 2023-05-13 MED ADMIN — HYDROMORPHONE HCL 1 MG/ML IJ SOLN: .5 mg | INTRAVENOUS | @ 07:00:00 | Stop: 2023-05-14 | NDC 00409128331

## 2023-05-13 MED ADMIN — FLUTICASONE-SALMETEROL 250-50 MCG/ACT IN AEPB: 1 | RESPIRATORY_TRACT | @ 05:00:00 | Stop: 2023-05-20

## 2023-05-13 MED ADMIN — CLOPIDOGREL BISULFATE 75 MG PO TABS: 75 mg | ORAL | @ 16:00:00 | Stop: 2023-06-12 | NDC 68084053611

## 2023-05-13 MED ADMIN — PANTOPRAZOLE SODIUM 40 MG PO TBEC: 40 mg | ORAL | @ 16:00:00 | Stop: 2023-05-16 | NDC 50268063911

## 2023-05-13 MED ADMIN — TRAZODONE HCL 25 MG PO TABS: 50 mg | ORAL | @ 04:00:00 | Stop: 2023-06-11

## 2023-05-13 MED ADMIN — PANCRELIPASE (LIP-PROT-AMYL) 36000-114000 UNITS PO CPEP: 36000 [IU] | ORAL | @ 01:00:00 | Stop: 2023-06-12 | NDC 00032301613

## 2023-05-13 MED ADMIN — PREGABALIN 25 MG PO CAPS: 25 mg | ORAL | @ 16:00:00 | Stop: 2023-05-16 | NDC 60687047311

## 2023-05-13 MED ADMIN — CHOLESTYRAMINE 4 G PO PACK: 4 g | ORAL | @ 16:00:00 | Stop: 2023-05-16 | NDC 27241013421

## 2023-05-13 MED ADMIN — POLYETHYLENE GLYCOL 3350 17 G PO PACK: 17 g | ORAL | @ 16:00:00 | Stop: 2023-05-25

## 2023-05-13 MED ADMIN — HYOSCYAMINE SULFATE 0.125 MG PO TABS: 125 ug | ORAL | Stop: 2023-05-16 | NDC 42192034001

## 2023-05-13 MED ADMIN — ANALGESIC BALM 10-15 % EX CREA: TOPICAL | Stop: 2023-06-11

## 2023-05-13 MED ADMIN — OXYCODONE HCL 5 MG PO TABS: 7.5 mg | ORAL | @ 15:00:00 | Stop: 2023-05-16 | NDC 68084035411

## 2023-05-13 MED ADMIN — NICOTINE 7 MG/24HR TD PT24: 7 mg | TRANSDERMAL | @ 01:00:00 | Stop: 2023-06-11

## 2023-05-13 MED ADMIN — NICOTINE 7 MG/24HR TD PT24: 7 mg | TRANSDERMAL | @ 16:00:00 | Stop: 2023-05-16 | NDC 00536589488

## 2023-05-13 MED ADMIN — RANOLAZINE ER 500 MG PO TB12: 500 mg | ORAL | @ 12:00:00 | Stop: 2023-05-16 | NDC 60687054911

## 2023-05-13 MED ADMIN — HYOSCYAMINE SULFATE 0.125 MG PO TABS: 125 ug | ORAL | @ 16:00:00 | Stop: 2023-05-16 | NDC 42192034001

## 2023-05-13 MED ADMIN — CALCIUM CARBONATE ANTACID 500 (200 CA) MG PO CHEW: 500 mg | ORAL | @ 02:00:00 | Stop: 2023-05-16 | NDC 68084098833

## 2023-05-13 MED ADMIN — OXYCODONE HCL 5 MG PO TABS: 7.5 mg | ORAL | @ 06:00:00 | Stop: 2023-05-16 | NDC 68084035411

## 2023-05-13 MED ADMIN — HYOSCYAMINE SULFATE 0.125 MG PO TABS: 125 ug | ORAL | @ 02:00:00 | Stop: 2023-05-16

## 2023-05-13 MED ADMIN — LIDOCAINE 5 % EX PTCH: 3 | TRANSDERMAL | @ 20:00:00 | Stop: 2023-06-11

## 2023-05-13 MED ADMIN — ATORVASTATIN CALCIUM 20 MG PO TABS: 40 mg | ORAL | @ 03:00:00 | Stop: 2023-05-16 | NDC 00904629161

## 2023-05-13 MED ADMIN — ZZ IMS TEMPLATE: 150 mg | ORAL | @ 03:00:00 | Stop: 2023-05-16 | NDC 60687034911

## 2023-05-13 MED ADMIN — HYOSCYAMINE SULFATE 0.125 MG PO TABS: 125 ug | ORAL | @ 20:00:00 | Stop: 2023-06-11 | NDC 42192034001

## 2023-05-13 MED ADMIN — MAGNESIUM SULFATE 4 GM/100ML IV SOLN: 4 g | INTRAVENOUS | @ 15:00:00 | Stop: 2023-05-13 | NDC 63323010601

## 2023-05-13 MED ADMIN — ANALGESIC BALM 10-15 % EX CREA: TOPICAL | @ 15:00:00 | Stop: 2023-05-16

## 2023-05-13 MED ADMIN — HYDROMORPHONE HCL 1 MG/ML IJ SOLN: .5 mg | INTRAVENOUS | @ 16:00:00 | Stop: 2023-05-14 | NDC 00409128331

## 2023-05-13 MED ADMIN — ANALGESIC BALM 10-15 % EX CREA: TOPICAL | @ 20:00:00 | Stop: 2023-05-16

## 2023-05-13 MED ADMIN — VITAMIN B-12 500 MCG PO TABS: 1000 ug | ORAL | @ 16:00:00 | Stop: 2023-06-12 | NDC 50268085415

## 2023-05-13 MED ADMIN — PANCRELIPASE (LIP-PROT-AMYL) 36000-114000 UNITS PO CPEP: 36000 [IU] | ORAL | @ 20:00:00 | Stop: 2023-05-16 | NDC 00032301613

## 2023-05-13 MED ADMIN — CALCIUM CARBONATE ANTACID 500 (200 CA) MG PO CHEW: 500 mg | ORAL | @ 02:00:00 | Stop: 2023-05-16

## 2023-05-13 MED ADMIN — HEPARIN SODIUM (PORCINE) 5000 UNIT/ML IJ SOLN: 5000 [IU] | SUBCUTANEOUS | @ 03:00:00 | Stop: 2023-05-16 | NDC 00409272330

## 2023-05-13 MED ADMIN — OXYCODONE HCL 5 MG PO TABS: 7.5 mg | ORAL | @ 10:00:00 | Stop: 2023-05-20 | NDC 68084035411

## 2023-05-13 MED ADMIN — B COMPLEX PO CAPS: 1 | ORAL | @ 16:00:00 | Stop: 2023-05-16 | NDC 00536137801

## 2023-05-13 MED ADMIN — ACETAMINOPHEN 500 MG PO TABS: 1000 mg | ORAL | @ 12:00:00 | Stop: 2023-05-16 | NDC 00904673061

## 2023-05-13 MED ADMIN — DULOXETINE HCL 30 MG PO CPEP: 60 mg | ORAL | @ 01:00:00 | Stop: 2023-05-16 | NDC 60687073411

## 2023-05-13 MED ADMIN — ACETAMINOPHEN 500 MG PO TABS: 1000 mg | ORAL | @ 20:00:00 | Stop: 2023-05-16 | NDC 00904673061

## 2023-05-13 MED ADMIN — ASPIRIN 81 MG PO CHEW: 81 mg | ORAL | @ 16:00:00 | Stop: 2023-05-16 | NDC 66553000201

## 2023-05-13 MED ADMIN — HEPARIN SODIUM (PORCINE) 5000 UNIT/ML IJ SOLN: 5000 [IU] | SUBCUTANEOUS | @ 16:00:00 | Stop: 2023-05-16 | NDC 00409272330

## 2023-05-13 MED ADMIN — ONDANSETRON HCL 4 MG/2ML IJ SOLN: 4 mg | INTRAVENOUS | @ 10:00:00 | Stop: 2023-05-15 | NDC 60505613000

## 2023-05-13 MED ADMIN — HYOSCYAMINE SULFATE 0.125 MG PO TABS: 125 ug | ORAL | @ 04:00:00 | Stop: 2023-05-16 | NDC 42192034001

## 2023-05-13 MED ADMIN — LACTATED RINGERS IV SOLN: 85 mL/h | INTRAVENOUS | @ 11:00:00 | Stop: 2023-05-13 | NDC 00338011704

## 2023-05-13 MED ADMIN — CHOLESTYRAMINE 4 G PO PACK: 4 g | ORAL | @ 04:00:00 | Stop: 2023-05-16 | NDC 68382052860

## 2023-05-13 MED ADMIN — HYDROMORPHONE HCL 1 MG/ML IJ SOLN: .5 mg | INTRAVENOUS | @ 01:00:00 | Stop: 2023-05-14 | NDC 00409128331

## 2023-05-13 MED ADMIN — CHOLECALCIFEROL 25 MCG (1000 UT) PO TABS: 50 ug | ORAL | @ 16:00:00 | Stop: 2023-05-28

## 2023-05-13 MED ADMIN — RANOLAZINE ER 500 MG PO TB12: 500 mg | ORAL | @ 01:00:00 | Stop: 2023-05-16

## 2023-05-13 MED ADMIN — PANCRELIPASE (LIP-PROT-AMYL) 36000-114000 UNITS PO CPEP: 36000 [IU] | ORAL | @ 15:00:00 | Stop: 2023-05-16 | NDC 00032301613

## 2023-05-13 MED ADMIN — DULOXETINE HCL 30 MG PO CPEP: 60 mg | ORAL | @ 16:00:00 | Stop: 2023-05-16 | NDC 60687073411

## 2023-05-13 MED ADMIN — FLUTICASONE-SALMETEROL 250-50 MCG/ACT IN AEPB: 1 | RESPIRATORY_TRACT | @ 15:00:00 | Stop: 2023-05-16 | NDC 00054032756

## 2023-05-13 MED ADMIN — POLYETHYLENE GLYCOL 3350 17 G PO PACK: 17 g | ORAL | @ 04:00:00 | Stop: 2023-05-16

## 2023-05-13 MED ADMIN — OXYCODONE HCL 5 MG PO TABS: 5 mg | ORAL | @ 20:00:00 | Stop: 2023-05-16 | NDC 68084035411

## 2023-05-13 MED ADMIN — ANALGESIC BALM 10-15 % EX CREA: TOPICAL | @ 04:00:00 | Stop: 2023-06-11

## 2023-05-13 MED ADMIN — ZZ IMS TEMPLATE: 150 mg | ORAL | @ 16:00:00 | Stop: 2023-05-16 | NDC 60687034911

## 2023-05-13 MED ADMIN — HYDROMORPHONE HCL 1 MG/ML IJ SOLN: .5 mg | INTRAVENOUS | @ 11:00:00 | Stop: 2023-05-20 | NDC 00409128331

## 2023-05-13 MED ADMIN — MAGNESIUM SULFATE 2 GM/50ML IV SOLN: 2 g | INTRAVENOUS | @ 17:00:00 | Stop: 2023-05-13 | NDC 70121171901

## 2023-05-13 NOTE — Progress Notes
Inpatient Family Medicine Progress Note    Primary Care Provider: Kelle Darting, MD  Attending Physician: Sammie Bench, MD  Junior Resident Physician: Burnell Blanks, MD    Chief complaint:   Chief Complaint   Patient presents with    Abdominal Pain     Patient reports abdominal pain, nausea, vomiting, and diarrhea for four days. Patient reports that she dealt with this earlier this month but it has returned and worsened.      Subjective:     Overnight events:  - NAEON                     This AM:  - Patient endorsing continuing abdominal pain, nausea, and diarrhea. Says the pain is ''different'' from her previous hospitalization  - Says that she has lost weight since she was discharged because she hasn't been able to tolerate PO    Pt denies any fevers, chills, constipation, sob, or chest pain.    PRNs: Dilaudid IV x4, Oxy 7.5mg  x3, Zofran x1    Medications:     Continuous Medications:    lactated ringers 85 mL/hr (05/13/23 0416)     Scheduled Medications:     acetaminophen  1,000 mg Oral TID    analgesic balm   Topical QID    aspirin  81 mg Oral Daily    atorvastatin  40 mg Oral QHS    cholestyramine  4 g Oral TID w/meals & QHS    clopidogrel  75 mg Oral Daily    cyanocobalamin  1,000 mcg Oral Daily    DULoxetine  60 mg Oral Daily    fluticasone-salmeterol  1 puff Inhalation BID    heparin  5,000 Units Subcutaneous BID    hyoscyamine  125 mcg Oral QID    lidocaine  3 patch Transdermal Q24H    magnesium sulfate in water for injection  4 g Intravenous Once    Followed by    magnesium sulfate in water for injection  2 g Intravenous Once    nicotine patch  7 mg Transdermal Daily    pancrelipase (Lip-Prot-Amyl)  36,000 units of lipase Oral TID w/meals    pantoprazole  40 mg Oral Daily    polyethylene glycol  17 g Oral BID    pregabalin  25 mg Oral BID    QUEtiapine  150 mg Oral BID    ranolazine  500 mg Oral Q12H    vitamin B complex  1 capsule Oral Daily    cholecalciferol  50 mcg Oral Daily     PRN Medications: albuterol, calcium carbonate, HYDROmorphone, melatonin oral/enteral/sublingual, naloxone, ondansetron **OR** ondansetron injection/IVPB, oxyCODONE **OR** oxyCODONE, senna, traZODone      Physical Exam:     Vital signs in last 24 hours:   Temp:  [36.4 ?C (97.6 ?F)-36.9 ?C (98.4 ?F)] 36.7 ?C (98.1 ?F)  Heart Rate:  [60-81] 71  Resp:  [16-18] 18  BP: (91-138)/(66-97) 95/67  NBP Mean:  [75-107] 76  SpO2:  [93 %-100 %] 96 %     I/O last 2 completed shifts:  In: 796.9 [P.O.:600; I.V.:196.9]  Out: -     General: Alert, comfortable, thin-appearing, NAD  Head: Atraumatic, normocephalic  Eyes: PERRL. EOM intact. Sclera are anincteric and noninjected.  Mouth/Throat: Mucous membranes moist  Cardiac: RRR, nl S1/S2, no m/r/g, no thrills on palpation.   Lungs: Respiratory effort nl, CTAB, no crackles or wheezes  Abdomen: soft, non-distended. Diffusely tender to palpation without rebound or  guarding. Multiple surgical scars. + voluntary guarding of abdomen  Extr: No lower extremity edema, warm and well-perfused.  Skin: Warm and dry. No significant rashes or lesions noted.  Neuro: A&O x4/4.   Psych: Normal mood and affect      Laboratory Data:     Recent Labs     05/13/23  0454 05/12/23  1331 05/12/23  0658   WBC 5.79  --  9.41   HGB 9.1*  --  12.2   HCT 28.4*  --  38.3   MCV 84.0  --  84.0   PLT 436*  --  599*   NEUTPCT  --   --  73.0   NA 135   < > 135   K 4.3   < > 4.6   CL 103   < > 98   CO2 21   < > 21   BUN 14   < > 19   CREAT 0.99   < > 1.37*   MG 1.4  --  1.5   CALCIUM 9.1   < > 10.4    < > = values in this interval not displayed.     Recent Labs     05/12/23  0658   ALT 11   AST 31   ALKPHOS 87   ALBUMIN 4.9     Recent Labs     05/12/23  0658   HGBA1C 5.4     No results for input(s): ''CHOL'', ''CHOLHDL'', ''CHOLDLCAL'', ''CHOLDLQ'', ''TRIGLY'' in the last 72 hours.  Recent Labs     05/12/23  2005 05/12/23  1331   TROPONIN 10* 9*     No results for input(s): ''PT'', ''INR'', ''APTT'' in the last 72 hours.  No results for input(s): ''BACULBLD'', ''BACULUR'', ''BACULRSP'' in the last 72 hours.  No results for input(s): ''GLUCOSEPOC'' in the last 72 hours.    Urinalysis:  Invalid input(s): ''URINEPH'', ''URINEWBC'', ''UAWBC''    Studies:     Imaging:   CT abd+pelvis angiogram wo+w contrast   Final Result by Erle Crocker., MD (05/22 1138)   IMPRESSION:      No evidence of acute abdominopelvic pathology.      Atherosclerosis with patent mesenteric vasculature. Interval placement of superior mesenteric artery stent, which is patent.      Mild residual wall thickening of the ascending and transverse colon, mildly improved since prior examination.            Signed by: Celesta Gentile   05/12/2023 11:38 AM          Assessment and Plan:   Donna Gross is a 16F with hx of anxiety, depression, emphysema, fibromyalgia, GERD, multiple abdominal surgeries including partial gastrectomy with gastrojejunostomy c/b adhesions, multiple hospitalizations for intractable abdominal pain due to SMA stenosis s/p recent SMA stenting (04/29/2023) here for intractable abdominal pain admitted for pain management.     #acute on chronic abdominal pain, POA  #SMA stenosis, chronic, POA  #celiac stenosis, POA  #CT evidence of CAD, POA  #s/p angio and stenting of SMA of 04/29/23  #multiple abdominal surgeries c/b adhesions, chronic, POA  #GERD, chronic, POA  Pt with long-standing history of abdominal pain for about 1 year. Has been admitted several times previously with similar complaints. CTAP without evidence of acute abdominopelvic pathology. Low concern for bowel ischemia given lactate negative and SMA stent patent. Query component of dysmotility due to opioid use, though opioid requirement began after onset of chronic symptoms. Low concern for pancreatitis  or biliary etiology (s/p cholecystectomy) given normal lab and imaging findings. No evidence of obstruction though has had multiple abdominal surgeries c/b adhesions. For pain, has been on about 45 MMEs at home. Considered more superficial process given positive Carnett's sign though difficult to interpret in this patient. Considered anterior cutaneous nerve entrapment syndrome though does not seem to be localized to horizontal or oblique orientation of nerve distribution. Considered diabetic thoracic polyradiculopathy though no history of diabetes (A1C 5.4% on admission) and no evidence of neuropathy. Overall most likely multifactorial from history of extensive abdominal surgeries, GERD, fibromyalgia, opioid dependence, pancreatic insufficiency, etc.  - GI consulted, appreciate recs  - Vascular surgery consulted, appreciate recs  - s/p fluid resuscitation, discontinued fluids  - ADAT, advanced to regular diet  - pain regimen:        - tylenol 1 g q8hr ATC       - pregabalin 25 mg BID       - home duloxetine 60 mg qday       - continue home ranolazine 500 mg BID        - topicals: lidocaine patch x3, analgesic balm       - oxycodone SS 5/7.5 q4h PRN       - dilaudid 0.5 mg q4h PRN for BTP       - pain management consult, appreciate recs  - continue home pantoprazole 40 mg daily  - integrative medicine consulted, appreciate recs  - E/W medicine consult declined by patient  - continue atorvastatin 40 mg at bedtime  - s/p SMA stenting: continue home aspirin 81 mg daily and plavix 75 mg daily     #diarrhea, chronic, intermittent, POA  #constipation, chronic, intermittent, POA  #nausea/vomiting, chronic, POA  #hx of pancreatic insufficiency, chronic, POA  Pt with chronic n/v/d for the past year. Has had negative work-up including c. Diff, celiac panel, H.pylori testing, parasitic enteric panel. Pt was unable to tolerate colonoscopy and had negative EGD.   - GI consult as above  - bowel regimen: miralax BID, senna PRN, bisacodyl suppository PRN  - continue home hyoscyamine 125 mcg QID   - continue home creon 36,000 units of lipase TID  - nausea: zofran 4 mg q8hrs PRN     #AKI, POA, resolved  Creatinine elevated above baseline ~1 up to 1.37 on admission, now resolved with fluid resuscitation. Likely secondary to poor PO intake and low volume status. FENa 0.6% on admission, consistent with pre-renal etiology.  - s/p fluid resuscitation  - trend BMP     #DOE, reported on admission  Reported by patient. No other respiratory s/sx. Low concern for PE in absence of chest pain or respiratory distress. Likely component of anemia, deconditioning, and cachexia.   - PT/OT consulted  - continuous pulse ox  - continuous cardiac monitoring     #Poor PO intake, chronic, POA  #acute weight loss, POA  #severe protein malnutrition, POA   #severe muscle loss with cachexia due to severe malnutrition, POA  Previously evaluated by nutrition who had recommended boost TID for assistance with malnutrition likely 2/2 poor PO intake from abdominal pain. Reports weight loss of approximately 50 lbs in the last year. Discharged at 104 lbs previously after 10 days down to 99 lbs on this admission (05/12/23). Thiamine wnl. Vitamin D level low at 17 (04/24/2023).   - nutrition consulted, appreciate recs  - boost TID  - continue vitamin D supplementation  - consider initiation of appetite stimulant  -  trend labs BID for risk of refeeding syndrome     #normocytic anemia, chronic, POA  Pt presents with hemoglobin 12.2 likely hemo-concentration. Baseline around 9-10. Anemia though to be secondary to nutritional deficiencies from poor PO intake. Folate/B12 wnl (04/2023). Iron studies in 2021 c/f element of IDA, improved on repeat 04/2023.  - trend CBC daily  - consider iron supplementation as outpatient     #emphysema, POA  #tobacco use, POA  Previous PPD smoker from age 55 and reduced over last 7 years to 1 cigarette daily.   - nicotine patch 7 mg   - fluticasone-salmeterol inhaler 1 puff BID, albuterol PRN  - counseled on nicotine cessation      #anxiety, chronic, POA   #major depression, chronic, POA  #fibromyalgia, chronic, POA  #insomnia, chronic, POA   All stable. Denies SI/HI.   - continue home seroquel 150 mg BID   - continue home duloxetine 60 mg daily   - continue home trazodone 50 mg qnightly PRN     #FEN/GI: regular diet     #Access: Peripheral IV 22 G Anterior;Left Forearm    Incision 04/29/23 Anterior;Proximal;Right (14)     #Prophylaxis: heparin SC     #Dispo: pending improvement in abdominal pain/nausea and pain management    #Code: Full Code    Renae Fickle, MD who agrees with the above assessment and plan unless otherwise noted in addendum.    Author:  Burnell Blanks, MD 05/13/2023 7:16 AM

## 2023-05-13 NOTE — Discharge Summary
Discharge Summary   Name: Donna Gross MRN: [id number] DOB: [birth year]   Admit Date: 05/12/2023 D/C Date:    LOS:   0 days    Admit Attending: Sammie Bench, MD Discharge Attending: Sammie Bench, MD    PCP: Kelle Darting, MD Discharge Provider: Lorrine Kin, MD     Inpatient Care Team/ Consults:    Inpatient Family Medicine Service  GI  Vascular Surgery  Pain Management   Outpatient Care Team  No care team member to display     Admission Diagnosis:  (reason for admission)  #acute on chronic abdominal pain, POA  #SMA stenosis, chronic, POA  #celiac stenosis, POA  #CT evidence of CAD, POA  #s/p angio and stenting of SMA of 04/29/23  #multiple abdominal surgeries c/b adhesions, chronic, POA  #GERD, chronic, POA  #diarrhea, chronic, intermittent, POA  #constipation, chronic, intermittent, POA  #nausea/vomiting, chronic, POA  #hx of pancreatic insufficiency, chronic, POA  #AKI, POA, resolved   #DOE, reported on admission   #Poor PO intake, chronic, POA  #acute weight loss, POA  #severe protein malnutrition, POA   #severe muscle loss with cachexia due to severe malnutrition, POA  #normocytic anemia, chronic, POA   #emphysema, POA  #tobacco use, POA  #anxiety, chronic, POA   #major depression, chronic, POA  #fibromyalgia, chronic, POA  #insomnia, chronic, POA  Discharge Diagnosis:  (conditions treated during hospitalization)  #acute on chronic abdominal pain, POA  #SMA stenosis, chronic, POA  #celiac stenosis, POA  #CT evidence of CAD, POA  #s/p angio and stenting of SMA of 04/29/23  #multiple abdominal surgeries c/b adhesions, chronic, POA  #GERD, chronic, POA  #diarrhea, chronic, intermittent, POA  #constipation, chronic, intermittent, POA  #nausea/vomiting, chronic, POA  #hx of pancreatic insufficiency, chronic, POA  #AKI, POA, resolved   #DOE, reported on admission   #Poor PO intake, chronic, POA  #acute weight loss, POA  #severe protein malnutrition, POA   #severe muscle loss with cachexia due to severe malnutrition, POA  #normocytic anemia, chronic, POA   #emphysema, POA  #tobacco use, POA  #anxiety, chronic, POA   #major depression, chronic, POA  #fibromyalgia, chronic, POA  #insomnia, chronic, POA    Disposition:  Home or Self care      Discharge Condition:  stable       HPI:   '' Donna Gross is a 57F with hx of anxiety, depression, emphysema, fibromyalgia, GERD, multiple abdominal surgeries including partial gastrectomy with gastrojejunostomy c/b adhesions, multiple hospitalizations for intractable abdominal pain due to SMA stenosis s/p recent SMA stenting (04/29/2023) here for intractable abdominal pain.     Endorses ongoing similar diffuse constant moderate-severe abdominal pain that radiates to her back which has been ongoing for a year, chronic diarrhea, nausea with intermittent vomiting, poor PO intake, and generalized weakness. Describes the pain as crampy and with a ''pushing'' sensation. Says she has been unable to keep food down. 24-hour recall notable for half a sandwich day prior to admission with tuna which she promptly vomited. Says she has done alright with fluids but not sure how much she has had to drink in the past day. Does not feel that SMA stenting earlier this month made any difference to her symptoms.  Says her last BM this AM was loose and watery, non-bloody. Shows a picture of a stool day prior to admission which was watery with some mucus.  Patient with recent admission (04/23/23 - 05/01/23) for intractable abdominal pain. Was transferred to [facility] for angiography and  stenting of SMA with vascular. Patient was discharged on dual antiplatelet therapy (aspirin + plavix).  Denies fevers/chills, CP, dysuria or urinary urgency, hematuria. Endorses intermittent shortness of breathe with exertion which is not normal for her. Used albuterol inhaler this AM which did not help.   Pmhx - anxiety, depression, emphysema, fibromyalgia, GERD, SMA stenosis  Meds - admission med rec  Surghx - cholecystectomy, colectomy for perforated abdomen with colostomy s/p take down, SMA stenting  Sochx:  - lives with daughter  - former one pack per day smoking from 89 yo to 7 years ago. Now at 1 cigarette daily  - denies alcohol  - denies recreational drug use''  Hospital Course:    Minda Faas is a 57F with hx of anxiety, depression, emphysema, fibromyalgia, GERD, multiple abdominal surgeries including partial gastrectomy with gastrojejunostomy c/b adhesions, multiple hospitalizations for intractable abdominal pain due to SMA stenosis s/p recent SMA stenting (04/29/2023) here for intractable abdominal pain admitted for pain management.   GI was consulted:  Recommended outpatient colonoscopy and continue symptoms management with   PO zofran 4mg , Hyoscyamine q4h PRN, Loperamide 2mg  q6h PRN, PO pantoprazole 40mg  daily, Lidocaine patch / heat to central abdominal scar, Creon 36K with meals. They also recommended  weaning opioids as able.  Pain was managed with tylenol, pregabalin, duloxetine, oxycodone, Pain management was consulted   I have seen and examined the patient and agree with the RD assessment detailed below:     Patient meets criteria for: Severe protein calorie malnutrition    (current weight 47.2 kg (104 lb), BMI (Calculated): 17.85;  ,  ).   See RD notes for additional details.     Procedures & Operations Performed:       Relevant Imaging Studies (most recent results only):   CT Abd and Pelvis   Narrative & Impression   CT ABD+PELVIS ANGIOGRAM WO+W CONTRAST     CLINICAL HISTORY: Abdominal pain.     COMPARISON: 04/23/2023.     TECHNIQUE: On a multirow-detector CT scanner, a volumetric pre- and post-contrast scan was performed through the abdomen and pelvis in both the arterial and venous phases. Post processed CTA MIP reformatted images were provided.     CONTRAST: iohexol (Omnipaque) 350 mg/mL inj 100 mL     RADIATION DOSE: The patient received the following exposure event(s) during this study, and the dose reference values for each are as shown (CTDIvol in mGy, DLP in mGy-cm). Note that the values are not patient dose but numbers generated from scan   acquisition factors based on 32 cm (L) and/or 16 cm (S) phantoms and may substantially under-estimate or over-estimate actual patient dose based on patient size and other factors. PreMonitoring, CTDI(L): 0.8, DLP: 0.8;Monitoring, CTDI(L): 6, DLP:   6;Abd/Pel wo, CTDI(L): 5.6, DLP: 238.1;Arterial, CTDI(L): 7.4, DLP: 317.7;Venous, CTDI(L): 7.5, DLP: 319     FINDINGS:     VASCULAR FINDINGS:  Active extravasation: None.  Hematoma: None.  Atherosclerotic calcifications: Moderate severe.  Aorta: No aneurysm or dissection.  Celiac artery: Patent with mild narrowing at the origin, unchanged.  SMA: Patent with proximal stent now in place.  IMA: Patent.  Renal arteries: Patent. Moderate calcified plaque at the origin of the left renal artery with mild associated narrowing, unchanged.  SMV: Patent.  IMV: Patent.  IVC: Patent.     ADDITIONAL FINDINGS:  Lower chest: Emphysematous changes within the lung bases.  Liver: Unremarkable.  Gallbladder and bile ducts: Surgically absent gallbladder with mildly dilated bile ducts, likely  related to postcholecystectomy state.  Spleen: Unremarkable.  Pancreas: Mildly atrophic.  Adrenals: Unremarkable.  Kidneys and ureters: Unremarkable.  Bowel: Partial gastrectomy with gastrojejunostomy. No small bowel dilatation to suggest obstruction. No small bowel wall thickening. Mild wall thickening of the ascending and transverse colon, mildly improved to that seen on prior examination. No   pneumatosis. No substantial stool burden. Prominent appendix measuring 9 mm in diameter, not substantially larger when compared to prior examination. Intraluminal content is present with few foci of gas. There is no periappendiceal inflammatory edema to   suggest acute appendicitis.  Bladder: Partially distended with concentric bladder wall thickening. No perivesical abnormality.  Reproductive organs: Unremarkable uterus and adnexa.  Lymph nodes: No lymphadenopathy.  Peritoneum: No free air, free fluid, or fluid collections. Paucity of visceral fat.  Abdominal wall: Unremarkable.  Bones: Lumbosacral degenerative disc disease with lower facet arthropathy and anterolisthesis, unchanged.        IMPRESSION:     No evidence of acute abdominopelvic pathology.     Atherosclerosis with patent mesenteric vasculature. Interval placement of superior mesenteric artery stent, which is patent.     Mild residual wall thickening of the ascending and transverse colon, mildly improved since prior examination.          Relevant labs:     CBC:  Lab Results   Component Value Date    WBC 5.37 05/16/2023    HGB 9.3 (L) 05/16/2023    HCT 30.5 (L) 05/16/2023    PLT 382 05/16/2023     LFT:  Lab Results   Component Value Date    ALBUMIN 4.9 05/12/2023    ALKPHOS 87 05/12/2023    AST 31 05/12/2023    ALT 11 05/12/2023       BMP:  Lab Results   Component Value Date    NA 136 05/16/2023    K 5.0 05/16/2023    CL 103 05/16/2023    CO2 25 05/16/2023    BUN 19 05/16/2023    CREAT 1.22 05/16/2023    CALCIUM 9.1 05/16/2023    GLUCOSE 78 05/16/2023       Cal/iCal/MG/Ph:  Lab Results   Component Value Date    CALCIUM 9.1 05/16/2023    MG 1.7 05/16/2023    PHOS 4.2 05/16/2023     Coags:  No results found for: ''PT'', ''APTT'', ''INR''          Physical Exam:   BP 96/61 (BP Location: Right arm)  ~ Pulse 68  ~ Temp 36.8 ?C (98.3 ?F) (Oral)  ~ Resp 17  ~ Ht 1.626 m (5' 4'')  ~ Wt 47.2 kg (104 lb)  ~ SpO2 95%  ~ BMI 17.85 kg/m?     General: Alert, comfortable, thin-appearing, NAD  Head: Atraumatic, normocephalic  Eyes: PERRL. EOM intact. Sclera are anincteric and noninjected.  Mouth/Throat: Mucous membranes moist  Cardiac: RRR, nl S1/S2, no m/r/g, no thrills on palpation.   Lungs: Respiratory effort nl, CTAB, no crackles or wheezes  Abdomen: soft, non-distended. Non tender to palpation without rebound or guarding. Multiple surgical scars.  Extr: No lower extremity edema, warm and well-perfused.  Skin: Warm and dry. No significant rashes or lesions noted.  Neuro: A&O x4/4.   Psych: Normal mood and affect        Outpatient Provider To-Do List  [ ]  f/u Post discharge 1-2 days   [ ]  Ensure follow up with Gastroenterology   [ ]  Ensure Vascular surgery follow up as outpatient  [ ]   Ensure Pain management follow up as outpatient           LACE+ Score: 53 (05/16/23 0801) Moderate Risk of Readmission  Better Outcomes by Optimizing Safe Transitions        This patient meets criteria for the following BOOST risk conditions which could lead to readmission. Please consider further evaluation or action on the potential barriers below:    Problems with Medications:      19 scheduled medications active at discharge  High-risk active medications: aspirin 81 mg chewable tablet [[id number]], clopidogrel 75 mg tablet [[id number]], pregabalin 25 mg capsule [[id number]]      Prior Hospitalizations:    3 prior inpatient admission(s) in past 6 months      Patient Support:     Home Health Ordered: No.                       Pending Labs   The following tests have been collected, but not yet resulted at the time of discharge.    Unresulted Labs (From admission, onward)      None          The following labs have a preliminary result at the time of discharge.  Please check back if the final results have changed.  Preliminary Resulted Labs (From admission, onward)      None          Discharge Medication List  Coumadin and Opiate Risk Assessment   This patient does not have coumadin on their discharge med list  Controlled meds:   Controlled Medications       Opioid Agonists Disp Start End     oxyCODONE 10 mg tablet 40 tablet 05/16/2023 --    Sig - Route: Take 1 tablet (10 mg total) by mouth every six (6) hours as needed. Max Daily Amount: 40 mg - Oral    Earliest Fill Date: 05/16/2023       Opioid Combinations Disp Start End     oxyCODONE-acetaminophen 7.5-325 MG per tablet (Discontinued) 30 tablet 05/01/2023 05/16/2023    Sig - Route: Take 1 tablet by mouth every six (6) hours as needed for Pain. Max Daily Amount: 4 tablets - Oral    Earliest Fill Date: 05/01/2023           Current Discharge Medication List        START taking these medications    Details   acetaminophen 500 mg tablet Take 2 tablets (1,000 mg total) by mouth every six (6) hours as needed for Pain.  Qty: 60 tablet, Refills: 0      lidocaine 5% patch Place 3 patches onto the skin daily 12 hours on 12 hours off.  Qty: 30 patch, Refills: 0      loperamide 2 mg capsule Take 1 capsule (2 mg total) by mouth four (4) times daily as needed for Diarrhea.  Qty: 30 capsule, Refills: 0      Menthol-Methyl Salicylate (ANALGESIC BALM) 10-15 % cream Apply topically four (4) times daily.  Qty: 60 g, Refills: 0      oxyCODONE 10 mg tablet Take 1 tablet (10 mg total) by mouth every six (6) hours as needed. Max Daily Amount: 40 mg  Qty: 40 tablet, Refills: 0      pregabalin 25 mg capsule Take 1 capsule (25 mg total) by mouth two (2) times daily. Max Daily Amount: 50 mg  Qty: 60 capsule, Refills: 0  CONTINUE these medications which have CHANGED    Details   hyoscyamine 0.125 mg tablet Take 1 tablet (125 mcg total) by mouth every four (4) hours as needed for Cramping.  Qty: 30 tablet, Refills: 0      naloxone 4 mg/0.1 mL nasal spray Call 911. Administer a single spray intranasally into one nostril for opioid overdose. May repeat in 3 minutes if patient is not breathing.Rob Bunting: 2 each, Refills: 1      ondansetron 4 mg tablet Take 1 tablet (4 mg total) by mouth every six (6) hours as needed for Nausea or Vomiting.  Qty: 30 tablet, Refills: 0      pancrelipase, Lip-Prot-Amyl, (CREON) 36000 units DR capsule Take 1 capsule (36,000 units of lipase total) by mouth three (3) times daily with meals.  Qty: 90 capsule, Refills: 0      pantoprazole 40 mg DR tablet Take 1 tablet (40 mg total) by mouth daily.  Qty: 30 tablet, Refills: 0 Polyethylene Glycol 3350 (PEG 3350) 17 GM/SCOOP POWD Mix 1 capful (17 g) in 6 to 8 oz of liquid and drink by mouth two (2) times daily.  Qty: 510 g, Refills: 0      senna 8.6 mg tablet Take 1 tablet by mouth at bedtime as needed for Constipation.  Qty: 60 tablet, Refills: 0           CONTINUE these medications which have NOT CHANGED    Details   albuterol 90 mcg/act inhaler Inhale 2 puffs every six (6) hours as needed for Wheezing or Shortness of Breath.      aspirin 81 mg chewable tablet Chew 1 tablet (81 mg total) by mouth daily.  Qty: 90 tablet, Refills: 0      atorvastatin 40 mg tablet Take 1 tablet (40 mg total) by mouth at bedtime.      B Complex Vitamins (VITAMIN B COMPLEX) capsule Take 1 capsule by mouth daily.      calcium carbonate 500 mg chewable tablet Chew 1 tablet (500 mg total) by mouth two (2) times daily as needed Calcium Carbonate 500 mg is equivalent to 200 mg elemental Calcium.  Qty: 30 tablet, Refills: 0      Cholecalciferol (VITAMIN D3) 50 mcg (2000 units) TABS Take 1 tablet (50 mcg total) by mouth daily.      cholestyramine 4 g/dose powder Take 1 packet (4 g total) by mouth three (3) times daily with meals.  Qty: 378 g, Refills: 3      clobetasol 0.05% cream Apply topically three (3) times daily as needed (rash).      clopidogrel 75 mg tablet Take 1 tablet (75 mg total) by mouth daily.  Qty: 90 tablet, Refills: 0      cyanocobalamin 1000 mcg tablet Take 1 tablet (1,000 mcg total) by mouth daily.      DULoxetine 60 mg DR capsule Take 1 capsule (60 mg total) by mouth daily.  Qty: 30 capsule, Refills: 0      fluticasone-salmeterol 250-50 mcg/act diskus inhaler Inhale 1 puff two (2) times daily.      ipratropium-albuterol 20-100 mcg/act inhaler Inhale 2 puffs four (4) times daily as needed for Wheezing or Shortness of Breath.      QUEtiapine 100 mg tablet Take 1 tablet (100mg  with the other strength for a  total of 150 mg) by mouth two (2) times daily.  Qty: 30 tablet, Refills: 0      ranolazine 500 mg 12 hr tablet Take  1 tablet (500 mg total) by mouth two (2) times daily.      sodium sulfate-potassium sulfate-magnesium sulfate solution At 6pm the evening before colonoscopy, drink 16oz x1 dose, then drink 32oz water over 1 hour. At 5am the day of colonoscopy, repeat above.  Qty: 354 mL, Refills: 0           STOP taking these medications       albuterol (2.5 mg/58mL) 0.083% nebulizer solution        gabapentin 300 mg capsule        oxyCODONE-acetaminophen 7.5-325 MG per tablet        prochlorperazine 10 mg tablet             Requested Appointments   Patient to schedule follow up appointment   As directed      We will schedule and contact you with follow up appointment(s)   As   directed      Is the patient being discharged to SNF? No  Clinic/Test/Procedure to be scheduled:vascular   Doctor Preferences (If applicable): Jesus Houston Siren,  Time Frame: 1 month  Reason for Appointment/Procedure: Hospital follow up      Scheduled Appointments  Future Appointments   Date Time Provider Department Center   05/28/2023 10:00 AM [id number] [place] [facility] [place]   05/31/2023 12:15 PM Rigberg, Cliffton Asters., MD [facility] [place]/[facility]          Nutrition Recommendations & Malnutrition Assessment   I have seen and examined the patient and agree with the RD assessment detailed below:     Patient meets criteria for: Severe protein calorie malnutrition    (current weight 47.2 kg (104 lb), BMI (Calculated): 17.85;  ,  ).   See RD notes for additional details.     Continue current diet order: Clear Liquid/No red items   Boost Breeze TID and Prosource TID   Advance to Regular as soon as medically feasible     2.  Continue vitamin and mineral supplementation   50 mcg cholecalciferol once daily PO   Vitamin B complex capsule once daily PO (1 capsule contains 3 mg thiamine)   1,000 mg cyanocobalamin once daily PO     3. Consider nutrition support if pt is unable to have consistently good PO intake  PPN: D7%,AA4% @ 85 ml/hr  + 250 ILE daily    = 1312 kcals, 82 g protein, and ~1 g/kg lipids/day     4. Monitor lytes twice daily for refeeding risk     5. Monitor I/O, weekly weights, and labs as able     Author:  Jeni Salles, RD pager [phone]   05/13/2023 9:56 AM    Level of Malnutrition: Severe protein calorie malnutrition  Discharge Diet Orders:  Regular: all foods are allowed   As directed        Skin/Wound         Discharge Activity Orders   There are no active discharge activity orders for this encounter.   Rehab Assessment   No PT evaluation this encounter        No OT evaluation this encounter        Case Manager/Home Health Assessment   Discharge Information  Discharge Address: [address]  [place] [address] (05/14/23 1550)              Home Health Orders  There are no active discharge home health orders for this  encounter.   Goals of Care Note (if new for this encounter)     I have seen and examined the patient on their discharge day. I have reviewed, edited, and agree with the above discharge summary. More than 30 minutes was personally spent on discharge planning on the day of discharge in coordination of care, counseling the patient and preparation of discharge.    Discussed with attending physician, Dr. Leodis Rains, who agrees with the assessment and plan above, unless noted in the addendum.     Lorrine Kin, MD  05/16/2023 10:08 AM       30 minutes were spent personally by me today on this encounter which include today's pre-visit review of the chart, obtaining appropriate history, performing an evaluation, documentation and discussion of management with details supported within the note for today's visit. The time documented was exclusive of any time spent on the separately billed procedure.             Sammie Bench

## 2023-05-13 NOTE — Other
Patient's Clinical Goal:   Clinical Goal(s) for the Shift: stable vitals, stable labs, fall/safety precautions, pain management  Identify possible barriers to advancing the care plan:   Stability of the patient: Moderately Stable - low risk of patient condition declining or worsening   Progression of Patient's Clinical Goal:     AOX4  RA-SpO2 monitoring  Nontele  BMAT 4  X1 BM  Clear liquid diet  AM EKG completed  Dilaudid and oxycodone administered per order for pain

## 2023-05-13 NOTE — Progress Notes
Pharmaceutical Services - Admission Medication Reconciliation Note      Patient Name: Donna Gross  Medical Record Number: [id number]  Admit date:      Age: 57 y.o.  Sex: female  Allergies:   Allergies   Allergen Reactions    Hydrocodone Other (See Comments)     ''burns a hole in stomach''  Tolerates hydromorphone    Ibuprofen Other (See Comments)     GI discomfort    ''hurts my stomach''    Morphine Itching     Tolerates hydromorphone     Height:   Most recent documented height   05/12/23 1.626 m (5' 4'')     Actual Weight:   Most recent documented weight   05/13/23 48.5 kg   04/11/23 50.9 kg     Diagnosis: The patient is currently admitted with the following concerns/issues: Principal Problem:    Abdominal pain (POA: Yes)  Active Problems:    Intractable abdominal pain (POA: Yes)      Reported Medication History   I spoke with the patient over the phone and used [employer] (outpatient Rx fill history database) to update the home medication list for this hospital admission.    PTA Medication List (discrepancies are noted)   Medications Prior to Admission   Medication Sig Note Last Dose    albuterol (2.5 mg/78mL) 0.083% nebulizer solution Take 3 mLs (2.5 mg total) by nebulization three (3) times daily as needed for Shortness of Breath.      albuterol 90 mcg/act inhaler Inhale 2 puffs every six (6) hours as needed for Wheezing or Shortness of Breath.      aspirin 81 mg chewable tablet Chew 1 tablet (81 mg total) by mouth daily.      atorvastatin 40 mg tablet Take 1 tablet (40 mg total) by mouth at bedtime.      B Complex Vitamins (VITAMIN B COMPLEX) capsule Take 1 capsule by mouth daily.      calcium carbonate 500 mg chewable tablet Chew 1 tablet (500 mg total) by mouth two (2) times daily as needed Calcium Carbonate 500 mg is equivalent to 200 mg elemental Calcium. (Patient taking differently: Chew 1 tablet (500 mg total) by mouth two (2) times daily Calcium Carbonate 500 mg is equivalent to 200 mg elemental Calcium.) Cholecalciferol (VITAMIN D3) 50 mcg (2000 units) TABS Take 1 tablet (50 mcg total) by mouth daily.      cholestyramine 4 g/dose powder Take 1 packet (4 g total) by mouth three (3) times daily with meals.      clobetasol 0.05% cream Apply topically three (3) times daily as needed (rash).      clopidogrel 75 mg tablet Take 1 tablet (75 mg total) by mouth daily.      cyanocobalamin 1000 mcg tablet Take 1 tablet (1,000 mcg total) by mouth daily.      DULoxetine 60 mg DR capsule Take 1 capsule (60 mg total) by mouth daily.      fluticasone-salmeterol 250-50 mcg/act diskus inhaler Inhale 1 puff two (2) times daily.      gabapentin 300 mg capsule Take 1 capsule (300 mg total) by mouth three (3) times daily.      hyoscyamine 0.125 mg tablet Take 1 tablet (125 mcg total) by mouth every four (4) hours as needed for Cramping.      ipratropium-albuterol 20-100 mcg/act inhaler Inhale 2 puffs four (4) times daily as needed for Wheezing or Shortness of Breath.      ondansetron 4  mg tablet Take 1 tablet (4 mg total) by mouth every six (6) hours as needed for Nausea or Vomiting.      oxyCODONE-acetaminophen 7.5-325 MG per tablet Take 1 tablet by mouth every six (6) hours as needed for Pain. Max Daily Amount: 4 tablets      pancrelipase, Lip-Prot-Amyl, (CREON) 36000 units DR capsule Take 1 capsule (36,000 units of lipase total) by mouth three (3) times daily with meals.      pantoprazole 40 mg DR tablet Take 1 tablet (40 mg total) by mouth daily.      Polyethylene Glycol 3350 (PEG 3350) 17 GM/SCOOP POWD Mix 1 capful (17 g) in 6 to 8 oz of liquid and drink by mouth two (2) times daily. (Patient taking differently: Take 17 g by mouth two (2) times daily as needed.)      prochlorperazine 10 mg tablet Take 1 tablet (10 mg total) by mouth every six (6) hours as needed for Nausea or Vomiting.      QUEtiapine 100 mg tablet Take 1 tablet (100mg  with the other strength for a  total of 150 mg) by mouth two (2) times daily. (Patient taking differently: Take 1 tablet (100 mg total) by mouth two (2) times daily.)      ranolazine 500 mg 12 hr tablet Take 1 tablet (500 mg total) by mouth two (2) times daily.      senna 8.6 mg tablet Take 1 tablet by mouth at bedtime as needed for Constipation.      naloxone 4 mg/0.1 mL nasal spray Call 911. Administer a single spray intranasally into one nostril for opioid overdose. May repeat in 3 minutes if patient is not breathing.. 05/13/2023: Patient has at home          Discharge Prescription Preference:   [id number] [place], [place] - [address] [place]  [address] [place] [address]        The patient's allergies and medications have been reviewed and updated.     Mercer Pod Bevier, 05/13/2023, 2:17 PM    -------------------------------------------------------------------------------------------------------------------  I have reviewed the medication list compiled by the medication reconciliation pharmacy technician and agree with the findings.      Reconciliation  The assessment and reconciliation of admission and inpatient orders with PTA medication list is complete. There are no issues requiring follow up at this time.     This note represents our good faith effort to obtain the best possible medication history from all attainable sources at the time of reconciliation    Lorayne Bender, PharmD, AAHIVP  Transitions of Care Pharmacist  [phone]

## 2023-05-13 NOTE — Other
Patient's Clinical Goal: comfort  Clinical Goal(s) for the Shift: stable vitals, stable labs, fall/safety precautions, pain management  Identify possible barriers to advancing the care plan: none  Stability of the patient: Moderately Stable - low risk of patient condition declining or worsening     Plan of Care  Monitor vitals  Monitor labs  Fall/safety precautions  Pain management  Clear liquid diet    Notable/Significant Events and Interventions  Patient transferred to unit at 1430    Additional Notes  Patient is oriented x4  BMAT 4. Pt ambulates indepedently  Patient complains of pain. Oxycodone 5mg , dilaudid, and tylenol given.    Handoff Checklist  Central Line: No  Foley Catheter: No  High-risk drugs independently verified: N/A  Sepsis screen reviewed and completed with 2nd RN: Yes  Pressure injury: No  RN/MD rounding: Yes    Donna Gross

## 2023-05-13 NOTE — Consults
[facility] DEPARTMENT OF MEDICINE  DIVISION OF GASTROENTEROLOGY  GENERAL GI CONSULT NOTE    PATIENT: Donna Gross  MRN: [id number]  DOB: Jun 11, 1966    ADMISSION DATE:     HOSPITAL DAY: 0  DATE OF SERVICE: 05/13/2023  PRIMARY TEAM ATTENDING: Sammie Bench, MD  REASON FOR CONSULT: abd pain, N/V, diarrhea    HPI/SUBJECTIVE  Donna Gross is a 57 y.o. female with GERD, pancreatitis, anxiety/depression, fibromyalgia, perforated gastric ulcer s/p Billroth 2 surgery (2016), partial colectomy for colonic perforation (2015) with colostomy s/p reversal, gallstone pancreatitis s/p cholecystectomy (03/27/2022) with c/f post-operative bile leak s/p ERCP (04/22/22) with empiric plastic biliary stent placement and removal (06/2022), SMA stenosis s/p recent SMA stenting (04/29/23) who presented with abdominal pain, nausea, poor PO intake and diarrhea for the past year.      She reports since her cholecystectomy 03/2022, she has had constant diffuse abdominal pain (primarily epigastric) that is worse with eating and improves with small movements, no change with bowel movements. Has post-prandial nausea/vomiting immediately after eating almost anything, denies dysphagia, does have heartburn due to recurrent emesis and sometimes with pink streaks to emesis. Takes TUMS sometimes which helps slightly. Also will have watery bowel movements 3-15 minutes after eating anything. Will usually start with a large volume watery stool, may need to go back again 5 minutes later with smaller bowel movements. Sometimes has urgency and doesn't have a bowel movement at all. Patient reports having 2 loose, water bowel movements yesterday, but sometimes as many as 13-14, sometimes nocturnal diarrhea. With rectal pain associated with BM and sometimes streaks of blood in stool; one time had red blood on toilet paper. Reported last colonoscopy ~3 years ago at [facility] with Dr. Lazarus Salines, doesn't remember the results. With associated 50-lb weight loss since her surgery (154 -> 106 lbs).      Takes zofran once in the morning. Also takes Percocet up to 4 times daily for pain. Denies NSAID use, not interested in acupuncture. Reports taking Creon 36K before meals and cholestyramine 4g TID. Denies having any abdominal pain or opioid use prior to cholecystectomy 03/2022.      ED presentation 05/2022, 07/2022, 11/2022, 01/2023 for ongoing symptoms.     Afebrile, HDS, WBC 9 -> 5, Hgb 12.2 -> 9.1, Plt 436, BMP wnl, hstrop 9 -> 10. CTAP w/ atherosclerosis w/ patent mesenteric vasculature. Interval placement of superior mesenteric artery stent, which is patent. Mild residual wall thickening ascending and transverse colon, mildly improved since prior exam, otherwise no acute findings. On pantoprazole qday, hyoscyamine QID, lidocaine patch, ATC tylenol, pregabalin BID, duloxetine 60 mg qday, ranolazine, dilaudid PRN, oxycodone PRN. Dilaudid IV x4, Oxy 7.5mg  x3, Zofran x1    PMH  Past Medical History:   Diagnosis Date    Anxiety     Depression     Emphysema, unspecified (HCC/RAF)     Emphysema, unspecified (HCC/RAF)     Fibromyalgia     Gastritis     GERD (gastroesophageal reflux disease)     History of blood transfusion     Intractable nausea and vomiting 01/23/2020    Pancreatitis      PSH  Past Surgical History:   Procedure Laterality Date    ABDOMINAL SURGERY      COLON SURGERY       MEDS  Home medications  Current Outpatient Medications   Medication Instructions    albuterol (PROVENTIL, VENTOLIN) 2.5 mg, Nebulization, 3 times daily PRN    albuterol 90 mcg/act inhaler 2  puffs, Inhalation, Every 6 hours PRN    ASPIRIN LOW DOSE 81 mg, Oral, Daily    atorvastatin (LIPITOR) 40 mg, Oral, Every night at bedtime    B Complex Vitamins (VITAMIN B COMPLEX) capsule 1 capsule, Oral, Daily    CALCIUM ANTACID 500 mg, Oral, 2 times daily PRN, Calcium Carbonate 500 mg is equivalent to 200 mg elemental Calcium    cholestyramine (QUESTRAN) 4 g, Oral, 3 times daily with meals    clobetasol 0.05% cream Topical, 3 times daily PRN    clopidogrel (PLAVIX) 75 mg, Oral, Daily    cyanocobalamin (CYANOCOBALAMIN) 1,000 mcg, Oral, Daily    DULoxetine (CYMBALTA) 60 mg, Oral, Daily    fluticasone-salmeterol 250-50 mcg/act diskus inhaler 1 puff, Inhalation, 2 times daily    gabapentin (NEURONTIN) 300 mg, Oral, 3 times daily    hyoscyamine (LEVSIN) 125 mcg, Oral, Every 4 hours PRN    ipratropium-albuterol 20-100 mcg/act inhaler 2 puffs, Inhalation, 4 times daily PRN    naloxone 4 mg/0.1 mL nasal spray Call 911. Administer a single spray intranasally into one nostril for opioid overdose. May repeat in 3 minutes if patient is not breathing.    ondansetron (ZOFRAN) 4 mg, Oral, Every 6 hours PRN    oxyCODONE-acetaminophen 7.5-325 MG per tablet 1 tablet, Oral, Every 6 hours PRN    pancrelipase, Lip-Prot-Amyl, (CREON) 36000 units DR capsule 36,000 units of lipase, Oral, 3 times daily with meals    pantoprazole (PROTONIX) 40 mg, Oral, Daily    Polyethylene Glycol 3350 (PEG 3350) 17 GM/SCOOP POWD Mix 1 capful (17 g) in 6 to 8 oz of liquid and drink by mouth two (2) times daily.    prochlorperazine (COMPAZINE) 10 mg, Oral, Every 6 hours PRN    QUEtiapine 100 mg tablet Take 1 tablet (100mg  with the other strength for a  total of 150 mg) by mouth two (2) times daily.    ranolazine (RANEXA) 500 mg, Oral, 2 times daily    senna 8.6 mg tablet 1 tablet, Oral, Every night at bedtime PRN    Vitamin D3 50 mcg, Oral, Daily     Scheduled:  acetaminophen, 1,000 mg, Oral, TID  analgesic balm, , Topical, QID  aspirin, 81 mg, Oral, Daily  atorvastatin, 40 mg, Oral, QHS  cholestyramine, 4 g, Oral, TID w/meals & QHS  clopidogrel, 75 mg, Oral, Daily  cyanocobalamin, 1,000 mcg, Oral, Daily  DULoxetine, 60 mg, Oral, Daily  fluticasone-salmeterol, 1 puff, Inhalation, BID  heparin, 5,000 Units, Subcutaneous, BID  hyoscyamine, 125 mcg, Oral, QID  lidocaine, 3 patch, Transdermal, Q24H  magnesium sulfate in water for injection, 4 g, Intravenous, Once **FOLLOWED BY** magnesium sulfate in water for injection, 2 g, Intravenous, Once  nicotine patch, 7 mg, Transdermal, Daily  pancrelipase (Lip-Prot-Amyl), 36,000 units of lipase, Oral, TID w/meals  pantoprazole, 40 mg, Oral, Daily  polyethylene glycol, 17 g, Oral, BID  pregabalin, 25 mg, Oral, BID  QUEtiapine, 150 mg, Oral, BID  ranolazine, 500 mg, Oral, Q12H  vitamin B complex, 1 capsule, Oral, Daily  cholecalciferol, 50 mcg, Oral, Daily    PRN:  albuterol, calcium carbonate, HYDROmorphone, melatonin oral/enteral/sublingual, naloxone, ondansetron **OR** ondansetron injection/IVPB, oxyCODONE **OR** oxyCODONE, senna, traZODone    Infusions:    ALLERGIES  Hydrocodone, Ibuprofen, and Morphine    FMH  Family History   Problem Relation Age of Onset    Heart disease Mother     Diabetes Brother     Diabetes Father     Anesthesia problems Neg Hx  Malignant hypertension Neg Hx     Hypotension Neg Hx     Malignant hyperthermia Neg Hx     Pseudochol deficiency Neg Hx      SOCIAL HISTORY  Social History     Tobacco Use    Smoking status: Every Day     Types: Cigarettes    Smokeless tobacco: Current    Tobacco comments:     1 cig per day   Vaping Use    Vaping status: Never Used   Substance Use Topics    Alcohol use: No    Drug use: No     PHYSICAL EXAM    Temp:  [36.4 ?C (97.6 ?F)-36.9 ?C (98.4 ?F)] 36.5 ?C (97.7 ?F)  Heart Rate:  [60-81] 69  Resp:  [16-18] 17  BP: (91-138)/(66-97) 116/75  NBP Mean:  [75-107] 88  SpO2:  [93 %-100 %] 96 %  BMI Readings from Last 1 Encounters:   05/13/23 18.35 kg/m?     System Check if examined and normal Positive or additional negative findings   Constit  [x]  General appearance - normal     Eyes  []  Conjuctivae clear       HENMT  []  Normal Lips/teeth/gums, oropharynx, head     Neck  []  Inspection; no masses palpated     Resp  [x]  Effort good. No labored breathing     CV  []  Normal rhythm/rate   []  Normal pulses     Abdomen  [x]  Soft, non-tender, non-distended, no rebound guarding    []  Active bowel sounds 4 quadrants   []  No appreciable flank or shifting dullness   []  No hepatosplenomegaly   []  No umbilical hernia    Rectal   []  No external hemorrhoids or fissure   []  No masses palpated on DRE   []  Appropriate resting pressure/sphincter tone   []  Appropriate squeeze pressure   []  Appropriate bear down maneuver   []  Chaperone present for exam     MSK  []  Grossly normal strength, ROM     Neuro   []  Grossly normal      Psychiatric  [x]   Oriented to time, place and person   []   Appropriate insight/judgement & mood/affect           LABS/STUDIES    Labs Independently Interpreted by me today  Lab Results   Component Value Date    WBC 5.79 05/13/2023    HGB 9.1 (L) 05/13/2023    HCT 28.4 (L) 05/13/2023    MCV 84.0 05/13/2023    PLT 436 (H) 05/13/2023     Imaging Tests Independently Interpreted by me today     CTAP 05/12/23  IMPRESSION:  No evidence of acute abdominopelvic pathology.  Atherosclerosis with patent mesenteric vasculature. Interval placement of superior mesenteric artery stent, which is patent.  Mild residual wall thickening of the ascending and transverse colon, mildly improved since prior examination.    Procedures    EGD 01/28/23  FINDINGS:   The esophagus and GE junction were normal.  The gastric pouch was mildly erythematous with bilious fluid noted.  Billroth 2 anatomy was noted.  The GJ anastamosis appeared normal. The efferent and afferent limbs were intubated a short distance and both   appeared normal.  Biopsy taken with cold biopsy forceps from the stomach.  DIAGNOSIS:  Mild gastric erythema, otherwise normal endoscopy with normal Billroth 2 anatomy.    A&P  Donna Gross is a 57 y.o. female with GERD, pancreatitis, anxiety/depression,  fibromyalgia, perforated gastric ulcer s/p Billroth 2 surgery (2016), partial colectomy for colonic perforation (2015) with colostomy s/p reversal, gallstone pancreatitis s/p cholecystectomy (03/27/2022) with c/f post-operative bile leak s/p ERCP (04/22/22) with empiric plastic biliary stent placement and removal (06/2022), SMA stenosis s/p recent SMA stenting (04/29/23) who presented with abdominal pain, nausea, poor PO intake and diarrhea for the past year.     Since her cholecystectomy 03/2022, she complains of constant abdominal pain, worsened with PO intake, c/b post-prandial nausea/vomiting (3 times daily) and diarrhea (5-15 times daily, also with nocturnal symptoms) and rectal pain with intermittent rectal bleeding c/f fissure. Definitely has some component of abdominal wall pain with pain overlying central surgical scar and +Carnett's. Also high concern for functional dyspepsia and gut hypersensitivity. Query some dysmotility due to opioid use, though recognize that her opioid requirement only began after post-operative symptom onset. Lower c/f symptoms due to chronic pancreatitis due to relatively normal appearance of pancreas on imaging. Ruled out esophagitis on EGD, bx showing reactive antral gastropathy w/ no H pylori, no e/o obstruction on repeated CTs due to multiple abdominal surgeries c/b adhesions (never had UGIS). C diff PCR negative, bacterial/parasitic pathogen panel negative, pancreatic elastase 39, calprotectin 192, celiac serologies normal. C diff PCR negative, bacterial/parasitic pathogen panel negative, pancreatic elastase 39, calprotectin 192, celiac serologies normal. Query microscopic colitis iso SNRI use versus bile acid diarrhea, though doesn't notice improvement of diarrhea with cholestyramine use. Planned for colonoscopy for further evaluation, however patient reports she is unable to tolerate inpatient prep (GoLytely 4 L ) d/t volume required to drink. After discussion and shared decision-making, the patient will continue to consider attempting inpatient bowel prep and colonoscopy but would like to defer for now.     - Patient would like to continue to consider outpatient vs inpatient colonoscopy, defer bowel prep at this time  - Nutrition consult  - [facility] consultation for pain  - PO zofran 4mg  1st line, PO compazine 2nd line for nausea  - Hyoscyamine q4h PRN  - Loperamide 2mg  q6h PRN  - PO pantoprazole 40mg  daily  - Lidocaine patch / heat to central abdominal scar  - Continue Creon 36K with meals                - Will consider increasing dose  - Consider weaning opioids as able, though patient was previously very upset and not amenable to this plan  - Outpatient will consider TCA initiation  - Could consider SIBO testing outpatient     Code Status:  Full Code    Discussed with attending Dr. Darrall Dears. We will continue to follow.     I reviewed these specialist notes that directly related to the patient's acute and/or chronic medical problems with documentation of the salient findings, if any: Family Medicine    I have spoken with the primary team regarding the patient's care today. Our conclusions were as above.      Author:    Val Riles 05/13/2023   GI Fellow  If after 5pm or on weekends, please page GI fellow on call        The patient was seen and examined with the fellow, Dr. Selena Batten.  The case was discussed with the fellow, and I agree with the findings and plan of care we formulated together as documented in the fellow?s note.    Johny Chess, MD 05/16/2023 8:42 PM  Assistant Professor   [place] [facility]

## 2023-05-13 NOTE — Consults
NUTRITION ASSESSMENT (Adult)    Admit Date:      Date of Birth: October 05, 1966 Gender: female MRN: [id number]     Date of Assessment: 05/13/2023    Consult   Indication: BMI < or equal to 18.5   Subjective: Visited patient in ED. Patient is admitting with complains of abdominal pain. Patient repots that she constantly experiences pain. Reduced intake at home and unable to eat full meals. Patient also experiences diarrhea after eating. Per patient, she has around four to five loose stool episodes per day. Weight loss reported.    Problems: Principal Problem:    Abdominal pain (POA: Yes)  Active Problems:    Intractable abdominal pain (POA: Yes)       Past Medical History:   Diagnosis Date    Anxiety     Depression     Emphysema, unspecified (HCC/RAF)     Emphysema, unspecified (HCC/RAF)     Fibromyalgia     Gastritis     GERD (gastroesophageal reflux disease)     History of blood transfusion     Intractable nausea and vomiting 01/23/2020    Pancreatitis     Past Surgical History:   Procedure Laterality Date    ABDOMINAL SURGERY      COLON SURGERY            Per H&P/MD notes: Donna Gross is a 52F with hx of anxiety, depression, emphysema, fibromyalgia, GERD, multiple abdominal surgeries including partial gastrectomy with gastrojejunostomy c/b adhesions, multiple hospitalizations for intractable abdominal pain due to SMA stenosis s/p recent SMA stenting (04/29/2023) here for intractable abdominal pain.     Endorses ongoing similar diffuse constant moderate-severe abdominal pain that radiates to her back which has been ongoing for a year, chronic diarrhea, nausea with intermittent vomiting, poor PO intake, and generalized weakness. Describes the pain as crampy and with a ''pushing'' sensation. Says she has been unable to keep food down. 24-hour recall notable for half a sandwich day prior to admission with tuna which she promptly vomited. Says she has done alright with fluids but not sure how much she has had to drink in the past day. Does not feel that SMA stenting earlier this month made any difference to her symptoms.     Data   Intake/Outputs: I/O last 2 completed shifts:  In: 796.9 [P.O.:600; I.V.:196.9]  Out: -      Pertinent Medications: acetaminophen, 1,000 mg, Oral, TID  analgesic balm, , Topical, QID  aspirin, 81 mg, Oral, Daily  atorvastatin, 40 mg, Oral, QHS  cholestyramine, 4 g, Oral, TID w/meals & QHS  clopidogrel, 75 mg, Oral, Daily  cyanocobalamin, 1,000 mcg, Oral, Daily  DULoxetine, 60 mg, Oral, Daily  fluticasone-salmeterol, 1 puff, Inhalation, BID  heparin, 5,000 Units, Subcutaneous, BID  hyoscyamine, 125 mcg, Oral, QID  lidocaine, 3 patch, Transdermal, Q24H  [COMPLETED] magnesium sulfate in water for injection, 4 g, Intravenous, Once **FOLLOWED BY** magnesium sulfate in water for injection, 2 g, Intravenous, Once  nicotine patch, 7 mg, Transdermal, Daily  pancrelipase (Lip-Prot-Amyl), 36,000 units of lipase, Oral, TID w/meals  pantoprazole, 40 mg, Oral, Daily  polyethylene glycol, 17 g, Oral, BID  pregabalin, 25 mg, Oral, BID  QUEtiapine, 150 mg, Oral, BID  ranolazine, 500 mg, Oral, Q12H  vitamin B complex, 1 capsule, Oral, Daily  cholecalciferol, 50 mcg, Oral, Daily    FDI Target Drugs: No      Pertinent Labs:   Recent Labs     05/13/23  0454 05/12/23  1331  05/12/23  0658   NA 135   < > 135   K 4.3   < > 4.6   BUN 14   < > 19   CREAT 0.99   < > 1.37*   GLUCOSE 85   < > 83   HGBA1C  --   --  5.4   MG 1.4  --  1.5   CALCIUM 9.1   < > 10.4   PHOS 3.6  --  4.1   ALBUMIN  --   --  4.9   WBC 5.79  --  9.41   HGB 9.1*  --  12.2   HCT 28.4*  --  38.3   AST  --   --  31   ALT  --   --  11   ALKPHOS  --   --  87   LIPASE  --   --  27   AMYLASE  --   --  90    < > = values in this interval not displayed.       Trended Labs     05/12/23  0658 01/28/23  0646   HGBA1C 5.4  --    TRIGLY  --  93        Trended Labs     04/24/23  1555 04/24/23  0302 04/24/23  0301   VITD25OH  --   --  17*   VITAMINB1  --   --  117   VITAMINB12  --  271 --    FOLATE  --   --  21.8   FE  --  42  --    TIBC  --  381  --    FEBINDSAT  --  11  --    FERRITIN  --  27  --    ZINCSER 94.0  --   --        Accu-Chek: No results found in last 72 hours    Respiratory Status / O2 Device: None (Room air)    Pressure Injury: none recorded    Additional data:    Peripheral IV 22 G Anterior;Left Forearm   Edema  Flowsheet Row Most Recent Value   Generalized     Sacral     RUE     LUE     RLE     LLE         Diet Info   Allergies:   Hydrocodone, Ibuprofen, and Morphine  Cultural/Ethnic/Religious/Other Food Preferences:  None     Nutrition prior to admit:  Reduced intake. Pt eating sandwiches at home. Pancreatic enzymes three times per day before each meal  Current diet order:     Diets/Supplements/Feeds   Diet    Diet clear liquid No Red Items     Start Date/Time: 05/12/23 1610      Number of Occurrences: Until Specified     Order Questions:      Additional restrictions: No Red Items   Nourishments    Oral nutrition supplements Breakfast, Dinner; [employer] [place], Boost Breeze [place], Boost Pudding [place], Boost Pudding [place]     Start Date/Time: 05/12/23 1610      Number of Occurrences: Until Specified     Order Questions:      Meal: Breakfast      Meal: Dinner      Supplement: Boost Breeze [employer]      Supplement: Boost Breeze Peach      Supplement: Boost Pudding Chocolate  Supplement: Boost Pudding Vanilla     PO % consumed: 51 to 75%  Parenteral/Enteral Nutrition: none     Anthropometrics:   Height: 162.6 cm (5' 4'')  Admit Weight: 45.2 kg (99 lb 10.4 oz) (05/12/23 0640)  Current Weight: 48.5 kg (106 lb 14.8 oz)  BMI: 18.35  IBW: 54 kg/120 lbs  IBW %: 89  Adj BW (if applicable): 53 kg  UBW:  (UTA)  UBW %:         Weight History (last 5)  Weights Weight   04/23/2023   4:54 PM 49 kg   05/12/2023   6:40 AM 45.2 kg   05/12/2023   3:39 PM 44.9 kg   05/13/2023   5:09 AM 45.5 kg   05/13/2023   8:41 AM 48.5 kg       Estimated Nutrition Needs   Based on wt: 48.5 kg  Calories: 30 - 35 cal/kg = 1455 - 1697.5 cal/day  Protein: 1.2 - 1.5 g pro/kg = 58.2 - 72.75 g pro/day      Diet Education   Not appropriate at present         Malnutrition Assessment using AND/ASPEN Consensus Criteria   Malnutrition in the Context of: Mild-moderate inflammation/chronic disease   Energy Intake: <75% of estimated energy requirement for > or = 1 month; Supportive data: Pt has been having consistently poor intake over the last month   Weight Loss: > 5% in 1 month; Supportive data: There has been a 6lb (5.3%) weight loss over the last month.     Nutrition-Focused Physical Exam: 05/13/2023  Subcutaneous Fat Loss: Severe  Areas examined/observed: Orbital Upper, Arm  Muscle Loss: Severe  Areas examined/observed: Temple, Clavicle, Deltoid, Interosseous       Patient meets criteria for: Severe protein calorie malnutrition     Nutrition Assessment    Anthropometrics: Body mass index is 18.35 kg/m?Marland Kitchen Underweight BMI given current weight and height. Patient is a frequent admit who continues to lose weight prior to admission.     Nutrition: Patient is currently on a clear liquid/no red items diet. Fair inake noted per chart review. Intake prior to admission was poor with inability to eat full meals. Once diet is advanced, pt will be able to receive Boost Breeze oral nutrition supplement.     Tolerance/GI: Abdominal pain and diarrhea reported; pt with hx of GERD and SMA stenosis. No other GI related issues noted.     Labs: Reviewed; chem labs wnl.     Micronutrients:Vitamin B12 and B1 from 04/2023 wnl. Vitamin D low from 04/2023.     Skin: no pressure injuries      Recommendations / Care Plan      Continue current diet order: Clear Liquid/No red items   Boost Breeze TID and Prosource TID   Advance to Regular as soon as medically feasible     2.  Continue vitamin and mineral supplementation   50 mcg cholecalciferol once daily PO   Vitamin B complex capsule once daily PO (1 capsule contains 3 mg thiamine)   1,000 mg cyanocobalamin once daily PO     3. Consider nutrition support if pt is unable to have consistently good PO intake  PPN: D7%,AA4% @ 85 ml/hr  + 250 ILE daily    = 1312 kcals, 82 g protein, and ~1 g/kg lipids/day     4. Monitor lytes twice daily for refeeding risk     5. Monitor I/O, weekly weights, and labs as able  Author:  Jeni Salles, RD pager [phone]   05/13/2023 9:56 AM     Paged Resident  RD to monitor and follow-up per nutrition protocol

## 2023-05-13 NOTE — Progress Notes
05/13/23 0847   Time Calculation   Start Time 0847   No Indication for Acute PT Pt Independent and/or at baseline  (Per chart review and discussion with RN, pt is BMAT 4, ambulating independently with no acute PT needs. Will sign off from PT services.)

## 2023-05-14 ENCOUNTER — Ambulatory Visit: Payer: PRIVATE HEALTH INSURANCE

## 2023-05-14 DIAGNOSIS — Z1211 Encounter for screening for malignant neoplasm of colon: Secondary | ICD-10-CM

## 2023-05-14 LAB — Basic Metabolic Panel
ANION GAP: 10 mmol/L (ref 8–19)
CHLORIDE: 104 mmol/L (ref 96–106)
CREATININE: 1.06 mg/dL (ref 0.60–1.30)
SODIUM: 135 mmol/L (ref 135–146)

## 2023-05-14 LAB — Magnesium
MAGNESIUM: 1.9 meq/L (ref 1.4–1.9)
MAGNESIUM: 2.8 meq/L — ABNORMAL HIGH (ref 1.4–1.9)

## 2023-05-14 LAB — Phosphorus
PHOSPHORUS: 3.5 mg/dL (ref 2.3–4.4)
PHOSPHORUS: 3.6 mg/dL (ref 2.3–4.4)

## 2023-05-14 LAB — CBC: HEMATOCRIT: 32.5 — ABNORMAL LOW (ref 34.9–45.2)

## 2023-05-14 MED ORDER — NA SULFATE-K SULFATE-MG SULF 17.5-3.13-1.6 GM/177ML PO SOLN
0 refills | Status: AC
Start: 2023-05-14 — End: ?

## 2023-05-14 MED ADMIN — PANCRELIPASE (LIP-PROT-AMYL) 36000-114000 UNITS PO CPEP: 36000 [IU] | ORAL | @ 01:00:00 | Stop: 2023-05-16 | NDC 00032301613

## 2023-05-14 MED ADMIN — ASPIRIN 81 MG PO CHEW: 81 mg | ORAL | @ 15:00:00 | Stop: 2023-05-16 | NDC 66553000201

## 2023-05-14 MED ADMIN — HYDROMORPHONE HCL 1 MG/ML IJ SOLN: .5 mg | INTRAVENOUS | @ 07:00:00 | Stop: 2023-05-14 | NDC 00409128331

## 2023-05-14 MED ADMIN — CHOLESTYRAMINE 4 G PO PACK: 4 g | ORAL | @ 06:00:00 | Stop: 2023-05-16

## 2023-05-14 MED ADMIN — CHOLESTYRAMINE 4 G PO PACK: 4 g | ORAL | @ 23:00:00 | Stop: 2023-05-16

## 2023-05-14 MED ADMIN — HYDROMORPHONE HCL 1 MG/ML IJ SOLN: .5 mg | INTRAVENOUS | Stop: 2023-05-14 | NDC 00409128331

## 2023-05-14 MED ADMIN — ANALGESIC BALM 10-15 % EX CREA: TOPICAL | @ 23:00:00 | Stop: 2023-05-16

## 2023-05-14 MED ADMIN — HYOSCYAMINE SULFATE 0.125 MG PO TABS: 125 ug | ORAL | @ 06:00:00 | Stop: 2023-05-16

## 2023-05-14 MED ADMIN — HEPARIN SODIUM (PORCINE) 5000 UNIT/ML IJ SOLN: 5000 [IU] | SUBCUTANEOUS | @ 05:00:00 | Stop: 2023-05-16 | NDC 00409272330

## 2023-05-14 MED ADMIN — POLYETHYLENE GLYCOL 3350 17 G PO PACK: 17 g | ORAL | @ 06:00:00 | Stop: 2023-05-16

## 2023-05-14 MED ADMIN — ACETAMINOPHEN 500 MG PO TABS: 1000 mg | ORAL | @ 03:00:00 | Stop: 2023-05-16 | NDC 00904673061

## 2023-05-14 MED ADMIN — PANCRELIPASE (LIP-PROT-AMYL) 36000-114000 UNITS PO CPEP: 36000 [IU] | ORAL | @ 15:00:00 | Stop: 2023-05-16 | NDC 00032301613

## 2023-05-14 MED ADMIN — NICOTINE 7 MG/24HR TD PT24: 7 mg | TRANSDERMAL | @ 15:00:00 | Stop: 2023-05-16 | NDC 00536589488

## 2023-05-14 MED ADMIN — HYOSCYAMINE SULFATE 0.125 MG PO TABS: 125 ug | ORAL | Stop: 2023-05-16 | NDC 42192034001

## 2023-05-14 MED ADMIN — OXYCODONE HCL 5 MG PO TABS: 10 mg | ORAL | @ 19:00:00 | Stop: 2023-05-16 | NDC 68084035411

## 2023-05-14 MED ADMIN — CHOLESTYRAMINE 4 G PO PACK: 4 g | ORAL | @ 01:00:00 | Stop: 2023-05-16

## 2023-05-14 MED ADMIN — PANTOPRAZOLE SODIUM 40 MG PO TBEC: 40 mg | ORAL | @ 15:00:00 | Stop: 2023-05-16 | NDC 50268063911

## 2023-05-14 MED ADMIN — B COMPLEX PO CAPS: 1 | ORAL | @ 15:00:00 | Stop: 2023-06-12 | NDC 00536137801

## 2023-05-14 MED ADMIN — RANOLAZINE ER 500 MG PO TB12: 500 mg | ORAL | Stop: 2023-05-23 | NDC 60687054911

## 2023-05-14 MED ADMIN — RANOLAZINE ER 500 MG PO TB12: 500 mg | ORAL | @ 01:00:00 | Stop: 2023-05-16 | NDC 60687054911

## 2023-05-14 MED ADMIN — PANCRELIPASE (LIP-PROT-AMYL) 36000-114000 UNITS PO CPEP: 36000 [IU] | ORAL | @ 21:00:00 | Stop: 2023-06-12

## 2023-05-14 MED ADMIN — FLUTICASONE-SALMETEROL 250-50 MCG/ACT IN AEPB: 1 | RESPIRATORY_TRACT | @ 17:00:00 | Stop: 2023-05-16 | NDC 00054032756

## 2023-05-14 MED ADMIN — CHOLESTYRAMINE 4 G PO PACK: 4 g | ORAL | @ 18:00:00 | Stop: 2023-05-16

## 2023-05-14 MED ADMIN — HYOSCYAMINE SULFATE 0.125 MG PO TABS: 125 ug | ORAL | @ 18:00:00 | Stop: 2023-05-16 | NDC 42192034001

## 2023-05-14 MED ADMIN — ZZ IMS TEMPLATE: 150 mg | ORAL | @ 15:00:00 | Stop: 2023-06-12 | NDC 60687034911

## 2023-05-14 MED ADMIN — OXYCODONE HCL 5 MG PO TABS: 7.5 mg | ORAL | @ 03:00:00 | Stop: 2023-05-16 | NDC 68084035411

## 2023-05-14 MED ADMIN — OXYCODONE HCL 5 MG PO TABS: 10 mg | ORAL | @ 23:00:00 | Stop: 2023-05-16 | NDC 68084035411

## 2023-05-14 MED ADMIN — CLOPIDOGREL BISULFATE 75 MG PO TABS: 75 mg | ORAL | @ 15:00:00 | Stop: 2023-05-16 | NDC 68084053611

## 2023-05-14 MED ADMIN — VITAMIN B-12 500 MCG PO TABS: 1000 ug | ORAL | @ 15:00:00 | Stop: 2023-05-16 | NDC 50268085415

## 2023-05-14 MED ADMIN — ZZ IMS TEMPLATE: 150 mg | ORAL | @ 07:00:00 | Stop: 2023-05-16 | NDC 00904663861

## 2023-05-14 MED ADMIN — ANALGESIC BALM 10-15 % EX CREA: TOPICAL | @ 18:00:00 | Stop: 2023-05-16

## 2023-05-14 MED ADMIN — PREGABALIN 25 MG PO CAPS: 25 mg | ORAL | @ 15:00:00 | Stop: 2023-05-16 | NDC 60687047311

## 2023-05-14 MED ADMIN — OXYCODONE HCL 5 MG PO TABS: 7.5 mg | ORAL | @ 12:00:00 | Stop: 2023-05-16 | NDC 68084035411

## 2023-05-14 MED ADMIN — CHOLECALCIFEROL 25 MCG (1000 UT) PO TABS: 50 ug | ORAL | @ 15:00:00 | Stop: 2023-05-16

## 2023-05-14 MED ADMIN — ACETAMINOPHEN 500 MG PO TABS: 1000 mg | ORAL | @ 18:00:00 | Stop: 2023-05-23 | NDC 00904673061

## 2023-05-14 MED ADMIN — PANCRELIPASE (LIP-PROT-AMYL) 36000-114000 UNITS PO CPEP: 36000 [IU] | ORAL | Stop: 2023-05-16 | NDC 00032301613

## 2023-05-14 MED ADMIN — HEPARIN SODIUM (PORCINE) 5000 UNIT/ML IJ SOLN: 5000 [IU] | SUBCUTANEOUS | @ 15:00:00 | Stop: 2023-05-16 | NDC 00409272330

## 2023-05-14 MED ADMIN — HYOSCYAMINE SULFATE 0.125 MG PO TABS: 125 ug | ORAL | @ 15:00:00 | Stop: 2023-05-16 | NDC 42192034001

## 2023-05-14 MED ADMIN — ATORVASTATIN CALCIUM 20 MG PO TABS: 40 mg | ORAL | @ 05:00:00 | Stop: 2023-06-12 | NDC 00904629161

## 2023-05-14 MED ADMIN — FLUTICASONE-SALMETEROL 250-50 MCG/ACT IN AEPB: 1 | RESPIRATORY_TRACT | @ 05:00:00 | Stop: 2023-05-16

## 2023-05-14 MED ADMIN — POLYETHYLENE GLYCOL 3350 17 G PO PACK: 17 g | ORAL | @ 15:00:00 | Stop: 2023-05-16

## 2023-05-14 MED ADMIN — HYDROMORPHONE HCL 1 MG/ML IJ SOLN: .5 mg | INTRAVENOUS | @ 15:00:00 | Stop: 2023-05-14 | NDC 00409128331

## 2023-05-14 MED ADMIN — PREGABALIN 25 MG PO CAPS: 25 mg | ORAL | @ 05:00:00 | Stop: 2023-05-16 | NDC 60687047311

## 2023-05-14 MED ADMIN — CHOLESTYRAMINE 4 G PO PACK: 4 g | ORAL | @ 01:00:00 | Stop: 2023-05-23

## 2023-05-14 MED ADMIN — ACETAMINOPHEN 500 MG PO TABS: 1000 mg | ORAL | @ 12:00:00 | Stop: 2023-05-16 | NDC 00904673061

## 2023-05-14 MED ADMIN — ANALGESIC BALM 10-15 % EX CREA: TOPICAL | @ 15:00:00 | Stop: 2023-05-16

## 2023-05-14 MED ADMIN — RANOLAZINE ER 500 MG PO TB12: 500 mg | ORAL | @ 12:00:00 | Stop: 2023-05-16 | NDC 60687054911

## 2023-05-14 MED ADMIN — CHOLESTYRAMINE 4 G PO PACK: 4 g | ORAL | @ 15:00:00 | Stop: 2023-05-23

## 2023-05-14 MED ADMIN — ONDANSETRON HCL 4 MG PO TABS: 4 mg | ORAL | @ 03:00:00 | Stop: 2023-05-15 | NDC 50268062111

## 2023-05-14 MED ADMIN — ANALGESIC BALM 10-15 % EX CREA: TOPICAL | @ 06:00:00 | Stop: 2023-06-11

## 2023-05-14 MED ADMIN — LIDOCAINE 5 % EX PTCH: 3 | TRANSDERMAL | @ 18:00:00 | Stop: 2023-05-16

## 2023-05-14 MED ADMIN — DULOXETINE HCL 30 MG PO CPEP: 60 mg | ORAL | @ 15:00:00 | Stop: 2023-05-16 | NDC 60687073411

## 2023-05-14 NOTE — Progress Notes
[facility] DEPARTMENT OF MEDICINE  DIVISION OF GASTROENTEROLOGY  GENERAL GI CONSULT NOTE    PATIENT: Donna Gross  MRN: [id number]  DOB: 05-17-1966    ADMISSION DATE:     HOSPITAL DAY: 0  DATE OF SERVICE: 05/14/2023  PRIMARY TEAM ATTENDING: Sammie Bench, MD  REASON FOR CONSULT: abd pain, N/V, diarrhea    HPI/SUBJECTIVE  Donna Gross is a 57 y.o. female with GERD, pancreatitis, anxiety/depression, fibromyalgia, perforated gastric ulcer s/p Billroth 2 surgery (2016), partial colectomy for colonic perforation (2015) with colostomy s/p reversal, gallstone pancreatitis s/p cholecystectomy (03/27/2022) with c/f post-operative bile leak s/p ERCP (04/22/22) with empiric plastic biliary stent placement and removal (06/2022), SMA stenosis s/p recent SMA stenting (04/29/23) who presented with abdominal pain, nausea, poor PO intake and diarrhea for the past year.      She reports since her cholecystectomy 03/2022, she has had constant diffuse abdominal pain (primarily epigastric) that is worse with eating and improves with small movements, no change with bowel movements. Has post-prandial nausea/vomiting immediately after eating almost anything, denies dysphagia, does have heartburn due to recurrent emesis and sometimes with pink streaks to emesis. Takes TUMS sometimes which helps slightly. Also will have watery bowel movements 3-15 minutes after eating anything. Will usually start with a large volume watery stool, may need to go back again 5 minutes later with smaller bowel movements. Sometimes has urgency and doesn't have a bowel movement at all. Patient reports having 2 loose, water bowel movements yesterday, but sometimes as many as 13-14, sometimes nocturnal diarrhea. With rectal pain associated with BM and sometimes streaks of blood in stool; one time had red blood on toilet paper. Reported last colonoscopy ~3 years ago at [facility] with Dr. Lazarus Salines, doesn't remember the results. With associated 50-lb weight loss since her surgery (154 -> 106 lbs).      Takes zofran once in the morning. Also takes Percocet up to 4 times daily for pain. Denies NSAID use, not interested in acupuncture. Reports taking Creon 36K before meals and cholestyramine 4g TID. Denies having any abdominal pain or opioid use prior to cholecystectomy 03/2022.      ED presentation 05/2022, 07/2022, 11/2022, 01/2023 for ongoing symptoms.     Afebrile, HDS, WBC 9 -> 5, Hgb 12.2 -> 9.1, Plt 436, BMP wnl, hstrop 9 -> 10. CTAP w/ atherosclerosis w/ patent mesenteric vasculature. Interval placement of superior mesenteric artery stent, which is patent. Mild residual wall thickening ascending and transverse colon, mildly improved since prior exam, otherwise no acute findings. On pantoprazole qday, hyoscyamine QID, lidocaine patch, ATC tylenol, pregabalin BID, duloxetine 60 mg qday, ranolazine, dilaudid PRN, oxycodone PRN. Dilaudid IV x4, Oxy 7.5mg  x3, Zofran x1    Interval Events:  5/24: Tolerating PO, reporting improving pain. Patient would prefer outpatient colonoscopy.    PMH  Past Medical History:   Diagnosis Date    Anxiety     Depression     Emphysema, unspecified (HCC/RAF)     Emphysema, unspecified (HCC/RAF)     Fibromyalgia     Gastritis     GERD (gastroesophageal reflux disease)     History of blood transfusion     Intractable nausea and vomiting 01/23/2020    Pancreatitis      PSH  Past Surgical History:   Procedure Laterality Date    ABDOMINAL SURGERY      COLON SURGERY       MEDS  Home medications  Current Outpatient Medications   Medication Instructions    albuterol (  PROVENTIL, VENTOLIN) 2.5 mg, Nebulization, 3 times daily PRN    albuterol 90 mcg/act inhaler 2 puffs, Inhalation, Every 6 hours PRN    ASPIRIN LOW DOSE 81 mg, Oral, Daily    atorvastatin (LIPITOR) 40 mg, Oral, Every night at bedtime    B Complex Vitamins (VITAMIN B COMPLEX) capsule 1 capsule, Oral, Daily    CALCIUM ANTACID 500 mg, Oral, 2 times daily PRN, Calcium Carbonate 500 mg is equivalent to 200 mg elemental Calcium    cholestyramine (QUESTRAN) 4 g, Oral, 3 times daily with meals    clobetasol 0.05% cream Topical, 3 times daily PRN    clopidogrel (PLAVIX) 75 mg, Oral, Daily    cyanocobalamin (CYANOCOBALAMIN) 1,000 mcg, Oral, Daily    DULoxetine (CYMBALTA) 60 mg, Oral, Daily    fluticasone-salmeterol 250-50 mcg/act diskus inhaler 1 puff, Inhalation, 2 times daily    gabapentin (NEURONTIN) 300 mg, Oral, 3 times daily    hyoscyamine (LEVSIN) 125 mcg, Oral, Every 4 hours PRN    ipratropium-albuterol 20-100 mcg/act inhaler 2 puffs, Inhalation, 4 times daily PRN    naloxone 4 mg/0.1 mL nasal spray Call 911. Administer a single spray intranasally into one nostril for opioid overdose. May repeat in 3 minutes if patient is not breathing.    ondansetron (ZOFRAN) 4 mg, Oral, Every 6 hours PRN    oxyCODONE-acetaminophen 7.5-325 MG per tablet 1 tablet, Oral, Every 6 hours PRN    pancrelipase, Lip-Prot-Amyl, (CREON) 36000 units DR capsule 36,000 units of lipase, Oral, 3 times daily with meals    pantoprazole (PROTONIX) 40 mg, Oral, Daily    Polyethylene Glycol 3350 (PEG 3350) 17 GM/SCOOP POWD Mix 1 capful (17 g) in 6 to 8 oz of liquid and drink by mouth two (2) times daily.    prochlorperazine (COMPAZINE) 10 mg, Oral, Every 6 hours PRN    QUEtiapine 100 mg tablet Take 1 tablet (100mg  with the other strength for a  total of 150 mg) by mouth two (2) times daily.    ranolazine (RANEXA) 500 mg, Oral, 2 times daily    senna 8.6 mg tablet 1 tablet, Oral, Every night at bedtime PRN    Vitamin D3 50 mcg, Oral, Daily     Scheduled:  acetaminophen, 1,000 mg, Oral, TID  analgesic balm, , Topical, QID  aspirin, 81 mg, Oral, Daily  atorvastatin, 40 mg, Oral, QHS  cholestyramine, 4 g, Oral, TID w/meals & QHS  clopidogrel, 75 mg, Oral, Daily  cyanocobalamin, 1,000 mcg, Oral, Daily  DULoxetine, 60 mg, Oral, Daily  fluticasone-salmeterol, 1 puff, Inhalation, BID  heparin, 5,000 Units, Subcutaneous, BID  hyoscyamine, 125 mcg, Oral, QID  lidocaine, 3 patch, Transdermal, Q24H  nicotine patch, 7 mg, Transdermal, Daily  pancrelipase (Lip-Prot-Amyl), 36,000 units of lipase, Oral, TID w/meals  pantoprazole, 40 mg, Oral, Daily  polyethylene glycol, 17 g, Oral, BID  pregabalin, 25 mg, Oral, BID  QUEtiapine, 150 mg, Oral, BID  ranolazine, 500 mg, Oral, Q12H  vitamin B complex, 1 capsule, Oral, Daily  cholecalciferol, 50 mcg, Oral, Daily    PRN:  albuterol, calcium carbonate, loperamide, melatonin oral/enteral/sublingual, naloxone, ondansetron **OR** ondansetron injection/IVPB, oxyCODONE, oxyCODONE **OR** oxyCODONE, prochlorperazine, senna, traZODone    Infusions:    ALLERGIES  Hydrocodone, Ibuprofen, and Morphine    FMH  Family History   Problem Relation Age of Onset    Heart disease Mother     Diabetes Brother     Diabetes Father     Anesthesia problems Neg Hx  Malignant hypertension Neg Hx     Hypotension Neg Hx     Malignant hyperthermia Neg Hx     Pseudochol deficiency Neg Hx      SOCIAL HISTORY  Social History     Tobacco Use    Smoking status: Every Day     Types: Cigarettes    Smokeless tobacco: Current    Tobacco comments:     1 cig per day   Vaping Use    Vaping status: Never Used   Substance Use Topics    Alcohol use: No    Drug use: No     PHYSICAL EXAM    Temp:  [36.2 ?C (97.1 ?F)-36.8 ?C (98.2 ?F)] 36.5 ?C (97.7 ?F)  Heart Rate:  [59-89] 70  Resp:  [12-18] 16  BP: (88-106)/(56-91) 102/91  NBP Mean:  [68-97] 97  SpO2:  [95 %-98 %] 96 %  BMI Readings from Last 1 Encounters:   05/14/23 17.85 kg/m?     System Check if examined and normal Positive or additional negative findings   Constit  [x]  General appearance - normal     Eyes  []  Conjuctivae clear       HENMT  []  Normal Lips/teeth/gums, oropharynx, head     Neck  []  Inspection; no masses palpated     Resp  [x]  Effort good. No labored breathing     CV  []  Normal rhythm/rate   []  Normal pulses     Abdomen  [x]  Soft, non-tender, non-distended, no rebound guarding    []  Active bowel sounds 4 quadrants   []  No appreciable flank or shifting dullness   []  No hepatosplenomegaly   []  No umbilical hernia    Rectal   []  No external hemorrhoids or fissure   []  No masses palpated on DRE   []  Appropriate resting pressure/sphincter tone   []  Appropriate squeeze pressure   []  Appropriate bear down maneuver   []  Chaperone present for exam     MSK  []  Grossly normal strength, ROM     Neuro   []  Grossly normal      Psychiatric  [x]   Oriented to time, place and person   []   Appropriate insight/judgement & mood/affect           LABS/STUDIES    Labs Independently Interpreted by me today  Lab Results   Component Value Date    WBC 5.89 05/14/2023    HGB 9.9 (L) 05/14/2023    HCT 32.5 (L) 05/14/2023    MCV 85.5 05/14/2023    PLT 444 (H) 05/14/2023     Imaging Tests Independently Interpreted by me today     CTAP 05/12/23  IMPRESSION:  No evidence of acute abdominopelvic pathology.  Atherosclerosis with patent mesenteric vasculature. Interval placement of superior mesenteric artery stent, which is patent.  Mild residual wall thickening of the ascending and transverse colon, mildly improved since prior examination.    Procedures    EGD 01/28/23  FINDINGS:   The esophagus and GE junction were normal.  The gastric pouch was mildly erythematous with bilious fluid noted.  Billroth 2 anatomy was noted.  The GJ anastamosis appeared normal. The efferent and afferent limbs were intubated a short distance and both   appeared normal.  Biopsy taken with cold biopsy forceps from the stomach.  DIAGNOSIS:  Mild gastric erythema, otherwise normal endoscopy with normal Billroth 2 anatomy.    A&P  Felicha Frayne is a 57 y.o. female with GERD, pancreatitis, anxiety/depression,  fibromyalgia, perforated gastric ulcer s/p Billroth 2 surgery (2016), partial colectomy for colonic perforation (2015) with colostomy s/p reversal, gallstone pancreatitis s/p cholecystectomy (03/27/2022) with c/f post-operative bile leak s/p ERCP (04/22/22) with empiric plastic biliary stent placement and removal (06/2022), SMA stenosis s/p recent SMA stenting (04/29/23) who presented with abdominal pain, nausea, poor PO intake and diarrhea for the past year.     Since her cholecystectomy 03/2022, she complains of constant abdominal pain, worsened with PO intake, c/b post-prandial nausea/vomiting (3 times daily) and diarrhea (5-15 times daily, also with nocturnal symptoms) and rectal pain with intermittent rectal bleeding c/f fissure. Definitely has some component of abdominal wall pain with pain overlying central surgical scar and +Carnett's. Also high concern for functional dyspepsia and gut hypersensitivity. Query some dysmotility due to opioid use, though recognize that her opioid requirement only began after post-operative symptom onset. Lower c/f symptoms due to chronic pancreatitis due to relatively normal appearance of pancreas on imaging. Ruled out esophagitis on EGD, bx showing reactive antral gastropathy w/ no H pylori, no e/o obstruction on repeated CTs due to multiple abdominal surgeries c/b adhesions (never had UGIS). C diff PCR negative, bacterial/parasitic pathogen panel negative, pancreatic elastase 39, calprotectin 192, celiac serologies normal. C diff PCR negative, bacterial/parasitic pathogen panel negative, pancreatic elastase 39, calprotectin 192, celiac serologies normal. Query microscopic colitis iso SNRI use versus bile acid diarrhea, though doesn't notice improvement of diarrhea with cholestyramine use. Planned for colonoscopy for further evaluation, however patient reports she is unable to tolerate inpatient prep (GoLytely 4 L ) d/t volume required to drink. After discussion and shared decision-making, the patient will continue to consider attempting inpatient bowel prep and colonoscopy but would like to defer for now.     - Patient would like outpatient colonoscopy (we will schedule)  - Nutrition consult  - [facility] consultation for pain  - PO zofran 4mg  1st line, PO compazine 2nd line for nausea  - Hyoscyamine q4h PRN  - Loperamide 2mg  q6h PRN  - PO pantoprazole 40mg  daily  - Lidocaine patch / heat to central abdominal scar  - Continue Creon 36K with meals                - Will consider increasing dose  - Consider weaning opioids as able, though patient was previously very upset and not amenable to this plan  - Outpatient will consider TCA initiation  - Could consider SIBO testing outpatient  - We will sign off at this time     Code Status:  Full Code    Discussed with attending Dr. Darrall Dears.    I reviewed these specialist notes that directly related to the patient's acute and/or chronic medical problems with documentation of the salient findings, if any: Family Medicine    I have spoken with the primary team regarding the patient's care today. Our conclusions were as above.      Author:    Val Riles 05/14/2023   GI Fellow  If after 5pm or on weekends, please page GI fellow on call      The patient was seen and examined with the fellow, Dr. Selena Batten.  The case was discussed with the fellow, and I agree with the findings and plan of care we formulated together as documented in the fellow?s note.  Have placed order for outpatient colonoscopy and patient will be contacted to schedule or may contact my office for assistance.     Johny Chess, MD 05/17/2023 9:55 AM  Assistant Professor   [facility]

## 2023-05-14 NOTE — Progress Notes
Inpatient Family Medicine Progress Note    Primary Care Provider: Kelle Darting, MD  Attending Physician: Sammie Bench, MD  Junior Resident Physician: Lajoyce Corners, MD    Chief complaint:   Chief Complaint   Patient presents with    Abdominal Pain     Patient reports abdominal pain, nausea, vomiting, and diarrhea for four days. Patient reports that she dealt with this earlier this month but it has returned and worsened.      Subjective:     Overnight events:  - NAEON                     This AM:   - States pain is a little better than before  - Tolerated a quarter of sandwhich yesterday at lunch  - Says that she has lost weight since she was discharged because she hasn't been able to tolerate PO    Pt denies any fevers, chills, constipation, sob, or chest pain.    PRNs: Dilaudid IV x4, Oxy 5mg  x1, Oxy 7.5mg  x4, Zofran x2    Medications:     Continuous Medications:       Scheduled Medications:     acetaminophen  1,000 mg Oral TID    analgesic balm   Topical QID    aspirin  81 mg Oral Daily    atorvastatin  40 mg Oral QHS    cholestyramine  4 g Oral TID w/meals & QHS    clopidogrel  75 mg Oral Daily    cyanocobalamin  1,000 mcg Oral Daily    DULoxetine  60 mg Oral Daily    fluticasone-salmeterol  1 puff Inhalation BID    heparin  5,000 Units Subcutaneous BID    hyoscyamine  125 mcg Oral QID    lidocaine  3 patch Transdermal Q24H    nicotine patch  7 mg Transdermal Daily    pancrelipase (Lip-Prot-Amyl)  36,000 units of lipase Oral TID w/meals    pantoprazole  40 mg Oral Daily    polyethylene glycol  17 g Oral BID    pregabalin  25 mg Oral BID    QUEtiapine  150 mg Oral BID    ranolazine  500 mg Oral Q12H    vitamin B complex  1 capsule Oral Daily    cholecalciferol  50 mcg Oral Daily     PRN Medications: albuterol, calcium carbonate, HYDROmorphone, loperamide, melatonin oral/enteral/sublingual, naloxone, ondansetron **OR** ondansetron injection/IVPB, oxyCODONE **OR** oxyCODONE, prochlorperazine, senna, traZODone      Physical Exam:     Vital signs in last 24 hours:   Temp:  [36.2 ?C (97.1 ?F)-36.8 ?C (98.2 ?F)] 36.8 ?C (98.2 ?F)  Heart Rate:  [59-81] 64  Resp:  [12-18] 16  BP: (91-116)/(56-75) 91/62  NBP Mean:  [68-88] 72  SpO2:  [94 %-98 %] 98 %     I/O last 2 completed shifts:  In: 600 [P.O.:600]  Out: -     General: Alert, comfortable, thin-appearing, NAD  Head: Atraumatic, normocephalic  Eyes: PERRL. EOM intact. Sclera are anincteric and noninjected.  Mouth/Throat: Mucous membranes moist  Cardiac: RRR, nl S1/S2, no m/r/g, no thrills on palpation.   Lungs: Respiratory effort nl, CTAB, no crackles or wheezes  Abdomen: soft, non-distended. Non tender to palpation without rebound or guarding. Multiple surgical scars.  Extr: No lower extremity edema, warm and well-perfused.  Skin: Warm and dry. No significant rashes or lesions noted.  Neuro: A&O x4/4.   Psych: Normal mood and affect  Laboratory Data:     Recent Labs     05/13/23  1609 05/13/23  0454 05/12/23  1331 05/12/23  0658   WBC  --  5.79  --  9.41   HGB  --  9.1*  --  12.2   HCT  --  28.4*  --  38.3   MCV  --  84.0  --  84.0   PLT  --  436*  --  599*   NEUTPCT  --   --   --  73.0   NA 135 135   < > 135   K 4.5 4.3   < > 4.6   CL 103 103   < > 98   CO2 22 21   < > 21   BUN 12 14   < > 19   CREAT 1.16 0.99   < > 1.37*   MG 2.8* 1.4  --  1.5   CALCIUM 9.0 9.1   < > 10.4    < > = values in this interval not displayed.     Recent Labs     05/12/23  0658   ALT 11   AST 31   ALKPHOS 87   ALBUMIN 4.9     Recent Labs     05/12/23  0658   HGBA1C 5.4     No results for input(s): ''CHOL'', ''CHOLHDL'', ''CHOLDLCAL'', ''CHOLDLQ'', ''TRIGLY'' in the last 72 hours.  Recent Labs     05/12/23  2005 05/12/23  1331   TROPONIN 10* 9*     No results for input(s): ''PT'', ''INR'', ''APTT'' in the last 72 hours.  No results for input(s): ''BACULBLD'', ''BACULUR'', ''BACULRSP'' in the last 72 hours.  No results for input(s): ''GLUCOSEPOC'' in the last 72 hours.    Urinalysis:  Invalid input(s): ''URINEPH'', ''URINEWBC'', ''UAWBC''    Studies:     Imaging:   CT abd+pelvis angiogram wo+w contrast   Final Result by Erle Crocker., MD (05/22 1138)   IMPRESSION:      No evidence of acute abdominopelvic pathology.      Atherosclerosis with patent mesenteric vasculature. Interval placement of superior mesenteric artery stent, which is patent.      Mild residual wall thickening of the ascending and transverse colon, mildly improved since prior examination.            Signed by: Celesta Gentile   05/12/2023 11:38 AM          Assessment and Plan:   Donna Gross is a 57F with hx of anxiety, depression, emphysema, fibromyalgia, GERD, multiple abdominal surgeries including partial gastrectomy with gastrojejunostomy c/b adhesions, multiple hospitalizations for intractable abdominal pain due to SMA stenosis s/p recent SMA stenting (04/29/2023) here for intractable abdominal pain admitted for pain management.     #acute on chronic abdominal pain, POA  #SMA stenosis, chronic, POA  #celiac stenosis, POA  #CT evidence of CAD, POA  #s/p angio and stenting of SMA of 04/29/23  #multiple abdominal surgeries c/b adhesions, chronic, POA  #GERD, chronic, POA  Pt with long-standing history of abdominal pain for about 1 year. Has been admitted several times previously with similar complaints. CTAP without evidence of acute abdominopelvic pathology. Low concern for bowel ischemia given lactate negative and SMA stent patent. Query component of dysmotility due to opioid use, though opioid requirement began after onset of chronic symptoms. Low concern for pancreatitis or biliary etiology (s/p cholecystectomy) given normal lab and imaging findings. No evidence of obstruction though  has had multiple abdominal surgeries c/b adhesions. For pain, has been on about 45 MMEs at home. Considered more superficial process given positive Carnett's sign though difficult to interpret in this patient. Considered anterior cutaneous nerve entrapment syndrome though does not seem to be localized to horizontal or oblique orientation of nerve distribution. Considered diabetic thoracic polyradiculopathy though no history of diabetes (A1C 5.4% on admission) and no evidence of neuropathy. Overall most likely multifactorial from history of extensive abdominal surgeries, GERD, fibromyalgia, opioid dependence, pancreatic insufficiency, etc.  - GI consulted, appreciate recs  - Vascular surgery consulted, appreciate recs  - s/p fluid resuscitation, discontinued fluids  - ADAT, advanced to regular diet  - pain regimen:        - tylenol 1 g q8hr ATC       - pregabalin 25 mg BID       - home duloxetine 60 mg qday       - continue home ranolazine 500 mg BID        - topicals: lidocaine patch x3, analgesic balm       - oxycodone SS 5/7.5 q4h PRN       - dilaudid 0.5 mg q4h PRN for BTP -> oxy 10 for BTP       - pain management consult, appreciate recs  - continue home pantoprazole 40 mg daily  - integrative medicine consulted, appreciate recs  - E/W medicine consult declined by patient  - continue atorvastatin 40 mg at bedtime  - s/p SMA stenting: continue home aspirin 81 mg daily and plavix 75 mg daily  - Patient will need follow up at [facility]     #Mildly elevated Qtc, nPOA  EKG 5/24 Qtc 476  - CTM - spoke with pharm and okay to cont with prn zofran    - Consider switching to tigan PO if continuing to rise - spoke with pharm and it is available  - f/u am EKG    #diarrhea, chronic, intermittent, POA  #constipation, chronic, intermittent, POA  #nausea/vomiting, chronic, POA  #hx of pancreatic insufficiency, chronic, POA  Pt with chronic n/v/d for the past year. Has had negative work-up including c. Diff, celiac panel, H.pylori testing, parasitic enteric panel. Pt was unable to tolerate colonoscopy and had negative EGD.   - GI consult as above  - bowel regimen: miralax BID, senna PRN, bisacodyl suppository PRN  - continue home hyoscyamine 125 mcg QID   - continue home creon 36,000 units of lipase TID  - nausea: zofran 4 mg q8hrs PRN     #AKI, POA, resolved  Creatinine elevated above baseline ~1 up to 1.37 on admission, now resolved with fluid resuscitation. Likely secondary to poor PO intake and low volume status. FENa 0.6% on admission, consistent with pre-renal etiology.  - s/p fluid resuscitation  - trend BMP     #DOE, reported on admission  Reported by patient. No other respiratory s/sx. Low concern for PE in absence of chest pain or respiratory distress. Likely component of anemia, deconditioning, and cachexia.   - PT/OT consulted  - continuous pulse ox  - continuous cardiac monitoring     #Poor PO intake, chronic, POA  #acute weight loss, POA  #severe protein malnutrition, POA   #severe muscle loss with cachexia due to severe malnutrition, POA  Previously evaluated by nutrition who had recommended boost TID for assistance with malnutrition likely 2/2 poor PO intake from abdominal pain. Reports weight loss of approximately 50 lbs in the last  year. Discharged at 104 lbs previously after 10 days down to 99 lbs on this admission (05/12/23). Thiamine wnl. Vitamin D level low at 17 (04/24/2023).   - nutrition consulted, appreciate recs  - boost TID  - continue vitamin D supplementation  - consider initiation of appetite stimulant  - trend labs BID for risk of refeeding syndrome     #normocytic anemia, chronic, POA  Pt presents with hemoglobin 12.2 likely hemo-concentration. Baseline around 9-10, currently 9.1. Anemia though to be secondary to nutritional deficiencies from poor PO intake. Folate/B12 wnl (04/2023). Iron studies in 2021 c/f element of IDA, improved on repeat 04/2023.  - trend CBC daily  - consider iron supplementation as outpatient     #emphysema, POA  #tobacco use, POA  Previous PPD smoker from age 18 and reduced over last 7 years to 1 cigarette daily.   - nicotine patch 7 mg   - fluticasone-salmeterol inhaler 1 puff BID, albuterol PRN  - counseled on nicotine cessation #anxiety, chronic, POA   #major depression, chronic, POA  #fibromyalgia, chronic, POA  #insomnia, chronic, POA   All stable. Denies SI/HI.   - continue home seroquel 150 mg BID   - continue home duloxetine 60 mg daily   - continue home trazodone 50 mg qnightly PRN     #FEN/GI: regular diet     #Access: Peripheral IV 22 G Anterior;Left Forearm    Incision 04/29/23 Anterior;Proximal;Right (15)     #Prophylaxis: heparin SC     #Dispo: pending improvement in abdominal pain/nausea and pain management. Will need to establish care outpatient with [facility] care provider considering patient with multiple presentations for same    #Code: Full Code    DWA Sammie Bench, MD who agrees with the above assessment and plan unless otherwise noted in addendum.    Author:  Lajoyce Corners, MD 05/14/2023 6:55 AM      35 minutes were spent personally by me today on this encounter which include today's pre-visit review of the chart, obtaining appropriate history, performing an evaluation, documentation and discussion of management with details supported within the note for today's visit. The time documented was exclusive of any time spent on the separately billed procedure.             Sammie Bench

## 2023-05-14 NOTE — Progress Notes
Integrative Therapy Note Attempt    PATIENT: Donna Gross  MRN: [id number]    Treatment Date: 05/14/2023      Time Calculation   Patient Not Seen Due To: Patient asleep      Treatment Completed by: Ria Clock

## 2023-05-14 NOTE — Other
Patient's Clinical Goal:   Clinical Goal(s) for the Shift: vss  Identify possible barriers to advancing the care plan: consults  Stability of the patient: Moderately Unstable - medium risk of patient condition declining or worsening    Progression of Patient's Clinical Goal:   Pain continued  overnight, prn dilaudid 0.5 mg and oxy 7.5 mg  No episodes of n/v  Ax4  Bmat 3-4   Per gi: no acute findings; wean off opioids, f/u out pt, cont. Medications for abd symptoms.  Pt defer colonoscopy at this time.

## 2023-05-14 NOTE — Consults
CASE MANAGER ASSESSMENT      Admit ZOXW:[id number]    Date of Initial CM Assessment: 05/14/2023    Problems: Principal Problem:    Abdominal pain (POA: Yes)  Active Problems:    Intractable abdominal pain (POA: Yes)       Past Medical History:   Diagnosis Date    Anxiety     Depression     Emphysema, unspecified (HCC/RAF)     Emphysema, unspecified (HCC/RAF)     Fibromyalgia     Gastritis     GERD (gastroesophageal reflux disease)     History of blood transfusion     Intractable nausea and vomiting 01/23/2020    Pancreatitis     Past Surgical History:   Procedure Laterality Date    ABDOMINAL SURGERY      COLON SURGERY            Primary Care Physician:Butler, Josem Kaufmann, MD  Phone:[phone]      NEEDS ASSESSMENT:     Level of Function Prior to Admit: Self Care/Indep. W ADLs    Primary Living Situation: Lives w/Family         Pre-admission Living Situation: Home/Apartment                  Primary Support Systems: Family members          Does the patient have a Hotel manager System member participating in Discharge Planning?: Yes            Bathroom on Main Floor: Yes          Prior Treatments / Services: Home Health, DME       Home Health Agency Name: [facility]             DME Owned by Patient: Dan Humphreys        Who is your PCP?: Kelle Darting    Do you have your Primary Care Doctor's office number?: Yes         Do you need information/education regarding your medical condition?: No               Were you hospitalized in the last 30 days?: Yes        READMIT ASSESSMENT: (IF APPLICABLE)     Is this a planned readmission?: No             DISCHARGE ASSESSMENT:     Projected Date of Discharge: 05/15/2023    Anticipated Complex D/C?: No    Projected Discharge to: Home    Discharge Address: [address]  [place] [address]    Projected Discharge Needs: Home Health           Support Identified at Discharge: Child  Name of Discharge Support Person: Clemetine Marker Phone Number: [phone]     Who is available to transport you upon discharge?: Family Transportation, Need help Building services engineer of Patient's Transportation Options?: No       SDOH     Within the past 12 months, has a lack of transportation kept you from medical appointments, meetings, work, or from getting things needed for daily living? : No  How hard is it for you to pay for the very basics like food, housing, medical care, and heating?: Not very hard  How hard is it for you to pay for prescriptions or medical bills?: Not very hard  In the past 12 months has the electric, gas, oil, or water company  threatened to shut off services in your home?: No  In the last 12 months, was there a time when you were not able to pay the mortgage or rent on time?: No  In the last 12 months, how many places have you lived?: 1  In the last 12 months, was there a time when you did not have a steady place to sleep or slept in a shelter (including now)?: No                      Lowella Curb,  05/14/2023

## 2023-05-14 NOTE — Other
Patient's Clinical Goal:   Clinical Goal(s) for the Shift: VSS; safety; pain management  Identify possible barriers to advancing the care plan: None  Stability of the patient: Moderately Stable - low risk of patient condition declining or worsening   Progression of Patient's Clinical Goal: At 1315 -Patient arrived to room # 2432-B via bed from ER over flow. Patient oriented to room and  use of call light. Admitting policies and procedure of the unit explained, patient verbalized understanding. Patient  alert and oriented x 4  Patient is connected to SPO2 only with saturation 98% on room air. Assessment performed and 2 RN skin check done verified by RN Lorena. Vital signs taken and recorded. Patient started on regular diet, tolerated well. Pain management continue with Oxycodone PO and Dilaudid IVP. Bed remain in low and locked position, all needed items remain within reach including call light. Maintain safety care rounding.

## 2023-05-14 NOTE — Other
Patient's Clinical Goal:   Clinical Goal(s) for the Shift: VSS on RA, fall free, pain management  Identify possible barriers to advancing the care plan: none  Stability of the patient: Moderately Stable - low risk of patient condition declining or worsening   Progression of Patient's Clinical Goal:   Pt AAOx4, follows commands, OOB with stand by assist.  VSS on RA.  Pt tolerated PO, prn zofran given for nausea.  Prn oxycodone given for pain.  IV dilaudid given this morning, order discontinued and pt aware.  Loose stool x2 for the shift.    Q12h BMP.  GI w/u.  Pain management      0915:  pt is eating breakfast, refused 12 leads EKG at this time.  0940:  pt is eating breakfast, refused 12 leads EKG at this time.  1008:  pt is eating breakfast, refused 12 leads EKG at this time

## 2023-05-15 LAB — Basic Metabolic Panel
ANION GAP: 12 mmol/L (ref 8–19)
ESTIMATED GFR 2021 CKD-EPI: 54 mL/min/{1.73_m2} (ref 8–19)
ESTIMATED GFR 2021 CKD-EPI: 62 mL/min/{1.73_m2} (ref 8.6–10.4)
SODIUM: 134 mmol/L — ABNORMAL LOW (ref 135–146)
SODIUM: 134 mmol/L — ABNORMAL LOW (ref 135–146)
UREA NITROGEN: 11 mg/dL (ref 7–22)

## 2023-05-15 LAB — CBC: NUCLEATED RBC%, AUTOMATED: 0 (ref 34.9–45.2)

## 2023-05-15 LAB — Phosphorus
PHOSPHORUS: 3.8 mg/dL (ref 2.3–4.4)
PHOSPHORUS: 4.7 mg/dL — ABNORMAL HIGH (ref 2.3–4.4)
PHOSPHORUS: 4.7 mg/dL — ABNORMAL HIGH (ref 2.3–4.4)

## 2023-05-15 LAB — Magnesium
MAGNESIUM: 1.6 meq/L (ref 1.4–1.9)
MAGNESIUM: 1.8 meq/L (ref 1.4–1.9)
MAGNESIUM: 2.5 meq/L — ABNORMAL HIGH (ref 1.4–1.9)

## 2023-05-15 MED ADMIN — HYOSCYAMINE SULFATE 0.125 MG PO TABS: 125 ug | ORAL | @ 18:00:00 | Stop: 2023-06-11 | NDC 42192034001

## 2023-05-15 MED ADMIN — HEPARIN SODIUM (PORCINE) 5000 UNIT/ML IJ SOLN: 5000 [IU] | SUBCUTANEOUS | @ 04:00:00 | Stop: 2023-05-16

## 2023-05-15 MED ADMIN — MAGNESIUM SULFATE 4 GM/100ML IV SOLN: 4 g | INTRAVENOUS | @ 17:00:00 | Stop: 2023-05-15 | NDC 63323010601

## 2023-05-15 MED ADMIN — PANCRELIPASE (LIP-PROT-AMYL) 36000-114000 UNITS PO CPEP: 36000 [IU] | ORAL | @ 18:00:00 | Stop: 2023-05-16 | NDC 00032301613

## 2023-05-15 MED ADMIN — HYOSCYAMINE SULFATE 0.125 MG PO TABS: 125 ug | ORAL | @ 15:00:00 | Stop: 2023-05-16 | NDC 42192034001

## 2023-05-15 MED ADMIN — HEPARIN SODIUM (PORCINE) 5000 UNIT/ML IJ SOLN: 5000 [IU] | SUBCUTANEOUS | @ 15:00:00 | Stop: 2023-05-16 | NDC 00409272330

## 2023-05-15 MED ADMIN — PREGABALIN 25 MG PO CAPS: 25 mg | ORAL | @ 15:00:00 | Stop: 2023-05-16 | NDC 60687047311

## 2023-05-15 MED ADMIN — OXYCODONE HCL 5 MG PO TABS: 10 mg | ORAL | @ 18:00:00 | Stop: 2023-05-16 | NDC 68084035411

## 2023-05-15 MED ADMIN — ANALGESIC BALM 10-15 % EX CREA: TOPICAL | @ 15:00:00 | Stop: 2023-06-11

## 2023-05-15 MED ADMIN — ONDANSETRON HCL 4 MG/2ML IJ SOLN: 4 mg | INTRAVENOUS | @ 01:00:00 | Stop: 2023-05-15 | NDC 60505613000

## 2023-05-15 MED ADMIN — CHOLESTYRAMINE 4 G PO PACK: 4 g | ORAL | @ 15:00:00 | Stop: 2023-05-16 | NDC 27241013421

## 2023-05-15 MED ADMIN — NICOTINE 7 MG/24HR TD PT24: 7 mg | TRANSDERMAL | @ 15:00:00 | Stop: 2023-05-16

## 2023-05-15 MED ADMIN — PANTOPRAZOLE SODIUM 40 MG PO TBEC: 40 mg | ORAL | @ 15:00:00 | Stop: 2023-05-16 | NDC 50268063911

## 2023-05-15 MED ADMIN — NICOTINE 7 MG/24HR TD PT24: 7 mg | TRANSDERMAL | @ 16:00:00 | Stop: 2023-05-16 | NDC 00536589488

## 2023-05-15 MED ADMIN — ACETAMINOPHEN 500 MG PO TABS: 1000 mg | ORAL | @ 03:00:00 | Stop: 2023-05-16 | NDC 00904673061

## 2023-05-15 MED ADMIN — POLYETHYLENE GLYCOL 3350 17 G PO PACK: 17 g | ORAL | @ 15:00:00 | Stop: 2023-05-16

## 2023-05-15 MED ADMIN — CHOLESTYRAMINE 4 G PO PACK: 4 g | ORAL | @ 06:00:00 | Stop: 2023-05-16

## 2023-05-15 MED ADMIN — PANCRELIPASE (LIP-PROT-AMYL) 36000-114000 UNITS PO CPEP: 36000 [IU] | ORAL | @ 15:00:00 | Stop: 2023-05-16 | NDC 00032301613

## 2023-05-15 MED ADMIN — B COMPLEX PO CAPS: 1 | ORAL | @ 15:00:00 | Stop: 2023-05-16 | NDC 00536137801

## 2023-05-15 MED ADMIN — LACTATED RINGERS IV BOLUS: 500 mL | INTRAVENOUS | @ 20:00:00 | Stop: 2023-05-15 | NDC 00338011704

## 2023-05-15 MED ADMIN — POLYETHYLENE GLYCOL 3350 17 G PO PACK: 17 g | ORAL | @ 04:00:00 | Stop: 2023-05-16

## 2023-05-15 MED ADMIN — CHOLESTYRAMINE 4 G PO PACK: 4 g | ORAL | @ 19:00:00 | Stop: 2023-05-16 | NDC 27241013421

## 2023-05-15 MED ADMIN — ANALGESIC BALM 10-15 % EX CREA: TOPICAL | @ 06:00:00 | Stop: 2023-05-16

## 2023-05-15 MED ADMIN — RANOLAZINE ER 500 MG PO TB12: 500 mg | ORAL | @ 13:00:00 | Stop: 2023-05-16 | NDC 60687054911

## 2023-05-15 MED ADMIN — ZZ IMS TEMPLATE: 150 mg | ORAL | @ 16:00:00 | Stop: 2023-05-16 | NDC 60687034911

## 2023-05-15 MED ADMIN — FLUTICASONE-SALMETEROL 250-50 MCG/ACT IN AEPB: 1 | RESPIRATORY_TRACT | @ 04:00:00 | Stop: 2023-05-16

## 2023-05-15 MED ADMIN — HYDROMORPHONE HCL 1 MG/ML IJ SOLN: .5 mg | INTRAVENOUS | @ 13:00:00 | Stop: 2023-05-15 | NDC 00409128331

## 2023-05-15 MED ADMIN — OXYCODONE HCL 5 MG PO TABS: 10 mg | ORAL | @ 12:00:00 | Stop: 2023-05-16 | NDC 68084035411

## 2023-05-15 MED ADMIN — OXYCODONE HCL 5 MG PO TABS: 10 mg | ORAL | @ 03:00:00 | Stop: 2023-05-16 | NDC 68084035411

## 2023-05-15 MED ADMIN — LIDOCAINE 5 % EX PTCH: 3 | TRANSDERMAL | @ 18:00:00 | Stop: 2023-05-16

## 2023-05-15 MED ADMIN — ZZ IMS TEMPLATE: 150 mg | ORAL | @ 03:00:00 | Stop: 2023-06-12 | NDC 60687034911

## 2023-05-15 MED ADMIN — VITAMIN B-12 500 MCG PO TABS: 1000 ug | ORAL | @ 15:00:00 | Stop: 2023-06-12 | NDC 50268085415

## 2023-05-15 MED ADMIN — HEPARIN SODIUM (PORCINE) 5000 UNIT/ML IJ SOLN: 5000 [IU] | SUBCUTANEOUS | @ 03:00:00 | Stop: 2023-06-12 | NDC 00409272330

## 2023-05-15 MED ADMIN — CHOLECALCIFEROL 25 MCG (1000 UT) PO TABS: 50 ug | ORAL | @ 15:00:00 | Stop: 2023-05-16

## 2023-05-15 MED ADMIN — ANALGESIC BALM 10-15 % EX CREA: TOPICAL | @ 23:00:00 | Stop: 2023-05-16

## 2023-05-15 MED ADMIN — HYOSCYAMINE SULFATE 0.125 MG PO TABS: 125 ug | ORAL | @ 03:00:00 | Stop: 2023-05-16 | NDC 42192034001

## 2023-05-15 MED ADMIN — ASPIRIN 81 MG PO CHEW: 81 mg | ORAL | @ 15:00:00 | Stop: 2023-05-16 | NDC 66553000201

## 2023-05-15 MED ADMIN — OXYCODONE HCL 5 MG PO TABS: 10 mg | ORAL | @ 23:00:00 | Stop: 2023-05-16 | NDC 68084035411

## 2023-05-15 MED ADMIN — ATORVASTATIN CALCIUM 20 MG PO TABS: 40 mg | ORAL | @ 03:00:00 | Stop: 2023-06-12 | NDC 00904629161

## 2023-05-15 MED ADMIN — DULOXETINE HCL 30 MG PO CPEP: 60 mg | ORAL | @ 15:00:00 | Stop: 2023-05-16 | NDC 60687073411

## 2023-05-15 MED ADMIN — PREGABALIN 25 MG PO CAPS: 25 mg | ORAL | @ 03:00:00 | Stop: 2023-05-16 | NDC 60687047311

## 2023-05-15 MED ADMIN — CLOPIDOGREL BISULFATE 75 MG PO TABS: 75 mg | ORAL | @ 15:00:00 | Stop: 2023-05-16 | NDC 68084053611

## 2023-05-15 MED ADMIN — ACETAMINOPHEN 500 MG PO TABS: 1000 mg | ORAL | @ 18:00:00 | Stop: 2023-05-16

## 2023-05-15 MED ADMIN — FLUTICASONE-SALMETEROL 250-50 MCG/ACT IN AEPB: 1 | RESPIRATORY_TRACT | @ 16:00:00 | Stop: 2023-05-16 | NDC 00054032756

## 2023-05-15 MED ADMIN — ACETAMINOPHEN 500 MG PO TABS: 1000 mg | ORAL | @ 12:00:00 | Stop: 2023-05-16 | NDC 00904673061

## 2023-05-15 MED ADMIN — ANALGESIC BALM 10-15 % EX CREA: TOPICAL | @ 18:00:00 | Stop: 2023-05-16

## 2023-05-15 NOTE — Progress Notes
Inpatient Family Medicine Progress Note    Primary Care Provider: Kelle Darting, MD  Attending Physician: Sammie Bench, MD  Junior Resident Physician: Lorrine Kin, MD    Chief complaint:   Chief Complaint   Patient presents with    Abdominal Pain     Patient reports abdominal pain, nausea, vomiting, and diarrhea for four days. Patient reports that she dealt with this earlier this month but it has returned and worsened.      Subjective:   Overnight events:  - Required Oxy 10 x1                    This AM:   - Says that she has lost weight since she was discharged because she hasn't been able to tolerate PO  - She states, she doesn't feel ready to go home today, however tomorrow she may be ready    Pt denies any fevers, chills, constipation, sob, or chest pain.    PRNs: Dilaudid IV x1, Oxy 7.5mg  x1, Oxy 10 mg x2 Zofran x2  Medications:   Continuous Medications:   Scheduled Medications:     acetaminophen  1,000 mg Oral TID    analgesic balm   Topical QID    aspirin  81 mg Oral Daily    atorvastatin  40 mg Oral QHS    cholestyramine  4 g Oral TID w/meals & QHS    clopidogrel  75 mg Oral Daily    cyanocobalamin  1,000 mcg Oral Daily    DULoxetine  60 mg Oral Daily    fluticasone-salmeterol  1 puff Inhalation BID    heparin  5,000 Units Subcutaneous BID    hyoscyamine  125 mcg Oral QID    lidocaine  3 patch Transdermal Q24H    nicotine patch  7 mg Transdermal Daily    pancrelipase (Lip-Prot-Amyl)  36,000 units of lipase Oral TID w/meals    pantoprazole  40 mg Oral Daily    polyethylene glycol  17 g Oral BID    pregabalin  25 mg Oral BID    QUEtiapine  150 mg Oral BID    ranolazine  500 mg Oral Q12H    vitamin B complex  1 capsule Oral Daily    cholecalciferol  50 mcg Oral Daily     PRN Medications: albuterol, calcium carbonate, loperamide, melatonin oral/enteral/sublingual, naloxone, ondansetron **OR** ondansetron injection/IVPB, oxyCODONE, oxyCODONE **OR** oxyCODONE, prochlorperazine, senna, traZODone      Physical Exam:   Vital signs in last 24 hours:   Temp:  [36.3 ?C (97.3 ?F)-36.5 ?C (97.7 ?F)] 36.4 ?C (97.5 ?F)  Heart Rate:  [65-89] 65  Resp:  [15-18] 15  BP: (88-110)/(57-91) 101/57  NBP Mean:  [70-97] 72  SpO2:  [95 %-98 %] 98 %     I/O last 2 completed shifts:  In: 1200 [P.O.:960; Other:240]  Out: -     General: Alert, comfortable, thin-appearing, NAD  Head: Atraumatic, normocephalic  Eyes: PERRL. EOM intact. Sclera are anincteric and noninjected.  Mouth/Throat: Mucous membranes moist  Cardiac: RRR, nl S1/S2, no m/r/g, no thrills on palpation.   Lungs: Respiratory effort nl, CTAB, no crackles or wheezes  Abdomen: soft, non-distended. Non tender to palpation without rebound or guarding. Multiple surgical scars.  Extr: No lower extremity edema, warm and well-perfused.  Skin: Warm and dry. No significant rashes or lesions noted.  Neuro: A&O x4/4.   Psych: Normal mood and affect      Laboratory Data:  Recent Labs     05/14/23  1605 05/14/23  1003   WBC  --  5.89   HGB  --  9.9*   HCT  --  32.5*   MCV  --  85.5   PLT  --  444*   NA 137 138   K 4.4 4.8   CL 104 104   CO2 21 25   BUN 12 10   CREAT 1.17 1.06   MG 1.8 1.9   CALCIUM 8.9 9.4     No results for input(s): ''ALT'', ''AST'', ''BILITOT'', ''ALKPHOS'', ''ALBUMIN'' in the last 72 hours.    No results for input(s): ''TSH'', ''HGBA1C'' in the last 72 hours.    No results for input(s): ''CHOL'', ''CHOLHDL'', ''CHOLDLCAL'', ''CHOLDLQ'', ''TRIGLY'' in the last 72 hours.  Recent Labs     05/12/23  2005 05/12/23  1331   TROPONIN 10* 9*     No results for input(s): ''PT'', ''INR'', ''APTT'' in the last 72 hours.  No results for input(s): ''BACULBLD'', ''BACULUR'', ''BACULRSP'' in the last 72 hours.  No results for input(s): ''GLUCOSEPOC'' in the last 72 hours.    Urinalysis:  Invalid input(s): ''URINEPH'', ''URINEWBC'', ''UAWBC''    Studies:     Imaging:   CT abd+pelvis angiogram wo+w contrast   Final Result by Erle Crocker., MD (05/22 1138)   IMPRESSION:      No evidence of acute abdominopelvic pathology.      Atherosclerosis with patent mesenteric vasculature. Interval placement of superior mesenteric artery stent, which is patent.      Mild residual wall thickening of the ascending and transverse colon, mildly improved since prior examination.            Signed by: Celesta Gentile   05/12/2023 11:38 AM          Assessment and Plan:   Donna Gross is a 21F with hx of anxiety, depression, emphysema, fibromyalgia, GERD, multiple abdominal surgeries including partial gastrectomy with gastrojejunostomy c/b adhesions, multiple hospitalizations for intractable abdominal pain due to SMA stenosis s/p recent SMA stenting (04/29/2023) here for intractable abdominal pain admitted for pain management.     #acute on chronic abdominal pain, POA  #SMA stenosis, chronic, POA  #celiac stenosis, POA  #CT evidence of CAD, POA  #s/p angio and stenting of SMA of 04/29/23  #multiple abdominal surgeries c/b adhesions, chronic, POA  #GERD, chronic, POA  Pt with long-standing history of abdominal pain for about 1 year. Has been admitted several times previously with similar complaints. CTAP without evidence of acute abdominopelvic pathology. Low concern for bowel ischemia given lactate negative and SMA stent patent. Query component of dysmotility due to opioid use, though opioid requirement began after onset of chronic symptoms. Low concern for pancreatitis or biliary etiology (s/p cholecystectomy) given normal lab and imaging findings. No evidence of obstruction though has had multiple abdominal surgeries c/b adhesions. For pain, has been on about 45 MMEs at home. Considered more superficial process given positive Carnett's sign though difficult to interpret in this patient. Considered anterior cutaneous nerve entrapment syndrome though does not seem to be localized to horizontal or oblique orientation of nerve distribution. Considered diabetic thoracic polyradiculopathy though no history of diabetes (A1C 5.4% on admission) and no evidence of neuropathy. Overall most likely multifactorial from history of extensive abdominal surgeries, GERD, fibromyalgia, opioid dependence, pancreatic insufficiency, etc.  - GI consulted, appreciate recs  - Vascular surgery consulted, appreciate recs  - s/p fluid resuscitation, discontinued fluids  - Regular  diet  - pain regimen:        - tylenol 1 g q8hr ATC       - pregabalin 25 mg BID       - home duloxetine 60 mg qday       - continue home ranolazine 500 mg BID        - topicals: lidocaine patch x3, analgesic balm       - oxycodone SS 5/7.5/10 q4h PRN            - pain management consult, appreciate recs  - continue home pantoprazole 40 mg daily  - integrative medicine consulted, appreciate recs  - E/W medicine consult declined by patient  - continue atorvastatin 40 mg at bedtime  - s/p SMA stenting: continue home aspirin 81 mg daily and plavix 75 mg daily  - [facility] will need [facility]     #Mildly elevated Qtc, nPOA  EKG 5/24 Qtc 476 5/25 EKG QTc 538  - Stop Zofran   - Start Tigan PRN for n/v     #diarrhea, chronic, intermittent, POA  #constipation, chronic, intermittent, POA  #nausea/vomiting, chronic, POA  #hx of pancreatic insufficiency, chronic, POA  Pt with chronic n/v/d for the past year. Has had negative work-up including c. Diff, celiac panel, H.pylori testing, parasitic enteric panel. Pt was unable to tolerate colonoscopy and had negative EGD.   - GI consult as above  - bowel regimen: miralax BID, senna PRN, bisacodyl suppository PRN  - continue home hyoscyamine 125 mcg QID   - continue home creon 36,000 units of lipase TID  - nausea: zofran 4 mg q8hrs PRN     #AKI, POA, resolved  Creatinine elevated above baseline ~1 up to 1.37 on admission, now resolved with fluid resuscitation. Likely secondary to poor PO intake and low volume status. FENa 0.6% on admission, consistent with pre-renal etiology.  - s/p fluid resuscitation  - trend BMP     #DOE, reported on admission  Reported by patient. No other respiratory s/sx. Low concern for PE in absence of chest pain or respiratory distress. Likely component of anemia, deconditioning, and cachexia.   - PT/OT consulted  - continuous pulse ox  - continuous cardiac monitoring     #Poor PO intake, chronic, POA  #acute weight loss, POA  #severe protein malnutrition, POA   #severe muscle loss with cachexia due to severe malnutrition, POA  Previously evaluated by nutrition who had recommended boost TID for assistance with malnutrition likely 2/2 poor PO intake from abdominal pain. Reports weight loss of approximately 50 lbs in the last year. Discharged at 104 lbs previously after 10 days down to 99 lbs on this admission (05/12/23). Thiamine wnl. Vitamin D level low at 17 (04/24/2023).   - nutrition consulted, appreciate recs  - boost TID  - continue vitamin D supplementation  - consider initiation of appetite stimulant  - trend labs BID for risk of refeeding syndrome     #normocytic anemia, chronic, POA  Pt presents with hemoglobin 12.2 likely hemo-concentration. Baseline around 9-10, currently 9.1. Anemia though to be secondary to nutritional deficiencies from poor PO intake. Folate/B12 wnl (04/2023). Iron studies in 2021 c/f element of IDA, improved on repeat 04/2023.  - trend CBC daily  - consider iron supplementation as outpatient     #emphysema, POA  #tobacco use, POA  Previous PPD smoker from age 80 and reduced over last 7 years to 1 cigarette daily.   - nicotine  patch 7 mg   - fluticasone-salmeterol inhaler 1 puff BID, albuterol PRN  - counseled on nicotine cessation      #anxiety, chronic, POA   #major depression, chronic, POA  #fibromyalgia, chronic, POA  #insomnia, chronic, POA   All stable. Denies SI/HI.   - continue home seroquel 150 mg BID   - continue home duloxetine 60 mg daily   - continue home trazodone 50 mg qnightly PRN     #FEN/GI: regular diet     #Access: Peripheral IV 22 G Anterior;Left Forearm    Incision 04/29/23 Anterior;Proximal;Right (16)     #Prophylaxis: heparin SC     #Dispo: pending improvement in abdominal pain/nausea and pain management. Will need to establish care outpatient with [facility] considering patient with multiple presentations for same    #Code: Full Code    DWA Sammie Bench, MD who agrees with the above assessment and plan unless otherwise noted in addendum.    Author:  Lorrine Kin, MD 05/15/2023 7:12 AM    55 minutes were spent personally by me today on this encounter which include today's pre-visit review of the chart, obtaining appropriate history, performing an evaluation, documentation and discussion of management with details supported within the note for today's visit. The time documented was exclusive of any time spent on the separately billed procedure.             Sammie Bench

## 2023-05-15 NOTE — Other
Patient's Clinical Goal:   Clinical Goal(s) for the Shift: VSS on RA, fall free, pain management  Identify possible barriers to advancing the care plan: None  Stability of the patient: Moderately Stable - low risk of patient condition declining or worsening   Progression of Patient's Clinical Goal:     -Slept well through the night  -AAO x4  -O2 sated high 90s in room air. Vitals remain stable  -BMAT 4 with standby assist.  -Voiding in bathroom. No Bm in the shift  -Pain managed with Oxycodone 10 mg po and scheduled meds. However, patient requests calling MD for IV pain med at 0515. Oncall MD notified. Order obtained for Dilaudid IVP PRN x1    Safety maintained and all needs have addressed. Continue plan of care

## 2023-05-15 NOTE — Nursing Note
Notified 1st provider. Will come up to see pt. No orders as of yet. Possible bolus.

## 2023-05-16 DIAGNOSIS — R197 Diarrhea, unspecified: Secondary | ICD-10-CM

## 2023-05-16 LAB — Magnesium: MAGNESIUM: 1.7 meq/L (ref 1.4–1.9)

## 2023-05-16 LAB — Phosphorus: PHOSPHORUS: 4.2 mg/dL (ref 2.3–4.4)

## 2023-05-16 LAB — Basic Metabolic Panel: GLUCOSE: 78 mg/dL (ref 65–99)

## 2023-05-16 LAB — CBC: HEMATOCRIT: 30.5 — ABNORMAL LOW (ref 34.9–45.2)

## 2023-05-16 MED ORDER — ANALGESIC BALM 10-15 % EX CREA
Freq: Four times a day (QID) | TOPICAL | 0 refills | Status: AC
Start: 2023-05-16 — End: ?

## 2023-05-16 MED ORDER — PREGABALIN 25 MG PO CAPS
25 mg | ORAL_CAPSULE | Freq: Two times a day (BID) | ORAL | 0 refills | 30.00000 days | Status: AC
Start: 2023-05-16 — End: ?
  Filled 2023-05-17 (×2): qty 60, 30d supply, fill #0

## 2023-05-16 MED ORDER — OXYCODONE HCL 10 MG PO TABS
10 mg | ORAL_TABLET | Freq: Four times a day (QID) | ORAL | 0 refills | 10.00000 days | Status: AC | PRN
Start: 2023-05-16 — End: ?
  Filled 2023-05-17 (×2): qty 40, 10d supply, fill #0

## 2023-05-16 MED ORDER — PANTOPRAZOLE SODIUM 40 MG PO TBEC
40 mg | ORAL_TABLET | Freq: Every day | ORAL | 0 refills | 30.00 days | Status: AC
Start: 2023-05-16 — End: ?

## 2023-05-16 MED ORDER — PEG 3350 17 GM/SCOOP PO POWD
17 g | Freq: Two times a day (BID) | ORAL | 0 refills | 30 days | Status: AC
Start: 2023-05-16 — End: ?

## 2023-05-16 MED ORDER — LIDOCAINE 5 % EX PTCH
3 | MEDICATED_PATCH | Freq: Every day | TRANSDERMAL | 0 refills | 10.00 days | Status: AC
Start: 2023-05-16 — End: ?
  Filled 2023-05-17: qty 30, 10d supply, fill #0

## 2023-05-16 MED ORDER — SENNOSIDES 8.6 MG PO TABS
1 | ORAL_TABLET | Freq: Every evening | ORAL | 0 refills | 60.00 days | Status: AC | PRN
Start: 2023-05-16 — End: ?

## 2023-05-16 MED ORDER — LOPERAMIDE HCL 2 MG PO CAPS
2 mg | ORAL_CAPSULE | Freq: Four times a day (QID) | ORAL | 0 refills | 8.00000 days | Status: AC | PRN
Start: 2023-05-16 — End: ?
  Filled 2023-05-17: qty 30, 8d supply, fill #0

## 2023-05-16 MED ORDER — ONDANSETRON HCL 4 MG PO TABS
4 mg | ORAL_TABLET | Freq: Four times a day (QID) | ORAL | 0 refills | 8.00 days | Status: AC | PRN
Start: 2023-05-16 — End: ?
  Filled 2023-05-17: qty 30, 8d supply, fill #0

## 2023-05-16 MED ORDER — PANCRELIPASE (LIP-PROT-AMYL) 36000-114000 UNITS PO CPEP
36000 [IU] | ORAL_CAPSULE | Freq: Three times a day (TID) | ORAL | 0 refills | 30.00000 days | Status: AC
Start: 2023-05-16 — End: ?

## 2023-05-16 MED ORDER — HYOSCYAMINE SULFATE 0.125 MG PO TABS
125 ug | ORAL_TABLET | ORAL | 0 refills | 5.00 days | Status: AC | PRN
Start: 2023-05-16 — End: ?
  Filled 2023-05-17: qty 30, 5d supply, fill #0

## 2023-05-16 MED ORDER — NALOXONE HCL 4 MG/0.1ML NA LIQD
1 refills | 1.00 days | Status: AC
Start: 2023-05-16 — End: ?
  Filled 2023-05-17 (×2): qty 2, 1d supply, fill #0

## 2023-05-16 MED ORDER — ACETAMINOPHEN 500 MG PO TABS
1000 mg | ORAL_TABLET | Freq: Four times a day (QID) | ORAL | 0 refills | 8.00 days | Status: AC | PRN
Start: 2023-05-16 — End: ?
  Filled 2023-05-17: qty 60, 8d supply, fill #0

## 2023-05-16 MED ADMIN — OXYCODONE HCL 5 MG PO TABS: 10 mg | ORAL | @ 12:00:00 | Stop: 2023-05-16 | NDC 68084035411

## 2023-05-16 MED ADMIN — CLOPIDOGREL BISULFATE 75 MG PO TABS: 75 mg | ORAL | @ 16:00:00 | Stop: 2023-05-16 | NDC 68084053611

## 2023-05-16 MED ADMIN — POLYETHYLENE GLYCOL 3350 17 G PO PACK: 17 g | ORAL | @ 16:00:00 | Stop: 2023-05-16 | NDC 60687043199

## 2023-05-16 MED ADMIN — ATORVASTATIN CALCIUM 20 MG PO TABS: 40 mg | ORAL | @ 03:00:00 | Stop: 2023-05-16 | NDC 00904629161

## 2023-05-16 MED ADMIN — PREGABALIN 25 MG PO CAPS: 25 mg | ORAL | @ 03:00:00 | Stop: 2023-05-16 | NDC 60687047311

## 2023-05-16 MED ADMIN — POLYETHYLENE GLYCOL 3350 17 G PO PACK: 17 g | ORAL | @ 16:00:00 | Stop: 2023-05-16

## 2023-05-16 MED ADMIN — LACTATED RINGERS IV BOLUS: 500 mL | INTRAVENOUS | @ 08:00:00 | Stop: 2023-05-16 | NDC 00338011704

## 2023-05-16 MED ADMIN — ANALGESIC BALM 10-15 % EX CREA: TOPICAL | @ 16:00:00 | Stop: 2023-05-16

## 2023-05-16 MED ADMIN — HYOSCYAMINE SULFATE 0.125 MG PO TABS: 125 ug | ORAL | Stop: 2023-06-11 | NDC 42192034001

## 2023-05-16 MED ADMIN — ACETAMINOPHEN 500 MG PO TABS: 1000 mg | ORAL | @ 03:00:00 | Stop: 2023-05-16 | NDC 00904673061

## 2023-05-16 MED ADMIN — ACETAMINOPHEN 500 MG PO TABS: 1000 mg | ORAL | @ 12:00:00 | Stop: 2023-05-16 | NDC 00904673061

## 2023-05-16 MED ADMIN — FLUTICASONE-SALMETEROL 250-50 MCG/ACT IN AEPB: 1 | RESPIRATORY_TRACT | @ 03:00:00 | Stop: 2023-05-16

## 2023-05-16 MED ADMIN — RANOLAZINE ER 500 MG PO TB12: 500 mg | ORAL | Stop: 2023-05-16 | NDC 60687054911

## 2023-05-16 MED ADMIN — RANOLAZINE ER 500 MG PO TB12: 500 mg | ORAL | @ 12:00:00 | Stop: 2023-05-16 | NDC 60687054911

## 2023-05-16 MED ADMIN — B COMPLEX PO CAPS: 1 | ORAL | @ 16:00:00 | Stop: 2023-05-16 | NDC 00536137801

## 2023-05-16 MED ADMIN — CHOLECALCIFEROL 25 MCG (1000 UT) PO TABS: 50 ug | ORAL | @ 16:00:00 | Stop: 2023-05-16

## 2023-05-16 MED ADMIN — PANCRELIPASE (LIP-PROT-AMYL) 36000-114000 UNITS PO CPEP: 36000 [IU] | ORAL | @ 16:00:00 | Stop: 2023-05-16 | NDC 00032301613

## 2023-05-16 MED ADMIN — TRAZODONE HCL 25 MG PO TABS: 50 mg | ORAL | @ 03:00:00 | Stop: 2023-05-16

## 2023-05-16 MED ADMIN — HYDROMORPHONE HCL 2 MG PO TABS: 1 mg | ORAL | @ 14:00:00 | Stop: 2023-05-16 | NDC 00406324301

## 2023-05-16 MED ADMIN — VITAMIN B-12 500 MCG PO TABS: 1000 ug | ORAL | @ 16:00:00 | Stop: 2023-05-16 | NDC 50268085415

## 2023-05-16 MED ADMIN — HEPARIN SODIUM (PORCINE) 5000 UNIT/ML IJ SOLN: 5000 [IU] | SUBCUTANEOUS | @ 03:00:00 | Stop: 2023-05-16 | NDC 00409272330

## 2023-05-16 MED ADMIN — FLUTICASONE-SALMETEROL 250-50 MCG/ACT IN AEPB: 1 | RESPIRATORY_TRACT | @ 16:00:00 | Stop: 2023-05-16

## 2023-05-16 MED ADMIN — ANALGESIC BALM 10-15 % EX CREA: TOPICAL | @ 19:00:00 | Stop: 2023-05-16

## 2023-05-16 MED ADMIN — ANALGESIC BALM 10-15 % EX CREA: TOPICAL | @ 03:00:00 | Stop: 2023-05-16

## 2023-05-16 MED ADMIN — CHOLESTYRAMINE 4 G PO PACK: 4 g | ORAL | @ 16:00:00 | Stop: 2023-05-16 | NDC 27241013421

## 2023-05-16 MED ADMIN — ZZ IMS TEMPLATE: 150 mg | ORAL | @ 17:00:00 | Stop: 2023-05-16 | NDC 00904663861

## 2023-05-16 MED ADMIN — HYOSCYAMINE SULFATE 0.125 MG PO TABS: 125 ug | ORAL | @ 03:00:00 | Stop: 2023-05-16 | NDC 42192034001

## 2023-05-16 MED ADMIN — HEPARIN SODIUM (PORCINE) 5000 UNIT/ML IJ SOLN: 5000 [IU] | SUBCUTANEOUS | @ 16:00:00 | Stop: 2023-05-16 | NDC 00409272330

## 2023-05-16 MED ADMIN — POLYETHYLENE GLYCOL 3350 17 G PO PACK: 17 g | ORAL | @ 03:00:00 | Stop: 2023-05-16

## 2023-05-16 MED ADMIN — OXYCODONE HCL 5 MG PO TABS: 10 mg | ORAL | @ 17:00:00 | Stop: 2023-05-16 | NDC 68084035411

## 2023-05-16 MED ADMIN — PANTOPRAZOLE SODIUM 40 MG PO TBEC: 40 mg | ORAL | @ 16:00:00 | Stop: 2023-05-16 | NDC 50268063911

## 2023-05-16 MED ADMIN — NICOTINE 7 MG/24HR TD PT24: 7 mg | TRANSDERMAL | @ 16:00:00 | Stop: 2023-05-16 | NDC 00536589488

## 2023-05-16 MED ADMIN — OXYCODONE HCL 5 MG PO TABS: 10 mg | ORAL | @ 03:00:00 | Stop: 2023-05-16 | NDC 68084035411

## 2023-05-16 MED ADMIN — CHOLESTYRAMINE 4 G PO PACK: 4 g | ORAL | @ 03:00:00 | Stop: 2023-05-16 | NDC 27241013421

## 2023-05-16 MED ADMIN — PANCRELIPASE (LIP-PROT-AMYL) 36000-114000 UNITS PO CPEP: 36000 [IU] | ORAL | Stop: 2023-05-16 | NDC 00032301613

## 2023-05-16 MED ADMIN — CHOLESTYRAMINE 4 G PO PACK: 4 g | ORAL | Stop: 2023-05-16 | NDC 27241013421

## 2023-05-16 MED ADMIN — ZZ IMS TEMPLATE: 150 mg | ORAL | @ 03:00:00 | Stop: 2023-05-16 | NDC 60687034911

## 2023-05-16 MED ADMIN — ASPIRIN 81 MG PO CHEW: 81 mg | ORAL | @ 16:00:00 | Stop: 2023-05-16 | NDC 66553000201

## 2023-05-16 MED ADMIN — HYOSCYAMINE SULFATE 0.125 MG PO TABS: 125 ug | ORAL | @ 16:00:00 | Stop: 2023-05-16 | NDC 42192034001

## 2023-05-16 MED ADMIN — DULOXETINE HCL 30 MG PO CPEP: 60 mg | ORAL | @ 16:00:00 | Stop: 2023-05-16 | NDC 60687073411

## 2023-05-16 MED ADMIN — PREGABALIN 25 MG PO CAPS: 25 mg | ORAL | @ 16:00:00 | Stop: 2023-05-16 | NDC 60687047311

## 2023-05-16 NOTE — Progress Notes
CLINICAL SOCIAL WORKER PROGRESS NOTE      Admit Date:    Date of CSW Assessment: 05/16/2023     REFERRAL INFORMATION:   Date Seen: (P) 05/16/23  Referral Source: (P) MD  Reason for Referral: (P) Transportation/Parking Assistance  Source of Information: (P) Consultation  Consult Source: (Demetrius Charity) MD  Does the patient have a Family/Support System member participating in Discharge Planning?: Yes    CLINICAL ASSESSMENT:   MD requested for CSW to assist with transportation for Pt to return home today. CSW contacted [facility], [phone], confirmed transport for 12pm, confirmation # [id number]. Pt lives at home with daughter, however, Pt reports family is not home at this time. Pt denies needing further support or resources at this time prior to discharge.     Interventions: (P) Discussed role of CSW and informed CSW availability for support and resources, Discussed resources available  Plan/Recommendations: (P) Discharge to Home    Projected Date of Discharge: 05/16/2023    SDOH INTERVENTIONS:        Oley Balm., LCSW,  05/16/2023

## 2023-05-16 NOTE — Nursing Note
Pt discharged home via ambulance. A&O4 with no distress noted. Ambulatory . Discharge medications given to Pt. All belonging sent with Pt. All discharge instructions given to patient, verbalized understanding.

## 2023-05-16 NOTE — Other
Patient's Clinical Goal:   Clinical Goal(s) for the Shift: stable vitals, safety, pain relief, good rest  Identify possible barriers to advancing the care plan: none  Stability of the patient: Moderately Stable - low risk of patient condition declining or worsening   Progression of Patient's Clinical Goal: pt on oxy 10 q4h. Asks for dilaudid but md told her they stopped it because pt should be going home tomorrow. 500cc bolus of LR given for low bp. 4gm mag bag given. Plan is to dc tomorrow.

## 2023-05-16 NOTE — Progress Notes
Pharmaceutical Services - Meds to [facility] Discharge Medication Reconciliation and Counseling Note    Patient Name: Donna Gross  Medical Record Number: [id number]  Admit date:      Age: 57 y.o.  Sex: female  Allergies: Hydrocodone, Ibuprofen, and Morphine    Preferred Pharmacy:   [id number] [place], [place] - [address] AT corner of [address]  [address] [place] [address]         I reconciled the discharge medications and counseled the patient on all new prescriptions. Discharge prescriptions were  picked up by discharge lounge rn anna around 10:25 to be delivered to bedside . The purpose, potential side effects, storage, missed doses and any special instructions related to each medication was discussed. Medications continued from home were also reviewed with the patient.    Discharge Medication List from AVS:     Changes To My Medications        START taking these medications        Dose Instructions Notes   acetaminophen 500 mg Tabs tablet   1,000 mg   Take 2 tablets (1,000 mg total) by mouth every six (6) hours as needed for Pain.      analgesic balm 10-15 % cream    Apply topically four (4) times daily.      lidocaine 5% patch  Commonly known as: Lidoderm   3 patch   Place 3 patches onto the skin daily 12 hours on 12 hours off.      loperamide 2 mg capsule  Commonly known as: Imodium   2 mg   Take 1 capsule (2 mg total) by mouth four (4) times daily as needed for Diarrhea.      oxyCODONE 10 mg tablet   10 mg   Take 1 tablet (10 mg total) by mouth every six (6) hours as needed. Max Daily Amount: 40 mg      pregabalin 25 mg capsule  Commonly known as: Lyrica   25 mg   Take 1 capsule (25 mg total) by mouth two (2) times daily. Max Daily Amount: 50 mg      sodium sulfate-potassium sulfate-magnesium sulfate solution  Commonly known as: Suprep    At 6pm the evening before colonoscopy, drink 16oz x1 dose, then drink 32oz water over 1 hour. At 5am the day of colonoscopy, repeat above. CHANGE how you take these medications        Dose Instructions Notes   albuterol 90 mcg/act inhaler  Commonly known as: Proventil HFA, Ventolin HFA  What changed: Another medication with the same name was removed. Continue taking this medication, and follow the directions you see here.   2 puff   Inhale 2 puffs every six (6) hours as needed for Wheezing or Shortness of Breath.      PEG 3350 17 GM/SCOOP Powd  What changed:   when to take this  reasons to take this   17 g   Mix 1 capful (17 g) in 6 to 8 oz of liquid and drink by mouth two (2) times daily.             CONTINUE taking these medications        Dose Instructions Notes   ASPIRIN LOW DOSE 81 MG chewable tablet  Generic drug: aspirin   81 mg   Chew 1 tablet (81 mg total) by mouth daily.      atorvastatin 40 mg tablet  Commonly known  as: Lipitor   40 mg   Take 1 tablet (40 mg total) by mouth at bedtime.      CALCIUM ANTACID 500 MG chewable tablet  Generic drug: calcium carbonate   500 mg   Chew 1 tablet (500 mg total) by mouth two (2) times daily as needed Calcium Carbonate 500 mg is equivalent to 200 mg elemental Calcium.      cholestyramine 4 g/dose powder  Commonly known as: Questran   4 g   Take 1 packet (4 g total) by mouth three (3) times daily with meals.      clobetasol 0.05% cream  Commonly known as: Temovate    Apply topically three (3) times daily as needed (rash).      clopidogrel 75 mg tablet  Commonly known as: Plavix   75 mg   Take 1 tablet (75 mg total) by mouth daily.      cyanocobalamin 1000 mcg tablet  Commonly known as: Cyanocobalamin   1,000 mcg   Take 1 tablet (1,000 mcg total) by mouth daily.      DULoxetine 60 mg DR capsule  Commonly known as: Cymbalta   60 mg   Take 1 capsule (60 mg total) by mouth daily.      fluticasone-salmeterol 250-50 mcg/act diskus inhaler  Commonly known as: Advair   1 puff   Inhale 1 puff two (2) times daily.      hyoscyamine 0.125 mg tablet  Commonly known as: Levsin   125 mcg   Take 1 tablet (125 mcg total) by mouth every four (4) hours as needed for Cramping.      ipratropium-albuterol 20-100 mcg/act inhaler  Commonly known as: Respimat   2 puff   Inhale 2 puffs four (4) times daily as needed for Wheezing or Shortness of Breath.      naloxone 4 mg/0.1 mL nasal spray  Commonly known as: Narcan    Call 911. Administer a single spray intranasally into one nostril for opioid overdose. May repeat in 3 minutes if patient is not breathing..      ondansetron 4 mg tablet  Commonly known as: Zofran   4 mg   Take 1 tablet (4 mg total) by mouth every six (6) hours as needed for Nausea or Vomiting.      pancrelipase (Lip-Prot-Amyl) 36000 units DR capsule  Commonly known as: CREON   36,000 units of lipase   Take 1 capsule (36,000 units of lipase total) by mouth three (3) times daily with meals.      pantoprazole 40 mg DR tablet  Commonly known as: Protonix   40 mg   Take 1 tablet (40 mg total) by mouth daily.      QUEtiapine 100 mg tablet  Commonly known as: SEROquel   150 mg   Take 1 tablet (100mg  with the other strength for a  total of 150 mg) by mouth two (2) times daily.      ranolazine 500 mg 12 hr tablet  Commonly known as: Ranexa   500 mg   Take 1 tablet (500 mg total) by mouth two (2) times daily.      senna 8.6 mg tablet   1 tablet   Take 1 tablet by mouth at bedtime as needed for Constipation.      vitamin B complex capsule   1 capsule   Take 1 capsule by mouth daily.      Vitamin D3 50 mcg (2000 units) Tabs   50 mcg  Take 1 tablet (50 mcg total) by mouth daily.             STOP taking these medications        STOP taking these medications   gabapentin 300 mg capsule  Commonly known as: Neurontin      oxyCODONE-acetaminophen 7.5-325 MG per tablet  Commonly known as: Percocet      prochlorperazine 10 mg tablet  Commonly known as: Compazine                Prescriptions        These medications were sent to [id number], [place] - [address] AT corner of [address]  [address] [place], [place] [address]      Hours: 24-hours Phone: [phone]   sodium sulfate-potassium sulfate-magnesium sulfate solution       These medications were sent to [facility] [phone] [address], [place] [address]      Hours: Mon-Fri 8AM-6PM, Saturdays & Holidays 8AM-5PM (Closed 1PM-2PM for lunch); Closed Sundays Phone: [phone]   acetaminophen 500 mg Tabs tablet  hyoscyamine 0.125 mg tablet  lidocaine 5% patch  loperamide 2 mg capsule  ondansetron 4 mg tablet  oxyCODONE 10 mg tablet  pregabalin 25 mg capsule         Patient aware of medications need to continue/stop such as gabapentin    Patient declined the following. Has at home from prior admission and expressed familiarity on how and when to use  naloxone 4 mg/0.1 mL nasal spray    The following is OTC/not stocked at pharmacy:  analgesic balm 10-15 % cream    The following were too soon through insurance. Pt confirmed has supply at home and will have local pharmacy call for transfer if needed.  pancrelipase (Lip-Prot-Amyl) 36000 units DR capsule  PEG 3350 17 GM/SCOOP Powd  pantoprazole 40 mg DR tablet  senna 8.6 mg tablet    Carlene Coria, PharmD, 05/16/2023, 10:36 AM

## 2023-05-16 NOTE — Progress Notes
05/16/23 1444   Time Calculation   Start Time [id number]   Patient not seen due to Patient deferred treatment   Chart accessed for Treatment scheduling or assignment

## 2023-05-16 NOTE — Other
Patient's Clinical Goal:   Clinical Goal(s) for the Shift: VSS, safety, comfort, rest  Identify possible barriers to advancing the care plan: none  Stability of the patient: Moderately Stable - low risk of patient condition declining or worsening   Progression of Patient's Clinical Goal:     A&Ox4. bolus given for low BP, asymptomatic. No acute changes in status. Cont pulse ox on and audible.    Pain managed with Oxycodone PO, spot dose dilaudid PO x1.    Voiding freely. Passing gas.    Frequent repositioning encouraged. Skin assessed, no pressure injuries noted.     Bed in low and locked position. Safety maintained and all needs attended to. Call light within reach at all times.     Plan to d/c today. Refused AM lab draw.    Will endorse to oncoming RN.

## 2023-05-17 MED FILL — POLYETHYLENE GLYCOL 3350 17 GM/SCOOP PO POWD: 17 GM/SCOOP | ORAL | 30 days supply | Qty: 510 | Fill #0

## 2023-05-17 MED FILL — SENNA 8.6 MG PO TABS: 8.6 mg | ORAL | 60 days supply | Qty: 60 | Fill #0

## 2023-05-17 MED FILL — PANTOPRAZOLE SODIUM 40 MG PO TBEC: 40 mg | ORAL | 30 days supply | Qty: 30 | Fill #0

## 2023-05-17 MED FILL — CREON 36000-114000 UNITS PO CPEP: 36000 units | ORAL | 30 days supply | Qty: 90 | Fill #0

## 2023-05-28 ENCOUNTER — Ambulatory Visit: Payer: PRIVATE HEALTH INSURANCE | Attending: Vascular Surgery

## 2023-05-28 IMAGING — MR MRI KNEE RT WITHOUT
4 of 8 series · 10 of 40 positions shown · non-contrast
Comparison: None available.

PAIN SEVERAL MONTHS
FINAL REPORT:
EXAM: MRI KNEE RT WITHOUT
INDICATION: Other internal derangements of right knee
TECHNIQUE: Multiplanar multisequence MRI of the right knee without IV contrast.

[Series 8: PD · sagittal · right · 3.0mm · 0.21mm/px · 1 of 32 slices shown]
[im 1/32]
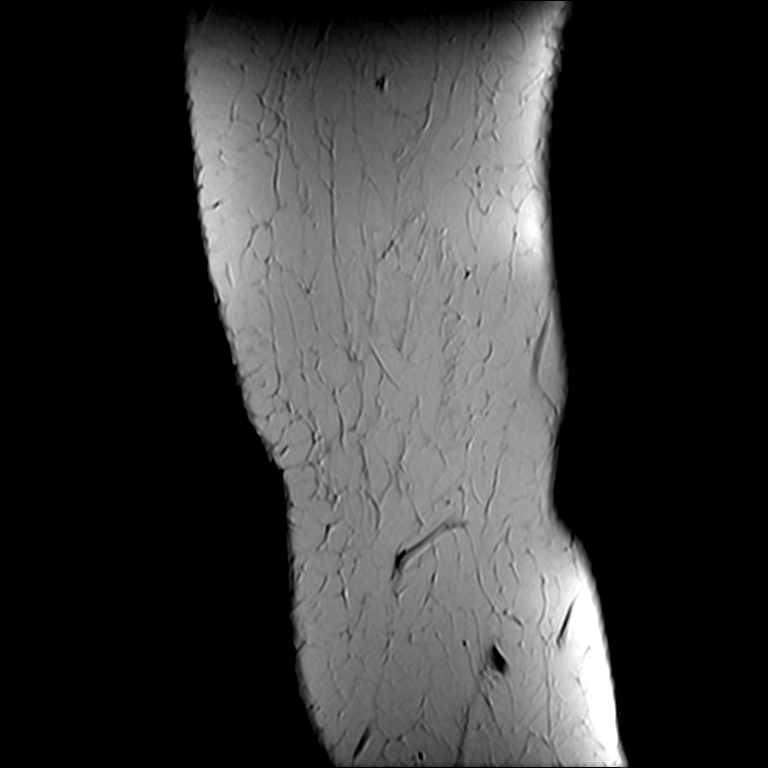

[Series 9: PD fat-sat · sagittal · right · 3.0mm · 0.21mm/px · 3 of 32 slices shown (1 of 3)]
[im 1/32]
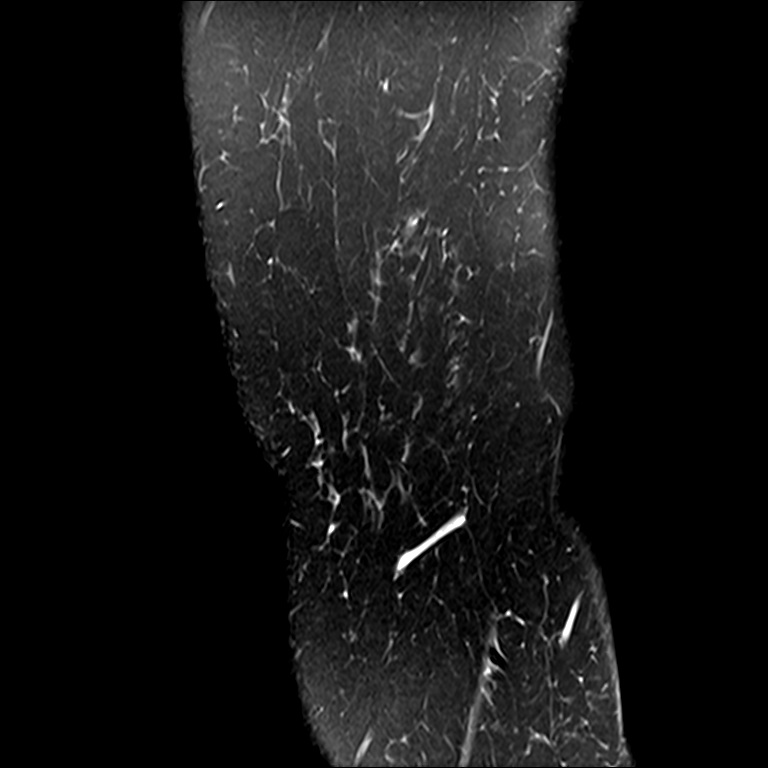
[im 16/32]
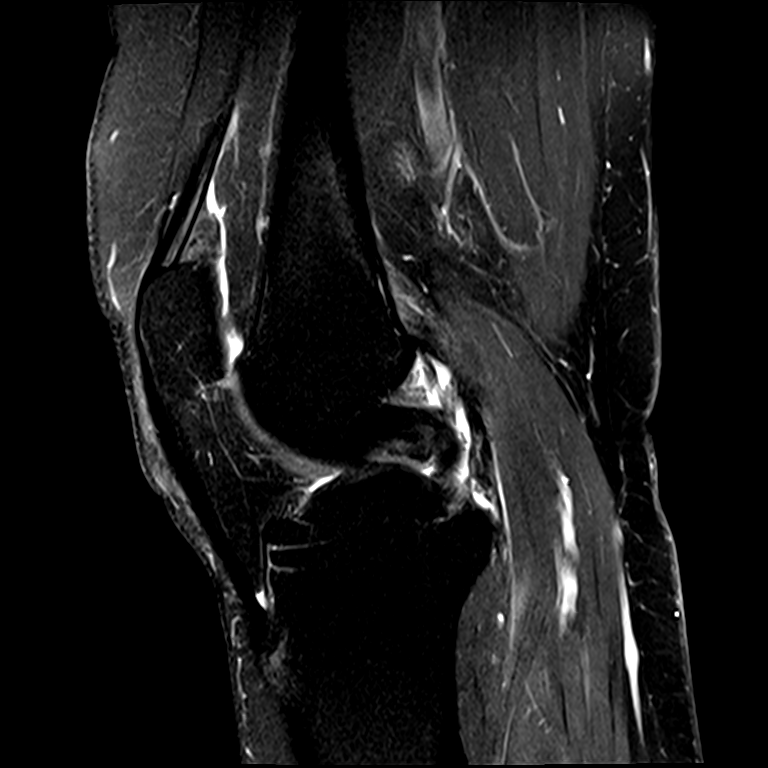
[im 32/32]
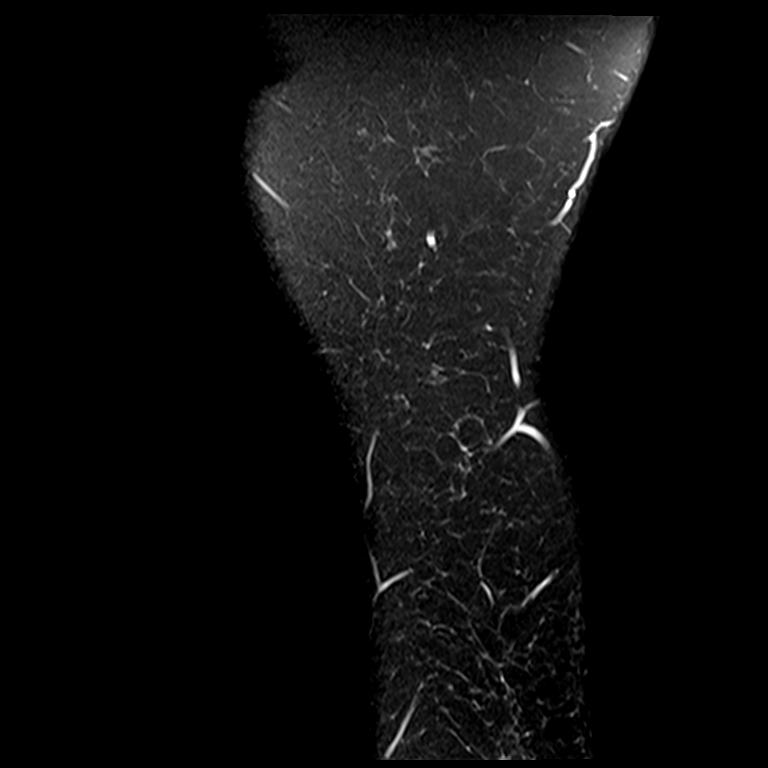

[Series 10: PD fat-sat · coronal · right · 3.0mm · 0.21mm/px · 3 of 32 slices shown (2 of 3)]
[im 1/32]
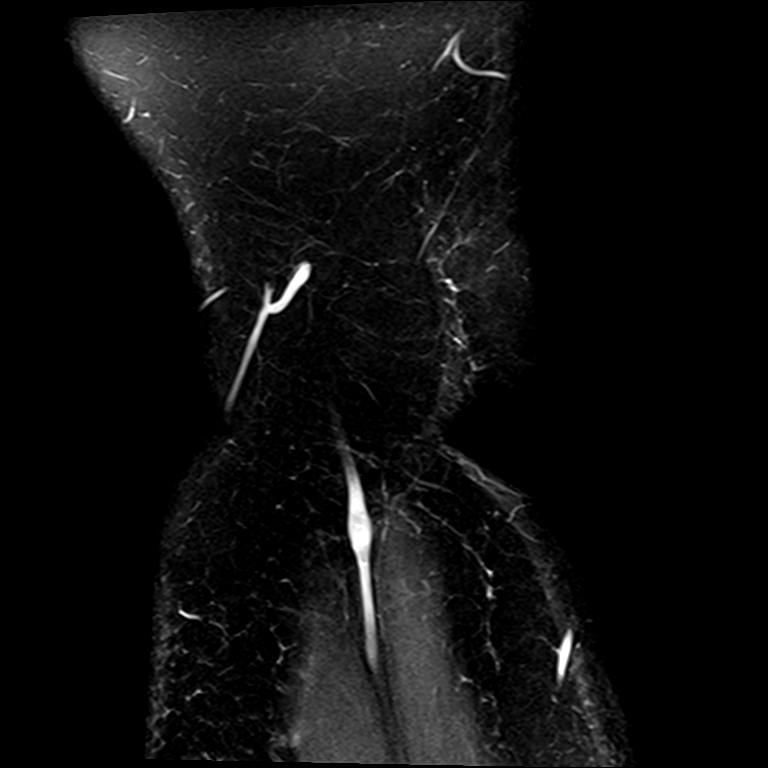
[im 16/32]
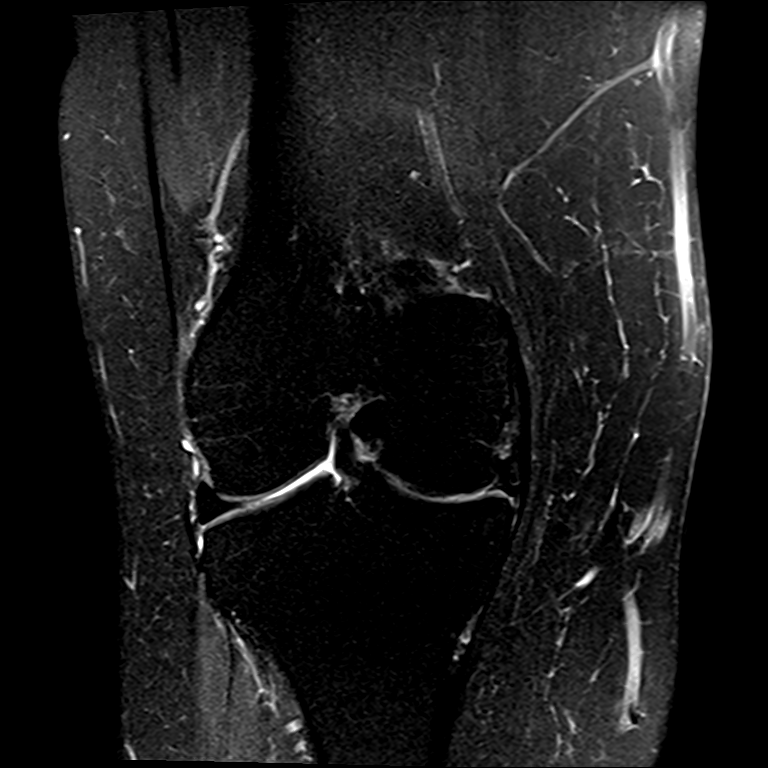
[im 32/32]
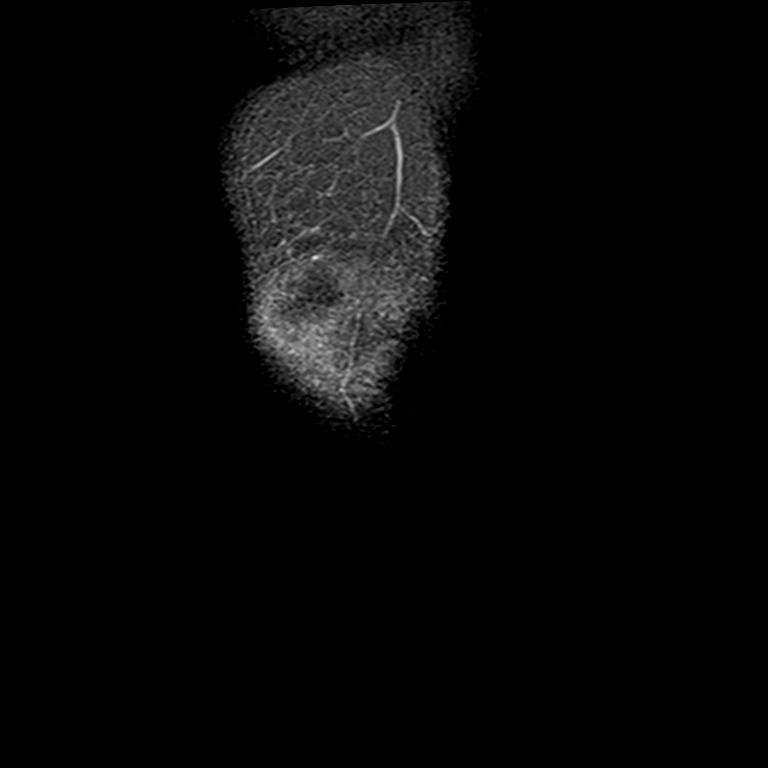

[Series 11: PD fat-sat · axial · right · 3.0mm · 0.25mm/px · z∈[-11,+94]mm · 3 of 40 slices shown (3 of 3)]
[im 8/40]
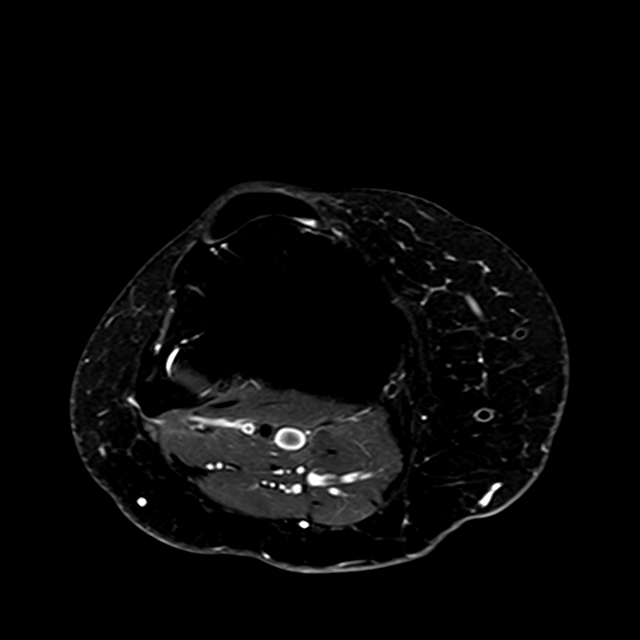
[im 24/40]
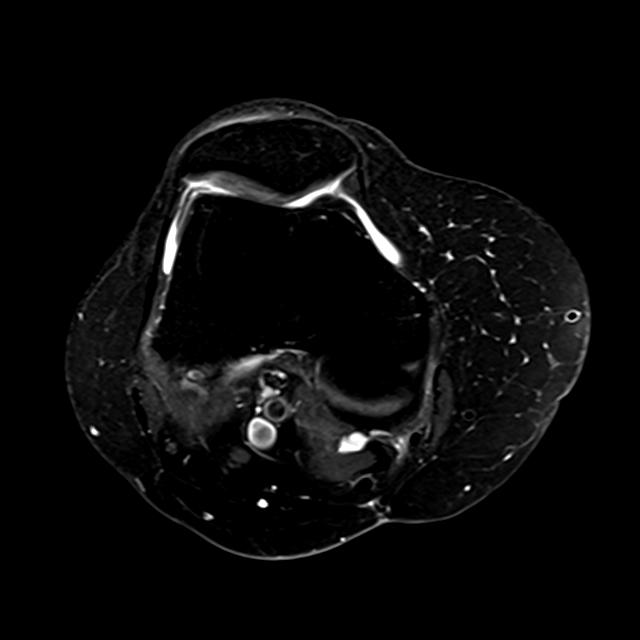
[im 40/40]
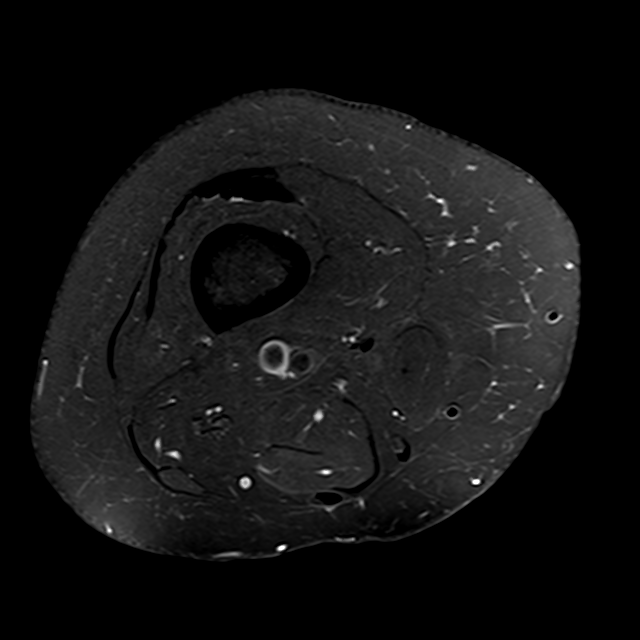

[10 of 40 positions shown; findings below may reference images not displayed]

FINDINGS: SOFT TISSUES/FLUID: Thin, multilobulated ganglion cyst extending superiorly along the posterior aspect of the femur measuring 5.9 x 0.8 cm.
BONES: No acute fracture or bone contusion.
MENISCI
Medial Meniscus: Degenerative fraying of the inner free edge body segment with mild extrusion of meniscal tissue. Mild intrasubstance degeneration.
Lateral Meniscus: Intact.
LIGAMENTS
ACL: Intact.
PCL: Intact.
MCL: Intact.
LCL: Intact.
CARTILAGE:
Patellofemoral Compartment: Mild chondral thinning with partial-thickness chondral loss throughout the medial patellar facet.
Medial Compartment: Diffuse chondral thinning with partial-thickness chondral loss along the mid femoral condyle and tibial plateau.
Lateral Compartment: Diffuse chondral thinning without a focal chondral defect.
EXTENSOR MECHANISM: Quadriceps tendon is intact. Patellar tendon is intact. No evidence of retinacular disruption.
MUSCLES/TENDONS: Unremarkable
NEUROVASCULATURE: Unremarkable
IMPRESSION: 1.  Tricompartmental grade III chondromalacia with degenerative fraying of the inner free edge body segment medial meniscus.
2.  Thin, multilobulated Baker's cyst measuring 5.8 x 0.8 cm, extending superiorly along the posterior femur.
3.  The ACL is intact.
Is the patient pregnant?
No

## 2023-05-31 ENCOUNTER — Ambulatory Visit: Payer: PRIVATE HEALTH INSURANCE | Attending: Vascular Surgery

## 2023-08-10 ENCOUNTER — Inpatient Hospital Stay
Admit: 2023-08-10 | Discharge: 2023-08-11 | Disposition: A | Discharge status: Home / Self Care | Payer: PRIVATE HEALTH INSURANCE | Source: Home / Self Care

## 2023-08-10 ENCOUNTER — Ambulatory Visit: Payer: PRIVATE HEALTH INSURANCE

## 2023-08-10 DIAGNOSIS — R634 Abnormal weight loss: Secondary | ICD-10-CM

## 2023-08-10 DIAGNOSIS — R197 Diarrhea, unspecified: Secondary | ICD-10-CM

## 2023-08-10 LAB — Aspartate Aminotransferase: ASPARTATE AMINOTRANSFERASE: 25 U/L (ref 13–62)

## 2023-08-10 LAB — Bilirubin,Conjugated: BILIRUBIN,CONJUGATED: <0.2 mg/dL (ref <=0.3)

## 2023-08-10 LAB — Extra Light Green Top

## 2023-08-10 LAB — Lipase: LIPASE: 22 U/L (ref 13–69)

## 2023-08-10 LAB — Alkaline Phosphatase: ALKALINE PHOSPHATASE: 82 U/L (ref 37–113)

## 2023-08-10 LAB — Basic Metabolic Panel
CHLORIDE: 107 mmol/L — ABNORMAL HIGH (ref 96–106)
TOTAL CO2: 22 mmol/L (ref 20–30)

## 2023-08-10 LAB — Amylase: AMYLASE: 89 U/L (ref 31–124)

## 2023-08-10 LAB — CBC: PLATELET COUNT, AUTO: 350 10*3/uL (ref 143–398)

## 2023-08-10 LAB — Alanine Aminotransferase: ALANINE AMINOTRANSFERASE: 14 U/L (ref 8–70)

## 2023-08-10 LAB — Albumin: ALBUMIN: 4.3 g/dL (ref 3.9–5.0)

## 2023-08-10 LAB — Differential Automated: EOSINOPHIL PERCENT, AUTO: 3.1 (ref 1.80–6.90)

## 2023-08-10 MED ADMIN — QUETIAPINE FUMARATE 100 MG PO TABS: 100 mg | ORAL | @ 22:00:00 | Stop: 2023-08-10

## 2023-08-10 MED ADMIN — LIDOCAINE 5 % EX PTCH: 3 | TRANSDERMAL | @ 19:00:00 | Stop: 2023-08-12

## 2023-08-10 MED ADMIN — HYDROMORPHONE HCL 1 MG/ML IJ SOLN: 1 mg | INTRAVENOUS | @ 22:00:00 | Stop: 2023-08-10 | NDC 00409128331

## 2023-08-10 MED ADMIN — DULOXETINE HCL 20 MG PO CPEP: 60 mg | ORAL | @ 19:00:00 | Stop: 2023-08-12 | NDC 60687072311

## 2023-08-10 MED ADMIN — ONDANSETRON HCL 4 MG/2ML IJ SOLN: 4 mg | INTRAVENOUS | @ 14:00:00 | Stop: 2023-08-10 | NDC 60505613000

## 2023-08-10 MED ADMIN — HYDROMORPHONE HCL 1 MG/ML IJ SOLN: 1 mg | INTRAVENOUS | @ 15:00:00 | Stop: 2023-08-10 | NDC 00409128331

## 2023-08-10 MED ADMIN — SODIUM CHLORIDE 0.9 % IV BOLUS: 1000 mL | INTRAVENOUS | @ 14:00:00 | Stop: 2023-08-10 | NDC 00338004904

## 2023-08-10 MED ADMIN — CLOPIDOGREL BISULFATE 75 MG PO TABS: 75 mg | ORAL | @ 19:00:00 | Stop: 2023-08-12 | NDC 68084053611

## 2023-08-10 MED ADMIN — PREGABALIN 25 MG PO CAPS: 25 mg | ORAL | @ 19:00:00 | Stop: 2023-08-12 | NDC 60687047311

## 2023-08-10 MED ADMIN — PANTOPRAZOLE SODIUM 40 MG PO TBEC: 40 mg | ORAL | @ 19:00:00 | Stop: 2023-08-12 | NDC 60687073609

## 2023-08-10 MED ADMIN — IOHEXOL 350 MG/ML IV SOLN: 100 mL | INTRAVENOUS | @ 15:00:00 | Stop: 2023-08-10 | NDC 00407141491

## 2023-08-10 MED ADMIN — QUETIAPINE FUMARATE 100 MG PO TABS: 100 mg | ORAL | @ 22:00:00 | Stop: 2023-08-12 | NDC 60687034911

## 2023-08-10 MED ADMIN — CHOLESTYRAMINE 4 G PO PACK: 4 g | ORAL | @ 21:00:00 | Stop: 2023-08-12

## 2023-08-10 MED ADMIN — OXYCODONE HCL 5 MG PO TABS: 10 mg | ORAL | @ 20:00:00 | Stop: 2023-08-12 | NDC 68084035411

## 2023-08-10 MED ADMIN — HYDROMORPHONE HCL 1 MG/ML IJ SOLN: .2 mg | INTRAVENOUS | @ 19:00:00 | Stop: 2023-08-10 | NDC 00409128331

## 2023-08-10 MED ADMIN — SODIUM CHLORIDE 0.9 % IV SOLN: 75 mL/h | INTRAVENOUS | @ 19:00:00 | Stop: 2023-08-12 | NDC 00338004904

## 2023-08-10 MED ADMIN — PANCRELIPASE (LIP-PROT-AMYL) 36000-114000 UNITS PO CPEP: 36000 [IU] | ORAL | @ 20:00:00 | Stop: 2023-08-12 | NDC 00032301613

## 2023-08-10 MED ADMIN — HYDROMORPHONE HCL 1 MG/ML IJ SOLN: 1 mg | INTRAVENOUS | @ 14:00:00 | Stop: 2023-08-10 | NDC 00409128331

## 2023-08-10 NOTE — ED Notes
[facility] NOTIFICATION 08/10/2023 06:38 Donna Gross, Donna Gross DOB: 1966-04-25 MRN: [id number]    [name]:   IEP:?[id number]  Account [id number]  Billing Account [id number]      Criteria Met      6 Visits in 180 Days    History of Sepsis Dx    Security and Safety  No Security Events were found.  ED Care Guidelines  There are currently no ED Care Guidelines for this patient. Please check your facility's medical records system.    Flags      History of Sepsis - Patient has received a diagnosis of Sepsis from an acute or post-acute setting. Apply appropriate clinical planning practices; to learn more visit [email or web address] / Attributed By: [employer] / Attributed On: 05/20/2023       Prescription Drug Data  No Prescription Drug Data was found.    E.D. Visit Count (12 mo.)  Facility Visits   [name] 2   [name] 1   [facility] 1   Total 4   Note: Visits indicate total known visits.     Recent Emergency Department Visit Summary  Date Facility [facility] Type Diagnoses or Chief Complaint    Aug 10, 2023  [facility]  [place]     May 22, 2023  [name]  [place]  Chief Complaint: CP/ABD PAIN    May 21, 2023  [facility]  [name]  [place]  [place]gency      Dysphagia, unspecified      Chronic obstructive pulmonary disease, unspecified      Gastritis, unspecified, without bleeding      Heart failure, unspecified      Other long term (current) drug therapy      Major depressive disorder, single episode, unspecified      Acquired absence of other specified parts of digestive tract      Anxiety disorder, unspecified      Weakness      Anemia, unspecified      May 19, 2023  [facility]  [place]  [place]  [place]  Chief Complaint: SOB      Recent Inpatient Visit Summary  Date Facility [facility] Type Diagnoses or Chief Complaint    May 22, 2023  [name]  [place]  Medical Surgical  Chief Complaint: Colitis    May 19, 2023  [facility]  [name]  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  [place]  Cath Lab      1. Generalized abdominal pain      1. Noninfective gastroenteritis and colitis, unspecified      2. Other specified postprocedural states      3. Vascular disorder of intestine, unspecified      4. Tobacco  use      5. Diarrhea, unspecified      6. Nausea with vomiting, unspecified      8. Chronic vascular disorders of intestine      9. Unspecified severe protein-calorie malnutrition      10. Anemia, unspecified      Apr 23, 2023  [facility].  [place]  Family Practice      1. Noninfective gastroenteritis and colitis, unspecified      1. Generalized abdominal pain      2. Other specified postprocedural states      3. Vascular disorder of intestine, unspecified      4. Tobacco use      5. Diarrhea, unspecified      6. Nausea with vomiting, unspecified      8. Chronic vascular disorders of intestine      9. Unspecified severe protein-calorie malnutrition      10. Anemia, unspecified      Feb 02, 2023  [facility].  [place]  CT Scan      1. Dehydration      2. Generalized abdominal pain      3. Abnormal weight loss      4. Unspecified abdominal pain      5. Acute kidney failure, unspecified      6. Diarrhea, unspecified      7. Nausea with vomiting, unspecified        Care Team  [facility] Phone Fax Service Dates   PEREZ, [name], M.D. Family Medicine   Current      [facility]  This patient has registered at the [facility] Emergency Department  For more information visit: [email or web address]   PLEASE NOTE:     1.   Any care recommendations and other clinical information are provided as guidelines or for historical purposes only, and providers should exercise their own clinical judgment when providing care.    2.   You may only use this information for purposes of treatment, payment or health care operations activities, and subject to the limitations of applicable [facility] Policies.    3.   You should consult directly with the organization that provided a care guideline or other clinical history with any questions about additional information or accuracy or completeness of information provided.    ? 2024 [facility] - [email or web address]

## 2023-08-10 NOTE — ED Provider Notes
[facility]  Emergency Department Service Report    Donna Gross 57 y.o. female , presents with Abdominal Pain    Triage   Arrived on 08/10/2023 at 6:38 AM   Arrived by Walk-in [14]    ED Triage Vitals   Temp Temp Source BP Heart Rate Resp SpO2 O2 Device Pain Score Weight   08/10/23 0641 08/10/23 0641 08/10/23 0641 08/10/23 0641 08/10/23 0641 08/10/23 0641 08/10/23 0641 08/10/23 0645 08/10/23 0646   36.7 ?C (98.1 ?F) Oral 120/73 84 16 98 % None (Room air) Ten 47.9 kg (105 lb 9.6 oz)       Pre hospital care:       Allergies   Allergen Reactions    Hydrocodone Other (See Comments)     ''burns a hole in stomach''  Tolerates hydromorphone    Ibuprofen Other (See Comments)     GI discomfort    ''hurts my stomach''    Morphine Itching     Tolerates hydromorphone       History   HPI     Donna Gross is a 57 y.o. female with a history of intestinal stoma prolapse, duodenal ulcer, peptic ulcer disease, odynophagia, arterial hypotension, gallstones, colitis, superior mesenteric artery stenosis, anxiety, depression, emphysema, fibromyalgia, gastritis, GERD, pancreatitis, S/P blood transfusion, S/P SMA stenting, and S/P multiple abdominal surgeries, presenting with abdominal pain, onset 1 month ago. Patient describes abdominal pain radiating to her back, with associated nausea, vomiting, and diarrhea. Patient states that her abdominal pain is usually treated in the ED with IV Dilaudid, with some relief of abdominal pain. Patient reports being seen at [facility] ED in 03/2023 for abdominal pain and recommended to undergo a stent procedure. Patient states that she underwent the stent procedure, lost a lot of blood requiring a transfusion, and abdominal pain never subsided. Patient states that she has been unable to eat secondary to abdominal pain and has been unintentionally losing weight. Patient reports last eating a minimal amount of chicken, one slice of bread, and Maxwell juice yesterday.    I personally reviewed the patient's medical records. Patient with a history of chronic intractable abdominal pain and has been admitted multiple times. Patient has been experiencing intractable vomiting and weight loss. Patient is S/P SMA stenting and S/P multiple abdominal surgeries.                          Past Medical History:   Diagnosis Date    Anxiety     Depression     Emphysema, unspecified (HCC/RAF)     Emphysema, unspecified (HCC/RAF)     Fibromyalgia     Gastritis     GERD (gastroesophageal reflux disease)     History of blood transfusion     Intractable nausea and vomiting 01/23/2020    Pancreatitis         Past Surgical History:   Procedure Laterality Date    ABDOMINAL SURGERY      COLON SURGERY          Past Family History   family history includes Diabetes in her brother and father; Heart disease in her mother.                 Past Social History   she reports that she has been smoking cigarettes. She uses smokeless tobacco. She reports that she does not drink alcohol and does not use drugs. No history on file for sexual activity.  Physical Exam   Physical Exam  Vitals and nursing note reviewed.   Constitutional:       Appearance: She is well-developed.      Comments: (+) Moderate distress due to pain. Very thin appearing.    HENT:      Head: Normocephalic and atraumatic.      Mouth/Throat:      Mouth: Mucous membranes are dry.   Eyes:      Conjunctiva/sclera: Conjunctivae normal.   Cardiovascular:      Rate and Rhythm: Normal rate and regular rhythm.   Pulmonary:      Effort: Pulmonary effort is normal. No respiratory distress.      Breath sounds: Normal breath sounds.   Abdominal:      Palpations: Abdomen is soft.      Tenderness: There is no guarding or rebound.      Comments: (+) Abdomen with evidence of multiple prior surgical procedures. Mildly distended. Diffusely tender. Hypoactive bowel sounds.    Musculoskeletal:         General: Normal range of motion.      Cervical back: Normal range of motion and neck supple.   Lymphadenopathy:      Cervical: No cervical adenopathy.   Skin:     General: Skin is warm and dry.   Neurological:      Mental Status: She is alert.      Sensory: No sensory deficit.      Comments: Moving all four extremities.   Psychiatric:         Behavior: Behavior normal.       ED Course     ED Course as of 08/10/23 1726   Tue Aug 10, 2023   1048 Discussed with Hospitalist, they will admit the patient. [MS]      ED Course User Index  [MS] Lurena Nida       Laboratory Results     Labs Reviewed   BASIC METABOLIC PANEL - Abnormal; Notable for the following components:       Result Value    Chloride 107 (*)     All other components within normal limits   CBC (PERFORMABLE) - Abnormal; Notable for the following components:    Red Blood Cell Count 3.65 (*)     Hemoglobin 10.5 (*)     Hematocrit 33.3 (*)     Red Cell Distribution Width-SD 57.1 (*)     Red Cell Distribution Width-CV 17.0 (*)     Mean Platelet Volume 8.1 (*)     All other components within normal limits   ALT (SGPT) - Normal   AST (SGOT) - Normal   BILIRUBIN,CONJ - Normal   ALKALINE PHOSPHATASE - Normal   ALBUMIN - Normal   LIPASE - Normal   AMYLASE - Normal   RAINBOW DRAW TO LABORATORY    Narrative:     The following orders were created for panel order [facility] Draw to Laboratory Clinton Gallant).  Procedure                               Abnormality         Status                     ---------                               -----------         ------  Extra Burna Mortimer ZOX[[id number]]                            Final result                 Please view results for these tests on the individual orders.   CBC & AUTO DIFFERENTIAL    Narrative:     The following orders were created for panel order CBC & Plt & Diff.  Procedure                               Abnormality         Status                     ---------                               -----------         ------                     WJX[[id number]]                          Abnormal Final result               Differential, Automated[[id number]]                          Final result                 Please view results for these tests on the individual orders.   EXTRA LIGHT GREEN TOP   DIFFERENTIAL, AUTOMATED (PERFORMABLE)       Imaging Results     CT abd+pelvis angiogram wo+w contrast   Final Result by Dalbert Batman., MD (08/20 0910)   IMPRESSION:      No evidence of acute abdominopelvic pathology.   Atherosclerosis with patent mesenteric vasculature. Patent superior mesenteric artery stent.         Signed by: Quentin Cornwall   08/10/2023 9:10 AM          Administered Medications     Medication Administration from 08/10/2023 2130 to 08/10/2023 1111         Date/Time Order Dose Route Action Action by Comments     08/10/2023 0841 PDT sodium chloride 0.9% IV soln bolus 1,000 mL 0 mL Intravenous Stopped Schmit, Alyse Low, RN Infusion completed     08/10/2023 8657 PDT sodium chloride 0.9% IV soln bolus 1,000 mL 1,000 mL Intravenous New Bag/ Syringe/ Darl Pikes, RN --     08/10/2023 [phone] PDT HYDROmorphone 1 mg/mL inj 1 mg 1 mg IV Push Given Pearson Forster, RN --     08/10/2023 [phone] PDT ondansetron 4 mg/2 mL inj 4 mg 4 mg Intravenous Given Schmit, Alyse Low, RN --     08/10/2023 0827 PDT HYDROmorphone 1 mg/mL inj 1 mg 1 mg IV Push Given Schmit, Alyse Low, RN --     08/10/2023 [phone] PDT iohexol (Omnipaque) 350 mg/mL inj 100 mL 100 mL Intravenous Given Chaney Malling Monique 3.5cc/sec            HYDROmorphone 1 mg/mL inj 0.2 mg, HYDROmorphone 1 mg/mL inj 1 mg, HYDROmorphone 1 mg/mL inj 1 mg, HYDROmorphone  1 mg/mL inj 1 mg is a High-risk medication as it is a parenteral controlled substance.    iohexol (Omnipaque) 350 mg/mL inj 100 mL is a High-risk medication administered in this ED visit due to the patient's comorbid kidney disease/injury and/or known allergy/intolerance that require close physiologic and lab monitoring for toxicity.     Procedures   Procedural Sedation  Procedures    Medical Decision Making   Donna Gross is a 57 y.o. female with a history of intestinal stoma prolapse, duodenal ulcer, peptic ulcer disease, odynophagia, arterial hypotension, gallstones, colitis, superior mesenteric artery stenosis, anxiety, depression, emphysema, fibromyalgia, gastritis, GERD, pancreatitis, S/P blood transfusion, S/P SMA stenting, and S/P multiple abdominal surgeries, presenting with abdominal pain, onset 1 month ago.     Nursing note reviewed.  Previous medical records were obtained and reviewed by myself.     I visited the patient to obtain history and perform the physical exam. Orders placed for labs and imaging. Patient will be administered IV fluids, Zofran, and Dilaudid. Will discuss with Hospitalist.     Laboratory Results (as interpreted by Dairl Ponder MD):    BMP, albumin, amylase, AST/ALT, alkaline phosphatase, bilirubin, lipase, and CBC    Radiology Results (as interpreted by Dairl Ponder MD):     CT abdomen/pelvis is negative and SMA stent was patented.     Given the patient's physical exam, laboratory analysis, and imaging studies, my impression is the patient is experiencing an episode of intractable abdominal pain, weight loss, and abnormal diarrhea. Given the above findings and discussion, I strongly believe the patient will greatly benefit from an inpatient admission for further evaluation and treatment.     Medical Decision Making     Problems  Clinical Impressions Complexity of problems addressed         Intractable abdominal pain (Primary)  Weight loss, abnormal  Diarrhea, unspecified type High:  [x]  Acute/chronic illness/injury with threat to life to bodily function  []  Chronic illness with severe exacerbation, progression, or side effects of treatment    Moderate:  []  Undiagnosed new problem with uncertain prognosis  []  Acute illness with systemic symptoms  []  Acute complicated injury    Data and Risk  Independent Historian []  Parent as child too young to provide hx []  Family/Caregiver due to AMS/dementia []  EMS due to medical acuity/trauma []  Family/EMS due to behavioral health concern and for collateral []    External Data Reviewed previous workup and mgmt of patient's Abdominal Pain via  []  Previous [place] Notes/Labs/Imaging []  External Notes/Labs/Imaging  which were non-contributory unless documented otherwise in HPI and ED Course   Considered but decided against  []  CT Head/C-spine given [place] CT/NEXUS/PECARN criteria []  CTA Chest to r/o PE given PERC/Well's criteria []  CT AP to r/o appendicitis given PAS score or family discussion []  Hospitalization due to *  []    Discussed w/ ext HCP []  Consults, PCP/outpt specialists, nursing home. See ED Course for details.   SDOH Affecting Dx/Tx []  Insurance limiting specialist referral []  Housing instability limiting outpt mgmt   []  Financial insecurity limiting medication access []  Substance/ETOH use []    Care de-escalation []  Shared decision-making regarding de-escalation of care (e.x. DNR) or foregoing hospitalization-level of care due to patient wishes/goals of care   Interpretations See ED Course. [x]     If applicable, parenteral controlled substances, drug therapies requiring intensive monitoring for toxicity, and prescription drug management are documented in the the Medications section of this note. If applicable, major and minor procedures are documented  separately in Procedure Notes.    Clinical Impression           Intractable abdominal pain (Primary)  Weight loss, abnormal  Diarrhea, unspecified type      Prescriptions     New Prescriptions    No medications on file       Disposition and Follow-up   Disposition:  Observation [10]     No future appointments.    Follow up with:  No follow-up provider specified.    Return precautions are specified on After Visit Summary.        Scribe Signature   I, Lurena Nida, have acted as a Stage manager for patient Donna Gross on behalf of Dr. Bernadene Bell at 08/10/2023 at 7:01 AM. All documentation underwent a comprehensive review by the listed physician(s) and received their approval upon signing.               Lelon Mast., MD  08/10/23 [phone]

## 2023-08-10 NOTE — ED Notes
Patient verbalized understanding of plan of care and provided a menu, patient content to eat previously provided sandwich and will let RN know when she is ready to order lunch. Patient resting in bed at this time on 2L NC, fall precautions in place and call light in reach.

## 2023-08-10 NOTE — ED Notes
Report given to [employer]. Patient verbalized understanding of plan of care and ordered meal tray at this time.

## 2023-08-10 NOTE — H&P
HOSPITALIST HISTORY AND PHYSICAL    ADMISSION DATE: 08/10/23    ATTENDING PHYSICIAN: Simonne Come*  PMD: Kelle Darting, MD    CC: Abdominal Pain (C/o abdominal pain x longer than 1 month with associated n/v/d. Reports hx superior mesenteric artery stenting.)    HPI:  Donna Gross is a 57 y.o. 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   Patient states that the pain is 10/10, generalized, and radiates to the back. She also reports severe nausea, intermittent non-bloody emesis, and ongoing diarrhea. She says that the pain has never truly subsided since her surgery.     Of note, recently admitted for a similar presentation, At that time GI was consulted - and recommended outpatient colonoscopy and continued symptom management with       ?  ED COURSE:  ED Triage Vitals   Temp Temp Source BP Heart Rate Resp SpO2 O2 Device Pain Score Weight   08/10/23 0641 08/10/23 0641 08/10/23 0641 08/10/23 0641 08/10/23 0641 08/10/23 0641 08/10/23 0641 08/10/23 0645 08/10/23 0646   36.7 ?C (98.1 ?F) Oral 120/73 84 16 98 % None (Room air) Ten 47.9 kg (105 lb 9.6 oz)       MEDICATIONS:    acetaminophen 500 mg tablet Take 2 tablets (1,000 mg total) by mouth every six (6) hours as needed for Pain.  Qty: 60 tablet, Refills: 0       lidocaine 5% patch Place 3 patches onto the skin daily 12 hours on 12 hours off.  Qty: 30 patch, Refills: 0       loperamide 2 mg capsule Take 1 capsule (2 mg total) by mouth four (4) times daily as needed for Diarrhea.  Qty: 30 capsule, Refills: 0       Menthol-Methyl Salicylate (ANALGESIC BALM) 10-15 % cream Apply topically four (4) times daily.  Qty: 60 g, Refills: 0       oxyCODONE 10 mg tablet Take 1 tablet (10 mg total) by mouth every six (6) hours as needed. Max Daily Amount: 40 mg  Qty: 40 tablet, Refills: 0       pregabalin 25 mg capsule Take 1 capsule (25 mg total) by mouth two (2) times daily. Max Daily Amount: 50 mg  Qty: 60 capsule, Refills: 0                       Details   hyoscyamine 0.125 mg tablet Take 1 tablet (125 mcg total) by mouth every four (4) hours as needed for Cramping.  Qty: 30 tablet, Refills: 0       naloxone 4 mg/0.1 mL nasal spray Call 911. Administer a single spray intranasally into one nostril for opioid overdose. May repeat in 3 minutes if patient is not breathing.Rob Bunting: 2 each, Refills: 1       ondansetron 4 mg tablet Take 1 tablet (4 mg total) by mouth every six (6) hours as needed for Nausea or Vomiting.  Qty: 30 tablet, Refills: 0       pancrelipase, Lip-Prot-Amyl, (CREON) 36000 units DR capsule Take 1 capsule (36,000 units of lipase total) by mouth three (3) times daily with meals.  Qty: 90 capsule, Refills: 0       pantoprazole 40 mg DR tablet Take 1  tablet (40 mg total) by mouth daily.  Qty: 30 tablet, Refills: 0       Polyethylene Glycol 3350 (PEG 3350) 17 GM/SCOOP POWD Mix 1 capful (17 g) in 6 to 8 oz of liquid and drink by mouth two (2) times daily.  Qty: 510 g, Refills: 0       senna 8.6 mg tablet Take 1 tablet by mouth at bedtime as needed for Constipation.  Qty: 60 tablet, Refills: 0                       Details   albuterol 90 mcg/act inhaler Inhale 2 puffs every six (6) hours as needed for Wheezing or Shortness of Breath.       aspirin 81 mg chewable tablet Chew 1 tablet (81 mg total) by mouth daily.  Qty: 90 tablet, Refills: 0       atorvastatin 40 mg tablet Take 1 tablet (40 mg total) by mouth at bedtime.       B Complex Vitamins (VITAMIN B COMPLEX) capsule Take 1 capsule by mouth daily.       calcium carbonate 500 mg chewable tablet Chew 1 tablet (500 mg total) by mouth two (2) times daily as needed Calcium Carbonate 500 mg is equivalent to 200 mg elemental Calcium.  Qty: 30 tablet, Refills: 0       Cholecalciferol (VITAMIN D3) 50 mcg (2000 units) TABS Take 1 tablet (50 mcg total) by mouth daily.       cholestyramine 4 g/dose powder Take 1 packet (4 g total) by mouth three (3) times daily with meals.  Qty: 378 g, Refills: 3       clobetasol 0.05% cream Apply topically three (3) times daily as needed (rash).       clopidogrel 75 mg tablet Take 1 tablet (75 mg total) by mouth daily.  Qty: 90 tablet, Refills: 0       cyanocobalamin 1000 mcg tablet Take 1 tablet (1,000 mcg total) by mouth daily.       DULoxetine 60 mg DR capsule Take 1 capsule (60 mg total) by mouth daily.  Qty: 30 capsule, Refills: 0       fluticasone-salmeterol 250-50 mcg/act diskus inhaler Inhale 1 puff two (2) times daily.       ipratropium-albuterol 20-100 mcg/act inhaler Inhale 2 puffs four (4) times daily as needed for Wheezing or Shortness of Breath.       QUEtiapine 100 mg tablet Take 1 tablet (100mg  with the other strength for a  total of 150 mg) by mouth two (2) times daily.  Qty: 30 tablet, Refills: 0       ranolazine 500 mg 12 hr tablet Take 1 tablet (500 mg total) by mouth two (2) times daily.       sodium sulfate-potassium sulfate-magnesium sulfate solution At 6pm the evening before colonoscopy, drink 16oz x1 dose, then drink 32oz water over 1 hour. At 5am the day of colonoscopy, repeat above.  Qty: 354 mL, Refills: 0                 VITALS:  Temp:  [36.4 ?C (97.5 ?F)-36.7 ?C (98.1 ?F)] 36.4 ?C (97.5 ?F)  Heart Rate:  [61-84] 61  Resp:  [16-20] 20  BP: (106-135)/(61-91) 135/79  NBP Mean:  [88-96] 96  SpO2:  [89 %-98 %] 98 %     Weight: 47.9 kg (105 lb 9.6 oz)  Oxygen Therapy  SpO2: 98 %  O2 Device:  Nasal cannula  Flow Rate (L/min): 2 L/min  No intake/output data recorded.  ?  PHYSICAL EXAM:  General: alert, well-appearing, in moderate distress due to abdominal pain  Heart: RRR, no m/r/g  Lungs: breathing comfortably, speaking full sentences, no accessory muscle use  Abdomen: tender to palpation diffusely, multiple scar due to past surgeries   MSK: no LE edema    LABS:  I have reviewed the clinically relevant labs.  CBC  Recent Labs     08/10/23  0704   WBC 7.07   HGB 10.5*   HCT 33.3*   MCV 91.2   PLT 350     BMP  Recent Labs     08/10/23  0704   NA 140   K 4.4   CL 107*   CO2 22   BUN 19   CREAT 1.10   CALCIUM 9.7     LFT  Recent Labs     08/10/23  0704   ALBUMIN 4.3   BILICON <0.2   ALT 14   AST 25   ALKPHOS 82   AMYLASE 89   LIPASE 22       ?  IMAGING:  I have reviewed the clinically relevant imaging.  CT abd+pelvis angiogram wo+w contrast    Result Date: 08/10/2023  IMPRESSION: No evidence of acute abdominopelvic pathology. Atherosclerosis with patent mesenteric vasculature. Patent superior mesenteric artery stent. Signed by: Quentin Cornwall   08/10/2023 9:10 AM   ?  STUDIES:  I have reviewed the clinically relevant studies.      ASSESSMENT/PLAN:  Runelle Chop is a 57 y.o. female with h/o presenting with:     #acute on chronic abdominal pain, POA  #SMA stenosis, chronic, POA  #celiac stenosis, POA  #CT evidence of CAD, POA  #s/p angio and stenting of SMA of 04/29/23  #multiple abdominal surgeries c/b adhesions, chronic, POA  #GERD, chronic, POA  Pt with long-standing history of abdominal pain for about 1 year. Has been admitted several times previously with similar complaints. CTAP without evidence of acute abdominopelvic pathology. Low concern for bowel ischemia given lactate negative and SMA stent patent. Query component of dysmotility due to opioid use, though opioid requirement began after onset of chronic symptoms. Overall most likely multifactorial from history of extensive abdominal surgeries, GERD, fibromyalgia, opioid dependence, pancreatic insufficiency, etc.  - Vascular surgery consulted, appreciate recs  - s/p fluid resuscitation, discontinued fluids  - Regular diet  - pain regimen:        - tylenol 1 g q8hr ATC       - pregabalin 25 mg BID       - home duloxetine 60 mg qday - topicals: lidocaine patch x3       - oxycodone SS 5//10 q4h PRN       - hydromorphone 0.2 mg every 6 hours for BTP for 24 HOURS ONLY             - pain management consult, appreciate recs  - continue home pantoprazole 40 mg daily  - continue atorvastatin 40 mg at bedtime  - s/p SMA stenting: continue home aspirin 81 mg daily and plavix 75 mg daily        #diarrhea, chronic, intermittent, POA  #constipation, chronic, intermittent, POA  #nausea/vomiting, chronic, POA  #hx of pancreatic insufficiency, chronic, POA  Pt with chronic n/v/d for the past year. Has had negative work-up including c. Diff, celiac panel, H.pylori testing, parasitic enteric panel. Pt was unable to tolerate colonoscopy  and had negative EGD.   - bowel regimen: miralax BID, senna PRN, bisacodyl suppository PRN  - continue home hyoscyamine 125 mcg QID   - continue home creon 36,000 units of lipase TID  - nausea: zofran 4 mg q8hrs PRN     #Poor PO intake, chronic, POA  #acute weight loss, POA  #severe protein malnutrition, POA   #severe muscle loss with cachexia due to severe malnutrition, POA  Previously evaluated by nutrition who had recommended boost TID for assistance with malnutrition likely 2/2 poor PO intake from abdominal pain. Reports weight loss of approximately 50 lbs in the last year.  - nutrition consult  - continue vitamin D supplementation     #normocytic anemia, chronic, POA  Most likely chronic disease (pancreatic insufficiency, multiple abdominal surgeries - probably ongoing, chronic inflammation to some degree), however, due to persistent poor PO intake/N/V nutritional deficiencies need to be evaluated as well.   - trend CBC daily  - B12, folate levels, iron studies F/U (normal in May)   - send copper level     #emphysema, POA  #tobacco use, POA  - fluticasone-salmeterol inhaler 1 puff BID, albuterol PRN     #anxiety, chronic, POA   #major depression, chronic, POA  #fibromyalgia, chronic, POA  #insomnia, chronic, POA   - continue home seroquel 150 mg BID   - continue home duloxetine 60 mg daily     VTE Prophylaxis: ambulation  Central Lines: none. Tubes/Drains: none. Airways: none  Diet: Diet regular  Activity: out of bed TID with assist, inspiratory spirometer 10 times per hours while awake, and as tolerated  Precautions: No infectious isolation, standard, seizure, aspiration, and fall precautions.  ?  CODE STATUS:  Full Code, Primary Emergency Contact: [name],[name]  ?  AUTHORRanda Spike, MD  08/10/2023 at 3:15 PM      Billing information:     80 minutes were spent personally by me today on this encounter which include today's pre-visit review of the chart, obtaining appropriate history, performing an evaluation, documentation and discussion of management with details supported within the note for today's visit. The time documented was exclusive of any time spent on the separately billed procedure.        Randa Spike  [facility] Internal Medicine Hospitalist  [phone]  08/10/2023 3:15 PM

## 2023-08-10 NOTE — ED Notes
Introduced self to pt./family, needs discussed, privacy acknowledged, side rails up x 2, call light within reach, armband checked for accuracy. Patient reports that ever since her gallbladder was removed in 2016 she has been having complications with abdominal pain, N/V. Reports that she had a superior mesenteric artery stenting a month ago and has been having diffuse abdominal pain since with N/V and/or diarrhea every time she eats something.

## 2023-08-11 LAB — Iron & Iron Binding Capacity: IRON: 35 ug/dL — ABNORMAL LOW (ref 41–179)

## 2023-08-11 LAB — CBC: NUCLEATED RBC%, AUTOMATED: 0 (ref 31.5–35.5)

## 2023-08-11 LAB — Differential Automated: IMMATURE GRANULOCYTES%: 0.4 (ref 0.20–0.80)

## 2023-08-11 LAB — Phosphorus: PHOSPHORUS: 3 mg/dL (ref 2.3–4.4)

## 2023-08-11 LAB — Magnesium: MAGNESIUM: 1.3 meq/L — ABNORMAL LOW (ref 1.4–1.9)

## 2023-08-11 LAB — Basic Metabolic Panel
POTASSIUM: 4.2 mmol/L (ref 3.6–5.3)
UREA NITROGEN: 10 mg/dL (ref 7–22)

## 2023-08-11 LAB — Folate,Serum: FOLATE,SERUM: 17.5 ng/mL (ref 8.1–30.4)

## 2023-08-11 LAB — Hepatic Funct Panel: BILIRUBIN,CONJUGATED: <0.2 mg/dL (ref 0.1–<=0.3)

## 2023-08-11 LAB — Ferritin: FERRITIN: 15 ng/mL (ref 8–180)

## 2023-08-11 LAB — Vitamin B12: VITAMIN B12: 268 pg/mL (ref 254–1060)

## 2023-08-11 MED ORDER — PEG 3350 17 GM/SCOOP PO POWD
17 g | Freq: Two times a day (BID) | ORAL | 0 refills | 15 days | Status: AC
Start: 2023-08-11 — End: ?

## 2023-08-11 MED ORDER — CHOLESTYRAMINE 4 GM/DOSE PO POWD
4 g | Freq: Three times a day (TID) | ORAL | 0 refills | 32.00000 days | Status: AC
Start: 2023-08-11 — End: ?
  Filled 2023-08-13: qty 378, 32d supply, fill #0

## 2023-08-11 MED ORDER — ATORVASTATIN CALCIUM 40 MG PO TABS
40 mg | ORAL_TABLET | Freq: Every evening | ORAL | 0 refills | 90.00 days | Status: AC
Start: 2023-08-11 — End: ?
  Filled 2023-08-13: qty 90, 90d supply, fill #0

## 2023-08-11 MED ORDER — LOPERAMIDE HCL 2 MG PO CAPS
2 mg | ORAL_CAPSULE | Freq: Four times a day (QID) | ORAL | 0 refills | 8.00 days | Status: AC | PRN
Start: 2023-08-11 — End: ?
  Filled 2023-08-13: qty 30, 8d supply, fill #0

## 2023-08-11 MED ORDER — ASPIRIN 81 MG PO CHEW
81 mg | ORAL_TABLET | Freq: Every day | ORAL | 0 refills | 90.00000 days | Status: AC
Start: 2023-08-11 — End: ?
  Filled 2023-08-13: qty 90, 90d supply, fill #0

## 2023-08-11 MED ORDER — QUETIAPINE FUMARATE 100 MG PO TABS
100 mg | ORAL_TABLET | Freq: Two times a day (BID) | ORAL | 0 refills | 30 days | Status: AC
Start: 2023-08-11 — End: ?
  Filled 2023-08-13: qty 60, 30d supply, fill #0

## 2023-08-11 MED ORDER — DULOXETINE HCL 60 MG PO CPEP
60 mg | ORAL_CAPSULE | Freq: Every day | ORAL | 0 refills | 30.00000 days | Status: AC
Start: 2023-08-11 — End: ?
  Filled 2023-08-13: qty 30, 30d supply, fill #0

## 2023-08-11 MED ORDER — HYOSCYAMINE SULFATE 0.125 MG PO TABS
125 ug | ORAL_TABLET | ORAL | 0 refills | 10 days | Status: AC | PRN
Start: 2023-08-11 — End: ?
  Filled 2023-08-13: qty 60, 10d supply, fill #0

## 2023-08-11 MED ORDER — ONDANSETRON HCL 4 MG PO TABS
4 mg | ORAL_TABLET | Freq: Four times a day (QID) | ORAL | 0 refills | 8.00000 days | Status: AC | PRN
Start: 2023-08-11 — End: ?
  Filled 2023-08-13: qty 30, 8d supply, fill #0

## 2023-08-11 MED ORDER — PANCRELIPASE (LIP-PROT-AMYL) 36000-114000 UNITS PO CPEP
36000 [IU] | ORAL_CAPSULE | Freq: Three times a day (TID) | ORAL | 0 refills | 30.00000 days | Status: AC
Start: 2023-08-11 — End: ?

## 2023-08-11 MED ORDER — PREGABALIN 25 MG PO CAPS
25 mg | ORAL_CAPSULE | Freq: Two times a day (BID) | ORAL | 0 refills | 30.00000 days | Status: AC
Start: 2023-08-11 — End: ?
  Filled 2023-08-13: qty 60, 30d supply, fill #0

## 2023-08-11 MED ORDER — ACETAMINOPHEN 500 MG PO TABS
1000 mg | ORAL_TABLET | Freq: Four times a day (QID) | ORAL | 0 refills | 8 days | Status: AC | PRN
Start: 2023-08-11 — End: ?
  Filled 2023-08-13: qty 60, 8d supply, fill #0

## 2023-08-11 MED ORDER — CLOPIDOGREL BISULFATE 75 MG PO TABS
75 mg | ORAL_TABLET | Freq: Every day | ORAL | 0 refills | 90.00000 days | Status: AC
Start: 2023-08-11 — End: ?
  Filled 2023-08-13: qty 90, 90d supply, fill #0

## 2023-08-11 MED ORDER — PANTOPRAZOLE SODIUM 40 MG PO TBEC
40 mg | ORAL_TABLET | Freq: Every day | ORAL | 0 refills | 30.00000 days | Status: AC
Start: 2023-08-11 — End: ?
  Filled 2023-08-26 (×2): qty 30, 30d supply, fill #0

## 2023-08-11 MED ORDER — LIDOCAINE 5 % EX PTCH
3 | MEDICATED_PATCH | Freq: Every day | TRANSDERMAL | 0 refills | 30.00000 days | Status: AC
Start: 2023-08-11 — End: ?
  Filled 2023-08-13: qty 90, 30d supply, fill #0

## 2023-08-11 MED ORDER — OXYCODONE HCL 10 MG PO TABS
10 mg | ORAL_TABLET | Freq: Four times a day (QID) | ORAL | 0 refills | 3.00000 days | Status: AC | PRN
Start: 2023-08-11 — End: ?
  Filled 2023-08-12 (×2): qty 10, 3d supply, fill #0

## 2023-08-11 MED ADMIN — PREGABALIN 25 MG PO CAPS: 25 mg | ORAL | @ 03:00:00 | Stop: 2023-08-12 | NDC 60687047311

## 2023-08-11 MED ADMIN — OXYCODONE HCL 5 MG PO TABS: 10 mg | ORAL | @ 14:00:00 | Stop: 2023-08-12 | NDC 68084035411

## 2023-08-11 MED ADMIN — B COMPLEX PO CAPS: 1 | ORAL | @ 16:00:00 | Stop: 2023-08-12

## 2023-08-11 MED ADMIN — PANCRELIPASE (LIP-PROT-AMYL) 36000-114000 UNITS PO CPEP: 36000 [IU] | ORAL | @ 16:00:00 | Stop: 2023-08-12 | NDC 00032301613

## 2023-08-11 MED ADMIN — CHOLESTYRAMINE 4 G PO PACK: 4 g | ORAL | @ 04:00:00 | Stop: 2023-08-12

## 2023-08-11 MED ADMIN — FLUTICASONE-SALMETEROL 250-50 MCG/ACT IN AEPB: 1 | RESPIRATORY_TRACT | @ 18:00:00 | Stop: 2023-08-12 | NDC 66993058597

## 2023-08-11 MED ADMIN — QUETIAPINE FUMARATE 100 MG PO TABS: 100 mg | ORAL | @ 03:00:00 | Stop: 2023-08-12 | NDC 60687034911

## 2023-08-11 MED ADMIN — ACETAMINOPHEN 10 MG/ML IV SOLN: 1000 mg | INTRAVENOUS | @ 01:00:00 | Stop: 2023-08-11 | NDC 67457094010

## 2023-08-11 MED ADMIN — HYDROMORPHONE HCL 1 MG/ML IJ SOLN: .2 mg | INTRAVENOUS | @ 03:00:00 | Stop: 2023-08-11 | NDC 00409128331

## 2023-08-11 MED ADMIN — HYDROMORPHONE HCL 1 MG/ML IJ SOLN: .2 mg | INTRAVENOUS | @ 10:00:00 | Stop: 2023-08-11 | NDC 00409128331

## 2023-08-11 MED ADMIN — CHOLESTYRAMINE 4 G PO PACK: 4 g | ORAL | @ 20:00:00 | Stop: 2023-08-12

## 2023-08-11 MED ADMIN — HYDROMORPHONE HCL 1 MG/ML IJ SOLN: .2 mg | INTRAVENOUS | @ 16:00:00 | Stop: 2023-08-11 | NDC 00409128331

## 2023-08-11 MED ADMIN — SODIUM CHLORIDE 0.9 % IV SOLN: 75 mL/h | INTRAVENOUS | @ 09:00:00 | Stop: 2023-08-12 | NDC 00338004904

## 2023-08-11 MED ADMIN — QUETIAPINE FUMARATE 100 MG PO TABS: 100 mg | ORAL | @ 16:00:00 | Stop: 2023-08-12 | NDC 60687034911

## 2023-08-11 MED ADMIN — PANCRELIPASE (LIP-PROT-AMYL) 36000-114000 UNITS PO CPEP: 36000 [IU] | ORAL | @ 01:00:00 | Stop: 2023-09-09

## 2023-08-11 MED ADMIN — ACETAMINOPHEN 10 MG/ML IV SOLN: 1000 mg | INTRAVENOUS | @ 18:00:00 | Stop: 2023-08-11

## 2023-08-11 MED ADMIN — PREGABALIN 25 MG PO CAPS: 25 mg | ORAL | @ 16:00:00 | Stop: 2023-08-12 | NDC 60687047311

## 2023-08-11 MED ADMIN — ASPIRIN 81 MG PO CHEW: 81 mg | ORAL | @ 16:00:00 | Stop: 2023-08-12 | NDC 66553000201

## 2023-08-11 MED ADMIN — LIDOCAINE 5 % EX PTCH: 3 | TRANSDERMAL | @ 18:00:00 | Stop: 2023-08-12

## 2023-08-11 MED ADMIN — ACETAMINOPHEN 10 MG/ML IV SOLN: 1000 mg | INTRAVENOUS | @ 18:00:00 | Stop: 2023-08-12 | NDC 67457094010

## 2023-08-11 MED ADMIN — FLUTICASONE-SALMETEROL 250-50 MCG/ACT IN AEPB: 1 | RESPIRATORY_TRACT | @ 06:00:00 | Stop: 2023-08-12

## 2023-08-11 MED ADMIN — CLOPIDOGREL BISULFATE 75 MG PO TABS: 75 mg | ORAL | @ 16:00:00 | Stop: 2023-08-12

## 2023-08-11 MED ADMIN — ATORVASTATIN CALCIUM 40 MG PO TABS: 40 mg | ORAL | @ 06:00:00 | Stop: 2023-08-12 | NDC 68084009911

## 2023-08-11 MED ADMIN — VITAMIN B-12 500 MCG PO TABS: 1000 ug | ORAL | @ 16:00:00 | Stop: 2023-08-12 | NDC 50268085415

## 2023-08-11 MED ADMIN — DULOXETINE HCL 20 MG PO CPEP: 60 mg | ORAL | @ 16:00:00 | Stop: 2023-08-12 | NDC 60687072311

## 2023-08-11 MED ADMIN — PANTOPRAZOLE SODIUM 40 MG PO TBEC: 40 mg | ORAL | @ 16:00:00 | Stop: 2023-08-12 | NDC 60687073609

## 2023-08-11 MED ADMIN — CHOLESTYRAMINE 4 G PO PACK: 4 g | ORAL | @ 16:00:00 | Stop: 2023-08-12 | NDC 27241013421

## 2023-08-11 MED ADMIN — OXYCODONE HCL 5 MG PO TABS: 10 mg | ORAL | @ 06:00:00 | Stop: 2023-08-12 | NDC 68084035411

## 2023-08-11 MED ADMIN — MAGNESIUM SULFATE 4 GM/100ML IV SOLN: 4 g | INTRAVENOUS | @ 19:00:00 | Stop: 2023-08-11 | NDC 70121172001

## 2023-08-11 MED ADMIN — ACETAMINOPHEN 10 MG/ML IV SOLN: 1000 mg | INTRAVENOUS | @ 09:00:00 | Stop: 2023-08-11 | NDC 67457094010

## 2023-08-11 MED ADMIN — CALCIUM CARBONATE ANTACID 500 (200 CA) MG PO CHEW: 500 mg | ORAL | @ 16:00:00 | Stop: 2023-08-12 | NDC 68084098833

## 2023-08-11 MED ADMIN — CALCIUM CARBONATE ANTACID 500 (200 CA) MG PO CHEW: 500 mg | ORAL | @ 06:00:00 | Stop: 2023-08-12 | NDC 68084098833

## 2023-08-11 NOTE — Other
Patient's Clinical Goal:   Clinical Goal(s) for the Shift: stable vitals, safety, pain relief, good rest  Identify possible barriers to advancing the care plan: nONE  Stability of the patient: Moderately Stable - low risk of patient condition declining or worsening   Progression of Patient's Clinical Goal: Slept on and off through the night. Pain managed with Dilaudid 0.2 mg IVP alternated with Oxycodone 10 mg po. Patient has requested to contact MD for increasing Frequency of Dilaudid IVP. MD notified and ordered Oxycodone 5 mg po x1. Patient was sleeping and did not take extra Oxy 5mg . Noticed Patient ate pound cake and chips and N/V note. BMAT 4 with steady gait. Safety maintained and all needs have addressed. Continue IVF 75 ml/hr with NS and pain management

## 2023-08-11 NOTE — Consults
FINAL DISCHARGE MULTIDISCIPLINARY NOTE  Department of Care Coordination      Admit [id number]  Anticipated Date of Discharge: 08/11/2023    Following QM:VHQIONGE, Rayford Halsted., MD    Home Address:[address]  [place] [address]      DISCHARGE INFORMATION:     Discharge Address: [address]  [place] [address]    Individual(s) notified of discharge plan:  Contact Name: [name],[name] Relationship: Daughter   Contact Number(s): [phone]      Is patient/family informed of discharge?: Yes Is patient/family agreeable of discharge destination?: Yes     Support Systems: Family                 Final Discharge Needs: No Needs       SDOH   Social Drivers of Health (SDOH)  Within the past 12 months, has a lack of transportation kept you from medical appointments, meetings, work, or from getting things needed for daily living? : No  How hard is it for you to pay for the very basics like food, housing, medical care, and heating?: Not very hard  How hard is it for you to pay for prescriptions or medical bills?: Not very hard  In the past 12 months has the electric, gas, oil, or water company threatened to shut off services in your home?: No  In the last 12 months, was there a time when you were not able to pay the mortgage or rent on time?: No  In the last 12 months, how many places have you lived?: 1  In the last 12 months, was there a time when you did not have a steady place to sleep or slept in a shelter (including now)?: No    Donna Gross,  08/11/2023

## 2023-08-11 NOTE — Consults
CASE MANAGER ASSESSMENT      Admit ZOXW:[id number]    Date of Initial CM Assessment: 08/11/2023    Problems: Active Problems:    * No active hospital problems. *       Past Medical History:   Diagnosis Date    Anxiety     Depression     Emphysema, unspecified (HCC/RAF)     Emphysema, unspecified (HCC/RAF)     Fibromyalgia     Gastritis     GERD (gastroesophageal reflux disease)     History of blood transfusion     Intractable nausea and vomiting 01/23/2020    Pancreatitis     Past Surgical History:   Procedure Laterality Date    ABDOMINAL SURGERY      COLON SURGERY            Primary Care Physician:Butler, Josem Kaufmann, MD  Phone:[phone]    LANGUAGE ASSISTANCE:        NEEDS ASSESSMENT:     Level of Function Prior to Admit: Minimal Assist    Primary Living Situation: Lives w/Family         Pre-admission Living Situation: Home/Apartment       Primary Support Systems: Family members    Contact Name: [name],[name] Phone Number: [phone]   Does the patient have a Family/Support System member participating in Discharge Planning?: Yes    DPOA?: No       Bathroom on Main Floor: Yes  Stairs in Home: 0       Prior Treatments / Services: None      Who is your PCP?: Kelle Darting,    Do you have your Primary Care Doctor's office number?: Yes    How often do you visit your doctor?: Semi-annual    Do you need information/education regarding your medical condition?: No               Were you hospitalized in the last 30 days?: No      DISCHARGE ASSESSMENT:     Projected Date of Discharge: 08/11/2023    Anticipated Complex D/C?: No    Projected Discharge to: Home    Discharge Address: [address]  [place] [address]    Projected Discharge Needs: None      Support Identified at Discharge: Child  Name of Discharge Support Person: [name],[name] Phone Number: [phone]     Who is available to transport you upon discharge?: Family Transportation         SDOH     Within the past 12 months, has a lack of transportation kept you from medical appointments, meetings, work, or from getting things needed for daily living? : No  How hard is it for you to pay for the very basics like food, housing, medical care, and heating?: Not very hard  How hard is it for you to pay for prescriptions or medical bills?: Not very hard  In the past 12 months has the electric, gas, oil, or water company threatened to shut off services in your home?: No  In the last 12 months, was there a time when you were not able to pay the mortgage or rent on time?: No  In the last 12 months, how many places have you lived?: 1  In the last 12 months, was there a time when you did not have a steady place to sleep or slept in a shelter (including now)?: No      Milayah Krell Nance Pear,  08/11/2023

## 2023-08-11 NOTE — Discharge Summary
Discharge Summary   Name: Donna Gross MRN: [id number] DOB: 14-Nov-1966   Admit Date: 08/10/2023 D/C Date: 08/11/2023    LOS:   0 days    Admit Attending: Randa Spike, MD Discharge Attending: Dorathy Kinsman., MD    PCP: Kelle Darting, MD Discharge Provider: Dorathy Kinsman, MD     Inpatient Care Team/ Consults:    Hospitalist Outpatient Care Team  No care team member to display     Admission Diagnosis:  (reason for admission)  #acute on chronic abdominal pain, POA  Discharge Diagnosis:  (conditions treated during hospitalization)  #acute on chronic abdominal pain, POA    Disposition:  Home or Self care      Discharge Condition:  stable       HPI:   Donna Gross is a 57 y.o. 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   Patient states that the pain is 10/10, generalized, and radiates to the back. She also reports severe nausea, intermittent non-bloody emesis, and ongoing diarrhea. She says that the pain has never truly subsided since her surgery.      Of note, recently admitted for a similar presentation, At that time GI was consulted - and recommended outpatient colonoscopy and continued symptom management with   Hospital Course:    CT showed no acute process. Pt tolerated a diet without vomiting. Her pain was well controlled on the regimen. She reported being out of her medications at home, so I sent refills of her GI medications. She should follow up with GI to pursue the previously recommended colonoscopy. Discharged in stable condition      Outpatient Provider To-Do List  colonoscopy        LACE+ Score: 50 (08/11/23 1343) Moderate Risk of Readmission  Better Outcomes by Optimizing Safe Transitions        This patient meets criteria for the following BOOST risk conditions which could lead to readmission. Please consider further evaluation or action on the potential barriers below:    Problems with Medications:      19 scheduled medications active at discharge  High-risk active medications: aspirin 81 mg chewable tablet [[id number]], clopidogrel 75 mg tablet [[id number]], pregabalin 25 mg capsule [[id number]]              Pending Labs   The following tests have been collected, but not yet resulted at the time of discharge.    Unresulted Labs (From admission, onward)       Start     Ordered    08/11/23 0400  Methylmalonic Acid,Serum  [[id number]]  Morning draw,   Routine        References:    [facility] [place] Test Directory Information    08/10/23 1512    08/11/23 0400  Copper, Plasma  [[id number]]  Morning draw,   Routine        References:    [facility] Test Directory Information    08/10/23 1528                  The following labs have a preliminary result at the time of discharge.  Please check back if the final results have changed.  Preliminary Resulted Labs (From admission, onward)      None  Discharge Medication List  Coumadin and Opiate Risk Assessment   This patient does not have coumadin on their discharge med list  Controlled meds:   Controlled Medications       Opioid Agonists Disp Start End     oxyCODONE 10 mg tablet 10 tablet 08/11/2023 --    Sig - Route: Take 1 tablet (10 mg total) by mouth every six (6) hours as needed. Max Daily Amount: 40 mg - Oral    Earliest Fill Date: 08/11/2023     oxyCODONE 10 mg tablet (Discontinued) 40 tablet 05/16/2023 08/11/2023    Sig - Route: Take 1 tablet (10 mg total) by mouth every six (6) hours as needed. Max Daily Amount: 40 mg - Oral    Earliest Fill Date: 05/16/2023           Discharge Medication List as of 08/11/2023  1:08 PM        CONTINUE these medications which have CHANGED    Details   acetaminophen 500 mg tablet Take 2 tablets (1,000 mg total) by mouth every six (6) hours as needed for Pain., Starting Wed 08/11/2023, Normal      aspirin 81 mg chewable tablet Chew 1 tablet (81 mg total) by mouth daily., Starting Wed 08/11/2023, Normal      atorvastatin 40 mg tablet Take 1 tablet (40 mg total) by mouth at bedtime., Starting Wed 08/11/2023, Normal      cholestyramine 4 g/dose powder Take 1 packet (4 g total) by mouth three (3) times daily with meals., Starting Wed 08/11/2023, Normal      clopidogrel 75 mg tablet Take 1 tablet (75 mg total) by mouth daily., Starting Wed 08/11/2023, Normal      DULoxetine 60 mg DR capsule Take 1 capsule (60 mg total) by mouth daily., Starting Wed 08/11/2023, Normal      hyoscyamine 0.125 mg tablet Take 1 tablet (125 mcg total) by mouth every four (4) hours as needed for Cramping., Starting Wed 08/11/2023, Normal      lidocaine 5% patch Place 3 patches onto the skin daily 12 hours on 12 hours off., Starting Wed 08/11/2023, Normal      loperamide 2 mg capsule Take 1 capsule (2 mg total) by mouth four (4) times daily as needed for Diarrhea., Starting Wed 08/11/2023, Normal      ondansetron 4 mg tablet Take 1 tablet (4 mg total) by mouth every six (6) hours as needed for Nausea or Vomiting., Starting Wed 08/11/2023, Normal      oxyCODONE 10 mg tablet Take 1 tablet (10 mg total) by mouth every six (6) hours as needed. Max Daily Amount: 40 mg, Starting Wed 08/11/2023, Normal      pancrelipase, Lip-Prot-Amyl, (CREON) 36000 units DR capsule Take 1 capsule (36,000 units of lipase total) by mouth three (3) times daily with meals., Starting Wed 08/11/2023, Normal      pantoprazole 40 mg DR tablet Take 1 tablet (40 mg total) by mouth daily., Starting Wed 08/11/2023, Normal      Polyethylene Glycol 3350 (PEG 3350) 17 GM/SCOOP POWD Mix 1 capful (17 g) in 6 to 8 oz of liquid and drink by mouth two (2) times daily., Starting Wed 08/11/2023, Normal      pregabalin 25 mg capsule Take 1 capsule (25 mg total) by mouth two (2) times daily. Max Daily Amount: 50 mg, Starting Wed 08/11/2023, Normal      QUEtiapine 100 mg tablet Take 1 tablet (100 mg total) by mouth two (2)  times daily., Starting Wed 08/11/2023, Normal           CONTINUE these medications which have NOT CHANGED    Details   albuterol 90 mcg/act inhaler Inhale 2 puffs every six (6) hours as needed for Wheezing or Shortness of Breath., Historical Med      B Complex Vitamins (VITAMIN B COMPLEX) capsule Take 1 capsule by mouth daily., Historical Med      calcium carbonate 500 mg chewable tablet Chew 1 tablet (500 mg total) by mouth two (2) times daily as needed Calcium Carbonate 500 mg is equivalent to 200 mg elemental Calcium., Starting Sat 05/01/2023, Normal      Cholecalciferol (VITAMIN D3) 50 mcg (2000 units) TABS Take 1 tablet (50 mcg total) by mouth daily., Historical Med      clobetasol 0.05% cream Apply topically three (3) times daily as needed (rash)., Historical Med      cyanocobalamin 1000 mcg tablet Take 1 tablet (1,000 mcg total) by mouth daily., Historical Med      fluticasone-salmeterol 250-50 mcg/act diskus inhaler Inhale 1 puff two (2) times daily., Historical Med      ipratropium-albuterol 20-100 mcg/act inhaler Inhale 2 puffs four (4) times daily as needed for Wheezing or Shortness of Breath., Historical Med      Menthol-Methyl Salicylate (ANALGESIC BALM) 10-15 % cream Apply topically four (4) times daily., Starting Sun 05/16/2023, Normal      naloxone 4 mg/0.1 mL nasal spray Call 911. Administer a single spray intranasally into one nostril for opioid overdose. May repeat in 3 minutes if patient is not breathing.., Normal      ranolazine 500 mg 12 hr tablet Take 1 tablet (500 mg total) by mouth two (2) times daily., Historical Med      senna 8.6 mg tablet Take 1 tablet by mouth at bedtime as needed for Constipation., Starting Sun 05/16/2023, Normal      sodium sulfate-potassium sulfate-magnesium sulfate solution At 6pm the evening before colonoscopy, drink 16oz x1 dose, then drink 32oz water over 1 hour. At 5am the day of colonoscopy, repeat above., Normal            Requested Appointments   We will schedule follow up appointment(s) and call you with appointment   time(s)   As directed      Is the patient being discharged to SNF? No  Clinic/Test/Procedure to be scheduled:pcp  Doctor Preferences (If applicable): Kelle Darting, MD  Time Frame: 1 week  Reason for Appointment/Procedure: post d/c    Clinic/Test/Procedure to be scheduled:GI  Doctor Preferences (If applicable): no  Time Frame: next available  Reason for Appointment/Procedure: needs colonoscopy scheduled        Scheduled Appointments  No future appointments.       Discharge Physical Exam/Labs/Imaging/Procedures     Discharge Physical Exam:   BP 90/59 (Patient Position: Lying)  ~ Pulse 86  ~ Temp 36.6 ?C (97.8 ?F) (Oral)  ~ Resp 17  ~ Ht 1.626 m (5' 4'')  ~ Wt 47.9 kg (105 lb 9.6 oz)  ~ SpO2 94%  ~ BMI 18.13 kg/m?   General appearance: alert, appears stated age, and cooperative  Neck: no JVD and supple, symmetrical, trachea midline  Lungs: clear to auscultation bilaterally  Heart: regular rate and rhythm, S1, S2 normal, no murmur, click, rub or gallop  Abdomen: soft, non-tender; bowel sounds normal; no masses,  no organomegaly  Extremities: extremities normal, atraumatic, no cyanosis or edema  Skin: Skin color, texture, turgor  normal. No rashes or lesions  Neurologic: Grossly normal     Relevant labs:     CBC:  Lab Results   Component Value Date    WBC 5.66 08/11/2023    HGB 10.0 (L) 08/11/2023    HCT 32.0 (L) 08/11/2023    PLT 340 08/11/2023     LFT:  Lab Results   Component Value Date    TOTPRO 5.7 (L) 08/11/2023    ALBUMIN 3.5 (L) 08/11/2023    BILITOT 0.2 08/11/2023    ALKPHOS 68 08/11/2023    AST 18 08/11/2023    ALT 10 08/11/2023       BMP:  Lab Results   Component Value Date    NA 138 08/11/2023    K 4.2 08/11/2023    CL 108 (H) 08/11/2023    CO2 23 08/11/2023    BUN 10 08/11/2023    CREAT 0.77 08/11/2023    CALCIUM 8.9 08/11/2023 GLUCOSE 85 08/11/2023       Cal/iCal/MG/Ph:  Lab Results   Component Value Date    CALCIUM 8.9 08/11/2023    MG 1.3 (L) 08/11/2023    PHOS 3.0 08/11/2023     Coags:  No results found for: ''PT'', ''APTT'', ''INR''          None      Relevant Imaging Studies (most recent results only):   CT abd+pelvis angiogram wo+w contrast    Result Date: 08/10/2023  IMPRESSION: No evidence of acute abdominopelvic pathology. Atherosclerosis with patent mesenteric vasculature. Patent superior mesenteric artery stent. Signed by: Quentin Cornwall   08/10/2023 9:10 AM     Procedures & Operations Performed:   none         Nutrition Recommendations & Malnutrition Assessment   I have seen and examined the patient and agree with the RD assessment detailed below:     Patient meets criteria for:      (current weight 47.9 kg (105 lb 9.6 oz), BMI (Calculated): 18.13;  ,  ).   See RD notes for additional details.     The patient was not consulted or assessed by a Registered Dietitian (RD) during this admission.    Discharge Diet Orders:  Regular: all foods are allowed   As directed        Skin/Wound         Discharge Activity Orders    Okay to be physically active   As directed        Rehab Assessment   No PT evaluation this encounter  No OT evaluation this encounter      Case Manager/Home Health Assessment   Discharge Information  Discharge Address: [address]  [place] [address] (08/11/23 1309)  Contact Name: Donna Gross,Donna Gross (08/11/23 1309)  Relationship: Daughter (08/11/23 1309)  Contact Number(s): [phone] (08/11/23 1309)  Is patient/family informed of discharge?: Yes (08/11/23 1309)  Is patient/family agreeable of discharge destination?: Yes (08/11/23 1309)  Support Systems: Family (08/11/23 1309)  Final Discharge Needs: No Needs (08/11/23 1309)              Home Health Orders  There are no active discharge home health orders for this encounter.   Goals of Care Note (if new for this encounter)     I have seen and examined the patient on their discharge day. I have reviewed, edited, and agree with the above discharge summary. More than 30 minutes was personally spent on discharge planning on the day of discharge in  coordination of care, counseling the patient and preparation of discharge.  Dorathy Kinsman, MD  08/11/2023 6:41 PM

## 2023-08-11 NOTE — Other
Patient's Clinical Goal:   Clinical Goal(s) for the Shift: VSS, safety, comfort, pain management  Identify possible barriers to advancing the care plan:   Stability of the patient: Moderately Stable - low risk of patient condition declining or worsening   Progression of Patient's Clinical Goal:     Pt is alert and oriented x4. Respirations even and unlabored on 1L NC. Non monitored. Tolerating regular diet. Voiding. BMAT 3 with SBA. C/o abdominal pain, PRN medication given. Safety maintained, bed in low position, call light in reach, will continue to monitor

## 2023-08-11 NOTE — Nursing Note
[phone]: Paged oncall Dr. Vonita Moss that patient   Requests increasing frequency of IV dilaudid. Dr. Vonita Moss calls back and states will order extra Oxycodone 5 mg po x1.     2310: patient is sleeping.

## 2023-08-12 MED FILL — CHOLESTYRAMINE 4 GM/DOSE PO POWD: 4 g/dose | ORAL | 32 days supply | Qty: 378 | Fill #0

## 2023-08-12 MED FILL — PANTOPRAZOLE SODIUM 40 MG PO TBEC: 40 mg | ORAL | 30 days supply | Qty: 30 | Fill #0

## 2023-08-13 ENCOUNTER — Telehealth: Payer: PRIVATE HEALTH INSURANCE

## 2023-08-13 MED FILL — POLYETHYLENE GLYCOL 3350 17 GM/SCOOP PO POWD: 17 GM/SCOOP | ORAL | 15 days supply | Qty: 510 | Fill #0

## 2023-08-13 NOTE — Telephone Encounter
Discharge Followup Appointment Info      Date of Discharge: 08/11/2023    Discharge Disposition: Home or Self Care   TIme Frame: Within 7 Days    Sched with PCP?: Outside PCP PCP Name: Kelle Darting, MD   Date and Time of appt: 08/19/23 12:45 AM PDT    Was Appt Confirmed?: Neg    Barriers to Scheduling: None    Did you schedule any Specialty visits?: Yes    Additional Appts/Tests/Info Gastro- Needs prior authorization   STAT referral was faxed to [facility]               Left v/m for patient at [phone]  I will follow up and mail out appt letter     Is the patient being discharged to SNF? No  Clinic/Test/Procedure to be scheduled:pcp  Doctor Preferences (If applicable): Kelle Darting, MD  Time Frame: 1 week  Reason for Appointment/Procedure: post d/c  Clinic/Test/Procedure to be scheduled:GI  Doctor Preferences (If applicable): no  Time Frame: next available  Reason for Appointment/Procedure: needs colonoscopy scheduled       Thanks and Best Regards,    Haze Justin    [facility]

## 2023-08-14 LAB — Copper, Plasma: COPPER, SERUM OR PLASMA: 64.6 ug/dL — ABNORMAL LOW (ref 80.0–155.0)

## 2023-08-19 LAB — Methylmalonic Acid,Serum: METHYLMALONIC ACID,SERUM: 0.15 umol/L (ref 0.00–0.40)

## 2023-08-25 MED FILL — CREON 36000-114000 UNITS PO CPEP: 36000 units | ORAL | 30 days supply | Qty: 90 | Fill #0

## 2023-08-26 ENCOUNTER — Inpatient Hospital Stay
Admit: 2023-08-26 | Discharge: 2023-08-29 | Disposition: A | Discharge status: Home / Self Care | Payer: PRIVATE HEALTH INSURANCE | Source: Home / Self Care

## 2023-08-26 DIAGNOSIS — R627 Adult failure to thrive: Secondary | ICD-10-CM

## 2023-08-26 DIAGNOSIS — R109 Unspecified abdominal pain: Secondary | ICD-10-CM

## 2023-08-26 DIAGNOSIS — F17200 Nicotine dependence, unspecified, uncomplicated: Secondary | ICD-10-CM

## 2023-08-26 DIAGNOSIS — R112 Nausea with vomiting, unspecified: Secondary | ICD-10-CM

## 2023-08-26 DIAGNOSIS — G8929 Other chronic pain: Secondary | ICD-10-CM

## 2023-08-26 DIAGNOSIS — R634 Abnormal weight loss: Secondary | ICD-10-CM

## 2023-08-26 LAB — Differential Automated: LYMPHOCYTE PERCENT, AUTO: 21.1 (ref 0.20–0.80)

## 2023-08-26 LAB — Blood Lactate: BLOOD LACTATE: 8 mg/dL (ref 5–18)

## 2023-08-26 LAB — Basic Metabolic Panel
CHLORIDE: 106 mmol/L (ref 96–106)
ESTIMATED GFR 2021 CKD-EPI: 73 mL/min/{1.73_m2} (ref 0.60–1.30)

## 2023-08-26 LAB — Hepatic Funct Panel: BILIRUBIN,TOTAL: 0.2 mg/dL (ref 0.1–1.2)

## 2023-08-26 LAB — CBC: MCH CONCENTRATION: 31.8 g/dL (ref 31.5–35.5)

## 2023-08-26 LAB — Lipase: LIPASE: 27 U/L (ref 13–69)

## 2023-08-26 MED ADMIN — HYDROMORPHONE HCL 1 MG/ML IJ SOLN: 1 mg | INTRAVENOUS | @ 18:00:00 | Stop: 2023-08-26 | NDC 00409128331

## 2023-08-26 MED ADMIN — HYDROMORPHONE HCL 1 MG/ML IJ SOLN: 1 mg | INTRAVENOUS | @ 16:00:00 | Stop: 2023-08-26 | NDC 00409128331

## 2023-08-26 MED ADMIN — ONDANSETRON HCL 4 MG/2ML IJ SOLN: 4 mg | INTRAVENOUS | @ 16:00:00 | Stop: 2023-08-26 | NDC 60505613000

## 2023-08-26 MED ADMIN — METOCLOPRAMIDE HCL 5 MG/ML IJ SOLN: 10 mg | INTRAVENOUS | @ 20:00:00 | Stop: 2023-08-26 | NDC 00409341401

## 2023-08-26 MED ADMIN — HYDROMORPHONE HCL 1 MG/ML IJ SOLN: 1 mg | INTRAVENOUS | @ 22:00:00 | Stop: 2023-08-26 | NDC 00409128331

## 2023-08-26 NOTE — ED Notes
[facility] NOTIFICATION 08/26/2023 06:39 [place], Vuncannon DOB: 1966/07/06 MRN: [id number]    [name]:   YQM:[id number]  Account [id number]  Billing Account [id number]      Criteria Met      2 Visits in 30 Days    6 Visits in 180 Days    History of Sepsis Dx    Security and Safety  No Security Events were found.  ED Care Guidelines  There are currently no ED Care Guidelines for this patient. Please check your facility's medical records system.    Flags      History of Sepsis - Patient has received a diagnosis of Sepsis from an acute or post-acute setting. Apply appropriate clinical planning practices; to learn more visit [email or web address] / Attributed By: [employer] / Attributed On: 05/20/2023       Prescription Drug Data  No Prescription Drug Data was found.    E.D. Visit Count (12 mo.)  Facility Visits   [name] 2   [facility] 2   [address] [facility] 1   Total 5   Note: Visits indicate total known visits.     Recent Emergency Department Visit Summary  Date Facility [facility] Type Diagnoses or Chief Complaint    Aug 26, 2023  [facility].  [place]     Aug 10, 2023  [facility].  [place]      1. Unspecified abdominal pain      1. Abdominal Pain      2. Abnormal weight loss      3. Diarrhea, unspecified      May 22, 2023  [name]  [place]  Emergency  Chief Complaint: CP/ABD PAIN    May 21, 2023  [facility]  [name]  [place]  [place]gency      Dysphagia, unspecified      Chronic obstructive pulmonary disease, unspecified      Gastritis, unspecified, without bleeding      Heart failure, unspecified      Other long term (current) drug therapy      Major depressive disorder, single episode, unspecified      Acquired absence of other specified parts of digestive tract      Anxiety disorder, unspecified      Weakness      Anemia, unspecified      May 19, 2023 [facility]  [place]  [place]  Emergency  Chief Complaint: SOB      Recent Inpatient Visit Summary  Date Facility [facility] Type Diagnoses or Chief Complaint    May 22, 2023  [name]  [place]  Medical Surgical  Chief Complaint: Colitis    May 19, 2023  [facility]  [name]  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  [place]  Cath Lab      1. Generalized abdominal pain      1. Noninfective gastroenteritis and colitis, unspecified      2. Other specified postprocedural states      3. Vascular disorder of intestine, unspecified      4. Tobacco use      5. Diarrhea, unspecified      6. Nausea with vomiting, unspecified      8. Chronic vascular disorders of intestine      9. Unspecified severe protein-calorie malnutrition      10. Anemia, unspecified      Apr 23, 2023  [facility]  [place]  [place]      1. Noninfective gastroenteritis and colitis, unspecified      1. Generalized abdominal pain      2. Other specified postprocedural states      3. Vascular disorder of intestine, unspecified      4. Tobacco use      5. Diarrhea, unspecified      6. Nausea with vomiting, unspecified      8. Chronic vascular disorders of intestine      9. Unspecified severe protein-calorie malnutrition      10. Anemia, unspecified      Feb 02, 2023  [facility].  [place]  CT Scan      1. Dehydration      2. Generalized abdominal pain      3. Abnormal weight loss      4. Unspecified abdominal pain      5. Acute kidney failure, unspecified      6. Diarrhea, unspecified      7. Nausea with vomiting, unspecified        Care Team  [facility] Specialty Phone Fax Service Dates   PEREZ, [name], M.D. Family Medicine   Current      [facility]  This patient has registered at the [facility]  For more information visit: [email or web address]   PLEASE NOTE:     1.   Any care recommendations and other clinical information are provided as guidelines or for historical purposes only, and providers should exercise their own clinical judgment when providing care.    2.   You may only use this information for purposes of treatment, payment or health care operations activities, and subject to the limitations of applicable [facility] Policies.    3.   You should consult directly with the organization that provided a care guideline or other clinical history with any questions about additional information or accuracy or completeness of information provided.    ? 2024 [facility] - [email or web address]

## 2023-08-26 NOTE — ED Provider Notes
[facility]  Emergency Department Service Report    Donna Gross 57 y.o. female , presents with Abdominal Pain    Triage   Arrived on 08/26/2023 at 6:39 AM   Arrived by Walk-in [14]    ED Triage Vitals   Temp Temp Source BP Heart Rate Resp SpO2 O2 Device Pain Score Weight   08/26/23 0642 08/26/23 9147 08/26/23 8295 08/26/23 6213 08/26/23 0642 08/26/23 0642 08/26/23 1130 -- 08/26/23 0639   36.7 ?C (98.1 ?F) Oral 124/79 73 20 95 % None (Room air)  49.9 kg (110 lb)       Pre hospital care:       Allergies   Allergen Reactions   ? Hydrocodone Other (See Comments)     ''burns a hole in stomach''  Tolerates hydromorphone   ? Ibuprofen Other (See Comments)     GI discomfort    ''hurts my stomach''   ? Morphine Itching     Tolerates hydromorphone       History   HPI     Donna Gross is a 57 y.o. female with a history of intestinal stoma prolapse, duodenal ulcer, peptic ulcer disease, hypotension, colitis, superior mesenteric artery stenosis, depression, emphysema, gastritis, GERD, pancreatitis, S/P laparoscopic cholecystectomy/extensive adhesiolysis (03/24/22), and S/P stent placement (04/29/23), presenting with abdominal pain, onset 1 year ago. Patient describes experiencing constant and severe abdominal pain for the past year since undergoing a laparoscopic cholecystectomy/extensive adhesiolysis on 03/24/22. Patient describes abdominal pain worsening with PO intake, along with associated nausea, multiple episodes of vomiting her food, decreased PO food/liquid intake, and unintentional weight loss. Patient reports being seen multiple times in the ED for her current symptoms and states that she underwent a CT scan in the past which was normal. Patient also reports receiving a blood transfusion 3 months ago due to melena and use of blood thinners S/P stent placement on 04/29/23. Patient endorses last taking Percocet (every 4 hours) a few days ago without relief of pain and notes running out of the Percocet. Patient denies being seen by a GI for evaluation of current symptoms. Patient denies history of diabetes mellitus. Patient denies history of undergoing a gastric mobility study. Patient denies any use of blood thinners. Patient denies any hematemesis, hematochezia, or melena.      Language Resource Used?: Patient declined                   Past Medical History:   Diagnosis Date   ? Anxiety    ? Depression    ? Emphysema, unspecified (HCC/RAF)    ? Emphysema, unspecified (HCC/RAF)    ? Fibromyalgia    ? Gastritis    ? GERD (gastroesophageal reflux disease)    ? History of blood transfusion    ? Intractable nausea and vomiting 01/23/2020   ? Pancreatitis         Past Surgical History:   Procedure Laterality Date   ? ABDOMINAL SURGERY     ? COLON SURGERY          Past Family History   family history includes Diabetes in her brother and father; Heart disease in her mother.                 Past Social History   she reports that she has been smoking cigarettes. She uses smokeless tobacco. She reports that she does not drink alcohol and does not use drugs. No history on file for sexual activity.  Physical Exam   Physical Exam  Vitals and nursing note reviewed.   Constitutional:       General: She is not in acute distress.     Appearance: She is well-developed.      Comments: (+) Thin appearing.    HENT:      Head: Normocephalic and atraumatic.   Eyes:      Conjunctiva/sclera: Conjunctivae normal.   Cardiovascular:      Rate and Rhythm: Normal rate and regular rhythm.   Pulmonary:      Effort: Pulmonary effort is normal. No respiratory distress.      Breath sounds: Normal breath sounds.   Abdominal:      Palpations: Abdomen is soft.      Tenderness: There is guarding. There is no rebound.      Comments: (+) Epigastric discomfort.    Musculoskeletal:         General: Normal range of motion.      Cervical back: Normal range of motion and neck supple.   Lymphadenopathy:      Cervical: No cervical adenopathy.   Skin: General: Skin is warm and dry.   Neurological:      Mental Status: She is alert.      Sensory: No sensory deficit.      Comments: Moving all four extremities.   Psychiatric:         Behavior: Behavior normal.       Laboratory Results     Labs Reviewed   CBC (PERFORMABLE) - Abnormal; Notable for the following components:       Result Value    Hemoglobin 11.5 (*)     Red Cell Distribution Width-SD 53.1 (*)     Red Cell Distribution Width-CV 16.3 (*)     Mean Platelet Volume 8.3 (*)     All other components within normal limits   LIPASE - Normal   HEPATIC FUNCT PANEL - Normal   LACTATE - Normal   CBC & AUTO DIFFERENTIAL    Narrative:     The following orders were created for panel order CBC & Plt & Diff.  Procedure                               Abnormality         Status                     ---------                               -----------         ------                     VOZ[[id number]]                          Abnormal            Final result               Differential, Automated[[id number]]                          Final result                 Please view results for these tests on the individual orders.   BASIC  METABOLIC PANEL   DIFFERENTIAL, AUTOMATED (PERFORMABLE)       Imaging Results     No orders to display       Administered Medications     Medication Administration from 08/26/2023 0639 to 08/26/2023 1307         Date/Time Order Dose Route Action Action by Comments     08/26/2023 0859 PDT ondansetron 4 mg/2 mL inj 4 mg 4 mg Intravenous Given Velva Harman, RN --     08/26/2023 0901 PDT HYDROmorphone 1 mg/mL inj 1 mg 1 mg IV Push Given Velva Harman, RN --     08/26/2023 1115 PDT HYDROmorphone 1 mg/mL inj 1 mg 1 mg IV Push Given Sheriff, Juline Patch, RN --            HYDROmorphone 1 mg/mL inj 1 mg, HYDROmorphone 1 mg/mL inj 1 mg is a High-risk medication as it is a parenteral controlled substance.          Procedures   Procedural Sedation  Procedures    ED Course & Medical Decision Making Donna Gross is a 57 y.o. female with a history of intestinal stoma prolapse, duodenal ulcer, peptic ulcer disease, hypotension, colitis, superior mesenteric artery stenosis, depression, emphysema, gastritis, GERD, pancreatitis, S/P laparoscopic cholecystectomy/extensive adhesiolysis (03/24/22), and S/P stent placement (04/29/23), presenting with worsening abdominal pain. Pt d/c'd [name] ED on 08/11/23 for exacerbation of abd pain. She ran out of her home pain medications, which pt says does not help her. Pain not sig worse from prior, but has ongoing discomfort, cannot eat, has lost sig weight. Plan to check labs. If pt has sig leukocytosis concerning for acute infection, or lactic acidosis suggestive of mesenteric ischemia/thrombosis of SMA stent, will get repeat imaging. If workup normal, think pt may benefit from gastric emptying study, GI eval. If pt having ongoing pain, she may need admission for pain control.     ED Course as of 08/26/23 1459   Thu Aug 26, 2023   0932 White Blood Cell Count: 7.67 [ES]   1305 Lipase: 27  No e/o pancreatitis  [ES]   1305 Lactate: 8  Lactate not elevated, do not think pt has mesenteric ischemia, SMA stent thrombus, do not think she needs repeat CT at this time.  [ES]   1459 Pending medicine admit orders, abdominal pain, needs GI and motility W/U [TJ]      ED Course User Index  [ES] Chester Holstein., MD  [TJ] Midge Minium., MD     Medical Decision Making     Problems  Clinical Impressions Complexity of problems addressed      1. Nausea and vomiting, unspecified vomiting type    2. Chronic abdominal pain    3. Weight loss    4. Failure to thrive in adult     Level 5:  []  Acute/chronic illness/injury with threat to life to bodily function  [x]  Chronic illness with severe exacerbation, progression, or side effects of treatment    Level 4:  []  Undiagnosed new problem with uncertain prognosis  []  Acute illness with systemic symptoms  []  Acute complicated injury    Data and Risk  Independent Historian []  Parent as child too young to provide hx []  Family/Caregiver due to AMS/dementia []  EMS due to medical acuity/trauma []  Family/EMS due to behavioral health concern and for collateral []    External Data Reviewed previous workup and mgmt of patient's Abdominal Pain via  [x]  Previous [place] Notes/Labs/Imaging [x]  External Notes/Labs/Imaging  which were non-contributory unless documented otherwise in HPI and ED Course   Considered but decided against  []  CT Head/C-spine given [place] CT/NEXUS/PECARN criteria []  CTA Chest to r/o PE given PERC/Well's criteria []  CT AP to r/o appendicitis given PAS score or family discussion []  Hospitalization due to *  []    Discussed w/ ext HCP []  Consults, PCP/outpt specialists, nursing home. See ED Course for details.   SDOH Affecting Dx/Tx []  Insurance limiting specialist referral []  Housing instability limiting outpt mgmt   []  Financial insecurity limiting medication access []  Substance/ETOH use []    Care de-escalation []  Shared decision-making regarding de-escalation of care (e.x. DNR) or foregoing hospitalization-level of care due to patient wishes/goals of care   Interpretations See ED Course. [x]     If applicable, parenteral controlled substances, drug therapies requiring intensive monitoring for toxicity, and prescription drug management are documented in the the Medications section of this note. If applicable, major and minor procedures are documented separately in Procedure Notes.    Clinical Impression        1. Nausea and vomiting, unspecified vomiting type    2. Chronic abdominal pain    3. Weight loss    4. Failure to thrive in adult          Prescriptions     New Prescriptions    No medications on file       Disposition and Follow-up   Disposition: Admit [3]    No future appointments.    Follow up with:  No follow-up provider specified.    Return precautions are specified on After Visit Summary.        Scribe Signature   I, Lurena Nida, have acted as a Stage manager for patient Donna Gross on behalf of Dr. Dione Housekeeper at 08/26/2023 at 7:39 AM. All documentation underwent a comprehensive review by the listed physician(s) and received their approval upon signing.                Chester Holstein., MD  08/26/23 [phone]

## 2023-08-26 NOTE — Discharge Instructions
09/17/2023 at 12:00 PM Dr. Carollee Massed  - Gastroenterology  [address]  [place], [place] [address]  Phone: [phone]

## 2023-08-27 LAB — Differential Automated: ABSOLUTE IMMATURE GRAN COUNT: 0.02 10*3/uL (ref 0.00–0.04)

## 2023-08-27 LAB — Magnesium: MAGNESIUM: 1.4 meq/L (ref 1.4–1.9)

## 2023-08-27 LAB — Phosphorus: PHOSPHORUS: 4.1 mg/dL (ref 2.3–4.4)

## 2023-08-27 LAB — Basic Metabolic Panel: TOTAL CO2: 24 mmol/L (ref 20–30)

## 2023-08-27 LAB — Glucose,POC: GLUCOSE,POC: 114 mg/dL — ABNORMAL HIGH (ref 65–99)

## 2023-08-27 LAB — CBC: MEAN CORPUSCULAR VOLUME: 91.6 fL (ref 79.3–98.6)

## 2023-08-27 LAB — Hepatic Funct Panel: ALANINE AMINOTRANSFERASE: 8 U/L (ref 8–70)

## 2023-08-27 MED ADMIN — DICLOFENAC SODIUM 1 % EX GEL: TOPICAL | @ 04:00:00 | Stop: 2023-08-30 | NDC 00067815203

## 2023-08-27 MED ADMIN — HYDROMORPHONE HCL 1 MG/ML IJ SOLN: 1 mg | INTRAVENOUS | @ 03:00:00 | Stop: 2023-08-28 | NDC 00409128331

## 2023-08-27 MED ADMIN — PANTOPRAZOLE SODIUM 40 MG PO TBEC: 40 mg | ORAL | @ 15:00:00 | Stop: 2023-09-26 | NDC 60687073609

## 2023-08-27 MED ADMIN — ACETAMINOPHEN 500 MG PO TABS: 1000 mg | ORAL | @ 01:00:00 | Stop: 2023-08-28 | NDC 50580045711

## 2023-08-27 MED ADMIN — PREGABALIN 25 MG PO CAPS: 25 mg | ORAL | @ 04:00:00 | Stop: 2023-08-30 | NDC 60687047311

## 2023-08-27 MED ADMIN — QUETIAPINE FUMARATE 100 MG PO TABS: 100 mg | ORAL | @ 04:00:00 | Stop: 2023-08-30 | NDC 60687034911

## 2023-08-27 MED ADMIN — RANOLAZINE ER 500 MG PO TB12: 500 mg | ORAL | @ 16:00:00 | Stop: 2023-08-30

## 2023-08-27 MED ADMIN — ATORVASTATIN CALCIUM 20 MG PO TABS: 40 mg | ORAL | @ 04:00:00 | Stop: 2023-08-30 | NDC 00904629161

## 2023-08-27 MED ADMIN — HYDROMORPHONE HCL 1 MG/ML IJ SOLN: 1 mg | INTRAVENOUS | @ 15:00:00 | Stop: 2023-08-28 | NDC 00409128331

## 2023-08-27 MED ADMIN — HYDROMORPHONE HCL 1 MG/ML IJ SOLN: 1 mg | INTRAVENOUS | @ 07:00:00 | Stop: 2023-08-28 | NDC 00409128331

## 2023-08-27 MED ADMIN — MAGNESIUM SULFATE 4 GM/100ML IV SOLN: 4 g | INTRAVENOUS | @ 22:00:00 | Stop: 2023-08-28 | NDC 70121172001

## 2023-08-27 MED ADMIN — NICOTINE 7 MG/24HR TD PT24: 7 mg | TRANSDERMAL | @ 04:00:00 | Stop: 2023-08-30 | NDC 00536589488

## 2023-08-27 MED ADMIN — CHOLESTYRAMINE 4 G PO PACK: 4 g | ORAL | @ 04:00:00 | Stop: 2023-08-30 | NDC 27241013421

## 2023-08-27 MED ADMIN — FLUTICASONE-SALMETEROL 250-50 MCG/ACT IN AEPB: 1 | RESPIRATORY_TRACT | @ 04:00:00 | Stop: 2023-08-30 | NDC 66993058597

## 2023-08-27 MED ADMIN — CYANOCOBALAMIN 1000 MCG/ML IJ SOLN: 1000 ug | INTRAMUSCULAR | @ 23:00:00 | Stop: 2023-08-27 | NDC 70069000501

## 2023-08-27 MED ADMIN — HYDROMORPHONE HCL 1 MG/ML IJ SOLN: 1 mg | INTRAVENOUS | @ 08:00:00 | Stop: 2023-08-28 | NDC 00409128331

## 2023-08-27 MED ADMIN — CHOLECALCIFEROL 25 MCG (1000 UT) PO TABS: 50 ug | ORAL | @ 23:00:00 | Stop: 2023-08-30 | NDC 48433010401

## 2023-08-27 MED ADMIN — LACTATED RINGERS IV BOLUS: 500 mL | INTRAVENOUS | @ 22:00:00 | Stop: 2023-08-27 | NDC 00338011704

## 2023-08-27 MED ADMIN — B COMPLEX PO CAPS: 1 | ORAL | @ 15:00:00 | Stop: 2023-08-30 | NDC 00536137801

## 2023-08-27 MED ADMIN — PREGABALIN 25 MG PO CAPS: 25 mg | ORAL | @ 15:00:00 | Stop: 2023-08-30 | NDC 60687047311

## 2023-08-27 MED ADMIN — HEPARIN SODIUM (PORCINE) 5000 UNIT/ML IJ SOLN: 5000 [IU] | SUBCUTANEOUS | @ 16:00:00 | Stop: 2023-08-30

## 2023-08-27 MED ADMIN — OXYCODONE HCL 5 MG PO TABS: 15 mg | ORAL | @ 13:00:00 | Stop: 2023-08-28 | NDC 68084035411

## 2023-08-27 MED ADMIN — ASPIRIN 81 MG PO CHEW: 81 mg | ORAL | @ 15:00:00 | Stop: 2023-08-30 | NDC 66553000201

## 2023-08-27 MED ADMIN — HYDROMORPHONE HCL 1 MG/ML IJ SOLN: 1 mg | INTRAVENOUS | @ 11:00:00 | Stop: 2023-08-28 | NDC 00409128331

## 2023-08-27 MED ADMIN — LIDOCAINE 5 % EX PTCH: 1 | TRANSDERMAL | @ 03:00:00 | Stop: 2023-09-26

## 2023-08-27 MED ADMIN — POLYETHYLENE GLYCOL 3350 17 G PO PACK: 17 g | ORAL | @ 04:00:00 | Stop: 2023-08-30

## 2023-08-27 MED ADMIN — ONDANSETRON HCL 4 MG/2ML IJ SOLN: 4 mg | INTRAVENOUS | @ 16:00:00 | Stop: 2023-08-30 | NDC 60505613000

## 2023-08-27 MED ADMIN — OXYCODONE HCL 5 MG PO TABS: 15 mg | ORAL | @ 05:00:00 | Stop: 2023-08-27 | NDC 68084035411

## 2023-08-27 MED ADMIN — CHOLESTYRAMINE 4 G PO PACK: 4 g | ORAL | @ 13:00:00 | Stop: 2023-08-30 | NDC 27241013421

## 2023-08-27 MED ADMIN — POLYETHYLENE GLYCOL 3350 17 G PO PACK: 17 g | ORAL | @ 16:00:00 | Stop: 2023-08-30

## 2023-08-27 MED ADMIN — OXYCODONE HCL 5 MG PO TABS: 15 mg | ORAL | @ 23:00:00 | Stop: 2023-08-28 | NDC 68084035411

## 2023-08-27 MED ADMIN — COPPER 0.2MG/ML SOLUTION: 2 mg | ORAL | @ 23:00:00 | Stop: 2023-09-02 | NDC 00409409201

## 2023-08-27 MED ADMIN — ONDANSETRON HCL 4 MG/2ML IJ SOLN: 4 mg | INTRAVENOUS | @ 03:00:00 | Stop: 2023-08-30 | NDC 60505613000

## 2023-08-27 MED ADMIN — QUETIAPINE FUMARATE 100 MG PO TABS: 100 mg | ORAL | @ 15:00:00 | Stop: 2023-08-30 | NDC 60687034911

## 2023-08-27 MED ADMIN — CLOPIDOGREL BISULFATE 75 MG PO TABS: 75 mg | ORAL | @ 15:00:00 | Stop: 2023-08-30 | NDC 68084053611

## 2023-08-27 MED ADMIN — DICLOFENAC SODIUM 1 % EX GEL: TOPICAL | @ 20:00:00 | Stop: 2023-08-30

## 2023-08-27 MED ADMIN — PANCRELIPASE (LIP-PROT-AMYL) 36000-114000 UNITS PO CPEP: 36000 [IU] | ORAL | @ 15:00:00 | Stop: 2023-08-30 | NDC 00032301613

## 2023-08-27 MED ADMIN — RANOLAZINE ER 500 MG PO TB12: 500 mg | ORAL | @ 06:00:00 | Stop: 2023-08-30 | NDC 60687054911

## 2023-08-27 MED ADMIN — DULOXETINE HCL 30 MG PO CPEP: 60 mg | ORAL | @ 15:00:00 | Stop: 2023-08-30 | NDC 60687073411

## 2023-08-27 MED ADMIN — FLUTICASONE-SALMETEROL 250-50 MCG/ACT IN AEPB: 1 | RESPIRATORY_TRACT | @ 15:00:00 | Stop: 2023-08-30 | NDC 66993058597

## 2023-08-27 MED ADMIN — OXYCODONE HCL 5 MG PO TABS: 15 mg | ORAL | @ 09:00:00 | Stop: 2023-08-28 | NDC 68084035411

## 2023-08-27 MED ADMIN — NON FORMULARY: ORAL | @ 03:00:00 | Stop: 2023-08-27

## 2023-08-27 MED ADMIN — PANCRELIPASE (LIP-PROT-AMYL) 36000-114000 UNITS PO CPEP: 36000 [IU] | ORAL | @ 01:00:00 | Stop: 2023-08-30 | NDC 00032301613

## 2023-08-27 MED ADMIN — HEPARIN SODIUM (PORCINE) 5000 UNIT/ML IJ SOLN: 5000 [IU] | SUBCUTANEOUS | @ 04:00:00 | Stop: 2023-08-30

## 2023-08-27 MED ADMIN — PANCRELIPASE (LIP-PROT-AMYL) 36000-114000 UNITS PO CPEP: 36000 [IU] | ORAL | @ 20:00:00 | Stop: 2023-08-30

## 2023-08-27 MED ADMIN — DICLOFENAC SODIUM 1 % EX GEL: TOPICAL | @ 13:00:00 | Stop: 2023-08-30 | NDC 00067815203

## 2023-08-27 MED ADMIN — CHOLESTYRAMINE 4 G PO PACK: 4 g | ORAL | @ 20:00:00 | Stop: 2023-08-30

## 2023-08-27 MED ADMIN — COPPER CHLORIDE IVPB: 2 mg | INTRAVENOUS | @ 22:00:00 | Stop: 2023-08-27

## 2023-08-27 NOTE — Other
Patient's Clinical Goal:   Clinical Goal(s) for the Shift: comfort, safety, pain control  Identify possible barriers to advancing the care plan: none  Stability of the patient: Moderately Unstable - medium risk of patient condition declining or worsening    Progression of Patient's Clinical Goal: pt c/o abd pain and given tylenol. Pt remains safe on the floor. Pt on cardiac monitor, NSR.

## 2023-08-27 NOTE — Other
Patient's Clinical Goal:   Clinical Goal(s) for the Shift: maintain hemodynamic stability, manage pain and nausea, no falls, and provide comfort and safety  Identify possible barriers to advancing the care plan: n/a  Stability of the patient: Moderately Stable - low risk of patient condition declining or worsening   Progression of Patient's Clinical Goal:     Vitals:    08/26/23 1918 08/26/23 1959 08/26/23 2355 08/27/23 0409   BP: 136/85  112/75 90/61   Patient Position: Lying  Lying Lying   Pulse: 68  80 87   Resp: 18  18 18    Temp: 37.1 ?C (98.7 ?F)  36.4 ?C (97.5 ?F) 36.5 ?C (97.7 ?F)   TempSrc: Oral  Oral Oral   SpO2: 97% 93% 94% 93%   Weight:   48.5 kg (106 lb 14.8 oz)             AOX4, NSR, RA, continent x2, last BM 9/52024, skin intact, BMAT 4,right upper arm IV saline lock, & regular diet. Orders reviewed and completed. Will continue to endorse to oncoming nurse.       Plan: manage pain, consult GI & general surgery, monitor labs & comfort

## 2023-08-27 NOTE — Progress Notes
DEPARTMENT OF MEDICINE   INPATIENT NOTE TEMPLATE   DIVISION OF HOSPITAL MEDICINE  PROGRESS NOTE    PATIENT: Donna Gross               MRN: [id number]   PCP: Kelle Darting, MD  ADMISSION DATE: 08/26/2023  HOSPITAL DAY: 1  DATE OF SERVICE: 08/27/2023   CC: Abdominal Pain (Acute/ chronic abd pain. Patient states she has been seen in ER many times '' but no one takes her serious''. )    Subjective & Significant Overnight Events    08/27/23: Admitted yesterday reports pain, nausea/vomiting/diarrhea that has been ongoing for the past year. She reports pain improved with current regimen but would like an answer for her condition.    Medications   Objective (click to expand/collapse)   Scheduled:  aspirin, 81 mg, Oral, Daily  atorvastatin, 40 mg, Oral, QHS  cholestyramine, 4 g, Oral, TID  clopidogrel, 75 mg, Oral, Daily  diclofenac Sodium, 4 g, Topical, QID  DULoxetine, 60 mg, Oral, Daily  fluticasone-salmeterol, 1 puff, Inhalation, BID  heparin, 5,000 Units, Subcutaneous, BID  lidocaine, 1 patch, Transdermal, Q24H  nicotine patch, 7 mg, Transdermal, Daily  pancrelipase (Lip-Prot-Amyl), 36,000 units of lipase, Oral, TID w/meals  pantoprazole, 40 mg, Oral, Daily  polyethylene glycol, 17 g, Oral, BID  pregabalin, 25 mg, Oral, BID  QUEtiapine, 100 mg, Oral, BID  ranolazine, 500 mg, Oral, BID  vitamin B complex, 1 capsule, Oral, Daily    Infusions:      PRN:  acetaminophen, albuterol, calcium carbonate, HYDROmorphone, ipratropium-albuterol, ondansetron injection/IVPB, oxyCODONE **OR** oxyCODONE **OR** oxyCODONE, senna      Physical Exam     Vital Signs:   Temp:  [36.3 ?C (97.3 ?F)-37.1 ?C (98.7 ?F)] 36.3 ?C (97.3 ?F)  Heart Rate:  [59-104] 104  Resp:  [15-18] 17  BP: (89-136)/(57-85) 89/57  NBP Mean:  [68-97] 68  SpO2:  [92 %-99 %] 92 %       I&O's:   I/O last 2 completed shifts:  In: 1078 [P.O.:1078]  Out: -  Oxygen Support:  Oxygen Therapy  SpO2: (!) 92 %  O2 Device: None (Room air)  Flow Rate (L/min): 1 L/min Weight for the past 720 hrs (Last 4 readings):   Weight   08/26/23 2355 48.5 kg (106 lb 14.8 oz)   08/26/23 0639 49.9 kg (110 lb)     System Normal Findings Abnormal Findings   General []  Normal general appearance   []  In acute distress  [x]  Chronically ill appearing  []  Sedated           []  Non Verbal  []  Other:   HENMT/Neck [x]  Oropharynx clear and moist  []  Normal JVP []  Dry mucous membranes  []  JVP ( cm)  []  Other:   Resp [x]  CTA B/L []  Intubated                 []  On Supp O2 ()  []  Wheezing ()  []  Rales ()   []  Crackles ()  []  Other:   CV [x]  Normal rhythm/rate  []  No murmurs  []  No lower extremity edema  []  Normal PMI   Normal pulses:   []  Radial []  Femoral  []  Pedal []   Abnormal rhythm ()  []   Murmur ()  []  S3    []  S4  Abnormal Pulses:  []  Radial ()  []  Femoral ()   []  DP ()  []  PT ()  []  Lower extremity edema       []   R ()   []  L ()  []  Bilateral ()  []  Other:   GI [x]  Soft  []  No tenderness to palpation  []  No rebound/guarding []  Tenderness ()  []  Fluid Wave  []  Hepatomegaly  Other: Some tenderness with palpation   MSK [x]  Normal strength/tone []  Other:    Skin [x]  No rash []  Other:   Neuro [x]  AO x 3  []  Normal strength  []  Normal sensation  []  Normal reflexes  []  Normal cerebellar exam []  AO x 0       []  AO x 1     []  AO x 2  Abnormal neuro exam:  []  Abnormal strength  []  Abnormal sensation  []  Abnormal reflexes  []  Abnormal cerebellar exam       Labs/Studies     Labs  Objective (click to expand/collapse)   Last CBC:   Results for orders placed or performed during the hospital encounter of 08/26/23   CBC   Result Value Ref Range    White Blood Cell Count 7.06 4.16 - 9.95 x10E3/uL    Red Blood Cell Count 3.83 (L) 3.96 - 5.09 x10E6/uL    Hemoglobin 10.7 (L) 11.6 - 15.2 g/dL    Hematocrit 40.9 81.1 - 45.2 %    Mean Corpuscular Volume 91.6 79.3 - 98.6 fL    Mean Corpuscular Hemoglobin 27.9 26.4 - 33.4 pg    MCH Concentration 30.5 (L) 31.5 - 35.5 g/dL    Red Cell Distribution Width-SD 55.4 (H) 36.9 - 48.3 fL    Red Cell Distribution Width-CV 16.4 (H) 11.1 - 15.5 %    Platelet Count, Auto 345 143 - 398 x10E3/uL    Mean Platelet Volume 8.3 (L) 9.3 - 13.0 fL    Nucleated RBC%, automated 0.0 No Ref. Range %    Absolute Nucleated RBC Count 0.00 0.00 - 0.00 x10E3/uL    Neutrophil Abs (Prelim) 3.70 See Absolute Neut Ct. x10E3/uL   Differential, Automated   Result Value Ref Range    Neutrophil Percent, Auto 52.4 No Ref. Range %    Lymphocyte Percent, Auto 36.0 No Ref. Range %    Monocyte Percent, Auto 8.8 No Ref. Range %    Eosinophil Percent, Auto 2.1 No Ref. Range %    Basophil Percent, Auto 0.4 No Ref. Range %    Immature Granulocytes% 0.3 No Reference Range %    Absolute Neut Count 3.70 1.80 - 6.90 x10E3/uL    Absolute Lymphocyte Count 2.54 1.30 - 3.40 x10E3/uL    Absolute Mono Count 0.62 0.20 - 0.80 x10E3/uL    Absolute Eos Count 0.15 0.00 - 0.50 x10E3/uL    Absolute Baso Count 0.03 0.00 - 0.10 x10E3/uL    Absolute Immature Gran Count 0.02 0.00 - 0.04 x10E3/uL   CBC & Auto Differential    Narrative    The following orders were created for panel order CBC & Auto Differential.  Procedure                               Abnormality         Status                     ---------                               -----------         ------  YIR[[id number]]                          Abnormal            Final result               Differential, Automated[[id number]]                          Final result                 Please view results for these tests on the individual orders.      Results for orders placed or performed during the hospital encounter of 05/12/23   CBC   Result Value Ref Range    White Blood Cell Count 5.37 4.16 - 9.95 x10E3/uL    Red Blood Cell Count 3.54 (L) 3.96 - 5.09 x10E6/uL    Hemoglobin 9.3 (L) 11.6 - 15.2 g/dL    Hematocrit 50.0 (L) 34.9 - 45.2 %    Mean Corpuscular Volume 86.2 79.3 - 98.6 fL    Mean Corpuscular Hemoglobin 26.3 (L) 26.4 - 33.4 pg    MCH Concentration 30.5 (L) 31.5 - 35.5 g/dL Red Cell Distribution Width-SD 50.9 (H) 36.9 - 48.3 fL    Red Cell Distribution Width-CV 16.1 (H) 11.1 - 15.5 %    Platelet Count, Auto 382 143 - 398 x10E3/uL    Mean Platelet Volume 8.5 (L) 9.3 - 13.0 fL    Nucleated RBC%, automated 0.0 No Ref. Range %    Absolute Nucleated RBC Count 0.00 0.00 - 0.00 x10E3/uL     Last BMP:         Micro  Objective (click to expand/collapse)   No results found for this or any previous visit (from the past 168 hour(s)).       Radiology  Objective (click to expand/collapse)   No imaging has been resulted in the last 24 hours       Additional studies   Objective (click to expand/collapse)   EGD =          Independent Review of Studies  Prior CT     I reviewed these notes that directly relate to the patient's acute and/or chronic medical problems with documentation of the salient findings if any:  Inpatient notes:   Last Gastroenterology Note    No notes exist for this encounter.        and      Last Hospitalist Note           08/26/23 1931 H&P addendum by Lavenia Atlas., MD                 A&P   Donna Gross is a 57 y.o. female pmhx anxiety, depression, emphysema, fibromyalgia, GERD, multiple abdominal surgeries including partial gastrectomy with gastrojejunostomy c/b adhesions, multiple hospitalizations for intractable abdominal pain due to SMA stenosis s/p recent SMA stenting (04/29/2023) here for intractable chronic abdominal pain admitted for acute on chronic abdominal pain    #Acute on chronic abdominal pain, POA  #SMA stenosis, chronic, POA  #celiac stenosis, POA  #CT evidence of CAD, POA  #s/p angio and stenting of SMA of 04/29/23  #multiple abdominal surgeries c/b adhesions, chronic, POA  #GERD, chronic, POA  Pt with long-standing history of abdominal pain for about 1 year. Has been admitted several times previously with similar complaints.   Last CTAP 8/20 when patient presented  to ED for similar complaints, was not admitted at that time, CT showing no acute abdominal pathology.  Some atherosclerosis with patent mesenteric vasculature and a patient SMA stent.    Etiology: Still undifferentiated, possible stent complication however unlikely given recent CTAP with patent stent vs dysmotility from opioid use. Considered diabetic thoracic polyradiculopathy though no history of diabetes (A1C 5.4% on admission) and no evidence of neuropathy. Overall most likely multifactorial from history of extensive abdominal surgeries, GERD, fibromyalgia, opioid dependence, pancreatic insufficiency, etc.  Plan:  - Consider GI consult if pain not improved  - Seen by GI on prior admission recommended for outpatient colonoscopy  - Defer MRI for now  - pain regimen:        - tylenol 1 g q8hr PRN       - pregabalin 25 mg BID       - home duloxetine 60 mg qday       - continue home ranolazine 500 mg BID        - topicals: lidocaine patch, analgesic balm       - oxy pain scale, dilaudid IV for breakthrough pain   - continue home pantoprazole 40 mg daily  - E/W medicine consult patient declined at this time  - consider follow up with G/S as outpatient  - continue atorvastatin 40 mg at bedtime  - SMA stenting: continue home aspirin 81 mg daily and plavix 75 mg daily     #diarrhea, chronic, intermittent, POA  #constipation, chronic, intermittent, POA  #nausea/vomiting, chronic, POA  #hx of pancreatic insufficiency, chronic, POA  Pt with chronic n/v/d for the past year. Has had negative work-up including c. Diff, celiac panel, H.pylori testing, parasitic enteric panel. Pt was unable to tolerate colonoscopy and had negative EGD.   - Consider GI consult as inpatient if pain not improved  - bowel regimen: miralax BID, senna PRN, bisacodyl suppository PRN  - continue home creon 36,000 units of lipase TID  - nausea: zofran 4 mg q8hrs PRN     #Poor PO intake, chronic, POA  #Acute weight loss, acute on chronic POA  #Aevere muscle loss with cachexia due to severe malnutrition, POA  Previously evaluated by nutrition who had recommended boost TID for assistance with malnutrition likely 2/2 poor PO intake from abdominal pain. Reports weight loss.   BMI 18.88  - nutrition consulted, appreciate recs  - boost TID  - consider initiation of appetite stimulant  - Send Vitamin d, vitamin c, B12, thiamine, zinc    #Normocytic anemia, chronic, POA  Pt presents with hemoglobin 11.5  Baseline around 9-10. Anemia though to be secondary to nutritional deficiencies from poor PO intake.  - trend CBC daily  - consider iron supplementation  - f/u iron studies, ferritin      #Emphysema, POA  #Tobacco use, POA  Previous PPD smoker from age 20 and reduced over last 7 years to 1 cigarette daily.   - nicotine patch 7 mg   - fluticasone-salmeterol inhaler 1 puff BID, albuterol PRN  - counseled on nicotine cessation      #anxiety, chronic, POA   #major depression, chronic, POA  #fibromyalgia, chronic, POA  #insomnia, chronic, POA   All stable. Denies SI/HI.   - continue home seroquel 150 mg BID   - continue home duloxetine 60 mg daily           # IP Checklist  - Diet: Diet regular  Oral nutrition supplements Breakfast, Lunch, Dinner; Boost Max [employer]  -  VTE PPX: SCDs  - LDA:   Peripheral IV 22 G Right;Other (Comment) Arm (1)    Advance Care Planning:   Full Code,  Primary Emergency Contact: [name],[name], Mobile Phone: [phone]    Disposition:  Expected post-hospitalization disposition will be to Home.    Plan discussed with attending Dr Lorrene Reid    Author:    Tennis Ship L. Gizaw  08/27/2023 1:04 PM    Attending Addendum:     I agree with the NP documentation above. I have performed the substantive portion of the exam including obtaining a separate history, performing a separate physical exam, and have formulated the plan of care above.     57 y/o woman with PMHx of anxiety, depression, emphysema, fibromyalgia, GERD, multiple abdominal surgeries including partial gastrectomy with gastrojejunostomy c/b adhesions, multiple hospitalizations for intractable abdominal pain due to SMA stenosis s/p recent SMA stenting (04/29/2023) who presents with acute on chronic abd pain and nausea and poor PO intake.  No alarm symptoms, labs stable.  Will consider GI consult if symptoms don't improve, for now continue supportive care, MM pain control.      53 minutes were spent personally by me today on this encounter which include today's pre-visit review of the chart, obtaining appropriate history, performing an evaluation, documentation and discussion of management with details supported within the note for today's visit. The time documented was exclusive of any time spent on the separately billed procedure.            Brandy Hale, MD  Staff Physician  [facility]

## 2023-08-27 NOTE — Consults
NUTRITION ASSESSMENT (Adult)    Admit Date: 08/26/2023     Date of Birth: 1966-08-02 Gender: female MRN: [id number]     Date of Assessment: 08/27/23   Status: Consult and Assessment   Indication: BMI < or equal to 18.5   Subjective: Visited patient in the ED. She reports that her GI issues have remained the same. She still experiences abdominal pain when eating, greatly impacting her nutritional intake. She also reports nausea,vomiting, and diarrhea. She continues to report minimal PO intake. She denies taking any nutrition supplements, vitamins or minerals at home. She is also frustrated at how her symptoms have not improved despite frequent hospitalizations   Problems: Active Problems:    * No active hospital problems. *     MD H&P 08/26/23: Donna Gross is a 57 y.o. female with a history of intestinal stoma prolapse, duodenal ulcer, peptic ulcer disease, hypotension, colitis, superior mesenteric artery stenosis, depression, emphysema, gastritis, GERD, pancreatitis, S/P laparoscopic cholecystectomy/extensive adhesiolysis (03/24/22), and S/P stent placement (04/29/23), presenting with acute worsening of chronic abdominal pain.  She first started to experience ab pain 1 year ago, and since her lap chole/adhesiolysis and stent placement, has been experiencing intermittent severe abdominal pain.  The pain worsens with PO intake, and is associated with nausea and vomiting occasionally.  She endorses unintentional weight loss of about 50+ lbs during this time also.  She takes percocet at home but is running out.  Denies fever, SOB, dysuria, diarrhea, constipation, hematochezia, or melena.  Further denies recent travel, sick contacts, or URI type symptoms.      Past Medical History:   Diagnosis Date    Anxiety     Depression     Emphysema, unspecified (HCC/RAF)     Emphysema, unspecified (HCC/RAF)     Fibromyalgia     Gastritis     GERD (gastroesophageal reflux disease)     History of blood transfusion     Intractable nausea and vomiting 01/23/2020    Pancreatitis     Past Surgical History:   Procedure Laterality Date    ABDOMINAL SURGERY      COLON SURGERY             Data   Intake/Outputs: I/O last 2 completed shifts:  In: 1078 [P.O.:1078]  Out: -     Pertinent Medications: Scheduled Meds:   aspirin  81 mg Oral Daily    atorvastatin  40 mg Oral QHS    cholestyramine  4 g Oral TID    clopidogrel  75 mg Oral Daily    diclofenac Sodium  4 g Topical QID    DULoxetine  60 mg Oral Daily    fluticasone-salmeterol  1 puff Inhalation BID    heparin  5,000 Units Subcutaneous BID    lidocaine  1 patch Transdermal Q24H    nicotine patch  7 mg Transdermal Daily    pancrelipase (Lip-Prot-Amyl)  36,000 units of lipase Oral TID w/meals    pantoprazole  40 mg Oral Daily    polyethylene glycol  17 g Oral BID    pregabalin  25 mg Oral BID    QUEtiapine  100 mg Oral BID    ranolazine  500 mg Oral BID    vitamin B complex  1 capsule Oral Daily     Continuous Infusions:  PRN Meds:acetaminophen, albuterol, calcium carbonate, HYDROmorphone, ipratropium-albuterol, ondansetron injection/IVPB, oxyCODONE **OR** oxyCODONE **OR** oxyCODONE, senna    FDI Target Drugs: No      Pertinent Labs:  Recent Labs     08/27/23  0436 08/26/23  0855   NA 138 138   K 4.9 4.5   BUN 15 14   CREAT 0.98 0.91   GLUCOSE 98 83   MG 1.4  --    CALCIUM 9.3 9.4   PHOS 4.1  --    ALBUMIN 4.1 4.2   WBC 7.06 7.67   HGB 10.7* 11.5*   HCT 35.1 36.2   BILITOT <0.2 0.2   AST 17 23   ALT 8 10   ALKPHOS 71 73   LIPASE  --  27       Trended Labs     05/12/23  0658 01/28/23  0646   HGBA1C 5.4  --    TRIGLY  --  93      Trended Labs     08/11/23  0700 08/11/23  0659 04/24/23  1555 04/24/23  0302 04/24/23  0301   VITD25OH  --   --   --   --  17*   VITAMINB1  --   --   --   --  117   VITAMINB12 268  --   --  271  --    MEMALSER  --  0.15  --   --   --    FOLATE 17.5  --   --   --  21.8   FE 35*  --   --  42  --    TIBC 356  --   --  381  --    FEBINDSAT 10  --   --  11  --    FERRITIN 15  --   --  27 --    CUSER 64.6*  --   --   --   --    ZINCSER  --   --  94.0  --   --        Accu-Chek: No results found in last 72 hours    Respiratory Status / O2 Device: None (Room air)    Pressure Injury: None    Additional Data:   Peripheral IV 22 G Right;Other (Comment) Arm (1)     Edema  Flowsheet Row Most Recent Value   Generalized     Sacral     RUE     LUE     RLE     LLE        Diet Info   Allergies:   Hydrocodone, Ibuprofen, and Morphine  Cultural/Ethnic/Religious/Other Food Preferences:  None     Nutrition prior to admit:  Regular foods. Experiments with foods to see which she is able to tolerate best. Eats oatmeal, grits with unsalted butter, pinto beans, baked chicken, mashed potatoes regularly  Current diet order:     Diets/Supplements/Feeds   Diet    Diet regular     Start Date/Time: 08/26/23 1740      Number of Occurrences: Until Specified   Nourishments    Oral nutrition supplements Breakfast, Lunch, Dinner; Boost Max [employer]     Start Date/Time: 08/26/23 1930      Number of Occurrences: Until Specified     Order Questions:      Meal: Breakfast      Meal: Lunch      Meal: Dinner      Supplement: Boost Max [employer]     PO % consumed: 26 to 50% (per last meal)  Enteral Nutrition: None  Parenteral Nutrition: None  Other Caloric Sources: none  Anthropometrics:   Height: 162.6 cm (5' 4'')  Admit Weight: 49.9 kg (110 lb) (08/26/23 1610)  Current Weight: 48.5 kg (106 lb 14.8 oz)  BMI: 18.35  IBW: 54 kg/120 lbs  IBW %: 89  Adj BW (if applicable): 53 kg  UBW:  (UTA)  UBW %:         Weight History  Wt Readings from Last 15 Encounters:   08/26/23 48.5 kg (106 lb 14.8 oz)   08/10/23 47.9 kg (105 lb 9.6 oz)   05/14/23 47.2 kg (104 lb)   04/11/23 50.9 kg (112 lb 3.4 oz)   03/29/23 50.7 kg (111 lb 12.8 oz)   01/28/23 45.4 kg (100 lb)   11/24/22 49.2 kg (108 lb 7.5 oz)   07/23/22 52.2 kg (115 lb)   07/01/22 56.5 kg (124 lb 9 oz)   05/18/22 57 kg (125 lb 10.6 oz)   05/07/22 54.4 kg (120 lb 0.3 oz)   04/21/22 59.6 kg (131 lb 6.3 oz)   03/27/22 64.4 kg (141 lb 15.6 oz)   02/13/22 64.1 kg (141 lb 6.4 oz)   01/30/22 68.9 kg (151 lb 12.8 oz)      Estimated Nutrition Needs   Based on admit weight: 48.5 kg   Estimated Calorie Needs: 1455 - 1697.5 kcal/day (30 - 35 kcal/kg)  Estimated Protein Needs: 58.2 - 72.75 g protein/day (1.2 - 1.5 g protein/kg)     Diet Education   To be provided prior to discharge (as needed)          Malnutrition Assessment using AND/ASPEN Consensus Criteria    Malnutrition in the Context of: Mild-moderate inflammation/chronic disease   Energy Intake: <75% of estimated energy requirement for > or = 1 month; Supportive data: Pt reports ongoing poor intake at home that has been ongoing for several months   Weight Loss: Does not meet criterion    Nutrition-Focused Physical Exam (NFPE): 08/27/2023  Subcutaneous Fat Loss: Moderate  Areas examined/observed: Orbital Upper, Arm  Muscle Loss: Moderate  Areas examined/observed: Temple, Clavicle, Deltoid, Interosseous   Severe clavicle depletion noted    Patient meets criteria for: Severe protein calorie malnutrition   Using collected data and clinical judgement from RD    Nutrition Assessment   Anthropometrics: Body mass index is 18.35 kg/m?Marland Kitchen Based on current admit wt of 48/5 kg /106 lb. Per chart review, pt's weight has appeared stable over the past four months. However, pt had severe weight loss prior to that.     Nutrition: Patient is on a regular diet. Patient reports continued poor intake at home. She reports ''experimenting'' with different foods to see which she tolerates best. Her appetite and intake are greatly impacted by the abdominal pain she experiences while eating.     Tolerance/GI: Last BM Date: 08/26/23. Patient is not on bowel regimen. She received PERT. She reports abdominal pain when eating. She also experiences nausea,vomiting, and diarrhea     Skin: intact    Labs: Low hgb, chem labs WNL    Micronutrients: Vitamin B1 WNL on 5/24,  Vitamin B12 WNL but on lower end from 8/24, Folate WNL on 8/24  Low Vitamin D on 5/24, Low copper on 8/24,      Recommendations / Care Plan   1. Continue regular diet    - RD to allow pt to order smaller frequent meals   - Snacks ordered for pt   - Continue Boost Max TID  3. Consider the following vitamin and mineral supplementation  -  vitamin B-complex capsule once daily  - 1,000 mcg cyanocobalamin x 1 month, then recheck levels   - IV copper 2mg /day x 5 days   - 50 mcg cholecalciferol once daily PO x 1 month  4. Rec'd checking vitamin D given prior hx of low levels   5. Bowel regimen per MD  6. Monitor PO I/O, daily weights and labs as able   7. May consider nocturnal enteral nutrition if pt unable to tolerate PO diet and if within goals of care     Author: Jeni Salles, RD pager [phone]   08/27/2023 12:49 PM       [employer] NP  RD to monitor and follow-up per nutrition protocol

## 2023-08-27 NOTE — Telephone Encounter
Update; Elienai Thane [id number]    Received authorization for Laurette Schimke, patient was referred within insurance network     Future appointments  09/17/2023 at 12:00 PM Dr. Carollee Massed  - Gastroenterology  [address], [address]  [place], [place] [address]  Phone: [phone]  Fax: [phone]  (calling clinic to see if we can get something sooner) rings no answer/ mailbox is full   I will follow up     Confirmed with patient at [phone]  Appt letter will be mailed out       [facility]

## 2023-08-27 NOTE — H&P
Hospitalist History & Physical    PATIENT NAME:  Donna Gross  AGE: 57 y.o.  MRN:  [id number]  DOB:  January 20, 1966    DATE OF SERVICE:  08/26/2023    PRIMARY CARE PHYSICIAN: Kelle Darting, MD  ADMITTING PHYSICIAN: Lavenia Atlas, MD    CHIEF COMPLAINT: abdominal pain    HISTORY OF PRESENT ILLNESS:  Donna Gross is a 57 y.o. female with a history of intestinal stoma prolapse, duodenal ulcer, peptic ulcer disease, hypotension, colitis, superior mesenteric artery stenosis, depression, emphysema, gastritis, GERD, pancreatitis, S/P laparoscopic cholecystectomy/extensive adhesiolysis (03/24/22), and S/P stent placement (04/29/23), presenting with acute worsening of chronic abdominal pain.  She first started to experience ab pain 1 year ago, and since her lap chole/adhesiolysis and stent placement, has been experiencing intermittent severe abdominal pain.  The pain worsens with PO intake, and is associated with nausea and vomiting occasionally.  She endorses unintentional weight loss of about 50+ lbs during this time also.  She takes percocet at home but is running out.  Denies fever, SOB, dysuria, diarrhea, constipation, hematochezia, or melena.  Further denies recent travel, sick contacts, or URI type symptoms.          EMERGENCY DEPARTMENT COURSE:   ED Triage Vitals   Temp Temp Source BP Heart Rate Resp SpO2 O2 Device Pain Score Weight   08/26/23 0642 08/26/23 0642 08/26/23 1914 08/26/23 7829 08/26/23 0642 08/26/23 0642 08/26/23 1130 08/26/23 1814 08/26/23 0639   36.7 ?C (98.1 ?F) Oral 124/79 73 20 95 % None (Room air) Ten 49.9 kg (110 lb)       Medications   acetaminophen tab 1,000 mg (1,000 mg Oral Given 08/26/23 1814)   albuterol 90 mcg/act inh 2 puff (has no administration in time range)   aspirin chew tab 81 mg (has no administration in time range)   atorvastatin tab 40 mg (has no administration in time range)   vitamin B complex cap 1 capsule (has no administration in time range)   calcium carbonate chew tab 500 mg (has no administration in time range)   cholestyramine pwd packet 4 g (has no administration in time range)   clopidogrel tab 75 mg (has no administration in time range)   DULoxetine DR cap 60 mg (has no administration in time range)   ipratropium-albuterol 20-100 mcg/act inh 2 puff (has no administration in time range)   fluticasone-salmeterol 250-50 mcg/act diskus inhaler 1 puff (has no administration in time range)   pancrelipase (Lip-Prot-Amyl) (Creon) DR cap 36,000 units of lipase (36,000 units of lipase Oral Given 08/26/23 1819)   pantoprazole DR tab 40 mg (has no administration in time range)   polyethylene glycol pwd pkt 17 g (has no administration in time range)   pregabalin cap 25 mg (has no administration in time range)   QUEtiapine tab 100 mg (has no administration in time range)   non formulary 500 mg (has no administration in time range)   senna tab 1 tablet (has no administration in time range)   ondansetron 4 mg/2 mL inj 4 mg (4 mg Intravenous Given 08/26/23 0859)   HYDROmorphone 1 mg/mL inj 1 mg (1 mg IV Push Given 08/26/23 0901)   HYDROmorphone 1 mg/mL inj 1 mg (1 mg IV Push Given 08/26/23 1115)   metoclopramide 5 mg/mL inj 10 mg (10 mg IV Push Given 08/26/23 1322)   HYDROmorphone 1 mg/mL inj 1 mg (1 mg IV Push Given 08/26/23 1456)       REVIEW OF  SYSTEMS:   Fourteen point ROS was negative other than the pertinent positives and negatives listed above    PAST MEDICAL HISTORY:  Past Medical History:   Diagnosis Date    Anxiety     Depression     Emphysema, unspecified (HCC/RAF)     Emphysema, unspecified (HCC/RAF)     Fibromyalgia     Gastritis     GERD (gastroesophageal reflux disease)     History of blood transfusion     Intractable nausea and vomiting 01/23/2020    Pancreatitis        PAST SURGICAL HISTORY:  Past Surgical History:   Procedure Laterality Date    ABDOMINAL SURGERY      COLON SURGERY         SOCIAL HISTORY:  Social History     Tobacco Use    Smoking status: Every Day     Types: Cigarettes Smokeless tobacco: Current    Tobacco comments:     1 cig per day   Substance Use Topics    Alcohol use: No       FAMILY HISTORY:  Family History   Problem Relation Age of Onset    Heart disease Mother     Diabetes Brother     Diabetes Father     Anesthesia problems Neg Hx     Malignant hypertension Neg Hx     Hypotension Neg Hx     Malignant hyperthermia Neg Hx     Pseudochol deficiency Neg Hx        ALLERGIES:  Allergies   Allergen Reactions    Hydrocodone Other (See Comments)     ''burns a hole in stomach''  Tolerates hydromorphone    Ibuprofen Other (See Comments)     GI discomfort    ''hurts my stomach''    Morphine Itching     Tolerates hydromorphone       OUTPATIENT MEDICATIONS:  Prior to Admission medications    Medication Sig Start Date End Date Taking? Authorizing Provider   acetaminophen 500 mg tablet Take 2 tablets (1,000 mg total) by mouth every six (6) hours as needed for Pain. 08/11/23   Dekitani, Meghan E., MD   albuterol 90 mcg/act inhaler Inhale 2 puffs every six (6) hours as needed for Wheezing or Shortness of Breath.    PROVIDER, HISTORICAL   aspirin 81 mg chewable tablet Chew 1 tablet (81 mg total) by mouth daily. 08/11/23   Dekitani, Lindie Spruce E., MD   atorvastatin 40 mg tablet Take 1 tablet (40 mg total) by mouth at bedtime. 08/11/23   Dekitani, Rayford Halsted., MD   B Complex Vitamins (VITAMIN B COMPLEX) capsule Take 1 capsule by mouth daily.    PROVIDER, HISTORICAL   calcium carbonate 500 mg chewable tablet Chew 1 tablet (500 mg total) by mouth two (2) times daily as needed Calcium Carbonate 500 mg is equivalent to 200 mg elemental Calcium.  Patient taking differently: Chew 1 tablet (500 mg total) by mouth two (2) times daily Calcium Carbonate 500 mg is equivalent to 200 mg elemental Calcium. 05/01/23   Debbe Mounts, MD   Cholecalciferol (VITAMIN D3) 50 mcg (2000 units) TABS Take 1 tablet (50 mcg total) by mouth daily.    PROVIDER, HISTORICAL   cholestyramine 4 g/dose powder Take 1 scoop (4 grams total) by mouth three (3) times daily with meals. 08/11/23   Dekitani, Rayford Halsted., MD   clobetasol 0.05% cream Apply topically three (3) times daily as needed (rash).  [provider]   clopidogrel 75 mg tablet Take 1 tablet (75 mg total) by mouth daily. 08/11/23   Dekitani, Rayford Halsted., MD   cyanocobalamin 1000 mcg tablet Take 1 tablet (1,000 mcg total) by mouth daily.    PROVIDER, HISTORICAL   DULoxetine 60 mg DR capsule Take 1 capsule (60 mg total) by mouth daily. 08/11/23   Dekitani, Rayford Halsted., MD   fluticasone-salmeterol 250-50 mcg/act diskus inhaler Inhale 1 puff two (2) times daily.    PROVIDER, HISTORICAL   hyoscyamine 0.125 mg tablet Take 1 tablet (125 mcg total) by mouth every four (4) hours as needed for Cramping. 08/11/23   Dekitani, Meghan E., MD   ipratropium-albuterol 20-100 mcg/act inhaler Inhale 2 puffs four (4) times daily as needed for Wheezing or Shortness of Breath.    PROVIDER, HISTORICAL   lidocaine 5% patch Place 3 patches onto the skin daily 12 hours on 12 hours off. 08/11/23   Dekitani, Lindie Spruce E., MD   loperamide 2 mg capsule Take 1 capsule (2 mg total) by mouth four (4) times daily as needed for Diarrhea. 08/11/23   Dekitani, Rayford Halsted., MD   Menthol-Methyl Salicylate (ANALGESIC BALM) 10-15 % cream Apply topically four (4) times daily. 05/16/23   Lorrine Kin, MD   naloxone 4 mg/0.1 mL nasal spray Call 911. Administer a single spray intranasally into one nostril for opioid overdose. May repeat in 3 minutes if patient is not breathing.. 05/16/23 05/15/24  Lorrine Kin, MD   ondansetron 4 mg tablet Take 1 tablet (4 mg total) by mouth every six (6) hours as needed for Nausea or Vomiting. 08/11/23   Dekitani, Rayford Halsted., MD   oxyCODONE 10 mg tablet Take 1 tablet (10 mg total) by mouth every six (6) hours as needed. Max Daily Amount: 40 mg 08/11/23   Dekitani, Lindie Spruce E., MD   pancrelipase, Lip-Prot-Amyl, (CREON) 36000 units DR capsule Take 1 capsule (36,000 units of lipase total) by mouth three (3) times daily with meals. 08/11/23   Dekitani, Rayford Halsted., MD   pantoprazole 40 mg DR tablet Take 1 tablet (40 mg total) by mouth daily. 08/11/23   Dekitani, Rayford Halsted., MD   Polyethylene Glycol 3350 (PEG 3350) 17 GM/SCOOP POWD Mix 1 capful (17 grams) in 6 to 8 ounces of liquid and drink by mouth two (2) times daily. 08/11/23   Dekitani, Rayford Halsted., MD   pregabalin 25 mg capsule Take 1 capsule (25 mg total) by mouth two (2) times daily. Max Daily Amount: 50 mg 08/11/23   Dekitani, Lindie Spruce E., MD   QUEtiapine 100 mg tablet Take 1 tablet (100 mg total) by mouth two (2) times daily. 08/11/23   Dekitani, Rayford Halsted., MD   ranolazine 500 mg 12 hr tablet Take 1 tablet (500 mg total) by mouth two (2) times daily.    PROVIDER, HISTORICAL   senna 8.6 mg tablet Take 1 tablet by mouth at bedtime as needed for Constipation. 05/16/23   Lorrine Kin, MD   sodium sulfate-potassium sulfate-magnesium sulfate solution At 6pm the evening before colonoscopy, drink 16oz x1 dose, then drink 32oz water over 1 hour. At 5am the day of colonoscopy, repeat above. 05/14/23   Johny Chess., MD       PHYSICAL EXAM:  BP 128/70 (Patient Position: Sitting)  ~ Pulse 59  ~ Temp 36.8 ?C (98.2 ?F) (Oral)  ~ Resp 15  ~ Wt 49.9 kg (110 lb)  ~ SpO2 95%  ~ BMI 18.88 kg/m?  General: No acute distress  Eyes: EOMI  Neck: No JVD  Heart: RRR, S1/S2 present  Lungs: CTAB, no wheeze/crackles/rales  Abdomen: Soft, non-distended  Skin: Grossly normal  Extremities: No edema  Neuro: Grossly normal  Psych: A+O x 3    LABS/STUDIES: Independently reviewed by me on [facility].  Recent Results (from the past 24 hour(s))   ED INFORMATION EXCHANGE Ordered by an unspecified provider    Collection Time: 08/26/23  6:39 AM   Result Value Ref Range    Emer. Dept. Info Exchange - Care Plan      Emer. Dept. Engineer, mining. Dept. Info Exchange - 30 day Visit Count 2     Emer. Dept. Info Exchange - 180 day Visit Count 8    Basic Metabolic Panel Collection Time: 08/26/23  8:55 AM   Result Value Ref Range    Sodium 138 135 - 146 mmol/L    Potassium 4.5 3.6 - 5.3 mmol/L    Chloride 106 96 - 106 mmol/L    Total CO2 21 20 - 30 mmol/L    Anion Gap 11 8 - 19 mmol/L    Glucose 83 65 - 99 mg/dL    Creatinine 3.08 6.57 - 1.30 mg/dL    Estimated GFR 73 See GFR Additional Information mL/min/1.42m2    GFR Additional Information See Comment     Urea Nitrogen 14 7 - 22 mg/dL    Calcium 9.4 8.6 - 84.6 mg/dL   Lipase    Collection Time: 08/26/23  8:55 AM   Result Value Ref Range    Lipase 27 13 - 69 U/L   Hepatic Funct Panel    Collection Time: 08/26/23  8:55 AM   Result Value Ref Range    Total Protein 6.7 6.1 - 8.2 g/dL    Albumin 4.2 3.9 - 5.0 g/dL    Bilirubin,Total 0.2 0.1 - 1.2 mg/dL    Bilirubin,Conjugated <0.2 <=0.3 mg/dL    Alkaline Phosphatase 73 37 - 113 U/L    Aspartate Aminotransferase 23 13 - 62 U/L    Alanine Aminotransferase 10 8 - 70 U/L   Lactate    Collection Time: 08/26/23  8:55 AM   Result Value Ref Range    Blood Lactate 8 5 - 18 mg/dL   CBC    Collection Time: 08/26/23  8:55 AM   Result Value Ref Range    White Blood Cell Count 7.67 4.16 - 9.95 x10E3/uL    Red Blood Cell Count 4.08 3.96 - 5.09 x10E6/uL    Hemoglobin 11.5 (L) 11.6 - 15.2 g/dL    Hematocrit 96.2 95.2 - 45.2 %    Mean Corpuscular Volume 88.7 79.3 - 98.6 fL    Mean Corpuscular Hemoglobin 28.2 26.4 - 33.4 pg    MCH Concentration 31.8 31.5 - 35.5 g/dL    Red Cell Distribution Width-SD 53.1 (H) 36.9 - 48.3 fL    Red Cell Distribution Width-CV 16.3 (H) 11.1 - 15.5 %    Platelet Count, Auto 384 143 - 398 x10E3/uL    Mean Platelet Volume 8.3 (L) 9.3 - 13.0 fL    Nucleated RBC%, automated 0.0 No Ref. Range %    Absolute Nucleated RBC Count 0.00 0.00 - 0.00 x10E3/uL    Neutrophil Abs (Prelim) 5.58 See Absolute Neut Ct. x10E3/uL   Differential, Automated    Collection Time: 08/26/23  8:55 AM   Result Value Ref Range    Neutrophil Percent, Auto  72.7 No Ref. Range %    Lymphocyte Percent, Auto 21.1 No Ref. Range %    Monocyte Percent, Auto 4.6 No Ref. Range %    Eosinophil Percent, Auto 0.8 No Ref. Range %    Basophil Percent, Auto 0.4 No Ref. Range %    Immature Granulocytes% 0.4 No Reference Range %    Absolute Neut Count 5.58 1.80 - 6.90 x10E3/uL    Absolute Lymphocyte Count 1.62 1.30 - 3.40 x10E3/uL    Absolute Mono Count 0.35 0.20 - 0.80 x10E3/uL    Absolute Eos Count 0.06 0.00 - 0.50 x10E3/uL    Absolute Baso Count 0.03 0.00 - 0.10 x10E3/uL    Absolute Immature Gran Count 0.03 0.00 - 0.04 x10E3/uL         No orders to display         ASSESSMENT/PLAN:   Donna Gross is a 32F with hx of anxiety, depression, emphysema, fibromyalgia, GERD, multiple abdominal surgeries including partial gastrectomy with gastrojejunostomy c/b adhesions, multiple hospitalizations for intractable abdominal pain due to SMA stenosis s/p recent SMA stenting (04/29/2023) here for intractable chronic abdominal pain.       #acute on chronic abdominal pain, POA  #SMA stenosis, chronic, POA  #celiac stenosis, POA  #CT evidence of CAD, POA  #s/p angio and stenting of SMA of 04/29/23  #multiple abdominal surgeries c/b adhesions, chronic, POA  #GERD, chronic, POA  Pt with long-standing history of abdominal pain for about 1 year. Has been admitted several times previously with similar complaints.   Last CTAP 8/20 when patient presented to ED for similar complaints, was not admitted at that time, CT showing no acute abdominal pathology.  Some atherosclerosis with patent mesenteric vasculature and a patient SMA stent.    Etiology: Donna Gross, possible stent complication however unlikely given recent CTAP with patent stent vs dysmotility from opioid use. Considered diabetic thoracic polyradiculopathy though no history of diabetes (A1C 5.4% on admission) and no evidence of neuropathy. Overall most likely multifactorial from history of extensive abdominal surgeries, GERD, fibromyalgia, opioid dependence, pancreatic insufficiency, etc.  Plan:  - consult to GI in AM for further evaluation  - MRI abdomen ordered  - pain regimen:        - tylenol 1 g q8hr PRN       - pregabalin 25 mg BID       - home duloxetine 60 mg qday       - continue home ranolazine 500 mg BID        - topicals: lidocaine patch, analgesic balm       - oxy pain scale, dilaudid IV for breakthrough pain   - consider c/s to chronic pain in AM for buprenorphine vs ketamine   - continue home pantoprazole 40 mg daily  - E/W medicine consult placed  - consider general surgery consult in the AM (history of adhesions) if MRI yields any findings  - continue atorvastatin 40 mg at bedtime  - SMA stenting: continue home aspirin 81 mg daily and plavix 75 mg daily     #diarrhea, chronic, intermittent, POA  #constipation, chronic, intermittent, POA  #nausea/vomiting, chronic, POA  #hx of pancreatic insufficiency, chronic, POA  Pt with chronic n/v/d for the past year. Has had negative work-up including c. Diff, celiac panel, H.pylori testing, parasitic enteric panel. Pt was unable to tolerate colonoscopy and had negative EGD.   - GI consult in the AM  - bowel regimen: miralax BID, senna PRN, bisacodyl suppository PRN  -  continue home creon 36,000 units of lipase TID  - nausea: zofran 4 mg q8hrs PRN     #Poor PO intake, chronic, POA  #acute weight loss, acute on chronic POA  #severe muscle loss with cachexia due to severe malnutrition, POA  Previously evaluated by nutrition who had recommended boost TID for assistance with malnutrition likely 2/2 poor PO intake from abdominal pain. Reports weight loss.   BMI 18.88  - nutrition consulted, appreciate recs  - boost TID  - consider initiation of appetite stimulant     #normocytic anemia, chronic, POA  Pt presents with hemoglobin 11.5  Baseline around 9-10. Anemia though to be secondary to nutritional deficiencies from poor PO intake.  - trend CBC daily  - consider iron supplementation  - f/u iron studies, ferritin      #emphysema, POA  #tobacco use, POA  Previous PPD smoker from age 73 and reduced over last 7 years to 1 cigarette daily.   - nicotine patch 7 mg   - fluticasone-salmeterol inhaler 1 puff BID, albuterol PRN  - counseled on nicotine cessation      #anxiety, chronic, POA   #major depression, chronic, POA  #fibromyalgia, chronic, POA  #insomnia, chronic, POA   All stable. Denies SI/HI.   - continue home seroquel 150 mg BID   - continue home duloxetine 60 mg daily        #Dispo: pending improvement in PO intake and pain management      # FEN: Regular diet. Electrolyte repletion PRN  # PPX: VTE: encourage ambulation. SCDs, sub q heparin in case of need to hold suddenly for GI procedure   # Code Status:  FULL CODE      Signed:  Reesa Chew, MD MS  [facility] Hospitalist Service  Pager [phone]    08/26/2023 at 6:28 PM    75 minutes were spent personally by me today on this encounter which include today's pre-visit review of the chart, obtaining appropriate history, discussing with external providers, performing an evaluation, documentation and discussion of management with details supported within the note for today's visit. The time documented was exclusive of any time spent on the separately billed procedure.    Arieyana Bredehoeft ~ [id number] ~ Margo Aye L/HALL L ~ Kelle Darting, MD ~ Full Code ~ Consults: Needs GI, chronic pain in AM ~ Billed Date: 9/5

## 2023-08-28 ENCOUNTER — Ambulatory Visit: Payer: PRIVATE HEALTH INSURANCE

## 2023-08-28 DIAGNOSIS — J9602 Acute respiratory failure with hypercapnia: Secondary | ICD-10-CM

## 2023-08-28 LAB — Glucose,POC
GLUCOSE,POC: 148 mg/dL — ABNORMAL HIGH (ref 65–99)
GLUCOSE,POC: 103 mg/dL — ABNORMAL HIGH (ref 65–99)
GLUCOSE,POC: 87 mg/dL (ref 65–99)
GLUCOSE,POC: 77 mg/dL (ref 65–99)

## 2023-08-28 LAB — Basic Metabolic Panel
CALCIUM: 9.6 mg/dL (ref 8.6–10.4)
CHLORIDE: 103 mmol/L (ref 96–106)
GLUCOSE: 88 mg/dL (ref 65–99)

## 2023-08-28 LAB — Blood Gases, venous,POC
BASE EXCESS,VENOUS,POC: -1 mmol/L (ref 37–65)
O2 SAT/MEASURED,VENOUS,POC: 8.5 (ref 37–65)
PCO2,VENOUS,POC: 50 mm[Hg] (ref 37–65)

## 2023-08-28 LAB — Extra Royal Blue Top

## 2023-08-28 LAB — Procalcitonin: PROCALCITONIN: 0.12 ug/L — ABNORMAL HIGH (ref <0.10)

## 2023-08-28 LAB — Folate,Serum: FOLATE,SERUM: 13.1 ng/mL (ref 8.1–30.4)

## 2023-08-28 LAB — Vitamin B12: VITAMIN B12: >4000 pg/mL — ABNORMAL HIGH (ref 254–1060)

## 2023-08-28 LAB — HCT for Folate Review: HCT FOR FOLATE: 35.5 (ref 34.9–45.2)

## 2023-08-28 LAB — Vitamin D,25-Hydroxy: VITAMIN D,25-HYDROXY: 16 ng/mL — ABNORMAL LOW (ref 20–50)

## 2023-08-28 LAB — CBC: MCH CONCENTRATION: 30.4 g/dL — ABNORMAL LOW (ref 31.5–35.5)

## 2023-08-28 LAB — Magnesium: MAGNESIUM: 2.2 meq/L — ABNORMAL HIGH (ref 1.4–1.9)

## 2023-08-28 MED ADMIN — LIDOCAINE 5 % EX PTCH: 3 | TRANSDERMAL | @ 17:00:00 | Stop: 2023-08-30

## 2023-08-28 MED ADMIN — HYDROMORPHONE HCL 1 MG/ML IJ SOLN: 1 mg | INTRAVENOUS | @ 14:00:00 | Stop: 2023-08-28 | NDC 00409128331

## 2023-08-28 MED ADMIN — ONDANSETRON HCL 4 MG/2ML IJ SOLN: 4 mg | INTRAVENOUS | @ 06:00:00 | Stop: 2023-09-26 | NDC 60505613000

## 2023-08-28 MED ADMIN — CHOLESTYRAMINE 4 G PO PACK: 4 g | ORAL | @ 14:00:00 | Stop: 2023-08-30 | NDC 42806026698

## 2023-08-28 MED ADMIN — LACTATED RINGERS IV BOLUS: 500 mL | INTRAVENOUS | @ 20:00:00 | Stop: 2023-08-28 | NDC 00338011704

## 2023-08-28 MED ADMIN — ACETAMINOPHEN 500 MG PO TABS: 1000 mg | ORAL | @ 20:00:00 | Stop: 2023-08-30

## 2023-08-28 MED ADMIN — FLUTICASONE-SALMETEROL 250-50 MCG/ACT IN AEPB: 1 | RESPIRATORY_TRACT | @ 04:00:00 | Stop: 2023-08-30 | NDC 66993058597

## 2023-08-28 MED ADMIN — DICLOFENAC SODIUM 1 % EX GEL: TOPICAL | @ 01:00:00 | Stop: 2023-08-30 | NDC 00067815203

## 2023-08-28 MED ADMIN — POLYETHYLENE GLYCOL 3350 17 G PO PACK: 17 g | ORAL | @ 16:00:00 | Stop: 2023-08-30

## 2023-08-28 MED ADMIN — PANCRELIPASE (LIP-PROT-AMYL) 36000-114000 UNITS PO CPEP: 36000 [IU] | ORAL | @ 01:00:00 | Stop: 2023-08-30 | NDC 00032301613

## 2023-08-28 MED ADMIN — HYDROMORPHONE HCL 1 MG/ML IJ SOLN: 1 mg | INTRAVENOUS | @ 06:00:00 | Stop: 2023-08-28 | NDC 00409128331

## 2023-08-28 MED ADMIN — ASPIRIN 81 MG PO CHEW: 81 mg | ORAL | @ 16:00:00 | Stop: 2023-08-30 | NDC 66553000201

## 2023-08-28 MED ADMIN — DICLOFENAC SODIUM 1 % EX GEL: TOPICAL | @ 20:00:00 | Stop: 2023-08-30

## 2023-08-28 MED ADMIN — PREGABALIN 25 MG PO CAPS: 25 mg | ORAL | @ 04:00:00 | Stop: 2023-08-30 | NDC 60687047311

## 2023-08-28 MED ADMIN — DICLOFENAC SODIUM 1 % EX GEL: TOPICAL | @ 14:00:00 | Stop: 2023-09-26 | NDC 00067815203

## 2023-08-28 MED ADMIN — DICLOFENAC SODIUM 1 % EX GEL: TOPICAL | @ 06:00:00 | Stop: 2023-08-30 | NDC 00067815203

## 2023-08-28 MED ADMIN — FLUTICASONE-SALMETEROL 250-50 MCG/ACT IN AEPB: 1 | RESPIRATORY_TRACT | @ 16:00:00 | Stop: 2023-08-30

## 2023-08-28 MED ADMIN — QUETIAPINE FUMARATE 100 MG PO TABS: 100 mg | ORAL | @ 16:00:00 | Stop: 2023-08-30 | NDC 60687034911

## 2023-08-28 MED ADMIN — FLUTICASONE-SALMETEROL 250-50 MCG/ACT IN AEPB: 1 | RESPIRATORY_TRACT | @ 16:00:00 | Stop: 2023-08-30 | NDC 66993058597

## 2023-08-28 MED ADMIN — RANOLAZINE ER 500 MG PO TB12: 500 mg | ORAL | @ 04:00:00 | Stop: 2023-08-30

## 2023-08-28 MED ADMIN — PANCRELIPASE (LIP-PROT-AMYL) 36000-114000 UNITS PO CPEP: 36000 [IU] | ORAL | @ 21:00:00 | Stop: 2023-08-30 | NDC 00032301613

## 2023-08-28 MED ADMIN — CHOLECALCIFEROL 25 MCG (1000 UT) PO TABS: 50 ug | ORAL | @ 16:00:00 | Stop: 2023-08-30 | NDC 48433010401

## 2023-08-28 MED ADMIN — CLOPIDOGREL BISULFATE 75 MG PO TABS: 75 mg | ORAL | @ 16:00:00 | Stop: 2023-08-30

## 2023-08-28 MED ADMIN — HEPARIN SODIUM (PORCINE) 5000 UNIT/ML IJ SOLN: 5000 [IU] | SUBCUTANEOUS | @ 16:00:00 | Stop: 2023-09-26

## 2023-08-28 MED ADMIN — DULOXETINE HCL 30 MG PO CPEP: 60 mg | ORAL | @ 16:00:00 | Stop: 2023-08-30 | NDC 60687073411

## 2023-08-28 MED ADMIN — OXYCODONE HCL 5 MG PO TABS: 15 mg | ORAL | @ 12:00:00 | Stop: 2023-08-28 | NDC 68084035411

## 2023-08-28 MED ADMIN — PANCRELIPASE (LIP-PROT-AMYL) 36000-114000 UNITS PO CPEP: 36000 [IU] | ORAL | @ 16:00:00 | Stop: 2023-08-30 | NDC 00032301613

## 2023-08-28 MED ADMIN — PANTOPRAZOLE SODIUM 40 MG PO TBEC: 40 mg | ORAL | @ 16:00:00 | Stop: 2023-08-30 | NDC 60687073609

## 2023-08-28 MED ADMIN — NICOTINE 7 MG/24HR TD PT24: 7 mg | TRANSDERMAL | @ 06:00:00 | Stop: 2023-08-30 | NDC 00536589488

## 2023-08-28 MED ADMIN — HYDROMORPHONE HCL 1 MG/ML IJ SOLN: 1 mg | INTRAVENOUS | @ 01:00:00 | Stop: 2023-08-28 | NDC 00409128331

## 2023-08-28 MED ADMIN — OXYCODONE HCL 5 MG PO TABS: 10 mg | ORAL | @ 20:00:00 | Stop: 2023-08-30 | NDC 68084035411

## 2023-08-28 MED ADMIN — CHOLESTYRAMINE 4 G PO PACK: 4 g | ORAL | @ 20:00:00 | Stop: 2023-08-30 | NDC 68382052860

## 2023-08-28 MED ADMIN — PREGABALIN 25 MG PO CAPS: 25 mg | ORAL | @ 16:00:00 | Stop: 2023-08-30 | NDC 60687047311

## 2023-08-28 MED ADMIN — QUETIAPINE FUMARATE 100 MG PO TABS: 100 mg | ORAL | @ 04:00:00 | Stop: 2023-08-30 | NDC 60687034911

## 2023-08-28 MED ADMIN — HEPARIN SODIUM (PORCINE) 5000 UNIT/ML IJ SOLN: 5000 [IU] | SUBCUTANEOUS | @ 04:00:00 | Stop: 2023-08-30

## 2023-08-28 MED ADMIN — LIDOCAINE 5 % EX PTCH: 1 | TRANSDERMAL | @ 01:00:00 | Stop: 2023-08-28

## 2023-08-28 MED ADMIN — B COMPLEX PO CAPS: 1 | ORAL | @ 16:00:00 | Stop: 2023-08-30

## 2023-08-28 MED ADMIN — ATORVASTATIN CALCIUM 20 MG PO TABS: 40 mg | ORAL | @ 04:00:00 | Stop: 2023-08-30 | NDC 00904629161

## 2023-08-28 MED ADMIN — COPPER 0.2MG/ML SOLUTION: 2 mg | ORAL | @ 23:00:00 | Stop: 2023-08-30 | NDC 00409409201

## 2023-08-28 MED ADMIN — LACTATED RINGERS IV BOLUS: 1000 mL | INTRAVENOUS | @ 17:00:00 | Stop: 2023-08-28 | NDC 00338011704

## 2023-08-28 MED ADMIN — NICOTINE 7 MG/24HR TD PT24: 7 mg | TRANSDERMAL | @ 01:00:00 | Stop: 2023-08-30

## 2023-08-28 MED ADMIN — RANOLAZINE ER 500 MG PO TB12: 500 mg | ORAL | @ 16:00:00 | Stop: 2023-08-30

## 2023-08-28 MED ADMIN — POLYETHYLENE GLYCOL 3350 17 G PO PACK: 17 g | ORAL | @ 05:00:00 | Stop: 2023-08-30

## 2023-08-28 MED ADMIN — CHOLESTYRAMINE 4 G PO PACK: 4 g | ORAL | @ 04:00:00 | Stop: 2023-08-30 | NDC 27241013421

## 2023-08-28 MED ADMIN — OXYCODONE HCL 5 MG PO TABS: 15 mg | ORAL | @ 04:00:00 | Stop: 2023-08-28 | NDC 68084035411

## 2023-08-28 NOTE — Nursing Note
Patient transported to from [facility]

## 2023-08-28 NOTE — Consults
IP CM ACTIVE DISCHARGE PLANNING  Department of Care Coordination      Admit ZOXW:[id number]  Anticipated Date of Discharge:     Following UJ:[id number], Lilian Coma., MD      Today's short update     Abd pain, for pain control. Anticipate discharge to Home once medically cleared with no needs    Disposition     Home, Pending plan  Home address: [address]   [place] [address]          Multidisciplinary Team Member Plan of Care   Interdisciplinary rounds were conducted with the multidisciplinary team including the clinical social worker and nurse case manager. The patient's plan of care and discharge plan were discussed and formulated based on the patient's specific needs.                            Cindy Hazy,  08/27/2023

## 2023-08-28 NOTE — Progress Notes
[facility]   INPATIENT NOTE TEMPLATE   DIVISION OF HOSPITAL MEDICINE  PROGRESS NOTE    PATIENT: Donna Gross               MRN: [id number]   PCP: Kelle Darting, MD  ADMISSION DATE: 08/26/2023  HOSPITAL DAY: 2  DATE OF SERVICE: 08/28/2023   CC: Abdominal Pain (Acute/ chronic abd pain. Patient states she has been seen in ER many times '' but no one takes her serious''. )    Subjective & Significant Overnight Events    08/27/23: Admitted yesterday reports pain, nausea/vomiting/diarrhea that has been ongoing for the past year. She reports pain improved with current regimen but would like an answer for her condition.  9/7: Hypoxic overnight, retaining CO2 likely due to pain medication, soft BP, reports pain better with dilaudid, discussed with patient that dilaudid will be stopped and oxycodone will be decreased due to oversedation and hypoxia. Denies chest pain, shortness of breath or dizziness    Medications   Objective (click to expand/collapse)   Scheduled:  aspirin, 81 mg, Oral, Daily  atorvastatin, 40 mg, Oral, QHS  cholestyramine, 4 g, Oral, TID  clopidogrel, 75 mg, Oral, Daily  copper oral solution 0.2 mg/mL, 2 mg, Oral, Daily  diclofenac Sodium, 4 g, Topical, QID  DULoxetine, 60 mg, Oral, Daily  fluticasone-salmeterol, 1 puff, Inhalation, BID  heparin, 5,000 Units, Subcutaneous, BID  lactated ringers, 1,000 mL, Intravenous, Once  lidocaine, 3 patch, Transdermal, Q24H  nicotine patch, 7 mg, Transdermal, Daily  pancrelipase (Lip-Prot-Amyl), 36,000 units of lipase, Oral, TID w/meals  pantoprazole, 40 mg, Oral, Daily  polyethylene glycol, 17 g, Oral, BID  pregabalin, 25 mg, Oral, BID  QUEtiapine, 100 mg, Oral, BID  ranolazine, 500 mg, Oral, BID  vitamin B complex, 1 capsule, Oral, Daily  cholecalciferol, 50 mcg, Oral, Daily    Infusions:      PRN:  acetaminophen, albuterol, calcium carbonate, hyoscyamine, ipratropium-albuterol, loperamide, ondansetron injection/IVPB, oxyCODONE **OR** oxyCODONE, senna      Physical Exam     Vital Signs:   Temp:  [36.3 ?C (97.3 ?F)-36.6 ?C (97.9 ?F)] 36.5 ?C (97.7 ?F)  Heart Rate:  [70-104] 73  Resp:  [9-18] 17  BP: (83-123)/(57-88) 104/67  NBP Mean:  [59-98] 77  SpO2:  [86 %-97 %] 93 %       I&O's:   No intake/output data recorded. Oxygen Support:  Oxygen Therapy  SpO2: 93 %  O2 Device: Nasal cannula  Flow Rate (L/min): 2 L/min           Weight for the past 720 hrs (Last 4 readings):   Weight   08/26/23 2355 48.5 kg (106 lb 14.8 oz)   08/26/23 0639 49.9 kg (110 lb)     System Normal Findings Abnormal Findings   General []  Normal general appearance   []  In acute distress  [x]  Chronically ill appearing  []  Sedated           []  Non Verbal  []  Other:   HENMT/Neck [x]  Oropharynx clear and moist  []  Normal JVP []  Dry mucous membranes  []  JVP ( cm)  []  Other:   Resp [x]  CTA B/L []  Intubated                 []  On Supp O2 ()  []  Wheezing ()  []  Rales ()   []  Crackles ()  []  Other:   CV [x]  Normal rhythm/rate  []  No murmurs  []  No  lower extremity edema  []  Normal PMI   Normal pulses:   []  Radial []  Femoral  []  Pedal []   Abnormal rhythm ()  []   Murmur ()  []  S3    []  S4  Abnormal Pulses:  []  Radial ()  []  Femoral ()   []  DP ()  []  PT ()  []  Lower extremity edema       []  R ()   []  L ()  []  Bilateral ()  []  Other:   GI [x]  Soft  []  No tenderness to palpation  []  No rebound/guarding []  Tenderness ()  []  Fluid Wave  []  Hepatomegaly  Other: Some tenderness with palpation   MSK [x]  Normal strength/tone []  Other:    Skin [x]  No rash []  Other:   Neuro [x]  AO x 3  []  Normal strength  []  Normal sensation  []  Normal reflexes  []  Normal cerebellar exam []  AO x 0       []  AO x 1     []  AO x 2  Abnormal neuro exam:  []  Abnormal strength  []  Abnormal sensation  []  Abnormal reflexes  []  Abnormal cerebellar exam       Labs/Studies     Labs  Objective (click to expand/collapse)   Last CBC:   Results for orders placed or performed during the hospital encounter of 08/26/23   CBC   Result Value Ref Range White Blood Cell Count 7.06 4.16 - 9.95 x10E3/uL    Red Blood Cell Count 3.83 (L) 3.96 - 5.09 x10E6/uL    Hemoglobin 10.7 (L) 11.6 - 15.2 g/dL    Hematocrit 16.1 09.6 - 45.2 %    Mean Corpuscular Volume 91.6 79.3 - 98.6 fL    Mean Corpuscular Hemoglobin 27.9 26.4 - 33.4 pg    MCH Concentration 30.5 (L) 31.5 - 35.5 g/dL    Red Cell Distribution Width-SD 55.4 (H) 36.9 - 48.3 fL    Red Cell Distribution Width-CV 16.4 (H) 11.1 - 15.5 %    Platelet Count, Auto 345 143 - 398 x10E3/uL    Mean Platelet Volume 8.3 (L) 9.3 - 13.0 fL    Nucleated RBC%, automated 0.0 No Ref. Range %    Absolute Nucleated RBC Count 0.00 0.00 - 0.00 x10E3/uL    Neutrophil Abs (Prelim) 3.70 See Absolute Neut Ct. x10E3/uL   Differential, Automated   Result Value Ref Range    Neutrophil Percent, Auto 52.4 No Ref. Range %    Lymphocyte Percent, Auto 36.0 No Ref. Range %    Monocyte Percent, Auto 8.8 No Ref. Range %    Eosinophil Percent, Auto 2.1 No Ref. Range %    Basophil Percent, Auto 0.4 No Ref. Range %    Immature Granulocytes% 0.3 No Reference Range %    Absolute Neut Count 3.70 1.80 - 6.90 x10E3/uL    Absolute Lymphocyte Count 2.54 1.30 - 3.40 x10E3/uL    Absolute Mono Count 0.62 0.20 - 0.80 x10E3/uL    Absolute Eos Count 0.15 0.00 - 0.50 x10E3/uL    Absolute Baso Count 0.03 0.00 - 0.10 x10E3/uL    Absolute Immature Gran Count 0.02 0.00 - 0.04 x10E3/uL   CBC & Auto Differential    Narrative    The following orders were created for panel order CBC & Auto Differential.  Procedure  Abnormality         Status                     ---------                               -----------         ------                     WNI[[id number]]                          Abnormal            Final result               Differential, Automated[[id number]]                          Final result                 Please view results for these tests on the individual orders.      Results for orders placed or performed during the hospital encounter of 05/12/23   CBC   Result Value Ref Range    White Blood Cell Count 5.37 4.16 - 9.95 x10E3/uL    Red Blood Cell Count 3.54 (L) 3.96 - 5.09 x10E6/uL    Hemoglobin 9.3 (L) 11.6 - 15.2 g/dL    Hematocrit 38.1 (L) 34.9 - 45.2 %    Mean Corpuscular Volume 86.2 79.3 - 98.6 fL    Mean Corpuscular Hemoglobin 26.3 (L) 26.4 - 33.4 pg    MCH Concentration 30.5 (L) 31.5 - 35.5 g/dL    Red Cell Distribution Width-SD 50.9 (H) 36.9 - 48.3 fL    Red Cell Distribution Width-CV 16.1 (H) 11.1 - 15.5 %    Platelet Count, Auto 382 143 - 398 x10E3/uL    Mean Platelet Volume 8.5 (L) 9.3 - 13.0 fL    Nucleated RBC%, automated 0.0 No Ref. Range %    Absolute Nucleated RBC Count 0.00 0.00 - 0.00 x10E3/uL     Sodium   Date Value Ref Range Status   08/28/2023 136 135 - 146 mmol/L Final     Potassium   Date Value Ref Range Status   08/28/2023 5.5 (H) 3.6 - 5.3 mmol/L Final     Chloride   Date Value Ref Range Status   08/28/2023 102 96 - 106 mmol/L Final     Total CO2   Date Value Ref Range Status   08/28/2023 23 20 - 30 mmol/L Final     Urea Nitrogen   Date Value Ref Range Status   08/28/2023 25 (H) 7 - 22 mg/dL Final     Creatinine   Date Value Ref Range Status   08/28/2023 1.31 (H) 0.60 - 1.30 mg/dL Final     Calcium   Date Value Ref Range Status   08/28/2023 9.6 8.6 - 10.4 mg/dL Final     Glucose   Date Value Ref Range Status   08/28/2023 78 65 - 99 mg/dL Final           Micro  Objective (click to expand/collapse)   No results found for this or any previous visit (from the past 168 hour(s)).       Radiology  CXR 08/27/2013: CXR per review no aspiration, effusion read pending  Independent Review of Studies  Prior CT     I reviewed these notes that directly relate to the patient's acute and/or chronic medical problems with documentation of the salient findings if any:  Inpatient notes:   Last Gastroenterology Note    No notes exist for this encounter.        and      Last Hospitalist Note           08/26/23 1931 H&P addendum by Lavenia Atlas., MD                 A&P   Trudy Dosen is a 57 y.o. female pmhx anxiety, depression, emphysema, fibromyalgia, GERD, multiple abdominal surgeries including partial gastrectomy with gastrojejunostomy c/b adhesions, multiple hospitalizations for intractable abdominal pain due to SMA stenosis s/p recent SMA stenting (04/29/2023) here for intractable chronic abdominal pain admitted for acute on chronic abdominal pain    # Acute Hypoxemic respiratory failure, npoa  # Respiratory acidosis, npoa  VBG with acidosis, will need to be placed on BIPAP, discussed with patient unhappy with stopping dilaudid.  - Start BIPAP  - Repeat VBG, BMP  - CXR  - D/c dilaudid decrease oxycodone 5/10 with holding parameters  - Would avoid additional IV pain medication    # AKI, npoa  Likely pre-renal  - LR 1L x 1  - Repeat lab as above  - Renal lytes if not improving    #Acute on chronic abdominal pain, POA  #SMA stenosis, chronic, POA  #celiac stenosis, POA  #CT evidence of CAD, POA  #s/p angio and stenting of SMA of 04/29/23  #multiple abdominal surgeries c/b adhesions, chronic, POA  #GERD, chronic, POA  Pt with long-standing history of abdominal pain for about 1 year. Has been admitted several times previously with similar complaints.   Last CTAP 8/20 when patient presented to ED for similar complaints, was not admitted at that time, CT showing no acute abdominal pathology.  Some atherosclerosis with patent mesenteric vasculature and a patient SMA stent.    Etiology: Still undifferentiated, possible stent complication however unlikely given recent CTAP with patent stent vs dysmotility from opioid use. Considered diabetic thoracic polyradiculopathy though no history of diabetes (A1C 5.4% on admission) and no evidence of neuropathy. Overall most likely multifactorial from history of extensive abdominal surgeries, GERD, fibromyalgia, opioid dependence, pancreatic insufficiency, etc. Seen by GI on prior admission recommended for outpatient colonoscopy  - Scheduled for GI as outpatient Sept 27th will defer scope for outpatient  - pain regimen:        - Change to standing tylenol 1 g q8hr        - pregabalin 25 mg BID       - home duloxetine 60 mg qday       - continue home ranolazine 500 mg BID        - topicals: lidocaine patch, analgesic balm       -  Decreased as above oxy pain 5/10  - continue home pantoprazole 40 mg daily  - Declined Chad  - continue atorvastatin 40 mg at bedtime  - SMA stenting: continue home aspirin 81 mg daily and plavix 75 mg daily     #Diarrhea, chronic, intermittent, POA  #Constipation, chronic, intermittent, POA  #Nausea/vomiting, chronic, POA  #Hx of pancreatic insufficiency, chronic, POA  Pt with chronic n/v/d for the past year. Has had negative work-up including c. Diff, celiac panel, H.pylori testing, parasitic enteric panel. Pt was unable to  tolerate colonoscopy and had negative EGD.   - bowel regimen: miralax BID, senna PRN, bisacodyl suppository PRN  - continue home creon 36,000 units of lipase TID  - nausea: zofran 4 mg q8hrs PRN     #Poor PO intake, chronic, POA  #Acute weight loss, acute on chronic POA  #Severe muscle loss with cachexia due to severe malnutrition, POA  Previously evaluated by nutrition who had recommended boost TID for assistance with malnutrition likely 2/2 poor PO intake from abdominal pain. Reports weight loss.   BMI 18.88  - nutrition consulted, appreciate recs  - boost TID  - F/u Vitamin d, vitamin c, B12, thiamine, zinc  - Replete copper, vitamin d, vitamin b12 per nutrition    #Normocytic anemia, chronic, POA baseline 10-11  Pt presents with hemoglobin 11.5  Baseline around 9-10. Anemia though to be secondary to nutritional deficiencies from poor PO intake.Ferritin (15), Iron (35), TIBC (356)  -Hgb dropping no active bleeding likely due to fluid, no active bleeding   - CTM cbc    Chronic/stable conditions  #Emphysema, POA  #Tobacco use, POA  Previous PPD smoker from age 59 and reduced over last 7 years to 1 cigarette daily.   - nicotine patch 7 mg   - fluticasone-salmeterol inhaler 1 puff BID, albuterol PRN  - counseled on nicotine cessation      #anxiety, chronic, POA   #major depression, chronic, POA  #fibromyalgia, chronic, POA  #insomnia, chronic, POA   All stable. Denies SI/HI.   - continue home seroquel 150 mg BID   - continue home duloxetine 60 mg daily           # IP Checklist  - Diet: Diet regular  Oral nutrition supplements Breakfast, Lunch, Dinner; Boost Max Vanilla  - VTE PPX: SCDs  - LDA:   Peripheral IV 22 G Right;Other (Comment) Arm (2)    Advance Care Planning:   Full Code,  Primary Emergency Contact: [name],[name], Mobile Phone: [phone]    Disposition:  Expected post-hospitalization disposition will be to Home.    Plan discussed with attending Dr Dierdre Searles    Author:    Tennis Ship L. Gizaw  08/28/2023 9:39 AM        Attending Addendum    Acute hypoxemic/hypercarbic respiratory failure overnight, likely 2/2 opioid-induced oversedation. Did not receive narcan overnight, and at baseline mental status this morning so narcan deferred. Briefly on BiPAP this morning - now transitioned back to room air with stable VBG. Plan to rapidly dose-reduce oxycodone to home regimen, and defer additional IV opioids - discussed extensively with patient regarding the severity of overnight events and the contraindication to further IV opioids.    I agree with the nurse practitioner's findings and plan of care as documented above. This patient has a high probability of sudden, clinically significant deterioration, which requires the highest level of physician preparedness to intervene urgently. Critical care services were provided for the issues discussed in my note, and in particular management of acute respiratory failure. Total critical care time was 60 minutes.    Marciano Sequin. Dierdre Searles, MD  [facility]  08/28/2023 2:57 PM

## 2023-08-28 NOTE — Other
Patient's Clinical Goal:   Clinical Goal(s) for the Shift: Stable VS, sleep, safety  Identify possible barriers to advancing the care plan: Chronic pain  Stability of the patient: Moderately Stable - low risk of patient condition declining or worsening   Progression of Patient's Clinical Goal: Ox4, BMAT 4. Slept most of the night after getting medicated w/ Oxy x 2 and Dilaudid x 1. Low RR and O2 sat while asleep, got orders from MD for O2, CO2 monitoring, VBG.

## 2023-08-28 NOTE — Progress Notes
Patient arrived to unit. Endorsed to oncoming assigned RN. Bed in lowest locked position. Bed alarm on. Chart reviewed w oncoming RN

## 2023-08-29 LAB — CBC: RED CELL DISTRIBUTION WIDTH-CV: 15.9 — ABNORMAL HIGH (ref 11.1–15.5)

## 2023-08-29 LAB — Basic Metabolic Panel
CREATININE: 0.94 mg/dL (ref 0.60–1.30)
CREATININE: 0.94 mg/dL (ref 0.60–1.30)

## 2023-08-29 LAB — Glucose,POC
GLUCOSE,POC: 113 mg/dL — ABNORMAL HIGH (ref 65–99)
GLUCOSE,POC: 135 mg/dL — ABNORMAL HIGH (ref 65–99)

## 2023-08-29 MED ORDER — OXYCODONE HCL 5 MG PO TABS
5 mg | ORAL_TABLET | ORAL | 0 refills | Status: AC | PRN
Start: 2023-08-29 — End: ?

## 2023-08-29 MED ADMIN — DICLOFENAC SODIUM 1 % EX GEL: TOPICAL | @ 01:00:00 | Stop: 2023-08-30 | NDC 00067815203

## 2023-08-29 MED ADMIN — CHOLESTYRAMINE 4 G PO PACK: 4 g | ORAL | @ 12:00:00 | Stop: 2023-08-30 | NDC 42806026698

## 2023-08-29 MED ADMIN — FLUTICASONE-SALMETEROL 250-50 MCG/ACT IN AEPB: 1 | RESPIRATORY_TRACT | @ 05:00:00 | Stop: 2023-08-30

## 2023-08-29 MED ADMIN — HEPARIN SODIUM (PORCINE) 5000 UNIT/ML IJ SOLN: 5000 [IU] | SUBCUTANEOUS | @ 04:00:00 | Stop: 2023-08-30

## 2023-08-29 MED ADMIN — RANOLAZINE ER 500 MG PO TB12: 500 mg | ORAL | @ 04:00:00 | Stop: 2023-09-26

## 2023-08-29 MED ADMIN — CLOPIDOGREL BISULFATE 75 MG PO TABS: 75 mg | ORAL | @ 16:00:00 | Stop: 2023-08-30

## 2023-08-29 MED ADMIN — ACETAMINOPHEN 500 MG PO TABS: 1000 mg | ORAL | @ 12:00:00 | Stop: 2023-08-30 | NDC 00904673061

## 2023-08-29 MED ADMIN — POLYETHYLENE GLYCOL 3350 17 G PO PACK: 17 g | ORAL | @ 03:00:00 | Stop: 2023-08-30

## 2023-08-29 MED ADMIN — PREGABALIN 25 MG PO CAPS: 25 mg | ORAL | @ 16:00:00 | Stop: 2023-08-30 | NDC 60687047311

## 2023-08-29 MED ADMIN — PREGABALIN 25 MG PO CAPS: 25 mg | ORAL | @ 05:00:00 | Stop: 2023-08-30 | NDC 60687047311

## 2023-08-29 MED ADMIN — DULOXETINE HCL 30 MG PO CPEP: 60 mg | ORAL | @ 16:00:00 | Stop: 2023-08-30 | NDC 60687073411

## 2023-08-29 MED ADMIN — OXYCODONE HCL 5 MG PO TABS: 10 mg | ORAL | @ 21:00:00 | Stop: 2023-08-30 | NDC 68084035411

## 2023-08-29 MED ADMIN — ATORVASTATIN CALCIUM 20 MG PO TABS: 40 mg | ORAL | @ 05:00:00 | Stop: 2023-08-30 | NDC 00904629161

## 2023-08-29 MED ADMIN — CHOLESTYRAMINE 4 G PO PACK: 4 g | ORAL | @ 03:00:00 | Stop: 2023-08-30 | NDC 42806026698

## 2023-08-29 MED ADMIN — DICLOFENAC SODIUM 1 % EX GEL: TOPICAL | @ 18:00:00 | Stop: 2023-08-30

## 2023-08-29 MED ADMIN — PANCRELIPASE (LIP-PROT-AMYL) 36000-114000 UNITS PO CPEP: 36000 [IU] | ORAL | @ 16:00:00 | Stop: 2023-08-30 | NDC 00032301613

## 2023-08-29 MED ADMIN — ASPIRIN 81 MG PO CHEW: 81 mg | ORAL | @ 16:00:00 | Stop: 2023-08-30 | NDC 66553000201

## 2023-08-29 MED ADMIN — PANCRELIPASE (LIP-PROT-AMYL) 36000-114000 UNITS PO CPEP: 36000 [IU] | ORAL | @ 01:00:00 | Stop: 2023-08-30 | NDC 00032301613

## 2023-08-29 MED ADMIN — OXYCODONE HCL 5 MG PO TABS: 10 mg | ORAL | @ 01:00:00 | Stop: 2023-08-30 | NDC 68084035411

## 2023-08-29 MED ADMIN — OXYCODONE HCL 5 MG PO TABS: 10 mg | ORAL | @ 06:00:00 | Stop: 2023-08-30 | NDC 68084035411

## 2023-08-29 MED ADMIN — COPPER 0.2MG/ML SOLUTION: 2 mg | ORAL | @ 21:00:00 | Stop: 2023-08-30

## 2023-08-29 MED ADMIN — FLUTICASONE-SALMETEROL 250-50 MCG/ACT IN AEPB: 1 | RESPIRATORY_TRACT | @ 16:00:00 | Stop: 2023-08-30

## 2023-08-29 MED ADMIN — PANTOPRAZOLE SODIUM 40 MG PO TBEC: 40 mg | ORAL | @ 16:00:00 | Stop: 2023-08-30 | NDC 60687073609

## 2023-08-29 MED ADMIN — HEPARIN SODIUM (PORCINE) 5000 UNIT/ML IJ SOLN: 5000 [IU] | SUBCUTANEOUS | @ 16:00:00 | Stop: 2023-08-30

## 2023-08-29 MED ADMIN — NICOTINE 7 MG/24HR TD PT24: 7 mg | TRANSDERMAL | @ 01:00:00 | Stop: 2023-08-30

## 2023-08-29 MED ADMIN — OXYCODONE HCL 5 MG PO TABS: 10 mg | ORAL | @ 12:00:00 | Stop: 2023-08-30 | NDC 68084035411

## 2023-08-29 MED ADMIN — ACETAMINOPHEN 500 MG PO TABS: 1000 mg | ORAL | @ 03:00:00 | Stop: 2023-08-30 | NDC 00904673061

## 2023-08-29 MED ADMIN — NICOTINE 7 MG/24HR TD PT24: 7 mg | TRANSDERMAL | @ 01:00:00 | Stop: 2023-08-30 | NDC 00536589488

## 2023-08-29 MED ADMIN — ACETAMINOPHEN 500 MG PO TABS: 1000 mg | ORAL | @ 21:00:00 | Stop: 2023-08-30 | NDC 00904673061

## 2023-08-29 MED ADMIN — DICLOFENAC SODIUM 1 % EX GEL: TOPICAL | @ 05:00:00 | Stop: 2023-09-26

## 2023-08-29 MED ADMIN — RANOLAZINE ER 500 MG PO TB12: 500 mg | ORAL | @ 16:00:00 | Stop: 2023-08-30

## 2023-08-29 MED ADMIN — OXYCODONE HCL 5 MG PO TABS: 10 mg | ORAL | @ 16:00:00 | Stop: 2023-08-30 | NDC 68084035411

## 2023-08-29 MED ADMIN — CHOLECALCIFEROL 25 MCG (1000 UT) PO TABS: 50 ug | ORAL | @ 16:00:00 | Stop: 2023-08-30 | NDC 48433010401

## 2023-08-29 MED ADMIN — CHOLESTYRAMINE 4 G PO PACK: 4 g | ORAL | @ 21:00:00 | Stop: 2023-08-30

## 2023-08-29 MED ADMIN — B COMPLEX PO CAPS: 1 | ORAL | @ 16:00:00 | Stop: 2023-08-30 | NDC 00536137801

## 2023-08-29 MED ADMIN — LACTATED RINGERS IV BOLUS: 500 mL | INTRAVENOUS | @ 07:00:00 | Stop: 2023-08-29 | NDC 00338011704

## 2023-08-29 MED ADMIN — QUETIAPINE FUMARATE 100 MG PO TABS: 100 mg | ORAL | @ 05:00:00 | Stop: 2023-08-30 | NDC 60687034911

## 2023-08-29 MED ADMIN — DICLOFENAC SODIUM 1 % EX GEL: TOPICAL | @ 12:00:00 | Stop: 2023-08-30

## 2023-08-29 MED ADMIN — QUETIAPINE FUMARATE 100 MG PO TABS: 100 mg | ORAL | @ 16:00:00 | Stop: 2023-08-30 | NDC 60687034911

## 2023-08-29 MED ADMIN — POLYETHYLENE GLYCOL 3350 17 G PO PACK: 17 g | ORAL | @ 16:00:00 | Stop: 2023-08-30

## 2023-08-29 MED ADMIN — PANCRELIPASE (LIP-PROT-AMYL) 36000-114000 UNITS PO CPEP: 36000 [IU] | ORAL | @ 21:00:00 | Stop: 2023-08-30

## 2023-08-29 MED ADMIN — LIDOCAINE 5 % EX PTCH: 3 | TRANSDERMAL | @ 16:00:00 | Stop: 2023-08-30

## 2023-08-29 NOTE — Nursing Note
AVS discussed with patient, verbalized understanding. PIV removed. All needs met. Prescription sent to [facility] pharmacy. SW arranging transportation, taxi voucher given to discharge lounge. Meds delivered. Belongings with patient.

## 2023-08-29 NOTE — Other
Patient's Clinical Goal:   Clinical Goal(s) for the Shift: VSS, safety, comfort  Identify possible barriers to advancing the care plan:   Stability of the patient: Moderately Stable - low risk of patient condition declining or worsening   Progression of Patient's Clinical Goal:     Neuro: alert and oriented x 4  Resp: 2L NC   Cardiac: NSR   GI/GU: voiding. LBM 9/6  BMAT 4   Skin intact   Pain managed with PRN oxycodone, pt refused CPAP at night.   Safety maintained, bed in low position, call light in reach     POC: pain mgmt.

## 2023-08-29 NOTE — Discharge Summary
Discharge Summary   Name: Donna Gross MRN: [id number] DOB: 1966/10/25   Admit Date: 08/26/2023 D/C Date:   08/29/2023 LOS: 3 days   Admit Attending: Lavenia Atlas, MD Discharge Attending: Laurey Arrow., MD    PCP: Kelle Darting, MD Discharge Provider:  Lennie Odor, NP     Inpatient Care Team/ Consults:    Hospitalist Team C   Outpatient Care Team  No care team member to display     Admission Diagnosis:    #Acute worsening of chronic abdominal pain Discharge Diagnosis:    # Acute Hypoxemic respiratory failure, npoa  # Respiratory acidosis, npoa  # AKI, npoa  #Acute on chronic abdominal pain, POA  #SMA stenosis, chronic, POA  #celiac stenosis, POA  #CT evidence of CAD, POA  #s/p angio and stenting of SMA of 04/29/23  #multiple abdominal surgeries c/b adhesions, chronic, POA  #GERD, chronic, POA  #Diarrhea, chronic, intermittent, POA  #Constipation, chronic, intermittent, POA  #Nausea/vomiting, chronic, POA  #Hx of pancreatic insufficiency, chronic, POA  #Poor PO intake, chronic, POA  #Acute weight loss, acute on chronic POA  #Severe muscle loss with cachexia due to severe malnutrition, POA  #Normocytic anemia, chronic, POA baseline 10-11    Disposition:  Home or Self care      Discharge Condition:  stable       HPI:   As per H&P, Donna Gross is a 57 y.o. female with a history of intestinal stoma prolapse, duodenal ulcer, peptic ulcer disease, hypotension, colitis, superior mesenteric artery stenosis, depression, emphysema, gastritis, GERD, pancreatitis, S/P laparoscopic cholecystectomy/extensive adhesiolysis (03/24/22), and S/P stent placement (04/29/23), presenting with acute worsening of chronic abdominal pain.  She first started to experience abdominal pain 1 year ago, and since her lap chole/adhesiolysis and stent placement, has been experiencing intermittent severe abdominal pain.  The pain worsens with PO intake, and is associated with nausea and vomiting occasionally.  She endorses unintentional weight loss of about 50+ lbs during this time also.  She takes percocet at home but is running out.  Denies fever, SOB, dysuria, diarrhea, constipation, hematochezia, or melena.  Further denies recent travel, sick contacts, or URI type symptoms.       EMERGENCY DEPARTMENT COURSE:              ED Triage Vitals   Temp Temp Source BP Heart Rate Resp SpO2 O2 Device Pain Score Weight   08/26/23 0642 08/26/23 0642 08/26/23 4540 08/26/23 9811 08/26/23 0642 08/26/23 0642 08/26/23 1130 08/26/23 1814 08/26/23 0639   36.7 ?C (98.1 ?F) Oral 124/79 73 20 95 % None (Room air) Ten 49.9 kg (110 lb)        Hospital Course:    Donna Gross is a 57 y.o. female pmhx anxiety, depression, emphysema, fibromyalgia, GERD, multiple abdominal surgeries including partial gastrectomy with gastrojejunostomy c/b adhesions, multiple hospitalizations for intractable abdominal pain due to SMA stenosis s/p recent SMA stenting (04/29/2023) here for intractable chronic abdominal pain admitted for acute on chronic abdominal pain.    Patient initially placed on a multimodal pain regimen including IV dilaudid and PO oxycodone - after receiving a dose of IV dilaudid, she developed acute hypoxemic and hypercapnic respiratory failure briefly requiring BiPAP, raising concern for opioid-induced respiratory depression and hypoventilation. IV opioids were discontinued and the patient was extensively counseled that she had developed a relative contraindication to IV opioid medication and this would not be offered during the hospitalization.    Given the patient's clinical stability and  extensive prior inpatient work-up for her chronic abdominal pain, no further inpatient work-up was pursued - we recommend outpatient GI and chronic pain follow-up for ongoing management.    If the patient re-presents to the ER with the same chronic abdominal pain, we would recommend extremely judicious use of IV opioids given her episode of oversedation, hypoxia and hypercapnia during this hospitalization. In addition, we would not recommend further inpatient work-up for her chronic abdominal pain given the extensive prior work-up and chronicity of her symptoms, unless there are new characteristics to her pain presentation.    Outpatient Provider To-Do List  [ ]  follow up with GI, PCP        LACE+ Score: 53 (08/28/23 1601) Moderate Risk of Readmission  Better Outcomes by Optimizing Safe Transitions        This patient meets criteria for the following BOOST risk conditions which could lead to readmission. Please consider further evaluation or action on the potential barriers below:    Problems with Medications:      19 scheduled medications active at discharge  High-risk active medications: aspirin 81 mg chewable tablet [[id number]], clopidogrel 75 mg tablet [[id number]], pregabalin 25 mg capsule [[id number]]      Prior Hospitalizations:    2 prior inpatient admission(s) in past 6 months              Pending Labs   The following tests have been collected, but not yet resulted at the time of discharge.    Unresulted Labs (From admission, onward)       Start     Ordered    08/28/23 0702  Folate,RBC  [[id number]]  PROCEDURE ONCE,   Routine        References:    [place] Test Directory Information    08/28/23 0633    08/28/23 0400  Vitamin B1 (Thiamine),Wh Blood  [[id number]]  Morning draw,   Routine        References:    [place] Test Directory Information    08/27/23 1124    08/28/23 0400  Folate,RBC+serum  [[id number]]  Morning draw,   Routine         08/27/23 1124    08/28/23 0400  Vitamin C  [[id number]]  Morning draw,   Routine        References:    [place] Test Directory Information    08/27/23 1125                  The following labs have a preliminary result at the time of discharge.  Please check back if the final results have changed.  Preliminary Resulted Labs (From admission, onward)      None          Discharge Medication List  Coumadin and Opiate Risk Assessment   This patient does not have coumadin on their discharge med list  Controlled meds:   Controlled Medications       Opioid Agonists Disp Start End     oxyCODONE 10 mg tablet 10 tablet 08/11/2023 --    Sig - Route: Take 1 tablet (10 mg total) by mouth every six (6) hours as needed. Max Daily Amount: 40 mg - Oral    Earliest Fill Date: 08/11/2023           Current Discharge Medication List        CONTINUE these medications which have NOT CHANGED    Details   acetaminophen 500 mg tablet Take 2 tablets (1,000 mg total) by  mouth every six (6) hours as needed for Pain.  Qty: 60 tablet, Refills: 0      albuterol 90 mcg/act inhaler Inhale 2 puffs every six (6) hours as needed for Wheezing or Shortness of Breath.      aspirin 81 mg chewable tablet Chew 1 tablet (81 mg total) by mouth daily.  Qty: 90 tablet, Refills: 0      atorvastatin 40 mg tablet Take 1 tablet (40 mg total) by mouth at bedtime.  Qty: 90 tablet, Refills: 0      B Complex Vitamins (VITAMIN B COMPLEX) capsule Take 1 capsule by mouth daily.      calcium carbonate 500 mg chewable tablet Chew 1 tablet (500 mg total) by mouth two (2) times daily as needed Calcium Carbonate 500 mg is equivalent to 200 mg elemental Calcium.  Qty: 30 tablet, Refills: 0      Cholecalciferol (VITAMIN D3) 50 mcg (2000 units) TABS Take 1 tablet (50 mcg total) by mouth daily.      cholestyramine 4 g/dose powder Take 1 scoop (4 grams total) by mouth three (3) times daily with meals.  Qty: 378 g, Refills: 0      clobetasol 0.05% cream Apply topically three (3) times daily as needed (rash).      clopidogrel 75 mg tablet Take 1 tablet (75 mg total) by mouth daily.  Qty: 90 tablet, Refills: 0      cyanocobalamin 1000 mcg tablet Take 1 tablet (1,000 mcg total) by mouth daily.      DULoxetine 60 mg DR capsule Take 1 capsule (60 mg total) by mouth daily.  Qty: 30 capsule, Refills: 0      fluticasone-salmeterol 250-50 mcg/act diskus inhaler Inhale 1 puff two (2) times daily.      hyoscyamine 0.125 mg tablet Take 1 tablet (125 mcg total) by mouth every four (4) hours as needed for Cramping.  Qty: 60 tablet, Refills: 0      ipratropium-albuterol 20-100 mcg/act inhaler Inhale 2 puffs four (4) times daily as needed for Wheezing or Shortness of Breath.      lidocaine 5% patch Place 3 patches onto the skin daily 12 hours on 12 hours off.  Qty: 90 patch, Refills: 0      loperamide 2 mg capsule Take 1 capsule (2 mg total) by mouth four (4) times daily as needed for Diarrhea.  Qty: 30 capsule, Refills: 0      Menthol-Methyl Salicylate (ANALGESIC BALM) 10-15 % cream Apply topically four (4) times daily.  Qty: 60 g, Refills: 0      naloxone 4 mg/0.1 mL nasal spray Call 911. Administer a single spray intranasally into one nostril for opioid overdose. May repeat in 3 minutes if patient is not breathing.Rob Bunting: 2 each, Refills: 1      ondansetron 4 mg tablet Take 1 tablet (4 mg total) by mouth every six (6) hours as needed for Nausea or Vomiting.  Qty: 30 tablet, Refills: 0      oxyCODONE 10 mg tablet Take 1 tablet (10 mg total) by mouth every six (6) hours as needed. Max Daily Amount: 40 mg  Qty: 10 tablet, Refills: 0      pancrelipase, Lip-Prot-Amyl, (CREON) 36000 units DR capsule Take 1 capsule (36,000 units of lipase total) by mouth three (3) times daily with meals.  Qty: 90 capsule, Refills: 0      pantoprazole 40 mg DR tablet Take 1 tablet (40 mg total) by mouth daily.  Qty:  30 tablet, Refills: 0      Polyethylene Glycol 3350 (PEG 3350) 17 GM/SCOOP POWD Mix 1 capful (17 grams) in 6 to 8 ounces of liquid and drink by mouth two (2) times daily.  Qty: 510 g, Refills: 0      pregabalin 25 mg capsule Take 1 capsule (25 mg total) by mouth two (2) times daily. Max Daily Amount: 50 mg  Qty: 60 capsule, Refills: 0      QUEtiapine 100 mg tablet Take 1 tablet (100 mg total) by mouth two (2) times daily.  Qty: 60 tablet, Refills: 0      ranolazine 500 mg 12 hr tablet Take 1 tablet (500 mg total) by mouth two (2) times daily.      senna 8.6 mg tablet Take 1 tablet by mouth at bedtime as needed for Constipation.  Qty: 60 tablet, Refills: 0      sodium sulfate-potassium sulfate-magnesium sulfate solution At 6pm the evening before colonoscopy, drink 16oz x1 dose, then drink 32oz water over 1 hour. At 5am the day of colonoscopy, repeat above.  Qty: 354 mL, Refills: 0            Requested Appointments  There are no active discharge follow-up orders for this encounter.     Scheduled Appointments  No future appointments.       Discharge Physical Exam/Labs/Imaging/Procedures     Discharge Physical Exam:   BP 94/64  ~ Pulse 74  ~ Temp 36.4 ?C (97.6 ?F) (Oral)  ~ Resp 17  ~ Wt 48.5 kg (106 lb 14.8 oz)  ~ SpO2 99%  ~ BMI 18.35 kg/m?   General appearance: alert, appears stated age, and cooperative  Neck: no adenopathy, no carotid bruit, no JVD, supple, symmetrical, trachea midline, and thyroid not enlarged, symmetric, no tenderness/mass/nodules  Lungs: clear to auscultation bilaterally  Heart: regular rate and rhythm, S1, S2 normal, no murmur, click, rub or gallop  Abdomen: soft, non-tender; bowel sounds normal; no masses,  no organomegaly  Extremities: extremities normal, atraumatic, no cyanosis or edema  Skin: Skin color, texture, turgor normal. No rashes or lesions  Neurologic: Grossly normal     Relevant labs:       CBC:  Lab Results   Component Value Date    WBC 5.85 08/28/2023    HGB 9.9 (L) 08/28/2023    HCT 32.6 (L) 08/28/2023    PLT 301 08/28/2023     LFT:  Lab Results   Component Value Date    TOTPRO 6.4 08/27/2023    ALBUMIN 4.1 08/27/2023    BILITOT <0.2 08/27/2023    ALKPHOS 71 08/27/2023    AST 17 08/27/2023    ALT 8 08/27/2023       BMP:  Lab Results   Component Value Date    NA 136 08/28/2023    K 5.1 08/28/2023    CL 103 08/28/2023    CO2 24 08/28/2023    BUN 20 08/28/2023    CREAT 1.10 08/28/2023    CALCIUM 9.0 08/28/2023    GLUCOSE 88 08/28/2023       Cal/iCal/MG/Ph:  Lab Results   Component Value Date    CALCIUM 9.0 08/28/2023    MG 2.2 (H) 08/28/2023 PHOS 4.1 08/27/2023     Coags:  No results found for: ''PT'', ''APTT'', ''INR''               Relevant Imaging Studies (most recent results only):   CT abd+pelvis angiogram wo+w contrast  Result Date: 08/10/2023  CT ABD+PELVIS ANGIOGRAM WO+W CONTRAST CLINICAL HISTORY: abd pain, hx SMA stenting and multiple abd surgeries. COMPARISON: 05/12/2023 TECHNIQUE: On a multirow-detector CT scanner, a volumetric pre- and post-contrast scan was performed through the abdomen and pelvis in both the arterial and venous phases. Post processed CTA MIP reformatted images were provided. CONTRAST: iohexol (Omnipaque) 350 mg/mL inj 100 mL RADIATION DOSE: The patient received the following exposure event(s) during this study, and the dose reference values for each are as shown (CTDIvol in mGy, DLP in mGy-cm). Note that the values are not patient dose but numbers generated from scan acquisition factors based on 32 cm (L) and/or 16 cm (S) phantoms and may substantially under-estimate or over-estimate actual patient dose based on patient size and other factors. PreMonitoring, CTDI(L): 0.8, DLP: 0.8;Monitoring, CTDI(L): 3.8, DLP: 3.8;Abd/Pel wo, CTDI(L): 4.9, DLP: 130.5;Arterial, CTDI(L): 7.8, DLP: 363;Venous, CTDI(L): 7.8, DLP: 366.5 FINDINGS: VASCULAR FINDINGS: Active extravasation: None. Hematoma: None. Atherosclerotic calcifications: Moderate to severe. Aorta: No aneurysm or dissection. Celiac artery: Patent with mild narrowing at the origin, unchanged. SMA: Patent with proximal stent in place. IMA: Patent. Renal arteries: Patent. Moderate calcified plaque at the origin of the left renal artery with mild associated narrowing, unchanged. SMV: Patent. IMV: Patent. IVC: Patent. ADDITIONAL FINDINGS: Lower chest: Emphysematous changes within the lung bases. Liver: Unremarkable. Gallbladder and bile ducts: Surgically absent gallbladder with mildly dilated bile ducts, likely related to postcholecystectomy state. Spleen: Unremarkable. Pancreas: Mildly atrophic. Adrenals: Unremarkable. Kidneys and ureters: Punctate left upper pole renal stone. No hydronephrosis. Bowel: Partial gastrectomy with gastrojejunostomy. No small bowel dilatation or wall thickening. Previously seen mild colonic wall thickening has essentially resolved. Bladder: Partially distended. Reproductive organs: Unremarkable uterus and adnexa. Lymph nodes: No lymphadenopathy. Peritoneum: No free air, free fluid, or fluid collections. Paucity of intraperitoneal fat. Abdominal wall: Unremarkable. Bones: Lumbosacral degenerative disc disease with lower facet arthropathy and L5/S1 anterolisthesis, unchanged.     IMPRESSION: No evidence of acute abdominopelvic pathology. Atherosclerosis with patent mesenteric vasculature. Patent superior mesenteric artery stent. Signed by: Quentin Cornwall   08/10/2023 9:10 AM       Procedures & Operations Performed:   None         Nutrition Recommendations & Malnutrition Assessment   I have seen and examined the patient and agree with the RD assessment detailed below:     Patient meets criteria for: Severe protein calorie malnutrition    (current weight 48.5 kg (106 lb 14.8 oz),  ;  ,  ).   See RD notes for additional details.        Level of Malnutrition: Severe protein calorie malnutrition    Discharge Diet Orders: There are no active discharge diet orders for this encounter.   Skin/Wound         Discharge Activity Orders   There are no active discharge activity orders for this encounter.   Rehab Assessment   No PT evaluation this encounter  No OT evaluation this encounter      Case Manager/Home Health Assessment   Discharge Information  Discharge Address: Home address: [address]   [place] [address] (08/27/23 1714)              Home Health Orders  There are no active discharge home health orders for this encounter.   Goals of Care Note (if new for this encounter)       08/28/2023 7:11 PM       Discussed with attending physician  Dr. Marciano Sequin. Idalia Needle, NP  08/29/2023 2:28 PM  [facility]  Pager [phone]        Attending Addendum    I agree with the nurse practitioner's findings and plan of care as documented above.     Marciano Sequin. Dierdre Searles, MD  [facility] Hospitalist Service  08/29/2023 2:35 PM

## 2023-08-29 NOTE — Progress Notes
RT placed pt on bipap, pt request mask to be removed after 2-3 mins. Pt is unable to tolerate and refused to use bipap tonight, RT education on bipap benefits but pt still refused, RN notified and aware. Placed pt back on [facility] with etco2 monitoring on.

## 2023-08-29 NOTE — Consults
CASE MANAGER ASSESSMENT      Admit QION:[id number]    Date of Initial CM Assessment: 08/29/2023    Problems: Active Problems:    * No active hospital problems. *       Past Medical History:   Diagnosis Date    Anxiety     Depression     Emphysema, unspecified (HCC/RAF)     Emphysema, unspecified (HCC/RAF)     Fibromyalgia     Gastritis     GERD (gastroesophageal reflux disease)     History of blood transfusion     Intractable nausea and vomiting 01/23/2020    Pancreatitis     Past Surgical History:   Procedure Laterality Date    ABDOMINAL SURGERY      COLON SURGERY            Primary Care Physician:Butler, Josem Kaufmann, MD  Phone:[phone]    LANGUAGE ASSISTANCE:   Language Assistance  Language Resource Used?: Patient declined    NEEDS ASSESSMENT:     Level of Function Prior to Admit: Self Care/Indep. W ADLs    Primary Living Situation: Lives w/Family     Pre-admission Living Situation: Home/Apartment       Primary Support Systems: Family members          Does the patient have a Family/Support System member participating in Discharge Planning?: No    DPOA?: No       Bathroom on Main Floor: Yes  Stairs in Home: 0     Prior Treatments / Services: None       Who is your PCP?: Charm Barges, Josem Kaufmann, MD    Do you have your Primary Care Doctor's office number?: Yes    How often do you visit your doctor?: Annual      DISCHARGE ASSESSMENT:     Projected Date of Discharge: 08/29/2023    Anticipated Complex D/C?: No    Projected Discharge to: Home    Discharge Address: [address]   [place] [address]    Projected Discharge Needs: None    Who is available to transport you upon discharge?: Taxi, Uber/Lyft Notify Child psychotherapist of Patient's Transportation Options?: Yes       SDOH     Within the past 12 months, has a lack of transportation kept you from medical appointments, meetings, work, or from getting things needed for daily living? : No  How hard is it for you to pay for the very basics like food, housing, medical care, and heating?: Not very hard  How hard is it for you to pay for prescriptions or medical bills?: Not very hard  In the past 12 months has the electric, gas, oil, or water company threatened to shut off services in your home?: No        In the last 12 months, was there a time when you did not have a steady place to sleep or slept in a shelter (including now)?: No                                [employer],  08/29/2023

## 2023-08-29 NOTE — Other
Patient's Clinical Goal:   Clinical Goal(s) for the Shift: VSS, safety, comfort  Identify possible barriers to advancing the care plan:   Stability of the patient: Moderately Stable - low risk of patient condition declining or worsening   Progression of Patient's Clinical Goal:     Neuro: alert and oriented x 4  Resp: 2L NC   Cardiac: NSR   GI/GU: voiding. LBM 9/6  BMAT 4   Skin intact   Pain managed with PRN oxycodone, pt on Bipap in am per MD order   Safety maintained, bed in low position, call light in reach

## 2023-08-29 NOTE — Consults
FINAL DISCHARGE MULTIDISCIPLINARY NOTE  Department of Care Coordination      Admit ZOXW:[id number]  Anticipated Date of Discharge: 08/29/2023    Following MD:Li, Marciano Sequin., MD    Home [address]  [place] [address]      DISCHARGE INFORMATION:     Discharge Address: [address]   [place] [address]    Individual(s) notified of discharge plan:  Contact Name: Donna Gross Relationship: Self   Contact Number(s): [phone]      Is patient/family informed of discharge?: Yes Is patient/family agreeable of discharge destination?: Yes     Medicare Important Message Provided: Not Applicable         Final Discharge Needs: No Needs     Freedom of Choice   Freedom of Choice/Patient Choice  Treatment Preferences: Addressed  Discharge Goals: Addressed    Mudlogger (if applicable):    Appreciate SW assistance.               Dorothyann Peng,  08/29/2023

## 2023-08-31 LAB — FOLATE,RBC: FOLATE, RBC: 482 ng/mL (ref >=366)

## 2023-08-31 LAB — Vitamin C: VITAMIN C: 76 umol/L (ref 23–114)

## 2023-09-01 ENCOUNTER — Telehealth: Payer: PRIVATE HEALTH INSURANCE

## 2023-09-01 LAB — Vitamin B1 (Thiamine),Wh Blood: VITAMIN B1 (THIAMINE),WH BLOOD: 154 nmol/L (ref 70–180)

## 2023-09-01 NOTE — Telephone Encounter
PT [facility] MRN [id number]    Discharge Followup Appointment Info      Date of Discharge: 08/29/2023    Discharge Disposition: Home or Self Care   TIme Frame: Within 7 Days    Sched with PCP?: Outside PCP PCP Name: Kelle Darting, MD   Was Appt Confirmed?: Neg    Barriers to Scheduling: Reason not listed (please free text below), Clinic scheduling protocol List Other Barriers, if applicable: Per [facility] call center at outside PCP, he is unable to reach FD so he will send an accommodation. Stated he will have clinic contact pt directly for an appt. Pt has been informed.   Did you schedule any Specialty visits?: No    Additional Appts/Tests/Info         Patient has [facility] assigned to [facility]    Auth request has been submitted to [facility] for pt to see [facility]    Called pt at [phone], pt has been advised. Will follow up.  Paged discharging MD per pt's request - experiencing pain & swelling still

## 2023-09-09 NOTE — Telephone Encounter
Donna Gross MRN [id number]     Discharge Followup Appointment Info      Date of Discharge: 08/29/2023    Discharge Disposition: Home or Self Care   TIme Frame: Within 7 Days    Sched with PCP?: Outside PCP PCP Name: Kelle Darting, MD   Was Appt Confirmed?: Neg    Barriers to Scheduling: Reason not listed (please free text below), Clinic scheduling protocol List Other Barriers, if applicable: Per [facility] call center at outside PCP, he is unable to reach FD so he will send an accommodation. Stated he will have clinic contact pt directly for an appt. Pt has been informed.   Did you schedule any Specialty visits?: Yes    Additional Appts/Tests/Info Future Appointments  09/28/2023  1:00 PM    Donna Lund., MD   [facility]         Donna Gross        Received approval from pt's insurance to f/u with Dr. Janeal Holmes pt at [phone], left message requesting a call back    Will mail reminder letter

## 2023-09-10 ENCOUNTER — Ambulatory Visit: Payer: PRIVATE HEALTH INSURANCE

## 2023-09-10 ENCOUNTER — Inpatient Hospital Stay
Admit: 2023-09-10 | Discharge: 2023-09-10 | Disposition: A | Discharge status: Home / Self Care | Payer: PRIVATE HEALTH INSURANCE | Source: Home / Self Care

## 2023-09-10 DIAGNOSIS — R109 Unspecified abdominal pain: Secondary | ICD-10-CM

## 2023-09-10 DIAGNOSIS — R112 Nausea with vomiting, unspecified: Secondary | ICD-10-CM

## 2023-09-10 LAB — UA,Microscopic: WBCS: 10 {cells}/uL (ref 0–22)

## 2023-09-10 LAB — Albumin: ALBUMIN: 4.2 g/dL (ref 3.9–5.0)

## 2023-09-10 LAB — CBC: RED BLOOD CELL COUNT: 3.6 x10E6/uL — ABNORMAL LOW (ref 3.96–5.09)

## 2023-09-10 LAB — UA,Dipstick,POC

## 2023-09-10 LAB — Alkaline Phosphatase: ALKALINE PHOSPHATASE: 68 U/L (ref 37–113)

## 2023-09-10 LAB — Bilirubin,Total: BILIRUBIN,TOTAL: <0.2 mg/dL (ref 0.1–1.2)

## 2023-09-10 LAB — Prothrombin Time Panel: INR: 1 s (ref 11.5–14.4)

## 2023-09-10 LAB — Extra Light Green Top

## 2023-09-10 LAB — Basic Metabolic Panel
ANION GAP: 12 mmol/L (ref 8–19)
CREATININE: 0.95 mg/dL (ref 0.60–1.30)

## 2023-09-10 LAB — Lipase: LIPASE: 22 U/L (ref 13–69)

## 2023-09-10 LAB — Amylase: AMYLASE: 105 U/L (ref 31–124)

## 2023-09-10 LAB — Alanine Aminotransferase: ALANINE AMINOTRANSFERASE: 5 U/L — ABNORMAL LOW (ref 8–70)

## 2023-09-10 LAB — Aspartate Aminotransferase: ASPARTATE AMINOTRANSFERASE: 18 U/L (ref 13–62)

## 2023-09-10 LAB — APTT: APTT: 30.9 s (ref 24.4–36.2)

## 2023-09-10 LAB — UA,Dipstick

## 2023-09-10 LAB — Differential Automated: ABSOLUTE BASO COUNT: 0.03 10*3/uL (ref 0.00–0.10)

## 2023-09-10 LAB — Bilirubin,Conjugated: BILIRUBIN,CONJUGATED: <0.2 mg/dL (ref <=0.3)

## 2023-09-10 MED ADMIN — IOHEXOL 350 MG/ML IV SOLN: 100 mL | INTRAVENOUS | @ 17:00:00 | Stop: 2023-09-10 | NDC 00407141491

## 2023-09-10 MED ADMIN — FENTANYL CITRATE (PF) 100 MCG/2ML IJ SOLN: 25 ug | INTRAVENOUS | @ 15:00:00 | Stop: 2023-09-10

## 2023-09-10 MED ADMIN — HYDROMORPHONE HCL 1 MG/ML IJ SOLN: .5 mg | INTRAVENOUS | @ 17:00:00 | Stop: 2023-09-10 | NDC 00409128331

## 2023-09-10 MED ADMIN — HYDROCODONE-ACETAMINOPHEN 5-325 MG PO TABS: 2 | ORAL | @ 21:00:00 | Stop: 2023-09-10

## 2023-09-10 MED ADMIN — HYDROMORPHONE HCL 1 MG/ML IJ SOLN: .5 mg | INTRAVENOUS | @ 15:00:00 | Stop: 2023-09-10 | NDC 00409128331

## 2023-09-10 MED ADMIN — ONDANSETRON HCL 4 MG/2ML IJ SOLN: 4 mg | INTRAVENOUS | @ 15:00:00 | Stop: 2023-09-10 | NDC 60505613000

## 2023-09-10 MED ADMIN — OXYCODONE-ACETAMINOPHEN 5-325 MG PO TABS: 1 | ORAL | @ 21:00:00 | Stop: 2023-09-11 | NDC 00904709361

## 2023-09-10 MED ADMIN — DROPERIDOL 2.5 MG/ML IJ SOLN: .625 mg | INTRAVENOUS | @ 15:00:00 | Stop: 2023-09-11

## 2023-09-10 NOTE — ED Notes
Dr. Tia Masker made aware that patient would like to speak with him for an update.

## 2023-09-10 NOTE — ED Notes
Md aware pt request to talk with him   still c/o pain

## 2023-09-10 NOTE — ED Notes
[facility] NOTIFICATION 09/10/2023 06:34 Donna Gross, Donna Gross DOB: 09/14/1966 MRN: [id number]    [facility] Donna Gross's patient encounter information:   Donna Gross:?[id number]  Account [id number]  Billing Account [id number]      Criteria Met      2 Visits in 30 Days    6 Visits in 180 Days    History of Sepsis Dx    Security and Safety  No Security Events were found.  ED Care Guidelines  There are currently no ED Care Guidelines for this patient. Please check your facility's medical records system.    Flags      History of Sepsis - Patient has received a diagnosis of Sepsis from an acute or post-acute setting. Apply appropriate clinical planning practices; to learn more visit [email or web address] / Attributed By: [employer] / Attributed On: 05/20/2023       [facility] enrolled with [facility] - Patient is currently enrolled in the Medi-Cal Enhanced Care Management Program with [facility]. Please reach out between the hours of 08:00 am and 05:00 pm Monday-Friday at [phone] to coordinate care. / Attributed By: [facility] [employer] / Attributed On: 09/03/2023       Prescription Drug Data  No Prescription Drug Data was found.    E.D. Visit Count (12 mo.)  Facility Visits   [facility] 3   [facility] 2   [name] 1   Total 6   Note: Visits indicate total known visits.     Recent Emergency Department Visit Summary  Date Facility [facility] Type Diagnoses or Chief Complaint    Sep 10, 2023  [facility].  [place]     Aug 26, 2023  [facility].  [place]      1. Nausea with vomiting, unspecified      1. Unspecified abdominal pain      1. Abdominal Pain      3. Other chronic pain      4. Abnormal weight loss      5. Adult failure to thrive      Aug 10, 2023  [facility].  [place]gency      1. Unspecified abdominal pain      1. Abdominal Pain      2. Abnormal weight loss      3. Diarrhea, unspecified      May 22, 2023 Donna Gross  [place]  Emergency  Chief Complaint: CP/ABD PAIN    May 21, 2023  [facility]  Don[place] Gross.  [place]gency      Dysphagia, unspecified      Chronic obstructive pulmonary disease, unspecified      Gastritis, unspecified, without bleeding      Heart failure, unspecified      Other long term (current) drug therapy      Major depressive disorder, single episode, unspecified      Acquired absence of other specified parts of digestive tract      Anxiety disorder, unspecified      Weakness      Anemia, unspecified      May 19, 2023  [facility]  [place] Gross.  [place]  Emergency  Chief Complaint: SOB      Recent Inpatient Visit Summary  Date Facility [facility] Type Diagnoses or Chief Complaint    Aug 28, 2023  [facility]  [place]      1. Abnormal weight loss      2. Nicotine dependence, unspecified, uncomplicated      3. Unspecified abdominal pain      4. Other chronic pain      5. Adult failure to thrive      6. Nausea with vomiting, unspecified      May 22, 2023  Donna Gross  [place]  Medical Surgical  Chief Complaint: Colitis    May 19, 2023  [facility]  Donna Gross.  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  [place]  Cath Lab      1. Generalized abdominal pain      1. Noninfective gastroenteritis and colitis, unspecified      2. Other specified postprocedural states      3. Vascular disorder of intestine, unspecified      4. Tobacco use      5. Diarrhea, unspecified      6. Nausea with vomiting, unspecified      8. Chronic vascular disorders of intestine 9. Unspecified severe protein-calorie malnutrition      10. Anemia, unspecified      Apr 23, 2023  [facility].  [place]  Family Practice      1. Noninfective gastroenteritis and colitis, unspecified      1. Generalized abdominal pain      2. Other specified postprocedural states      3. Vascular disorder of intestine, unspecified      4. Tobacco use      5. Diarrhea, unspecified      6. Nausea with vomiting, unspecified      8. Chronic vascular disorders of intestine      9. Unspecified severe protein-calorie malnutrition      10. Anemia, unspecified      Feb 02, 2023  [facility].  [place]  CT Scan      1. Dehydration      2. Generalized abdominal pain      3. Abnormal weight loss      4. Unspecified abdominal pain      5. Acute kidney failure, unspecified      6. Diarrhea, unspecified      7. Nausea with vomiting, unspecified        Care Team  [facility] Specialty Phone Fax Service Dates   PEREZ, [name], M.D. Family Medicine   Current      [facility]  This patient has registered at the [facility] Emergency Department  For more information visit: [email or web address]   PLEASE NOTE:     1.   Any care recommendations and other clinical information are provided as guidelines or for historical purposes only, and providers should exercise their own clinical judgment when providing care.    2.   You may only use  this information for purposes of treatment, payment or health care operations activities, and subject to the limitations of applicable [facility] Policies.    3.   You should consult directly with the organization that provided a care guideline or other clinical history with any questions about additional information or accuracy or completeness of information provided.    ? 2024 [facility] - [email or web address]

## 2023-09-10 NOTE — ED Notes
Pt pending dispo  aware of plan of care .Donna Gross

## 2023-09-10 NOTE — ED Notes
Pt aware to return if further concerns

## 2023-09-10 NOTE — ED Notes
Pt tolerated po fluids and crackers / cleared for d/c  per MD    refused pain med's at this time

## 2023-09-10 NOTE — ED Notes
Called CT scan for testing   .Marland Kitchen  Pt transported to dept / alert

## 2023-09-10 NOTE — ED Provider Notes
[facility]  Emergency Department Service Report    Donna Gross 57 y.o. female , presents with Abdominal Pain    Triage   Arrived on 09/10/2023 at 6:34 AM   Arrived by Walk-in [14]    ED Triage Vitals   Temp Temp Source BP Heart Rate Resp SpO2 O2 Device Pain Score Weight   09/10/23 0637 09/10/23 6433 09/10/23 2951 09/10/23 0637 09/10/23 0637 09/10/23 0637 09/10/23 1030 09/10/23 0640 09/10/23 0638   36.5 ?C (97.7 ?F) Oral 142/78 65 18 99 % None (Room air) Ten 48.7 kg (107 lb 5.8 oz)       Pre hospital care:       Allergies   Allergen Reactions    Hydrocodone Other (See Comments)     ''burns a hole in stomach''  Tolerates hydromorphone    Ibuprofen Other (See Comments)     GI discomfort    ''hurts my stomach''    Morphine Itching     Tolerates hydromorphone       History   HPI     Donna Gross is a 57 y.o. female with a history of intestinal stoma prolapse, duodenal ulcer, peptic ulcer disease, arterial hypotension, gallstones, colitis, superior mesenteric artery stenosis, anxiety, depression, emphysema, and pancreatitis, presenting with abdominal pain, onset a few days ago. Patient describes experiencing severe upper abdominal pain at a level 10/10, abdominal bloating, and diarrhea.  Has some streaks of blood on toilet paper but this is usual for her, no black material and stool is happened with her prior GI bleed. Patient also reports experiencing some associated nausea and recent unintentional weight loss. Patient states that her last bowel movement was 1 day ago and endorses passing some gas today. Patient reports having a stent placed on 04/30/23, was experiencing some melena at that time, and received an emergent blood transfusion on 05/17/23.  She wants her blood checked to make sure she does not require another transfusion. Patient denies any melena.                               Past Medical History:   Diagnosis Date    Anxiety     Depression     Emphysema, unspecified (HCC/RAF) Emphysema, unspecified (HCC/RAF)     Fibromyalgia     Gastritis     GERD (gastroesophageal reflux disease)     History of blood transfusion     Intractable nausea and vomiting 01/23/2020    Pancreatitis         Past Surgical History:   Procedure Laterality Date    ABDOMINAL SURGERY      COLON SURGERY          Past Family History   family history includes Diabetes in her brother and father; Heart disease in her mother.                 Past Social History   she reports that she has been smoking cigarettes. She uses smokeless tobacco. She reports that she does not drink alcohol and does not use drugs. No history on file for sexual activity.       Physical Exam   Physical Exam    Constitutional: no acute distress, not ill appearing. (+) Cachectic appearing.  But weight is within 1 kg of her prior  Head: no signs of acute trauma, moist mucous membranes, normal voice.   Eyes: visual acuity grossly normal.  Cardiovascular: warm and well perfused peripherally, no murmurs.   Pulmonary: effort is normal, wheezing is absent.   Abdominal: non-distended and non-tender. (+) Surgical scars.  All well healed. entirely benign abdomen  Extremities: no external signs of trauma, minimal edema.   Skin: normal color with capillary refill <2sec.   Neurological: alert and oriented, moving all extremities without gross deficits as per baseline.   Psychiatric: cooperative, answering questions appropriately.     ED Course     ED Course as of 09/10/23 1303   Fri Sep 10, 2023   1610 CT abd+pelvis w contrast  My read: no air fluid levels, possible enteritis [KI]   1123 CT abd+pelvis w contrast       Lower chest: Pleural-parenchymal scarring at the lung bases. Mild bibasilar atelectasis. Coronary artery calcifications.  Liver: Unremarkable.  Gallbladder and bile ducts: Surgically absent gallbladder with mildly dilated bile ducts, likely related to postcholecystectomy state, similar to prior.  Spleen: Unremarkable.  Pancreas: Normally enhancing with no peripancreatic stranding. Prominent pancreatic duct, unchanged.  Adrenals: Unremarkable.  Kidneys and ureters: Unremarkable.  Bowel: Postsurgical changes of partial gastrectomy and gastrojejunostomy. Nondilated bowel with no wall thickening. Normal appendix.  Bladder: Unremarkable.  Reproductive organs: Unremarkable.  Lymph nodes: Unremarkable.  Peritoneum: Unremarkable.  Vessels: Extensive atherosclerosis.  Abdominal wall: Postsurgical changes in the anterior abdominal wall. Incidental coarse calcifications in the gluteal subcutaneous soft tissues, likely dystrophic.  Bones: Multilevel degenerative changes of the lumbar spine. Unchanged degenerative L5 anterolisthesis.        IMPRESSION:  1.  No acute CT abnormality in the abdomen or pelvis.   2.  Additional findings as above are unchanged.            [KI]      ED Course User Index  [KI] Navaya Wiatrek L., MD       Laboratory Results     Labs Reviewed   BASIC METABOLIC PANEL - Abnormal; Notable for the following components:       Result Value    Chloride 108 (*)     Total CO2 18 (*)     All other components within normal limits   ALT (SGPT) - Abnormal; Notable for the following components:    Alanine Aminotransferase 5 (*)     All other components within normal limits   CBC (PERFORMABLE) - Abnormal; Notable for the following components:    Red Blood Cell Count 3.60 (*)     Hemoglobin 10.2 (*)     Hematocrit 33.2 (*)     MCH Concentration 30.7 (*)     Red Cell Distribution Width-SD 55.1 (*)     Red Cell Distribution Width-CV 16.1 (*)     Mean Platelet Volume 8.4 (*)     All other components within normal limits   UA,DIPSTICK - Abnormal; Notable for the following components:    Leukocyte Esterase 1+ (*)     All other components within normal limits   UA,DIPSTICK,POC - Abnormal; Notable for the following components:    Leukocyte Esterase Trace (*)     All other components within normal limits   AST (SGOT) - Normal   BILIRUBIN,CONJ - Normal   ALKALINE PHOSPHATASE - Normal   ALBUMIN - Normal   LIPASE - Normal   AMYLASE - Normal   BILIRUBIN,TOTAL - Normal   PROTHROMBIN TIME PANEL - Normal   APTT - Normal   UA,MICROSCOPIC - Normal   RAINBOW DRAW TO LABORATORY    Narrative:  The following orders were created for panel order [facility] Draw to Laboratory Clinton Gallant) (ED GI Bleed: Nurse Protocol).  Procedure                               Abnormality         Status                     ---------                               -----------         ------                     Extra Burna Mortimer XBJ[[id number]]                            Final result                 Please view results for these tests on the individual orders.   CBC & AUTO DIFFERENTIAL    Narrative:     The following orders were created for panel order CBC & Plt & Diff (ED GI Bleed: Nurse Protocol).  Procedure                               Abnormality         Status                     ---------                               -----------         ------                     HYQ[[id number]]                          Abnormal            Final result               Differential, Automated[[id number]]                          Final result                 Please view results for these tests on the individual orders.   EXTRA LIGHT GREEN TOP   DIFFERENTIAL, AUTOMATED (PERFORMABLE)   URINALYSIS,ROUTINE    Narrative:     The following orders were created for panel order Urinalysis,Routine.  Procedure                               Abnormality         Status                     ---------                               -----------         ------  UA,Dipstick[[id number]]                  Abnormal            Final result               UA,Microscopic[[id number]]               Normal              Final result                 Please view results for these tests on the individual orders.   POCT URINALYSIS DIPSTICK   TYPE AND SCREEN       Imaging Results     CT abd+pelvis w contrast   Final Result by Juluis Mire., MD (09/20 1024)   IMPRESSION:   1.  No acute CT abnormality in the abdomen or pelvis.    2.  Additional findings as above are unchanged.            Signed by: Juluis Mire   09/10/2023 10:24 AM          Administered Medications     Medication Administration from 09/10/2023 0634 to 09/10/2023 1303         Date/Time Order Dose Route Action Action by Comments     09/10/2023 0813 PDT droPERidol 2.5 mg/mL inj 0.625 mg 0.625 mg Intravenous Not Given Kathreen Cosier, RN --     09/10/2023 [phone] PDT fentaNYL (PF) 100 mcg/2 mL inj 25 mcg 25 mcg IV Push Not Given Kathreen Cosier, RN --     09/10/2023 0750 PDT ondansetron 4 mg/2 mL inj 4 mg 4 mg Intravenous Given GutheimKathleen Lime, RN --     09/10/2023 0751 PDT HYDROmorphone 1 mg/mL inj 0.5 mg 0.5 mg IV Push Given Kathreen Cosier, RN --     09/10/2023 0933 PDT iohexol (Omnipaque) 350 mg/mL inj 100 mL 100 mL Intravenous Given Wonda Cerise --     09/10/2023 1020 PDT HYDROmorphone 1 mg/mL inj 0.5 mg 0.5 mg IV Push Given Gutheim, Kathleen Lime, RN --            HYDROmorphone 1 mg/mL inj 0.5 mg, HYDROmorphone 1 mg/mL inj 0.5 mg is a High-risk medication as it is a parenteral controlled substance.          Procedures   Procedural Sedation  Procedures    Medical Decision Making   Donna Gross is a 57 y.o. female with a history of intestinal stoma prolapse, duodenal ulcer, peptic ulcer disease, arterial hypotension, gallstones, colitis, superior mesenteric artery stenosis, anxiety, depression, emphysema, and pancreatitis, presenting with abdominal pain, onset a few days ago.  Labs and CT scan ultimately very reassuring and did not show any acute pathology requiring immediate intervention.  She has been tolerating oral intake and in fact asked for food and was frustrated we did not bring it immediately while we are waiting for the CT scan results as I was initially concerned she might have a surgical diagnosis and need to be kept NPO.  She was dissatisfied with the doses of Dilaudid and asked specifically for dilaudid by name throughout her visit multiple times including at the very beginning of the visit.  This prompted me to have some concern about her palate patterns of opioid use and requests although she obviously has a very complex abdominal history and multiple medical problems and I discussed with her that it may simply  be too early for [place] to identify her pathology today.  However at this time I do think it is reasonable for her to go home and follow up with her doctors and return here if her symptoms worsen.  I attempted to start a carrying discussion about the patterns of her opioid use but unfortunately was quite clear she would not be receptive to this and so I elected to pursue oral opioid rather than IV analgesia at this time.      Text above summarizes the current medical decision making by Donna Burgo MD attending, but please see ED course for additional MDM and detailed timestamped updates, as well as further differential and discussion below:  Portions of this record may have been created with voice recognition software, so sound-alike and misrecognized phrases may be contained in the documentation. All dictations have been carefully reviewed to eliminate errors whenever possible, especially those that may be clinically significant.     I have considered the following diagnoses and/or treatments but in my judgement it is not in the patient's best interests to pursue them further at this time, because the likely risks exceed the likely benefits:  Sbo: passing gas, ct similar to prior, very high risk with stent  Gi bleed: hgb similar to prior, chronic process, no active bleeding      ''Thank you for your patience today and for entrusting [place] with your care.  We understand you had a frustrating visit and are unsatisfied with the type of pain control you received and the delay in bringing you food.  We apologize for any delays and we are very sorry that you had a challenging experience and were in pain. You came to the emergency department due to abdominal pain and other symptoms, some of which have been ongoing and intermittent for quite some time and had become very bothersome for you. Fortunately, at this time we think you are safe for discharge and can return to most usual activities because your testing was normal and the scan of her abdomen did not show any new changes.  Please come back right away if things are getting worse or you have any concerns, and in particular if if you are unable to walk, talk, eat, drink, or have any trouble breathing; we are always happy to re-evaluate anyone, especially if you have any new concerns. The symptoms we saw today could still change or worsen, in which case you should come back right away or call 911. In any case, it is very important that you call your doctor first thing tomorrow morning to discuss this emergency department visit and follow-up with your doctor within 24-48 hours.. Thank you again for your patience and for the privilege of contributing to your care.''    Sincerely, -Dr. Tia Gross         Medical Decision Making    Launch MDCalc MDM Tool   MDCalc MDM Module  Sep 10 2023 1:01 PM [Donna Gross]  Data:  - Discussed with external professional: Case discussed with Crisis/SW/CM. See MDM section and/or ED Course for additional details on the discussion. pharmacy  - Independent interpretation: I independently reviewed the CT Abd+Pelvis W contr IV. It showed no acute abnormality. See MDM section and/or ED Course for my interpretation.  - Test/documents/historian: 3 tests ordered ~ 3 tests reviewed ~ 3 external notes reviewed  Problems: Abdominal pain, acute  Risk: HYDROmorphone injection (Parenteral controlled substances)  Problems  Clinical Impressions  Complexity of problems addressed          Abdominal pain, acute (Primary)  Nausea and vomiting, unspecified vomiting type High:  []  Acute/chronic illness/injury with threat to life to bodily function  []  Chronic illness with severe exacerbation, progression, or side effects of treatment    Moderate:  []  Undiagnosed new problem with uncertain prognosis  []  Acute illness with systemic symptoms  []  Acute complicated injury    Data and Risk  Independent Historian []  Parent as child too young to provide hx []  Family/Caregiver due to AMS/dementia []  EMS due to medical acuity/trauma []  Family/EMS due to behavioral health concern and for collateral []    External Data Reviewed previous workup and mgmt of patient's Abdominal Pain via  []  Previous [place] Notes/Labs/Imaging []  External Notes/Labs/Imaging  which were non-contributory unless documented otherwise in HPI and ED Course   Considered but decided against  []  CT Head/C-spine given [place] CT/NEXUS/PECARN criteria []  CTA Chest to r/o PE given PERC/Well's criteria []  CT AP to r/o appendicitis given PAS score or family discussion []  Hospitalization due to *  []    Discussed w/ ext HCP []  Consults, PCP/outpt specialists, nursing home. See ED Course for details.   SDOH Affecting Dx/Tx []  Insurance limiting specialist referral []  Housing instability limiting outpt mgmt   []  Financial insecurity limiting medication access []  Substance/ETOH use []    Care de-escalation []  Shared decision-making regarding de-escalation of care (e.x. DNR) or foregoing hospitalization-level of care due to patient wishes/goals of care   Interpretations See ED Course. []  Independent interpretations of ECG or Radiology: See ED Course   If applicable, parenteral controlled substances, drug therapies requiring intensive monitoring for toxicity, and prescription drug management are documented in the the Medications section of this note. If applicable, major and minor procedures are documented separately in Procedure Notes.    Clinical Impression           Abdominal pain, acute (Primary)  Nausea and vomiting, unspecified vomiting type      Prescriptions     New Prescriptions    No medications on file       Disposition and Follow-up   Disposition:  Discharge [1]     Future Appointments   Date Time Provider Department Center   09/28/2023  1:00 PM Lala Lund., MD ANS [facility] PAIN [facility]       Follow up with:  No follow-up provider specified.    Return precautions are specified on After Visit Summary.        Scribe Signature   I, Donna Gross, have acted as a Stage manager for patient Donna Gross on behalf of Dr. Jennye Moccasin at 09/10/2023 at 7:28 AM. All documentation underwent a comprehensive review by the listed physician(s) and received their approval upon signing.              Deneise Getty L., MD  09/10/23 [phone]

## 2023-09-10 NOTE — ED Notes
Pt awake alert  c/o chronic generalized abd pain for many months    c/o dark bloody stool x 2 days   small amt per pt     medicated per orders    pending CT SCAN at this time

## 2023-09-10 NOTE — ED Notes
Dr. Tia Masker at bedside to speak with patient.

## 2023-09-10 NOTE — ED Notes
Md aware pt awaiting to talk to him about pain management

## 2023-09-10 NOTE — Discharge Instructions
''Thank you for your patience today and for entrusting [place] with your care.  We understand you had a frustrating visit and are unsatisfied with the type of pain control you received and the delay in bringing you food.  We apologize for any delays and we are very sorry that you had a challenging experience and were in pain. You came to the emergency department due to abdominal pain and other symptoms, some of which have been ongoing and intermittent for quite some time and had become very bothersome for you. Fortunately, at this time we think you are safe for discharge and can return to most usual activities because your testing was normal and the scan of her abdomen did not show any new changes.  Please come back right away if things are getting worse or you have any concerns, and in particular if if you are unable to walk, talk, eat, drink, or have any trouble breathing; we are always happy to re-evaluate anyone, especially if you have any new concerns. The symptoms we saw today could still change or worsen, in which case you should come back right away or call 911. In any case, it is very important that you call your doctor first thing tomorrow morning to discuss this emergency department visit and follow-up with your doctor within 24-48 hours.. Thank you again for your patience and for the privilege of contributing to your care.''    Sincerely, -Dr. Tia Masker            Emergency Department Discharge Instructions      Summary of your visit  You have been evaluated in the [facility] Emergency Department today for abdominal pain.  Your evaluation included a physical exam, imaging, and labs.  We have not found a serious cause of the abdominal pain at this time. However, some abdominal problems may take more time to appear. Therefore, it is important that you carefully watch for changes in the abdominal pain (such as any new symptoms or worsening of your current condition).     Follow-Up  Please follow up with your primary care physician within three days after being discharged from the ER - you can call to schedule an appointment.  You can find a primary care physician at [facility] by calling [phone].    You can also follow-up with a gastroenterologist at [facility] by calling [phone].  If you do not have a Primary Care Physician, please call your insurance company or you may call [phone] to establish care with a [facility] physician.  If you are uninsured, please call 2-1-1 to find a free or low-cost clinic in your area. 2-1-1 [place] is the central source for providing information and referrals for all health and human services in [facility] [place]. Our 2-1-1 phone line is open 24 hours, 7 days a week, with trained [employer] prepared to offer help with any situation, any time. Our community services go far beyond phone referrals - explore our website to learn more. If you are calling from outside [facility] or cannot directly dial 2-1-1, you can call [phone].      Return to the Emergency Department if you experience:  Fevers 100.4 ?F or greater  Worsening or uncontrolled pain  Persistent nausea and vomiting  Inability to tolerate food or fluids by mouth  Bloody stools or vomit  Black or tarry stools  Any other concerning symptoms     Thank you for choosing [facility] for your care. It was a pleasure  taking part in your care today, and we wish you the best!

## 2023-09-10 NOTE — ED Notes
Pt returned from CT SCAN  alert  requesting pain medication for generalized abd pain 10/10   md aware

## 2023-09-21 IMAGING — US US ABDOMEN RUQ
1 series · 14 of 25 positions shown · non-contrast
Comparison: None.

FINAL REPORT:
EXAM: US ABDOMEN RUQ
CLINICAL INDICATION: Elevation of levels of liver transaminase levels
Cholesterolosis of gallbladder.
TECHNIQUE: Axial and longitudinal images were obtained of the right upper abdomen using real-time ultrasound.

[Series 1: us abdomen ruq · 14 of 75 slices shown]
[im 1/75]
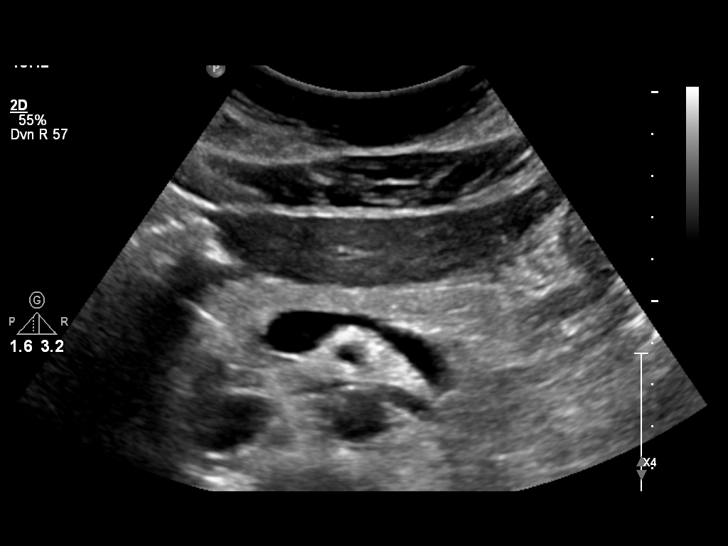
[im 7/75]
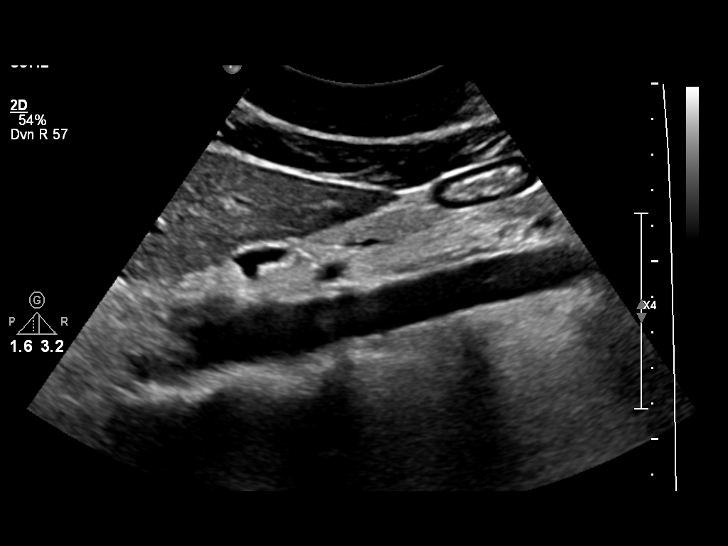
[im 13/75]
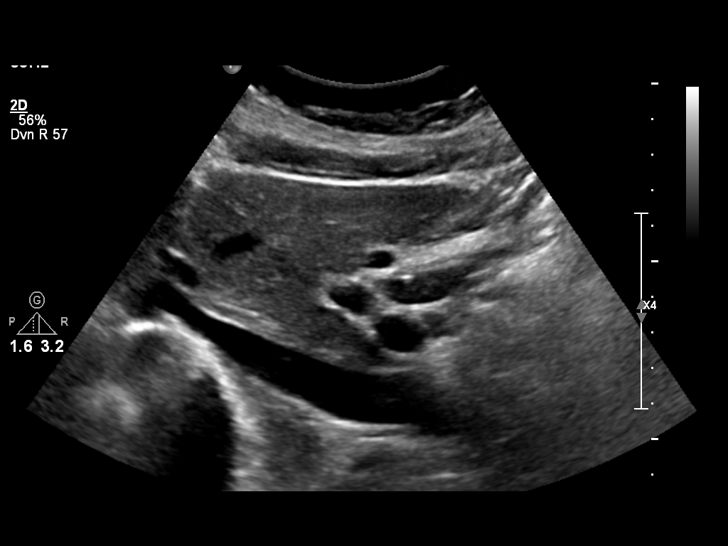
[im 19/75]
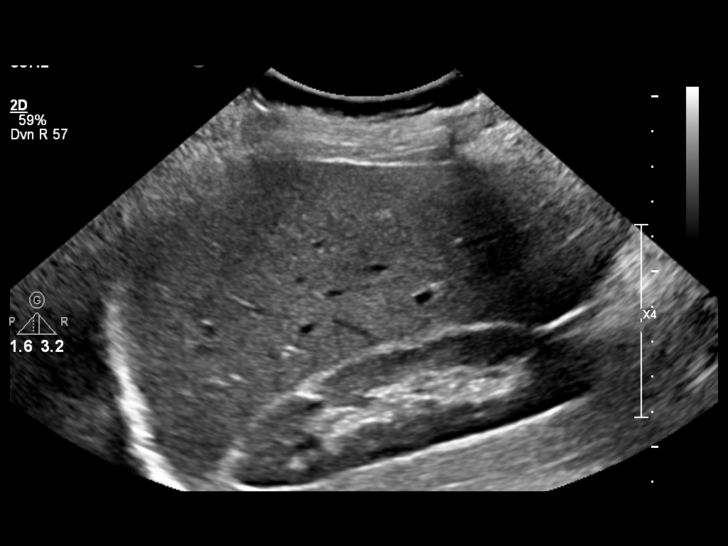
[im 25/75]
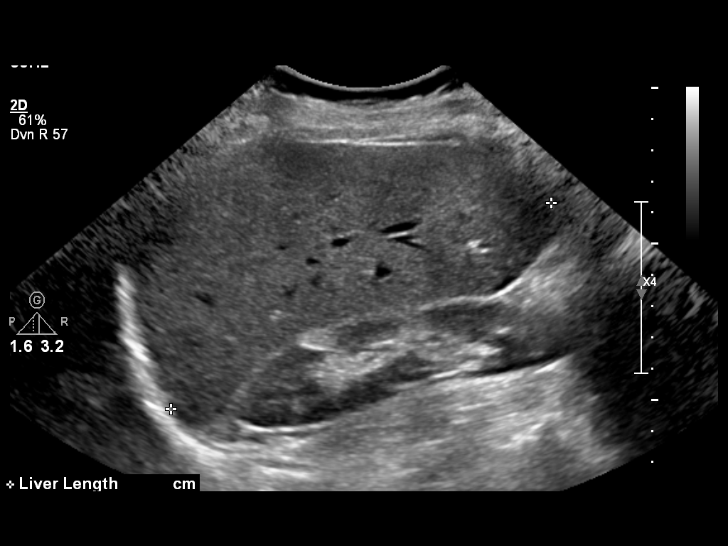
[im 28/75]
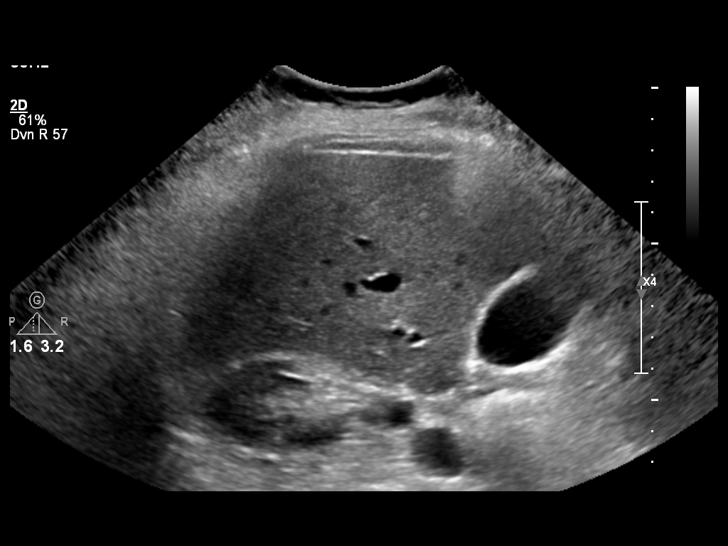
[im 34/75]
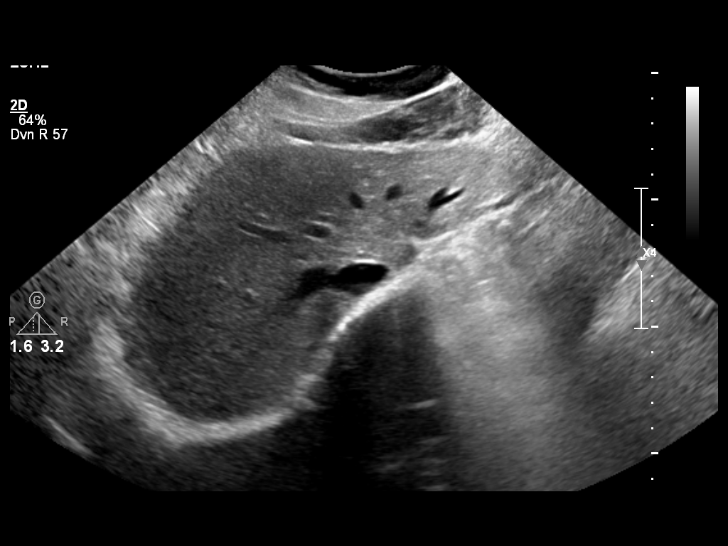
[im 41/75]
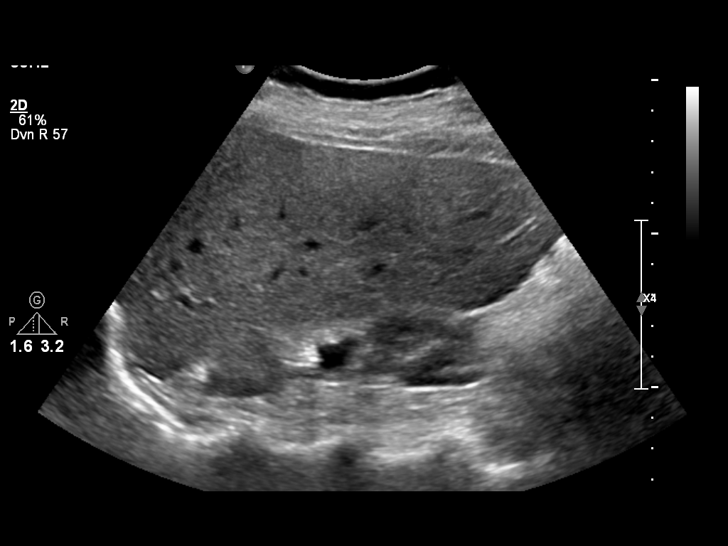
[im 47/75]
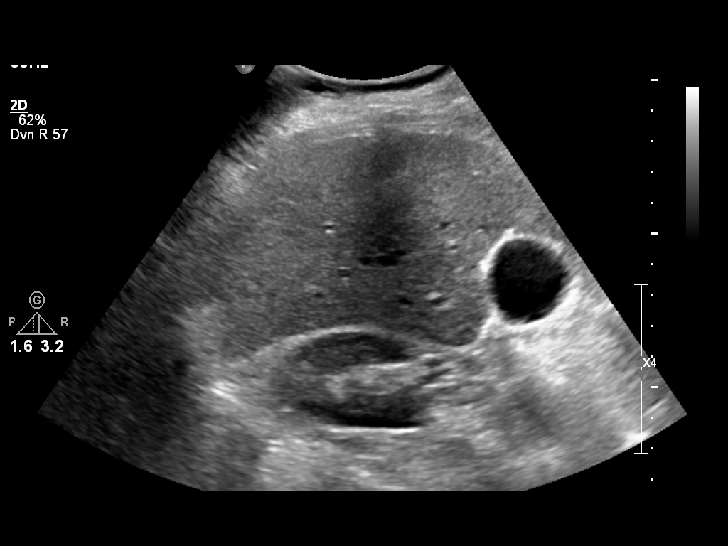
[im 50/75]
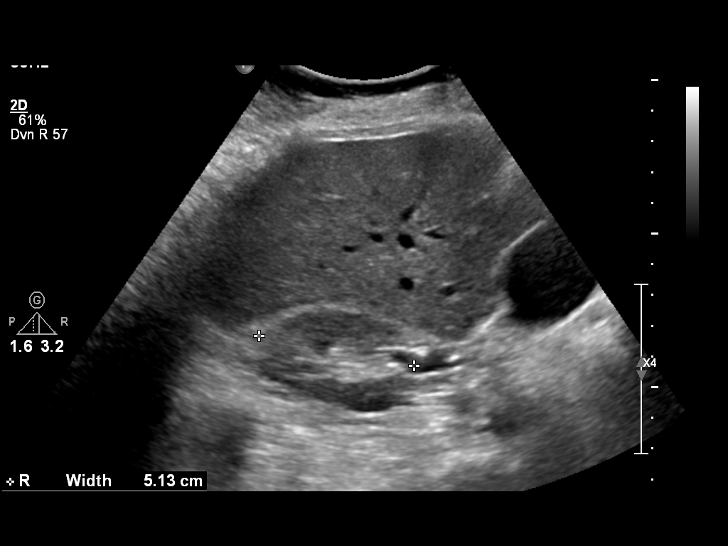
[im 56/75]
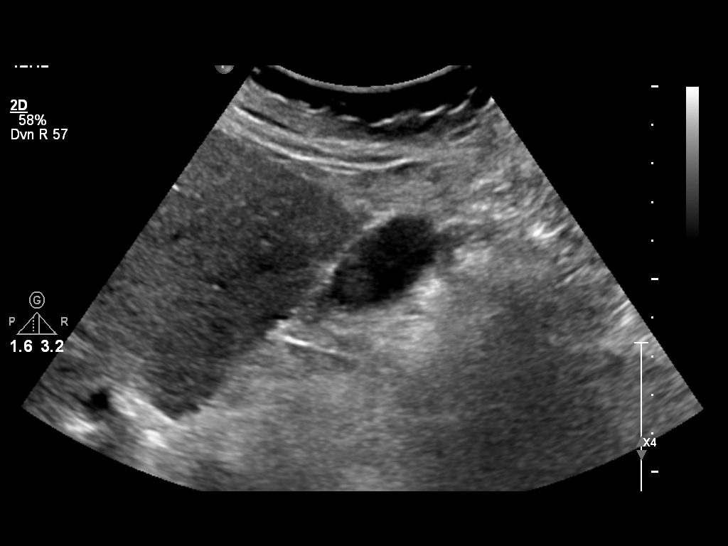
[im 62/75]
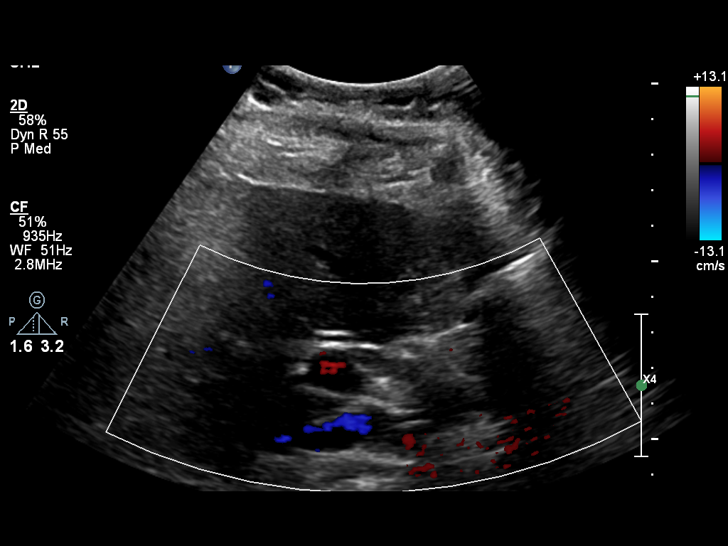
[im 68/75]
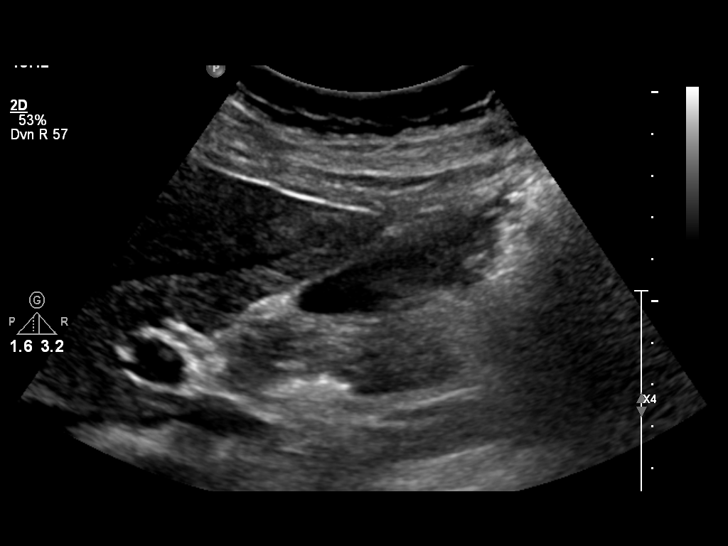
[im 75/75]
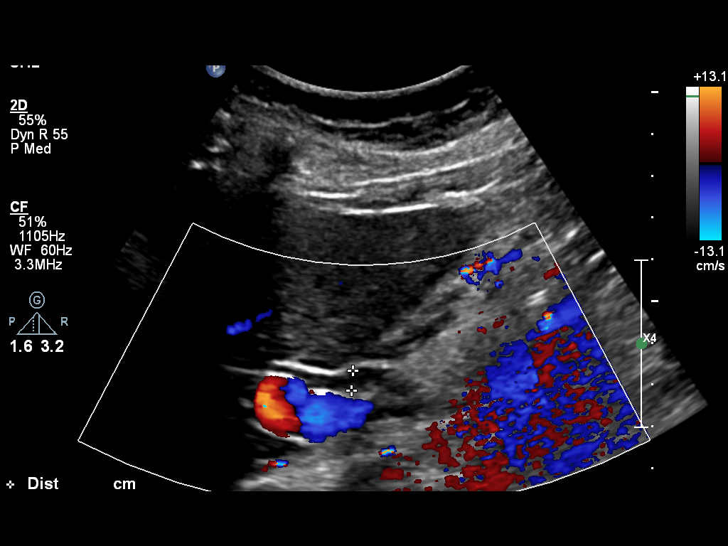

[14 of 25 positions shown; findings below may reference images not displayed]

FINDINGS: Liver: Normal echogenicity. No lesions. Portal vein is patent.
Bile Ducts: No dilated intrahepatic biliary radicles. Common bile duct measures 5 mm.
Gallbladder: No cholelithiasis, wall thickening, or pericholecystic fluid. Negative sonographic [name] sign.
Pancreas: Normal where visualized.
Right Kidney: Measures 10.9 cm. Normal echogenicity. No hydronephrosis.
Other: None.
IMPRESSION: Unremarkable right upper quadrant ultrasound.

## 2023-09-24 ENCOUNTER — Inpatient Hospital Stay
Admit: 2023-09-24 | Discharge: 2023-09-26 | Disposition: A | Discharge status: Home / Self Care | Payer: PRIVATE HEALTH INSURANCE | Source: Home / Self Care

## 2023-09-24 ENCOUNTER — Ambulatory Visit: Payer: PRIVATE HEALTH INSURANCE

## 2023-09-24 DIAGNOSIS — K5903 Drug induced constipation: Secondary | ICD-10-CM

## 2023-09-24 DIAGNOSIS — E861 Hypovolemia: Secondary | ICD-10-CM

## 2023-09-24 DIAGNOSIS — R109 Unspecified abdominal pain: Secondary | ICD-10-CM

## 2023-09-24 LAB — Pregnancy Test Urine, POC: PREG TEST URINE, POC: NEGATIVE

## 2023-09-24 LAB — UA,Dipstick,POC: KETONE UR,POC: NEGATIVE (ref 1.005–1.030)

## 2023-09-24 LAB — Aspartate Aminotransferase: ASPARTATE AMINOTRANSFERASE: 18 U/L (ref 13–62)

## 2023-09-24 LAB — Alkaline Phosphatase: ALKALINE PHOSPHATASE: 96 U/L (ref 37–113)

## 2023-09-24 LAB — Amylase: AMYLASE: 121 U/L (ref 31–124)

## 2023-09-24 LAB — Albumin: ALBUMIN: 4.3 g/dL (ref 3.9–5.0)

## 2023-09-24 LAB — Basic Metabolic Panel
POTASSIUM: 4.2 mmol/L (ref 3.6–5.3)
TOTAL CO2: 22 mmol/L (ref 20–30)

## 2023-09-24 LAB — Lipase: LIPASE: 23 U/L (ref 13–69)

## 2023-09-24 LAB — Blood Lactate: BLOOD LACTATE: 10 mg/dL (ref 5–18)

## 2023-09-24 LAB — Differential Automated: NEUTROPHIL PERCENT, AUTO: 65.3 (ref 0.00–0.04)

## 2023-09-24 LAB — Alanine Aminotransferase: ALANINE AMINOTRANSFERASE: 11 U/L (ref 8–70)

## 2023-09-24 LAB — Bilirubin,Conjugated: BILIRUBIN,CONJUGATED: <0.2 mg/dL (ref <=0.3)

## 2023-09-24 LAB — CBC: HEMOGLOBIN: 10.6 g/dL — ABNORMAL LOW (ref 11.6–15.2)

## 2023-09-24 LAB — Extra Light Green Top

## 2023-09-24 MED ADMIN — OXYCODONE HCL 5 MG PO TABS: 15 mg | ORAL | @ 23:00:00 | Stop: 2023-09-24 | NDC 68084035411

## 2023-09-24 MED ADMIN — ACETAMINOPHEN 10 MG/ML IV SOLN: 1000 mg | INTRAVENOUS | @ 18:00:00 | Stop: 2023-09-24

## 2023-09-24 MED ADMIN — NICOTINE 7 MG/24HR TD PT24: 7 mg | TRANSDERMAL | @ 20:00:00 | Stop: 2023-09-27 | NDC 00536589488

## 2023-09-24 MED ADMIN — SODIUM CHLORIDE 0.9% IV SOLN (100 ML): 5 mL/h | INTRAVENOUS | @ 20:00:00 | Stop: 2023-10-24 | NDC 00338004938

## 2023-09-24 MED ADMIN — DIPHENHYDRAMINE HCL 50 MG/ML IJ SOLN: 50 mg | INTRAVENOUS | @ 15:00:00 | Stop: 2023-09-24 | NDC 00641037621

## 2023-09-24 MED ADMIN — QUETIAPINE FUMARATE 100 MG PO TABS: 100 mg | ORAL | @ 20:00:00 | Stop: 2023-09-27 | NDC 60687034911

## 2023-09-24 MED ADMIN — OXYCODONE HCL 5 MG PO TABS: 5 mg | ORAL | @ 23:00:00 | Stop: 2023-09-27

## 2023-09-24 MED ADMIN — HYDROMORPHONE HCL 1 MG/ML IJ SOLN: 1 mg | INTRAVENOUS | @ 18:00:00 | Stop: 2023-09-24

## 2023-09-24 MED ADMIN — PANCRELIPASE (LIP-PROT-AMYL) 36000-114000 UNITS PO CPEP: 36000 [IU] | ORAL | @ 20:00:00 | Stop: 2023-09-27 | NDC 00032301613

## 2023-09-24 MED ADMIN — OXYCODONE HCL 5 MG PO TABS: 15 mg | ORAL | @ 19:00:00 | Stop: 2023-10-01 | NDC 68084035411

## 2023-09-24 MED ADMIN — SODIUM CHLORIDE 0.9 % IV BOLUS: 1000 mL | INTRAVENOUS | @ 15:00:00 | Stop: 2023-09-24 | NDC 00338004904

## 2023-09-24 MED ADMIN — ACETAMINOPHEN 10 MG/ML IV SOLN: 1000 mg | INTRAVENOUS | @ 20:00:00 | Stop: 2023-09-26 | NDC 00264410090

## 2023-09-24 MED ADMIN — HYDROMORPHONE HCL 1 MG/ML IJ SOLN: 1 mg | INTRAVENOUS | @ 16:00:00 | Stop: 2023-09-24 | NDC 00409128331

## 2023-09-24 MED ADMIN — LIDOCAINE 5 % EX PTCH: 1 | TRANSDERMAL | @ 20:00:00 | Stop: 2023-09-27

## 2023-09-24 MED ADMIN — ONDANSETRON HCL 4 MG/2ML IJ SOLN: 4 mg | INTRAVENOUS | @ 15:00:00 | Stop: 2023-09-24 | NDC 60505613000

## 2023-09-24 MED ADMIN — FAMOTIDINE (PF) 20 MG/2ML IV SOLN: 20 mg | INTRAVENOUS | @ 15:00:00 | Stop: 2023-09-24 | NDC 67457043300

## 2023-09-24 MED ADMIN — IOHEXOL 350 MG/ML IV SOLN: 100 mL | INTRAVENOUS | @ 16:00:00 | Stop: 2023-09-24 | NDC 00407141491

## 2023-09-24 MED ADMIN — OXYCODONE HCL 5 MG PO TABS: 15 mg | ORAL | @ 23:00:00 | Stop: 2023-10-01 | NDC 68084035411

## 2023-09-24 MED ADMIN — DULOXETINE HCL 30 MG PO CPEP: 60 mg | ORAL | @ 20:00:00 | Stop: 2023-09-27 | NDC 60687073411

## 2023-09-24 NOTE — ED Notes
Pt medicated for pain. 9/10. Pt is resting comfortably NAD

## 2023-09-24 NOTE — Discharge Instructions
Emergency Department Discharge Instructions      Summary of your visit  You have been evaluated in the [facility] Emergency Department today for abdominal pain.  Your evaluation included a physical exam, imaging, and labs.  We have not found a serious cause of the abdominal pain at this time. However, some abdominal problems may take more time to appear. Therefore, it is important that you carefully watch for changes in the abdominal pain (such as any new symptoms or worsening of your current condition).     Follow-Up  Please follow up with your primary care physician within three days after being discharged from the ER - you can call to schedule an appointment.  You can find a primary care physician at [facility] by calling [phone].    You can also follow-up with a gastroenterologist at [facility] by calling [phone].  If you do not have a Primary Care Physician, please call your insurance company or you may call [phone] to establish care with a [facility] physician.  If you are uninsured, please call 2-1-1 to find a free or low-cost clinic in your area. 2-1-1 [place] is the central source for providing information and referrals for all health and human services in [facility] [place]. Our 2-1-1 phone line is open 24 hours, 7 days a week, with trained [employer] prepared to offer help with any situation, any time. Our community services go far beyond phone referrals - explore our website to learn more. If you are calling from outside [facility] or cannot directly dial 2-1-1, you can call [phone].      Return to the Emergency Department if you experience:  Fevers 100.4 ?F or greater  Worsening or uncontrolled pain  Persistent nausea and vomiting  Inability to tolerate food or fluids by mouth  Bloody stools or vomit  Black or tarry stools  Any other concerning symptoms     Thank you for choosing [facility] for your care. It was a pleasure taking part in your care today, and we wish you the best!

## 2023-09-24 NOTE — Progress Notes
Pharmaceutical Services - Medication Reconciliation Note - ED    Patient Name: Donna Gross  Medical Record Number: [id number]  Admit date:      Age: 57 y.o.  Sex: female  Allergies:   Allergies   Allergen Reactions    Hydrocodone Other (See Comments)     ''burns a hole in stomach''  Tolerates hydromorphone    Ibuprofen Other (See Comments)     GI discomfort    ''hurts my stomach''    Morphine Itching     Tolerates hydromorphone     Height:   Most recent documented height   09/10/23 1.626 m (5' 4'')     Actual Weight:   Most recent documented weight   09/24/23 49 kg   09/10/23 48.7 kg     Diagnosis: The patient is currently admitted with the following concerns/issues: Active Problems:    * No active hospital problems. *      Reported Medication History   I spoke with the patient at bedside and used Surescripts (outpatient Rx fill history database) to update the home medication list for this hospital admission.    PTA Medication List (discrepancies are noted)   Current Outpatient Medications   Medication Sig Note Last Dose    acetaminophen 500 mg tablet Take 2 tablets (1,000 mg total) by mouth every six (6) hours as needed for Pain.  09/23/2023    albuterol 90 mcg/act inhaler Inhale 2 puffs every six (6) hours as needed for Wheezing or Shortness of Breath.  Past Week    atorvastatin 40 mg tablet Take 1 tablet (40 mg total) by mouth at bedtime.  09/23/2023    B Complex Vitamins (VITAMIN B COMPLEX) capsule Take 1 capsule by mouth daily.  09/23/2023    Cholecalciferol (VITAMIN D3) 50 mcg (2000 units) TABS Take 1 tablet (50 mcg total) by mouth daily.  09/23/2023    cholestyramine 4 g/dose powder Take 1 scoop (4 grams total) by mouth three (3) times daily with meals.  09/23/2023    DULoxetine 60 mg DR capsule Take 1 capsule (60 mg total) by mouth daily.  09/23/2023    fluticasone-salmeterol 250-50 mcg/act diskus inhaler Inhale 1 puff two (2) times daily.  09/23/2023    hyoscyamine 0.125 mg tablet Take 1 tablet (125 mcg total) by mouth every four (4) hours as needed for Cramping.  Past Week    ipratropium-albuterol 20-100 mcg/act inhaler Inhale 2 puffs four (4) times daily as needed for Wheezing or Shortness of Breath.  Past Week    lidocaine 5% patch Place 3 patches onto the skin daily 12 hours on 12 hours off. 09/24/2023: Needs refill Past Week    loperamide 2 mg capsule Take 1 capsule (2 mg total) by mouth four (4) times daily as needed for Diarrhea.  Past Week    naloxone 4 mg/0.1 mL nasal spray Call 911. Administer a single spray intranasally into one nostril for opioid overdose. May repeat in 3 minutes if patient is not breathing..      pancrelipase, Lip-Prot-Amyl, (CREON) 36000 units DR capsule Take 1 capsule (36,000 units of lipase total) by mouth three (3) times daily with meals.  09/23/2023    pantoprazole 40 mg DR tablet Take 1 tablet (40 mg total) by mouth daily.  09/23/2023    pregabalin 25 mg capsule Take 1 capsule (25 mg total) by mouth two (2) times daily. Max Daily Amount: 50 mg 09/24/2023: Needs refill Past Week    QUEtiapine 100 mg tablet Take  1 tablet (100 mg total) by mouth two (2) times daily. 09/24/2023: Needs refill Past Week    ranolazine 500 mg 12 hr tablet Take 1 tablet (500 mg total) by mouth two (2) times daily.  09/23/2023    aspirin 81 mg chewable tablet Chew 1 tablet (81 mg total) by mouth daily. (Patient not taking: Reported on 09/24/2023.)  Not Taking    calcium carbonate 500 mg chewable tablet Chew 1 tablet (500 mg total) by mouth two (2) times daily as needed Calcium Carbonate 500 mg is equivalent to 200 mg elemental Calcium. (Patient not taking: Reported on 09/24/2023.)  Not Taking    clobetasol 0.05% cream Apply topically three (3) times daily as needed (rash). (Patient not taking: Reported on 09/24/2023.)  Not Taking    clopidogrel 75 mg tablet Take 1 tablet (75 mg total) by mouth daily. (Patient not taking: Reported on 09/24/2023.) 09/24/2023: Not familiar with med despite fill hx, believes might not be taking PTA    Currently ordered, query if good historian Not Taking    Menthol-Methyl Salicylate (ANALGESIC BALM) 10-15 % cream Apply topically four (4) times daily. (Patient not taking: Reported on 09/24/2023.)  Not Taking    ondansetron 4 mg tablet Take 1 tablet (4 mg total) by mouth every six (6) hours as needed for Nausea or Vomiting.      Polyethylene Glycol 3350 (PEG 3350) 17 GM/SCOOP POWD Mix 1 capful (17 grams) in 6 to 8 ounces of liquid and drink by mouth two (2) times daily. (Patient not taking: Reported on 09/24/2023.)  Not Taking    senna 8.6 mg tablet Take 1 tablet by mouth at bedtime as needed for Constipation. (Patient not taking: Reported on 09/24/2023.)  Not Taking    sodium sulfate-potassium sulfate-magnesium sulfate solution At 6pm the evening before colonoscopy, drink 16oz x1 dose, then drink 32oz water over 1 hour. At 5am the day of colonoscopy, repeat above. 09/24/2023: Original procedure date was cancelled.  Never rescheduled a new date.      The patient's allergies and medications have been reviewed and updated.       There are no issues requiring follow up at this time.    Kiese Kilungidi Dianzungu, 09/24/2023, 1:36 PM    -------------------------------------------------------------------------------------------------------------------  I have reviewed the medication list compiled by the medication reconciliation pharmacy technician and agree with the findings.      Reconciliation  The assessment and reconciliation of admission and inpatient orders with PTA medication list is complete. There are no issues requiring follow up at this time.     This note represents our good faith effort to obtain the best possible medication history from all attainable sources at the time of reconciliation    Lorayne Bender, PharmD, AAHIVP  Transitions of Care Pharmacist  [phone]    Medication Dispense History (from 03/28/2023 to 09/24/2023)       Acetaminophen          Dispensed Days Supply Quantity Provider Pharmacy acetaminophen 500 mg tablet 08/13/2023 8 60 tablet Dekitani, Rayford Halsted., MD [place] [address] [facility]    ACETAMINOPHEN 500 MG TABLET 08/11/2023 8 60 device Dekitani, Meghan E., MD [place] [address] [facility]    acetaminophen 500 mg tablet 05/16/2023 8 60 tablet Lyla Son, Lynne Logan, MD [place] [address] [facility]...    ACETAMINOPHEN 500 MG TABLET 05/16/2023 8 60 device Lyla Son, Lynne Logan, MD [place] [address] [facility]  Albuterol Sulfate          Dispensed Days Supply Quantity Provider Pharmacy    ALBUTEROL HFA 90 MCG INHALER 07/26/2023 18 18 g Woodroe Mode, MD [facility] [phone] - I...                    Apixaban          Dispensed Days Supply Quantity Provider Pharmacy    apixaban 5 mg tablet 04/12/2023 30 60 tablet Marla Roe., NP, RN [place] [address] [facility].Marland KitchenMarland Kitchen                    Aspirin          Dispensed Days Supply Quantity Provider Pharmacy    aspirin 81 mg chewable tablet 08/13/2023 90 90 tablet Dekitani, Rayford Halsted., MD [place] [address] [facility]...    aspirin 81 mg chewable tablet 05/01/2023 90 90 tablet Debbe Mounts, MD [place] [address] [facility].Marland KitchenMarland Kitchen                    Atorvastatin Calcium          Dispensed Days Supply Quantity Provider Pharmacy    atorvastatin 40 mg tablet 08/13/2023 90 90 tablet Dekitani, Rayford Halsted., MD [place] [address] [facility]...    ATORVASTATIN 40 MG TABLET 05/29/2023 90 90 device Woodroe Mode, MD [facility] [phone] - I...                    Bismuth Subsalicylate          Dispensed Days Supply Quantity Provider Pharmacy    BISMUTH 262 MG TABLET CHEW 05/22/2023 8 30 device Brandy Hale, MD [facility] [phone] - I...                    Calcium Carbonate Antacid          Dispensed Days Supply Quantity Provider Pharmacy    calcium carbonate 500 mg chewable tablet 05/01/2023 15 30 tablet Debbe Mounts, MD [place] [address] [facility].Marland KitchenMarland Kitchen                    Cephalexin          Dispensed Days Supply Quantity Provider Pharmacy    CEPHALEXIN 500 MG CAPSULE 09/06/2023 10 40 device Woodroe Mode, MD [facility] [phone] - I...                    Cholecalciferol          Dispensed Days Supply Quantity Provider Pharmacy    VITAMIN D3 50 MCG SOFTGEL 06/18/2023 90 90 device Woodroe Mode, MD [facility] [phone] - I...    VITAMIN D3 50 MCG SOFTGEL 03/30/2023 90 90 device Woodroe Mode, MD [facility] [phone] - I...                    Cholestyramine          Dispensed Days Supply Quantity Provider Pharmacy    cholestyramine 4 g/dose powder 08/13/2023 32 378 g Dekitani, Rayford Halsted., MD [place] [address] [facility].Marland KitchenMarland Kitchen                    Clopidogrel Bisulfate          Dispensed Days Supply Quantity Provider Pharmacy    clopidogrel 75 mg tablet 08/13/2023 90 90 tablet Dekitani, Rayford Halsted., MD [place] [address] [facility]...    clopidogrel 75 mg tablet 05/01/2023 30 30 tablet Debbe Mounts, MD [facility]  [address] [facility].Marland KitchenMarland Kitchen                    Cyanocobalamin          Dispensed Days Supply Quantity Provider Pharmacy    VITAMIN B-12 1,000 MCG TABLET 07/26/2023 90 90 device Woodroe Mode, MD [facility] [phone] - I...                    DULoxetine HCl          Dispensed Days Supply Quantity Provider Pharmacy    DULoxetine 60 mg DR capsule 08/13/2023 30 30 capsule Dekitani, Rayford Halsted., MD [place] [address] [facility]...    DULOXETINE HCL DR 30 MG CAP 06/27/2023 90 90 device Woodroe Mode, MD [facility] [phone] - I...    DULoxetine 60 mg DR capsule 05/01/2023 30 30 capsule Debbe Mounts, MD [place] [address] [facility].Marland KitchenMarland Kitchen                    Fluticasone Propionate HFA          Dispensed Days Supply Quantity Provider Pharmacy    FLUTICASONE PROP HFA 220 MCG 07/26/2023 60 12 g Woodroe Mode, MD [facility] [phone] - I...                    Fluticasone-Salmeterol          Dispensed Days Supply Quantity Provider Pharmacy    ADVAIR 250-50 DISKUS 08/29/2023 30 60 device Woodroe Mode, MD [facility] [phone] - I...    ADVAIR 250-50 DISKUS 07/26/2023 30 60 device Woodroe Mode, MD [facility] [phone] - I...                    Gabapentin          Dispensed Days Supply Quantity Provider Pharmacy    gabapentin 300 mg capsule 05/01/2023 30 90 capsule Debbe Mounts, MD [place] [address] [facility].Marland KitchenMarland Kitchen                    Glucose Blood          Dispensed Days Supply Quantity Provider Pharmacy    FREESTYLE LITE TEST STRIP 08/19/2023 100 100 device Woodroe Mode, MD [facility] [phone] - I...                    HYDROcodone-Acetaminophen          Dispensed Days Supply Quantity Provider Pharmacy    [facility] 5-325 MG 05/28/2023 3 10 device Lubertha South, MD [facility] [phone] - I...                    Hyoscyamine Sulfate          Dispensed Days Supply Quantity Provider Pharmacy    hyoscyamine 0.125 mg tablet 08/13/2023 10 60 tablet Dekitani, Rayford Halsted., MD [place] [address] [facility]...    hyoscyamine 0.125 mg tablet 05/16/2023 5 30 tablet Lyla Son, Lynne Logan, MD [place] [address] [facility].Marland KitchenMarland Kitchen                    Ipratropium-Albuterol          Dispensed Days Supply Quantity Provider Pharmacy    COMBIVENT RESPIMAT 20-100 MCG 07/26/2023 30 4 g Woodroe Mode, MD [facility] [phone] - I...                    Lancets          Dispensed Days Supply Quantity Provider Pharmacy  FREESTYLE 28G LANCETS 09/06/2023 100 100 device Woodroe Mode, MD [facility] [phone] - I...                    Lidocaine          Dispensed Days Supply Quantity Provider Pharmacy    lidocaine 5% patch 08/13/2023 30 90 patch Dekitani, Meghan E., MD [place] [address] [facility]...    lidocaine 5% patch 05/16/2023 10 30 patch Lyla Son, Lynne Logan, MD [place] [address] [facility].Marland KitchenMarland Kitchen                    Loperamide HCl          Dispensed Days Supply Quantity Provider Pharmacy    loperamide 2 mg capsule 08/13/2023 8 30 capsule Dekitani, Rayford Halsted., MD [place] [address] [facility]...    loperamide 2 mg capsule 05/16/2023 8 30 capsule Lyla Son, Lynne Logan, MD [place] [address] [facility].Marland KitchenMarland Kitchen                    Naloxone HCl          Dispensed Days Supply Quantity Provider Pharmacy    [facility] 4 MG NASAL SPRAY 05/28/2023 1 2 device Lubertha South, MD [facility] [phone] - I...                    Ondansetron HCl          Dispensed Days Supply Quantity Provider Pharmacy    ondansetron 4 mg tablet 08/13/2023 8 30 tablet Dekitani, Rayford Halsted., MD [place] [address] [facility]...    ONDANSETRON HCL 8 MG TABLET 07/26/2023 30 60 device Woodroe Mode, MD [facility] [phone] - I...    ONDANSETRON HCL 4 MG TABLET 05/22/2023 3 18 device Brandy Hale, MD [facility] [phone] - I...    ondansetron 4 mg tablet 05/16/2023 8 30 tablet Lyla Son, Lynne Logan, MD [place] [address] [facility]...    ondansetron 4 mg tablet 05/01/2023 8 30 tablet Debbe Mounts, MD [place] [address] [facility].Marland Kitchen.                    oxyCODONE HCl          Dispensed Days Supply Quantity Provider Pharmacy    OXYCODONE HCL (IR) 5 MG TABLET 08/29/2023 4 20 device Lennie Odor., NP [facility] [phone] - I...    oxyCODONE 10 mg tablet 05/16/2023 10 40 tablet Lyla Son, Lynne Logan, MD [place] [address] [facility].Marland Kitchen.                    oxyCODONE-Acetaminophen          Dispensed Days Supply Quantity Provider Pharmacy    OXYCODONE-ACETAMINOPHN 7.5-325 09/08/2023 30 120 device Woodroe Mode, MD [facility] [phone] - I...    OXYCODONE-ACETAMINOPHN 7.5-325 07/30/2023 30 120 device Woodroe Mode, MD [facility] [phone] - I...    OXYCODONE-ACETAMINOPHN 7.5-325 07/01/2023 30 120 device Woodroe Mode, MD [facility] [phone] - I...    OXYCODONE-ACETAMINOPHN 7.5-325 06/01/2023 30 120 device Woodroe Mode, MD [facility] [phone] - I...    OXYCODONE-ACETAMINOPHN 7.5-325 04/13/2023 30 120 device Woodroe Mode, MD [facility] [phone] - I...                    Pancrelipase (Lip-Prot-Amyl)          Dispensed Days Supply Quantity Provider Pharmacy    pancrelipase, Lip-Prot-Amyl, (CREON) 36000 units DR capsule 08/29/2023 30 90 capsule Dekitani, Meghan E., MD [place] [address] [facility]...    CREON DR 36,000 UNIT  CAPSULE 07/26/2023 33 100 device Woodroe Mode, MD [facility] [phone] - I...                    Pantoprazole Sodium          Dispensed Days Supply Quantity Provider Pharmacy    PANTOPRAZOLE SOD DR 40 MG TAB 09/24/2023 90 90 device Woodroe Mode, MD [facility] [phone]    pantoprazole 40 mg DR tablet 08/29/2023 30 30 tablet Dekitani, Rayford Halsted., MD [place] [address] [facility].Marland KitchenMarland Kitchen    PANTOPRAZOLE SOD DR 40 MG TAB 06/18/2023 90 90 device Woodroe Mode, MD [facility] [phone] - I...    PANTOPRAZOLE SOD DR 40 MG TAB 05/22/2023 30 30 device Brandy Hale, MD [facility] [phone] - I...                    Polyethylene Glycol 3350          Dispensed Days Supply Quantity Provider Pharmacy    Polyethylene Glycol 3350 (PEG 3350) 17 GM/SCOOP POWD 08/13/2023 15 510 g Dekitani, Meghan E., MD [place] [address] [facility]...    POLYETHYLENE GLYCOL 3350 POWD 08/11/2023 15 510 g Dekitani, Meghan E., MD [place] [address] [facility]    Polyethylene Glycol 3350 (PEG 3350) 17 GM/SCOOP POWD 05/01/2023 30 510 g Debbe Mounts, MD [place] [address] [facility]...    POLYETHYLENE GLYCOL 3350 POWD 05/01/2023 30 510 g Debbe Mounts, MD [place] [address] [facility]                    Pregabalin          Dispensed Days Supply Quantity Provider Pharmacy    pregabalin 25 mg capsule 08/13/2023 30 60 capsule Dekitani, Rayford Halsted., MD [place] [address] [facility]...    pregabalin 25 mg capsule 05/16/2023 30 60 capsule Lyla Son, Lynne Logan, MD [place] [address] [facility].Marland KitchenMarland Kitchen                    Prochlorperazine Maleate          Dispensed Days Supply Quantity Provider Pharmacy    PROCHLORPERAZINE 10 MG TAB 06/27/2023 90 270 device Woodroe Mode, MD [facility] [phone] - I...                    QUEtiapine Fumarate          Dispensed Days Supply Quantity Provider Pharmacy    QUEtiapine 100 mg tablet 08/13/2023 30 60 tablet Dekitani, Rayford Halsted., MD [place] [address] [facility]...    QUEtiapine 100 mg tablet 05/01/2023 15 30 tablet Debbe Mounts, MD [place] [address] [facility]...    QUEtiapine 50 mg tablet 05/01/2023 15 30 tablet Debbe Mounts, MD [place] [address] [facility].Marland KitchenMarland Kitchen                    Ranolazine          Dispensed Days Supply Quantity Provider Pharmacy    RANOLAZINE ER 500 MG TABLET 07/26/2023 90 180 device Woodroe Mode, MD [facility] [phone] - I...    RANOLAZINE ER 500 MG TABLET 03/30/2023 90 180 device Woodroe Mode, MD [facility] [phone] - I...                    Sennosides          Dispensed Days Supply Quantity Provider Pharmacy    senna 8.6 mg tablet 05/01/2023 60 60 tablet Debbe Mounts, MD [place] [address] [facility]...    SENNA 8.6 MG TABLET 05/01/2023 60 60  device Debbe Mounts, MD [place] [address] [facility]                          Disclaimer    Certain information may not be available or accurate in this report, including over-the-counter medications, low cost prescriptions, prescriptions paid for by the patient or non-participating sources, or errors in insurance c laims information. The provider should independently verify medication history with the patient.       External Sources       Source Last Checked for Updates Status    Surescripts (Acute Care) 09/24/2023  6:54 AM History Response Filed    Surescripts (Ambulatory) 09/01/2023  4:28 PM History Response Ceasar Mons

## 2023-09-24 NOTE — ED Provider Notes
Chief Complaint:   Abdominal Pain (Abdominal pain, nausea, vomiting, and diarrhea for several days.)    History:  Donna Gross is a 57 y.o. female with a history of intestinal stoma prolapse, duodenal ulcer, peptic ulcer disease, arterial hypotension, gallstones, colitis, superior mesenteric artery stenosis, anxiety, depression, emphysema, pancreatitis, superior mesenteric artery stent 04/30/23, and blood transfusion 05/17/23, presenting with abdominal pain, onset 4-5 days ago. Patient describes experiencing associated vomiting and diarrhea for the past 4-5 days.  Patient states that this is the ?same old pain? that she has been seen multiple times for but that they keep being unable to diagnose.  She was told that on her last admission a special tests will be done but they did not do it and that she keeps having this pain.  Patient is compliant with changing her diet, without relief of symptoms. Patient is compliant with taking Percocet without relief of symptoms. Patient denies any known history of IBS.  She has bloating, vomiting, and diarrhea with abdominal cramping.  She has had more than 50 episodes of this pain which seems to come and go without any clear diagnosis or resolution.  She has had no GI bleed symptoms, no vaginal bleeding, no urinary symptoms, no chest pain, no shortness of breath, no fevers or chills.  Nothing she can think of makes it better or worse.      Pt has not been maintaining social distance, wearing a mask, or following COVID precautions and is, thus, at higher risk of COVID.    PMH:   Past Medical History:   Diagnosis Date    Anxiety     Depression     Emphysema, unspecified (HCC/RAF)     Emphysema, unspecified (HCC/RAF)     Fibromyalgia     Gastritis     GERD (gastroesophageal reflux disease)     History of blood transfusion     Intractable nausea and vomiting 01/23/2020    Pancreatitis        PSH:   Past Surgical History:   Procedure Laterality Date    ABDOMINAL SURGERY      COLON SURGERY         FamHx: Noncontributory except for as noted in history of present illness.    Medications:   Previous Medications    ACETAMINOPHEN 500 MG TABLET    Take 2 tablets (1,000 mg total) by mouth every six (6) hours as needed for Pain.    ALBUTEROL 90 MCG/ACT INHALER    Inhale 2 puffs every six (6) hours as needed for Wheezing or Shortness of Breath.    ASPIRIN 81 MG CHEWABLE TABLET    Chew 1 tablet (81 mg total) by mouth daily.    ATORVASTATIN 40 MG TABLET    Take 1 tablet (40 mg total) by mouth at bedtime.    B COMPLEX VITAMINS (VITAMIN B COMPLEX) CAPSULE    Take 1 capsule by mouth daily.    CALCIUM CARBONATE 500 MG CHEWABLE TABLET    Chew 1 tablet (500 mg total) by mouth two (2) times daily as needed Calcium Carbonate 500 mg is equivalent to 200 mg elemental Calcium.    CHOLECALCIFEROL (VITAMIN D3) 50 MCG (2000 UNITS) TABS    Take 1 tablet (50 mcg total) by mouth daily.    CHOLESTYRAMINE 4 G/DOSE POWDER    Take 1 scoop (4 grams total) by mouth three (3) times daily with meals.    CLOBETASOL 0.05% CREAM    Apply topically three (3) times daily  as needed (rash).    CLOPIDOGREL 75 MG TABLET    Take 1 tablet (75 mg total) by mouth daily.    DULOXETINE 60 MG DR CAPSULE    Take 1 capsule (60 mg total) by mouth daily.    FLUTICASONE-SALMETEROL 250-50 MCG/ACT DISKUS INHALER    Inhale 1 puff two (2) times daily.    HYOSCYAMINE 0.125 MG TABLET    Take 1 tablet (125 mcg total) by mouth every four (4) hours as needed for Cramping.    IPRATROPIUM-ALBUTEROL 20-100 MCG/ACT INHALER    Inhale 2 puffs four (4) times daily as needed for Wheezing or Shortness of Breath.    LIDOCAINE 5% PATCH    Place 3 patches onto the skin daily 12 hours on 12 hours off.    LOPERAMIDE 2 MG CAPSULE    Take 1 capsule (2 mg total) by mouth four (4) times daily as needed for Diarrhea.    MENTHOL-METHYL SALICYLATE (ANALGESIC BALM) 10-15 % CREAM    Apply topically four (4) times daily.    NALOXONE 4 MG/0.1 ML NASAL SPRAY    Call 911. Administer a single spray intranasally into one nostril for opioid overdose. May repeat in 3 minutes if patient is not breathing.Marland Kitchen    ONDANSETRON 4 MG TABLET    Take 1 tablet (4 mg total) by mouth every six (6) hours as needed for Nausea or Vomiting.    PANCRELIPASE, LIP-PROT-AMYL, (CREON) 36000 UNITS DR CAPSULE    Take 1 capsule (36,000 units of lipase total) by mouth three (3) times daily with meals.    PANTOPRAZOLE 40 MG DR TABLET    Take 1 tablet (40 mg total) by mouth daily.    POLYETHYLENE GLYCOL 3350 (PEG 3350) 17 GM/SCOOP POWD    Mix 1 capful (17 grams) in 6 to 8 ounces of liquid and drink by mouth two (2) times daily.    PREGABALIN 25 MG CAPSULE    Take 1 capsule (25 mg total) by mouth two (2) times daily. Max Daily Amount: 50 mg    QUETIAPINE 100 MG TABLET    Take 1 tablet (100 mg total) by mouth two (2) times daily.    RANOLAZINE 500 MG 12 HR TABLET    Take 1 tablet (500 mg total) by mouth two (2) times daily.    SENNA 8.6 MG TABLET    Take 1 tablet by mouth at bedtime as needed for Constipation.    SODIUM SULFATE-POTASSIUM SULFATE-MAGNESIUM SULFATE SOLUTION    At 6pm the evening before colonoscopy, drink 16oz x1 dose, then drink 32oz water over 1 hour. At 5am the day of colonoscopy, repeat above.       Allergies: She is allergic to hydrocodone, ibuprofen, and morphine.    Social History: Marital Status: single. She  reports that she has been smoking cigarettes. She uses smokeless tobacco.. She  reports no history of alcohol use..    Review of Systems: Pertinent systems reviewed and are negative except as per history of present illness.  In addition,   General: no fevers, chills, sweats  Cardiopulmonary no chest pain or SOB  GI tol po's but has pain as per hpi  Neuro no lethargy or weakness or numbness    Exam:   VITAL SIGNS:   ED Triage Vitals   Temp Temp Source BP Heart Rate Resp SpO2 O2 Device Pain Score Weight   09/24/23 0649 09/24/23 0649 09/24/23 0649 09/24/23 0649 09/24/23 0649 09/24/23 0649 -- -- 09/24/23 0650 36.1 ?C (  97 ?F) Oral 128/71 80 14 96 %   49 kg (108 lb 0.4 oz)      Const:        Appropriately interactive   Head:         Atraumatic   Eyes:                 Normal Conjunctiva, normal external exam   ENT:                 (+) Dry mucus membranes. Normal External Ears.    Neck:         Full range of motion.  No meningismus.   Resp:         Clear to auscultation bilaterally   Cardio:         Regular rate and rhythm, no murmurs   Abd:                 (+) Right sided guarding with diffuse tenderness to palpation between the lower and upper. No specific [name]'s sign or McBurney's tenderness. Soft, non distended. Normal bowel sounds   Skin:                 No petechia or rashes   Back:         No midline or flank tenderness   Ext:                  No cyanosis, or edema   Neur:                Awake and alert, MAES   Psych:          Normal Mood and Affect      Medical Decision Making and ED Course:  Prior and current EMR records were reviewed by myself as available and clinically relevant. Nurses notes and flow sheets were reviewed by myself.       Medical care was complex because The patient arrived at 46 by foot  when the patient's primary doctor or clinic was unavailable to provide care due to severe, acute symptoms that the patient felt precluded evaluation at the urgent care, clinic, or primary doctor's office tomorrow or later today.      Medical care in this case was highly complex due to concern for morbid process such as  metabolic acidodis, electrolyte abnormalities such as hyper/hypokalemia, hyper/hyponatremia, and and hyper/hypocalcemia, uremia, HUS, ARF, Appendicitis, ectopic pregnancy, PID, TOA, ovarian torsion, colitis, diverticulitis, SBO, pyelonephritis, obstructive uropathy, AAA,IBS,  and an additional, extensive differential diagnosis that also includes intestinal ischemia .  There was clinical concern for death, permanent debility, long term morbidity, acute threat to life or bodily function, acute decompensation, and/or increased pain without emergent evaluation and treatment.      Hisae Decoursey 's care was highly complex due to nature of the presenting problem, clinical evidence of hypovolemia, as indicated by dry mucous membranes, poor skin turgor, and sunken eyes,potential for morbid or mortal disease, and needIV fluids for adequate volume resuscitation     The patient's care was complex due to acute illness with systemic symptoms vs. acute exacerbation of chronic illness    PPE was used and maintained for evaluation of this patient.  Institutional protocols that pertain to patients at risk for COVID-19 are in a state of rapid change based on CDC and local guidelines.  Policies and guidelines were followed and met standards of care during the patients care in the ED.  In addition, morbid disease such as HIV, syphilis, autoimmune disease, and lupus are in the differential, although the patient does not present with significant known risk factors for them.  These can, however, be further worked up in follow up.    Extensive review of the medical records was performed including prior notes, imaging studies, and labs.  CT abd/pelvix 9/20 was unremarkable. CT 08/10/23 showed no evidence of acute abdominopelvic pathology. Atherosclerosis with patent mesenteric vasculature. Patent superior mesenteric artery stent. Also endoscopy 2/24 showed mild gastric erythema, otherwise normal endoscopy.    Labs were reviewed:  Labs Reviewed   CBC (PERFORMABLE) - Abnormal; Notable for the following components:       Result Value    Red Blood Cell Count 3.78 (*)     Hemoglobin 10.6 (*)     Hematocrit 34.4 (*)     MCH Concentration 30.8 (*)     Red Cell Distribution Width-SD 55.6 (*)     Red Cell Distribution Width-CV 16.6 (*)     Platelet Count, Auto 447 (*)     Mean Platelet Volume 9.0 (*)     All other components within normal limits   ALT (SGPT) - Normal   AST (SGOT) - Normal   BILIRUBIN,CONJ - Normal   ALKALINE PHOSPHATASE - Normal   ALBUMIN - Normal   LIPASE - Normal   AMYLASE - Normal   LACTATE - Normal   CBC & AUTO DIFFERENTIAL    Narrative:     The following orders were created for panel order CBC & Plt & Diff.  Procedure                               Abnormality         Status                     ---------                               -----------         ------                     ZOX[[id number]]                          Abnormal            Final result               Differential, Automated[[id number]]                          Final result                 Please view results for these tests on the individual orders.   BASIC METABOLIC PANEL   DIFFERENTIAL, AUTOMATED (PERFORMABLE)   RAINBOW DRAW TO LABORATORY    Narrative:     The following orders were created for panel order [facility] Draw to Laboratory Clinton Gallant).  Procedure                               Abnormality         Status                     ---------                               -----------         ------  Extra Burna Mortimer ZOX[[id number]]                            In process                   Please view results for these tests on the individual orders.   EXTRA LIGHT GREEN TOP   POCT URINALYSIS DIPSTICK   POCT URINE PREGNANCY     Treatment:  Medications   HYDROmorphone 1 mg/mL inj 1 mg (has no administration in time range)   sodium chloride 0.9% IV soln bolus 1,000 mL (1,000 mLs Intravenous New Bag/ Syringe/ Cartridge 09/24/23 0752)   ondansetron 4 mg/2 mL inj 4 mg (4 mg Intravenous Given 09/24/23 0747)   diphenhydrAMINE 50 mg/mL inj 50 mg (50 mg IV Push Given 09/24/23 0748)   famotidine (PF) 20 mg/2 mL inj 20 mg (20 mg IV Push Given 09/24/23 0748)       The patient responded appropriately to treatment and improved.    REASSESSMENTS    0715 although the patient has had an extensive negative workup she has significant tenderness on exam and reports that this pain is much worse than usual although it is the same pain she always has.  Therefore a CT scan is being ordered    0745 pt getting meds    0815 the patient feels better after her meds    0830 Labs were reviewed and noted.  CBC was unremarkable  BMP was unremarkable  LFTs were was unremarkable  Lipase was unremarkable    0930 the patient is in no acute distress but still has tenderness to palpation on exam.  We are pending results of the CT and pending urine studies.    1015 the patient is still has tenderness to palpation on exam. With po's dry heaves and says she can't take it.   The CT was negative and the UA was unremarkable.  She is getting additional pain medication and will be admitted to the hospital for pain control given her extensive history and persistent exam with concern for possible opiate seeking behavior  Alethia Berthold care was highly complex due to the nature of the presenting problem, the potential for morbid disease, and the need for multiple doses of analgesics to obtain adequate control of refractory pain    Care was complicated as pt required serial exams over 3:15 hours in the ED to ensure no acute morbid or mortal disease was present.    The patients care was complicated, requiring laboratory testing and advanced imaging such as [place], CT, or MRI, to ensure no acute morbid or mortal disease process.    I am the primary physician who cared for the patient in the Emergency Department.    DIAGNOSTIC PROCEDURES    1. Diagnostic Test Interpretation by me.  Pulse oximetry was 96% consistent with normal room air oxygenation.    2. Diagnostic Test Interpretation by me.  EKG rhythm monitor strip.  The rhythm appears to be normal sinus with a normal heart rate.  No ectopic beats or dysrhythmia was noted.      THERAPEUTIC PROCEDURES   Therapeutic Procedure - IV infusion.  Patient was given IV fluids and medication under my supervision due to signs and symptoms of dehydration requiring IV administration for adequate symptom relief.    Therapeutic Procedure - IV infusion.  Patient was given IV medication under my supervision due to signs and symptoms of possible  morbid disease.      Therapeutic Procedure - COVID-19 counseling.  Patient counseled to maintain social distance, wear a mask, and avoid mass gatherings or unnecessary exposures to others.  Hand hygiene was emphasized.      Consults:   (1) Ct was reviewed with the radiologist and neg for acute disease but says persistent atherosclerotic disease  (2) case d/w the hospitalist who accepted the pt in hand off      Impression:  1. Abdominal pain, acute    2. Hypovolemia      Disposition and Follow Up:  Admit    Scribe Signature   I, Lurena Nida, have acted as a Stage manager for patient Idonna Heeren on behalf of Dr. Crosby Oyster at 09/24/2023 at 6:59 AM. All documentation underwent a comprehensive review by the listed physician(s) and received their approval upon signing.      All scribe entries and documentation made by the scribe were entered at my direction.  I have reviewed this medical record and agree to the accuracy and completeness of the content entered by the scribe.  The documentation recorded by the scribe accurately reflects the service I personally performed and the decisions made by me.    Santa Genera., MD 8:23 AM 09/24/2023         Santa Genera., MD  09/24/23 1016       Santa Genera., MD  09/24/23 [phone]

## 2023-09-24 NOTE — ED Notes
Ordered meal tray.

## 2023-09-24 NOTE — ED Notes
[facility] NOTIFICATION 09/24/2023 06:48 Donna Gross, Donna Gross DOB: [birth year] MRN: [id number]    [name]:   HQI:[id number]  Account [id number]  Billing Account [id number]      Criteria Met      2 Visits in 30 Days    6 Visits in 180 Days    History of Sepsis Dx    Security and Safety  No Security Events were found.  ED Care Guidelines  There are currently no ED Care Guidelines for this patient. Please check your facility's medical records system.    Flags      History of Sepsis - Patient has received a diagnosis of Sepsis from an acute or post-acute setting. Apply appropriate clinical planning practices; to learn more visit [email or web address] / Attributed By: [employer] / Attributed On: 05/20/2023       [facility] enrolled with [facility] - Patient is currently enrolled in the Medi-Cal Enhanced Care Management Program with [facility]. Please reach out between the hours of 08:00 am and 05:00 pm Monday-Friday at [phone] to coordinate care. / Attributed By: [facility] / Attributed On: 09/03/2023       Prescription Drug Data  No Prescription Drug Data was found.    E.D. Visit Count (12 mo.)  Facility Visits   [facility] 3   [address] [place] 2   [facility] 2   Total 7   Note: Visits indicate total known visits.     Recent Emergency Department Visit Summary  Date Facility [facility] Type Diagnoses or Chief Complaint    Sep 24, 2023  [facility]  [place]     Sep 12, 2023  [name]  [place]  Chief Complaint: ABD PAIN/ VOMITTING / DIARRHEA    Aug 26, 2023  [facility]  [place]      1. Nausea with vomiting, unspecified      1. Unspecified abdominal pain      1. Abdominal Pain      3. Other chronic pain      4. Abnormal weight loss      5. Adult failure to thrive      Aug 10, 2023  [facility].  [place]      1. Unspecified abdominal pain      1. Abdominal Pain      2. Abnormal weight loss      3. Diarrhea, unspecified      May 22, 2023  [name]  [place]  Chief Complaint: CP/ABD PAIN    May 21, 2023  [name]  [place]      Dysphagia, unspecified      Chronic obstructive pulmonary disease, unspecified      Gastritis, unspecified, without bleeding      Heart failure, unspecified      Other long term (current) drug therapy      Major depressive disorder, single episode, unspecified      Acquired absence of other specified parts of digestive tract      Anxiety disorder, unspecified      Weakness      Anemia, unspecified      May 19, 2023  [facility]  [place]  [place]  Chief Complaint: SOB  Recent Inpatient Visit Summary  Date Facility [facility] Type Diagnoses or Chief Complaint    Aug 28, 2023  [facility]  [place]      1. Abnormal weight loss      2. Nicotine dependence, unspecified, uncomplicated      3. Unspecified abdominal pain      4. Other chronic pain      5. Adult failure to thrive      6. Nausea with vomiting, unspecified      May 22, 2023  [name]  [place]  Medical Surgical  Chief Complaint: Colitis    May 19, 2023  [facility]  [name]  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  [place]  Cath Lab      1. Generalized abdominal pain      1. Noninfective gastroenteritis and colitis, unspecified      2. Other specified postprocedural states      3. Vascular disorder of intestine, unspecified      4. Tobacco use      5. Diarrhea, unspecified      6. Nausea with vomiting, unspecified      8. Chronic vascular disorders of intestine      9. Unspecified severe protein-calorie malnutrition      10. Anemia, unspecified      Apr 23, 2023  [facility]  [place]      1. Noninfective gastroenteritis and colitis, unspecified      1. Generalized abdominal pain      2. Other specified postprocedural states      3. Vascular disorder of intestine, unspecified      4. Tobacco use      5. Diarrhea, unspecified      6. Nausea with vomiting, unspecified      8. Chronic vascular disorders of intestine      9. Unspecified severe protein-calorie malnutrition      10. Anemia, unspecified      Feb 02, 2023  [facility]  [place]  CT Scan      1. Dehydration      2. Generalized abdominal pain      3. Abnormal weight loss      4. Unspecified abdominal pain      5. Acute kidney failure, unspecified      6. Diarrhea, unspecified      7. Nausea with vomiting, unspecified        Care Team  [facility] Phone Fax Service Dates   PEREZ, [name], M.D. Family Medicine   Current      [facility]  This patient has registered at the [facility] Emergency Department  For more information visit: [email or web address]   PLEASE NOTE:     1.   Any care recommendations and other clinical information  are provided as guidelines or for historical purposes only, and providers should exercise their own clinical judgment when providing care.    2.   You may only use this information for purposes of treatment, payment or health care operations activities, and subject to the limitations of applicable [facility] Policies.    3.   You should consult directly with the organization that provided a care guideline or other clinical history with any questions about additional information or accuracy or completeness of information provided.    ? 2024 [facility] - [email or web address]

## 2023-09-24 NOTE — H&P
[facility]PART[facility] OF [facility]IC[facility]   INPATIENT NOTE TEMPLATE   DIVISION OF HOSPITAL MEDICINE  INITIAL HISTORY AND PHYSICAL    PATIENT: Donna Gross               MRN: [id number]   PCP: Kelle Darting, MD  ADMISSION DATE:   HOSPITAL DAY: 0  DATE OF SERVICE: 09/24/2023  CC: Abdominal Pain (Abdominal pain, nausea, vomiting, and diarrhea for several days.)    HPI   Donna Gross is a 57 y.o. female hx of intestinal stoma prolapse, duodenal ucler, peptic ulcer disease, hypotension, colitis, superior mesenteric artery stenosis, depression, emphysema, gastritis, GERD, pancreatitis s/p lap cholecystectomy/ extensive adhesiolysis (03/24/22), and s/p stent placement (04/29/2023), presenting with acute worsening of chronic abdominal pain.     States pain feels like the ''same old pain'', has had it for the last few days. Had associated nausea/vomiting as well, but currently not having any nausea. At home, she took oxycodone q4h around the clock and experienced bloating. Unknown last BM.  She also expresses that she lives with her daughter and her mom, and of late has had increasing stress from daughter. No chest pain, no SOB.     Denies fever/chills, dysuria, diarrhea. Denies recent travel, sick contacts, or URI symptoms.     ER course:   Afebrile, VSS. CT abdomen with contrast shows severe atherosclerotic calcifications and moderate stool throughout the colon; no e/o bowel wall thickening or vascular occlusion to suggest acute ischemia.   Received the following medications:   Benadryl 50mg  IV x1  Famotidine 20mg  IV x1  Dilaudid 1mg  IV x1  Zofran 4mg  x 1  NS bolus x 1L       Medications   Objective (click to expand/collapse)   Scheduled:  acetaminophen, 1,000 mg, Intravenous, Once    Infusions:      PRN:         Physical Exam     Vital Signs:   Temp:  [36.1 ?C (97 ?F)] 36.1 ?C (97 ?F)  Heart Rate:  [80] 80  Resp:  [14] 14  BP: (128)/(71) 128/71  NBP Mean:  [90] 90  SpO2:  [96 %] 96 %       I&O's:   No intake/output data recorded. Oxygen Support:  Oxygen Therapy  SpO2: 96 %           Weight for the past 720 hrs (Last 4 readings):   Weight   09/24/23 0650 49 kg (108 lb 0.4 oz)       System Normal Findings Abnormal Findings   General []  Normal general appearance   []  In acute distress  [x]  Chronically ill appearing  []  Sedated           []  Non Verbal  []  Other:   HENMT/Neck []  Oropharynx clear and moist  []  Normal JVP [x]  Dry mucous membranes  []  JVP ( cm)  []  Other:   Resp [x]  CTA B/L []  Intubated                 []  On Supp O2 ()  []  Wheezing ()  []  Rales ()   []  Crackles ()  []  Other:   CV [x]  Normal rhythm/rate  [x]  No murmurs  [x]  No lower extremity edema  []  Normal PMI   Normal pulses:   []  Radial []  Femoral  []  Pedal []   Abnormal rhythm ()  []   Murmur ()  []  S3    []  S4  Abnormal Pulses:  []   Radial ()  []  Femoral ()   []  DP ()  []  PT ()  []  Lower extremity edema       []  R ()   []  L ()  []  Bilateral ()  []  Other:   GI []  Soft  []  No tenderness to palpation  []  No rebound/guarding []  Tenderness ()  []  Fluid Wave  []  Hepatomegaly  Other: abd soft; well healed surgical sites noted, abd round, bloated, +bowel sounds    MSK [x]  Normal strength/tone []  Other:    Skin [x]  No rash []  Other:   Neuro [x]  AO x 3  []  Normal strength  []  Normal sensation  []  Normal reflexes  []  Normal cerebellar exam []  AO x 0       []  AO x 1     []  AO x 2  Abnormal neuro exam:  []  Abnormal strength  []  Abnormal sensation  []  Abnormal reflexes  []  Abnormal cerebellar exam       Labs/Studies     Labs  Objective (click to expand/collapse)   Last CBC:   Results for orders placed or performed during the hospital encounter of 09/24/23   CBC   Result Value Ref Range    White Blood Cell Count 7.00 4.16 - 9.95 x10E3/uL    Red Blood Cell Count 3.78 (L) 3.96 - 5.09 x10E6/uL    Hemoglobin 10.6 (L) 11.6 - 15.2 g/dL    Hematocrit 30.8 (L) 34.9 - 45.2 %    Mean Corpuscular Volume 91.0 79.3 - 98.6 fL    Mean Corpuscular Hemoglobin 28.0 26.4 - 33.4 pg    MCH Concentration 30.8 (L) 31.5 - 35.5 g/dL    Red Cell Distribution Width-SD 55.6 (H) 36.9 - 48.3 fL    Red Cell Distribution Width-CV 16.6 (H) 11.1 - 15.5 %    Platelet Count, Auto 447 (H) 143 - 398 x10E3/uL    Mean Platelet Volume 9.0 (L) 9.3 - 13.0 fL    Nucleated RBC%, automated 0.0 No Ref. Range %    Absolute Nucleated RBC Count 0.00 0.00 - 0.00 x10E3/uL    Neutrophil Abs (Prelim) 4.57 See Absolute Neut Ct. x10E3/uL   Differential, Automated   Result Value Ref Range    Neutrophil Percent, Auto 65.3 No Ref. Range %    Lymphocyte Percent, Auto 25.1 No Ref. Range %    Monocyte Percent, Auto 6.9 No Ref. Range %    Eosinophil Percent, Auto 2.1 No Ref. Range %    Basophil Percent, Auto 0.3 No Ref. Range %    Immature Granulocytes% 0.3 No Reference Range %    Absolute Neut Count 4.57 1.80 - 6.90 x10E3/uL    Absolute Lymphocyte Count 1.76 1.30 - 3.40 x10E3/uL    Absolute Mono Count 0.48 0.20 - 0.80 x10E3/uL    Absolute Eos Count 0.15 0.00 - 0.50 x10E3/uL    Absolute Baso Count 0.02 0.00 - 0.10 x10E3/uL    Absolute Immature Gran Count 0.02 0.00 - 0.04 x10E3/uL   CBC & Plt & Diff    Narrative    The following orders were created for panel order CBC & Plt & Diff.  Procedure                               Abnormality         Status                     ---------                               -----------         ------  JWJ[[id number]]                          Abnormal            Final result               Differential, Automated[[id number]]                          Final result                 Please view results for these tests on the individual orders.      Results for orders placed or performed during the hospital encounter of 08/26/23   CBC   Result Value Ref Range    White Blood Cell Count 5.40 4.16 - 9.95 x10E3/uL    Red Blood Cell Count 3.58 (L) 3.96 - 5.09 x10E6/uL    Hemoglobin 10.1 (L) 11.6 - 15.2 g/dL    Hematocrit 62.1 (L) 34.9 - 45.2 %    Mean Corpuscular Volume 93.6 79.3 - 98.6 fL    Mean Corpuscular Hemoglobin 28.2 26.4 - 33.4 pg    MCH Concentration 30.1 (L) 31.5 - 35.5 g/dL    Red Cell Distribution Width-SD 54.7 (H) 36.9 - 48.3 fL    Red Cell Distribution Width-CV 15.9 (H) 11.1 - 15.5 %    Platelet Count, Auto 313 143 - 398 x10E3/uL    Mean Platelet Volume 8.6 (L) 9.3 - 13.0 fL    Nucleated RBC%, automated 0.0 No Ref. Range %    Absolute Nucleated RBC Count 0.00 0.00 - 0.00 x10E3/uL     Last CMP:   Results for orders placed or performed during the hospital encounter of 04/23/23   Comprehensive Metabolic Panel   Result Value Ref Range    Sodium 139 135 - 146 mmol/L    Potassium 3.8 3.6 - 5.3 mmol/L    Chloride 105 96 - 106 mmol/L    Total CO2 23 20 - 30 mmol/L    Anion Gap 11 8 - 19 mmol/L    Glucose 113 (H) 65 - 99 mg/dL    Creatinine 3.08 6.57 - 1.30 mg/dL    Estimated GFR 70 See GFR Additional Information mL/min/1.5m2    GFR Additional Information See Comment     Urea Nitrogen 8 7 - 22 mg/dL    Calcium 9.4 8.6 - 84.6 mg/dL    Total Protein 6.1 6.1 - 8.2 g/dL    Albumin 3.8 (L) 3.9 - 5.0 g/dL    Bilirubin,Total 0.2 0.1 - 1.2 mg/dL    Alkaline Phosphatase 67 37 - 113 U/L    Aspartate Aminotransferase 18 13 - 62 U/L    Alanine Aminotransferase 7 (L) 8 - 70 U/L     Last LFT:   Lab Results   Component Value Date/Time    TOTPRO 6.4 08/27/2023 04:36 AM    ALBUMIN 4.3 09/24/2023 07:36 AM    BILITOT <0.2 09/10/2023 07:37 AM    ALKPHOS 96 09/24/2023 07:36 AM    AST 18 09/24/2023 07:36 AM    ALT 11 09/24/2023 07:36 AM     Last Hgb A1C:   Lab Results   Component Value Date/Time    HGBA1C 5.4 05/12/2023 06:58 AM     Last TSH: No results found for: ''TSH''       Micro  Objective (click to expand/collapse)   No results found for this or any  previous visit (from the past 168 hour(s)).       Radiology  Objective (click to expand/collapse)   CT abd+pelvis w contrast    Result Date: 09/24/2023  CT ABD+PELVIS W CONTRAST INDICATION: 74 years years of age Female with guarding with ttp on r side of abd, pt reports pain 11/10 and significantly worse than prior episodes COMPARISON: 09/10/2023 TECHNIQUE:  On a multirow-detector scanner, a volumetric post IV contrast scan is reconstructed to 5 mm images through the abdomen and pelvis. DOSE ESTIMATE: The patient received the following exposure event(s) during this study, and the dose reference values for each are as shown (CTDIvol in mGy, DLP in mGy-cm). Note that the values are not patient dose but numbers generated from scan acquisition factors based on 32 cm (L) and/or 16 cm (S) phantoms and may substantially under-estimate or over-estimate actual patient dose based on patient size and other factors. PreMonitoring, CTDI(L): 0.8, DLP: 0.8;Monitoring, CTDI(L): 1.5, DLP: 1.5;DE_Abd/Pel, CTDI(L): 5.6, DLP: 248.1 FINDINGS: Lung bases: Mild bronchiectasis.Marland Kitchen Heart: There are coronary artery calcifications. Vasculature:  Atherosclerotic vascular calcifications are seen in the abdominal aorta and its branches. Again, there is a stent identified at the origin of the SMA. Lymph nodes: No abnormally enlarged lymph nodes. Liver: Normal size.  Normal liver attenuation. No CT evidence of a suspicious mass. There is mild intra-hepatic biliary ductal dilation, likely postcholecystectomy state. Gallbladder: The gallbladder is surgically absent. Spleen: Normal. Pancreas: Slightly atrophic pancreas with duct upper limits normal. Adrenals: Normal. Kidneys: Normal, no hydronephrosis. Bladder: Normal. Reproductive: The uterus is normal.  No adnexal lesions seen. Stomach/Small Bowel/Colon: The stomach is normal. The visualized bowel segments are normal in caliber.  No bowel wall thickening. Moderate amount of feces throughout the colon. No pericolonic inflammatory changes. Evidence of prior bowel resection in the left upper quadrant status post appendectomy. Peritoneum: No ascites or free air. Bones: There are moderate degenerative changes of the spine. Anterolisthesis of L5 on S1, grade 1 Soft tissues: The soft tissues are unremarkable.     IMPRESSION: Redemonstrated severe atherosclerotic calcifications. No evidence of bowel wall thickening or vascular occlusion to suggest acute ischemia. Signed by: Vanessa Barbara   09/24/2023 9:49 AM        Additional studies   Objective (click to expand/collapse)   None           I reviewed these notes that directly relate to the patient's acute and/or chronic medical problems with documentation of the salient findings if any:  Inpatient notes:      Last Emergency Medicine Note           09/24/23 1035 ED Provider Notes addendum by Santa Genera., MD                 A&P   Chloie Loney is a 57 y.o. female hx of intestinal stoma prolapse, duodenal ucler, peptic ulcer disease, hypotension, colitis, superior mesenteric artery stenosis, depression, emphysema, gastritis, GERD, pancreatitis s/p lap cholecystectomy/ extensive adhesiolysis (03/24/22), and s/p stent placement (04/29/2023), presenting with acute worsening of chronic abdominal pain x few days.     #Acute on chronic abdominal pain, POA  #SMA stenosis, chronic, POA  #Celiac stenosis, POA  #CT evidence of CAD, POA  #s/p angio and stenting of SMA of 04/29/23  #Multiple abdominal surgeries c/b adhesions, chronic, POA  #GERD, chronic, POA  Has history of long standing abdominal pain x 1 year - admitted to [facility] with similar complaints in past. No acute pathology noted on CT  abdomen, but does show moderate stool throughout the colon. Etiology of pain likely multifactorial from extensive abd surgeries, GERD, fibromyalgia, opioid dependence, constipation, and pancreatic insufficiency.   - pain regimen:    - tylenol IV 1g Q8H x 24 hours    - oxycodone 04/29/14 mg q4h prn mild-mod-severe pain    - duloxetine 60mg  daily    - ranolazine 500mg  bid               - pregabalin 25mg  bid                - hyoscyamine 125mg  q4h prn    - topicals: lidocaine patch, analgesic balm    - AVOID IV opiates   - atorvastatin 40mg  at bedtime   - SMA stenting: aspirin 81mg  daily and plavix 75mg  daily   - consider chronic pain consult if pain not controlled within 24-48 hours    #Constipation, chronic, intermittent, POA  #Nausea/vomiting, chronic, POA  #Hx of pancreatic insufficiency, chronic, POA  Has chronic complaints of n/v/d. Has stool studies earlier this year, negative. Unable to tolerate colonoscopy, last EGD 01/28/23 showed mild gastric erythma, otherwise normal.   - bowel regimen: miralax daily, senna prn, bisacodyl supp, magnesium hydroxide prn   - consider enema if no BM from current bowel regimen  - creon 36,000 units of lipase TID  - nausea: zofran 4mg  q8h prn     #Poor PO intake, chronic, POA  #Acute weight loss, acute on chronic POA  #Severe muscle loss with cachexia due to severe malnutrition, POA  Reports recent weight loss. BMI 18  - nutrition consult  - regular diet     #Normocytic anemia, chronic, POA  At baseline 9-10. Currently at 10. Anemia can be 2nd to nutritional deficiencies from poor PO intake. Recent iron 35, ferritin 15.   - trend daily CBC    #Emphysema, POA  #Tobacco use, POA  Previous PPD smoker from age 29 and reduced over last few years to 1 cigarette daily.   - nicotine patch 7 mg   - fluticasone-salmeterol inhaler 1 puff BID, albuterol PRN  - nicotine cessation      #Anxiety, chronic, POA   #Major depression, chronic, POA  #Fibromyalgia, chronic, POA  #Insomnia, chronic, POA   States has increased stress from daughter. Denies SI/HI.   - continue home seroquel100 mg BID   - continue home duloxetine 60 mg daily   - melatonin 5mg  at bedtime prn insomnia     # IP Checklist  - Diet: No diet orders on file  - VTE PPX: SCDs  - LDA:   Peripheral IV 22 G Anterior;Left Forearm (0)    Advance Care Planning:   Prior,  Primary Emergency Contact: [name],[name], Mobile Phone: [phone]    Disposition:  Expected post-hospitalization disposition will be to Home    Discussed with attending, Dr. Larry Sierras.     Author:    Dinah Beers, NP   Loma Sender, NP   09/24/2023 10:42 AM    Attending Addendum:   I agree with the NP documentation above. I have performed the substantive portion of the exam including obtaining a separate history, performing a separate physical exam, and have formulated the plan of care above.    Author:    Drue Flirt. Shawnie Dapper  09/24/2023 3:24 PM     MDM:   I have personally reviewed and/or ordered lab tests and imaging, reviewed consult notes where applicable and discussed with external physicians in the care  of this patient as detailed above  Billing by Medical Decision Making meets requirements for 2/3 areas - Problem, Data, Risk:  Problem: MODERATE complexity - 2 or more stable chronic illnesses were addressed during this encounter.  Data Complexity: MODERATE complexity - review tests, order tests, review notes, independent historian (3/4) OR interpret test OR discuss with specialist [1/3 categories].     I personally reviewed labs, imaging, and other relevant diagnostics, spent time face-to-face counseling patient and/or coordinating care.     I reviewed these notes that directly relate to the patient's acute and/or chronic medical problems with documentation of the salient findings if any:  Inpatient notes:      Last Emergency Medicine Note           09/24/23 1035 ED Provider Notes addendum by Santa Genera., MD

## 2023-09-24 NOTE — ED Notes
Pt ambulatory with steady gate. NAD. Pain is improved since med administration 5/10

## 2023-09-25 LAB — Basic Metabolic Panel
CREATININE: 0.88 mg/dL (ref 0.60–1.30)
SODIUM: 137 mmol/L (ref 135–146)

## 2023-09-25 LAB — CBC: PLATELET COUNT, AUTO: 363 10*3/uL (ref 143–398)

## 2023-09-25 MED ADMIN — DULOXETINE HCL 30 MG PO CPEP: 60 mg | ORAL | @ 17:00:00 | Stop: 2023-09-27 | NDC 60687073411

## 2023-09-25 MED ADMIN — CHOLESTYRAMINE 4 G PO PACK: 4 g | ORAL | @ 19:00:00 | Stop: 2023-09-27 | NDC 27241013421

## 2023-09-25 MED ADMIN — PANTOPRAZOLE SODIUM 40 MG PO TBEC: 40 mg | ORAL | @ 17:00:00 | Stop: 2023-09-25 | NDC 60687073609

## 2023-09-25 MED ADMIN — ACETAMINOPHEN 10 MG/ML IV SOLN: 1000 mg | INTRAVENOUS | @ 03:00:00 | Stop: 2023-09-26 | NDC 00264410090

## 2023-09-25 MED ADMIN — PREGABALIN 25 MG PO CAPS: 25 mg | ORAL | @ 17:00:00 | Stop: 2023-09-27 | NDC 60687047311

## 2023-09-25 MED ADMIN — PANCRELIPASE (LIP-PROT-AMYL) 36000-114000 UNITS PO CPEP: 36000 [IU] | ORAL | @ 17:00:00 | Stop: 2023-09-27 | NDC 00032301613

## 2023-09-25 MED ADMIN — CLOPIDOGREL BISULFATE 75 MG PO TABS: 75 mg | ORAL | @ 17:00:00 | Stop: 2023-09-27

## 2023-09-25 MED ADMIN — ASPIRIN 81 MG PO TBEC: 81 mg | ORAL | @ 17:00:00 | Stop: 2023-09-27 | NDC 63739021202

## 2023-09-25 MED ADMIN — SODIUM CHLORIDE 0.9 % IV BOLUS: 1000 mL | INTRAVENOUS | Stop: 2023-09-25 | NDC 00338004904

## 2023-09-25 MED ADMIN — FLUTICASONE-SALMETEROL 250-50 MCG/ACT IN AEPB: 1 | RESPIRATORY_TRACT | @ 18:00:00 | Stop: 2023-09-27

## 2023-09-25 MED ADMIN — CHOLESTYRAMINE 4 G PO PACK: 4 g | ORAL | @ 17:00:00 | Stop: 2023-09-27

## 2023-09-25 MED ADMIN — ACETAMINOPHEN 10 MG/ML IV SOLN: 1000 mg | INTRAVENOUS | @ 10:00:00 | Stop: 2023-09-26 | NDC 00264410090

## 2023-09-25 MED ADMIN — QUETIAPINE FUMARATE 100 MG PO TABS: 100 mg | ORAL | @ 04:00:00 | Stop: 2023-09-27 | NDC 60687034911

## 2023-09-25 MED ADMIN — THIAMINE HCL 100 MG PO TABS (MULTI GPI): 100 mg | ORAL | @ 21:00:00 | Stop: 2023-09-27 | NDC 77333093425

## 2023-09-25 MED ADMIN — PREGABALIN 25 MG PO CAPS: 25 mg | ORAL | @ 04:00:00 | Stop: 2023-09-27 | NDC 60687047311

## 2023-09-25 MED ADMIN — OXYCODONE HCL 5 MG PO TABS: 15 mg | ORAL | @ 06:00:00 | Stop: 2023-10-01 | NDC 68084035411

## 2023-09-25 MED ADMIN — ACETAMINOPHEN 10 MG/ML IV SOLN: 1000 mg | INTRAVENOUS | @ 18:00:00 | Stop: 2023-09-26 | NDC 00264410090

## 2023-09-25 MED ADMIN — NICOTINE 7 MG/24HR TD PT24: 7 mg | TRANSDERMAL | @ 18:00:00 | Stop: 2023-09-27

## 2023-09-25 MED ADMIN — PANTOPRAZOLE SODIUM 40 MG PO TBEC: 40 mg | ORAL | @ 18:00:00 | Stop: 2023-09-27

## 2023-09-25 MED ADMIN — LIDOCAINE 5 % EX PTCH: 1 | TRANSDERMAL | @ 18:00:00 | Stop: 2023-09-27

## 2023-09-25 MED ADMIN — ENOXAPARIN SODIUM 40 MG/0.4ML IJ SOSY: 40 mg | SUBCUTANEOUS | @ 22:00:00 | Stop: 2023-10-25

## 2023-09-25 MED ADMIN — SENNOSIDES 8.6 MG PO TABS: 1 | ORAL | @ 19:00:00 | Stop: 2023-09-27 | NDC 00904725261

## 2023-09-25 MED ADMIN — NICOTINE 7 MG/24HR TD PT24: 7 mg | TRANSDERMAL | @ 19:00:00 | Stop: 2023-10-24 | NDC 00536589488

## 2023-09-25 MED ADMIN — OXYCODONE HCL 5 MG PO TABS: 15 mg | ORAL | @ 12:00:00 | Stop: 2023-09-27 | NDC 68084035411

## 2023-09-25 MED ADMIN — OXYCODONE HCL 5 MG PO TABS: 5 mg | ORAL | @ 08:00:00 | Stop: 2023-09-25 | NDC 68084035411

## 2023-09-25 MED ADMIN — QUETIAPINE FUMARATE 100 MG PO TABS: 100 mg | ORAL | @ 17:00:00 | Stop: 2023-09-27 | NDC 60687034911

## 2023-09-25 MED ADMIN — PANCRELIPASE (LIP-PROT-AMYL) 36000-114000 UNITS PO CPEP: 36000 [IU] | ORAL | Stop: 2023-10-24 | NDC 00032301613

## 2023-09-25 MED ADMIN — ATORVASTATIN CALCIUM 20 MG PO TABS: 40 mg | ORAL | @ 04:00:00 | Stop: 2023-09-27 | NDC 00904629161

## 2023-09-25 MED ADMIN — RANOLAZINE ER 500 MG PO TB12: 500 mg | ORAL | @ 04:00:00 | Stop: 2023-09-27

## 2023-09-25 MED ADMIN — POLYETHYLENE GLYCOL 3350 17 G PO PACK: 17 g | ORAL | @ 17:00:00 | Stop: 2023-09-25 | NDC 60687043199

## 2023-09-25 MED ADMIN — OXYCODONE HCL 5 MG PO TABS: 15 mg | ORAL | @ 18:00:00 | Stop: 2023-09-27 | NDC 68084035411

## 2023-09-25 MED ADMIN — CHOLESTYRAMINE 4 G PO PACK: 4 g | ORAL | @ 04:00:00 | Stop: 2023-09-27 | NDC 68382052860

## 2023-09-25 MED ADMIN — NON FORMULARY: ORAL | @ 04:00:00 | Stop: 2023-10-24

## 2023-09-25 MED ADMIN — SIMETHICONE 80 MG PO CHEW: 80 mg | ORAL | @ 18:00:00 | Stop: 2023-09-27 | NDC 77333081225

## 2023-09-25 MED ADMIN — THERA M PLUS PO TABS: 1 | ORAL | @ 21:00:00 | Stop: 2023-09-27 | NDC 77333086125

## 2023-09-25 MED ADMIN — FLUTICASONE-SALMETEROL 250-50 MCG/ACT IN AEPB: 1 | RESPIRATORY_TRACT | @ 06:00:00 | Stop: 2023-10-02

## 2023-09-25 MED ADMIN — PANCRELIPASE (LIP-PROT-AMYL) 36000-114000 UNITS PO CPEP: 36000 [IU] | ORAL | @ 21:00:00 | Stop: 2023-09-27 | NDC 00032301613

## 2023-09-25 MED ADMIN — CHOLECALCIFEROL 25 MCG (1000 UT) PO TABS: 50 ug | ORAL | @ 17:00:00 | Stop: 2023-09-27

## 2023-09-25 MED ADMIN — RANOLAZINE ER 500 MG PO TB12: 500 mg | ORAL | @ 17:00:00 | Stop: 2023-09-27

## 2023-09-25 NOTE — Consults
CLINICAL SOCIAL WORKER PROGRESS NOTE      Admit Date:    Date of CSW Assessment: 09/25/2023     REFERRAL INFORMATION:    RN endorsed consult life stressor w/daughter & mom, need psychosocial support    CLINICAL ASSESSMENT:   SW contact Rn who reports pt a/ox4 and no current signs of distress.     15:30- SW met with pt introduced self, role and purpose for visit. SW inquired about any family turmoil. Pt presented calm, cooperative and easy to engage. Pt denies any family stressors and declined counseling resources. Pt endorsed she is mostly concerned about her health at this time and would reach out to staff in the future if she would like any further support. Pt denies abuse of any kind of feeling unsafe to return home pending d/c.          Interdisciplinary rounds were conducted with the multidisciplinary team including the clinical social worker and nurse case manager. The patient's plan of care and discharge planning were discussed and formulated based on the patient's specific needs.    Projected Date of Discharge:     SDOH INTERVENTIONS:        Leda Quail, LCSW,  09/25/2023  Pager [phone]

## 2023-09-25 NOTE — Nursing Note
Patient received from [facility] ED at time of 1620, originally admitted from home, accompanied by  transport.  On arrival, patient oriented x4, able to verbalize needs.  Vital signs noted Blood pressure 114/70, pulse 68, temperature 36.5 ?C (97.7 ?F), temperature source Oral, resp. rate 18, height 1.676 m (5' 6''), weight 50.3 kg (111 lb), SpO2 95%.  Patient is not on cardiac monitor.   []  Monitor room notified (if applicable).  Patient is not on continuous pulse oximetry.  []  Monitor room notified (if applicable).  Patient is on room air  Skin assessment done with 2nd RN Teena, noted intact  Patient is not on isolation.    Patient has no home medications brought into hospital.    The patient was oriented/educated to call light, bed, and surroundings.  Call light in reach.  Fall prevention education provided.    Established phone updates with Roslynn Amble daily at 1400 hours.

## 2023-09-25 NOTE — Other
Patient's Clinical Goal:   Clinical Goal(s) for the Shift: VSS, promote rest/ comfort, fall precautions, pain management  Identify possible barriers to advancing the care plan: N/a  Stability of the patient: Moderately Stable - low risk of patient condition declining or worsening   Progression of Patient's Clinical Goal:     Plan of Care   Pain management  Fall precautions  Nutrition consult, pending    Notable/significant events and interventions  Pain unrelieved, MD notified throughout night. See chart for orders.    Additional Notes   Patient is oriented x 4  Patient c/o of abdominal  pain. Pain Managed with PRN oxycodone, routine tylenol, extra doses of oxycodone.  BMAT 4. Pt ambulates with supervision.   Gi: Reg diet  Gu: Continent    Review of Discharge Planning  Plan/goals for discharge: Pending medical stability  Barriers:Other     Handoff Checklist - Completed at every change of shift  Central Line  no  Foley Catheter  no  High-risk drugs independently verified yes  Sepsis screen reviewed and completed with 2nd RN?  yes  Pressure injury  no  RN/MD Rounding? n/a

## 2023-09-25 NOTE — Nursing Note
A/O X 4  Not in respiratory distress  VS stable.  Afebrile  Still claiming abdominal pain-oxycodone, Tylenol IV  BMAT 3  Report given to RN AMOR as transfer of care.    Milly Jakob, RN BSN PCCN

## 2023-09-25 NOTE — Progress Notes
INTERNAL MEDICINE - INPATIENT PROGRESS NOTE    ADMISSION DATE:     DATE OF SERVICE: 09/25/2023  HOSPITAL DAY: 0    PRINCIPLE PROBLEM: <principal problem not specified>    CC: Abdominal Pain (Abdominal pain, nausea, vomiting, and diarrhea for several days.)    INTERVAL EVENTS AND REVIEW OF SYSTEMS:  Overnight: pt reported severe pain despite oxycodone. Requested IV dilaudid but ordered for an additional 5 mg of oxy. At 3:47am night team got a page that shes angry we are not giving IV pain meds  No charted BM overnight.  Patient reported that prior to admission she had a light brown stool but has not yet had a bowel movement in the hospital.  She reports that she comes to the hospital for IV Dilaudid, as she has oral pain medications at home.  We discussed her CT findings which overall were negative for an acute process but did show moderate stool burden.  She reports that she has had this pain ever since her gallbladder was removed and reports that she was able to eat 1/4 of a sandwich last night.  Patient reported bloating. Patient became upset when we discussed that no IV opiates would be offered today given that the leading diagnosis is constipation and that IV opiates can worsen constipation, that we can start PRN simethicone, increase her PPI and increase her bowel regimen.    VITALS:  Temp:  [36.6 ?C (97.9 ?F)-36.8 ?C (98.2 ?F)] 36.6 ?C (97.9 ?F)  Heart Rate:  [59-85] 73  Resp:  [16-17] 17  BP: (84-121)/(59-88) 93/59  NBP Mean:  [70-95] 70  SpO2:  [91 %-95 %] 92 %     Weight: 47.4 kg (104 lb 8 oz) Oxygen Therapy  SpO2: (!) 92 %  O2 Device: None (Room air)     INTAKE AND OUTPUT:  I/O last 2 completed shifts:  In: 1220 [P.O.:120; IV Piggyback:1100]  Out: -     CURRENT INPATIENT MEDICATIONS:   sodium chloride Stopped (09/24/23 1310)     acetaminophen, 1,000 mg, Intravenous, Q8H  aspirin, 81 mg, Oral, Daily  atorvastatin, 40 mg, Oral, QHS  cholestyramine, 4 g, Oral, TID  clopidogrel, 75 mg, Oral, Daily  DULoxetine, 60 mg, Oral, Daily  fluticasone-salmeterol, 1 puff, Inhalation, BID  lidocaine, 1 patch, Transdermal, Q24H  nicotine patch, 7 mg, Transdermal, Daily  non formulary, 500 mg, Oral, BID  pancrelipase (Lip-Prot-Amyl), 36,000 units of lipase, Oral, TID w/meals  pantoprazole, 40 mg, Oral, Daily  polyethylene glycol, 17 g, Oral, Daily  pregabalin, 25 mg, Oral, BID  QUEtiapine, 100 mg, Oral, BID  cholecalciferol, 50 mcg, Oral, Daily  PRNs: albuterol, analgesic balm, bisacodyl, calcium carbonate, hyoscyamine, magnesium hydroxide, melatonin oral/enteral/sublingual, ondansetron injection/IVPB **OR** ondansetron, oxyCODONE **OR** oxyCODONE **OR** oxyCODONE, senna    PHYSICAL EXAM:   General: alert, in no distress, breakfast tray at bedside with pancakes, partially eaten, irritable   Chest: symmetrical chest rise and fall  Lungs: normal work of breathing  Abdomen: flat, linear prior surgical scare present  Neuro: AxOx3, moving all extremities independently  Psych: irritable    LABS:  I have reviewed the following information from the last 24 hours: allied health and treating physician notes, imaging, labs and microbiology data and cardiac studies and telemetry data (if on monitor)    Lab Results   Component Value Date    WBC 7.45 09/25/2023    HGB 9.5 (L) 09/25/2023    HCT 31.2 (L) 09/25/2023    MCV 92.0 09/25/2023  PLT 363 09/25/2023     Lab Results   Component Value Date    NA 137 09/25/2023    K 4.2 09/25/2023    CL 104 09/25/2023    CO2 21 09/25/2023    BUN 13 09/25/2023    CREAT 0.88 09/25/2023     Lab Results   Component Value Date    ALT 11 09/24/2023    AST 18 09/24/2023    ALKPHOS 96 09/24/2023    BILITOT <0.2 09/10/2023     Lab Results   Component Value Date    INR 1.0 09/10/2023    INR 0.90 03/09/2021    PT 13.0 09/10/2023    PT 12.3 03/09/2021       IMAGING:  CT abd+pelvis w contrast   Final Result by Marigene Ehlers., MD (10/04 1610)   IMPRESSION:   Redemonstrated severe atherosclerotic calcifications. No evidence of bowel wall thickening or vascular occlusion to suggest acute ischemia.            Signed by: Vanessa Barbara   09/24/2023 9:49 AM        CT abd+pelvis w contrast    Result Date: 09/24/2023  IMPRESSION: Redemonstrated severe atherosclerotic calcifications. No evidence of bowel wall thickening or vascular occlusion to suggest acute ischemia. Signed by: Vanessa Barbara   09/24/2023 9:49 AM        ASSESSMENT:  Donna Gross is a 57 y.o. female with intestinal stoma prolapse, duodenal ucler, peptic ulcer disease, hypotension, colitis, superior mesenteric artery stenosis, depression, emphysema, gastritis, GERD, pancreatitis s/p lap cholecystectomy/ extensive adhesiolysis (03/24/22), and s/p stent placement (04/29/2023), presenting with acute worsening of chronic abdominal pain x few days.      #Acute on chronic abdominal pain, POA  #SMA stenosis, chronic, POA  #Celiac stenosis, POA  #CT evidence of CAD, POA  #s/p angio and stenting of SMA of 04/29/23  #Multiple abdominal surgeries c/b adhesions, chronic, POA  #GERD, chronic, POA  Has history of long standing abdominal pain x 1 year - admitted to [facility] with similar complaints in past. No acute pathology noted on CT abdomen, but does show moderate stool throughout the colon. Pancreas mildly atrophic, lipase negative not consistent with pancreatitis, CT w/o mention of SBO. Etiology of pain likely multifactorial from extensive abd surgeries, GERD, fibromyalgia, opioid dependence, constipation, and pancreatic insufficiency.   - pain regimen:                - tylenol IV 1g Q8H x 24 hours                - oxycodone 04/29/14 mg q4h prn mild-mod-severe pain                - duloxetine 60mg  daily                - ranolazine 500mg  bid               - pregabalin 25mg  bid                - hyoscyamine 125mg  q4h prn                - topicals: lidocaine patch, analgesic balm                - AVOID IV opiates   - atorvastatin 40mg  at bedtime   - SMA stenting: aspirin 81mg  daily and plavix 75mg  daily   - bowel regimen: continue miralax once daily, schedule senna daily  -  start simethicone QID PRN.   - increase PPI to BID     #Constipation, chronic, intermittent, POA  #Nausea/vomiting, chronic, POA  #Hx of pancreatic insufficiency, chronic, POA  Has chronic complaints of n/v/d. Has stool studies earlier this year, negative. Unable to tolerate colonoscopy, last EGD 01/28/23 showed mild gastric erythma, otherwise normal.   - bowel regimen: miralax daily, senna daily, bisacodyl supp, magnesium hydroxide prn   - consider enema if no BM from current bowel regimen  - creon 36,000 units of lipase TID  - nausea: zofran 4mg  q8h prn      #Poor PO intake, chronic, POA  #Acute weight loss, acute on chronic POA  #Severe muscle loss with cachexia due to severe malnutrition, POA  Reports recent weight loss. BMI 18  - nutrition consult  - regular diet      #Normocytic anemia, chronic, POA  At baseline 9-10. Currently at 10. Anemia can be 2nd to nutritional deficiencies from poor PO intake. Recent iron 35, ferritin 15.   - trend daily CBC     #Emphysema, POA  #Tobacco use, POA  Previous PPD smoker from age 46 and reduced over last few years to 1 cigarette daily.   - nicotine patch 7 mg   - fluticasone-salmeterol inhaler 1 puff BID, albuterol PRN  - nicotine cessation      #Anxiety, chronic, POA   #Major depression, chronic, POA  #Fibromyalgia, chronic, POA  #Insomnia, chronic, POA   States has increased stress from daughter. Denies SI/HI.   - continue home seroquel100 mg BID   - continue home duloxetine 60 mg daily   - melatonin 5mg  at bedtime prn insomnia             INPATIENT CHECKLIST:  Diet regular  DVT ppx: enoxaparin  Bowel cares: miralax, senna  Lines: Peripheral IV 22 G Anterior;Left Forearm (1)  Tubes/Drains: none  Activity: out of bed TID with assist, inspiratory spirometer 10 times per hours while awake, and as tolerated  De-escalation (e.g. end of life issues, treatments no longer needed): None    I have seen and examined the patient and agree with the RD assessment detailed below:     Patient meets criteria for:      (current weight 47.4 kg (104 lb 8 oz),  ;  ,  ).   See RD notes for additional details.      Advanced Care Planning:  Full Code, Primary Emergency Contact: [name],[name]    DISPOSITION:  Observation, Non-Monitored. Expected post-hospitalization disposition will be to home.        Annye English, MD  [facility] Hospitalist [facility]  09/25/2023 7:22 AM    Voice Recognition Disclaimer - Due to possible errors in transcription, there may be errors in documentation: Due to voice recognition software, sound alike and misspelled words may be contained in the documentation. Portions of this chart may have been created with Judie Petit- [employer] Direct Voice Recognition Software. Occasional wrong-word or sound-alike substitutions may have occurred due to the inherent limitations of voice recognition software. Please read the chart carefully and recognize, using context, where these substitutions may have occurred.    51 minutes were spent personally by me today on this encounter which include today's pre-visit review of the chart, obtaining appropriate history, performing an evaluation, documentation and discussion of management with details supported within the note for today's visit. The time documented was exclusive of any time spent on the separately billed procedure.

## 2023-09-25 NOTE — Consults
NUTRITION ASSESSMENT (Adult)    Admit Date:      Date of Birth: 08-13-1966 Gender: female MRN: [id number]     Date of Assessment: 09/25/23   Status: Consult and Assessment   Indication: Malnutrition   Subjective: Visited patient in the ED. She reports having a poor appetite and intake prior to admission. She has a longstanding hx of poor intake which she states has not improved. She will eat small portioned meals at home.She reports both diarrhea and constipation at home. She reports taking pancreatic enzymes at home. No use of dietary supplements at home. Her weight has remained relatively stable but below her previous UBW.    Problems: Active Problems:    * No active hospital problems. *     MD H&P 09/24/23: Skylen Danielsen is a 57 y.o. female hx of intestinal stoma prolapse, duodenal ucler, peptic ulcer disease, hypotension, colitis, superior mesenteric artery stenosis, depression, emphysema, gastritis, GERD, pancreatitis s/p lap cholecystectomy/ extensive adhesiolysis (03/24/22), and s/p stent placement (04/29/2023), presenting with acute worsening of chronic abdominal pain.      States pain feels like the ''same old pain'', has had it for the last few days. Had associated nausea/vomiting as well, but currently not having any nausea. At home, she took oxycodone q4h around the clock and experienced bloating. Unknown last BM.  She also expresses that she lives with her daughter and her mom, and of late has had increasing stress from daughter. No chest pain, no SOB.      Denies fever/chills, dysuria, diarrhea. Denies recent travel, sick contacts, or URI symptoms.     Past Medical History:   Diagnosis Date    Anxiety     Depression     Emphysema, unspecified (HCC/RAF)     Emphysema, unspecified (HCC/RAF)     Fibromyalgia     Gastritis     GERD (gastroesophageal reflux disease)     History of blood transfusion     Intractable nausea and vomiting 01/23/2020    Pancreatitis     Past Surgical History:   Procedure Laterality Date ABDOMINAL SURGERY      COLON SURGERY             Data   Intake/Outputs: I/O last 2 completed shifts:  In: 1220 [P.O.:120; IV Piggyback:1100]  Out: -     Pertinent Medications: Scheduled Meds:   acetaminophen  1,000 mg Intravenous Q8H    aspirin  81 mg Oral Daily    atorvastatin  40 mg Oral QHS    cholestyramine  4 g Oral TID    clopidogrel  75 mg Oral Daily    DULoxetine  60 mg Oral Daily    fluticasone-salmeterol  1 puff Inhalation BID    lidocaine  1 patch Transdermal Q24H    nicotine patch  7 mg Transdermal Daily    non formulary  500 mg Oral BID    pancrelipase (Lip-Prot-Amyl)  36,000 units of lipase Oral TID w/meals    pantoprazole  40 mg Oral BID    polyethylene glycol  17 g Oral Daily    pregabalin  25 mg Oral BID    QUEtiapine  100 mg Oral BID    senna  1 tablet Oral Daily    cholecalciferol  50 mcg Oral Daily     Continuous Infusions:   sodium chloride Stopped (09/24/23 1310)     PRN Meds:albuterol, analgesic balm, bisacodyl, calcium carbonate, hyoscyamine, magnesium hydroxide, melatonin oral/enteral/sublingual, ondansetron injection/IVPB **OR** ondansetron, oxyCODONE **OR** oxyCODONE **OR**  oxyCODONE, simethicone    FDI Target Drugs: No      Pertinent Labs:   Recent Labs     09/25/23  0629 09/24/23  0736   NA 137 138   K 4.2 4.2   BUN 13 15   CREAT 0.88 0.84   GLUCOSE 84 90   CALCIUM 8.8 9.3   ALBUMIN  --  4.3   WBC 7.45 7.00   HGB 9.5* 10.6*   HCT 31.2* 34.4*   AST  --  18   ALT  --  11   ALKPHOS  --  96   LIPASE  --  23   AMYLASE  --  121       Trended Labs     05/12/23  0658 01/28/23  0646   HGBA1C 5.4  --    TRIGLY  --  93      Trended Labs     08/28/23  0906 08/28/23  0702 08/28/23  0700 08/11/23  0700 08/11/23  0659 04/24/23  1555 04/24/23  0302 04/24/23  0302 04/24/23  0301   VITD25OH  --   --  16*  --   --   --   --   --  17*   VITAMINC  --   --  64  --   --   --   --   --   --    VITAMINB1  --   --  154  --   --   --   --   --  117   VITAMINB12  --  >4,000*  --  268  --   --    < > 271  -- MEMALSER  --   --   --   --  0.15  --   --   --   --    FOLATE  --   --  13.1 17.5  --   --   --   --  21.8   FOLATERBC 482  --   --   --   --   --   --   --   --    FE  --   --   --  35*  --   --   --  42  --    TIBC  --   --   --  356  --   --   --  381  --    FEBINDSAT  --   --   --  10  --   --   --  11  --    FERRITIN  --   --   --  15  --   --   --  27  --    CUSER  --   --   --  64.6*  --   --   --   --   --    ZINCSER  --   --   --   --   --  94.0  --   --   --     < > = values in this interval not displayed.       Accu-Chek: No results found in last 72 hours    Respiratory Status / O2 Device: None (Room air)    Pressure Injury: None    Additional Data: Peripheral IV 22 G Anterior;Left Forearm (1)       Edema  Flowsheet Row Most Recent Value   Generalized  Sacral     RUE     LUE     RLE     LLE        Diet Info   Allergies:   Hydrocodone, Ibuprofen, and Morphine  Cultural/Ethnic/Religious/Other Food Preferences:  None     Nutrition prior to admit:  Regular food, small meals  Current diet order:     Diets/Supplements/Feeds   Diet    Diet regular     Start Date/Time: 09/24/23 1200      Number of Occurrences: Until Specified     PO % consumed: 0 to 25%  Enteral Nutrition: None  Parenteral Nutrition: None  Other Caloric Sources: None     Anthropometrics:   Height: 162.6 cm (5' 4.02'')  Admit Weight: 49 kg (108 lb 0.4 oz) (09/24/23 0650)  Current Weight: 47.4 kg (104 lb 8 oz)  BMI: 17.93  IBW: 54 kg/120 lbs  IBW %: 87  Adj BW (if applicable): 53 kg  UBW:    UBW %:         Weight History  Wt Readings from Last 15 Encounters:   09/24/23 47.4 kg (104 lb 8 oz)   09/10/23 48.7 kg (107 lb 5.8 oz)   08/26/23 48.5 kg (106 lb 14.8 oz)   08/10/23 47.9 kg (105 lb 9.6 oz)   05/14/23 47.2 kg (104 lb)   04/11/23 50.9 kg (112 lb 3.4 oz)   03/29/23 50.7 kg (111 lb 12.8 oz)   01/28/23 45.4 kg (100 lb)   11/24/22 49.2 kg (108 lb 7.5 oz)   07/23/22 52.2 kg (115 lb)   07/01/22 56.5 kg (124 lb 9 oz)   05/18/22 57 kg (125 lb 10.6 oz)   05/07/22 54.4 kg (120 lb 0.3 oz)   04/21/22 59.6 kg (131 lb 6.3 oz)   03/27/22 64.4 kg (141 lb 15.6 oz)      Estimated Nutrition Needs   Based on current weight: 47.4 kg   Estimated Calorie Needs: 1659 - 1896 kcal/day (35 - 40 kcal/kg)  Estimated Protein Needs: 71.1 - 94.8 g protein/day (1.5 - 2 g protein/kg)     Diet Education   To be provided prior to discharge (as needed)          Malnutrition Assessment using AND/ASPEN Consensus Criteria    Malnutrition in the Context of: Mild-moderate inflammation/chronic disease   Energy Intake: <75% of estimated energy requirement for > or = 1 month; Supportive data: 5 small portioned meals per day   Weight Loss: Does not meet criterion    Nutrition-Focused Physical Exam (NFPE): 09/25/2023  Subcutaneous Fat Loss: Moderate  Areas examined/observed: Orbital Upper, Arm  Muscle Loss: Moderate  Areas examined/observed: Temple, Clavicle, Deltoid, Interosseous, Anterior Thigh       Patient meets criteria for: Severe protein calorie malnutrition     Based on intake, physical findings and RD clinical judgement     Nutrition Assessment   Anthropometrics: Body mass index is 17.94 kg/m?Marland Kitchen Based on current weight of 47.4 kg/104 lb. Patient has remained relatively weight stable over the past year. However, this weight is far below her previous UBW of ~145-150 lbs.     Nutrition: Patient is on a regular diet at this time. She has been having poor intake during this admission. Poor intake is also reported at home, with pt stating she eats 5 small meals per day. Patient informed RD that her portioned meals are about the size of a breakfast [place] patty. She denies taking any  dietary or oral supplements at home but reports adherence to pancreatic enzymes.     Tolerance/GI:  . Patient is on bowel regimen per team. Low appetite, abdominal pain, nausea, vomiting, constipation, and occasional diarrhea reported.     Skin: intact    Labs: Reviewed; WNL.     Micronutrients: Zinc WNL on 04/2023, Copper low on 07/2023; Low vitamin D on 08/2023,  Vitamin B12 elevated on 08/2023;   Vitamin B1 Wnl on 08/2023; Vitamin C WNL on 09/24  Pt on cholecalciferol      Recommendations / Care Plan   1. Continue current ordered diet: Regular  2.  Boost VHC three times daily (vanilla or strawberry flavor)  3. Rec'd additional vitamin and mineral supplementation   100 mg thiamine once daily    Multivitamin with minerals tab once daily    50 mg cholecalciferol once daily   4. Rec'd rechecking copper levels given hx of deficiency    If low, consider 2 mg copper once daily for 5 days  4. Consider providing nutrition support (enterally) given patient's longstanding hx of poor nutritional intake   5. PERT and bowel regimen per treatment   6. Monitor PO intake and weight trends       Author: Jeni Salles, RD pager [phone]   09/25/2023 11:52 AM       [employer] Attending  RD to monitor and follow-up per nutrition protocol

## 2023-09-26 LAB — Basic Metabolic Panel
CHLORIDE: 105 mmol/L (ref 96–106)
CREATININE: 0.82 mg/dL (ref 0.60–1.30)
POTASSIUM: 3.8 mmol/L (ref 3.6–5.3)

## 2023-09-26 LAB — CBC: HEMOGLOBIN: 9.7 g/dL — ABNORMAL LOW (ref 11.6–15.2)

## 2023-09-26 MED ORDER — ASPIRIN 81 MG PO TBEC
81 mg | ORAL_TABLET | Freq: Every day | ORAL | 0 refills | Status: AC
Start: 2023-09-26 — End: ?

## 2023-09-26 MED ORDER — OXYCODONE HCL 15 MG PO TABS
15 mg | ORAL_TABLET | Freq: Four times a day (QID) | ORAL | 0 refills | Status: AC | PRN
Start: 2023-09-26 — End: ?

## 2023-09-26 MED ORDER — POLYETHYLENE GLYCOL 3350 17 G PO PACK
17 g | PACK | Freq: Every day | ORAL | 0 refills | 28.00000 days | Status: AC
Start: 2023-09-26 — End: ?

## 2023-09-26 MED ADMIN — CHOLESTYRAMINE 4 G PO PACK: 4 g | ORAL | @ 20:00:00 | Stop: 2023-09-27

## 2023-09-26 MED ADMIN — ACETAMINOPHEN 10 MG/ML IV SOLN: 1000 mg | INTRAVENOUS | @ 02:00:00 | Stop: 2023-09-26 | NDC 00264410090

## 2023-09-26 MED ADMIN — FLUTICASONE-SALMETEROL 250-50 MCG/ACT IN AEPB: 1 | RESPIRATORY_TRACT | @ 16:00:00 | Stop: 2023-09-27 | NDC 00054032756

## 2023-09-26 MED ADMIN — SODIUM CHLORIDE 0.9% IV SOLN (100 ML): 5 mL/h | INTRAVENOUS | @ 02:00:00 | Stop: 2023-09-27 | NDC 00338004938

## 2023-09-26 MED ADMIN — CHOLESTYRAMINE 4 G PO PACK: 4 g | ORAL | @ 02:00:00 | Stop: 2023-09-27 | NDC 42806026698

## 2023-09-26 MED ADMIN — ENOXAPARIN SODIUM 40 MG/0.4ML IJ SOSY: 40 mg | SUBCUTANEOUS | @ 15:00:00 | Stop: 2023-09-27

## 2023-09-26 MED ADMIN — PANCRELIPASE (LIP-PROT-AMYL) 36000-114000 UNITS PO CPEP: 36000 [IU] | ORAL | @ 15:00:00 | Stop: 2023-09-27 | NDC 00032301613

## 2023-09-26 MED ADMIN — NICOTINE 7 MG/24HR TD PT24: 7 mg | TRANSDERMAL | @ 20:00:00 | Stop: 2023-09-27

## 2023-09-26 MED ADMIN — FLUTICASONE-SALMETEROL 250-50 MCG/ACT IN AEPB: 1 | RESPIRATORY_TRACT | @ 08:00:00 | Stop: 2023-09-27

## 2023-09-26 MED ADMIN — QUETIAPINE FUMARATE 100 MG PO TABS: 100 mg | ORAL | @ 04:00:00 | Stop: 2023-09-27 | NDC 60687034911

## 2023-09-26 MED ADMIN — TRAZODONE HCL 25 MG PO TABS: 50 mg | ORAL | @ 04:00:00 | Stop: 2023-09-26

## 2023-09-26 MED ADMIN — PANCRELIPASE (LIP-PROT-AMYL) 36000-114000 UNITS PO CPEP: 36000 [IU] | ORAL | @ 20:00:00 | Stop: 2023-09-27

## 2023-09-26 MED ADMIN — ACETAMINOPHEN 10 MG/ML IV SOLN: 1000 mg | INTRAVENOUS | @ 13:00:00 | Stop: 2023-09-26 | NDC 00264410090

## 2023-09-26 MED ADMIN — RANOLAZINE ER 500 MG PO TB12: 500 mg | ORAL | @ 04:00:00 | Stop: 2023-09-27

## 2023-09-26 MED ADMIN — CLOPIDOGREL BISULFATE 75 MG PO TABS: 75 mg | ORAL | @ 15:00:00 | Stop: 2023-09-27

## 2023-09-26 MED ADMIN — THERA M PLUS PO TABS: 1 | ORAL | @ 15:00:00 | Stop: 2023-09-27 | NDC 77333086125

## 2023-09-26 MED ADMIN — ATORVASTATIN CALCIUM 20 MG PO TABS: 40 mg | ORAL | @ 04:00:00 | Stop: 2023-09-27 | NDC 00904629161

## 2023-09-26 MED ADMIN — PANTOPRAZOLE SODIUM 40 MG PO TBEC: 40 mg | ORAL | @ 04:00:00 | Stop: 2023-09-27 | NDC 60687073609

## 2023-09-26 MED ADMIN — RANOLAZINE ER 500 MG PO TB12: 500 mg | ORAL | @ 15:00:00 | Stop: 2023-09-27 | NDC 60687054911

## 2023-09-26 MED ADMIN — QUETIAPINE FUMARATE 100 MG PO TABS: 100 mg | ORAL | @ 15:00:00 | Stop: 2023-09-27 | NDC 60687034911

## 2023-09-26 MED ADMIN — PREGABALIN 25 MG PO CAPS: 25 mg | ORAL | @ 04:00:00 | Stop: 2023-09-27 | NDC 60687047311

## 2023-09-26 MED ADMIN — PREGABALIN 25 MG PO CAPS: 25 mg | ORAL | @ 15:00:00 | Stop: 2023-09-27 | NDC 60687047311

## 2023-09-26 MED ADMIN — SENNOSIDES 8.6 MG PO TABS: 1 | ORAL | @ 15:00:00 | Stop: 2023-09-27 | NDC 00904725261

## 2023-09-26 MED ADMIN — CHOLECALCIFEROL 25 MCG (1000 UT) PO TABS: 50 ug | ORAL | @ 15:00:00 | Stop: 2023-09-27

## 2023-09-26 MED ADMIN — POLYETHYLENE GLYCOL 3350 17 G PO PACK: 17 g | ORAL | @ 15:00:00 | Stop: 2023-09-27

## 2023-09-26 MED ADMIN — PANCRELIPASE (LIP-PROT-AMYL) 36000-114000 UNITS PO CPEP: 36000 [IU] | ORAL | Stop: 2023-10-24 | NDC 00032301613

## 2023-09-26 MED ADMIN — THIAMINE HCL 100 MG PO TABS (MULTI GPI): 100 mg | ORAL | @ 15:00:00 | Stop: 2023-09-27 | NDC 77333093425

## 2023-09-26 MED ADMIN — OXYCODONE HCL 5 MG PO TABS: 10 mg | ORAL | @ 18:00:00 | Stop: 2023-09-26 | NDC 68084035411

## 2023-09-26 MED ADMIN — OXYCODONE HCL 5 MG PO TABS: 15 mg | ORAL | @ 13:00:00 | Stop: 2023-09-27 | NDC 68084035411

## 2023-09-26 MED ADMIN — POLYETHYLENE GLYCOL 3350 17 G PO PACK: 17 g | ORAL | @ 04:00:00 | Stop: 2023-09-26

## 2023-09-26 MED ADMIN — ASPIRIN 81 MG PO TBEC: 81 mg | ORAL | @ 15:00:00 | Stop: 2023-09-27

## 2023-09-26 MED ADMIN — OXYCODONE HCL 5 MG PO TABS: 15 mg | ORAL | Stop: 2023-09-27 | NDC 68084035411

## 2023-09-26 MED ADMIN — DULOXETINE HCL 30 MG PO CPEP: 60 mg | ORAL | @ 15:00:00 | Stop: 2023-09-27 | NDC 60687073411

## 2023-09-26 MED ADMIN — PANTOPRAZOLE SODIUM 40 MG PO TBEC: 40 mg | ORAL | @ 15:00:00 | Stop: 2023-09-27 | NDC 60687073609

## 2023-09-26 MED ADMIN — LIDOCAINE 5 % EX PTCH: 1 | TRANSDERMAL | @ 20:00:00 | Stop: 2023-09-27

## 2023-09-26 MED ADMIN — CHOLESTYRAMINE 4 G PO PACK: 4 g | ORAL | @ 13:00:00 | Stop: 2023-09-27

## 2023-09-26 NOTE — Progress Notes
[facility] CASE MANAGER DISCHARGE PLANNING PROGRESS NOTE  Department of Care Coordination  Covering "Sunday 09/26/2023       Admit Date:[id number]  Anticipated Date of Discharge: 09/27/2023    Following MD:Chan, Sarah M., MD      Today's short update     10" /05/2023 0642 AM - Abdominal pain. Post acute plan of care: home if tolerating diet and having bowel movement.    Disposition     Home  No needs identified  [address]   [place] [address]  Family/Support System in agreement with the current discharge plan: Yes, in agreement and participating    Freedom of Choice     Treatment Preferences: Addressed  Discharge Goals: Addressed  Freedom of Choice: Educated and Provided                      Multidisciplinary Team Member Plan of Care   Interdisciplinary rounds were conducted with the multidisciplinary team including the clinical social worker and nurse case manager. The patient's plan of care and discharge plan were discussed and formulated based on the patient's specific needs.        Yates Weisgerber Daisylyn Garcia-Duff Pozzi, RN, MSN-L, MHA GR, RN, PHN, IQCI, HIT, CCM  [facility] Senior Clinical Case Production designer, theatre/television/film, Department of Care Coordination and Clinical Social Work     Geriatrics Unit: [phone]                       Fax: [phone]                                        Pager: [phone]

## 2023-09-26 NOTE — Consults
FINAL DISCHARGE MULTIDISCIPLINARY NOTE  Department of Care Coordination      Admit UEAV:[id number]  Anticipated Date of Discharge: 09/26/2023    Following BJ:YNWG, Brand Males., MD    Home Address:[address]  [place] [address]      DISCHARGE INFORMATION:     Discharge Address: [address]   [place] [address]    Individual(s) notified of discharge plan:  Contact Name: [name] Relationship: Daughter   Contact Number(s): [phone]              Support Systems: Family       Medicare Important Message Provided: Not Applicable (Medi-cal Assigned)  Medicare Outpatient Observation Notice (MOON) Provided: Not Applicable (Medi-cal Assigned)      Final Discharge Needs: No Needs        Is this patient eligible for ONclick?: No        Freedom of Choice   Freedom of Choice/Patient Choice  Treatment Preferences: Addressed  Discharge Goals: Addressed  Freedom of Choice: Educated and Provided      SDOH   Social Drivers of Health (SDOH)  Within the past 12 months, has a lack of transportation kept you from medical appointments, meetings, work, or from getting things needed for daily living? : No  How hard is it for you to pay for the very basics like food, housing, medical care, and heating?: Not very hard  How hard is it for you to pay for prescriptions or medical bills?: Not very hard  In the past 12 months has the electric, gas, oil, or water company threatened to shut off services in your home?: No  In the last 12 months, was there a time when you were not able to pay the mortgage or rent on time?: No  In the last 12 months, how many places have you lived?: 1  In the last 12 months, was there a time when you did not have a steady place to sleep or slept in a shelter (including now)?: No      Lexy Meininger Daisylyn Garcia-Mariadel Mruk, RN, MSN-L, MHA GR, RN, PHN, IQCI, HIT, CCM  [facility] Senior Clinical Case Manager, Department of Care Coordination and Clinical Social Work     Geriatrics Unit: [phone] Fax: [phone]                                        Pager: [phone]

## 2023-09-26 NOTE — Nursing Note
1917 pm   Endorsed to next shift RN Gines to continue plan of care, stable at this time.

## 2023-09-26 NOTE — Other
Patient's Clinical Goal:   Clinical Goal(s) for the Shift: VSS, safety, rest, pain management  Identify possible barriers to advancing the care plan:   Stability of the patient: Moderately Unstable - medium risk of patient condition declining or worsening    Progression of Patient's Clinical Goal: no distress noted, alert x 4, stable in ambulation, oxycodone PO & tylenol IV given for abdominal pain, no SOB noted, RA, non tele, able to sleep within the shift, needs attended, has 1 BM. Blood pressure 116/77, pulse 72, temperature 36.7 ?C (98 ?F), temperature source Oral, resp. rate 18, height 1.676 m (5' 6''), weight 50.3 kg (111 lb), SpO2 93%.   GERIATRICS END OF SHIFT - DISCHARGE MARKERS of Instability Checklist  Nursing assessment:     Indicator   Cutoff Check if indicator needs to be addressed (meets cutoff)   Situation/Action/Comment   O2 requirement New since admission []     PO intake Less than 50% []     Urination None in past 8 hours/last shift []     Foley New since admission []     Pain Present  [x]     Bowel movements  <1 in 2 days or >3 in 24 hours []  09/25/23   Delirium Unable to follow simple commands or participate with PT/OT []     Mobility Unable to stand and/or walk to bathroom []  Stable in ambulation       BOOST / SAFE TRANSITION - DISCHARGE INDICATORS    Indicator Check if indicator has red ''P''    Situation/Action/Comment     Problems with Medications  [x]     Psychological  []     Principal Diagnosis  []     Physical Limitations  []     Poor Health Literacy  []     Patient Support  []     Prior Hospitalizations  [x]     Palliative Care  []       DELIRIUM PREVENTION  Protocols Strategies Check if implemented Comments   Risk factors >3 present- High risk []     Purposeful orientation Reorient, purposeful orienting conversation  Familiar objects in room []   []     Therapeutic activities Volunteer visit  Cognitive stimulation activities: games, reading, music []   []     Vision & hearing Assistance with: Leisure centre manager  Assistance with: hearing aids/hearing amplifier []   []     Feeding & hydration Assistance with feeding  Assistance with dentures []   []     Sleep hygiene Shades/blinds/lights on during day, limit naps during day  Quiet environment, consolidate care []     Mobilization BMAT 3-4: Ambulate TID  BMAT 2: OOB daily to chair for meals <= 2 hours, each time  BMAT 1: OOB to cardiac chair daily for meals <= 2 hours, each time []   []   []     Pain Non-narcotics ATC  Non-pharmacological: oil/aromatherapy, massage, music []   []     Maintain safety Fall precautions, volunteer visit, family at bedside, tele sitter, constant observer []     Manage agitation Redirect with calm, gentle voice and avoid confrontation  Avoid restraints and use alternative to restraints  Doll, music or animal therapy, as appropriate  Volunteer for companionship if safe and appropriate []   []   []   []       DELIRIUM: CAM (+)  Protocols Strategies Check if implemented Comments   New-onset MD contacted  Delirium order-set initiated  Bladder scan to R/O retention  Assess stool impaction  Medication reviewed with pharmacist []   []   []   []   []  MD Name:  Existing Manage and prevent further delirium []

## 2023-09-26 NOTE — Consults
CASE MANAGER ASSESSMENT      Admit ZOXW:[id number]    Date of Initial CM Assessment: 09/26/2023    Problems: Active Problems:    * No active hospital problems. *       Past Medical History:   Diagnosis Date    Anxiety     Depression     Emphysema, unspecified (HCC/RAF)     Emphysema, unspecified (HCC/RAF)     Fibromyalgia     Gastritis     GERD (gastroesophageal reflux disease)     History of blood transfusion     Intractable nausea and vomiting 01/23/2020    Pancreatitis     Past Surgical History:   Procedure Laterality Date    ABDOMINAL SURGERY      COLON SURGERY            Primary Care Physician:Butler, Josem Kaufmann, MD  Phone:[phone]    LANGUAGE ASSISTANCE:   Language Assistance  Language Resource Used?: Patient declined    NEEDS ASSESSMENT:     Level of Function Prior to Admit: Minimal Assist    Primary Living Situation: Lives w/Family         Pre-admission Living Situation: Home/Apartment                  Primary Support Systems: Family members    Contact Name: [name] Phone Number: [phone]   Does the patient have a Family/Support System member participating in Discharge Planning?: Yes    DPOA?: Yes DPOA Type: Medical     Bathroom on Main Floor: Yes          Prior Treatments / Services: None       Who is your PCP?: Dr Charm Barges    Do you have your Primary Care Doctor's office number?: Yes    How often do you visit your doctor?: Quarterly (as needed)    Do you need information/education regarding your medical condition?: No               Were you hospitalized in the last 30 days?: Yes        READMIT ASSESSMENT: (IF APPLICABLE)     Is this a planned readmission?: No         Interview is with: Patient & Family/Caregiver      In your opinion, what brought you back to the hospital?: Change in medical condition   Did you receive your DC instructions from your previous DC?: Yes   Was there anything missing from or unclear with your discharge instructions?: No   Did you speak with your Doctor before coming to the hospital?: No   Are you able to take ALL your medications the way they have been prescribed?: Yes   Do you feel isolated/lonely?: No       FOLLOW UP APPT QUESTIONS: (IF APPLICABLE)     Was the follow up appt made before discharge?: Yes    When was the follow-up appointment scheduled post discharge?: 4-7 days    Were you able to attend your follow-up appointment?: Yes      RISK STRATIFICATION: (IF APPLICABLE)     > 2 admissions within the last 12 months?: Yes    Multiple co-morbidities?: Yes        DISCHARGE ASSESSMENT:     Projected Date of Discharge: 09/26/2023    Anticipated Complex D/C?: No    Projected Discharge to: Home    Discharge Address: [address]   [place] [address]  Projected Discharge Needs: None    Freedom of Choice: Educated and Provided                        Support Identified at Discharge: Child  Name of Discharge Support Person: [name] Phone Number: [phone]     Who is available to transport you upon discharge?: Family Transportation, Need help Notify Child psychotherapist of Patient's Transportation Options?: Yes       SDOH     Within the past 12 months, has a lack of transportation kept you from medical appointments, meetings, work, or from getting things needed for daily living? : No  How hard is it for you to pay for the very basics like food, housing, medical care, and heating?: Not very hard  How hard is it for you to pay for prescriptions or medical bills?: Not very hard  In the past 12 months has the electric, gas, oil, or water company threatened to shut off services in your home?: No  In the last 12 months, was there a time when you were not able to pay the mortgage or rent on time?: No  In the last 12 months, how many places have you lived?: 1  In the last 12 months, was there a time when you did not have a steady place to sleep or slept in a shelter (including now)?: No          Mirissa Lopresti Daisylyn Garcia-Kris No, RN, MSN-L, MHA GR, RN, PHN, IQCI, HIT, CCM  Senior Clinical Case Manager - Geriatric Medicine, Department of Care Coordination and Clinical Social Work     Geriatrics Unit: [phone]                       Fax: [phone]                                            Pager: [phone]

## 2023-09-26 NOTE — Discharge Summary
Discharge Summary   Name: Donna Gross MRN: [id number] DOB: 1966-05-22   Admit Date: 09/24/2023 D/C Date: 09/26/2023    LOS:   2 days    Admit Attending: Drue Flirt. Shawnie Dapper, MD Discharge Attending: Garnet Sierras., MD    PCP: Kelle Darting, MD Discharge Provider: Garnet Sierras, MD     Inpatient Care Team/ Consults:    None Outpatient Care Team  No care team member to display     Admission Diagnosis:  (reason for admission)  Acute on chronic abdominal pain  Discharge Diagnosis:  (conditions treated during hospitalization)  Acute on chronic abdominal pain, POA  SMA stenosis, chronic, POA  Celiac stenosis, POA  CT evidence of CAD, POA  s/p angio and stenting of SMA of 04/29/23  Multiple abdominal surgeries c/b adhesions, chronic, POA  GERD, chronic, POA  Constipation, chronic, intermittent, POA  Nausea/vomiting, chronic, POA  Hx of pancreatic insufficiency, chronic, POA  Chronic 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 Irena Cords H&P  ''CC: Abdominal Pain (Abdominal pain, nausea, vomiting, and diarrhea for several days.)     HPI   Donna Gross is a 57 y.o. female hx of intestinal stoma prolapse, duodenal ucler, peptic ulcer disease, hypotension, colitis, superior mesenteric artery stenosis, depression, emphysema, gastritis, GERD, pancreatitis s/p lap cholecystectomy/ extensive adhesiolysis (03/24/22), and s/p stent placement (04/29/2023), presenting with acute worsening of chronic abdominal pain.      States pain feels like the ''same old pain'', has had it for the last few days. Had associated nausea/vomiting as well, but currently not having any nausea. At home, she took oxycodone q4h around the clock and experienced bloating. Unknown last BM.  She also expresses that she lives with her daughter and her mom, and of late has had increasing stress from daughter. No chest pain, no SOB.      Denies fever/chills, dysuria, diarrhea. Denies recent travel, sick contacts, or URI symptoms.      ER course:   Afebrile, VSS. CT abdomen with contrast shows severe atherosclerotic calcifications and moderate stool throughout the colon; no e/o bowel wall thickening or vascular occlusion to suggest acute ischemia.   Received the following medications:   Benadryl 50mg  IV x1  Famotidine 20mg  IV x1  Dilaudid 1mg  IV x1  Zofran 4mg  x 1  NS bolus x 1L ''  Hospital Course:    Work up for acute on chronic abdominal pain was unrevealing for acute pathology, as CT abdomen and did not show bowel wall thickening, vascular occlusion to suggest acute ischemia, slightly atrophic pancreas with ducts within normal limits. Noted moderate stool burden. No mention of SBO. Lipase normal making pancreatitis unlikely.  Lactate was normal.  Leading differential was multifactorial in the setting opioid dependence, constipation, chronic pancreatic insufficiency and fibromyalgia.  Patient was started on multimodal pain medications including IV Tylenol 1 g q.8 hours, resumed home duloxetine, pregabalin, hyoscyamine q.4 PRN, offered lidocaine patches and analgesic balms, ranolazine for which patient declined. Patient additionally offered other nonopiate pain medication such as simethicone for patient's reported bloating however patient declined this medication. She remained on aspirin and Plavix without overt signs bleeding. She has had extensive workup of this chronic abdominal pain but  has not yet followed up with GI in the outpatient setting for colonoscopy.  Of note, patient expressed that she comes to this hospital for IV Dilaudid as she was given it before. She is well-informed about different opiates and their side effects including constipation. There is a concern for possible opiate seeking behavior.     Additionally, as documented in her prior discharge summary on 08/29/2023 she received IV Dilaudid for acute on chronic pain and developed hypercarbic respiratory failure briefly requiring Bipap and therefore recommend extremely judicious use of IV opiates.     On discharge patient was informed that a referral has been placed for GI for colonoscopy. Patient was tolerating diet, BMP stable and having regular bowel movements.    Outpatient Provider To-Do List  PCP - consider referral to outpatient chronic pain and ensure follow up with GI for colonoscopy (recommended on prior admission), follow up copper level        LACE+ Score: 53 (09/26/23 1327) Moderate Risk of Readmission  Better Outcomes by Optimizing Safe Transitions        This patient meets criteria for the following BOOST risk conditions which could lead to readmission. Please consider further evaluation or action on the potential barriers below:    Problems with Medications:      16 scheduled medications active at discharge  High-risk active medications: aspirin 81 mg EC tablet [[id number]], clopidogrel 75 mg tablet [[id number]], pregabalin 25 mg capsule [[id number]]      Patient Support:     Home Health Ordered: No.          Final Discharge CM Note Written: 09/26/2023  Contact Name: [name] Daughter [phone]  Support Systems: Family              Pending Labs   The following tests have been collected, but not yet resulted at the time of discharge.    Unresulted Labs (From admission, onward)       Start     Ordered    09/25/23 1330  Copper, Plasma  [[id number]]  Once,   Routine        References:    [facility] Test Directory Information    09/25/23 1326                  The following labs have a preliminary result at the time of discharge.  Please check back if the final results have changed.  Preliminary Resulted Labs (From admission, onward)      None          Discharge Medication List  Coumadin and Opiate Risk Assessment   This patient does not have coumadin on their discharge med list  Controlled meds: Controlled Medications       Opioid Agonists Disp Start End     oxyCODONE 15 mg tablet 20 tablet 09/26/2023 10/01/2023    Sig - Route: Take 1 tablet (15 mg total) by mouth every six (6) hours as needed for Severe Pain (Pain Scale 7-10). Max Daily Amount: 60 mg - Oral    Earliest Fill Date: 09/26/2023           Discharge Medication List as of 09/26/2023 10:41 AM        START taking these medications    Details   aspirin 81 mg EC tablet Take 1 tablet (81 mg total) by mouth daily., Starting Mon 09/27/2023, Until Wed 10/27/2023, Normal      oxyCODONE 15 mg tablet Take 1 tablet (15 mg total) by mouth  every six (6) hours as needed for Severe Pain (Pain Scale 7-10). Max Daily Amount: 60 mg, Starting Sun 09/26/2023, Until Fri 10/01/2023 at 2359, Normal      polyethylene glycol powder packet Take 1 packet (17 g total) by mouth daily., Starting Mon 09/27/2023, Until Wed 10/27/2023, Normal           CONTINUE these medications which have NOT CHANGED    Details   acetaminophen 500 mg tablet Take 2 tablets (1,000 mg total) by mouth every six (6) hours as needed for Pain., Starting Wed 08/11/2023, Normal      albuterol 90 mcg/act inhaler Inhale 2 puffs every six (6) hours as needed for Wheezing or Shortness of Breath., Historical Med      atorvastatin 40 mg tablet Take 1 tablet (40 mg total) by mouth at bedtime., Starting Wed 08/11/2023, Normal      B Complex Vitamins (VITAMIN B COMPLEX) capsule Take 1 capsule by mouth daily., Historical Med      Cholecalciferol (VITAMIN D3) 50 mcg (2000 units) TABS Take 1 tablet (50 mcg total) by mouth daily., Historical Med      cholestyramine 4 g/dose powder Take 1 scoop (4 grams total) by mouth three (3) times daily with meals., Starting Wed 08/11/2023, Normal      DULoxetine 60 mg DR capsule Take 1 capsule (60 mg total) by mouth daily., Starting Wed 08/11/2023, Normal      fluticasone-salmeterol 250-50 mcg/act diskus inhaler Inhale 1 puff two (2) times daily., Historical Med      hyoscyamine 0.125 mg tablet Take 1 tablet (125 mcg total) by mouth every four (4) hours as needed for Cramping., Starting Wed 08/11/2023, Normal      ipratropium-albuterol 20-100 mcg/act inhaler Inhale 2 puffs four (4) times daily as needed for Wheezing or Shortness of Breath., Historical Med      lidocaine 5% patch Place 3 patches onto the skin daily 12 hours on 12 hours off., Starting Wed 08/11/2023, Normal      loperamide 2 mg capsule Take 1 capsule (2 mg total) by mouth four (4) times daily as needed for Diarrhea., Starting Wed 08/11/2023, Normal      naloxone 4 mg/0.1 mL nasal spray Call 911. Administer a single spray intranasally into one nostril for opioid overdose. May repeat in 3 minutes if patient is not breathing.., Normal      pancrelipase, Lip-Prot-Amyl, (CREON) 36000 units DR capsule Take 1 capsule (36,000 units of lipase total) by mouth three (3) times daily with meals., Starting Wed 08/11/2023, Normal      pantoprazole 40 mg DR tablet Take 1 tablet (40 mg total) by mouth daily., Starting Wed 08/11/2023, Normal      pregabalin 25 mg capsule Take 1 capsule (25 mg total) by mouth two (2) times daily. Max Daily Amount: 50 mg, Starting Wed 08/11/2023, Normal      QUEtiapine 100 mg tablet Take 1 tablet (100 mg total) by mouth two (2) times daily., Starting Wed 08/11/2023, Normal      ranolazine 500 mg 12 hr tablet Take 1 tablet (500 mg total) by mouth two (2) times daily., Historical Med      clopidogrel 75 mg tablet Take 1 tablet (75 mg total) by mouth daily., Starting Wed 08/11/2023, Normal      ondansetron 4 mg tablet Take 1 tablet (4 mg total) by mouth every six (6) hours as needed for Nausea or Vomiting., Starting Wed 08/11/2023, Normal  STOP taking these medications       sodium sulfate-potassium sulfate-magnesium sulfate solution             Requested Appointments   We will schedule follow up appointment(s) and call you with appointment   time(s)   As directed      Is the patient being discharged to SNF? No  Clinic/Test/Procedure to be scheduled: PMD  Doctor Preferences (If applicable): Kelle Darting, MD  Time Frame: 1 week   Reason for Appointment/Procedure: Follow up hospitalization for abd pain    Clinic/Test/Procedure to be scheduled:GI  Doctor Preference (If applicable):1st avail  Time Frame: 2 weeks  Reason for Appointment/Procedure:post-hospitalization follow-up for   abdominal pain, eval for colonoscopy        Scheduled Appointments  Future Appointments   Date Time Provider Department Center   09/28/2023  1:00 PM Lala Lund., MD ANS [facility] PAIN [facility]          Discharge Physical Exam/Labs/Imaging/Procedures     Discharge Physical Exam:   BP 128/78 (Patient Position: Lying)  ~ Pulse 72  ~ Temp 36.6 ?C (97.9 ?F) (Oral)  ~ Resp 16  ~ Ht 1.676 m (5' 6'')  ~ Wt 50.3 kg (111 lb)  ~ SpO2 99%  ~ BMI 17.92 kg/m?   General appearance: alert and appears stated age  Neck: supple, symmetrical, trachea midline  Lungs: clear to auscultation bilaterally  Heart: regular rate and rhythm  Abdomen:  +BS, flat, prior surgical scars, mild diffuse tenderness  Extremities: extremities normal, atraumatic, no cyanosis or edema  Skin: Skin color, texture, turgor normal. No rashes or lesions  Neurologic: Grossly normal     Relevant labs:     CBC:  Lab Results   Component Value Date    WBC 5.52 09/26/2023    HGB 9.7 (L) 09/26/2023    HCT 31.0 (L) 09/26/2023    PLT 380 09/26/2023     LFT:  Lab Results   Component Value Date    ALBUMIN 4.3 09/24/2023    ALKPHOS 96 09/24/2023    AST 18 09/24/2023    ALT 11 09/24/2023       BMP:  Lab Results   Component Value Date    NA 137 09/26/2023    K 3.8 09/26/2023    CL 105 09/26/2023    CO2 22 09/26/2023    BUN 11 09/26/2023    CREAT 0.82 09/26/2023    CALCIUM 8.6 09/26/2023    GLUCOSE 85 09/26/2023       Cal/iCal/MG/Ph:  Lab Results   Component Value Date    CALCIUM 8.6 09/26/2023     Coags:  No results found for: ''PT'', ''APTT'', ''INR''                Relevant Imaging Studies (most recent results only):   Last CT Abdomen/Pelvis: CT abd+pelvis w contrast    Result Date: 09/24/2023  IMPRESSION: Redemonstrated severe atherosclerotic calcifications. No evidence of bowel wall thickening or vascular occlusion to suggest acute ischemia. Signed by: Vanessa Barbara   09/24/2023 9:49 AM     Procedures & Operations Performed:   None         Nutrition Recommendations & Malnutrition Assessment   I have seen and examined the patient and agree with the RD assessment detailed below:     Patient meets criteria for: Severe protein calorie malnutrition    (current weight 50.3 kg (111 lb), BMI (Calculated): 17.92;  ,  ).  See RD notes for additional details.        Level of Malnutrition: Severe protein calorie malnutrition    Discharge Diet Orders:  Regular: all foods are allowed   As directed        Skin/Wound         Discharge Activity Orders    Okay to be physically active   As directed      When getting up from bed, sit on the side of the bed for 1 minute before   standing up.  Then when you stand up wait a minute before trying to walk.   Taking your time getting up helps prevent dizziness   As directed        Rehab Assessment   No PT evaluation this encounter  No OT evaluation this encounter      Case Manager/Home Health Assessment   Discharge Information  Discharge Address: [address]   [place] [address] (09/26/23 0642)  Contact Name: [name] (09/26/23 0645)  Relationship: Daughter (09/26/23 0645)  Contact Number(s): [phone] (09/26/23 0645)  Support Systems: Family (09/26/23 0645)  Medicare Important Message Provided: Not Applicable (Medi-cal Assigned) (09/26/23 0645)  Medicare Outpatient Observation Notice (MOON) Provided: Not Applicable (Medi-cal Assigned) (09/26/23 0645)  Final Discharge Needs: No Needs (09/26/23 1112)  Is this patient eligible for ONclick?: No (09/26/23 1112)              Home Health Orders  There are no active discharge home health orders for this encounter.   Goals of Care Note (if new for this encounter)     I have seen and examined the patient on their discharge day. I have reviewed, edited, and agree with the above discharge summary. More than 30 minutes was personally spent on discharge planning on the day of discharge in coordination of care, counseling the patient and preparation of discharge.  Garnet Sierras, MD  09/26/2023 2:46 PM

## 2023-09-26 NOTE — Other
Patient's Clinical Goal:   Clinical Goal(s) for the Shift: VSS, promote safety and comfort, keep hemodynamically stable, monitor respiratory status  Identify possible barriers to advancing the care plan:   Stability of the patient: Moderately Unstable - medium risk of patient condition declining or worsening    Progression of Patient's Clinical Goal:     - Patient is alert and oriented x 4.  - IV intact and patent, saline lock.  - BMAT 4, refused bed alarm.  - On pain management oxycodone orally.  - Kept comfortable, monitor for nausea vomiting.  - Call light with in patient reach, bed in lowest position.  BOOST / SAFE TRANSITION - DISCHARGE INDICATORS    Indicator Check if indicator has red ''P''    Situation/Action/Comment     Problems with Medications  [x]     Psychological  []     Principal Diagnosis  []     Physical Limitations  []     Health Literacy  []     Patient Support  []     Prior Hospitalizations  [x]     Palliative Care  []       DELIRIUM PREVENTION  Protocols Strategies Check if implemented Comments   Risk factors >3 present- High risk []     Purposeful orientation Reorient, purposeful orienting conversation  Familiar objects in room []   []     Therapeutic activities Volunteer visit  Cognitive stimulation activities: games, reading, music []   []     Vision & hearing Assistance with: Leisure centre manager  Assistance with: hearing aids/hearing amplifier []   []     Feeding & hydration Assistance with feeding  Assistance with dentures []   []     Sleep hygiene Shades/blinds/lights on during day, limit naps during day  Quiet environment, consolidate care []     Mobilization BMAT 3-4: Ambulate TID  BMAT 2: OOB daily to chair for meals <= 2 hours, each time  BMAT 1: OOB to cardiac chair daily for meals <= 2 hours, each time [x]   []   []     Pain Non-narcotics ATC  Non-pharmacological: oil/aromatherapy, massage, music []   []     Maintain safety Fall precautions, volunteer visit, family at bedside, tele sitter, constant observer []     Manage agitation Redirect with calm, gentle voice and avoid confrontation  Avoid restraints and use alternative to restraints  Doll, music or animal therapy, as appropriate  Volunteer for companionship if safe and appropriate []   []   []   []       DELIRIUM: CAM (+)  Protocols Strategies Check if implemented Comments   New-onset MD contacted  Delirium order-set initiated  Bladder scan to R/O retention  Assess stool impaction  Medication reviewed with pharmacist []   []   []   []   []  MD Name:   Existing Manage and prevent further delirium [] 

## 2023-09-26 NOTE — Nursing Note
Received patient in bed awake, head of bed elevated,no sob or distress noted, on room air, breathing evenly and unlabored. IV intact and patent, saline lock, alert and oriented x 4.   Kept comfortable, wll continue to monitor accordingly. Bed alarm off, refused, call light with in patient reach, bed in lowest position.   1327 pm Patient discharged on 09/26/2023 at time of 1327  Patient discharged to home and transported by family  Vitals signs upon discharge were Blood pressure 128/78, pulse 72, temperature 36.6 ?C (97.9 ?F), temperature source Oral, resp. rate 16, height 1.676 m (5' 6''), weight 50.3 kg (111 lb), SpO2 99%.  Skin condition intact  Patient oriented x4    Discharge instructions/AVS and medication information given to patient  Patient education and medication education completed with patient  patient aware that the patient does not have new medications to be picked up from outside pharmacy  Patient had no home medications that needed to be returned to the patient from the hospital pharmacy.     Discharge orders include do not include home health care.  Patient has no DME needs  All patient's belongings sent with patient. Patient left in stable condition accompanied by daughter Lawerance Cruel in no distress.

## 2023-09-28 ENCOUNTER — Ambulatory Visit: Payer: PRIVATE HEALTH INSURANCE

## 2023-09-28 LAB — Copper, Plasma: COPPER, SERUM OR PLASMA: 70.5 ug/dL — ABNORMAL LOW (ref 80.0–155.0)

## 2023-09-29 ENCOUNTER — Telehealth: Payer: PRIVATE HEALTH INSURANCE

## 2023-09-29 NOTE — Telephone Encounter
Discharge Followup Appointment Info      Date of Discharge: 09/26/2023    Discharge Disposition: Home or Self Care   TIme Frame: Within 7 Days    Sched with PCP?: Outside PCP PCP Name: Kelle Darting, MD   Date and Time of appt: 09/30/23  1:15 PM PDT    Was Appt Confirmed?: Pos    Method of Appt Confirmation: Confirmed via phone directly with patient/family    Barriers to Scheduling: None    Did you schedule any Specialty visits?: Yes    Additional Appts/Tests/Info Future appointments  01/05/2024 at 4:00 PM Dr. Carollee Massed  - Gastroenterology  [address]  [place], [place] [address]  Phone: [phone]  Fax: [phone]              Confirmed with patient at [phone]  Appt letters will be mailed out     Is the patient being discharged to SNF? No  Clinic/Test/Procedure to be scheduled: PMD  Doctor Preferences (If applicable): Kelle Darting, MD  Time Frame: 1 week  Reason for Appointment/Procedure: Follow up hospitalization for abd pain  Clinic/Test/Procedure to be scheduled:GI  Doctor Preference (If applicable):1st avail  Time Frame: 2 weeks  Reason for Appointment/Procedure:post-hospitalization follow-up for abdominal pain, eval for colonoscopy            [facility]

## 2023-10-06 IMAGING — CR XR FOOT 3+ VIEWS LEFT
1 series · 3 of 3 positions shown · non-contrast
Comparison: None available.

Left medial foot pain had bad muscle cramps in foot, pain since,r/o fx
FINAL REPORT:
XR FOOT 3+ VIEWS LEFT
INDICATION: Pain in left foot

[Series 3536: AP · left · 0.20mm/px · 3 of 3 slices shown]
[im 1/3]
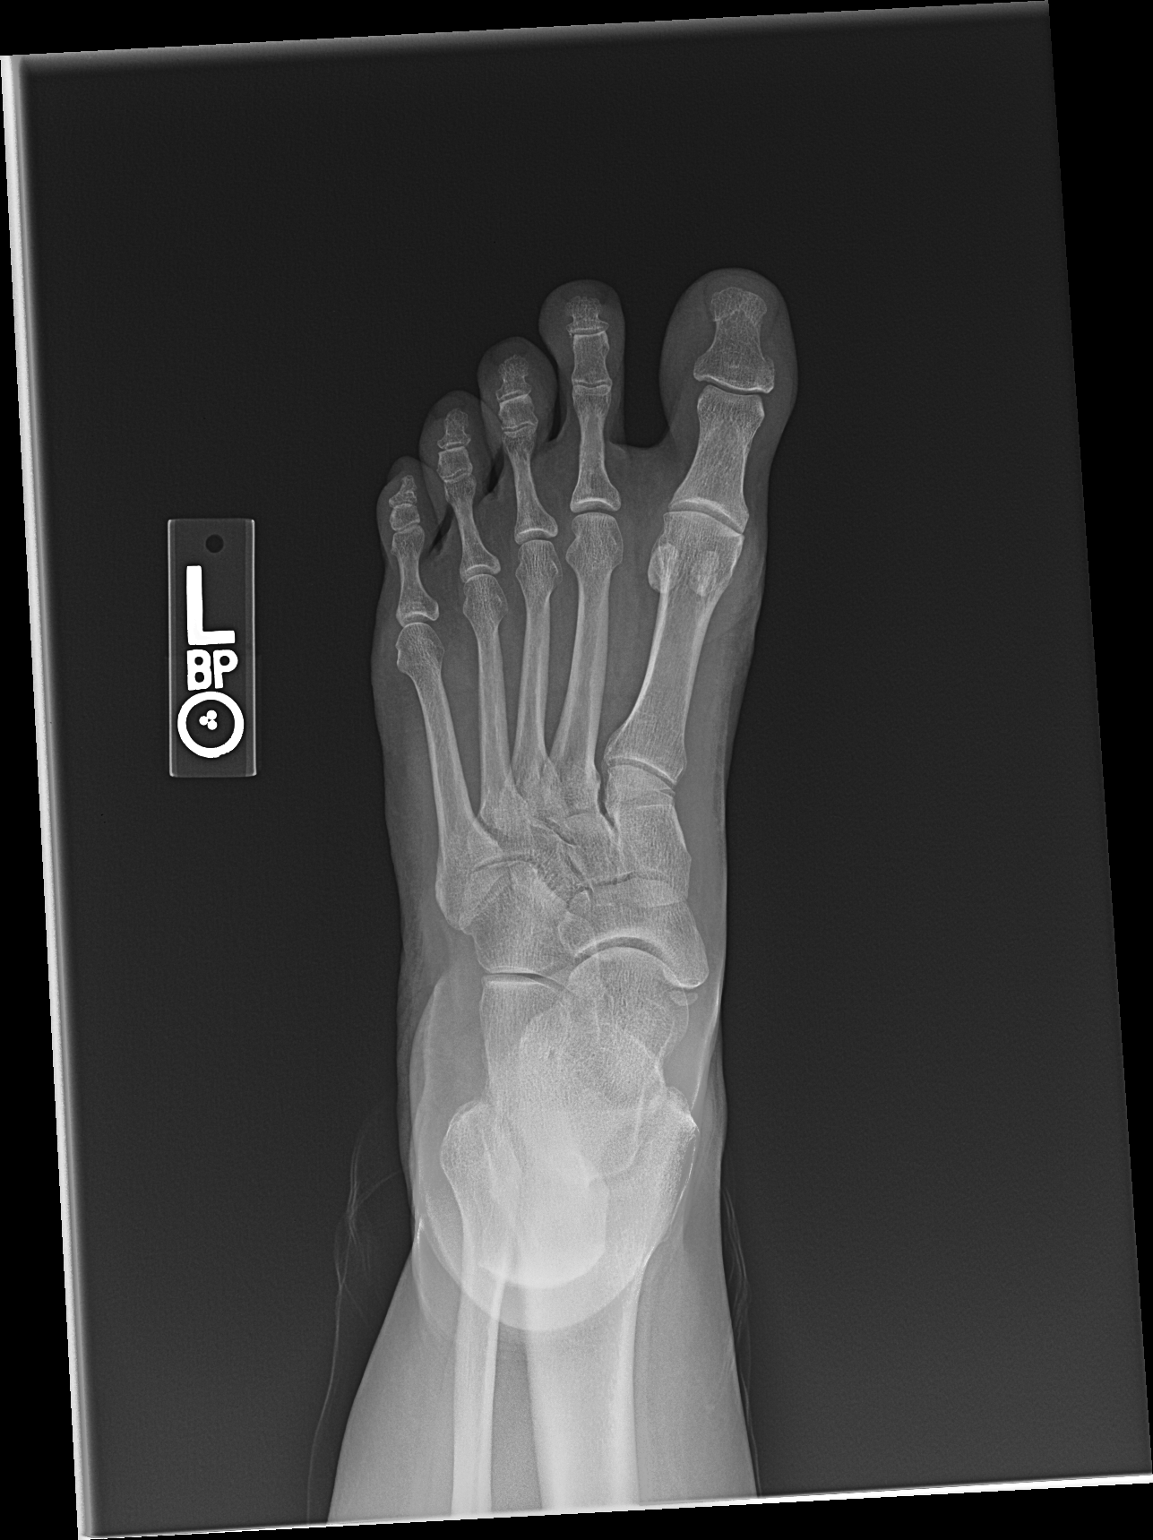
[im 2/3]
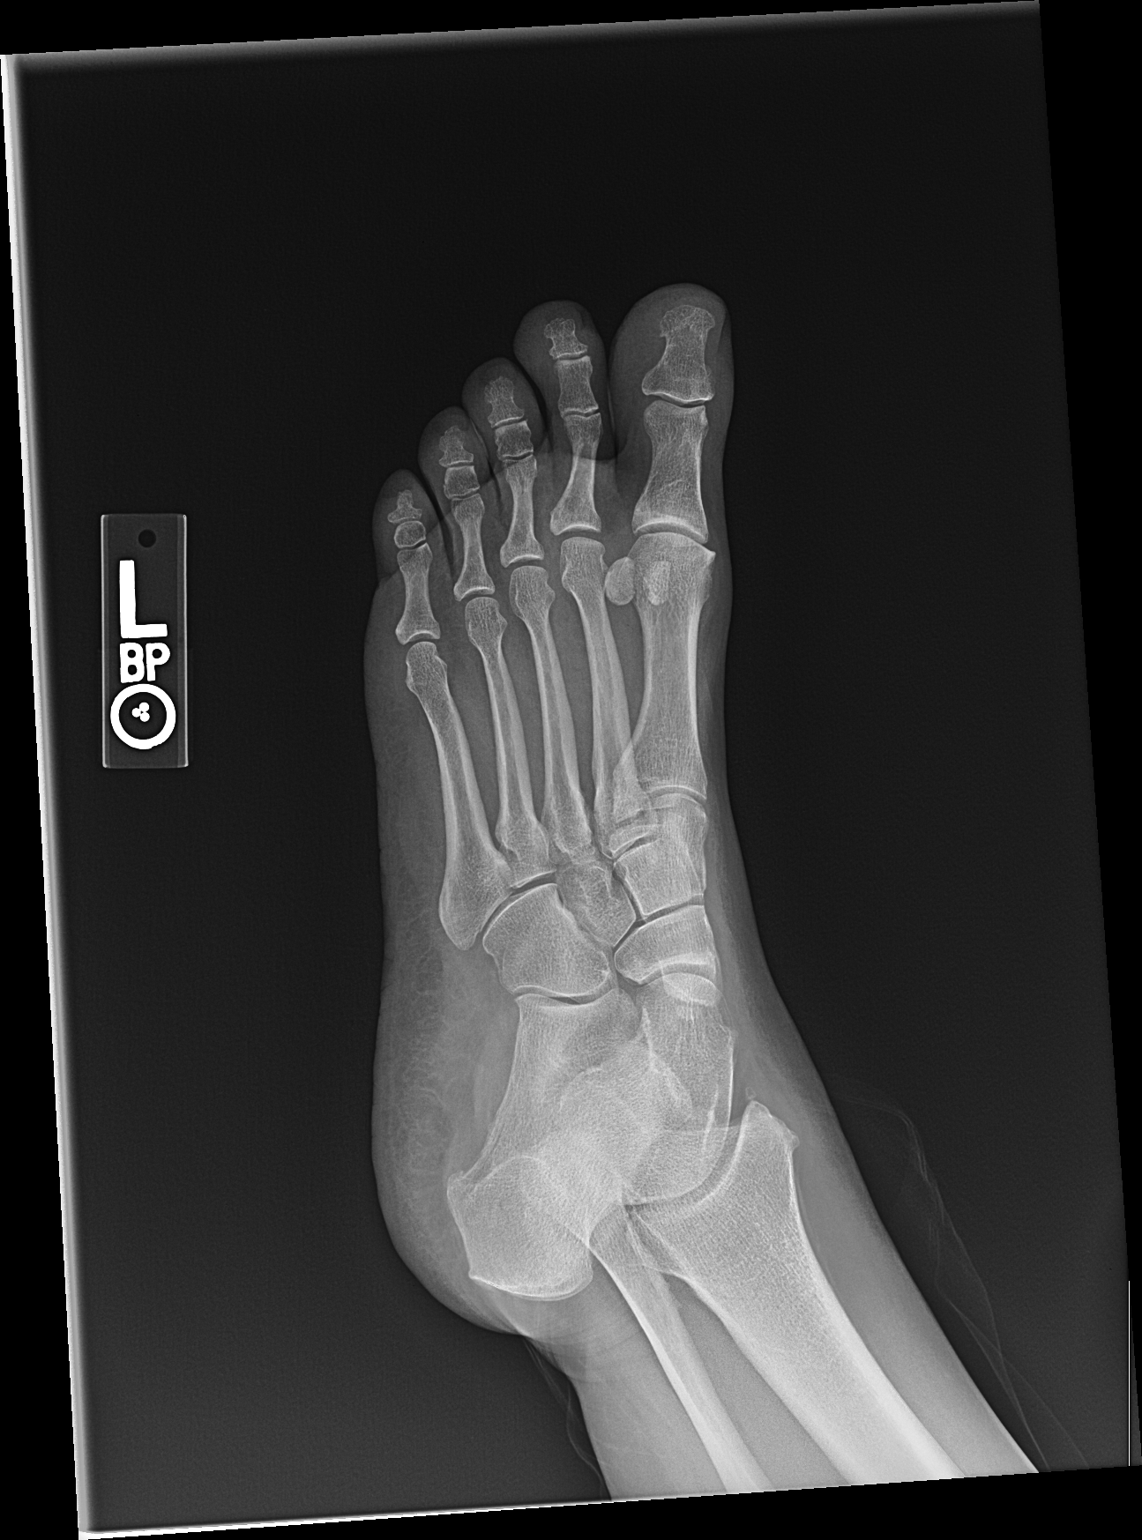
[im 3/3]
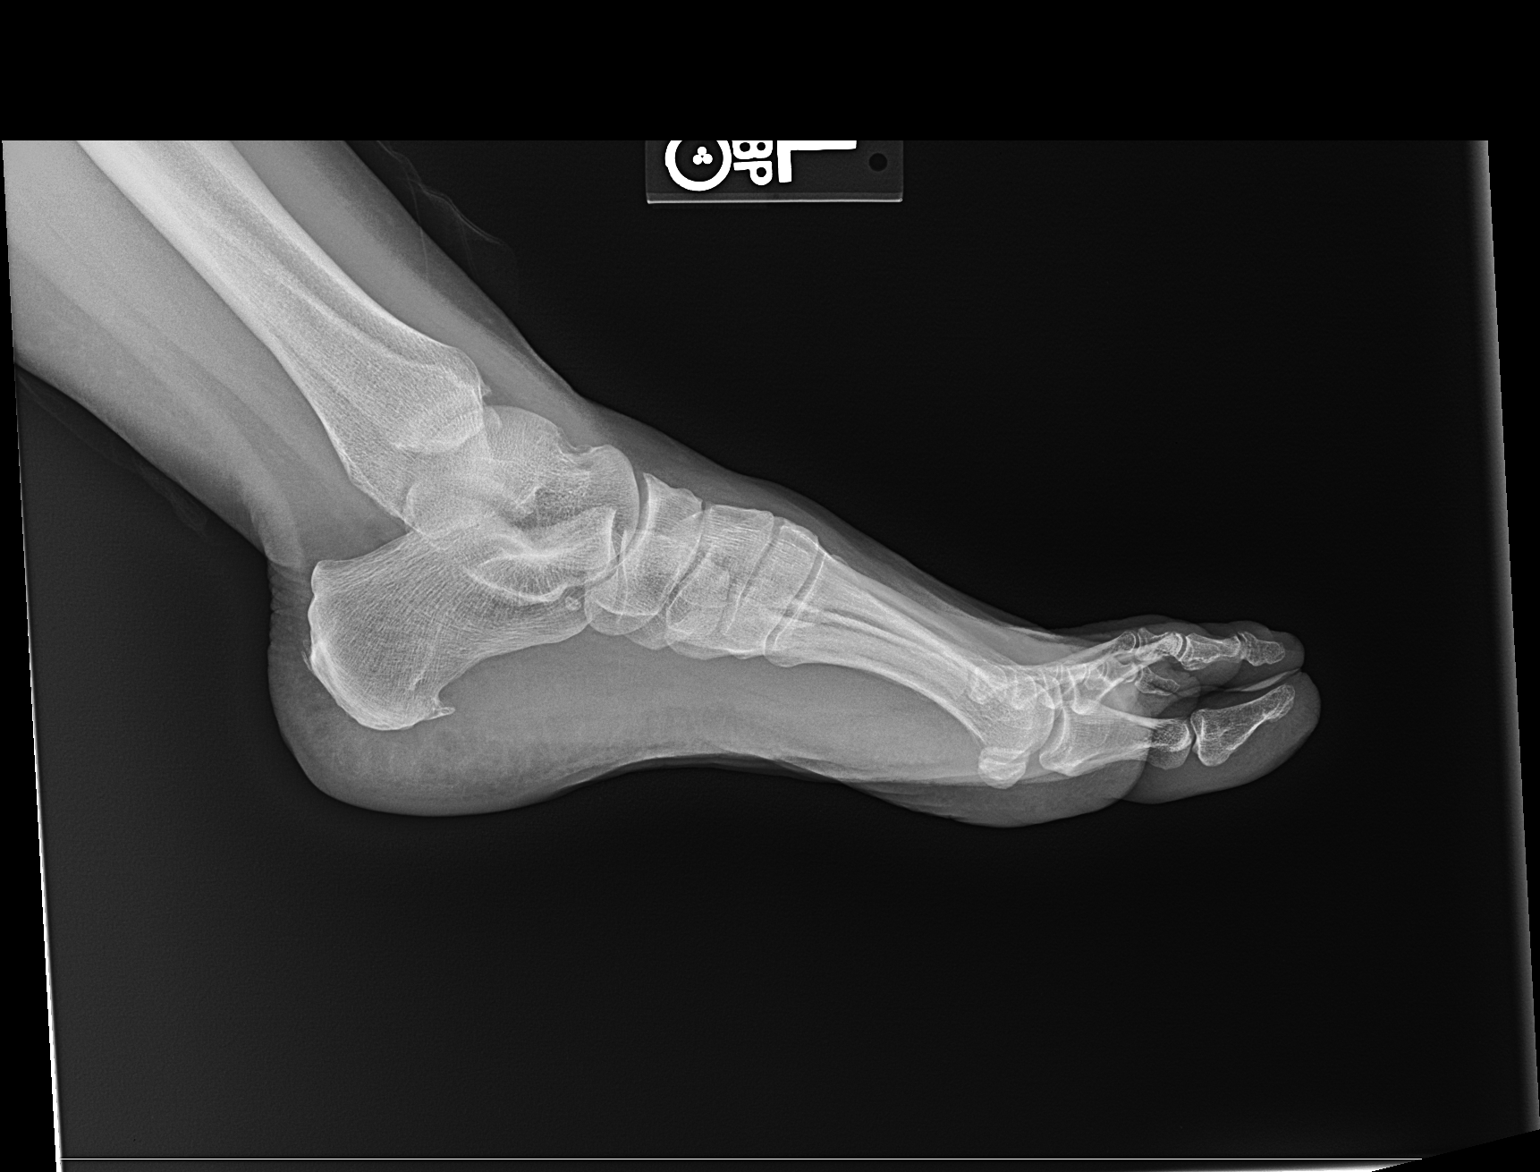

[3 of 3 positions shown; findings below may reference images not displayed]

FINDINGS: 3 views were obtained.
No acute fracture or malalignment. Joint spaces are maintained. No soft tissue swelling, radiopaque foreign body or gas. There is a plantar calcaneal spur.
IMPRESSION: No acute osseous abnormality.
Is the patient pregnant?
No

## 2023-10-24 MED ORDER — ASPIRIN LOW DOSE 81 MG PO TBEC
81 mg | ORAL_TABLET | Freq: Every day | ORAL | 1 refills
Start: 2023-10-24 — End: ?

## 2023-10-26 ENCOUNTER — Inpatient Hospital Stay
Admit: 2023-10-26 | Discharge: 2023-10-26 | Disposition: A | Discharge status: Home / Self Care | Payer: PRIVATE HEALTH INSURANCE | Source: Home / Self Care | Attending: Student in an Organized Health Care Education/Training Program

## 2023-10-26 DIAGNOSIS — R109 Unspecified abdominal pain: Secondary | ICD-10-CM

## 2023-10-26 MED ADMIN — OXYCODONE HCL 5 MG PO TABS: 10 mg | ORAL | @ 16:00:00 | Stop: 2023-10-26 | NDC 68084035411

## 2023-10-26 MED ADMIN — ACETAMINOPHEN 500 MG PO TABS: 1000 mg | ORAL | @ 16:00:00 | Stop: 2023-10-26 | NDC 00904673061

## 2023-10-26 NOTE — ED Notes
[facility] NOTIFICATION 10/26/2023 06:46 Nakema, Brunette DOB: December 19, 1966 MRN: [id number]    [name]:   YQM:[id number]  Account [id number]  Billing Account [id number]      Criteria Met      6 Visits in 180 Days    History of Sepsis Dx    Security and Safety  No Security Events were found.  ED Care Guidelines  There are currently no ED Care Guidelines for this patient. Please check your facility's medical records system.    Flags      History of Sepsis - Patient has received a diagnosis of Sepsis from an acute or post-acute setting. Apply appropriate clinical planning practices; to learn more visit [email or web address] / Attributed By: [employer] / Attributed On: 05/20/2023       [facility] enrolled with [facility] - Patient is currently enrolled in the Medi-Cal Enhanced Care Management Program with [facility]. Please reach out between the hours of 08:00 am and 05:00 pm Monday-Friday at [phone] to coordinate care. / Attributed By: [facility] [employer] / Attributed On: 09/03/2023       Prescription Drug Data  No Prescription Drug Data was found.    E.D. Visit Count (12 mo.)  Facility Visits   [facility] 4   [address] [facility] 2   [facility] 2   Total 8   Note: Visits indicate total known visits.     Recent Emergency Department Visit Summary  Date Facility [facility] Type Diagnoses or Chief Complaint    Oct 26, 2023  [facility]  [place]     Sep 24, 2023  [facility]  [place]      1. Unspecified abdominal pain      1. Abdominal Pain      2. Hypovolemia      Sep 12, 2023  [name]  [place]gency  Chief Complaint: ABD PAIN/ VOMITTING / DIARRHEA    Aug 26, 2023  [facility].  [place]      1. Nausea with vomiting, unspecified      1. Unspecified abdominal pain      1. Abdominal Pain      3. Other chronic pain      4. Abnormal weight loss 5. Adult failure to thrive      Aug 10, 2023  [facility].  [place]      1. Unspecified abdominal pain      1. Abdominal Pain      2. Abnormal weight loss      3. Diarrhea, unspecified      May 22, 2023  [name]  [place]  Chief Complaint: CP/ABD PAIN    May 21, 2023  [name]  [place]      Dysphagia, unspecified      Chronic obstructive pulmonary disease, unspecified      Gastritis, unspecified, without bleeding      Heart failure, unspecified      Other long term (current) drug therapy      Major depressive disorder, single episode, unspecified      Acquired absence of other specified parts of digestive tract      Anxiety disorder, unspecified      Weakness  Anemia, unspecified      May 19, 2023  [name]  [place]gency  Chief Complaint: SOB      Recent Inpatient Visit Summary  Date Facility [facility] Type Diagnoses or Chief Complaint    Aug 28, 2023  [facility]  [place]  XRay      1. Abnormal weight loss      2. Nicotine dependence, unspecified, uncomplicated      3. Unspecified abdominal pain      4. Other chronic pain      5. Adult failure to thrive      6. Nausea with vomiting, unspecified      May 22, 2023  [name]  [place]  Medical Surgical  Chief Complaint: Colitis    May 19, 2023  [name]  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  [place]  Cath Lab      1. Generalized abdominal pain      1. Noninfective gastroenteritis and colitis, unspecified      2. Other specified postprocedural states      3. Vascular disorder of intestine, unspecified      4. Tobacco use      5. Diarrhea, unspecified      6. Nausea with vomiting, unspecified      8. Chronic vascular disorders of intestine      9. Unspecified severe protein-calorie malnutrition      10. Anemia, unspecified      Apr 23, 2023  [facility]  [place]  Family Practice      1. Noninfective gastroenteritis and colitis, unspecified      1. Generalized abdominal pain      2. Other specified postprocedural states      3. Vascular disorder of intestine, unspecified      4. Tobacco use      5. Diarrhea, unspecified      6. Nausea with vomiting, unspecified      8. Chronic vascular disorders of intestine      9. Unspecified severe protein-calorie malnutrition      10. Anemia, unspecified      Feb 02, 2023  [facility].  [place]  CT Scan      1. Dehydration      2. Generalized abdominal pain      3. Abnormal weight loss      4. Unspecified abdominal pain      5. Acute kidney failure, unspecified      6. Diarrhea, unspecified      7. Nausea with vomiting, unspecified        Care Team  Ellianna Ruest Specialty Phone Fax Service Dates   PEREZ, [name], M.D. Family Medicine   Current      [facility]  This patient has registered at  the [facility] Emergency Department  For more information visit: [email or web address]   PLEASE NOTE:     1.   Any care recommendations and other clinical information are provided as guidelines or for historical purposes only, and providers should exercise their own clinical judgment when providing care.    2.   You may only use this information for purposes of treatment, payment or health care operations activities, and subject to the limitations of applicable [facility] Policies.    3.   You should consult directly with the organization that provided a care guideline or other clinical history with any questions about additional information or accuracy or completeness of information provided.    ? 2024 [facility] - [email or web address]

## 2023-10-26 NOTE — Discharge Instructions
You were seen in the emergency department for abdominal pain.  Because you have had 10 CT scans this year to we did not repeat a CT scan today.  Because you had reaction to IV Dilaudid in September, we did not give you IV Dilaudid for your chronic pain.  The next step for your chronic abdominal pain as to see a GI specialist.  We will have our case manager call to see if they can expedite an appointment with GI for you.  Otherwise follow up with your primary care provider this week and ask your primary care provider to provide you for referral to GI.

## 2023-10-26 NOTE — ED Provider Notes
[facility]  Emergency Department Service Report    Donna Gross 57 y.o. female , presents with Abdominal Pain    Triage   Arrived on 10/26/2023 at 6:46 AM   Arrived by Car [5]    ED Triage Vitals   Temp Temp Source BP Heart Rate Resp SpO2 O2 Device Pain Score Weight   10/26/23 0648 10/26/23 0648 10/26/23 0648 10/26/23 0648 10/26/23 0648 10/26/23 0648 10/26/23 0648 10/26/23 0649 10/26/23 0649   36.9 ?C (98.4 ?F) Oral 139/86 87 16 99 % None (Room air) Ten 46.4 kg (102 lb 4.7 oz)       Pre hospital care:       Allergies   Allergen Reactions    Hydrocodone Other (See Comments)     ''burns a hole in stomach''  Tolerates hydromorphone    Ibuprofen Other (See Comments)     GI discomfort    ''hurts my stomach''    Morphine Itching     Tolerates hydromorphone       History   HPI   Donna Gross is a 57 y.o. female with a history of anxiety, depression, emphysema, fibromyalgia, gastritis, GERD, blood transfusion, and pancreatitis presenting with abdominal and back pain. She also endorsed vomiting and diarrhea multiple times 1x day ago. She denies recent fever/chills, cough, chest pain, shortness of breath, urinary or bowel changes, dysuria, increased frequency, diarrhea, or other infectious symptoms.       Past Medical History:   Diagnosis Date    Anxiety     Depression     Emphysema, unspecified (HCC/RAF)     Emphysema, unspecified (HCC/RAF)     Fibromyalgia     Gastritis     GERD (gastroesophageal reflux disease)     History of blood transfusion     Intractable nausea and vomiting 01/23/2020    Pancreatitis         Past Surgical History:   Procedure Laterality Date    ABDOMINAL SURGERY      COLON SURGERY          Past Family History   family history includes Diabetes in her brother and father; Heart disease in her mother.                 Past Social History   she reports that she has been smoking cigarettes. She uses smokeless tobacco. She reports that she does not drink alcohol and does not use drugs. No history on file for sexual activity.       Physical Exam   Physical Exam  Vitals and nursing note reviewed.   Constitutional:       General: She is not in acute distress.  HENT:      Head: Atraumatic.      Mouth/Throat:      Mouth: Mucous membranes are moist.   Eyes:      Conjunctiva/sclera: Conjunctivae normal.   Pulmonary:      Effort: Pulmonary effort is normal. No tachypnea, bradypnea or respiratory distress.   Abdominal:      General: There is no distension.      Comments: (+) Diffuse abdominal tenderness with no rebound or guarding.   Musculoskeletal:         General: Normal range of motion.   Skin:     Findings: No rash.   Neurological:      General: No focal deficit present.      Mental Status: She is alert. She is not disoriented.  Motor: Motor function is intact.      Comments: Moving all four extremities.   Psychiatric:         Behavior: Behavior normal.         ED Course          Laboratory Results   Labs Reviewed - No data to display    Imaging Results     No orders to display       Administered Medications     Medication Administration from 10/26/2023 0646 to 10/26/2023 1444         Date/Time Order Dose Route Action Action by Comments     10/26/2023 0755 PST oxyCODONE tab 10 mg 10 mg Oral Given Sheriff, Juline Patch, [place] --     10/26/2023 2542 PST acetaminophen tab 1,000 mg 1,000 mg Oral Given Sheriff, Juline Patch, RN --                       Procedures   Procedural Sedation  Procedures    Medical Decision Making   Donna Gross is a 57 y.o. female with a history of anxiety, depression, emphysema, fibromyalgia, gastritis, GERD, blood transfusion, and pancreatisis presenting with abdominal and back pain.  On arrival to the emergency department, patient is in no distress.  Initial triage vitals are within normal limits.  On exam patient has mild diffuse abdominal tenderness with no focal pain and no rebound or guarding.  According to note review, patient developed respiratory distress after last dose of IV Dilaudid in September.  I discussed with patient a pain plan from her last discharge summary which included lidocaine, IV Tylenol, Bentyl, but patient declined all recommendations because she states that none of these medications help her with her abdominal pain.  She states only IV Dilaudid would help her with her abdominal pain and added that the last time she had respiratory distress after IV Dilaudid was because she received both IV Dilaudid and p.o. oxycodone.  Patient has had 10 CT scans in 2024 and I do not believe any additional CT is indicated today and in fact could be harmful due to radiation exposure.  I spent time at the bedside discussing with patient that we can not chronic pain with IV Dilaudid, advised her to follow up with GI specialist.  Patient was frustrated by lack of administration of IV pain medications in the emergency department but I did offer p.o. oxycodone.  Patient was advised to follow up with GI specialist, I spoke to the case manager to assist with making an appointment with GI for the patient.  Patient advised to follow up this ED visit with primary care provider within 1 week and return to the emergency department for any new concerning symptoms.  Patient discharged in stable condition.      Medical Decision Making    Launch MDCalc MDM Tool   MDCalc MDM Module  Oct 26 2023 2:43 PM [facility] [place]]  Problems: Abdominal pain, acute  Risk: oxyCODONE tablet (Rx drug management)                  Problems  Clinical Impressions Complexity of problems addressed         Abdominal pain, acute (Primary) High:  [x]  Acute/chronic illness/injury with threat to life to bodily function  []  Chronic illness with severe exacerbation, progression, or side effects of treatment    Moderate:  []  Undiagnosed new problem with uncertain prognosis  []  Acute illness  with systemic symptoms  []  Acute complicated injury    Data and Risk  Independent Historian []  Parent as child too young to provide hx []  Family/Caregiver due to AMS/dementia []  EMS due to medical acuity/trauma []  Family/EMS due to behavioral health concern and for collateral []    External Data Reviewed previous workup and mgmt of patient's Abdominal Pain via  []  Previous [place] Notes/Labs/Imaging []  External Notes/Labs/Imaging  which were non-contributory unless documented otherwise in HPI and ED Course   Considered but decided against  []  CT Head/C-spine given [place] CT/NEXUS/PECARN criteria []  CTA Chest to r/o PE given PERC/Well's criteria []  CT AP to r/o appendicitis given PAS score or family discussion []  Hospitalization due to *  []    Discussed w/ ext HCP []  Consults, PCP/outpt specialists, nursing home. See ED Course for details.   SDOH Affecting Dx/Tx []  Insurance limiting specialist referral []  Housing instability limiting outpt mgmt   []  Financial insecurity limiting medication access []  Substance/ETOH use []    Care de-escalation []  Shared decision-making regarding de-escalation of care (e.x. DNR) or foregoing hospitalization-level of care due to patient wishes/goals of care   Interpretations See ED Course. []     If applicable, parenteral controlled substances, drug therapies requiring intensive monitoring for toxicity, and prescription drug management are documented in the the Medications section of this note. If applicable, major and minor procedures are documented separately in Procedure Notes.    Clinical Impression           Abdominal pain, acute (Primary)      Prescriptions     Discharge Medication List as of 10/26/2023  7:50 AM          Disposition and Follow-up   Disposition:  Discharge [1]     No future appointments.    Follow up with:  Kelle Darting, MD  [address]  [place] [address]  [phone]    In 1 week      [facility] Emergency Department  [address]  [place] [address]  [phone]    If symptoms worsen      Return precautions are specified on After Visit Summary.    Scribe Signature   I, Franchot Mimes, have acted as a Stage manager for [employer] on behalf of Dr. Trisha Mangle on 10/26/2023 at 7:41 AM. All documentation underwent a comprehensive review by the listed physician(s) and received their approval upon signing.    Physician Signature(s)     I have reviewed this note as recorded by scribe who is named above. He/She  acted as medical scribe,and I attest that it is an accurate representation of my H&P and other events of the ED visit except as otherwise noted.                   Varney Baas., MD  10/26/23 1444

## 2024-02-03 IMAGING — CR XR CHEST 2 VIEWS
1 series · 2 of 2 positions shown · non-contrast
Comparison: 12/13/2019

Sob on methotrexate
FINAL REPORT:
HISTORY: Long-term drug therapy with shortness of
Number of views:2

[Series 8700: PA · 0.19mm/px · 2 of 2 slices shown]
[im 1/2]
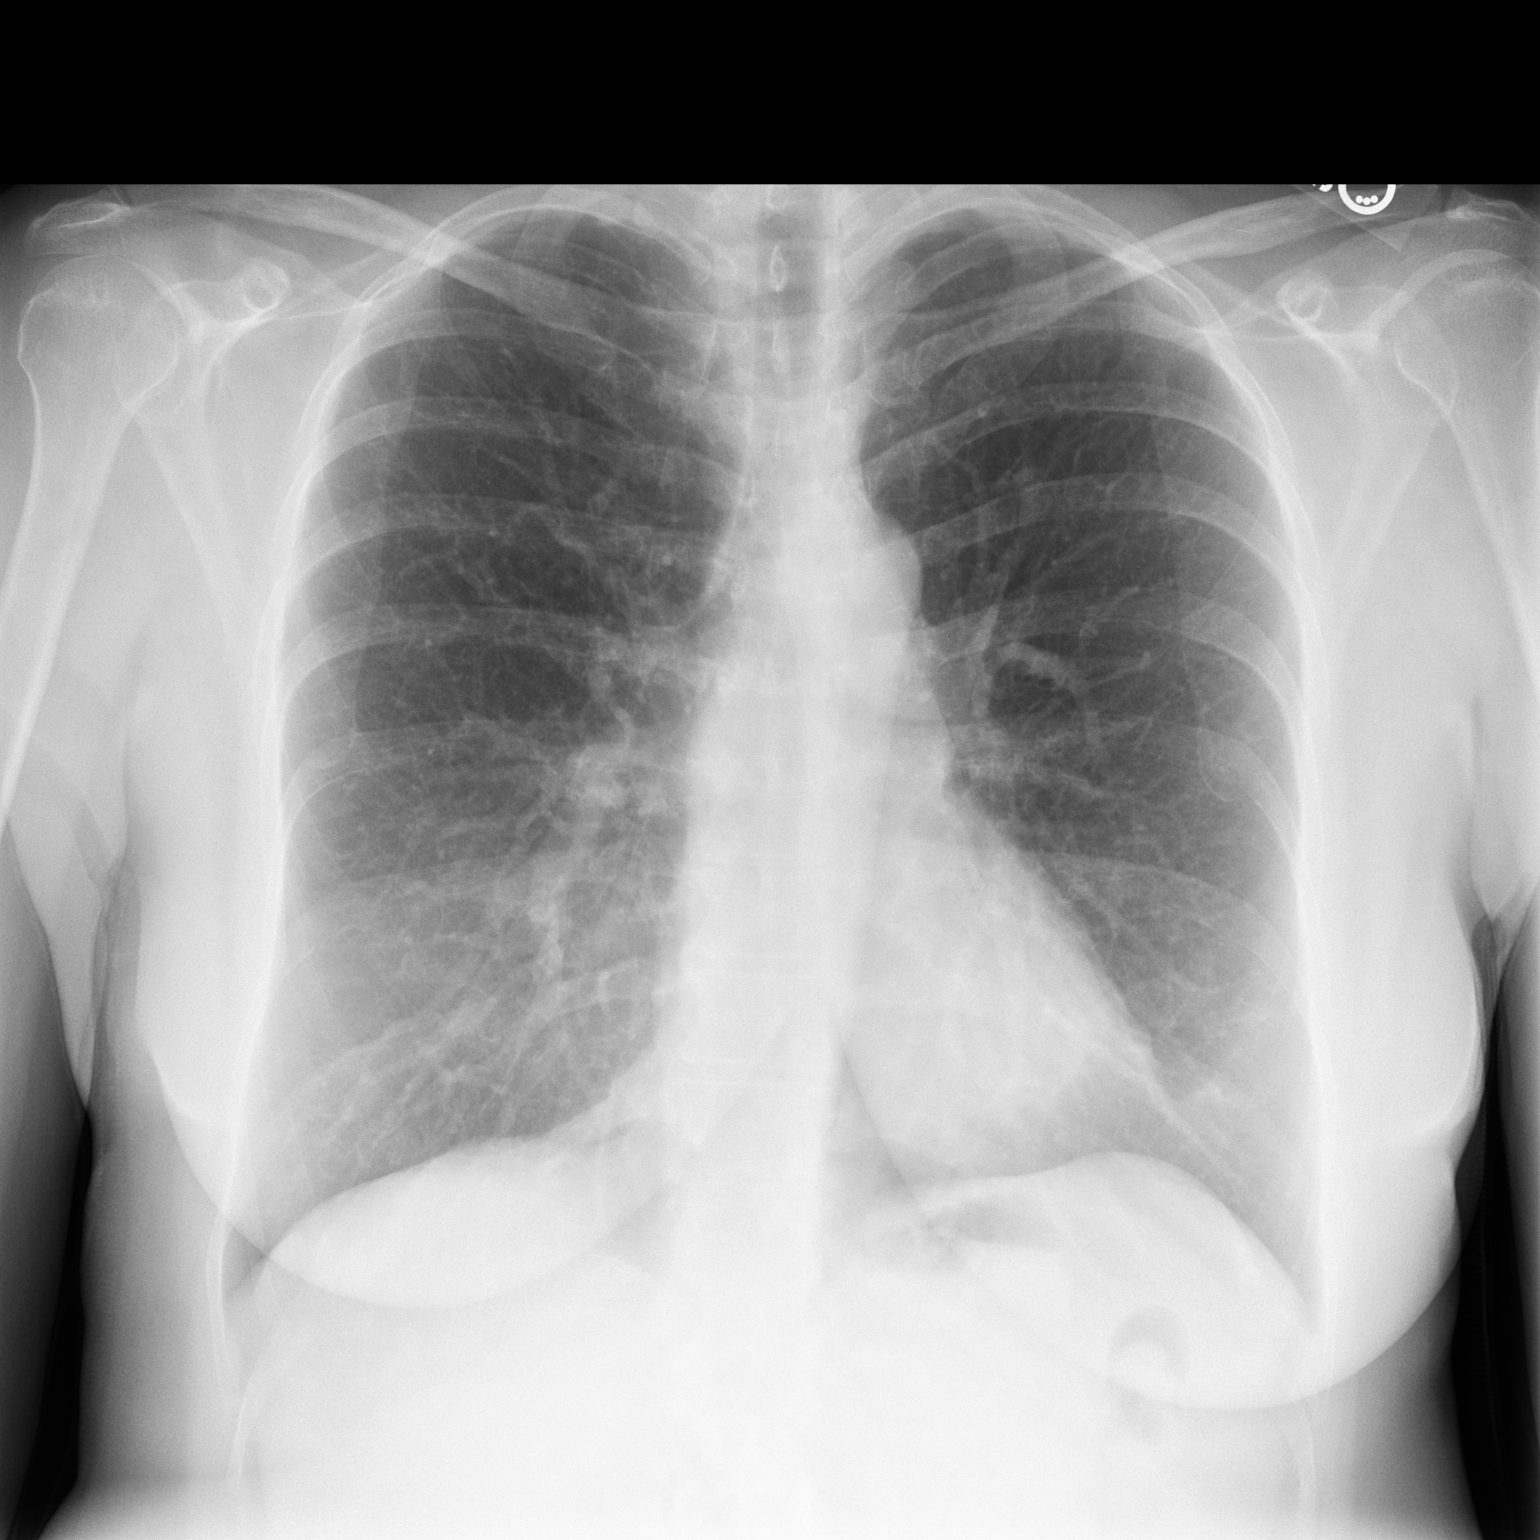
[im 2/2]
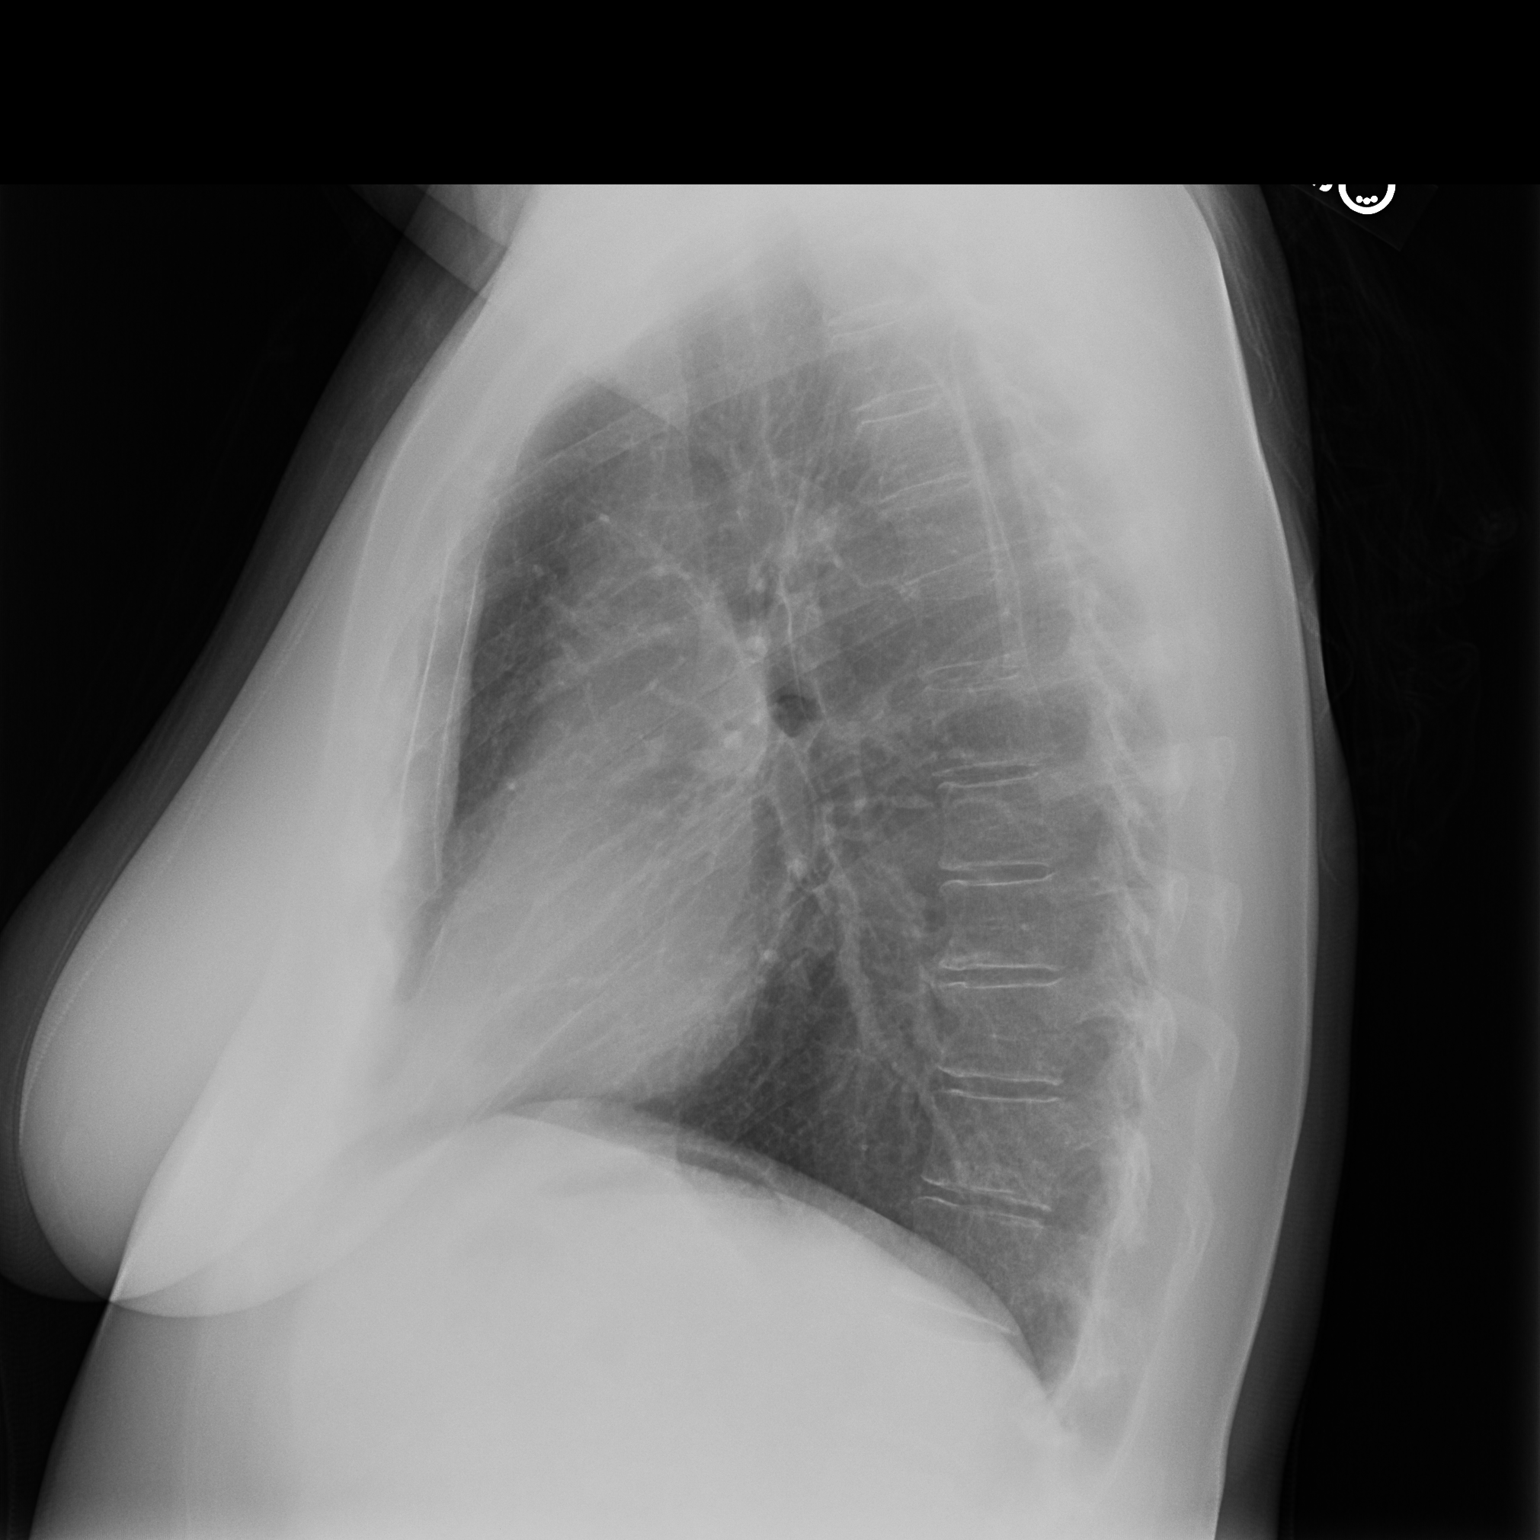

[2 of 2 positions shown; findings below may reference images not displayed]

FINDINGS: Cardiomediastinal contour is stable. No aggressive bone lesions are seen. Lungs are symmetrically aerated and clear.
IMPRESSION: 
IMPRESSION: No interval change or acute findings
Is the patient pregnant?
Unknown

## 2024-07-10 IMAGING — CR XR CHEST 1 VIEW
1 series · 1 of 1 positions shown · non-contrast
Comparison: None provided.
COMPARISON: None provided.

------------- REPORT [id number] -------------
Dyspnea
FINAL REPORT:
EXAM:
CR Chest, 1  View.
CLINICAL HISTORY: Dyspnea dyspnea

[AP]
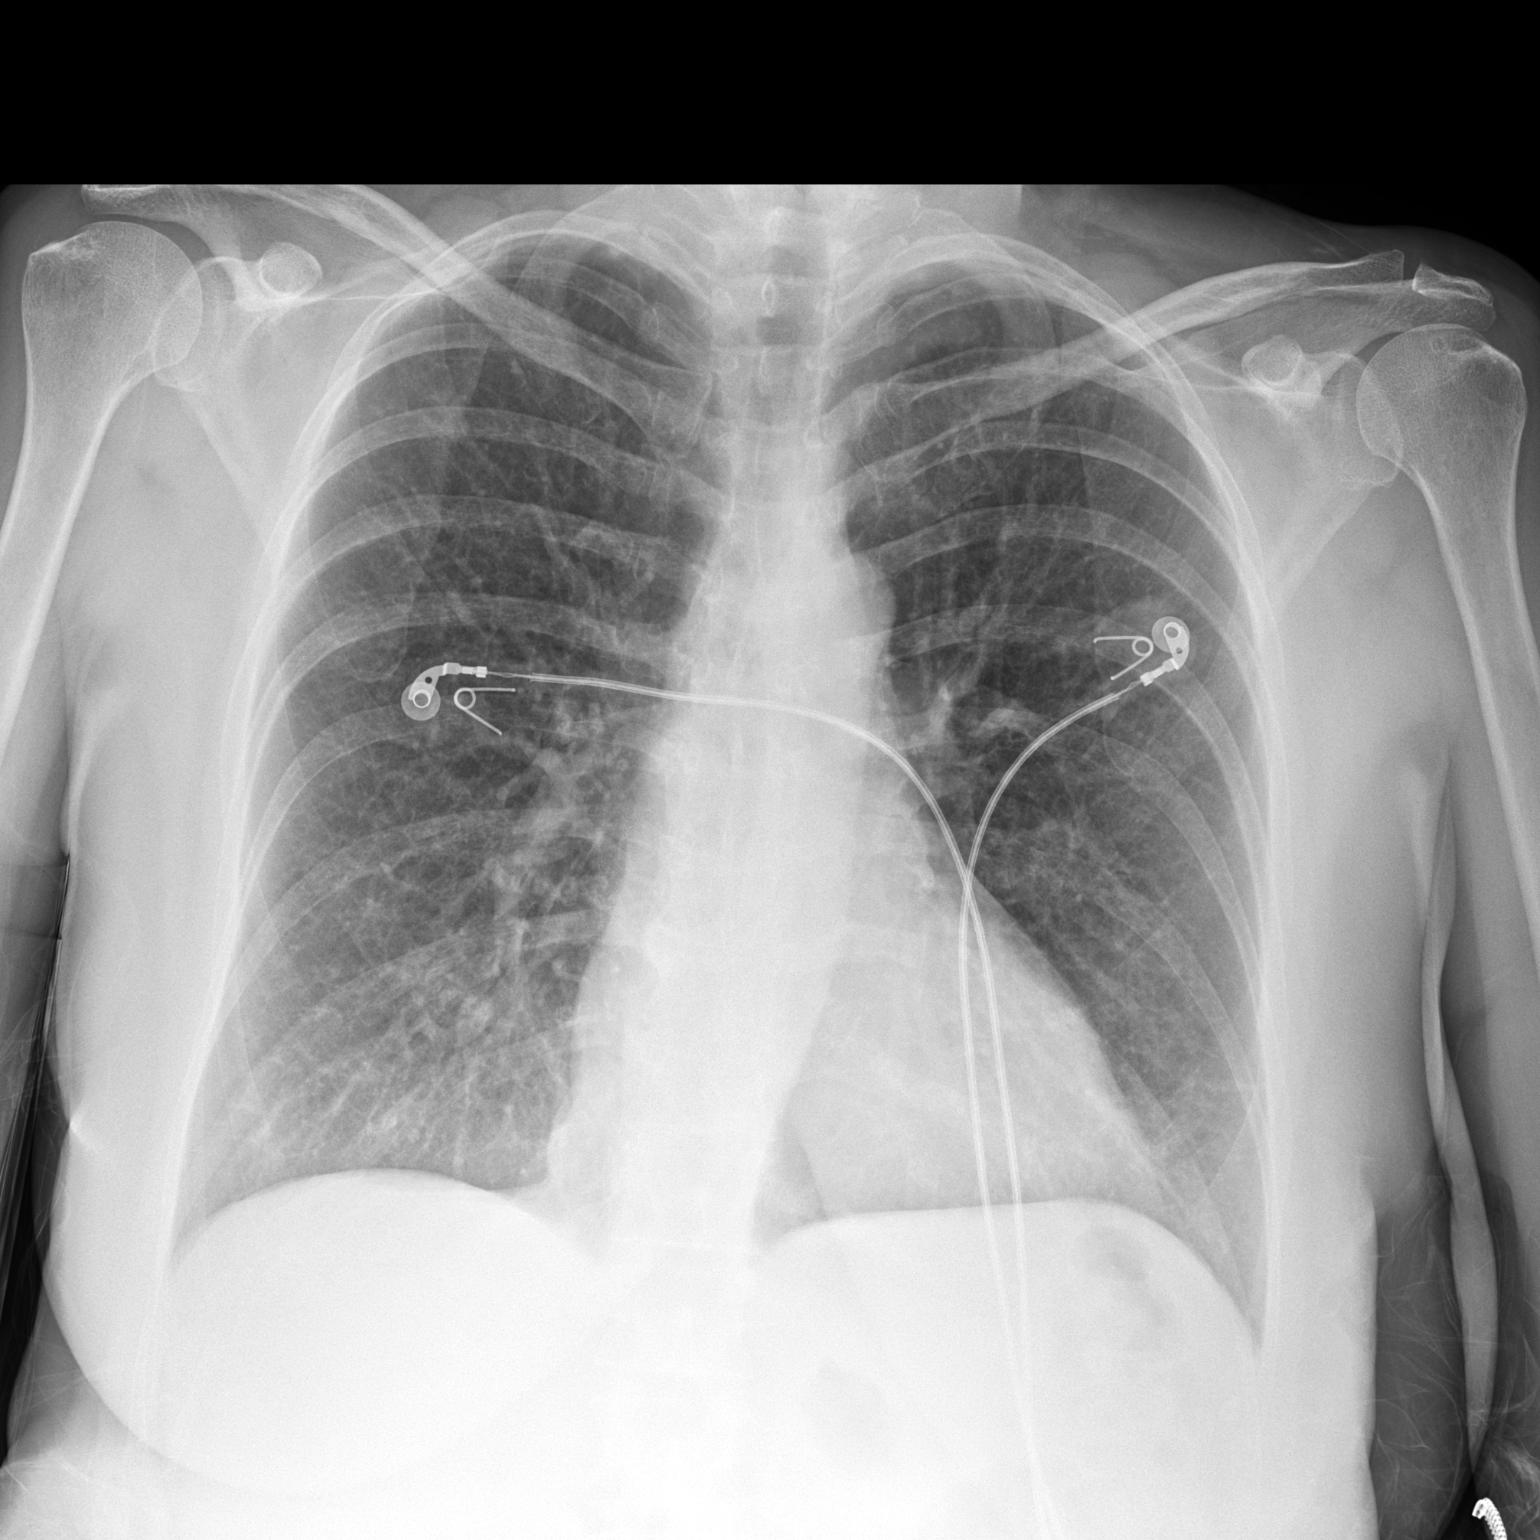

[1 of 1 positions shown; findings below may reference images not displayed]

FINDINGS: LUNGS:
No consolidation
PLEURAL SPACES:
No pleural effusion or pneumothorax
MEDIASTINUM:
Cardiac size and mediastinal contours within normal limits
BONES:
No acute osseous abnormality
IMPRESSION: No acute cardiopulmonary process
Is the patient pregnant?
No

------------- REPORT [id number] -------------
Dyspnea
FINAL REPORT:
EXAM:
CR Chest, 1  View.
FINDINGS: LUNGS:
No consolidation
PLEURAL SPACES:
No pleural effusion or pneumothorax
MEDIASTINUM:
Cardiac size and mediastinal contours within normal limits
BONES:
No acute osseous abnormality
IMPRESSION: No acute cardiopulmonary process
Is the patient pregnant?
No
